# Patient Record
Sex: Female | Born: 1935
Health system: Southern US, Community
[De-identification: ages and names within clinical notes are randomized; demographics above are authoritative.]

## PROBLEM LIST (undated history)

## (undated) DIAGNOSIS — Z9081 Acquired absence of spleen: Secondary | ICD-10-CM

## (undated) DIAGNOSIS — I700 Atherosclerosis of aorta: Secondary | ICD-10-CM

## (undated) DIAGNOSIS — E669 Obesity, unspecified: Secondary | ICD-10-CM

## (undated) DIAGNOSIS — D473 Essential (hemorrhagic) thrombocythemia: Secondary | ICD-10-CM

## (undated) DIAGNOSIS — H8109 Meniere's disease, unspecified ear: Secondary | ICD-10-CM

## (undated) DIAGNOSIS — F329 Major depressive disorder, single episode, unspecified: Secondary | ICD-10-CM

## (undated) DIAGNOSIS — C8307 Small cell B-cell lymphoma, spleen: Secondary | ICD-10-CM

## (undated) DIAGNOSIS — T4145XA Adverse effect of unspecified anesthetic, initial encounter: Secondary | ICD-10-CM

## (undated) DIAGNOSIS — C8590 Non-Hodgkin lymphoma, unspecified, unspecified site: Secondary | ICD-10-CM

## (undated) DIAGNOSIS — I5020 Unspecified systolic (congestive) heart failure: Secondary | ICD-10-CM

## (undated) DIAGNOSIS — I739 Peripheral vascular disease, unspecified: Secondary | ICD-10-CM

## (undated) DIAGNOSIS — I10 Essential (primary) hypertension: Secondary | ICD-10-CM

## (undated) DIAGNOSIS — T8859XA Other complications of anesthesia, initial encounter: Secondary | ICD-10-CM

## (undated) DIAGNOSIS — D509 Iron deficiency anemia, unspecified: Secondary | ICD-10-CM

## (undated) DIAGNOSIS — M25552 Pain in left hip: Secondary | ICD-10-CM

## (undated) DIAGNOSIS — I5022 Chronic systolic (congestive) heart failure: Secondary | ICD-10-CM

## (undated) DIAGNOSIS — E876 Hypokalemia: Secondary | ICD-10-CM

## (undated) DIAGNOSIS — F419 Anxiety disorder, unspecified: Secondary | ICD-10-CM

## (undated) DIAGNOSIS — E7800 Pure hypercholesterolemia, unspecified: Secondary | ICD-10-CM

## (undated) DIAGNOSIS — F32A Depression, unspecified: Secondary | ICD-10-CM

## (undated) HISTORY — DX: Peripheral vascular disease, unspecified: I73.9

## (undated) HISTORY — DX: Depression, unspecified: F32.A

## (undated) HISTORY — DX: Non-Hodgkin lymphoma, unspecified, unspecified site: C85.90

## (undated) HISTORY — DX: Essential (primary) hypertension: I10

## (undated) HISTORY — DX: Essential (hemorrhagic) thrombocythemia: D47.3

## (undated) HISTORY — DX: Pain in left hip: M25.552

## (undated) HISTORY — PX: PORT-A-CATH REMOVAL: SHX5289

## (undated) HISTORY — DX: Acquired absence of spleen: Z90.81

## (undated) HISTORY — DX: Pure hypercholesterolemia, unspecified: E78.00

## (undated) HISTORY — DX: Meniere's disease, unspecified ear: H81.09

## (undated) HISTORY — DX: Anxiety disorder, unspecified: F41.9

## (undated) HISTORY — DX: Hypokalemia: E87.6

## (undated) HISTORY — DX: Iron deficiency anemia, unspecified: D50.9

## (undated) HISTORY — DX: Unspecified systolic (congestive) heart failure: I50.20

## (undated) HISTORY — DX: Major depressive disorder, single episode, unspecified: F32.9

## (undated) HISTORY — DX: Small cell b-cell lymphoma, spleen: C83.07

## (undated) HISTORY — DX: Obesity, unspecified: E66.9

---

## 2000-06-09 ENCOUNTER — Encounter: Admission: RE | Admit: 2000-06-09 | Discharge: 2000-06-09 | Payer: Self-pay | Admitting: Oncology

## 2000-07-19 ENCOUNTER — Encounter (HOSPITAL_COMMUNITY): Admission: RE | Admit: 2000-07-19 | Discharge: 2000-08-18 | Payer: Self-pay | Admitting: Oncology

## 2000-07-19 ENCOUNTER — Encounter: Admission: RE | Admit: 2000-07-19 | Discharge: 2000-07-19 | Payer: Self-pay | Admitting: Oncology

## 2000-08-08 ENCOUNTER — Ambulatory Visit (HOSPITAL_COMMUNITY): Admission: RE | Admit: 2000-08-08 | Discharge: 2000-08-08 | Payer: Self-pay | Admitting: General Surgery

## 2000-08-08 ENCOUNTER — Encounter: Payer: Self-pay | Admitting: General Surgery

## 2000-08-31 ENCOUNTER — Encounter (INDEPENDENT_AMBULATORY_CARE_PROVIDER_SITE_OTHER): Payer: Self-pay | Admitting: Family Medicine

## 2000-08-31 ENCOUNTER — Ambulatory Visit (HOSPITAL_COMMUNITY): Admission: RE | Admit: 2000-08-31 | Discharge: 2000-08-31 | Payer: Self-pay | Admitting: General Surgery

## 2000-09-02 ENCOUNTER — Encounter (HOSPITAL_COMMUNITY): Admission: RE | Admit: 2000-09-02 | Discharge: 2000-10-02 | Payer: Self-pay | Admitting: Oncology

## 2000-09-02 ENCOUNTER — Encounter: Admission: RE | Admit: 2000-09-02 | Discharge: 2000-09-02 | Payer: Self-pay | Admitting: Oncology

## 2000-10-05 ENCOUNTER — Ambulatory Visit (HOSPITAL_COMMUNITY): Admission: RE | Admit: 2000-10-05 | Discharge: 2000-10-05 | Payer: Self-pay | Admitting: General Surgery

## 2000-10-14 ENCOUNTER — Encounter: Admission: RE | Admit: 2000-10-14 | Discharge: 2000-10-14 | Payer: Self-pay | Admitting: Oncology

## 2000-11-14 ENCOUNTER — Encounter (HOSPITAL_COMMUNITY): Admission: RE | Admit: 2000-11-14 | Discharge: 2000-12-14 | Payer: Self-pay | Admitting: Oncology

## 2000-11-14 ENCOUNTER — Encounter: Admission: RE | Admit: 2000-11-14 | Discharge: 2000-11-14 | Payer: Self-pay | Admitting: Oncology

## 2000-12-22 ENCOUNTER — Encounter (HOSPITAL_COMMUNITY): Admission: RE | Admit: 2000-12-22 | Discharge: 2001-01-21 | Payer: Self-pay | Admitting: Oncology

## 2001-01-24 ENCOUNTER — Encounter: Admission: RE | Admit: 2001-01-24 | Discharge: 2001-01-24 | Payer: Self-pay | Admitting: Oncology

## 2001-03-07 ENCOUNTER — Encounter (HOSPITAL_COMMUNITY): Admission: RE | Admit: 2001-03-07 | Discharge: 2001-04-06 | Payer: Self-pay | Admitting: Oncology

## 2001-03-07 ENCOUNTER — Encounter: Admission: RE | Admit: 2001-03-07 | Discharge: 2001-03-07 | Payer: Self-pay | Admitting: Oncology

## 2001-05-08 ENCOUNTER — Encounter: Admission: RE | Admit: 2001-05-08 | Discharge: 2001-05-08 | Payer: Self-pay | Admitting: Oncology

## 2001-06-22 ENCOUNTER — Encounter: Admission: RE | Admit: 2001-06-22 | Discharge: 2001-06-22 | Payer: Self-pay | Admitting: Oncology

## 2001-08-03 ENCOUNTER — Encounter: Admission: RE | Admit: 2001-08-03 | Discharge: 2001-08-03 | Payer: Self-pay | Admitting: Oncology

## 2001-09-18 ENCOUNTER — Encounter (HOSPITAL_COMMUNITY): Admission: RE | Admit: 2001-09-18 | Discharge: 2001-10-18 | Payer: Self-pay | Admitting: Oncology

## 2001-09-18 ENCOUNTER — Encounter: Admission: RE | Admit: 2001-09-18 | Discharge: 2001-09-18 | Payer: Self-pay | Admitting: Oncology

## 2001-09-18 ENCOUNTER — Ambulatory Visit (HOSPITAL_COMMUNITY): Admission: RE | Admit: 2001-09-18 | Discharge: 2001-09-18 | Payer: Self-pay | Admitting: Oncology

## 2001-09-21 ENCOUNTER — Encounter (HOSPITAL_COMMUNITY): Payer: Self-pay | Admitting: Oncology

## 2001-10-30 ENCOUNTER — Encounter: Admission: RE | Admit: 2001-10-30 | Discharge: 2001-10-30 | Payer: Self-pay | Admitting: Oncology

## 2001-12-25 ENCOUNTER — Encounter: Admission: RE | Admit: 2001-12-25 | Discharge: 2001-12-25 | Payer: Self-pay | Admitting: Oncology

## 2002-02-05 ENCOUNTER — Encounter: Admission: RE | Admit: 2002-02-05 | Discharge: 2002-02-05 | Payer: Self-pay | Admitting: Oncology

## 2002-03-21 ENCOUNTER — Encounter (HOSPITAL_COMMUNITY): Admission: RE | Admit: 2002-03-21 | Discharge: 2002-04-20 | Payer: Self-pay | Admitting: Oncology

## 2002-03-21 ENCOUNTER — Encounter: Admission: RE | Admit: 2002-03-21 | Discharge: 2002-03-21 | Payer: Self-pay | Admitting: Oncology

## 2002-05-17 ENCOUNTER — Encounter: Admission: RE | Admit: 2002-05-17 | Discharge: 2002-05-17 | Payer: Self-pay | Admitting: Oncology

## 2002-06-28 ENCOUNTER — Encounter: Admission: RE | Admit: 2002-06-28 | Discharge: 2002-06-28 | Payer: Self-pay | Admitting: Oncology

## 2002-08-30 ENCOUNTER — Encounter (HOSPITAL_COMMUNITY): Admission: RE | Admit: 2002-08-30 | Discharge: 2002-09-29 | Payer: Self-pay | Admitting: Oncology

## 2002-08-30 ENCOUNTER — Encounter: Admission: RE | Admit: 2002-08-30 | Discharge: 2002-08-30 | Payer: Self-pay | Admitting: Oncology

## 2002-09-28 ENCOUNTER — Encounter (HOSPITAL_COMMUNITY): Payer: Self-pay | Admitting: Oncology

## 2002-10-11 ENCOUNTER — Encounter: Admission: RE | Admit: 2002-10-11 | Discharge: 2002-10-11 | Payer: Self-pay | Admitting: Oncology

## 2002-11-16 ENCOUNTER — Encounter: Admission: RE | Admit: 2002-11-16 | Discharge: 2002-11-16 | Payer: Self-pay | Admitting: Oncology

## 2002-12-28 ENCOUNTER — Encounter: Admission: RE | Admit: 2002-12-28 | Discharge: 2002-12-28 | Payer: Self-pay | Admitting: Oncology

## 2003-02-07 ENCOUNTER — Encounter: Admission: RE | Admit: 2003-02-07 | Discharge: 2003-02-07 | Payer: Self-pay | Admitting: Oncology

## 2003-03-21 ENCOUNTER — Encounter: Admission: RE | Admit: 2003-03-21 | Discharge: 2003-03-21 | Payer: Self-pay | Admitting: Oncology

## 2003-03-21 ENCOUNTER — Encounter (HOSPITAL_COMMUNITY): Admission: RE | Admit: 2003-03-21 | Discharge: 2003-04-20 | Payer: Self-pay | Admitting: Oncology

## 2003-05-02 ENCOUNTER — Encounter: Admission: RE | Admit: 2003-05-02 | Discharge: 2003-05-02 | Payer: Self-pay | Admitting: Oncology

## 2003-06-13 ENCOUNTER — Encounter: Admission: RE | Admit: 2003-06-13 | Discharge: 2003-06-13 | Payer: Self-pay | Admitting: Oncology

## 2003-07-23 ENCOUNTER — Encounter: Admission: RE | Admit: 2003-07-23 | Discharge: 2003-07-23 | Payer: Self-pay | Admitting: Oncology

## 2003-09-03 ENCOUNTER — Encounter (HOSPITAL_COMMUNITY): Admission: RE | Admit: 2003-09-03 | Discharge: 2003-10-03 | Payer: Self-pay | Admitting: Oncology

## 2003-09-03 ENCOUNTER — Encounter: Admission: RE | Admit: 2003-09-03 | Discharge: 2003-09-03 | Payer: Self-pay | Admitting: Oncology

## 2003-10-16 ENCOUNTER — Encounter (HOSPITAL_COMMUNITY): Admission: RE | Admit: 2003-10-16 | Discharge: 2003-11-15 | Payer: Self-pay | Admitting: Oncology

## 2003-11-29 ENCOUNTER — Encounter: Admission: RE | Admit: 2003-11-29 | Discharge: 2003-11-29 | Payer: Self-pay | Admitting: Oncology

## 2004-01-10 ENCOUNTER — Encounter: Admission: RE | Admit: 2004-01-10 | Discharge: 2004-01-10 | Payer: Self-pay | Admitting: Oncology

## 2004-01-10 ENCOUNTER — Ambulatory Visit (HOSPITAL_COMMUNITY): Payer: Self-pay | Admitting: Oncology

## 2004-02-21 ENCOUNTER — Encounter: Admission: RE | Admit: 2004-02-21 | Discharge: 2004-02-21 | Payer: Self-pay | Admitting: Oncology

## 2004-03-23 ENCOUNTER — Ambulatory Visit (HOSPITAL_COMMUNITY): Admission: RE | Admit: 2004-03-23 | Discharge: 2004-03-23 | Payer: Self-pay | Admitting: General Surgery

## 2004-04-01 ENCOUNTER — Encounter (HOSPITAL_COMMUNITY): Admission: RE | Admit: 2004-04-01 | Discharge: 2004-05-01 | Payer: Self-pay | Admitting: Oncology

## 2004-04-01 ENCOUNTER — Encounter: Admission: RE | Admit: 2004-04-01 | Discharge: 2004-04-01 | Payer: Self-pay | Admitting: Oncology

## 2004-04-01 ENCOUNTER — Ambulatory Visit (HOSPITAL_COMMUNITY): Payer: Self-pay | Admitting: Oncology

## 2004-05-28 ENCOUNTER — Ambulatory Visit (HOSPITAL_COMMUNITY): Payer: Self-pay | Admitting: Oncology

## 2004-05-28 ENCOUNTER — Encounter: Admission: RE | Admit: 2004-05-28 | Discharge: 2004-05-28 | Payer: Self-pay | Admitting: Oncology

## 2004-07-09 ENCOUNTER — Encounter: Admission: RE | Admit: 2004-07-09 | Discharge: 2004-07-09 | Payer: Self-pay | Admitting: Oncology

## 2004-08-10 ENCOUNTER — Ambulatory Visit (HOSPITAL_COMMUNITY): Admission: RE | Admit: 2004-08-10 | Discharge: 2004-08-10 | Payer: Self-pay | Admitting: Family Medicine

## 2004-08-20 ENCOUNTER — Encounter: Admission: RE | Admit: 2004-08-20 | Discharge: 2004-08-20 | Payer: Self-pay | Admitting: Oncology

## 2004-08-20 ENCOUNTER — Ambulatory Visit (HOSPITAL_COMMUNITY): Payer: Self-pay | Admitting: Oncology

## 2004-10-02 ENCOUNTER — Encounter: Admission: RE | Admit: 2004-10-02 | Discharge: 2004-10-02 | Payer: Self-pay | Admitting: Oncology

## 2004-11-13 ENCOUNTER — Encounter (HOSPITAL_COMMUNITY): Admission: RE | Admit: 2004-11-13 | Discharge: 2004-11-21 | Payer: Self-pay | Admitting: Oncology

## 2004-11-13 ENCOUNTER — Encounter: Admission: RE | Admit: 2004-11-13 | Discharge: 2004-11-21 | Payer: Self-pay | Admitting: Oncology

## 2004-11-13 ENCOUNTER — Ambulatory Visit (HOSPITAL_COMMUNITY): Payer: Self-pay | Admitting: Oncology

## 2004-11-24 ENCOUNTER — Encounter (HOSPITAL_COMMUNITY): Admission: RE | Admit: 2004-11-24 | Discharge: 2004-12-24 | Payer: Self-pay | Admitting: Oncology

## 2004-11-24 ENCOUNTER — Encounter: Admission: RE | Admit: 2004-11-24 | Discharge: 2004-11-24 | Payer: Self-pay | Admitting: Oncology

## 2005-01-07 ENCOUNTER — Ambulatory Visit (HOSPITAL_COMMUNITY): Payer: Self-pay | Admitting: Oncology

## 2005-01-07 ENCOUNTER — Encounter: Admission: RE | Admit: 2005-01-07 | Discharge: 2005-01-07 | Payer: Self-pay | Admitting: Oncology

## 2005-02-18 ENCOUNTER — Encounter: Admission: RE | Admit: 2005-02-18 | Discharge: 2005-02-18 | Payer: Self-pay | Admitting: Oncology

## 2005-04-01 ENCOUNTER — Ambulatory Visit (HOSPITAL_COMMUNITY): Payer: Self-pay | Admitting: Oncology

## 2005-04-01 ENCOUNTER — Encounter: Admission: RE | Admit: 2005-04-01 | Discharge: 2005-04-01 | Payer: Self-pay | Admitting: Oncology

## 2005-05-13 ENCOUNTER — Encounter: Admission: RE | Admit: 2005-05-13 | Discharge: 2005-05-13 | Payer: Self-pay | Admitting: Oncology

## 2005-06-24 ENCOUNTER — Ambulatory Visit (HOSPITAL_COMMUNITY): Payer: Self-pay | Admitting: Oncology

## 2005-06-24 ENCOUNTER — Encounter: Admission: RE | Admit: 2005-06-24 | Discharge: 2005-06-24 | Payer: Self-pay | Admitting: Oncology

## 2005-06-24 ENCOUNTER — Encounter (HOSPITAL_COMMUNITY): Admission: RE | Admit: 2005-06-24 | Discharge: 2005-07-24 | Payer: Self-pay | Admitting: Oncology

## 2005-06-28 ENCOUNTER — Encounter (INDEPENDENT_AMBULATORY_CARE_PROVIDER_SITE_OTHER): Payer: Self-pay | Admitting: Family Medicine

## 2005-08-05 ENCOUNTER — Encounter: Admission: RE | Admit: 2005-08-05 | Discharge: 2005-08-05 | Payer: Self-pay | Admitting: Oncology

## 2005-09-16 ENCOUNTER — Encounter: Admission: RE | Admit: 2005-09-16 | Discharge: 2005-09-16 | Payer: Self-pay | Admitting: Oncology

## 2005-09-16 ENCOUNTER — Ambulatory Visit (HOSPITAL_COMMUNITY): Payer: Self-pay | Admitting: Oncology

## 2005-10-01 ENCOUNTER — Ambulatory Visit: Payer: Self-pay | Admitting: Family Medicine

## 2005-10-27 ENCOUNTER — Encounter: Admission: RE | Admit: 2005-10-27 | Discharge: 2005-11-19 | Payer: Self-pay | Admitting: Oncology

## 2005-11-03 ENCOUNTER — Ambulatory Visit (HOSPITAL_COMMUNITY): Payer: Self-pay | Admitting: Family Medicine

## 2005-11-03 ENCOUNTER — Encounter (HOSPITAL_COMMUNITY): Admission: RE | Admit: 2005-11-03 | Discharge: 2005-11-19 | Payer: Self-pay | Admitting: Oncology

## 2005-11-03 LAB — CONVERTED CEMR LAB
Blood Glucose, Fasting: 97 mg/dL
RBC count: 4.33 10*6/uL
WBC, blood: 8.2 10*3/uL

## 2005-11-22 ENCOUNTER — Ambulatory Visit: Payer: Self-pay | Admitting: Family Medicine

## 2005-11-24 ENCOUNTER — Ambulatory Visit: Payer: Self-pay | Admitting: Family Medicine

## 2005-11-25 ENCOUNTER — Encounter (INDEPENDENT_AMBULATORY_CARE_PROVIDER_SITE_OTHER): Payer: Self-pay | Admitting: Family Medicine

## 2005-11-25 ENCOUNTER — Ambulatory Visit (HOSPITAL_COMMUNITY): Admission: RE | Admit: 2005-11-25 | Discharge: 2005-11-25 | Payer: Self-pay | Admitting: Family Medicine

## 2005-11-25 LAB — CONVERTED CEMR LAB

## 2005-11-28 ENCOUNTER — Encounter (INDEPENDENT_AMBULATORY_CARE_PROVIDER_SITE_OTHER): Payer: Self-pay | Admitting: Family Medicine

## 2005-12-08 ENCOUNTER — Encounter: Admission: RE | Admit: 2005-12-08 | Discharge: 2005-12-08 | Payer: Self-pay | Admitting: Oncology

## 2005-12-08 ENCOUNTER — Ambulatory Visit (HOSPITAL_COMMUNITY): Payer: Self-pay | Admitting: Oncology

## 2005-12-09 ENCOUNTER — Ambulatory Visit: Payer: Self-pay | Admitting: Family Medicine

## 2005-12-23 ENCOUNTER — Ambulatory Visit: Payer: Self-pay | Admitting: Family Medicine

## 2006-01-11 ENCOUNTER — Encounter: Payer: Self-pay | Admitting: Family Medicine

## 2006-01-11 DIAGNOSIS — E785 Hyperlipidemia, unspecified: Secondary | ICD-10-CM | POA: Insufficient documentation

## 2006-01-11 DIAGNOSIS — I739 Peripheral vascular disease, unspecified: Secondary | ICD-10-CM | POA: Insufficient documentation

## 2006-01-11 DIAGNOSIS — L821 Other seborrheic keratosis: Secondary | ICD-10-CM | POA: Insufficient documentation

## 2006-01-11 DIAGNOSIS — D473 Essential (hemorrhagic) thrombocythemia: Secondary | ICD-10-CM

## 2006-01-11 DIAGNOSIS — J309 Allergic rhinitis, unspecified: Secondary | ICD-10-CM | POA: Insufficient documentation

## 2006-01-11 DIAGNOSIS — I10 Essential (primary) hypertension: Secondary | ICD-10-CM | POA: Insufficient documentation

## 2006-01-11 DIAGNOSIS — G5600 Carpal tunnel syndrome, unspecified upper limb: Secondary | ICD-10-CM | POA: Insufficient documentation

## 2006-01-11 DIAGNOSIS — D471 Chronic myeloproliferative disease: Secondary | ICD-10-CM | POA: Insufficient documentation

## 2006-01-11 HISTORY — DX: Essential (hemorrhagic) thrombocythemia: D47.3

## 2006-01-19 ENCOUNTER — Encounter (HOSPITAL_COMMUNITY): Admission: RE | Admit: 2006-01-19 | Discharge: 2006-02-18 | Payer: Self-pay | Admitting: Oncology

## 2006-02-03 ENCOUNTER — Ambulatory Visit: Payer: Self-pay | Admitting: Family Medicine

## 2006-02-11 ENCOUNTER — Encounter (INDEPENDENT_AMBULATORY_CARE_PROVIDER_SITE_OTHER): Payer: Self-pay | Admitting: Family Medicine

## 2006-02-11 ENCOUNTER — Ambulatory Visit (HOSPITAL_COMMUNITY): Payer: Self-pay | Admitting: Oncology

## 2006-02-21 ENCOUNTER — Ambulatory Visit: Payer: Self-pay | Admitting: Family Medicine

## 2006-02-22 ENCOUNTER — Encounter (INDEPENDENT_AMBULATORY_CARE_PROVIDER_SITE_OTHER): Payer: Self-pay | Admitting: Family Medicine

## 2006-02-23 ENCOUNTER — Encounter (INDEPENDENT_AMBULATORY_CARE_PROVIDER_SITE_OTHER): Payer: Self-pay | Admitting: Family Medicine

## 2006-02-28 ENCOUNTER — Ambulatory Visit: Payer: Self-pay | Admitting: Family Medicine

## 2006-04-05 ENCOUNTER — Ambulatory Visit (HOSPITAL_COMMUNITY): Payer: Self-pay | Admitting: Oncology

## 2006-04-05 ENCOUNTER — Ambulatory Visit: Payer: Self-pay | Admitting: Family Medicine

## 2006-04-06 ENCOUNTER — Encounter (INDEPENDENT_AMBULATORY_CARE_PROVIDER_SITE_OTHER): Payer: Self-pay | Admitting: Family Medicine

## 2006-04-06 ENCOUNTER — Telehealth (INDEPENDENT_AMBULATORY_CARE_PROVIDER_SITE_OTHER): Payer: Self-pay | Admitting: Family Medicine

## 2006-04-06 LAB — CONVERTED CEMR LAB
ALT: 12 units/L (ref 0–35)
AST: 13 units/L (ref 0–37)
Albumin: 4.3 g/dL (ref 3.5–5.2)
Alkaline Phosphatase: 81 units/L (ref 39–117)
BUN: 12 mg/dL (ref 6–23)
CO2: 24 meq/L (ref 19–32)
Calcium: 9.6 mg/dL (ref 8.4–10.5)
Chloride: 103 meq/L (ref 96–112)
Cholesterol: 154 mg/dL (ref 0–200)
Creatinine, Ser: 0.76 mg/dL (ref 0.40–1.20)
Glucose, Bld: 48 mg/dL — ABNORMAL LOW (ref 70–99)
HDL: 43 mg/dL (ref >39)
LDL Cholesterol: 91 mg/dL (ref 0–99)
Potassium: 4.5 meq/L (ref 3.5–5.3)
Sodium: 142 meq/L (ref 135–145)
Total Bilirubin: 0.4 mg/dL (ref 0.3–1.2)
Total CHOL/HDL Ratio: 3.6
Total Protein: 7.3 g/dL (ref 6.0–8.3)
Triglycerides: 101 mg/dL (ref <150)
VLDL: 20 mg/dL (ref 0–40)

## 2006-04-19 ENCOUNTER — Ambulatory Visit: Payer: Self-pay | Admitting: Family Medicine

## 2006-04-19 LAB — CONVERTED CEMR LAB
Cholesterol, target level: 200 mg/dL
HDL goal, serum: 40 mg/dL
LDL Goal: 100 mg/dL

## 2006-04-20 ENCOUNTER — Encounter (INDEPENDENT_AMBULATORY_CARE_PROVIDER_SITE_OTHER): Payer: Self-pay | Admitting: Family Medicine

## 2006-04-21 ENCOUNTER — Ambulatory Visit (HOSPITAL_COMMUNITY): Admission: RE | Admit: 2006-04-21 | Discharge: 2006-04-21 | Payer: Self-pay | Admitting: Family Medicine

## 2006-04-22 ENCOUNTER — Encounter (INDEPENDENT_AMBULATORY_CARE_PROVIDER_SITE_OTHER): Payer: Self-pay | Admitting: Family Medicine

## 2006-05-23 ENCOUNTER — Encounter (INDEPENDENT_AMBULATORY_CARE_PROVIDER_SITE_OTHER): Payer: Self-pay | Admitting: Family Medicine

## 2006-05-27 ENCOUNTER — Ambulatory Visit: Payer: Self-pay | Admitting: Family Medicine

## 2006-06-13 ENCOUNTER — Encounter (INDEPENDENT_AMBULATORY_CARE_PROVIDER_SITE_OTHER): Payer: Self-pay | Admitting: Family Medicine

## 2006-06-13 ENCOUNTER — Ambulatory Visit: Admission: RE | Admit: 2006-06-13 | Discharge: 2006-06-13 | Payer: Self-pay | Admitting: Family Medicine

## 2006-06-16 ENCOUNTER — Ambulatory Visit: Payer: Self-pay | Admitting: Pulmonary Disease

## 2006-06-27 ENCOUNTER — Telehealth (INDEPENDENT_AMBULATORY_CARE_PROVIDER_SITE_OTHER): Payer: Self-pay | Admitting: Family Medicine

## 2006-06-30 ENCOUNTER — Ambulatory Visit (HOSPITAL_COMMUNITY): Payer: Self-pay | Admitting: Oncology

## 2006-07-04 ENCOUNTER — Encounter (INDEPENDENT_AMBULATORY_CARE_PROVIDER_SITE_OTHER): Payer: Self-pay | Admitting: Family Medicine

## 2006-07-07 ENCOUNTER — Encounter (INDEPENDENT_AMBULATORY_CARE_PROVIDER_SITE_OTHER): Payer: Self-pay | Admitting: Family Medicine

## 2006-07-08 ENCOUNTER — Ambulatory Visit: Payer: Self-pay | Admitting: Family Medicine

## 2006-07-11 ENCOUNTER — Telehealth (INDEPENDENT_AMBULATORY_CARE_PROVIDER_SITE_OTHER): Payer: Self-pay | Admitting: Family Medicine

## 2006-08-01 ENCOUNTER — Encounter (INDEPENDENT_AMBULATORY_CARE_PROVIDER_SITE_OTHER): Payer: Self-pay | Admitting: Family Medicine

## 2006-08-02 ENCOUNTER — Emergency Department (HOSPITAL_COMMUNITY): Admission: EM | Admit: 2006-08-02 | Discharge: 2006-08-02 | Payer: Self-pay | Admitting: Emergency Medicine

## 2006-08-10 ENCOUNTER — Encounter (HOSPITAL_COMMUNITY): Admission: RE | Admit: 2006-08-10 | Discharge: 2006-09-09 | Payer: Self-pay | Admitting: Oncology

## 2006-08-10 ENCOUNTER — Encounter (INDEPENDENT_AMBULATORY_CARE_PROVIDER_SITE_OTHER): Payer: Self-pay | Admitting: Family Medicine

## 2006-08-16 ENCOUNTER — Encounter (INDEPENDENT_AMBULATORY_CARE_PROVIDER_SITE_OTHER): Payer: Self-pay | Admitting: Family Medicine

## 2006-08-19 ENCOUNTER — Ambulatory Visit: Payer: Self-pay | Admitting: Family Medicine

## 2006-08-31 ENCOUNTER — Ambulatory Visit (HOSPITAL_COMMUNITY): Payer: Self-pay | Admitting: Oncology

## 2006-10-14 ENCOUNTER — Ambulatory Visit: Payer: Self-pay | Admitting: Family Medicine

## 2006-10-15 LAB — CONVERTED CEMR LAB
ALT: 11 units/L (ref 0–35)
AST: 15 units/L (ref 0–37)
Albumin: 4.3 g/dL (ref 3.5–5.2)
Alkaline Phosphatase: 74 units/L (ref 39–117)
BUN: 17 mg/dL (ref 6–23)
Basophils Absolute: 0 10*3/uL (ref 0.0–0.1)
Basophils Relative: 0 % (ref 0–1)
CO2: 25 meq/L (ref 19–32)
Calcium: 9.9 mg/dL (ref 8.4–10.5)
Chloride: 104 meq/L (ref 96–112)
Creatinine, Ser: 0.98 mg/dL (ref 0.40–1.20)
Eosinophils Absolute: 0.2 10*3/uL (ref 0.0–0.7)
Eosinophils Relative: 2 % (ref 0–5)
Glucose, Bld: 110 mg/dL — ABNORMAL HIGH (ref 70–99)
HCT: 40.9 % (ref 36.0–46.0)
Hemoglobin: 13.2 g/dL (ref 12.0–15.0)
Lymphocytes Relative: 46 % (ref 12–46)
Lymphs Abs: 4.5 10*3/uL — ABNORMAL HIGH (ref 0.7–3.3)
MCHC: 32.3 g/dL (ref 30.0–36.0)
MCV: 94 fL (ref 78.0–100.0)
Monocytes Absolute: 1 10*3/uL — ABNORMAL HIGH (ref 0.2–0.7)
Monocytes Relative: 10 % (ref 3–11)
Neutro Abs: 4.1 10*3/uL (ref 1.7–7.7)
Neutrophils Relative %: 42 % — ABNORMAL LOW (ref 43–77)
Platelets: 478 10*3/uL — ABNORMAL HIGH (ref 150–400)
Potassium: 4.3 meq/L (ref 3.5–5.3)
RBC: 4.35 M/uL (ref 3.87–5.11)
RDW: 15.7 % — ABNORMAL HIGH (ref 11.5–14.0)
Sodium: 141 meq/L (ref 135–145)
Total Bilirubin: 0.5 mg/dL (ref 0.3–1.2)
Total Protein: 7 g/dL (ref 6.0–8.3)
WBC: 9.8 10*3/uL (ref 4.0–10.5)

## 2006-10-17 ENCOUNTER — Telehealth (INDEPENDENT_AMBULATORY_CARE_PROVIDER_SITE_OTHER): Payer: Self-pay | Admitting: *Deleted

## 2006-10-18 ENCOUNTER — Encounter (INDEPENDENT_AMBULATORY_CARE_PROVIDER_SITE_OTHER): Payer: Self-pay | Admitting: Family Medicine

## 2006-10-31 ENCOUNTER — Ambulatory Visit: Payer: Self-pay | Admitting: Family Medicine

## 2006-11-02 ENCOUNTER — Ambulatory Visit (HOSPITAL_COMMUNITY): Payer: Self-pay | Admitting: Oncology

## 2006-11-08 ENCOUNTER — Encounter (INDEPENDENT_AMBULATORY_CARE_PROVIDER_SITE_OTHER): Payer: Self-pay | Admitting: Family Medicine

## 2006-11-25 ENCOUNTER — Ambulatory Visit: Payer: Self-pay | Admitting: Family Medicine

## 2006-12-13 ENCOUNTER — Ambulatory Visit (HOSPITAL_COMMUNITY): Admission: RE | Admit: 2006-12-13 | Discharge: 2006-12-13 | Payer: Self-pay | Admitting: Family Medicine

## 2006-12-16 ENCOUNTER — Telehealth (INDEPENDENT_AMBULATORY_CARE_PROVIDER_SITE_OTHER): Payer: Self-pay | Admitting: *Deleted

## 2006-12-19 ENCOUNTER — Ambulatory Visit: Payer: Self-pay | Admitting: Family Medicine

## 2006-12-20 ENCOUNTER — Telehealth (INDEPENDENT_AMBULATORY_CARE_PROVIDER_SITE_OTHER): Payer: Self-pay | Admitting: *Deleted

## 2006-12-20 ENCOUNTER — Encounter (INDEPENDENT_AMBULATORY_CARE_PROVIDER_SITE_OTHER): Payer: Self-pay | Admitting: Family Medicine

## 2006-12-22 ENCOUNTER — Encounter (INDEPENDENT_AMBULATORY_CARE_PROVIDER_SITE_OTHER): Payer: Self-pay | Admitting: Family Medicine

## 2006-12-23 ENCOUNTER — Encounter (INDEPENDENT_AMBULATORY_CARE_PROVIDER_SITE_OTHER): Payer: Self-pay | Admitting: Family Medicine

## 2006-12-27 ENCOUNTER — Encounter (INDEPENDENT_AMBULATORY_CARE_PROVIDER_SITE_OTHER): Payer: Self-pay | Admitting: Family Medicine

## 2006-12-27 ENCOUNTER — Ambulatory Visit (HOSPITAL_COMMUNITY): Admission: RE | Admit: 2006-12-27 | Discharge: 2006-12-27 | Payer: Self-pay | Admitting: Family Medicine

## 2006-12-29 ENCOUNTER — Encounter (INDEPENDENT_AMBULATORY_CARE_PROVIDER_SITE_OTHER): Payer: Self-pay | Admitting: Family Medicine

## 2007-01-02 ENCOUNTER — Encounter (INDEPENDENT_AMBULATORY_CARE_PROVIDER_SITE_OTHER): Payer: Self-pay | Admitting: Family Medicine

## 2007-01-02 ENCOUNTER — Ambulatory Visit: Payer: Self-pay | Admitting: Internal Medicine

## 2007-01-03 ENCOUNTER — Encounter (INDEPENDENT_AMBULATORY_CARE_PROVIDER_SITE_OTHER): Payer: Self-pay | Admitting: Family Medicine

## 2007-01-25 ENCOUNTER — Encounter (HOSPITAL_COMMUNITY): Admission: RE | Admit: 2007-01-25 | Discharge: 2007-02-22 | Payer: Self-pay | Admitting: Oncology

## 2007-01-25 ENCOUNTER — Encounter (INDEPENDENT_AMBULATORY_CARE_PROVIDER_SITE_OTHER): Payer: Self-pay | Admitting: Family Medicine

## 2007-01-25 ENCOUNTER — Ambulatory Visit (HOSPITAL_COMMUNITY): Payer: Self-pay | Admitting: Oncology

## 2007-01-27 ENCOUNTER — Encounter (INDEPENDENT_AMBULATORY_CARE_PROVIDER_SITE_OTHER): Payer: Self-pay | Admitting: Family Medicine

## 2007-02-01 ENCOUNTER — Telehealth (INDEPENDENT_AMBULATORY_CARE_PROVIDER_SITE_OTHER): Payer: Self-pay | Admitting: *Deleted

## 2007-02-02 ENCOUNTER — Other Ambulatory Visit: Admission: RE | Admit: 2007-02-02 | Discharge: 2007-02-02 | Payer: Self-pay | Admitting: Family Medicine

## 2007-02-02 ENCOUNTER — Ambulatory Visit: Payer: Self-pay | Admitting: Family Medicine

## 2007-02-02 ENCOUNTER — Encounter (INDEPENDENT_AMBULATORY_CARE_PROVIDER_SITE_OTHER): Payer: Self-pay | Admitting: Family Medicine

## 2007-02-03 ENCOUNTER — Encounter (INDEPENDENT_AMBULATORY_CARE_PROVIDER_SITE_OTHER): Payer: Self-pay | Admitting: Family Medicine

## 2007-02-03 ENCOUNTER — Encounter (INDEPENDENT_AMBULATORY_CARE_PROVIDER_SITE_OTHER): Payer: Self-pay | Admitting: *Deleted

## 2007-02-03 LAB — CONVERTED CEMR LAB
Candida species: NEGATIVE
Gardnerella vaginalis: NEGATIVE
Trichomonal Vaginitis: POSITIVE — AB

## 2007-02-09 ENCOUNTER — Ambulatory Visit: Payer: Self-pay | Admitting: Family Medicine

## 2007-02-10 LAB — CONVERTED CEMR LAB
Hep A IgM: NEGATIVE
Hep B C IgM: NEGATIVE

## 2007-03-08 ENCOUNTER — Encounter (INDEPENDENT_AMBULATORY_CARE_PROVIDER_SITE_OTHER): Payer: Self-pay | Admitting: Family Medicine

## 2007-03-20 ENCOUNTER — Ambulatory Visit: Payer: Self-pay | Admitting: Family Medicine

## 2007-03-20 DIAGNOSIS — F411 Generalized anxiety disorder: Secondary | ICD-10-CM | POA: Insufficient documentation

## 2007-04-03 ENCOUNTER — Ambulatory Visit: Payer: Self-pay | Admitting: Family Medicine

## 2007-04-19 ENCOUNTER — Ambulatory Visit (HOSPITAL_COMMUNITY): Payer: Self-pay | Admitting: Oncology

## 2007-04-20 ENCOUNTER — Encounter (INDEPENDENT_AMBULATORY_CARE_PROVIDER_SITE_OTHER): Payer: Self-pay | Admitting: Family Medicine

## 2007-04-21 LAB — CONVERTED CEMR LAB
ALT: 12 units/L (ref 0–35)
Albumin: 4.2 g/dL (ref 3.5–5.2)
Basophils Absolute: 0 10*3/uL (ref 0.0–0.1)
CO2: 23 meq/L (ref 19–32)
Calcium: 9.8 mg/dL (ref 8.4–10.5)
Chloride: 105 meq/L (ref 96–112)
Cholesterol: 170 mg/dL (ref 0–200)
Glucose, Bld: 99 mg/dL (ref 70–99)
HCT: 41.9 % (ref 36.0–46.0)
Lymphocytes Relative: 50 % — ABNORMAL HIGH (ref 12–46)
Lymphs Abs: 4.6 10*3/uL — ABNORMAL HIGH (ref 0.7–4.0)
Neutro Abs: 3.3 10*3/uL (ref 1.7–7.7)
Neutrophils Relative %: 37 % — ABNORMAL LOW (ref 43–77)
Platelets: 458 10*3/uL — ABNORMAL HIGH (ref 150–400)
Potassium: 4.6 meq/L (ref 3.5–5.3)
RDW: 16.1 % — ABNORMAL HIGH (ref 11.5–15.5)
Sodium: 143 meq/L (ref 135–145)
Total Bilirubin: 0.5 mg/dL (ref 0.3–1.2)
Total Protein: 6.8 g/dL (ref 6.0–8.3)
Triglycerides: 73 mg/dL (ref <150)
VLDL: 15 mg/dL (ref 0–40)
WBC: 9.1 10*3/uL (ref 4.0–10.5)

## 2007-05-09 ENCOUNTER — Encounter (INDEPENDENT_AMBULATORY_CARE_PROVIDER_SITE_OTHER): Payer: Self-pay | Admitting: Family Medicine

## 2007-05-26 ENCOUNTER — Encounter (INDEPENDENT_AMBULATORY_CARE_PROVIDER_SITE_OTHER): Payer: Self-pay | Admitting: Family Medicine

## 2007-06-19 ENCOUNTER — Ambulatory Visit: Payer: Self-pay | Admitting: Family Medicine

## 2007-06-19 LAB — CONVERTED CEMR LAB
Glucose, Urine, Semiquant: NEGATIVE
Ketones, urine, test strip: NEGATIVE
pH: 5

## 2007-06-20 ENCOUNTER — Telehealth (INDEPENDENT_AMBULATORY_CARE_PROVIDER_SITE_OTHER): Payer: Self-pay | Admitting: *Deleted

## 2007-06-20 ENCOUNTER — Encounter (INDEPENDENT_AMBULATORY_CARE_PROVIDER_SITE_OTHER): Payer: Self-pay | Admitting: Family Medicine

## 2007-06-20 LAB — CONVERTED CEMR LAB
Bacteria, UA: NONE SEEN
HCT: 41.4 % (ref 36.0–46.0)
Lymphocytes Relative: 46 % (ref 12–46)
Lymphs Abs: 4.6 10*3/uL — ABNORMAL HIGH (ref 0.7–4.0)
Monocytes Relative: 10 % (ref 3–12)
Neutrophils Relative %: 43 % (ref 43–77)
Platelets: 469 10*3/uL — ABNORMAL HIGH (ref 150–400)
RBC: 4.42 M/uL (ref 3.87–5.11)
WBC, UA: NONE SEEN cells/hpf (ref <3)
WBC: 10 10*3/uL (ref 4.0–10.5)

## 2007-06-21 ENCOUNTER — Encounter (INDEPENDENT_AMBULATORY_CARE_PROVIDER_SITE_OTHER): Payer: Self-pay | Admitting: Family Medicine

## 2007-06-22 LAB — CONVERTED CEMR LAB
Lymphocytes Relative: 54 % — ABNORMAL HIGH (ref 12–46)
Lymphs Abs: 4.1 10*3/uL — ABNORMAL HIGH (ref 0.7–4.0)
Neutro Abs: 2.8 10*3/uL (ref 1.7–7.7)
Neutrophils Relative %: 37 % — ABNORMAL LOW (ref 43–77)
Platelets: 485 10*3/uL — ABNORMAL HIGH (ref 150–400)
WBC: 7.7 10*3/uL (ref 4.0–10.5)

## 2007-06-23 ENCOUNTER — Telehealth (INDEPENDENT_AMBULATORY_CARE_PROVIDER_SITE_OTHER): Payer: Self-pay | Admitting: *Deleted

## 2007-06-26 ENCOUNTER — Encounter (INDEPENDENT_AMBULATORY_CARE_PROVIDER_SITE_OTHER): Payer: Self-pay | Admitting: Family Medicine

## 2007-07-13 ENCOUNTER — Ambulatory Visit (HOSPITAL_COMMUNITY): Payer: Self-pay | Admitting: Oncology

## 2007-07-13 ENCOUNTER — Encounter (HOSPITAL_COMMUNITY): Admission: RE | Admit: 2007-07-13 | Discharge: 2007-08-12 | Payer: Self-pay | Admitting: Oncology

## 2007-07-28 ENCOUNTER — Encounter (INDEPENDENT_AMBULATORY_CARE_PROVIDER_SITE_OTHER): Payer: Self-pay | Admitting: Family Medicine

## 2007-09-19 ENCOUNTER — Ambulatory Visit: Payer: Self-pay | Admitting: Family Medicine

## 2007-09-19 ENCOUNTER — Telehealth (INDEPENDENT_AMBULATORY_CARE_PROVIDER_SITE_OTHER): Payer: Self-pay | Admitting: *Deleted

## 2007-10-05 ENCOUNTER — Ambulatory Visit (HOSPITAL_COMMUNITY): Payer: Self-pay | Admitting: Oncology

## 2007-10-10 ENCOUNTER — Encounter (INDEPENDENT_AMBULATORY_CARE_PROVIDER_SITE_OTHER): Payer: Self-pay | Admitting: Family Medicine

## 2007-10-11 LAB — CONVERTED CEMR LAB
Albumin: 3.7 g/dL (ref 3.5–5.2)
Alkaline Phosphatase: 58 units/L (ref 39–117)
BUN: 16 mg/dL (ref 6–23)
Eosinophils Absolute: 0.1 10*3/uL (ref 0.0–0.7)
Eosinophils Relative: 1 % (ref 0–5)
Glucose, Bld: 88 mg/dL (ref 70–99)
HCT: 35.7 % — ABNORMAL LOW (ref 36.0–46.0)
HDL: 41 mg/dL (ref >39)
Hemoglobin: 11.5 g/dL — ABNORMAL LOW (ref 12.0–15.0)
LDL Cholesterol: 76 mg/dL (ref 0–99)
Lymphs Abs: 4.1 10*3/uL — ABNORMAL HIGH (ref 0.7–4.0)
MCV: 93 fL (ref 78.0–100.0)
Monocytes Relative: 9 % (ref 3–12)
Neutrophils Relative %: 42 % — ABNORMAL LOW (ref 43–77)
Potassium: 4.3 meq/L (ref 3.5–5.3)
RBC: 3.84 M/uL — ABNORMAL LOW (ref 3.87–5.11)
Triglycerides: 78 mg/dL (ref <150)
WBC: 8.6 10*3/uL (ref 4.0–10.5)

## 2007-11-16 ENCOUNTER — Ambulatory Visit: Payer: Self-pay | Admitting: Family Medicine

## 2007-12-20 ENCOUNTER — Ambulatory Visit: Payer: Self-pay | Admitting: Family Medicine

## 2007-12-21 LAB — CONVERTED CEMR LAB
Eosinophils Relative: 2 % (ref 0–5)
HCT: 39.6 % (ref 36.0–46.0)
Hemoglobin: 12.3 g/dL (ref 12.0–15.0)
Lymphocytes Relative: 47 % — ABNORMAL HIGH (ref 12–46)
Lymphs Abs: 4.1 10*3/uL — ABNORMAL HIGH (ref 0.7–4.0)
Monocytes Absolute: 0.9 10*3/uL (ref 0.1–1.0)
Monocytes Relative: 10 % (ref 3–12)
RBC: 4.21 M/uL (ref 3.87–5.11)
Saturation Ratios: 21 % (ref 20–55)
WBC: 8.8 10*3/uL (ref 4.0–10.5)

## 2007-12-26 ENCOUNTER — Ambulatory Visit (HOSPITAL_COMMUNITY): Admission: RE | Admit: 2007-12-26 | Discharge: 2007-12-26 | Payer: Self-pay | Admitting: Family Medicine

## 2007-12-28 ENCOUNTER — Ambulatory Visit (HOSPITAL_COMMUNITY): Payer: Self-pay | Admitting: Oncology

## 2008-01-30 ENCOUNTER — Encounter (HOSPITAL_COMMUNITY): Admission: RE | Admit: 2008-01-30 | Discharge: 2008-02-21 | Payer: Self-pay | Admitting: Oncology

## 2008-02-02 ENCOUNTER — Encounter (INDEPENDENT_AMBULATORY_CARE_PROVIDER_SITE_OTHER): Payer: Self-pay | Admitting: Family Medicine

## 2008-03-12 ENCOUNTER — Ambulatory Visit (HOSPITAL_COMMUNITY): Payer: Self-pay | Admitting: Oncology

## 2008-03-20 ENCOUNTER — Ambulatory Visit: Payer: Self-pay | Admitting: Family Medicine

## 2008-04-26 ENCOUNTER — Encounter (INDEPENDENT_AMBULATORY_CARE_PROVIDER_SITE_OTHER): Payer: Self-pay | Admitting: Family Medicine

## 2008-06-06 ENCOUNTER — Ambulatory Visit (HOSPITAL_COMMUNITY): Payer: Self-pay | Admitting: Oncology

## 2008-06-06 ENCOUNTER — Ambulatory Visit: Payer: Self-pay | Admitting: Family Medicine

## 2008-06-07 ENCOUNTER — Encounter (INDEPENDENT_AMBULATORY_CARE_PROVIDER_SITE_OTHER): Payer: Self-pay | Admitting: Family Medicine

## 2008-06-07 LAB — CONVERTED CEMR LAB
Albumin: 4 g/dL (ref 3.5–5.2)
Alkaline Phosphatase: 68 units/L (ref 39–117)
BUN: 13 mg/dL (ref 6–23)
Basophils Absolute: 0 10*3/uL (ref 0.0–0.1)
Basophils Relative: 0 % (ref 0–1)
CO2: 25 meq/L (ref 19–32)
Eosinophils Relative: 1 % (ref 0–5)
Glucose, Bld: 92 mg/dL (ref 70–99)
HCT: 37.4 % (ref 36.0–46.0)
Hemoglobin: 11.9 g/dL — ABNORMAL LOW (ref 12.0–15.0)
Iron: 78 ug/dL (ref 42–145)
MCHC: 31.8 g/dL (ref 30.0–36.0)
Monocytes Absolute: 1 10*3/uL (ref 0.1–1.0)
Monocytes Relative: 11 % (ref 3–12)
Potassium: 4.1 meq/L (ref 3.5–5.3)
RDW: 16 % — ABNORMAL HIGH (ref 11.5–15.5)
TIBC: 390 ug/dL (ref 250–470)
Total Bilirubin: 0.5 mg/dL (ref 0.3–1.2)
UIBC: 312 ug/dL

## 2008-06-10 ENCOUNTER — Telehealth (INDEPENDENT_AMBULATORY_CARE_PROVIDER_SITE_OTHER): Payer: Self-pay | Admitting: *Deleted

## 2008-06-12 ENCOUNTER — Ambulatory Visit: Payer: Self-pay | Admitting: Family Medicine

## 2008-06-12 DIAGNOSIS — D509 Iron deficiency anemia, unspecified: Secondary | ICD-10-CM | POA: Insufficient documentation

## 2008-06-12 LAB — CONVERTED CEMR LAB: Hemoglobin: 11.8 g/dL

## 2008-06-18 ENCOUNTER — Encounter (INDEPENDENT_AMBULATORY_CARE_PROVIDER_SITE_OTHER): Payer: Self-pay | Admitting: *Deleted

## 2008-06-27 ENCOUNTER — Encounter (INDEPENDENT_AMBULATORY_CARE_PROVIDER_SITE_OTHER): Payer: Self-pay | Admitting: Family Medicine

## 2008-07-16 ENCOUNTER — Telehealth (INDEPENDENT_AMBULATORY_CARE_PROVIDER_SITE_OTHER): Payer: Self-pay | Admitting: *Deleted

## 2008-07-25 ENCOUNTER — Ambulatory Visit: Payer: Self-pay | Admitting: Family Medicine

## 2008-08-01 ENCOUNTER — Encounter (HOSPITAL_COMMUNITY): Admission: RE | Admit: 2008-08-01 | Discharge: 2008-08-31 | Payer: Self-pay | Admitting: Oncology

## 2008-08-06 ENCOUNTER — Ambulatory Visit (HOSPITAL_COMMUNITY): Payer: Self-pay | Admitting: Oncology

## 2008-08-06 ENCOUNTER — Encounter (INDEPENDENT_AMBULATORY_CARE_PROVIDER_SITE_OTHER): Payer: Self-pay | Admitting: Family Medicine

## 2008-08-08 ENCOUNTER — Ambulatory Visit: Payer: Self-pay | Admitting: Family Medicine

## 2008-08-08 DIAGNOSIS — K219 Gastro-esophageal reflux disease without esophagitis: Secondary | ICD-10-CM | POA: Insufficient documentation

## 2008-08-12 ENCOUNTER — Telehealth (INDEPENDENT_AMBULATORY_CARE_PROVIDER_SITE_OTHER): Payer: Self-pay | Admitting: Family Medicine

## 2008-08-13 ENCOUNTER — Other Ambulatory Visit (HOSPITAL_COMMUNITY): Payer: Self-pay | Admitting: Oncology

## 2008-08-20 ENCOUNTER — Ambulatory Visit: Payer: Self-pay | Admitting: Family Medicine

## 2008-08-28 ENCOUNTER — Encounter (INDEPENDENT_AMBULATORY_CARE_PROVIDER_SITE_OTHER): Payer: Self-pay | Admitting: *Deleted

## 2008-09-17 ENCOUNTER — Ambulatory Visit: Payer: Self-pay | Admitting: Family Medicine

## 2008-09-18 LAB — CONVERTED CEMR LAB
Basophils Absolute: 0 10*3/uL (ref 0.0–0.1)
Basophils Relative: 0 % (ref 0–1)
Eosinophils Absolute: 0.1 10*3/uL (ref 0.0–0.7)
Eosinophils Relative: 1 % (ref 0–5)
Hemoglobin: 13.1 g/dL (ref 12.0–15.0)
MCHC: 33.2 g/dL (ref 30.0–36.0)
MCV: 88.3 fL (ref 78.0–100.0)
Monocytes Absolute: 0.8 10*3/uL (ref 0.1–1.0)
Monocytes Relative: 11 % (ref 3–12)
Neutro Abs: 2.8 10*3/uL (ref 1.7–7.7)
RDW: 15.7 % — ABNORMAL HIGH (ref 11.5–15.5)

## 2008-09-24 ENCOUNTER — Ambulatory Visit (HOSPITAL_COMMUNITY): Payer: Self-pay | Admitting: Oncology

## 2008-10-18 ENCOUNTER — Ambulatory Visit: Payer: Self-pay | Admitting: Internal Medicine

## 2008-10-21 ENCOUNTER — Encounter: Payer: Self-pay | Admitting: Internal Medicine

## 2008-10-22 ENCOUNTER — Encounter: Payer: Self-pay | Admitting: Internal Medicine

## 2008-11-01 ENCOUNTER — Ambulatory Visit: Payer: Self-pay | Admitting: Internal Medicine

## 2008-11-01 ENCOUNTER — Encounter: Payer: Self-pay | Admitting: Internal Medicine

## 2008-11-01 ENCOUNTER — Ambulatory Visit (HOSPITAL_COMMUNITY): Admission: RE | Admit: 2008-11-01 | Discharge: 2008-11-01 | Payer: Self-pay | Admitting: Internal Medicine

## 2008-11-05 ENCOUNTER — Encounter: Payer: Self-pay | Admitting: Internal Medicine

## 2008-11-05 ENCOUNTER — Ambulatory Visit: Payer: Self-pay | Admitting: Family Medicine

## 2008-11-06 ENCOUNTER — Encounter (INDEPENDENT_AMBULATORY_CARE_PROVIDER_SITE_OTHER): Payer: Self-pay | Admitting: Family Medicine

## 2008-11-07 LAB — CONVERTED CEMR LAB
ALT: 13 units/L (ref 0–35)
AST: 13 units/L (ref 0–37)
BUN: 11 mg/dL (ref 6–23)
Basophils Relative: 0 % (ref 0–1)
Calcium: 9.1 mg/dL (ref 8.4–10.5)
Chloride: 107 meq/L (ref 96–112)
Cholesterol: 137 mg/dL (ref 0–200)
Creatinine, Ser: 0.76 mg/dL (ref 0.40–1.20)
Eosinophils Absolute: 0.1 10*3/uL (ref 0.0–0.7)
Eosinophils Relative: 1 % (ref 0–5)
HCT: 37.5 % (ref 36.0–46.0)
HDL: 40 mg/dL (ref >39)
MCHC: 33.1 g/dL (ref 30.0–36.0)
MCV: 86.6 fL (ref 78.0–100.0)
Neutrophils Relative %: 35 % — ABNORMAL LOW (ref 43–77)
Platelets: 462 10*3/uL — ABNORMAL HIGH (ref 150–400)
RDW: 16.5 % — ABNORMAL HIGH (ref 11.5–15.5)
TSH: 0.771 microintl units/mL (ref 0.350–4.500)
Total Bilirubin: 0.3 mg/dL (ref 0.3–1.2)
Total CHOL/HDL Ratio: 3.4
VLDL: 21 mg/dL (ref 0–40)
WBC: 7.1 10*3/uL (ref 4.0–10.5)

## 2008-11-22 ENCOUNTER — Encounter: Payer: Self-pay | Admitting: Internal Medicine

## 2008-12-17 ENCOUNTER — Ambulatory Visit (HOSPITAL_COMMUNITY): Payer: Self-pay | Admitting: Oncology

## 2009-01-07 ENCOUNTER — Ambulatory Visit (HOSPITAL_COMMUNITY): Admission: RE | Admit: 2009-01-07 | Discharge: 2009-01-07 | Payer: Self-pay | Admitting: Internal Medicine

## 2009-01-08 ENCOUNTER — Encounter (INDEPENDENT_AMBULATORY_CARE_PROVIDER_SITE_OTHER): Payer: Self-pay

## 2009-02-04 LAB — CONVERTED CEMR LAB
Basophils Absolute: 0 10*3/uL (ref 0.0–0.1)
Eosinophils Absolute: 0.1 10*3/uL (ref 0.0–0.7)
Eosinophils Relative: 1 % (ref 0–5)
HCT: 41.4 % (ref 36.0–46.0)
MCV: 86.6 fL (ref 78.0–100.0)
Neutrophils Relative %: 38 % — ABNORMAL LOW (ref 43–77)
Platelets: 551 10*3/uL — ABNORMAL HIGH (ref 150–400)
RDW: 15.1 % (ref 11.5–15.5)
WBC: 7.5 10*3/uL (ref 4.0–10.5)

## 2009-02-05 ENCOUNTER — Ambulatory Visit: Payer: Self-pay | Admitting: Internal Medicine

## 2009-02-05 DIAGNOSIS — R634 Abnormal weight loss: Secondary | ICD-10-CM | POA: Insufficient documentation

## 2009-02-05 DIAGNOSIS — Z860100 Personal history of colon polyps, unspecified: Secondary | ICD-10-CM | POA: Insufficient documentation

## 2009-02-10 ENCOUNTER — Encounter (INDEPENDENT_AMBULATORY_CARE_PROVIDER_SITE_OTHER): Payer: Self-pay | Admitting: *Deleted

## 2009-02-10 ENCOUNTER — Encounter: Payer: Self-pay | Admitting: Gastroenterology

## 2009-02-11 ENCOUNTER — Ambulatory Visit (HOSPITAL_COMMUNITY): Admission: RE | Admit: 2009-02-11 | Discharge: 2009-02-11 | Payer: Self-pay | Admitting: Gastroenterology

## 2009-03-11 ENCOUNTER — Ambulatory Visit (HOSPITAL_COMMUNITY): Payer: Self-pay | Admitting: Oncology

## 2009-03-19 ENCOUNTER — Encounter: Payer: Self-pay | Admitting: Gastroenterology

## 2009-03-25 ENCOUNTER — Ambulatory Visit (HOSPITAL_COMMUNITY): Admission: RE | Admit: 2009-03-25 | Discharge: 2009-03-25 | Payer: Self-pay | Admitting: Internal Medicine

## 2009-04-11 ENCOUNTER — Ambulatory Visit (HOSPITAL_COMMUNITY): Admission: RE | Admit: 2009-04-11 | Discharge: 2009-04-11 | Payer: Self-pay | Admitting: Cardiology

## 2009-04-22 ENCOUNTER — Encounter (HOSPITAL_COMMUNITY): Admission: RE | Admit: 2009-04-22 | Discharge: 2009-05-22 | Payer: Self-pay | Admitting: Oncology

## 2009-04-22 ENCOUNTER — Other Ambulatory Visit (HOSPITAL_COMMUNITY): Payer: Self-pay | Admitting: Oncology

## 2009-05-16 ENCOUNTER — Ambulatory Visit (HOSPITAL_COMMUNITY): Admission: RE | Admit: 2009-05-16 | Discharge: 2009-05-16 | Payer: Self-pay | Admitting: Cardiovascular Disease

## 2009-06-03 ENCOUNTER — Ambulatory Visit (HOSPITAL_COMMUNITY): Payer: Self-pay | Admitting: Oncology

## 2009-06-12 ENCOUNTER — Encounter (INDEPENDENT_AMBULATORY_CARE_PROVIDER_SITE_OTHER): Payer: Self-pay

## 2009-07-16 ENCOUNTER — Encounter (HOSPITAL_COMMUNITY): Admission: RE | Admit: 2009-07-16 | Discharge: 2009-08-15 | Payer: Self-pay | Admitting: Oncology

## 2009-08-18 ENCOUNTER — Ambulatory Visit (HOSPITAL_COMMUNITY): Payer: Self-pay | Admitting: Oncology

## 2009-08-18 ENCOUNTER — Encounter (HOSPITAL_COMMUNITY): Admission: RE | Admit: 2009-08-18 | Discharge: 2009-09-17 | Payer: Self-pay | Admitting: Oncology

## 2009-08-22 ENCOUNTER — Ambulatory Visit (HOSPITAL_COMMUNITY): Admission: RE | Admit: 2009-08-22 | Discharge: 2009-08-22 | Payer: Self-pay | Admitting: General Surgery

## 2009-10-14 ENCOUNTER — Encounter (HOSPITAL_COMMUNITY): Admission: RE | Admit: 2009-10-14 | Discharge: 2009-11-13 | Payer: Self-pay | Admitting: Oncology

## 2009-10-14 ENCOUNTER — Ambulatory Visit (HOSPITAL_COMMUNITY): Payer: Self-pay | Admitting: Oncology

## 2010-01-12 ENCOUNTER — Ambulatory Visit (HOSPITAL_COMMUNITY): Payer: Self-pay | Admitting: Oncology

## 2010-01-13 ENCOUNTER — Ambulatory Visit (HOSPITAL_COMMUNITY): Payer: Self-pay | Admitting: Oncology

## 2010-01-13 ENCOUNTER — Encounter (HOSPITAL_COMMUNITY)
Admission: RE | Admit: 2010-01-13 | Discharge: 2010-02-12 | Payer: Self-pay | Source: Home / Self Care | Attending: Oncology | Admitting: Oncology

## 2010-02-25 ENCOUNTER — Encounter (HOSPITAL_COMMUNITY)
Admission: RE | Admit: 2010-02-25 | Discharge: 2010-03-24 | Payer: Self-pay | Source: Home / Self Care | Attending: Oncology | Admitting: Oncology

## 2010-02-25 LAB — CBC
HCT: 38.6 % (ref 36.0–46.0)
Hemoglobin: 13 g/dL (ref 12.0–15.0)
MCH: 30.2 pg (ref 26.0–34.0)
MCHC: 33.7 g/dL (ref 30.0–36.0)
MCV: 89.6 fL (ref 78.0–100.0)
Platelets: 433 10*3/uL — ABNORMAL HIGH (ref 150–400)
RBC: 4.31 MIL/uL (ref 3.87–5.11)
RDW: 15.8 % — ABNORMAL HIGH (ref 11.5–15.5)
WBC: 8.1 10*3/uL (ref 4.0–10.5)

## 2010-02-25 LAB — FERRITIN: Ferritin: 497 ng/mL — ABNORMAL HIGH (ref 10–291)

## 2010-02-27 ENCOUNTER — Ambulatory Visit (HOSPITAL_COMMUNITY)
Admission: RE | Admit: 2010-02-27 | Discharge: 2010-03-24 | Payer: Self-pay | Source: Home / Self Care | Attending: Oncology | Admitting: Oncology

## 2010-03-14 ENCOUNTER — Encounter: Payer: Self-pay | Admitting: Family Medicine

## 2010-03-15 ENCOUNTER — Encounter: Payer: Self-pay | Admitting: Cardiology

## 2010-03-15 ENCOUNTER — Encounter: Payer: Self-pay | Admitting: Family Medicine

## 2010-03-22 LAB — CONVERTED CEMR LAB

## 2010-03-24 NOTE — Miscellaneous (Signed)
Summary: Orders Update  Clinical Lists Changes  Orders: Added new Test order of T-CBC w/Diff ([id number]) - Signed Added new Test order of T-Ferritin ([id number]) - Signed 

## 2010-03-24 NOTE — Miscellaneous (Signed)
Summary: Orders Update  Clinical Lists Changes  Orders: Added new Test order of T-CBC w/Diff [phone]) - Signed Added new Test order of T-Ferritin [phone]) - Signed  Appended Document: Orders Update appears to be in twice

## 2010-03-24 NOTE — Letter (Signed)
Summary: Recall, Labs Needed  [facility]  [address]   [place], [place] [address]   Phone: [phone]  Fax: [phone]    June 12, 2009  Leslie Williamson [address] [place], [place]  [address] 06/24/1931   Dear [name],   Our records indicate it is time to repeat your blood work.  You can take the enclosed form to the lab on or near the date indicated.  Please make note of the new location of the lab:   [address], 2nd floor   [facility]  Our office will call you within a week to ten business days with the results.  If you do not hear from [place] in 10 business days, you should call the office.  If you have any questions regarding this, call the office at [phone], and ask for the nurse.  Labs are due on 07/17/09.   Sincerely,    Hendricks Limes LPN  [facility] Ph: [phone]   Fax: [phone]

## 2010-05-05 LAB — CBC
HCT: 40.8 % (ref 36.0–46.0)
Hemoglobin: 13.3 g/dL (ref 12.0–15.0)
MCH: 31.1 pg (ref 26.0–34.0)
MCHC: 32.5 g/dL (ref 30.0–36.0)
MCV: 95.7 fL (ref 78.0–100.0)
RDW: 16.4 % — ABNORMAL HIGH (ref 11.5–15.5)

## 2010-05-07 LAB — CBC
HCT: 38.7 % (ref 36.0–46.0)
MCH: 29.2 pg (ref 26.0–34.0)
MCHC: 32.4 g/dL (ref 30.0–36.0)
MCV: 90.1 fL (ref 78.0–100.0)
Platelets: 433 10*3/uL — ABNORMAL HIGH (ref 150–400)
RDW: 17 % — ABNORMAL HIGH (ref 11.5–15.5)

## 2010-05-07 LAB — FERRITIN: Ferritin: 15 ng/mL (ref 10–291)

## 2010-05-10 LAB — IRON AND TIBC
Iron: 56 ug/dL (ref 42–135)
TIBC: 377 ug/dL (ref 250–470)
UIBC: 321 ug/dL

## 2010-05-10 LAB — CBC
HCT: 38.7 % (ref 36.0–46.0)
Hemoglobin: 12.3 g/dL (ref 12.0–15.0)
Hemoglobin: 12.6 g/dL (ref 12.0–15.0)
MCH: 29.6 pg (ref 26.0–34.0)
MCHC: 32.7 g/dL (ref 30.0–36.0)
Platelets: 477 10*3/uL — ABNORMAL HIGH (ref 150–400)
RBC: 4.19 MIL/uL (ref 3.87–5.11)
RDW: 14.8 % (ref 11.5–15.5)
WBC: 9.3 10*3/uL (ref 4.0–10.5)

## 2010-05-10 LAB — BASIC METABOLIC PANEL
BUN: 9 mg/dL (ref 6–23)
CO2: 25 mEq/L (ref 19–32)
Calcium: 9.5 mg/dL (ref 8.4–10.5)
GFR calc non Af Amer: >60 mL/min (ref >60)
Glucose, Bld: 108 mg/dL — ABNORMAL HIGH (ref 70–99)
Potassium: 4 mEq/L (ref 3.5–5.1)

## 2010-05-10 LAB — DIFFERENTIAL
Basophils Absolute: 0.1 10*3/uL (ref 0.0–0.1)
Basophils Relative: 2 % — ABNORMAL HIGH (ref 0–1)
Eosinophils Absolute: 0.1 10*3/uL (ref 0.0–0.7)
Lymphocytes Relative: 39 % (ref 12–46)
Lymphs Abs: 3.6 10*3/uL (ref 0.7–4.0)
Monocytes Absolute: 0.9 10*3/uL (ref 0.1–1.0)
Monocytes Relative: 10 % (ref 3–12)
Monocytes Relative: 13 % — ABNORMAL HIGH (ref 3–12)
Neutro Abs: 4.3 10*3/uL (ref 1.7–7.7)
Neutro Abs: 4.4 10*3/uL (ref 1.7–7.7)
Neutrophils Relative %: 47 % (ref 43–77)

## 2010-05-10 LAB — LACTATE DEHYDROGENASE: LDH: 148 U/L (ref 94–250)

## 2010-05-10 LAB — SEDIMENTATION RATE: Sed Rate: 15 mm/hr (ref 0–22)

## 2010-05-11 LAB — COMPREHENSIVE METABOLIC PANEL
AST: 15 U/L (ref 0–37)
Albumin: 3.5 g/dL (ref 3.5–5.2)
BUN: 10 mg/dL (ref 6–23)
Calcium: 8.9 mg/dL (ref 8.4–10.5)
Creatinine, Ser: 0.82 mg/dL (ref 0.4–1.2)
GFR calc Af Amer: >60 mL/min (ref >60)
Total Bilirubin: 0.5 mg/dL (ref 0.3–1.2)
Total Protein: 6.5 g/dL (ref 6.0–8.3)

## 2010-05-11 LAB — CBC
HCT: 34.8 % — ABNORMAL LOW (ref 36.0–46.0)
MCHC: 33.8 g/dL (ref 30.0–36.0)
MCV: 89.6 fL (ref 78.0–100.0)
Platelets: 486 10*3/uL — ABNORMAL HIGH (ref 150–400)
RDW: 14.7 % (ref 11.5–15.5)
WBC: 8.8 10*3/uL (ref 4.0–10.5)

## 2010-05-11 LAB — DIFFERENTIAL
Basophils Absolute: 0 10*3/uL (ref 0.0–0.1)
Eosinophils Relative: 1 % (ref 0–5)
Lymphocytes Relative: 32 % (ref 12–46)
Lymphs Abs: 2.8 10*3/uL (ref 0.7–4.0)
Monocytes Absolute: 0.7 10*3/uL (ref 0.1–1.0)
Monocytes Relative: 8 % (ref 3–12)
Neutro Abs: 5.1 10*3/uL (ref 1.7–7.7)

## 2010-05-15 LAB — DIFFERENTIAL
Basophils Relative: 0 % (ref 0–1)
Eosinophils Relative: 1 % (ref 0–5)
Monocytes Absolute: 0.8 10*3/uL (ref 0.1–1.0)
Monocytes Relative: 10 % (ref 3–12)
Neutro Abs: 3.5 10*3/uL (ref 1.7–7.7)

## 2010-05-15 LAB — CBC
HCT: 37 % (ref 36.0–46.0)
Hemoglobin: 12.3 g/dL (ref 12.0–15.0)
MCHC: 33.2 g/dL (ref 30.0–36.0)
RBC: 4.09 MIL/uL (ref 3.87–5.11)

## 2010-06-01 LAB — DIFFERENTIAL
Lymphocytes Relative: 46 % (ref 12–46)
Lymphs Abs: 4 10*3/uL (ref 0.7–4.0)
Monocytes Absolute: 0.9 10*3/uL (ref 0.1–1.0)
Monocytes Relative: 11 % (ref 3–12)
Neutro Abs: 3.8 10*3/uL (ref 1.7–7.7)
Neutrophils Relative %: 43 % (ref 43–77)

## 2010-06-01 LAB — HEMOCCULT GUIAC POC 1CARD (OFFICE): Fecal Occult Bld: NEGATIVE

## 2010-06-01 LAB — COMPREHENSIVE METABOLIC PANEL
Albumin: 3.8 g/dL (ref 3.5–5.2)
BUN: 14 mg/dL (ref 6–23)
Calcium: 9.1 mg/dL (ref 8.4–10.5)
Glucose, Bld: 76 mg/dL (ref 70–99)
Potassium: 3.9 mEq/L (ref 3.5–5.1)
Total Protein: 6.6 g/dL (ref 6.0–8.3)

## 2010-06-01 LAB — CBC
HCT: 34.8 % — ABNORMAL LOW (ref 36.0–46.0)
Hemoglobin: 11.3 g/dL — ABNORMAL LOW (ref 12.0–15.0)
Hemoglobin: 11.7 g/dL — ABNORMAL LOW (ref 12.0–15.0)
MCHC: 34.2 g/dL (ref 30.0–36.0)
MCHC: 33.8 g/dL (ref 30.0–36.0)
MCV: 90.6 fL (ref 78.0–100.0)
MCV: 90.8 fL (ref 78.0–100.0)
RBC: 3.66 MIL/uL — ABNORMAL LOW (ref 3.87–5.11)
RBC: 3.83 MIL/uL — ABNORMAL LOW (ref 3.87–5.11)
RDW: 15.2 % (ref 11.5–15.5)

## 2010-06-01 LAB — RETICULOCYTES
Retic Count, Absolute: 54.9 10*3/uL (ref 19.0–186.0)
Retic Ct Pct: 1.5 % (ref 0.4–3.1)

## 2010-06-01 LAB — FERRITIN: Ferritin: 12 ng/mL (ref 10–291)

## 2010-07-02 ENCOUNTER — Other Ambulatory Visit (HOSPITAL_COMMUNITY): Payer: Self-pay | Admitting: Oncology

## 2010-07-02 ENCOUNTER — Encounter (HOSPITAL_COMMUNITY): Payer: No Typology Code available for payment source | Attending: Oncology

## 2010-07-02 DIAGNOSIS — Z79899 Other long term (current) drug therapy: Secondary | ICD-10-CM | POA: Insufficient documentation

## 2010-07-02 DIAGNOSIS — D509 Iron deficiency anemia, unspecified: Secondary | ICD-10-CM | POA: Insufficient documentation

## 2010-07-02 DIAGNOSIS — D649 Anemia, unspecified: Secondary | ICD-10-CM

## 2010-07-02 DIAGNOSIS — C8587 Other specified types of non-Hodgkin lymphoma, spleen: Secondary | ICD-10-CM | POA: Insufficient documentation

## 2010-07-02 LAB — CBC
HCT: 44 % (ref 36.0–46.0)
Hemoglobin: 14.4 g/dL (ref 12.0–15.0)
MCH: 31.2 pg (ref 26.0–34.0)
MCV: 95.4 fL (ref 78.0–100.0)
Platelets: 473 10*3/uL — ABNORMAL HIGH (ref 150–400)
RBC: 4.61 MIL/uL (ref 3.87–5.11)
WBC: 8.6 10*3/uL (ref 4.0–10.5)

## 2010-07-03 ENCOUNTER — Encounter (HOSPITAL_COMMUNITY): Payer: No Typology Code available for payment source | Attending: Oncology | Admitting: Oncology

## 2010-07-03 ENCOUNTER — Ambulatory Visit (HOSPITAL_COMMUNITY): Payer: Self-pay | Admitting: Oncology

## 2010-07-03 ENCOUNTER — Ambulatory Visit (HOSPITAL_COMMUNITY): Payer: Self-pay | Admitting: *Deleted

## 2010-07-03 DIAGNOSIS — D509 Iron deficiency anemia, unspecified: Secondary | ICD-10-CM

## 2010-07-03 DIAGNOSIS — C8589 Other specified types of non-Hodgkin lymphoma, extranodal and solid organ sites: Secondary | ICD-10-CM

## 2010-07-07 NOTE — Assessment & Plan Note (Signed)
NAME:  BILLIEJEAN, SCHIMEK              CHART#:  [id number]   DATE:  01/02/2007                       DOB:  March 22, 1931   CHIEF COMPLAINT:  Consult for colonoscopy.   HISTORY OF PRESENT ILLNESS:  Ms. Bilger is a 77 year old female who  presents because she was told to have a colonoscopy this year.  She  denies any history of polyps.  She denies any family history of  colorectal carcinoma.  She denies any problems to include rectal  bleeding, melena, nausea, vomiting, abdominal pain, constipation,  diarrhea, heartburn, indigestion, dysphagia, or odynophagia.  She has a  daily soft brown bowel movement.  Her last colonoscopy was by Dr. Katrinka Blazing  on October 05, 2000.  She had multiple scattered diverticula in the  descending and rectosigmoid colon, and otherwise normal exam.  She tells  me this is the only colonoscopy that she has had.   PAST MEDICAL AND SURGICAL HISTORY:  1. Colonoscopy as described in 2002 by Dr. Katrinka Blazing.  See HPI.  Initial      attempt for colonoscopy by Dr. Katrinka Blazing resulted in hyperventilation,      hypoxemia, and severe respiratory distress.  The patient tells me      this resulted in a code blue situation.  Colonoscopy had to be      postponed.  However, she did have complete colonoscopy without      complications subsequently as described.  2. Hypertension.  3. Hypercholesterolemia.  4. Headaches.  5. Non-Hodgkin's lymphoma diagnosed in 1996.  Last chemotherapy was      about 3-1/2 years ago.  She is followed by Dr. Mariel Sleet every 4      months.  She is status post splenectomy.  6. She has had a cholecystectomy.  7. Bilateral carpal tunnel release.  8. Several moles removed.  9. She has had a complete hysterectomy.  10.She has history of sleep apnea.  11.Anxiety.  12.Seasonal allergies.   CURRENT MEDICATIONS:  1. Fexofenadine 180 mg daily.  2. Lipitor 40 mg daily.  3. Benicar/hydrochlorothiazide 40/25 mg daily.  4. Plavix 75 mg daily.  5. Alprazolam 0.5 mg  p.r.n.  6. Aspirin 81 mg daily.  7. CPAP p.r.n.   ALLERGIES:  PENCIL LIN.   FAMILY HISTORY:  There is no known family history of colorectal  carcinoma, liver, or GI problems.  Mother deceased at age 30 with  history of diabetes mellitus, hypertension.  Father deceased at 45 with  history of Alzheimer's.  She has 1 sister alive and healthy with history  of arthritis and hypercholesterolemia.   SOCIAL HISTORY:  Ms. Mccauslin is a widow.  She lives with her sister.  She has 3 grown healthy children.  She is a retired Location manager.  She denies any tobacco, alcohol, or drug use.   REVIEW OF SYSTEMS:  See HPI, otherwise negative.   PHYSICAL EXAMINATION:  VITAL SIGNS:  Weight 169 pounds.  Height 60  inches.  Temperature 98.5.  Blood pressure 120/78.  Pulse 76.  GENERAL:  She is a well-developed, well-nourished female in no acute  distress.  She is accompanied by her sister today.  HEENT:  Sclerae clear.  Conjunctivae are pink.  Oropharynx pink and  moist without any lesions.  NECK:  Supple without thyromegaly.  CHEST:  Heart regular rate and rhythm.  Normal S1 and  S2.  I could her  no murmurs, rubs, or gallops.  LUNGS:  Clear to auscultation bilaterally.  ABDOMEN:  Positive bowel sounds x4.  No bruits auscultated.  Soft,  nontender, and non-distended without palpable hepatosplenomegaly.  No  rebound tenderness or guarding.  EXTREMITIES:  Without clubbing or edema bilaterally.  SKIN:  Warm and dry without any rash or jaundice.   IMPRESSION:  Ms. Feliz is a 77 year old African American female with  history of normal complete colonoscopy by Dr. Katrinka Blazing in 2002.  There is  no family history of colorectal carcinoma.  Given current guidelines,  Ms. Charlie is not due for repeat colonoscopy until 8 to 10 years from  complete exam.  She denies any GI symptoms at this time.  I have  discussed this case with Dr. Jena Gauss.  Our plan of care is outlined below.   PLAN  1. Hemoccult stools x3.   2. Suggest colonoscopy in 2012, or sooner if she has any problems.      Lorenza Burton, N.P.  Electronically Signed     R. Roetta Sessions, M.D.  Electronically Signed   KJ/MEDQ  D:  01/03/2007  T:  01/03/2007  Job:  [id number]   cc:   Franchot Heidelberg, M.D.

## 2010-07-07 NOTE — Procedures (Signed)
Leslie Williamson, Leslie Williamson             ACCOUNT NO.:  [id number]   MEDICAL RECORD NO.:  [id number]          PATIENT TYPE:  OIB   LOCATION:  2899                         FACILITY:  [facility]   PHYSICIAN:  Nanetta Batty, M.D.   DATE OF BIRTH:  1931-12-10   DATE OF PROCEDURE:  05/16/2009  DATE OF DISCHARGE:                    PERIPHERAL VASCULAR INVASIVE PROCEDURE   PROCEDURE:  Abdominal aortogram with bifemoral runoff angiogram.   REASON FOR PRESENTATION:  [name] is a 8 year old mildly overweight  single, African American female, mother of 3, grandmother of 4  grandchildren, referred by Dr. Garen Lah for PV dilation.  Her problems  include hypertension and hyperlipidemia.  She has had claudication  bilaterally, less than 1 block, for the past year.  Doppler's performed  at [facility] revealed ABI's in the 0.8 range and CT angiogram  revealed infrainguinal and popliteal disease.  A 2-D echo and Myoview  stress test were normal.  She presents now for angiography and potential  intervention.   PROCEDURE DESCRIPTION:  The patient was brought to the second floor  [facility] PV angiographic suite in the post-absorptive state.  She was  premedicated with p.o. Valium, IV Versed and Fentanyl.  Her left groin  was prepped and shaved in the usual sterile fashion.  A 1% Xylocaine was  used for local anesthesia.  A 5 French sheath was inserted into the left  femoral artery using standard Seldinger technique.  A 5 [place] Tennis  Racquet catheter was used for abdominal aortography with bifemoral  runoff using digital subtraction bolus trace tilt table technique.  Visipaque contrast was used throughout the entirety of the case.  Retrograde aortic pressure was monitored during the case.   ANGIOGRAPH RESULTS:  1. Abdominal aorta:  A:  Renal artery - normal.  B:  Infrarenal abdominal aorta - normal.  1. Left lower extremity:  A:  90% focal mid left superficial femoral artery stenosis.  B.   1 vessel runoff to the peroneal.  1. Right lower extremity:      a.     30 to 40% segmental mid right superficial femoral artery       stenosis.      b.     1 vessel runoff via peroneal which had serial 80% stenosis.   IMPRESSION:  [name] has severe infrapopliteal disease with 1 vessel  runoff.  She does have a focal left superficial femoral artery stenosis  though her most effected side is the right.  At this point, I feel  continued medical therapy is warranted with Pletal and exercise.   The sheath was removed, pressure was held in the groin with achieved  hemostasis.  The patient left the lab in stable condition.  She will be  gently hydrated, discharged home later today and I will see her back in  the office in 1 or 2 weeks.      Nanetta Batty, M.D.     Cordelia Pen  D:  05/16/2009  T:  05/16/2009  Job:  [id number]   cc:   Sheliah Mends, MD  [name] D. Felecia Shelling, MD   Electronically Signed by Nanetta Batty M.D.  on 05/21/2009 02:36:56 PM

## 2010-07-10 NOTE — Procedures (Signed)
Leslie Williamson, Leslie Williamson             ACCOUNT NO.:  [id number]   MEDICAL RECORD NO.:  [id number]          PATIENT TYPE:  OUT   LOCATION:  SLEEP LAB                     FACILITY:  [facility]   PHYSICIAN:  Barbaraann Share, MD,FCCPDATE OF BIRTH:  08[birth year]-33   DATE OF STUDY:  06/13/2006                            NOCTURNAL POLYSOMNOGRAM   REFERRING PHYSICIAN:   LOCATION:  Sleep lab.   INDICATION FOR THE STUDY:  Hypersomnia with sleep apnea.   EPWORTH SCORE:  8.   SLEEP ARCHITECTURE:  The patient had total sleep time of 274 minutes  with decreased slow wave sleep as well as REM.  Sleep onset latency was  prolonged at 92 minutes, and REM onset was mildly prolonged at 101  minutes.  Sleep efficiency was poor at 66%.   RESPIRATORY DATA:  The patient was found to have 37 obstructive events  in the first 100 minutes of sleep, thereby giving her an extrapolated  apnea/hypopnea index of 27 events per hour during the first half of the  night.  Events were not positional, but there was very loud snoring  noted throughout.  The patient was then placed on CPAP by protocol with  a small Respironics ComfortGel CPAP mask and ultimately titrated to a  final pressure of 7 cm of water with excellent control of both snoring  and obstructive events.   OXYGEN DATA:  His O2 desaturation as low as 75% with the patient's  obstructive events.   CARDIAC DATA:  No clinically significant cardiac arrhythmias were noted.   MOVEMENT/PARASOMNIA:  Small numbers of leg jerks without clinical  significance.   IMPRESSION/RECOMMENDATIONS:  Split night study reveals moderate  obstructive sleep apnea/hypopnea syndrome with an apnea/hypopnea index  of 22 events per hour and O2 desaturation as low as 75% during the first  half of the night.  The patient was then placed on CPAP with a small  Respironics ComfortGel nasal mask and  titrated to a final pressure of 7 cm of water with good control.  The  patient should also be  encouraged to work on weight loss if applicable,  and also reminded to not drive if she is sleepy.      Barbaraann Share, MD,FCCP  Diplomate, American Board of Sleep  Medicine  Electronically Signed     [facility]/MEDQ  D:  06/16/2006 11:36:57  T:  06/16/2006 13:28:00  Job:  [id number]   cc:   Franchot Heidelberg, M.D.

## 2010-09-09 ENCOUNTER — Other Ambulatory Visit (HOSPITAL_COMMUNITY): Payer: Self-pay | Admitting: "Endocrinology

## 2010-09-09 DIAGNOSIS — R42 Dizziness and giddiness: Secondary | ICD-10-CM

## 2010-09-10 ENCOUNTER — Other Ambulatory Visit (HOSPITAL_COMMUNITY): Payer: Self-pay | Admitting: "Endocrinology

## 2010-09-10 ENCOUNTER — Ambulatory Visit (HOSPITAL_COMMUNITY)
Admission: RE | Admit: 2010-09-10 | Discharge: 2010-09-10 | Disposition: A | Discharge status: Home / Self Care | Payer: No Typology Code available for payment source | Source: Ambulatory Visit | Attending: "Endocrinology | Admitting: "Endocrinology

## 2010-09-10 DIAGNOSIS — I6789 Other cerebrovascular disease: Secondary | ICD-10-CM | POA: Insufficient documentation

## 2010-09-10 DIAGNOSIS — R42 Dizziness and giddiness: Secondary | ICD-10-CM

## 2010-09-25 ENCOUNTER — Other Ambulatory Visit (HOSPITAL_COMMUNITY): Payer: Self-pay | Admitting: Oncology

## 2010-09-25 ENCOUNTER — Encounter (HOSPITAL_COMMUNITY): Payer: No Typology Code available for payment source | Attending: Oncology

## 2010-09-25 DIAGNOSIS — D509 Iron deficiency anemia, unspecified: Secondary | ICD-10-CM | POA: Insufficient documentation

## 2010-09-25 DIAGNOSIS — C8587 Other specified types of non-Hodgkin lymphoma, spleen: Secondary | ICD-10-CM | POA: Insufficient documentation

## 2010-09-25 DIAGNOSIS — C8589 Other specified types of non-Hodgkin lymphoma, extranodal and solid organ sites: Secondary | ICD-10-CM

## 2010-09-25 DIAGNOSIS — Z79899 Other long term (current) drug therapy: Secondary | ICD-10-CM | POA: Insufficient documentation

## 2010-09-25 LAB — CBC
HCT: 38.9 % (ref 36.0–46.0)
Hemoglobin: 12.9 g/dL (ref 12.0–15.0)
MCHC: 33.2 g/dL (ref 30.0–36.0)
MCV: 93.1 fL (ref 78.0–100.0)

## 2010-09-25 LAB — BASIC METABOLIC PANEL
BUN: 13 mg/dL (ref 6–23)
CO2: 24 mEq/L (ref 19–32)
Chloride: 102 mEq/L (ref 96–112)
Creatinine, Ser: 0.73 mg/dL (ref 0.50–1.10)
GFR calc Af Amer: >60 mL/min (ref >60)
Potassium: 3.3 mEq/L — ABNORMAL LOW (ref 3.5–5.1)

## 2010-09-25 MED ORDER — POTASSIUM CHLORIDE ER 10 MEQ PO TBCR: EXTENDED_RELEASE_TABLET | ORAL | Status: DC

## 2010-09-25 NOTE — Progress Notes (Signed)
Labs drawn today for cbc,ferr,bmp 

## 2010-11-18 LAB — COMPREHENSIVE METABOLIC PANEL
BUN: 19
CO2: 27
Calcium: 9.8
Chloride: 106
Creatinine, Ser: 0.98
GFR calc non Af Amer: 55 — ABNORMAL LOW
Total Bilirubin: 0.6

## 2010-11-18 LAB — CBC
HCT: 36.5
MCHC: 34
MCV: 90.9
Platelets: 412 — ABNORMAL HIGH
RBC: 4.02
WBC: 8.6

## 2010-11-26 LAB — COMPREHENSIVE METABOLIC PANEL
ALT: 18 U/L (ref 0–35)
AST: 24 U/L (ref 0–37)
Alkaline Phosphatase: 67 U/L (ref 39–117)
CO2: 27 mEq/L (ref 19–32)
Chloride: 105 mEq/L (ref 96–112)
GFR calc Af Amer: >60 mL/min (ref >60)
GFR calc non Af Amer: 60 mL/min — ABNORMAL LOW (ref >60)
Potassium: 3.5 mEq/L (ref 3.5–5.1)
Sodium: 138 mEq/L (ref 135–145)
Total Bilirubin: 0.5 mg/dL (ref 0.3–1.2)

## 2010-11-26 LAB — LACTATE DEHYDROGENASE: LDH: 146 U/L (ref 94–250)

## 2010-11-26 LAB — DIFFERENTIAL
Eosinophils Absolute: 0.1 10*3/uL (ref 0.0–0.7)
Eosinophils Relative: 1 % (ref 0–5)
Lymphs Abs: 4.6 10*3/uL — ABNORMAL HIGH (ref 0.7–4.0)

## 2010-11-26 LAB — CBC
RBC: 4.16 MIL/uL (ref 3.87–5.11)
WBC: 10.4 10*3/uL (ref 4.0–10.5)

## 2010-11-30 LAB — CBC
HCT: 37.6
Platelets: 476 — ABNORMAL HIGH
RDW: 14.6

## 2010-11-30 LAB — COMPREHENSIVE METABOLIC PANEL
Albumin: 3.4 — ABNORMAL LOW
Alkaline Phosphatase: 56
BUN: 7
Creatinine, Ser: 0.79
Potassium: 3.1 — ABNORMAL LOW
Total Protein: 6

## 2010-12-09 LAB — COMPREHENSIVE METABOLIC PANEL
BUN: 11
CO2: 28
Chloride: 104
Creatinine, Ser: 0.8
GFR calc non Af Amer: >60
Glucose, Bld: 93
Total Bilirubin: 0.8

## 2010-12-09 LAB — CBC
HCT: 41.3
Hemoglobin: 14.1
MCV: 91
RBC: 4.54
WBC: 9.3

## 2010-12-09 LAB — DIFFERENTIAL
Basophils Absolute: 0
Basophils Relative: 1
Lymphocytes Relative: 45
Neutro Abs: 4.1

## 2010-12-09 LAB — LACTATE DEHYDROGENASE: LDH: 157

## 2010-12-17 ENCOUNTER — Encounter (HOSPITAL_COMMUNITY): Payer: No Typology Code available for payment source | Attending: Oncology

## 2010-12-17 ENCOUNTER — Encounter (HOSPITAL_COMMUNITY): Payer: Self-pay | Admitting: Oncology

## 2010-12-17 ENCOUNTER — Encounter (HOSPITAL_BASED_OUTPATIENT_CLINIC_OR_DEPARTMENT_OTHER): Payer: No Typology Code available for payment source | Admitting: Oncology

## 2010-12-17 ENCOUNTER — Ambulatory Visit (HOSPITAL_COMMUNITY): Payer: No Typology Code available for payment source | Admitting: Oncology

## 2010-12-17 DIAGNOSIS — C8590 Non-Hodgkin lymphoma, unspecified, unspecified site: Secondary | ICD-10-CM | POA: Insufficient documentation

## 2010-12-17 DIAGNOSIS — C8589 Other specified types of non-Hodgkin lymphoma, extranodal and solid organ sites: Secondary | ICD-10-CM

## 2010-12-17 DIAGNOSIS — D509 Iron deficiency anemia, unspecified: Secondary | ICD-10-CM

## 2010-12-17 HISTORY — DX: Non-Hodgkin lymphoma, unspecified, unspecified site: C85.90

## 2010-12-17 LAB — CBC
Platelets: 520 10*3/uL — ABNORMAL HIGH (ref 150–400)
RBC: 4.59 MIL/uL (ref 3.87–5.11)
RDW: 14.9 % (ref 11.5–15.5)
WBC: 8.7 10*3/uL (ref 4.0–10.5)

## 2010-12-17 NOTE — Patient Instructions (Signed)
[facility]  Discharge Instructions  RECOMMENDATIONS MADE BY THE CONSULTANT AND ANY TEST RESULTS WILL BE SENT TO YOUR REFERRING DOCTOR.   EXAM FINDINGS BY MD TODAY AND SIGNS AND SYMPTOMS TO REPORT TO CLINIC OR PRIMARY MD: Increase your fluid intake. Water is best. Drink 10 to 12 8oz glasses daily. Decrease you salt intake.   INSTRUCTIONS GIVEN AND DISCUSSED: We will call you if any of your lab work is abnormal. If your dizziness does not improve over the next few weeks, call us and let us know. #[phone]   SPECIAL INSTRUCTIONS/FOLLOW-UP: Return to clinic in 6 months to see MD.    I acknowledge that I have been informed and understand all the instructions given to me and received a copy. I do not have any more questions at this time, but understand that I may call the Specialty Clinic at [facility] at [phone] during business hours should I have any further questions or need assistance in obtaining follow-up care.    __________________________________________  _____________  __________ Signature of Patient or Authorized Representative            Date                   Time    __________________________________________ Nurse's Signature

## 2010-12-17 NOTE — Progress Notes (Signed)
Leslie Heidelberg, MD [address] [place] [address]  1. ANEMIA, IRON DEFICIENCY   2. NHL (non-Hodgkin's lymphoma)     CURRENT THERAPY: S/P intermittent Feraheme infusion.  S/P 6 cycles of R-CHOP for NHL.  INTERVAL HISTORY: Leslie Williamson 77 y.o. female returns for  regular  visit for followup of iron deficiency anemia and NHL.  The patient reports not taking her potassium for the past few days because she was traveling.  She reports that she went to [place], [place] not too long ago.  She reports that she had a pleasant trip up [place].    She complains of some vertigo symptoms.  She describes that she feels as though she is spinning and/or leaning or swaying to the right.  She saw her primary care physician regarding this symptom.  He performed an MRI of the brain which was negative for any worrisome findings.   She explains that she is doing well otherwise.  I have made some suggestions about her vertigo symptoms which are detailed in the plan below.  She denies any B-symptoms including, fevers, chills, and night sweats.  She denies any appetite changes.  She denies any weight loss.  She reports that she saw a dermatologist in the past about her hyperpigmented skin on her face.  These appear to be seborrheic keratosis.  She has declined a dermatology consult at this time.  She reports that her last dermatology appointment years ago told her that this problem is hereditary.  Past Medical History  Diagnosis Date  . Iron deficiency anemia   . 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 Hypercholesteremia   . Obesity   . Hypokalemia   . PVD (peripheral vascular disease)   . Anxiety   . Depression   . NHL (non-Hodgkin's lymphoma) 12/17/2010    has HYPERCHOLESTEROLEMIA; HYPERLIPIDEMIA; ANEMIA, IRON DEFICIENCY; THROMBOCYTOSIS; ANXIETY; CARPAL TUNNEL SYNDROME; HYPERTENSION; PERIPHERAL VASCULAR DISEASE;  ALLERGIC RHINITIS; GERD; SEBORRHEIC KERATOSIS; WEIGHT LOSS, ABNORMAL; COLONIC POLYPS, ADENOMATOUS, HX OF; and NHL (non-Hodgkin's lymphoma) on her problem list.     is allergic to penicillins.  Leslie Williamson does not currently have medications on file.  Past Surgical History  Procedure Date  . Port-a-cath removal     Denies any headaches, dizziness, double vision, fevers, chills, night sweats, nausea, vomiting, diarrhea, constipation, chest pain, heart palpitations, shortness of breath, blood in stool, black tarry stool, urinary pain, urinary burning, urinary frequency, hematuria.   PHYSICAL EXAMINATION  ECOG PERFORMANCE STATUS: 1 - Symptomatic but completely ambulatory  Filed Vitals:   12/17/10 0920  BP: 151/92  Pulse: 86  Temp: 98 F (36.7 C)    GENERAL:alert, no distress, well nourished, well developed, comfortable, cooperative and smiling SKIN: skin color, texture, turgor are normal HEAD: Normocephalic EYES: normal EARS: External ears normal OROPHARYNX:mucous membranes are moist  NECK: supple, trachea midline LYMPH:  no palpable lymphadenopathy, no hepatosplenomegaly BREAST:breasts appear normal, no suspicious masses, no skin or nipple changes or axillary nodes LUNGS: clear to auscultation and percussion HEART: regular rate & rhythm, no murmurs, no gallops, S1 normal and S2 normal ABDOMEN:abdomen soft, non-tender, obese, normal bowel sounds and no hepatosplenomegaly BACK: Back symmetric, no curvature., No CVA tenderness EXTREMITIES:less then 2 second capillary refill, no joint deformities, effusion, or inflammation, no edema, no skin discoloration, no clubbing, no cyanosis  NEURO: alert & oriented x 3 with fluent speech, no focal motor/sensory deficits, gait normal  LABORATORY DATA: CBC    Component Value Date/Time   WBC 8.7 12/17/2010 0918   RBC 4.59 12/17/2010 0918   HGB 14.2 12/17/2010 0918   HCT 43.1 12/17/2010 0918   PLT 520* 12/17/2010 0918   MCV 93.9  12/17/2010 0918   MCH 30.9 12/17/2010 0918   MCHC 32.9 12/17/2010 0918   RDW 14.9 12/17/2010 0918   LYMPHSABS 2.9 08/21/2009 1303   MONOABS 0.9 08/21/2009 1303   EOSABS 0.1 08/21/2009 1303   BASOSABS 0.1 08/21/2009 1303      Chemistry      Component Value Date/Time   NA 140 09/25/2010 0900   K 3.3* 09/25/2010 0900   CL 102 09/25/2010 0900   CO2 24 09/25/2010 0900   BUN 13 09/25/2010 0900   CREATININE 0.73 09/25/2010 0900      Component Value Date/Time   CALCIUM 9.5 09/25/2010 0900   ALKPHOS 69 07/16/2009 1134   AST 15 07/16/2009 1134   ALT 11 07/16/2009 1134   BILITOT 0.5 07/16/2009 1134       RADIOGRAPHIC STUDIES:  09/10/10  *RADIOLOGY REPORT*  Clinical Data: Dizziness.  MRI BRAIN WITHOUT CONTRAST  Technique: Multiplanar, multiecho pulse sequences of the brain and  surrounding structures were obtained according to standard protocol  without intravenous contrast.  Comparison: CT 04/21/2006  Findings: IV access was attempted but unsuccessful. Therefore the  study was done without intravenous contrast.  Ventricle size is normal. Patchy areas of hyperintensity in the  periventricular deep white matter bilaterally compatible with  chronic microvascular ischemia. Mild chronic ischemic changes in  the pons. No acute infarct.  Negative for hemorrhage or mass lesion. No edema in the brain.  Paranasal sinuses are clear.  IMPRESSION:  Chronic microvascular ischemia. No acute infarct or mass.  Original Report Authenticated By: Camelia Phenes, M.D.     ASSESSMENT:  1. Vertigo with swaying or tilting, and body spinning. 2. Iron deficiency anemia with poor oral absorption 3. NHL, with Richter's transformation, S/P 6 cycles of R-CHOP 4. Hypokalemia   PLAN:  1. Increase water intake 2. Decrease salt intake. 3. Lab work in 6 months: CBC diff, CMET, LDH, ferritin 4. Return in 6 months for follow-up. 5. The patient will call us in one month to let us know if her vertigo symptoms have  improved.  If not, this may warrant and ENT evaluation or PCP follow-up appointment.    All questions were answered. The patient knows to call the clinic with any problems, questions or concerns. We can certainly see the patient much sooner if necessary.   Aamari Strawderman

## 2010-12-17 NOTE — Progress Notes (Signed)
Labs drawn today for cbc,ferr 

## 2010-12-18 ENCOUNTER — Ambulatory Visit (HOSPITAL_COMMUNITY): Payer: No Typology Code available for payment source | Admitting: Oncology

## 2010-12-24 ENCOUNTER — Other Ambulatory Visit (HOSPITAL_COMMUNITY): Payer: Self-pay | Admitting: Oncology

## 2010-12-24 DIAGNOSIS — D473 Essential (hemorrhagic) thrombocythemia: Secondary | ICD-10-CM

## 2010-12-24 DIAGNOSIS — D75839 Thrombocytosis, unspecified: Secondary | ICD-10-CM

## 2011-06-17 ENCOUNTER — Encounter (HOSPITAL_COMMUNITY): Payer: No Typology Code available for payment source | Attending: Oncology

## 2011-06-17 DIAGNOSIS — E876 Hypokalemia: Secondary | ICD-10-CM | POA: Insufficient documentation

## 2011-06-17 DIAGNOSIS — F3289 Other specified depressive episodes: Secondary | ICD-10-CM | POA: Insufficient documentation

## 2011-06-17 DIAGNOSIS — E669 Obesity, unspecified: Secondary | ICD-10-CM | POA: Insufficient documentation

## 2011-06-17 DIAGNOSIS — F411 Generalized anxiety disorder: Secondary | ICD-10-CM | POA: Insufficient documentation

## 2011-06-17 DIAGNOSIS — C8587 Other specified types of non-Hodgkin lymphoma, spleen: Secondary | ICD-10-CM | POA: Insufficient documentation

## 2011-06-17 DIAGNOSIS — F329 Major depressive disorder, single episode, unspecified: Secondary | ICD-10-CM | POA: Insufficient documentation

## 2011-06-17 DIAGNOSIS — Z860100 Personal history of colon polyps, unspecified: Secondary | ICD-10-CM | POA: Insufficient documentation

## 2011-06-17 DIAGNOSIS — E7800 Pure hypercholesterolemia, unspecified: Secondary | ICD-10-CM | POA: Insufficient documentation

## 2011-06-17 DIAGNOSIS — I739 Peripheral vascular disease, unspecified: Secondary | ICD-10-CM | POA: Insufficient documentation

## 2011-06-17 DIAGNOSIS — C8590 Non-Hodgkin lymphoma, unspecified, unspecified site: Secondary | ICD-10-CM

## 2011-06-17 DIAGNOSIS — D509 Iron deficiency anemia, unspecified: Secondary | ICD-10-CM

## 2011-06-17 LAB — CBC
Platelets: 496 10*3/uL — ABNORMAL HIGH (ref 150–400)
RBC: 4.72 MIL/uL (ref 3.87–5.11)
WBC: 8 10*3/uL (ref 4.0–10.5)

## 2011-06-18 ENCOUNTER — Ambulatory Visit (HOSPITAL_COMMUNITY): Payer: No Typology Code available for payment source | Admitting: Oncology

## 2011-06-21 ENCOUNTER — Encounter (HOSPITAL_BASED_OUTPATIENT_CLINIC_OR_DEPARTMENT_OTHER): Payer: No Typology Code available for payment source | Admitting: Oncology

## 2011-06-21 ENCOUNTER — Encounter (HOSPITAL_COMMUNITY): Payer: Self-pay | Admitting: Oncology

## 2011-06-21 VITALS — BP 135/85 | HR 76 | Temp 98.4°F | Wt 166.7 lb

## 2011-06-21 DIAGNOSIS — C8589 Other specified types of non-Hodgkin lymphoma, extranodal and solid organ sites: Secondary | ICD-10-CM

## 2011-06-21 DIAGNOSIS — D509 Iron deficiency anemia, unspecified: Secondary | ICD-10-CM

## 2011-06-21 DIAGNOSIS — C8590 Non-Hodgkin lymphoma, unspecified, unspecified site: Secondary | ICD-10-CM

## 2011-06-21 NOTE — Progress Notes (Signed)
Leslie Heidelberg, MD [address] [place] [address]  1. NHL (non-Hodgkin's lymphoma)  HYDROcodone-acetaminophen (VICODIN) 5-500 MG per tablet, CBC, Differential, Comprehensive metabolic panel, Lactate dehydrogenase, Sedimentation rate  2. ANEMIA, IRON DEFICIENCY  Ferritin    CURRENT THERAPY:S/P intermittent Feraheme infusion. S/P 6 cycles of R-CHOP for NHL.   INTERVAL HISTORY: Leslie Williamson 78 y.o. female returns for  regular  visit for followup of iron deficiency anemia and NHL.   The patient is doing very well.  She went back to the [place] this winter due to her niece's husbands death.  He passed away at the age of 85 from an MI.   The patient denies any complaints.  She admits that she had a beautiful Easter Holiday with Family.  She enjoyed her time with the family.   She denies any complaints.  She looks great and feels good. She denies any B symptoms including fevers, chills, night sweats, appetite changes, and weight loss.    I personally reviewed and went over laboratory results with the patient.  ROS: No TIA's or unusual headaches, no dysphagia.  No prolonged cough. No dyspnea or chest pain on exertion.  No abdominal pain, change in bowel habits, black or bloody stools.  No urinary tract symptoms.  No new or unusual musculoskeletal symptoms.  Normal menses, no abnormal vaginal bleeding, discharge or unexpected pelvic pain. No new breast lumps, breast pain or nipple discharge.  The patient admits that she has not been compliant with her yearly mammogram.  She reports that she has not had one performed in quite some time.  She was agreeable to a breast exam today.    Past Medical History  Diagnosis Date  . Iron deficiency anemia   . 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 Hypercholesteremia   . Obesity   . Hypokalemia   . PVD (peripheral vascular disease)   . Anxiety   .  Depression   . NHL (non-Hodgkin's lymphoma) 12/17/2010    has HYPERCHOLESTEROLEMIA; HYPERLIPIDEMIA; ANEMIA, IRON DEFICIENCY; THROMBOCYTOSIS; ANXIETY; CARPAL TUNNEL SYNDROME; HYPERTENSION; PERIPHERAL VASCULAR DISEASE; ALLERGIC RHINITIS; GERD; SEBORRHEIC KERATOSIS; WEIGHT LOSS, ABNORMAL; COLONIC POLYPS, ADENOMATOUS, HX OF; and NHL (non-Hodgkin's lymphoma) on her problem list.     is allergic to penicillins.  Ms. Khurana does not currently have medications on file.  Past Surgical History  Procedure Date  . Port-a-cath removal     Denies any headaches, dizziness, double vision, fevers, chills, night sweats, nausea, vomiting, diarrhea, constipation, chest pain, heart palpitations, shortness of breath, blood in stool, black tarry stool, urinary pain, urinary burning, urinary frequency, hematuria.   PHYSICAL EXAMINATION  ECOG PERFORMANCE STATUS: 0 - Asymptomatic  Filed Vitals:   06/21/11 1119  BP: 135/85  Pulse: 76  Temp: 98.4 F (36.9 C)    GENERAL:alert, no distress, well nourished, well developed, comfortable, cooperative, obese and smiling SKIN: skin color, texture, turgor are normal, no rashes or significant lesions, seborrheic keratosis  generalized HEAD: Normocephalic, No masses, lesions, tenderness or abnormalities EYES: normal, Conjunctiva are pink and non-injected EARS: External ears normal OROPHARYNX:lips, buccal mucosa, and tongue normal and mucous membranes are moist  NECK: supple, no adenopathy, thyroid normal size, non-tender, without nodularity, no stridor, non-tender, trachea midline LYMPH:  no palpable lymphadenopathy, no hepatosplenomegaly BREAST:B/L breast exam is negative for any masses, lesions, or scars.  No nipple inversion or discharge. Non-tender. SKs noted throughout.  LUNGS: clear to auscultation  and percussion HEART: regular rate & rhythm, no murmurs, no gallops, S1 normal and S2 normal ABDOMEN:abdomen soft, non-tender, obese, normal bowel sounds, no  masses or organomegaly and no hepatosplenomegaly BACK: Back symmetric, no curvature., No CVA tenderness EXTREMITIES:less then 2 second capillary refill, no joint deformities, effusion, or inflammation, no edema, no skin discoloration, no clubbing, no cyanosis  NEURO: alert & oriented x 3 with fluent speech, no focal motor/sensory deficits, gait normal    LABORATORY DATA: CBC    Component Value Date/Time   WBC 8.0 06/17/2011 0916   RBC 4.72 06/17/2011 0916   HGB 12.9 06/17/2011 0916   HCT 40.6 06/17/2011 0916   PLT 496* 06/17/2011 0916   MCV 86.0 06/17/2011 0916   MCH 27.3 06/17/2011 0916   MCHC 31.8 06/17/2011 0916   RDW 17.5* 06/17/2011 0916   LYMPHSABS 2.9 08/21/2009 1303   MONOABS 0.9 08/21/2009 1303   EOSABS 0.1 08/21/2009 1303   BASOSABS 0.1 08/21/2009 1303      Chemistry      Component Value Date/Time   NA 140 09/25/2010 0900   K 3.3* 09/25/2010 0900   CL 102 09/25/2010 0900   CO2 24 09/25/2010 0900   BUN 13 09/25/2010 0900   CREATININE 0.73 09/25/2010 0900      Component Value Date/Time   CALCIUM 9.5 09/25/2010 0900   ALKPHOS 69 07/16/2009 1134   AST 15 07/16/2009 1134   ALT 11 07/16/2009 1134   BILITOT 0.5 07/16/2009 1134       ASSESSMENT:  1. Iron deficiency anemia with poor oral absorption  2. NHL, with Richter's transformation, S/P 6 cycles of R-CHOP  3. Hypokalemia   PLAN:  1. I personally reviewed and went over laboratory results with the patient. 2. Recommended and the patient agreed to have a mammogram performed in the near future.  3. Lab work in 6 months: CBC diff, CMET, LDH, ESR, Ferritin. 4. Return in 6 months for follow-up.  All questions were answered. The patient knows to call the clinic with any problems, questions or concerns. We can certainly see the patient much sooner if necessary.  Kye Hedden

## 2011-06-21 NOTE — Patient Instructions (Signed)
[facility]  Discharge Instructions  RECOMMENDATIONS MADE BY THE CONSULTANT AND ANY TEST RESULTS WILL BE SENT TO YOUR REFERRING DOCTOR.   EXAM FINDINGS BY MD TODAY AND SIGNS AND SYMPTOMS TO REPORT TO CLINIC OR PRIMARY MD: exam good    SPECIAL INSTRUCTIONS/FOLLOW-UP: Xray Studies Needed --mammogram in next 2 months Return to see [place] in 6 months with labs before   I acknowledge that I have been informed and understand all the instructions given to me and received a copy. I do not have any more questions at this time, but understand that I may call the Specialty Clinic at [facility] at [phone] during business hours should I have any further questions or need assistance in obtaining follow-up care.    __________________________________________  _____________  __________ Signature of Patient or Authorized Representative            Date                   Time    __________________________________________ Nurse's Signature

## 2011-06-22 ENCOUNTER — Other Ambulatory Visit (HOSPITAL_COMMUNITY): Payer: Self-pay | Admitting: Oncology

## 2011-06-22 ENCOUNTER — Telehealth (HOSPITAL_COMMUNITY): Payer: Self-pay | Admitting: Oncology

## 2011-06-22 DIAGNOSIS — D509 Iron deficiency anemia, unspecified: Secondary | ICD-10-CM

## 2011-06-22 NOTE — Telephone Encounter (Signed)
Louann Liv contacted and scheduled for labs on 06/25/11.

## 2011-06-25 ENCOUNTER — Encounter (HOSPITAL_COMMUNITY): Payer: No Typology Code available for payment source | Attending: Oncology

## 2011-06-25 DIAGNOSIS — C8590 Non-Hodgkin lymphoma, unspecified, unspecified site: Secondary | ICD-10-CM

## 2011-06-25 DIAGNOSIS — D509 Iron deficiency anemia, unspecified: Secondary | ICD-10-CM | POA: Insufficient documentation

## 2011-06-25 DIAGNOSIS — C8589 Other specified types of non-Hodgkin lymphoma, extranodal and solid organ sites: Secondary | ICD-10-CM

## 2011-06-25 LAB — COMPREHENSIVE METABOLIC PANEL
ALT: 8 U/L (ref 0–35)
Alkaline Phosphatase: 76 U/L (ref 39–117)
CO2: 27 mEq/L (ref 19–32)
Chloride: 105 mEq/L (ref 96–112)
GFR calc Af Amer: >90 mL/min (ref >90)
GFR calc non Af Amer: 79 mL/min — ABNORMAL LOW (ref >90)
Glucose, Bld: 94 mg/dL (ref 70–99)
Potassium: 3.9 mEq/L (ref 3.5–5.1)
Sodium: 142 mEq/L (ref 135–145)
Total Protein: 7.1 g/dL (ref 6.0–8.3)

## 2011-06-25 LAB — CBC
MCH: 28 pg (ref 26.0–34.0)
MCV: 85.2 fL (ref 78.0–100.0)
Platelets: 505 10*3/uL — ABNORMAL HIGH (ref 150–400)
RBC: 5 MIL/uL (ref 3.87–5.11)

## 2011-06-25 LAB — DIFFERENTIAL
Lymphocytes Relative: 44 % (ref 12–46)
Lymphs Abs: 3.8 10*3/uL (ref 0.7–4.0)
Neutro Abs: 4 10*3/uL (ref 1.7–7.7)
Neutrophils Relative %: 46 % (ref 43–77)

## 2011-06-25 NOTE — Progress Notes (Signed)
Labs drawn

## 2011-06-25 NOTE — Progress Notes (Signed)
Labs drawn today for cbc/diff,ferr,ldh,cmp

## 2011-06-28 NOTE — Progress Notes (Signed)
Addended by: Ellouise Newer III on: 06/28/2011 08:15 AM   Modules accepted: Orders

## 2011-08-02 ENCOUNTER — Telehealth (HOSPITAL_COMMUNITY): Payer: Self-pay

## 2011-08-02 NOTE — Telephone Encounter (Signed)
Call from patient requesting lab appointment to be changed to 2 week from today.  Is having to take care of daughter in [place] and cannot come on 6/14.  Rescheduled per request of patient.

## 2011-08-06 ENCOUNTER — Other Ambulatory Visit (HOSPITAL_COMMUNITY): Payer: No Typology Code available for payment source

## 2011-08-16 ENCOUNTER — Encounter (HOSPITAL_COMMUNITY): Payer: No Typology Code available for payment source | Attending: Oncology

## 2011-08-16 DIAGNOSIS — D509 Iron deficiency anemia, unspecified: Secondary | ICD-10-CM | POA: Insufficient documentation

## 2011-08-16 LAB — CBC
HCT: 41.8 % (ref 36.0–46.0)
MCHC: 32.8 g/dL (ref 30.0–36.0)
MCV: 90.7 fL (ref 78.0–100.0)
RDW: 17.7 % — ABNORMAL HIGH (ref 11.5–15.5)

## 2011-08-17 ENCOUNTER — Other Ambulatory Visit (HOSPITAL_COMMUNITY): Payer: Self-pay | Admitting: Oncology

## 2011-08-17 DIAGNOSIS — D509 Iron deficiency anemia, unspecified: Secondary | ICD-10-CM

## 2011-09-13 ENCOUNTER — Other Ambulatory Visit (HOSPITAL_COMMUNITY): Payer: Self-pay | Admitting: Oncology

## 2011-09-13 DIAGNOSIS — Z139 Encounter for screening, unspecified: Secondary | ICD-10-CM

## 2011-09-16 ENCOUNTER — Encounter (HOSPITAL_COMMUNITY): Payer: No Typology Code available for payment source | Attending: Oncology

## 2011-09-16 ENCOUNTER — Ambulatory Visit (HOSPITAL_COMMUNITY)
Admission: RE | Admit: 2011-09-16 | Discharge: 2011-09-16 | Disposition: A | Discharge status: Home / Self Care | Payer: No Typology Code available for payment source | Source: Ambulatory Visit | Attending: Oncology | Admitting: Oncology

## 2011-09-16 DIAGNOSIS — Z139 Encounter for screening, unspecified: Secondary | ICD-10-CM

## 2011-09-16 DIAGNOSIS — D509 Iron deficiency anemia, unspecified: Secondary | ICD-10-CM | POA: Insufficient documentation

## 2011-09-16 DIAGNOSIS — Z1231 Encounter for screening mammogram for malignant neoplasm of breast: Secondary | ICD-10-CM | POA: Insufficient documentation

## 2011-09-16 LAB — CBC
MCH: 30 pg (ref 26.0–34.0)
MCHC: 33.2 g/dL (ref 30.0–36.0)
MCV: 90.5 fL (ref 78.0–100.0)
Platelets: 524 10*3/uL — ABNORMAL HIGH (ref 150–400)
RDW: 15.6 % — ABNORMAL HIGH (ref 11.5–15.5)
WBC: 9.2 10*3/uL (ref 4.0–10.5)

## 2011-09-16 LAB — DIFFERENTIAL
Basophils Absolute: 0 10*3/uL (ref 0.0–0.1)
Basophils Relative: 0 % (ref 0–1)
Eosinophils Absolute: 0.1 10*3/uL (ref 0.0–0.7)
Eosinophils Relative: 1 % (ref 0–5)

## 2011-09-16 LAB — FERRITIN: Ferritin: 23 ng/mL (ref 10–291)

## 2011-09-16 NOTE — Progress Notes (Signed)
Labs drawn today for cbc/diff,ferr 

## 2011-12-06 ENCOUNTER — Other Ambulatory Visit (HOSPITAL_COMMUNITY): Payer: Self-pay | Admitting: Oncology

## 2011-12-06 DIAGNOSIS — E876 Hypokalemia: Secondary | ICD-10-CM

## 2011-12-06 MED ORDER — POTASSIUM CHLORIDE ER 10 MEQ PO TBCR: 10.0000 meq | EXTENDED_RELEASE_TABLET | Freq: Two times a day (BID) | ORAL | Status: DC

## 2011-12-20 ENCOUNTER — Other Ambulatory Visit (HOSPITAL_COMMUNITY): Payer: No Typology Code available for payment source

## 2011-12-22 ENCOUNTER — Ambulatory Visit (HOSPITAL_COMMUNITY): Payer: No Typology Code available for payment source | Admitting: Oncology

## 2011-12-28 ENCOUNTER — Encounter (HOSPITAL_COMMUNITY): Payer: No Typology Code available for payment source | Attending: Oncology

## 2011-12-28 DIAGNOSIS — D509 Iron deficiency anemia, unspecified: Secondary | ICD-10-CM

## 2011-12-28 DIAGNOSIS — C8590 Non-Hodgkin lymphoma, unspecified, unspecified site: Secondary | ICD-10-CM

## 2011-12-28 DIAGNOSIS — C8589 Other specified types of non-Hodgkin lymphoma, extranodal and solid organ sites: Secondary | ICD-10-CM | POA: Insufficient documentation

## 2011-12-28 DIAGNOSIS — E876 Hypokalemia: Secondary | ICD-10-CM | POA: Insufficient documentation

## 2011-12-28 DIAGNOSIS — L821 Other seborrheic keratosis: Secondary | ICD-10-CM | POA: Insufficient documentation

## 2011-12-28 DIAGNOSIS — D473 Essential (hemorrhagic) thrombocythemia: Secondary | ICD-10-CM | POA: Insufficient documentation

## 2011-12-28 LAB — COMPREHENSIVE METABOLIC PANEL
AST: 19 U/L (ref 0–37)
Albumin: 3.6 g/dL (ref 3.5–5.2)
Alkaline Phosphatase: 64 U/L (ref 39–117)
Chloride: 102 mEq/L (ref 96–112)
Creatinine, Ser: 0.77 mg/dL (ref 0.50–1.10)
Potassium: 3.7 mEq/L (ref 3.5–5.1)
Total Bilirubin: 0.4 mg/dL (ref 0.3–1.2)
Total Protein: 7.2 g/dL (ref 6.0–8.3)

## 2011-12-28 LAB — CBC
MCHC: 33.2 g/dL (ref 30.0–36.0)
RDW: 16 % — ABNORMAL HIGH (ref 11.5–15.5)

## 2011-12-28 LAB — DIFFERENTIAL
Basophils Absolute: 0 10*3/uL (ref 0.0–0.1)
Basophils Relative: 0 % (ref 0–1)
Eosinophils Absolute: 0.1 10*3/uL (ref 0.0–0.7)
Neutro Abs: 4 10*3/uL (ref 1.7–7.7)
Neutrophils Relative %: 49 % (ref 43–77)

## 2011-12-28 LAB — SEDIMENTATION RATE: Sed Rate: 21 mm/hr (ref 0–22)

## 2011-12-28 NOTE — Progress Notes (Signed)
Labs drawn today for cbc/diff,cmp,ldh,sedrate,ferr

## 2011-12-31 ENCOUNTER — Encounter (HOSPITAL_BASED_OUTPATIENT_CLINIC_OR_DEPARTMENT_OTHER): Payer: No Typology Code available for payment source | Admitting: Oncology

## 2011-12-31 ENCOUNTER — Encounter (HOSPITAL_COMMUNITY): Payer: Self-pay | Admitting: Oncology

## 2011-12-31 VITALS — BP 150/79 | HR 72 | Temp 98.0°F | Resp 18 | Wt 161.4 lb

## 2011-12-31 DIAGNOSIS — D509 Iron deficiency anemia, unspecified: Secondary | ICD-10-CM

## 2011-12-31 DIAGNOSIS — C8589 Other specified types of non-Hodgkin lymphoma, extranodal and solid organ sites: Secondary | ICD-10-CM

## 2011-12-31 DIAGNOSIS — D473 Essential (hemorrhagic) thrombocythemia: Secondary | ICD-10-CM

## 2011-12-31 DIAGNOSIS — D75839 Thrombocytosis, unspecified: Secondary | ICD-10-CM

## 2011-12-31 DIAGNOSIS — C8590 Non-Hodgkin lymphoma, unspecified, unspecified site: Secondary | ICD-10-CM

## 2011-12-31 NOTE — Patient Instructions (Signed)
[facility]  Discharge Instructions  RECOMMENDATIONS MADE BY THE CONSULTANT AND ANY TEST RESULTS WILL BE SENT TO YOUR REFERRING DOCTOR.   EXAM FINDINGS BY MD TODAY AND SIGNS AND SYMPTOMS TO REPORT TO CLINIC OR PRIMARY MD: Exam findings as discussed by T. Kefalas, PA-C.  SPECIAL INSTRUCTIONS/FOLLOW-UP: 1.  You will be scheduled for labs in 3 and 6 months. 2.  Return in 6 months for an office visit with Dr. Mariel Sleet as scheduled.  I acknowledge that I have been informed and understand all the instructions given to me and received a copy. I do not have any more questions at this time, but understand that I may call the Specialty Clinic at [facility] at [phone] during business hours should I have any further questions or need assistance in obtaining follow-up care.    __________________________________________  _____________  __________ Signature of Patient or Authorized Representative            Date                   Time    __________________________________________ Nurse's Signature

## 2011-12-31 NOTE — Progress Notes (Signed)
Leslie Gully, MD [address] [place] [address]  1. ANEMIA, IRON DEFICIENCY   2. NHL (non-Hodgkin's lymphoma)     CURRENT THERAPY:S/P intermittent Feraheme infusion. S/P 6 cycles of R-CHOP for NHL.   INTERVAL HISTORY: Leslie Williamson 78 y.o. female returns for  regular  visit for followup of iron deficiency anemia and NHL.    Leslie Williamson is doing very well. She went on a cruise in the middle of October to [place]. She had a wonderful time.  Leslie Williamson reports that she's feeling great. She denies any complaints. Her only concern are the moles she has her on her body. These appear to be seborrheic keratoses. This is likely a hereditary issue for her.  I personally reviewed and went over laboratory results with the patient.  Her laboratory work is very stable. Her white blood cell count and hemoglobin are within normal limits. Her platelet count is elevated at 585,000. Her ferritin is stable but at the lower limits of normal approximately 27. Her LDH is well within normal limits and 175.  She denies any B. Symptomatology.  Complete ROS questioning is negative.  I spent time with the patient discussing her laboratory work as mentioned above. She does understand that she does have a mildly elevated platelet count. As result, we work this up further with her next laboratory work. We will add a JAK2 mutation test in addition to further iron studies.  Leslie Williamson asks when she can be released from the clinic. I'll defer this to Dr. Mariel Sleet. She does see her primary care physician, Dr. Felecia Shelling, every 3 months.  Past Medical History  Diagnosis Date  . Iron deficiency anemia   . 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 Hypercholesteremia   . Obesity   . Hypokalemia   . PVD (peripheral vascular disease)   . Anxiety   . Depression   . NHL (non-Hodgkin's lymphoma) 12/17/2010    has HYPERCHOLESTEROLEMIA;  HYPERLIPIDEMIA; ANEMIA, IRON DEFICIENCY; THROMBOCYTOSIS; ANXIETY; CARPAL TUNNEL SYNDROME; HYPERTENSION; PERIPHERAL VASCULAR DISEASE; ALLERGIC RHINITIS; GERD; SEBORRHEIC KERATOSIS; WEIGHT LOSS, ABNORMAL; COLONIC POLYPS, ADENOMATOUS, HX OF; and NHL (non-Hodgkin's lymphoma) on her problem list.     is allergic to penicillins.  Leslie Williamson had no medications administered during this visit.  Past Surgical History  Procedure Date  . Port-a-cath removal     Denies any headaches, dizziness, double vision, fevers, chills, night sweats, nausea, vomiting, diarrhea, constipation, chest pain, heart palpitations, shortness of breath, blood in stool, black tarry stool, urinary pain, urinary burning, urinary frequency, hematuria.   PHYSICAL EXAMINATION  ECOG PERFORMANCE STATUS: 0 - Asymptomatic  Filed Vitals:   12/31/11 1100  BP: 150/79  Pulse: 72  Temp: 98 F (36.7 C)  Resp: 18    GENERAL:alert, no distress, well nourished, well developed, comfortable, cooperative, obese and smiling SKIN: skin color, texture, turgor are normal, no rashes or significant lesions HEAD: Normocephalic, No masses, lesions, tenderness or abnormalities EYES: normal, Conjunctiva are pink and non-injected EARS: External ears normal OROPHARYNX:lips, buccal mucosa, and tongue normal and mucous membranes are moist  NECK: supple, no adenopathy, trachea midline LYMPH:  no palpable lymphadenopathy, no hepatosplenomegaly BREAST:not examined LUNGS: clear to auscultation and percussion HEART: regular rate & rhythm, no murmurs, no gallops, S1 normal and S2 normal ABDOMEN:abdomen soft, non-tender, obese, normal bowel sounds, no masses or organomegaly and no hepatosplenomegaly BACK: Back symmetric, no curvature. EXTREMITIES:less then 2 second capillary refill, no joint  deformities, effusion, or inflammation, no edema, no skin discoloration, no clubbing, no cyanosis  NEURO: alert & oriented x 3 with fluent speech, no focal  motor/sensory deficits, gait normal    LABORATORY DATA: CBC    Component Value Date/Time   WBC 8.3 12/28/2011 1051   RBC 4.64 12/28/2011 1051   HGB 14.0 12/28/2011 1051   HCT 42.2 12/28/2011 1051   PLT 585* 12/28/2011 1051   MCV 90.9 12/28/2011 1051   MCH 30.2 12/28/2011 1051   MCHC 33.2 12/28/2011 1051   RDW 16.0* 12/28/2011 1051   LYMPHSABS 3.3 12/28/2011 1051   MONOABS 0.9 12/28/2011 1051   EOSABS 0.1 12/28/2011 1051   BASOSABS 0.0 12/28/2011 1051      Chemistry      Component Value Date/Time   NA 138 12/28/2011 1051   K 3.7 12/28/2011 1051   CL 102 12/28/2011 1051   CO2 25 12/28/2011 1051   BUN 13 12/28/2011 1051   CREATININE 0.77 12/28/2011 1051      Component Value Date/Time   CALCIUM 9.3 12/28/2011 1051   ALKPHOS 64 12/28/2011 1051   AST 19 12/28/2011 1051   ALT 8 12/28/2011 1051   BILITOT 0.4 12/28/2011 1051     Results for Leslie Williamson, Leslie Williamson (MRN [id number]) as of 12/31/2011 11:18  Ref. Range 12/28/2011 10:51  LDH Latest Range: 94-250 U/L 175      ASSESSMENT:  1. Iron deficiency anemia with poor oral absorption  2. NHL, with Richter's transformation, S/P 6 cycles of R-CHOP  3. Hypokalemia 4. Chronic thrombocytosis 5. Hereditary, widespread, seborrheic keratoses   PLAN:  1. I personally reviewed and went over laboratory results with the patient. 2. Lab work in 3 months: Cbc diff, CMET, LDH, ESR, Iron/TIBC, Ferritin, Jak2 mutation 3. Lab work in 6 months: CBC diff, LDH, ESR, Ferritin 4. I will defer the question of when the patient to be related to the clinic to Dr. Mariel Sleet. 5. Patient return to clinic in 6 months time for followup   All questions were answered. The patient knows to call the clinic with any problems, questions or concerns. We can certainly see the patient much sooner if necessary.  The patient and plan discussed with Glenford Peers, MD and he is in agreement with the aforementioned.  Kylil Swopes

## 2012-01-27 ENCOUNTER — Encounter (HOSPITAL_COMMUNITY): Payer: No Typology Code available for payment source | Attending: Oncology | Admitting: Oncology

## 2012-01-27 VITALS — BP 131/84 | HR 93 | Temp 98.4°F | Resp 18

## 2012-01-27 DIAGNOSIS — J029 Acute pharyngitis, unspecified: Secondary | ICD-10-CM | POA: Insufficient documentation

## 2012-01-27 DIAGNOSIS — R058 Other specified cough: Secondary | ICD-10-CM

## 2012-01-27 DIAGNOSIS — R059 Cough, unspecified: Secondary | ICD-10-CM | POA: Insufficient documentation

## 2012-01-27 DIAGNOSIS — C8589 Other specified types of non-Hodgkin lymphoma, extranodal and solid organ sites: Secondary | ICD-10-CM

## 2012-01-27 DIAGNOSIS — R05 Cough: Secondary | ICD-10-CM

## 2012-01-27 MED ORDER — AZITHROMYCIN 250 MG PO TABS: ORAL_TABLET | ORAL | Status: DC

## 2012-01-27 MED ORDER — BENZONATATE 100 MG PO CAPS: 100.0000 mg | ORAL_CAPSULE | Freq: Three times a day (TID) | ORAL | Status: DC | PRN

## 2012-01-27 NOTE — Progress Notes (Signed)
Leslie Williamson is seen as a work-in today.    Leslie Williamson reports that she started to feel poor about 3 weeks ago.  She went to her PCP and he gave her some Rx's but she can't remember what they were and she forgot to bring them.  I called [facility] pharmacy and she received Claritin and Robitussin DM.  She has not gotten any better since that time.   She reports that 3 weeks ago she started to feel sick.  She reports a cough that is non-productive.  She admits to feeling feverish at time, but no documented temperatures.  She is afebrile this AM in the clinic.  She also admits to feeling cold and sweating occasionally.  She denies any pain or muscle aches.  Denies any facial pain or puffiness.  She admits to a sore throat.   BP 131/84  Pulse 93  Temp 98.4 F (36.9 C) (Oral)  Resp 18 Gen: Ill-appearing, but not acute.  NAD. Pleasant. HEENT: posterior pharynx erythema.  No facial tenderness on palpation of frontal and maxillary sinuses.  Neck: No lymphadenopathy noted Cardiac: RRR Lungs: CTA B/L Skin: Warm and Dry.  No rashes.  Multiple SKs noted throughout skin.  Extremities: Unremarkable Neuro: No focal deficit noted.  Assessment: 1. Sore throat 2. Cough, non-productive   Plan: 1. Z-Pak as directed.  Patient is allergic to PCN and therefore Augmentin avoided. 2. Tessalon pearls 100 mg TID PRN cough. 3. Both Rx's called in to [facility] 4. Patient will call in 7 days if not better.   Patient and plan will be discussed with Dr. Mariel Sleet in the near future.   KEFALAS,THOMAS

## 2012-03-31 ENCOUNTER — Encounter (HOSPITAL_COMMUNITY): Payer: No Typology Code available for payment source | Attending: Oncology

## 2012-03-31 DIAGNOSIS — C8590 Non-Hodgkin lymphoma, unspecified, unspecified site: Secondary | ICD-10-CM

## 2012-03-31 DIAGNOSIS — D473 Essential (hemorrhagic) thrombocythemia: Secondary | ICD-10-CM | POA: Insufficient documentation

## 2012-03-31 DIAGNOSIS — D509 Iron deficiency anemia, unspecified: Secondary | ICD-10-CM

## 2012-03-31 DIAGNOSIS — D75839 Thrombocytosis, unspecified: Secondary | ICD-10-CM

## 2012-03-31 DIAGNOSIS — C8589 Other specified types of non-Hodgkin lymphoma, extranodal and solid organ sites: Secondary | ICD-10-CM | POA: Insufficient documentation

## 2012-03-31 LAB — COMPREHENSIVE METABOLIC PANEL
ALT: 10 U/L (ref 0–35)
AST: 15 U/L (ref 0–37)
Albumin: 3.8 g/dL (ref 3.5–5.2)
CO2: 27 mEq/L (ref 19–32)
Calcium: 9.3 mg/dL (ref 8.4–10.5)
Chloride: 100 mEq/L (ref 96–112)
Creatinine, Ser: 0.83 mg/dL (ref 0.50–1.10)
GFR calc non Af Amer: 66 mL/min — ABNORMAL LOW (ref >90)
Sodium: 137 mEq/L (ref 135–145)

## 2012-03-31 LAB — IRON AND TIBC
Iron: 49 ug/dL (ref 42–135)
Saturation Ratios: 15 % — ABNORMAL LOW (ref 20–55)
UIBC: 275 ug/dL (ref 125–400)

## 2012-03-31 LAB — SEDIMENTATION RATE: Sed Rate: 5 mm/hr (ref 0–22)

## 2012-03-31 LAB — CBC WITH DIFFERENTIAL/PLATELET
Basophils Absolute: 0 10*3/uL (ref 0.0–0.1)
Basophils Relative: 0 % (ref 0–1)
Eosinophils Relative: 1 % (ref 0–5)
Lymphocytes Relative: 40 % (ref 12–46)
MCHC: 34.2 g/dL (ref 30.0–36.0)
Neutro Abs: 4 10*3/uL (ref 1.7–7.7)
Platelets: 524 10*3/uL — ABNORMAL HIGH (ref 150–400)
RDW: 15.7 % — ABNORMAL HIGH (ref 11.5–15.5)
WBC: 8.2 10*3/uL (ref 4.0–10.5)

## 2012-03-31 LAB — FERRITIN: Ferritin: 20 ng/mL (ref 10–291)

## 2012-03-31 NOTE — Progress Notes (Signed)
Louann Liv presented for labwork. Labs per MD order drawn via Peripheral Line 25 gauge needle inserted in LFA.  Procedure without incident.  Patient tolerated procedure well.

## 2012-04-27 ENCOUNTER — Encounter (HOSPITAL_COMMUNITY): Payer: No Typology Code available for payment source | Attending: Oncology

## 2012-04-27 DIAGNOSIS — C8590 Non-Hodgkin lymphoma, unspecified, unspecified site: Secondary | ICD-10-CM

## 2012-04-27 DIAGNOSIS — C8589 Other specified types of non-Hodgkin lymphoma, extranodal and solid organ sites: Secondary | ICD-10-CM | POA: Insufficient documentation

## 2012-04-27 LAB — SEDIMENTATION RATE: Sed Rate: 2 mm/hr (ref 0–22)

## 2012-04-27 LAB — LACTATE DEHYDROGENASE: LDH: 452 U/L — ABNORMAL HIGH (ref 94–250)

## 2012-04-27 NOTE — Progress Notes (Signed)
Labs drawn today for ldh,sed rate

## 2012-04-28 ENCOUNTER — Other Ambulatory Visit (HOSPITAL_COMMUNITY): Payer: No Typology Code available for payment source

## 2012-05-02 ENCOUNTER — Other Ambulatory Visit (HOSPITAL_COMMUNITY): Payer: Self-pay | Admitting: Oncology

## 2012-05-14 NOTE — Progress Notes (Signed)
[name],[name], MD [address] [place] [address]  NHL (non-Hodgkin's lymphoma) - Plan: CBC with Differential, Comprehensive metabolic panel, Lactate dehydrogenase, Sedimentation rate, Beta 2 microglobuline, serum  ANEMIA, IRON DEFICIENCY  CURRENT THERAPY: Observation and intermittent IV Feraheme per lab results.   INTERVAL HISTORY: Leslie Williamson 79 y.o. 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  She had transformation in January 2000 after presenting initially in November 1996.  Treated in 2000 with R-CHOP (03/01/1998 - 09/08/1998) x 6 cycles and received a complete remission.  AND iron deficiency anemia requiring intermitted IV feraheme.  Leslie Williamson is brought into the clinic earlier than scheduled due to her lab results most recently.  I personally reviewed and went over laboratory results with the patient.  Her LDH has increased from 175 on 12/28/11 to 271 on 03/31/12 to 452 on 04/27/2012.  This is worrisome for recurrence. Her CBC on 03/31/12 was impressive for a mild thrombocytosis only which is chronic.  Platelet and Hgb are WNL. Although her LDH has increased, her ESR has remained within normal limits and very stable.   She denies any complaints including B symptoms (fevers, chills, night sweats, nausea, vomiting, weight loss, early satiety)  She recently started a new BP medication by her PCP.  She does admit to some orthostatic hypotension symptoms and dizziness that resolves with rest.  I will defer that to PCP.   Hematologically, she denies any complaints and ROS questioning is negative.    Past Medical History  Diagnosis Date  . Iron deficiency anemia   . 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 Hypercholesteremia   . Obesity   . Hypokalemia   . PVD (peripheral vascular  disease)   . Anxiety   . Depression   . NHL (non-Hodgkin's lymphoma) 12/17/2010  . Hypertension     has HYPERCHOLESTEROLEMIA; HYPERLIPIDEMIA; ANEMIA, IRON DEFICIENCY; THROMBOCYTOSIS; ANXIETY; CARPAL TUNNEL SYNDROME; HYPERTENSION; PERIPHERAL VASCULAR DISEASE; ALLERGIC RHINITIS; GERD; SEBORRHEIC KERATOSIS; WEIGHT LOSS, ABNORMAL; COLONIC POLYPS, ADENOMATOUS, HX OF; and NHL (non-Hodgkin's lymphoma) on her problem list.     is allergic to penicillins.  Ms. Rys had no medications administered during this visit.  Past Surgical History  Procedure Laterality Date  . Port-a-cath removal      Denies any headaches, dizziness, double vision, fevers, chills, night sweats, nausea, vomiting, diarrhea, constipation, chest pain, heart palpitations, shortness of breath, blood in stool, black tarry stool, urinary pain, urinary burning, urinary frequency, hematuria.   PHYSICAL EXAMINATION  ECOG PERFORMANCE STATUS: 1 - Symptomatic but completely ambulatory  Filed Vitals:   05/15/12 1142  BP: 128/67  Pulse: 82  Temp:   Resp: 20    GENERAL:alert, no distress, well nourished, well developed, comfortable, cooperative, obese and smiling SKIN: skin color, texture, turgor are normal, no rashes multiple SKs throughout body. HEAD: Normocephalic, No masses, lesions, tenderness or abnormalities EYES: normal, Conjunctiva are pink and non-injected EARS: External ears normal OROPHARYNX:mucous membranes are moist  NECK: supple, no adenopathy, thyroid normal size, non-tender, without nodularity, no stridor, non-tender, trachea midline LYMPH:  no palpable lymphadenopathy, no hepatosplenomegaly BREAST:not examined LUNGS: clear to auscultation and percussion HEART: regular rate & rhythm, no murmurs, no gallops, S1 normal and S2 normal ABDOMEN:abdomen soft, non-tender, obese, normal bowel sounds, no masses or organomegaly and no hepatosplenomegaly BACK:  Back symmetric, no curvature., No CVA  tenderness EXTREMITIES:less then 2 second capillary refill, no joint deformities, effusion, or inflammation, no edema, no skin discoloration, no clubbing, no cyanosis  NEURO: alert & oriented x 3 with fluent speech, no focal motor/sensory deficits, gait normal   LABORATORY DATA: CBC    Component Value Date/Time   WBC 8.2 03/31/2012 0954   RBC 4.32 03/31/2012 0954   HGB 13.3 03/31/2012 0954   HCT 38.9 03/31/2012 0954   PLT 524* 03/31/2012 0954   MCV 90.0 03/31/2012 0954   MCH 30.8 03/31/2012 0954   MCHC 34.2 03/31/2012 0954   RDW 15.7* 03/31/2012 0954   LYMPHSABS 3.3 03/31/2012 0954   MONOABS 0.8 03/31/2012 0954   EOSABS 0.1 03/31/2012 0954   BASOSABS 0.0 03/31/2012 0954      Chemistry      Component Value Date/Time   NA 137 03/31/2012 0954   K 3.1* 03/31/2012 0954   CL 100 03/31/2012 0954   CO2 27 03/31/2012 0954   BUN 9 03/31/2012 0954   CREATININE 0.83 03/31/2012 0954      Component Value Date/Time   CALCIUM 9.3 03/31/2012 0954   ALKPHOS 61 03/31/2012 0954   AST 15 03/31/2012 0954   ALT 10 03/31/2012 0954   BILITOT 0.4 03/31/2012 0954     Lab Results  Component Value Date   IRON 49 03/31/2012   TIBC 324 03/31/2012   FERRITIN 20 03/31/2012   Results for Leslie Williamson, Leslie Williamson (MRN [id number]) as of 05/14/2012 21:07  Ref. Range 12/28/2011 10:51 03/31/2012 09:54 04/27/2012 11:10  Sed Rate Latest Range: 0-22 mm/hr 21 5 2     Results for Leslie Williamson, Leslie Williamson (MRN [id number]) as of 05/14/2012 21:07  Ref. Range 12/28/2011 10:51 03/31/2012 09:54 04/27/2012 11:10  LDH Latest Range: 94-250 U/L 175 271 (H) 452 (H)      ASSESSMENT:  1. NHL, Richter's transformation from a low grade lymphoma of the spleen to a large cell high grade B cell lymphoma (CD20 positive).  She had transformation in January 2000 after presenting initially in November 1996.  Treated in 2000 with R-CHOP (03/01/1998 - 09/08/1998) x 6 cycles and received a complete remission.  Recent elevation in LDH  2. Iron deficiency anemia with poor oral absorption  3. Hypokalemia   4. Chronic thrombocytosis  5. Hereditary, widespread, seborrheic keratoses   PLAN:  1. I personally reviewed and went over laboratory results with the patient. 2. Labs today: CBC diff, CMET, LDH, ESR, B-2 microglobulin 3. Pending labs today, will schedule the patient for a PET scan if LDH remains elevated 4. Return as scheduled for now.  Will alter the appointment plan pending results.    All questions were answered. The patient knows to call the clinic with any problems, questions or concerns. We can certainly see the patient much sooner if necessary.  The patient and plan discussed with Glenford Peers, MD and he is in agreement with the aforementioned.  Leslie Williamson

## 2012-05-15 ENCOUNTER — Telehealth (HOSPITAL_COMMUNITY): Payer: Self-pay

## 2012-05-15 ENCOUNTER — Encounter (HOSPITAL_BASED_OUTPATIENT_CLINIC_OR_DEPARTMENT_OTHER): Payer: No Typology Code available for payment source | Admitting: Oncology

## 2012-05-15 ENCOUNTER — Encounter (HOSPITAL_COMMUNITY): Payer: Self-pay | Admitting: Oncology

## 2012-05-15 ENCOUNTER — Telehealth (HOSPITAL_COMMUNITY): Payer: Self-pay | Admitting: Oncology

## 2012-05-15 ENCOUNTER — Other Ambulatory Visit (HOSPITAL_COMMUNITY): Payer: Self-pay | Admitting: Oncology

## 2012-05-15 VITALS — BP 128/67 | HR 82 | Temp 98.2°F | Resp 20 | Wt 160.8 lb

## 2012-05-15 DIAGNOSIS — E876 Hypokalemia: Secondary | ICD-10-CM

## 2012-05-15 DIAGNOSIS — D509 Iron deficiency anemia, unspecified: Secondary | ICD-10-CM

## 2012-05-15 DIAGNOSIS — C8590 Non-Hodgkin lymphoma, unspecified, unspecified site: Secondary | ICD-10-CM

## 2012-05-15 DIAGNOSIS — C8589 Other specified types of non-Hodgkin lymphoma, extranodal and solid organ sites: Secondary | ICD-10-CM

## 2012-05-15 DIAGNOSIS — D473 Essential (hemorrhagic) thrombocythemia: Secondary | ICD-10-CM

## 2012-05-15 LAB — CBC WITH DIFFERENTIAL/PLATELET
Hemoglobin: 15.1 g/dL — ABNORMAL HIGH (ref 12.0–15.0)
Lymphocytes Relative: 43 % (ref 12–46)
Lymphs Abs: 3.8 10*3/uL (ref 0.7–4.0)
Monocytes Relative: 10 % (ref 3–12)
Neutro Abs: 4 10*3/uL (ref 1.7–7.7)
Neutrophils Relative %: 46 % (ref 43–77)
Platelets: 528 10*3/uL — ABNORMAL HIGH (ref 150–400)
RBC: 4.95 MIL/uL (ref 3.87–5.11)
WBC: 8.8 10*3/uL (ref 4.0–10.5)

## 2012-05-15 LAB — COMPREHENSIVE METABOLIC PANEL
ALT: 10 U/L (ref 0–35)
BUN: 15 mg/dL (ref 6–23)
CO2: 26 mEq/L (ref 19–32)
Chloride: 99 mEq/L (ref 96–112)
Glucose, Bld: 107 mg/dL — ABNORMAL HIGH (ref 70–99)
Potassium: 3.8 mEq/L (ref 3.5–5.1)
Sodium: 137 mEq/L (ref 135–145)
Total Protein: 7.3 g/dL (ref 6.0–8.3)

## 2012-05-15 LAB — SEDIMENTATION RATE: Sed Rate: 2 mm/hr (ref 0–22)

## 2012-05-15 LAB — LACTATE DEHYDROGENASE: LDH: 337 U/L — ABNORMAL HIGH (ref 94–250)

## 2012-05-15 NOTE — Patient Instructions (Addendum)
[facility] Discharge Instructions  RECOMMENDATIONS MADE BY THE CONSULTANT AND ANY TEST RESULTS WILL BE SENT TO YOUR REFERRING PHYSICIAN.  Lab work today. Keep all appointments as schedule. Your lab results will determine whether or not we send you for a PET scan. We will be in touch with you after we get lab results back.  Thank you for choosing [facility] to provide your oncology and hematology care.  To afford each patient quality time with our providers, please arrive at least 15 minutes before your scheduled appointment time.  With your help, our goal is to use those 15 minutes to complete the necessary work-up to ensure our physicians have the information they need to help with your evaluation and healthcare recommendations.    Effective January 1st, 2014, we ask that you re-schedule your appointment with our physicians should you arrive 10 or more minutes late for your appointment.  We strive to give you quality time with our providers, and arriving late affects you and other patients whose appointments are after yours.    Again, thank you for choosing [facility].  Our hope is that these requests will decrease the amount of time that you wait before being seen by our physicians.       _____________________________________________________________  Should you have questions after your visit to [facility], please contact our office at [phone] between the hours of 8:30 a.m. and 5:00 p.m.  Voicemails left after 4:30 p.m. will not be returned until the following business day.  For prescription refill requests, have your pharmacy contact our office with your prescription refill request.

## 2012-05-15 NOTE — Telephone Encounter (Signed)
Patient notified regarding PET scan schedule 3/31 at [facility].  Instructed nothing to eat or drink 6 hours prior to scan - especially nothing with sugar in it, no gum, hard candy, mints, etc.  Verbalized understanding.  Patient wants to know if PA will call in something for her nerves.  Stated "Since he told me today I've been worried and upset and think I need something to calm me down."  Uses [employer].

## 2012-05-15 NOTE — Telephone Encounter (Signed)
I personally reviewed and went over laboratory results with the patient.  LDH is 337 which is abnormally elevated.   Will pursue a PET scan as a result to evaluate for recurrence of lymphoma.   Communication sent to scheduler and prior authorization specialist  Kaytelynn Scripter

## 2012-05-16 ENCOUNTER — Other Ambulatory Visit (HOSPITAL_COMMUNITY): Payer: Self-pay | Admitting: Oncology

## 2012-05-16 DIAGNOSIS — F411 Generalized anxiety disorder: Secondary | ICD-10-CM

## 2012-05-16 LAB — BETA 2 MICROGLOBULIN, SERUM: Beta-2 Microglobulin: 1.76 mg/L — ABNORMAL HIGH (ref 1.01–1.73)

## 2012-05-16 MED ORDER — ALPRAZOLAM 0.5 MG PO TABS: 0.5000 mg | ORAL_TABLET | Freq: Three times a day (TID) | ORAL | Status: AC | PRN

## 2012-05-16 NOTE — Telephone Encounter (Signed)
Called in Xanax to [facility]

## 2012-05-16 NOTE — Telephone Encounter (Signed)
Pt notified that Xanax has been called in and the specific instructions on the label.

## 2012-05-22 ENCOUNTER — Encounter (HOSPITAL_COMMUNITY)
Admission: RE | Admit: 2012-05-22 | Discharge: 2012-05-22 | Disposition: A | Discharge status: Home / Self Care | Payer: No Typology Code available for payment source | Source: Ambulatory Visit | Attending: Oncology | Admitting: Oncology

## 2012-05-22 DIAGNOSIS — C8590 Non-Hodgkin lymphoma, unspecified, unspecified site: Secondary | ICD-10-CM

## 2012-05-22 DIAGNOSIS — C8589 Other specified types of non-Hodgkin lymphoma, extranodal and solid organ sites: Secondary | ICD-10-CM | POA: Insufficient documentation

## 2012-05-22 MED ORDER — FLUDEOXYGLUCOSE F - 18 (FDG) INJECTION
19.5000 | Freq: Once | INTRAVENOUS | Status: AC | PRN
  Administered 2012-05-22: 19.5 via INTRAVENOUS

## 2012-05-24 ENCOUNTER — Encounter (HOSPITAL_COMMUNITY): Payer: Self-pay | Admitting: Oncology

## 2012-05-24 ENCOUNTER — Encounter (HOSPITAL_COMMUNITY): Payer: No Typology Code available for payment source | Attending: Oncology | Admitting: Oncology

## 2012-05-24 VITALS — BP 130/77 | HR 81 | Temp 98.4°F | Resp 18 | Wt 163.5 lb

## 2012-05-24 DIAGNOSIS — D759 Disease of blood and blood-forming organs, unspecified: Secondary | ICD-10-CM | POA: Insufficient documentation

## 2012-05-24 DIAGNOSIS — C8589 Other specified types of non-Hodgkin lymphoma, extranodal and solid organ sites: Secondary | ICD-10-CM

## 2012-05-24 DIAGNOSIS — R7401 Elevation of levels of liver transaminase levels: Secondary | ICD-10-CM

## 2012-05-24 DIAGNOSIS — C8590 Non-Hodgkin lymphoma, unspecified, unspecified site: Secondary | ICD-10-CM

## 2012-05-24 DIAGNOSIS — R7402 Elevation of levels of lactic acid dehydrogenase (LDH): Secondary | ICD-10-CM

## 2012-05-24 DIAGNOSIS — D509 Iron deficiency anemia, unspecified: Secondary | ICD-10-CM | POA: Insufficient documentation

## 2012-05-24 NOTE — Patient Instructions (Addendum)
[facility] Discharge Instructions  RECOMMENDATIONS MADE BY THE CONSULTANT AND ANY TEST RESULTS WILL BE SENT TO YOUR REFERRING PHYSICIAN.  EXAM FINDINGS BY THE PHYSICIAN TODAY AND SIGNS OR SYMPTOMS TO REPORT TO CLINIC OR PRIMARY PHYSICIAN: Exam and discussion by MD.  Not sure why your LDH is elevated.  PET scan was ok.  We will recheck your blood work in 4 weeks and see you afterwards.  MEDICATIONS PRESCRIBED:  none  INSTRUCTIONS GIVEN AND DISCUSSED: Report fevers, infections, night sweats, etc.  SPECIAL INSTRUCTIONS/FOLLOW-UP:  Follow-up in 4 weeks.  Thank you for choosing [facility] to provide your oncology and hematology care.  To afford each patient quality time with our providers, please arrive at least 15 minutes before your scheduled appointment time.  With your help, our goal is to use those 15 minutes to complete the necessary work-up to ensure our physicians have the information they need to help with your evaluation and healthcare recommendations.    Effective January 1st, 2014, we ask that you re-schedule your appointment with our physicians should you arrive 10 or more minutes late for your appointment.  We strive to give you quality time with our providers, and arriving late affects you and other patients whose appointments are after yours.    Again, thank you for choosing [facility].  Our hope is that these requests will decrease the amount of time that you wait before being seen by our physicians.       _____________________________________________________________  Should you have questions after your visit to [facility], please contact our office at [phone] between the hours of 8:30 a.m. and 5:00 p.m.  Voicemails left after 4:30 p.m. will not be returned until the following business day.  For prescription refill requests, have your pharmacy contact our office with your prescription refill request.

## 2012-05-24 NOTE — Progress Notes (Signed)
The patient has a history of low-grade lymphoma which eventually transform to do a high-grade B-cell lymphoma requiring intravenous chemotherapy. She received chemotherapy years ago and she did achieve a complete remission. More recently her laboratory tests have shown a distinct rise in her LDH level. We have not been able to pinpoint the reason behind the increased. Her PET scan the other day showed no activity consistent with lymphoma. There was physiologic activity of the colon. She had colonoscopy in 2008. She cannot remember when they told her come back.  Her review of systems from an oncology standpoint is negative once again without fevers chills night sweats lumps or bumps anywhere etc. She feels great. She did have a URI 2 months ago.  BP 130/77  Pulse 81  Temp(Src) 98.4 F (36.9 C) (Oral)  Resp 18  Wt 163 lb 8 oz (74.163 kg)  BMI 30.91 kg/m2  She is in no acute distress. Once again she has no lymphadenopathy in the cervical, supraclavicular, infraclavicular, axillary, epitrochlear, or inguinal areas. She has no obvious hepatomegaly. Lungs are clear. Heart shows a regular rhythm and rate. Breast exam is negative for masses. She has no leg or arm edema.  I will repeat her blood work in may see her back then. If we come up with nothing we may refer her back to Dr. Kendell Bane. I want to continue her coming every 4 weeks if need be until his lab work is in the normal range.

## 2012-06-20 ENCOUNTER — Other Ambulatory Visit (HOSPITAL_COMMUNITY): Payer: Self-pay | Admitting: Oncology

## 2012-06-20 DIAGNOSIS — E876 Hypokalemia: Secondary | ICD-10-CM

## 2012-06-20 MED ORDER — POTASSIUM CHLORIDE ER 10 MEQ PO TBCR: 10.0000 meq | EXTENDED_RELEASE_TABLET | Freq: Every day | ORAL | Status: DC

## 2012-06-30 ENCOUNTER — Other Ambulatory Visit (HOSPITAL_COMMUNITY): Payer: Self-pay | Admitting: Oncology

## 2012-06-30 ENCOUNTER — Encounter (HOSPITAL_COMMUNITY): Payer: No Typology Code available for payment source | Attending: Oncology

## 2012-06-30 DIAGNOSIS — D473 Essential (hemorrhagic) thrombocythemia: Secondary | ICD-10-CM | POA: Insufficient documentation

## 2012-06-30 DIAGNOSIS — D759 Disease of blood and blood-forming organs, unspecified: Secondary | ICD-10-CM | POA: Insufficient documentation

## 2012-06-30 DIAGNOSIS — C8590 Non-Hodgkin lymphoma, unspecified, unspecified site: Secondary | ICD-10-CM

## 2012-06-30 DIAGNOSIS — M25559 Pain in unspecified hip: Secondary | ICD-10-CM | POA: Insufficient documentation

## 2012-06-30 DIAGNOSIS — N390 Urinary tract infection, site not specified: Secondary | ICD-10-CM

## 2012-06-30 DIAGNOSIS — D75839 Thrombocytosis, unspecified: Secondary | ICD-10-CM

## 2012-06-30 DIAGNOSIS — C8589 Other specified types of non-Hodgkin lymphoma, extranodal and solid organ sites: Secondary | ICD-10-CM | POA: Insufficient documentation

## 2012-06-30 DIAGNOSIS — D509 Iron deficiency anemia, unspecified: Secondary | ICD-10-CM

## 2012-06-30 LAB — CBC WITH DIFFERENTIAL/PLATELET
Basophils Relative: 0 % (ref 0–1)
Eosinophils Absolute: 0.1 10*3/uL (ref 0.0–0.7)
MCH: 31 pg (ref 26.0–34.0)
MCHC: 34.1 g/dL (ref 30.0–36.0)
Monocytes Relative: 10 % (ref 3–12)
Neutrophils Relative %: 45 % (ref 43–77)
Platelets: 519 10*3/uL — ABNORMAL HIGH (ref 150–400)

## 2012-06-30 LAB — URINE MICROSCOPIC-ADD ON

## 2012-06-30 LAB — URINALYSIS, ROUTINE W REFLEX MICROSCOPIC
Glucose, UA: NEGATIVE mg/dL
Nitrite: NEGATIVE
Protein, ur: NEGATIVE mg/dL

## 2012-06-30 LAB — RETICULOCYTES: RBC.: 4.78 MIL/uL (ref 3.87–5.11)

## 2012-06-30 LAB — LACTATE DEHYDROGENASE: LDH: 186 U/L (ref 94–250)

## 2012-06-30 MED ORDER — SULFAMETHOXAZOLE-TRIMETHOPRIM 800-160 MG PO TABS: 1.0000 | ORAL_TABLET | Freq: Two times a day (BID) | ORAL | Status: DC

## 2012-06-30 NOTE — Progress Notes (Signed)
Labs drawn today for methylmalonic acid,retic,ferr,cbc/diff,ldh,sed rate,urine

## 2012-07-03 ENCOUNTER — Emergency Department (HOSPITAL_COMMUNITY)
Admission: EM | Admit: 2012-07-03 | Discharge: 2012-07-03 | Disposition: A | Discharge status: Home / Self Care | Payer: No Typology Code available for payment source | Attending: Emergency Medicine | Admitting: Emergency Medicine

## 2012-07-03 ENCOUNTER — Encounter (HOSPITAL_COMMUNITY): Payer: Self-pay | Admitting: *Deleted

## 2012-07-03 ENCOUNTER — Emergency Department (HOSPITAL_COMMUNITY): Payer: No Typology Code available for payment source

## 2012-07-03 DIAGNOSIS — M545 Low back pain, unspecified: Secondary | ICD-10-CM | POA: Insufficient documentation

## 2012-07-03 DIAGNOSIS — Z79899 Other long term (current) drug therapy: Secondary | ICD-10-CM | POA: Insufficient documentation

## 2012-07-03 DIAGNOSIS — Z9089 Acquired absence of other organs: Secondary | ICD-10-CM | POA: Insufficient documentation

## 2012-07-03 DIAGNOSIS — Z8639 Personal history of other endocrine, nutritional and metabolic disease: Secondary | ICD-10-CM | POA: Insufficient documentation

## 2012-07-03 DIAGNOSIS — M549 Dorsalgia, unspecified: Secondary | ICD-10-CM

## 2012-07-03 DIAGNOSIS — R109 Unspecified abdominal pain: Secondary | ICD-10-CM

## 2012-07-03 DIAGNOSIS — Z862 Personal history of diseases of the blood and blood-forming organs and certain disorders involving the immune mechanism: Secondary | ICD-10-CM | POA: Insufficient documentation

## 2012-07-03 DIAGNOSIS — I10 Essential (primary) hypertension: Secondary | ICD-10-CM | POA: Insufficient documentation

## 2012-07-03 DIAGNOSIS — Z8719 Personal history of other diseases of the digestive system: Secondary | ICD-10-CM | POA: Insufficient documentation

## 2012-07-03 DIAGNOSIS — Z880 Allergy status to penicillin: Secondary | ICD-10-CM | POA: Insufficient documentation

## 2012-07-03 DIAGNOSIS — Z8679 Personal history of other diseases of the circulatory system: Secondary | ICD-10-CM | POA: Insufficient documentation

## 2012-07-03 DIAGNOSIS — C8589 Other specified types of non-Hodgkin lymphoma, extranodal and solid organ sites: Secondary | ICD-10-CM | POA: Insufficient documentation

## 2012-07-03 DIAGNOSIS — Z87891 Personal history of nicotine dependence: Secondary | ICD-10-CM | POA: Insufficient documentation

## 2012-07-03 DIAGNOSIS — F3289 Other specified depressive episodes: Secondary | ICD-10-CM | POA: Insufficient documentation

## 2012-07-03 DIAGNOSIS — F411 Generalized anxiety disorder: Secondary | ICD-10-CM | POA: Insufficient documentation

## 2012-07-03 DIAGNOSIS — E669 Obesity, unspecified: Secondary | ICD-10-CM | POA: Insufficient documentation

## 2012-07-03 DIAGNOSIS — F329 Major depressive disorder, single episode, unspecified: Secondary | ICD-10-CM | POA: Insufficient documentation

## 2012-07-03 LAB — CBC WITH DIFFERENTIAL/PLATELET
Basophils Absolute: 0 10*3/uL (ref 0.0–0.1)
Eosinophils Absolute: 0.1 10*3/uL (ref 0.0–0.7)
Lymphs Abs: 4.9 10*3/uL — ABNORMAL HIGH (ref 0.7–4.0)
MCH: 30.8 pg (ref 26.0–34.0)
MCHC: 34 g/dL (ref 30.0–36.0)
MCV: 90.7 fL (ref 78.0–100.0)
Monocytes Absolute: 1.2 10*3/uL — ABNORMAL HIGH (ref 0.1–1.0)
Monocytes Relative: 10 % (ref 3–12)
Neutro Abs: 6.1 10*3/uL (ref 1.7–7.7)
Platelets: 527 10*3/uL — ABNORMAL HIGH (ref 150–400)
RDW: 15.7 % — ABNORMAL HIGH (ref 11.5–15.5)
WBC: 12.3 10*3/uL — ABNORMAL HIGH (ref 4.0–10.5)

## 2012-07-03 LAB — BASIC METABOLIC PANEL
BUN: 16 mg/dL (ref 6–23)
Calcium: 9.9 mg/dL (ref 8.4–10.5)
Creatinine, Ser: 1.11 mg/dL — ABNORMAL HIGH (ref 0.50–1.10)
GFR calc Af Amer: 53 mL/min — ABNORMAL LOW (ref >90)
GFR calc non Af Amer: 46 mL/min — ABNORMAL LOW (ref >90)
Glucose, Bld: 103 mg/dL — ABNORMAL HIGH (ref 70–99)

## 2012-07-03 LAB — URINE MICROSCOPIC-ADD ON

## 2012-07-03 LAB — URINALYSIS, ROUTINE W REFLEX MICROSCOPIC
Bilirubin Urine: NEGATIVE
Ketones, ur: NEGATIVE mg/dL
Nitrite: NEGATIVE
Urobilinogen, UA: 0.2 mg/dL (ref 0.0–1.0)

## 2012-07-03 MED ORDER — IOHEXOL 300 MG/ML  SOLN
100.0000 mL | Freq: Once | INTRAMUSCULAR | Status: AC | PRN
  Administered 2012-07-03: 100 mL via INTRAVENOUS

## 2012-07-03 MED ORDER — IOHEXOL 300 MG/ML  SOLN
50.0000 mL | Freq: Once | INTRAMUSCULAR | Status: AC | PRN
  Administered 2012-07-03: 50 mL via ORAL

## 2012-07-03 MED ORDER — OXYCODONE-ACETAMINOPHEN 5-325 MG PO TABS: 1.0000 | ORAL_TABLET | ORAL | Status: DC | PRN

## 2012-07-03 MED ORDER — MORPHINE SULFATE 4 MG/ML IJ SOLN
6.0000 mg | Freq: Once | INTRAMUSCULAR | Status: AC
  Administered 2012-07-03: 6 mg via INTRAVENOUS
  Filled 2012-07-03: qty 2

## 2012-07-03 NOTE — ED Notes (Signed)
C/o back pain, worse with movement

## 2012-07-03 NOTE — ED Provider Notes (Signed)
History     CSN: [id number]  Arrival date & time 07/03/12  1701   First MD Initiated Contact with Patient 07/03/12 1850      Chief Complaint  Patient presents with  . Back Pain    HPI Patient reports developing worsening left low back pain with radiation around her left abdomen and down her left buttock.  No weakness in her lower extremities.  No numbness or tingling in her lower extremities.  No recent fall or trauma.  She does have a history of non-Hodgkin's lymphoma and small cell lymphoma versus plain.  She is status post splenectomy.  She denies dysuria and urinary frequency.  No fevers or chills.  She denies nausea and vomiting.  She reports her back pain is worse with movement.  She's never had his radiating pain around her left side of her abdomen.  No history of kidney stones.  Her pain is moderate in severity at this time.  She's tried tramadol home without improvement in her pain.  Past Medical History  Diagnosis Date  . Iron deficiency anemia   . 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 Hypercholesteremia   . Obesity   . Hypokalemia   . PVD (peripheral vascular disease)   . Anxiety   . Depression   . NHL (non-Hodgkin's lymphoma) 12/17/2010  . Hypertension     Past Surgical History  Procedure Laterality Date  . Port-a-cath removal      Family History  Problem Relation Age of Onset  . Diabetes Mother     History  Substance Use Topics  . Smoking status: Former Games developer  . Smokeless tobacco: Never Used  . Alcohol Use: No    OB History   Grav Para Term Preterm Abortions TAB SAB Ect Mult Living                  Review of Systems  All other systems reviewed and are negative.    Allergies  Penicillins  Home Medications   Current Outpatient Rx  Name  Route  Sig  Dispense  Refill  . ALPRAZolam (XANAX) 0.5 MG tablet   Oral   Take 0.5 mg by mouth 3 (three) times daily as needed for  sleep or anxiety.         Marland Kitchen losartan-hydrochlorothiazide (HYZAAR) 100-12.5 MG per tablet   Oral   Take 1 tablet by mouth daily.         . potassium chloride (K-DUR) 10 MEQ tablet   Oral   Take 1 tablet (10 mEq total) by mouth daily.   60 tablet   2   . sulfamethoxazole-trimethoprim (BACTRIM DS,SEPTRA DS) 800-160 MG per tablet   Oral   Take 1 tablet by mouth 2 (two) times daily.   14 tablet   0   . oxyCODONE-acetaminophen (PERCOCET/ROXICET) 5-325 MG per tablet   Oral   Take 1 tablet by mouth every 4 (four) hours as needed for pain.   20 tablet   0     BP 145/68  Pulse 77  Temp(Src) 98.4 F (36.9 C) (Oral)  Resp 18  Ht 5\' 4"  (1.626 m)  Wt 160 lb (72.576 kg)  BMI 27.45 kg/m2  SpO2 95%  Physical Exam  Nursing note and vitals reviewed. Constitutional: She is oriented to person, place, and time. She appears well-developed and well-nourished. No distress.  HENT:  Head: Normocephalic and atraumatic.  Eyes: EOM  are normal.  Neck: Normal range of motion.  Cardiovascular: Normal rate, regular rhythm and normal heart sounds.   Pulmonary/Chest: Effort normal and breath sounds normal.  Abdominal: Soft. She exhibits no distension. There is no tenderness.  Musculoskeletal: Normal range of motion.  Mild paralumbar tenderness and tenderness in her left sciatic notch.  Normal pulses in her bilateral lower extremity PT and DP arteries.  5 out of 5 strength in bilateral lower extremities  Neurological: She is alert and oriented to person, place, and time.  Skin: Skin is warm and dry.  Psychiatric: She has a normal mood and affect. Judgment normal.    ED Course  Procedures (including critical care time)  Labs Reviewed  CBC WITH DIFFERENTIAL - Abnormal; Notable for the following:    WBC 12.3 (*)    RDW 15.7 (*)    Platelets 527 (*)    Lymphs Abs 4.9 (*)    Monocytes Absolute 1.2 (*)    All other components within normal limits  BASIC METABOLIC PANEL - Abnormal; Notable  for the following:    Glucose, Bld 103 (*)    Creatinine, Ser 1.11 (*)    GFR calc non Af Amer 46 (*)    GFR calc Af Amer 53 (*)    All other components within normal limits  URINALYSIS, ROUTINE W REFLEX MICROSCOPIC - Abnormal; Notable for the following:    APPearance HAZY (*)    Specific Gravity, Urine >1.030 (*)    Hgb urine dipstick TRACE (*)    Leukocytes, UA TRACE (*)    All other components within normal limits  URINE MICROSCOPIC-ADD ON - Abnormal; Notable for the following:    Squamous Epithelial / LPF FEW (*)    Bacteria, UA FEW (*)    Crystals URIC ACID CRYSTALS (*)    All other components within normal limits  URINE CULTURE   Ct Abdomen Pelvis W Contrast  07/03/2012  *RADIOLOGY REPORT*  Clinical Data: Left-sided back and flank pain  CT ABDOMEN AND PELVIS WITH CONTRAST  Technique:  Multidetector CT imaging of the abdomen and pelvis was performed following the standard protocol during bolus administration of intravenous contrast.  Contrast: 50mL OMNIPAQUE IOHEXOL 300 MG/ML  SOLN, OMNIPAQUE IOHEXOL 300 MG/ML  SOLN  Comparison: 05/22/2012 PET-CT  Findings: Hypodensity within the left lower lobe is most in keeping with a biliary cyst or hamartoma.  Post cholecystectomy.  Mild CBD prominence is similar to prior.  No obstructing lesion visualized.  Absent spleen.  Punctate calcifications along the body of the pancreas.  Otherwise, homogeneous attenuation.  Unremarkable adrenal glands.  Bilateral too small to further characterize hypodensities within the kidneys and a interpolar/lower pole cyst on the right, similar to prior.  No hydronephrosis or hydroureter.  Colonic diverticulosis and moderate stool burden.  No CT evidence for colitis or diverticulitis.  Appendix not identified.  No right lower quadrant inflammation.  Small bowel loops are normal course and caliber.  Small amount of fluid along the right pericolic gutter, similar to prior, measures water attenuation.  No free  intraperitoneal air.  No lymphadenopathy. There is scattered atherosclerotic calcification of the aorta and its branches. No aneurysmal dilatation.  Partially decompressed bladder.  Absent uterus.  Asymmetric appearance to the left adnexa is similar to prior.  Multilevel degenerative changes of the imaged spine. Disc osteophyte complex results in severe central canal narrowing at L4- 5.  IMPRESSION: Colonic diverticulosis without CT evidence for diverticulitis.  Small amount of right paracolic gutter fluid  is similar to prior. Asymmetric appearance to the left adnexa is similar to prior.  Status post splenectomy.  Mild bibasilar opacities; atelectasis (favored) versus infiltrate.   Original Report Authenticated By: Jearld Lesch, M.D.    I personally reviewed the imaging tests through PACS system I reviewed available ER/hospitalization records through the EMR   1. Back pain   2. Abdominal pain       MDM  Patient feels much better at this time.  Normal neurologic exam.  CT scan of her abdomen is without significant abnormality.  She has no respiratory symptoms to suggest pneumonia.  I suspect the bibasilar opacities date noted on CT scan are likely atelectasis.  Close PCP followup.  Patient and family understand to return the emergency apartment for new or worsening symptoms        Lyanne Co, MD 07/03/12 2209

## 2012-07-04 ENCOUNTER — Ambulatory Visit (HOSPITAL_COMMUNITY): Payer: No Typology Code available for payment source | Admitting: Oncology

## 2012-07-05 LAB — URINE CULTURE: Culture: NO GROWTH

## 2012-07-06 LAB — METHYLMALONIC ACID, SERUM: Methylmalonic Acid, Quantitative: 0.09 umol/L

## 2012-07-07 ENCOUNTER — Ambulatory Visit (HOSPITAL_COMMUNITY)
Admission: RE | Admit: 2012-07-07 | Discharge: 2012-07-07 | Disposition: A | Discharge status: Home / Self Care | Payer: No Typology Code available for payment source | Source: Ambulatory Visit | Attending: Oncology | Admitting: Oncology

## 2012-07-07 ENCOUNTER — Encounter (HOSPITAL_COMMUNITY): Payer: Self-pay | Admitting: Oncology

## 2012-07-07 ENCOUNTER — Encounter (HOSPITAL_BASED_OUTPATIENT_CLINIC_OR_DEPARTMENT_OTHER): Payer: No Typology Code available for payment source | Admitting: Oncology

## 2012-07-07 VITALS — BP 130/75 | HR 89 | Temp 98.3°F | Resp 18 | Wt 162.9 lb

## 2012-07-07 DIAGNOSIS — M5137 Other intervertebral disc degeneration, lumbosacral region: Secondary | ICD-10-CM | POA: Insufficient documentation

## 2012-07-07 DIAGNOSIS — Z87898 Personal history of other specified conditions: Secondary | ICD-10-CM | POA: Insufficient documentation

## 2012-07-07 DIAGNOSIS — M545 Low back pain, unspecified: Secondary | ICD-10-CM | POA: Insufficient documentation

## 2012-07-07 DIAGNOSIS — M51379 Other intervertebral disc degeneration, lumbosacral region without mention of lumbar back pain or lower extremity pain: Secondary | ICD-10-CM | POA: Insufficient documentation

## 2012-07-07 DIAGNOSIS — M25552 Pain in left hip: Secondary | ICD-10-CM

## 2012-07-07 DIAGNOSIS — M25559 Pain in unspecified hip: Secondary | ICD-10-CM | POA: Insufficient documentation

## 2012-07-07 DIAGNOSIS — C8589 Other specified types of non-Hodgkin lymphoma, extranodal and solid organ sites: Secondary | ICD-10-CM

## 2012-07-07 MED ORDER — PREDNISONE 20 MG PO TABS: ORAL_TABLET | ORAL | Status: DC

## 2012-07-07 NOTE — Patient Instructions (Addendum)
[facility] Discharge Instructions  RECOMMENDATIONS MADE BY THE CONSULTANT AND ANY TEST RESULTS WILL BE SENT TO YOUR REFERRING PHYSICIAN.  EXAM FINDINGS BY THE PHYSICIAN TODAY AND SIGNS OR SYMPTOMS TO REPORT TO CLINIC OR PRIMARY PHYSICIAN: Exam and discussion by MD.  MRI shows that you have acute inflammation of the facet of lumbar disc 3 & 4 with disc space narrowing and causing compression of the nerve.  This is what is causing your pain.  MEDICATIONS PRESCRIBED:  Prednisone as ordered Oxycodone 5/325 take 1 every 4-6 hours as needed. Start taking a stool softener.  You need to have a good BM at least every 3 days.  INSTRUCTIONS GIVEN AND DISCUSSED: Appointment with Dr. Otelia Sergeant in [place] on 6/13 at 8:30am.  Address is: [facility]  [address] (phone # is [phone])  SPECIAL INSTRUCTIONS/FOLLOW-UP: Follow-up in 3 months.  Thank you for choosing [facility] to provide your oncology and hematology care.  To afford each patient quality time with our providers, please arrive at least 15 minutes before your scheduled appointment time.  With your help, our goal is to use those 15 minutes to complete the necessary work-up to ensure our physicians have the information they need to help with your evaluation and healthcare recommendations.    Effective January 1st, 2014, we ask that you re-schedule your appointment with our physicians should you arrive 10 or more minutes late for your appointment.  We strive to give you quality time with our providers, and arriving late affects you and other patients whose appointments are after yours.    Again, thank you for choosing [facility].  Our hope is that these requests will decrease the amount of time that you wait before being seen by our physicians.       _____________________________________________________________  Should you have questions after your visit to [facility], please contact our office at [phone] between the hours of 8:30 a.m. and 5:00 p.m.  Voicemails left after 4:30 p.m. will not be returned until the following business day.  For prescription refill requests, have your pharmacy contact our office with your prescription refill request.

## 2012-07-07 NOTE — Progress Notes (Signed)
#  1 acute onset left inguinal/hip discomfort with some involvement of the buttocks. There is some involvement of the very upper fourth of the thigh area anteriorly. The leg buckles due to the pain. She is having a great deal of difficulty managing ambulation. She was seen in the ED Department 07/03/2012. CT scan did not reveal anything acutely in the abdomen but the lumbar area of solid has degenerative changes and possible nerve root narrowing.  She is a tremendous discomfort with a pain level VII or 8 presently. Was 10 the other day but she is now on Percocet. He has not helped her walking however. She does not have B. symptoms and recently her evaluation for lymphoma was negative.   #2 history of low-grade lymphoma which transformed into a high grade B-cell lymphoma require intravenous therapy and she achieved a complete remission and is out many years.  Her laboratory work from the other night was not remarkable. She does not have fevers chills night sweats. She does not have a history of trauma. But this pain started acutely 7 days ago.  She has no obvious muscle spasticity. She can bend the leg easily but cannot extend it well nor go up stairs easily whatsoever without buckling of the leg. Probably there is no adenopathy in the inguinal areas abdomen is soft and nontender but the left inguinal area is tender to palpation but nothing like when she is trying to walk.  We will get an MRI of her lumbar spine. She may need to be on steroids but I do think you need an orthopedic surgical consultation. She is not a diabetic.  Her MRI has returned showing significant facet joint problems with edema. It also shows herniation of the disc, narrowing of the space and narrowing of the frame and more the left side than the right at L3-4. She also has some problems lower. I have explained to her in detail the problems. We will put her on a tapering schedule of prednisone. She received 80 mg a day for 4 days, 60  mg a day for 4 days, 40 mg a day for 4 days, then 20 mrem at for 4 days. We do have a report Dr. Otelia Sergeant. I have also refilled her Percocet, #60.

## 2012-10-06 ENCOUNTER — Encounter (HOSPITAL_COMMUNITY): Payer: Self-pay | Admitting: Oncology

## 2012-10-06 ENCOUNTER — Encounter (HOSPITAL_COMMUNITY): Payer: No Typology Code available for payment source | Attending: Oncology | Admitting: Oncology

## 2012-10-06 VITALS — BP 122/71 | HR 73 | Temp 97.8°F | Resp 16 | Wt 166.2 lb

## 2012-10-06 DIAGNOSIS — C8589 Other specified types of non-Hodgkin lymphoma, extranodal and solid organ sites: Secondary | ICD-10-CM

## 2012-10-06 DIAGNOSIS — C8590 Non-Hodgkin lymphoma, unspecified, unspecified site: Secondary | ICD-10-CM

## 2012-10-06 DIAGNOSIS — G4700 Insomnia, unspecified: Secondary | ICD-10-CM

## 2012-10-06 DIAGNOSIS — D509 Iron deficiency anemia, unspecified: Secondary | ICD-10-CM

## 2012-10-06 DIAGNOSIS — F411 Generalized anxiety disorder: Secondary | ICD-10-CM

## 2012-10-06 MED ORDER — ALPRAZOLAM 0.5 MG PO TABS
0.5000 mg | ORAL_TABLET | Freq: Every evening | ORAL | Status: DC | PRN
Start: 2012-10-06 — End: 2015-10-25

## 2012-10-06 NOTE — Patient Instructions (Addendum)
[facility] Discharge Instructions  RECOMMENDATIONS MADE BY THE CONSULTANT AND ANY TEST RESULTS WILL BE SENT TO YOUR REFERRING PHYSICIAN.  Labs in 3 months and MD appointment in 3 months. A prescription was given to you for Xanax for sleep. Report any issues/concerns to clinic as needed prior to appointments.  Thank you for choosing [facility] to provide your oncology and hematology care.  To afford each patient quality time with our providers, please arrive at least 15 minutes before your scheduled appointment time.  With your help, our goal is to use those 15 minutes to complete the necessary work-up to ensure our physicians have the information they need to help with your evaluation and healthcare recommendations.    Effective January 1st, 2014, we ask that you re-schedule your appointment with our physicians should you arrive 10 or more minutes late for your appointment.  We strive to give you quality time with our providers, and arriving late affects you and other patients whose appointments are after yours.    Again, thank you for choosing [facility].  Our hope is that these requests will decrease the amount of time that you wait before being seen by our physicians.       _____________________________________________________________  Should you have questions after your visit to [facility], please contact our office at [phone] between the hours of 8:30 a.m. and 5:00 p.m.  Voicemails left after 4:30 p.m. will not be returned until the following business day.  For prescription refill requests, have your pharmacy contact our office with your prescription refill request.

## 2012-10-06 NOTE — Progress Notes (Signed)
[name],[name], MD [address] [place] [address]  NHL (non-Hodgkin's lymphoma) - Plan: CBC with Differential, Comprehensive metabolic panel, Lactate dehydrogenase, Sedimentation rate  ANXIETY - Plan: meclizine (ANTIVERT) 25 MG tablet, ALPRAZolam (XANAX) 0.5 MG tablet  Insomnia - Plan: meclizine (ANTIVERT) 25 MG tablet, ALPRAZolam (XANAX) 0.5 MG tablet  ANEMIA, IRON DEFICIENCY - Plan: Iron and TIBC, Ferritin  CURRENT THERAPY: Surveillance and intermittent IV Feraheme per lab results.    INTERVAL HISTORY: Leslie Williamson 79 y.o. 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 She had transformation in January 2000 after presenting initially in November 1996. Treated in 2000 with R-CHOP (03/01/1998 - 09/08/1998) x 6 cycles and received a complete remission. AND iron deficiency anemia requiring intermitted IV feraheme.  Leslie Williamson had a history of severe back pain according to Dr. Thornton Papas note in May 2014.  "acute onset left inguinal/hip discomfort with some involvement of the buttocks. There is some involvement of the very upper fourth of the thigh area anteriorly. The leg buckles due to the pain. She is having a great deal of difficulty managing ambulation. She was seen in the ED Department 07/03/2012. CT scan did not reveal anything acutely in the abdomen but the lumbar area of solid has degenerative changes and possible nerve root narrowing.  Her MRI has returned showing significant facet joint problems with edema. It also shows herniation of the disc, narrowing of the space and narrowing of the frame and more the left side than the right at L3-4. She also has some problems lower. I have explained to her in detail the problems. We will put her on a tapering schedule of prednisone. She received 80 mg a day for 4 days, 60 mg a day for 4 days, 40 mg a  day for 4 days, then 20 mrem at for 4 days. We do have a report Dr. Otelia Sergeant. I have also refilled her Percocet, #60."   From this standpoint, she is doing very well and her back pain has completely resolved.  She reports that she had a nice experience with Dr. Otelia Sergeant in [place].    She otherwise reports occassional issues with insomnia.  She reports that I gave her some Xanax some months back when we had a scare of NHL recurrence (which was negative).  This really helped with her sleeping.  She asks if she can have a refill of this.  She reports that she rarely uses the medication.  I can provide her with a refill of this medication due to her not using it often.  If I appreciate that she is using this medication every night, I will transfer this part of her care to PCP.   She reports that a few weeks ago she had difficulty with vertigo-like symptoms.  She was seen by her PCP who addressed this issue.   She denies any B symptoms.  Hematologically, she denies any complaints and ROS questioning was negative.   Past Medical History  Diagnosis Date  . Iron deficiency anemia   . 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 Hypercholesteremia   . Obesity   . Hypokalemia   . PVD (peripheral vascular disease)   . Anxiety   . Depression   . NHL (non-Hodgkin's lymphoma) 12/17/2010  . Hypertension   .  Left hip pain   . Vertigo, labyrinthine     has HYPERCHOLESTEROLEMIA; HYPERLIPIDEMIA; ANEMIA, IRON DEFICIENCY; THROMBOCYTOSIS; ANXIETY; CARPAL TUNNEL SYNDROME; HYPERTENSION; PERIPHERAL VASCULAR DISEASE; ALLERGIC RHINITIS; GERD; SEBORRHEIC KERATOSIS; WEIGHT LOSS, ABNORMAL; COLONIC POLYPS, ADENOMATOUS, HX OF; and NHL (non-Hodgkin's lymphoma) on her problem list.     is allergic to penicillins.  [name] had no medications administered during this visit.  Past Surgical History  Procedure Laterality Date  . Port-a-cath removal        Denies any headaches, dizziness, double vision, fevers, chills, night sweats, nausea, vomiting, diarrhea, constipation, chest pain, heart palpitations, shortness of breath, blood in stool, black tarry stool, urinary pain, urinary burning, urinary frequency, hematuria.   PHYSICAL EXAMINATION  ECOG PERFORMANCE STATUS: 0 - Asymptomatic  Filed Vitals:   10/06/12 1400  BP: 122/71  Pulse: 73  Temp: 97.8 F (36.6 C)  Resp: 16    GENERAL:alert, no distress, well nourished, well developed, comfortable, cooperative, obese and smiling SKIN: skin color, texture, turgor are normal, no rashes or significant lesions, many SK's HEAD: Normocephalic, No masses, lesions, tenderness or abnormalities EYES: normal, PERRLA, EOMI, Conjunctiva are pink and non-injected EARS: External ears normal OROPHARYNX:mucous membranes are moist  NECK: supple, no adenopathy, thyroid normal size, non-tender, without nodularity, no stridor, non-tender, trachea midline LYMPH:  no palpable lymphadenopathy, no hepatosplenomegaly BREAST:not examined LUNGS: clear to auscultation and percussion HEART: regular rate & rhythm, no murmurs, no gallops, S1 normal and S2 normal ABDOMEN:abdomen soft, non-tender, obese, normal bowel sounds, no masses or organomegaly and no hepatosplenomegaly BACK: Back symmetric, no curvature., No CVA tenderness EXTREMITIES:less then 2 second capillary refill, no joint deformities, effusion, or inflammation, no edema, no skin discoloration, no clubbing, no cyanosis  NEURO: alert & oriented x 3 with fluent speech, no focal motor/sensory deficits, gait normal    LABORATORY DATA: CBC    Component Value Date/Time   WBC 12.3* 07/03/2012 1936   RBC 4.74 07/03/2012 1936   RBC 4.78 06/30/2012 1008   HGB 14.6 07/03/2012 1936   HCT 43.0 07/03/2012 1936   PLT 527* 07/03/2012 1936   MCV 90.7 07/03/2012 1936   MCH 30.8 07/03/2012 1936   MCHC 34.0 07/03/2012 1936   RDW 15.7* 07/03/2012 1936   LYMPHSABS 4.9*  07/03/2012 1936   MONOABS 1.2* 07/03/2012 1936   EOSABS 0.1 07/03/2012 1936   BASOSABS 0.0 07/03/2012 1936     RADIOGRAPHIC STUDIES:  07/07/2012  *RADIOLOGY REPORT*  Clinical Data: History of non-Hodgkins lymphoma. Acute left thigh  pain.  MRI LUMBAR SPINE WITHOUT CONTRAST  Technique: Multiplanar and multiecho pulse sequences of the lumbar  spine were obtained without intravenous contrast.  Comparison: CT 07/03/2012.  Findings: There is mild disc degeneration with bulging of the discs  at L2-3 and above. No compressive stenosis. The distal cord and  conus are normal with the conus tip at upper L1.  L3-4: There is bilateral facet arthropathy with edematous change.  This allows anterolisthesis of 4 mm. There is shallow broad-based  protrusion of disc material slightly more prominent towards the  left. There is stenosis of both lateral recesses and both neural  foramina. Foraminal narrowing is particularly marked on the left  where the L3 nerve root appears to be compressed.  L4-5: There is chronic disc degeneration with loss of disc height  that and prominent endplate osteophytes and annular bulging. There  is facet degeneration and ligamentous hypertrophy. There is  multifactorial spinal stenosis, most pronounced in the lateral  recesses. There is neural  foraminal narrowing bilaterally. These  findings appear chronic, but there is potential for nerve root  compression in the lateral recesses and foramina at this level as  well.  L5-S1: This level has some transitional features. The disc is  unremarkable. There is mild facet degeneration. No stenosis.  IMPRESSION:  Transitional L5-S1 level.  The acute pathology probably relates to the L3-4 level. There is  active facet arthropathy bilaterally with edematous change allowing  4 mm of anterolisthesis. Shallow protrusion of disc material is  more prominent towards the left. There is foraminal stenosis  bilaterally left worse than  right. Left L3 nerve root appears to  be compressed and could explain the patient's symptoms.  L4-5 shows more chronic-appearing changes but nonetheless there is  spinal stenosis, lateral recess stenosis and neural foraminal  stenosis that could cause neural compressive symptoms.  Original Report Authenticated By: Paulina Fusi, M.D.     ASSESSMENT:  1. NHL, Richter's transformation from a low grade lymphoma of the spleen to a large cell high grade B cell lymphoma (CD20 positive). She had transformation in January 2000 after presenting initially in November 1996. Treated in 2000 with R-CHOP (03/01/1998 - 09/08/1998) x 6 cycles and received a complete remission. Recent elevation in LDH with negative work-up and resolution of LDH back to normal range. 2. Iron deficiency anemia with poor oral absorption  3. Hypokalemia  4. Chronic thrombocytosis  5. Hereditary, widespread, seborrheic keratoses  Patient Active Problem List   Diagnosis Date Noted  . NHL (non-Hodgkin's lymphoma) 12/17/2010  . WEIGHT LOSS, ABNORMAL 02/05/2009  . COLONIC POLYPS, ADENOMATOUS, HX OF 02/05/2009  . GERD 08/08/2008  . HYPERCHOLESTEROLEMIA 07/19/2008  . ANEMIA, IRON DEFICIENCY 06/12/2008  . ANXIETY 03/20/2007  . HYPERLIPIDEMIA 01/11/2006  . THROMBOCYTOSIS 01/11/2006  . CARPAL TUNNEL SYNDROME 01/11/2006  . HYPERTENSION 01/11/2006  . PERIPHERAL VASCULAR DISEASE 01/11/2006  . ALLERGIC RHINITIS 01/11/2006  . SEBORRHEIC KERATOSIS 01/11/2006     PLAN:  1. I personally reviewed and went over laboratory results with the patient. 2. I personally reviewed and went over radiographic studies with the patient. 3. Will ascertain labs performed on 09/25/2012 at PCP office (Dr. Felecia Shelling) 4. Rx for Xanax 0.5 mg at HS PRN #30 with 2 refills 5. Labs in 3 months: CBC diff, CMET, LDH, ESR 6. Return in 3 months   THERAPY PLAN:  Hematologically, she denies any complaints and ROS questioning is negative. Will continue to monitor with  labs.   All questions were answered. The patient knows to call the clinic with any problems, questions or concerns. We can certainly see the patient much sooner if necessary.  Patient and plan discussed with Dr. Benita Gutter and he is in agreement with the aforementioned.   KEFALAS,THOMAS

## 2013-01-03 ENCOUNTER — Other Ambulatory Visit (HOSPITAL_COMMUNITY): Payer: Self-pay | Admitting: Oncology

## 2013-01-03 DIAGNOSIS — Z139 Encounter for screening, unspecified: Secondary | ICD-10-CM

## 2013-01-10 ENCOUNTER — Encounter (HOSPITAL_COMMUNITY): Payer: Medicare Other | Attending: Hematology and Oncology

## 2013-01-10 DIAGNOSIS — D509 Iron deficiency anemia, unspecified: Secondary | ICD-10-CM

## 2013-01-10 DIAGNOSIS — C8590 Non-Hodgkin lymphoma, unspecified, unspecified site: Secondary | ICD-10-CM

## 2013-01-10 DIAGNOSIS — C8589 Other specified types of non-Hodgkin lymphoma, extranodal and solid organ sites: Secondary | ICD-10-CM

## 2013-01-10 LAB — IRON AND TIBC
Iron: 47 ug/dL (ref 42–135)
Saturation Ratios: 15 % — ABNORMAL LOW (ref 20–55)
TIBC: 310 ug/dL (ref 250–470)
UIBC: 263 ug/dL (ref 125–400)

## 2013-01-10 LAB — CBC WITH DIFFERENTIAL/PLATELET
Basophils Relative: 0 % (ref 0–1)
Eosinophils Absolute: 0.1 10*3/uL (ref 0.0–0.7)
HCT: 43.5 % (ref 36.0–46.0)
Hemoglobin: 14.3 g/dL (ref 12.0–15.0)
MCH: 30.6 pg (ref 26.0–34.0)
MCHC: 32.9 g/dL (ref 30.0–36.0)
Monocytes Relative: 11 % (ref 3–12)
Neutrophils Relative %: 39 % — ABNORMAL LOW (ref 43–77)
Platelets: 561 10*3/uL — ABNORMAL HIGH (ref 150–400)

## 2013-01-10 LAB — COMPREHENSIVE METABOLIC PANEL
AST: 13 U/L (ref 0–37)
Albumin: 3.5 g/dL (ref 3.5–5.2)
Alkaline Phosphatase: 60 U/L (ref 39–117)
BUN: 11 mg/dL (ref 6–23)
Calcium: 9.5 mg/dL (ref 8.4–10.5)
Creatinine, Ser: 0.91 mg/dL (ref 0.50–1.10)
Potassium: 3.5 mEq/L (ref 3.5–5.1)
Sodium: 138 mEq/L (ref 135–145)
Total Protein: 6.8 g/dL (ref 6.0–8.3)

## 2013-01-10 LAB — FERRITIN: Ferritin: 31 ng/mL (ref 10–291)

## 2013-01-10 NOTE — Progress Notes (Signed)
Labs drawn today for sed rate,ldh,cbc/diff,cmp,ferr,Iron and IBC

## 2013-01-11 ENCOUNTER — Ambulatory Visit (HOSPITAL_COMMUNITY)
Admission: RE | Admit: 2013-01-11 | Discharge: 2013-01-11 | Disposition: A | Discharge status: Home / Self Care | Payer: Medicare Other | Source: Ambulatory Visit | Attending: Oncology | Admitting: Oncology

## 2013-01-11 DIAGNOSIS — Z139 Encounter for screening, unspecified: Secondary | ICD-10-CM

## 2013-01-11 DIAGNOSIS — Z1231 Encounter for screening mammogram for malignant neoplasm of breast: Secondary | ICD-10-CM | POA: Insufficient documentation

## 2013-01-12 ENCOUNTER — Encounter (HOSPITAL_COMMUNITY): Payer: Self-pay

## 2013-01-12 ENCOUNTER — Encounter (HOSPITAL_BASED_OUTPATIENT_CLINIC_OR_DEPARTMENT_OTHER): Payer: Medicare Other

## 2013-01-12 VITALS — BP 146/81 | HR 78 | Temp 97.9°F | Resp 18 | Wt 165.8 lb

## 2013-01-12 DIAGNOSIS — C8589 Other specified types of non-Hodgkin lymphoma, extranodal and solid organ sites: Secondary | ICD-10-CM

## 2013-01-12 DIAGNOSIS — D473 Essential (hemorrhagic) thrombocythemia: Secondary | ICD-10-CM

## 2013-01-12 DIAGNOSIS — D75839 Thrombocytosis, unspecified: Secondary | ICD-10-CM

## 2013-01-12 DIAGNOSIS — M5416 Radiculopathy, lumbar region: Secondary | ICD-10-CM

## 2013-01-12 DIAGNOSIS — C8590 Non-Hodgkin lymphoma, unspecified, unspecified site: Secondary | ICD-10-CM

## 2013-01-12 DIAGNOSIS — D509 Iron deficiency anemia, unspecified: Secondary | ICD-10-CM

## 2013-01-12 NOTE — Patient Instructions (Signed)
[facility] Discharge Instructions  RECOMMENDATIONS MADE BY THE CONSULTANT AND ANY TEST RESULTS WILL BE SENT TO YOUR REFERRING PHYSICIAN.  Exam good per MD. Return to clinic in 6 months for lab work and MD appointment. Report any issues/concerns to clinic as needed prior to appointments.  Thank you for choosing [facility] to provide your oncology and hematology care.  To afford each patient quality time with our providers, please arrive at least 15 minutes before your scheduled appointment time.  With your help, our goal is to use those 15 minutes to complete the necessary work-up to ensure our physicians have the information they need to help with your evaluation and healthcare recommendations.    Effective January 1st, 2014, we ask that you re-schedule your appointment with our physicians should you arrive 10 or more minutes late for your appointment.  We strive to give you quality time with our providers, and arriving late affects you and other patients whose appointments are after yours.    Again, thank you for choosing [facility].  Our hope is that these requests will decrease the amount of time that you wait before being seen by our physicians.       _____________________________________________________________  Should you have questions after your visit to [facility], please contact our office at [phone] between the hours of 8:30 a.m. and 5:00 p.m.  Voicemails left after 4:30 p.m. will not be returned until the following business day.  For prescription refill requests, have your pharmacy contact our office with your prescription refill request.

## 2013-01-12 NOTE — Progress Notes (Signed)
[facility]  OFFICE PROGRESS Leslie Kuba, MD [address] [place] [address]  DIAGNOSIS: NHL (non-Hodgkin's lymphoma) - Plan: Comprehensive metabolic panel, Lactate dehydrogenase, Sedimentation rate  Iron deficiency anemia, unspecified - Plan: CBC with Differential, Iron and TIBC, Ferritin  Thrombocytosis secondary to splenectomy  Radiculopathy of lumbar region  Chief Complaint  Patient presents with  . Lymphoma  . Anemia    iron deficiency    CURRENT THERAPY: Watchful expectation.  INTERVAL HISTORY: Leslie Williamson 79 y.o. 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 The patient also has intermittent iron deficiency for which she received intravenous Feraheme.  Appetite is good with no nausea, vomiting, diarrhea, constipation, fever, night sweats, easy satiety, lower extremity swelling or redness, cough, wheezing, nasal drip, earache, skin rash, headache, or seizures. Previously noted back pain is no longer present and was relieved initially with oxycodone which she no longer takes. She has had some left upper extremity discomfort which was again relieved by oral analgesic given by her PCP.  MEDICAL HISTORY: Past Medical History  Diagnosis Date  . Iron deficiency anemia   . 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 Hypercholesteremia   . Obesity   . Hypokalemia   . PVD (peripheral vascular disease)   . Anxiety   . Depression   . NHL (non-Hodgkin's lymphoma) 12/17/2010  . Hypertension   . Left hip pain   . Vertigo, labyrinthine     INTERIM HISTORY: has HYPERCHOLESTEROLEMIA; HYPERLIPIDEMIA; ANEMIA, IRON DEFICIENCY; THROMBOCYTOSIS; ANXIETY; CARPAL TUNNEL SYNDROME;  HYPERTENSION; PERIPHERAL VASCULAR DISEASE; ALLERGIC RHINITIS; GERD; SEBORRHEIC KERATOSIS; WEIGHT LOSS, ABNORMAL; COLONIC POLYPS, ADENOMATOUS, HX OF; and NHL (non-Hodgkin's lymphoma) on her problem list.   79 y.o. female returns for regular visit for followup of Richter's transformation from a low grade lymphoma of the spleen to a large cell high grade B cell lymphoma (CD20 positive). She had transformation in January 2000 after presenting initially in November 1996. Treated in 2000 with R-CHOP (03/01/1998 - 09/08/1998) x 6 cycles and received a complete remission. AND iron deficiency anemia requiring intermitted IV feraheme  ALLERGIES:  is allergic to penicillins.  MEDICATIONS: has a current medication list which includes the following prescription(s): alprazolam, losartan-hydrochlorothiazide, meclizine, oxycodone-acetaminophen, and potassium chloride.  SURGICAL HISTORY:  Past Surgical History  Procedure Laterality Date  . Port-a-cath removal      FAMILY HISTORY: family history includes Diabetes in her mother.  SOCIAL HISTORY:  reports that she has quit smoking. She has never used smokeless tobacco. She reports that she does not drink alcohol or use illicit drugs.  REVIEW OF SYSTEMS:  Other than that discussed above is noncontributory.  PHYSICAL EXAMINATION: ECOG PERFORMANCE STATUS: 0 - Asymptomatic  Blood pressure 146/81, pulse 78, temperature 97.9 F (36.6 C), temperature source Oral, resp. rate 18, weight 165 lb 12.8 oz (75.206 kg).  GENERAL:alert, no distress and comfortable SKIN: skin color, texture, turgor are normal, no rashes or significant lesions. Multi-hyperpigmentation peliosis on the face and scalp. EYES: PERLA; Conjunctiva are pink and non-injected, sclera clear OROPHARYNX:no exudate, no erythema on lips, buccal mucosa, or tongue. NECK: supple, thyroid normal size, non-tender, without nodularity. No masses CHEST: Normal AP diameter with no breast masses. LYMPH:  no palpable  lymphadenopathy in the cervical, axillary or inguinal LUNGS: clear to auscultation and percussion with normal breathing effort HEART: regular rate & rhythm and no murmurs. ABDOMEN:abdomen soft, non-tender and normal bowel sounds MUSCULOSKELETAL:no cyanosis of digits and no clubbing. Range of motion normal.  NEURO: alert & oriented x 3 with fluent speech, no focal motor/sensory deficits   LABORATORY DATA: Infusion on 01/10/2013  Component Date Value Range Status  . WBC 01/10/2013 7.5  4.0 - 10.5 K/uL Final  . RBC 01/10/2013 4.67  3.87 - 5.11 MIL/uL Final  . Hemoglobin 01/10/2013 14.3  12.0 - 15.0 g/dL Final  . HCT 16/11/9602 43.5  36.0 - 46.0 % Final  . MCV 01/10/2013 93.1  78.0 - 100.0 fL Final  . MCH 01/10/2013 30.6  26.0 - 34.0 pg Final  . MCHC 01/10/2013 32.9  30.0 - 36.0 g/dL Final  . RDW 54/10/8117 15.7* 11.5 - 15.5 % Final  . Platelets 01/10/2013 561* 150 - 400 K/uL Final  . Neutrophils Relative % 01/10/2013 39* 43 - 77 % Final  . Neutro Abs 01/10/2013 3.0  1.7 - 7.7 K/uL Final  . Lymphocytes Relative 01/10/2013 48* 12 - 46 % Final  . Lymphs Abs 01/10/2013 3.6  0.7 - 4.0 K/uL Final  . Monocytes Relative 01/10/2013 11  3 - 12 % Final  . Monocytes Absolute 01/10/2013 0.8  0.1 - 1.0 K/uL Final  . Eosinophils Relative 01/10/2013 2  0 - 5 % Final  . Eosinophils Absolute 01/10/2013 0.1  0.0 - 0.7 K/uL Final  . Basophils Relative 01/10/2013 0  0 - 1 % Final  . Basophils Absolute 01/10/2013 0.0  0.0 - 0.1 K/uL Final  . Sodium 01/10/2013 138  135 - 145 mEq/L Final  . Potassium 01/10/2013 3.5  3.5 - 5.1 mEq/L Final  . Chloride 01/10/2013 102  96 - 112 mEq/L Final  . CO2 01/10/2013 26  19 - 32 mEq/L Final  . Glucose, Bld 01/10/2013 98  70 - 99 mg/dL Final  . BUN 14/78/2956 11  6 - 23 mg/dL Final  . Creatinine, Ser 01/10/2013 0.91  0.50 - 1.10 mg/dL Final  . Calcium 21/30/8657 9.5  8.4 - 10.5 mg/dL Final  . Total Protein 01/10/2013 6.8  6.0 - 8.3 g/dL Final  . Albumin 84/69/6295 3.5   3.5 - 5.2 g/dL Final  . AST 28/41/3244 13  0 - 37 U/L Final  . ALT 01/10/2013 10  0 - 35 U/L Final  . Alkaline Phosphatase 01/10/2013 60  39 - 117 U/L Final  . Total Bilirubin 01/10/2013 0.3  0.3 - 1.2 mg/dL Final  . GFR calc non Af Amer 01/10/2013 58* >90 mL/min Final  . GFR calc Af Amer 01/10/2013 68* >90 mL/min Final   Comment: (NOTE)                          The eGFR has been calculated using the CKD EPI equation.                          This calculation has not been validated in all clinical situations.                          eGFR's persistently <90 mL/min signify possible Chronic Kidney  Disease.  . Ferritin 01/10/2013 31  10 - 291 ng/mL Final   Performed at [facility]  . Iron 01/10/2013 47  42 - 135 ug/dL Final  . TIBC 40/98/1191 310  250 - 470 ug/dL Final  . Saturation Ratios 01/10/2013 15* 20 - 55 % Final  . UIBC 01/10/2013 263  125 - 400 ug/dL Final   Performed at [facility]  . LDH 01/10/2013 231  94 - 250 U/L Final  . Sed Rate 01/10/2013 12  0 - 22 mm/hr Final    PATHOLOGY:  Urinalysis    Component Value Date/Time   COLORURINE YELLOW 07/03/2012 1929   APPEARANCEUR HAZY* 07/03/2012 1929   LABSPEC >1.030* 07/03/2012 1929   PHURINE 6.0 07/03/2012 1929   GLUCOSEU NEGATIVE 07/03/2012 1929   HGBUR TRACE* 07/03/2012 1929   HGBUR large 06/19/2007 0845   BILIRUBINUR NEGATIVE 07/03/2012 1929   KETONESUR NEGATIVE 07/03/2012 1929   PROTEINUR NEGATIVE 07/03/2012 1929   UROBILINOGEN 0.2 07/03/2012 1929   NITRITE NEGATIVE 07/03/2012 1929   LEUKOCYTESUR TRACE* 07/03/2012 1929    RADIOGRAPHIC STUDIES: 01/11/2013 bilateral mammography unremarkable.  NM PET Image Restag (PS) Skull Base To Thigh Status: Final result         PACS Images    Show images for NM PET Image Restag (PS) Skull Base To Thigh         Study Result    *RADIOLOGY REPORT*  Clinical Data: Subsequent treatment strategy for non-Hodgkins  lymphoma.  NUCLEAR  MEDICINE PET SKULL BASE TO THIGH  Fasting Blood Glucose: 89  Technique: 19.5 mCi F-18 FDG was injected intravenously. CT data  was obtained and used for attenuation correction and anatomic  localization only. (This was not acquired as a diagnostic CT  examination.) Additional exam technical data entered on  technologist worksheet.  Comparison: CT chest abdomen pelvis dated 02/11/2009  Findings:  Neck: No hypermetabolic lymph nodes in the neck.  Visualized thyroid is enlarged/heterogeneous.  Chest: No hypermetabolic mediastinal or hilar nodes.  No suspicious pulmonary nodules on the CT scan.  Mild cardiomegaly. Coronary atherosclerosis. Atherosclerotic  calcifications of the aortic arch.  Abdomen/Pelvis: Prior splenectomy.  No abnormal hypermetabolic activity within the liver, pancreas, or  adrenal glands.  No hypermetabolic lymph nodes in the abdomen or pelvis.  Focal hypermetabolism within a long segment of underdistended right  colon, likely physiologic.  Status post cholecystectomy. 11 mm right renal cyst (series  2/image 124). Atherosclerotic calcifications of the abdominal  aorta and branch vessels. Extensive colonic diverticulosis,  without associated inflammatory changes. Status post hysterectomy.  Small fat-containing bilateral inguinal hernias.  Skeleton: No focal hypermetabolic activity to suggest skeletal  metastasis.  IMPRESSION:  No findings to suggest lymphomatous involvement in the neck, chest,  abdomen, or pelvis.  Status post splenectomy.  Suspected physiologic hypermetabolism within a long segment of  right colon. Consider colonoscopy if not current.  Original Report Authenticated By: Charline Bills    MR Lumbar Spine Wo Contrast Status: Final result         PACS Images    Show images for MR Lumbar Spine Wo Contrast         Study Result    *RADIOLOGY REPORT*  Clinical Data: History of non-Hodgkins lymphoma. Acute left thigh  pain.  MRI  LUMBAR SPINE WITHOUT CONTRAST  Technique: Multiplanar and multiecho pulse sequences of the lumbar  spine were obtained without intravenous contrast.  Comparison: CT 07/03/2012.  Findings: There is mild disc degeneration with bulging of the  discs  at L2-3 and above. No compressive stenosis. The distal cord and  conus are normal with the conus tip at upper L1.  L3-4: There is bilateral facet arthropathy with edematous change.  This allows anterolisthesis of 4 mm. There is shallow broad-based  protrusion of disc material slightly more prominent towards the  left. There is stenosis of both lateral recesses and both neural  foramina. Foraminal narrowing is particularly marked on the left  where the L3 nerve root appears to be compressed.  L4-5: There is chronic disc degeneration with loss of disc height  that and prominent endplate osteophytes and annular bulging. There  is facet degeneration and ligamentous hypertrophy. There is  multifactorial spinal stenosis, most pronounced in the lateral  recesses. There is neural foraminal narrowing bilaterally. These  findings appear chronic, but there is potential for nerve root  compression in the lateral recesses and foramina at this level as  well.  L5-S1: This level has some transitional features. The disc is  unremarkable. There is mild facet degeneration. No stenosis.  IMPRESSION:  Transitional L5-S1 level.  The acute pathology probably relates to the L3-4 level. There is  active facet arthropathy bilaterally with edematous change allowing  4 mm of anterolisthesis. Shallow protrusion of disc material is  more prominent towards the left. There is foraminal stenosis  bilaterally left worse than right. Left L3 nerve root appears to  be compressed and could explain the patient's symptoms.  L4-5 shows more chronic-appearing changes but nonetheless there is  spinal stenosis, lateral recess stenosis and neural foraminal  stenosis that could cause  neural compressive symptoms.  Original Report Authenticated By: Paulina Fusi,      ASSESSMENT:  #1.NHL, Richter's transformation from a low grade lymphoma of the spleen to a large cell high grade B cell lymphoma (CD20 positive). She had transformation in January 2000 after presenting initially in November 1996. Treated in 2000 with R-CHOP (03/01/1998 - 09/08/1998) x 6 cycles and received a complete remission. #2. Iron deficiency with ferritin of 31 with normal hemoglobin. #3. Thrombocytosis secondary to previous splenectomy.    PLAN:  #1. Review mammogram done yesterday. #2. Patient was told to call should she develop increasing fatigue, fever, night sweats, or NuLev adenopathy. #3. Followup in 6 months with lab tests.   All questions were answered. The patient knows to call the clinic with any problems, questions or concerns. We can certainly see the patient much sooner if necessary.   I spent 25 minutes counseling the patient face to face. The total time spent in the appointment was 30 minutes.    Maurilio Lovely, MD 01/12/2013 9:55 AM

## 2013-02-13 ENCOUNTER — Other Ambulatory Visit (HOSPITAL_COMMUNITY): Payer: Self-pay | Admitting: Internal Medicine

## 2013-02-13 ENCOUNTER — Ambulatory Visit (HOSPITAL_COMMUNITY)
Admission: RE | Admit: 2013-02-13 | Discharge: 2013-02-13 | Disposition: A | Discharge status: Home / Self Care | Payer: Medicare Other | Source: Ambulatory Visit | Attending: Internal Medicine | Admitting: Internal Medicine

## 2013-02-13 DIAGNOSIS — J40 Bronchitis, not specified as acute or chronic: Secondary | ICD-10-CM

## 2013-03-06 ENCOUNTER — Other Ambulatory Visit (HOSPITAL_COMMUNITY): Payer: Self-pay | Admitting: Oncology

## 2013-03-06 DIAGNOSIS — E876 Hypokalemia: Secondary | ICD-10-CM

## 2013-03-06 MED ORDER — POTASSIUM CHLORIDE ER 10 MEQ PO TBCR: 10.0000 meq | EXTENDED_RELEASE_TABLET | Freq: Every day | ORAL | Status: DC

## 2013-07-10 ENCOUNTER — Encounter (HOSPITAL_COMMUNITY): Payer: Medicare Other | Attending: Hematology and Oncology

## 2013-07-10 DIAGNOSIS — D509 Iron deficiency anemia, unspecified: Secondary | ICD-10-CM

## 2013-07-10 DIAGNOSIS — F411 Generalized anxiety disorder: Secondary | ICD-10-CM | POA: Insufficient documentation

## 2013-07-10 DIAGNOSIS — I739 Peripheral vascular disease, unspecified: Secondary | ICD-10-CM | POA: Insufficient documentation

## 2013-07-10 DIAGNOSIS — Z09 Encounter for follow-up examination after completed treatment for conditions other than malignant neoplasm: Secondary | ICD-10-CM | POA: Insufficient documentation

## 2013-07-10 DIAGNOSIS — F329 Major depressive disorder, single episode, unspecified: Secondary | ICD-10-CM | POA: Insufficient documentation

## 2013-07-10 DIAGNOSIS — F3289 Other specified depressive episodes: Secondary | ICD-10-CM | POA: Insufficient documentation

## 2013-07-10 DIAGNOSIS — I10 Essential (primary) hypertension: Secondary | ICD-10-CM | POA: Insufficient documentation

## 2013-07-10 DIAGNOSIS — C8590 Non-Hodgkin lymphoma, unspecified, unspecified site: Secondary | ICD-10-CM

## 2013-07-10 DIAGNOSIS — E7800 Pure hypercholesterolemia, unspecified: Secondary | ICD-10-CM | POA: Insufficient documentation

## 2013-07-10 DIAGNOSIS — Z9221 Personal history of antineoplastic chemotherapy: Secondary | ICD-10-CM | POA: Insufficient documentation

## 2013-07-10 DIAGNOSIS — E785 Hyperlipidemia, unspecified: Secondary | ICD-10-CM | POA: Insufficient documentation

## 2013-07-10 DIAGNOSIS — Z860100 Personal history of colon polyps, unspecified: Secondary | ICD-10-CM | POA: Insufficient documentation

## 2013-07-10 DIAGNOSIS — C8589 Other specified types of non-Hodgkin lymphoma, extranodal and solid organ sites: Secondary | ICD-10-CM | POA: Insufficient documentation

## 2013-07-10 DIAGNOSIS — E669 Obesity, unspecified: Secondary | ICD-10-CM | POA: Insufficient documentation

## 2013-07-10 LAB — COMPREHENSIVE METABOLIC PANEL
ALBUMIN: 3.4 g/dL — AB (ref 3.5–5.2)
ALK PHOS: 60 U/L (ref 39–117)
ALT: 11 U/L (ref 0–35)
AST: 14 U/L (ref 0–37)
BILIRUBIN TOTAL: 0.3 mg/dL (ref 0.3–1.2)
BUN: 11 mg/dL (ref 6–23)
CO2: 27 mEq/L (ref 19–32)
Calcium: 9.1 mg/dL (ref 8.4–10.5)
Chloride: 104 mEq/L (ref 96–112)
Creatinine, Ser: 0.89 mg/dL (ref 0.50–1.10)
GFR calc Af Amer: 69 mL/min — ABNORMAL LOW (ref >90)
GFR calc non Af Amer: 60 mL/min — ABNORMAL LOW (ref >90)
Glucose, Bld: 117 mg/dL — ABNORMAL HIGH (ref 70–99)
Potassium: 3.9 mEq/L (ref 3.7–5.3)
Sodium: 143 mEq/L (ref 137–147)
Total Protein: 6.9 g/dL (ref 6.0–8.3)

## 2013-07-10 LAB — CBC WITH DIFFERENTIAL/PLATELET
BASOS PCT: 0 % (ref 0–1)
Basophils Absolute: 0 10*3/uL (ref 0.0–0.1)
Eosinophils Absolute: 0.1 10*3/uL (ref 0.0–0.7)
Eosinophils Relative: 1 % (ref 0–5)
HCT: 41.1 % (ref 36.0–46.0)
HEMOGLOBIN: 13.4 g/dL (ref 12.0–15.0)
LYMPHS PCT: 44 % (ref 12–46)
Lymphs Abs: 3.8 10*3/uL (ref 0.7–4.0)
MCH: 30.1 pg (ref 26.0–34.0)
MCHC: 32.6 g/dL (ref 30.0–36.0)
MCV: 92.4 fL (ref 78.0–100.0)
MONOS PCT: 11 % (ref 3–12)
Monocytes Absolute: 0.9 10*3/uL (ref 0.1–1.0)
NEUTROS ABS: 3.8 10*3/uL (ref 1.7–7.7)
NEUTROS PCT: 44 % (ref 43–77)
Platelets: 649 10*3/uL — ABNORMAL HIGH (ref 150–400)
RBC: 4.45 MIL/uL (ref 3.87–5.11)
RDW: 15.3 % (ref 11.5–15.5)
WBC: 8.7 10*3/uL (ref 4.0–10.5)

## 2013-07-10 LAB — FERRITIN: Ferritin: 33 ng/mL (ref 10–291)

## 2013-07-10 LAB — IRON AND TIBC
Iron: 44 ug/dL (ref 42–135)
Saturation Ratios: 15 % — ABNORMAL LOW (ref 20–55)
TIBC: 292 ug/dL (ref 250–470)
UIBC: 248 ug/dL (ref 125–400)

## 2013-07-10 LAB — LACTATE DEHYDROGENASE: LDH: 204 U/L (ref 94–250)

## 2013-07-10 LAB — SEDIMENTATION RATE: Sed Rate: 20 mm/hr (ref 0–22)

## 2013-07-10 NOTE — Progress Notes (Signed)
[place], MD [address] [place] [address]  NHL (non-Hodgkin's lymphoma) - Plan: CBC with Differential, CBC with Differential, Comprehensive metabolic panel, Lactate dehydrogenase, Sedimentation rate, Beta 2 microglobuline, serum  Thrombocytosis - Plan: CBC with Differential, Bcr/abl gene rearrangement qnt, PCR, CBC with Differential  Iron deficiency - Plan: CBC with Differential, Ferritin, Iron and TIBC, Anti-parietal antibody, Intrinsic Factor Antibodies, CBC with Differential, Iron and TIBC, Ferritin  CURRENT THERAPY: Surveillance  INTERVAL HISTORY: VERTA Williamson 80 y.o. 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 AND intermittent iron deficiency for which she received intravenous Feraheme.    NHL (non-Hodgkin's lymphoma)   07/19/1995 Pathology Spleen biopsy with resection- low-grade B-cell lymphoma, splenic marginal zone type   02/20/1998 Imaging CT CAP-marked and extensive cervical adenopathy   02/25/1998 Pathology Right cervical lymph node demonstrating large B-cell lymphoma   03/05/1998 Bone Marrow Biopsy No evidence of bone marrow involvement   03/11/1998 - 07/18/1998 Chemotherapy CHOP x 7 cycles   05/09/1998 Imaging CT CAP and neck- slight interval decrease in size of para-aortic adenopathy with interval decrease in right inguinal and bi-iliac adenopathy.  Interval decrease in size and number of enlarged cervical nodes   07/10/1998 Imaging Ct CAP and neck- unremarkable CT of chest. No enlarged retroperitoneal adenopathy withthe previously noted retroperitoneal nodes currently smaller to stable in size.  Cervical adenopathy is stable to slightly smaller in size.   08/18/1998 - 09/08/1998 Chemotherapy Rituxan Day 1, 8, 15, 22 x 1 cycle   01/08/1999 Remission CT CAP and neck  demonstrates no adenopathy or disease   01/08/1999 Imaging CT CAP and neck- Stable CT of chest. Stable CT abd/pelvis without adenopathy.  Negative CT of neck     I personally reviewed and went over laboratory results with the patient.  The results are noted within this dictation.  I note her platelet count remains mildly elevated.  Additionally, I note that her Hgb is WNL, but she is iron deficient with a ferritin in the low 30's.  As a result, I am deeming her iron deficient and I will give her IV Feraheme in the near future.  This may contribute to thrombocytosis as well.    On chart review, the patient is S/P splenectomy as part of her diagnosis in the 1990's and therefore, this could be a cause of her thrombocytosis as well.  I personally reviewed and went over radiographic studies with the patient.  The results are noted within this dictation.  Last mammogram on 01/12/2013 demonstrated a BIRADS 1.  She will therefore be due for her next screening mammogram in November 2015.   She relays a story of a 1 week episode of frontal sinus/head "fuzziness."  It lasted for about 1 week and resolved following church last Sunday.  She recently started Loratadine.  I do not have an explanation for this, but multiple causes are possible, including iron deficiency, allergies/sinusitis, vertigo, etc.  Otherwise, she denies any complaints and hematologic ROS questioning is negative.  She denies any B symptoms.  Past Medical History  Diagnosis Date  . Iron deficiency anemia   . 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 Hypercholesteremia   . Obesity   . Hypokalemia   . PVD (peripheral vascular disease)   .  Anxiety   . Depression   . NHL (non-Hodgkin's lymphoma) 12/17/2010  . Hypertension   . Left hip pain   . Vertigo, labyrinthine     has HYPERCHOLESTEROLEMIA; HYPERLIPIDEMIA; ANEMIA, IRON DEFICIENCY; THROMBOCYTOSIS; ANXIETY;  CARPAL TUNNEL SYNDROME; HYPERTENSION; PERIPHERAL VASCULAR DISEASE; ALLERGIC RHINITIS; GERD; SEBORRHEIC KERATOSIS; WEIGHT LOSS, ABNORMAL; COLONIC POLYPS, ADENOMATOUS, HX OF; and NHL (non-Hodgkin's lymphoma) on her problem list.     is allergic to penicillins.  Leslie Williamson had no medications administered during this visit.  Past Surgical History  Procedure Laterality Date  . Port-a-cath removal      Denies any headaches, dizziness, double vision, fevers, chills, night sweats, nausea, vomiting, diarrhea, constipation, chest pain, heart palpitations, shortness of breath, blood in stool, black tarry stool, urinary pain, urinary burning, urinary frequency, hematuria.   PHYSICAL EXAMINATION  ECOG PERFORMANCE STATUS: 0 - Asymptomatic  Filed Vitals:   07/13/13 1035  BP: 106/67  Pulse: 106  Resp: 18    GENERAL:alert, no distress, well nourished, well developed, comfortable, cooperative, obese and smiling SKIN: skin color, texture, turgor are normal, no rashes or significant lesions, diffuse SKs HEAD: Normocephalic, No masses, lesions, tenderness or abnormalities EYES: normal, PERRLA, EOMI, Conjunctiva are pink and non-injected EARS: External ears normal OROPHARYNX:mucous membranes are moist  NECK: supple, no adenopathy, thyroid normal size, non-tender, without nodularity, no stridor, non-tender, trachea midline LYMPH:  no palpable lymphadenopathy BREAST:not examined LUNGS: clear to auscultation and percussion HEART: regular rate & rhythm, no murmurs, no gallops, S1 normal and S2 normal ABDOMEN:abdomen soft, non-tender, obese and normal bowel sounds BACK: Back symmetric, no curvature., No CVA tenderness EXTREMITIES:less then 2 second capillary refill, no joint deformities, effusion, or inflammation, no edema, no skin discoloration, no clubbing, no cyanosis  NEURO: alert & oriented x 3 with fluent speech, no focal motor/sensory deficits, gait normal    LABORATORY DATA: CBC      Component Value Date/Time   WBC 8.7 07/10/2013 0949   RBC 4.45 07/10/2013 0949   RBC 4.78 06/30/2012 1008   HGB 13.4 07/10/2013 0949   HCT 41.1 07/10/2013 0949   PLT 649* 07/10/2013 0949   MCV 92.4 07/10/2013 0949   MCH 30.1 07/10/2013 0949   MCHC 32.6 07/10/2013 0949   RDW 15.3 07/10/2013 0949   LYMPHSABS 3.8 07/10/2013 0949   MONOABS 0.9 07/10/2013 0949   EOSABS 0.1 07/10/2013 0949   BASOSABS 0.0 07/10/2013 0949      Chemistry      Component Value Date/Time   NA 143 07/10/2013 0949   K 3.9 07/10/2013 0949   CL 104 07/10/2013 0949   CO2 27 07/10/2013 0949   BUN 11 07/10/2013 0949   CREATININE 0.89 07/10/2013 0949      Component Value Date/Time   CALCIUM 9.1 07/10/2013 0949   ALKPHOS 60 07/10/2013 0949   AST 14 07/10/2013 0949   ALT 11 07/10/2013 0949   BILITOT 0.3 07/10/2013 0949     Lab Results  Component Value Date   IRON 44 07/10/2013   TIBC 292 07/10/2013   FERRITIN 33 07/10/2013    Results for MYIA, BERGH (MRN [id number]) as of 07/10/2013 16:52  Ref. Range 07/10/2013 09:49  Sed Rate Latest Range: 0-22 mm/hr 20    Results for SERENNA, DEROY (MRN [id number]) as of 07/10/2013 16:52  Ref. Range 07/10/2013 09:49  LDH Latest Range: 94-250 U/L 204     RADIOGRAPHIC STUDIES:  01/12/2013  CLINICAL DATA: Screening.  EXAM:  DIGITAL SCREENING BILATERAL MAMMOGRAM WITH CAD  COMPARISON:  Previous exam(s).  ACR Breast Density Category b: There are scattered areas of  fibroglandular density.  FINDINGS:  There are no findings suspicious for malignancy. Images were  processed with CAD.  IMPRESSION:  No mammographic evidence of malignancy. A result letter of this  screening mammogram will be mailed directly to the patient.  RECOMMENDATION:  Screening mammogram in one year. (Code:SM-B-01Y)  BI-RADS CATEGORY 1: Negative  Electronically Signed  By: Donavan Burnet M.D.  On: 01/12/2013 19:16    ASSESSMENT:  1. Richter's transformation from a low-grade lymphoma to large cell  high grade B-cell lymphoma treated with 6 cycles of R.-CHOP ending on 09/08/1998 resulting in a complete remission. 2. Intermittent iron deficiency for which she received intravenous Feraheme. 3. Thrombocytosis, negative Jak2 testing.  Likely secondary to splenectomy with possible contribution from iron deficiency.  Patient Active Problem List   Diagnosis Date Noted  . NHL (non-Hodgkin's lymphoma) 12/17/2010  . WEIGHT LOSS, ABNORMAL 02/05/2009  . COLONIC POLYPS, ADENOMATOUS, HX OF 02/05/2009  . GERD 08/08/2008  . HYPERCHOLESTEROLEMIA 07/19/2008  . ANEMIA, IRON DEFICIENCY 06/12/2008  . ANXIETY 03/20/2007  . HYPERLIPIDEMIA 01/11/2006  . THROMBOCYTOSIS 01/11/2006  . CARPAL TUNNEL SYNDROME 01/11/2006  . HYPERTENSION 01/11/2006  . PERIPHERAL VASCULAR DISEASE 01/11/2006  . ALLERGIC RHINITIS 01/11/2006  . SEBORRHEIC KERATOSIS 01/11/2006     PLAN:  1. I personally reviewed and went over laboratory results with the patient.  The results are noted within this dictation. 2. I personally reviewed and went over radiographic studies with the patient.  The results are noted within this dictation.   3. Chart reviewed 4. Next screening mammogram is due in November 2015 5. Labs in 3 months: CBC diff, iron/TIBC, ferritin, anti-parietal cell antibody, Intrinsic factor antibody, BCR/ABL. 6. Labs in 6 months: CBC diff, CMET, LDH, ESR, B2M, iron/TIBC, Ferritin 7. Recommend follow-up with PCP. 8. Patient education regarding iron deficiency 9. Discussed the risks, benefits, alternatives, and side effects (including anaphylaxis and death) of Feraheme. 10. IV Feraheme 1020 mg next week. 11. Follow-up with PCP as directed 12. Return in 6 months for follow-up   THERAPY PLAN:  From a NHL standpoint, she remains in a remission with the present data.  However, she does have a stable thrombocytosis which could be from her history of splenectomy in the 90's.  Labs demonstrate iron deficiency without anemia and  therefore we will correct that with IV Feraheme.  Iron deficiency can also cause a reactive thrombocytosis.  All questions were answered. The patient knows to call the clinic with any problems, questions or concerns. We can certainly see the patient much sooner if necessary.  Patient and plan discussed with Dr. Farrel Gobble and he is in agreement with the aforementioned.   [facility] 07/13/2013

## 2013-07-12 NOTE — Progress Notes (Signed)
Labs drawn

## 2013-07-13 ENCOUNTER — Encounter (HOSPITAL_COMMUNITY): Payer: Self-pay | Admitting: Oncology

## 2013-07-13 ENCOUNTER — Ambulatory Visit (HOSPITAL_COMMUNITY): Payer: No Typology Code available for payment source

## 2013-07-13 ENCOUNTER — Encounter (HOSPITAL_BASED_OUTPATIENT_CLINIC_OR_DEPARTMENT_OTHER): Payer: Medicare Other | Admitting: Oncology

## 2013-07-13 VITALS — BP 106/67 | HR 106 | Resp 18 | Wt 164.8 lb

## 2013-07-13 DIAGNOSIS — D75839 Thrombocytosis, unspecified: Secondary | ICD-10-CM

## 2013-07-13 DIAGNOSIS — D473 Essential (hemorrhagic) thrombocythemia: Secondary | ICD-10-CM

## 2013-07-13 DIAGNOSIS — E611 Iron deficiency: Secondary | ICD-10-CM

## 2013-07-13 DIAGNOSIS — D509 Iron deficiency anemia, unspecified: Secondary | ICD-10-CM

## 2013-07-13 DIAGNOSIS — C8589 Other specified types of non-Hodgkin lymphoma, extranodal and solid organ sites: Secondary | ICD-10-CM

## 2013-07-13 DIAGNOSIS — C8590 Non-Hodgkin lymphoma, unspecified, unspecified site: Secondary | ICD-10-CM

## 2013-07-13 NOTE — Patient Instructions (Addendum)
[facility] Discharge Instructions  RECOMMENDATIONS MADE BY THE CONSULTANT AND ANY TEST RESULTS WILL BE SENT TO YOUR REFERRING PHYSICIAN.  EXAM FINDINGS BY THE PHYSICIAN TODAY AND SIGNS OR SYMPTOMS TO REPORT TO CLINIC OR PRIMARY PHYSICIAN: Exam and findings as discussed by Robynn Pane, PA-C. Iron is a little low and we will give your feraheme next week.  Report night sweats, fevers, unexplained weight loss, etc.     INSTRUCTIONS/FOLLOW-UP: Lab work in 3 and 6 months and office visit in 6 months.   Thank you for choosing [facility] to provide your oncology and hematology care.  To afford each patient quality time with our providers, please arrive at least 15 minutes before your scheduled appointment time.  With your help, our goal is to use those 15 minutes to complete the necessary work-up to ensure our physicians have the information they need to help with your evaluation and healthcare recommendations.    Effective January 1st, 2014, we ask that you re-schedule your appointment with our physicians should you arrive 10 or more minutes late for your appointment.  We strive to give you quality time with our providers, and arriving late affects you and other patients whose appointments are after yours.    Again, thank you for choosing [facility].  Our hope is that these requests will decrease the amount of time that you wait before being seen by our physicians.       _____________________________________________________________  Should you have questions after your visit to [facility], please contact our office at [phone] between the hours of 8:30 a.m. and 5:00 p.m.  Voicemails left after 4:30 p.m. will not be returned until the following business day.  For prescription refill requests, have your pharmacy contact our office with your prescription refill request.

## 2013-07-18 ENCOUNTER — Encounter (HOSPITAL_BASED_OUTPATIENT_CLINIC_OR_DEPARTMENT_OTHER): Payer: Medicare Other

## 2013-07-18 VITALS — BP 111/63 | HR 88 | Resp 18

## 2013-07-18 DIAGNOSIS — E611 Iron deficiency: Secondary | ICD-10-CM

## 2013-07-18 DIAGNOSIS — D509 Iron deficiency anemia, unspecified: Secondary | ICD-10-CM

## 2013-07-18 MED ORDER — SODIUM CHLORIDE 0.9 % IV SOLN
1020.0000 mg | Freq: Once | INTRAVENOUS | Status: AC
  Administered 2013-07-18: 1020 mg via INTRAVENOUS
  Filled 2013-07-18: qty 34

## 2013-07-18 NOTE — Progress Notes (Signed)
Tolerated feraheme infusion well. 

## 2013-07-31 ENCOUNTER — Other Ambulatory Visit (HOSPITAL_COMMUNITY)
Admission: RE | Admit: 2013-07-31 | Discharge: 2013-07-31 | Disposition: A | Discharge status: Home / Self Care | Payer: Medicare Other | Source: Ambulatory Visit | Attending: Orthopaedic Surgery | Admitting: Orthopaedic Surgery

## 2013-07-31 DIAGNOSIS — M6530 Trigger finger, unspecified finger: Secondary | ICD-10-CM | POA: Diagnosis present

## 2013-09-07 ENCOUNTER — Telehealth: Payer: Self-pay | Admitting: Cardiovascular Disease

## 2013-09-11 NOTE — Telephone Encounter (Signed)
Closed encounter °

## 2013-10-05 ENCOUNTER — Encounter (HOSPITAL_COMMUNITY): Payer: Medicare Other

## 2013-10-16 ENCOUNTER — Encounter: Payer: Self-pay | Admitting: Cardiovascular Disease

## 2013-10-16 ENCOUNTER — Encounter (HOSPITAL_COMMUNITY): Payer: Medicare Other | Attending: Hematology and Oncology

## 2013-10-16 ENCOUNTER — Ambulatory Visit (INDEPENDENT_AMBULATORY_CARE_PROVIDER_SITE_OTHER): Payer: Medicare Other | Admitting: Cardiovascular Disease

## 2013-10-16 VITALS — BP 136/74 | HR 96 | Ht 61.5 in | Wt 163.2 lb

## 2013-10-16 DIAGNOSIS — C8590 Non-Hodgkin lymphoma, unspecified, unspecified site: Secondary | ICD-10-CM

## 2013-10-16 DIAGNOSIS — I739 Peripheral vascular disease, unspecified: Secondary | ICD-10-CM

## 2013-10-16 DIAGNOSIS — D75839 Thrombocytosis, unspecified: Secondary | ICD-10-CM

## 2013-10-16 DIAGNOSIS — C8589 Other specified types of non-Hodgkin lymphoma, extranodal and solid organ sites: Secondary | ICD-10-CM | POA: Diagnosis present

## 2013-10-16 DIAGNOSIS — D473 Essential (hemorrhagic) thrombocythemia: Secondary | ICD-10-CM | POA: Diagnosis present

## 2013-10-16 DIAGNOSIS — E611 Iron deficiency: Secondary | ICD-10-CM

## 2013-10-16 DIAGNOSIS — D509 Iron deficiency anemia, unspecified: Secondary | ICD-10-CM | POA: Diagnosis present

## 2013-10-16 LAB — CBC WITH DIFFERENTIAL/PLATELET
Basophils Absolute: 0 10*3/uL (ref 0.0–0.1)
Basophils Relative: 0 % (ref 0–1)
Eosinophils Absolute: 0.1 10*3/uL (ref 0.0–0.7)
Eosinophils Relative: 1 % (ref 0–5)
HCT: 40.3 % (ref 36.0–46.0)
Hemoglobin: 13.6 g/dL (ref 12.0–15.0)
LYMPHS ABS: 3.8 10*3/uL (ref 0.7–4.0)
LYMPHS PCT: 45 % (ref 12–46)
MCH: 31.7 pg (ref 26.0–34.0)
MCHC: 33.7 g/dL (ref 30.0–36.0)
MCV: 93.9 fL (ref 78.0–100.0)
Monocytes Absolute: 1 10*3/uL (ref 0.1–1.0)
Monocytes Relative: 12 % (ref 3–12)
NEUTROS ABS: 3.5 10*3/uL (ref 1.7–7.7)
NEUTROS PCT: 41 % — AB (ref 43–77)
PLATELETS: 576 10*3/uL — AB (ref 150–400)
RBC: 4.29 MIL/uL (ref 3.87–5.11)
RDW: 16 % — ABNORMAL HIGH (ref 11.5–15.5)
WBC: 8.4 10*3/uL (ref 4.0–10.5)

## 2013-10-16 LAB — FERRITIN: FERRITIN: 317 ng/mL — AB (ref 10–291)

## 2013-10-16 LAB — IRON AND TIBC
IRON: 110 ug/dL (ref 42–135)
SATURATION RATIOS: 42 % (ref 20–55)
TIBC: 261 ug/dL (ref 250–470)
UIBC: 151 ug/dL (ref 125–400)

## 2013-10-16 NOTE — Progress Notes (Signed)
Patient ID: Leslie Williamson, female   DOB: Apr 30, 1931, 80 y.o.   MRN: [id number]    OFFICE NOTE  Chief Complaint:  Lower extremity claudication  Primary Care Physician: Leslie Fire, MD  HPI:  Leslie Williamson is an 80 yo female with a history of hypercholesterolemia, PVD, hypertension, and small cell lymphoma being seen for right foot pain. She was last seen by Leslie Leslie Williamson 05/08/09 for similar complaints. At that time she had claudication symptoms for about a year. She had dopplers at that time revealing ABI of 0.78 and a CT angiogram showed infrainguinal and infrapopliteal disease. She had a 2D echo and myoview stress test, both of which were normal. At that time an angiogram revealed 90% focal mid left SFA stenosis and right lower extremity 30-40% segmental mid right SFA stenosis and one vessel runoff via peroneal which had serial 80% stenosis.  She presents today for evaluation of pain in bilateral feet. Notes she has been having worsening pain and numbness in both feet R>L. This has been going on for 6 months. Gets worse the farther she walks and the pain is up to mid shin. She noted pain with walking to the exam room from the waiting room. Pain goes away with stopping walking. States it is the same pain as the last time she saw Leslie Leslie Williamson. Has been on pletal for the past year and this helped some. No prior ulcerations or skin break down. She notes no prior intervention for her known PVD. No history of heart disease or MI. Blood pressure has been under good control. Former smoker quit 30 years ago, less than a PPD for 5-10 years. Denies chest pain, dyspnea, orthopnea, PND, palpitations, syncope. Does note she had vertigo on waking 3 months ago that only happened when she got up in the morning. Has not had issues with this in the past 3 months and has discussed this with Leslie Williamson who she states advised her it was her inner ear. No family history of MI or stroke.   PMHx:  Past Medical History    Diagnosis Date  . Iron deficiency anemia   . 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 Hypercholesteremia   . Obesity   . Hypokalemia   . PVD (peripheral vascular disease)   . Anxiety   . Depression   . NHL (non-Hodgkin's lymphoma) 12/17/2010  . Hypertension   . Left hip pain   . Vertigo, labyrinthine     Past Surgical History  Procedure Laterality Date  . Port-a-cath removal      FAMHx:  Family History  Problem Relation Age of Onset  . Diabetes Mother     SOCHx:   reports that she has quit smoking. She has never used smokeless tobacco. She reports that she does not drink alcohol or use illicit drugs.  ALLERGIES:  Allergies  Allergen Reactions  . Penicillins Rash    ROS: A comprehensive review of systems was negative except for: Cardiovascular: positive for claudication Neurological: positive for numbness in feet  HOME MEDS: Current Outpatient Prescriptions  Medication Sig Dispense Refill  . ALPRAZolam (XANAX) 0.5 MG tablet Take 1 tablet (0.5 mg total) by mouth at bedtime as needed for sleep or anxiety.  30 tablet  2  . cilostazol (PLETAL) 100 MG tablet Take 100 mg by mouth 2 (two) times daily.      Marland Kitchen loratadine (CLARITIN) 10 MG tablet Take 10  mg by mouth daily as needed for allergies.       Marland Kitchen losartan-hydrochlorothiazide (HYZAAR) 100-12.5 MG per tablet Take 1 tablet by mouth daily.       No current facility-administered medications for this visit.    LABS/IMAGING: Results for orders placed in visit on 10/16/13 (from the past 48 hour(s))  CBC WITH DIFFERENTIAL     Status: Abnormal   Collection Time    10/16/13  9:17 AM      Result Value Ref Range   WBC 8.4  4.0 - 10.5 K/uL   RBC 4.29  3.87 - 5.11 MIL/uL   Hemoglobin 13.6  12.0 - 15.0 g/dL   HCT 40.3  36.0 - 46.0 %   MCV 93.9  78.0 - 100.0 fL   MCH 31.7  26.0 - 34.0 pg   MCHC 33.7  30.0 - 36.0 g/dL   RDW 16.0 (*) 11.5 - 15.5 %    Platelets 576 (*) 150 - 400 K/uL   Neutrophils Relative % 41 (*) 43 - 77 %   Neutro Abs 3.5  1.7 - 7.7 K/uL   Lymphocytes Relative 45  12 - 46 %   Lymphs Abs 3.8  0.7 - 4.0 K/uL   Monocytes Relative 12  3 - 12 %   Monocytes Absolute 1.0  0.1 - 1.0 K/uL   Eosinophils Relative 1  0 - 5 %   Eosinophils Absolute 0.1  0.0 - 0.7 K/uL   Basophils Relative 0  0 - 1 %   Basophils Absolute 0.0  0.0 - 0.1 K/uL   No results Williamson.  VITALS: BP 136/74  Pulse 96  Ht 5' 1.5" (1.562 m)  Wt 163 lb 3.2 oz (74.027 kg)  BMI 30.34 kg/m2  EXAM: General appearance: alert, cooperative and no distress Neck: no adenopathy, no carotid bruit, no JVD and supple, symmetrical, trachea midline Lungs: clear to auscultation bilaterally Heart: regular rate and rhythm, S1, S2 normal, no murmur, click, rub or gallop Abdomen: soft, non-tender; bowel sounds normal; no masses,  no organomegaly Extremities: extremities normal, atraumatic, no cyanosis or edema, there is mild tenderness in the mid right fore foot to palpation Pulses: bilateral dorsalis pedis and posterior tibialis pulses absent, left popliteal pulse absent, right popliteal pulse 2+ Skin: Skin color, texture, turgor normal. No rashes or lesions Neurologic: Grossly normal   ASSESSMENT: 1.  Peripheral vascular disease- patient with known stenosis in left SFA and right SFA and arteries below the knee. Now with worsened claudication. 2.  Hypertension- controlled. Followed by Leslie Williamson.  PLAN: 1.  Given the patients progression of symptoms and absence of pulses we suspect that her disease has progressed to the point of likely occlusion. At this time we will obtain lower extremity dopplers to evaluate the lower extremity vasculature. If the vascular disease has progressed and there are occlusions we would likely old off on intervention given her lack of ulceration and skin issues as the risk would outweigh the benefit of this procedure. If her vasculature is  stable we will consider intervention for help in managing her symptoms. She is to follow-up after completion of her dopplers. 2.  Continue current Hyzaar dosing.   Tommi Rumps 10/16/2013, 1:35 PM   Agree with note by Leslie. Caryl Bis  Pt with known PAD, progression of Sx, now Rutherford Class 4. No open wounds. Will recheck LEAs and see back in office.  Lorretta Harp, M.D., [place], [facility], Laverta [place] Jette [address]  [place], [place]  [address]  [phone] 10/19/2013 4:59 PM

## 2013-10-16 NOTE — Patient Instructions (Signed)
Dr Gwenlyn Found has requested that you have a lower extremity arterial duplex. This test is an ultrasound of the arteries in the legs. It looks at arterial blood flow in the legs. Allow one hour for Lower Arterial scans. There are no restrictions or special instructions.  Your physician recommends that you schedule a follow-up appointment in after your dopplers are completed.

## 2013-10-16 NOTE — Progress Notes (Signed)
LABS DRAWN FOR PCAIGG,IFAB,BCRQ,CBCD,FERR,IRON/TIBC

## 2013-10-17 ENCOUNTER — Ambulatory Visit (HOSPITAL_COMMUNITY)
Admission: RE | Admit: 2013-10-17 | Discharge: 2013-10-17 | Disposition: A | Discharge status: Home / Self Care | Payer: Medicare Other | Source: Ambulatory Visit | Attending: Cardiovascular Disease | Admitting: Cardiovascular Disease

## 2013-10-17 DIAGNOSIS — I739 Peripheral vascular disease, unspecified: Secondary | ICD-10-CM | POA: Insufficient documentation

## 2013-10-17 LAB — INTRINSIC FACTOR ANTIBODIES: Intrinsic Factor: NEGATIVE

## 2013-10-17 NOTE — Progress Notes (Signed)
Lower Extremity Arterial Duplex Completed. °Brianna L Mazza,RVT °

## 2013-10-18 LAB — BCR/ABL GENE REARRANGEMENT QNT, PCR
BCR ABL1 / ABL1 IS: 0 %
BCR ABL1 / ABL1: 0 %

## 2013-10-18 LAB — P210 BCR-ABL 1: P210 BCR ABL1: NOT DETECTED

## 2013-10-18 LAB — P190 BCR-ABL 1: P190 BCR ABL1: NOT DETECTED

## 2013-10-18 LAB — ANTI-PARIETAL ANTIBODY: PARIETAL CELL ANTIBODY-IGG: NEGATIVE

## 2013-11-06 ENCOUNTER — Encounter: Payer: Self-pay | Admitting: Cardiology

## 2013-11-06 ENCOUNTER — Ambulatory Visit (INDEPENDENT_AMBULATORY_CARE_PROVIDER_SITE_OTHER): Payer: Medicare Other | Admitting: Cardiology

## 2013-11-06 VITALS — BP 112/68 | HR 80 | Ht 61.0 in | Wt 163.9 lb

## 2013-11-06 DIAGNOSIS — I739 Peripheral vascular disease, unspecified: Secondary | ICD-10-CM

## 2013-11-06 DIAGNOSIS — I10 Essential (primary) hypertension: Secondary | ICD-10-CM

## 2013-11-06 DIAGNOSIS — C8589 Other specified types of non-Hodgkin lymphoma, extranodal and solid organ sites: Secondary | ICD-10-CM

## 2013-11-06 DIAGNOSIS — C8590 Non-Hodgkin lymphoma, unspecified, unspecified site: Secondary | ICD-10-CM

## 2013-11-06 NOTE — Assessment & Plan Note (Signed)
Controlled.  

## 2013-11-06 NOTE — Patient Instructions (Signed)
Follow up with Dr Berry as needed.  

## 2013-11-06 NOTE — Assessment & Plan Note (Signed)
Pt complaining of pain in both legs, from mid calf to foot, Rt > Lt

## 2013-11-06 NOTE — Assessment & Plan Note (Signed)
Dr Lexine Baton follows

## 2013-11-06 NOTE — Progress Notes (Signed)
11/06/2013 Leslie Williamson   Jan 17, 1932  [id number]  Primary Physicia Rosita Fire, MD Primary Cardiologist: Dr Gwenlyn Found  HPI:  80 y/o AA female seen by Dr Gwenlyn Found in 2011 for claudication. Her ABIs were abnormal and she underwent PV angiogram March 2011 that revealed bilateral anterior and posterior tibial artery occlusion as well as 80% Lt SFA disease and 50% Rt SFA disease. Her symptoms were mainly on the Rt so Dr Gwenlyn Found elected to treat her medically. We have not seen her since. She has had an echo and Myoview in Feb 2011 that were normal.            She is in the office today as a referral for complaints of claudication. Doppler done 10/17/13 reveal near total occlusion of her Lt SFA and > 50% Rt SFA. She has known occluded anterior and posterior tibial arteries bilaterally. Her symptoms are exertion mid calf to foot pain. She says its worse with walking and worse on the Rt. Symptoms are relived with rest.            Current Outpatient Prescriptions  Medication Sig Dispense Refill  . ALPRAZolam (XANAX) 0.5 MG tablet Take 1 tablet (0.5 mg total) by mouth at bedtime as needed for sleep or anxiety.  30 tablet  2  . cilostazol (PLETAL) 100 MG tablet Take 100 mg by mouth 2 (two) times daily.      Marland Kitchen loratadine (CLARITIN) 10 MG tablet Take 10 mg by mouth daily as needed for allergies.       Marland Kitchen losartan-hydrochlorothiazide (HYZAAR) 100-12.5 MG per tablet Take 1 tablet by mouth daily.      . meclizine (ANTIVERT) 25 MG tablet Take 25 mg by mouth as needed.        No current facility-administered medications for this visit.    Allergies  Allergen Reactions  . Penicillins Rash    History   Social History  . Marital Status: Widowed    Spouse Name: N/A    Number of Children: N/A  . Years of Education: N/A   Occupational History  . Not on file.   Social History Main Topics  . Smoking status: Former Research scientist (life sciences)  . Smokeless tobacco: Never Used  . Alcohol Use: No  . Drug Use: No  . Sexual  Activity: No   Other Topics Concern  . Not on file   Social History Narrative  . No narrative on file     Review of Systems: General: negative for chills, fever, night sweats or weight changes.  Cardiovascular: negative for chest pain, dyspnea on exertion, edema, orthopnea, palpitations, paroxysmal nocturnal dyspnea or shortness of breath Dermatological: negative for rash Respiratory: negative for cough or wheezing Urologic: negative for hematuria Abdominal: negative for nausea, vomiting, diarrhea, bright red blood per rectum, melena, or hematemesis Neurologic: negative for visual changes, syncope, or dizziness All other systems reviewed and are otherwise negative except as noted above.    Blood pressure 112/68, pulse 80, height 5\' 1"  (1.549 m), weight 163 lb 14.4 oz (74.345 kg).  General appearance: alert, cooperative, no distress and moderately obese Lungs: clear to auscultation bilaterally Heart: regular rate and rhythm Extremities: diminnished pulses bilaterally, no wounds or ulcers noted. Both LE warm to touch.  EKG NSR, LAD  ASSESSMENT AND PLAN:   Claudication of both lower extremities Pt complaining of pain in both legs, from mid calf to foot, Rt > Lt  NHL (non-Hodgkin's lymphoma) Dr Lexine Baton follows  HTN Controlled   PLAN  The pt was seen and examined with Dr Gwenlyn Found today in the office. Options are limited with bilateral tibial disease with one vessel run off. Dr Gwenlyn Found is not convinced all of her symptoms are from claudication. He does not feel a tibial vessel intervention is feasible, and is only indicated for non healing vascular wounds and critical limb ischemia. We explained this to the pt and her niece who accompanied her today. We suggested she continue the Pletal and walk for exercise as tolerated. We can see her PRN.   Leslie Williamson KPA-C 11/06/2013 3:21 PM

## 2013-12-13 ENCOUNTER — Other Ambulatory Visit (HOSPITAL_COMMUNITY): Payer: Self-pay | Admitting: Oncology

## 2013-12-13 DIAGNOSIS — Z1231 Encounter for screening mammogram for malignant neoplasm of breast: Secondary | ICD-10-CM

## 2013-12-28 ENCOUNTER — Encounter (HOSPITAL_COMMUNITY): Payer: Medicare Other | Attending: Hematology and Oncology

## 2013-12-28 DIAGNOSIS — Z9081 Acquired absence of spleen: Secondary | ICD-10-CM | POA: Insufficient documentation

## 2013-12-28 DIAGNOSIS — E611 Iron deficiency: Secondary | ICD-10-CM | POA: Insufficient documentation

## 2013-12-28 DIAGNOSIS — C8590 Non-Hodgkin lymphoma, unspecified, unspecified site: Secondary | ICD-10-CM | POA: Diagnosis present

## 2013-12-28 DIAGNOSIS — R7989 Other specified abnormal findings of blood chemistry: Secondary | ICD-10-CM | POA: Insufficient documentation

## 2013-12-28 DIAGNOSIS — D473 Essential (hemorrhagic) thrombocythemia: Secondary | ICD-10-CM | POA: Diagnosis present

## 2013-12-28 DIAGNOSIS — D509 Iron deficiency anemia, unspecified: Secondary | ICD-10-CM | POA: Insufficient documentation

## 2013-12-28 DIAGNOSIS — D75839 Thrombocytosis, unspecified: Secondary | ICD-10-CM

## 2013-12-28 LAB — COMPREHENSIVE METABOLIC PANEL
ALT: 9 U/L (ref 0–35)
AST: 12 U/L (ref 0–37)
Albumin: 3.5 g/dL (ref 3.5–5.2)
Alkaline Phosphatase: 65 U/L (ref 39–117)
Anion gap: 14 (ref 5–15)
BUN: 18 mg/dL (ref 6–23)
CHLORIDE: 102 meq/L (ref 96–112)
CO2: 26 meq/L (ref 19–32)
CREATININE: 0.99 mg/dL (ref 0.50–1.10)
Calcium: 9.3 mg/dL (ref 8.4–10.5)
GFR calc Af Amer: 61 mL/min — ABNORMAL LOW (ref >90)
GFR, EST NON AFRICAN AMERICAN: 52 mL/min — AB (ref >90)
Glucose, Bld: 123 mg/dL — ABNORMAL HIGH (ref 70–99)
Potassium: 3.1 mEq/L — ABNORMAL LOW (ref 3.7–5.3)
Sodium: 142 mEq/L (ref 137–147)
Total Bilirubin: 0.3 mg/dL (ref 0.3–1.2)
Total Protein: 7.2 g/dL (ref 6.0–8.3)

## 2013-12-28 LAB — LACTATE DEHYDROGENASE: LDH: 158 U/L (ref 94–250)

## 2013-12-28 LAB — CBC WITH DIFFERENTIAL/PLATELET
Basophils Absolute: 0 10*3/uL (ref 0.0–0.1)
Basophils Relative: 0 % (ref 0–1)
Eosinophils Absolute: 0.2 10*3/uL (ref 0.0–0.7)
Eosinophils Relative: 2 % (ref 0–5)
HEMATOCRIT: 41.5 % (ref 36.0–46.0)
HEMOGLOBIN: 14 g/dL (ref 12.0–15.0)
LYMPHS PCT: 41 % (ref 12–46)
Lymphs Abs: 4.2 10*3/uL — ABNORMAL HIGH (ref 0.7–4.0)
MCH: 31.7 pg (ref 26.0–34.0)
MCHC: 33.7 g/dL (ref 30.0–36.0)
MCV: 93.9 fL (ref 78.0–100.0)
MONO ABS: 1 10*3/uL (ref 0.1–1.0)
Monocytes Relative: 9 % (ref 3–12)
NEUTROS PCT: 48 % (ref 43–77)
Neutro Abs: 4.9 10*3/uL (ref 1.7–7.7)
Platelets: 600 10*3/uL — ABNORMAL HIGH (ref 150–400)
RBC: 4.42 MIL/uL (ref 3.87–5.11)
RDW: 14.8 % (ref 11.5–15.5)
WBC: 10.2 10*3/uL (ref 4.0–10.5)

## 2013-12-28 LAB — IRON AND TIBC
IRON: 50 ug/dL (ref 42–135)
Saturation Ratios: 20 % (ref 20–55)
TIBC: 244 ug/dL — ABNORMAL LOW (ref 250–470)
UIBC: 194 ug/dL (ref 125–400)

## 2013-12-28 LAB — SEDIMENTATION RATE: Sed Rate: 10 mm/hr (ref 0–22)

## 2013-12-28 LAB — FERRITIN: FERRITIN: 288 ng/mL (ref 10–291)

## 2013-12-28 NOTE — Progress Notes (Signed)
LABS FOR [facility]

## 2013-12-31 LAB — BETA 2 MICROGLOBULIN, SERUM: Beta-2 Microglobulin: 2.75 mg/L — ABNORMAL HIGH (ref <=2.51)

## 2014-01-01 ENCOUNTER — Other Ambulatory Visit (HOSPITAL_COMMUNITY): Payer: Self-pay | Admitting: Oncology

## 2014-01-01 ENCOUNTER — Encounter (HOSPITAL_COMMUNITY): Payer: Self-pay | Admitting: Oncology

## 2014-01-01 DIAGNOSIS — Z9081 Acquired absence of spleen: Secondary | ICD-10-CM

## 2014-01-01 DIAGNOSIS — E876 Hypokalemia: Secondary | ICD-10-CM

## 2014-01-01 HISTORY — DX: Acquired absence of spleen: Z90.81

## 2014-01-01 MED ORDER — POTASSIUM CHLORIDE CRYS ER 20 MEQ PO TBCR: 20.0000 meq | EXTENDED_RELEASE_TABLET | Freq: Every day | ORAL | Status: DC

## 2014-01-07 NOTE — Progress Notes (Signed)
Hardwick, MD [place] [address]  NHL (non-Hodgkin's lymphoma) - Plan: CBC with Differential, Comprehensive metabolic panel, Lactate dehydrogenase, Sedimentation rate, Beta 2 microglobuline, serum, C-reactive protein  Iron deficiency anemia - Plan: CBC with Differential, Iron and TIBC, Ferritin  Postsplenectomy thrombocytosis - Plan: CBC with Differential  S/P splenectomy  CURRENT THERAPY: Surveillance per NCCN guidelines  INTERVAL HISTORY: Leslie Williamson 80 y.o. 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 AND   intermittent iron deficiency for which she received intravenous Feraheme.    NHL (non-Hodgkin's lymphoma)   07/19/1995 Pathology Results Spleen biopsy with resection- low-grade B-cell lymphoma, splenic marginal zone type   02/20/1998 Imaging CT CAP-marked and extensive cervical adenopathy   02/25/1998 Pathology Results Right cervical lymph node demonstrating large B-cell lymphoma   03/05/1998 Bone Marrow Biopsy No evidence of bone marrow involvement   03/11/1998 - 07/18/1998 Chemotherapy CHOP x 7 cycles   05/09/1998 Imaging CT CAP and neck- slight interval decrease in size of para-aortic adenopathy with interval decrease in right inguinal and bi-iliac adenopathy.  Interval decrease in size and number of enlarged cervical nodes   07/10/1998 Imaging Ct CAP and neck- unremarkable CT of chest. No enlarged retroperitoneal adenopathy withthe previously noted retroperitoneal nodes currently smaller to stable in size.  Cervical adenopathy is stable to slightly smaller in size.   08/18/1998 - 09/08/1998 Chemotherapy Rituxan Day 1, 8, 15, 22 x 1 cycle   01/08/1999 Remission CT CAP and neck demonstrates no adenopathy or disease   01/08/1999 Imaging CT CAP and neck- Stable CT of  chest. Stable CT abd/pelvis without adenopathy.  Negative CT of neck   I personally reviewed and went over laboratory results with the patient.  The results are noted within this dictation.  She denies any B symptoms including fevers, chills, night sweats, unintentional weight loss, and decreased appetite.    Hematologically, she denies any complaints and ROS questioning is negative.   Past Medical History  Diagnosis Date  . Iron deficiency anemia   . 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 Hypercholesteremia   . Obesity   . Hypokalemia   . PVD (peripheral vascular disease)   . Anxiety   . Depression   . NHL (non-Hodgkin's lymphoma) 12/17/2010  . Hypertension   . Left hip pain   . Vertigo, labyrinthine   . S/P splenectomy 01/01/2014    has HYPERLIPIDEMIA; Iron deficiency anemia; Postsplenectomy thrombocytosis; ANXIETY; CARPAL TUNNEL SYNDROME; HTN; PVD- know Lt SFA disease, bilat PT and AT disease; ALLERGIC RHINITIS; GERD; SEBORRHEIC KERATOSIS; WEIGHT LOSS, ABNORMAL; COLONIC POLYPS, ADENOMATOUS, HX OF; NHL (non-Hodgkin's lymphoma); Claudication of both lower extremities; and S/P splenectomy on her problem list.     is allergic to penicillins.  Ms. Halle does not currently have medications on file.  Past Surgical History  Procedure Laterality Date  . Port-a-cath removal      Denies any headaches, dizziness, double vision, fevers, chills, night sweats, nausea, vomiting, diarrhea, constipation, chest pain, heart palpitations, shortness of breath, blood in stool, black tarry stool, urinary pain, urinary burning, urinary frequency, hematuria.   PHYSICAL EXAMINATION  ECOG PERFORMANCE STATUS: 0 - Asymptomatic  Filed Vitals:   01/11/14 1021  BP: 133/57  Pulse: 80  Temp: 98.3  F (36.8 C)  Resp: 18    GENERAL:alert, no distress, well nourished, well developed, comfortable, cooperative and smiling SKIN:  skin color, texture, turgor are normal, many SKs throughout body HEAD: Normocephalic, No masses, lesions, tenderness or abnormalities EYES: normal, PERRLA, EOMI, Conjunctiva are pink and non-injected EARS: External ears normal OROPHARYNX:mucous membranes are moist  NECK: supple, no adenopathy, thyroid normal size, non-tender, without nodularity, no stridor, non-tender, trachea midline LYMPH:  no palpable lymphadenopathy, no hepatosplenomegaly BREAST:not examined LUNGS: clear to auscultation and percussion HEART: regular rate & rhythm, no murmurs, no gallops, S1 normal and S2 normal ABDOMEN:abdomen soft, non-tender, obese, normal bowel sounds, no masses or organomegaly and no hepatosplenomegaly BACK: Back symmetric, no curvature., No CVA tenderness EXTREMITIES:less then 2 second capillary refill, no joint deformities, effusion, or inflammation, no edema, no skin discoloration, no clubbing, no cyanosis  NEURO: alert & oriented x 3 with fluent speech, no focal motor/sensory deficits, gait normal   LABORATORY DATA: CBC    Component Value Date/Time   WBC 10.2 12/28/2013 1026   RBC 4.42 12/28/2013 1026   RBC 4.78 06/30/2012 1008   HGB 14.0 12/28/2013 1026   HCT 41.5 12/28/2013 1026   PLT 600* 12/28/2013 1026   MCV 93.9 12/28/2013 1026   MCH 31.7 12/28/2013 1026   MCHC 33.7 12/28/2013 1026   RDW 14.8 12/28/2013 1026   LYMPHSABS 4.2* 12/28/2013 1026   MONOABS 1.0 12/28/2013 1026   EOSABS 0.2 12/28/2013 1026   BASOSABS 0.0 12/28/2013 1026      Chemistry      Component Value Date/Time   NA 142 12/28/2013 1026   K 3.1* 12/28/2013 1026   CL 102 12/28/2013 1026   CO2 26 12/28/2013 1026   BUN 18 12/28/2013 1026   CREATININE 0.99 12/28/2013 1026      Component Value Date/Time   CALCIUM 9.3 12/28/2013 1026   ALKPHOS 65 12/28/2013 1026   AST 12 12/28/2013 1026   ALT 9 12/28/2013 1026   BILITOT 0.3 12/28/2013 1026     Lab Results  Component Value Date   IRON 50 12/28/2013    TIBC 244* 12/28/2013   FERRITIN 288 12/28/2013    Results for PHYNIX, HORTON (MRN [id number]) as of 01/07/2014 16:32  Ref. Range 12/28/2013 10:26  LDH Latest Range: 94-250 U/L 158   Results for CHIYOKO, TORRICO (MRN [id number]) as of 01/07/2014 16:32  Ref. Range 12/28/2013 10:26  Beta-2 Microglobulin Latest Range: <=2.51 mg/L 2.75 (H)   Results for PAKOU, RAINBOW (MRN [id number]) as of 01/07/2014 16:32  Ref. Range 12/28/2013 10:26  Sed Rate Latest Range: 0-22 mm/hr 10      ASSESSMENT:  1. 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 2. Intermittent iron deficiency for which she received intravenous Feraheme. 3. Thrombocytosis, secondary to splenectomy, JAK2 negative.  Patient Active Problem List   Diagnosis Date Noted  . S/P splenectomy 01/01/2014  . Claudication of both lower extremities 11/06/2013  . NHL (non-Hodgkin's lymphoma) 12/17/2010  . WEIGHT LOSS, ABNORMAL 02/05/2009  . COLONIC POLYPS, ADENOMATOUS, HX OF 02/05/2009  . GERD 08/08/2008  . Iron deficiency anemia 06/12/2008  . ANXIETY 03/20/2007  . HYPERLIPIDEMIA 01/11/2006  . Postsplenectomy thrombocytosis 01/11/2006  . CARPAL TUNNEL SYNDROME 01/11/2006  . HTN 01/11/2006  . PVD- know Lt SFA disease, bilat PT and AT disease 01/11/2006  . ALLERGIC RHINITIS 01/11/2006  . SEBORRHEIC KERATOSIS 01/11/2006  PLAN:  1. I personally reviewed and went over laboratory results with the patient.  The results are noted within this dictation. 2. Chart reviewed 3. Mammogram is due this month and scheduled for 01/14/2014. 4. Labs in 6 months: CBC diff, iron/TIBC, Ferritin 5. Labs in 12 months: CBC diff, iron/TIBC, Ferritin, soluble transferrin receptor 6. Return in 12 months for follow-up   THERAPY PLAN:  NCCN guidelines recommends the follow surveillance for Stage I-IV NHL for those  who attain a complete response to therapy:  A. H&P every 3-6 months for 5 years, then yearly or as clinically indicated.  B. Labs every 3-6 months for 5 years and then annually or as clinically indicated.  C. Repeat CT scans only as clinically indicated.    All questions were answered. The patient knows to call the clinic with any problems, questions or concerns. We can certainly see the patient much sooner if necessary.  Patient and plan discussed with Dr. Farrel Gobble and he is in agreement with the aforementioned.   Duvall Comes 01/11/2014

## 2014-01-11 ENCOUNTER — Encounter (HOSPITAL_COMMUNITY): Payer: Self-pay | Admitting: Oncology

## 2014-01-11 ENCOUNTER — Encounter (HOSPITAL_BASED_OUTPATIENT_CLINIC_OR_DEPARTMENT_OTHER): Payer: Medicare Other | Admitting: Oncology

## 2014-01-11 VITALS — BP 133/57 | HR 80 | Temp 98.3°F | Resp 18 | Wt 165.6 lb

## 2014-01-11 DIAGNOSIS — D75838 Other thrombocytosis: Secondary | ICD-10-CM

## 2014-01-11 DIAGNOSIS — D473 Essential (hemorrhagic) thrombocythemia: Secondary | ICD-10-CM

## 2014-01-11 DIAGNOSIS — C8590 Non-Hodgkin lymphoma, unspecified, unspecified site: Secondary | ICD-10-CM

## 2014-01-11 DIAGNOSIS — C8330 Diffuse large B-cell lymphoma, unspecified site: Secondary | ICD-10-CM

## 2014-01-11 DIAGNOSIS — R7989 Other specified abnormal findings of blood chemistry: Secondary | ICD-10-CM

## 2014-01-11 DIAGNOSIS — Z9081 Acquired absence of spleen: Secondary | ICD-10-CM

## 2014-01-11 DIAGNOSIS — D509 Iron deficiency anemia, unspecified: Secondary | ICD-10-CM

## 2014-01-11 NOTE — Patient Instructions (Signed)
[facility] Discharge Instructions  RECOMMENDATIONS MADE BY THE CONSULTANT AND ANY TEST RESULTS WILL BE SENT TO YOUR REFERRING PHYSICIAN.  Labs are good today. We will repeat lab work in 6 months and in 12 months. We will see you back in 12 months for follow-up Follow-up with Dr. Legrand Rams as directed.   Please call us with any fevers, chills, night sweats, unintentional weight loss or new nodules in neck, armpits, or groin.  Thank you for choosing [facility] to provide your oncology and hematology care.  To afford each patient quality time with our providers, please arrive at least 15 minutes before your scheduled appointment time.  With your help, our goal is to use those 15 minutes to complete the necessary work-up to ensure our physicians have the information they need to help with your evaluation and healthcare recommendations.    Effective January 1st, 2014, we ask that you re-schedule your appointment with our physicians should you arrive 10 or more minutes late for your appointment.  We strive to give you quality time with our providers, and arriving late affects you and other patients whose appointments are after yours.    Again, thank you for choosing [facility].  Our hope is that these requests will decrease the amount of time that you wait before being seen by our physicians.       _____________________________________________________________  Should you have questions after your visit to [facility], please contact our office at [phone] between the hours of 8:30 a.m. and 5:00 p.m.  Voicemails left after 4:30 p.m. will not be returned until the following business day.  For prescription refill requests, have your pharmacy contact our office with your prescription refill request.

## 2014-01-14 ENCOUNTER — Ambulatory Visit (HOSPITAL_COMMUNITY)
Admission: RE | Admit: 2014-01-14 | Discharge: 2014-01-14 | Disposition: A | Discharge status: Home / Self Care | Payer: Medicare Other | Source: Ambulatory Visit | Attending: Oncology | Admitting: Oncology

## 2014-01-14 DIAGNOSIS — Z1231 Encounter for screening mammogram for malignant neoplasm of breast: Secondary | ICD-10-CM

## 2014-01-22 ENCOUNTER — Other Ambulatory Visit (HOSPITAL_COMMUNITY): Payer: Self-pay | Admitting: Oncology

## 2014-01-22 DIAGNOSIS — R928 Other abnormal and inconclusive findings on diagnostic imaging of breast: Secondary | ICD-10-CM

## 2014-01-29 ENCOUNTER — Encounter (HOSPITAL_COMMUNITY): Payer: Medicare Other

## 2014-01-29 ENCOUNTER — Ambulatory Visit (HOSPITAL_COMMUNITY)
Admission: RE | Admit: 2014-01-29 | Discharge: 2014-01-29 | Disposition: A | Discharge status: Home / Self Care | Payer: Medicare Other | Source: Ambulatory Visit | Attending: Oncology | Admitting: Oncology

## 2014-01-29 DIAGNOSIS — R928 Other abnormal and inconclusive findings on diagnostic imaging of breast: Secondary | ICD-10-CM

## 2014-07-05 ENCOUNTER — Other Ambulatory Visit (HOSPITAL_COMMUNITY): Payer: Self-pay

## 2014-07-12 ENCOUNTER — Encounter (HOSPITAL_COMMUNITY): Payer: Medicare Other | Attending: Hematology & Oncology

## 2014-07-12 DIAGNOSIS — D509 Iron deficiency anemia, unspecified: Secondary | ICD-10-CM

## 2014-07-12 DIAGNOSIS — Z9081 Acquired absence of spleen: Secondary | ICD-10-CM | POA: Diagnosis not present

## 2014-07-12 DIAGNOSIS — R7989 Other specified abnormal findings of blood chemistry: Secondary | ICD-10-CM | POA: Diagnosis not present

## 2014-07-12 DIAGNOSIS — C8590 Non-Hodgkin lymphoma, unspecified, unspecified site: Secondary | ICD-10-CM | POA: Diagnosis not present

## 2014-07-12 LAB — CBC WITH DIFFERENTIAL/PLATELET
BASOS ABS: 0 10*3/uL (ref 0.0–0.1)
BASOS PCT: 0 % (ref 0–1)
EOS ABS: 0.1 10*3/uL (ref 0.0–0.7)
Eosinophils Relative: 1 % (ref 0–5)
HCT: 40.2 % (ref 36.0–46.0)
Hemoglobin: 13.2 g/dL (ref 12.0–15.0)
Lymphocytes Relative: 44 % (ref 12–46)
Lymphs Abs: 3.8 10*3/uL (ref 0.7–4.0)
MCH: 30.4 pg (ref 26.0–34.0)
MCHC: 32.8 g/dL (ref 30.0–36.0)
MCV: 92.6 fL (ref 78.0–100.0)
Monocytes Absolute: 1.1 10*3/uL — ABNORMAL HIGH (ref 0.1–1.0)
Monocytes Relative: 12 % (ref 3–12)
NEUTROS ABS: 3.8 10*3/uL (ref 1.7–7.7)
NEUTROS PCT: 43 % (ref 43–77)
PLATELETS: 668 10*3/uL — AB (ref 150–400)
RBC: 4.34 MIL/uL (ref 3.87–5.11)
RDW: 15.6 % — AB (ref 11.5–15.5)
WBC: 8.7 10*3/uL (ref 4.0–10.5)

## 2014-07-12 LAB — IRON AND TIBC
IRON: 78 ug/dL (ref 28–170)
Saturation Ratios: 27 % (ref 10.4–31.8)
TIBC: 293 ug/dL (ref 250–450)
UIBC: 215 ug/dL

## 2014-07-12 LAB — COMPREHENSIVE METABOLIC PANEL
ALT: 10 U/L — AB (ref 14–54)
AST: 14 U/L — AB (ref 15–41)
Albumin: 3.7 g/dL (ref 3.5–5.0)
Alkaline Phosphatase: 54 U/L (ref 38–126)
Anion gap: 9 (ref 5–15)
BILIRUBIN TOTAL: 0.6 mg/dL (ref 0.3–1.2)
BUN: 14 mg/dL (ref 6–20)
CO2: 28 mmol/L (ref 22–32)
Calcium: 9.1 mg/dL (ref 8.9–10.3)
Chloride: 105 mmol/L (ref 101–111)
Creatinine, Ser: 0.88 mg/dL (ref 0.44–1.00)
GFR calc Af Amer: >60 mL/min (ref >60)
GFR, EST NON AFRICAN AMERICAN: 60 mL/min — AB (ref >60)
GLUCOSE: 97 mg/dL (ref 65–99)
POTASSIUM: 3 mmol/L — AB (ref 3.5–5.1)
SODIUM: 142 mmol/L (ref 135–145)
Total Protein: 7 g/dL (ref 6.5–8.1)

## 2014-07-12 LAB — C-REACTIVE PROTEIN: CRP: 1.2 mg/dL — AB (ref <1.0)

## 2014-07-12 LAB — SEDIMENTATION RATE: SED RATE: 12 mm/h (ref 0–22)

## 2014-07-12 LAB — FERRITIN: Ferritin: 133 ng/mL (ref 11–307)

## 2014-07-12 LAB — LACTATE DEHYDROGENASE: LDH: 156 U/L (ref 98–192)

## 2014-07-12 NOTE — Progress Notes (Signed)
Labs drawn

## 2014-07-13 LAB — BETA 2 MICROGLOBULIN, SERUM: Beta-2 Microglobulin: 1.8 mg/L (ref 0.6–2.4)

## 2015-01-08 NOTE — Assessment & Plan Note (Signed)
Thrombocytosis, secondary to splenectomy, JAK2 negative.

## 2015-01-08 NOTE — Progress Notes (Signed)
Borden, MD [address] [place] [address]  Diffuse large B-cell lymphoma, unspecified body region [facility]) - Plan: CBC with Differential, Comprehensive metabolic panel, Lactate dehydrogenase, Sedimentation rate, Beta 2 microglobuline, serum, C-reactive protein  Iron deficiency anemia - Plan: CBC with Differential, Iron and TIBC, Ferritin  Postsplenectomy thrombocytosis - Plan: CBC with Differential  Preventative health care - Plan: MM Digital Diagnostic Bilat  CURRENT THERAPY: Surveillance per NCCN guidelines  INTERVAL HISTORY: Leslie Williamson 81 y.o. 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 AND   intermittent iron deficiency for which she received intravenous Feraheme.    NHL (non-Hodgkin's lymphoma) ([place])   07/19/1995 Pathology Results Spleen biopsy with resection- low-grade B-cell lymphoma, splenic marginal zone type   02/20/1998 Imaging CT CAP-marked and extensive cervical adenopathy   02/25/1998 Pathology Results Right cervical lymph node demonstrating large B-cell lymphoma   03/05/1998 Bone Marrow Biopsy No evidence of bone marrow involvement   03/11/1998 - 07/18/1998 Chemotherapy CHOP x 7 cycles   05/09/1998 Imaging CT CAP and neck- slight interval decrease in size of para-aortic adenopathy with interval decrease in right inguinal and bi-iliac adenopathy.  Interval decrease in size and number of enlarged cervical nodes   07/10/1998 Imaging Ct CAP and neck- unremarkable CT of chest. No enlarged retroperitoneal adenopathy withthe previously noted retroperitoneal nodes currently smaller to stable in size.  Cervical adenopathy is stable to slightly smaller in size.   08/18/1998 - 09/08/1998 Chemotherapy Rituxan Day 1, 8, 15, 22 x 1 cycle   01/08/1999 Remission CT CAP and neck  demonstrates no adenopathy or disease   01/08/1999 Imaging CT CAP and neck- Stable CT of chest. Stable CT abd/pelvis without adenopathy.  Negative CT of neck   I personally reviewed and went over laboratory results with the patient.  The results are noted within this dictation.  She denies any B symptoms including fevers, chills, night sweats, unintentional weight loss, and decreased appetite.    She did not have her 6 month follow-up mammogram as directed by radiologist.  She reports that it was too painful.  I have encouraged her to get a mammogram done within the next month or so.  I am not sure she will go.  Hematologically, she denies any complaints and ROS questioning is negative.   Past Medical History  Diagnosis Date  . Iron deficiency anemia   . 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 Hypercholesteremia   . Obesity   . Hypokalemia   . PVD (peripheral vascular disease) ([place])   . Anxiety   . Depression   . NHL (non-Hodgkin's lymphoma) ([place]) 12/17/2010  . Hypertension   . Left hip pain   . Vertigo, labyrinthine   . S/P splenectomy 01/01/2014    has HYPERLIPIDEMIA; Iron deficiency anemia; Postsplenectomy thrombocytosis; ANXIETY; CARPAL TUNNEL SYNDROME; HTN; PVD- know Lt SFA disease, bilat PT and AT disease; ALLERGIC RHINITIS; GERD; SEBORRHEIC KERATOSIS; WEIGHT LOSS, ABNORMAL; COLONIC POLYPS, ADENOMATOUS, HX OF; NHL (non-Hodgkin's lymphoma) ([place]); Claudication of both lower extremities ([place]); and S/P splenectomy on her problem list.     is allergic to penicillins.  [name] does not currently have medications on file.  Past Surgical History  Procedure Laterality Date  . Port-a-cath removal  Denies any headaches, dizziness, double vision, fevers, chills, night sweats, nausea, vomiting, diarrhea, constipation, chest pain, heart palpitations, shortness of breath, blood in stool, black tarry  stool, urinary pain, urinary burning, urinary frequency, hematuria.   PHYSICAL EXAMINATION  ECOG PERFORMANCE STATUS: 0 - Asymptomatic  Filed Vitals:   01/10/15 1042  BP: 122/64  Pulse: 87  Temp: 98.2 F (36.8 C)  Resp: 20    GENERAL:alert, no distress, well nourished, well developed, comfortable, cooperative and smiling SKIN: skin color, texture, turgor are normal, many SKs throughout body HEAD: Normocephalic, No masses, lesions, tenderness or abnormalities EYES: normal, PERRLA, EOMI, Conjunctiva are pink and non-injected EARS: External ears normal OROPHARYNX:mucous membranes are moist  NECK: supple, no adenopathy, thyroid normal size, non-tender, without nodularity, no stridor, non-tender, trachea midline LYMPH:  no palpable lymphadenopathy, no hepatosplenomegaly BREAST:not examined LUNGS: clear to auscultation and percussion HEART: regular rate & rhythm, no murmurs, no gallops, S1 normal and S2 normal ABDOMEN:abdomen soft, non-tender, obese, normal bowel sounds, no masses or organomegaly and no hepatosplenomegaly BACK: Back symmetric, no curvature., No CVA tenderness EXTREMITIES:less then 2 second capillary refill, no joint deformities, effusion, or inflammation, no edema, no skin discoloration, no clubbing, no cyanosis  NEURO: alert & oriented x 3 with fluent speech, no focal motor/sensory deficits, gait normal   LABORATORY DATA: CBC    Component Value Date/Time   WBC 11.1* 01/10/2015 1025   RBC 4.35 01/10/2015 1025   RBC 4.78 06/30/2012 1008   HGB 13.2 01/10/2015 1025   HCT 40.5 01/10/2015 1025   PLT 721* 01/10/2015 1025   MCV 93.1 01/10/2015 1025   MCH 30.3 01/10/2015 1025   MCHC 32.6 01/10/2015 1025   RDW 16.0* 01/10/2015 1025   LYMPHSABS 4.3* 01/10/2015 1025   MONOABS 1.3* 01/10/2015 1025   EOSABS 0.1 01/10/2015 1025   BASOSABS 0.0 01/10/2015 1025      Chemistry      Component Value Date/Time   NA 141 01/10/2015 1025   K 3.5 01/10/2015 1025   CL 108  01/10/2015 1025   CO2 25 01/10/2015 1025   BUN 15 01/10/2015 1025   CREATININE 0.94 01/10/2015 1025      Component Value Date/Time   CALCIUM 9.3 01/10/2015 1025   ALKPHOS 62 01/10/2015 1025   AST 16 01/10/2015 1025   ALT 12* 01/10/2015 1025   BILITOT 0.8 01/10/2015 1025     Lab Results  Component Value Date   IRON 78 07/12/2014   TIBC 293 07/12/2014   FERRITIN 133 07/12/2014   Results for KANDYCE, DIEGUEZ (MRN [id number]) as of 01/10/2015 10:58  Ref. Range 07/12/2014 11:06  LDH Latest Ref Range: 98-192 U/L 156   Results for BRAYLYNN, LEWING (MRN [id number]) as of 01/10/2015 10:58  Ref. Range 07/12/2014 11:06  Beta-2 Microglobulin Latest Ref Range: 0.6-2.4 mg/L 1.8  CRP Latest Ref Range: <1.0 mg/dL 1.2 (H)     ASSESSMENT/PLAN:   NHL (non-Hodgkin's lymphoma) (HCC) Richter's transformation from a low-grade lymphoma to large cell high grade B-cell lymphoma treated with 6 cycles of R-CHOP ending on 09/08/1998 resulting in a complete remission.  Labs today: CBC diff, CMET, LDH, ESR, CRP, B2M  Labs in 6 and 12 months: CBC diff, CMET, LDH, ESR, CRP, B2M  She had a mammogram in Dec 2015 recommending a short-interval follow-up mammogram 6 months later.  This was not done.  Order placed for B/L diagnostic mammogram to be done this month.  Return in 1 year for follow-up.   Iron deficiency anemia  Intermittent iron deficiency for which she received intravenous Feraheme.  Oncology Flowsheet 07/18/2013  ferumoxytol [facility]) IV 1,020 mg   Labs today: iron/TIBC, ferritin  Labs in 6 and 12 months: iron/TIBC, ferritin   Postsplenectomy thrombocytosis Thrombocytosis, secondary to splenectomy, JAK2 negative.    THERAPY PLAN:  NCCN guidelines recommends the follow surveillance for Stage I-IV NHL for those who attain a complete response to therapy:  A. H&P every 3-6 months for 5 years, then yearly or as clinically indicated.  B. Labs every 3-6 months for 5 years and then  annually or as clinically indicated.  C. Repeat CT scans only as clinically indicated.    All questions were answered. The patient knows to call the clinic with any problems, questions or concerns. We can certainly see the patient much sooner if necessary.  Patient and plan discussed with Dr. Farrel Gobble and he is in agreement with the aforementioned.   Demaurion Dicioccio 01/10/2015 11:18 AM

## 2015-01-08 NOTE — Assessment & Plan Note (Signed)
Intermittent iron deficiency for which she received intravenous Feraheme.  Oncology Flowsheet 07/18/2013  ferumoxytol [facility]) IV 1,020 mg   Labs today: iron/TIBC, ferritin  Labs in 6 and 12 months: iron/TIBC, ferritin

## 2015-01-08 NOTE — Assessment & Plan Note (Addendum)
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  Labs today: CBC diff, CMET, LDH, ESR, CRP, B2M  Labs in 6 and 12 months: CBC diff, CMET, LDH, ESR, CRP, B2M  She had a mammogram in Dec 2015 recommending a short-interval follow-up mammogram 6 months later.  This was not done.  Order placed for B/L diagnostic mammogram to be done this month.  Return in 1 year for follow-up.

## 2015-01-10 ENCOUNTER — Encounter (HOSPITAL_COMMUNITY): Payer: Medicare Other | Attending: Oncology

## 2015-01-10 ENCOUNTER — Other Ambulatory Visit (HOSPITAL_COMMUNITY): Payer: Self-pay | Admitting: Oncology

## 2015-01-10 ENCOUNTER — Encounter (HOSPITAL_COMMUNITY): Payer: Self-pay | Admitting: Oncology

## 2015-01-10 ENCOUNTER — Ambulatory Visit (HOSPITAL_COMMUNITY): Payer: Medicare Other | Admitting: Oncology

## 2015-01-10 ENCOUNTER — Encounter (HOSPITAL_BASED_OUTPATIENT_CLINIC_OR_DEPARTMENT_OTHER): Payer: Medicare Other | Admitting: Oncology

## 2015-01-10 VITALS — BP 122/64 | HR 87 | Temp 98.2°F | Resp 20 | Wt 164.0 lb

## 2015-01-10 DIAGNOSIS — D509 Iron deficiency anemia, unspecified: Secondary | ICD-10-CM | POA: Diagnosis not present

## 2015-01-10 DIAGNOSIS — Z9081 Acquired absence of spleen: Secondary | ICD-10-CM

## 2015-01-10 DIAGNOSIS — R7989 Other specified abnormal findings of blood chemistry: Secondary | ICD-10-CM

## 2015-01-10 DIAGNOSIS — D75838 Other thrombocytosis: Secondary | ICD-10-CM

## 2015-01-10 DIAGNOSIS — D473 Essential (hemorrhagic) thrombocythemia: Secondary | ICD-10-CM

## 2015-01-10 DIAGNOSIS — Z0000 Encounter for general adult medical examination without abnormal findings: Secondary | ICD-10-CM

## 2015-01-10 DIAGNOSIS — C8330 Diffuse large B-cell lymphoma, unspecified site: Secondary | ICD-10-CM

## 2015-01-10 DIAGNOSIS — R921 Mammographic calcification found on diagnostic imaging of breast: Secondary | ICD-10-CM

## 2015-01-10 DIAGNOSIS — C8590 Non-Hodgkin lymphoma, unspecified, unspecified site: Secondary | ICD-10-CM | POA: Diagnosis not present

## 2015-01-10 DIAGNOSIS — D75839 Thrombocytosis, unspecified: Secondary | ICD-10-CM

## 2015-01-10 DIAGNOSIS — Z09 Encounter for follow-up examination after completed treatment for conditions other than malignant neoplasm: Secondary | ICD-10-CM

## 2015-01-10 LAB — COMPREHENSIVE METABOLIC PANEL
ALBUMIN: 3.8 g/dL (ref 3.5–5.0)
ALT: 12 U/L — ABNORMAL LOW (ref 14–54)
AST: 16 U/L (ref 15–41)
Alkaline Phosphatase: 62 U/L (ref 38–126)
Anion gap: 8 (ref 5–15)
BUN: 15 mg/dL (ref 6–20)
CHLORIDE: 108 mmol/L (ref 101–111)
CO2: 25 mmol/L (ref 22–32)
Calcium: 9.3 mg/dL (ref 8.9–10.3)
Creatinine, Ser: 0.94 mg/dL (ref 0.44–1.00)
GFR calc Af Amer: >60 mL/min (ref >60)
GFR, EST NON AFRICAN AMERICAN: 55 mL/min — AB (ref >60)
GLUCOSE: 96 mg/dL (ref 65–99)
POTASSIUM: 3.5 mmol/L (ref 3.5–5.1)
Sodium: 141 mmol/L (ref 135–145)
Total Bilirubin: 0.8 mg/dL (ref 0.3–1.2)
Total Protein: 7.3 g/dL (ref 6.5–8.1)

## 2015-01-10 LAB — CBC WITH DIFFERENTIAL/PLATELET
BASOS ABS: 0 10*3/uL (ref 0.0–0.1)
BASOS PCT: 0 %
EOS ABS: 0.1 10*3/uL (ref 0.0–0.7)
EOS PCT: 1 %
HCT: 40.5 % (ref 36.0–46.0)
Hemoglobin: 13.2 g/dL (ref 12.0–15.0)
Lymphocytes Relative: 39 %
Lymphs Abs: 4.3 10*3/uL — ABNORMAL HIGH (ref 0.7–4.0)
MCH: 30.3 pg (ref 26.0–34.0)
MCHC: 32.6 g/dL (ref 30.0–36.0)
MCV: 93.1 fL (ref 78.0–100.0)
MONO ABS: 1.3 10*3/uL — AB (ref 0.1–1.0)
Monocytes Relative: 12 %
Neutro Abs: 5.4 10*3/uL (ref 1.7–7.7)
Neutrophils Relative %: 48 %
PLATELETS: 721 10*3/uL — AB (ref 150–400)
RBC: 4.35 MIL/uL (ref 3.87–5.11)
RDW: 16 % — AB (ref 11.5–15.5)
WBC: 11.1 10*3/uL — ABNORMAL HIGH (ref 4.0–10.5)

## 2015-01-10 LAB — SEDIMENTATION RATE: SED RATE: 23 mm/h — AB (ref 0–22)

## 2015-01-10 LAB — LACTATE DEHYDROGENASE: LDH: 161 U/L (ref 98–192)

## 2015-01-10 LAB — C-REACTIVE PROTEIN: CRP: 0.5 mg/dL (ref <1.0)

## 2015-01-10 LAB — IRON AND TIBC
Iron: 71 ug/dL (ref 28–170)
SATURATION RATIOS: 23 % (ref 10.4–31.8)
TIBC: 305 ug/dL (ref 250–450)
UIBC: 234 ug/dL

## 2015-01-10 LAB — FERRITIN: Ferritin: 90 ng/mL (ref 11–307)

## 2015-01-10 NOTE — Patient Instructions (Signed)
[place] at [facility] Discharge Instructions  RECOMMENDATIONS MADE BY THE CONSULTANT AND ANY TEST RESULTS WILL BE SENT TO YOUR REFERRING PHYSICIAN.  Labs today  Labs in 6 months  (Jul 10, 2015 @ 10:30)  Mammogram January 28, 2015 @ 9:15  Return in 12 months for a follow up visit & labs ( January 08, 2016  @ 9:50 )   Thank you for choosing [place] at [facility] to provide your oncology and hematology care.  To afford each patient quality time with our provider, please arrive at least 15 minutes before your scheduled appointment time.    You need to re-schedule your appointment should you arrive 10 or more minutes late.  We strive to give you quality time with our providers, and arriving late affects you and other patients whose appointments are after yours.  Also, if you no show three or more times for appointments you may be dismissed from the clinic at the providers discretion.     Again, thank you for choosing [facility].  Our hope is that these requests will decrease the amount of time that you wait before being seen by our physicians.       _____________________________________________________________  Should you have questions after your visit to [facility], please contact our office at [phone] between the hours of 8:30 a.m. and 4:30 p.m.  Voicemails left after 4:30 p.m. will not be returned until the following business day.  For prescription refill requests, have your pharmacy contact our office.

## 2015-01-11 LAB — BETA 2 MICROGLOBULIN, SERUM: Beta-2 Microglobulin: 1.9 mg/L (ref 0.6–2.4)

## 2015-01-20 ENCOUNTER — Other Ambulatory Visit (HOSPITAL_COMMUNITY): Payer: Self-pay | Admitting: Oncology

## 2015-01-20 DIAGNOSIS — Z09 Encounter for follow-up examination after completed treatment for conditions other than malignant neoplasm: Secondary | ICD-10-CM

## 2015-01-20 DIAGNOSIS — R921 Mammographic calcification found on diagnostic imaging of breast: Secondary | ICD-10-CM

## 2015-01-22 ENCOUNTER — Encounter (HOSPITAL_BASED_OUTPATIENT_CLINIC_OR_DEPARTMENT_OTHER): Payer: Medicare Other

## 2015-01-22 DIAGNOSIS — D75839 Thrombocytosis, unspecified: Secondary | ICD-10-CM

## 2015-01-22 DIAGNOSIS — D473 Essential (hemorrhagic) thrombocythemia: Secondary | ICD-10-CM | POA: Diagnosis not present

## 2015-01-22 NOTE — Progress Notes (Signed)
Difficult blood draw. 2 sticks to get blood.

## 2015-01-28 ENCOUNTER — Encounter (HOSPITAL_COMMUNITY): Payer: Medicare Other

## 2015-01-28 ENCOUNTER — Ambulatory Visit (HOSPITAL_COMMUNITY): Payer: Medicare Other

## 2015-01-28 LAB — JAK2 V617F, W REFLEX TO CALR/E12/MPL

## 2015-01-29 ENCOUNTER — Other Ambulatory Visit (HOSPITAL_COMMUNITY): Payer: Self-pay | Admitting: Oncology

## 2015-02-10 ENCOUNTER — Encounter (HOSPITAL_COMMUNITY): Payer: Self-pay | Admitting: Oncology

## 2015-02-10 NOTE — Assessment & Plan Note (Addendum)
JAK2 [id number] positive myeloproliferative disorder presenting with thrombocytosis, with preservation of HGB/RBC/HCT and intermittent leukocytosis (lymphocyte predominance).  I personally reviewed and went over laboratory results with the patient.  The results are noted within this dictation.  Labs in 2 weeks: CBC diff, CMET  Labs in 4 weeks: CBC diff, CMET  She is educated on this new diagnosis for her.  She has risk factors that increase her cardiovascular risk and thrombosis risk, therefore, she would benefit from treatment.  Hydrea 500 mg PO M-W-F prescribed given her age and past chemotherapy.  Risks, benefits, alternatives, and side effects discussed.  Continue ASA therapy as prescribed.  Return in 4 weeks for follow-up.

## 2015-02-10 NOTE — Progress Notes (Signed)
[place], MD [address] [place] [address]  MDP (myeloproliferative disorder) [facility]) - Plan: CBC with Differential, Comprehensive metabolic panel, CBC with Differential, Comprehensive metabolic panel, hydroxyurea (HYDREA) 500 MG capsule  CURRENT THERAPY: Newly diagnosed JAK2+ thrombocytosis needing management.  Surveillance per NCCN guidelines for DBCL  INTERVAL HISTORY: Leslie Williamson 81 y.o. 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 AND JAK2+ myeloproliferative disorder presenting with thrombocytosis, thought to be secondary to splenectomy in the past with JAK2 testing in 2014 being negative.  HOWEVER, thrombocytosis progressed and repeat JAK2 is positive for V617F mutation on 01/22/2015. AND   intermittent iron deficiency for which she received intravenous Feraheme.     NHL (non-Hodgkin's lymphoma) ([place])   07/19/1995 Pathology Results Spleen biopsy with resection- low-grade B-cell lymphoma, splenic marginal zone type   02/20/1998 Imaging CT CAP-marked and extensive cervical adenopathy   02/25/1998 Pathology Results Right cervical lymph node demonstrating large B-cell lymphoma   03/05/1998 Bone Marrow Biopsy No evidence of bone marrow involvement   03/11/1998 - 07/18/1998 Chemotherapy CHOP x 7 cycles   05/09/1998 Imaging CT CAP and neck- slight interval decrease in size of para-aortic adenopathy with interval decrease in right inguinal and bi-iliac adenopathy.  Interval decrease in size and number of enlarged cervical nodes   07/10/1998 Imaging Ct CAP and neck- unremarkable CT of chest. No enlarged retroperitoneal adenopathy withthe previously noted retroperitoneal nodes currently smaller to stable in size.  Cervical adenopathy is stable to slightly smaller in size.   08/18/1998 -  09/08/1998 Chemotherapy Rituxan Day 1, 8, 15, 22 x 1 cycle   01/08/1999 Remission CT CAP and neck demonstrates no adenopathy or disease   01/08/1999 Imaging CT CAP and neck- Stable CT of chest. Stable CT abd/pelvis without adenopathy.  Negative CT of neck    Leslie Williamson is being seen for a short-interval appointment due to recent testing for JAK2 which was positive, despite being negative in 2014.  I personally reviewed and went over laboratory results with the patient.  The results are noted within this dictation.  Patient and niece educated on new diagnosis.  I used analogies to help explain the issue.  She understands the mechanism of action regarding this issue.  She is appreciative of the information.  I reviewed treatment options, including Hydrea.  I reviewed the risks, benefits, alternatives, and side effects including pancytopenia and anaphylaxis.  She is agreeable to take the medication.  She denies any complaints today.  Past Medical History  Diagnosis Date  . Iron deficiency anemia   . 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 Hypercholesteremia   . Obesity   . Hypokalemia   . PVD (peripheral vascular disease) ([place])   . Anxiety   . Depression   . NHL (non-Hodgkin's lymphoma) ([place]) 12/17/2010  . Hypertension   . Left hip pain   . Vertigo, labyrinthine   . S/P splenectomy 01/01/2014  . Primary thrombocytosis ([place]) 01/11/2006    Secondary to splenectomy      has HYPERLIPIDEMIA; Iron deficiency anemia; JAK2+ MDP (myeloproliferative disorder) presenting with thrombocytosis ([place]); ANXIETY; CARPAL TUNNEL SYNDROME; HTN; PVD- know Lt SFA disease, bilat PT and AT disease; ALLERGIC RHINITIS; GERD; SEBORRHEIC KERATOSIS; WEIGHT LOSS, ABNORMAL; COLONIC POLYPS, ADENOMATOUS, HX OF;  NHL (non-Hodgkin's lymphoma) ([place]); Claudication of both lower extremities ([place]); and S/P splenectomy on her problem list.     is  allergic to penicillins.  Leslie Williamson does not currently have medications on file.  Past Surgical History  Procedure Laterality Date  . Port-a-cath removal      Denies any headaches, dizziness, double vision, fevers, chills, night sweats, nausea, vomiting, diarrhea, constipation, chest pain, heart palpitations, shortness of breath, blood in stool, black tarry stool, urinary pain, urinary burning, urinary frequency, hematuria.   PHYSICAL EXAMINATION  ECOG PERFORMANCE STATUS: 0 - Asymptomatic  Filed Vitals:   02/11/15 1323  BP: 109/52  Pulse: 97  Temp: 98.2 F (36.8 C)  Resp: 18    GENERAL:alert, no distress, well nourished, well developed, comfortable, cooperative and smiling, accompanied by her niece SKIN: skin color, texture, turgor are normal, many SKs throughout body HEAD: Normocephalic, No masses, lesions, tenderness or abnormalities EYES: EOMI, Conjunctiva are pink and non-injected EARS: External ears normal OROPHARYNX:mucous membranes are moist  NECK: supple, trachea midline LYMPH:  Not examined BREAST:not examined LUNGS: not examined HEART:not examined ABDOMEN: not examined BACK: not examined EXTREMITIES: not examined NEURO: alert & oriented x 3 with fluent speech, no focal motor/sensory deficits, gait normal   LABORATORY DATA: CBC    Component Value Date/Time   WBC 11.1* 01/10/2015 1025   RBC 4.35 01/10/2015 1025   RBC 4.78 06/30/2012 1008   HGB 13.2 01/10/2015 1025   HCT 40.5 01/10/2015 1025   PLT 721* 01/10/2015 1025   MCV 93.1 01/10/2015 1025   MCH 30.3 01/10/2015 1025   MCHC 32.6 01/10/2015 1025   RDW 16.0* 01/10/2015 1025   LYMPHSABS 4.3* 01/10/2015 1025   MONOABS 1.3* 01/10/2015 1025   EOSABS 0.1 01/10/2015 1025   BASOSABS 0.0 01/10/2015 1025      Chemistry      Component Value Date/Time   NA 141 01/10/2015 1025   K 3.5 01/10/2015 1025   CL 108 01/10/2015 1025   CO2 25 01/10/2015 1025   BUN 15 01/10/2015 1025   CREATININE 0.94  01/10/2015 1025      Component Value Date/Time   CALCIUM 9.3 01/10/2015 1025   ALKPHOS 62 01/10/2015 1025   AST 16 01/10/2015 1025   ALT 12* 01/10/2015 1025   BILITOT 0.8 01/10/2015 1025     Lab Results  Component Value Date   IRON 71 01/10/2015   TIBC 305 01/10/2015   FERRITIN 90 01/10/2015   JAK2 GenotypR Comment (A)   Comments: (NOTE)  Result: POSITIVE for the detection of the V617F mutation.             ASSESSMENT/PLAN:   JAK2+ MDP (myeloproliferative disorder) presenting with thrombocytosis (HCC) JAK2 V617F positive myeloproliferative disorder presenting with thrombocytosis, with preservation of HGB/RBC/HCT and intermittent leukocytosis (lymphocyte predominance).  I personally reviewed and went over laboratory results with the patient.  The results are noted within this dictation.  Labs in 2 weeks: CBC diff, CMET  Labs in 4 weeks: CBC diff, CMET  She is educated on this new diagnosis for her.  She has risk factors that increase her cardiovascular risk and thrombosis risk, therefore, she would benefit from treatment.  Hydrea 500 mg PO M-W-F prescribed given her age and past chemotherapy.  Risks, benefits, alternatives, and side effects discussed.  Continue ASA therapy as prescribed.  Return in 4 weeks for follow-up.    THERAPY PLAN:  Will start Hydrea 500 mg M-W-F as outlined above.  We will keep a  close eye on her lab results and monitor for response.  Will adjust dose according to lab results.  She is to continue with surveillance for NHL as well. NCCN guidelines recommends the follow surveillance for Stage I-IV NHL for those who attain a complete response to therapy:  A. H&P every 3-6 months for 5 years, then yearly or as clinically indicated.  B. Labs every 3-6 months for 5 years and then annually or as clinically indicated.  C. Repeat CT scans only as clinically indicated.    All questions were answered. The patient knows to call the clinic with any  problems, questions or concerns. We can certainly see the patient much sooner if necessary.  Patient and plan discussed with Dr. Ancil Linsey and she is in agreement with the aforementioned.   Leslie Williamson 02/11/2015 5:03 PM

## 2015-02-11 ENCOUNTER — Encounter (HOSPITAL_COMMUNITY): Payer: Medicare Other | Attending: Oncology | Admitting: Oncology

## 2015-02-11 ENCOUNTER — Encounter (HOSPITAL_COMMUNITY): Payer: Self-pay | Admitting: Oncology

## 2015-02-11 VITALS — BP 109/52 | HR 97 | Temp 98.2°F | Resp 18 | Wt 165.4 lb

## 2015-02-11 DIAGNOSIS — D47Z9 Other specified neoplasms of uncertain behavior of lymphoid, hematopoietic and related tissue: Secondary | ICD-10-CM

## 2015-02-11 DIAGNOSIS — Z8572 Personal history of non-Hodgkin lymphomas: Secondary | ICD-10-CM | POA: Diagnosis not present

## 2015-02-11 DIAGNOSIS — D471 Chronic myeloproliferative disease: Secondary | ICD-10-CM

## 2015-02-11 DIAGNOSIS — E611 Iron deficiency: Secondary | ICD-10-CM | POA: Diagnosis not present

## 2015-02-11 DIAGNOSIS — C8590 Non-Hodgkin lymphoma, unspecified, unspecified site: Secondary | ICD-10-CM | POA: Insufficient documentation

## 2015-02-11 MED ORDER — HYDROXYUREA 500 MG PO CAPS: 500.0000 mg | ORAL_CAPSULE | ORAL | Status: DC

## 2015-02-11 NOTE — Patient Instructions (Signed)
[place] at [facility] Discharge Instructions  RECOMMENDATIONS MADE BY THE CONSULTANT AND ANY TEST RESULTS WILL BE SENT TO YOUR REFERRING PHYSICIAN.  Exam and discussion by Robynn Pane, PA-C You have Jak 2 positive myeloproliferative disorder with thrombocytosis (elevated platelets) Hydrea 500 mg - take on Mondays, Wednesdays and Fridays only. Call with any concerns or questions. Labs every 2 weeks Office visit in 4 weeks.  Thank you for choosing [place] at [facility] to provide your oncology and hematology care.  To afford each patient quality time with our provider, please arrive at least 15 minutes before your scheduled appointment time.    You need to re-schedule your appointment should you arrive 10 or more minutes late.  We strive to give you quality time with our providers, and arriving late affects you and other patients whose appointments are after yours.  Also, if you no show three or more times for appointments you may be dismissed from the clinic at the providers discretion.     Again, thank you for choosing [facility].  Our hope is that these requests will decrease the amount of time that you wait before being seen by our physicians.       _____________________________________________________________  Should you have questions after your visit to [facility], please contact our office at [phone] between the hours of 8:30 a.m. and 4:30 p.m.  Voicemails left after 4:30 p.m. will not be returned until the following business day.  For prescription refill requests, have your pharmacy contact our office.

## 2015-02-25 ENCOUNTER — Encounter (HOSPITAL_COMMUNITY): Payer: Medicare Other | Attending: Oncology

## 2015-02-25 DIAGNOSIS — D471 Chronic myeloproliferative disease: Secondary | ICD-10-CM

## 2015-02-25 DIAGNOSIS — C8590 Non-Hodgkin lymphoma, unspecified, unspecified site: Secondary | ICD-10-CM | POA: Insufficient documentation

## 2015-02-25 LAB — COMPREHENSIVE METABOLIC PANEL
ALT: 11 U/L — AB (ref 14–54)
AST: 15 U/L (ref 15–41)
Albumin: 3.6 g/dL (ref 3.5–5.0)
Alkaline Phosphatase: 62 U/L (ref 38–126)
Anion gap: 9 (ref 5–15)
BUN: 20 mg/dL (ref 6–20)
CALCIUM: 9.2 mg/dL (ref 8.9–10.3)
CHLORIDE: 107 mmol/L (ref 101–111)
CO2: 25 mmol/L (ref 22–32)
CREATININE: 1 mg/dL (ref 0.44–1.00)
GFR calc non Af Amer: 51 mL/min — ABNORMAL LOW (ref >60)
GFR, EST AFRICAN AMERICAN: 60 mL/min — AB (ref >60)
GLUCOSE: 102 mg/dL — AB (ref 65–99)
POTASSIUM: 4.1 mmol/L (ref 3.5–5.1)
SODIUM: 141 mmol/L (ref 135–145)
Total Bilirubin: 0.4 mg/dL (ref 0.3–1.2)
Total Protein: 6.7 g/dL (ref 6.5–8.1)

## 2015-02-25 LAB — CBC WITH DIFFERENTIAL/PLATELET
BASOS ABS: 0 10*3/uL (ref 0.0–0.1)
Basophils Relative: 0 %
EOS ABS: 0.1 10*3/uL (ref 0.0–0.7)
EOS PCT: 1 %
HCT: 35.7 % — ABNORMAL LOW (ref 36.0–46.0)
Hemoglobin: 11.6 g/dL — ABNORMAL LOW (ref 12.0–15.0)
Lymphocytes Relative: 36 %
Lymphs Abs: 4.1 10*3/uL — ABNORMAL HIGH (ref 0.7–4.0)
MCH: 31.3 pg (ref 26.0–34.0)
MCHC: 32.5 g/dL (ref 30.0–36.0)
MCV: 96.2 fL (ref 78.0–100.0)
MONO ABS: 1.1 10*3/uL — AB (ref 0.1–1.0)
Monocytes Relative: 10 %
Neutro Abs: 6 10*3/uL (ref 1.7–7.7)
Neutrophils Relative %: 53 %
PLATELETS: 782 10*3/uL — AB (ref 150–400)
RBC: 3.71 MIL/uL — AB (ref 3.87–5.11)
RDW: 16.6 % — AB (ref 11.5–15.5)
WBC: 11.4 10*3/uL — AB (ref 4.0–10.5)

## 2015-02-25 NOTE — Progress Notes (Signed)
LABS DRAWN

## 2015-02-26 ENCOUNTER — Telehealth (HOSPITAL_COMMUNITY): Payer: Self-pay | Admitting: Emergency Medicine

## 2015-02-26 NOTE — Telephone Encounter (Signed)
-----   Message from Baird Cancer, PA-C sent at 02/25/2015  2:54 PM EST ----- Increase Hydrea to 500 mg M-F.  Labs in 2 weeks

## 2015-02-26 NOTE — Telephone Encounter (Signed)
Spoke with pt increase hydrea to 500 mg m-f and repeat labs in 2 weeks.  Pt verbalized understanding

## 2015-03-06 ENCOUNTER — Other Ambulatory Visit (HOSPITAL_COMMUNITY): Payer: Self-pay | Admitting: Oncology

## 2015-03-06 DIAGNOSIS — D471 Chronic myeloproliferative disease: Secondary | ICD-10-CM

## 2015-03-06 MED ORDER — HYDROXYUREA 500 MG PO CAPS: ORAL_CAPSULE | ORAL | Status: DC

## 2015-03-09 NOTE — Assessment & Plan Note (Addendum)
JAK2 V617F positive myeloproliferative disorder presenting with thrombocytosis, with preservation of HGB/RBC/HCT and intermittent leukocytosis (lymphocyte predominance).  On Hydrea 500 mg M-W-F beginning on 01/10/2015.  She was advised to increase her Hydrea dose to 500 mg M-F on 02/25/2015 according to communication with the nurse and myself.  Unfortunately, there was a miscommunication with the patient as she notes that her Hydrea dose has remained 500 mg M-W-F.  I personally reviewed and went over laboratory results with the patient.  The results are noted within this dictation.  Her platelet count has declined minimally.  Her HGB is stable.  Her WBC is WNL.    Labs today: CBC diff, CMET  Labs in 2 weeks: CBC  Labs in 4 weeks: CBC diff, CMET  Labs in 6 weeks: CBC  If her platelet count is not down in 2 weeks, I will increase her dose to Hydrea Monday-Friday.  She is advised that this will be her dose if I change it in 2 weeks.  Continue ASA therapy as prescribed.  Return in 6 weeks for follow-up.

## 2015-03-09 NOTE — Progress Notes (Signed)
[place], MD [address] [place] [address]  JAK2+ MDP (myeloproliferative disorder) presenting with thrombocytosis ([place]) - Plan: CBC, CBC with Differential, Comprehensive metabolic panel, CBC  Diffuse large B-cell lymphoma, unspecified body region [facility])  Iron deficiency anemia  CURRENT THERAPY: Hydrea 500 mg PO M-W-F.  Surveillance per NCCN guidelines for DBCL  INTERVAL HISTORY: Leslie Williamson 82 y.o. female returns for  regular  visit for followup of JAK2+ myeloproliferative disorder presenting with thrombocytosis, thought to be secondary to splenectomy in the past with JAK2 testing in 2014 being negative.  HOWEVER, thrombocytosis progressed and repeat JAK2 is positive for V617F mutation on 01/22/2015. 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 AND   intermittent iron deficiency for which she received intravenous Feraheme.     NHL (non-Hodgkin's lymphoma) ([place])   07/19/1995 Pathology Results Spleen biopsy with resection- low-grade B-cell lymphoma, splenic marginal zone type   02/20/1998 Imaging CT CAP-marked and extensive cervical adenopathy   02/25/1998 Pathology Results Right cervical lymph node demonstrating large B-cell lymphoma   03/05/1998 Bone Marrow Biopsy No evidence of bone marrow involvement   03/11/1998 - 07/18/1998 Chemotherapy CHOP x 7 cycles   05/09/1998 Imaging CT CAP and neck- slight interval decrease in size of para-aortic adenopathy with interval decrease in right inguinal and bi-iliac adenopathy.  Interval decrease in size and number of enlarged cervical nodes   07/10/1998 Imaging Ct CAP and neck- unremarkable CT of chest. No enlarged retroperitoneal adenopathy withthe previously noted retroperitoneal nodes currently smaller to stable in size.  Cervical adenopathy is stable to slightly smaller in size.   08/18/1998 - 09/08/1998 Chemotherapy Rituxan Day 1, 8, 15, 22 x 1 cycle   01/08/1999 Remission CT CAP and neck demonstrates no adenopathy or disease   01/08/1999 Imaging CT CAP and neck- Stable CT of chest. Stable CT abd/pelvis without adenopathy.  Negative CT of neck    I personally reviewed and went over laboratory results with the patient.  The results are noted within this dictation.  Her platelet has come down slightly.    On 02/25/15, nursing advised the patient to increase her Hydrea dose to 500 mg M-F.  She admits today that she has continued M-W-F dosing of Hydrea.  I will keep her at this dose for another 2 weeks and I do not witness a significant change in her platelet count, her dose will be increased to M-F (500 mg).  She is tolerating Hydrea well.  She denies any side effects of the medication.  Past Medical History  Diagnosis Date  . Iron deficiency anemia   . 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 Hypercholesteremia   . Obesity   . Hypokalemia   . PVD (peripheral vascular disease) ([place])   . Anxiety   . Depression   . NHL (non-Hodgkin's lymphoma) ([place]) 12/17/2010  . Hypertension   . Left hip pain   . Vertigo, labyrinthine   . S/P splenectomy 01/01/2014  . Primary thrombocytosis ([place]) 01/11/2006    Secondary to splenectomy      has HYPERLIPIDEMIA; Iron deficiency anemia; JAK2+ MDP (myeloproliferative disorder) presenting with thrombocytosis ([place]); ANXIETY; CARPAL TUNNEL SYNDROME; HTN; PVD- know Lt SFA disease, bilat PT and AT disease; ALLERGIC RHINITIS; GERD; SEBORRHEIC KERATOSIS; WEIGHT LOSS, ABNORMAL; COLONIC POLYPS, ADENOMATOUS, HX OF; NHL (  non-Hodgkin's lymphoma) ([place]); Claudication of both lower extremities ([place]); and S/P splenectomy on her problem list.     is allergic to penicillins.  Leslie Williamson does not currently have medications on file.  Past Surgical History  Procedure Laterality  Date  . Port-a-cath removal      Denies any headaches, dizziness, double vision, fevers, chills, night sweats, nausea, vomiting, diarrhea, constipation, chest pain, heart palpitations, shortness of breath, blood in stool, black tarry stool, urinary pain, urinary burning, urinary frequency, hematuria.   PHYSICAL EXAMINATION  ECOG PERFORMANCE STATUS: 0 - Asymptomatic  Filed Vitals:   03/11/15 1343  BP: 123/58  Pulse: 76  Temp: 97.7 F (36.5 C)  Resp: 20    GENERAL:alert, no distress, well nourished, well developed, comfortable, cooperative and smiling, accompanied by her niece SKIN: skin color, texture, turgor are normal, many SKs throughout body HEAD: Normocephalic, No masses, lesions, tenderness or abnormalities EYES: EOMI, Conjunctiva are pink and non-injected EARS: External ears normal OROPHARYNX:mucous membranes are moist  NECK: supple, trachea midline LYMPH:  Not examined BREAST:not examined LUNGS: not examined HEART:not examined ABDOMEN: not examined BACK: not examined EXTREMITIES: not examined NEURO: alert & oriented x 3 with fluent speech, no focal motor/sensory deficits, gait normal   LABORATORY DATA: CBC    Component Value Date/Time   WBC 9.2 03/11/2015 1319   RBC 3.75* 03/11/2015 1319   RBC 4.78 06/30/2012 1008   HGB 11.6* 03/11/2015 1319   HCT 36.1 03/11/2015 1319   PLT 758* 03/11/2015 1319   MCV 96.3 03/11/2015 1319   MCH 30.9 03/11/2015 1319   MCHC 32.1 03/11/2015 1319   RDW 16.9* 03/11/2015 1319   LYMPHSABS 3.3 03/11/2015 1319   MONOABS 0.9 03/11/2015 1319   EOSABS 0.1 03/11/2015 1319   BASOSABS 0.0 03/11/2015 1319      Chemistry      Component Value Date/Time   NA 142 03/11/2015 1319   K 3.7 03/11/2015 1319   CL 108 03/11/2015 1319   CO2 24 03/11/2015 1319   BUN 17 03/11/2015 1319   CREATININE 0.97 03/11/2015 1319      Component Value Date/Time   CALCIUM 9.2 03/11/2015 1319   ALKPHOS 64 03/11/2015 1319   AST 19 03/11/2015 1319   ALT  10* 03/11/2015 1319   BILITOT 0.8 03/11/2015 1319     Lab Results  Component Value Date   IRON 71 01/10/2015   TIBC 305 01/10/2015   FERRITIN 90 01/10/2015   JAK2 GenotypR Comment (A)   Comments: (NOTE)  Result: POSITIVE for the detection of the V617F mutation.             ASSESSMENT/PLAN:   JAK2+ MDP (myeloproliferative disorder) presenting with thrombocytosis (HCC) JAK2 V617F positive myeloproliferative disorder presenting with thrombocytosis, with preservation of HGB/RBC/HCT and intermittent leukocytosis (lymphocyte predominance).  On Hydrea 500 mg M-W-F beginning on 01/10/2015.  She was advised to increase her Hydrea dose to 500 mg M-F on 02/25/2015 according to communication with the nurse and myself.  Unfortunately, there was a miscommunication with the patient as she notes that her Hydrea dose has remained 500 mg M-W-F.  I personally reviewed and went over laboratory results with the patient.  The results are noted within this dictation.  Her platelet count has declined minimally.  Her HGB is stable.  Her WBC is WNL.    Labs today: CBC diff, CMET  Labs in 2 weeks: CBC  Labs in 4 weeks: CBC diff, CMET  Labs in 6 weeks: CBC  If  her platelet count is not down in 2 weeks, I will increase her dose to Hydrea Monday-Friday.  She is advised that this will be her dose if I change it in 2 weeks.  Continue ASA therapy as prescribed.  Return in 6 weeks for follow-up.  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  Labs in May 2017: CBC diff, CMET, LDH, ESR, CRP, B2M  Labs in November 2017: CBC diff, CMET, LDH, ESR, CRP, B2M  She had a mammogram in Dec 2015 recommending a short-interval follow-up mammogram 6 months later.  This was not done.  Order placed for B/L diagnostic mammogram to be done this  month.  Return in 1 year for follow-up for NHL.  Iron deficiency anemia Intermittent iron deficiency for which she received intravenous Feraheme.  Oncology Flowsheet 07/18/2013  ferumoxytol [facility]) IV 1,020 mg   Labs in May 2017 and November 2017: iron/TIBC, ferritin       THERAPY PLAN:  Continue Hydrea.  We will keep a close eye on her lab results and monitor for response.  Will adjust dose according to lab results.  She is to continue with surveillance for NHL as well. NCCN guidelines recommends the follow surveillance for Stage I-IV NHL for those who attain a complete response to therapy:  A. H&P every 3-6 months for 5 years, then yearly or as clinically indicated.  B. Labs every 3-6 months for 5 years and then annually or as clinically indicated.  C. Repeat CT scans only as clinically indicated.    All questions were answered. The patient knows to call the clinic with any problems, questions or concerns. We can certainly see the patient much sooner if necessary.  Patient and plan discussed with Dr. Ancil Linsey and she is in agreement with the aforementioned.   Drema Eddington 03/11/2015 4:14 PM

## 2015-03-09 NOTE — Assessment & Plan Note (Signed)
Intermittent iron deficiency for which she received intravenous Feraheme.  Oncology Flowsheet 07/18/2013  ferumoxytol [facility]) IV 1,020 mg   Labs in May 2017 and November 2017: iron/TIBC, ferritin

## 2015-03-09 NOTE — Assessment & Plan Note (Signed)
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  Labs in May 2017: CBC diff, CMET, LDH, ESR, CRP, B2M  Labs in November 2017: CBC diff, CMET, LDH, ESR, CRP, B2M  She had a mammogram in Dec 2015 recommending a short-interval follow-up mammogram 6 months later.  This was not done.  Order placed for B/L diagnostic mammogram to be done this month.  Return in 1 year for follow-up for NHL.

## 2015-03-11 ENCOUNTER — Other Ambulatory Visit (HOSPITAL_COMMUNITY): Payer: Self-pay | Admitting: Oncology

## 2015-03-11 ENCOUNTER — Encounter (HOSPITAL_COMMUNITY): Payer: Medicare Other

## 2015-03-11 ENCOUNTER — Encounter (HOSPITAL_BASED_OUTPATIENT_CLINIC_OR_DEPARTMENT_OTHER): Payer: Medicare Other | Admitting: Oncology

## 2015-03-11 ENCOUNTER — Encounter (HOSPITAL_COMMUNITY): Payer: Self-pay | Admitting: Oncology

## 2015-03-11 VITALS — BP 123/58 | HR 76 | Temp 97.7°F | Resp 20 | Wt 163.1 lb

## 2015-03-11 DIAGNOSIS — D471 Chronic myeloproliferative disease: Secondary | ICD-10-CM

## 2015-03-11 DIAGNOSIS — D509 Iron deficiency anemia, unspecified: Secondary | ICD-10-CM

## 2015-03-11 DIAGNOSIS — Z8572 Personal history of non-Hodgkin lymphomas: Secondary | ICD-10-CM

## 2015-03-11 DIAGNOSIS — C8330 Diffuse large B-cell lymphoma, unspecified site: Secondary | ICD-10-CM

## 2015-03-11 DIAGNOSIS — D47Z9 Other specified neoplasms of uncertain behavior of lymphoid, hematopoietic and related tissue: Secondary | ICD-10-CM

## 2015-03-11 DIAGNOSIS — Z09 Encounter for follow-up examination after completed treatment for conditions other than malignant neoplasm: Secondary | ICD-10-CM

## 2015-03-11 DIAGNOSIS — R921 Mammographic calcification found on diagnostic imaging of breast: Secondary | ICD-10-CM

## 2015-03-11 DIAGNOSIS — C8590 Non-Hodgkin lymphoma, unspecified, unspecified site: Secondary | ICD-10-CM | POA: Diagnosis not present

## 2015-03-11 LAB — COMPREHENSIVE METABOLIC PANEL
ALBUMIN: 3.8 g/dL (ref 3.5–5.0)
ALK PHOS: 64 U/L (ref 38–126)
ALT: 10 U/L — ABNORMAL LOW (ref 14–54)
AST: 19 U/L (ref 15–41)
Anion gap: 10 (ref 5–15)
BILIRUBIN TOTAL: 0.8 mg/dL (ref 0.3–1.2)
BUN: 17 mg/dL (ref 6–20)
CALCIUM: 9.2 mg/dL (ref 8.9–10.3)
CO2: 24 mmol/L (ref 22–32)
Chloride: 108 mmol/L (ref 101–111)
Creatinine, Ser: 0.97 mg/dL (ref 0.44–1.00)
GFR calc Af Amer: >60 mL/min (ref >60)
GFR calc non Af Amer: 53 mL/min — ABNORMAL LOW (ref >60)
GLUCOSE: 97 mg/dL (ref 65–99)
Potassium: 3.7 mmol/L (ref 3.5–5.1)
Sodium: 142 mmol/L (ref 135–145)
TOTAL PROTEIN: 7 g/dL (ref 6.5–8.1)

## 2015-03-11 LAB — CBC WITH DIFFERENTIAL/PLATELET
BASOS ABS: 0 10*3/uL (ref 0.0–0.1)
BASOS PCT: 0 %
Eosinophils Absolute: 0.1 10*3/uL (ref 0.0–0.7)
Eosinophils Relative: 1 %
HEMATOCRIT: 36.1 % (ref 36.0–46.0)
HEMOGLOBIN: 11.6 g/dL — AB (ref 12.0–15.0)
Lymphocytes Relative: 36 %
Lymphs Abs: 3.3 10*3/uL (ref 0.7–4.0)
MCH: 30.9 pg (ref 26.0–34.0)
MCHC: 32.1 g/dL (ref 30.0–36.0)
MCV: 96.3 fL (ref 78.0–100.0)
Monocytes Absolute: 0.9 10*3/uL (ref 0.1–1.0)
Monocytes Relative: 10 %
NEUTROS ABS: 4.9 10*3/uL (ref 1.7–7.7)
NEUTROS PCT: 53 %
Platelets: 758 10*3/uL — ABNORMAL HIGH (ref 150–400)
RBC: 3.75 MIL/uL — AB (ref 3.87–5.11)
RDW: 16.9 % — ABNORMAL HIGH (ref 11.5–15.5)
WBC: 9.2 10*3/uL (ref 4.0–10.5)

## 2015-03-11 NOTE — Patient Instructions (Addendum)
[place] at [facility] Discharge Instructions  RECOMMENDATIONS MADE BY THE CONSULTANT AND ANY TEST RESULTS WILL BE SENT TO YOUR REFERRING PHYSICIAN.  Exam done and seen by Kirby Crigler PA-C Continue Hydrea 500mg  on Monday, Wednesday and Friday Labs every 2 weeks Return in 6 weeks for follow up Need mammogram this month Continue daily Aspirin for now  Thank you for choosing [place] at [facility] to provide your oncology and hematology care.  To afford each patient quality time with our provider, please arrive at least 15 minutes before your scheduled appointment time.    You need to re-schedule your appointment should you arrive 10 or more minutes late.  We strive to give you quality time with our providers, and arriving late affects you and other patients whose appointments are after yours.  Also, if you no show three or more times for appointments you may be dismissed from the clinic at the providers discretion.     Again, thank you for choosing [facility].  Our hope is that these requests will decrease the amount of time that you wait before being seen by our physicians.       _____________________________________________________________  Should you have questions after your visit to [facility], please contact our office at [phone] between the hours of 8:30 a.m. and 4:30 p.m.  Voicemails left after 4:30 p.m. will not be returned until the following business day.  For prescription refill requests, have your pharmacy contact our office.

## 2015-03-25 ENCOUNTER — Encounter (HOSPITAL_COMMUNITY): Payer: Medicare Other

## 2015-03-25 DIAGNOSIS — C8590 Non-Hodgkin lymphoma, unspecified, unspecified site: Secondary | ICD-10-CM | POA: Diagnosis not present

## 2015-03-25 DIAGNOSIS — D471 Chronic myeloproliferative disease: Secondary | ICD-10-CM

## 2015-03-25 LAB — CBC
HEMATOCRIT: 37.7 % (ref 36.0–46.0)
HEMOGLOBIN: 12.2 g/dL (ref 12.0–15.0)
MCH: 30.9 pg (ref 26.0–34.0)
MCHC: 32.4 g/dL (ref 30.0–36.0)
MCV: 95.4 fL (ref 78.0–100.0)
Platelets: 659 10*3/uL — ABNORMAL HIGH (ref 150–400)
RBC: 3.95 MIL/uL (ref 3.87–5.11)
RDW: 16.5 % — ABNORMAL HIGH (ref 11.5–15.5)
WBC: 11.6 10*3/uL — AB (ref 4.0–10.5)

## 2015-04-07 ENCOUNTER — Telehealth (HOSPITAL_COMMUNITY): Payer: Self-pay | Admitting: *Deleted

## 2015-04-07 DIAGNOSIS — J069 Acute upper respiratory infection, unspecified: Secondary | ICD-10-CM | POA: Diagnosis not present

## 2015-04-07 DIAGNOSIS — J40 Bronchitis, not specified as acute or chronic: Secondary | ICD-10-CM | POA: Diagnosis not present

## 2015-04-07 NOTE — Telephone Encounter (Signed)
Nursing please triage.  Any fevers?  Productive or non-productive cough?  If productive, what color?  If non-productive, any new medications?  What has she tried OTC.  If nothing, she can try Delsym.  Please update me accordingly.  Thank you.  Robynn Pane, PA-C 04/07/2015 3:41 PM

## 2015-04-08 ENCOUNTER — Encounter (HOSPITAL_COMMUNITY): Payer: Medicare Other | Attending: Hematology & Oncology

## 2015-04-08 DIAGNOSIS — D471 Chronic myeloproliferative disease: Secondary | ICD-10-CM | POA: Insufficient documentation

## 2015-04-08 LAB — CBC WITH DIFFERENTIAL/PLATELET
BASOS ABS: 0 10*3/uL (ref 0.0–0.1)
Basophils Relative: 0 %
Eosinophils Absolute: 0.1 10*3/uL (ref 0.0–0.7)
Eosinophils Relative: 1 %
HEMATOCRIT: 40.5 % (ref 36.0–46.0)
Hemoglobin: 13.1 g/dL (ref 12.0–15.0)
LYMPHS ABS: 3.5 10*3/uL (ref 0.7–4.0)
LYMPHS PCT: 34 %
MCH: 31.3 pg (ref 26.0–34.0)
MCHC: 32.3 g/dL (ref 30.0–36.0)
MCV: 96.7 fL (ref 78.0–100.0)
Monocytes Absolute: 1.3 10*3/uL — ABNORMAL HIGH (ref 0.1–1.0)
Monocytes Relative: 13 %
NEUTROS PCT: 52 %
Neutro Abs: 5.2 10*3/uL (ref 1.7–7.7)
PLATELETS: 636 10*3/uL — AB (ref 150–400)
RBC: 4.19 MIL/uL (ref 3.87–5.11)
RDW: 16.4 % — AB (ref 11.5–15.5)
WBC: 10.1 10*3/uL (ref 4.0–10.5)

## 2015-04-08 LAB — COMPREHENSIVE METABOLIC PANEL
ALT: 12 U/L — ABNORMAL LOW (ref 14–54)
AST: 18 U/L (ref 15–41)
Albumin: 3.9 g/dL (ref 3.5–5.0)
Alkaline Phosphatase: 60 U/L (ref 38–126)
Anion gap: 10 (ref 5–15)
BILIRUBIN TOTAL: 0.4 mg/dL (ref 0.3–1.2)
BUN: 18 mg/dL (ref 6–20)
CO2: 23 mmol/L (ref 22–32)
CREATININE: 1.01 mg/dL — AB (ref 0.44–1.00)
Calcium: 9 mg/dL (ref 8.9–10.3)
Chloride: 108 mmol/L (ref 101–111)
GFR, EST AFRICAN AMERICAN: 59 mL/min — AB (ref >60)
GFR, EST NON AFRICAN AMERICAN: 51 mL/min — AB (ref >60)
Glucose, Bld: 150 mg/dL — ABNORMAL HIGH (ref 65–99)
POTASSIUM: 4 mmol/L (ref 3.5–5.1)
Sodium: 141 mmol/L (ref 135–145)
TOTAL PROTEIN: 7.5 g/dL (ref 6.5–8.1)

## 2015-04-08 NOTE — Telephone Encounter (Signed)
Message left for patient to call us back.

## 2015-04-09 NOTE — Telephone Encounter (Signed)
I talked with son on 2/14. Leslie Williamson was not there. I called her back today and she said she had went to urgent care. She has medication from them but is still coughing a lot at night. She called the MD at urgent care and they are going to see her again tomorrow and get xray. I advised her to try the Delsym tonight to see if it would help her rest better.

## 2015-04-10 ENCOUNTER — Ambulatory Visit (HOSPITAL_COMMUNITY)
Admission: RE | Admit: 2015-04-10 | Discharge: 2015-04-10 | Disposition: A | Discharge status: Home / Self Care | Payer: Medicare Other | Source: Ambulatory Visit | Attending: Preventative Medicine | Admitting: Preventative Medicine

## 2015-04-10 ENCOUNTER — Other Ambulatory Visit (HOSPITAL_COMMUNITY): Payer: Self-pay | Admitting: Preventative Medicine

## 2015-04-10 DIAGNOSIS — R6883 Chills (without fever): Secondary | ICD-10-CM | POA: Insufficient documentation

## 2015-04-10 DIAGNOSIS — R05 Cough: Secondary | ICD-10-CM | POA: Diagnosis not present

## 2015-04-10 DIAGNOSIS — R059 Cough, unspecified: Secondary | ICD-10-CM

## 2015-04-10 DIAGNOSIS — R0602 Shortness of breath: Secondary | ICD-10-CM | POA: Diagnosis not present

## 2015-04-11 DIAGNOSIS — J84114 Acute interstitial pneumonitis: Secondary | ICD-10-CM | POA: Diagnosis not present

## 2015-04-24 ENCOUNTER — Ambulatory Visit (HOSPITAL_COMMUNITY): Payer: Medicare Other

## 2015-04-24 ENCOUNTER — Other Ambulatory Visit (HOSPITAL_COMMUNITY): Payer: Self-pay | Admitting: Internal Medicine

## 2015-04-24 ENCOUNTER — Ambulatory Visit (HOSPITAL_COMMUNITY)
Admission: RE | Admit: 2015-04-24 | Discharge: 2015-04-24 | Disposition: A | Discharge status: Home / Self Care | Payer: Medicare Other | Source: Ambulatory Visit | Attending: Internal Medicine | Admitting: Internal Medicine

## 2015-04-24 ENCOUNTER — Encounter (HOSPITAL_COMMUNITY): Payer: Medicare Other | Attending: Oncology

## 2015-04-24 DIAGNOSIS — C8590 Non-Hodgkin lymphoma, unspecified, unspecified site: Secondary | ICD-10-CM | POA: Diagnosis not present

## 2015-04-24 DIAGNOSIS — D471 Chronic myeloproliferative disease: Secondary | ICD-10-CM

## 2015-04-24 DIAGNOSIS — R05 Cough: Secondary | ICD-10-CM | POA: Diagnosis not present

## 2015-04-24 DIAGNOSIS — R059 Cough, unspecified: Secondary | ICD-10-CM

## 2015-04-24 DIAGNOSIS — I517 Cardiomegaly: Secondary | ICD-10-CM | POA: Insufficient documentation

## 2015-04-24 LAB — CBC
HCT: 39.4 % (ref 36.0–46.0)
Hemoglobin: 13 g/dL (ref 12.0–15.0)
MCH: 30.7 pg (ref 26.0–34.0)
MCHC: 33 g/dL (ref 30.0–36.0)
MCV: 93.1 fL (ref 78.0–100.0)
PLATELETS: 569 10*3/uL — AB (ref 150–400)
RBC: 4.23 MIL/uL (ref 3.87–5.11)
RDW: 16.6 % — AB (ref 11.5–15.5)
WBC: 9.9 10*3/uL (ref 4.0–10.5)

## 2015-04-25 ENCOUNTER — Encounter (HOSPITAL_BASED_OUTPATIENT_CLINIC_OR_DEPARTMENT_OTHER): Payer: Medicare Other | Admitting: Oncology

## 2015-04-25 ENCOUNTER — Encounter (HOSPITAL_COMMUNITY): Payer: Self-pay | Admitting: Oncology

## 2015-04-25 VITALS — BP 133/57 | HR 93 | Temp 98.0°F | Resp 18 | Wt 166.0 lb

## 2015-04-25 DIAGNOSIS — Z0000 Encounter for general adult medical examination without abnormal findings: Secondary | ICD-10-CM

## 2015-04-25 DIAGNOSIS — D47Z9 Other specified neoplasms of uncertain behavior of lymphoid, hematopoietic and related tissue: Secondary | ICD-10-CM | POA: Diagnosis not present

## 2015-04-25 DIAGNOSIS — I739 Peripheral vascular disease, unspecified: Secondary | ICD-10-CM | POA: Diagnosis not present

## 2015-04-25 DIAGNOSIS — Z8572 Personal history of non-Hodgkin lymphomas: Secondary | ICD-10-CM | POA: Diagnosis not present

## 2015-04-25 DIAGNOSIS — D509 Iron deficiency anemia, unspecified: Secondary | ICD-10-CM | POA: Diagnosis not present

## 2015-04-25 DIAGNOSIS — C8330 Diffuse large B-cell lymphoma, unspecified site: Secondary | ICD-10-CM

## 2015-04-25 DIAGNOSIS — I10 Essential (primary) hypertension: Secondary | ICD-10-CM | POA: Diagnosis not present

## 2015-04-25 DIAGNOSIS — D471 Chronic myeloproliferative disease: Secondary | ICD-10-CM

## 2015-04-25 DIAGNOSIS — Z0001 Encounter for general adult medical examination with abnormal findings: Secondary | ICD-10-CM | POA: Diagnosis not present

## 2015-04-25 NOTE — Assessment & Plan Note (Addendum)
JAK2 V617F positive myeloproliferative disorder presenting with thrombocytosis, with preservation of HGB/RBC/HCT and intermittent leukocytosis (lymphocyte predominance).  On Hydrea 500 mg M-F beginning on 04/08/2015 (after being on Hydrea 500 mg on M-W-F beginning on 01/10/2015).  She is tolerating her new dose Hydrea without any complaints. Her platelet count is responding appropriately. White blood cell count and hemoglobin remain intact.  Labs today: CBC diff.  Labs are stable.  Labs in 3 weeks: CBC  Labs in 6 weeks: CBC diff  Labs in 9 weeks: CBC diff (and other labs for surveillance of her DLBCL)  Continue ASA therapy as prescribed.  Return in 9 weeks for follow-up.

## 2015-04-25 NOTE — Progress Notes (Signed)
[place], MD [address] [place] [address]  JAK2+ MDP (myeloproliferative disorder) presenting with thrombocytosis (HCC)  Iron deficiency anemia  Diffuse large B-cell lymphoma, unspecified body region [facility])  Preventative health care - Plan: MM DIAG BREAST TOMO BILATERAL, US BREAST LTD UNI LEFT INC AXILLA, US BREAST LTD UNI RIGHT INC AXILLA  CURRENT THERAPY: Hydrea 500 mg PO M-F.  Surveillance per NCCN guidelines for DBCL  INTERVAL HISTORY: Leslie Williamson 82 y.o. female returns for  regular  visit for followup of JAK2+ myeloproliferative disorder presenting with thrombocytosis, thought to be secondary to splenectomy in the past with JAK2 testing in 2014 being negative.  HOWEVER, thrombocytosis progressed and repeat JAK2 is positive for V617F mutation on 01/22/2015. 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 AND   intermittent iron deficiency for which she received intravenous Feraheme.     NHL (non-Hodgkin's lymphoma) ([place])   07/19/1995 Pathology Results Spleen biopsy with resection- low-grade B-cell lymphoma, splenic marginal zone type   02/20/1998 Imaging CT CAP-marked and extensive cervical adenopathy   02/25/1998 Pathology Results Right cervical lymph node demonstrating large B-cell lymphoma   03/05/1998 Bone Marrow Biopsy No evidence of bone marrow involvement   03/11/1998 - 07/18/1998 Chemotherapy CHOP x 7 cycles   05/09/1998 Imaging CT CAP and neck- slight interval decrease in size of para-aortic adenopathy with interval decrease in right inguinal and bi-iliac adenopathy.  Interval decrease in size and number of enlarged cervical nodes   07/10/1998 Imaging Ct CAP and neck- unremarkable CT of chest. No enlarged retroperitoneal adenopathy withthe previously noted retroperitoneal nodes currently smaller to stable in size.   Cervical adenopathy is stable to slightly smaller in size.   08/18/1998 - 09/08/1998 Chemotherapy Rituxan Day 1, 8, 15, 22 x 1 cycle   01/08/1999 Remission CT CAP and neck demonstrates no adenopathy or disease   01/08/1999 Imaging CT CAP and neck- Stable CT of chest. Stable CT abd/pelvis without adenopathy.  Negative CT of neck    I personally reviewed and went over laboratory results with the patient.  The results are noted within this dictation.  Her platelet has come down slightly.  Blood cell counts within normal limits and stable, hemoglobin is 13.0 g/dL, and platelet count is 569,000 today.  Her Hydrea dose was increased to 500 mg M-F on 04/08/2015.    She is tolerating medication well. She denies any complaints associated with it.  I re-educated her regarding her new condition that we are treating with Hydrea. She wants to know if she can eat to change that. She also was know what she did wrong to get this condition. She is given education regarding JAK2 positive MPD. Additionally, she is educated on the role of Hydrea.  She is long overdue for mammography due to noncompliance. Orders are placed for mammogram and scheduled.  She is tolerating Hydrea well.  She denies any side effects of the medication.  Past Medical History  Diagnosis Date  . Iron deficiency anemia   . 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 Hypercholesteremia   . Obesity   . Hypokalemia   . PVD (peripheral vascular disease) ([place])   . Anxiety   . Depression   . NHL (non-Hodgkin's lymphoma) ([place]) 12/17/2010  . Hypertension   . Left hip  pain   . Vertigo, labyrinthine   . S/P splenectomy 01/01/2014  . Primary thrombocytosis ([place]) 01/11/2006    Secondary to splenectomy      has HYPERLIPIDEMIA; Iron deficiency anemia; JAK2+ MDP (myeloproliferative disorder) presenting with thrombocytosis ([place]); ANXIETY; CARPAL TUNNEL SYNDROME; HTN; PVD-  know Lt SFA disease, bilat PT and AT disease; ALLERGIC RHINITIS; GERD; SEBORRHEIC KERATOSIS; WEIGHT LOSS, ABNORMAL; COLONIC POLYPS, ADENOMATOUS, HX OF; NHL (non-Hodgkin's lymphoma) ([place]); Claudication of both lower extremities ([place]); and S/P splenectomy on her problem list.     is allergic to penicillins.  [name] does not currently have medications on file.  Past Surgical History  Procedure Laterality Date  . Port-a-cath removal      Denies any headaches, dizziness, double vision, fevers, chills, night sweats, nausea, vomiting, diarrhea, constipation, chest pain, heart palpitations, shortness of breath, blood in stool, black tarry stool, urinary pain, urinary burning, urinary frequency, hematuria.   PHYSICAL EXAMINATION  ECOG PERFORMANCE STATUS: 0 - Asymptomatic  Filed Vitals:   04/25/15 1445  BP: 133/57  Pulse: 93  Temp: 98 F (36.7 C)  Resp: 18    GENERAL:alert, no distress, well nourished, well developed, comfortable, cooperative and smiling, accompanied by her niece SKIN: skin color, texture, turgor are normal, many SKs throughout body HEAD: Normocephalic, No masses, lesions, tenderness or abnormalities EYES: EOMI, Conjunctiva are pink and non-injected EARS: External ears normal OROPHARYNX:mucous membranes are moist  NECK: supple, trachea midline LYMPH:  No cervical adenopathy noted. BREAST:not examined LUNGS: Clear to auscultation without any wheezes, rales, or rhonchi. HEART: Regular rhythm and rate without murmur, rub, or gallop.  ABDOMEN: Nontender and soft. BACK: not examined EXTREMITIES: No lower extremity edema.  NEURO: alert & oriented x 3 with fluent speech, no focal motor/sensory deficits, gait normal   LABORATORY DATA: CBC    Component Value Date/Time   WBC 9.9 04/24/2015 1106   RBC 4.23 04/24/2015 1106   RBC 4.78 06/30/2012 1008   HGB 13.0 04/24/2015 1106   HCT 39.4 04/24/2015 1106   PLT 569* 04/24/2015 1106   MCV 93.1 04/24/2015 1106   MCH  30.7 04/24/2015 1106   MCHC 33.0 04/24/2015 1106   RDW 16.6* 04/24/2015 1106   LYMPHSABS 3.5 04/08/2015 1212   MONOABS 1.3* 04/08/2015 1212   EOSABS 0.1 04/08/2015 1212   BASOSABS 0.0 04/08/2015 1212      Chemistry      Component Value Date/Time   NA 141 04/08/2015 1212   K 4.0 04/08/2015 1212   CL 108 04/08/2015 1212   CO2 23 04/08/2015 1212   BUN 18 04/08/2015 1212   CREATININE 1.01* 04/08/2015 1212      Component Value Date/Time   CALCIUM 9.0 04/08/2015 1212   ALKPHOS 60 04/08/2015 1212   AST 18 04/08/2015 1212   ALT 12* 04/08/2015 1212   BILITOT 0.4 04/08/2015 1212     Lab Results  Component Value Date   IRON 71 01/10/2015   TIBC 305 01/10/2015   FERRITIN 90 01/10/2015   JAK2 GenotypR Comment (A)   Comments: (NOTE)  Result: POSITIVE for the detection of the V617F mutation.             ASSESSMENT/PLAN:   JAK2+ MDP (myeloproliferative disorder) presenting with thrombocytosis (HCC) JAK2 V617F positive myeloproliferative disorder presenting with thrombocytosis, with preservation of HGB/RBC/HCT and intermittent leukocytosis (lymphocyte predominance).  On Hydrea 500 mg M-F beginning on 04/08/2015 (after being on Hydrea 500 mg on M-W-F beginning on 01/10/2015).  She is tolerating her new dose  Hydrea without any complaints. Her platelet count is responding appropriately. White blood cell count and hemoglobin remain intact.  Labs today: CBC diff.  Labs are stable.  Labs in 3 weeks: CBC  Labs in 6 weeks: CBC diff  Labs in 9 weeks: CBC diff (and other labs for surveillance of her DLBCL)  Continue ASA therapy as prescribed.  Return in 9 weeks for follow-up.    Iron deficiency anemia Intermittent iron deficiency for which she received intravenous Feraheme.  Oncology Flowsheet 07/18/2013  ferumoxytol [facility]) IV 1,020 mg   Labs in May 2017 and November 2017: iron/TIBC, ferritin  NHL (non-Hodgkin's lymphoma) ([place]) Richter's transformation from a low-grade  lymphoma to large cell high grade B-cell lymphoma treated with 6 cycles of R-CHOP ending on 09/08/1998 resulting in a complete remission.  Labs in May 2017: CBC diff, CMET, LDH, ESR, CRP, B2M  Labs in November 2017: CBC diff, CMET, LDH, ESR, CRP, B2M  She had a mammogram in Dec 2015 recommending a short-interval follow-up mammogram 6 months later.  This was not done.  Order placed for B/L diagnostic mammogram with Ultrasounds to be done this month.    THERAPY PLAN:  Continue Hydrea.  We will keep a close eye on her lab results and monitor for response.  Will adjust dose according to lab results.    She is to continue with surveillance for NHL as well. NCCN guidelines recommends the follow surveillance for Stage I-IV NHL for those who attain a complete response to therapy:  A. H&P every 3-6 months for 5 years, then yearly or as clinically indicated.  B. Labs every 3-6 months for 5 years and then annually or as clinically indicated.  C. Repeat CT scans only as clinically indicated.    All questions were answered. The patient knows to call the clinic with any problems, questions or concerns. We can certainly see the patient much sooner if necessary.  Patient and plan discussed with Dr. Ancil Linsey and she is in agreement with the aforementioned.   KEFALAS,THOMAS 04/25/2015 6:05 PM

## 2015-04-25 NOTE — Patient Instructions (Signed)
[place] at [facility] Discharge Instructions  RECOMMENDATIONS MADE BY THE CONSULTANT AND ANY TEST RESULTS WILL BE SENT TO YOUR REFERRING PHYSICIAN.  Exam and discussion today with Kirby Crigler, PA. Continue on your same dose of Hydrea (500 mg Monday-Friday). Lab work every 3 weeks. Office visit as scheduled with Dr. Whitney Muse on May 8th.  Thank you for choosing [place] at [facility] to provide your oncology and hematology care.  To afford each patient quality time with our provider, please arrive at least 15 minutes before your scheduled appointment time.   Beginning January 23rd 2017 lab work for the [facility] will be done in the  Main lab at [facility] on 1st floor. If you have a lab appointment with the [place] [facility] please come in thru the  Main Entrance and check in at the main information desk  You need to re-schedule your appointment should you arrive 10 or more minutes late.  We strive to give you quality time with our providers, and arriving late affects you and other patients whose appointments are after yours.  Also, if you no show three or more times for appointments you may be dismissed from the clinic at the providers discretion.     Again, [facility].  Our hope is that these requests will decrease the amount of time that you wait before being seen by our physicians.       _____________________________________________________________  Should you have questions after your visit to [facility], please contact our office at [phone] between the hours of 8:30 a.m. and 4:30 p.m.  Voicemails left after 4:30 p.m. will not be returned until the following business day.  For prescription refill requests, have your pharmacy contact our office.         Resources For Cancer Patients and their Caregivers ? American Cancer Society: Can assist with transportation, wigs, general  needs, runs Look Good Feel Better.        218-714-7063 ? Cancer Care: Provides financial assistance, online support groups, medication/co-pay assistance.  1-800-813-HOPE 915-275-9720) ? [place] Assists [place] cancer patients and their families through emotional , educational and financial support.  367-184-0346 ? [place] DSS Where to apply for food stamps, Medicaid and utility assistance. (902)005-3144 ? [facility]: Transportation to medical appointments. 416 384 5511 ? Social Security Administration: May apply for disability if have a Stage IV cancer. 408-222-2724 3105533628 ? [employer], Disability and Transit Services: Assists with nutrition, care and transit needs. [phone]

## 2015-04-25 NOTE — Assessment & Plan Note (Signed)
Intermittent iron deficiency for which she received intravenous Feraheme.  Oncology Flowsheet 07/18/2013  ferumoxytol [facility]) IV 1,020 mg   Labs in May 2017 and November 2017: iron/TIBC, ferritin

## 2015-04-25 NOTE — Assessment & Plan Note (Addendum)
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  Labs in May 2017: CBC diff, CMET, LDH, ESR, CRP, B2M  Labs in November 2017: CBC diff, CMET, LDH, ESR, CRP, B2M  She had a mammogram in Dec 2015 recommending a short-interval follow-up mammogram 6 months later.  This was not done.  Order placed for B/L diagnostic mammogram with Ultrasounds to be done this month.

## 2015-05-06 ENCOUNTER — Encounter (HOSPITAL_COMMUNITY): Payer: Medicare Other

## 2015-05-16 ENCOUNTER — Other Ambulatory Visit (HOSPITAL_COMMUNITY): Payer: Medicare Other

## 2015-05-26 ENCOUNTER — Other Ambulatory Visit (HOSPITAL_COMMUNITY): Payer: Self-pay | Admitting: Oncology

## 2015-06-03 DIAGNOSIS — C8330 Diffuse large B-cell lymphoma, unspecified site: Secondary | ICD-10-CM | POA: Diagnosis not present

## 2015-06-03 DIAGNOSIS — I739 Peripheral vascular disease, unspecified: Secondary | ICD-10-CM | POA: Diagnosis not present

## 2015-06-03 DIAGNOSIS — D471 Chronic myeloproliferative disease: Secondary | ICD-10-CM | POA: Diagnosis not present

## 2015-06-03 DIAGNOSIS — I10 Essential (primary) hypertension: Secondary | ICD-10-CM | POA: Diagnosis not present

## 2015-06-05 ENCOUNTER — Encounter (HOSPITAL_COMMUNITY): Payer: Medicare Other | Attending: Oncology

## 2015-06-05 ENCOUNTER — Other Ambulatory Visit (HOSPITAL_COMMUNITY): Payer: Self-pay | Admitting: Oncology

## 2015-06-05 DIAGNOSIS — C8590 Non-Hodgkin lymphoma, unspecified, unspecified site: Secondary | ICD-10-CM | POA: Insufficient documentation

## 2015-06-05 DIAGNOSIS — D75838 Other thrombocytosis: Secondary | ICD-10-CM

## 2015-06-05 DIAGNOSIS — D471 Chronic myeloproliferative disease: Secondary | ICD-10-CM

## 2015-06-05 DIAGNOSIS — Z9081 Acquired absence of spleen: Secondary | ICD-10-CM

## 2015-06-05 DIAGNOSIS — R7989 Other specified abnormal findings of blood chemistry: Secondary | ICD-10-CM

## 2015-06-05 DIAGNOSIS — D509 Iron deficiency anemia, unspecified: Secondary | ICD-10-CM

## 2015-06-05 DIAGNOSIS — C8330 Diffuse large B-cell lymphoma, unspecified site: Secondary | ICD-10-CM

## 2015-06-05 LAB — CBC WITH DIFFERENTIAL/PLATELET
Basophils Absolute: 0 10*3/uL (ref 0.0–0.1)
Basophils Relative: 0 %
Eosinophils Absolute: 0.1 10*3/uL (ref 0.0–0.7)
Eosinophils Relative: 1 %
HEMATOCRIT: 36.3 % (ref 36.0–46.0)
HEMOGLOBIN: 12.1 g/dL (ref 12.0–15.0)
LYMPHS ABS: 3.6 10*3/uL (ref 0.7–4.0)
Lymphocytes Relative: 39 %
MCH: 31.8 pg (ref 26.0–34.0)
MCHC: 33.3 g/dL (ref 30.0–36.0)
MCV: 95.5 fL (ref 78.0–100.0)
MONOS PCT: 10 %
Monocytes Absolute: 0.9 10*3/uL (ref 0.1–1.0)
NEUTROS ABS: 4.6 10*3/uL (ref 1.7–7.7)
NEUTROS PCT: 50 %
Platelets: 554 10*3/uL — ABNORMAL HIGH (ref 150–400)
RBC: 3.8 MIL/uL — ABNORMAL LOW (ref 3.87–5.11)
RDW: 18.5 % — ABNORMAL HIGH (ref 11.5–15.5)
WBC: 9.2 10*3/uL (ref 4.0–10.5)

## 2015-06-19 ENCOUNTER — Encounter (HOSPITAL_COMMUNITY): Payer: Medicare Other

## 2015-06-19 DIAGNOSIS — C8590 Non-Hodgkin lymphoma, unspecified, unspecified site: Secondary | ICD-10-CM | POA: Diagnosis not present

## 2015-06-19 DIAGNOSIS — D471 Chronic myeloproliferative disease: Secondary | ICD-10-CM

## 2015-06-19 LAB — CBC
HCT: 37.1 % (ref 36.0–46.0)
HEMOGLOBIN: 12.7 g/dL (ref 12.0–15.0)
MCH: 32.5 pg (ref 26.0–34.0)
MCHC: 34.2 g/dL (ref 30.0–36.0)
MCV: 94.9 fL (ref 78.0–100.0)
PLATELETS: 582 10*3/uL — AB (ref 150–400)
RBC: 3.91 MIL/uL (ref 3.87–5.11)
RDW: 18.5 % — ABNORMAL HIGH (ref 11.5–15.5)
WBC: 10.5 10*3/uL (ref 4.0–10.5)

## 2015-06-26 ENCOUNTER — Encounter (HOSPITAL_COMMUNITY): Payer: Self-pay | Admitting: Emergency Medicine

## 2015-06-30 ENCOUNTER — Encounter (HOSPITAL_COMMUNITY): Payer: Self-pay | Admitting: Hematology & Oncology

## 2015-06-30 ENCOUNTER — Encounter (HOSPITAL_COMMUNITY): Payer: Medicare Other

## 2015-06-30 ENCOUNTER — Encounter (HOSPITAL_COMMUNITY): Payer: Medicare Other | Attending: Oncology | Admitting: Hematology & Oncology

## 2015-06-30 ENCOUNTER — Other Ambulatory Visit (HOSPITAL_COMMUNITY): Payer: Self-pay | Admitting: Oncology

## 2015-06-30 VITALS — BP 113/64 | HR 110 | Temp 98.6°F | Resp 18 | Wt 166.6 lb

## 2015-06-30 DIAGNOSIS — C8330 Diffuse large B-cell lymphoma, unspecified site: Secondary | ICD-10-CM

## 2015-06-30 DIAGNOSIS — D509 Iron deficiency anemia, unspecified: Secondary | ICD-10-CM

## 2015-06-30 DIAGNOSIS — D471 Chronic myeloproliferative disease: Secondary | ICD-10-CM

## 2015-06-30 DIAGNOSIS — Z8572 Personal history of non-Hodgkin lymphomas: Secondary | ICD-10-CM

## 2015-06-30 DIAGNOSIS — D47Z9 Other specified neoplasms of uncertain behavior of lymphoid, hematopoietic and related tissue: Secondary | ICD-10-CM

## 2015-06-30 DIAGNOSIS — C8590 Non-Hodgkin lymphoma, unspecified, unspecified site: Secondary | ICD-10-CM | POA: Diagnosis not present

## 2015-06-30 DIAGNOSIS — R928 Other abnormal and inconclusive findings on diagnostic imaging of breast: Secondary | ICD-10-CM

## 2015-06-30 LAB — FERRITIN: Ferritin: 13 ng/mL (ref 11–307)

## 2015-06-30 LAB — CBC WITH DIFFERENTIAL/PLATELET
BASOS PCT: 0 %
Basophils Absolute: 0 10*3/uL (ref 0.0–0.1)
EOS ABS: 0.1 10*3/uL (ref 0.0–0.7)
EOS PCT: 1 %
HCT: 36.4 % (ref 36.0–46.0)
HEMOGLOBIN: 12.2 g/dL (ref 12.0–15.0)
Lymphocytes Relative: 50 %
Lymphs Abs: 4.4 10*3/uL — ABNORMAL HIGH (ref 0.7–4.0)
MCH: 32.2 pg (ref 26.0–34.0)
MCHC: 33.5 g/dL (ref 30.0–36.0)
MCV: 96 fL (ref 78.0–100.0)
MONOS PCT: 9 %
Monocytes Absolute: 0.8 10*3/uL (ref 0.1–1.0)
NEUTROS PCT: 40 %
Neutro Abs: 3.5 10*3/uL (ref 1.7–7.7)
PLATELETS: 695 10*3/uL — AB (ref 150–400)
RBC: 3.79 MIL/uL — ABNORMAL LOW (ref 3.87–5.11)
RDW: 18.5 % — ABNORMAL HIGH (ref 11.5–15.5)
WBC: 8.8 10*3/uL (ref 4.0–10.5)

## 2015-06-30 LAB — COMPREHENSIVE METABOLIC PANEL
ALBUMIN: 3.8 g/dL (ref 3.5–5.0)
ALK PHOS: 65 U/L (ref 38–126)
ALT: 12 U/L — AB (ref 14–54)
AST: 16 U/L (ref 15–41)
Anion gap: 10 (ref 5–15)
BUN: 19 mg/dL (ref 6–20)
CALCIUM: 9.3 mg/dL (ref 8.9–10.3)
CO2: 23 mmol/L (ref 22–32)
CREATININE: 0.98 mg/dL (ref 0.44–1.00)
Chloride: 105 mmol/L (ref 101–111)
GFR, EST NON AFRICAN AMERICAN: 52 mL/min — AB (ref >60)
Glucose, Bld: 130 mg/dL — ABNORMAL HIGH (ref 65–99)
Potassium: 3.6 mmol/L (ref 3.5–5.1)
SODIUM: 138 mmol/L (ref 135–145)
Total Bilirubin: 0.4 mg/dL (ref 0.3–1.2)
Total Protein: 7.3 g/dL (ref 6.5–8.1)

## 2015-06-30 LAB — IRON AND TIBC
Iron: 58 ug/dL (ref 28–170)
SATURATION RATIOS: 14 % (ref 10.4–31.8)
TIBC: 402 ug/dL (ref 250–450)
UIBC: 344 ug/dL

## 2015-06-30 LAB — SEDIMENTATION RATE: SED RATE: 19 mm/h (ref 0–22)

## 2015-06-30 LAB — LACTATE DEHYDROGENASE: LDH: 135 U/L (ref 98–192)

## 2015-06-30 LAB — C-REACTIVE PROTEIN: CRP: <0.5 mg/dL (ref <1.0)

## 2015-06-30 NOTE — Patient Instructions (Signed)
[place] at [facility] Discharge Instructions  RECOMMENDATIONS MADE BY THE CONSULTANT AND ANY TEST RESULTS WILL BE SENT TO YOUR REFERRING PHYSICIAN.  Exam done and seen today by Dr. Whitney Muse Labs every 2 weeks  Hydrea 500mg  Monday thru Sat. Return to see the doctor in 2 months Please call the clinic if you have any questions or concerns  Thank you for choosing [place] at [facility] to provide your oncology and hematology care.  To afford each patient quality time with our provider, please arrive at least 15 minutes before your scheduled appointment time.   Beginning January 23rd 2017 lab work for the [facility] will be done in the  Main lab at [facility] on 1st floor. If you have a lab appointment with the [place] please come in thru the  Main Entrance and check in at the main information desk  You need to re-schedule your appointment should you arrive 10 or more minutes late.  We strive to give you quality time with our providers, and arriving late affects you and other patients whose appointments are after yours.  Also, if you no show three or more times for appointments you may be dismissed from the clinic at the providers discretion.     Again, thank you for choosing [facility].  Our hope is that these requests will decrease the amount of time that you wait before being seen by our physicians.       _____________________________________________________________  Should you have questions after your visit to [facility], please contact our office at [phone] between the hours of 8:30 a.m. and 4:30 p.m.  Voicemails left after 4:30 p.m. will not be returned until the following business day.  For prescription refill requests, have your pharmacy contact our office.         Resources For Cancer Patients and their Caregivers ? American Cancer Society: Can assist with transportation, wigs, general  needs, runs Look Good Feel Better.        629-526-7960 ? Cancer Care: Provides financial assistance, online support groups, medication/co-pay assistance.  1-800-813-HOPE 445-197-5916) ? [place] Assists [place] cancer patients and their families through emotional , educational and financial support.  843-065-5600 ? [place] DSS Where to apply for food stamps, Medicaid and utility assistance. 620-278-3000 ? RC[employer]: Transportation to medical appointments. 438-629-0186 ? Social Security Administration: May apply for disability if have a Stage IV cancer. 330-219-5104 716-082-3076 ? [employer], Disability and Transit Services: Assists with nutrition, care and transit needs. [place] Support Programs: @10RELATIVEDAYS @ > Cancer Support Group  2nd Tuesday of the month 1pm-2pm, Journey Room  > Creative Journey  3rd Tuesday of the month 1130am-1pm, Journey Room  > Look Good Feel Better  1st Wednesday of the month 10am-12 noon, Journey Room (Call [place] to register [phone])

## 2015-06-30 NOTE — Progress Notes (Signed)
[place], MD [place] [address]  Diffuse large B-cell lymphoma, unspecified body region [facility]) - Plan: CBC with Differential, CBC with Differential JAK 2 positive MPD  CURRENT THERAPY: Hydrea 500 mg PO M-F.  Surveillance per NCCN guidelines for DBCL  INTERVAL HISTORY: Leslie Williamson 82 y.o. female returns for  regular  visit for followup of JAK2+ myeloproliferative disorder presenting with thrombocytosis, thought to be secondary to splenectomy in the past with JAK2 testing in 2014 being negative.  HOWEVER, thrombocytosis progressed and repeat JAK2 is positive for V617F mutation on 01/22/2015. AND transformation from a low-grade lymphoma to large cell high grade B-cell lymphoma treated with 6 cycles of R.-CHOP ending on 09/08/1998 resulting in a complete remission. AND   intermittent iron deficiency for which she received intravenous Feraheme.     NHL (non-Hodgkin's lymphoma) ([place])   07/19/1995 Pathology Results Spleen biopsy with resection- low-grade B-cell lymphoma, splenic marginal zone type   02/20/1998 Imaging CT CAP-marked and extensive cervical adenopathy   02/25/1998 Pathology Results Right cervical lymph node demonstrating large B-cell lymphoma   03/05/1998 Bone Marrow Biopsy No evidence of bone marrow involvement   03/11/1998 - 07/18/1998 Chemotherapy CHOP x 7 cycles   05/09/1998 Imaging CT CAP and neck- slight interval decrease in size of para-aortic adenopathy with interval decrease in right inguinal and bi-iliac adenopathy.  Interval decrease in size and number of enlarged cervical nodes   07/10/1998 Imaging Ct CAP and neck- unremarkable CT of chest. No enlarged retroperitoneal adenopathy withthe previously noted retroperitoneal nodes currently smaller to stable in size.  Cervical adenopathy is stable to slightly smaller in size.   08/18/1998 - 09/08/1998 Chemotherapy Rituxan Day 1, 8, 15, 22 x 1 cycle   01/08/1999 Remission CT CAP and neck  demonstrates no adenopathy or disease   01/08/1999 Imaging CT CAP and neck- Stable CT of chest. Stable CT abd/pelvis without adenopathy.  Negative CT of neck    Leslie Williamson is unaccompanied. I personally reviewed and went over laboratory studies with the patient.  She received a letter detailing how to increase her Hydrea dose, though she did not understand it. I have reviewed this information with her. She has been taking her medication for 5 days.   She did not go to her scheduled mammogram. She does not like having her mammogram done. She knows she should go, but "they pull on my breasts so bad".   Her appetite is good. Denies nausea with Hydrea.    Past Medical History  Diagnosis Date  . Iron deficiency anemia   . 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 Hypercholesteremia   . Obesity   . Hypokalemia   . PVD (peripheral vascular disease) ([place])   . Anxiety   . Depression   . NHL (non-Hodgkin's lymphoma) ([place]) 12/17/2010  . Hypertension   . Left hip pain   . Vertigo, labyrinthine   . S/P splenectomy 01/01/2014  . Primary thrombocytosis ([place]) 01/11/2006    Secondary to splenectomy      has HYPERLIPIDEMIA; Iron deficiency anemia; JAK2+ MDP (myeloproliferative disorder) presenting with thrombocytosis ([place]); ANXIETY; CARPAL TUNNEL SYNDROME; HTN; PVD- know Lt SFA disease, bilat PT and AT disease; ALLERGIC RHINITIS; GERD; SEBORRHEIC KERATOSIS; WEIGHT LOSS, ABNORMAL; COLONIC POLYPS, ADENOMATOUS, HX OF; NHL (non-Hodgkin's lymphoma) ([place]); Claudication of both lower extremities ([place]); and S/P splenectomy on her problem list.     is allergic  to penicillins.  Leslie Williamson does not currently have medications on file.  Past Surgical History  Procedure Laterality Date  . Port-a-cath removal      Denies any headaches, dizziness, double vision, fevers, chills, night sweats, nausea, vomiting, diarrhea,  constipation, chest pain, heart palpitations, shortness of breath, blood in stool, black tarry stool, urinary pain, urinary burning, urinary frequency, hematuria.   PHYSICAL EXAMINATION  ECOG PERFORMANCE STATUS: 0 - Asymptomatic  Filed Vitals:   06/30/15 1339  BP: 113/64  Pulse: 110  Temp: 98.6 F (37 C)  Resp: 18    GENERAL:alert, no distress, well nourished, well developed, comfortable, cooperative and smiling SKIN: skin color, texture, turgor are normal,  HEAD: Normocephalic, No masses, lesions, tenderness or abnormalities EYES: EOMI, Conjunctiva are pink and non-injected EARS: External ears normal OROPHARYNX:mucous membranes are moist  NECK: supple, trachea midline LYMPH:  No cervical adenopathy noted. BREAST:not examined LUNGS: Clear to auscultation without any wheezes, rales, or rhonchi. HEART: Regular rhythm and rate without murmur, rub, or gallop.  ABDOMEN: Nontender and soft. BACK: not examined EXTREMITIES: No lower extremity edema.  NEURO: alert & oriented x 3 with fluent speech, no focal motor/sensory deficits, gait normal   LABORATORY DATA: I have reviewed the data as listed.  Results for Leslie, Williamson (MRN [id number]) as of 06/30/2015 16:30  Ref. Range 06/30/2015 13:12 06/30/2015 13:50  Sodium Latest Ref Range: 135-145 mmol/L 138   Potassium Latest Ref Range: 3.5-5.1 mmol/L 3.6   Chloride Latest Ref Range: 101-111 mmol/L 105   CO2 Latest Ref Range: 22-32 mmol/L 23   BUN Latest Ref Range: 6-20 mg/dL 19   Creatinine Latest Ref Range: 0.44-1.00 mg/dL 0.98   Calcium Latest Ref Range: 8.9-10.3 mg/dL 9.3   EGFR (Non-African Amer.) Latest Ref Range: >60 mL/min 52 (L)   EGFR (African American) Latest Ref Range: >60 mL/min >60   Glucose Latest Ref Range: 65-99 mg/dL 130 (H)   Anion gap Latest Ref Range: 5-15  10   Alkaline Phosphatase Latest Ref Range: 38-126 U/L 65   Albumin Latest Ref Range: 3.5-5.0 g/dL 3.8   AST Latest Ref Range: 15-41 U/L 16   ALT Latest Ref  Range: 14-54 U/L 12 (L)   Total Protein Latest Ref Range: 6.5-8.1 g/dL 7.3   Total Bilirubin Latest Ref Range: 0.3-1.2 mg/dL 0.4   LDH Latest Ref Range: 98-192 U/L 135   WBC Latest Ref Range: 4.0-10.5 K/uL  8.8  RBC Latest Ref Range: 3.87-5.11 MIL/uL  3.79 (L)  Hemoglobin Latest Ref Range: 12.0-15.0 g/dL  12.2  HCT Latest Ref Range: 36.0-46.0 %  36.4  MCV Latest Ref Range: 78.0-100.0 fL  96.0  MCH Latest Ref Range: 26.0-34.0 pg  32.2  MCHC Latest Ref Range: 30.0-36.0 g/dL  33.5  RDW Latest Ref Range: 11.5-15.5 %  18.5 (H)  Platelets Latest Ref Range: 150-400 K/uL  695 (H)  Neutrophils Latest Units: %  40  Lymphocytes Latest Units: %  50  Monocytes Relative Latest Units: %  9  Eosinophil Latest Units: %  1  Basophil Latest Units: %  0  NEUT# Latest Ref Range: 1.7-7.7 K/uL  3.5  Lymphocyte # Latest Ref Range: 0.7-4.0 K/uL  4.4 (H)  Monocyte # Latest Ref Range: 0.1-1.0 K/uL  0.8  Eosinophils Absolute Latest Ref Range: 0.0-0.7 K/uL  0.1  Basophils Absolute Latest Ref Range: 0.0-0.1 K/uL  0.0  Sed Rate Latest Ref Range: 0-22 mm/hr 19     Lab Results  Component Value Date   IRON  71 01/10/2015   TIBC 305 01/10/2015   FERRITIN 90 01/10/2015   JAK2 GenotypR Comment (A)   Comments: (NOTE)  Result: POSITIVE for the detection of the V617F mutation.             ASSESSMENT/PLAN:  Diffuse large B-cell lymphoma, unspecified body region ([place]) - Plan: CBC with Differential, CBC with Differential JAK 2 positive MPD Abnormal mammogram  She states she is taking her hydrea 5 times weekly, I have explained to her to take her hydrea M - Sat once daily.  We will check labs every 2 weeks and gradually increase her hydrea dose as needed; goal platelet count is 450K.  I have encouraged her to go and get her repeat mammography. She notes that she will think on this. The orders are in, she just needs to go down to radiology.  THERAPY PLAN:  Continue Hydrea.  We will keep a close eye on her  lab results and monitor for response.  Will adjust dose according to lab results.    She is to continue with surveillance for NHL as well. NCCN guidelines recommends the follow surveillance for Stage I-IV NHL for those who attain a complete response to therapy:  A. H&P every 3-6 months for 5 years, then yearly or as clinically indicated.  B. Labs every 3-6 months for 5 years and then annually or as clinically indicated.  C. Repeat CT scans only as clinically indicated.   She will return for follow up in 2 months.  All questions were answered. The patient knows to call the clinic with any problems, questions or concerns. We can certainly see the patient much sooner if necessary.  This document serves as a record of services personally performed by Ancil Linsey, MD. It was created on her behalf by Arlyce Harman, a trained medical scribe. The creation of this record is based on the scribe's personal observations and the provider's statements to them. This document has been checked and approved by the attending provider.  I have reviewed the above documentation for accuracy and completeness, and I agree with the above.   Molli Hazard, MD  06/30/2015 2:02 PM

## 2015-07-01 LAB — BETA 2 MICROGLOBULIN, SERUM: BETA 2 MICROGLOBULIN: 2 mg/L (ref 0.6–2.4)

## 2015-07-10 ENCOUNTER — Other Ambulatory Visit (HOSPITAL_COMMUNITY): Payer: Medicare Other

## 2015-07-14 ENCOUNTER — Other Ambulatory Visit (HOSPITAL_COMMUNITY): Payer: Self-pay | Admitting: Oncology

## 2015-07-14 ENCOUNTER — Encounter (HOSPITAL_COMMUNITY): Payer: Medicare Other

## 2015-07-14 DIAGNOSIS — E876 Hypokalemia: Secondary | ICD-10-CM

## 2015-07-14 DIAGNOSIS — C8590 Non-Hodgkin lymphoma, unspecified, unspecified site: Secondary | ICD-10-CM | POA: Diagnosis not present

## 2015-07-14 DIAGNOSIS — C8330 Diffuse large B-cell lymphoma, unspecified site: Secondary | ICD-10-CM

## 2015-07-14 LAB — COMPREHENSIVE METABOLIC PANEL
ALK PHOS: 71 U/L (ref 38–126)
ALT: 12 U/L — ABNORMAL LOW (ref 14–54)
ANION GAP: 9 (ref 5–15)
AST: 17 U/L (ref 15–41)
Albumin: 3.8 g/dL (ref 3.5–5.0)
BUN: 18 mg/dL (ref 6–20)
CALCIUM: 9 mg/dL (ref 8.9–10.3)
CHLORIDE: 106 mmol/L (ref 101–111)
CO2: 22 mmol/L (ref 22–32)
Creatinine, Ser: 0.95 mg/dL (ref 0.44–1.00)
GFR calc non Af Amer: 54 mL/min — ABNORMAL LOW (ref >60)
GLUCOSE: 167 mg/dL — AB (ref 65–99)
Potassium: 3.3 mmol/L — ABNORMAL LOW (ref 3.5–5.1)
Sodium: 137 mmol/L (ref 135–145)
Total Bilirubin: 0.4 mg/dL (ref 0.3–1.2)
Total Protein: 7.4 g/dL (ref 6.5–8.1)

## 2015-07-14 MED ORDER — POTASSIUM CHLORIDE CRYS ER 20 MEQ PO TBCR: 20.0000 meq | EXTENDED_RELEASE_TABLET | Freq: Every day | ORAL | Status: DC

## 2015-07-15 ENCOUNTER — Encounter (HOSPITAL_COMMUNITY): Payer: Medicare Other

## 2015-07-15 DIAGNOSIS — C8590 Non-Hodgkin lymphoma, unspecified, unspecified site: Secondary | ICD-10-CM | POA: Diagnosis not present

## 2015-07-15 DIAGNOSIS — C8330 Diffuse large B-cell lymphoma, unspecified site: Secondary | ICD-10-CM

## 2015-07-15 LAB — CBC WITH DIFFERENTIAL/PLATELET
BASOS ABS: 0 10*3/uL (ref 0.0–0.1)
BASOS PCT: 0 %
Eosinophils Absolute: 0.1 10*3/uL (ref 0.0–0.7)
Eosinophils Relative: 1 %
HEMATOCRIT: 38.8 % (ref 36.0–46.0)
HEMOGLOBIN: 12.9 g/dL (ref 12.0–15.0)
LYMPHS PCT: 49 %
Lymphs Abs: 4.5 10*3/uL — ABNORMAL HIGH (ref 0.7–4.0)
MCH: 33.2 pg (ref 26.0–34.0)
MCHC: 33.2 g/dL (ref 30.0–36.0)
MCV: 100 fL (ref 78.0–100.0)
MONO ABS: 0.6 10*3/uL (ref 0.1–1.0)
MONOS PCT: 6 %
NEUTROS ABS: 4.1 10*3/uL (ref 1.7–7.7)
NEUTROS PCT: 44 %
Platelets: 558 10*3/uL — ABNORMAL HIGH (ref 150–400)
RBC: 3.88 MIL/uL (ref 3.87–5.11)
RDW: 19 % — ABNORMAL HIGH (ref 11.5–15.5)
WBC: 9.2 10*3/uL (ref 4.0–10.5)

## 2015-07-28 ENCOUNTER — Encounter (HOSPITAL_COMMUNITY): Payer: Medicare Other | Attending: Hematology & Oncology

## 2015-07-28 DIAGNOSIS — D509 Iron deficiency anemia, unspecified: Secondary | ICD-10-CM

## 2015-07-28 DIAGNOSIS — D75838 Other thrombocytosis: Secondary | ICD-10-CM

## 2015-07-28 DIAGNOSIS — Z9081 Acquired absence of spleen: Secondary | ICD-10-CM

## 2015-07-28 DIAGNOSIS — C8330 Diffuse large B-cell lymphoma, unspecified site: Secondary | ICD-10-CM | POA: Diagnosis not present

## 2015-07-28 DIAGNOSIS — R7989 Other specified abnormal findings of blood chemistry: Secondary | ICD-10-CM

## 2015-07-28 LAB — CBC WITH DIFFERENTIAL/PLATELET
BASOS ABS: 0 10*3/uL (ref 0.0–0.1)
Basophils Relative: 0 %
EOS PCT: 1 %
Eosinophils Absolute: 0.1 10*3/uL (ref 0.0–0.7)
HEMATOCRIT: 37.7 % (ref 36.0–46.0)
HEMOGLOBIN: 12.2 g/dL (ref 12.0–15.0)
LYMPHS ABS: 3.3 10*3/uL (ref 0.7–4.0)
LYMPHS PCT: 44 %
MCH: 32.9 pg (ref 26.0–34.0)
MCHC: 32.4 g/dL (ref 30.0–36.0)
MCV: 101.6 fL — AB (ref 78.0–100.0)
Monocytes Absolute: 0.7 10*3/uL (ref 0.1–1.0)
Monocytes Relative: 10 %
NEUTROS ABS: 3.4 10*3/uL (ref 1.7–7.7)
NEUTROS PCT: 45 %
Platelets: 557 10*3/uL — ABNORMAL HIGH (ref 150–400)
RBC: 3.71 MIL/uL — AB (ref 3.87–5.11)
RDW: 18.6 % — ABNORMAL HIGH (ref 11.5–15.5)
WBC: 7.5 10*3/uL (ref 4.0–10.5)

## 2015-07-28 LAB — COMPREHENSIVE METABOLIC PANEL
ALK PHOS: 65 U/L (ref 38–126)
ALT: 12 U/L — AB (ref 14–54)
AST: 18 U/L (ref 15–41)
Albumin: 3.9 g/dL (ref 3.5–5.0)
Anion gap: 7 (ref 5–15)
BILIRUBIN TOTAL: 0.4 mg/dL (ref 0.3–1.2)
BUN: 15 mg/dL (ref 6–20)
CALCIUM: 9 mg/dL (ref 8.9–10.3)
CHLORIDE: 108 mmol/L (ref 101–111)
CO2: 24 mmol/L (ref 22–32)
CREATININE: 0.87 mg/dL (ref 0.44–1.00)
Glucose, Bld: 154 mg/dL — ABNORMAL HIGH (ref 65–99)
Potassium: 4.2 mmol/L (ref 3.5–5.1)
Sodium: 139 mmol/L (ref 135–145)
Total Protein: 7.2 g/dL (ref 6.5–8.1)

## 2015-08-01 DIAGNOSIS — H35363 Drusen (degenerative) of macula, bilateral: Secondary | ICD-10-CM | POA: Diagnosis not present

## 2015-08-01 DIAGNOSIS — H3540 Unspecified peripheral retinal degeneration: Secondary | ICD-10-CM | POA: Diagnosis not present

## 2015-08-11 ENCOUNTER — Encounter (HOSPITAL_COMMUNITY): Payer: Medicare Other

## 2015-08-11 DIAGNOSIS — C8330 Diffuse large B-cell lymphoma, unspecified site: Secondary | ICD-10-CM

## 2015-08-11 LAB — COMPREHENSIVE METABOLIC PANEL
ALT: 12 U/L — ABNORMAL LOW (ref 14–54)
AST: 16 U/L (ref 15–41)
Albumin: 3.6 g/dL (ref 3.5–5.0)
Alkaline Phosphatase: 58 U/L (ref 38–126)
Anion gap: 6 (ref 5–15)
BILIRUBIN TOTAL: 0.6 mg/dL (ref 0.3–1.2)
BUN: 18 mg/dL (ref 6–20)
CO2: 24 mmol/L (ref 22–32)
CREATININE: 0.99 mg/dL (ref 0.44–1.00)
Calcium: 8.8 mg/dL — ABNORMAL LOW (ref 8.9–10.3)
Chloride: 108 mmol/L (ref 101–111)
GFR, EST AFRICAN AMERICAN: 60 mL/min — AB (ref >60)
GFR, EST NON AFRICAN AMERICAN: 52 mL/min — AB (ref >60)
Glucose, Bld: 121 mg/dL — ABNORMAL HIGH (ref 65–99)
POTASSIUM: 3.9 mmol/L (ref 3.5–5.1)
Sodium: 138 mmol/L (ref 135–145)
TOTAL PROTEIN: 6.8 g/dL (ref 6.5–8.1)

## 2015-08-11 LAB — CBC WITH DIFFERENTIAL/PLATELET
BASOS ABS: 0 10*3/uL (ref 0.0–0.1)
Basophils Relative: 0 %
Eosinophils Absolute: 0.1 10*3/uL (ref 0.0–0.7)
Eosinophils Relative: 1 %
HEMATOCRIT: 33.9 % — AB (ref 36.0–46.0)
Hemoglobin: 11.2 g/dL — ABNORMAL LOW (ref 12.0–15.0)
LYMPHS ABS: 3.9 10*3/uL (ref 0.7–4.0)
LYMPHS PCT: 47 %
MCH: 33.9 pg (ref 26.0–34.0)
MCHC: 33 g/dL (ref 30.0–36.0)
MCV: 102.7 fL — AB (ref 78.0–100.0)
MONO ABS: 0.6 10*3/uL (ref 0.1–1.0)
MONOS PCT: 7 %
NEUTROS ABS: 3.6 10*3/uL (ref 1.7–7.7)
Neutrophils Relative %: 45 %
Platelets: 542 10*3/uL — ABNORMAL HIGH (ref 150–400)
RBC: 3.3 MIL/uL — ABNORMAL LOW (ref 3.87–5.11)
RDW: 18.4 % — AB (ref 11.5–15.5)
WBC: 8.2 10*3/uL (ref 4.0–10.5)

## 2015-08-25 ENCOUNTER — Other Ambulatory Visit (HOSPITAL_COMMUNITY): Payer: Medicare Other

## 2015-08-25 ENCOUNTER — Ambulatory Visit (HOSPITAL_COMMUNITY): Payer: Medicare Other | Admitting: Hematology & Oncology

## 2015-08-27 ENCOUNTER — Encounter (HOSPITAL_COMMUNITY): Payer: Medicare Other | Attending: Hematology & Oncology

## 2015-08-27 DIAGNOSIS — C8330 Diffuse large B-cell lymphoma, unspecified site: Secondary | ICD-10-CM | POA: Insufficient documentation

## 2015-08-27 LAB — COMPREHENSIVE METABOLIC PANEL
ALK PHOS: 64 U/L (ref 38–126)
ALT: 16 U/L (ref 14–54)
AST: 17 U/L (ref 15–41)
Albumin: 3.7 g/dL (ref 3.5–5.0)
Anion gap: 6 (ref 5–15)
BUN: 12 mg/dL (ref 6–20)
CALCIUM: 8.8 mg/dL — AB (ref 8.9–10.3)
CHLORIDE: 109 mmol/L (ref 101–111)
CO2: 24 mmol/L (ref 22–32)
CREATININE: 0.93 mg/dL (ref 0.44–1.00)
GFR calc Af Amer: >60 mL/min (ref >60)
GFR calc non Af Amer: 56 mL/min — ABNORMAL LOW (ref >60)
Glucose, Bld: 98 mg/dL (ref 65–99)
Potassium: 3.5 mmol/L (ref 3.5–5.1)
SODIUM: 139 mmol/L (ref 135–145)
Total Bilirubin: 0.5 mg/dL (ref 0.3–1.2)
Total Protein: 7.1 g/dL (ref 6.5–8.1)

## 2015-08-27 LAB — CBC WITH DIFFERENTIAL/PLATELET
BASOS ABS: 0 10*3/uL (ref 0.0–0.1)
Basophils Relative: 0 %
EOS PCT: 1 %
Eosinophils Absolute: 0.1 10*3/uL (ref 0.0–0.7)
HCT: 37.4 % (ref 36.0–46.0)
HEMOGLOBIN: 12.5 g/dL (ref 12.0–15.0)
LYMPHS PCT: 47 %
Lymphs Abs: 3.9 10*3/uL (ref 0.7–4.0)
MCH: 34.4 pg — ABNORMAL HIGH (ref 26.0–34.0)
MCHC: 33.4 g/dL (ref 30.0–36.0)
MCV: 103 fL — AB (ref 78.0–100.0)
Monocytes Absolute: 0.8 10*3/uL (ref 0.1–1.0)
Monocytes Relative: 9 %
NEUTROS PCT: 43 %
Neutro Abs: 3.5 10*3/uL (ref 1.7–7.7)
PLATELETS: 469 10*3/uL — AB (ref 150–400)
RBC: 3.63 MIL/uL — AB (ref 3.87–5.11)
RDW: 17.1 % — ABNORMAL HIGH (ref 11.5–15.5)
WBC: 8.3 10*3/uL (ref 4.0–10.5)

## 2015-09-09 ENCOUNTER — Other Ambulatory Visit (HOSPITAL_COMMUNITY): Payer: Self-pay | Admitting: Internal Medicine

## 2015-09-09 ENCOUNTER — Ambulatory Visit (HOSPITAL_COMMUNITY)
Admission: RE | Admit: 2015-09-09 | Discharge: 2015-09-09 | Disposition: A | Discharge status: Home / Self Care | Payer: Medicare Other | Source: Ambulatory Visit | Attending: Internal Medicine | Admitting: Internal Medicine

## 2015-09-09 DIAGNOSIS — C8580 Other specified types of non-Hodgkin lymphoma, unspecified site: Secondary | ICD-10-CM | POA: Diagnosis not present

## 2015-09-09 DIAGNOSIS — R7303 Prediabetes: Secondary | ICD-10-CM | POA: Diagnosis not present

## 2015-09-09 DIAGNOSIS — R937 Abnormal findings on diagnostic imaging of other parts of musculoskeletal system: Secondary | ICD-10-CM | POA: Diagnosis not present

## 2015-09-09 DIAGNOSIS — I739 Peripheral vascular disease, unspecified: Secondary | ICD-10-CM | POA: Diagnosis not present

## 2015-09-09 DIAGNOSIS — M25511 Pain in right shoulder: Secondary | ICD-10-CM | POA: Insufficient documentation

## 2015-09-09 DIAGNOSIS — M19011 Primary osteoarthritis, right shoulder: Secondary | ICD-10-CM | POA: Diagnosis not present

## 2015-09-09 DIAGNOSIS — D509 Iron deficiency anemia, unspecified: Secondary | ICD-10-CM | POA: Diagnosis not present

## 2015-09-09 DIAGNOSIS — D471 Chronic myeloproliferative disease: Secondary | ICD-10-CM | POA: Diagnosis not present

## 2015-09-09 DIAGNOSIS — R739 Hyperglycemia, unspecified: Secondary | ICD-10-CM | POA: Diagnosis not present

## 2015-09-09 DIAGNOSIS — I10 Essential (primary) hypertension: Secondary | ICD-10-CM | POA: Diagnosis not present

## 2015-09-15 DIAGNOSIS — H25813 Combined forms of age-related cataract, bilateral: Secondary | ICD-10-CM | POA: Diagnosis not present

## 2015-09-17 ENCOUNTER — Other Ambulatory Visit (HOSPITAL_COMMUNITY): Payer: Self-pay | Admitting: Oncology

## 2015-09-18 ENCOUNTER — Telehealth (HOSPITAL_COMMUNITY): Payer: Self-pay

## 2015-09-18 ENCOUNTER — Other Ambulatory Visit (HOSPITAL_COMMUNITY): Payer: Self-pay | Admitting: Oncology

## 2015-09-18 DIAGNOSIS — C8330 Diffuse large B-cell lymphoma, unspecified site: Secondary | ICD-10-CM

## 2015-09-18 DIAGNOSIS — D471 Chronic myeloproliferative disease: Secondary | ICD-10-CM

## 2015-09-18 MED ORDER — HYDROXYUREA 500 MG PO CAPS: ORAL_CAPSULE | ORAL | 1 refills | Status: DC

## 2015-09-18 NOTE — Telephone Encounter (Signed)
Patient came by cancer center for refill on hydrea. Reviewed last note that says she is to continue hydrea Monday -Saturday. Her last Hydrea prescription was written in April for 30 tabs with one refill. Patient states she is taking the hydrea Monday-Friday and has not missed a dose until today. Not sure how she could have taken it since April with only 1 prescription and 1 refill. Re-educated the patient to take as prescribed and try not to miss any doses. Patient verbalized understanding.

## 2015-09-23 ENCOUNTER — Encounter (HOSPITAL_COMMUNITY): Payer: Medicare Other | Attending: Oncology | Admitting: Hematology & Oncology

## 2015-09-23 ENCOUNTER — Encounter (HOSPITAL_COMMUNITY): Payer: Self-pay | Admitting: Hematology & Oncology

## 2015-09-23 VITALS — BP 104/60 | HR 101 | Temp 98.0°F | Resp 18 | Wt 163.0 lb

## 2015-09-23 DIAGNOSIS — D509 Iron deficiency anemia, unspecified: Secondary | ICD-10-CM

## 2015-09-23 DIAGNOSIS — Z8572 Personal history of non-Hodgkin lymphomas: Secondary | ICD-10-CM | POA: Diagnosis not present

## 2015-09-23 DIAGNOSIS — D47Z9 Other specified neoplasms of uncertain behavior of lymphoid, hematopoietic and related tissue: Secondary | ICD-10-CM | POA: Diagnosis not present

## 2015-09-23 DIAGNOSIS — D7589 Other specified diseases of blood and blood-forming organs: Secondary | ICD-10-CM

## 2015-09-23 DIAGNOSIS — C8330 Diffuse large B-cell lymphoma, unspecified site: Secondary | ICD-10-CM

## 2015-09-23 DIAGNOSIS — D471 Chronic myeloproliferative disease: Secondary | ICD-10-CM

## 2015-09-23 DIAGNOSIS — R928 Other abnormal and inconclusive findings on diagnostic imaging of breast: Secondary | ICD-10-CM

## 2015-09-23 DIAGNOSIS — C8590 Non-Hodgkin lymphoma, unspecified, unspecified site: Secondary | ICD-10-CM | POA: Insufficient documentation

## 2015-09-23 NOTE — Patient Instructions (Signed)
[place] at [facility] Discharge Instructions  RECOMMENDATIONS MADE BY THE CONSULTANT AND ANY TEST RESULTS WILL BE SENT TO YOUR REFERRING PHYSICIAN.  Exam done and seen today by Dr. Whitney Muse Labs every 4 weeks times 6. Return to see the doctor in 2 months Please call the clinic if you have any questions or concerns  Thank you for choosing [place] at [facility] to provide your oncology and hematology care.  To afford each patient quality time with our provider, please arrive at least 15 minutes before your scheduled appointment time.   Beginning January 23rd 2017 lab work for the [facility] will be done in the  Main lab at [facility] on 1st floor. If you have a lab appointment with the [facility] please come in thru the  Main Entrance and check in at the main information desk  You need to re-schedule your appointment should you arrive 10 or more minutes late.  We strive to give you quality time with our providers, and arriving late affects you and other patients whose appointments are after yours.  Also, if you no show three or more times for appointments you may be dismissed from the clinic at the providers discretion.     Again, thank you for choosing [facility].  Our hope is that these requests will decrease the amount of time that you wait before being seen by our physicians.       _____________________________________________________________  Should you have questions after your visit to [facility], please contact our office at [phone] between the hours of 8:30 a.m. and 4:30 p.m.  Voicemails left after 4:30 p.m. will not be returned until the following business day.  For prescription refill requests, have your pharmacy contact our office.         Resources For Cancer Patients and their Caregivers ? American Cancer Society: Can assist with transportation, wigs, general needs, runs Look Good  Feel Better.        (613) 374-1894 ? Cancer Care: Provides financial assistance, online support groups, medication/co-pay assistance.  1-800-813-HOPE 270 842 9502) ? [facility] Assists [place] cancer patients and their families through emotional , educational and financial support.  934-504-5156 ? [place] DSS Where to apply for food stamps, Medicaid and utility assistance. 478 647 2801 ? [facility]: Transportation to medical appointments. (531)209-7199 ? Social Security Administration: May apply for disability if have a Stage IV cancer. (586) 772-1278 747-195-8568 ? [employer], Disability and Transit Services: Assists with nutrition, care and transit needs. [place] Support Programs: @10RELATIVEDAYS @ > Cancer Support Group  2nd Tuesday of the month 1pm-2pm, Journey Room  > Creative Journey  3rd Tuesday of the month 1130am-1pm, Journey Room  > Look Good Feel Better  1st Wednesday of the month 10am-12 noon, Journey Room (Call [place] to register [phone])

## 2015-09-23 NOTE — Progress Notes (Signed)
[place], MD [address] [place] [address]  JAK 2 positive MPD AND   NHL (non-Hodgkin's lymphoma) ([place])   07/19/1995 Pathology Results    Spleen biopsy with resection- low-grade B-cell lymphoma, splenic marginal zone type      02/20/1998 Imaging    CT CAP-marked and extensive cervical adenopathy      02/25/1998 Pathology Results    Right cervical lymph node demonstrating large B-cell lymphoma      03/05/1998 Bone Marrow Biopsy    No evidence of bone marrow involvement      03/11/1998 - 07/18/1998 Chemotherapy    CHOP x 7 cycles      05/09/1998 Imaging    CT CAP and neck- slight interval decrease in size of para-aortic adenopathy with interval decrease in right inguinal and bi-iliac adenopathy.  Interval decrease in size and number of enlarged cervical nodes      07/10/1998 Imaging    Ct CAP and neck- unremarkable CT of chest. No enlarged retroperitoneal adenopathy withthe previously noted retroperitoneal nodes currently smaller to stable in size.  Cervical adenopathy is stable to slightly smaller in size.      08/18/1998 - 09/08/1998 Chemotherapy    Rituxan Day 1, 8, 15, 22 x 1 cycle      01/08/1999 Remission    CT CAP and neck demonstrates no adenopathy or disease      01/08/1999 Imaging    CT CAP and neck- Stable CT of chest. Stable CT abd/pelvis without adenopathy.  Negative CT of neck       CURRENT THERAPY: Hydrea 500 mg PO M-F.  Surveillance per NCCN guidelines for DBCL  INTERVAL HISTORY: Leslie Williamson 82 y.o. female returns for  regular  visit for followup of JAK2+ myeloproliferative disorder presenting with thrombocytosis, thought to be secondary to splenectomy in the past with JAK2 testing in 2014 being negative.  HOWEVER, thrombocytosis progressed and repeat JAK2 is positive for V617F mutation on 01/22/2015. AND transformation from a low-grade lymphoma to large cell high grade B-cell lymphoma treated with 6 cycles of R.-CHOP ending  on 09/08/1998 resulting in a complete remission. AND   intermittent iron deficiency for which she received intravenous Feraheme.   Leslie Williamson is unaccompanied. I personally reviewed and went over laboratory studies with the patient.  She is taking the Hydrea Mondays through Fridays. She does not take it on the weekend.  The other morning she had a cold and had a little nausea but she saw her PCP who prescribed her medication and this has resolved.  She denies any current stomach pain or nausea.  She had an X-ray performed on her right arm/shoulder because she has been having trouble with it.  She sees her PCP, Dr. Legrand Rams regularly and states that everything is normal.  She plans on having cataract surgery performed sometime in September.  I spoke with the patient about having a screening mammogram performed. She does not want to undergo a mammograms any longer. She denies any issues with her breasts. Last mammogram was in 2015    Past Medical History:  Diagnosis Date  . Anxiety   . Depression   . Hypercholesteremia   . Hypertension   . Hypokalemia   . Iron deficiency anemia   . Left hip pain   . NHL (non-Hodgkin's lymphoma) ([place]) 12/17/2010  . Obesity   . Primary thrombocytosis ([place]) 01/11/2006   Secondary to splenectomy    . PVD (peripheral vascular disease) ([place])   .  S/P splenectomy 01/01/2014  . 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 Vertigo, labyrinthine     has HYPERLIPIDEMIA; Iron deficiency anemia; JAK2+ MDP (myeloproliferative disorder) presenting with thrombocytosis ([place]); ANXIETY; CARPAL TUNNEL SYNDROME; HTN; PVD- know Lt SFA disease, bilat PT and AT disease; ALLERGIC RHINITIS; GERD; SEBORRHEIC KERATOSIS; WEIGHT LOSS, ABNORMAL; COLONIC POLYPS, ADENOMATOUS, HX OF; NHL (non-Hodgkin's lymphoma) ([place]); Claudication of both lower extremities ([place]); and S/P splenectomy on her problem list.      is allergic to penicillins.  Leslie Williamson does not currently have medications on file.  Past Surgical History:  Procedure Laterality Date  . PORT-A-CATH REMOVAL      Denies any headaches, dizziness, double vision, fevers, chills, night sweats, nausea, vomiting, diarrhea, constipation, chest pain, heart palpitations, shortness of breath, blood in stool, black tarry stool, urinary pain, urinary burning, urinary frequency, hematuria. 14 point review of systems was performed and is negative except as detailed under history of present illness and above   PHYSICAL EXAMINATION  ECOG PERFORMANCE STATUS: 0 - Asymptomatic  Vitals:   09/23/15 1100  BP: 104/60  Pulse: (!) 101  Resp: 18  Temp: 98 F (36.7 C)    GENERAL:alert, no distress, well nourished, well developed, comfortable, cooperative and smiling SKIN: skin color, texture, turgor are normal,  HEAD: Normocephalic, No masses, lesions, tenderness or abnormalities EYES: EOMI, Conjunctiva are pink and non-injected EARS: External ears normal OROPHARYNX:mucous membranes are moist  NECK: supple, trachea midline LYMPH:  No cervical adenopathy noted. BREAST:no discrete palpable abnormality in either breast. No skin changes. No axillary adenopathy LUNGS: Clear to auscultation without any wheezes, rales, or rhonchi. HEART: Regular rhythm and rate without murmur, rub, or gallop.  ABDOMEN: Nontender and soft. BACK: not examined EXTREMITIES: No lower extremity edema.  NEURO: alert & oriented x 3 with fluent speech, no focal motor/sensory deficits, gait normal   LABORATORY DATA: I have reviewed the data as listed.  Results for Leslie Williamson, Leslie Williamson (MRN [id number])   Ref. Range 08/27/2015 12:10  Sodium Latest Ref Range: 135 - 145 mmol/L 139  Potassium Latest Ref Range: 3.5 - 5.1 mmol/L 3.5  Chloride Latest Ref Range: 101 - 111 mmol/L 109  CO2 Latest Ref Range: 22 - 32 mmol/L 24  BUN Latest Ref Range: 6 - 20 mg/dL 12  Creatinine Latest Ref  Range: 0.44 - 1.00 mg/dL 0.93  Calcium Latest Ref Range: 8.9 - 10.3 mg/dL 8.8 (L)  EGFR (Non-African Amer.) Latest Ref Range: >60 mL/min 56 (L)  EGFR (African American) Latest Ref Range: >60 mL/min >60  Glucose Latest Ref Range: 65 - 99 mg/dL 98  Anion gap Latest Ref Range: 5 - 15  6  Alkaline Phosphatase Latest Ref Range: 38 - 126 U/L 64  Albumin Latest Ref Range: 3.5 - 5.0 g/dL 3.7  AST Latest Ref Range: 15 - 41 U/L 17  ALT Latest Ref Range: 14 - 54 U/L 16  Total Protein Latest Ref Range: 6.5 - 8.1 g/dL 7.1  Total Bilirubin Latest Ref Range: 0.3 - 1.2 mg/dL 0.5  WBC Latest Ref Range: 4.0 - 10.5 K/uL 8.3  RBC Latest Ref Range: 3.87 - 5.11 MIL/uL 3.63 (L)  Hemoglobin Latest Ref Range: 12.0 - 15.0 g/dL 12.5  HCT Latest Ref Range: 36.0 - 46.0 % 37.4  MCV Latest Ref Range: 78.0 - 100.0 fL 103.0 (H)  MCH Latest Ref Range: 26.0 - 34.0 pg 34.4 (H)  MCHC Latest Ref  Range: 30.0 - 36.0 g/dL 33.4  RDW Latest Ref Range: 11.5 - 15.5 % 17.1 (H)  Platelets Latest Ref Range: 150 - 400 K/uL 469 (H)  Neutrophils Latest Units: % 43  Lymphocytes Latest Units: % 47  Monocytes Relative Latest Units: % 9  Eosinophil Latest Units: % 1  Basophil Latest Units: % 0  NEUT# Latest Ref Range: 1.7 - 7.7 K/uL 3.5  Lymphocyte # Latest Ref Range: 0.7 - 4.0 K/uL 3.9  Monocyte # Latest Ref Range: 0.1 - 1.0 K/uL 0.8  Eosinophils Absolute Latest Ref Range: 0.0 - 0.7 K/uL 0.1  Basophils Absolute Latest Ref Range: 0.0 - 0.1 K/uL 0.0    Lab Results  Component Value Date   IRON 58 06/30/2015   TIBC 402 06/30/2015   FERRITIN 13 06/30/2015   JAK2 GenotypR Comment (A)   Comments: (NOTE)  Result: POSITIVE for the detection of the V617F mutation.             ASSESSMENT/PLAN:  Diffuse large B-cell lymphoma, unspecified body region ([place]) - Plan: CBC with Differential, CBC with Differential JAK 2 positive MPD Abnormal mammogram L breast 2015 Macrocytosis secondary to hydrea Iron deficiency anemia  She states  she is taking her hydrea 5 times weekly, I have explained to her to take her hydrea M - Sat once daily.  We will check labs every 2 weeks and gradually increase her hydrea dose as needed; goal platelet count is less than 450K.  I have encouraged her to go and get her repeat mammography. She notes that she will think on this. The orders are in, she just needs to go down to radiology. I discussed with her that her last mammogram had an area of abnormality in the L breast. This would be something that cannot be felt.  THERAPY PLAN:  Continue Hydrea.  We will keep a close eye on her lab results and monitor for response.  Will adjust dose according to lab results.    She is to continue with surveillance for NHL as well. NCCN guidelines recommends the follow surveillance for Stage I-IV NHL for those who attain a complete response to therapy:  A. H&P every 3-6 months for 5 years, then yearly or as clinically indicated.  B. Labs every 3-6 months for 5 years and then annually or as clinically indicated.  C. Repeat CT scans only as clinically indicated.   She will return for follow up in 2 months.  All questions were answered. The patient knows to call the clinic with any problems, questions or concerns. We can certainly see the patient much sooner if necessary.  This document serves as a record of services personally performed by Ancil Linsey, MD. It was created on her behalf by Arlyce Harman, a trained medical scribe. The creation of this record is based on the scribe's personal observations and the provider's statements to them. This document has been checked and approved by the attending provider.  I have reviewed the above documentation for accuracy and completeness, and I agree with the above.   Molli Hazard, MD  09/23/2015 11:10 AM

## 2015-09-25 ENCOUNTER — Encounter: Payer: Self-pay | Admitting: Internal Medicine

## 2015-10-03 ENCOUNTER — Other Ambulatory Visit (HOSPITAL_COMMUNITY): Payer: Self-pay | Admitting: Oncology

## 2015-10-03 DIAGNOSIS — E876 Hypokalemia: Secondary | ICD-10-CM

## 2015-10-14 ENCOUNTER — Encounter: Payer: Self-pay | Admitting: Orthopedic Surgery

## 2015-10-14 ENCOUNTER — Ambulatory Visit (INDEPENDENT_AMBULATORY_CARE_PROVIDER_SITE_OTHER): Payer: Medicare Other | Admitting: Orthopedic Surgery

## 2015-10-14 VITALS — BP 125/77 | HR 107 | Ht 60.5 in | Wt 162.0 lb

## 2015-10-14 DIAGNOSIS — M75101 Unspecified rotator cuff tear or rupture of right shoulder, not specified as traumatic: Secondary | ICD-10-CM | POA: Diagnosis not present

## 2015-10-14 DIAGNOSIS — M129 Arthropathy, unspecified: Secondary | ICD-10-CM

## 2015-10-14 DIAGNOSIS — M19011 Primary osteoarthritis, right shoulder: Secondary | ICD-10-CM

## 2015-10-14 MED ORDER — DICLOFENAC POTASSIUM 50 MG PO TABS: 50.0000 mg | ORAL_TABLET | Freq: Two times a day (BID) | ORAL | 2 refills | Status: DC

## 2015-10-14 NOTE — Patient Instructions (Addendum)
You have received an injection of steroids into the joint. 15% of patients will have increased pain within the 24 hours postinjection.   This is transient and will go away.   We recommend that you use ice packs on the injection site for 20 minutes every 2 hours and extra strength Tylenol 2 tablets every 8 as needed until the pain resolves.  If you continue to have pain after taking the Tylenol and using the ice please call the office for further instructions.  Call therapy department start therapy on right shoulder [phone]  Start new medication

## 2015-10-14 NOTE — Progress Notes (Signed)
Chief Complaint  Patient presents with  . Shoulder Pain    Right shoulder/arm pain   HPI  Two-month history right shoulder severe pain constant throbbing worsened night so she catching and weakness in the right arm. Unrelieved by Tylenol. Pain rated 8 out of 10  Review of Systems  Musculoskeletal: Positive for joint pain and myalgias.  Neurological: Positive for tingling.  All other systems reviewed and are negative.   Past Medical History:  Diagnosis Date  . Anxiety   . Depression   . Hypercholesteremia   . Hypertension   . Hypokalemia   . Iron deficiency anemia   . Left hip pain   . NHL (non-Hodgkin's lymphoma) ([place]) 12/17/2010  . Obesity   . Primary thrombocytosis ([place]) 01/11/2006   Secondary to splenectomy    . PVD (peripheral vascular disease) ([place])   . S/P splenectomy 01/01/2014  . 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 Vertigo, labyrinthine     Past Surgical History:  Procedure Laterality Date  . PORT-A-CATH REMOVAL     Family History  Problem Relation Age of Onset  . Diabetes Mother    Social History  Substance Use Topics  . Smoking status: Former Research scientist (life sciences)  . Smokeless tobacco: Never Used  . Alcohol use No    Current Outpatient Prescriptions:  .  ALPRAZolam (XANAX) 0.5 MG tablet, Take 1 tablet (0.5 mg total) by mouth at bedtime as needed for sleep or anxiety., Disp: 30 tablet, Rfl: 2 .  amLODipine (NORVASC) 2.5 MG tablet, Take 2.5 mg by mouth daily., Disp: , Rfl: 2 .  aspirin 81 MG tablet, Take 81 mg by mouth daily., Disp: , Rfl:  .  atorvastatin (LIPITOR) 40 MG tablet, Take 40 mg by mouth daily., Disp: , Rfl: 2 .  cilostazol (PLETAL) 100 MG tablet, Take 100 mg by mouth 2 (two) times daily., Disp: , Rfl:  .  hydroxyurea (HYDREA) 500 MG capsule, TAKE 1 CAPSULE BY MOUTH ON MONDAY -FRIDAY. MAY TAKE WITH FOOD TO MINIMIZE GI SIDE EFFECTS., Disp: 30 capsule, Rfl: 0 .  ibuprofen  (ADVIL,MOTRIN) 800 MG tablet, , Disp: , Rfl: 0 .  loratadine (CLARITIN) 10 MG tablet, Take 10 mg by mouth daily as needed for allergies. , Disp: , Rfl:  .  losartan-hydrochlorothiazide (HYZAAR) 100-12.5 MG per tablet, Take 1 tablet by mouth daily. Reported on 06/30/2015, Disp: , Rfl:  .  meclizine (ANTIVERT) 25 MG tablet, Take 25 mg by mouth as needed. Reported on 06/30/2015, Disp: , Rfl:  .  potassium chloride SA (K-DUR,KLOR-CON) 20 MEQ tablet, TAKE ONE TABLET BY MOUTH DAILY., Disp: 30 tablet, Rfl: 0 .  diclofenac (CATAFLAM) 50 MG tablet, Take 1 tablet (50 mg total) by mouth 2 (two) times daily., Disp: 60 tablet, Rfl: 2  BP 125/77   Pulse (!) 107   Ht 5' 0.5" (1.537 m)   Wt 162 lb (73.5 kg)   BMI 31.12 kg/m   Physical Exam  Constitutional: She is oriented to person, place, and time. She appears well-developed and well-nourished. No distress.  Cardiovascular: Normal rate and intact distal pulses.   Lymphadenopathy:    She has cervical adenopathy.       Right cervical: No superficial cervical adenopathy present.      Left cervical: No superficial cervical adenopathy present.  Neurological: She is alert and oriented to person, place, and time. She has normal reflexes. She exhibits normal muscle tone. Coordination  normal.  Skin: Skin is warm and dry. No rash noted. She is not diaphoretic. No erythema. No pallor.  Psychiatric: She has a normal mood and affect. Her behavior is normal. Judgment and thought content normal.    Ortho Exam Left shoulder full range of motion. No weakness. Shoulder stable to stress testing. No tenderness or deformity. Normal sensation  Right shoulder active range of motion limited to 90 passive 150 drop test negative shoulder stable to stress testing moderate tenderness around the anterior joint line.  ASSESSMENT: My personal interpretation of the images:  Imaging shows mild glenohumeral arthritis and mild tuberosity chronic changes sclerosis and cyst  formation   Encounter Diagnoses  Name Primary?  Marland Kitchen Arthritis of right shoulder region Yes  . Rotator cuff syndrome of right shoulder     PLAN  Procedure note the subacromial injection shoulder RIGHT  Verbal consent was obtained to inject the  RIGHT   Shoulder  Timeout was completed to confirm the injection site is a subacromial space of the  RIGHT  shoulder   Medication used Depo-Medrol 40 mg and lidocaine 1% 3 cc  Anesthesia was provided by ethyl chloride  The injection was performed in the RIGHT  posterior subacromial space. After pinning the skin with alcohol and anesthetized the skin with ethyl chloride the subacromial space was injected using a 20-gauge needle. There were no complications  Sterile dressing was applied.  Start diclofenac 50 mg twice a day  Occupational therapy  3 month follow-up Arther Abbott, MD 10/14/2015 10:48 AM

## 2015-10-16 ENCOUNTER — Ambulatory Visit (HOSPITAL_COMMUNITY): Payer: Medicare Other | Attending: Orthopedic Surgery | Admitting: Occupational Therapy

## 2015-10-16 ENCOUNTER — Encounter (HOSPITAL_COMMUNITY): Payer: Self-pay | Admitting: Occupational Therapy

## 2015-10-16 DIAGNOSIS — R29898 Other symptoms and signs involving the musculoskeletal system: Secondary | ICD-10-CM | POA: Diagnosis not present

## 2015-10-16 DIAGNOSIS — M25511 Pain in right shoulder: Secondary | ICD-10-CM | POA: Diagnosis not present

## 2015-10-16 NOTE — Patient Instructions (Signed)
Repeat all exercises 10-15 times, 1-2 times per day.   1) Shoulder Protraction    Begin with elbows by your side, slowly "punch" straight out in front of you keeping arms/elbows straight.      2) Shoulder Flexion  Supine:     Standing:         Begin with arms at your side with thumbs pointed up, slowly raise both arms up and forward towards overhead.               3) Horizontal abduction/adduction  Supine:   Standing:           Begin with arms straight out in front of you, bring out to the side in at "T" shape. Keep arms straight entire time.                 4) Internal & External Rotation    *No band* -Stand with elbows at the side and elbows bent 90 degrees. Move your forearms away from your body, then bring back inward toward the body.     5) Shoulder Abduction  Supine:     Standing:       Lying on your back begin with your arms flat on the table next to your side. Slowly move your arms out to the side so that they go overhead, in a jumping jack or snow angel movement.       1) Flexion Wall Stretch    Face wall, place affected handon wall in front of you. Slide hand up the wall  and lean body in towards the wall. Hold for 5 seconds. Repeat 10 times. 1-2 times/day.     2) Towel Stretch with Internal Rotation   Gently pull up your affected arm  behind your back with the assist of a towel            3) Corner Stretch    Stand at a corner of a wall, place your arms on the walls with elbows bent. Lean into the corner until a stretch is felt along the front of your chest and/or shoulders. Hold for 5 seconds. Repeat 10X, 1-2 times/day.    4) Posterior Capsule Stretch    Bring the involved arm across chest. Grasp elbow and pull toward chest until you feel a stretch in the back of the upper arm and shoulder. Hold 5 seconds. Repeat 10X. Complete 1-2 times/day.    5) Scapular Retraction    Tuck chin back as  you pinch shoulder blades together.  Hold 5 seconds. Repeat 10X. Complete 1-2 times/day.

## 2015-10-16 NOTE — Therapy (Signed)
[place], [place], [address] Phone: [phone]   Fax:  [phone]  Occupational Therapy Evaluation  Patient Details  Name: Leslie Williamson MRN: [id number] Date of Birth: [birth year] Referring Provider: Dr. Arther Abbott  Encounter Date: 10/16/2015      OT End of Session - 10/16/15 1554    Visit Number 1   Number of Visits 1   Date for OT Re-Evaluation 10/17/15   Authorization Type [employer] Medicare   Authorization Time Period Before 10th visit   Authorization - Visit Number 1   Authorization - Number of Visits 10   OT Start Time 1520   OT Stop Time 1545   OT Time Calculation (min) 25 min   Activity Tolerance Patient tolerated treatment well   Behavior During Therapy [facility] for tasks assessed/performed      Past Medical History:  Diagnosis Date  . Anxiety   . Depression   . Hypercholesteremia   . Hypertension   . Hypokalemia   . Iron deficiency anemia   . Left hip pain   . NHL (non-Hodgkin's lymphoma) ([place]) 12/17/2010  . Obesity   . Primary thrombocytosis ([place]) 01/11/2006   Secondary to splenectomy    . PVD (peripheral vascular disease) ([place])   . S/P splenectomy 01/01/2014  . 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 Vertigo, labyrinthine     Past Surgical History:  Procedure Laterality Date  . PORT-A-CATH REMOVAL      There were no vitals filed for this visit.      Subjective Assessment - 10/16/15 1551    Subjective  S: I haven't had any pain since that shot he gave me.    Pertinent History Pt is an 82 y/o female presenting with right rotator cuff syndrome that began approxmately 2-3 months ago. Pt had cortisone shot last week and was prescribed medication, reports pain is decreased significantly and she is now able to complete functional tasks. Dr. Arther Abbott referred pt to occupational therapy for evaluation  and treatment.    Special Tests FOTO Score: 53/100 (47% impairment)   Patient Stated Goals To use my right arm without pain.    Currently in Pain? No/denies           [facility] OT Assessment - 10/16/15 1518      Assessment   Diagnosis Rotator Cuff Syndrome   Referring Provider Dr. Arther Abbott   Onset Date 07/16/15  over 2 months   Prior Therapy None     Precautions   Precautions None     Restrictions   Weight Bearing Restrictions No     Balance Screen   Has the patient fallen in the past 6 months No   Has the patient had a decrease in activity level because of a fear of falling?  No   Is the patient reluctant to leave their home because of a fear of falling?  No     Home  Environment   Family/patient expects to be discharged to: Private residence   Lives With Other (Comment)  sister     Prior Function   Level of Independence Independent with basic ADLs   Vocation Retired   Leisure puzzle, books, tv     ADL   ADL comments Pt is having difficulty with bathing, grooming-washing/fixing hair, reaching up to and past shoulder level, lifting lightweight objects     Written  Expression   Dominant Hand Right     Cognition   Overall Cognitive Status Within Functional Limits for tasks assessed     ROM / Strength   AROM / PROM / Strength AROM;PROM;Strength     Palpation   Palpation comment Min fascial restrictions in right upper arm and trapezius regions     AROM   Overall AROM Comments Assessed seated, ER/IR adducted   AROM Assessment Site Shoulder   Right/Left Shoulder Right   Right Shoulder Flexion 160 Degrees   Right Shoulder ABduction 128 Degrees   Right Shoulder Internal Rotation 90 Degrees   Right Shoulder External Rotation 85 Degrees     PROM   Overall PROM  Within functional limits for tasks performed   Overall PROM Comments Assessed supine, ER/IR adducted   PROM Assessment Site Shoulder   Right/Left Shoulder Right     Strength   Overall Strength  Comments Assessed seated, ER/IR adducted   Strength Assessment Site Shoulder   Right/Left Shoulder Right   Right Shoulder Flexion 4/5   Right Shoulder ABduction 4-/5   Right Shoulder Internal Rotation 4/5   Right Shoulder External Rotation 4/5                         OT Education - 24-Oct-2015 1554    Education provided Yes   Education Details A/ROM and shoulder stretches; educated on heat/ice for pain management   Person(s) Educated Patient   Methods Explanation;Demonstration;Handout   Comprehension Verbalized understanding;Returned demonstration          OT Short Term Goals - 10/24/2015 1558      OT SHORT TERM GOAL #1   Title Pt will be provided with and educated on HEP.    Time 1   Period Days   Status Achieved                  Plan - 10-24-15 1555    Clinical Impression Statement A: Pt is an 82 y/o female presenting with increased pain in right shoulder, as well as decreased range of motion and strength limiting functional use of RUE. Pt reports drastic improvement since cortisone shot and beginning medication 2 days ago, requests HEP versus scheduling therapy visits due to high copay. On observation pt has functional use of the RUE eqiuvalent to LUE. Provided A/ROM and shoulder stretching HEP, pt demonstrates correct form and verbalizes understanding.    Rehab Potential Good   OT Frequency One time visit   OT Treatment/Interventions Therapeutic exercise;Patient/family education   Plan P: Pt provided with HEP for A/ROM and shoulder stretches. No visits scheduled due to pt being unable to afford to consistently pay high copay.    OT Home Exercise Plan A/ROM, shoulder stretches   Consulted and Agree with Plan of Care Patient      Patient will benefit from skilled therapeutic intervention in order to improve the following deficits and impairments:  Impaired UE functional use  Visit Diagnosis: Pain in right shoulder  Other symptoms and signs  involving the musculoskeletal system      G-Codes - 24-Oct-2015 1559    Functional Assessment Tool Used FOTO Score: 53/100 (47% impairment)   Functional Limitation Carrying, moving and handling objects   Carrying, Moving and Handling Objects Goal Status [id number]) At least 40 percent but less than 60 percent impaired, limited or restricted   Carrying, Moving and Handling Objects Discharge Status [id number]) At least 40 percent but less than 60 percent  impaired, limited or restricted      Problem List Patient Active Problem List   Diagnosis Date Noted  . S/P splenectomy 01/01/2014  . Claudication of both lower extremities ([place]) 11/06/2013  . NHL (non-Hodgkin's lymphoma) ([place]) 12/17/2010  . WEIGHT LOSS, ABNORMAL 02/05/2009  . COLONIC POLYPS, ADENOMATOUS, HX OF 02/05/2009  . GERD 08/08/2008  . Iron deficiency anemia 06/12/2008  . ANXIETY 03/20/2007  . HYPERLIPIDEMIA 01/11/2006  . JAK2+ MDP (myeloproliferative disorder) presenting with thrombocytosis ([place]) 01/11/2006  . CARPAL TUNNEL SYNDROME 01/11/2006  . HTN 01/11/2006  . PVD- know Lt SFA disease, bilat PT and AT disease 01/11/2006  . ALLERGIC RHINITIS 01/11/2006  . SEBORRHEIC KERATOSIS 01/11/2006   Guadelupe Sabin, OTR/L  [phone] 10/16/2015, 4:00 PM  [facility] [address] [place], [place], [address] Phone: [phone]   Fax:  [phone]  Name: Leslie Williamson MRN: [id number] Date of Birth: 02-09-1932

## 2015-10-17 DIAGNOSIS — H25812 Combined forms of age-related cataract, left eye: Secondary | ICD-10-CM | POA: Diagnosis not present

## 2015-10-19 ENCOUNTER — Encounter (HOSPITAL_COMMUNITY): Payer: Self-pay | Admitting: Hematology & Oncology

## 2015-10-21 ENCOUNTER — Encounter (HOSPITAL_COMMUNITY): Payer: Medicare Other

## 2015-10-21 DIAGNOSIS — C8590 Non-Hodgkin lymphoma, unspecified, unspecified site: Secondary | ICD-10-CM | POA: Diagnosis not present

## 2015-10-21 DIAGNOSIS — C8330 Diffuse large B-cell lymphoma, unspecified site: Secondary | ICD-10-CM

## 2015-10-21 DIAGNOSIS — D509 Iron deficiency anemia, unspecified: Secondary | ICD-10-CM

## 2015-10-21 DIAGNOSIS — D471 Chronic myeloproliferative disease: Secondary | ICD-10-CM

## 2015-10-21 LAB — CBC WITH DIFFERENTIAL/PLATELET
Basophils Absolute: 0 K/uL (ref 0.0–0.1)
Basophils Relative: 0 %
Eosinophils Absolute: 0.1 K/uL (ref 0.0–0.7)
Eosinophils Relative: 1 %
HCT: 37.2 % (ref 36.0–46.0)
Hemoglobin: 12.1 g/dL (ref 12.0–15.0)
Lymphocytes Relative: 38 %
Lymphs Abs: 2.9 K/uL (ref 0.7–4.0)
MCH: 33.3 pg (ref 26.0–34.0)
MCHC: 32.5 g/dL (ref 30.0–36.0)
MCV: 102.5 fL — ABNORMAL HIGH (ref 78.0–100.0)
Monocytes Absolute: 0.7 K/uL (ref 0.1–1.0)
Monocytes Relative: 9 %
Neutro Abs: 4 K/uL (ref 1.7–7.7)
Neutrophils Relative %: 52 %
Platelets: 539 K/uL — ABNORMAL HIGH (ref 150–400)
RBC: 3.63 MIL/uL — ABNORMAL LOW (ref 3.87–5.11)
RDW: 16 % — ABNORMAL HIGH (ref 11.5–15.5)
WBC: 7.6 K/uL (ref 4.0–10.5)

## 2015-10-21 LAB — COMPREHENSIVE METABOLIC PANEL WITH GFR
ALT: 15 U/L (ref 14–54)
AST: 19 U/L (ref 15–41)
Albumin: 3.6 g/dL (ref 3.5–5.0)
Alkaline Phosphatase: 59 U/L (ref 38–126)
Anion gap: 2 — ABNORMAL LOW (ref 5–15)
BUN: 27 mg/dL — ABNORMAL HIGH (ref 6–20)
CO2: 23 mmol/L (ref 22–32)
Calcium: 8.6 mg/dL — ABNORMAL LOW (ref 8.9–10.3)
Chloride: 111 mmol/L (ref 101–111)
Creatinine, Ser: 1.1 mg/dL — ABNORMAL HIGH (ref 0.44–1.00)
GFR calc Af Amer: 53 mL/min — ABNORMAL LOW
GFR calc non Af Amer: 45 mL/min — ABNORMAL LOW
Glucose, Bld: 141 mg/dL — ABNORMAL HIGH (ref 65–99)
Potassium: 4.2 mmol/L (ref 3.5–5.1)
Sodium: 136 mmol/L (ref 135–145)
Total Bilirubin: 0.6 mg/dL (ref 0.3–1.2)
Total Protein: 6.7 g/dL (ref 6.5–8.1)

## 2015-10-21 LAB — FERRITIN: Ferritin: 14 ng/mL (ref 11–307)

## 2015-10-21 LAB — LACTATE DEHYDROGENASE: LDH: 160 U/L (ref 98–192)

## 2015-10-21 LAB — C-REACTIVE PROTEIN

## 2015-10-21 LAB — IRON AND TIBC
Iron: 67 ug/dL (ref 28–170)
Saturation Ratios: 17 % (ref 10.4–31.8)
TIBC: 388 ug/dL (ref 250–450)
UIBC: 321 ug/dL

## 2015-10-21 LAB — SEDIMENTATION RATE: SED RATE: 25 mm/h — AB (ref 0–22)

## 2015-10-22 LAB — BETA 2 MICROGLOBULIN, SERUM: Beta-2 Microglobulin: 1.7 mg/L (ref 0.6–2.4)

## 2015-10-24 NOTE — Patient Instructions (Signed)
Leslie Williamson  10/24/2015     @PREFPERIOPPHARMACY @   Your procedure is scheduled on 11/03/2015.  Report to [facility] at 9:30 A.M.  Call this number if you have problems the morning of surgery:  [phone]   Remember:  Do not eat food or drink liquids after midnight.  Take these medicines the morning of surgery with A SIP OF WATER : Xanax, Norvasc, Claritin, Hyzaar and K Dur   Do not wear jewelry, make-up or nail polish.  Do not wear lotions, powders, or perfumes, or deoderant.  Do not shave 48 hours prior to surgery.  Men may shave face and neck.  Do not bring valuables to the hospital.  [facility] is not responsible for any belongings or valuables.  Contacts, dentures or bridgework may not be worn into surgery.  Leave your suitcase in the car.  After surgery it may be brought to your room.  For patients admitted to the hospital, discharge time will be determined by your treatment team.  Patients discharged the day of surgery will not be allowed to drive home.   Name and phone number of your driver:   family Special instructions:  n/a  Please read over the following fact sheets that you were given. Care and Recovery After Surgery     A cataract is a clouding of the lens of the eye. When a lens becomes cloudy, vision is reduced based on the degree and nature of the clouding. Surgery may be needed to improve vision. Surgery removes the cloudy lens and usually replaces it with a substitute lens (intraocular lens, IOL). LET YOUR EYE DOCTOR KNOW ABOUT:  Allergies to food or medicine.  Medicines taken including herbs, eye drops, over-the-counter medicines, and creams.  Use of steroids (by mouth or creams).  Previous problems with anesthetics or numbing medicine.  History of bleeding problems or blood clots.  Previous surgery.  Other health problems, including diabetes and kidney problems.  Possibility of pregnancy, if this applies. RISKS AND  COMPLICATIONS  Infection.  Inflammation of the eyeball (endophthalmitis) that can spread to both eyes (sympathetic ophthalmia).  Poor wound healing.  If an IOL is inserted, it can later fall out of proper position. This is very uncommon.  Clouding of the part of your eye that holds an IOL in place. This is called an "after-cataract." These are uncommon but easily treated. BEFORE THE PROCEDURE  Do not eat or drink anything except small amounts of water for 8 to 12 before your surgery, or as directed by your caregiver.  Unless you are told otherwise, continue any eye drops you have been prescribed.  Talk to your primary caregiver about all other medicines that you take (both prescription and nonprescription). In some cases, you may need to stop or change medicines near the time of your surgery. This is most important if you are taking blood-thinning medicine.Do not stop medicines unless you are told to do so.  Arrange for someone to drive you to and from the procedure.  Do not put contact lenses in either eye on the day of your surgery. PROCEDURE There is more than one method for safely removing a cataract. Your doctor can explain the differences and help determine which is best for you. Phacoemulsification surgery is the most common form of cataract surgery.  An injection is given behind the eye or eye drops are given to make this a painless procedure.  A small cut (incision) is made on the edge of the  clear, dome-shaped surface that covers the front of the eye (cornea).  A tiny probe is painlessly inserted into the eye. This device gives off ultrasound waves that soften and break up the cloudy center of the lens. This makes it easier for the cloudy lens to be removed by suction.  An IOL may be implanted.  The normal lens of the eye is covered by a clear capsule. Part of that capsule is intentionally left in the eye to support the IOL.  Your surgeon may or may not use stitches to  close the incision. There are other forms of cataract surgery that require a larger incision and stitches to close the eye. This approach is taken in cases where the doctor feels that the cataract cannot be easily removed using phacoemulsification. AFTER THE PROCEDURE  When an IOL is implanted, it does not need care. It becomes a permanent part of your eye and cannot be seen or felt.  Your doctor will schedule follow-up exams to check on your progress.  Review your other medicines with your doctor to see which can be resumed after surgery.  Use eye drops or take medicine as prescribed by your doctor.   This information is not intended to replace advice given to you by your health care provider. Make sure you discuss any questions you have with your health care provider.   Document Released: 01/28/2011 Document Revised: 03/01/2014 Document Reviewed: 01/28/2011 Elsevier Interactive Patient Education [employer].

## 2015-10-25 ENCOUNTER — Other Ambulatory Visit: Payer: Self-pay

## 2015-10-25 ENCOUNTER — Emergency Department (HOSPITAL_COMMUNITY): Payer: Medicare Other

## 2015-10-25 ENCOUNTER — Encounter (HOSPITAL_COMMUNITY): Payer: Self-pay | Admitting: Emergency Medicine

## 2015-10-25 ENCOUNTER — Observation Stay (HOSPITAL_COMMUNITY)
Admission: EM | Admit: 2015-10-25 | Discharge: 2015-10-26 | Disposition: A | Discharge status: Home / Self Care | Payer: Medicare Other | Attending: Internal Medicine | Admitting: Internal Medicine

## 2015-10-25 DIAGNOSIS — I10 Essential (primary) hypertension: Secondary | ICD-10-CM | POA: Diagnosis not present

## 2015-10-25 DIAGNOSIS — R0602 Shortness of breath: Secondary | ICD-10-CM | POA: Diagnosis not present

## 2015-10-25 DIAGNOSIS — H8109 Meniere's disease, unspecified ear: Secondary | ICD-10-CM | POA: Diagnosis present

## 2015-10-25 DIAGNOSIS — R079 Chest pain, unspecified: Secondary | ICD-10-CM | POA: Diagnosis not present

## 2015-10-25 DIAGNOSIS — Z7982 Long term (current) use of aspirin: Secondary | ICD-10-CM | POA: Insufficient documentation

## 2015-10-25 DIAGNOSIS — Z79899 Other long term (current) drug therapy: Secondary | ICD-10-CM | POA: Insufficient documentation

## 2015-10-25 DIAGNOSIS — Z87891 Personal history of nicotine dependence: Secondary | ICD-10-CM | POA: Diagnosis not present

## 2015-10-25 DIAGNOSIS — R7989 Other specified abnormal findings of blood chemistry: Secondary | ICD-10-CM | POA: Diagnosis not present

## 2015-10-25 DIAGNOSIS — C8590 Non-Hodgkin lymphoma, unspecified, unspecified site: Secondary | ICD-10-CM | POA: Diagnosis present

## 2015-10-25 DIAGNOSIS — R778 Other specified abnormalities of plasma proteins: Secondary | ICD-10-CM

## 2015-10-25 DIAGNOSIS — R42 Dizziness and giddiness: Principal | ICD-10-CM | POA: Insufficient documentation

## 2015-10-25 LAB — URINE MICROSCOPIC-ADD ON

## 2015-10-25 LAB — CBC WITH DIFFERENTIAL/PLATELET
BASOS PCT: 0 %
Basophils Absolute: 0 10*3/uL (ref 0.0–0.1)
EOS ABS: 0.1 10*3/uL (ref 0.0–0.7)
Eosinophils Relative: 1 %
HCT: 39.8 % (ref 36.0–46.0)
HEMOGLOBIN: 13.2 g/dL (ref 12.0–15.0)
Lymphocytes Relative: 39 %
Lymphs Abs: 3.9 10*3/uL (ref 0.7–4.0)
MCH: 33.9 pg (ref 26.0–34.0)
MCHC: 33.2 g/dL (ref 30.0–36.0)
MCV: 102.3 fL — ABNORMAL HIGH (ref 78.0–100.0)
MONOS PCT: 9 %
Monocytes Absolute: 0.9 10*3/uL (ref 0.1–1.0)
NEUTROS PCT: 51 %
Neutro Abs: 5.1 10*3/uL (ref 1.7–7.7)
Platelets: 529 10*3/uL — ABNORMAL HIGH (ref 150–400)
RBC: 3.89 MIL/uL (ref 3.87–5.11)
RDW: 16 % — AB (ref 11.5–15.5)
WBC: 10.1 10*3/uL (ref 4.0–10.5)

## 2015-10-25 LAB — COMPREHENSIVE METABOLIC PANEL
ALT: 14 U/L (ref 14–54)
ANION GAP: 10 (ref 5–15)
AST: 17 U/L (ref 15–41)
Albumin: 4.2 g/dL (ref 3.5–5.0)
Alkaline Phosphatase: 65 U/L (ref 38–126)
BUN: 23 mg/dL — ABNORMAL HIGH (ref 6–20)
CHLORIDE: 107 mmol/L (ref 101–111)
CO2: 22 mmol/L (ref 22–32)
CREATININE: 1.09 mg/dL — AB (ref 0.44–1.00)
Calcium: 9.5 mg/dL (ref 8.9–10.3)
GFR, EST AFRICAN AMERICAN: 53 mL/min — AB (ref >60)
GFR, EST NON AFRICAN AMERICAN: 46 mL/min — AB (ref >60)
Glucose, Bld: 86 mg/dL (ref 65–99)
POTASSIUM: 4 mmol/L (ref 3.5–5.1)
Sodium: 139 mmol/L (ref 135–145)
Total Bilirubin: 0.5 mg/dL (ref 0.3–1.2)
Total Protein: 7.7 g/dL (ref 6.5–8.1)

## 2015-10-25 LAB — URINALYSIS, ROUTINE W REFLEX MICROSCOPIC
BILIRUBIN URINE: NEGATIVE
Glucose, UA: NEGATIVE mg/dL
KETONES UR: NEGATIVE mg/dL
Leukocytes, UA: NEGATIVE
NITRITE: NEGATIVE
Protein, ur: NEGATIVE mg/dL
Specific Gravity, Urine: 1.025 (ref 1.005–1.030)
pH: 5.5 (ref 5.0–8.0)

## 2015-10-25 LAB — TROPONIN I: TROPONIN I: 0.03 ng/mL — AB (ref <0.03)

## 2015-10-25 LAB — LACTIC ACID, PLASMA
LACTIC ACID, VENOUS: 1.4 mmol/L (ref 0.5–1.9)
LACTIC ACID, VENOUS: 1.7 mmol/L (ref 0.5–1.9)

## 2015-10-25 LAB — D-DIMER, QUANTITATIVE (NOT AT ARMC): D DIMER QUANT: 1.02 ug{FEU}/mL — AB (ref 0.00–0.50)

## 2015-10-25 MED ORDER — ASPIRIN 81 MG PO CHEW
324.0000 mg | CHEWABLE_TABLET | Freq: Once | ORAL | Status: AC
  Administered 2015-10-25: 324 mg via ORAL
  Filled 2015-10-25: qty 4

## 2015-10-25 MED ORDER — ACETAMINOPHEN 325 MG PO TABS: 650.0000 mg | ORAL_TABLET | ORAL | Status: DC | PRN

## 2015-10-25 MED ORDER — MECLIZINE HCL 12.5 MG PO TABS
25.0000 mg | ORAL_TABLET | Freq: Three times a day (TID) | ORAL | Status: DC
  Administered 2015-10-25 – 2015-10-26 (×3): 25 mg via ORAL
  Filled 2015-10-25 (×3): qty 2

## 2015-10-25 MED ORDER — MECLIZINE HCL 12.5 MG PO TABS
25.0000 mg | ORAL_TABLET | Freq: Once | ORAL | Status: AC
  Administered 2015-10-25: 25 mg via ORAL
  Filled 2015-10-25: qty 2

## 2015-10-25 MED ORDER — IOPAMIDOL (ISOVUE-370) INJECTION 76%
100.0000 mL | Freq: Once | INTRAVENOUS | Status: AC | PRN
  Administered 2015-10-25: 100 mL via INTRAVENOUS

## 2015-10-25 MED ORDER — ASPIRIN EC 325 MG PO TBEC
325.0000 mg | DELAYED_RELEASE_TABLET | Freq: Every day | ORAL | Status: DC
  Administered 2015-10-26: 325 mg via ORAL
  Filled 2015-10-25: qty 1

## 2015-10-25 MED ORDER — ONDANSETRON HCL 4 MG/2ML IJ SOLN: 4.0000 mg | Freq: Four times a day (QID) | INTRAMUSCULAR | Status: DC | PRN

## 2015-10-25 NOTE — ED Triage Notes (Signed)
Pt states she has been dizzy for past 3 days.

## 2015-10-25 NOTE — ED Provider Notes (Signed)
[facility] DEPT Provider Note   CSN: [id number] Arrival date & time: 10/25/15  1627     History   Chief Complaint Chief Complaint  Patient presents with  . Dizziness    HPI Leslie Williamson is a 82 y.o. female.  HPI  Pt was seen at 1650. Per pt, c/o gradual onset and persistence of multiple intermittent episodes of "dizziness" for the past 3 days. Pt describes the dizziness as "swimmy-headed" and "everything is moving." Worsens with turning head side to side, as well as walking. Pt states when she tries to stand up or walk she "leans to the right." Has been associated with generalized weakness/fatigue. Pt states approximately 1 to 2 weeks ago she experienced an episode of mid-sternal chest "pain" and SOB when walking up a flight of steps; symptoms lasted 20+ minutes before spontaneously resolving. Denies any further episodes of CP/SOB, no palpitations, no abd pain, no N/V/D, no fevers, no visual changes, no focal motor weakness, no tingling/numbness in extremities, no slurred speech, no facial droop.    Past Medical History:  Diagnosis Date  . Anxiety   . Depression   . Hypercholesteremia   . Hypertension   . Hypokalemia   . Iron deficiency anemia   . Left hip pain   . NHL (non-Hodgkin's lymphoma) ([place]) 12/17/2010  . Obesity   . Primary thrombocytosis ([place]) 01/11/2006   Secondary to splenectomy    . PVD (peripheral vascular disease) ([place])   . S/P splenectomy 01/01/2014  . 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 Vertigo, labyrinthine     Patient Active Problem List   Diagnosis Date Noted  . S/P splenectomy 01/01/2014  . Claudication of both lower extremities ([place]) 11/06/2013  . NHL (non-Hodgkin's lymphoma) ([place]) 12/17/2010  . WEIGHT LOSS, ABNORMAL 02/05/2009  . COLONIC POLYPS, ADENOMATOUS, HX OF 02/05/2009  . GERD 08/08/2008  . Iron deficiency anemia 06/12/2008  . ANXIETY 03/20/2007  .  HYPERLIPIDEMIA 01/11/2006  . JAK2+ MDP (myeloproliferative disorder) presenting with thrombocytosis ([place]) 01/11/2006  . CARPAL TUNNEL SYNDROME 01/11/2006  . HTN 01/11/2006  . PVD- know Lt SFA disease, bilat PT and AT disease 01/11/2006  . ALLERGIC RHINITIS 01/11/2006  . SEBORRHEIC KERATOSIS 01/11/2006    Past Surgical History:  Procedure Laterality Date  . PORT-A-CATH REMOVAL         Home Medications    Prior to Admission medications   Medication Sig Start Date End Date Taking? Authorizing Provider  ALPRAZolam Duanne Moron) 0.5 MG tablet Take 1 tablet (0.5 mg total) by mouth at bedtime as needed for sleep or anxiety. 10/06/12   Baird Cancer, PA-C  amLODipine (NORVASC) 2.5 MG tablet Take 2.5 mg by mouth daily. 01/04/15   Historical Provider, MD  aspirin 81 MG tablet Take 81 mg by mouth daily.    Historical Provider, MD  atorvastatin (LIPITOR) 40 MG tablet Take 40 mg by mouth daily. 01/07/15   Historical Provider, MD  cilostazol (PLETAL) 100 MG tablet Take 100 mg by mouth 2 (two) times daily.    Historical Provider, MD  diclofenac (CATAFLAM) 50 MG tablet Take 1 tablet (50 mg total) by mouth 2 (two) times daily. 10/14/15   Carole Civil, MD  hydroxyurea (HYDREA) 500 MG capsule TAKE 1 CAPSULE BY MOUTH ON MONDAY -FRIDAY. MAY TAKE WITH FOOD TO MINIMIZE GI SIDE EFFECTS. 09/18/15   Baird Cancer, PA-C  ibuprofen (ADVIL,MOTRIN) 800 MG tablet  03/10/15  Historical Provider, MD  ketorolac (ACULAR) 0.4 % SOLN  10/15/15   Historical Provider, MD  loratadine (CLARITIN) 10 MG tablet Take 10 mg by mouth daily as needed for allergies.     Historical Provider, MD  losartan-hydrochlorothiazide (HYZAAR) 100-12.5 MG per tablet Take 1 tablet by mouth daily. Reported on 06/30/2015    Historical Provider, MD  meclizine (ANTIVERT) 25 MG tablet Take 25 mg by mouth as needed. Reported on 06/30/2015 09/08/13   Historical Provider, MD  ofloxacin (OCUFLOX) 0.3 % ophthalmic solution  10/13/15   Historical Provider, MD   potassium chloride SA (K-DUR,KLOR-CON) 20 MEQ tablet TAKE ONE TABLET BY MOUTH DAILY. 10/06/15   Baird Cancer, PA-C  prednisoLONE acetate (PRED FORTE) 1 % ophthalmic suspension  10/13/15   Historical Provider, MD    Family History Family History  Problem Relation Age of Onset  . Diabetes Mother     Social History Social History  Substance Use Topics  . Smoking status: Former Research scientist (life sciences)  . Smokeless tobacco: Never Used  . Alcohol use No     Allergies   Penicillins   Review of Systems Review of Systems ROS: Statement: All systems negative except as marked or noted in the HPI; Constitutional: Negative for fever and chills. +generalized weakness/fatigue.; ; Eyes: Negative for eye pain, redness and discharge. ; ; ENMT: Negative for ear pain, hoarseness, nasal congestion, sinus pressure and sore throat. ; ; Cardiovascular: +CP/SOB. Negative for palpitations, diaphoresis, and peripheral edema. ; ; Respiratory: Negative for cough, wheezing and stridor. ; ; Gastrointestinal: Negative for nausea, vomiting, diarrhea, abdominal pain, blood in stool, hematemesis, jaundice and rectal bleeding. . ; ; Genitourinary: Negative for dysuria, flank pain and hematuria. ; ; Musculoskeletal: Negative for back pain and neck pain. Negative for swelling and trauma.; ; Skin: Negative for pruritus, rash, abrasions, blisters, bruising and skin lesion.; ; Neuro: +"dizziness." Negative for headache, lightheadedness and neck stiffness. Negative for altered level of consciousness, altered mental status, extremity weakness, paresthesias, involuntary movement, seizure and syncope.       Physical Exam Updated Vital Signs BP 113/78 (BP Location: Left Arm)   Pulse 92   Temp 98 F (36.7 C)   Resp 18   Ht 5\' 4"  (1.626 m)   Wt 163 lb (73.9 kg)   SpO2 99%   BMI 27.98 kg/m   16:51:05 Orthostatic Vital Signs TV  Orthostatic Lying   BP- Lying: 131/77  Pulse- Lying: 84      Orthostatic Sitting  BP- Sitting: 113/70   Pulse- Sitting: 89      Orthostatic Standing at 0 minutes  BP- Standing at 0 minutes: 126/78  Pulse- Standing at 0 minutes: 98   Patient Vitals for the past 24 hrs:  BP Temp Temp src Pulse Resp SpO2 Height Weight  10/25/15 2030 141/82 - - 81 17 96 % - -  10/25/15 2000 123/85 - - - 14 - - -  10/25/15 1930 104/88 - - - 13 - - -  10/25/15 1900 97/63 - - 77 13 95 % - -  10/25/15 1830 104/58 - - 80 12 95 % - -  10/25/15 1809 115/63 - - 80 18 96 % - -  10/25/15 1708 - 98 F (36.7 C) - - - - - -  10/25/15 1700 136/71 - - 80 15 99 % - -  10/25/15 1636 113/78 98 F (36.7 C) Oral 92 18 99 % - -  10/25/15 1635 - - - - - - 5\' 4"  (  1.626 m) 163 lb (73.9 kg)     Physical Exam 1655: Physical examination:  Nursing notes reviewed; Vital signs and O2 SAT reviewed;  Constitutional: Well developed, Well nourished, Well hydrated, In no acute distress; Head:  Normocephalic, atraumatic; Eyes: EOMI, PERRL, No scleral icterus; ENMT: Mouth and pharynx normal, Mucous membranes moist; Neck: Supple, Full range of motion, No lymphadenopathy; Cardiovascular: Regular rate and rhythm, No gallop; Respiratory: Breath sounds clear & equal bilaterally, No wheezes.  Speaking full sentences with ease, Normal respiratory effort/excursion; Chest: Nontender, Movement normal; Abdomen: Soft, Nontender, Nondistended, Normal bowel sounds; Genitourinary: No CVA tenderness; Extremities: Pulses normal, No tenderness, No edema, No calf edema or asymmetry.; Neuro: AA&Ox3, Major CN grossly intact. Speech clear.  No facial droop. +right horizontal end gaze fatigable nystagmus that reproduces pt's "dizziness." Grips equal. Strength 5/5 equal bilat UE's and LE's.  DTR 2/4 equal bilat UE's and LE's.  No gross sensory deficits.  Normal cerebellar testing bilat UE's (finger-nose) and LE's (heel-shin).  No pronator drift.; Skin: Color normal, Warm, Dry.   ED Treatments / Results  Labs (all labs ordered are listed, but only abnormal results  are displayed)   EKG  EKG Interpretation  Date/Time:  Saturday October 25 2015 16:49:01 EDT Ventricular Rate:  81 PR Interval:    QRS Duration: 87 QT Interval:  391 QTC Calculation: 454 R Axis:   -59 Text Interpretation:  Sinus rhythm Left axis deviation Left anterior fascicular block Anterior infarct, old Nonspecific ST and T wave abnormality Inferolateral leads When compared with ECG of 08/21/2009 Nonspecific ST and T wave abnormality is now Present Confirmed by [facility]  MD, Nunzio Cory [phone]) on 10/25/2015 5:08:21 PM       Radiology   Procedures Procedures (including critical care time)  Medications Ordered in ED Medications  meclizine (ANTIVERT) tablet 25 mg (25 mg Oral Given 10/25/15 1711)     Initial Impression / Assessment and Plan / ED Course  I have reviewed the triage vital signs and the nursing notes.  Pertinent labs & imaging results that were available during my care of the patient were reviewed by me and considered in my medical decision making (see chart for details).  MDM Reviewed: previous chart, nursing note and vitals Reviewed previous: labs and ECG Interpretation: labs, ECG, x-ray and CT scan    Results for orders placed or performed during the hospital encounter of 10/25/15  Comprehensive metabolic panel  Result Value Ref Range   Sodium 139 135 - 145 mmol/L   Potassium 4.0 3.5 - 5.1 mmol/L   Chloride 107 101 - 111 mmol/L   CO2 22 22 - 32 mmol/L   Glucose, Bld 86 65 - 99 mg/dL   BUN 23 (H) 6 - 20 mg/dL   Creatinine, Ser 1.09 (H) 0.44 - 1.00 mg/dL   Calcium 9.5 8.9 - 10.3 mg/dL   Total Protein 7.7 6.5 - 8.1 g/dL   Albumin 4.2 3.5 - 5.0 g/dL   AST 17 15 - 41 U/L   ALT 14 14 - 54 U/L   Alkaline Phosphatase 65 38 - 126 U/L   Total Bilirubin 0.5 0.3 - 1.2 mg/dL   GFR calc non Af Amer 46 (L) >60 mL/min   GFR calc Af Amer 53 (L) >60 mL/min   Anion gap 10 5 - 15  Troponin I  Result Value Ref Range   Troponin I 0.03 (HH) <0.03 ng/mL  Lactic acid,  plasma  Result Value Ref Range   Lactic Acid, Venous 1.4 0.5 -  1.9 mmol/L  CBC with Differential  Result Value Ref Range   WBC 10.1 4.0 - 10.5 K/uL   RBC 3.89 3.87 - 5.11 MIL/uL   Hemoglobin 13.2 12.0 - 15.0 g/dL   HCT 39.8 36.0 - 46.0 %   MCV 102.3 (H) 78.0 - 100.0 fL   MCH 33.9 26.0 - 34.0 pg   MCHC 33.2 30.0 - 36.0 g/dL   RDW 16.0 (H) 11.5 - 15.5 %   Platelets 529 (H) 150 - 400 K/uL   Neutrophils Relative % 51 %   Neutro Abs 5.1 1.7 - 7.7 K/uL   Lymphocytes Relative 39 %   Lymphs Abs 3.9 0.7 - 4.0 K/uL   Monocytes Relative 9 %   Monocytes Absolute 0.9 0.1 - 1.0 K/uL   Eosinophils Relative 1 %   Eosinophils Absolute 0.1 0.0 - 0.7 K/uL   Basophils Relative 0 %   Basophils Absolute 0.0 0.0 - 0.1 K/uL  Urinalysis, Routine w reflex microscopic  Result Value Ref Range   Color, Urine YELLOW YELLOW   APPearance CLEAR CLEAR   Specific Gravity, Urine 1.025 1.005 - 1.030   pH 5.5 5.0 - 8.0   Glucose, UA NEGATIVE NEGATIVE mg/dL   Hgb urine dipstick TRACE (A) NEGATIVE   Bilirubin Urine NEGATIVE NEGATIVE   Ketones, ur NEGATIVE NEGATIVE mg/dL   Protein, ur NEGATIVE NEGATIVE mg/dL   Nitrite NEGATIVE NEGATIVE   Leukocytes, UA NEGATIVE NEGATIVE  D-dimer, quantitative  Result Value Ref Range   D-Dimer, Quant 1.02 (H) 0.00 - 0.50 ug/mL-FEU  Urine microscopic-add on  Result Value Ref Range   Squamous Epithelial / LPF 0-5 (A) NONE SEEN   WBC, UA 0-5 0 - 5 WBC/hpf   RBC / HPF 0-5 0 - 5 RBC/hpf   Bacteria, UA RARE (A) NONE SEEN   Dg Chest 2 View Result Date: 10/25/2015 CLINICAL DATA:  Dizziness, spinning, and leaning to the right for 3 days. EXAM: CHEST  2 VIEW COMPARISON:  04/24/2015 FINDINGS: Mild cardiac enlargement. No pulmonary vascular congestion. Slight interstitial changes in the lung bases are unchanged since prior study and probably represent fibrosis. Interstitial pneumonitis not excluded. No focal airspace disease or consolidation. No blunting of costophrenic angles. No  pneumothorax. Tortuous and calcified aorta. Degenerative changes in the spine. Surgical clips in right upper quadrant. IMPRESSION: Cardiac enlargement. No consolidation or edema. Chronic interstitial pattern to the lung bases. Electronically Signed   By: Lucienne Capers M.D.   On: 10/25/2015 18:08   Ct Head Wo Contrast Result Date: 10/25/2015 CLINICAL DATA:  Dizzy, spinning, and leaning to the right for 3 days. History of vertigo, non-Hodgkin's lymphoma. EXAM: CT HEAD WITHOUT CONTRAST TECHNIQUE: Contiguous axial images were obtained from the base of the skull through the vertex without intravenous contrast. COMPARISON:  04/21/2006 FINDINGS: Brain: Mild cerebral atrophy. Mild ventricular dilatation consistent with central atrophy. Patchy low-attenuation changes in the deep white matter consistent small vessel ischemia. No mass effect or midline shift. No abnormal extra-axial fluid collections. Gray-white matter junctions are distinct. Basal cisterns are not effaced. No evidence of acute intracranial hemorrhage. Vascular: No hyperdense vessel or unexpected calcification. Skull: No depressed skull fractures. Sinuses/Orbits: Small retention cysts in the maxillary antrum and frontal sinus. No acute air-fluid levels. Cystic structure demonstrated in the maxilla, incompletely included within the field of view possibly representing a dental related cyst. Mastoid air cells are not opacified. Other: Intracranial contents demonstrate no significant change since prior study. IMPRESSION: No acute intracranial abnormalities. Chronic atrophy and small  vessel ischemic changes. Electronically Signed   By: Lucienne Capers M.D.   On: 10/25/2015 18:19   Ct Angio Chest Pe W/cm &/or Wo Cm Result Date: 10/25/2015 CLINICAL DATA:  82 year old female with chest pain and shortness of breath for 3 weeks. Elevated D-dimer. History of non-Hodgkin's lymphoma. EXAM: CT ANGIOGRAPHY CHEST TECHNIQUE: Multidetector CT imaging through the chest  was performed using the standard protocol during bolus administration of intravenous contrast. Multiplanar reconstructed images and MIPs were obtained and reviewed to evaluate the vascular anatomy. CONTRAST:  100 cc intravenous Isovue 370 COMPARISON:  10/25/2015 and prior chest radiographs. 05/22/2012 PET-CT and 02/11/2009 chest CT. FINDINGS: CTA CHEST FINDINGS Cardiovascular: This is a technically satisfactory adequate study. No pulmonary emboli are identified. Cardiomegaly and coronary artery calcifications are present. Thoracic aortic atherosclerotic calcifications are present without aneurysm. Mediastinum/Nodes: No mediastinal mass, enlarged lymph nodes or pericardial effusion. Lungs/Pleura: Mild bibasilar atelectasis/ scarring again noted. Mild dependent interlobular septal thickening bilaterally unchanged. There is no evidence of airspace disease, consolidation, nodule, mass, pleural effusion or pneumothorax. Upper Abdomen: No acute abnormality. A small hiatal hernia is noted. Musculoskeletal: No acute or suspicious abnormality. Review of the MIP images confirms the above findings. IMPRESSION: No acute abnormality. No evidence of pulmonary emboli or thoracic aortic aneurysm. Cardiomegaly and coronary artery disease. Small hiatal hernia. Electronically Signed   By: Margarette Canada M.D.   On: 10/25/2015 19:52    2045:  Pt ambulated; states she continues to feel "dizzy." Troponin mildly elevated with new NS STTW changes on EKG; denies CP currently. No PE or TAA on CT scan. Dx and testing d/w pt and family.  Questions answered.  Verb understanding, agreeable to observation admit. T/C to [facility] Dr. Shanon Brow, case discussed, including:  HPI, pertinent PM/SHx, VS/PE, dx testing, ED course and treatment:  Agreeable to admit, requests to write temporary orders, obtain observation tele bed to Dr. Josephine Cables service.    Final Clinical Impressions(s) / ED Diagnoses   Final diagnoses:  None    New Prescriptions New  Prescriptions   No medications on file     Francine Graven, DO 10/29/15 1659

## 2015-10-25 NOTE — ED Notes (Signed)
Pt gone over to CT 

## 2015-10-25 NOTE — ED Notes (Signed)
Report given to [facility] on 300

## 2015-10-25 NOTE — ED Notes (Signed)
Critical Troponin 0.03. MD notified. 

## 2015-10-25 NOTE — ED Notes (Signed)
Pt ambulated to restroom x 1 assist. Slow gait. Complains of continued dizziness/unsteady feeling.

## 2015-10-25 NOTE — H&P (Signed)
History and Physical    Leslie Williamson [id number] DOB: 1931/05/10 DOA: 10/25/2015  PCP: Rosita Fire, MD  Patient coming from: home  Chief Complaint:  Chest pain and dizziness  HPI: Leslie Williamson is a 82 y.o. female with medical history significant of vertigo last episode about 6 months ago, HTN, HLD comes in with 4 days of vertiginous symptoms that is worse when she stands up to walk and she leans to the right due to imbalance and spinning.  She denies any falls.  No recent illnesses.  No n/v/d.  No fevers.  No numbness, tingling or weakness anywhere.  When she lies down the vertigo resolves.  Pt was given some meclizine in the ED and she states her symptoms are already much better.  Pt also mentioned to EDP dr Elise Benne that she had an episode of chest pain approximately 2 weeks ago that lasted for over 20 minutes and resolved spontaneously.  A trop was checked and it is 0.03.  Pt is referred for admission by dr Elise Benne for patients positive troponin and complaint of chest pain that occurred 2 weeks ago.   Review of Systems: As per HPI otherwise 10 point review of systems negative.   Past Medical History:  Diagnosis Date  . Anxiety   . Depression   . Hypercholesteremia   . Hypertension   . Hypokalemia   . Iron deficiency anemia   . Left hip pain   . NHL (non-Hodgkin's lymphoma) ([place]) 12/17/2010  . Obesity   . Primary thrombocytosis ([place]) 01/11/2006   Secondary to splenectomy    . PVD (peripheral vascular disease) ([place])   . S/P splenectomy 01/01/2014  . 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 Vertigo, labyrinthine     Past Surgical History:  Procedure Laterality Date  . PORT-A-CATH REMOVAL       reports that she has quit smoking. She has never used smokeless tobacco. She reports that she does not drink alcohol or use drugs.  Allergies  Allergen Reactions  . Penicillins Rash    Family  History  Problem Relation Age of Onset  . Diabetes Mother     Prior to Admission medications   Medication Sig Start Date End Date Taking? Authorizing Provider  amLODipine (NORVASC) 2.5 MG tablet Take 2.5 mg by mouth daily. 01/04/15  Yes Historical Provider, MD  aspirin 81 MG tablet Take 81 mg by mouth daily.   Yes Historical Provider, MD  atorvastatin (LIPITOR) 40 MG tablet Take 40 mg by mouth daily. 01/07/15  Yes Historical Provider, MD  cilostazol (PLETAL) 100 MG tablet Take 100 mg by mouth 2 (two) times daily.   Yes Historical Provider, MD  diclofenac (CATAFLAM) 50 MG tablet Take 1 tablet (50 mg total) by mouth 2 (two) times daily. 10/14/15  Yes Carole Civil, MD  hydroxyurea (HYDREA) 500 MG capsule TAKE 1 CAPSULE BY MOUTH ON [place]. MAY TAKE WITH FOOD TO MINIMIZE GI SIDE EFFECTS. 09/18/15  Yes Baird Cancer, PA-C  ibuprofen (ADVIL,MOTRIN) 800 MG tablet Take 800 mg by mouth every 8 (eight) hours as needed for moderate pain.  03/10/15  Yes Historical Provider, MD  losartan-hydrochlorothiazide (HYZAAR) 100-12.5 MG per tablet Take 1 tablet by mouth daily. Reported on 06/30/2015   Yes Historical Provider, MD  potassium chloride SA (K-DUR,KLOR-CON) 20 MEQ tablet TAKE ONE TABLET BY MOUTH DAILY. 10/06/15  Yes Baird Cancer, PA-C  Physical Exam: Vitals:   10/25/15 1930 10/25/15 2000 10/25/15 2030 10/25/15 2100  BP: 104/88 123/85 141/82 131/70  Pulse:   81 83  Resp: 13 14 17 17   Temp:      TempSrc:      SpO2:   96% 99%  Weight:      Height:          Constitutional: NAD, calm, comfortable Vitals:   10/25/15 1930 10/25/15 2000 10/25/15 2030 10/25/15 2100  BP: 104/88 123/85 141/82 131/70  Pulse:   81 83  Resp: 13 14 17 17   Temp:      TempSrc:      SpO2:   96% 99%  Weight:      Height:       Eyes: PERRL, lids and conjunctivae normal ENMT: Mucous membranes are moist. Posterior pharynx clear of any exudate or lesions.Normal dentition.  Neck: normal, supple, no  masses, no thyromegaly Respiratory: clear to auscultation bilaterally, no wheezing, no crackles. Normal respiratory effort. No accessory muscle use.  Cardiovascular: Regular rate and rhythm, no murmurs / rubs / gallops. No extremity edema. 2+ pedal pulses. No carotid bruits.  Abdomen: no tenderness, no masses palpated. No hepatosplenomegaly. Bowel sounds positive.  Musculoskeletal: no clubbing / cyanosis. No joint deformity upper and lower extremities. Good ROM, no contractures. Normal muscle tone.  Skin: no rashes, lesions, ulcers. No induration Neurologic: CN 2-12 grossly intact. Sensation intact, DTR normal. Strength 5/5 in all 4.  Psychiatric: Normal judgment and insight. Alert and oriented x 3. Normal mood.    Labs on Admission: I have personally reviewed following labs and imaging studies  CBC:  Recent Labs Lab 10/21/15 1228 10/25/15 1706  WBC 7.6 10.1  NEUTROABS 4.0 5.1  HGB 12.1 13.2  HCT 37.2 39.8  MCV 102.5* 102.3*  PLT 539* 123XX123*   Basic Metabolic Panel:  Recent Labs Lab 10/21/15 1228 10/25/15 1706  NA 136 139  K 4.2 4.0  CL 111 107  CO2 23 22  GLUCOSE 141* 86  BUN 27* 23*  CREATININE 1.10* 1.09*  CALCIUM 8.6* 9.5   GFR: Estimated Creatinine Clearance: 39.2 mL/min (by C-G formula based on SCr of 1.09 mg/dL). Liver Function Tests:  Recent Labs Lab 10/21/15 1228 10/25/15 1706  AST 19 17  ALT 15 14  ALKPHOS 59 65  BILITOT 0.6 0.5  PROT 6.7 7.7  ALBUMIN 3.6 4.2   Cardiac Enzymes:  Recent Labs Lab 10/25/15 1706  TROPONINI 0.03*   Urine analysis:    Component Value Date/Time   COLORURINE YELLOW 10/25/2015 1810   APPEARANCEUR CLEAR 10/25/2015 1810   LABSPEC 1.025 10/25/2015 1810   PHURINE 5.5 10/25/2015 1810   GLUCOSEU NEGATIVE 10/25/2015 1810   HGBUR TRACE (A) 10/25/2015 1810   HGBUR large 06/19/2007 0845   BILIRUBINUR NEGATIVE 10/25/2015 1810   KETONESUR NEGATIVE 10/25/2015 1810   PROTEINUR NEGATIVE 10/25/2015 1810   UROBILINOGEN 0.2  07/03/2012 1929   NITRITE NEGATIVE 10/25/2015 1810   LEUKOCYTESUR NEGATIVE 10/25/2015 1810   Radiological Exams on Admission: Dg Chest 2 View  Result Date: 10/25/2015 CLINICAL DATA:  Dizziness, spinning, and leaning to the right for 3 days. EXAM: CHEST  2 VIEW COMPARISON:  04/24/2015 FINDINGS: Mild cardiac enlargement. No pulmonary vascular congestion. Slight interstitial changes in the lung bases are unchanged since prior study and probably represent fibrosis. Interstitial pneumonitis not excluded. No focal airspace disease or consolidation. No blunting of costophrenic angles. No pneumothorax. Tortuous and calcified aorta. Degenerative changes in the spine. Surgical clips  in right upper quadrant. IMPRESSION: Cardiac enlargement. No consolidation or edema. Chronic interstitial pattern to the lung bases. Electronically Signed   By: Lucienne Capers M.D.   On: 10/25/2015 18:08   Ct Head Wo Contrast  Result Date: 10/25/2015 CLINICAL DATA:  Dizzy, spinning, and leaning to the right for 3 days. History of vertigo, non-Hodgkin's lymphoma. EXAM: CT HEAD WITHOUT CONTRAST TECHNIQUE: Contiguous axial images were obtained from the base of the skull through the vertex without intravenous contrast. COMPARISON:  04/21/2006 FINDINGS: Brain: Mild cerebral atrophy. Mild ventricular dilatation consistent with central atrophy. Patchy low-attenuation changes in the deep white matter consistent small vessel ischemia. No mass effect or midline shift. No abnormal extra-axial fluid collections. Gray-white matter junctions are distinct. Basal cisterns are not effaced. No evidence of acute intracranial hemorrhage. Vascular: No hyperdense vessel or unexpected calcification. Skull: No depressed skull fractures. Sinuses/Orbits: Small retention cysts in the maxillary antrum and frontal sinus. No acute air-fluid levels. Cystic structure demonstrated in the maxilla, incompletely included within the field of view possibly representing a  dental related cyst. Mastoid air cells are not opacified. Other: Intracranial contents demonstrate no significant change since prior study. IMPRESSION: No acute intracranial abnormalities. Chronic atrophy and small vessel ischemic changes. Electronically Signed   By: Lucienne Capers M.D.   On: 10/25/2015 18:19   Ct Angio Chest Pe W/cm &/or Wo Cm  Result Date: 10/25/2015 CLINICAL DATA:  81 year old female with chest pain and shortness of breath for 3 weeks. Elevated D-dimer. History of non-Hodgkin's lymphoma. EXAM: CT ANGIOGRAPHY CHEST TECHNIQUE: Multidetector CT imaging through the chest was performed using the standard protocol during bolus administration of intravenous contrast. Multiplanar reconstructed images and MIPs were obtained and reviewed to evaluate the vascular anatomy. CONTRAST:  100 cc intravenous Isovue 370 COMPARISON:  10/25/2015 and prior chest radiographs. 05/22/2012 PET-CT and 02/11/2009 chest CT. FINDINGS: CTA CHEST FINDINGS Cardiovascular: This is a technically satisfactory adequate study. No pulmonary emboli are identified. Cardiomegaly and coronary artery calcifications are present. Thoracic aortic atherosclerotic calcifications are present without aneurysm. Mediastinum/Nodes: No mediastinal mass, enlarged lymph nodes or pericardial effusion. Lungs/Pleura: Mild bibasilar atelectasis/ scarring again noted. Mild dependent interlobular septal thickening bilaterally unchanged. There is no evidence of airspace disease, consolidation, nodule, mass, pleural effusion or pneumothorax. Upper Abdomen: No acute abnormality. A small hiatal hernia is noted. Musculoskeletal: No acute or suspicious abnormality. Review of the MIP images confirms the above findings. IMPRESSION: No acute abnormality. No evidence of pulmonary emboli or thoracic aortic aneurysm. Cardiomegaly and coronary artery disease. Small hiatal hernia. Electronically Signed   By: Margarette Canada M.D.   On: 10/25/2015 19:52    EKG:  Independently reviewed. nsr no acute issues  Assessment/Plan 82 yo female with chest pain 2 weeks ago, troponin of 0.03 and recurrence of her vertigo  Principal Problem:   Chest pain 2 weeks ago- seriously doubt ACS.  Place on aspirin.  Obtain cardiac echo in the am.  Pt did have stress testing done in 2011 here but I cannot up pull up the final report in epic.  Would have her follow up with her PCP for further evaluation for the need for stress testing as an outpatient.  Serial troponin.  Active Problems:   Essential hypertension- noted   NHL (non-Hodgkin's lymphoma) ([place])- noted   Vertigo, labyrinthine- already improved with meclizine.  Orthostatics neg in ED.  Cont meclizine TID.  No focal neurological deficits.  If any develop, consider MRI but at this time, will see if resolves with  meclizine and supportive care for vertigo   obs on tele.     DVT prophylaxis: scds  Code Status: full Family Communication: none Disposition Plan: per day team   Chandan Fly A MD [facility]  If 7PM-7AM, please contact night-coverage [email or web address] Password [facility]  10/25/2015, 9:42 PM

## 2015-10-26 ENCOUNTER — Observation Stay (HOSPITAL_BASED_OUTPATIENT_CLINIC_OR_DEPARTMENT_OTHER): Payer: Medicare Other

## 2015-10-26 DIAGNOSIS — R0789 Other chest pain: Secondary | ICD-10-CM

## 2015-10-26 DIAGNOSIS — H8109 Meniere's disease, unspecified ear: Secondary | ICD-10-CM | POA: Diagnosis present

## 2015-10-26 DIAGNOSIS — H81393 Other peripheral vertigo, bilateral: Secondary | ICD-10-CM | POA: Diagnosis not present

## 2015-10-26 DIAGNOSIS — R7989 Other specified abnormal findings of blood chemistry: Secondary | ICD-10-CM

## 2015-10-26 DIAGNOSIS — I10 Essential (primary) hypertension: Secondary | ICD-10-CM

## 2015-10-26 DIAGNOSIS — R079 Chest pain, unspecified: Secondary | ICD-10-CM

## 2015-10-26 LAB — ECHOCARDIOGRAM COMPLETE
AVLVOTPG: 4 mmHg
CHL CUP TV REG PEAK VELOCITY: 230 cm/s
E/e' ratio: 9.02
EWDT: 489 ms
FS: 37 % (ref 28–44)
HEIGHTINCHES: 64 in
IVS/LV PW RATIO, ED: 1.16
LA ID, A-P, ES: 34 mm
LA diam end sys: 34 mm
LA diam index: 1.86 cm/m2
LA vol A4C: 37.6 ml
LA vol index: 21.1 mL/m2
LA vol: 38.5 mL
LDCA: 2.54 cm2
LV E/e' medial: 9.02
LV PW d: 10.5 mm — AB (ref 0.6–1.1)
LV SIMPSON'S DISK: 66
LV TDI E'MEDIAL: 5
LV dias vol: 37 mL — AB (ref 46–106)
LV e' LATERAL: 5.22 cm/s
LVDIAVOLIN: 20 mL/m2
LVEEAVG: 9.02
LVOT SV: 43 mL
LVOT VTI: 17.1 cm
LVOT diameter: 18 mm
LVOT peak vel: 102 cm/s
LVSYSVOL: 13 mL — AB (ref 14–42)
LVSYSVOLIN: 7 mL/m2
MV Dec: 489
MV pk A vel: 106 m/s
MVPKEVEL: 47.1 m/s
RV LATERAL S' VELOCITY: 20.1 cm/s
RV TAPSE: 19.5 mm
RV sys press: 24 mmHg
Stroke v: 24 ml
TDI e' lateral: 5.22
TRMAXVEL: 230 cm/s
WEIGHTICAEL: 2555.2 [oz_av]

## 2015-10-26 LAB — TROPONIN I
TROPONIN I: 0.03 ng/mL — AB (ref <0.03)
TROPONIN I: 0.04 ng/mL — AB (ref <0.03)
Troponin I: 0.03 ng/mL (ref <0.03)

## 2015-10-26 MED ORDER — MECLIZINE HCL 25 MG PO TABS: 25.0000 mg | ORAL_TABLET | Freq: Three times a day (TID) | ORAL | 1 refills | Status: DC | PRN

## 2015-10-26 NOTE — Care Management Obs Status (Signed)
[place] NOTIFICATION   Patient Details  Name: Leslie Williamson MRN: [id number] Date of Birth: 03-09-1931   Medicare Observation Status Notification Given:  Yes    Briant Sites, RN 10/26/2015, 3:12 PM

## 2015-10-26 NOTE — Discharge Instructions (Signed)
Vertigo Vertigo means that you feel like you are moving when you are not. Vertigo can also make you feel like things around you are moving when they are not. This feeling can come and go at any time. Vertigo often goes away on its own. HOME CARE  Avoid making fast movements.  Avoid driving.  Avoid using heavy machinery.  Avoid doing any task or activity that might cause danger to you or other people if you would have a vertigo attack while you are doing it.  Sit down right away if you feel dizzy or have trouble with your balance.  Take over-the-counter and prescription medicines only as told by your doctor.  Follow instructions from your doctor about which positions or movements you should avoid.  Drink enough fluid to keep your pee (urine) clear or pale yellow.  Keep all follow-up visits as told by your doctor. This is important. GET HELP IF:  Medicine does not help your vertigo.  You have a fever.  Your problems get worse or you have new symptoms.  Your family or friends see changes in your behavior.  You feel sick to your stomach (nauseous) or you throw up (vomit).  You have a "pins and needles" feeling or you are numb in part of your body. GET HELP RIGHT AWAY IF:  You have trouble moving or talking.  You are always dizzy.  You pass out (faint).  You get very bad headaches.  You feel weak or have trouble using your hands, arms, or legs.  You have changes in your hearing.  You have changes in your seeing (vision).  You get a stiff neck.  Bright light starts to bother you.   This information is not intended to replace advice given to you by your health care provider. Make sure you discuss any questions you have with your health care provider.   Document Released: 11/18/2007 Document Revised: 10/30/2014 Document Reviewed: 06/03/2014 Elsevier Interactive Patient Education 2016 [employer]  

## 2015-10-26 NOTE — Discharge Summary (Signed)
Physician Discharge Summary  Patient ID: Leslie Williamson MRN: [id number] DOB/AGE: 1931-04-30 82 y.o.  Admit date: 10/25/2015 Discharge date: 10/26/2015  Admission Diagnoses:  Discharge Diagnoses:  Principal Problem:   Labyrinthine vertigo Active Problems:   Essential hypertension   NHL (non-Hodgkin's lymphoma) (HCC)   Elevated troponin   Chest pain   Discharged Condition: good  Hospital Course: 82 y.o. female with medical history significant of vertigo last episode about 6 months ago, HTN, HLD comes in with 4 days of vertiginous symptoms that is worse when she stands up to walk and she leans to the right due to imbalance and spinning.  She denies any falls.  No recent illnesses.  No n/v/d.  No fevers.  No numbness, tingling or weakness anywhere.  When she lies down the vertigo resolves.  Pt was given some meclizine in the ED and she states her symptoms are already much better.  Pt also mentioned to EDP dr Richrd Prime that she had an episode of chest pain approximately 2 weeks ago that lasted for over 20 minutes and resolved spontaneously.  A trop was checked and it is 0.03.  Pt is referred for admission by dr Richrd Prime for patients positive troponin and complaint of chest pain that occurred 2 weeks ago.  Problems: 1. Vertigo , labyrinthine - resolved overnight w prn antivert.  Rx written for 25 mg antivert tid prn at dc. 2. Chest pain - borderline elevated trops (0.3- 0.4) prob due to CKD.  Atypical CP.  No further w/u, ECHO showed normal LVEF and no WMA and no valve disease.  F/U w PCP for further problems with this.  3.  HTN - stable, no change 4.  NHL  Consults: None  Significant Diagnostic Studies: none  Treatments: antivert  Discharge Exam: Blood pressure 117/61, pulse 86, temperature 97.8 F (36.6 C), temperature source Oral, resp. rate 18, height 5\' 4"  (1.626 m), weight 72.4 kg (159 lb 11.2 oz), SpO2 100 %. Eyes: PERRL, lids and conjunctivae normal ENMT: Mucous membranes are  moist. Posterior pharynx clear of any exudate or lesions.Normal dentition.  Neck: normal, supple, no masses, no thyromegaly Respiratory: clear to auscultation bilaterally, no wheezing, no crackles. Normal respiratory effort. No accessory muscle use.  Cardiovascular: Regular rate and rhythm, no murmurs / rubs / gallops. No extremity edema. 2+ pedal pulses. No carotid bruits.  Abdomen: no tenderness, no masses palpated. No hepatosplenomegaly. Bowel sounds positive.  Musculoskeletal: no clubbing / cyanosis. No joint deformity upper and lower extremities. Good ROM, no contractures. Normal muscle tone.  Skin: no rashes, lesions, ulcers. No induration Neurologic: CN 2-12 grossly intact. Sensation intact, DTR normal. Strength 5/5 in all 4.  Psychiatric: Normal judgment and insight. Alert and oriented x 3. Normal mood.   Disposition: 01-Home or Self Care     Medication List    TAKE these medications   amLODipine 2.5 MG tablet Commonly known as:  NORVASC Take 2.5 mg by mouth daily.   aspirin 81 MG tablet Take 81 mg by mouth daily.   atorvastatin 40 MG tablet Commonly known as:  LIPITOR Take 40 mg by mouth daily.   cilostazol 100 MG tablet Commonly known as:  PLETAL Take 100 mg by mouth 2 (two) times daily.   diclofenac 50 MG tablet Commonly known as:  CATAFLAM Take 1 tablet (50 mg total) by mouth 2 (two) times daily.   hydroxyurea 500 MG capsule Commonly known as:  HYDREA TAKE 1 CAPSULE BY MOUTH ON MONDAY -FRIDAY. MAY TAKE WITH FOOD  TO MINIMIZE GI SIDE EFFECTS.   ibuprofen 800 MG tablet Commonly known as:  ADVIL,MOTRIN Take 800 mg by mouth every 8 (eight) hours as needed for moderate pain.   losartan-hydrochlorothiazide 100-12.5 MG tablet Commonly known as:  HYZAAR Take 1 tablet by mouth daily. Reported on 06/30/2015   meclizine 25 MG tablet Commonly known as:  ANTIVERT Take 1 tablet (25 mg total) by mouth 3 (three) times daily as needed for dizziness.   potassium chloride SA  20 MEQ tablet Commonly known as:  K-DUR,KLOR-CON TAKE ONE TABLET BY MOUTH DAILY.        SignedRoney Jaffe D 10/26/2015, 7:57 PM

## 2015-10-26 NOTE — Progress Notes (Signed)
CRITICAL VALUE ALERT  Critical value received:  Trop=0.04  Date of notification:  10/26/15  Time of notification:  0610  Critical value read back:Yes.    Nurse who received alert:  Manuela Schwartz, RN   MD notified (1st page):  Dr. Shanon Brow  Time of first page:  0612  MD notified (2nd page):  Time of second page:  Responding MD:    Time MD responded:  No new orders given

## 2015-10-26 NOTE — Progress Notes (Signed)
Pt verbalized understanding of discharge information.  She was taken off the floor via wheelchair to go home with her family.

## 2015-10-26 NOTE — Progress Notes (Signed)
*  PRELIMINARY RESULTS* Echocardiogram 2D Echocardiogram has been performed.  Leslie Williamson 10/26/2015, 10:53 AM

## 2015-10-27 LAB — URINE CULTURE: Culture: <10000 — AB

## 2015-10-29 ENCOUNTER — Encounter (HOSPITAL_COMMUNITY): Payer: Self-pay

## 2015-10-29 ENCOUNTER — Encounter (HOSPITAL_COMMUNITY)
Admission: RE | Admit: 2015-10-29 | Discharge: 2015-10-29 | Disposition: A | Discharge status: Home / Self Care | Payer: Medicare Other | Source: Ambulatory Visit | Attending: Ophthalmology | Admitting: Ophthalmology

## 2015-10-29 DIAGNOSIS — Z01818 Encounter for other preprocedural examination: Secondary | ICD-10-CM | POA: Insufficient documentation

## 2015-10-29 HISTORY — DX: Adverse effect of unspecified anesthetic, initial encounter: T41.45XA

## 2015-10-29 HISTORY — DX: Other complications of anesthesia, initial encounter: T88.59XA

## 2015-11-03 ENCOUNTER — Encounter (HOSPITAL_COMMUNITY): Payer: Self-pay | Admitting: *Deleted

## 2015-11-03 ENCOUNTER — Encounter (HOSPITAL_COMMUNITY)
Admission: RE | Disposition: A | Discharge status: Home / Self Care | Payer: Self-pay | Source: Ambulatory Visit | Attending: Ophthalmology

## 2015-11-03 ENCOUNTER — Ambulatory Visit (HOSPITAL_COMMUNITY): Payer: Medicare Other | Admitting: Anesthesiology

## 2015-11-03 ENCOUNTER — Ambulatory Visit (HOSPITAL_COMMUNITY)
Admission: RE | Admit: 2015-11-03 | Discharge: 2015-11-03 | Disposition: A | Discharge status: Home / Self Care | Payer: Medicare Other | Source: Ambulatory Visit | Attending: Ophthalmology | Admitting: Ophthalmology

## 2015-11-03 DIAGNOSIS — Z7982 Long term (current) use of aspirin: Secondary | ICD-10-CM | POA: Diagnosis not present

## 2015-11-03 DIAGNOSIS — Z8572 Personal history of non-Hodgkin lymphomas: Secondary | ICD-10-CM | POA: Insufficient documentation

## 2015-11-03 DIAGNOSIS — I10 Essential (primary) hypertension: Secondary | ICD-10-CM | POA: Diagnosis not present

## 2015-11-03 DIAGNOSIS — H25812 Combined forms of age-related cataract, left eye: Secondary | ICD-10-CM | POA: Insufficient documentation

## 2015-11-03 DIAGNOSIS — I739 Peripheral vascular disease, unspecified: Secondary | ICD-10-CM | POA: Diagnosis not present

## 2015-11-03 DIAGNOSIS — Z87891 Personal history of nicotine dependence: Secondary | ICD-10-CM | POA: Insufficient documentation

## 2015-11-03 DIAGNOSIS — Z79899 Other long term (current) drug therapy: Secondary | ICD-10-CM | POA: Insufficient documentation

## 2015-11-03 DIAGNOSIS — H2512 Age-related nuclear cataract, left eye: Secondary | ICD-10-CM | POA: Diagnosis not present

## 2015-11-03 HISTORY — PX: CATARACT EXTRACTION W/PHACO: SHX586

## 2015-11-03 SURGERY — PHACOEMULSIFICATION, CATARACT, WITH IOL INSERTION
Anesthesia: Monitor Anesthesia Care | Site: Eye | Laterality: Left

## 2015-11-03 MED ORDER — LACTATED RINGERS IV SOLN
INTRAVENOUS | Status: DC
  Administered 2015-11-03: 10:00:00 via INTRAVENOUS

## 2015-11-03 MED ORDER — LIDOCAINE HCL 3.5 % OP GEL
1.0000 "application " | Freq: Once | OPHTHALMIC | Status: AC
  Administered 2015-11-03: 1 via OPHTHALMIC

## 2015-11-03 MED ORDER — LIDOCAINE HCL (PF) 1 % IJ SOLN
INTRAMUSCULAR | Status: DC | PRN
  Administered 2015-11-03: .5 mL

## 2015-11-03 MED ORDER — POVIDONE-IODINE 5 % OP SOLN
OPHTHALMIC | Status: DC | PRN
  Administered 2015-11-03: 1 via OPHTHALMIC

## 2015-11-03 MED ORDER — TETRACAINE HCL 0.5 % OP SOLN
1.0000 [drp] | OPHTHALMIC | Status: AC
  Administered 2015-11-03 (×3): 1 [drp] via OPHTHALMIC

## 2015-11-03 MED ORDER — NEOMYCIN-POLYMYXIN-DEXAMETH 3.5-10000-0.1 OP SUSP
OPHTHALMIC | Status: DC | PRN
Start: 2015-11-03 — End: 2015-11-03
  Administered 2015-11-03: 2 [drp] via OPHTHALMIC

## 2015-11-03 MED ORDER — CYCLOPENTOLATE-PHENYLEPHRINE 0.2-1 % OP SOLN
1.0000 [drp] | OPHTHALMIC | Status: AC
  Administered 2015-11-03 (×3): 1 [drp] via OPHTHALMIC

## 2015-11-03 MED ORDER — PROVISC 10 MG/ML IO SOLN
INTRAOCULAR | Status: DC | PRN
  Administered 2015-11-03: 0.85 mL via INTRAOCULAR

## 2015-11-03 MED ORDER — MIDAZOLAM HCL 2 MG/2ML IJ SOLN
2.0000 mg | Freq: Once | INTRAMUSCULAR | Status: AC
  Administered 2015-11-03: 2 mg via INTRAVENOUS
  Filled 2015-11-03: qty 2

## 2015-11-03 MED ORDER — LIDOCAINE 3.5 % OP GEL OPTIME - NO CHARGE
OPHTHALMIC | Status: DC | PRN
  Administered 2015-11-03: 1 [drp] via OPHTHALMIC

## 2015-11-03 MED ORDER — FENTANYL CITRATE (PF) 100 MCG/2ML IJ SOLN
25.0000 ug | Freq: Once | INTRAMUSCULAR | Status: AC
  Administered 2015-11-03: 25 ug via INTRAVENOUS
  Filled 2015-11-03: qty 2

## 2015-11-03 MED ORDER — BSS IO SOLN
INTRAOCULAR | Status: DC | PRN
  Administered 2015-11-03: 15 mL

## 2015-11-03 MED ORDER — BSS IO SOLN
INTRAOCULAR | Status: DC | PRN
  Administered 2015-11-03: 500 mL

## 2015-11-03 MED ORDER — PHENYLEPHRINE HCL 2.5 % OP SOLN
1.0000 [drp] | OPHTHALMIC | Status: AC
  Administered 2015-11-03 (×3): 1 [drp] via OPHTHALMIC

## 2015-11-03 MED ORDER — EPINEPHRINE HCL 1 MG/ML IJ SOLN
INTRAMUSCULAR | Status: AC
  Filled 2015-11-03: qty 1

## 2015-11-03 SURGICAL SUPPLY — 14 items
CLOTH BEACON ORANGE TIMEOUT ST (SAFETY) ×1 IMPLANT
EYE SHIELD UNIVERSAL CLEAR (GAUZE/BANDAGES/DRESSINGS) ×1 IMPLANT
GLOVE BIOGEL PI IND STRL 6.5 (GLOVE) IMPLANT
GLOVE BIOGEL PI IND STRL 7.0 (GLOVE) IMPLANT
GLOVE BIOGEL PI INDICATOR 6.5 (GLOVE) ×1
GLOVE BIOGEL PI INDICATOR 7.0 (GLOVE) ×1
GLOVE EXAM NITRILE MD LF STRL (GLOVE) ×1 IMPLANT
PAD ARMBOARD 7.5X6 YLW CONV (MISCELLANEOUS) ×1 IMPLANT
SIGHTPATH CAT PROC W REG LENS (Ophthalmic Related) ×2 IMPLANT
SYRINGE LUER LOK 1CC (MISCELLANEOUS) ×1 IMPLANT
TAPE SURG TRANSPORE 1 IN (GAUZE/BANDAGES/DRESSINGS) IMPLANT
TAPE SURGICAL TRANSPORE 1 IN (GAUZE/BANDAGES/DRESSINGS) ×1
VISCOELASTIC ADDITIONAL (OPHTHALMIC RELATED) IMPLANT
WATER STERILE IRR 250ML POUR (IV SOLUTION) ×1 IMPLANT

## 2015-11-03 NOTE — Anesthesia Procedure Notes (Signed)
Procedure Name: MAC Date/Time: 11/03/2015 10:26 AM Performed by: Andree Elk, AMY A Pre-anesthesia Checklist: Patient identified, Timeout performed, Emergency Drugs available, Suction available and Patient being monitored Oxygen Delivery Method: Nasal cannula

## 2015-11-03 NOTE — Discharge Instructions (Signed)

## 2015-11-03 NOTE — Anesthesia Postprocedure Evaluation (Signed)
Anesthesia Post Note  Patient: Leslie Williamson  Procedure(s) Performed: Procedure(s) (LRB): CATARACT EXTRACTION PHACO AND INTRAOCULAR LENS PLACEMENT (IOC) (Left)  Patient location during evaluation: Short Stay Anesthesia Type: MAC Level of consciousness: awake and alert and oriented Pain management: pain level controlled Vital Signs Assessment: post-procedure vital signs reviewed and stable Respiratory status: spontaneous breathing Cardiovascular status: stable Postop Assessment: no signs of nausea or vomiting Anesthetic complications: no    Last Vitals:  Vitals:   11/03/15 1000 11/03/15 1015  Resp: 15 (!) 0  Temp: 36.8 C     Last Pain: There were no vitals filed for this visit.               ADAMS, AMY A

## 2015-11-03 NOTE — Anesthesia Preprocedure Evaluation (Signed)
Anesthesia Evaluation  Patient identified by MRN, date of birth, ID band Patient awake    Reviewed: Allergy & Precautions, H&P , NPO status , Patient's Chart, lab work & pertinent test results  Airway Mallampati: II   Neck ROM: full    Dental   Pulmonary former smoker,    breath sounds clear to auscultation       Cardiovascular hypertension, + Peripheral Vascular Disease   Rhythm:regular Rate:Normal     Neuro/Psych PSYCHIATRIC DISORDERS Anxiety Depression  Neuromuscular disease    GI/Hepatic GERD  ,  Endo/Other    Renal/GU      Musculoskeletal   Abdominal   Peds  Hematology Non-hodgkin's lymphoma   Anesthesia Other Findings   Reproductive/Obstetrics                             Anesthesia Physical Anesthesia Plan  ASA: III  Anesthesia Plan: MAC   Post-op Pain Management:    Induction: Intravenous  Airway Management Planned: Nasal Cannula  Additional Equipment:   Intra-op Plan:   Post-operative Plan:   Informed Consent: I have reviewed the patients History and Physical, chart, labs and discussed the procedure including the risks, benefits and alternatives for the proposed anesthesia with the patient or authorized representative who has indicated his/her understanding and acceptance.     Plan Discussed with: CRNA, Anesthesiologist and Surgeon  Anesthesia Plan Comments:         Anesthesia Quick Evaluation

## 2015-11-03 NOTE — H&P (Signed)
I have reviewed the H&P, the patient was re-examined, and I have identified no interval changes in medical condition and plan of care since the history and physical of record  

## 2015-11-03 NOTE — Op Note (Signed)
Date of Admission: 11/03/2015  Date of Surgery: 11/03/2015   Pre-Op Dx: Cataract Left Eye  Post-Op Dx: Senile Combined Cataract Left  Eye,  Dx Code [id number]  Surgeon: Tonny Branch, M.D.  Assistants: None  Anesthesia: Topical with MAC  Indications: Painless, progressive loss of vision with compromise of daily activities.  Surgery: Cataract Extraction with Intraocular lens Implant Left Eye  Discription: The patient had dilating drops and viscous lidocaine placed into the Left eye in the pre-op holding area. After transfer to the operating room, a time out was performed. The patient was then prepped and draped. Beginning with a 72 degree blade a paracentesis port was made at the surgeon's 2 o'clock position. The anterior chamber was then filled with 1% non-preserved lidocaine. This was followed by filling the anterior chamber with Provisc.  A 2.6mm keratome blade was used to make a clear corneal incision at the temporal limbus.  A bent cystatome needle was used to create a continuous tear capsulotomy. Hydrodissection was performed with balanced salt solution on a Fine canula. The lens nucleus was then removed using the phacoemulsification handpiece. Residual cortex was removed with the I&A handpiece. The anterior chamber and capsular bag were refilled with Provisc. A posterior chamber intraocular lens was placed into the capsular bag with it's injector. The implant was positioned with the Kuglan hook. The Provisc was then removed from the anterior chamber and capsular bag with the I&A handpiece. Stromal hydration of the main incision and paracentesis port was performed with BSS on a Fine canula. The wounds were tested for leak which was negative. The patient tolerated the procedure well. There were no operative complications. The patient was then transferred to the recovery room in stable condition.  Complications: None  Specimen: None  EBL: None  Prosthetic device: Hoya iSert 250, power 20.0 D, SN  [id number].

## 2015-11-03 NOTE — Transfer of Care (Signed)
Immediate Anesthesia Transfer of Care Note  Patient: Leslie Williamson  Procedure(s) Performed: Procedure(s) with comments: CATARACT EXTRACTION PHACO AND INTRAOCULAR LENS PLACEMENT (IOC) (Left) - CDE: 7.74  Patient Location: Short Stay  Anesthesia Type:MAC  Level of Consciousness: awake, alert , oriented and patient cooperative  Airway & Oxygen Therapy: Patient Spontanous Breathing  Post-op Assessment: Report given to RN and Post -op Vital signs reviewed and stable  Post vital signs: Reviewed and stable  Last Vitals:  Vitals:   11/03/15 1000 11/03/15 1015  Resp: 15 (!) 0  Temp: 36.8 C     Last Pain: There were no vitals filed for this visit.       Complications: No apparent anesthesia complications

## 2015-11-06 ENCOUNTER — Encounter (HOSPITAL_COMMUNITY): Payer: Self-pay | Admitting: Ophthalmology

## 2015-11-07 DIAGNOSIS — D471 Chronic myeloproliferative disease: Secondary | ICD-10-CM | POA: Diagnosis not present

## 2015-11-07 DIAGNOSIS — I10 Essential (primary) hypertension: Secondary | ICD-10-CM | POA: Diagnosis not present

## 2015-11-07 DIAGNOSIS — H8303 Labyrinthitis, bilateral: Secondary | ICD-10-CM | POA: Diagnosis not present

## 2015-11-07 DIAGNOSIS — Z23 Encounter for immunization: Secondary | ICD-10-CM | POA: Diagnosis not present

## 2015-11-07 DIAGNOSIS — I739 Peripheral vascular disease, unspecified: Secondary | ICD-10-CM | POA: Diagnosis not present

## 2015-11-10 ENCOUNTER — Other Ambulatory Visit (HOSPITAL_COMMUNITY): Payer: Self-pay | Admitting: Oncology

## 2015-11-10 DIAGNOSIS — E876 Hypokalemia: Secondary | ICD-10-CM

## 2015-11-14 MED ORDER — OXYCODONE HCL 20 MG/ML PO CONC: 5.0000 mg | Freq: Once | ORAL | Status: DC | PRN

## 2015-11-14 MED ORDER — ONDANSETRON HCL 4 MG/2ML IJ SOLN: 4.0000 mg | Freq: Four times a day (QID) | INTRAMUSCULAR | Status: DC | PRN

## 2015-11-14 MED ORDER — FENTANYL CITRATE (PF) 100 MCG/2ML IJ SOLN: 25.0000 ug | INTRAMUSCULAR | Status: DC | PRN

## 2015-11-14 MED ORDER — OXYCODONE HCL 5 MG PO TABS: 5.0000 mg | ORAL_TABLET | Freq: Once | ORAL | Status: DC | PRN

## 2015-11-17 ENCOUNTER — Encounter (HOSPITAL_COMMUNITY): Payer: Self-pay

## 2015-11-17 ENCOUNTER — Encounter (HOSPITAL_COMMUNITY)
Admission: RE | Admit: 2015-11-17 | Discharge: 2015-11-17 | Disposition: A | Discharge status: Home / Self Care | Payer: Medicare Other | Source: Ambulatory Visit | Attending: Ophthalmology | Admitting: Ophthalmology

## 2015-11-17 DIAGNOSIS — H25811 Combined forms of age-related cataract, right eye: Secondary | ICD-10-CM | POA: Diagnosis not present

## 2015-11-18 ENCOUNTER — Ambulatory Visit (HOSPITAL_COMMUNITY): Payer: Medicare Other | Admitting: Hematology & Oncology

## 2015-11-18 ENCOUNTER — Other Ambulatory Visit (HOSPITAL_COMMUNITY): Payer: Medicare Other

## 2015-11-20 ENCOUNTER — Encounter (HOSPITAL_COMMUNITY)
Admission: RE | Disposition: A | Discharge status: Home / Self Care | Payer: Self-pay | Source: Ambulatory Visit | Attending: Ophthalmology

## 2015-11-20 ENCOUNTER — Ambulatory Visit (HOSPITAL_COMMUNITY): Payer: Medicare Other | Admitting: Anesthesiology

## 2015-11-20 ENCOUNTER — Ambulatory Visit (HOSPITAL_COMMUNITY)
Admission: RE | Admit: 2015-11-20 | Discharge: 2015-11-20 | Disposition: A | Discharge status: Home / Self Care | Payer: Medicare Other | Source: Ambulatory Visit | Attending: Ophthalmology | Admitting: Ophthalmology

## 2015-11-20 ENCOUNTER — Encounter (HOSPITAL_COMMUNITY): Payer: Self-pay | Admitting: *Deleted

## 2015-11-20 DIAGNOSIS — Z880 Allergy status to penicillin: Secondary | ICD-10-CM | POA: Diagnosis not present

## 2015-11-20 DIAGNOSIS — F418 Other specified anxiety disorders: Secondary | ICD-10-CM | POA: Insufficient documentation

## 2015-11-20 DIAGNOSIS — I10 Essential (primary) hypertension: Secondary | ICD-10-CM | POA: Insufficient documentation

## 2015-11-20 DIAGNOSIS — H25811 Combined forms of age-related cataract, right eye: Secondary | ICD-10-CM | POA: Insufficient documentation

## 2015-11-20 DIAGNOSIS — Z9081 Acquired absence of spleen: Secondary | ICD-10-CM | POA: Diagnosis not present

## 2015-11-20 DIAGNOSIS — Z79899 Other long term (current) drug therapy: Secondary | ICD-10-CM | POA: Diagnosis not present

## 2015-11-20 DIAGNOSIS — I739 Peripheral vascular disease, unspecified: Secondary | ICD-10-CM | POA: Insufficient documentation

## 2015-11-20 DIAGNOSIS — Z9049 Acquired absence of other specified parts of digestive tract: Secondary | ICD-10-CM | POA: Insufficient documentation

## 2015-11-20 DIAGNOSIS — E7800 Pure hypercholesterolemia, unspecified: Secondary | ICD-10-CM | POA: Diagnosis not present

## 2015-11-20 DIAGNOSIS — R7303 Prediabetes: Secondary | ICD-10-CM | POA: Insufficient documentation

## 2015-11-20 DIAGNOSIS — H2511 Age-related nuclear cataract, right eye: Secondary | ICD-10-CM | POA: Diagnosis not present

## 2015-11-20 DIAGNOSIS — Z87891 Personal history of nicotine dependence: Secondary | ICD-10-CM | POA: Insufficient documentation

## 2015-11-20 DIAGNOSIS — Z8572 Personal history of non-Hodgkin lymphomas: Secondary | ICD-10-CM | POA: Insufficient documentation

## 2015-11-20 HISTORY — PX: CATARACT EXTRACTION W/PHACO: SHX586

## 2015-11-20 SURGERY — PHACOEMULSIFICATION, CATARACT, WITH IOL INSERTION
Anesthesia: Monitor Anesthesia Care | Site: Eye | Laterality: Right

## 2015-11-20 MED ORDER — LIDOCAINE HCL (PF) 1 % IJ SOLN
INTRAMUSCULAR | Status: DC | PRN
  Administered 2015-11-20: .5 mL

## 2015-11-20 MED ORDER — POVIDONE-IODINE 5 % OP SOLN
OPHTHALMIC | Status: DC | PRN
  Administered 2015-11-20: 1 via OPHTHALMIC

## 2015-11-20 MED ORDER — FENTANYL CITRATE (PF) 100 MCG/2ML IJ SOLN
INTRAMUSCULAR | Status: AC
  Filled 2015-11-20: qty 2

## 2015-11-20 MED ORDER — PROVISC 10 MG/ML IO SOLN
INTRAOCULAR | Status: DC | PRN
  Administered 2015-11-20: 0.85 mL via INTRAOCULAR

## 2015-11-20 MED ORDER — TETRACAINE HCL 0.5 % OP SOLN
1.0000 [drp] | OPHTHALMIC | Status: AC
  Administered 2015-11-20 (×3): 1 [drp] via OPHTHALMIC

## 2015-11-20 MED ORDER — MIDAZOLAM HCL 2 MG/2ML IJ SOLN
INTRAMUSCULAR | Status: AC
  Filled 2015-11-20: qty 2

## 2015-11-20 MED ORDER — LIDOCAINE 3.5 % OP GEL OPTIME - NO CHARGE
OPHTHALMIC | Status: DC | PRN
  Administered 2015-11-20: 1 [drp] via OPHTHALMIC

## 2015-11-20 MED ORDER — NEOMYCIN-POLYMYXIN-DEXAMETH 3.5-10000-0.1 OP SUSP
OPHTHALMIC | Status: DC | PRN
  Administered 2015-11-20: 2 [drp] via OPHTHALMIC

## 2015-11-20 MED ORDER — LACTATED RINGERS IV SOLN
INTRAVENOUS | Status: DC
  Administered 2015-11-20: 12:00:00 via INTRAVENOUS

## 2015-11-20 MED ORDER — LIDOCAINE HCL 3.5 % OP GEL: 1.0000 "application " | Freq: Once | OPHTHALMIC | Status: DC

## 2015-11-20 MED ORDER — FENTANYL CITRATE (PF) 100 MCG/2ML IJ SOLN
25.0000 ug | INTRAMUSCULAR | Status: DC | PRN
  Administered 2015-11-20: 25 ug via INTRAVENOUS

## 2015-11-20 MED ORDER — BSS IO SOLN
INTRAOCULAR | Status: DC | PRN
  Administered 2015-11-20: 500 mL

## 2015-11-20 MED ORDER — CYCLOPENTOLATE-PHENYLEPHRINE 0.2-1 % OP SOLN
1.0000 [drp] | OPHTHALMIC | Status: AC
  Administered 2015-11-20 (×3): 1 [drp] via OPHTHALMIC

## 2015-11-20 MED ORDER — MIDAZOLAM HCL 5 MG/5ML IJ SOLN
INTRAMUSCULAR | Status: DC | PRN
  Administered 2015-11-20: 2 mg via INTRAVENOUS

## 2015-11-20 MED ORDER — EPINEPHRINE HCL 1 MG/ML IJ SOLN
INTRAMUSCULAR | Status: AC
  Filled 2015-11-20: qty 1

## 2015-11-20 MED ORDER — MIDAZOLAM HCL 2 MG/2ML IJ SOLN
1.0000 mg | INTRAMUSCULAR | Status: DC | PRN
  Administered 2015-11-20: 2 mg via INTRAVENOUS

## 2015-11-20 MED ORDER — PHENYLEPHRINE HCL 2.5 % OP SOLN
1.0000 [drp] | OPHTHALMIC | Status: AC
  Administered 2015-11-20 (×3): 1 [drp] via OPHTHALMIC

## 2015-11-20 MED ORDER — BSS IO SOLN
INTRAOCULAR | Status: DC | PRN
  Administered 2015-11-20: 15 mL via INTRAOCULAR

## 2015-11-20 SURGICAL SUPPLY — 23 items
CAPSULAR TENSION RING-AMO (OPHTHALMIC RELATED) IMPLANT
CLOTH BEACON ORANGE TIMEOUT ST (SAFETY) ×1 IMPLANT
EYE SHIELD UNIVERSAL CLEAR (GAUZE/BANDAGES/DRESSINGS) ×1 IMPLANT
GLOVE BIOGEL PI IND STRL 6.5 (GLOVE) IMPLANT
GLOVE BIOGEL PI IND STRL 7.0 (GLOVE) IMPLANT
GLOVE BIOGEL PI INDICATOR 6.5 (GLOVE) ×1
GLOVE BIOGEL PI INDICATOR 7.0 (GLOVE)
GLOVE EXAM NITRILE LRG STRL (GLOVE) IMPLANT
GLOVE EXAM NITRILE MD LF STRL (GLOVE) ×1 IMPLANT
KIT VITRECTOMY (OPHTHALMIC RELATED) IMPLANT
PAD ARMBOARD 7.5X6 YLW CONV (MISCELLANEOUS) ×1 IMPLANT
PROC W NO LENS (INTRAOCULAR LENS)
PROC W SPEC LENS (INTRAOCULAR LENS)
PROCESS W NO LENS (INTRAOCULAR LENS) IMPLANT
PROCESS W SPEC LENS (INTRAOCULAR LENS) IMPLANT
RETRACTOR IRIS SIGHTPATH (OPHTHALMIC RELATED) IMPLANT
RING MALYGIN (MISCELLANEOUS) IMPLANT
SIGHTPATH CAT PROC W REG LENS (Ophthalmic Related) ×2 IMPLANT
SYRINGE LUER LOK 1CC (MISCELLANEOUS) ×1 IMPLANT
TAPE SURG TRANSPORE 1 IN (GAUZE/BANDAGES/DRESSINGS) IMPLANT
TAPE SURGICAL TRANSPORE 1 IN (GAUZE/BANDAGES/DRESSINGS) ×1
VISCOELASTIC ADDITIONAL (OPHTHALMIC RELATED) IMPLANT
WATER STERILE IRR 250ML POUR (IV SOLUTION) ×1 IMPLANT

## 2015-11-20 NOTE — Transfer of Care (Signed)
Immediate Anesthesia Transfer of Care Note  Patient: Leslie Williamson  Procedure(s) Performed: Procedure(s): CATARACT EXTRACTION PHACO AND INTRAOCULAR LENS PLACEMENT ; CDE:  8.30 (Right)  Patient Location: Short Stay  Anesthesia Type:MAC  Level of Consciousness: awake  Airway & Oxygen Therapy: Patient Spontanous Breathing  Post-op Assessment: Report given to RN  Post vital signs: Reviewed  Last Vitals:  Vitals:   11/20/15 1150 11/20/15 1155  BP: (!) 105/59 104/61  Resp: 14 15  Temp:      Last Pain:  Vitals:   11/20/15 1133  TempSrc: Oral      Patients Stated Pain Goal: 7 ([id number])  Complications: No apparent anesthesia complications

## 2015-11-20 NOTE — Op Note (Signed)
Date of Admission: 11/20/2015  Date of Surgery: 11/20/2015   Pre-Op Dx: Cataract Right Eye  Post-Op Dx: Senile Combined Cataract Right  Eye,  Dx Code [id number]  Surgeon: Tonny Branch, M.D.  Assistants: None  Anesthesia: Topical with MAC  Indications: Painless, progressive loss of vision with compromise of daily activities.  Surgery: Cataract Extraction with Intraocular lens Implant Right Eye  Discription: The patient had dilating drops and viscous lidocaine placed into the Right eye in the pre-op holding area. After transfer to the operating room, a time out was performed. The patient was then prepped and draped. Beginning with a 64 degree blade a paracentesis port was made at the surgeon's 2 o'clock position. The anterior chamber was then filled with 1% non-preserved lidocaine. This was followed by filling the anterior chamber with Provisc.  A 2.38mm keratome blade was used to make a clear corneal incision at the temporal limbus.  A bent cystatome needle was used to create a continuous tear capsulotomy. Hydrodissection was performed with balanced salt solution on a Fine canula. The lens nucleus was then removed using the phacoemulsification handpiece. Residual cortex was removed with the I&A handpiece. The anterior chamber and capsular bag were refilled with Provisc. A posterior chamber intraocular lens was placed into the capsular bag with it's injector. The implant was positioned with the Kuglan hook. The Provisc was then removed from the anterior chamber and capsular bag with the I&A handpiece. Stromal hydration of the main incision and paracentesis port was performed with BSS on a Fine canula. The wounds were tested for leak which was negative. The patient tolerated the procedure well. There were no operative complications. The patient was then transferred to the recovery room in stable condition.  Complications: None  Specimen: None  EBL: None  Prosthetic device: Hoya iSert 250, power 20.0  D, SN [id number].

## 2015-11-20 NOTE — H&P (Signed)
I have reviewed the H&P, the patient was re-examined, and I have identified no interval changes in medical condition and plan of care since the history and physical of record  

## 2015-11-20 NOTE — Anesthesia Postprocedure Evaluation (Signed)
Anesthesia Post Note  Patient: Leslie Williamson  Procedure(s) Performed: Procedure(s) (LRB): CATARACT EXTRACTION PHACO AND INTRAOCULAR LENS PLACEMENT ; CDE:  8.30 (Right)  Patient location during evaluation: Short Stay Anesthesia Type: MAC Level of consciousness: awake and alert and awake Pain management: pain level controlled Vital Signs Assessment: post-procedure vital signs reviewed and stable Respiratory status: spontaneous breathing Cardiovascular status: blood pressure returned to baseline Postop Assessment: no signs of nausea or vomiting Anesthetic complications: no    Last Vitals:  Vitals:   11/20/15 1150 11/20/15 1155  BP: (!) 105/59 104/61  Resp: 14 15  Temp:      Last Pain:  Vitals:   11/20/15 1133  TempSrc: Oral                 Veera Stapleton

## 2015-11-20 NOTE — Discharge Instructions (Signed)

## 2015-11-20 NOTE — Anesthesia Preprocedure Evaluation (Signed)
Anesthesia Evaluation  Patient identified by MRN, date of birth, ID band Patient awake    Reviewed: Allergy & Precautions, H&P , NPO status , Patient's Chart, lab work & pertinent test results  Airway Mallampati: II   Neck ROM: full    Dental   Pulmonary former smoker,    breath sounds clear to auscultation       Cardiovascular hypertension, + Peripheral Vascular Disease   Rhythm:regular Rate:Normal     Neuro/Psych PSYCHIATRIC DISORDERS Anxiety Depression  Neuromuscular disease    GI/Hepatic GERD  ,  Endo/Other    Renal/GU      Musculoskeletal   Abdominal   Peds  Hematology Non-hodgkin's lymphoma   Anesthesia Other Findings   Reproductive/Obstetrics                             Anesthesia Physical Anesthesia Plan  ASA: III  Anesthesia Plan: MAC   Post-op Pain Management:    Induction: Intravenous  Airway Management Planned: Nasal Cannula  Additional Equipment:   Intra-op Plan:   Post-operative Plan:   Informed Consent: I have reviewed the patients History and Physical, chart, labs and discussed the procedure including the risks, benefits and alternatives for the proposed anesthesia with the patient or authorized representative who has indicated his/her understanding and acceptance.     Plan Discussed with: CRNA, Anesthesiologist and Surgeon  Anesthesia Plan Comments:         Anesthesia Quick Evaluation

## 2015-11-24 ENCOUNTER — Encounter (HOSPITAL_COMMUNITY): Payer: Self-pay | Admitting: Ophthalmology

## 2015-11-25 DIAGNOSIS — M79673 Pain in unspecified foot: Secondary | ICD-10-CM | POA: Diagnosis not present

## 2015-12-03 ENCOUNTER — Other Ambulatory Visit (HOSPITAL_COMMUNITY): Payer: Self-pay | Admitting: Oncology

## 2015-12-09 ENCOUNTER — Other Ambulatory Visit (HOSPITAL_COMMUNITY): Payer: Self-pay | Admitting: Oncology

## 2015-12-09 DIAGNOSIS — D4701 Cutaneous mastocytosis: Secondary | ICD-10-CM | POA: Diagnosis not present

## 2015-12-09 DIAGNOSIS — E876 Hypokalemia: Secondary | ICD-10-CM

## 2015-12-09 DIAGNOSIS — I10 Essential (primary) hypertension: Secondary | ICD-10-CM | POA: Diagnosis not present

## 2015-12-09 DIAGNOSIS — I7300 Raynaud's syndrome without gangrene: Secondary | ICD-10-CM | POA: Diagnosis not present

## 2015-12-09 DIAGNOSIS — C8590 Non-Hodgkin lymphoma, unspecified, unspecified site: Secondary | ICD-10-CM | POA: Diagnosis not present

## 2015-12-10 DIAGNOSIS — I739 Peripheral vascular disease, unspecified: Secondary | ICD-10-CM | POA: Diagnosis not present

## 2015-12-25 ENCOUNTER — Encounter (HOSPITAL_COMMUNITY): Payer: Medicare Other | Attending: Hematology & Oncology | Admitting: Oncology

## 2015-12-25 ENCOUNTER — Encounter (HOSPITAL_COMMUNITY): Payer: Self-pay | Admitting: Oncology

## 2015-12-25 ENCOUNTER — Encounter (HOSPITAL_COMMUNITY): Payer: Medicare Other

## 2015-12-25 VITALS — BP 115/59 | HR 82 | Resp 18 | Ht 64.0 in | Wt 165.0 lb

## 2015-12-25 DIAGNOSIS — D509 Iron deficiency anemia, unspecified: Secondary | ICD-10-CM

## 2015-12-25 DIAGNOSIS — Z8572 Personal history of non-Hodgkin lymphomas: Secondary | ICD-10-CM | POA: Diagnosis not present

## 2015-12-25 DIAGNOSIS — C8330 Diffuse large B-cell lymphoma, unspecified site: Secondary | ICD-10-CM

## 2015-12-25 DIAGNOSIS — D47Z9 Other specified neoplasms of uncertain behavior of lymphoid, hematopoietic and related tissue: Secondary | ICD-10-CM

## 2015-12-25 DIAGNOSIS — D471 Chronic myeloproliferative disease: Secondary | ICD-10-CM | POA: Diagnosis not present

## 2015-12-25 LAB — CBC WITH DIFFERENTIAL/PLATELET
BASOS ABS: 0 10*3/uL (ref 0.0–0.1)
BASOS PCT: 0 %
EOS ABS: 0.1 10*3/uL (ref 0.0–0.7)
Eosinophils Relative: 2 %
HCT: 36.6 % (ref 36.0–46.0)
Hemoglobin: 12.1 g/dL (ref 12.0–15.0)
Lymphocytes Relative: 41 %
Lymphs Abs: 3.8 10*3/uL (ref 0.7–4.0)
MCH: 34.8 pg — ABNORMAL HIGH (ref 26.0–34.0)
MCHC: 33.1 g/dL (ref 30.0–36.0)
MCV: 105.2 fL — ABNORMAL HIGH (ref 78.0–100.0)
MONO ABS: 1.1 10*3/uL — AB (ref 0.1–1.0)
MONOS PCT: 12 %
NEUTROS PCT: 45 %
Neutro Abs: 4.2 10*3/uL (ref 1.7–7.7)
Platelets: 494 10*3/uL — ABNORMAL HIGH (ref 150–400)
RBC: 3.48 MIL/uL — ABNORMAL LOW (ref 3.87–5.11)
RDW: 18 % — AB (ref 11.5–15.5)
WBC: 9.2 10*3/uL (ref 4.0–10.5)

## 2015-12-25 LAB — COMPREHENSIVE METABOLIC PANEL
ALBUMIN: 3.7 g/dL (ref 3.5–5.0)
ALT: 11 U/L — ABNORMAL LOW (ref 14–54)
ANION GAP: 6 (ref 5–15)
AST: 15 U/L (ref 15–41)
Alkaline Phosphatase: 55 U/L (ref 38–126)
BUN: 21 mg/dL — ABNORMAL HIGH (ref 6–20)
CHLORIDE: 106 mmol/L (ref 101–111)
CO2: 24 mmol/L (ref 22–32)
Calcium: 9 mg/dL (ref 8.9–10.3)
Creatinine, Ser: 0.95 mg/dL (ref 0.44–1.00)
GFR calc Af Amer: >60 mL/min (ref >60)
GFR calc non Af Amer: 54 mL/min — ABNORMAL LOW (ref >60)
GLUCOSE: 104 mg/dL — AB (ref 65–99)
POTASSIUM: 3.8 mmol/L (ref 3.5–5.1)
SODIUM: 136 mmol/L (ref 135–145)
Total Bilirubin: 0.6 mg/dL (ref 0.3–1.2)
Total Protein: 6.9 g/dL (ref 6.5–8.1)

## 2015-12-25 LAB — IRON AND TIBC
Iron: 77 ug/dL (ref 28–170)
SATURATION RATIOS: 21 % (ref 10.4–31.8)
TIBC: 367 ug/dL (ref 250–450)
UIBC: 290 ug/dL

## 2015-12-25 LAB — FERRITIN: FERRITIN: 11 ng/mL (ref 11–307)

## 2015-12-25 MED ORDER — HYDROXYUREA 500 MG PO CAPS: ORAL_CAPSULE | ORAL | 2 refills | Status: DC

## 2015-12-25 NOTE — Assessment & Plan Note (Addendum)
JAK2 V617F positive myeloproliferative disorder presenting with thrombocytosis, with preservation of HGB/RBC/HCT and intermittent leukocytosis (lymphocyte predominance).  On Hydrea 500 mg Monday- Saturday.  Labs today: CBC diff, CMET.  I personally reviewed and went over laboratory results with the patient.  The results are noted within this dictation.  Platelet count is not at goal.  She reports that she is taking Hydrea Mon-Friday only.  I will increase Hydrea to 500 mg Monday-Saturday.  Labs every 3 weeks: CBC diff.  Labs in ~ 9 weeks: CBC diff, CMET.  Continue ASA therapy as prescribed.  She is overdue for mammogram.  We will need to discuss mammogram imaging in the near future (she has been resistant to this in the past).  Return in 9 weeks for follow-up with breast exam.

## 2015-12-25 NOTE — Progress Notes (Signed)
      [name],[name], MD [address] [place] [address]  JAK2+ MDP (myeloproliferative disorder) presenting with thrombocytosis (HCC) - Plan: hydroxyurea (HYDREA) 500 MG capsule, CBC with Differential, Comprehensive metabolic panel, CBC with Differential  Diffuse large B-cell lymphoma, unspecified body region (HCC)  Iron deficiency anemia, unspecified iron deficiency anemia type - Plan: Iron and TIBC, Ferritin  CURRENT THERAPY: Hydrea 500 mg PO Monday- Saturday.  Surveillance per NCCN guidelines for DBCL  INTERVAL HISTORY: Leslie Williamson 82 y.o. female returns for followup of JAK2+ myeloproliferative disorder presenting with thrombocytosis, thought to be secondary to splenectomy in the past with JAK2 testing in 2014 being negative.  HOWEVER, thrombocytosis progressed and repeat JAK2 was positive for V617F mutation on 01/22/2015. AND transformation from a low-grade lymphoma to large cell high grade B-cell lymphoma treated with 6 cycles of R.-CHOP ending on 09/08/1998 resulting in a complete remission. AND   intermittent iron deficiency for which she received intravenous Feraheme.      NHL (non-Hodgkin's lymphoma) (HCC)   07/19/1995 Pathology Results    Spleen biopsy with resection- low-grade B-cell lymphoma, splenic marginal zone type      02/20/1998 Imaging    CT CAP-marked and extensive cervical adenopathy      02/25/1998 Pathology Results    Right cervical lymph node demonstrating large B-cell lymphoma      03/05/1998 Bone Marrow Biopsy    No evidence of bone marrow involvement      03/11/1998 - 07/18/1998 Chemotherapy    CHOP x 7 cycles      05/09/1998 Imaging    CT CAP and neck- slight interval decrease in size of para-aortic adenopathy with interval decrease in right inguinal and bi-iliac adenopathy.  Interval decrease in size and number of enlarged cervical nodes      07/10/1998 Imaging    Ct CAP and neck- unremarkable CT of chest. No enlarged  retroperitoneal adenopathy withthe previously noted retroperitoneal nodes currently smaller to stable in size.  Cervical adenopathy is stable to slightly smaller in size.      08/18/1998 - 09/08/1998 Chemotherapy    Rituxan Day 1, 8, 15, 22 x 1 cycle      01/08/1999 Remission    CT CAP and neck demonstrates no adenopathy or disease      01/08/1999 Imaging    CT CAP and neck- Stable CT of chest. Stable CT abd/pelvis without adenopathy.  Negative CT of neck       She is doing well.  She denies any complaints.  She notes that her dosing is 500 mg Monday-Friday.  She notes only 1 missed dose in the past month.    She denies any B symptoms including fevers, chills, night sweats, and unintentional weight loss.  Review of Systems  Constitutional: Negative.  Negative for chills, fever and weight loss.  HENT: Negative.   Eyes: Negative.   Respiratory: Negative.  Negative for cough and hemoptysis.   Cardiovascular: Negative.  Negative for chest pain.  Gastrointestinal: Negative for constipation, diarrhea, nausea and vomiting.  Genitourinary: Negative.   Musculoskeletal: Negative.   Skin: Negative.   Neurological: Negative.  Negative for weakness.  Endo/Heme/Allergies: Negative.   Psychiatric/Behavioral: Negative.     Past Medical History:  Diagnosis Date  . Anxiety   . Complication of anesthesia    difficult to wake up  . Depression   . Hypercholesteremia   . Hypertension   . Hypokalemia   . Iron deficiency anemia   . Left hip   pain   . NHL (non-Hodgkin's lymphoma) ([place]) 12/17/2010  . Obesity   . Primary thrombocytosis ([place]) 01/11/2006   Secondary to splenectomy    . PVD (peripheral vascular disease) ([place])   . S/P splenectomy 01/01/2014  . 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 Vertigo, labyrinthine     Past Surgical History:  Procedure Laterality Date  . CATARACT EXTRACTION W/PHACO Left  11/03/2015   Procedure: CATARACT EXTRACTION PHACO AND INTRAOCULAR LENS PLACEMENT (IOC);  Surgeon: Tonny Branch, MD;  Location: [facility];  Service: Ophthalmology;  Laterality: Left;  CDE: 7.74  . CATARACT EXTRACTION W/PHACO Right 11/20/2015   Procedure: CATARACT EXTRACTION PHACO AND INTRAOCULAR LENS PLACEMENT ; CDE:  8.30;  Surgeon: Tonny Branch, MD;  Location: [facility];  Service: Ophthalmology;  Laterality: Right;  . PORT-A-CATH REMOVAL      Family History  Problem Relation Age of Onset  . Diabetes Mother     Social History   Social History  . Marital status: Widowed    Spouse name: N/A  . Number of children: N/A  . Years of education: N/A   Social History Main Topics  . Smoking status: Former Research scientist (life sciences)  . Smokeless tobacco: Never Used  . Alcohol use No  . Drug use: No  . Sexual activity: No   Other Topics Concern  . None   Social History Narrative  . None     PHYSICAL EXAMINATION  ECOG PERFORMANCE STATUS: 1 - Symptomatic but completely ambulatory  Vitals:   12/25/15 1201  BP: (!) 115/59  Pulse: 82  Resp: 18    GENERAL:alert, no distress, well nourished, well developed, comfortable, cooperative, obese, smiling and unaccompanied SKIN: skin color, texture, turgor are normal, no rashes or significant lesions HEAD: Normocephalic, No masses, lesions, tenderness or abnormalities EYES: normal, EOMI, Conjunctiva are pink and non-injected EARS: External ears normal OROPHARYNX:lips, buccal mucosa, and tongue normal and mucous membranes are moist  NECK: supple, no adenopathy, trachea midline LYMPH:  no palpable lymphadenopathy BREAST:not examined LUNGS: clear to auscultation and percussion HEART: regular rate & rhythm, no murmurs, no gallops, S1 normal and S2 normal ABDOMEN:abdomen soft, non-tender, obese and normal bowel sounds BACK: Back symmetric, no curvature. EXTREMITIES:less then 2 second capillary refill, no joint deformities, effusion, or inflammation, no edema, no skin  discoloration, no clubbing, no cyanosis  NEURO: alert & oriented x 3 with fluent speech, no focal motor/sensory deficits, gait normal   LABORATORY DATA: CBC    Component Value Date/Time   WBC 9.2 12/25/2015 1010   RBC 3.48 (L) 12/25/2015 1010   HGB 12.1 12/25/2015 1010   HCT 36.6 12/25/2015 1010   PLT 494 (H) 12/25/2015 1010   MCV 105.2 (H) 12/25/2015 1010   MCH 34.8 (H) 12/25/2015 1010   MCHC 33.1 12/25/2015 1010   RDW 18.0 (H) 12/25/2015 1010   LYMPHSABS 3.8 12/25/2015 1010   MONOABS 1.1 (H) 12/25/2015 1010   EOSABS 0.1 12/25/2015 1010   BASOSABS 0.0 12/25/2015 1010      Chemistry      Component Value Date/Time   NA 136 12/25/2015 1010   K 3.8 12/25/2015 1010   CL 106 12/25/2015 1010   CO2 24 12/25/2015 1010   BUN 21 (H) 12/25/2015 1010   CREATININE 0.95 12/25/2015 1010      Component Value Date/Time   CALCIUM 9.0 12/25/2015 1010   ALKPHOS 55 12/25/2015 1010   AST 15 12/25/2015 1010  ALT 11 (L) 12/25/2015 1010   BILITOT 0.6 12/25/2015 1010     Lab Results  Component Value Date   IRON 77 12/25/2015   TIBC 367 12/25/2015   FERRITIN 11 12/25/2015     PENDING LABS:   RADIOGRAPHIC STUDIES:  No results found.   PATHOLOGY:    ASSESSMENT AND PLAN:  JAK2+ MDP (myeloproliferative disorder) presenting with thrombocytosis ([place]) JAK2 V617F positive myeloproliferative disorder presenting with thrombocytosis, with preservation of HGB/RBC/HCT and intermittent leukocytosis (lymphocyte predominance).  On Hydrea 500 mg Monday- Saturday.  Labs today: CBC diff, CMET.  I personally reviewed and went over laboratory results with the patient.  The results are noted within this dictation.  Platelet count is not at goal.  She reports that she is taking Hydrea Mon-Friday only.  I will increase Hydrea to 500 mg Monday-Saturday.  Labs every 3 weeks: CBC diff.  Labs in ~ 9 weeks: CBC diff, CMET.  Continue ASA therapy as prescribed.  She is overdue for mammogram.  We  will need to discuss mammogram imaging in the near future (she has been resistant to this in the past).  Return in 9 weeks for follow-up with breast exam.  NHL (non-Hodgkin's lymphoma) ([place]) Transformation from a low-grade lymphoma to large cell high grade B-cell lymphoma treated with 6 cycles of R-CHOP ending on 09/08/1998 resulting in a complete remission.  She had a mammogram in Dec 2015 recommending a short-interval follow-up mammogram 6 months later.  This was not done.  Order placed for B/L diagnostic mammogram with Ultrasounds to be done this month.  Iron deficiency anemia Intermittent iron deficiency for which she received intravenous Feraheme.  Labs today: CBC diff, iron/TIBC, ferritin.   ORDERS PLACED FOR THIS ENCOUNTER: Orders Placed This Encounter  Procedures  . Iron and TIBC  . Ferritin  . CBC with Differential  . Comprehensive metabolic panel  . CBC with Differential    MEDICATIONS PRESCRIBED THIS ENCOUNTER: Meds ordered this encounter  Medications  . hydroxyurea (HYDREA) 500 MG capsule    Sig: Take 500 mg PO Monday- Saturday.  May take with food to minimize GI side effects.    Dispense:  40 capsule    Refill:  2    Order Specific Question:   Supervising Provider    Answer:   Patrici Ranks [id number]  . DISCONTD: prednisoLONE acetate (PRED FORTE) 1 % ophthalmic suspension    Refill:  0  . DISCONTD: ketorolac (ACULAR) 0.4 % SOLN    Refill:  0    THERAPY PLAN:  Hydrea 500 mg Monday- Saturday.  All questions were answered. The patient knows to call the clinic with any problems, questions or concerns. We can certainly see the patient much sooner if necessary.  Patient and plan discussed with Dr. Ancil Linsey and she is in agreement with the aforementioned.   This note is electronically signed by: Doy Mince 12/25/2015 5:47 PM

## 2015-12-25 NOTE — Assessment & Plan Note (Signed)
Transformation from a low-grade lymphoma to large cell high grade B-cell lymphoma treated with 6 cycles of R-CHOP ending on 09/08/1998 resulting in a complete remission.  She had a mammogram in Dec 2015 recommending a short-interval follow-up mammogram 6 months later.  This was not done.  Order placed for B/L diagnostic mammogram with Ultrasounds to be done this month.

## 2015-12-25 NOTE — Assessment & Plan Note (Signed)
Intermittent iron deficiency for which she received intravenous Feraheme.  Labs today: CBC diff, iron/TIBC, ferritin.

## 2015-12-25 NOTE — Patient Instructions (Signed)
[place] at [facility] Discharge Instructions  RECOMMENDATIONS MADE BY THE CONSULTANT AND ANY TEST RESULTS WILL BE SENT TO YOUR REFERRING PHYSICIAN.  Your platelet count is improved, but not yet at goal. INCREASE your Hydrea dose to 1 capsule Monday-Saturday. Labs every 3 weeks. Return in ~ 2 months for follow-up. A new prescription for your Jannette Fogo is sent to your pharmacy, [facility].  Thank you for choosing [place] at [facility] to provide your oncology and hematology care.  To afford each patient quality time with our provider, please arrive at least 15 minutes before your scheduled appointment time.   Beginning January 23rd 2017 lab work for the [facility] will be done in the  Main lab at [facility] on 1st floor. If you have a lab appointment with the [facility] please come in thru the  Main Entrance and check in at the main information desk  You need to re-schedule your appointment should you arrive 10 or more minutes late.  We strive to give you quality time with our providers, and arriving late affects you and other patients whose appointments are after yours.  Also, if you no show three or more times for appointments you may be dismissed from the clinic at the providers discretion.     Again, thank you for choosing [facility].  Our hope is that these requests will decrease the amount of time that you wait before being seen by our physicians.       _____________________________________________________________  Should you have questions after your visit to [facility], please contact our office at [phone] between the hours of 8:30 a.m. and 4:30 p.m.  Voicemails left after 4:30 p.m. will not be returned until the following business day.  For prescription refill requests, have your pharmacy contact our office.         Resources For Cancer Patients and their Caregivers ? American  Cancer Society: Can assist with transportation, wigs, general needs, runs Look Good Feel Better.        769 404 0960 ? Cancer Care: Provides financial assistance, online support groups, medication/co-pay assistance.  1-800-813-HOPE 502-191-2184) ? [facility] Assists [place] cancer patients and their families through emotional , educational and financial support.  302-438-8943 ? [place] DSS Where to apply for food stamps, Medicaid and utility assistance. 671-170-8967 ? [employer]: Transportation to medical appointments. 980-645-6826 ? Social Security Administration: May apply for disability if have a Stage IV cancer. 251-442-5178 (636)669-5059 ? [employer], Disability and Transit Services: Assists with nutrition, care and transit needs. [place] Support Programs: @10RELATIVEDAYS @ > Cancer Support Group  2nd Tuesday of the month 1pm-2pm, Journey Room  > Creative Journey  3rd Tuesday of the month 1130am-1pm, Journey Room  > Look Good Feel Better  1st Wednesday of the month 10am-12 noon, Journey Room (Call [place] to register [phone])

## 2015-12-30 DIAGNOSIS — I739 Peripheral vascular disease, unspecified: Secondary | ICD-10-CM | POA: Diagnosis not present

## 2015-12-30 DIAGNOSIS — B351 Tinea unguium: Secondary | ICD-10-CM | POA: Diagnosis not present

## 2016-01-08 ENCOUNTER — Other Ambulatory Visit (HOSPITAL_COMMUNITY): Payer: Medicare Other

## 2016-01-08 ENCOUNTER — Ambulatory Visit (HOSPITAL_COMMUNITY): Payer: Medicare Other | Admitting: Oncology

## 2016-01-10 ENCOUNTER — Other Ambulatory Visit (HOSPITAL_COMMUNITY): Payer: Self-pay | Admitting: Oncology

## 2016-01-10 DIAGNOSIS — E876 Hypokalemia: Secondary | ICD-10-CM

## 2016-01-13 ENCOUNTER — Encounter: Payer: Self-pay | Admitting: Orthopedic Surgery

## 2016-01-13 ENCOUNTER — Ambulatory Visit (INDEPENDENT_AMBULATORY_CARE_PROVIDER_SITE_OTHER): Payer: Medicare Other | Admitting: Orthopedic Surgery

## 2016-01-13 DIAGNOSIS — M75101 Unspecified rotator cuff tear or rupture of right shoulder, not specified as traumatic: Secondary | ICD-10-CM

## 2016-01-13 DIAGNOSIS — M19011 Primary osteoarthritis, right shoulder: Secondary | ICD-10-CM

## 2016-01-13 MED ORDER — DICLOFENAC POTASSIUM 50 MG PO TABS: 50.0000 mg | ORAL_TABLET | Freq: Two times a day (BID) | ORAL | 2 refills | Status: DC

## 2016-01-13 NOTE — Progress Notes (Signed)
Patient ID: Leslie Williamson, female   DOB: 02/05/1932, 82 y.o.   MRN: [id number]  Chief Complaint  Patient presents with  . Follow-up    Right shoulder    HPI Leslie Williamson is a 82 y.o. female.   HPI  Treated with injection in the subacromial space, occupational therapy and diclofenac. Presents with excellent  improvement in her pain  Review of Systems Review of Systems  Pain from bruise right arm after fall   Physical Exam There were no vitals taken for this visit.   Physical Exam  Right shoulder has a bruise over the right arm actually. It's tender. She has full range of motion shoulder stable strength return to normal skin intact pulses good sensation normal lymph nodes negative  Symptoms resolve  Take diclofenac as needed  Meds ordered this encounter  Medications  . diclofenac (CATAFLAM) 50 MG tablet    Sig: Take 1 tablet (50 mg total) by mouth 2 (two) times daily.    Dispense:  60 tablet    Refill:  2    Fu prn

## 2016-01-13 NOTE — Patient Instructions (Signed)
Take medication as needed  

## 2016-01-14 ENCOUNTER — Encounter (HOSPITAL_COMMUNITY): Payer: Medicare Other

## 2016-01-14 DIAGNOSIS — D471 Chronic myeloproliferative disease: Secondary | ICD-10-CM | POA: Diagnosis not present

## 2016-01-14 LAB — COMPREHENSIVE METABOLIC PANEL
ALK PHOS: 61 U/L (ref 38–126)
ALT: 11 U/L — AB (ref 14–54)
AST: 14 U/L — ABNORMAL LOW (ref 15–41)
Albumin: 3.7 g/dL (ref 3.5–5.0)
Anion gap: 8 (ref 5–15)
BUN: 16 mg/dL (ref 6–20)
CALCIUM: 9 mg/dL (ref 8.9–10.3)
CO2: 23 mmol/L (ref 22–32)
CREATININE: 0.98 mg/dL (ref 0.44–1.00)
Chloride: 106 mmol/L (ref 101–111)
GFR calc non Af Amer: 52 mL/min — ABNORMAL LOW (ref >60)
Glucose, Bld: 82 mg/dL (ref 65–99)
Potassium: 3.9 mmol/L (ref 3.5–5.1)
SODIUM: 137 mmol/L (ref 135–145)
Total Bilirubin: 0.6 mg/dL (ref 0.3–1.2)
Total Protein: 7 g/dL (ref 6.5–8.1)

## 2016-01-14 LAB — CBC WITH DIFFERENTIAL/PLATELET
Basophils Absolute: 0 10*3/uL (ref 0.0–0.1)
Basophils Relative: 0 %
EOS ABS: 0.1 10*3/uL (ref 0.0–0.7)
Eosinophils Relative: 1 %
HCT: 37.7 % (ref 36.0–46.0)
HEMOGLOBIN: 12.7 g/dL (ref 12.0–15.0)
LYMPHS ABS: 3.6 10*3/uL (ref 0.7–4.0)
Lymphocytes Relative: 36 %
MCH: 35.8 pg — AB (ref 26.0–34.0)
MCHC: 33.7 g/dL (ref 30.0–36.0)
MCV: 106.2 fL — AB (ref 78.0–100.0)
Monocytes Absolute: 0.8 10*3/uL (ref 0.1–1.0)
Monocytes Relative: 8 %
NEUTROS PCT: 55 %
Neutro Abs: 5.5 10*3/uL (ref 1.7–7.7)
Platelets: 432 10*3/uL — ABNORMAL HIGH (ref 150–400)
RBC: 3.55 MIL/uL — AB (ref 3.87–5.11)
RDW: 17.2 % — ABNORMAL HIGH (ref 11.5–15.5)
WBC: 10.1 10*3/uL (ref 4.0–10.5)

## 2016-01-20 ENCOUNTER — Other Ambulatory Visit (HOSPITAL_COMMUNITY): Payer: Self-pay | Admitting: Surgery

## 2016-01-20 DIAGNOSIS — I739 Peripheral vascular disease, unspecified: Secondary | ICD-10-CM | POA: Diagnosis not present

## 2016-01-27 ENCOUNTER — Ambulatory Visit (HOSPITAL_COMMUNITY)
Admission: RE | Admit: 2016-01-27 | Discharge: 2016-01-27 | Disposition: A | Discharge status: Home / Self Care | Payer: Medicare Other | Source: Ambulatory Visit | Attending: Surgery | Admitting: Surgery

## 2016-01-27 DIAGNOSIS — I10 Essential (primary) hypertension: Secondary | ICD-10-CM | POA: Insufficient documentation

## 2016-01-27 DIAGNOSIS — E785 Hyperlipidemia, unspecified: Secondary | ICD-10-CM | POA: Diagnosis not present

## 2016-01-27 DIAGNOSIS — I739 Peripheral vascular disease, unspecified: Secondary | ICD-10-CM | POA: Diagnosis not present

## 2016-02-03 DIAGNOSIS — B351 Tinea unguium: Secondary | ICD-10-CM | POA: Diagnosis not present

## 2016-02-03 DIAGNOSIS — M79673 Pain in unspecified foot: Secondary | ICD-10-CM | POA: Diagnosis not present

## 2016-02-03 DIAGNOSIS — E1151 Type 2 diabetes mellitus with diabetic peripheral angiopathy without gangrene: Secondary | ICD-10-CM | POA: Diagnosis not present

## 2016-02-03 DIAGNOSIS — L851 Acquired keratosis [keratoderma] palmaris et plantaris: Secondary | ICD-10-CM | POA: Diagnosis not present

## 2016-02-05 ENCOUNTER — Encounter (HOSPITAL_COMMUNITY): Payer: Medicare Other | Attending: Hematology & Oncology

## 2016-02-05 DIAGNOSIS — D471 Chronic myeloproliferative disease: Secondary | ICD-10-CM | POA: Insufficient documentation

## 2016-02-05 LAB — CBC WITH DIFFERENTIAL/PLATELET
BASOS ABS: 0 10*3/uL (ref 0.0–0.1)
BASOS PCT: 0 %
EOS ABS: 0.1 10*3/uL (ref 0.0–0.7)
Eosinophils Relative: 1 %
HCT: 36.6 % (ref 36.0–46.0)
HEMOGLOBIN: 12.1 g/dL (ref 12.0–15.0)
Lymphocytes Relative: 48 %
Lymphs Abs: 3.5 10*3/uL (ref 0.7–4.0)
MCH: 36.2 pg — ABNORMAL HIGH (ref 26.0–34.0)
MCHC: 33.1 g/dL (ref 30.0–36.0)
MCV: 109.6 fL — ABNORMAL HIGH (ref 78.0–100.0)
MONOS PCT: 13 %
Monocytes Absolute: 1 10*3/uL (ref 0.1–1.0)
NEUTROS PCT: 38 %
Neutro Abs: 2.8 10*3/uL (ref 1.7–7.7)
Platelets: 405 10*3/uL — ABNORMAL HIGH (ref 150–400)
RBC: 3.34 MIL/uL — AB (ref 3.87–5.11)
RDW: 16.9 % — ABNORMAL HIGH (ref 11.5–15.5)
WBC: 7.3 10*3/uL (ref 4.0–10.5)

## 2016-02-11 ENCOUNTER — Other Ambulatory Visit (HOSPITAL_COMMUNITY): Payer: Self-pay

## 2016-02-11 DIAGNOSIS — E876 Hypokalemia: Secondary | ICD-10-CM

## 2016-02-11 DIAGNOSIS — C8330 Diffuse large B-cell lymphoma, unspecified site: Secondary | ICD-10-CM

## 2016-02-11 MED ORDER — POTASSIUM CHLORIDE CRYS ER 20 MEQ PO TBCR: 20.0000 meq | EXTENDED_RELEASE_TABLET | Freq: Every day | ORAL | 0 refills | Status: DC

## 2016-02-11 NOTE — Telephone Encounter (Signed)
Received refill request from patients pharmacy for 90 day supply of potassium. Reviewed with PA-C, who okayed increase in supply. New script sent to pharmacy.

## 2016-02-24 ENCOUNTER — Other Ambulatory Visit (HOSPITAL_COMMUNITY): Payer: Self-pay | Admitting: *Deleted

## 2016-02-24 DIAGNOSIS — C8330 Diffuse large B-cell lymphoma, unspecified site: Secondary | ICD-10-CM

## 2016-02-26 ENCOUNTER — Ambulatory Visit (HOSPITAL_COMMUNITY): Payer: Medicare Other | Admitting: Oncology

## 2016-02-26 ENCOUNTER — Encounter (HOSPITAL_COMMUNITY): Payer: Medicare Other | Attending: Hematology & Oncology

## 2016-02-26 DIAGNOSIS — C8330 Diffuse large B-cell lymphoma, unspecified site: Secondary | ICD-10-CM | POA: Insufficient documentation

## 2016-02-26 DIAGNOSIS — D509 Iron deficiency anemia, unspecified: Secondary | ICD-10-CM | POA: Insufficient documentation

## 2016-02-26 LAB — FERRITIN: Ferritin: 13 ng/mL (ref 11–307)

## 2016-02-26 LAB — COMPREHENSIVE METABOLIC PANEL
ALBUMIN: 3.7 g/dL (ref 3.5–5.0)
ALK PHOS: 65 U/L (ref 38–126)
ALT: 11 U/L — AB (ref 14–54)
ANION GAP: 7 (ref 5–15)
AST: 17 U/L (ref 15–41)
BILIRUBIN TOTAL: 0.6 mg/dL (ref 0.3–1.2)
BUN: 17 mg/dL (ref 6–20)
CALCIUM: 9.3 mg/dL (ref 8.9–10.3)
CO2: 25 mmol/L (ref 22–32)
Chloride: 105 mmol/L (ref 101–111)
Creatinine, Ser: 1.14 mg/dL — ABNORMAL HIGH (ref 0.44–1.00)
GFR calc Af Amer: 50 mL/min — ABNORMAL LOW (ref >60)
GFR calc non Af Amer: 44 mL/min — ABNORMAL LOW (ref >60)
GLUCOSE: 185 mg/dL — AB (ref 65–99)
Potassium: 3.8 mmol/L (ref 3.5–5.1)
SODIUM: 137 mmol/L (ref 135–145)
Total Protein: 7 g/dL (ref 6.5–8.1)

## 2016-02-26 LAB — CBC WITH DIFFERENTIAL/PLATELET
BASOS ABS: 0 10*3/uL (ref 0.0–0.1)
BASOS PCT: 0 %
EOS ABS: 0.1 10*3/uL (ref 0.0–0.7)
Eosinophils Relative: 1 %
HEMATOCRIT: 38.7 % (ref 36.0–46.0)
HEMOGLOBIN: 12.7 g/dL (ref 12.0–15.0)
Lymphocytes Relative: 44 %
Lymphs Abs: 3.2 10*3/uL (ref 0.7–4.0)
MCH: 36.6 pg — ABNORMAL HIGH (ref 26.0–34.0)
MCHC: 32.8 g/dL (ref 30.0–36.0)
MCV: 111.5 fL — ABNORMAL HIGH (ref 78.0–100.0)
Monocytes Absolute: 0.5 10*3/uL (ref 0.1–1.0)
Monocytes Relative: 7 %
NEUTROS ABS: 3.4 10*3/uL (ref 1.7–7.7)
NEUTROS PCT: 48 %
Platelets: 415 10*3/uL — ABNORMAL HIGH (ref 150–400)
RBC: 3.47 MIL/uL — AB (ref 3.87–5.11)
RDW: 16.2 % — ABNORMAL HIGH (ref 11.5–15.5)
WBC: 7.2 10*3/uL (ref 4.0–10.5)

## 2016-02-26 LAB — IRON AND TIBC
Iron: 71 ug/dL (ref 28–170)
SATURATION RATIOS: 19 % (ref 10.4–31.8)
TIBC: 375 ug/dL (ref 250–450)
UIBC: 304 ug/dL

## 2016-02-26 NOTE — Assessment & Plan Note (Deleted)
JAK2 V617F positive myeloproliferative disorder presenting with thrombocytosis, with preservation of HGB/RBC/HCT and intermittent leukocytosis (lymphocyte predominance).  On Hydrea 500 mg Monday- Saturday.  Labs today: CBC diff, CMET.  I personally reviewed and went over laboratory results with the patient.  The results are noted within this dictation.    Labs every 4 weeks: CBC diff.  Labs every 8 weeks: CBC diff, CMET.  Continue ASA therapy as prescribed.  She is overdue for mammogram.  Order placed for mammogram.  Return in 12 weeks for follow-up.

## 2016-02-26 NOTE — Progress Notes (Signed)
No show Review of Systems   

## 2016-02-26 NOTE — Assessment & Plan Note (Deleted)
Intermittent iron deficiency for which she received intravenous Feraheme.  Labs today: CBC diff, iron/TIBC, ferritin.

## 2016-02-27 ENCOUNTER — Other Ambulatory Visit (HOSPITAL_COMMUNITY): Payer: Self-pay | Admitting: *Deleted

## 2016-02-27 ENCOUNTER — Telehealth (HOSPITAL_COMMUNITY): Payer: Self-pay | Admitting: *Deleted

## 2016-02-27 DIAGNOSIS — D471 Chronic myeloproliferative disease: Secondary | ICD-10-CM

## 2016-02-27 MED ORDER — HYDROXYUREA 500 MG PO CAPS
ORAL_CAPSULE | ORAL | 2 refills | Status: DC
Start: 2016-02-27 — End: 2016-04-15

## 2016-02-27 NOTE — Telephone Encounter (Signed)
See telephone encounter.

## 2016-03-01 DIAGNOSIS — J42 Unspecified chronic bronchitis: Secondary | ICD-10-CM | POA: Diagnosis not present

## 2016-03-08 ENCOUNTER — Ambulatory Visit (HOSPITAL_COMMUNITY)
Admission: RE | Admit: 2016-03-08 | Discharge: 2016-03-08 | Disposition: A | Discharge status: Home / Self Care | Payer: Medicare Other | Source: Ambulatory Visit | Attending: Internal Medicine | Admitting: Internal Medicine

## 2016-03-08 ENCOUNTER — Other Ambulatory Visit (HOSPITAL_COMMUNITY): Payer: Self-pay | Admitting: Internal Medicine

## 2016-03-08 DIAGNOSIS — I517 Cardiomegaly: Secondary | ICD-10-CM | POA: Diagnosis not present

## 2016-03-08 DIAGNOSIS — J9811 Atelectasis: Secondary | ICD-10-CM | POA: Insufficient documentation

## 2016-03-08 DIAGNOSIS — J449 Chronic obstructive pulmonary disease, unspecified: Secondary | ICD-10-CM | POA: Diagnosis not present

## 2016-03-08 DIAGNOSIS — R059 Cough, unspecified: Secondary | ICD-10-CM

## 2016-03-08 DIAGNOSIS — R0989 Other specified symptoms and signs involving the circulatory and respiratory systems: Secondary | ICD-10-CM

## 2016-03-08 DIAGNOSIS — R05 Cough: Secondary | ICD-10-CM | POA: Diagnosis not present

## 2016-03-09 DIAGNOSIS — J209 Acute bronchitis, unspecified: Secondary | ICD-10-CM | POA: Diagnosis not present

## 2016-03-30 ENCOUNTER — Other Ambulatory Visit (HOSPITAL_COMMUNITY): Payer: Medicare Other

## 2016-03-30 ENCOUNTER — Ambulatory Visit (HOSPITAL_COMMUNITY): Payer: Medicare Other | Admitting: Adult Health

## 2016-04-05 ENCOUNTER — Other Ambulatory Visit (HOSPITAL_COMMUNITY): Payer: Self-pay | Admitting: Oncology

## 2016-04-05 DIAGNOSIS — E876 Hypokalemia: Secondary | ICD-10-CM

## 2016-04-14 NOTE — Progress Notes (Signed)
Leslie Williamson, [place] [address]   CLINIC:  Medical Oncology/Hematology  PCP:  Rosita Fire, MD [place] [address] [phone]   REASON FOR VISIT:  Follow-up for myeloproliferative disorder/thrombocytosis, JAK2+ AND Diffuse Large B-Cell Lymphoma AND Iron deficiency anemia   CURRENT THERAPY: Hydrea 500 mg Monday-Saturday AND Surveillance AND IV iron prn    BRIEF ONCOLOGIC HISTORY:    NHL (non-Hodgkin's lymphoma) ([place])   07/19/1995 Pathology Results    Spleen biopsy with resection- low-grade B-cell lymphoma, splenic marginal zone type      02/20/1998 Imaging    CT CAP-marked and extensive cervical adenopathy      02/25/1998 Pathology Results    Right cervical lymph node demonstrating large B-cell lymphoma      03/05/1998 Bone Marrow Biopsy    No evidence of bone marrow involvement      03/11/1998 - 07/18/1998 Chemotherapy    CHOP x 7 cycles      05/09/1998 Imaging    CT CAP and neck- slight interval decrease in size of para-aortic adenopathy with interval decrease in right inguinal and bi-iliac adenopathy.  Interval decrease in size and number of enlarged cervical nodes      07/10/1998 Imaging    Ct CAP and neck- unremarkable CT of chest. No enlarged retroperitoneal adenopathy withthe previously noted retroperitoneal nodes currently smaller to stable in size.  Cervical adenopathy is stable to slightly smaller in size.      08/18/1998 - 09/08/1998 Chemotherapy    Rituxan Day 1, 8, 15, 22 x 1 cycle      01/08/1999 Remission    CT CAP and neck demonstrates no adenopathy or disease      01/08/1999 Imaging    CT CAP and neck- Stable CT of chest. Stable CT abd/pelvis without adenopathy.  Negative CT of neck        HISTORY OF PRESENT ILLNESS:  (From Kirby Crigler, PA-C's last note on 11/Williamson/17)     INTERVAL HISTORY:  Leslie Williamson presents to the cancer center for ongoing follow-up for Leslie Williamson positive  myeloproliferative disorder with thrombocytosis, low-grade diffuse large B-cell lymphoma, and iron deficiency anemia.  She tells me she has been overall feeling quite well. She does have decreased energy, "but I feel fine." She denies any active bleeding including blood in her stools, melena, nosebleeds, or bleeding from her gums. She remains on Hydrea 500 mg taken 6 days a week on Monday through Saturday. Her appetite is good.  Endorses recent cold with cough, but the symptoms have now resolved. Otherwise she is largely without complaints today.   REVIEW OF SYSTEMS:  Review of Systems  Constitutional: Positive for fatigue. Negative for chills and fever.  HENT:  Negative.  Negative for lump/mass and nosebleeds.   Eyes: Negative.   Respiratory: Negative.  Negative for cough and shortness of breath.   Cardiovascular: Negative.  Negative for chest pain and leg swelling.  Gastrointestinal: Negative.  Negative for abdominal pain, blood in stool, constipation, diarrhea, nausea and vomiting.  Endocrine: Negative.   Genitourinary: Negative.  Negative for dysuria and hematuria.   Musculoskeletal: Negative.  Negative for arthralgias.  Skin: Negative.  Negative for rash.  Neurological: Negative.  Negative for dizziness and headaches.  Hematological: Negative.  Negative for adenopathy. Does not bruise/bleed easily.  Psychiatric/Behavioral: Negative.  Negative for depression and sleep disturbance. The patient is not nervous/anxious.      PAST MEDICAL/SURGICAL HISTORY:  Past Medical History:  Diagnosis Date  .  Anxiety   . Complication of anesthesia    difficult to wake up  . Depression   . Hypercholesteremia   . Hypertension   . Hypokalemia   . Iron deficiency anemia   . Left hip pain   . NHL (non-Hodgkin's lymphoma) ([place]) 12/17/2010  . Obesity   . Primary thrombocytosis ([place]) 01/11/2006   Secondary to splenectomy    . PVD (peripheral vascular disease) ([facility] [facility])   . S/P splenectomy 01/01/2014   . 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 Vertigo, labyrinthine    Past Surgical History:  Procedure Laterality Date  . CATARACT EXTRACTION W/PHACO Left 11/03/2015   Procedure: CATARACT EXTRACTION PHACO AND INTRAOCULAR LENS PLACEMENT (IOC);  Surgeon: Tonny Branch, MD;  Location: [facility];  Service: Ophthalmology;  Laterality: Left;  CDE: 7.74  . CATARACT EXTRACTION W/PHACO Right 11/20/2015   Procedure: CATARACT EXTRACTION PHACO AND INTRAOCULAR LENS PLACEMENT ; CDE:  8.30;  Surgeon: Tonny Branch, MD;  Location: [facility];  Service: Ophthalmology;  Laterality: Right;  . PORT-A-CATH REMOVAL       SOCIAL HISTORY:  Social History   Social History  . Marital status: Widowed    Spouse name: N/A  . Number of children: N/A  . Years of education: N/A   Occupational History  . Not on file.   Social History Main Topics  . Smoking status: Former Research scientist (life sciences)  . Smokeless tobacco: Never Used  . Alcohol use No  . Drug use: No  . Sexual activity: No   Other Topics Concern  . Not on file   Social History Narrative  . No narrative on file    FAMILY HISTORY:  Family History  Problem Relation Age of Onset  . Diabetes Mother     CURRENT MEDICATIONS:  Outpatient Encounter Prescriptions as of Williamson/22/2018  Medication Sig Note  . amLODipine (NORVASC) Williamson.5 MG tablet Take Williamson.5 mg by mouth daily. 01/10/2015: Received from: External Pharmacy Received Sig:   . aspirin 81 MG tablet Take 81 mg by mouth daily.   Marland Kitchen atorvastatin (LIPITOR) 40 MG tablet Take 40 mg by mouth daily. 01/10/2015: Received from: External Pharmacy Received Sig:   . cilostazol (PLETAL) 100 MG tablet Take 100 mg by mouth Williamson (two) times daily.   . diclofenac (CATAFLAM) 50 MG tablet Take 1 tablet (50 mg total) by mouth Williamson (two) times daily.   . hydroxyurea (HYDREA) 500 MG capsule Take 500 mg PO daily.  May take with food to minimize GI side effects.   Marland Kitchen ibuprofen  (ADVIL,MOTRIN) 800 MG tablet Take 800 mg by mouth every 8 (eight) hours as needed for moderate pain.  03/11/2015: Received from: External Pharmacy  . losartan-hydrochlorothiazide (HYZAAR) 100-12.5 MG per tablet Take 1 tablet by mouth daily. Reported on 06/30/2015   . meclizine (ANTIVERT) 25 MG tablet Take 1 tablet (25 mg total) by mouth 3 (three) times daily as needed for dizziness.   . potassium chloride SA (K-DUR,KLOR-CON) 20 MEQ tablet TAKE ONE TABLET BY MOUTH DAILY.   . [DISCONTINUED] hydroxyurea (HYDREA) 500 MG capsule Take 500 mg PO Monday- Saturday.  May take with food to minimize GI side effects.    No facility-administered encounter medications on file as of Williamson/22/2018.     ALLERGIES:  Allergies  Allergen Reactions  . Penicillins Rash     PHYSICAL EXAM:  ECOG Performance status: 1 - Symptomatic, but independent.   Vitals:   04/15/16 1016  BP: 128/70  Pulse: 81  Resp: 18  Temp: 97.7 F (36.5 C)   Filed Weights   04/15/16 1016  Weight: 165 lb 11.Williamson oz (75.Williamson kg)    Physical Exam  Constitutional: She is oriented to person, place, and time and well-developed, well-nourished, and in no distress.  HENT:  Head: Normocephalic.  Mouth/Throat: Oropharynx is clear and moist. No oropharyngeal exudate.  Eyes: Conjunctivae are normal. Pupils are equal, round, and reactive to light. No scleral icterus.  Neck: Normal range of motion. Neck supple.  Cardiovascular: Normal rate, regular rhythm and normal heart sounds.   Pulmonary/Chest: Effort normal and breath sounds normal. No respiratory distress.  Abdominal: Soft. Bowel sounds are normal. There is no tenderness.  Musculoskeletal: Normal range of motion. She exhibits no edema.  Lymphadenopathy:    She has no cervical adenopathy.       Right: No supraclavicular adenopathy present.       Left: No supraclavicular adenopathy present.  Neurological: She is alert and oriented to person, place, and time. No cranial nerve deficit. Gait  normal.  Skin: Skin is warm and dry. No rash noted.  Psychiatric: Mood, memory, affect and judgment normal.  Nursing note and vitals reviewed.    LABORATORY DATA:  I have reviewed the labs as listed.  CBC    Component Value Date/Time   WBC 8.7 04/15/2016 0933   RBC 3.51 (L) 04/15/2016 0933   HGB 12.7 04/15/2016 0933   HCT 38.1 04/15/2016 0933   PLT 550 (H) 04/15/2016 0933   MCV 108.5 (H) 04/15/2016 0933   MCH 36.Williamson (H) 04/15/2016 0933   MCHC 33.3 04/15/2016 0933   RDW 15.6 (H) 04/15/2016 0933   LYMPHSABS 3.8 04/15/2016 0933   MONOABS 0.9 04/15/2016 0933   EOSABS 0.0 04/15/2016 0933   BASOSABS 0.0 04/15/2016 0933   CMP Latest Ref Rng & Units Williamson/22/2018 02/26/2016 01/14/2016  Glucose 65 - 99 mg/dL 124(H) 185(H) 82  BUN 6 - 20 mg/dL 11 17 16   Creatinine 0.44 - 1.00 mg/dL 0.92 1.14(H) 0.98  Sodium 135 - 145 mmol/L 136 137 137  Potassium 3.5 - 5.1 mmol/L 3.7 3.8 3.9  Chloride 101 - 111 mmol/L 104 105 106  CO2 22 - 32 mmol/L 24 25 23   Calcium 8.9 - 10.3 mg/dL 9.0 9.3 9.0  Total Protein 6.5 - 8.1 g/dL 6.8 7.0 7.0  Total Bilirubin 0.3 - 1.Williamson mg/dL 0.6 0.6 0.6  Alkaline Phos 38 - 126 U/L 60 65 61  AST 15 - 41 U/L 19 17 14(L)  ALT 14 - 54 U/L 11(L) 11(L) 11(L)    PENDING LABS:    DIAGNOSTIC IMAGING:    PATHOLOGY:  Colonoscopy: 11/01/08    ASSESSMENT & PLAN:   Myeloproliferative disorder/thrombocytosis:  -Thrombocytosis likely secondary to previous splenectomy and JAK2 mutation.  -Platelet count 550,000 today. Patient has been compliant with previously prescribed regimen of Hydrea 500 mg Monday-Saturday each week.  -Discussed her case with Dr. Talbert Cage. We will increase the frequency of her Hydrea to daily [facility]).  She was given verbal and written instructions with this plan. She agrees.  Hydrea refills sent to [facility] in 1 month. Return to cancer center for follow-up visit in 3 months.   Iron deficiency anemia:  -She has some fatigue, but this is  chronic. Denies any blood in her stools or melena. Last colonoscopy was in 10/2008; tubular adenoma was noted, but there was no high grade dysplasia or malignancy.  -Iron studies were not  collected today prior to her visit.  I will add on anemia panel when she comes back in 1 month for labs.   -Will continue with IV iron infusion as needed.   Diffuse large B-cell lymphoma:  -Diagnosed in 1999; s/p CHOP chemotherapy x 7 cycles, followed by Rituxan x 1 cycle. Post-treatment CT scans showed complete response.  -No clinical evidence of recurrent disease on physical exam; no lymphadenopathy appreciated. Imaging not necessary. We will continue to monitor with physical exam and labs per NCCN Guidelines.         Dispo:  -Return for labs only in 1 month. -Return to cancer center in 3 months for continued follow-up.    All questions were answered to patient's stated satisfaction. Encouraged patient to call with any new concerns or questions before her next visit to the cancer center and we can certain see her sooner, if needed.    Plan of care discussed with Dr. Talbert Cage, who agrees with the above aforementioned.    Orders placed this encounter:  No orders of the defined types were placed in this encounter.     Mike Craze, NP [place] [phone]

## 2016-04-15 ENCOUNTER — Encounter (HOSPITAL_COMMUNITY): Payer: Self-pay | Admitting: Adult Health

## 2016-04-15 ENCOUNTER — Encounter (HOSPITAL_COMMUNITY): Payer: Medicare Other | Attending: Oncology | Admitting: Adult Health

## 2016-04-15 ENCOUNTER — Encounter (HOSPITAL_COMMUNITY): Payer: Medicare Other

## 2016-04-15 VITALS — BP 128/70 | HR 81 | Temp 97.7°F | Resp 18 | Wt 165.7 lb

## 2016-04-15 DIAGNOSIS — C8330 Diffuse large B-cell lymphoma, unspecified site: Secondary | ICD-10-CM

## 2016-04-15 DIAGNOSIS — R5382 Chronic fatigue, unspecified: Secondary | ICD-10-CM

## 2016-04-15 DIAGNOSIS — Z8572 Personal history of non-Hodgkin lymphomas: Secondary | ICD-10-CM | POA: Diagnosis not present

## 2016-04-15 DIAGNOSIS — D509 Iron deficiency anemia, unspecified: Secondary | ICD-10-CM | POA: Diagnosis not present

## 2016-04-15 DIAGNOSIS — D471 Chronic myeloproliferative disease: Secondary | ICD-10-CM | POA: Diagnosis not present

## 2016-04-15 DIAGNOSIS — D47Z9 Other specified neoplasms of uncertain behavior of lymphoid, hematopoietic and related tissue: Secondary | ICD-10-CM | POA: Diagnosis not present

## 2016-04-15 DIAGNOSIS — D473 Essential (hemorrhagic) thrombocythemia: Secondary | ICD-10-CM

## 2016-04-15 LAB — CBC WITH DIFFERENTIAL/PLATELET
BASOS ABS: 0 10*3/uL (ref 0.0–0.1)
BASOS PCT: 0 %
EOS ABS: 0 10*3/uL (ref 0.0–0.7)
Eosinophils Relative: 1 %
HEMATOCRIT: 38.1 % (ref 36.0–46.0)
Hemoglobin: 12.7 g/dL (ref 12.0–15.0)
Lymphocytes Relative: 44 %
Lymphs Abs: 3.8 10*3/uL (ref 0.7–4.0)
MCH: 36.2 pg — ABNORMAL HIGH (ref 26.0–34.0)
MCHC: 33.3 g/dL (ref 30.0–36.0)
MCV: 108.5 fL — ABNORMAL HIGH (ref 78.0–100.0)
MONO ABS: 0.9 10*3/uL (ref 0.1–1.0)
MONOS PCT: 11 %
Neutro Abs: 3.9 10*3/uL (ref 1.7–7.7)
Neutrophils Relative %: 44 %
PLATELETS: 550 10*3/uL — AB (ref 150–400)
RBC: 3.51 MIL/uL — ABNORMAL LOW (ref 3.87–5.11)
RDW: 15.6 % — AB (ref 11.5–15.5)
WBC: 8.7 10*3/uL (ref 4.0–10.5)

## 2016-04-15 LAB — COMPREHENSIVE METABOLIC PANEL
ALBUMIN: 3.6 g/dL (ref 3.5–5.0)
ALT: 11 U/L — ABNORMAL LOW (ref 14–54)
ANION GAP: 8 (ref 5–15)
AST: 19 U/L (ref 15–41)
Alkaline Phosphatase: 60 U/L (ref 38–126)
BILIRUBIN TOTAL: 0.6 mg/dL (ref 0.3–1.2)
BUN: 11 mg/dL (ref 6–20)
CHLORIDE: 104 mmol/L (ref 101–111)
CO2: 24 mmol/L (ref 22–32)
Calcium: 9 mg/dL (ref 8.9–10.3)
Creatinine, Ser: 0.92 mg/dL (ref 0.44–1.00)
GFR calc Af Amer: >60 mL/min (ref >60)
GFR calc non Af Amer: 56 mL/min — ABNORMAL LOW (ref >60)
GLUCOSE: 124 mg/dL — AB (ref 65–99)
POTASSIUM: 3.7 mmol/L (ref 3.5–5.1)
SODIUM: 136 mmol/L (ref 135–145)
TOTAL PROTEIN: 6.8 g/dL (ref 6.5–8.1)

## 2016-04-15 MED ORDER — HYDROXYUREA 500 MG PO CAPS: ORAL_CAPSULE | ORAL | 2 refills | Status: DC

## 2016-04-15 NOTE — Patient Instructions (Addendum)
[place] at [facility] Discharge Instructions  RECOMMENDATIONS MADE BY THE CONSULTANT AND ANY TEST RESULTS WILL BE SENT TO YOUR REFERRING PHYSICIAN.  Exam with Mike Craze, NP. We want you to change to taking your Hydrea to 7 days a week. We will check your labs in one month. Return to the clinic in three months for follow up. Please see Amy as you leave for appointments.    Thank you for choosing [place] at [facility] to provide your oncology and hematology care.  To afford each patient quality time with our provider, please arrive at least 15 minutes before your scheduled appointment time.    If you have a lab appointment with the [facility] please come in thru the  Main Entrance and check in at the main information desk  You need to re-schedule your appointment should you arrive 10 or more minutes late.  We strive to give you quality time with our providers, and arriving late affects you and other patients whose appointments are after yours.  Also, if you no show three or more times for appointments you may be dismissed from the clinic at the providers discretion.     Again, thank you for choosing [facility].  Our hope is that these requests will decrease the amount of time that you wait before being seen by our physicians.       _____________________________________________________________  Should you have questions after your visit to [facility], please contact our office at [phone] between the hours of 8:30 a.m. and 4:30 p.m.  Voicemails left after 4:30 p.m. will not be returned until the following business day.  For prescription refill requests, have your pharmacy contact our office.       Resources For Cancer Patients and their Caregivers ? American Cancer Society: Can assist with transportation, wigs, general needs, runs Look Good Feel Better.        (534)730-7777 ? Cancer  Care: Provides financial assistance, online support groups, medication/co-pay assistance.  1-800-813-HOPE 916 480 8620) ? [place] Assists [place] cancer patients and their families through emotional , educational and financial support.  (418) 165-9757 ? [place] DSS Where to apply for food stamps, Medicaid and utility assistance. (605) 373-3139 ? [facility]: Transportation to medical appointments. 406 328 5021 ? Social Security Administration: May apply for disability if have a Stage IV cancer. 952-782-3611 404 076 6659 ? [employer], Disability and Transit Services: Assists with nutrition, care and transit needs. [place] Support Programs: @10RELATIVEDAYS @ > Cancer Support Group  2nd Tuesday of the month 1pm-2pm, Journey Room  > Creative Journey  3rd Tuesday of the month 1130am-1pm, Journey Room  > Look Good Feel Better  1st Wednesday of the month 10am-12 noon, Journey Room (Call [place] to register [phone])

## 2016-05-10 DIAGNOSIS — M79674 Pain in right toe(s): Secondary | ICD-10-CM | POA: Diagnosis not present

## 2016-05-10 DIAGNOSIS — B351 Tinea unguium: Secondary | ICD-10-CM | POA: Diagnosis not present

## 2016-05-10 DIAGNOSIS — E1151 Type 2 diabetes mellitus with diabetic peripheral angiopathy without gangrene: Secondary | ICD-10-CM | POA: Diagnosis not present

## 2016-05-10 DIAGNOSIS — M79675 Pain in left toe(s): Secondary | ICD-10-CM | POA: Diagnosis not present

## 2016-05-13 ENCOUNTER — Encounter (HOSPITAL_COMMUNITY): Payer: Medicare Other | Attending: Oncology

## 2016-05-13 DIAGNOSIS — D509 Iron deficiency anemia, unspecified: Secondary | ICD-10-CM | POA: Diagnosis not present

## 2016-05-13 DIAGNOSIS — C8330 Diffuse large B-cell lymphoma, unspecified site: Secondary | ICD-10-CM | POA: Diagnosis not present

## 2016-05-13 DIAGNOSIS — D471 Chronic myeloproliferative disease: Secondary | ICD-10-CM

## 2016-05-13 LAB — COMPREHENSIVE METABOLIC PANEL
ALBUMIN: 3.7 g/dL (ref 3.5–5.0)
ALK PHOS: 62 U/L (ref 38–126)
ALT: 10 U/L — ABNORMAL LOW (ref 14–54)
AST: 16 U/L (ref 15–41)
Anion gap: 8 (ref 5–15)
BILIRUBIN TOTAL: 0.7 mg/dL (ref 0.3–1.2)
BUN: 17 mg/dL (ref 6–20)
CALCIUM: 9.2 mg/dL (ref 8.9–10.3)
CO2: 26 mmol/L (ref 22–32)
Chloride: 105 mmol/L (ref 101–111)
Creatinine, Ser: 1.13 mg/dL — ABNORMAL HIGH (ref 0.44–1.00)
GFR calc Af Amer: 51 mL/min — ABNORMAL LOW (ref >60)
GFR calc non Af Amer: 44 mL/min — ABNORMAL LOW (ref >60)
GLUCOSE: 132 mg/dL — AB (ref 65–99)
Potassium: 3.4 mmol/L — ABNORMAL LOW (ref 3.5–5.1)
Sodium: 139 mmol/L (ref 135–145)
TOTAL PROTEIN: 6.9 g/dL (ref 6.5–8.1)

## 2016-05-13 LAB — CBC WITH DIFFERENTIAL/PLATELET
BASOS PCT: 0 %
Basophils Absolute: 0 10*3/uL (ref 0.0–0.1)
Eosinophils Absolute: 0.1 10*3/uL (ref 0.0–0.7)
Eosinophils Relative: 1 %
HEMATOCRIT: 36.1 % (ref 36.0–46.0)
HEMOGLOBIN: 12.2 g/dL (ref 12.0–15.0)
Lymphocytes Relative: 39 %
Lymphs Abs: 2.8 10*3/uL (ref 0.7–4.0)
MCH: 36.7 pg — ABNORMAL HIGH (ref 26.0–34.0)
MCHC: 33.8 g/dL (ref 30.0–36.0)
MCV: 108.7 fL — ABNORMAL HIGH (ref 78.0–100.0)
MONOS PCT: 8 %
Monocytes Absolute: 0.6 10*3/uL (ref 0.1–1.0)
NEUTROS ABS: 3.6 10*3/uL (ref 1.7–7.7)
NEUTROS PCT: 52 %
Platelets: 520 10*3/uL — ABNORMAL HIGH (ref 150–400)
RBC: 3.32 MIL/uL — ABNORMAL LOW (ref 3.87–5.11)
RDW: 15.5 % (ref 11.5–15.5)
WBC: 7 10*3/uL (ref 4.0–10.5)

## 2016-05-13 LAB — IRON AND TIBC
Iron: 70 ug/dL (ref 28–170)
Saturation Ratios: 20 % (ref 10.4–31.8)
TIBC: 346 ug/dL (ref 250–450)
UIBC: 276 ug/dL

## 2016-05-13 LAB — FOLATE: FOLATE: 15.1 ng/mL (ref >5.9)

## 2016-05-13 LAB — VITAMIN B12: Vitamin B-12: 518 pg/mL (ref 180–914)

## 2016-05-13 LAB — LACTATE DEHYDROGENASE: LDH: 132 U/L (ref 98–192)

## 2016-05-13 LAB — FERRITIN: Ferritin: 15 ng/mL (ref 11–307)

## 2016-05-14 ENCOUNTER — Other Ambulatory Visit (HOSPITAL_COMMUNITY): Payer: Self-pay | Admitting: Adult Health

## 2016-05-14 DIAGNOSIS — D471 Chronic myeloproliferative disease: Secondary | ICD-10-CM

## 2016-06-08 DIAGNOSIS — D471 Chronic myeloproliferative disease: Secondary | ICD-10-CM | POA: Diagnosis not present

## 2016-06-08 DIAGNOSIS — D7589 Other specified diseases of blood and blood-forming organs: Secondary | ICD-10-CM | POA: Diagnosis not present

## 2016-06-08 DIAGNOSIS — I739 Peripheral vascular disease, unspecified: Secondary | ICD-10-CM | POA: Diagnosis not present

## 2016-06-08 DIAGNOSIS — I10 Essential (primary) hypertension: Secondary | ICD-10-CM | POA: Diagnosis not present

## 2016-06-25 ENCOUNTER — Other Ambulatory Visit (HOSPITAL_COMMUNITY): Payer: Self-pay | Admitting: Oncology

## 2016-06-25 DIAGNOSIS — E876 Hypokalemia: Secondary | ICD-10-CM

## 2016-07-12 NOTE — Progress Notes (Signed)
Leslie Williamson, [place] [address]   CLINIC:  Medical Oncology/Hematology  PCP:  Rosita Fire, MD [place] [address] [phone]   REASON FOR VISIT:  Follow-up for myeloproliferative disorder/thrombocytosis, JAK2+ AND Diffuse Large B-Cell Lymphoma AND Iron deficiency anemia   CURRENT THERAPY: Hydrea 500 mg Monday-Sunday AND Surveillance AND IV iron prn    BRIEF ONCOLOGIC HISTORY:    NHL (non-Hodgkin's lymphoma) (HCC)   07/19/1995 Pathology Results    Spleen biopsy with resection- low-grade B-cell lymphoma, splenic marginal zone type      02/20/1998 Imaging    CT CAP-marked and extensive cervical adenopathy      02/25/1998 Pathology Results    Right cervical lymph node demonstrating large B-cell lymphoma      03/05/1998 Bone Marrow Biopsy    No evidence of bone marrow involvement      03/11/1998 - 07/18/1998 Chemotherapy    CHOP x 7 cycles      05/09/1998 Imaging    CT CAP and neck- slight interval decrease in size of para-aortic adenopathy with interval decrease in right inguinal and bi-iliac adenopathy.  Interval decrease in size and number of enlarged cervical nodes      07/10/1998 Imaging    Ct CAP and neck- unremarkable CT of chest. No enlarged retroperitoneal adenopathy withthe previously noted retroperitoneal nodes currently smaller to stable in size.  Cervical adenopathy is stable to slightly smaller in size.      08/18/1998 - 09/08/1998 Chemotherapy    Rituxan Day 1, 8, 15, 22 x 1 cycle      01/08/1999 Remission    CT CAP and neck demonstrates no adenopathy or disease      11 /16/2000 Imaging    CT CAP and neck- Stable CT of chest. Stable CT abd/pelvis without adenopathy.  Negative CT of neck        HISTORY OF PRESENT ILLNESS:  (From Kirby Crigler, PA-C's last note on 12/25/15)       INTERVAL HISTORY:  Leslie Williamson presents to the cancer center for ongoing follow-up for JAK2 positive  myeloproliferative disorder with thrombocytosis, low-grade diffuse large B-cell lymphoma, and iron deficiency anemia.  She tells me she has been overall feeling quite well. She does have decreased energy, "but I feel great. I am so blessed."  Does report that it is a little bit more difficult for her to do her household chores d/t fatigue. This has become a bit worse since increasing Hydrea schedule to 7 days per week at her last visit.  She denies any active bleeding including blood in her stools, melena, nosebleeds, or bleeding from her gums.  Her appetite is good. Denies any chest pain or palpitations.   Reports issues with toenail discoloration. She has been seeing a podiatrist for this issue and was prescribed Terbinaf ointment. She does not feel it is helping with the darkening nails. Also reports intermittent constipation; she has a bowel movement every other day generally. She manages her constipation by eating grapes rather than taking any additional medications.   She has not had labs yet today.    REVIEW OF SYSTEMS:  Review of Systems  Constitutional: Positive for fatigue. Negative for chills and fever.  HENT:  Negative.  Negative for lump/mass and nosebleeds.   Eyes: Negative.   Respiratory: Negative.  Negative for cough and shortness of breath.   Cardiovascular: Negative.  Negative for chest pain and leg swelling.  Gastrointestinal: Positive for constipation. Negative for  abdominal pain, blood in stool, diarrhea, nausea and vomiting.  Endocrine: Negative.   Genitourinary: Negative.  Negative for dysuria and hematuria.   Musculoskeletal: Negative.  Negative for arthralgias.  Skin: Negative.  Negative for rash.       Toenail discoloration  Neurological: Negative.  Negative for dizziness and headaches.  Hematological: Negative.  Negative for adenopathy. Does not bruise/bleed easily.  Psychiatric/Behavioral: Negative.  Negative for depression and sleep disturbance. The patient is not  nervous/anxious.      PAST MEDICAL/SURGICAL HISTORY:  Past Medical History:  Diagnosis Date  . Anxiety   . Complication of anesthesia    difficult to wake up  . Depression   . Hypercholesteremia   . Hypertension   . Hypokalemia   . Iron deficiency anemia   . Left hip pain   . NHL (non-Hodgkin's lymphoma) ([facility]) 12/17/2010  . Obesity   . Primary thrombocytosis ([facility]) 01/11/2006   Secondary to splenectomy    . PVD (peripheral vascular disease) ([facility])   . S/P splenectomy 01/01/2014  . 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 Vertigo, labyrinthine    Past Surgical History:  Procedure Laterality Date  . CATARACT EXTRACTION W/PHACO Left 11/03/2015   Procedure: CATARACT EXTRACTION PHACO AND INTRAOCULAR LENS PLACEMENT (IOC);  Surgeon: Tonny Branch, MD;  Location: [facility];  Service: Ophthalmology;  Laterality: Left;  CDE: 7.74  . CATARACT EXTRACTION W/PHACO Right 11/20/2015   Procedure: CATARACT EXTRACTION PHACO AND INTRAOCULAR LENS PLACEMENT ; CDE:  8.30;  Surgeon: Tonny Branch, MD;  Location: [facility];  Service: Ophthalmology;  Laterality: Right;  . PORT-A-CATH REMOVAL       SOCIAL HISTORY:  Social History   Social History  . Marital status: Widowed    Spouse name: N/A  . Number of children: N/A  . Years of education: N/A   Occupational History  . Not on file.   Social History Main Topics  . Smoking status: Former Research scientist (life sciences)  . Smokeless tobacco: Never Used  . Alcohol use No  . Drug use: No  . Sexual activity: No   Other Topics Concern  . Not on file   Social History Narrative  . No narrative on file    FAMILY HISTORY:  Family History  Problem Relation Age of Onset  . Diabetes Mother     CURRENT MEDICATIONS:  Outpatient Encounter Prescriptions as of 07/13/2016  Medication Sig Note  . amLODipine (NORVASC) 2.5 MG tablet Take 2.5 mg by mouth daily. 01/10/2015: Received from: External Pharmacy  Received Sig:   . aspirin 81 MG tablet Take 81 mg by mouth daily.   Marland Kitchen atorvastatin (LIPITOR) 40 MG tablet Take 40 mg by mouth daily. 01/10/2015: Received from: External Pharmacy Received Sig:   . cilostazol (PLETAL) 100 MG tablet Take 100 mg by mouth 2 (two) times daily.   . diclofenac (CATAFLAM) 50 MG tablet Take 1 tablet (50 mg total) by mouth 2 (two) times daily.   . hydroxyurea (HYDREA) 500 MG capsule Take 500 mg PO daily.  May take with food to minimize GI side effects.   Marland Kitchen ibuprofen (ADVIL,MOTRIN) 800 MG tablet Take 800 mg by mouth every 8 (eight) hours as needed for moderate pain.  03/11/2015: Received from: External Pharmacy  . losartan-hydrochlorothiazide (HYZAAR) 100-12.5 MG per tablet Take 1 tablet by mouth daily. Reported on 06/30/2015   . meclizine (ANTIVERT) 25 MG tablet Take 1 tablet (25 mg total) by mouth  3 (three) times daily as needed for dizziness.   . potassium chloride SA (K-DUR,KLOR-CON) 20 MEQ tablet TAKE ONE TABLET BY MOUTH DAILY.    No facility-administered encounter medications on file as of 07/13/2016.     ALLERGIES:  Allergies  Allergen Reactions  . Penicillins Rash     PHYSICAL EXAM:  ECOG Performance status: 1 - Symptomatic, but independent.   Vitals:   07/13/16 1417  BP: 102/74  Pulse: (!) 113  Resp: 18  Temp: 98.1 F (36.7 C)   Filed Weights   07/13/16 1417  Weight: 165 lb 3.2 oz (74.9 kg)    Physical Exam  Constitutional: She is oriented to person, place, and time and well-developed, well-nourished, and in no distress.  HENT:  Head: Normocephalic.  Mouth/Throat: Oropharynx is clear and moist. No oropharyngeal exudate.  Eyes: Conjunctivae are normal. Pupils are equal, round, and reactive to light. No scleral icterus.  Neck: Normal range of motion. Neck supple.  Cardiovascular: Normal rate, regular rhythm and normal heart sounds.   Pulmonary/Chest: Effort normal and breath sounds normal. No respiratory distress.  Abdominal: Soft. Bowel sounds  are normal. There is no tenderness.  Musculoskeletal: Normal range of motion. She exhibits no edema.  Lymphadenopathy:    She has no cervical adenopathy.       Right: No supraclavicular adenopathy present.       Left: No supraclavicular adenopathy present.  Neurological: She is alert and oriented to person, place, and time. No cranial nerve deficit. Gait normal.  Skin: Skin is warm and dry. No rash noted.  Psychiatric: Mood, memory, affect and judgment normal.  Nursing note and vitals reviewed.    LABORATORY DATA:  I have reviewed the labs as listed.  CBC    Component Value Date/Time   WBC 7.0 05/13/2016 1444   RBC 3.32 (L) 05/13/2016 1444   HGB 12.2 05/13/2016 1444   HCT 36.1 05/13/2016 1444   PLT 520 (H) 05/13/2016 1444   MCV 108.7 (H) 05/13/2016 1444   MCH 36.7 (H) 05/13/2016 1444   MCHC 33.8 05/13/2016 1444   RDW 15.5 05/13/2016 1444   LYMPHSABS 2.8 05/13/2016 1444   MONOABS 0.6 05/13/2016 1444   EOSABS 0.1 05/13/2016 1444   BASOSABS 0.0 05/13/2016 1444   CMP Latest Ref Rng & Units 05/13/2016 04/15/2016 02/26/2016  Glucose 65 - 99 mg/dL 132(H) 124(H) 185(H)  BUN 6 - 20 mg/dL 17 11 17   Creatinine 0.44 - 1.00 mg/dL 1.13(H) 0.92 1.14(H)  Sodium 135 - 145 mmol/L 139 136 137  Potassium 3.5 - 5.1 mmol/L 3.4(L) 3.7 3.8  Chloride 101 - 111 mmol/L 105 104 105  CO2 22 - 32 mmol/L 26 24 25   Calcium 8.9 - 10.3 mg/dL 9.2 9.0 9.3  Total Protein 6.5 - 8.1 g/dL 6.9 6.8 7.0  Total Bilirubin 0.3 - 1.2 mg/dL 0.7 0.6 0.6  Alkaline Phos 38 - 126 U/L 62 60 65  AST 15 - 41 U/L 16 19 17   ALT 14 - 54 U/L 10(L) 11(L) 11(L)    PENDING LABS:    DIAGNOSTIC IMAGING:    PATHOLOGY:  Colonoscopy: 11/01/08          ASSESSMENT & PLAN:   Myeloproliferative disorder/thrombocytosis:  -Thrombocytosis likely secondary to previous splenectomy and JAK2 mutation.  -Platelet count resulted after patient left clinic; Plts 433,000 which is excellent.  Patient has been compliant with prescribed  regimen of Hydrea 500 mg daily.  -Regarding her concerns of nail changes and constipation: We discussed that according  to UpToDate, nail discoloration can occur in bout 2% of patients taking Hydrea. Constipation can also be a side effect.   -Recommended she continue Hydrea 500 mg daily at this time. We will certainly call her to review labs and encourage her to continue Hydrea as previously prescribed.   -Return to cancer center in 3 months for follow-up with labs.   Iron deficiency anemia/Fatigue:  -She has some mildly worsening fatigue, but this is chronic. I suspect her worsening fatigue is secondary to daily dosing of Hydrea.  Denies any blood in her stools or melena. Last colonoscopy was in 10/2008; tubular adenoma was noted, but there was no high grade dysplasia or malignancy.  -I will add on vitamin D level given her age, as vitamin D deficiency can also contribute to fatigue.   -Will obtain iron studies as well when she visits the lab today. We will call her with results when they are available.   Diffuse large B-cell lymphoma:  -Diagnosed in 1999; s/p CHOP chemotherapy x 7 cycles, followed by Rituxan x 1 cycle. Post-treatment CT scans showed complete response.  -No clinical evidence of recurrent disease on physical exam; no lymphadenopathy appreciated. Imaging not necessary given remote history of lymphoma.  We will continue to monitor with physical exam and labs per NCCN Guidelines.         Dispo:  -Labs today. (CBC with diff, CMET, LDH, vitamin D, & iron studies). -Return to cancer center in 3 months with labs. (CBC with diff, CMET, LDH, & iron studies).    All questions were answered to patient's stated satisfaction. Encouraged patient to call with any new concerns or questions before her next visit to the cancer center and we can certain see her sooner, if needed.    Plan of care discussed with Dr. Talbert Cage, who agrees with the above aforementioned.    Orders placed this  encounter:  Orders Placed This Encounter  Procedures  . CBC with Differential/Platelet  . Comprehensive metabolic panel  . Lactate dehydrogenase  . Ferritin  . Iron and TIBC  . VITAMIN D 25 Hydroxy (Vit-D Deficiency, Fractures)  . CBC with Differential/Platelet  . Comprehensive metabolic panel  . Ferritin  . Iron and TIBC  . Lactate dehydrogenase      Mike Craze, NP [place] [phone]

## 2016-07-13 ENCOUNTER — Encounter (HOSPITAL_COMMUNITY): Payer: Self-pay | Admitting: Adult Health

## 2016-07-13 ENCOUNTER — Encounter (HOSPITAL_COMMUNITY): Payer: Medicare Other

## 2016-07-13 ENCOUNTER — Encounter (HOSPITAL_COMMUNITY): Payer: Medicare Other | Attending: Oncology | Admitting: Adult Health

## 2016-07-13 VITALS — BP 102/74 | HR 113 | Temp 98.1°F | Resp 18 | Wt 165.2 lb

## 2016-07-13 DIAGNOSIS — D509 Iron deficiency anemia, unspecified: Secondary | ICD-10-CM | POA: Insufficient documentation

## 2016-07-13 DIAGNOSIS — D47Z9 Other specified neoplasms of uncertain behavior of lymphoid, hematopoietic and related tissue: Secondary | ICD-10-CM

## 2016-07-13 DIAGNOSIS — D471 Chronic myeloproliferative disease: Secondary | ICD-10-CM | POA: Insufficient documentation

## 2016-07-13 DIAGNOSIS — C8330 Diffuse large B-cell lymphoma, unspecified site: Secondary | ICD-10-CM | POA: Insufficient documentation

## 2016-07-13 DIAGNOSIS — Z8572 Personal history of non-Hodgkin lymphomas: Secondary | ICD-10-CM

## 2016-07-13 DIAGNOSIS — D473 Essential (hemorrhagic) thrombocythemia: Secondary | ICD-10-CM

## 2016-07-13 DIAGNOSIS — R5383 Other fatigue: Secondary | ICD-10-CM | POA: Diagnosis not present

## 2016-07-13 LAB — CBC WITH DIFFERENTIAL/PLATELET
BASOS PCT: 0 %
Basophils Absolute: 0 10*3/uL (ref 0.0–0.1)
EOS PCT: 1 %
Eosinophils Absolute: 0 10*3/uL (ref 0.0–0.7)
HCT: 36.8 % (ref 36.0–46.0)
Hemoglobin: 12.3 g/dL (ref 12.0–15.0)
LYMPHS ABS: 2.8 10*3/uL (ref 0.7–4.0)
Lymphocytes Relative: 44 %
MCH: 36.7 pg — AB (ref 26.0–34.0)
MCHC: 33.4 g/dL (ref 30.0–36.0)
MCV: 109.9 fL — ABNORMAL HIGH (ref 78.0–100.0)
MONO ABS: 0.6 10*3/uL (ref 0.1–1.0)
MONOS PCT: 9 %
Neutro Abs: 2.8 10*3/uL (ref 1.7–7.7)
Neutrophils Relative %: 46 %
Platelets: 433 10*3/uL — ABNORMAL HIGH (ref 150–400)
RBC: 3.35 MIL/uL — ABNORMAL LOW (ref 3.87–5.11)
RDW: 17 % — AB (ref 11.5–15.5)
WBC: 6.3 10*3/uL (ref 4.0–10.5)

## 2016-07-13 LAB — COMPREHENSIVE METABOLIC PANEL
ALK PHOS: 65 U/L (ref 38–126)
ALT: 10 U/L — AB (ref 14–54)
AST: 16 U/L (ref 15–41)
Albumin: 3.8 g/dL (ref 3.5–5.0)
Anion gap: 8 (ref 5–15)
BUN: 21 mg/dL — ABNORMAL HIGH (ref 6–20)
CALCIUM: 9.1 mg/dL (ref 8.9–10.3)
CO2: 26 mmol/L (ref 22–32)
CREATININE: 1.08 mg/dL — AB (ref 0.44–1.00)
Chloride: 105 mmol/L (ref 101–111)
GFR calc non Af Amer: 46 mL/min — ABNORMAL LOW (ref >60)
GFR, EST AFRICAN AMERICAN: 53 mL/min — AB (ref >60)
GLUCOSE: 131 mg/dL — AB (ref 65–99)
Potassium: 3.3 mmol/L — ABNORMAL LOW (ref 3.5–5.1)
Sodium: 139 mmol/L (ref 135–145)
Total Bilirubin: 0.5 mg/dL (ref 0.3–1.2)
Total Protein: 7.3 g/dL (ref 6.5–8.1)

## 2016-07-13 LAB — FERRITIN: FERRITIN: 9 ng/mL — AB (ref 11–307)

## 2016-07-13 LAB — IRON AND TIBC
Iron: 52 ug/dL (ref 28–170)
Saturation Ratios: 13 % (ref 10.4–31.8)
TIBC: 400 ug/dL (ref 250–450)
UIBC: 348 ug/dL

## 2016-07-13 LAB — LACTATE DEHYDROGENASE: LDH: 148 U/L (ref 98–192)

## 2016-07-13 NOTE — Patient Instructions (Signed)
[place] at [facility] Discharge Instructions  RECOMMENDATIONS MADE BY THE CONSULTANT AND ANY TEST RESULTS WILL BE SENT TO YOUR REFERRING PHYSICIAN.  You saw Mike Craze, NP, today Lab work today Follow up in 3 months with labs. See Amy at checkout for appointments.  Thank you for choosing [place] at [facility] to provide your oncology and hematology care.  To afford each patient quality time with our provider, please arrive at least 15 minutes before your scheduled appointment time.    If you have a lab appointment with the [facility] please come in thru the  Main Entrance and check in at the main information desk  You need to re-schedule your appointment should you arrive 10 or more minutes late.  We strive to give you quality time with our providers, and arriving late affects you and other patients whose appointments are after yours.  Also, if you no show three or more times for appointments you may be dismissed from the clinic at the providers discretion.     Again, thank you for choosing [facility].  Our hope is that these requests will decrease the amount of time that you wait before being seen by our physicians.       _____________________________________________________________  Should you have questions after your visit to [facility], please contact our office at [phone] between the hours of 8:30 a.m. and 4:30 p.m.  Voicemails left after 4:30 p.m. will not be returned until the following business day.  For prescription refill requests, have your pharmacy contact our office.       Resources For Cancer Patients and their Caregivers ? American Cancer Society: Can assist with transportation, wigs, general needs, runs Look Good Feel Better.        9294736207 ? Cancer Care: Provides financial assistance, online support groups, medication/co-pay assistance.  1-800-813-HOPE  (516) 670-4570) ? [place] Assists [place] cancer patients and their families through emotional , educational and financial support.  9383858593 ? [place] DSS Where to apply for food stamps, Medicaid and utility assistance. 780-214-8765 ? RCATS: Transportation to medical appointments. (706) 218-1379 ? Social Security Administration: May apply for disability if have a Stage IV cancer. 604-493-9294 774-139-0487 ? [employer], Disability and Transit Services: Assists with nutrition, care and transit needs. [place] Support Programs: @10RELATIVEDAYS @ > Cancer Support Group  2nd Tuesday of the month 1pm-2pm, Journey Room  > Creative Journey  3rd Tuesday of the month 1130am-1pm, Journey Room  > Look Good Feel Better  1st Wednesday of the month 10am-12 noon, Journey Room (Call Mina to register [phone])

## 2016-07-13 NOTE — Addendum Note (Signed)
Addended by: Holley Bouche on: 07/13/2016 03:09 PM   Modules accepted: Level of Service

## 2016-07-14 LAB — VITAMIN D 25 HYDROXY (VIT D DEFICIENCY, FRACTURES): VIT D 25 HYDROXY: 17.9 ng/mL — AB (ref 30.0–100.0)

## 2016-07-15 ENCOUNTER — Other Ambulatory Visit (HOSPITAL_COMMUNITY): Payer: Self-pay | Admitting: Adult Health

## 2016-07-15 DIAGNOSIS — D509 Iron deficiency anemia, unspecified: Secondary | ICD-10-CM

## 2016-07-15 DIAGNOSIS — E559 Vitamin D deficiency, unspecified: Secondary | ICD-10-CM

## 2016-07-15 DIAGNOSIS — C8330 Diffuse large B-cell lymphoma, unspecified site: Secondary | ICD-10-CM

## 2016-07-15 DIAGNOSIS — E876 Hypokalemia: Secondary | ICD-10-CM

## 2016-07-15 DIAGNOSIS — D471 Chronic myeloproliferative disease: Secondary | ICD-10-CM

## 2016-07-15 MED ORDER — ERGOCALCIFEROL 1.25 MG (50000 UT) PO CAPS: 50000.0000 [IU] | ORAL_CAPSULE | ORAL | 0 refills | Status: DC

## 2016-07-15 MED ORDER — POTASSIUM CHLORIDE CRYS ER 20 MEQ PO TBCR: 20.0000 meq | EXTENDED_RELEASE_TABLET | Freq: Two times a day (BID) | ORAL | 3 refills | Status: DC

## 2016-07-22 DIAGNOSIS — M79674 Pain in right toe(s): Secondary | ICD-10-CM | POA: Diagnosis not present

## 2016-07-22 DIAGNOSIS — M79675 Pain in left toe(s): Secondary | ICD-10-CM | POA: Diagnosis not present

## 2016-07-22 DIAGNOSIS — B351 Tinea unguium: Secondary | ICD-10-CM | POA: Diagnosis not present

## 2016-07-22 DIAGNOSIS — E1151 Type 2 diabetes mellitus with diabetic peripheral angiopathy without gangrene: Secondary | ICD-10-CM | POA: Diagnosis not present

## 2016-07-23 ENCOUNTER — Encounter (HOSPITAL_COMMUNITY): Payer: Medicare Other | Attending: Oncology

## 2016-07-23 VITALS — BP 116/57 | HR 85 | Temp 98.4°F | Resp 18 | Wt 166.2 lb

## 2016-07-23 DIAGNOSIS — C8330 Diffuse large B-cell lymphoma, unspecified site: Secondary | ICD-10-CM | POA: Insufficient documentation

## 2016-07-23 DIAGNOSIS — D509 Iron deficiency anemia, unspecified: Secondary | ICD-10-CM | POA: Insufficient documentation

## 2016-07-23 DIAGNOSIS — D471 Chronic myeloproliferative disease: Secondary | ICD-10-CM | POA: Insufficient documentation

## 2016-07-23 MED ORDER — SODIUM CHLORIDE 0.9 % IV SOLN
510.0000 mg | Freq: Once | INTRAVENOUS | Status: AC
  Administered 2016-07-23: 510 mg via INTRAVENOUS
  Filled 2016-07-23: qty 17

## 2016-07-23 MED ORDER — SODIUM CHLORIDE 0.9 % IV SOLN
Freq: Once | INTRAVENOUS | Status: AC
  Administered 2016-07-23: 12:00:00 via INTRAVENOUS

## 2016-07-23 NOTE — Progress Notes (Signed)
Tolerated iron infusion well. Stable and ambulatory on discharge home with sister.

## 2016-07-23 NOTE — Patient Instructions (Signed)
[facility] at [facility] Discharge Instructions  RECOMMENDATIONS MADE BY THE CONSULTANT AND ANY TEST RESULTS WILL BE SENT TO YOUR REFERRING PHYSICIAN.  Feraheme 510 mg iron infusion given as ordered.  Thank you for choosing [facility] at [facility] to provide your oncology and hematology care.  To afford each patient quality time with our provider, please arrive at least 15 minutes before your scheduled appointment time.    If you have a lab appointment with the Cancer Center please come in thru the  Main Entrance and check in at the main information desk  You need to re-schedule your appointment should you arrive 10 or more minutes late.  We strive to give you quality time with our providers, and arriving late affects you and other patients whose appointments are after yours.  Also, if you no show three or more times for appointments you may be dismissed from the clinic at the providers discretion.     Again, thank you for choosing [facility].  Our hope is that these requests will decrease the amount of time that you wait before being seen by our physicians.       _____________________________________________________________  Should you have questions after your visit to [facility], please contact our office at [phone] between the hours of 8:30 a.m. and 4:30 p.m.  Voicemails left after 4:30 p.m. will not be returned until the following business day.  For prescription refill requests, have your pharmacy contact our office.       Resources For Cancer Patients and their Caregivers ? American Cancer Society: Can assist with transportation, wigs, general needs, runs Look Good Feel Better.        1-888-227-6333 ? Cancer Care: Provides financial assistance, online support groups, medication/co-pay assistance.  1-800-813-HOPE (4673) ? [facility] Assists [place] cancer patients and  their families through emotional , educational and financial support.  336-427-4357 ? [place] DSS Where to apply for food stamps, Medicaid and utility assistance. 336-342-1394 ? RCATS: Transportation to medical appointments. 336-347-2287 ? Social Security Administration: May apply for disability if have a Stage IV cancer. 336-342-7796 1-800-772-1213 ? [place] Aging, Disability and Transit Services: Assists with nutrition, care and transit needs. 336-349-2343  Cancer Center Support Programs: @10RELATIVEDAYS@ > Cancer Support Group  2nd Tuesday of the month 1pm-2pm, Journey Room  > Creative Journey  3rd Tuesday of the month 1130am-1pm, Journey Room  > Look Good Feel Better  1st Wednesday of the month 10am-12 noon, Journey Room (Call American Cancer Society to register 1-800-395-5775)   

## 2016-08-06 ENCOUNTER — Other Ambulatory Visit (HOSPITAL_COMMUNITY): Payer: Self-pay | Admitting: Adult Health

## 2016-08-06 DIAGNOSIS — D471 Chronic myeloproliferative disease: Secondary | ICD-10-CM

## 2016-09-09 ENCOUNTER — Encounter (HOSPITAL_COMMUNITY): Payer: Medicare Other | Attending: Oncology

## 2016-09-09 DIAGNOSIS — E876 Hypokalemia: Secondary | ICD-10-CM | POA: Diagnosis not present

## 2016-09-09 DIAGNOSIS — E559 Vitamin D deficiency, unspecified: Secondary | ICD-10-CM | POA: Insufficient documentation

## 2016-09-09 LAB — COMPREHENSIVE METABOLIC PANEL
ALBUMIN: 3.8 g/dL (ref 3.5–5.0)
ALT: 10 U/L — AB (ref 14–54)
AST: 18 U/L (ref 15–41)
Alkaline Phosphatase: 63 U/L (ref 38–126)
Anion gap: 11 (ref 5–15)
BUN: 15 mg/dL (ref 6–20)
CHLORIDE: 104 mmol/L (ref 101–111)
CO2: 23 mmol/L (ref 22–32)
Calcium: 9.2 mg/dL (ref 8.9–10.3)
Creatinine, Ser: 1.01 mg/dL — ABNORMAL HIGH (ref 0.44–1.00)
GFR calc non Af Amer: 50 mL/min — ABNORMAL LOW (ref >60)
GFR, EST AFRICAN AMERICAN: 58 mL/min — AB (ref >60)
GLUCOSE: 127 mg/dL — AB (ref 65–99)
Potassium: 3.4 mmol/L — ABNORMAL LOW (ref 3.5–5.1)
SODIUM: 138 mmol/L (ref 135–145)
Total Bilirubin: 0.9 mg/dL (ref 0.3–1.2)
Total Protein: 7 g/dL (ref 6.5–8.1)

## 2016-09-10 LAB — VITAMIN D 25 HYDROXY (VIT D DEFICIENCY, FRACTURES): VIT D 25 HYDROXY: 57 ng/mL (ref 30.0–100.0)

## 2016-09-14 DIAGNOSIS — C8330 Diffuse large B-cell lymphoma, unspecified site: Secondary | ICD-10-CM | POA: Diagnosis not present

## 2016-09-14 DIAGNOSIS — I10 Essential (primary) hypertension: Secondary | ICD-10-CM | POA: Diagnosis not present

## 2016-09-14 DIAGNOSIS — D471 Chronic myeloproliferative disease: Secondary | ICD-10-CM | POA: Diagnosis not present

## 2016-09-14 DIAGNOSIS — Z0000 Encounter for general adult medical examination without abnormal findings: Secondary | ICD-10-CM | POA: Diagnosis not present

## 2016-10-13 ENCOUNTER — Encounter (HOSPITAL_BASED_OUTPATIENT_CLINIC_OR_DEPARTMENT_OTHER): Payer: Medicare Other | Admitting: Adult Health

## 2016-10-13 ENCOUNTER — Encounter (HOSPITAL_COMMUNITY): Payer: Medicare Other | Attending: Oncology

## 2016-10-13 ENCOUNTER — Encounter (HOSPITAL_COMMUNITY): Payer: Self-pay | Admitting: Adult Health

## 2016-10-13 VITALS — BP 126/84 | HR 75 | Temp 98.2°F | Resp 20 | Wt 169.2 lb

## 2016-10-13 DIAGNOSIS — D471 Chronic myeloproliferative disease: Secondary | ICD-10-CM

## 2016-10-13 DIAGNOSIS — Z880 Allergy status to penicillin: Secondary | ICD-10-CM | POA: Insufficient documentation

## 2016-10-13 DIAGNOSIS — C8330 Diffuse large B-cell lymphoma, unspecified site: Secondary | ICD-10-CM

## 2016-10-13 DIAGNOSIS — D47Z9 Other specified neoplasms of uncertain behavior of lymphoid, hematopoietic and related tissue: Secondary | ICD-10-CM | POA: Diagnosis not present

## 2016-10-13 DIAGNOSIS — D509 Iron deficiency anemia, unspecified: Secondary | ICD-10-CM | POA: Insufficient documentation

## 2016-10-13 DIAGNOSIS — Z7982 Long term (current) use of aspirin: Secondary | ICD-10-CM | POA: Insufficient documentation

## 2016-10-13 DIAGNOSIS — D473 Essential (hemorrhagic) thrombocythemia: Secondary | ICD-10-CM | POA: Diagnosis not present

## 2016-10-13 DIAGNOSIS — Z79899 Other long term (current) drug therapy: Secondary | ICD-10-CM | POA: Diagnosis not present

## 2016-10-13 DIAGNOSIS — Z8572 Personal history of non-Hodgkin lymphomas: Secondary | ICD-10-CM

## 2016-10-13 DIAGNOSIS — Z9081 Acquired absence of spleen: Secondary | ICD-10-CM | POA: Diagnosis not present

## 2016-10-13 DIAGNOSIS — E559 Vitamin D deficiency, unspecified: Secondary | ICD-10-CM | POA: Insufficient documentation

## 2016-10-13 DIAGNOSIS — Z9889 Other specified postprocedural states: Secondary | ICD-10-CM | POA: Diagnosis not present

## 2016-10-13 DIAGNOSIS — Z87891 Personal history of nicotine dependence: Secondary | ICD-10-CM | POA: Diagnosis not present

## 2016-10-13 DIAGNOSIS — C946 Myelodysplastic disease, not classified: Secondary | ICD-10-CM | POA: Diagnosis present

## 2016-10-13 DIAGNOSIS — R5383 Other fatigue: Secondary | ICD-10-CM

## 2016-10-13 DIAGNOSIS — Z9221 Personal history of antineoplastic chemotherapy: Secondary | ICD-10-CM | POA: Insufficient documentation

## 2016-10-13 DIAGNOSIS — Z833 Family history of diabetes mellitus: Secondary | ICD-10-CM | POA: Insufficient documentation

## 2016-10-13 LAB — CBC WITH DIFFERENTIAL/PLATELET
BASOS ABS: 0 10*3/uL (ref 0.0–0.1)
Basophils Relative: 0 %
EOS ABS: 0.1 10*3/uL (ref 0.0–0.7)
Eosinophils Relative: 1 %
HEMATOCRIT: 36.1 % (ref 36.0–46.0)
HEMOGLOBIN: 12.5 g/dL (ref 12.0–15.0)
LYMPHS PCT: 45 %
Lymphs Abs: 2.9 10*3/uL (ref 0.7–4.0)
MCH: 39.7 pg — AB (ref 26.0–34.0)
MCHC: 34.6 g/dL (ref 30.0–36.0)
MCV: 114.6 fL — ABNORMAL HIGH (ref 78.0–100.0)
MONOS PCT: 11 %
Monocytes Absolute: 0.7 10*3/uL (ref 0.1–1.0)
NEUTROS PCT: 43 %
Neutro Abs: 2.8 10*3/uL (ref 1.7–7.7)
Platelets: 346 10*3/uL (ref 150–400)
RBC: 3.15 MIL/uL — AB (ref 3.87–5.11)
RDW: 15.3 % (ref 11.5–15.5)
WBC: 6.5 10*3/uL (ref 4.0–10.5)

## 2016-10-13 LAB — COMPREHENSIVE METABOLIC PANEL
ALBUMIN: 3.7 g/dL (ref 3.5–5.0)
ALK PHOS: 60 U/L (ref 38–126)
ALT: 14 U/L (ref 14–54)
ANION GAP: 7 (ref 5–15)
AST: 18 U/L (ref 15–41)
BUN: 14 mg/dL (ref 6–20)
CALCIUM: 9.1 mg/dL (ref 8.9–10.3)
CO2: 26 mmol/L (ref 22–32)
CREATININE: 0.95 mg/dL (ref 0.44–1.00)
Chloride: 105 mmol/L (ref 101–111)
GFR calc Af Amer: >60 mL/min (ref >60)
GFR calc non Af Amer: 54 mL/min — ABNORMAL LOW (ref >60)
GLUCOSE: 116 mg/dL — AB (ref 65–99)
Potassium: 3.6 mmol/L (ref 3.5–5.1)
Sodium: 138 mmol/L (ref 135–145)
Total Bilirubin: 0.6 mg/dL (ref 0.3–1.2)
Total Protein: 7.1 g/dL (ref 6.5–8.1)

## 2016-10-13 LAB — LACTATE DEHYDROGENASE: LDH: 177 U/L (ref 98–192)

## 2016-10-13 LAB — FERRITIN: FERRITIN: 45 ng/mL (ref 11–307)

## 2016-10-13 LAB — IRON AND TIBC
IRON: 113 ug/dL (ref 28–170)
Saturation Ratios: 37 % — ABNORMAL HIGH (ref 10.4–31.8)
TIBC: 305 ug/dL (ref 250–450)
UIBC: 192 ug/dL

## 2016-10-13 MED ORDER — HYDROXYUREA 500 MG PO CAPS: ORAL_CAPSULE | ORAL | 1 refills | Status: DC

## 2016-10-13 NOTE — Progress Notes (Signed)
[place], [place] [address]   CLINIC:  Medical Oncology/Hematology  PCP:  Rosita Fire, MD [place] [address] [phone]   REASON FOR VISIT:  Follow-up for myeloproliferative disorder/thrombocytosis, JAK2+ AND Diffuse Large B-Cell Lymphoma AND Iron deficiency anemia AND vitamin D deficiency   CURRENT THERAPY: Hydrea 500 mg daily AND Surveillance AND IV iron prn AND vitamin D OTC daily    BRIEF ONCOLOGIC HISTORY:    NHL (non-Hodgkin's lymphoma) ([place])   07/19/1995 Pathology Results    Spleen biopsy with resection- low-grade B-cell lymphoma, splenic marginal zone type      02/20/1998 Imaging    CT CAP-marked and extensive cervical adenopathy      02/25/1998 Pathology Results    Right cervical lymph node demonstrating large B-cell lymphoma      03/05/1998 Bone Marrow Biopsy    No evidence of bone marrow involvement      03/11/1998 - 07/18/1998 Chemotherapy    CHOP x 7 cycles      05/09/1998 Imaging    CT CAP and neck- slight interval decrease in size of para-aortic adenopathy with interval decrease in right inguinal and bi-iliac adenopathy.  Interval decrease in size and number of enlarged cervical nodes      07/10/1998 Imaging    Ct CAP and neck- unremarkable CT of chest. No enlarged retroperitoneal adenopathy withthe previously noted retroperitoneal nodes currently smaller to stable in size.  Cervical adenopathy is stable to slightly smaller in size.      08/18/1998 - 09/08/1998 Chemotherapy    Rituxan Day 1, 8, 15, 22 x 1 cycle      01/08/1999 Remission    CT CAP and neck demonstrates no adenopathy or disease      01/08/1999 Imaging    CT CAP and neck- Stable CT of chest. Stable CT abd/pelvis without adenopathy.  Negative CT of neck        HISTORY OF PRESENT ILLNESS:  (From Kirby Crigler, PA-C's last note on 12/25/15)       INTERVAL HISTORY:  Leslie Williamson presents to the cancer center for  ongoing follow-up for JAK2 positive myeloproliferative disorder with thrombocytosis, low-grade diffuse large B-cell lymphoma, and iron deficiency anemia.  Continues on Hydrea daily; she tolerating medication well. She requests a refill of Hydrea today. She feels tired, "but I keep going. I'm doing good!"  Energy levels about 50%. Appetite 75%; weight stable.  States that her energy levels did improve a bit after she received IV iron infusion in 07/2016.  She has also recently started taking OTC vitamin B12 daily as well, which she thinks may be helping with her energy levels a bit as well.   Denies any chest pain, shortness of breath, headaches, dizziness, or falls. Bowels are moving well. Denies any bleeding episodes including blood in her stools, dark/time stools, hematuria, vaginal bleeding, nosebleeds, or gingival bleeding.  Denies any adenopathy.   She is excited to share with me that she went to the 14th annual revival for her son's friends and church in [place], [place]. They took a bus for this trip and she reports having a great time.      IV iron admin record for 2018: Oncology Flowsheet 07/23/2016  ferumoxytol [facility]) IV 510 mg       REVIEW OF SYSTEMS:  Review of Systems  Constitutional: Positive for fatigue. Negative for chills and fever.  HENT:   Negative for lump/mass and nosebleeds.   Eyes: Negative.  Respiratory: Negative.  Negative for cough and shortness of breath.   Cardiovascular: Negative.  Negative for chest pain and leg swelling.  Gastrointestinal: Negative.  Negative for abdominal pain, blood in stool, constipation, diarrhea, nausea and vomiting.  Endocrine: Negative.   Genitourinary: Negative.  Negative for dysuria, hematuria and vaginal bleeding.   Musculoskeletal: Negative.   Skin: Negative.  Negative for rash.  Neurological: Negative.   Hematological: Negative.  Negative for adenopathy.  Psychiatric/Behavioral: Negative.      PAST MEDICAL/SURGICAL  HISTORY:  Past Medical History:  Diagnosis Date  . Anxiety   . Complication of anesthesia    difficult to wake up  . Depression   . Hypercholesteremia   . Hypertension   . Hypokalemia   . Iron deficiency anemia   . Left hip pain   . NHL (non-Hodgkin's lymphoma) ([place]) 12/17/2010  . Obesity   . Primary thrombocytosis ([place]) 01/11/2006   Secondary to splenectomy    . PVD (peripheral vascular disease) ([place])   . S/P splenectomy 01/01/2014  . 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 Vertigo, labyrinthine    Past Surgical History:  Procedure Laterality Date  . CATARACT EXTRACTION W/PHACO Left 11/03/2015   Procedure: CATARACT EXTRACTION PHACO AND INTRAOCULAR LENS PLACEMENT (IOC);  Surgeon: Tonny Branch, MD;  Location: [facility];  Service: Ophthalmology;  Laterality: Left;  CDE: 7.74  . CATARACT EXTRACTION W/PHACO Right 11/20/2015   Procedure: CATARACT EXTRACTION PHACO AND INTRAOCULAR LENS PLACEMENT ; CDE:  8.30;  Surgeon: Tonny Branch, MD;  Location: [facility];  Service: Ophthalmology;  Laterality: Right;  . PORT-A-CATH REMOVAL       SOCIAL HISTORY:  Social History   Social History  . Marital status: Widowed    Spouse name: N/A  . Number of children: N/A  . Years of education: N/A   Occupational History  . Not on file.   Social History Main Topics  . Smoking status: Former Research scientist (life sciences)  . Smokeless tobacco: Never Used  . Alcohol use No  . Drug use: No  . Sexual activity: No   Other Topics Concern  . Not on file   Social History Narrative  . No narrative on file    FAMILY HISTORY:  Family History  Problem Relation Age of Onset  . Diabetes Mother     CURRENT MEDICATIONS:  Outpatient Encounter Prescriptions as of 10/13/2016  Medication Sig Note  . amLODipine (NORVASC) 2.5 MG tablet Take 2.5 mg by mouth daily. 01/10/2015: Received from: External Pharmacy Received Sig:   . aspirin 81 MG tablet Take 81 mg by  mouth daily.   Marland Kitchen atorvastatin (LIPITOR) 40 MG tablet Take 40 mg by mouth daily. 01/10/2015: Received from: External Pharmacy Received Sig:   . cilostazol (PLETAL) 100 MG tablet Take 100 mg by mouth 2 (two) times daily.   . diclofenac (CATAFLAM) 50 MG tablet Take 1 tablet (50 mg total) by mouth 2 (two) times daily.   . ergocalciferol (VITAMIN D2) 50000 units capsule Take 1 capsule (50,000 Units total) by mouth once a week.   . hydroxyurea (HYDREA) 500 MG capsule TAKE 1 CAPSULE BY MOUTH DAILY. MAY TAKE WITH FOOD TO MINIMIZE GI SIDEEFFECTS.   Marland Kitchen ibuprofen (ADVIL,MOTRIN) 800 MG tablet Take 800 mg by mouth every 8 (eight) hours as needed for moderate pain.  03/11/2015: Received from: External Pharmacy  . losartan-hydrochlorothiazide (HYZAAR) 100-12.5 MG per tablet Take 1 tablet by mouth daily. Reported  on 06/30/2015   . meclizine (ANTIVERT) 25 MG tablet Take 1 tablet (25 mg total) by mouth 3 (three) times daily as needed for dizziness.   . potassium chloride SA (K-DUR,KLOR-CON) 20 MEQ tablet Take 1 tablet (20 mEq total) by mouth 2 (two) times daily.   . [DISCONTINUED] hydroxyurea (HYDREA) 500 MG capsule TAKE 1 CAPSULE BY MOUTH DAILY. MAY TAKE WITH FOOD TO MINIMIZE GI SIDEEFFECTS.    No facility-administered encounter medications on file as of 10/13/2016.     ALLERGIES:  Allergies  Allergen Reactions  . Penicillins Rash     PHYSICAL EXAM:  ECOG Performance status: 1 - Symptomatic, but independent.   Vitals:   10/13/16 1405  BP: 126/84  Pulse: 75  Resp: 20  Temp: 98.2 F (36.8 C)  SpO2: 100%   Filed Weights   10/13/16 1405  Weight: 169 lb 3.2 oz (76.7 kg)    Physical Exam  Constitutional: She is oriented to person, place, and time and well-developed, well-nourished, and in no distress.  HENT:  Head: Normocephalic.  Mouth/Throat: Oropharynx is clear and moist. No oropharyngeal exudate.  Eyes: Pupils are equal, round, and reactive to light. Conjunctivae are normal. No scleral icterus.    Neck: Normal range of motion. Neck supple.  Cardiovascular: Normal rate, regular rhythm and normal heart sounds.   Pulmonary/Chest: Effort normal and breath sounds normal. No respiratory distress. She has no wheezes. She has no rales.  Abdominal: Soft. Bowel sounds are normal. There is no tenderness.  Musculoskeletal: Normal range of motion. She exhibits no edema.  Lymphadenopathy:    She has no cervical adenopathy.       Right: No supraclavicular adenopathy present.       Left: No supraclavicular adenopathy present.  Neurological: She is alert and oriented to person, place, and time. No cranial nerve deficit. Gait normal.  Skin: Skin is warm and dry. No rash noted.  Psychiatric: Mood, memory, affect and judgment normal.  Nursing note and vitals reviewed.    LABORATORY DATA:  I have reviewed the labs as listed.  CBC    Component Value Date/Time   WBC 6.5 10/13/2016 1318   RBC 3.15 (L) 10/13/2016 1318   HGB 12.5 10/13/2016 1318   HCT 36.1 10/13/2016 1318   PLT 346 10/13/2016 1318   MCV 114.6 (H) 10/13/2016 1318   MCH 39.7 (H) 10/13/2016 1318   MCHC 34.6 10/13/2016 1318   RDW 15.3 10/13/2016 1318   LYMPHSABS 2.9 10/13/2016 1318   MONOABS 0.7 10/13/2016 1318   EOSABS 0.1 10/13/2016 1318   BASOSABS 0.0 10/13/2016 1318   CMP Latest Ref Rng & Units 10/13/2016 09/09/2016 07/13/2016  Glucose 65 - 99 mg/dL 116(H) 127(H) 131(H)  BUN 6 - 20 mg/dL 14 15 21(H)  Creatinine 0.44 - 1.00 mg/dL 0.95 1.01(H) 1.08(H)  Sodium 135 - 145 mmol/L 138 138 139  Potassium 3.5 - 5.1 mmol/L 3.6 3.4(L) 3.3(L)  Chloride 101 - 111 mmol/L 105 104 105  CO2 22 - 32 mmol/L _0 Calcium 8.9 - 10.3 mg/dL 9.1 9.2 9.1  Total Protein 6.5 - 8.1 g/dL 7.1 7.0 7.3  Total Bilirubin 0.3 - 1.2 mg/dL 0.6 0.9 0.5  Alkaline Phos 38 - 126 U/L 60 63 65  AST 15 - 41 U/L _1 ALT 14 - 54 U/L 14 10(L) 10(L)    PENDING LABS:    DIAGNOSTIC IMAGING:    PATHOLOGY:  Colonoscopy:  11/01/08  ASSESSMENT & PLAN:   Myeloproliferative disorder/thrombocytosis:  -Thrombocytosis likely secondary to previous splenectomy and JAK2 mutation.  -Platelet count today 346,000 today. Her goal platelet count is <450,000.  She has been compliant with taking Hydrea 500 mg daily and tolerating well, with fatigue as her biggest complaint.  Fatigue is currently manageable for her.  -Since platelet count is at goal, will maintain current Hydrea dosing/schedule.  E-scribed Hydrea refill to her pharmacy today. Will bring her back in for labs only in 2 months to ensure stability of platelet count.   -Return to cancer center in 4 months for follow-up with labs.   Iron deficiency anemia:  -Ferritin low in 06/2016 at 8; iron sats on low end of normal at 13% at that time as well.  She received 1 dose of IV Feraheme on 07/23/16 and tolerated without complaints. She did have some clinical benefit in improvement in her fatigue.   -Iron studies pending for today. Hemoglobin remains normal, which is encouraging.  Will certainly contact her with the results when they are available and make arrangements for additional IV iron, if needed.  Could consider starting po iron, but will wait for now.  -Will have her come back in 2 months for labs only to recheck iron studies with CBC.   If persistent iron deficiency, then may need to consider starting oral iron and/or referral to GI for further evaluation, if patient desires.   Vitamin D deficiency:  -Diagnosed at last visit with serum vitamin D level noted to 17.9 in 06/2016. She was started on vitamin D 50,000 IU once per week x 8 weeks.  Repeat vitamin D in 08/2016 was normal at 57. She has not been taking any additional vitamin D since that time.  -Discussed with her that vitamin D deficiency can contribute to fatigue. Encouraged her to start OTC vitamin D 2000 IU daily to help maintain normal vitamin D levels. She agreed with this plan.    Diffuse  large B-cell lymphoma:  -Diagnosed in 1999; s/p CHOP chemotherapy x 7 cycles, followed by Rituxan x 1 cycle. Post-treatment CT scans showed complete response.  -No lymphadenopathy appreciated on exam today. No surveillance imaging recommended. LDH has remained normal as well.   -Will continue to monitor with physical exam and lab studies per NCCN Guidelines.          Dispo:  -Labs only in 2 months. (CBC with diff, ferritin, iron/TIBC) -Return to cancer center in 4 months with labs.  (CBC with diff, CMET, vitamin D, LDH, ferritin, iron/TIBC).    All questions were answered to patient's stated satisfaction. Encouraged patient to call with any new concerns or questions before her next visit to the cancer center and we can certain see her sooner, if needed.    Plan of care discussed with Dr. Talbert Cage, who agrees with the above aforementioned.    Orders placed this encounter:  Orders Placed This Encounter  Procedures  . Ferritin  . Iron and TIBC  . CBC with Differential/Platelet  . Comprehensive metabolic panel  . Ferritin  . Iron and TIBC  . Lactate dehydrogenase  . VITAMIN D 25 Hydroxy (Vit-D Deficiency, Fractures)  . CBC with Differential/Platelet  . Ferritin  . Iron and TIBC      Mike Craze, NP [place] [phone]

## 2016-10-13 NOTE — Patient Instructions (Addendum)
[place] at [facility] Discharge Instructions  RECOMMENDATIONS MADE BY THE CONSULTANT AND ANY TEST RESULTS WILL BE SENT TO YOUR REFERRING PHYSICIAN.  You were seen today by Mike Craze, NP You can pick up Vitamin D3 over the counter. Be sure you are taking 2000 IU once daily. Return in 2 months for lab work Follow up in 4 months with lab work and a visit with provider See schedulers up front for appointments    Thank you for choosing [place] at [facility] to provide your oncology and hematology care.  To afford each patient quality time with our provider, please arrive at least 15 minutes before your scheduled appointment time.    If you have a lab appointment with the [facility] please come in thru the  Main Entrance and check in at the main information desk  You need to re-schedule your appointment should you arrive 10 or more minutes late.  We strive to give you quality time with our providers, and arriving late affects you and other patients whose appointments are after yours.  Also, if you no show three or more times for appointments you may be dismissed from the clinic at the providers discretion.     Again, thank you for choosing [facility].  Our hope is that these requests will decrease the amount of time that you wait before being seen by our physicians.       _____________________________________________________________  Should you have questions after your visit to [facility], please contact our office at [phone] between the hours of 8:30 a.m. and 4:30 p.m.  Voicemails left after 4:30 p.m. will not be returned until the following business day.  For prescription refill requests, have your pharmacy contact our office.       Resources For Cancer Patients and their Caregivers ? American Cancer Society: Can assist with transportation, wigs, general needs, runs Look Good Feel Better.         2397399013 ? Cancer Care: Provides financial assistance, online support groups, medication/co-pay assistance.  1-800-813-HOPE (559)887-5694) ? [facility] Assists [place] cancer patients and their families through emotional , educational and financial support.  570-516-8948 ? [place] DSS Where to apply for food stamps, Medicaid and utility assistance. 972-308-7329 ? [employer]: Transportation to medical appointments. 6784665266 ? Social Security Administration: May apply for disability if have a Stage IV cancer. 779-111-8594 (864)322-0710 ? [employer], Disability and Transit Services: Assists with nutrition, care and transit needs. [facility] Support Programs: @10RELATIVEDAYS @ > Cancer Support Group  2nd Tuesday of the month 1pm-2pm, Journey Room  > Creative Journey  3rd Tuesday of the month 1130am-1pm, Journey Room  > Look Good Feel Better  1st Wednesday of the month 10am-12 noon, Journey Room (Call [place] to register [phone])

## 2016-11-02 IMAGING — DX DG CHEST 2V
2 series · 2 of 2 positions shown · non-contrast
Comparison: 04/10/2015

CLINICAL DATA: Bad cold x 2 weeks ago/htn/ex smoker x 40 years

EXAM:
CHEST  2 VIEW

[chest pa]
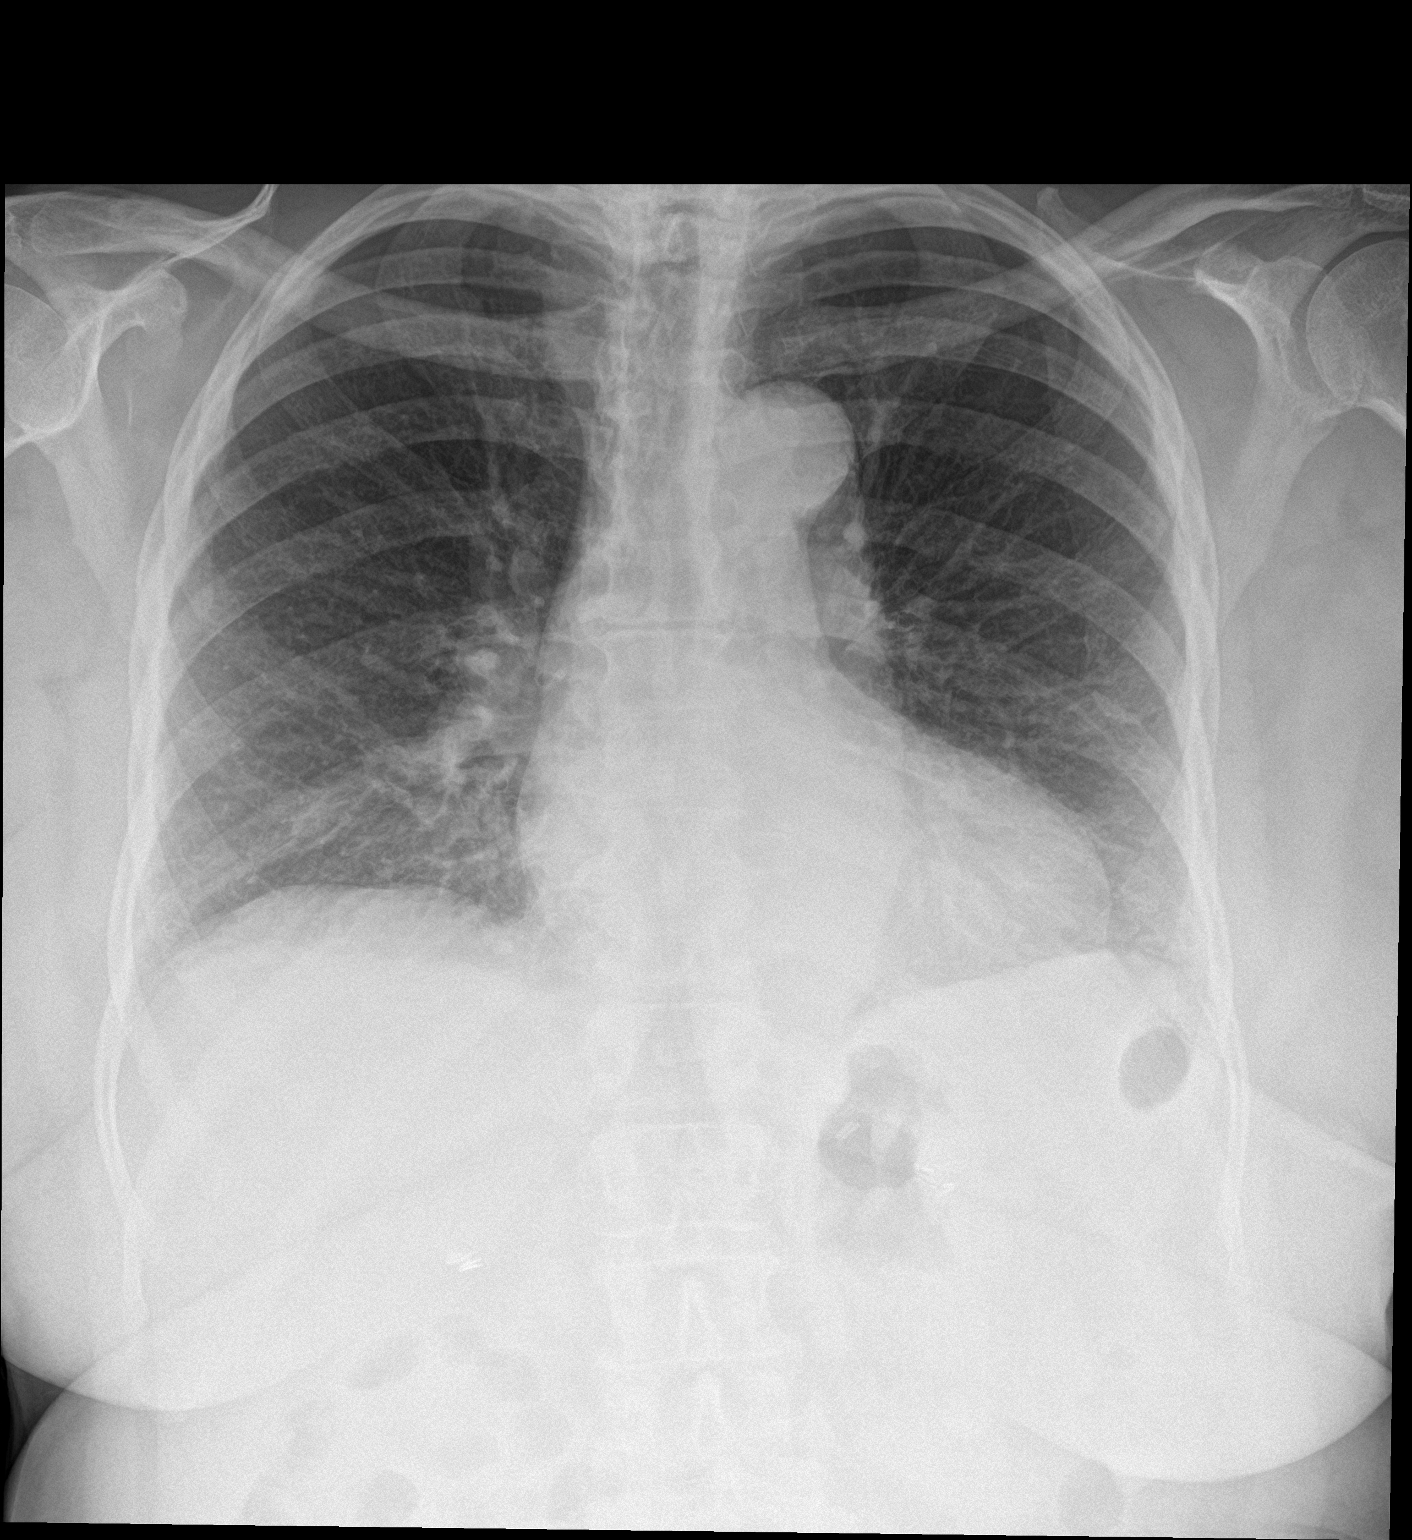

[chest lat]
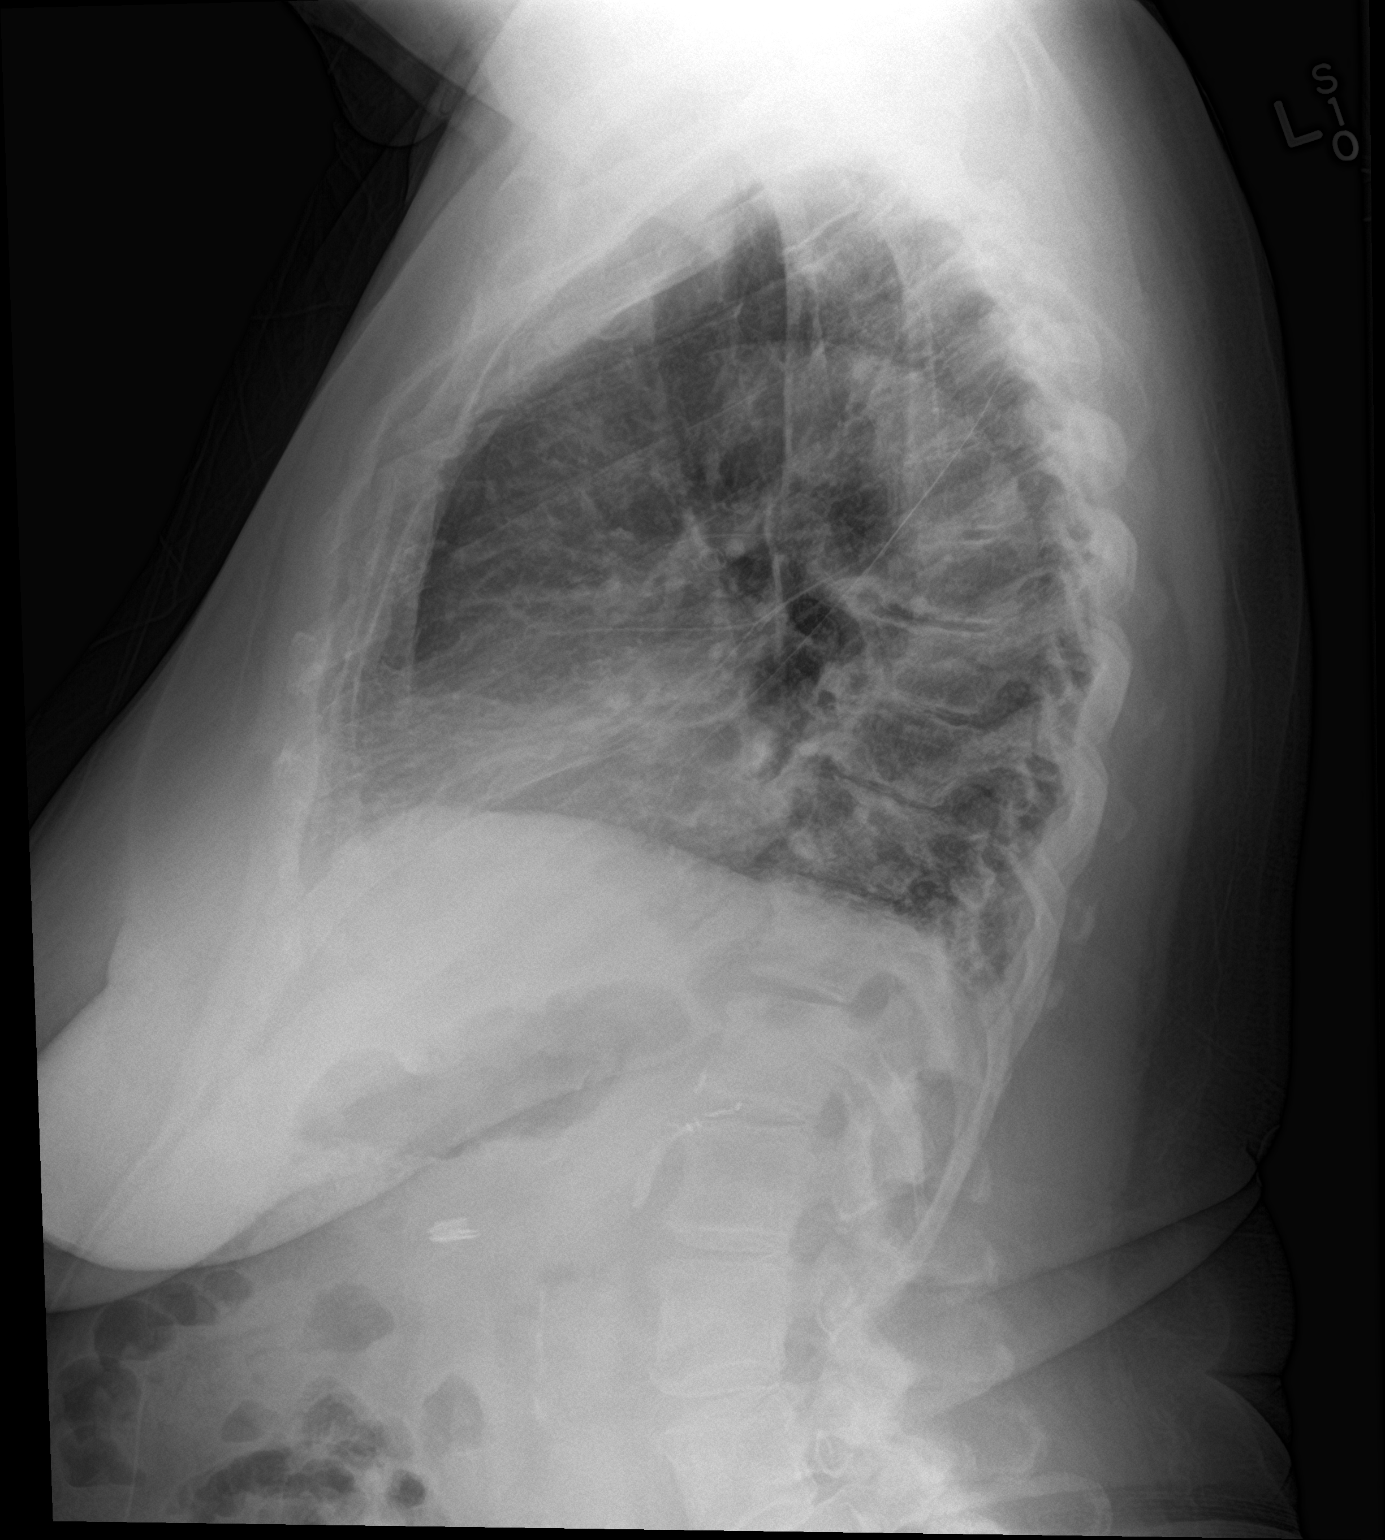

[2 of 2 positions shown; findings below may reference images not displayed]

FINDINGS: The heart is mildly enlarged. There is prominence of interstitial
markings at the bases, increased in the right middle lobe and
lingula.
IMPRESSION: 1. Cardiomegaly without pulmonary edema.
2. Increasing interstitial changes particularly in the right middle
lobe and lingula. Consider typical infections and atypical infection
such as [place].

## 2016-11-29 ENCOUNTER — Other Ambulatory Visit (HOSPITAL_COMMUNITY): Payer: Self-pay | Admitting: Adult Health

## 2016-11-29 DIAGNOSIS — E876 Hypokalemia: Secondary | ICD-10-CM

## 2016-12-10 ENCOUNTER — Encounter (HOSPITAL_COMMUNITY): Payer: Medicare Other | Attending: Oncology

## 2016-12-10 DIAGNOSIS — D471 Chronic myeloproliferative disease: Secondary | ICD-10-CM | POA: Diagnosis present

## 2016-12-10 DIAGNOSIS — D509 Iron deficiency anemia, unspecified: Secondary | ICD-10-CM | POA: Insufficient documentation

## 2016-12-10 LAB — IRON AND TIBC
IRON: 76 ug/dL (ref 28–170)
Saturation Ratios: 24 % (ref 10.4–31.8)
TIBC: 318 ug/dL (ref 250–450)
UIBC: 242 ug/dL

## 2016-12-10 LAB — CBC WITH DIFFERENTIAL/PLATELET
BASOS ABS: 0 10*3/uL (ref 0.0–0.1)
Basophils Relative: 0 %
Eosinophils Absolute: 0.1 10*3/uL (ref 0.0–0.7)
Eosinophils Relative: 1 %
HCT: 39.6 % (ref 36.0–46.0)
Hemoglobin: 13.2 g/dL (ref 12.0–15.0)
LYMPHS ABS: 2.6 10*3/uL (ref 0.7–4.0)
Lymphocytes Relative: 44 %
MCH: 38.9 pg — ABNORMAL HIGH (ref 26.0–34.0)
MCHC: 33.3 g/dL (ref 30.0–36.0)
MCV: 116.8 fL — ABNORMAL HIGH (ref 78.0–100.0)
MONO ABS: 0.4 10*3/uL (ref 0.1–1.0)
Monocytes Relative: 7 %
NEUTROS ABS: 2.9 10*3/uL (ref 1.7–7.7)
Neutrophils Relative %: 48 %
PLATELETS: 409 10*3/uL — AB (ref 150–400)
RBC: 3.39 MIL/uL — AB (ref 3.87–5.11)
RDW: 14 % (ref 11.5–15.5)
WBC: 6 10*3/uL (ref 4.0–10.5)

## 2016-12-10 LAB — FERRITIN: Ferritin: 35 ng/mL (ref 11–307)

## 2016-12-14 DIAGNOSIS — I10 Essential (primary) hypertension: Secondary | ICD-10-CM | POA: Diagnosis not present

## 2016-12-14 DIAGNOSIS — I739 Peripheral vascular disease, unspecified: Secondary | ICD-10-CM | POA: Diagnosis not present

## 2016-12-14 DIAGNOSIS — D509 Iron deficiency anemia, unspecified: Secondary | ICD-10-CM | POA: Diagnosis not present

## 2016-12-14 DIAGNOSIS — D471 Chronic myeloproliferative disease: Secondary | ICD-10-CM | POA: Diagnosis not present

## 2017-01-12 DIAGNOSIS — Z961 Presence of intraocular lens: Secondary | ICD-10-CM | POA: Diagnosis not present

## 2017-01-12 DIAGNOSIS — H5203 Hypermetropia, bilateral: Secondary | ICD-10-CM | POA: Diagnosis not present

## 2017-01-12 DIAGNOSIS — H04123 Dry eye syndrome of bilateral lacrimal glands: Secondary | ICD-10-CM | POA: Diagnosis not present

## 2017-01-12 DIAGNOSIS — H52223 Regular astigmatism, bilateral: Secondary | ICD-10-CM | POA: Diagnosis not present

## 2017-01-12 DIAGNOSIS — H524 Presbyopia: Secondary | ICD-10-CM | POA: Diagnosis not present

## 2017-02-07 ENCOUNTER — Other Ambulatory Visit (HOSPITAL_COMMUNITY): Payer: Self-pay | Admitting: Adult Health

## 2017-02-07 DIAGNOSIS — E876 Hypokalemia: Secondary | ICD-10-CM

## 2017-02-09 NOTE — Progress Notes (Signed)
Leslie Williamson, [place] [address]   CLINIC:  Medical Oncology/Hematology  PCP:  Leslie Fire, MD [place] [address] [phone]   REASON FOR VISIT:  Follow-up for myeloproliferative disorder/thrombocytosis, JAK2+ AND Diffuse Large B-Cell Lymphoma AND Iron deficiency anemia AND vitamin D deficiency   CURRENT THERAPY: Hydrea 500 mg daily AND Surveillance AND IV iron prn AND vitamin D OTC daily    BRIEF ONCOLOGIC HISTORY:    NHL (non-Hodgkin's lymphoma) ([facility])   07/19/1995 Pathology Results    Spleen biopsy with resection- low-grade B-cell lymphoma, splenic marginal zone type      02/20/1998 Imaging    CT CAP-marked and extensive cervical adenopathy      02/25/1998 Pathology Results    Right cervical lymph node demonstrating large B-cell lymphoma      03/05/1998 Bone Marrow Biopsy    No evidence of bone marrow involvement      03/11/1998 - 07/18/1998 Chemotherapy    CHOP x 7 cycles      05/09/1998 Imaging    CT CAP and neck- slight interval decrease in size of para-aortic adenopathy with interval decrease in right inguinal and bi-iliac adenopathy.  Interval decrease in size and number of enlarged cervical nodes      07/10/1998 Imaging    Ct CAP and neck- unremarkable CT of chest. No enlarged retroperitoneal adenopathy withthe previously noted retroperitoneal nodes currently smaller to stable in size.  Cervical adenopathy is stable to slightly smaller in size.      08/18/1998 - 09/08/1998 Chemotherapy    Rituxan Day 1, 8, 15, 22 x 1 cycle      01/08/1999 Remission    CT CAP and neck demonstrates no adenopathy or disease      01/08/1999 Imaging    CT CAP and neck- Stable CT of chest. Stable CT abd/pelvis without adenopathy.  Negative CT of neck        HISTORY OF PRESENT ILLNESS:  (From Leslie Crigler, PA-C's last note on 12/25/15)       INTERVAL HISTORY:  Leslie Williamson presents to the cancer center for  ongoing follow-up for JAK2 positive myeloproliferative disorder with thrombocytosis, low-grade diffuse large B-cell lymphoma, and iron deficiency anemia.  Here today unaccompanied.    Overall, she tells me she has been feeling okay. Appetite 75%; energy levels 50%. She attributes most of her fatigue to the Hydrea. She is taking Hydrea 500 mg daily as prescribed; denies any missed doses.  Her weight is stable.  Denies any bleeding episodes. Denies any chest pain, shortness, of breath, headaches, dizziness, or falls.  Denies any fever/chills. Denies adenopathy.   She is tearful today. She shares with me that her 64 year old daughter unexpectedly died last week of unknown cause. States that she had been sick for ~10 years, but had been doing okay for awhile.  Her family, including her son, has been supportive.  She is planning to go see her sister this afternoon after her visit here at the cancer center to spend some time with her.  I offered my sincerest condolences to Leslie Williamson and her family during this difficult time.   Otherwise, she is without other complaints or concerns today.     IV iron admin record for 2018: Oncology Flowsheet 07/23/2016  ferumoxytol [facility]) IV 510 mg       REVIEW OF SYSTEMS:  Review of Systems  Constitutional: Positive for fatigue.  HENT:  Negative.   Eyes: Negative.  Respiratory: Negative.   Cardiovascular: Negative.   Gastrointestinal: Negative.   Endocrine: Negative.   Genitourinary: Negative.    Musculoskeletal: Negative.   Skin: Negative.   Neurological: Negative.   Hematological: Negative.   Psychiatric/Behavioral:       Grief/sadness d/t very recent death of her daughter     PAST MEDICAL/SURGICAL HISTORY:  Past Medical History:  Diagnosis Date  . Anxiety   . Complication of anesthesia    difficult to wake up  . Depression   . Hypercholesteremia   . Hypertension   . Hypokalemia   . Iron deficiency anemia   . Left hip pain   . NHL  (non-Hodgkin's lymphoma) ([facility]) 12/17/2010  . Obesity   . Primary thrombocytosis ([facility]) 01/11/2006   Secondary to splenectomy    . PVD (peripheral vascular disease) ([facility])   . S/P splenectomy 01/01/2014  . 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 Vertigo, labyrinthine    Past Surgical History:  Procedure Laterality Date  . CATARACT EXTRACTION W/PHACO Left 11/03/2015   Procedure: CATARACT EXTRACTION PHACO AND INTRAOCULAR LENS PLACEMENT (IOC);  Surgeon: Leslie Branch, MD;  Location: [facility];  Service: Ophthalmology;  Laterality: Left;  CDE: 7.74  . CATARACT EXTRACTION W/PHACO Right 11/20/2015   Procedure: CATARACT EXTRACTION PHACO AND INTRAOCULAR LENS PLACEMENT ; CDE:  8.30;  Surgeon: Leslie Branch, MD;  Location: [facility];  Service: Ophthalmology;  Laterality: Right;  . PORT-A-CATH REMOVAL       SOCIAL HISTORY:  Social History   Socioeconomic History  . Marital status: Widowed    Spouse name: Not on file  . Number of children: Not on file  . Years of education: Not on file  . Highest education level: Not on file  Social Needs  . Financial resource strain: Not on file  . Food insecurity - worry: Not on file  . Food insecurity - inability: Not on file  . Transportation needs - medical: Not on file  . Transportation needs - non-medical: Not on file  Occupational History  . Not on file  Tobacco Use  . Smoking status: Former Research scientist (life sciences)  . Smokeless tobacco: Never Used  Substance and Sexual Activity  . Alcohol use: No  . Drug use: No  . Sexual activity: No  Other Topics Concern  . Not on file  Social History Narrative  . Not on file    FAMILY HISTORY:  Family History  Problem Relation Age of Onset  . Diabetes Mother     CURRENT MEDICATIONS:  Outpatient Encounter Medications as of 02/10/2017  Medication Sig Note  . ALPRAZolam (XANAX) 0.5 MG tablet    . amLODipine (NORVASC) 2.5 MG tablet Take 2.5 mg by mouth  daily. 01/10/2015: Received from: External Pharmacy Received Sig:   . aspirin 81 MG tablet Take 81 mg by mouth daily.   Marland Kitchen atorvastatin (LIPITOR) 40 MG tablet Take 40 mg by mouth daily. 01/10/2015: Received from: External Pharmacy Received Sig:   . cilostazol (PLETAL) 100 MG tablet Take 100 mg by mouth 2 (two) times daily.   . diclofenac (CATAFLAM) 50 MG tablet Take 1 tablet (50 mg total) by mouth 2 (two) times daily.   . hydroxyurea (HYDREA) 500 MG capsule TAKE 1 CAPSULE BY MOUTH DAILY. MAY TAKE WITH FOOD TO MINIMIZE GI SIDEEFFECTS.   Marland Kitchen ibuprofen (ADVIL,MOTRIN) 800 MG tablet Take 800 mg by mouth every 8 (eight) hours as needed for moderate pain.  03/11/2015: Received from: External Pharmacy  . losartan-hydrochlorothiazide (HYZAAR) 100-12.5 MG per tablet Take 1 tablet by mouth daily. Reported on 06/30/2015   . meclizine (ANTIVERT) 25 MG tablet Take 1 tablet (25 mg total) by mouth 3 (three) times daily as needed for dizziness.   . potassium chloride SA (K-DUR,KLOR-CON) 20 MEQ tablet TAKE ONE TABLET BY MOUTH TWICE DAILY.   . [DISCONTINUED] ergocalciferol (VITAMIN D2) 50000 units capsule Take 1 capsule (50,000 Units total) by mouth once a week.    No facility-administered encounter medications on file as of 02/10/2017.     ALLERGIES:  Allergies  Allergen Reactions  . Penicillins Rash     PHYSICAL EXAM:  ECOG Performance status: 1 - Symptomatic, but independent.   Vitals:   02/10/17 1406  BP: 119/77  Pulse: (!) 103  Resp: 16  Temp: 98.5 F (36.9 C)  SpO2: 95%   Filed Weights   02/10/17 1406  Weight: 168 lb (76.2 kg)    Physical Exam  Constitutional: She is oriented to person, place, and time and well-developed, well-nourished, and in no distress.  HENT:  Head: Normocephalic.  Mouth/Throat: Oropharynx is clear and moist. No oropharyngeal exudate.  Eyes: Conjunctivae are normal. Pupils are equal, round, and reactive to light. No scleral icterus.  Neck: Normal range of motion. Neck  supple.  Cardiovascular: Normal rate and regular rhythm.  Pulmonary/Chest: Effort normal and breath sounds normal. No respiratory distress. She has no wheezes.  Abdominal: Soft. Bowel sounds are normal. There is no tenderness.  Musculoskeletal: Normal range of motion. She exhibits no edema.  Lymphadenopathy:    She has no cervical adenopathy.       Right: No supraclavicular adenopathy present.       Left: No supraclavicular adenopathy present.  Neurological: She is alert and oriented to person, place, and time. No cranial nerve deficit. Gait normal.  Skin: Skin is warm and dry. No rash noted.  Psychiatric: Memory and judgment normal.  Appropriately tearful and sad at times during visit, particularly when speaking about her daughter   Nursing note and vitals reviewed.    LABORATORY DATA:  I have reviewed the labs as listed.  CBC    Component Value Date/Time   WBC 7.2 02/10/2017 1301   RBC 3.40 (L) 02/10/2017 1301   HGB 12.7 02/10/2017 1301   HCT 38.9 02/10/2017 1301   PLT 414 (H) 02/10/2017 1301   MCV 114.4 (H) 02/10/2017 1301   MCH 37.4 (H) 02/10/2017 1301   MCHC 32.6 02/10/2017 1301   RDW 14.2 02/10/2017 1301   LYMPHSABS 3.5 02/10/2017 1301   MONOABS 0.7 02/10/2017 1301   EOSABS 0.0 02/10/2017 1301   BASOSABS 0.0 02/10/2017 1301   CMP Latest Ref Rng & Units 02/10/2017 10/13/2016 09/09/2016  Glucose 65 - 99 mg/dL 143(H) 116(H) 127(H)  BUN 6 - 20 mg/dL _0 Creatinine 0.44 - 1.00 mg/dL 1.03(H) 0.95 1.01(H)  Sodium 135 - 145 mmol/L 140 138 138  Potassium 3.5 - 5.1 mmol/L 3.7 3.6 3.4(L)  Chloride 101 - 111 mmol/L 105 105 104  CO2 22 - 32 mmol/L _1 Calcium 8.9 - 10.3 mg/dL 9.4 9.1 9.2  Total Protein 6.5 - 8.1 g/dL 7.2 7.1 7.0  Total Bilirubin 0.3 - 1.2 mg/dL 0.7 0.6 0.9  Alkaline Phos 38 - 126 U/L 65 60 63  AST 15 - 41 U/L _2 ALT 14 - 54 U/L 17 14 10(L)    PENDING LABS:  DIAGNOSTIC IMAGING:    PATHOLOGY:  Colonoscopy:  11/01/08           ASSESSMENT & PLAN:   Myeloproliferative disorder/thrombocytosis:  -Thrombocytosis likely secondary to previous splenectomy and JAK2 mutation.  -Labs reviewed with patient in detail today.  Plts 414,000; plt goal <450,000.  She remains compliant with daily Hydrea 500 mg daily dosing; she tolerates relatively well, with fatigue being her biggest complaint. "But I just take my medicine because I know I have to if I want to live."  Continue current dose of Hydrea.  -Labs only in 2 months.  -Return to cancer center in 4 months for follow-up with labs.   Iron deficiency anemia:  -Ferritin low in 06/2016 at 8; iron sats on low end of normal at 13% at that time as well.  She received 1 dose of IV Feraheme on 07/23/16 and tolerated without complaints. She did have some clinical benefit in improvement in her fatigue at that time.  -Iron studies are pending; Hgb is normal at 12.7 g/dL, which has been her general baseline hemoglobin. Aside from fatigue, she is asymptomatic.  No reported frank bleeding episodes. Will contact her with lab results when they are available and make arrangements for IV iron if needed.    Vitamin D deficiency:  -Initially noted to be vitamin D deficient in 06/2016; vitamin D was supplemented with prescription 50,000 IU for ~8 weeks. Repeat vitamin D level was normal.  She was encouraged to take OTC vitamin D supplement since that time -Vitamin D level pending at this time. If she needs any additional vitamin D supplementation, we will contact her and let her know.   Diffuse large B-cell lymphoma:  -Diagnosed in 1999; s/p CHOP chemotherapy x 7 cycles, followed by Rituxan x 1 cycle. Post-treatment CT scans showed complete response.  -No lymphadenopathy appreciated on exam today. No surveillance imaging recommended. LDH is normal.  -Will continue to monitor with physical exam and lab studies per NCCN Guidelines.           Dispo:  -Labs only in 2  months. (CBC with diff, ferritin, iron/TIBC) -Return to cancer center in 4 months with labs.  (CBC with diff, CMET, vitamin D, LDH, ferritin, iron/TIBC).    All questions were answered to patient's stated satisfaction. Encouraged patient to call with any new concerns or questions before her next visit to the cancer center and we can certain see her sooner, if needed.    Plan of care discussed with Dr. Talbert Cage, who agrees with the above aforementioned.    Orders placed this encounter:  Orders Placed This Encounter  Procedures  . CBC with Differential/Platelet  . Ferritin  . Iron and TIBC  . VITAMIN D 25 Hydroxy (Vit-D Deficiency, Fractures)  . Lactate dehydrogenase  . Comprehensive metabolic panel      Mike Craze, NP [place] [phone]

## 2017-02-10 ENCOUNTER — Encounter (HOSPITAL_BASED_OUTPATIENT_CLINIC_OR_DEPARTMENT_OTHER): Payer: Medicare Other | Admitting: Adult Health

## 2017-02-10 ENCOUNTER — Encounter (HOSPITAL_COMMUNITY): Payer: Medicare Other | Attending: Oncology

## 2017-02-10 ENCOUNTER — Encounter (HOSPITAL_COMMUNITY): Payer: Self-pay | Admitting: Adult Health

## 2017-02-10 ENCOUNTER — Other Ambulatory Visit: Payer: Self-pay

## 2017-02-10 VITALS — BP 119/77 | HR 103 | Temp 98.5°F | Resp 16 | Ht 60.0 in | Wt 168.0 lb

## 2017-02-10 DIAGNOSIS — R5383 Other fatigue: Secondary | ICD-10-CM | POA: Diagnosis not present

## 2017-02-10 DIAGNOSIS — Z9221 Personal history of antineoplastic chemotherapy: Secondary | ICD-10-CM | POA: Diagnosis not present

## 2017-02-10 DIAGNOSIS — E559 Vitamin D deficiency, unspecified: Secondary | ICD-10-CM | POA: Diagnosis not present

## 2017-02-10 DIAGNOSIS — D473 Essential (hemorrhagic) thrombocythemia: Secondary | ICD-10-CM | POA: Diagnosis not present

## 2017-02-10 DIAGNOSIS — C8330 Diffuse large B-cell lymphoma, unspecified site: Secondary | ICD-10-CM | POA: Diagnosis present

## 2017-02-10 DIAGNOSIS — D471 Chronic myeloproliferative disease: Secondary | ICD-10-CM

## 2017-02-10 DIAGNOSIS — Z9889 Other specified postprocedural states: Secondary | ICD-10-CM | POA: Insufficient documentation

## 2017-02-10 DIAGNOSIS — Z87891 Personal history of nicotine dependence: Secondary | ICD-10-CM | POA: Insufficient documentation

## 2017-02-10 DIAGNOSIS — D509 Iron deficiency anemia, unspecified: Secondary | ICD-10-CM

## 2017-02-10 DIAGNOSIS — Z7982 Long term (current) use of aspirin: Secondary | ICD-10-CM | POA: Diagnosis not present

## 2017-02-10 DIAGNOSIS — C946 Myelodysplastic disease, not classified: Secondary | ICD-10-CM | POA: Diagnosis not present

## 2017-02-10 DIAGNOSIS — I10 Essential (primary) hypertension: Secondary | ICD-10-CM | POA: Insufficient documentation

## 2017-02-10 DIAGNOSIS — Z79899 Other long term (current) drug therapy: Secondary | ICD-10-CM | POA: Diagnosis not present

## 2017-02-10 DIAGNOSIS — D47Z9 Other specified neoplasms of uncertain behavior of lymphoid, hematopoietic and related tissue: Secondary | ICD-10-CM | POA: Diagnosis not present

## 2017-02-10 LAB — CBC WITH DIFFERENTIAL/PLATELET
BASOS ABS: 0 10*3/uL (ref 0.0–0.1)
BASOS PCT: 0 %
EOS ABS: 0 10*3/uL (ref 0.0–0.7)
Eosinophils Relative: 0 %
HEMATOCRIT: 38.9 % (ref 36.0–46.0)
HEMOGLOBIN: 12.7 g/dL (ref 12.0–15.0)
LYMPHS PCT: 49 %
Lymphs Abs: 3.5 10*3/uL (ref 0.7–4.0)
MCH: 37.4 pg — AB (ref 26.0–34.0)
MCHC: 32.6 g/dL (ref 30.0–36.0)
MCV: 114.4 fL — ABNORMAL HIGH (ref 78.0–100.0)
MONOS PCT: 10 %
Monocytes Absolute: 0.7 10*3/uL (ref 0.1–1.0)
NEUTROS PCT: 41 %
Neutro Abs: 3 10*3/uL (ref 1.7–7.7)
Platelets: 414 10*3/uL — ABNORMAL HIGH (ref 150–400)
RBC: 3.4 MIL/uL — ABNORMAL LOW (ref 3.87–5.11)
RDW: 14.2 % (ref 11.5–15.5)
WBC: 7.2 10*3/uL (ref 4.0–10.5)

## 2017-02-10 LAB — COMPREHENSIVE METABOLIC PANEL
ALBUMIN: 3.8 g/dL (ref 3.5–5.0)
ALK PHOS: 65 U/L (ref 38–126)
ALT: 17 U/L (ref 14–54)
AST: 22 U/L (ref 15–41)
Anion gap: 12 (ref 5–15)
BUN: 16 mg/dL (ref 6–20)
CALCIUM: 9.4 mg/dL (ref 8.9–10.3)
CO2: 23 mmol/L (ref 22–32)
CREATININE: 1.03 mg/dL — AB (ref 0.44–1.00)
Chloride: 105 mmol/L (ref 101–111)
GFR calc Af Amer: 57 mL/min — ABNORMAL LOW (ref >60)
GFR calc non Af Amer: 49 mL/min — ABNORMAL LOW (ref >60)
GLUCOSE: 143 mg/dL — AB (ref 65–99)
Potassium: 3.7 mmol/L (ref 3.5–5.1)
SODIUM: 140 mmol/L (ref 135–145)
Total Bilirubin: 0.7 mg/dL (ref 0.3–1.2)
Total Protein: 7.2 g/dL (ref 6.5–8.1)

## 2017-02-10 LAB — IRON AND TIBC
Iron: 109 ug/dL (ref 28–170)
Saturation Ratios: 35 % — ABNORMAL HIGH (ref 10.4–31.8)
TIBC: 315 ug/dL (ref 250–450)
UIBC: 206 ug/dL

## 2017-02-10 LAB — FERRITIN: Ferritin: 39 ng/mL (ref 11–307)

## 2017-02-10 LAB — LACTATE DEHYDROGENASE: LDH: 154 U/L (ref 98–192)

## 2017-02-10 NOTE — Patient Instructions (Signed)
[place] at [facility] Discharge Instructions  RECOMMENDATIONS MADE BY THE CONSULTANT AND ANY TEST RESULTS WILL BE SENT TO YOUR REFERRING PHYSICIAN.  You were seen today by Mike Craze NP. Return in 2 months for labs.  Return in 4 months for labs and follow up.   Thank you for choosing [place] at [facility] to provide your oncology and hematology care.  To afford each patient quality time with our provider, please arrive at least 15 minutes before your scheduled appointment time.    If you have a lab appointment with the [facility] [place] [facility] please come in thru the  Main Entrance and check in at the main information desk  You need to re-schedule your appointment should you arrive 10 or more minutes late.  We strive to give you quality time with our providers, and arriving late affects you and other patients whose appointments are after yours.  Also, if you no show three or more times for appointments you may be dismissed from the clinic at the providers discretion.     Again, thank you for choosing [facility].  Our hope is that these requests will decrease the amount of time that you wait before being seen by our physicians.       _____________________________________________________________  Should you have questions after your visit to [facility], please contact our office at [phone] between the hours of 8:30 a.m. and 4:30 p.m.  Voicemails left after 4:30 p.m. will not be returned until the following business day.  For prescription refill requests, have your pharmacy contact our office.       Resources For Cancer Patients and their Caregivers ? American Cancer Society: Can assist with transportation, wigs, general needs, runs Look Good Feel Better.        949 811 8300 ? Cancer Care: Provides financial assistance, online support groups, medication/co-pay assistance.  1-800-813-HOPE (725)425-2119) ? [place] Assists [place] cancer patients and their families through emotional , educational and financial support.  678-380-2359 ? [place] DSS Where to apply for food stamps, Medicaid and utility assistance. 867-642-9521 ? [facility]: Transportation to medical appointments. 515-552-0599 ? Social Security Administration: May apply for disability if have a Stage IV cancer. 779-812-4631 (480)438-2187 ? [employer], Disability and Transit Services: Assists with nutrition, care and transit needs. [place] Support Programs: @10RELATIVEDAYS @ > Cancer Support Group  2nd Tuesday of the month 1pm-2pm, Journey Room  > Creative Journey  3rd Tuesday of the month 1130am-1pm, Journey Room  > Look Good Feel Better  1st Wednesday of the month 10am-12 noon, Journey Room (Call [place] to register [phone])

## 2017-02-11 LAB — VITAMIN D 25 HYDROXY (VIT D DEFICIENCY, FRACTURES): Vit D, 25-Hydroxy: 52 ng/mL (ref 30.0–100.0)

## 2017-03-07 ENCOUNTER — Other Ambulatory Visit (HOSPITAL_COMMUNITY): Payer: Self-pay | Admitting: Adult Health

## 2017-03-07 DIAGNOSIS — E876 Hypokalemia: Secondary | ICD-10-CM

## 2017-03-20 IMAGING — DX DG SHOULDER 2+V*R*
3 series · 3 of 3 positions shown · non-contrast
Comparison: None.

CLINICAL DATA: Right shoulder pain radiating down lateral arm for 1
month. No known injury.

EXAM:
RIGHT SHOULDER - 2+ VIEW

[shoulder ap]
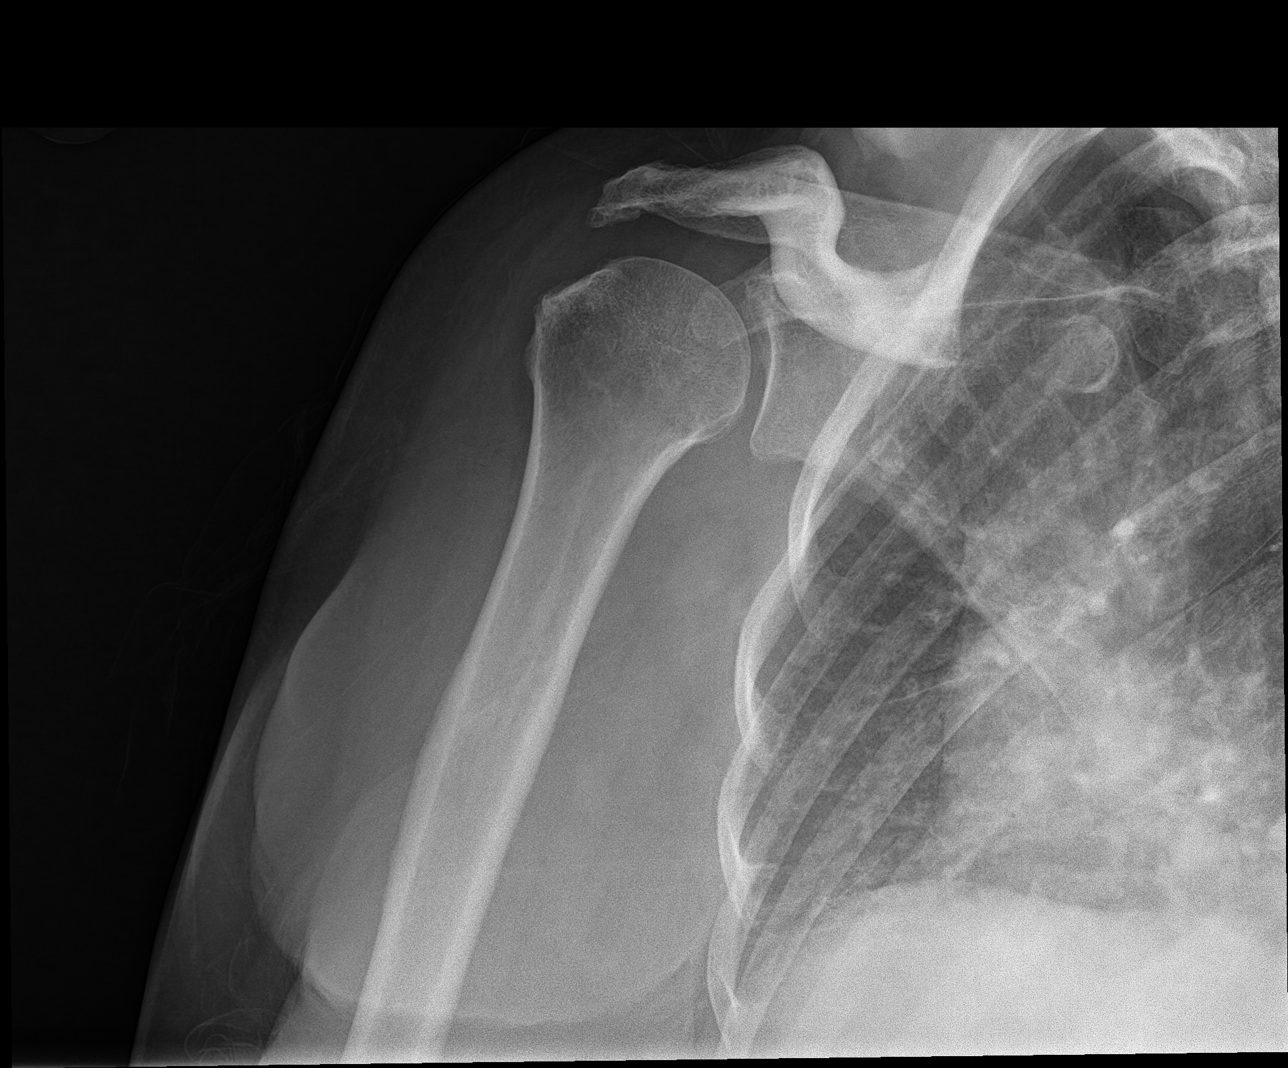

[shoulder y view]
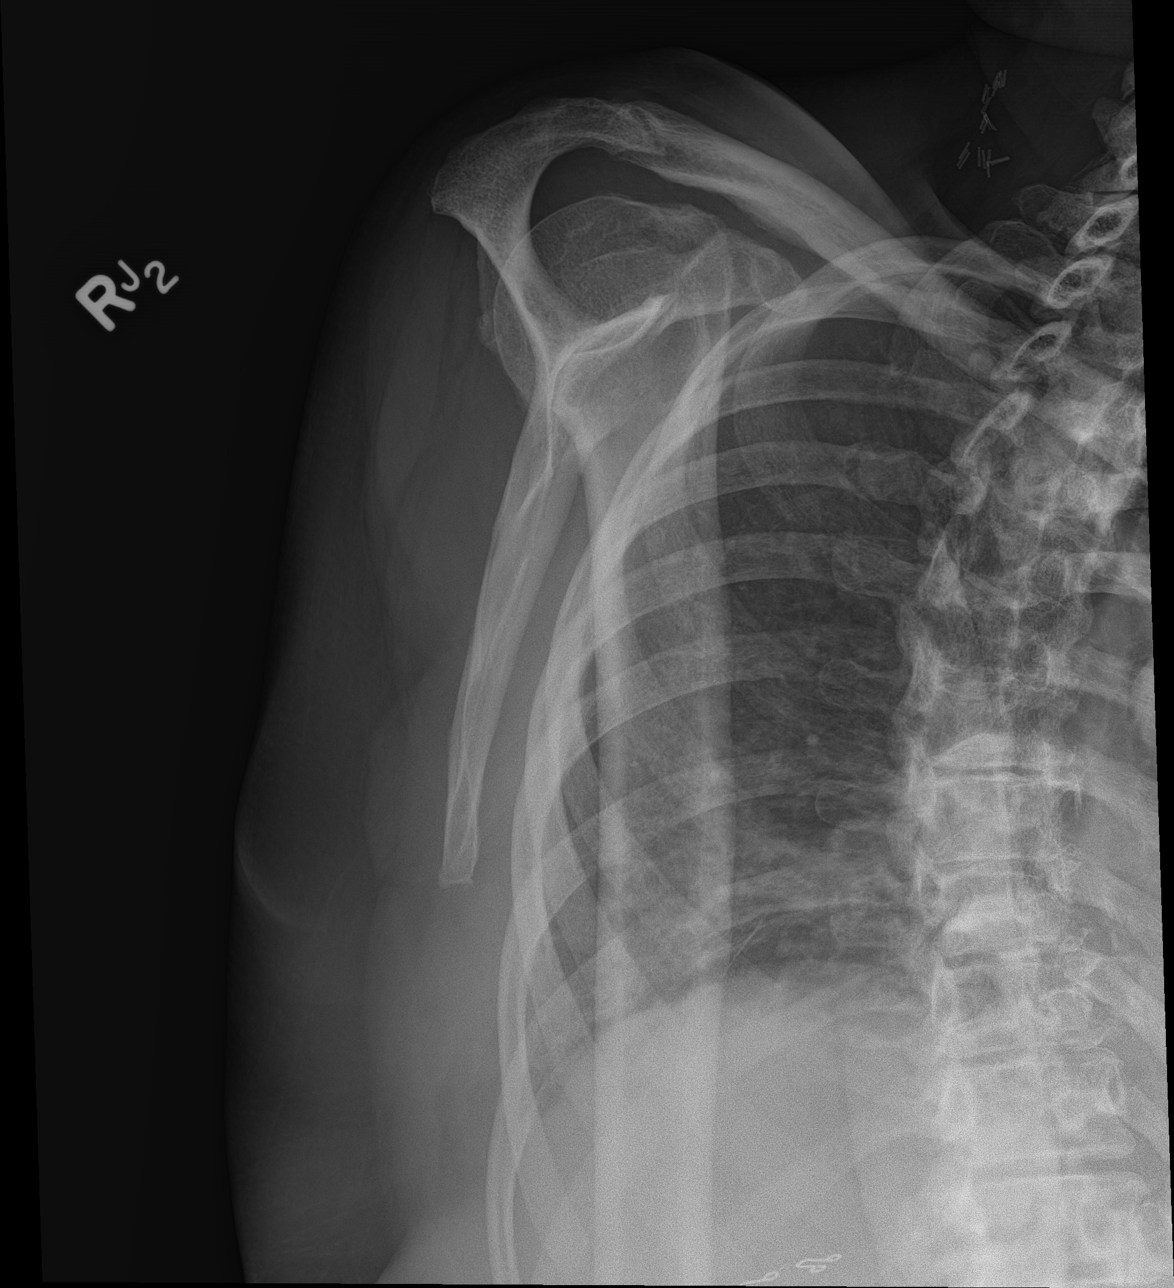

[shoulder axillary]
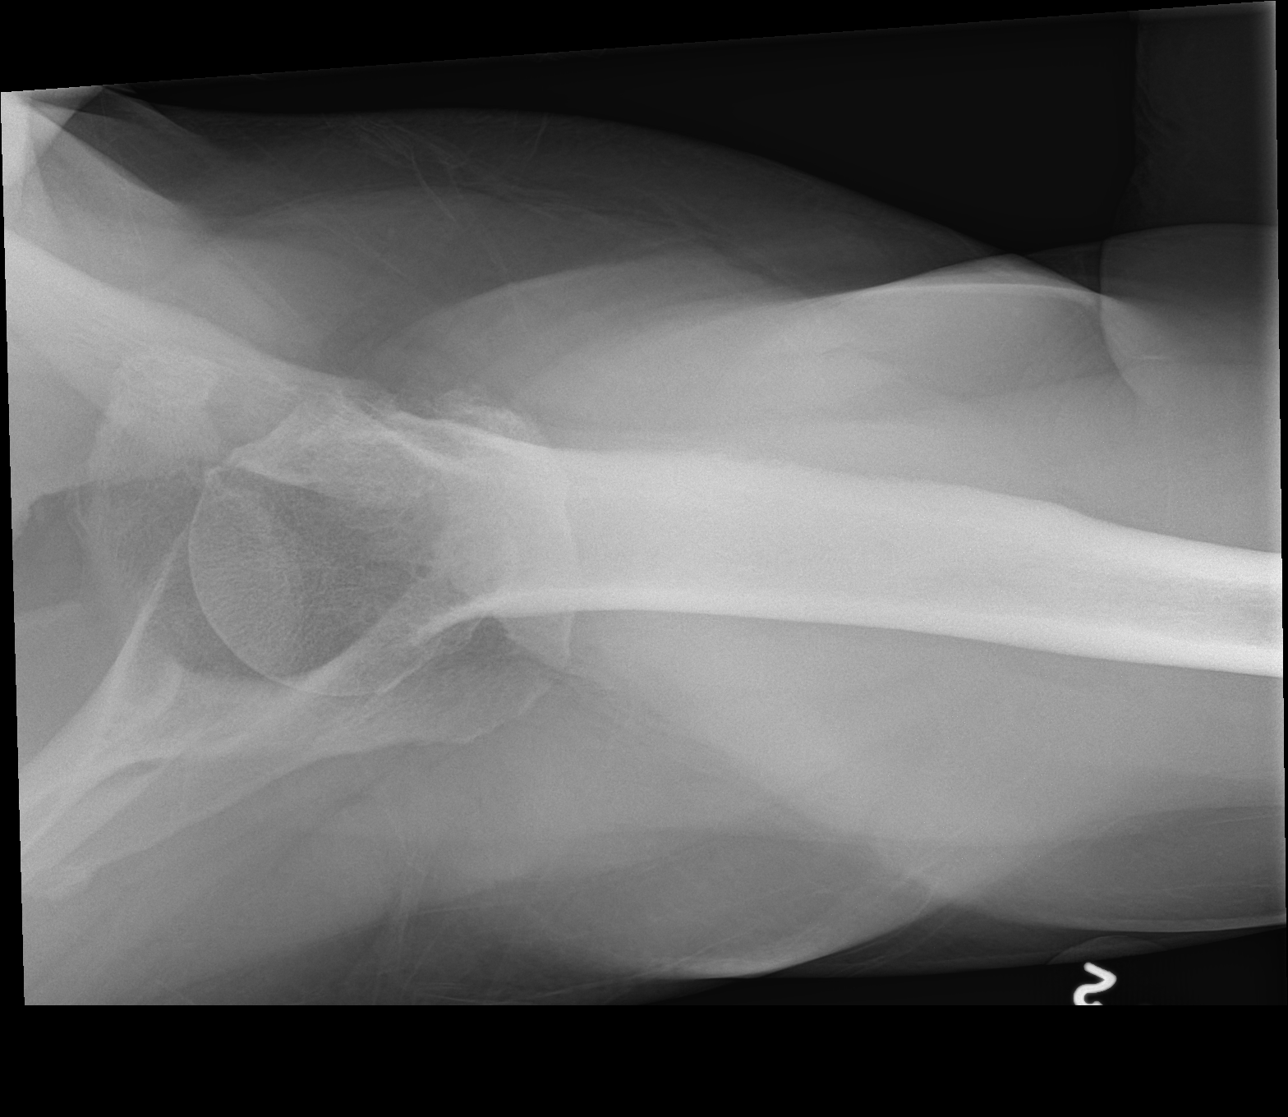

[3 of 3 positions shown; findings below may reference images not displayed]

FINDINGS: Mild glenohumeral osteoarthritis with small glenoid spurs. Inferior
spurring from the distal acromion. The alignment is maintained.
Minimal irregularity of the supraspinatus insertion on the humeral
head suggesting rotator cuff pathology. No abnormal soft tissue
calcifications.
IMPRESSION: Mild glenohumeral osteoarthritis.

Inferior acromial spurring.

Irregularity in the region of the supraspinatus insertion on the
humeral head suggesting rotator cuff pathology.

## 2017-04-13 ENCOUNTER — Inpatient Hospital Stay (HOSPITAL_COMMUNITY): Payer: Medicare Other | Attending: Adult Health

## 2017-04-13 DIAGNOSIS — D509 Iron deficiency anemia, unspecified: Secondary | ICD-10-CM

## 2017-04-13 DIAGNOSIS — D471 Chronic myeloproliferative disease: Secondary | ICD-10-CM

## 2017-04-13 DIAGNOSIS — C8330 Diffuse large B-cell lymphoma, unspecified site: Secondary | ICD-10-CM

## 2017-04-13 LAB — CBC WITH DIFFERENTIAL/PLATELET
BASOS PCT: 0 %
Basophils Absolute: 0 10*3/uL (ref 0.0–0.1)
EOS PCT: 0 %
Eosinophils Absolute: 0 10*3/uL (ref 0.0–0.7)
HCT: 36.7 % (ref 36.0–46.0)
Hemoglobin: 12.7 g/dL (ref 12.0–15.0)
LYMPHS PCT: 46 %
Lymphs Abs: 3.6 10*3/uL (ref 0.7–4.0)
MCH: 37.9 pg — ABNORMAL HIGH (ref 26.0–34.0)
MCHC: 34.6 g/dL (ref 30.0–36.0)
MCV: 109.6 fL — AB (ref 78.0–100.0)
Monocytes Absolute: 1 10*3/uL (ref 0.1–1.0)
Monocytes Relative: 13 %
NEUTROS ABS: 3.2 10*3/uL (ref 1.7–7.7)
Neutrophils Relative %: 41 %
PLATELETS: 461 10*3/uL — AB (ref 150–400)
RBC: 3.35 MIL/uL — AB (ref 3.87–5.11)
RDW: 14.1 % (ref 11.5–15.5)
WBC: 7.9 10*3/uL (ref 4.0–10.5)

## 2017-04-18 LAB — IRON AND TIBC

## 2017-04-18 LAB — FERRITIN

## 2017-04-20 ENCOUNTER — Encounter (HOSPITAL_COMMUNITY): Payer: Self-pay | Admitting: Adult Health

## 2017-04-20 NOTE — Progress Notes (Signed)
Lab results from lab core dated 04/13/17 received and reviewed.    The results are as follows:   TIBC: 318 ug/dL UIBC:  243 ug/dL Iron: 75 ug/dL Iron sats: 24% Ferritin: 27 ng/mL   No role for IV iron at this time. Her Hgb was normal at time of visit (04/13/17). Will keep monitoring for now.     These records will be sent to be scanned in to the patient's chart.       Mike Craze, NP [place] [phone]

## 2017-04-21 NOTE — Progress Notes (Signed)
Message left on patients voicemail regarding instructions and to call for any questions.

## 2017-05-05 IMAGING — DX DG CHEST 2V
2 series · 2 of 2 positions shown · non-contrast
Comparison: 04/24/2015

CLINICAL DATA: Dizziness, spinning, and leaning to the right for 3
days.

EXAM:
CHEST  2 VIEW

[chest pa]
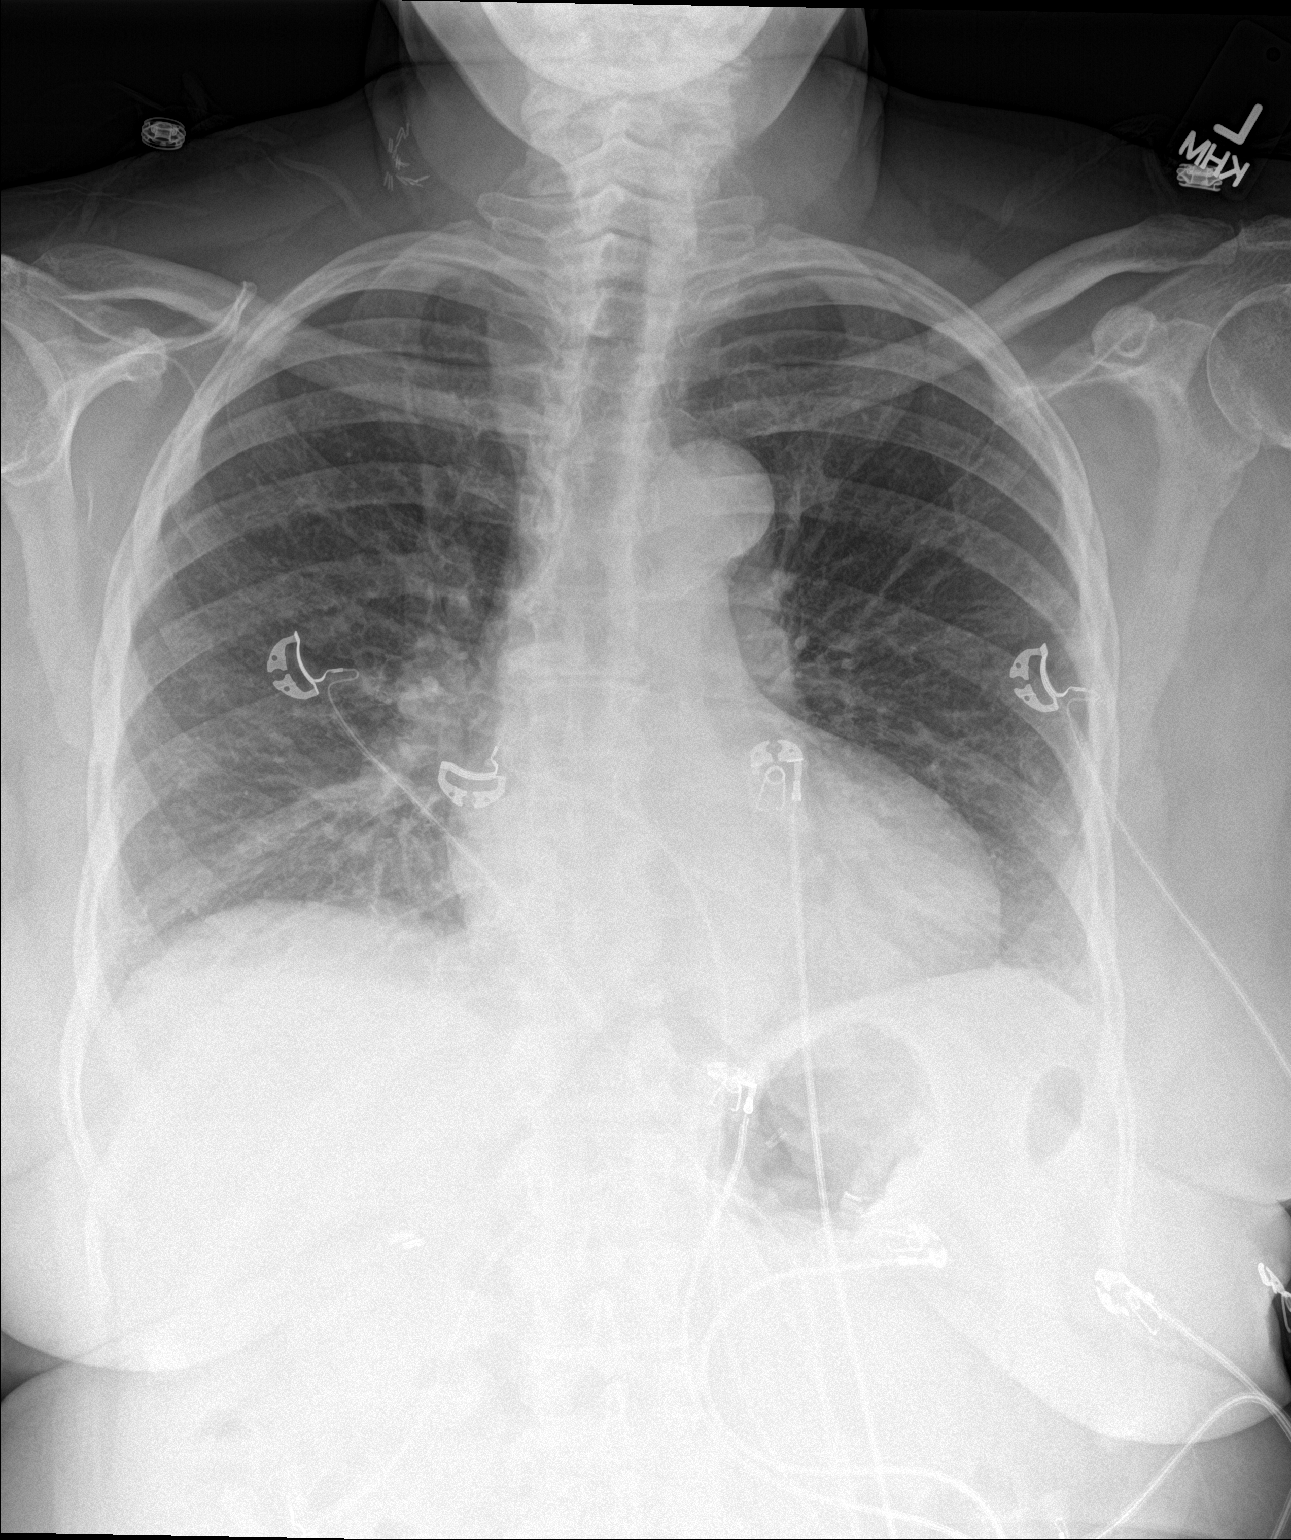

[chest lat]
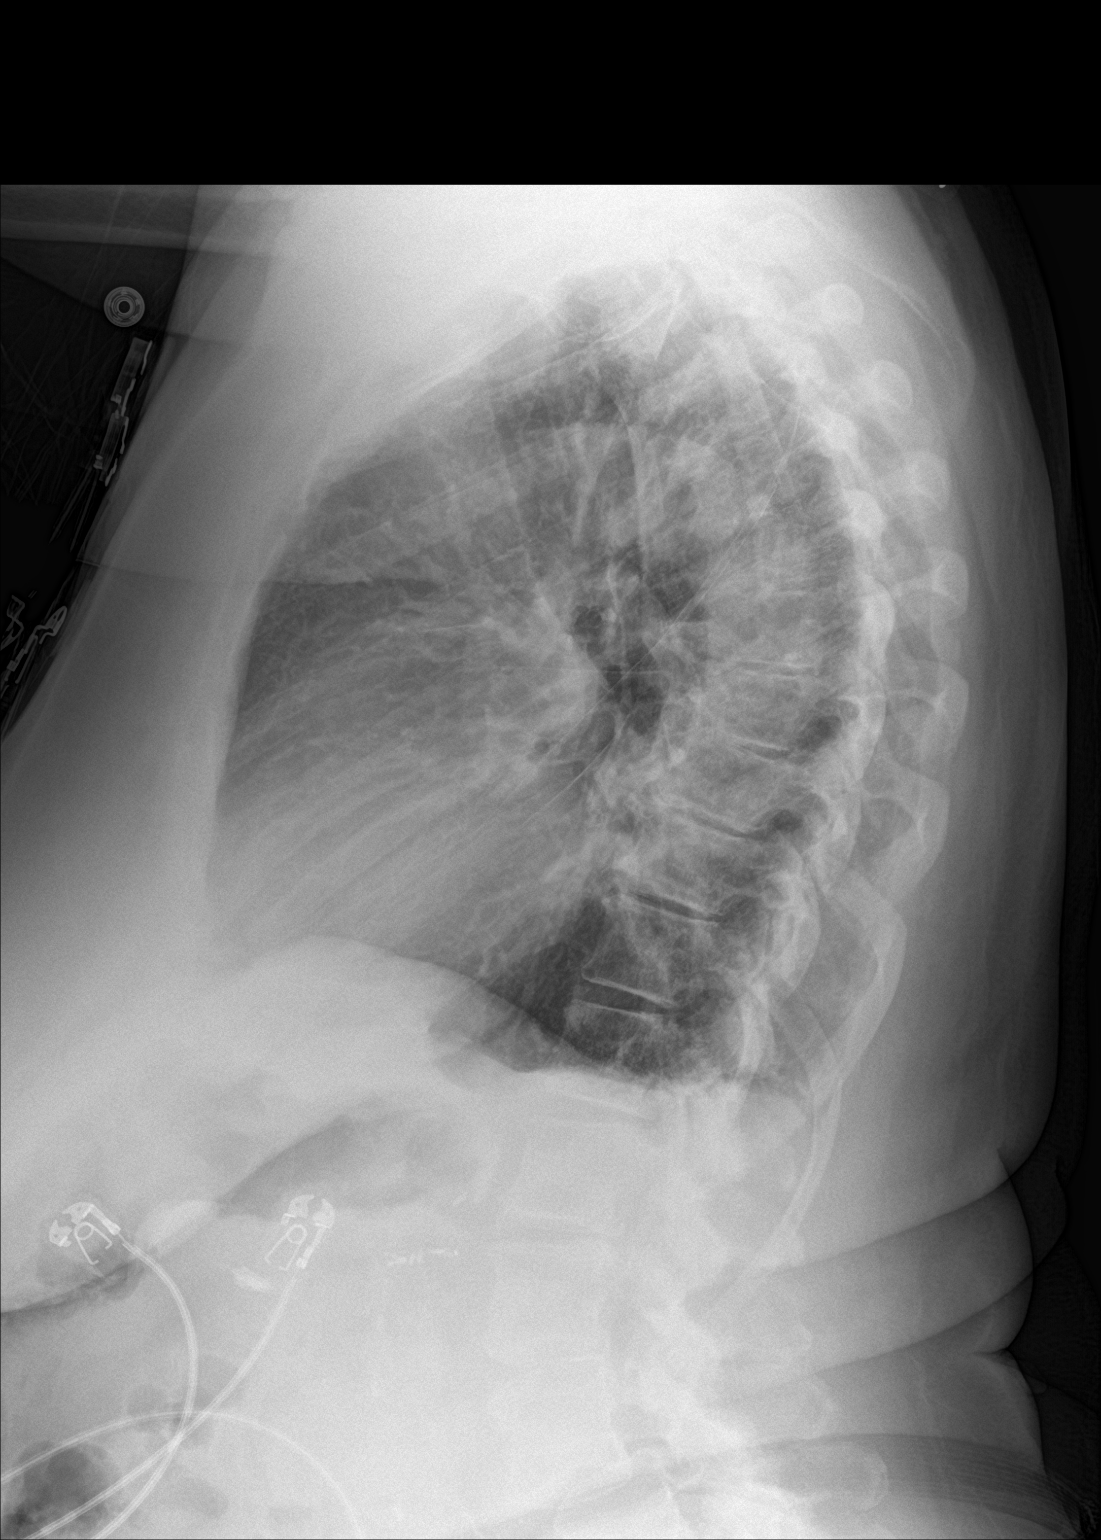

[2 of 2 positions shown; findings below may reference images not displayed]

FINDINGS: Mild cardiac enlargement. No pulmonary vascular congestion. Slight
interstitial changes in the lung bases are unchanged since prior
study and probably represent fibrosis. Interstitial pneumonitis not
excluded. No focal airspace disease or consolidation. No blunting of
costophrenic angles. No pneumothorax. Tortuous and calcified aorta.
Degenerative changes in the spine. Surgical clips in right upper
quadrant.
IMPRESSION: Cardiac enlargement. No consolidation or edema. Chronic interstitial
pattern to the lung bases.

## 2017-05-24 ENCOUNTER — Other Ambulatory Visit (HOSPITAL_COMMUNITY): Payer: Self-pay | Admitting: Internal Medicine

## 2017-05-24 DIAGNOSIS — R52 Pain, unspecified: Secondary | ICD-10-CM

## 2017-05-24 DIAGNOSIS — R350 Frequency of micturition: Secondary | ICD-10-CM

## 2017-05-27 ENCOUNTER — Ambulatory Visit (HOSPITAL_COMMUNITY): Payer: Medicare Other

## 2017-06-16 ENCOUNTER — Inpatient Hospital Stay (HOSPITAL_COMMUNITY): Payer: Medicare Other

## 2017-06-16 ENCOUNTER — Inpatient Hospital Stay (HOSPITAL_COMMUNITY): Payer: Medicare Other | Attending: Internal Medicine | Admitting: Internal Medicine

## 2017-06-16 ENCOUNTER — Other Ambulatory Visit: Payer: Self-pay

## 2017-06-16 ENCOUNTER — Encounter (HOSPITAL_COMMUNITY): Payer: Self-pay | Admitting: Internal Medicine

## 2017-06-16 VITALS — BP 114/60 | HR 98 | Temp 98.5°F | Resp 16 | Wt 166.8 lb

## 2017-06-16 DIAGNOSIS — E7800 Pure hypercholesterolemia, unspecified: Secondary | ICD-10-CM | POA: Diagnosis not present

## 2017-06-16 DIAGNOSIS — Z7982 Long term (current) use of aspirin: Secondary | ICD-10-CM | POA: Insufficient documentation

## 2017-06-16 DIAGNOSIS — D509 Iron deficiency anemia, unspecified: Secondary | ICD-10-CM

## 2017-06-16 DIAGNOSIS — I10 Essential (primary) hypertension: Secondary | ICD-10-CM | POA: Diagnosis not present

## 2017-06-16 DIAGNOSIS — F419 Anxiety disorder, unspecified: Secondary | ICD-10-CM | POA: Diagnosis not present

## 2017-06-16 DIAGNOSIS — E669 Obesity, unspecified: Secondary | ICD-10-CM | POA: Insufficient documentation

## 2017-06-16 DIAGNOSIS — D473 Essential (hemorrhagic) thrombocythemia: Secondary | ICD-10-CM | POA: Diagnosis not present

## 2017-06-16 DIAGNOSIS — D471 Chronic myeloproliferative disease: Secondary | ICD-10-CM

## 2017-06-16 DIAGNOSIS — I739 Peripheral vascular disease, unspecified: Secondary | ICD-10-CM | POA: Diagnosis not present

## 2017-06-16 DIAGNOSIS — Z86010 Personal history of colonic polyps: Secondary | ICD-10-CM | POA: Insufficient documentation

## 2017-06-16 DIAGNOSIS — Z87891 Personal history of nicotine dependence: Secondary | ICD-10-CM | POA: Diagnosis not present

## 2017-06-16 DIAGNOSIS — Z8572 Personal history of non-Hodgkin lymphomas: Secondary | ICD-10-CM | POA: Insufficient documentation

## 2017-06-16 DIAGNOSIS — Z79899 Other long term (current) drug therapy: Secondary | ICD-10-CM | POA: Diagnosis not present

## 2017-06-16 DIAGNOSIS — Z9081 Acquired absence of spleen: Secondary | ICD-10-CM | POA: Diagnosis not present

## 2017-06-16 DIAGNOSIS — Z9221 Personal history of antineoplastic chemotherapy: Secondary | ICD-10-CM | POA: Diagnosis not present

## 2017-06-16 DIAGNOSIS — C8330 Diffuse large B-cell lymphoma, unspecified site: Secondary | ICD-10-CM

## 2017-06-16 DIAGNOSIS — E559 Vitamin D deficiency, unspecified: Secondary | ICD-10-CM

## 2017-06-16 LAB — CBC WITH DIFFERENTIAL/PLATELET
Basophils Absolute: 0 10*3/uL (ref 0.0–0.1)
Basophils Relative: 0 %
EOS ABS: 0 10*3/uL (ref 0.0–0.7)
Eosinophils Relative: 1 %
HCT: 35.8 % — ABNORMAL LOW (ref 36.0–46.0)
HEMOGLOBIN: 11.9 g/dL — AB (ref 12.0–15.0)
Lymphocytes Relative: 40 %
Lymphs Abs: 3.1 10*3/uL (ref 0.7–4.0)
MCH: 35.8 pg — ABNORMAL HIGH (ref 26.0–34.0)
MCHC: 33.2 g/dL (ref 30.0–36.0)
MCV: 107.8 fL — ABNORMAL HIGH (ref 78.0–100.0)
Monocytes Absolute: 0.9 10*3/uL (ref 0.1–1.0)
Monocytes Relative: 11 %
NEUTROS PCT: 48 %
Neutro Abs: 3.8 10*3/uL (ref 1.7–7.7)
Platelets: 470 10*3/uL — ABNORMAL HIGH (ref 150–400)
RBC: 3.32 MIL/uL — AB (ref 3.87–5.11)
RDW: 14.6 % (ref 11.5–15.5)
WBC: 7.9 10*3/uL (ref 4.0–10.5)

## 2017-06-16 LAB — COMPREHENSIVE METABOLIC PANEL
ALT: 15 U/L (ref 14–54)
AST: 13 U/L — AB (ref 15–41)
Albumin: 3.6 g/dL (ref 3.5–5.0)
Alkaline Phosphatase: 67 U/L (ref 38–126)
Anion gap: 11 (ref 5–15)
BILIRUBIN TOTAL: 0.7 mg/dL (ref 0.3–1.2)
BUN: 16 mg/dL (ref 6–20)
CALCIUM: 9.3 mg/dL (ref 8.9–10.3)
CO2: 23 mmol/L (ref 22–32)
Chloride: 106 mmol/L (ref 101–111)
Creatinine, Ser: 0.95 mg/dL (ref 0.44–1.00)
GFR calc Af Amer: >60 mL/min (ref >60)
GFR, EST NON AFRICAN AMERICAN: 53 mL/min — AB (ref >60)
Glucose, Bld: 86 mg/dL (ref 65–99)
POTASSIUM: 3.8 mmol/L (ref 3.5–5.1)
Sodium: 140 mmol/L (ref 135–145)
TOTAL PROTEIN: 7 g/dL (ref 6.5–8.1)

## 2017-06-16 LAB — IRON AND TIBC
IRON: 62 ug/dL (ref 28–170)
Saturation Ratios: 19 % (ref 10.4–31.8)
TIBC: 328 ug/dL (ref 250–450)
UIBC: 266 ug/dL

## 2017-06-16 LAB — LACTATE DEHYDROGENASE: LDH: 131 U/L (ref 98–192)

## 2017-06-16 LAB — FERRITIN: FERRITIN: 14 ng/mL (ref 11–307)

## 2017-06-16 NOTE — Progress Notes (Signed)
Diagnosis JAK2+ MDP (myeloproliferative disorder) presenting with thrombocytosis ([place]) - Plan: CBC with Differential/Platelet, Comprehensive metabolic panel, Lactate dehydrogenase  Staging Cancer Staging No matching staging information was found for the patient.  Assessment and Plan:  1.  Myeloproliferative disorder/thrombocytosis.  Thrombocytosis felt secondary to previous splenectomy and JAK2 mutation.  Labs reviewed dating back to 2018 showed platelet count range from 400,000 to 460,000. She remains on Hydrea 500 mg a day.  Labs done today 06/16/2017 show white count 7.9 hemoglobin 11.9 platelets 470,000.  She will continue Hydrea as directed.  Patient will return to clinic in 6 months for follow-up and repeat labs.  She should notify the office if she has any problems prior to her next visit.  2.  Iron deficiency anemia: Recent ferritin was 39 in December 2018.  She was last treated with IV iron July 23, 2016.  Hemoglobin today is 11.9.  She will have repeat labs in 6 months.  3.  Diffuse large B-cell lymphoma.  Pt was diagnosed in 1999; s/p CHOP chemotherapy x 7 cycles, followed by Rituxan x 1 cycle. Post-treatment CT scans showed complete response.  No adenopathy noted on exam.  LDH 131.  She will return to clinic in 6 months for follow-up.  4.  Hypertension.  Blood pressure is 114/60.  Follow-up with PCP.  Interval History:  84 yr old female previously followed by Dr. Talbert Cage for myeloproliferative disorder/thrombocytosis, JAK2+ AND Diffuse Large B-Cell Lymphoma AND Iron deficiency anemia.  She is on Hydrea 500 mg p.o. daily.  Current Status: Patient is seen today for follow-up.  She is doing well denies any complaints other than darkening of her nails.     NHL (non-Hodgkin's lymphoma) ([place])   07/19/1995 Pathology Results    Spleen biopsy with resection- low-grade B-cell lymphoma, splenic marginal zone type      02/20/1998 Imaging    CT CAP-marked and extensive cervical adenopathy      02/25/1998 Pathology Results    Right cervical lymph node demonstrating large B-cell lymphoma      03/05/1998 Bone Marrow Biopsy    No evidence of bone marrow involvement      03/11/1998 - 07/18/1998 Chemotherapy    CHOP x 7 cycles      05/09/1998 Imaging    CT CAP and neck- slight interval decrease in size of para-aortic adenopathy with interval decrease in right inguinal and bi-iliac adenopathy.  Interval decrease in size and number of enlarged cervical nodes      07/10/1998 Imaging    Ct CAP and neck- unremarkable CT of chest. No enlarged retroperitoneal adenopathy withthe previously noted retroperitoneal nodes currently smaller to stable in size.  Cervical adenopathy is stable to slightly smaller in size.      08/18/1998 - 09/08/1998 Chemotherapy    Rituxan Day 1, 8, 15, 22 x 1 cycle      01/08/1999 Remission    CT CAP and neck demonstrates no adenopathy or disease      01/08/1999 Imaging    CT CAP and neck- Stable CT of chest. Stable CT abd/pelvis without adenopathy.  Negative CT of neck        Problem List Patient Active Problem List   Diagnosis Date Noted  . Labyrinthine vertigo [H81.09] 10/26/2015  . Elevated troponin [R74.8] 10/25/2015  . Chest pain [R07.9] 10/25/2015  . Vertigo [R42]   . S/P splenectomy [Z90.81] 01/01/2014  . Claudication of both lower extremities ([place]) [I73.9] 11/06/2013  . NHL (non-Hodgkin's lymphoma) ([place]) [C85.90] 12/17/2010  .  WEIGHT LOSS, ABNORMAL [R63.4] 02/05/2009  . COLONIC POLYPS, ADENOMATOUS, HX OF [Z86.010] 02/05/2009  . GERD [K21.9] 08/08/2008  . Iron deficiency anemia [D50.9] 06/12/2008  . ANXIETY [F41.1] 03/20/2007  . HYPERLIPIDEMIA [E78.5] 01/11/2006  . JAK2+ MDP (myeloproliferative disorder) presenting with thrombocytosis ([place]) [D47.1] 01/11/2006  . CARPAL TUNNEL SYNDROME [G56.00] 01/11/2006  . Essential hypertension [I10] 01/11/2006  . PVD- know Lt SFA disease, bilat PT and AT disease [I73.9] 01/11/2006  . ALLERGIC RHINITIS  [J30.9] 01/11/2006  . SEBORRHEIC KERATOSIS [L82.1] 01/11/2006    Past Medical History Past Medical History:  Diagnosis Date  . Anxiety   . Complication of anesthesia    difficult to wake up  . Depression   . Hypercholesteremia   . Hypertension   . Hypokalemia   . Iron deficiency anemia   . Left hip pain   . NHL (non-Hodgkin's lymphoma) ([employer]) 12/17/2010  . Obesity   . Primary thrombocytosis ([place]) 01/11/2006   Secondary to splenectomy    . PVD (peripheral vascular disease) ([place])   . S/P splenectomy 01/01/2014  . 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 Vertigo, labyrinthine     Past Surgical History Past Surgical History:  Procedure Laterality Date  . CATARACT EXTRACTION W/PHACO Left 11/03/2015   Procedure: CATARACT EXTRACTION PHACO AND INTRAOCULAR LENS PLACEMENT (IOC);  Surgeon: Tonny Branch, MD;  Location: [facility];  Service: Ophthalmology;  Laterality: Left;  CDE: 7.74  . CATARACT EXTRACTION W/PHACO Right 11/20/2015   Procedure: CATARACT EXTRACTION PHACO AND INTRAOCULAR LENS PLACEMENT ; CDE:  8.30;  Surgeon: Tonny Branch, MD;  Location: [facility];  Service: Ophthalmology;  Laterality: Right;  . PORT-A-CATH REMOVAL      Family History Family History  Problem Relation Age of Onset  . Diabetes Mother      Social History  reports that she has quit smoking. She has never used smokeless tobacco. She reports that she does not drink alcohol or use drugs.  Medications  Current Outpatient Medications:  .  ALPRAZolam (XANAX) 0.5 MG tablet, , Disp: , Rfl: 0 .  amLODipine (NORVASC) 2.5 MG tablet, Take 2.5 mg by mouth daily., Disp: , Rfl: 2 .  aspirin 81 MG tablet, Take 81 mg by mouth daily., Disp: , Rfl:  .  atorvastatin (LIPITOR) 40 MG tablet, Take 40 mg by mouth daily., Disp: , Rfl: 2 .  cilostazol (PLETAL) 100 MG tablet, Take 100 mg by mouth 2 (two) times daily., Disp: , Rfl:  .  diclofenac (CATAFLAM) 50 MG  tablet, Take 1 tablet (50 mg total) by mouth 2 (two) times daily., Disp: 60 tablet, Rfl: 2 .  hydroxyurea (HYDREA) 500 MG capsule, TAKE 1 CAPSULE BY MOUTH DAILY. MAY TAKE WITH FOOD TO MINIMIZE GI SIDEEFFECTS., Disp: 90 capsule, Rfl: 1 .  ibuprofen (ADVIL,MOTRIN) 800 MG tablet, Take 800 mg by mouth every 8 (eight) hours as needed for moderate pain. , Disp: , Rfl: 0 .  losartan-hydrochlorothiazide (HYZAAR) 100-12.5 MG per tablet, Take 1 tablet by mouth daily. Reported on 06/30/2015, Disp: , Rfl:  .  meclizine (ANTIVERT) 25 MG tablet, Take 1 tablet (25 mg total) by mouth 3 (three) times daily as needed for dizziness., Disp: 15 tablet, Rfl: 1 .  potassium chloride SA (K-DUR,KLOR-CON) 20 MEQ tablet, TAKE ONE TABLET BY MOUTH TWICE DAILY., Disp: 60 tablet, Rfl: 3  Allergies Penicillins  Review of Systems Review of Systems - Oncology ROS as per HPI otherwise 12  point ROS is negative.   Physical Exam  Vitals Wt Readings from Last 3 Encounters:  06/16/17 166 lb 12.8 oz (75.7 kg)  02/10/17 168 lb (76.2 kg)  10/13/16 169 lb 3.2 oz (76.7 kg)   Temp Readings from Last 3 Encounters:  06/16/17 98.5 F (36.9 C)  02/10/17 98.5 F (36.9 C) (Oral)  10/13/16 98.2 F (36.8 C) (Oral)   BP Readings from Last 3 Encounters:  06/16/17 114/60  02/10/17 119/77  10/13/16 126/84   Pulse Readings from Last 3 Encounters:  06/16/17 98  02/10/17 (!) 103  10/13/16 75    Constitutional: Well-developed, well-nourished, and in no distress.   HENT: Head: Normocephalic and atraumatic.  Mouth/Throat: No oropharyngeal exudate. Mucosa moist. Eyes: Pupils are equal, round, and reactive to light. Conjunctivae are normal. No scleral icterus.  Neck: Normal range of motion. Neck supple. No JVD present.  Cardiovascular: Normal rate, regular rhythm and normal heart sounds.  Exam reveals no gallop and no friction rub.   No murmur heard. Pulmonary/Chest: Effort normal and breath sounds normal. No respiratory distress. No  wheezes.No rales.  Abdominal: Soft. Bowel sounds are normal. No distension. There is no tenderness. There is no guarding.  Musculoskeletal: No edema or tenderness.  Lymphadenopathy: No cervical, axillary or supraclavicular adenopathy.  Neurological: Alert and oriented to person, place, and time. No cranial nerve deficit.  Skin: Skin is warm and dry. No rash noted. No erythema. No pallor.  Psychiatric: Affect and judgment normal.   Labs Appointment on 06/16/2017  Component Date Value Ref Range Status  . WBC 06/16/2017 7.9  4.0 - 10.5 K/uL Final  . RBC 06/16/2017 3.32* 3.87 - 5.11 MIL/uL Final  . Hemoglobin 06/16/2017 11.9* 12.0 - 15.0 g/dL Final  . HCT 06/16/2017 35.8* 36.0 - 46.0 % Final  . MCV 06/16/2017 107.8* 78.0 - 100.0 fL Final  . MCH 06/16/2017 35.8* 26.0 - 34.0 pg Final  . MCHC 06/16/2017 33.2  30.0 - 36.0 g/dL Final  . RDW 06/16/2017 14.6  11.5 - 15.5 % Final  . Platelets 06/16/2017 470* 150 - 400 K/uL Final  . Neutrophils Relative % 06/16/2017 48  % Final  . Neutro Abs 06/16/2017 3.8  1.7 - 7.7 K/uL Final  . Lymphocytes Relative 06/16/2017 40  % Final  . Lymphs Abs 06/16/2017 3.1  0.7 - 4.0 K/uL Final  . Monocytes Relative 06/16/2017 11  % Final  . Monocytes Absolute 06/16/2017 0.9  0.1 - 1.0 K/uL Final  . Eosinophils Relative 06/16/2017 1  % Final  . Eosinophils Absolute 06/16/2017 0.0  0.0 - 0.7 K/uL Final  . Basophils Relative 06/16/2017 0  % Final  . Basophils Absolute 06/16/2017 0.0  0.0 - 0.1 K/uL Final   Performed at [facility], [address] [place], [place] [address]  . LDH 06/16/2017 131  98 - 192 U/L Final   Performed at [facility], [address] [place], [place] [address]  . Sodium 06/16/2017 140  135 - 145 mmol/L Final  . Potassium 06/16/2017 3.8  3.5 - 5.1 mmol/L Final  . Chloride 06/16/2017 106  101 - 111 mmol/L Final  . CO2 06/16/2017 23  22 - 32 mmol/L Final  . Glucose, Bld 06/16/2017 86  65 - 99 mg/dL Final  . BUN 06/16/2017 16  6 - 20 mg/dL  Final  . Creatinine, Ser 06/16/2017 0.95  0.44 - 1.00 mg/dL Final  . Calcium 06/16/2017 9.3  8.9 - 10.3 mg/dL Final  . Total Protein 06/16/2017 7.0  6.5 - 8.1 g/dL Final  . Albumin 06/16/2017 3.6  3.5 - 5.0 g/dL Final  . AST 06/16/2017 13* 15 - 41 U/L Final  . ALT 06/16/2017 15  14 - 54 U/L Final  . Alkaline Phosphatase 06/16/2017 67  38 - 126 U/L Final  . Total Bilirubin 06/16/2017 0.7  0.3 - 1.2 mg/dL Final  . GFR calc non Af Amer 06/16/2017 53* >60 mL/min Final  . GFR calc Af Amer 06/16/2017 >60  >60 mL/min Final   Comment: (NOTE) The eGFR has been calculated using the CKD EPI equation. This calculation has not been validated in all clinical situations. eGFR's persistently <60 mL/min signify possible Chronic Kidney Disease.   Georgiann Hahn gap 06/16/2017 11  5 - 15 Final   Performed at [facility], [address], [place], [place] [address]     Pathology Orders Placed This Encounter  Procedures  . CBC with Differential/Platelet    Standing Status:   Future    Standing Expiration Date:   06/17/2018  . Comprehensive metabolic panel    Standing Status:   Future    Standing Expiration Date:   06/17/2018  . Lactate dehydrogenase    Standing Status:   Future    Standing Expiration Date:   06/17/2018       Zoila Shutter MD

## 2017-06-16 NOTE — Patient Instructions (Signed)
[facility] at [facility] Discharge Instructions  Seen by Dr. Higgs today Follow up as scheduled.  Thank you for choosing [facility] at [facility] to provide your oncology and hematology care.  To afford each patient quality time with our provider, please arrive at least 15 minutes before your scheduled appointment time.   If you have a lab appointment with the Cancer Center please come in thru the  Main Entrance and check in at the main information desk  You need to re-schedule your appointment should you arrive 10 or more minutes late.  We strive to give you quality time with our providers, and arriving late affects you and other patients whose appointments are after yours.  Also, if you no show three or more times for appointments you may be dismissed from the clinic at the providers discretion.     Again, thank you for choosing [facility].  Our hope is that these requests will decrease the amount of time that you wait before being seen by our physicians.       _____________________________________________________________  Should you have questions after your visit to [facility], please contact our office at [phone] between the hours of 8:30 a.m. and 4:30 p.m.  Voicemails left after 4:30 p.m. will not be returned until the following business day.  For prescription refill requests, have your pharmacy contact our office.       Resources For Cancer Patients and their Caregivers ? American Cancer Society: Can assist with transportation, wigs, general needs, runs Look Good Feel Better.        1-888-227-6333 ? Cancer Care: Provides financial assistance, online support groups, medication/co-pay assistance.  1-800-813-HOPE (4673) ? [facility] Assists [place] cancer patients and their families through emotional , educational and financial support.  336-427-4357 ? [place]  DSS Where to apply for food stamps, Medicaid and utility assistance. 336-342-1394 ? [facility]: Transportation to medical appointments. 336-347-2287 ? Social Security Administration: May apply for disability if have a Stage IV cancer. 336-342-7796 1-800-772-1213 ? [place] Aging, Disability and Transit Services: Assists with nutrition, care and transit needs. 336-349-2343  Cancer Center Support Programs:   > Cancer Support Group  2nd Tuesday of the month 1pm-2pm, Journey Room   > Creative Journey  3rd Tuesday of the month 1130am-1pm, Journey Room    

## 2017-06-17 LAB — VITAMIN D 25 HYDROXY (VIT D DEFICIENCY, FRACTURES): Vit D, 25-Hydroxy: 76.1 ng/mL (ref 30.0–100.0)

## 2017-06-23 ENCOUNTER — Other Ambulatory Visit (HOSPITAL_COMMUNITY): Payer: Self-pay | Admitting: Adult Health

## 2017-06-23 DIAGNOSIS — D471 Chronic myeloproliferative disease: Secondary | ICD-10-CM

## 2017-07-05 DIAGNOSIS — C8330 Diffuse large B-cell lymphoma, unspecified site: Secondary | ICD-10-CM | POA: Diagnosis not present

## 2017-07-05 DIAGNOSIS — D471 Chronic myeloproliferative disease: Secondary | ICD-10-CM | POA: Diagnosis not present

## 2017-07-05 DIAGNOSIS — I739 Peripheral vascular disease, unspecified: Secondary | ICD-10-CM | POA: Diagnosis not present

## 2017-07-05 DIAGNOSIS — I10 Essential (primary) hypertension: Secondary | ICD-10-CM | POA: Diagnosis not present

## 2017-08-01 DIAGNOSIS — B351 Tinea unguium: Secondary | ICD-10-CM | POA: Diagnosis not present

## 2017-08-01 DIAGNOSIS — M79675 Pain in left toe(s): Secondary | ICD-10-CM | POA: Diagnosis not present

## 2017-08-01 DIAGNOSIS — I739 Peripheral vascular disease, unspecified: Secondary | ICD-10-CM | POA: Diagnosis not present

## 2017-08-01 DIAGNOSIS — M79674 Pain in right toe(s): Secondary | ICD-10-CM | POA: Diagnosis not present

## 2017-08-03 ENCOUNTER — Other Ambulatory Visit (HOSPITAL_COMMUNITY): Payer: Self-pay | Admitting: Adult Health

## 2017-08-03 DIAGNOSIS — E876 Hypokalemia: Secondary | ICD-10-CM

## 2017-08-05 ENCOUNTER — Other Ambulatory Visit (HOSPITAL_COMMUNITY): Payer: Self-pay | Admitting: Adult Health

## 2017-08-05 DIAGNOSIS — E876 Hypokalemia: Secondary | ICD-10-CM

## 2017-09-21 ENCOUNTER — Other Ambulatory Visit (HOSPITAL_COMMUNITY): Payer: Self-pay | Admitting: *Deleted

## 2017-09-21 DIAGNOSIS — D471 Chronic myeloproliferative disease: Secondary | ICD-10-CM

## 2017-09-21 MED ORDER — HYDROXYUREA 500 MG PO CAPS: ORAL_CAPSULE | ORAL | 3 refills | Status: DC

## 2017-10-11 DIAGNOSIS — D471 Chronic myeloproliferative disease: Secondary | ICD-10-CM | POA: Diagnosis not present

## 2017-10-11 DIAGNOSIS — Z0001 Encounter for general adult medical examination with abnormal findings: Secondary | ICD-10-CM | POA: Diagnosis not present

## 2017-10-11 DIAGNOSIS — I739 Peripheral vascular disease, unspecified: Secondary | ICD-10-CM | POA: Diagnosis not present

## 2017-10-11 DIAGNOSIS — I10 Essential (primary) hypertension: Secondary | ICD-10-CM | POA: Diagnosis not present

## 2017-10-12 DIAGNOSIS — C8330 Diffuse large B-cell lymphoma, unspecified site: Secondary | ICD-10-CM | POA: Diagnosis not present

## 2017-10-12 DIAGNOSIS — I10 Essential (primary) hypertension: Secondary | ICD-10-CM | POA: Diagnosis not present

## 2017-10-12 DIAGNOSIS — E663 Overweight: Secondary | ICD-10-CM | POA: Diagnosis not present

## 2017-10-12 DIAGNOSIS — D509 Iron deficiency anemia, unspecified: Secondary | ICD-10-CM | POA: Diagnosis not present

## 2017-10-31 DIAGNOSIS — L03031 Cellulitis of right toe: Secondary | ICD-10-CM | POA: Diagnosis not present

## 2017-10-31 DIAGNOSIS — L03032 Cellulitis of left toe: Secondary | ICD-10-CM | POA: Diagnosis not present

## 2017-10-31 DIAGNOSIS — I739 Peripheral vascular disease, unspecified: Secondary | ICD-10-CM | POA: Diagnosis not present

## 2017-10-31 DIAGNOSIS — L600 Ingrowing nail: Secondary | ICD-10-CM | POA: Diagnosis not present

## 2017-12-09 ENCOUNTER — Inpatient Hospital Stay (HOSPITAL_COMMUNITY): Payer: Medicare Other | Attending: Hematology

## 2017-12-09 DIAGNOSIS — Z7982 Long term (current) use of aspirin: Secondary | ICD-10-CM | POA: Insufficient documentation

## 2017-12-09 DIAGNOSIS — D473 Essential (hemorrhagic) thrombocythemia: Secondary | ICD-10-CM | POA: Diagnosis not present

## 2017-12-09 DIAGNOSIS — F419 Anxiety disorder, unspecified: Secondary | ICD-10-CM | POA: Insufficient documentation

## 2017-12-09 DIAGNOSIS — K219 Gastro-esophageal reflux disease without esophagitis: Secondary | ICD-10-CM | POA: Insufficient documentation

## 2017-12-09 DIAGNOSIS — E785 Hyperlipidemia, unspecified: Secondary | ICD-10-CM | POA: Diagnosis not present

## 2017-12-09 DIAGNOSIS — E7800 Pure hypercholesterolemia, unspecified: Secondary | ICD-10-CM | POA: Insufficient documentation

## 2017-12-09 DIAGNOSIS — D471 Chronic myeloproliferative disease: Secondary | ICD-10-CM | POA: Diagnosis present

## 2017-12-09 DIAGNOSIS — Z9221 Personal history of antineoplastic chemotherapy: Secondary | ICD-10-CM | POA: Insufficient documentation

## 2017-12-09 DIAGNOSIS — Z79899 Other long term (current) drug therapy: Secondary | ICD-10-CM | POA: Diagnosis not present

## 2017-12-09 DIAGNOSIS — E669 Obesity, unspecified: Secondary | ICD-10-CM | POA: Insufficient documentation

## 2017-12-09 DIAGNOSIS — Z8572 Personal history of non-Hodgkin lymphomas: Secondary | ICD-10-CM | POA: Insufficient documentation

## 2017-12-09 DIAGNOSIS — R079 Chest pain, unspecified: Secondary | ICD-10-CM | POA: Diagnosis not present

## 2017-12-09 DIAGNOSIS — Z87891 Personal history of nicotine dependence: Secondary | ICD-10-CM | POA: Diagnosis not present

## 2017-12-09 DIAGNOSIS — D509 Iron deficiency anemia, unspecified: Secondary | ICD-10-CM | POA: Diagnosis not present

## 2017-12-09 DIAGNOSIS — I10 Essential (primary) hypertension: Secondary | ICD-10-CM | POA: Insufficient documentation

## 2017-12-09 DIAGNOSIS — I739 Peripheral vascular disease, unspecified: Secondary | ICD-10-CM | POA: Insufficient documentation

## 2017-12-09 DIAGNOSIS — F329 Major depressive disorder, single episode, unspecified: Secondary | ICD-10-CM | POA: Diagnosis not present

## 2017-12-09 DIAGNOSIS — Z9081 Acquired absence of spleen: Secondary | ICD-10-CM | POA: Insufficient documentation

## 2017-12-09 LAB — COMPREHENSIVE METABOLIC PANEL
ALBUMIN: 3.6 g/dL (ref 3.5–5.0)
ALK PHOS: 67 U/L (ref 38–126)
ALT: 12 U/L (ref 0–44)
ANION GAP: 7 (ref 5–15)
AST: 14 U/L — ABNORMAL LOW (ref 15–41)
BILIRUBIN TOTAL: 0.5 mg/dL (ref 0.3–1.2)
BUN: 20 mg/dL (ref 8–23)
CALCIUM: 9 mg/dL (ref 8.9–10.3)
CO2: 23 mmol/L (ref 22–32)
Chloride: 109 mmol/L (ref 98–111)
Creatinine, Ser: 0.98 mg/dL (ref 0.44–1.00)
GFR calc Af Amer: 60 mL/min — ABNORMAL LOW (ref >60)
GFR, EST NON AFRICAN AMERICAN: 52 mL/min — AB (ref >60)
Glucose, Bld: 125 mg/dL — ABNORMAL HIGH (ref 70–99)
POTASSIUM: 3.5 mmol/L (ref 3.5–5.1)
Sodium: 139 mmol/L (ref 135–145)
TOTAL PROTEIN: 7.1 g/dL (ref 6.5–8.1)

## 2017-12-09 LAB — CBC WITH DIFFERENTIAL/PLATELET
ABS IMMATURE GRANULOCYTES: 0.02 10*3/uL (ref 0.00–0.07)
Basophils Absolute: 0 10*3/uL (ref 0.0–0.1)
Basophils Relative: 0 %
EOS PCT: 1 %
Eosinophils Absolute: 0.1 10*3/uL (ref 0.0–0.5)
HEMATOCRIT: 35.7 % — AB (ref 36.0–46.0)
HEMOGLOBIN: 11.4 g/dL — AB (ref 12.0–15.0)
IMMATURE GRANULOCYTES: 0 %
LYMPHS ABS: 2.9 10*3/uL (ref 0.7–4.0)
LYMPHS PCT: 38 %
MCH: 34.2 pg — ABNORMAL HIGH (ref 26.0–34.0)
MCHC: 31.9 g/dL (ref 30.0–36.0)
MCV: 107.2 fL — ABNORMAL HIGH (ref 80.0–100.0)
Monocytes Absolute: 0.8 10*3/uL (ref 0.1–1.0)
Monocytes Relative: 11 %
NEUTROS ABS: 3.8 10*3/uL (ref 1.7–7.7)
Neutrophils Relative %: 50 %
Platelets: 427 10*3/uL — ABNORMAL HIGH (ref 150–400)
RBC: 3.33 MIL/uL — ABNORMAL LOW (ref 3.87–5.11)
RDW: 16.9 % — ABNORMAL HIGH (ref 11.5–15.5)
WBC: 7.6 10*3/uL (ref 4.0–10.5)
nRBC: 0 % (ref 0.0–0.2)

## 2017-12-09 LAB — LACTATE DEHYDROGENASE: LDH: 144 U/L (ref 98–192)

## 2017-12-16 ENCOUNTER — Encounter (HOSPITAL_COMMUNITY): Payer: Self-pay | Admitting: Internal Medicine

## 2017-12-16 ENCOUNTER — Other Ambulatory Visit: Payer: Self-pay

## 2017-12-16 ENCOUNTER — Other Ambulatory Visit (HOSPITAL_COMMUNITY): Payer: Self-pay | Admitting: Internal Medicine

## 2017-12-16 ENCOUNTER — Inpatient Hospital Stay (HOSPITAL_COMMUNITY): Payer: Medicare Other | Admitting: Internal Medicine

## 2017-12-16 VITALS — BP 101/55 | HR 100 | Temp 98.4°F | Resp 18 | Wt 164.4 lb

## 2017-12-16 DIAGNOSIS — Z8572 Personal history of non-Hodgkin lymphomas: Secondary | ICD-10-CM | POA: Diagnosis not present

## 2017-12-16 DIAGNOSIS — E785 Hyperlipidemia, unspecified: Secondary | ICD-10-CM

## 2017-12-16 DIAGNOSIS — K219 Gastro-esophageal reflux disease without esophagitis: Secondary | ICD-10-CM

## 2017-12-16 DIAGNOSIS — D473 Essential (hemorrhagic) thrombocythemia: Secondary | ICD-10-CM | POA: Diagnosis not present

## 2017-12-16 DIAGNOSIS — F329 Major depressive disorder, single episode, unspecified: Secondary | ICD-10-CM | POA: Diagnosis not present

## 2017-12-16 DIAGNOSIS — Z79899 Other long term (current) drug therapy: Secondary | ICD-10-CM

## 2017-12-16 DIAGNOSIS — Z9081 Acquired absence of spleen: Secondary | ICD-10-CM

## 2017-12-16 DIAGNOSIS — I10 Essential (primary) hypertension: Secondary | ICD-10-CM

## 2017-12-16 DIAGNOSIS — E669 Obesity, unspecified: Secondary | ICD-10-CM

## 2017-12-16 DIAGNOSIS — D509 Iron deficiency anemia, unspecified: Secondary | ICD-10-CM | POA: Diagnosis not present

## 2017-12-16 DIAGNOSIS — F419 Anxiety disorder, unspecified: Secondary | ICD-10-CM

## 2017-12-16 DIAGNOSIS — Z7982 Long term (current) use of aspirin: Secondary | ICD-10-CM

## 2017-12-16 DIAGNOSIS — R079 Chest pain, unspecified: Secondary | ICD-10-CM

## 2017-12-16 DIAGNOSIS — Z9221 Personal history of antineoplastic chemotherapy: Secondary | ICD-10-CM

## 2017-12-16 DIAGNOSIS — I739 Peripheral vascular disease, unspecified: Secondary | ICD-10-CM

## 2017-12-16 DIAGNOSIS — D471 Chronic myeloproliferative disease: Secondary | ICD-10-CM

## 2017-12-16 DIAGNOSIS — Z87891 Personal history of nicotine dependence: Secondary | ICD-10-CM | POA: Diagnosis not present

## 2017-12-16 DIAGNOSIS — E7800 Pure hypercholesterolemia, unspecified: Secondary | ICD-10-CM

## 2017-12-16 MED ORDER — HYDROXYUREA 500 MG PO CAPS: ORAL_CAPSULE | ORAL | 3 refills | Status: DC

## 2017-12-16 NOTE — Patient Instructions (Signed)
[facility] at [facility] Discharge Instructions  Today you saw Dr. Higgs   Thank you for choosing [facility] at [facility] to provide your oncology and hematology care.  To afford each patient quality time with our provider, please arrive at least 15 minutes before your scheduled appointment time.   If you have a lab appointment with the Cancer Center please come in thru the  Main Entrance and check in at the main information desk  You need to re-schedule your appointment should you arrive 10 or more minutes late.  We strive to give you quality time with our providers, and arriving late affects you and other patients whose appointments are after yours.  Also, if you no show three or more times for appointments you may be dismissed from the clinic at the providers discretion.     Again, thank you for choosing [facility].  Our hope is that these requests will decrease the amount of time that you wait before being seen by our physicians.       _____________________________________________________________  Should you have questions after your visit to [facility], please contact our office at [phone] between the hours of 8:00 a.m. and 4:30 p.m.  Voicemails left after 4:00 p.m. will not be returned until the following business day.  For prescription refill requests, have your pharmacy contact our office and allow 72 hours.    Cancer Center Support Programs:   > Cancer Support Group  2nd Tuesday of the month 1pm-2pm, Journey Room   

## 2017-12-16 NOTE — Progress Notes (Signed)
Diagnosis JAK2+ MDP (myeloproliferative disorder) presenting with thrombocytosis ([place]) - Plan: CBC with Differential/Platelet, Comprehensive metabolic panel, Lactate dehydrogenase, Ferritin, hydroxyurea (HYDREA) 500 MG capsule  Iron deficiency anemia, unspecified iron deficiency anemia type - Plan: CBC with Differential/Platelet, Comprehensive metabolic panel, Lactate dehydrogenase, Ferritin  Staging Cancer Staging No matching staging information was found for the patient.  Assessment and Plan:  1.  Myeloproliferative disorder/thrombocytosis.  Thrombocytosis felt secondary to previous splenectomy and JAK2 mutation.  Labs reviewed dating back to 2018 showed platelet count range from 400,000 to 460,000. She remains on Hydrea 500 mg a day.    Labs done 10.18/2019 reviewed and showed WBC 7.6 HB 11.4 plts 427,000.  This is improved from plt count of 470,000 done 06/16/2017.  She will continue Hydrea as directed.  Patient will return to clinic in 04/2018 for follow-up and repeat labs.  She should notify the office if she has any problems prior to her next visit.  2.  Iron deficiency anemia.  She was last treated with IV iron July 23, 2016.  Hemoglobin on labs done 12/09/2017 was 11.4.  She will have repeat labs in 04/2018.    3.  Diffuse large B-cell lymphoma.  Pt was diagnosed in 1999; s/p CHOP chemotherapy x 7 cycles, followed by Rituxan x 1 cycle. Post-treatment CT scans showed complete response.  No adenopathy noted on exam.  LDH 144.   She will return to clinic in 04/2018 for follow-up.  4.  Hypertension.  Blood pressure is 101/55.  Follow-up with PCP.  Current Status: Patient is seen today for follow-up.  She is doing well denies any complaints.  She needs refills on Hydrea.      NHL (non-Hodgkin's lymphoma) ([place])   07/19/1995 Pathology Results    Spleen biopsy with resection- low-grade B-cell lymphoma, splenic marginal zone type    02/20/1998 Imaging    CT CAP-marked and extensive cervical  adenopathy    02/25/1998 Pathology Results    Right cervical lymph node demonstrating large B-cell lymphoma    03/05/1998 Bone Marrow Biopsy    No evidence of bone marrow involvement    03/11/1998 - 07/18/1998 Chemotherapy    CHOP x 7 cycles    05/09/1998 Imaging    CT CAP and neck- slight interval decrease in size of para-aortic adenopathy with interval decrease in right inguinal and bi-iliac adenopathy.  Interval decrease in size and number of enlarged cervical nodes    07/10/1998 Imaging    Ct CAP and neck- unremarkable CT of chest. No enlarged retroperitoneal adenopathy withthe previously noted retroperitoneal nodes currently smaller to stable in size.  Cervical adenopathy is stable to slightly smaller in size.    08/18/1998 - 09/08/1998 Chemotherapy    Rituxan Day 1, 8, 15, 22 x 1 cycle    01/08/1999 Remission    CT CAP and neck demonstrates no adenopathy or disease    01/08/1999 Imaging    CT CAP and neck- Stable CT of chest. Stable CT abd/pelvis without adenopathy.  Negative CT of neck      Problem List Patient Active Problem List   Diagnosis Date Noted  . Labyrinthine vertigo [H81.09] 10/26/2015  . Elevated troponin [R79.89] 10/25/2015  . Chest pain [R07.9] 10/25/2015  . Vertigo [R42]   . S/P splenectomy [Z90.81] 01/01/2014  . Claudication of both lower extremities ([place]) [I73.9] 11/06/2013  . NHL (non-Hodgkin's lymphoma) ([place]) [C85.90] 12/17/2010  . WEIGHT LOSS, ABNORMAL [R63.4] 02/05/2009  . COLONIC POLYPS, ADENOMATOUS, HX OF [Z86.010] 02/05/2009  . GERD [K21.9]  08/08/2008  . Iron deficiency anemia [D50.9] 06/12/2008  . ANXIETY [F41.1] 03/20/2007  . HYPERLIPIDEMIA [E78.5] 01/11/2006  . JAK2+ MDP (myeloproliferative disorder) presenting with thrombocytosis ([place]) [D47.1] 01/11/2006  . CARPAL TUNNEL SYNDROME [G56.00] 01/11/2006  . Essential hypertension [I10] 01/11/2006  . PVD- know Lt SFA disease, bilat PT and AT disease [I73.9] 01/11/2006  . ALLERGIC RHINITIS [J30.9]  01/11/2006  . SEBORRHEIC KERATOSIS [L82.1] 01/11/2006    Past Medical History Past Medical History:  Diagnosis Date  . Anxiety   . Complication of anesthesia    difficult to wake up  . Depression   . Hypercholesteremia   . Hypertension   . Hypokalemia   . Iron deficiency anemia   . Left hip pain   . NHL (non-Hodgkin's lymphoma) ([place]) 12/17/2010  . Obesity   . Primary thrombocytosis ([place]) 01/11/2006   Secondary to splenectomy    . PVD (peripheral vascular disease) ([place])   . S/P splenectomy 01/01/2014  . 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 Vertigo, labyrinthine     Past Surgical History Past Surgical History:  Procedure Laterality Date  . CATARACT EXTRACTION W/PHACO Left 11/03/2015   Procedure: CATARACT EXTRACTION PHACO AND INTRAOCULAR LENS PLACEMENT (IOC);  Surgeon: Tonny Branch, MD;  Location: [facility];  Service: Ophthalmology;  Laterality: Left;  CDE: 7.74  . CATARACT EXTRACTION W/PHACO Right 11/20/2015   Procedure: CATARACT EXTRACTION PHACO AND INTRAOCULAR LENS PLACEMENT ; CDE:  8.30;  Surgeon: Tonny Branch, MD;  Location: [facility];  Service: Ophthalmology;  Laterality: Right;  . PORT-A-CATH REMOVAL      Family History Family History  Problem Relation Age of Onset  . Diabetes Mother      Social History  reports that she has quit smoking. She has never used smokeless tobacco. She reports that she does not drink alcohol or use drugs.  Medications  Current Outpatient Medications:  .  ALPRAZolam (XANAX) 0.5 MG tablet, Take 0.5 mg by mouth 2 (two) times daily as needed. , Disp: , Rfl: 0 .  amLODipine (NORVASC) 2.5 MG tablet, Take 2.5 mg by mouth daily., Disp: , Rfl: 2 .  aspirin 81 MG tablet, Take 81 mg by mouth daily., Disp: , Rfl:  .  atorvastatin (LIPITOR) 40 MG tablet, Take 40 mg by mouth daily., Disp: , Rfl: 2 .  cilostazol (PLETAL) 100 MG tablet, Take 100 mg by mouth 2 (two) times daily.,  Disp: , Rfl:  .  diclofenac (CATAFLAM) 50 MG tablet, Take 1 tablet (50 mg total) by mouth 2 (two) times daily., Disp: 60 tablet, Rfl: 2 .  hydroxyurea (HYDREA) 500 MG capsule, May take with food to minimize GI side effects., Disp: 90 capsule, Rfl: 3 .  ibuprofen (ADVIL,MOTRIN) 800 MG tablet, Take 800 mg by mouth every 8 (eight) hours as needed for moderate pain. , Disp: , Rfl: 0 .  losartan-hydrochlorothiazide (HYZAAR) 100-12.5 MG per tablet, Take 1 tablet by mouth daily. Reported on 06/30/2015, Disp: , Rfl:  .  meclizine (ANTIVERT) 25 MG tablet, Take 1 tablet (25 mg total) by mouth 3 (three) times daily as needed for dizziness., Disp: 15 tablet, Rfl: 1 .  potassium chloride SA (K-DUR,KLOR-CON) 20 MEQ tablet, TAKE ONE TABLET BY MOUTH TWICE DAILY., Disp: 60 tablet, Rfl: 3  Allergies Penicillins  Review of Systems Review of Systems - Oncology ROS negative   Physical Exam  Vitals Wt Readings from Last 3 Encounters:  12/16/17 164 lb  6.4 oz (74.6 kg)  06/16/17 166 lb 12.8 oz (75.7 kg)  02/10/17 168 lb (76.2 kg)   Temp Readings from Last 3 Encounters:  12/16/17 98.4 F (36.9 C) (Oral)  06/16/17 98.5 F (36.9 C)  02/10/17 98.5 F (36.9 C) (Oral)   BP Readings from Last 3 Encounters:  12/16/17 (!) 101/55  06/16/17 114/60  02/10/17 119/77   Pulse Readings from Last 3 Encounters:  12/16/17 100  06/16/17 98  02/10/17 (!) 103    Constitutional: Well-developed, well-nourished, and in no distress.   HENT: Head: Normocephalic and atraumatic.  Mouth/Throat: No oropharyngeal exudate. Mucosa moist. Eyes: Pupils are equal, round, and reactive to light. Conjunctivae are normal. No scleral icterus.  Neck: Normal range of motion. Neck supple. No JVD present.  Cardiovascular: Normal rate, regular rhythm and normal heart sounds.  Exam reveals no gallop and no friction rub.   No murmur heard. Pulmonary/Chest: Effort normal and breath sounds normal. No respiratory distress. No wheezes.No rales.   Abdominal: Soft. Bowel sounds are normal. No distension. There is no tenderness. There is no guarding.  Musculoskeletal: No edema or tenderness.  Lymphadenopathy: No cervical, axillary  or supraclavicular adenopathy.  Neurological: Alert and oriented to person, place, and time. No cranial nerve deficit.  Skin: Skin is warm and dry. No rash noted. No erythema. No pallor.  Psychiatric: Affect and judgment normal.   Labs No visits with results within 3 Day(s) from this visit.  Latest known visit with results is:  Appointment on 12/09/2017  Component Date Value Ref Range Status  . WBC 12/09/2017 7.6  4.0 - 10.5 K/uL Final  . RBC 12/09/2017 3.33* 3.87 - 5.11 MIL/uL Final  . Hemoglobin 12/09/2017 11.4* 12.0 - 15.0 g/dL Final  . HCT 12/09/2017 35.7* 36.0 - 46.0 % Final  . MCV 12/09/2017 107.2* 80.0 - 100.0 fL Final  . MCH 12/09/2017 34.2* 26.0 - 34.0 pg Final  . MCHC 12/09/2017 31.9  30.0 - 36.0 g/dL Final  . RDW 12/09/2017 16.9* 11.5 - 15.5 % Final  . Platelets 12/09/2017 427* 150 - 400 K/uL Final  . nRBC 12/09/2017 0.0  0.0 - 0.2 % Final  . Neutrophils Relative % 12/09/2017 50  % Final  . Neutro Abs 12/09/2017 3.8  1.7 - 7.7 K/uL Final  . Lymphocytes Relative 12/09/2017 38  % Final  . Lymphs Abs 12/09/2017 2.9  0.7 - 4.0 K/uL Final  . Monocytes Relative 12/09/2017 11  % Final  . Monocytes Absolute 12/09/2017 0.8  0.1 - 1.0 K/uL Final  . Eosinophils Relative 12/09/2017 1  % Final  . Eosinophils Absolute 12/09/2017 0.1  0.0 - 0.5 K/uL Final  . Basophils Relative 12/09/2017 0  % Final  . Basophils Absolute 12/09/2017 0.0  0.0 - 0.1 K/uL Final  . Immature Granulocytes 12/09/2017 0  % Final  . Abs Immature Granulocytes 12/09/2017 0.02  0.00 - 0.07 K/uL Final   Performed at [facility], [address], [place], [place] [address]  . Sodium 12/09/2017 139  135 - 145 mmol/L Final  . Potassium 12/09/2017 3.5  3.5 - 5.1 mmol/L Final  . Chloride 12/09/2017 109  98 - 111 mmol/L Final  . CO2  12/09/2017 23  22 - 32 mmol/L Final  . Glucose, Bld 12/09/2017 125* 70 - 99 mg/dL Final  . BUN 12/09/2017 20  8 - 23 mg/dL Final  . Creatinine, Ser 12/09/2017 0.98  0.44 - 1.00 mg/dL Final  . Calcium 12/09/2017 9.0  8.9 - 10.3 mg/dL  Final  . Total Protein 12/09/2017 7.1  6.5 - 8.1 g/dL Final  . Albumin 12/09/2017 3.6  3.5 - 5.0 g/dL Final  . AST 12/09/2017 14* 15 - 41 U/L Final  . ALT 12/09/2017 12  0 - 44 U/L Final  . Alkaline Phosphatase 12/09/2017 67  38 - 126 U/L Final  . Total Bilirubin 12/09/2017 0.5  0.3 - 1.2 mg/dL Final  . GFR calc non Af Amer 12/09/2017 52* >60 mL/min Final  . GFR calc Af Amer 12/09/2017 60* >60 mL/min Final   Comment: (NOTE) The eGFR has been calculated using the CKD EPI equation. This calculation has not been validated in all clinical situations. eGFR's persistently <60 mL/min signify possible Chronic Kidney Disease.   Georgiann Hahn gap 12/09/2017 7  5 - 15 Final   Performed at [facility], [address] [place], [place] [address]  . LDH 12/09/2017 144  98 - 192 U/L Final   Performed at [facility], [address], [place], [place] [address]     Pathology Orders Placed This Encounter  Procedures  . CBC with Differential/Platelet    Standing Status:   Future    Standing Expiration Date:   12/17/2018  . Comprehensive metabolic panel    Standing Status:   Future    Standing Expiration Date:   12/17/2018  . Lactate dehydrogenase    Standing Status:   Future    Standing Expiration Date:   12/17/2018  . Ferritin    Standing Status:   Future    Standing Expiration Date:   12/17/2018       Zoila Shutter MD

## 2018-01-12 DIAGNOSIS — C8330 Diffuse large B-cell lymphoma, unspecified site: Secondary | ICD-10-CM | POA: Diagnosis not present

## 2018-01-12 DIAGNOSIS — I10 Essential (primary) hypertension: Secondary | ICD-10-CM | POA: Diagnosis not present

## 2018-01-12 DIAGNOSIS — R42 Dizziness and giddiness: Secondary | ICD-10-CM | POA: Diagnosis not present

## 2018-01-12 DIAGNOSIS — D471 Chronic myeloproliferative disease: Secondary | ICD-10-CM | POA: Diagnosis not present

## 2018-04-18 DIAGNOSIS — I10 Essential (primary) hypertension: Secondary | ICD-10-CM | POA: Diagnosis not present

## 2018-04-18 DIAGNOSIS — D471 Chronic myeloproliferative disease: Secondary | ICD-10-CM | POA: Diagnosis not present

## 2018-04-18 DIAGNOSIS — C8330 Diffuse large B-cell lymphoma, unspecified site: Secondary | ICD-10-CM | POA: Diagnosis not present

## 2018-04-28 DIAGNOSIS — D471 Chronic myeloproliferative disease: Secondary | ICD-10-CM | POA: Diagnosis not present

## 2018-04-28 DIAGNOSIS — J209 Acute bronchitis, unspecified: Secondary | ICD-10-CM | POA: Diagnosis not present

## 2018-05-08 ENCOUNTER — Inpatient Hospital Stay (HOSPITAL_COMMUNITY): Payer: Medicare Other | Attending: Hematology

## 2018-05-08 ENCOUNTER — Other Ambulatory Visit: Payer: Self-pay

## 2018-05-08 DIAGNOSIS — Z8572 Personal history of non-Hodgkin lymphomas: Secondary | ICD-10-CM | POA: Insufficient documentation

## 2018-05-08 DIAGNOSIS — C946 Myelodysplastic disease, not classified: Secondary | ICD-10-CM | POA: Insufficient documentation

## 2018-05-08 DIAGNOSIS — Z79899 Other long term (current) drug therapy: Secondary | ICD-10-CM | POA: Insufficient documentation

## 2018-05-08 DIAGNOSIS — Z9221 Personal history of antineoplastic chemotherapy: Secondary | ICD-10-CM | POA: Diagnosis not present

## 2018-05-08 DIAGNOSIS — Z87891 Personal history of nicotine dependence: Secondary | ICD-10-CM | POA: Diagnosis not present

## 2018-05-08 DIAGNOSIS — E7800 Pure hypercholesterolemia, unspecified: Secondary | ICD-10-CM | POA: Diagnosis not present

## 2018-05-08 DIAGNOSIS — I739 Peripheral vascular disease, unspecified: Secondary | ICD-10-CM | POA: Insufficient documentation

## 2018-05-08 DIAGNOSIS — H8109 Meniere's disease, unspecified ear: Secondary | ICD-10-CM | POA: Insufficient documentation

## 2018-05-08 DIAGNOSIS — D509 Iron deficiency anemia, unspecified: Secondary | ICD-10-CM | POA: Insufficient documentation

## 2018-05-08 DIAGNOSIS — E669 Obesity, unspecified: Secondary | ICD-10-CM | POA: Insufficient documentation

## 2018-05-08 DIAGNOSIS — Z7982 Long term (current) use of aspirin: Secondary | ICD-10-CM | POA: Diagnosis not present

## 2018-05-08 DIAGNOSIS — F329 Major depressive disorder, single episode, unspecified: Secondary | ICD-10-CM | POA: Insufficient documentation

## 2018-05-08 DIAGNOSIS — I10 Essential (primary) hypertension: Secondary | ICD-10-CM | POA: Insufficient documentation

## 2018-05-08 DIAGNOSIS — R5383 Other fatigue: Secondary | ICD-10-CM | POA: Diagnosis not present

## 2018-05-08 DIAGNOSIS — E876 Hypokalemia: Secondary | ICD-10-CM | POA: Diagnosis not present

## 2018-05-08 DIAGNOSIS — D471 Chronic myeloproliferative disease: Secondary | ICD-10-CM

## 2018-05-08 LAB — CBC WITH DIFFERENTIAL/PLATELET
Abs Immature Granulocytes: 0.02 10*3/uL (ref 0.00–0.07)
BASOS PCT: 0 %
Basophils Absolute: 0 10*3/uL (ref 0.0–0.1)
Eosinophils Absolute: 0.1 10*3/uL (ref 0.0–0.5)
Eosinophils Relative: 1 %
HCT: 34 % — ABNORMAL LOW (ref 36.0–46.0)
Hemoglobin: 10.5 g/dL — ABNORMAL LOW (ref 12.0–15.0)
IMMATURE GRANULOCYTES: 0 %
LYMPHS PCT: 32 %
Lymphs Abs: 2.7 10*3/uL (ref 0.7–4.0)
MCH: 32.5 pg (ref 26.0–34.0)
MCHC: 30.9 g/dL (ref 30.0–36.0)
MCV: 105.3 fL — AB (ref 80.0–100.0)
MONO ABS: 0.8 10*3/uL (ref 0.1–1.0)
MONOS PCT: 10 %
NEUTROS ABS: 4.8 10*3/uL (ref 1.7–7.7)
NEUTROS PCT: 57 %
PLATELETS: 460 10*3/uL — AB (ref 150–400)
RBC: 3.23 MIL/uL — ABNORMAL LOW (ref 3.87–5.11)
RDW: 16.3 % — ABNORMAL HIGH (ref 11.5–15.5)
WBC: 8.5 10*3/uL (ref 4.0–10.5)
nRBC: 0 % (ref 0.0–0.2)

## 2018-05-08 LAB — LACTATE DEHYDROGENASE: LDH: 151 U/L (ref 98–192)

## 2018-05-08 LAB — COMPREHENSIVE METABOLIC PANEL
ALT: 12 U/L (ref 0–44)
AST: 17 U/L (ref 15–41)
Albumin: 3.5 g/dL (ref 3.5–5.0)
Alkaline Phosphatase: 62 U/L (ref 38–126)
Anion gap: 7 (ref 5–15)
BUN: 13 mg/dL (ref 8–23)
CHLORIDE: 111 mmol/L (ref 98–111)
CO2: 23 mmol/L (ref 22–32)
Calcium: 9.1 mg/dL (ref 8.9–10.3)
Creatinine, Ser: 0.84 mg/dL (ref 0.44–1.00)
Glucose, Bld: 127 mg/dL — ABNORMAL HIGH (ref 70–99)
POTASSIUM: 3.6 mmol/L (ref 3.5–5.1)
Sodium: 141 mmol/L (ref 135–145)
Total Bilirubin: 0.5 mg/dL (ref 0.3–1.2)
Total Protein: 6.9 g/dL (ref 6.5–8.1)

## 2018-05-08 LAB — FERRITIN: FERRITIN: 7 ng/mL — AB (ref 11–307)

## 2018-05-15 ENCOUNTER — Inpatient Hospital Stay (HOSPITAL_BASED_OUTPATIENT_CLINIC_OR_DEPARTMENT_OTHER): Payer: Medicare Other | Admitting: Nurse Practitioner

## 2018-05-15 ENCOUNTER — Encounter (HOSPITAL_COMMUNITY): Payer: Self-pay | Admitting: Nurse Practitioner

## 2018-05-15 VITALS — BP 143/75 | HR 89 | Temp 98.8°F | Wt 168.1 lb

## 2018-05-15 DIAGNOSIS — D471 Chronic myeloproliferative disease: Secondary | ICD-10-CM

## 2018-05-15 DIAGNOSIS — Z87891 Personal history of nicotine dependence: Secondary | ICD-10-CM

## 2018-05-15 DIAGNOSIS — F329 Major depressive disorder, single episode, unspecified: Secondary | ICD-10-CM | POA: Diagnosis not present

## 2018-05-15 DIAGNOSIS — H8109 Meniere's disease, unspecified ear: Secondary | ICD-10-CM

## 2018-05-15 DIAGNOSIS — Z79899 Other long term (current) drug therapy: Secondary | ICD-10-CM

## 2018-05-15 DIAGNOSIS — E876 Hypokalemia: Secondary | ICD-10-CM | POA: Diagnosis not present

## 2018-05-15 DIAGNOSIS — C8330 Diffuse large B-cell lymphoma, unspecified site: Secondary | ICD-10-CM

## 2018-05-15 DIAGNOSIS — D509 Iron deficiency anemia, unspecified: Secondary | ICD-10-CM

## 2018-05-15 DIAGNOSIS — Z8572 Personal history of non-Hodgkin lymphomas: Secondary | ICD-10-CM

## 2018-05-15 DIAGNOSIS — C649 Malignant neoplasm of unspecified kidney, except renal pelvis: Secondary | ICD-10-CM | POA: Diagnosis not present

## 2018-05-15 DIAGNOSIS — Z9221 Personal history of antineoplastic chemotherapy: Secondary | ICD-10-CM | POA: Diagnosis not present

## 2018-05-15 DIAGNOSIS — E669 Obesity, unspecified: Secondary | ICD-10-CM | POA: Diagnosis not present

## 2018-05-15 DIAGNOSIS — I739 Peripheral vascular disease, unspecified: Secondary | ICD-10-CM | POA: Diagnosis not present

## 2018-05-15 DIAGNOSIS — E7800 Pure hypercholesterolemia, unspecified: Secondary | ICD-10-CM | POA: Diagnosis not present

## 2018-05-15 DIAGNOSIS — R5383 Other fatigue: Secondary | ICD-10-CM | POA: Diagnosis not present

## 2018-05-15 DIAGNOSIS — Z7982 Long term (current) use of aspirin: Secondary | ICD-10-CM

## 2018-05-15 DIAGNOSIS — I10 Essential (primary) hypertension: Secondary | ICD-10-CM | POA: Diagnosis not present

## 2018-05-15 DIAGNOSIS — C946 Myelodysplastic disease, not classified: Secondary | ICD-10-CM | POA: Diagnosis not present

## 2018-05-15 NOTE — Assessment & Plan Note (Addendum)
1.  Myeloproliferative disorder/thrombocytosis: - Thrombocytosis and is felt secondary to previous splenectomy and Jak 2 mutation. - Labs in 2008 showed platelet count ranged from 400,000-460,000. -Labs on 05/08/2018 showed her platelet count at 460. - She is on Hydrea 500 mg daily.  She is tolerating the medication well with no side effects. -She will continue the Hydrea and we will follow-up in 4 months with repeat labs.  2.  Iron deficiency anemia: -She tried oral iron and could not tolerate it due to constipation. - She last had 1 infusion of Feraheme on 07/23/2016. - Her labs on 05/08/2018 are hemoglobin 10.5 and ferritin is 7. - I recommend she have 1 infusion of Feraheme. - She will return in 4 months with repeat labs prior.  3.  Diffuse large B-cell lymphoma: - Patient was diagnosed in 1999; status post CHOP therapy x7 cycles followed by Rituxan x1 cycle. - Posttreatment CT scan showed complete response. - Labs on 05/08/2018 showed LDH was 151. -No adenopathy on assessment. - She will return in 4 months with repeat labs.

## 2018-05-15 NOTE — Patient Instructions (Signed)
[place] at [facility] Discharge Instructions  Follow up in 4 months with labs prior to your visit. We will set you up with 1 infusion of iron.   Thank you for choosing [place] at [facility] to provide your oncology and hematology care.  To afford each patient quality time with our provider, please arrive at least 15 minutes before your scheduled appointment time.   If you have a lab appointment with the [facility] please come in thru the  Main Entrance and check in at the main information desk  You need to re-schedule your appointment should you arrive 10 or more minutes late.  We strive to give you quality time with our providers, and arriving late affects you and other patients whose appointments are after yours.  Also, if you no show three or more times for appointments you may be dismissed from the clinic at the providers discretion.     Again, thank you for choosing [facility].  Our hope is that these requests will decrease the amount of time that you wait before being seen by our physicians.       _____________________________________________________________  Should you have questions after your visit to [facility], please contact our office at [phone] between the hours of 8:00 a.m. and 4:30 p.m.  Voicemails left after 4:00 p.m. will not be returned until the following business day.  For prescription refill requests, have your pharmacy contact our office and allow 72 hours.    Cancer Center Support Programs:   > Cancer Support Group  2nd Tuesday of the month 1pm-2pm, Journey Room

## 2018-05-15 NOTE — Progress Notes (Signed)
[place], [place] [address]   CLINIC:  Medical Oncology/Hematology  PCP:  Rosita Fire, MD [place] [id number] [phone]   REASON FOR VISIT: Follow-up for thrombocytosis Jak 2+ and iron deficiency anemia  CURRENT THERAPY: Hydrea 500 mg daily and intermittent iron infusions  BRIEF ONCOLOGIC HISTORY:    NHL (non-Hodgkin's lymphoma) ([place])   07/19/1995 Pathology Results    Spleen biopsy with resection- low-grade B-cell lymphoma, splenic marginal zone type    02/20/1998 Imaging    CT CAP-marked and extensive cervical adenopathy    02/25/1998 Pathology Results    Right cervical lymph node demonstrating large B-cell lymphoma    03/05/1998 Bone Marrow Biopsy    No evidence of bone marrow involvement    03/11/1998 - 07/18/1998 Chemotherapy    CHOP x 7 cycles    05/09/1998 Imaging    CT CAP and neck- slight interval decrease in size of para-aortic adenopathy with interval decrease in right inguinal and bi-iliac adenopathy.  Interval decrease in size and number of enlarged cervical nodes    07/10/1998 Imaging    Ct CAP and neck- unremarkable CT of chest. No enlarged retroperitoneal adenopathy withthe previously noted retroperitoneal nodes currently smaller to stable in size.  Cervical adenopathy is stable to slightly smaller in size.    08/18/1998 - 09/08/1998 Chemotherapy    Rituxan Day 1, 8, 15, 22 x 1 cycle    01/08/1999 Remission    CT CAP and neck demonstrates no adenopathy or disease    01/08/1999 Imaging    CT CAP and neck- Stable CT of chest. Stable CT abd/pelvis without adenopathy.  Negative CT of neck     INTERVAL HISTORY:  Ms. Lesko 85 y.o. female returns for routine follow-up myeloproliferative disorder and iron deficiency anemia.  Patient is here alone today.  She is doing well and tolerating her Hydrea daily with no side effects.  She reports she had a cold back at the beginning of March and has  recovered well.  She still coughs at night and has cough medicine for this.  She is feeling fatigued lately and she is not having enough energy to complete all of her household activities. Denies any nausea, vomiting, or diarrhea. Denies any new pains. Had not noticed any recent bleeding such as epistaxis, hematuria or hematochezia. Denies recent chest pain on exertion, shortness of breath on minimal exertion, pre-syncopal episodes, or palpitations. Denies any numbness or tingling in hands or feet. Denies any recent fevers, infections, or recent hospitalizations. Patient reports appetite at 100% and energy level at 75%.  She is eating well and maintaining her weight at this time.   REVIEW OF SYSTEMS:  Review of Systems  Constitutional: Positive for fatigue.  All other systems reviewed and are negative.    PAST MEDICAL/SURGICAL HISTORY:  Past Medical History:  Diagnosis Date  . Anxiety   . Complication of anesthesia    difficult to wake up  . Depression   . Hypercholesteremia   . Hypertension   . Hypokalemia   . Iron deficiency anemia   . Left hip pain   . NHL (non-Hodgkin's lymphoma) ([place]) 12/17/2010  . Obesity   . Primary thrombocytosis ([place]) 01/11/2006   Secondary to splenectomy    . PVD (peripheral vascular disease) ([place])   . S/P splenectomy 01/01/2014  . 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 Vertigo,  labyrinthine    Past Surgical History:  Procedure Laterality Date  . CATARACT EXTRACTION W/PHACO Left 11/03/2015   Procedure: CATARACT EXTRACTION PHACO AND INTRAOCULAR LENS PLACEMENT (IOC);  Surgeon: Tonny Branch, MD;  Location: [facility];  Service: Ophthalmology;  Laterality: Left;  CDE: 7.74  . CATARACT EXTRACTION W/PHACO Right 11/20/2015   Procedure: CATARACT EXTRACTION PHACO AND INTRAOCULAR LENS PLACEMENT ; CDE:  8.30;  Surgeon: Tonny Branch, MD;  Location: [facility];  Service: Ophthalmology;  Laterality: Right;   . PORT-A-CATH REMOVAL       SOCIAL HISTORY:  Social History   Socioeconomic History  . Marital status: Widowed    Spouse name: Not on file  . Number of children: Not on file  . Years of education: Not on file  . Highest education level: Not on file  Occupational History  . Not on file  Social Needs  . Financial resource strain: Not on file  . Food insecurity:    Worry: Not on file    Inability: Not on file  . Transportation needs:    Medical: Not on file    Non-medical: Not on file  Tobacco Use  . Smoking status: Former Research scientist (life sciences)  . Smokeless tobacco: Never Used  Substance and Sexual Activity  . Alcohol use: No  . Drug use: No  . Sexual activity: Never  Lifestyle  . Physical activity:    Days per week: Not on file    Minutes per session: Not on file  . Stress: Not on file  Relationships  . Social connections:    Talks on phone: Not on file    Gets together: Not on file    Attends religious service: Not on file    Active member of club or organization: Not on file    Attends meetings of clubs or organizations: Not on file    Relationship status: Not on file  . Intimate partner violence:    Fear of current or ex partner: Not on file    Emotionally abused: Not on file    Physically abused: Not on file    Forced sexual activity: Not on file  Other Topics Concern  . Not on file  Social History Narrative  . Not on file    FAMILY HISTORY:  Family History  Problem Relation Age of Onset  . Diabetes Mother     CURRENT MEDICATIONS:  Outpatient Encounter Medications as of 05/15/2018  Medication Sig Note  . amLODipine (NORVASC) 2.5 MG tablet Take 2.5 mg by mouth daily. 01/10/2015: Received from: External Pharmacy Received Sig:   . aspirin 81 MG tablet Take 81 mg by mouth daily.   Marland Kitchen atorvastatin (LIPITOR) 40 MG tablet Take 40 mg by mouth daily. 01/10/2015: Received from: External Pharmacy Received Sig:   . cilostazol (PLETAL) 100 MG tablet Take 100 mg by mouth 2  (two) times daily.   . hydroxyurea (HYDREA) 500 MG capsule May take with food to minimize GI side effects.   Marland Kitchen ibuprofen (ADVIL,MOTRIN) 800 MG tablet Take 800 mg by mouth every 8 (eight) hours as needed for moderate pain.  03/11/2015: Received from: External Pharmacy  . losartan-hydrochlorothiazide (HYZAAR) 100-12.5 MG per tablet Take 1 tablet by mouth daily. Reported on 06/30/2015   . potassium chloride SA (K-DUR,KLOR-CON) 20 MEQ tablet TAKE ONE TABLET BY MOUTH TWICE DAILY.   . vitamin B-12 (CYANOCOBALAMIN) 1000 MCG tablet Take 1,000 mcg by mouth daily.   Marland Kitchen ALPRAZolam (XANAX) 0.5 MG tablet Take 0.5 mg by mouth 2 (two)  times daily as needed.  12/16/2017: Patient hasn't taken this in 6 months  . diclofenac (CATAFLAM) 50 MG tablet Take 1 tablet (50 mg total) by mouth 2 (two) times daily. (Patient not taking: Reported on 05/15/2018)   . meclizine (ANTIVERT) 25 MG tablet Take 1 tablet (25 mg total) by mouth 3 (three) times daily as needed for dizziness. (Patient not taking: Reported on 05/15/2018)    No facility-administered encounter medications on file as of 05/15/2018.     ALLERGIES:  Allergies  Allergen Reactions  . Penicillins Rash     PHYSICAL EXAM:  ECOG Performance status: 1  Vitals:   05/15/18 1433  BP: (!) 143/75  Pulse: 89  Temp: 98.8 F (37.1 C)  SpO2: 98%   Filed Weights   05/15/18 1433  Weight: 168 lb 1.6 oz (76.2 kg)    Physical Exam Constitutional:      Appearance: Normal appearance. She is normal weight.  Cardiovascular:     Rate and Rhythm: Normal rate and regular rhythm.     Heart sounds: Normal heart sounds.  Pulmonary:     Effort: Pulmonary effort is normal.     Breath sounds: Normal breath sounds.  Abdominal:     General: Abdomen is flat. Bowel sounds are normal.     Palpations: Abdomen is soft.  Musculoskeletal: Normal range of motion.  Skin:    General: Skin is warm and dry.  Neurological:     Mental Status: She is alert and oriented to person, place,  and time. Mental status is at baseline.  Psychiatric:        Mood and Affect: Mood normal.        Behavior: Behavior normal.        Thought Content: Thought content normal.        Judgment: Judgment normal.      LABORATORY DATA:  I have reviewed the labs as listed.  CBC    Component Value Date/Time   WBC 8.5 05/08/2018 1438   RBC 3.23 (L) 05/08/2018 1438   HGB 10.5 (L) 05/08/2018 1438   HCT 34.0 (L) 05/08/2018 1438   PLT 460 (H) 05/08/2018 1438   MCV 105.3 (H) 05/08/2018 1438   MCH 32.5 05/08/2018 1438   MCHC 30.9 05/08/2018 1438   RDW 16.3 (H) 05/08/2018 1438   LYMPHSABS 2.7 05/08/2018 1438   MONOABS 0.8 05/08/2018 1438   EOSABS 0.1 05/08/2018 1438   BASOSABS 0.0 05/08/2018 1438   CMP Latest Ref Rng & Units 05/08/2018 12/09/2017 06/16/2017  Glucose 70 - 99 mg/dL 127(H) 125(H) 86  BUN 8 - 23 mg/dL 13 20 16   Creatinine 0.44 - 1.00 mg/dL 0.84 0.98 0.95  Sodium 135 - 145 mmol/L 141 139 140  Potassium 3.5 - 5.1 mmol/L 3.6 3.5 3.8  Chloride 98 - 111 mmol/L 111 109 106  CO2 22 - 32 mmol/L 23 23 23   Calcium 8.9 - 10.3 mg/dL 9.1 9.0 9.3  Total Protein 6.5 - 8.1 g/dL 6.9 7.1 7.0  Total Bilirubin 0.3 - 1.2 mg/dL 0.5 0.5 0.7  Alkaline Phos 38 - 126 U/L 62 67 67  AST 15 - 41 U/L 17 14(L) 13(L)  ALT 0 - 44 U/L 12 12 15     .  I personally performed a face-to-face visit, made revisions and my assessment and plan is as follows.    ASSESSMENT & PLAN:   Iron deficiency anemia 1.  Myeloproliferative disorder/thrombocytosis: - Thrombocytosis and is felt secondary to previous splenectomy and Jak 2 mutation. -  Labs in 2008 showed platelet count ranged from 400,000-460,000. -Labs on 05/08/2018 showed her platelet count at 460. - She is on Hydrea 500 mg daily.  She is tolerating the medication well with no side effects. -She will continue the Hydrea and we will follow-up in 4 months with repeat labs.  2.  Iron deficiency anemia: -She tried oral iron and could not tolerate it due to  constipation. - She last had 1 infusion of Feraheme on 07/23/2016. - Her labs on 05/08/2018 are hemoglobin 10.5 and ferritin is 7. - I recommend she have 1 infusion of Feraheme. - She will return in 4 months with repeat labs prior.  3.  Diffuse large B-cell lymphoma: - Patient was diagnosed in 1999; status post CHOP therapy x7 cycles followed by Rituxan x1 cycle. - Posttreatment CT scan showed complete response. - Labs on 05/08/2018 showed LDH was 151. -No adenopathy on assessment. - She will return in 4 months with repeat labs.      Orders placed this encounter:  Orders Placed This Encounter  Procedures  . Lactate dehydrogenase  . CBC with Differential/Platelet  . Comprehensive metabolic panel  . Ferritin  . Iron and TIBC  . Vitamin B12  . VITAMIN D 25 Hydroxy (Vit-D Deficiency, Fractures)  . Calzada FNP-C [place] [phone]

## 2018-05-18 ENCOUNTER — Encounter (HOSPITAL_COMMUNITY): Payer: Self-pay

## 2018-05-18 ENCOUNTER — Other Ambulatory Visit: Payer: Self-pay

## 2018-05-18 ENCOUNTER — Inpatient Hospital Stay (HOSPITAL_COMMUNITY): Payer: Medicare Other

## 2018-05-18 VITALS — BP 139/63 | HR 77 | Temp 98.2°F | Resp 18 | Wt 170.6 lb

## 2018-05-18 DIAGNOSIS — H8109 Meniere's disease, unspecified ear: Secondary | ICD-10-CM | POA: Diagnosis not present

## 2018-05-18 DIAGNOSIS — Z9221 Personal history of antineoplastic chemotherapy: Secondary | ICD-10-CM | POA: Diagnosis not present

## 2018-05-18 DIAGNOSIS — E7800 Pure hypercholesterolemia, unspecified: Secondary | ICD-10-CM | POA: Diagnosis not present

## 2018-05-18 DIAGNOSIS — D509 Iron deficiency anemia, unspecified: Secondary | ICD-10-CM | POA: Diagnosis not present

## 2018-05-18 DIAGNOSIS — C946 Myelodysplastic disease, not classified: Secondary | ICD-10-CM | POA: Diagnosis not present

## 2018-05-18 DIAGNOSIS — I739 Peripheral vascular disease, unspecified: Secondary | ICD-10-CM | POA: Diagnosis not present

## 2018-05-18 DIAGNOSIS — R5383 Other fatigue: Secondary | ICD-10-CM | POA: Diagnosis not present

## 2018-05-18 DIAGNOSIS — Z79899 Other long term (current) drug therapy: Secondary | ICD-10-CM | POA: Diagnosis not present

## 2018-05-18 DIAGNOSIS — F329 Major depressive disorder, single episode, unspecified: Secondary | ICD-10-CM | POA: Diagnosis not present

## 2018-05-18 DIAGNOSIS — Z7982 Long term (current) use of aspirin: Secondary | ICD-10-CM | POA: Diagnosis not present

## 2018-05-18 DIAGNOSIS — E669 Obesity, unspecified: Secondary | ICD-10-CM | POA: Diagnosis not present

## 2018-05-18 DIAGNOSIS — Z8572 Personal history of non-Hodgkin lymphomas: Secondary | ICD-10-CM | POA: Diagnosis not present

## 2018-05-18 DIAGNOSIS — Z87891 Personal history of nicotine dependence: Secondary | ICD-10-CM | POA: Diagnosis not present

## 2018-05-18 DIAGNOSIS — E876 Hypokalemia: Secondary | ICD-10-CM | POA: Diagnosis not present

## 2018-05-18 DIAGNOSIS — I10 Essential (primary) hypertension: Secondary | ICD-10-CM | POA: Diagnosis not present

## 2018-05-18 MED ORDER — SODIUM CHLORIDE 0.9 % IV SOLN
510.0000 mg | Freq: Once | INTRAVENOUS | Status: AC
  Administered 2018-05-18: 510 mg via INTRAVENOUS
  Filled 2018-05-18: qty 510

## 2018-05-18 MED ORDER — SODIUM CHLORIDE 0.9 % IV SOLN
INTRAVENOUS | Status: DC
  Administered 2018-05-18: 09:00:00 via INTRAVENOUS

## 2018-05-18 NOTE — Patient Instructions (Signed)
[name] [facility]  Discharge Instructions:  Leslie Williamson given today  Follow up as scheduled. _______________________________________________________________  Thank you for choosing [facility] to provide your oncology and hematology care.  To afford each patient quality time with our providers, please arrive at least 15 minutes before your scheduled appointment.  You need to re-schedule your appointment if you arrive 10 or more minutes late.  We strive to give you quality time with our providers, and arriving late affects you and other patients whose appointments are after yours.  Also, if you no show three or more times for appointments you may be dismissed from the clinic.  Again, thank you for choosing [facility] hope is that these requests will allow you access to exceptional care and in a timely manner. _______________________________________________________________  If you have questions after your visit, please contact our office at [phone] between the hours of 8:30 a.m. and 5:00 p.m. Voicemails left after 4:30 p.m. will not be returned until the following business day. _______________________________________________________________  For prescription refill requests, have your pharmacy contact our office. _______________________________________________________________  Recommendations made by the consultant and any test results will be sent to your referring physician. _______________________________________________________________

## 2018-05-18 NOTE — Progress Notes (Signed)
Feraheme given per orders. Patient tolerated it well without problems. Vitals stable and discharged home from clinic ambulatory. Follow up as scheduled.  

## 2018-08-16 ENCOUNTER — Other Ambulatory Visit: Payer: Self-pay

## 2018-08-16 ENCOUNTER — Other Ambulatory Visit: Payer: Self-pay | Admitting: Internal Medicine

## 2018-08-16 DIAGNOSIS — Z20822 Contact with and (suspected) exposure to covid-19: Secondary | ICD-10-CM

## 2018-08-16 DIAGNOSIS — R6889 Other general symptoms and signs: Secondary | ICD-10-CM | POA: Diagnosis not present

## 2018-08-17 DIAGNOSIS — H26499 Other secondary cataract, unspecified eye: Secondary | ICD-10-CM | POA: Diagnosis not present

## 2018-08-20 LAB — NOVEL CORONAVIRUS, NAA: SARS-CoV-2, NAA: NOT DETECTED

## 2018-09-11 ENCOUNTER — Other Ambulatory Visit: Payer: Self-pay

## 2018-09-11 ENCOUNTER — Inpatient Hospital Stay (HOSPITAL_COMMUNITY): Payer: Medicare Other | Attending: Hematology

## 2018-09-11 DIAGNOSIS — I739 Peripheral vascular disease, unspecified: Secondary | ICD-10-CM | POA: Diagnosis not present

## 2018-09-11 DIAGNOSIS — Z79899 Other long term (current) drug therapy: Secondary | ICD-10-CM | POA: Insufficient documentation

## 2018-09-11 DIAGNOSIS — R5383 Other fatigue: Secondary | ICD-10-CM | POA: Diagnosis not present

## 2018-09-11 DIAGNOSIS — R42 Dizziness and giddiness: Secondary | ICD-10-CM | POA: Insufficient documentation

## 2018-09-11 DIAGNOSIS — K5900 Constipation, unspecified: Secondary | ICD-10-CM | POA: Insufficient documentation

## 2018-09-11 DIAGNOSIS — Z9081 Acquired absence of spleen: Secondary | ICD-10-CM | POA: Diagnosis not present

## 2018-09-11 DIAGNOSIS — Z87891 Personal history of nicotine dependence: Secondary | ICD-10-CM | POA: Diagnosis not present

## 2018-09-11 DIAGNOSIS — E669 Obesity, unspecified: Secondary | ICD-10-CM | POA: Insufficient documentation

## 2018-09-11 DIAGNOSIS — I10 Essential (primary) hypertension: Secondary | ICD-10-CM | POA: Diagnosis not present

## 2018-09-11 DIAGNOSIS — D509 Iron deficiency anemia, unspecified: Secondary | ICD-10-CM | POA: Insufficient documentation

## 2018-09-11 DIAGNOSIS — Z8572 Personal history of non-Hodgkin lymphomas: Secondary | ICD-10-CM | POA: Diagnosis not present

## 2018-09-11 DIAGNOSIS — Z7982 Long term (current) use of aspirin: Secondary | ICD-10-CM | POA: Insufficient documentation

## 2018-09-11 DIAGNOSIS — F418 Other specified anxiety disorders: Secondary | ICD-10-CM | POA: Diagnosis not present

## 2018-09-11 DIAGNOSIS — Z9221 Personal history of antineoplastic chemotherapy: Secondary | ICD-10-CM | POA: Insufficient documentation

## 2018-09-11 DIAGNOSIS — G4700 Insomnia, unspecified: Secondary | ICD-10-CM | POA: Diagnosis not present

## 2018-09-11 DIAGNOSIS — E7800 Pure hypercholesterolemia, unspecified: Secondary | ICD-10-CM | POA: Diagnosis not present

## 2018-09-11 DIAGNOSIS — C946 Myelodysplastic disease, not classified: Secondary | ICD-10-CM | POA: Diagnosis not present

## 2018-09-11 DIAGNOSIS — D471 Chronic myeloproliferative disease: Secondary | ICD-10-CM

## 2018-09-11 DIAGNOSIS — C8330 Diffuse large B-cell lymphoma, unspecified site: Secondary | ICD-10-CM

## 2018-09-11 LAB — COMPREHENSIVE METABOLIC PANEL
ALT: 11 U/L (ref 0–44)
AST: 14 U/L — ABNORMAL LOW (ref 15–41)
Albumin: 3.4 g/dL — ABNORMAL LOW (ref 3.5–5.0)
Alkaline Phosphatase: 67 U/L (ref 38–126)
Anion gap: 6 (ref 5–15)
BUN: 16 mg/dL (ref 8–23)
CO2: 24 mmol/L (ref 22–32)
Calcium: 9 mg/dL (ref 8.9–10.3)
Chloride: 111 mmol/L (ref 98–111)
Creatinine, Ser: 1.14 mg/dL — ABNORMAL HIGH (ref 0.44–1.00)
GFR calc Af Amer: 51 mL/min — ABNORMAL LOW (ref >60)
GFR calc non Af Amer: 44 mL/min — ABNORMAL LOW (ref >60)
Glucose, Bld: 105 mg/dL — ABNORMAL HIGH (ref 70–99)
Potassium: 4.3 mmol/L (ref 3.5–5.1)
Sodium: 141 mmol/L (ref 135–145)
Total Bilirubin: 0.7 mg/dL (ref 0.3–1.2)
Total Protein: 6.7 g/dL (ref 6.5–8.1)

## 2018-09-11 LAB — IRON AND TIBC
Iron: 76 ug/dL (ref 28–170)
Saturation Ratios: 21 % (ref 10.4–31.8)
TIBC: 360 ug/dL (ref 250–450)
UIBC: 284 ug/dL

## 2018-09-11 LAB — CBC WITH DIFFERENTIAL/PLATELET
Abs Immature Granulocytes: 0.02 10*3/uL (ref 0.00–0.07)
Basophils Absolute: 0 10*3/uL (ref 0.0–0.1)
Basophils Relative: 0 %
Eosinophils Absolute: 0.1 10*3/uL (ref 0.0–0.5)
Eosinophils Relative: 1 %
HCT: 36 % (ref 36.0–46.0)
Hemoglobin: 11.8 g/dL — ABNORMAL LOW (ref 12.0–15.0)
Immature Granulocytes: 0 %
Lymphocytes Relative: 37 %
Lymphs Abs: 2.6 10*3/uL (ref 0.7–4.0)
MCH: 36.2 pg — ABNORMAL HIGH (ref 26.0–34.0)
MCHC: 32.8 g/dL (ref 30.0–36.0)
MCV: 110.4 fL — ABNORMAL HIGH (ref 80.0–100.0)
Monocytes Absolute: 0.8 10*3/uL (ref 0.1–1.0)
Monocytes Relative: 11 %
Neutro Abs: 3.5 10*3/uL (ref 1.7–7.7)
Neutrophils Relative %: 51 %
Platelets: 438 10*3/uL — ABNORMAL HIGH (ref 150–400)
RBC: 3.26 MIL/uL — ABNORMAL LOW (ref 3.87–5.11)
RDW: 16.4 % — ABNORMAL HIGH (ref 11.5–15.5)
WBC: 7.1 10*3/uL (ref 4.0–10.5)
nRBC: 0 % (ref 0.0–0.2)

## 2018-09-11 LAB — FOLATE: Folate: 15.5 ng/mL (ref >5.9)

## 2018-09-11 LAB — FERRITIN: Ferritin: 8 ng/mL — ABNORMAL LOW (ref 11–307)

## 2018-09-11 LAB — LACTATE DEHYDROGENASE: LDH: 134 U/L (ref 98–192)

## 2018-09-11 LAB — VITAMIN B12: Vitamin B-12: 753 pg/mL (ref 180–914)

## 2018-09-12 LAB — VITAMIN D 25 HYDROXY (VIT D DEFICIENCY, FRACTURES): Vit D, 25-Hydroxy: 30.6 ng/mL (ref 30.0–100.0)

## 2018-09-18 ENCOUNTER — Inpatient Hospital Stay (HOSPITAL_BASED_OUTPATIENT_CLINIC_OR_DEPARTMENT_OTHER): Payer: Medicare Other | Admitting: Nurse Practitioner

## 2018-09-18 ENCOUNTER — Other Ambulatory Visit: Payer: Self-pay

## 2018-09-18 VITALS — BP 121/68 | HR 101 | Temp 97.9°F | Resp 14 | Wt 152.5 lb

## 2018-09-18 DIAGNOSIS — D509 Iron deficiency anemia, unspecified: Secondary | ICD-10-CM

## 2018-09-18 DIAGNOSIS — G479 Sleep disorder, unspecified: Secondary | ICD-10-CM

## 2018-09-18 DIAGNOSIS — E669 Obesity, unspecified: Secondary | ICD-10-CM | POA: Diagnosis not present

## 2018-09-18 DIAGNOSIS — Z9081 Acquired absence of spleen: Secondary | ICD-10-CM

## 2018-09-18 DIAGNOSIS — I739 Peripheral vascular disease, unspecified: Secondary | ICD-10-CM

## 2018-09-18 DIAGNOSIS — G4700 Insomnia, unspecified: Secondary | ICD-10-CM

## 2018-09-18 DIAGNOSIS — Z9221 Personal history of antineoplastic chemotherapy: Secondary | ICD-10-CM | POA: Diagnosis not present

## 2018-09-18 DIAGNOSIS — Z87891 Personal history of nicotine dependence: Secondary | ICD-10-CM

## 2018-09-18 DIAGNOSIS — F418 Other specified anxiety disorders: Secondary | ICD-10-CM

## 2018-09-18 DIAGNOSIS — E7800 Pure hypercholesterolemia, unspecified: Secondary | ICD-10-CM | POA: Diagnosis not present

## 2018-09-18 DIAGNOSIS — R42 Dizziness and giddiness: Secondary | ICD-10-CM | POA: Diagnosis not present

## 2018-09-18 DIAGNOSIS — R5383 Other fatigue: Secondary | ICD-10-CM

## 2018-09-18 DIAGNOSIS — K5900 Constipation, unspecified: Secondary | ICD-10-CM | POA: Diagnosis not present

## 2018-09-18 DIAGNOSIS — Z79899 Other long term (current) drug therapy: Secondary | ICD-10-CM | POA: Diagnosis not present

## 2018-09-18 DIAGNOSIS — Z8572 Personal history of non-Hodgkin lymphomas: Secondary | ICD-10-CM | POA: Diagnosis not present

## 2018-09-18 DIAGNOSIS — Z7982 Long term (current) use of aspirin: Secondary | ICD-10-CM

## 2018-09-18 DIAGNOSIS — C946 Myelodysplastic disease, not classified: Secondary | ICD-10-CM | POA: Diagnosis not present

## 2018-09-18 DIAGNOSIS — I10 Essential (primary) hypertension: Secondary | ICD-10-CM

## 2018-09-18 MED ORDER — TEMAZEPAM 15 MG PO CAPS: 15.0000 mg | ORAL_CAPSULE | Freq: Every evening | ORAL | 0 refills | Status: DC | PRN

## 2018-09-18 NOTE — Progress Notes (Signed)
[place], [place] [address]   CLINIC:  Medical Oncology/Hematology  PCP:  Rosita Fire, MD [place] [address] [phone]   REASON FOR VISIT: Follow-up for iron deficiency anemia and Jak 2+ myeloproliferative disorder  CURRENT THERAPY: Hydrea 500 mg daily  BRIEF ONCOLOGIC HISTORY:  Oncology History  NHL (non-Hodgkin's lymphoma) ([place])  07/19/1995 Pathology Results   Spleen biopsy with resection- low-grade B-cell lymphoma, splenic marginal zone type   02/20/1998 Imaging   CT CAP-marked and extensive cervical adenopathy   02/25/1998 Pathology Results   Right cervical lymph node demonstrating large B-cell lymphoma   03/05/1998 Bone Marrow Biopsy   No evidence of bone marrow involvement   03/11/1998 - 07/18/1998 Chemotherapy   CHOP x 7 cycles   05/09/1998 Imaging   CT CAP and neck- slight interval decrease in size of para-aortic adenopathy with interval decrease in right inguinal and bi-iliac adenopathy.  Interval decrease in size and number of enlarged cervical nodes   07/10/1998 Imaging   Ct CAP and neck- unremarkable CT of chest. No enlarged retroperitoneal adenopathy withthe previously noted retroperitoneal nodes currently smaller to stable in size.  Cervical adenopathy is stable to slightly smaller in size.   08/18/1998 - 09/08/1998 Chemotherapy   Rituxan Day 1, 8, 15, 22 x 1 cycle   01/08/1999 Remission   CT CAP and neck demonstrates no adenopathy or disease   01/08/1999 Imaging   CT CAP and neck- Stable CT of chest. Stable CT abd/pelvis without adenopathy.  Negative CT of neck     INTERVAL HISTORY:  Leslie Williamson 85 y.o. female returns for routine follow-up for iron deficiency anemia and Jak 2+ myeloproliferative disorder.  Patient reports she has been feeling fine since her last visit.  She does report occasional constipation with tiny bit of blood in her stool.  She also reports she is having a lot of  problems sleeping at night she cannot go to sleep some nights and some nights she wakes up several times.  She is requesting something to help her sleep. Denies any nausea, vomiting, or diarrhea. Denies any new pains. Had not noticed any recent bleeding such as epistaxis, hematuria or hematochezia. Denies recent chest pain on exertion, shortness of breath on minimal exertion, pre-syncopal episodes, or palpitations. Denies any numbness or tingling in hands or feet. Denies any recent fevers, infections, or recent hospitalizations. Patient reports appetite at 100% and energy level at 25%.  She is eating well maintaining her weight at this time.   REVIEW OF SYSTEMS:  Review of Systems  Constitutional: Positive for fatigue.  Gastrointestinal: Positive for constipation.  Psychiatric/Behavioral: Positive for sleep disturbance.  All other systems reviewed and are negative.    PAST MEDICAL/SURGICAL HISTORY:  Past Medical History:  Diagnosis Date  . Anxiety   . Complication of anesthesia    difficult to wake up  . Depression   . Hypercholesteremia   . Hypertension   . Hypokalemia   . Iron deficiency anemia   . Left hip pain   . NHL (non-Hodgkin's lymphoma) ([place]) 12/17/2010  . Obesity   . Primary thrombocytosis ([place]) 01/11/2006   Secondary to splenectomy    . PVD (peripheral vascular disease) ([place])   . S/P splenectomy 01/01/2014  . 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 Vertigo, labyrinthine    Past Surgical History:  Procedure Laterality Date  . CATARACT  EXTRACTION W/PHACO Left 11/03/2015   Procedure: CATARACT EXTRACTION PHACO AND INTRAOCULAR LENS PLACEMENT (IOC);  Surgeon: Tonny Branch, MD;  Location: [facility];  Service: Ophthalmology;  Laterality: Left;  CDE: 7.74  . CATARACT EXTRACTION W/PHACO Right 11/20/2015   Procedure: CATARACT EXTRACTION PHACO AND INTRAOCULAR LENS PLACEMENT ; CDE:  8.30;  Surgeon: Tonny Branch,  MD;  Location: [facility];  Service: Ophthalmology;  Laterality: Right;  . PORT-A-CATH REMOVAL       SOCIAL HISTORY:  Social History   Socioeconomic History  . Marital status: Widowed    Spouse name: Not on file  . Number of children: Not on file  . Years of education: Not on file  . Highest education level: Not on file  Occupational History  . Not on file  Social Needs  . Financial resource strain: Not on file  . Food insecurity    Worry: Not on file    Inability: Not on file  . Transportation needs    Medical: Not on file    Non-medical: Not on file  Tobacco Use  . Smoking status: Former Research scientist (life sciences)  . Smokeless tobacco: Never Used  Substance and Sexual Activity  . Alcohol use: No  . Drug use: No  . Sexual activity: Never  Lifestyle  . Physical activity    Days per week: Not on file    Minutes per session: Not on file  . Stress: Not on file  Relationships  . Social Herbalist on phone: Not on file    Gets together: Not on file    Attends religious service: Not on file    Active member of club or organization: Not on file    Attends meetings of clubs or organizations: Not on file    Relationship status: Not on file  . Intimate partner violence    Fear of current or ex partner: Not on file    Emotionally abused: Not on file    Physically abused: Not on file    Forced sexual activity: Not on file  Other Topics Concern  . Not on file  Social History Narrative  . Not on file    FAMILY HISTORY:  Family History  Problem Relation Age of Onset  . Diabetes Mother     CURRENT MEDICATIONS:  Outpatient Encounter Medications as of 09/18/2018  Medication Sig Note  . amLODipine (NORVASC) 2.5 MG tablet Take 2.5 mg by mouth daily. 01/10/2015: Received from: External Pharmacy Received Sig:   . aspirin 81 MG tablet Take 81 mg by mouth daily.   Marland Kitchen atorvastatin (LIPITOR) 40 MG tablet Take 40 mg by mouth daily. 01/10/2015: Received from: External Pharmacy Received Sig:    . cilostazol (PLETAL) 100 MG tablet Take 100 mg by mouth 2 (two) times daily.   . diclofenac (CATAFLAM) 50 MG tablet Take 1 tablet (50 mg total) by mouth 2 (two) times daily.   . hydrochlorothiazide (HYDRODIURIL) 12.5 MG tablet    . hydroxyurea (HYDREA) 500 MG capsule May take with food to minimize GI side effects.   Marland Kitchen losartan (COZAAR) 100 MG tablet    . losartan-hydrochlorothiazide (HYZAAR) 100-12.5 MG per tablet Take 1 tablet by mouth daily. Reported on 06/30/2015   . potassium chloride SA (K-DUR,KLOR-CON) 20 MEQ tablet TAKE ONE TABLET BY MOUTH TWICE DAILY.   Marland Kitchen ALPRAZolam (XANAX) 0.5 MG tablet Take 0.5 mg by mouth 2 (two) times daily as needed.  12/16/2017: Patient hasn't taken this in 6 months  . ibuprofen (ADVIL,MOTRIN) 800  MG tablet Take 800 mg by mouth every 8 (eight) hours as needed for moderate pain.  03/11/2015: Received from: External Pharmacy  . meclizine (ANTIVERT) 25 MG tablet Take 1 tablet (25 mg total) by mouth 3 (three) times daily as needed for dizziness. (Patient not taking: Reported on 05/15/2018)   . temazepam (RESTORIL) 15 MG capsule Take 1 capsule (15 mg total) by mouth at bedtime as needed for sleep.   . vitamin B-12 (CYANOCOBALAMIN) 1000 MCG tablet Take 1,000 mcg by mouth daily.    No facility-administered encounter medications on file as of 09/18/2018.     ALLERGIES:  Allergies  Allergen Reactions  . Penicillins Rash     PHYSICAL EXAM:  ECOG Performance status: 1  Vitals:   09/18/18 1310  BP: 121/68  Pulse: (!) 101  Resp: 14  Temp: 97.9 F (36.6 C)  SpO2: 97%   Filed Weights   09/18/18 1310  Weight: 152 lb 8 oz (69.2 kg)    Physical Exam Constitutional:      Appearance: Normal appearance. She is normal weight.  Cardiovascular:     Rate and Rhythm: Normal rate and regular rhythm.     Heart sounds: Normal heart sounds.  Pulmonary:     Effort: Pulmonary effort is normal.     Breath sounds: Normal breath sounds.  Abdominal:     General: Bowel sounds  are normal.     Palpations: Abdomen is soft.  Musculoskeletal: Normal range of motion.  Skin:    General: Skin is warm and dry.  Neurological:     Mental Status: She is alert and oriented to person, place, and time. Mental status is at baseline.  Psychiatric:        Mood and Affect: Mood normal.        Behavior: Behavior normal.        Thought Content: Thought content normal.        Judgment: Judgment normal.      LABORATORY DATA:  I have reviewed the labs as listed.  CBC    Component Value Date/Time   WBC 7.1 09/11/2018 1426   RBC 3.26 (L) 09/11/2018 1426   HGB 11.8 (L) 09/11/2018 1426   HCT 36.0 09/11/2018 1426   PLT 438 (H) 09/11/2018 1426   MCV 110.4 (H) 09/11/2018 1426   MCH 36.2 (H) 09/11/2018 1426   MCHC 32.8 09/11/2018 1426   RDW 16.4 (H) 09/11/2018 1426   LYMPHSABS 2.6 09/11/2018 1426   MONOABS 0.8 09/11/2018 1426   EOSABS 0.1 09/11/2018 1426   BASOSABS 0.0 09/11/2018 1426   CMP Latest Ref Rng & Units 09/11/2018 05/08/2018 12/09/2017  Glucose 70 - 99 mg/dL 105(H) 127(H) 125(H)  BUN 8 - 23 mg/dL 16 13 20   Creatinine 0.44 - 1.00 mg/dL 1.14(H) 0.84 0.98  Sodium 135 - 145 mmol/L 141 141 139  Potassium 3.5 - 5.1 mmol/L 4.3 3.6 3.5  Chloride 98 - 111 mmol/L 111 111 109  CO2 22 - 32 mmol/L 24 23 23   Calcium 8.9 - 10.3 mg/dL 9.0 9.1 9.0  Total Protein 6.5 - 8.1 g/dL 6.7 6.9 7.1  Total Bilirubin 0.3 - 1.2 mg/dL 0.7 0.5 0.5  Alkaline Phos 38 - 126 U/L 67 62 67  AST 15 - 41 U/L 14(L) 17 14(L)  ALT 0 - 44 U/L 11 12 12      I personally performed a face-to-face visit.  All questions were answered to patient's stated satisfaction. Encouraged patient to call with any new concerns or questions  before his next visit to the cancer center and we can certain see him sooner, if needed.     ASSESSMENT & PLAN:   Iron deficiency anemia 1.  Myeloproliferative disorder/thrombocytosis: - Thrombocytosis and is felt secondary to previous splenectomy and Jak 2 mutation. - Labs in  2008 showed platelet count ranged from 400,000-460,000. -Labs on 09/11/2018 showed her platelet count 438, hemoglobin 11.8, WBC 7.1, ferritin 8, percent saturation 21. - She is on Hydrea 500 mg daily.  She is tolerating the medication well with no side effects. -She will continue the Hydrea and we will follow-up in 4 months with repeat labs.  2.  Iron deficiency anemia: -She tried oral iron and could not tolerate it due to constipation. - She last had 1 infusion of Feraheme on 07/23/2016.  - Her labs on 09/11/2018 hemoglobin is 11.8, ferritin is 8, percent saturation 21. -She is still feeling a little fatigued during the day.  She is also still having some constipation. - I recommend she have 2 infusions of IV Feraheme. - She will return in 4 months with repeat labs prior.  3.  Diffuse large B-cell lymphoma: - Patient was diagnosed in 1999; status post CHOP therapy x7 cycles followed by Rituxan x1 cycle. - Posttreatment CT scan showed complete response. - Labs on 05/08/2018 showed LDH was 151. -No adenopathy on assessment. - She will return in 4 months with repeat labs.      Orders placed this encounter:  Orders Placed This Encounter  Procedures  . Lactate dehydrogenase  . CBC with Differential/Platelet  . Comprehensive metabolic panel  . Ferritin  . Iron and TIBC  . Vitamin B12  . VITAMIN D 25 Hydroxy (Vit-D Deficiency, Fractures)  . Folate      Leslie Finders, FNP-C [place] [phone]

## 2018-09-18 NOTE — Patient Instructions (Signed)
[facility] at [facility] Discharge Instructions Follow up in 4 months with labs    Thank you for choosing [facility] at [facility] to provide your oncology and hematology care.  To afford each patient quality time with our provider, please arrive at least 15 minutes before your scheduled appointment time.   If you have a lab appointment with the Cancer Center please come in thru the  Main Entrance and check in at the main information desk  You need to re-schedule your appointment should you arrive 10 or more minutes late.  We strive to give you quality time with our providers, and arriving late affects you and other patients whose appointments are after yours.  Also, if you no show three or more times for appointments you may be dismissed from the clinic at the providers discretion.     Again, thank you for choosing [facility].  Our hope is that these requests will decrease the amount of time that you wait before being seen by our physicians.       _____________________________________________________________  Should you have questions after your visit to [facility], please contact our office at [phone] between the hours of 8:00 a.m. and 4:30 p.m.  Voicemails left after 4:00 p.m. will not be returned until the following business day.  For prescription refill requests, have your pharmacy contact our office and allow 72 hours.    Cancer Center Support Programs:   > Cancer Support Group  2nd Tuesday of the month 1pm-2pm, Journey Room    

## 2018-09-18 NOTE — Assessment & Plan Note (Addendum)
1.  Myeloproliferative disorder/thrombocytosis: - Thrombocytosis and is felt secondary to previous splenectomy and Jak 2 mutation. - Labs in 2008 showed platelet count ranged from 400,000-460,000. -Labs on 09/11/2018 showed her platelet count 438, hemoglobin 11.8, WBC 7.1, ferritin 8, percent saturation 21. - She is on Hydrea 500 mg daily.  She is tolerating the medication well with no side effects. -She will continue the Hydrea and we will follow-up in 4 months with repeat labs.  2.  Iron deficiency anemia: -She tried oral iron and could not tolerate it due to constipation. - She last had 1 infusion of Feraheme on 07/23/2016.  - Her labs on 09/11/2018 hemoglobin is 11.8, ferritin is 8, percent saturation 21. -She is still feeling a little fatigued during the day.  She is also still having some constipation. - I recommend she have 2 infusions of IV Feraheme. - She will return in 4 months with repeat labs prior.  3.  Diffuse large B-cell lymphoma: - Patient was diagnosed in 1999; status post CHOP therapy x7 cycles followed by Rituxan x1 cycle. - Posttreatment CT scan showed complete response. - Labs on 05/08/2018 showed LDH was 151. -No adenopathy on assessment. - She will return in 4 months with repeat labs.

## 2018-09-20 ENCOUNTER — Other Ambulatory Visit: Payer: Self-pay

## 2018-09-20 ENCOUNTER — Encounter (HOSPITAL_COMMUNITY): Payer: Self-pay

## 2018-09-20 ENCOUNTER — Inpatient Hospital Stay (HOSPITAL_COMMUNITY): Payer: Medicare Other

## 2018-09-20 VITALS — BP 128/60 | HR 99 | Temp 97.6°F | Resp 18

## 2018-09-20 DIAGNOSIS — E7800 Pure hypercholesterolemia, unspecified: Secondary | ICD-10-CM | POA: Diagnosis not present

## 2018-09-20 DIAGNOSIS — D509 Iron deficiency anemia, unspecified: Secondary | ICD-10-CM

## 2018-09-20 DIAGNOSIS — Z87891 Personal history of nicotine dependence: Secondary | ICD-10-CM | POA: Diagnosis not present

## 2018-09-20 DIAGNOSIS — Z7982 Long term (current) use of aspirin: Secondary | ICD-10-CM | POA: Diagnosis not present

## 2018-09-20 DIAGNOSIS — R5383 Other fatigue: Secondary | ICD-10-CM | POA: Diagnosis not present

## 2018-09-20 DIAGNOSIS — Z9221 Personal history of antineoplastic chemotherapy: Secondary | ICD-10-CM | POA: Diagnosis not present

## 2018-09-20 DIAGNOSIS — R42 Dizziness and giddiness: Secondary | ICD-10-CM | POA: Diagnosis not present

## 2018-09-20 DIAGNOSIS — Z79899 Other long term (current) drug therapy: Secondary | ICD-10-CM | POA: Diagnosis not present

## 2018-09-20 DIAGNOSIS — F418 Other specified anxiety disorders: Secondary | ICD-10-CM | POA: Diagnosis not present

## 2018-09-20 DIAGNOSIS — I739 Peripheral vascular disease, unspecified: Secondary | ICD-10-CM | POA: Diagnosis not present

## 2018-09-20 DIAGNOSIS — Z8572 Personal history of non-Hodgkin lymphomas: Secondary | ICD-10-CM | POA: Diagnosis not present

## 2018-09-20 DIAGNOSIS — E669 Obesity, unspecified: Secondary | ICD-10-CM | POA: Diagnosis not present

## 2018-09-20 DIAGNOSIS — C946 Myelodysplastic disease, not classified: Secondary | ICD-10-CM | POA: Diagnosis not present

## 2018-09-20 DIAGNOSIS — I10 Essential (primary) hypertension: Secondary | ICD-10-CM | POA: Diagnosis not present

## 2018-09-20 DIAGNOSIS — K5900 Constipation, unspecified: Secondary | ICD-10-CM | POA: Diagnosis not present

## 2018-09-20 DIAGNOSIS — G4700 Insomnia, unspecified: Secondary | ICD-10-CM | POA: Diagnosis not present

## 2018-09-20 MED ORDER — SODIUM CHLORIDE 0.9 % IV SOLN
INTRAVENOUS | Status: DC
  Administered 2018-09-20: 14:00:00 via INTRAVENOUS

## 2018-09-20 MED ORDER — SODIUM CHLORIDE 0.9 % IV SOLN
510.0000 mg | Freq: Once | INTRAVENOUS | Status: AC
  Administered 2018-09-20: 510 mg via INTRAVENOUS
  Filled 2018-09-20: qty 510

## 2018-09-20 NOTE — Patient Instructions (Signed)
[facility] at [facility] Discharge Instructions  Received Feraheme infusion today. Follow-up as scheduled. Call clinic for any questions or concerns   Thank you for choosing [facility] at [facility] to provide your oncology and hematology care.  To afford each patient quality time with our provider, please arrive at least 15 minutes before your scheduled appointment time.   If you have a lab appointment with the Cancer Center please come in thru the  Main Entrance and check in at the main information desk  You need to re-schedule your appointment should you arrive 10 or more minutes late.  We strive to give you quality time with our providers, and arriving late affects you and other patients whose appointments are after yours.  Also, if you no show three or more times for appointments you may be dismissed from the clinic at the providers discretion.     Again, thank you for choosing [facility].  Our hope is that these requests will decrease the amount of time that you wait before being seen by our physicians.       _____________________________________________________________  Should you have questions after your visit to [facility], please contact our office at [phone] between the hours of 8:00 a.m. and 4:30 p.m.  Voicemails left after 4:00 p.m. will not be returned until the following business day.  For prescription refill requests, have your pharmacy contact our office and allow 72 hours.    Cancer Center Support Programs:   > Cancer Support Group  2nd Tuesday of the month 1pm-2pm, Journey Room   

## 2018-09-20 NOTE — Progress Notes (Signed)
Leslie Williamson tolerated Feraheme infusion well without complaints or incident. Peripheral IV site with positive blood return noted prior to and after infusion. VSS upon discharge. Pt discharged self ambulatory in satisfactory condition

## 2018-09-27 ENCOUNTER — Encounter (HOSPITAL_COMMUNITY): Payer: Self-pay

## 2018-09-27 ENCOUNTER — Inpatient Hospital Stay (HOSPITAL_COMMUNITY): Payer: Medicare Other | Attending: Hematology

## 2018-09-27 ENCOUNTER — Other Ambulatory Visit: Payer: Self-pay

## 2018-09-27 VITALS — BP 119/58 | HR 98 | Temp 96.8°F | Resp 18

## 2018-09-27 DIAGNOSIS — Z79899 Other long term (current) drug therapy: Secondary | ICD-10-CM | POA: Diagnosis not present

## 2018-09-27 DIAGNOSIS — D509 Iron deficiency anemia, unspecified: Secondary | ICD-10-CM | POA: Diagnosis not present

## 2018-09-27 MED ORDER — SODIUM CHLORIDE 0.9 % IV SOLN
510.0000 mg | Freq: Once | INTRAVENOUS | Status: AC
  Administered 2018-09-27: 510 mg via INTRAVENOUS
  Filled 2018-09-27: qty 510

## 2018-09-27 MED ORDER — SODIUM CHLORIDE 0.9 % IV SOLN
INTRAVENOUS | Status: DC
  Administered 2018-09-27: 15:00:00 via INTRAVENOUS

## 2018-09-27 NOTE — Progress Notes (Signed)
Iron infusion given today per orders.  Patient tolerated it well without problems. Vitals stable and discharged home from clinic ambulatory. Follow up as scheduled.

## 2018-09-27 NOTE — Patient Instructions (Signed)
[facility] at [facility]  Discharge Instructions:   _______________________________________________________________  Thank you for choosing [facility] at [facility] to provide your oncology and hematology care.  To afford each patient quality time with our providers, please arrive at least 15 minutes before your scheduled appointment.  You need to re-schedule your appointment if you arrive 10 or more minutes late.  We strive to give you quality time with our providers, and arriving late affects you and other patients whose appointments are after yours.  Also, if you no show three or more times for appointments you may be dismissed from the clinic.  Again, thank you for choosing [facility] at [facility]. Our hope is that these requests will allow you access to exceptional care and in a timely manner. _______________________________________________________________  If you have questions after your visit, please contact our office at [phone] between the hours of 8:30 a.m. and 5:00 p.m. Voicemails left after 4:30 p.m. will not be returned until the following business day. _______________________________________________________________  For prescription refill requests, have your pharmacy contact our office. _______________________________________________________________  Recommendations made by the consultant and any test results will be sent to your referring physician. _______________________________________________________________ 

## 2018-09-29 ENCOUNTER — Encounter: Payer: Self-pay | Admitting: Podiatry

## 2018-09-29 ENCOUNTER — Ambulatory Visit: Payer: Medicare Other | Admitting: Podiatry

## 2018-09-29 ENCOUNTER — Other Ambulatory Visit: Payer: Self-pay

## 2018-09-29 VITALS — BP 155/86 | Temp 97.9°F

## 2018-09-29 DIAGNOSIS — B351 Tinea unguium: Secondary | ICD-10-CM | POA: Diagnosis not present

## 2018-09-29 DIAGNOSIS — M79609 Pain in unspecified limb: Secondary | ICD-10-CM

## 2018-09-29 NOTE — Progress Notes (Signed)
Subjective:  Patient ID: Leslie Williamson, female    DOB: 07-20-1931,  MRN: [id number]  Chief Complaint  Patient presents with  . Nail Problem    pt has toenail discoloration between right and left big toenails, which has been going on for about a year now, pt states that it does not hurt, but has tried trimming it, which has not helped   85 y.o. female presents with the above complaint.  Thick nails she cannot care for herself. Reports painfully elongated nails to both feet.  Review of Systems: Negative except as noted in the HPI. Denies N/V/F/Ch.  Past Medical History:  Diagnosis Date  . Anxiety   . Complication of anesthesia    difficult to wake up  . Depression   . Hypercholesteremia   . Hypertension   . Hypokalemia   . Iron deficiency anemia   . Left hip pain   . NHL (non-Hodgkin's lymphoma) ([place]) 12/17/2010  . Obesity   . Primary thrombocytosis ([place]) 01/11/2006   Secondary to splenectomy    . PVD (peripheral vascular disease) ([place])   . S/P splenectomy 01/01/2014  . 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 Vertigo, labyrinthine     Current Outpatient Medications:  .  ALPRAZolam (XANAX) 0.5 MG tablet, Take 0.5 mg by mouth 2 (two) times daily as needed. , Disp: , Rfl: 0 .  amLODipine (NORVASC) 2.5 MG tablet, Take 2.5 mg by mouth daily., Disp: , Rfl: 2 .  aspirin 81 MG tablet, Take 81 mg by mouth daily., Disp: , Rfl:  .  atorvastatin (LIPITOR) 40 MG tablet, Take 40 mg by mouth daily., Disp: , Rfl: 2 .  cilostazol (PLETAL) 100 MG tablet, Take 100 mg by mouth 2 (two) times daily., Disp: , Rfl:  .  diclofenac (CATAFLAM) 50 MG tablet, Take 1 tablet (50 mg total) by mouth 2 (two) times daily., Disp: 60 tablet, Rfl: 2 .  hydrochlorothiazide (HYDRODIURIL) 12.5 MG tablet, , Disp: , Rfl:  .  hydroxyurea (HYDREA) 500 MG capsule, May take with food to minimize GI side effects., Disp: 90 capsule, Rfl: 3 .   ibuprofen (ADVIL,MOTRIN) 800 MG tablet, Take 800 mg by mouth every 8 (eight) hours as needed for moderate pain. , Disp: , Rfl: 0 .  losartan (COZAAR) 100 MG tablet, , Disp: , Rfl:  .  losartan-hydrochlorothiazide (HYZAAR) 100-12.5 MG per tablet, Take 1 tablet by mouth daily. Reported on 06/30/2015, Disp: , Rfl:  .  meclizine (ANTIVERT) 25 MG tablet, Take 1 tablet (25 mg total) by mouth 3 (three) times daily as needed for dizziness., Disp: 15 tablet, Rfl: 1 .  potassium chloride SA (K-DUR,KLOR-CON) 20 MEQ tablet, TAKE ONE TABLET BY MOUTH TWICE DAILY., Disp: 60 tablet, Rfl: 3 .  temazepam (RESTORIL) 15 MG capsule, Take 1 capsule (15 mg total) by mouth at bedtime as needed for sleep., Disp: 30 capsule, Rfl: 0 .  vitamin B-12 (CYANOCOBALAMIN) 1000 MCG tablet, Take 1,000 mcg by mouth daily., Disp: , Rfl:   Social History   Tobacco Use  Smoking Status Former Smoker  Smokeless Tobacco Never Used    Allergies  Allergen Reactions  . Penicillins Rash   Objective:   Vitals:   09/29/18 1455  BP: (!) 155/86  Temp: 97.9 F (36.6 C)   There is no height or weight on file to calculate BMI. Constitutional Well developed. Well nourished.  Vascular Dorsalis pedis pulses palpable  bilaterally. Posterior tibial pulses palpable bilaterally. Capillary refill normal to all digits.  No cyanosis or clubbing noted. Pedal hair growth normal.  Neurologic Normal speech. Oriented to person, place, and time. Epicritic sensation to light touch grossly present bilaterally.  Dermatologic Nails elongated dystrophic pain to palpation No open wounds. No skin lesions.  Orthopedic: Normal joint ROM without pain or crepitus bilaterally. No visible deformities. No bony tenderness.   Radiographs: None Assessment:   1. Pain due to onychomycosis of nail    Plan:  Patient was evaluated and treated and all questions answered.  Onychomycosis with pain -Nails palliatively debridement as below -Educated on  self-care  Procedure: Nail Debridement Rationale: Pain Type of Debridement: manual, sharp debridement. Instrumentation: Nail nipper, rotary burr. Number of Nails: 10    No follow-ups on file.

## 2018-10-03 DIAGNOSIS — D509 Iron deficiency anemia, unspecified: Secondary | ICD-10-CM | POA: Diagnosis not present

## 2018-10-03 DIAGNOSIS — Z0001 Encounter for general adult medical examination with abnormal findings: Secondary | ICD-10-CM | POA: Diagnosis not present

## 2018-10-03 DIAGNOSIS — I10 Essential (primary) hypertension: Secondary | ICD-10-CM | POA: Diagnosis not present

## 2018-10-24 DIAGNOSIS — Z961 Presence of intraocular lens: Secondary | ICD-10-CM | POA: Diagnosis not present

## 2018-10-24 DIAGNOSIS — H26493 Other secondary cataract, bilateral: Secondary | ICD-10-CM | POA: Diagnosis not present

## 2018-11-02 DIAGNOSIS — I10 Essential (primary) hypertension: Secondary | ICD-10-CM | POA: Diagnosis not present

## 2018-11-02 DIAGNOSIS — D509 Iron deficiency anemia, unspecified: Secondary | ICD-10-CM | POA: Diagnosis not present

## 2018-11-02 DIAGNOSIS — I739 Peripheral vascular disease, unspecified: Secondary | ICD-10-CM | POA: Diagnosis not present

## 2018-11-06 ENCOUNTER — Other Ambulatory Visit (HOSPITAL_COMMUNITY): Payer: Self-pay | Admitting: Internal Medicine

## 2018-11-06 DIAGNOSIS — D471 Chronic myeloproliferative disease: Secondary | ICD-10-CM

## 2018-11-06 NOTE — Telephone Encounter (Signed)
Dr. Delton Coombes, Please advise refill.

## 2018-11-07 NOTE — Telephone Encounter (Signed)
Refill request

## 2018-12-02 DIAGNOSIS — H5213 Myopia, bilateral: Secondary | ICD-10-CM | POA: Diagnosis not present

## 2018-12-02 DIAGNOSIS — I10 Essential (primary) hypertension: Secondary | ICD-10-CM | POA: Diagnosis not present

## 2018-12-02 DIAGNOSIS — E7800 Pure hypercholesterolemia, unspecified: Secondary | ICD-10-CM | POA: Diagnosis not present

## 2018-12-25 DIAGNOSIS — Z23 Encounter for immunization: Secondary | ICD-10-CM | POA: Diagnosis not present

## 2019-01-02 DIAGNOSIS — E785 Hyperlipidemia, unspecified: Secondary | ICD-10-CM | POA: Diagnosis not present

## 2019-01-02 DIAGNOSIS — I10 Essential (primary) hypertension: Secondary | ICD-10-CM | POA: Diagnosis not present

## 2019-01-15 ENCOUNTER — Other Ambulatory Visit: Payer: Self-pay

## 2019-01-15 ENCOUNTER — Inpatient Hospital Stay (HOSPITAL_COMMUNITY): Payer: Medicare Other | Attending: Hematology

## 2019-01-15 DIAGNOSIS — D509 Iron deficiency anemia, unspecified: Secondary | ICD-10-CM | POA: Diagnosis not present

## 2019-01-15 DIAGNOSIS — C946 Myelodysplastic disease, not classified: Secondary | ICD-10-CM | POA: Insufficient documentation

## 2019-01-15 LAB — CBC WITH DIFFERENTIAL/PLATELET
Abs Immature Granulocytes: 0.01 10*3/uL (ref 0.00–0.07)
Basophils Absolute: 0 10*3/uL (ref 0.0–0.1)
Basophils Relative: 0 %
Eosinophils Absolute: 0 10*3/uL (ref 0.0–0.5)
Eosinophils Relative: 1 %
HCT: 39.6 % (ref 36.0–46.0)
Hemoglobin: 13 g/dL (ref 12.0–15.0)
Immature Granulocytes: 0 %
Lymphocytes Relative: 41 %
Lymphs Abs: 3.1 10*3/uL (ref 0.7–4.0)
MCH: 38.1 pg — ABNORMAL HIGH (ref 26.0–34.0)
MCHC: 32.8 g/dL (ref 30.0–36.0)
MCV: 116.1 fL — ABNORMAL HIGH (ref 80.0–100.0)
Monocytes Absolute: 0.6 10*3/uL (ref 0.1–1.0)
Monocytes Relative: 8 %
Neutro Abs: 3.7 10*3/uL (ref 1.7–7.7)
Neutrophils Relative %: 50 %
Platelets: 511 10*3/uL — ABNORMAL HIGH (ref 150–400)
RBC: 3.41 MIL/uL — ABNORMAL LOW (ref 3.87–5.11)
RDW: 13.8 % (ref 11.5–15.5)
WBC: 7.4 10*3/uL (ref 4.0–10.5)
nRBC: 0 % (ref 0.0–0.2)

## 2019-01-15 LAB — COMPREHENSIVE METABOLIC PANEL
ALT: 14 U/L (ref 0–44)
AST: 16 U/L (ref 15–41)
Albumin: 3.6 g/dL (ref 3.5–5.0)
Alkaline Phosphatase: 69 U/L (ref 38–126)
Anion gap: 10 (ref 5–15)
BUN: 11 mg/dL (ref 8–23)
CO2: 24 mmol/L (ref 22–32)
Calcium: 8.9 mg/dL (ref 8.9–10.3)
Chloride: 107 mmol/L (ref 98–111)
Creatinine, Ser: 0.92 mg/dL (ref 0.44–1.00)
GFR calc Af Amer: >60 mL/min (ref >60)
GFR calc non Af Amer: 57 mL/min — ABNORMAL LOW (ref >60)
Glucose, Bld: 143 mg/dL — ABNORMAL HIGH (ref 70–99)
Potassium: 3.9 mmol/L (ref 3.5–5.1)
Sodium: 141 mmol/L (ref 135–145)
Total Bilirubin: 0.7 mg/dL (ref 0.3–1.2)
Total Protein: 7 g/dL (ref 6.5–8.1)

## 2019-01-15 LAB — IRON AND TIBC
Iron: 94 ug/dL (ref 28–170)
Saturation Ratios: 34 % — ABNORMAL HIGH (ref 10.4–31.8)
TIBC: 276 ug/dL (ref 250–450)
UIBC: 182 ug/dL

## 2019-01-15 LAB — LACTATE DEHYDROGENASE: LDH: 132 U/L (ref 98–192)

## 2019-01-15 LAB — VITAMIN D 25 HYDROXY (VIT D DEFICIENCY, FRACTURES): Vit D, 25-Hydroxy: 33.44 ng/mL (ref 30–100)

## 2019-01-15 LAB — FOLATE: Folate: 18.2 ng/mL (ref >5.9)

## 2019-01-15 LAB — FERRITIN: Ferritin: 56 ng/mL (ref 11–307)

## 2019-01-15 LAB — VITAMIN B12: Vitamin B-12: 734 pg/mL (ref 180–914)

## 2019-01-22 ENCOUNTER — Inpatient Hospital Stay (HOSPITAL_BASED_OUTPATIENT_CLINIC_OR_DEPARTMENT_OTHER): Payer: Medicare Other | Admitting: Hematology

## 2019-01-22 ENCOUNTER — Other Ambulatory Visit: Payer: Self-pay

## 2019-01-22 ENCOUNTER — Encounter (HOSPITAL_COMMUNITY): Payer: Self-pay | Admitting: Hematology

## 2019-01-22 DIAGNOSIS — D471 Chronic myeloproliferative disease: Secondary | ICD-10-CM

## 2019-01-22 NOTE — Progress Notes (Signed)
Virtual Visit via Telephone Note  I connected with Leslie Williamson on 01/22/19 at 11:45 AM EST by telephone and verified that I am speaking with the correct person using two identifiers.   I discussed the limitations, risks, security and privacy concerns of performing an evaluation and management service by telephone and the availability of in person appointments. I also discussed with the patient that there may be a patient responsible charge related to this service. The patient expressed understanding and agreed to proceed.   History of Present Illness: She is being seen in our clinic for myeloproliferative disorder and iron deficiency anemia.  She receives intermittent iron infusions.  She also had diffuse large B-cell lymphoma which was treated about 20 years ago.   Observations/Objective: She reports that she has been taking hydroxyurea 1 tablet daily without any problems.  Denies any mouth ulcers or leg ulcers.  Appetite and energy levels are 100%.  Numbness in the feet has been stable.  Denies any fevers, night sweats or weight loss in the last 6 months.  Denies any palpable lymph nodes.  No change in bowel habits reported.  Assessment and Plan:  1.  JAK2 positive myeloproliferative disorder: -Her major manifestation is thrombocytosis. -She is taking hydroxyurea 100 mg daily.  She does not report missing any doses. -We reviewed her blood work.  Platelets are 511.  Hematocrit is 39.6.  White count is 7.4. -I have recommended her to increase hydroxyurea to 2 tablets on Mondays, Wednesdays and Fridays and 1 tablet rest of the week. -We will reevaluate her in 2 months with repeat blood counts.  2.  Iron deficiency anemia: -Ferritin is 56 and percent saturation is 34. -She does have mild degree of chronic kidney disease. -Last Feraheme infusion was on 09/20/2018 and [id number]. -[id number] and folic acid levels are within normal limits. -She does not require any iron infusion at this time.  3.   Marginal zone lymphoma of the spleen: -She received chemotherapy in 1999. -She does not have any B symptoms at this time.   Follow Up Instructions: RTC 2 months with labs 1 to 2 days prior.   I discussed the assessment and treatment plan with the patient. The patient was provided an opportunity to ask questions and all were answered. The patient agreed with the plan and demonstrated an understanding of the instructions.   The patient was advised to call back or seek an in-person evaluation if the symptoms worsen or if the condition fails to improve as anticipated.  I provided 12 minutes of non-face-to-face time during this encounter.   Derek Jack, MD

## 2019-02-01 DIAGNOSIS — I739 Peripheral vascular disease, unspecified: Secondary | ICD-10-CM | POA: Diagnosis not present

## 2019-02-01 DIAGNOSIS — I119 Hypertensive heart disease without heart failure: Secondary | ICD-10-CM | POA: Diagnosis not present

## 2019-02-05 ENCOUNTER — Other Ambulatory Visit (HOSPITAL_COMMUNITY): Payer: Self-pay | Admitting: Hematology

## 2019-02-05 DIAGNOSIS — D471 Chronic myeloproliferative disease: Secondary | ICD-10-CM

## 2019-03-09 ENCOUNTER — Other Ambulatory Visit: Payer: Self-pay

## 2019-03-09 ENCOUNTER — Ambulatory Visit: Payer: Medicare Other | Attending: Internal Medicine

## 2019-03-09 DIAGNOSIS — Z20822 Contact with and (suspected) exposure to covid-19: Secondary | ICD-10-CM

## 2019-03-10 LAB — NOVEL CORONAVIRUS, NAA: SARS-CoV-2, NAA: NOT DETECTED

## 2019-03-20 DIAGNOSIS — C8330 Diffuse large B-cell lymphoma, unspecified site: Secondary | ICD-10-CM | POA: Diagnosis not present

## 2019-03-20 DIAGNOSIS — Z6830 Body mass index (BMI) 30.0-30.9, adult: Secondary | ICD-10-CM | POA: Diagnosis not present

## 2019-03-20 DIAGNOSIS — I739 Peripheral vascular disease, unspecified: Secondary | ICD-10-CM | POA: Diagnosis not present

## 2019-03-20 DIAGNOSIS — I10 Essential (primary) hypertension: Secondary | ICD-10-CM | POA: Diagnosis not present

## 2019-03-27 ENCOUNTER — Other Ambulatory Visit: Payer: Self-pay

## 2019-03-27 ENCOUNTER — Inpatient Hospital Stay (HOSPITAL_COMMUNITY): Payer: Medicare Other | Attending: Hematology

## 2019-03-27 DIAGNOSIS — D509 Iron deficiency anemia, unspecified: Secondary | ICD-10-CM | POA: Insufficient documentation

## 2019-03-27 DIAGNOSIS — D471 Chronic myeloproliferative disease: Secondary | ICD-10-CM

## 2019-03-27 LAB — IRON AND TIBC
Iron: 99 ug/dL (ref 28–170)
Saturation Ratios: 33 % — ABNORMAL HIGH (ref 10.4–31.8)
TIBC: 302 ug/dL (ref 250–450)
UIBC: 203 ug/dL

## 2019-03-27 LAB — CBC WITH DIFFERENTIAL/PLATELET
Abs Immature Granulocytes: 0.02 10*3/uL (ref 0.00–0.07)
Basophils Absolute: 0 10*3/uL (ref 0.0–0.1)
Basophils Relative: 0 %
Eosinophils Absolute: 0 10*3/uL (ref 0.0–0.5)
Eosinophils Relative: 1 %
HCT: 37.1 % (ref 36.0–46.0)
Hemoglobin: 12.2 g/dL (ref 12.0–15.0)
Immature Granulocytes: 0 %
Lymphocytes Relative: 40 %
Lymphs Abs: 3.2 10*3/uL (ref 0.7–4.0)
MCH: 37.4 pg — ABNORMAL HIGH (ref 26.0–34.0)
MCHC: 32.9 g/dL (ref 30.0–36.0)
MCV: 113.8 fL — ABNORMAL HIGH (ref 80.0–100.0)
Monocytes Absolute: 0.8 10*3/uL (ref 0.1–1.0)
Monocytes Relative: 10 %
Neutro Abs: 4 10*3/uL (ref 1.7–7.7)
Neutrophils Relative %: 49 %
Platelets: 457 10*3/uL — ABNORMAL HIGH (ref 150–400)
RBC: 3.26 MIL/uL — ABNORMAL LOW (ref 3.87–5.11)
RDW: 16.3 % — ABNORMAL HIGH (ref 11.5–15.5)
WBC: 8 10*3/uL (ref 4.0–10.5)
nRBC: 0.3 % — ABNORMAL HIGH (ref 0.0–0.2)

## 2019-03-27 LAB — COMPREHENSIVE METABOLIC PANEL
ALT: 12 U/L (ref 0–44)
AST: 14 U/L — ABNORMAL LOW (ref 15–41)
Albumin: 3.6 g/dL (ref 3.5–5.0)
Alkaline Phosphatase: 60 U/L (ref 38–126)
Anion gap: 9 (ref 5–15)
BUN: 26 mg/dL — ABNORMAL HIGH (ref 8–23)
CO2: 25 mmol/L (ref 22–32)
Calcium: 9.1 mg/dL (ref 8.9–10.3)
Chloride: 105 mmol/L (ref 98–111)
Creatinine, Ser: 1.32 mg/dL — ABNORMAL HIGH (ref 0.44–1.00)
GFR calc Af Amer: 43 mL/min — ABNORMAL LOW (ref >60)
GFR calc non Af Amer: 37 mL/min — ABNORMAL LOW (ref >60)
Glucose, Bld: 157 mg/dL — ABNORMAL HIGH (ref 70–99)
Potassium: 3.8 mmol/L (ref 3.5–5.1)
Sodium: 139 mmol/L (ref 135–145)
Total Bilirubin: 0.8 mg/dL (ref 0.3–1.2)
Total Protein: 7.1 g/dL (ref 6.5–8.1)

## 2019-03-27 LAB — FERRITIN: Ferritin: 38 ng/mL (ref 11–307)

## 2019-03-27 LAB — LACTATE DEHYDROGENASE: LDH: 124 U/L (ref 98–192)

## 2019-04-03 ENCOUNTER — Other Ambulatory Visit: Payer: Self-pay

## 2019-04-03 ENCOUNTER — Inpatient Hospital Stay (HOSPITAL_BASED_OUTPATIENT_CLINIC_OR_DEPARTMENT_OTHER): Payer: Medicare Other | Admitting: Hematology

## 2019-04-03 ENCOUNTER — Encounter (HOSPITAL_COMMUNITY): Payer: Self-pay | Admitting: Hematology

## 2019-04-03 DIAGNOSIS — D509 Iron deficiency anemia, unspecified: Secondary | ICD-10-CM

## 2019-04-03 DIAGNOSIS — D471 Chronic myeloproliferative disease: Secondary | ICD-10-CM | POA: Diagnosis not present

## 2019-04-03 NOTE — Progress Notes (Signed)
Virtual Visit via Telephone Note  I connected with Leslie Williamson on 04/03/19 at  4:05 PM EST by telephone and verified that I am speaking with the correct person using two identifiers.   I discussed the limitations, risks, security and privacy concerns of performing an evaluation and management service by telephone and the availability of in person appointments. I also discussed with the patient that there may be a patient responsible charge related to this service. The patient expressed understanding and agreed to proceed.   History of Present Illness: She is followed in our clinic for myeloproliferative disorders and iron deficiency anemia.  She received intermittent Feraheme infusions.  She also has a history of non-Hodgkin lymphoma which was treated more than 20 years ago.   Observations/Objective: She denies any fevers, night sweats or weight loss in the last 2 to 3 months.  Denies any bleeding per rectum or melena.  Energy levels are reasonably good at 50%.  Appetite is 100%.  Occasional dizziness reported.  No pain reported.  She is currently taking hydroxyurea 100 mg daily.  No ulcers or skin breakdown noted.  No GI symptoms.  Assessment and Plan:  1.  JAK2 positive myeloproliferative disorder: -Major manifestation is thrombocytosis. -She is taking hydroxyurea 500 mg daily without any problems. -We reviewed her labs which showed white count of 8.  Hematocrit is 37.1.  Platelet count is 457. -She will continue at the same dose level.  We plan to repeat her labs in 4 months.  2.  Iron deficiency state: -Last Feraheme infusion was on 09/20/2018 and 09/27/2018. -We reviewed latest ferritin which was 38 on 03/27/2019.  Prior to that it was 56 on 01/15/2019 and 8 on 09/11/2018. -We will plan to repeat her ferritin and iron panel at next visit.  She does not require any iron infusion at this time.  3.  Marginal zone lymphoma of the spleen: -She received chemotherapy 1999. -She does not  have any B symptoms at this time.   Follow Up Instructions: 4 months with labs.   I discussed the assessment and treatment plan with the patient. The patient was provided an opportunity to ask questions and all were answered. The patient agreed with the plan and demonstrated an understanding of the instructions.   The patient was advised to call back or seek an in-person evaluation if the symptoms worsen or if the condition fails to improve as anticipated.  I provided 11 minutes of non-face-to-face time during this encounter.   Derek Jack, MD

## 2019-04-21 DIAGNOSIS — J209 Acute bronchitis, unspecified: Secondary | ICD-10-CM | POA: Diagnosis not present

## 2019-04-21 DIAGNOSIS — I10 Essential (primary) hypertension: Secondary | ICD-10-CM | POA: Diagnosis not present

## 2019-05-03 ENCOUNTER — Other Ambulatory Visit (HOSPITAL_COMMUNITY): Payer: Self-pay | Admitting: Hematology

## 2019-05-03 DIAGNOSIS — D471 Chronic myeloproliferative disease: Secondary | ICD-10-CM

## 2019-05-19 DIAGNOSIS — J209 Acute bronchitis, unspecified: Secondary | ICD-10-CM | POA: Diagnosis not present

## 2019-05-19 DIAGNOSIS — I10 Essential (primary) hypertension: Secondary | ICD-10-CM | POA: Diagnosis not present

## 2019-06-19 DIAGNOSIS — F329 Major depressive disorder, single episode, unspecified: Secondary | ICD-10-CM | POA: Diagnosis not present

## 2019-06-19 DIAGNOSIS — I10 Essential (primary) hypertension: Secondary | ICD-10-CM | POA: Diagnosis not present

## 2019-07-03 DIAGNOSIS — I739 Peripheral vascular disease, unspecified: Secondary | ICD-10-CM | POA: Diagnosis not present

## 2019-07-03 DIAGNOSIS — I10 Essential (primary) hypertension: Secondary | ICD-10-CM | POA: Diagnosis not present

## 2019-07-03 DIAGNOSIS — D471 Chronic myeloproliferative disease: Secondary | ICD-10-CM | POA: Diagnosis not present

## 2019-07-03 DIAGNOSIS — D509 Iron deficiency anemia, unspecified: Secondary | ICD-10-CM | POA: Diagnosis not present

## 2019-08-01 ENCOUNTER — Inpatient Hospital Stay (HOSPITAL_COMMUNITY): Payer: Medicare Other | Attending: Hematology

## 2019-08-01 ENCOUNTER — Other Ambulatory Visit: Payer: Self-pay

## 2019-08-01 DIAGNOSIS — F419 Anxiety disorder, unspecified: Secondary | ICD-10-CM | POA: Diagnosis not present

## 2019-08-01 DIAGNOSIS — C946 Myelodysplastic disease, not classified: Secondary | ICD-10-CM | POA: Insufficient documentation

## 2019-08-01 DIAGNOSIS — I739 Peripheral vascular disease, unspecified: Secondary | ICD-10-CM | POA: Insufficient documentation

## 2019-08-01 DIAGNOSIS — D509 Iron deficiency anemia, unspecified: Secondary | ICD-10-CM | POA: Diagnosis not present

## 2019-08-01 DIAGNOSIS — I10 Essential (primary) hypertension: Secondary | ICD-10-CM | POA: Diagnosis not present

## 2019-08-01 DIAGNOSIS — E7800 Pure hypercholesterolemia, unspecified: Secondary | ICD-10-CM | POA: Insufficient documentation

## 2019-08-01 DIAGNOSIS — Z7982 Long term (current) use of aspirin: Secondary | ICD-10-CM | POA: Insufficient documentation

## 2019-08-01 DIAGNOSIS — Z79899 Other long term (current) drug therapy: Secondary | ICD-10-CM | POA: Insufficient documentation

## 2019-08-01 DIAGNOSIS — Z8572 Personal history of non-Hodgkin lymphomas: Secondary | ICD-10-CM | POA: Diagnosis not present

## 2019-08-01 DIAGNOSIS — E669 Obesity, unspecified: Secondary | ICD-10-CM | POA: Diagnosis not present

## 2019-08-01 DIAGNOSIS — D471 Chronic myeloproliferative disease: Secondary | ICD-10-CM

## 2019-08-01 DIAGNOSIS — Z87891 Personal history of nicotine dependence: Secondary | ICD-10-CM | POA: Insufficient documentation

## 2019-08-01 LAB — CBC WITH DIFFERENTIAL/PLATELET
Abs Immature Granulocytes: 0.01 10*3/uL (ref 0.00–0.07)
Basophils Absolute: 0 10*3/uL (ref 0.0–0.1)
Basophils Relative: 0 %
Eosinophils Absolute: 0 10*3/uL (ref 0.0–0.5)
Eosinophils Relative: 1 %
HCT: 35.9 % — ABNORMAL LOW (ref 36.0–46.0)
Hemoglobin: 12.1 g/dL (ref 12.0–15.0)
Immature Granulocytes: 0 %
Lymphocytes Relative: 43 %
Lymphs Abs: 3.2 10*3/uL (ref 0.7–4.0)
MCH: 38.4 pg — ABNORMAL HIGH (ref 26.0–34.0)
MCHC: 33.7 g/dL (ref 30.0–36.0)
MCV: 114 fL — ABNORMAL HIGH (ref 80.0–100.0)
Monocytes Absolute: 0.6 10*3/uL (ref 0.1–1.0)
Monocytes Relative: 8 %
Neutro Abs: 3.5 10*3/uL (ref 1.7–7.7)
Neutrophils Relative %: 48 %
Platelets: 396 10*3/uL (ref 150–400)
RBC: 3.15 MIL/uL — ABNORMAL LOW (ref 3.87–5.11)
RDW: 16.1 % — ABNORMAL HIGH (ref 11.5–15.5)
WBC: 7.3 10*3/uL (ref 4.0–10.5)
nRBC: 0 % (ref 0.0–0.2)

## 2019-08-01 LAB — COMPREHENSIVE METABOLIC PANEL
ALT: 11 U/L (ref 0–44)
AST: 14 U/L — ABNORMAL LOW (ref 15–41)
Albumin: 3.4 g/dL — ABNORMAL LOW (ref 3.5–5.0)
Alkaline Phosphatase: 58 U/L (ref 38–126)
Anion gap: 9 (ref 5–15)
BUN: 28 mg/dL — ABNORMAL HIGH (ref 8–23)
CO2: 24 mmol/L (ref 22–32)
Calcium: 9.1 mg/dL (ref 8.9–10.3)
Chloride: 105 mmol/L (ref 98–111)
Creatinine, Ser: 1.3 mg/dL — ABNORMAL HIGH (ref 0.44–1.00)
GFR calc Af Amer: 43 mL/min — ABNORMAL LOW (ref >60)
GFR calc non Af Amer: 37 mL/min — ABNORMAL LOW (ref >60)
Glucose, Bld: 168 mg/dL — ABNORMAL HIGH (ref 70–99)
Potassium: 3.8 mmol/L (ref 3.5–5.1)
Sodium: 138 mmol/L (ref 135–145)
Total Bilirubin: 0.6 mg/dL (ref 0.3–1.2)
Total Protein: 7.4 g/dL (ref 6.5–8.1)

## 2019-08-01 LAB — IRON AND TIBC
Iron: 82 ug/dL (ref 28–170)
Saturation Ratios: 25 % (ref 10.4–31.8)
TIBC: 329 ug/dL (ref 250–450)
UIBC: 247 ug/dL

## 2019-08-01 LAB — FERRITIN: Ferritin: 19 ng/mL (ref 11–307)

## 2019-08-01 LAB — LACTATE DEHYDROGENASE: LDH: 137 U/L (ref 98–192)

## 2019-08-03 ENCOUNTER — Other Ambulatory Visit (HOSPITAL_COMMUNITY): Payer: Self-pay | Admitting: Hematology

## 2019-08-03 DIAGNOSIS — F329 Major depressive disorder, single episode, unspecified: Secondary | ICD-10-CM | POA: Diagnosis not present

## 2019-08-03 DIAGNOSIS — J209 Acute bronchitis, unspecified: Secondary | ICD-10-CM | POA: Diagnosis not present

## 2019-08-03 DIAGNOSIS — D471 Chronic myeloproliferative disease: Secondary | ICD-10-CM

## 2019-08-07 ENCOUNTER — Ambulatory Visit (HOSPITAL_COMMUNITY): Payer: Medicare Other | Admitting: Hematology

## 2019-08-09 ENCOUNTER — Inpatient Hospital Stay (HOSPITAL_COMMUNITY): Payer: Medicare Other | Admitting: Hematology

## 2019-08-09 ENCOUNTER — Other Ambulatory Visit: Payer: Self-pay

## 2019-08-09 VITALS — BP 103/67 | HR 95 | Temp 98.5°F | Resp 18 | Wt 161.4 lb

## 2019-08-09 DIAGNOSIS — E7800 Pure hypercholesterolemia, unspecified: Secondary | ICD-10-CM | POA: Diagnosis not present

## 2019-08-09 DIAGNOSIS — D509 Iron deficiency anemia, unspecified: Secondary | ICD-10-CM | POA: Diagnosis not present

## 2019-08-09 DIAGNOSIS — Z7982 Long term (current) use of aspirin: Secondary | ICD-10-CM | POA: Diagnosis not present

## 2019-08-09 DIAGNOSIS — Z79899 Other long term (current) drug therapy: Secondary | ICD-10-CM | POA: Diagnosis not present

## 2019-08-09 DIAGNOSIS — F419 Anxiety disorder, unspecified: Secondary | ICD-10-CM | POA: Diagnosis not present

## 2019-08-09 DIAGNOSIS — Z87891 Personal history of nicotine dependence: Secondary | ICD-10-CM | POA: Diagnosis not present

## 2019-08-09 DIAGNOSIS — I739 Peripheral vascular disease, unspecified: Secondary | ICD-10-CM | POA: Diagnosis not present

## 2019-08-09 DIAGNOSIS — Z8572 Personal history of non-Hodgkin lymphomas: Secondary | ICD-10-CM | POA: Diagnosis not present

## 2019-08-09 DIAGNOSIS — C946 Myelodysplastic disease, not classified: Secondary | ICD-10-CM | POA: Diagnosis not present

## 2019-08-09 DIAGNOSIS — I10 Essential (primary) hypertension: Secondary | ICD-10-CM | POA: Diagnosis not present

## 2019-08-09 DIAGNOSIS — D471 Chronic myeloproliferative disease: Secondary | ICD-10-CM

## 2019-08-09 DIAGNOSIS — E669 Obesity, unspecified: Secondary | ICD-10-CM | POA: Diagnosis not present

## 2019-08-09 NOTE — Progress Notes (Signed)
Leslie Williamson, Leslie Williamson   CLINIC:  Medical Oncology/Hematology  PCP:  Rosita Fire, MD [place] / [place] [id number]  [phone]  REASON FOR VISIT:  Follow-up for myeloproliferative disorder and iron deficiency anemia  CURRENT THERAPY: Hydrea  INTERVAL HISTORY:  Leslie Williamson, a 86 y.o. female, returns for routine follow-up for her myeloproliferative disorder and iron deficiency anemia. Leslie Williamson was last contacted via phone on 04/03/2019.  She is taking the Hydrea daily for the past 2 years. She denies any F/C, unexpected weight loss, or itching after a hot shower. She denies having any sores or abnormal bleeding. She has not taken iron tablets before. She takes ASA 81 daily.   REVIEW OF SYSTEMS:  Review of Systems  Constitutional: Positive for appetite change (mildly decreased) and fatigue (severe). Negative for chills, fever and unexpected weight change.  Skin: Negative for itching and wound.  Neurological: Positive for dizziness.  Hematological: Negative for adenopathy. Does not bruise/bleed easily.  All other systems reviewed and are negative.   PAST MEDICAL/SURGICAL HISTORY:  Past Medical History:  Diagnosis Date  . Anxiety   . Complication of anesthesia    difficult to wake up  . Depression   . Hypercholesteremia   . Hypertension   . Hypokalemia   . Iron deficiency anemia   . Left hip pain   . NHL (non-Hodgkin's lymphoma) ([place]) 12/17/2010  . Obesity   . Primary thrombocytosis ([place]) 01/11/2006   Secondary to splenectomy    . PVD (peripheral vascular disease) ([place])   . S/P splenectomy 01/01/2014  . 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 Vertigo, labyrinthine    Past Surgical History:  Procedure Laterality Date  . CATARACT EXTRACTION W/PHACO Left 11/03/2015   Procedure: CATARACT EXTRACTION PHACO AND INTRAOCULAR  LENS PLACEMENT (IOC);  Surgeon: Tonny Branch, MD;  Location: [facility];  Service: Ophthalmology;  Laterality: Left;  CDE: 7.74  . CATARACT EXTRACTION W/PHACO Right 11/20/2015   Procedure: CATARACT EXTRACTION PHACO AND INTRAOCULAR LENS PLACEMENT ; CDE:  8.30;  Surgeon: Tonny Branch, MD;  Location: [facility];  Service: Ophthalmology;  Laterality: Right;  . PORT-A-CATH REMOVAL      SOCIAL HISTORY:  Social History   Socioeconomic History  . Marital status: Widowed    Spouse name: Not on file  . Number of children: Not on file  . Years of education: Not on file  . Highest education level: Not on file  Occupational History  . Not on file  Tobacco Use  . Smoking status: Former Research scientist (life sciences)  . Smokeless tobacco: Never Used  Vaping Use  . Vaping Use: Never used  Substance and Sexual Activity  . Alcohol use: No  . Drug use: No  . Sexual activity: Never  Other Topics Concern  . Not on file  Social History Narrative  . Not on file   Social Determinants of Health   Financial Resource Strain:   . Difficulty of Paying Living Expenses:   Food Insecurity:   . Worried About Charity fundraiser in the Last Year:   . Arboriculturist in the Last Year:   Transportation Needs:   . Film/video editor (Medical):   Marland Kitchen Lack of Transportation (Non-Medical):   Physical Activity:   . Days of Exercise per Week:   . Minutes of Exercise per Session:   Stress:   .  Feeling of Stress :   Social Connections:   . Frequency of Communication with Friends and Family:   . Frequency of Social Gatherings with Friends and Family:   . Attends Religious Services:   . Active Member of Clubs or Organizations:   . Attends Archivist Meetings:   Marland Kitchen Marital Status:   Intimate Partner Violence:   . Fear of Current or Ex-Partner:   . Emotionally Abused:   Marland Kitchen Physically Abused:   . Sexually Abused:     FAMILY HISTORY:  Family History  Problem Relation Age of Onset  . Diabetes Mother     CURRENT MEDICATIONS:    Current Outpatient Medications  Medication Sig Dispense Refill  . amLODipine (NORVASC) 2.5 MG tablet Take 2.5 mg by mouth daily.  2  . aspirin 81 MG tablet Take 81 mg by mouth daily.    Marland Kitchen atorvastatin (LIPITOR) 40 MG tablet Take 40 mg by mouth daily.  2  . cilostazol (PLETAL) 100 MG tablet Take 100 mg by mouth 2 (two) times daily.    . diclofenac (CATAFLAM) 50 MG tablet Take 1 tablet (50 mg total) by mouth 2 (two) times daily. 60 tablet 2  . hydrochlorothiazide (HYDRODIURIL) 12.5 MG tablet Take 12.5 mg by mouth daily.    . hydroxyurea (HYDREA) 500 MG capsule TAKE 1 CAPSULE BY MOUTH DAILY. 90 capsule 0  . loratadine (CLARITIN) 10 MG tablet Take 10 mg by mouth daily.    Marland Kitchen losartan (COZAAR) 100 MG tablet Take 100 mg by mouth daily.    . potassium chloride SA (K-DUR,KLOR-CON) 20 MEQ tablet TAKE ONE TABLET BY MOUTH TWICE DAILY. 60 tablet 3  . benzonatate (TESSALON) 100 MG capsule Take 100 mg by mouth 3 (three) times daily as needed. (Patient not taking: Reported on 08/09/2019)    . ibuprofen (ADVIL,MOTRIN) 800 MG tablet Take 800 mg by mouth every 8 (eight) hours as needed for moderate pain.  (Patient not taking: Reported on 08/09/2019)  0  . temazepam (RESTORIL) 15 MG capsule Take 1 capsule (15 mg total) by mouth at bedtime as needed for sleep. (Patient not taking: Reported on 08/09/2019) 30 capsule 0   No current facility-administered medications for this visit.    ALLERGIES:  Allergies  Allergen Reactions  . Penicillins Rash    PHYSICAL EXAM:  Performance status (ECOG): 0 - Asymptomatic  Vitals:   08/09/19 1507  BP: 103/67  Pulse: 95  Resp: 18  Temp: 98.5 F (36.9 C)  SpO2: 98%   Wt Readings from Last 3 Encounters:  08/09/19 161 lb 6.4 oz (73.2 kg)  09/18/18 152 lb 8 oz (69.2 kg)  05/18/18 170 lb 9.6 oz (77.4 kg)   Physical Exam Vitals reviewed.  Constitutional:      Appearance: Normal appearance.  Cardiovascular:     Rate and Rhythm: Normal rate and regular rhythm.      Heart sounds: Normal heart sounds.  Pulmonary:     Effort: Pulmonary effort is normal.     Breath sounds: Normal breath sounds.  Abdominal:     Palpations: Abdomen is soft. There is no mass.  Neurological:     Mental Status: She is alert and oriented to person, place, and time.  Psychiatric:        Mood and Affect: Mood normal.     LABORATORY DATA:  I have reviewed the labs as listed.  CBC Latest Ref Rng & Units 08/01/2019 03/27/2019 01/15/2019  WBC 4.0 - 10.5 K/uL 7.3 8.0 7.4  Hemoglobin 12.0 - 15.0 g/dL 12.1 12.2 13.0  Hematocrit 36 - 46 % 35.9(L) 37.1 39.6  Platelets 150 - 400 K/uL 396 457(H) 511(H)   CMP Latest Ref Rng & Units 08/01/2019 03/27/2019 01/15/2019  Glucose 70 - 99 mg/dL 168(H) 157(H) 143(H)  BUN 8 - 23 mg/dL 28(H) 26(H) 11  Creatinine 0.44 - 1.00 mg/dL 1.30(H) 1.32(H) 0.92  Sodium 135 - 145 mmol/L 138 139 141  Potassium 3.5 - 5.1 mmol/L 3.8 3.8 3.9  Chloride 98 - 111 mmol/L 105 105 107  CO2 22 - 32 mmol/L 24 25 24   Calcium 8.9 - 10.3 mg/dL 9.1 9.1 8.9  Total Protein 6.5 - 8.1 g/dL 7.4 7.1 7.0  Total Bilirubin 0.3 - 1.2 mg/dL 0.6 0.8 0.7  Alkaline Phos 38 - 126 U/L 58 60 69  AST 15 - 41 U/L 14(L) 14(L) 16  ALT 0 - 44 U/L 11 12 14       Component Value Date/Time   RBC 3.15 (L) 08/01/2019 1326   MCV 114.0 (H) 08/01/2019 1326   MCH 38.4 (H) 08/01/2019 1326   MCHC 33.7 08/01/2019 1326   RDW 16.1 (H) 08/01/2019 1326   LYMPHSABS 3.2 08/01/2019 1326   MONOABS 0.6 08/01/2019 1326   EOSABS 0.0 08/01/2019 1326   BASOSABS 0.0 08/01/2019 1326   Lab Results  Component Value Date   TIBC 329 08/01/2019   TIBC 302 03/27/2019   TIBC 276 01/15/2019   FERRITIN 19 08/01/2019   FERRITIN 38 03/27/2019   FERRITIN 56 01/15/2019   IRONPCTSAT 25 08/01/2019   IRONPCTSAT 33 (H) 03/27/2019   IRONPCTSAT 34 (H) 01/15/2019    DIAGNOSTIC IMAGING:  I have independently reviewed the scans and discussed with the patient.   ASSESSMENT:  1.  JAK2 positive myeloproliferative  disorder: -Major manifestation is thrombocytosis.  She also has previous history of splenectomy. -She is on 2 tablets of Hydrea on Monday, Wednesday and Friday and 1 tablet rest of the week and aspirin 81 mg daily.  2.  Iron deficiency state: -Feraheme infusion on 09/20/2018 and 09/27/2018.  3.  Marginal zone lymphoma of the spleen: -Diagnosed in 1999, status post CHOP therapy x7 cycles followed by rituximab for 1 cycle. -Posttreatment CT scan showed complete response.   PLAN:  1.  JAK2 positive myeloproliferative disorder: -We reviewed her labs today.  Platelet count is 396.  Hematocrit is 35.9.  Hemoglobin 12.1.  White count is 7.3. -She will continue hydroxyurea at 2 tablets on Mondays, Wednesdays and Fridays and 1 tablet rest of the week. -We will plan to reevaluate her in 3 months.  2.  Iron deficiency state: -Hemoglobin 12.1.  Ferritin is 19.  Percent saturation is 25. -She complains of low energy levels.  We have recommended weekly Feraheme x2.  3.  Marginal zone lymphoma of the spleen: -No B symptoms at this time.  Orders placed this encounter:  No orders of the defined types were placed in this encounter.    Derek Jack, MD [place], [place] [phone]   I, Milinda Antis, am acting as a scribe for Dr. Sanda Linger.  I, Derek Jack MD, have reviewed the above documentation for accuracy and completeness, and I agree with the above.

## 2019-08-09 NOTE — Patient Instructions (Addendum)
[place] at [facility] Discharge Instructions  You were seen today by Dr. Delton Coombes. He went over your recent results. You will be scheduled for 2 weekly iron infusions. Start taking an iron tablet daily. Dr. Delton Coombes will see you back in 3 months for labs and follow up.   Thank you for choosing [place] at [facility] to provide your oncology and hematology care.  To afford each patient quality time with our provider, please arrive at least 15 minutes before your scheduled appointment time.   If you have a lab appointment with the [facility] please come in thru the Main Entrance and check in at the main information desk  You need to re-schedule your appointment should you arrive 10 or more minutes late.  We strive to give you quality time with our providers, and arriving late affects you and other patients whose appointments are after yours.  Also, if you no show three or more times for appointments you may be dismissed from the clinic at the providers discretion.     Again, thank you for choosing [facility].  Our hope is that these requests will decrease the amount of time that you wait before being seen by our physicians.       _____________________________________________________________  Should you have questions after your visit to [facility], please contact our office at [phone] between the hours of 8:00 a.m. and 4:30 p.m.  Voicemails left after 4:00 p.m. will not be returned until the following business day.  For prescription refill requests, have your pharmacy contact our office and allow 72 hours.    Cancer Center Support Programs:   > Cancer Support Group  2nd Tuesday of the month 1pm-2pm, Journey Room

## 2019-08-16 ENCOUNTER — Encounter (HOSPITAL_COMMUNITY): Payer: Self-pay

## 2019-08-16 ENCOUNTER — Inpatient Hospital Stay (HOSPITAL_COMMUNITY): Payer: Medicare Other

## 2019-08-16 ENCOUNTER — Other Ambulatory Visit: Payer: Self-pay

## 2019-08-16 VITALS — BP 112/46 | HR 88 | Temp 97.5°F | Resp 16

## 2019-08-16 DIAGNOSIS — E669 Obesity, unspecified: Secondary | ICD-10-CM | POA: Diagnosis not present

## 2019-08-16 DIAGNOSIS — Z79899 Other long term (current) drug therapy: Secondary | ICD-10-CM | POA: Diagnosis not present

## 2019-08-16 DIAGNOSIS — E7800 Pure hypercholesterolemia, unspecified: Secondary | ICD-10-CM | POA: Diagnosis not present

## 2019-08-16 DIAGNOSIS — Z87891 Personal history of nicotine dependence: Secondary | ICD-10-CM | POA: Diagnosis not present

## 2019-08-16 DIAGNOSIS — C946 Myelodysplastic disease, not classified: Secondary | ICD-10-CM | POA: Diagnosis not present

## 2019-08-16 DIAGNOSIS — D509 Iron deficiency anemia, unspecified: Secondary | ICD-10-CM | POA: Diagnosis not present

## 2019-08-16 DIAGNOSIS — Z8572 Personal history of non-Hodgkin lymphomas: Secondary | ICD-10-CM | POA: Diagnosis not present

## 2019-08-16 DIAGNOSIS — I739 Peripheral vascular disease, unspecified: Secondary | ICD-10-CM | POA: Diagnosis not present

## 2019-08-16 DIAGNOSIS — I10 Essential (primary) hypertension: Secondary | ICD-10-CM | POA: Diagnosis not present

## 2019-08-16 DIAGNOSIS — Z7982 Long term (current) use of aspirin: Secondary | ICD-10-CM | POA: Diagnosis not present

## 2019-08-16 DIAGNOSIS — F419 Anxiety disorder, unspecified: Secondary | ICD-10-CM | POA: Diagnosis not present

## 2019-08-16 MED ORDER — LORATADINE 10 MG PO TABS
10.0000 mg | ORAL_TABLET | Freq: Once | ORAL | Status: AC
  Administered 2019-08-16: 10 mg via ORAL
  Filled 2019-08-16: qty 1

## 2019-08-16 MED ORDER — FAMOTIDINE 20 MG PO TABS
20.0000 mg | ORAL_TABLET | Freq: Once | ORAL | Status: AC
  Administered 2019-08-16: 20 mg via ORAL
  Filled 2019-08-16: qty 1

## 2019-08-16 MED ORDER — SODIUM CHLORIDE 0.9 % IV SOLN
510.0000 mg | Freq: Once | INTRAVENOUS | Status: AC
  Administered 2019-08-16: 510 mg via INTRAVENOUS
  Filled 2019-08-16: qty 510

## 2019-08-16 MED ORDER — SODIUM CHLORIDE 0.9 % IV SOLN: Freq: Once | INTRAVENOUS | Status: AC

## 2019-08-16 MED ORDER — ACETAMINOPHEN 325 MG PO TABS
650.0000 mg | ORAL_TABLET | Freq: Once | ORAL | Status: AC
  Administered 2019-08-16: 650 mg via ORAL
  Filled 2019-08-16: qty 2

## 2019-08-16 NOTE — Progress Notes (Signed)
Iron infusion given per orders. Patient tolerated it well without problems. Vitals stable and discharged home from clinic ambulatory. Follow up as scheduled.  

## 2019-08-22 DIAGNOSIS — R7989 Other specified abnormal findings of blood chemistry: Secondary | ICD-10-CM | POA: Diagnosis not present

## 2019-08-22 DIAGNOSIS — I10 Essential (primary) hypertension: Secondary | ICD-10-CM | POA: Diagnosis not present

## 2019-08-22 DIAGNOSIS — C8330 Diffuse large B-cell lymphoma, unspecified site: Secondary | ICD-10-CM | POA: Diagnosis not present

## 2019-08-24 ENCOUNTER — Inpatient Hospital Stay (HOSPITAL_COMMUNITY): Payer: Medicare Other | Attending: Nurse Practitioner

## 2019-08-24 VITALS — BP 108/67 | HR 73 | Temp 98.5°F | Resp 18

## 2019-08-24 DIAGNOSIS — Z79899 Other long term (current) drug therapy: Secondary | ICD-10-CM | POA: Diagnosis not present

## 2019-08-24 DIAGNOSIS — D509 Iron deficiency anemia, unspecified: Secondary | ICD-10-CM | POA: Insufficient documentation

## 2019-08-24 MED ORDER — LORATADINE 10 MG PO TABS
10.0000 mg | ORAL_TABLET | Freq: Once | ORAL | Status: AC
  Administered 2019-08-24: 10 mg via ORAL
  Filled 2019-08-24: qty 1

## 2019-08-24 MED ORDER — SODIUM CHLORIDE 0.9 % IV SOLN
510.0000 mg | Freq: Once | INTRAVENOUS | Status: AC
  Administered 2019-08-24: 510 mg via INTRAVENOUS
  Filled 2019-08-24: qty 510

## 2019-08-24 MED ORDER — FAMOTIDINE 20 MG PO TABS
20.0000 mg | ORAL_TABLET | Freq: Once | ORAL | Status: AC
  Administered 2019-08-24: 20 mg via ORAL
  Filled 2019-08-24: qty 1

## 2019-08-24 MED ORDER — SODIUM CHLORIDE 0.9 % IV SOLN: Freq: Once | INTRAVENOUS | Status: AC

## 2019-08-24 MED ORDER — ACETAMINOPHEN 325 MG PO TABS
650.0000 mg | ORAL_TABLET | Freq: Once | ORAL | Status: AC
  Administered 2019-08-24: 650 mg via ORAL
  Filled 2019-08-24: qty 2

## 2019-08-24 NOTE — Patient Instructions (Signed)
[facility] at [facility]  Discharge Instructions:   _______________________________________________________________  Thank you for choosing [facility] at [facility] to provide your oncology and hematology care.  To afford each patient quality time with our providers, please arrive at least 15 minutes before your scheduled appointment.  You need to re-schedule your appointment if you arrive 10 or more minutes late.  We strive to give you quality time with our providers, and arriving late affects you and other patients whose appointments are after yours.  Also, if you no show three or more times for appointments you may be dismissed from the clinic.  Again, thank you for choosing [facility]. Our hope is that these requests will allow you access to exceptional care and in a timely manner. _______________________________________________________________  If you have questions after your visit, please contact our office at [phone] between the hours of 8:30 a.m. and 5:00 p.m. Voicemails left after 4:30 p.m. will not be returned until the following business day. _______________________________________________________________  For prescription refill requests, have your pharmacy contact our office. _______________________________________________________________  Recommendations made by the consultant and any test results will be sent to your referring physician. _______________________________________________________________ 

## 2019-08-24 NOTE — Progress Notes (Signed)
Iron infusion given per orders. Patient tolerated it well without problems. Vitals stable and discharged home from clinic ambulatory. Follow up as scheduled.  

## 2019-09-11 DIAGNOSIS — Z79899 Other long term (current) drug therapy: Secondary | ICD-10-CM | POA: Diagnosis not present

## 2019-09-11 DIAGNOSIS — I10 Essential (primary) hypertension: Secondary | ICD-10-CM | POA: Diagnosis not present

## 2019-09-28 DIAGNOSIS — I10 Essential (primary) hypertension: Secondary | ICD-10-CM | POA: Diagnosis not present

## 2019-09-28 DIAGNOSIS — Z79899 Other long term (current) drug therapy: Secondary | ICD-10-CM | POA: Diagnosis not present

## 2019-10-23 ENCOUNTER — Ambulatory Visit (HOSPITAL_COMMUNITY)
Admission: RE | Admit: 2019-10-23 | Discharge: 2019-10-23 | Disposition: A | Discharge status: Home / Self Care | Payer: Medicare Other | Source: Ambulatory Visit | Attending: Internal Medicine | Admitting: Internal Medicine

## 2019-10-23 ENCOUNTER — Other Ambulatory Visit: Payer: Self-pay | Admitting: Internal Medicine

## 2019-10-23 ENCOUNTER — Other Ambulatory Visit (HOSPITAL_COMMUNITY): Payer: Self-pay | Admitting: Internal Medicine

## 2019-10-23 ENCOUNTER — Other Ambulatory Visit: Payer: Self-pay

## 2019-10-23 DIAGNOSIS — Z9049 Acquired absence of other specified parts of digestive tract: Secondary | ICD-10-CM | POA: Diagnosis not present

## 2019-10-23 DIAGNOSIS — R109 Unspecified abdominal pain: Secondary | ICD-10-CM | POA: Insufficient documentation

## 2019-10-23 DIAGNOSIS — C8330 Diffuse large B-cell lymphoma, unspecified site: Secondary | ICD-10-CM | POA: Diagnosis not present

## 2019-10-23 DIAGNOSIS — I10 Essential (primary) hypertension: Secondary | ICD-10-CM | POA: Diagnosis not present

## 2019-10-23 DIAGNOSIS — R1030 Lower abdominal pain, unspecified: Secondary | ICD-10-CM | POA: Diagnosis not present

## 2019-10-23 DIAGNOSIS — N1330 Unspecified hydronephrosis: Secondary | ICD-10-CM | POA: Diagnosis not present

## 2019-10-25 DIAGNOSIS — B029 Zoster without complications: Secondary | ICD-10-CM | POA: Diagnosis not present

## 2019-10-25 DIAGNOSIS — C8330 Diffuse large B-cell lymphoma, unspecified site: Secondary | ICD-10-CM | POA: Diagnosis not present

## 2019-10-25 DIAGNOSIS — I10 Essential (primary) hypertension: Secondary | ICD-10-CM | POA: Diagnosis not present

## 2019-11-04 ENCOUNTER — Ambulatory Visit: Payer: Self-pay

## 2019-11-06 ENCOUNTER — Inpatient Hospital Stay (HOSPITAL_COMMUNITY): Payer: Medicare Other | Attending: Hematology

## 2019-11-06 ENCOUNTER — Other Ambulatory Visit (HOSPITAL_COMMUNITY): Payer: Self-pay | Admitting: Nurse Practitioner

## 2019-11-06 DIAGNOSIS — D471 Chronic myeloproliferative disease: Secondary | ICD-10-CM

## 2019-11-13 ENCOUNTER — Ambulatory Visit (HOSPITAL_COMMUNITY): Payer: Medicare Other | Admitting: Hematology

## 2019-11-14 ENCOUNTER — Ambulatory Visit
Admission: EM | Admit: 2019-11-14 | Discharge: 2019-11-14 | Disposition: A | Discharge status: Home / Self Care | Payer: Medicare Other

## 2019-11-14 ENCOUNTER — Encounter: Payer: Self-pay | Admitting: Emergency Medicine

## 2019-11-14 ENCOUNTER — Other Ambulatory Visit: Payer: Self-pay

## 2019-11-14 DIAGNOSIS — B0229 Other postherpetic nervous system involvement: Secondary | ICD-10-CM | POA: Diagnosis not present

## 2019-11-14 MED ORDER — DEXAMETHASONE SODIUM PHOSPHATE 10 MG/ML IJ SOLN
10.0000 mg | Freq: Once | INTRAMUSCULAR | Status: AC
  Administered 2019-11-14: 10 mg via INTRAMUSCULAR

## 2019-11-14 MED ORDER — PREDNISONE 20 MG PO TABS: 20.0000 mg | ORAL_TABLET | Freq: Two times a day (BID) | ORAL | 0 refills | Status: AC

## 2019-11-14 NOTE — ED Triage Notes (Signed)
Pain to LT lower side near the area where she had shingles. States the pain is radiating around to her back and up her stomach.

## 2019-11-14 NOTE — ED Provider Notes (Signed)
[facility]   [id number] 11/14/19 Arrival Time: 5852  CC: Shingles rash not getting better  SUBJECTIVE:  Leslie Williamson is a 86 y.o. female who presents with a skin rash x 1 month.  Reports shingles outbreak to LT lower side.  Localizes the rash to LT lower back and wraps around towards abdomen.  Describes it as intermittent, throbbing and itching.  Has tried OTC cream without relief.  Denies aggravating factors.  Denies similar symptoms in the past.   Denies fever, chills, nausea, vomiting, erythema, swelling, discharge.    ROS: As per HPI.  All other pertinent ROS negative.     Past Medical History:  Diagnosis Date  . Anxiety   . Complication of anesthesia    difficult to wake up  . Depression   . Hypercholesteremia   . Hypertension   . Hypokalemia   . Iron deficiency anemia   . Left hip pain   . NHL (non-Hodgkin's lymphoma) ([place]) 12/17/2010  . Obesity   . Primary thrombocytosis ([place]) 01/11/2006   Secondary to splenectomy    . PVD (peripheral vascular disease) ([place])   . S/P splenectomy 01/01/2014  . 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 Vertigo, labyrinthine    Past Surgical History:  Procedure Laterality Date  . CATARACT EXTRACTION W/PHACO Left 11/03/2015   Procedure: CATARACT EXTRACTION PHACO AND INTRAOCULAR LENS PLACEMENT (IOC);  Surgeon: Tonny Branch, MD;  Location: [facility];  Service: Ophthalmology;  Laterality: Left;  CDE: 7.74  . CATARACT EXTRACTION W/PHACO Right 11/20/2015   Procedure: CATARACT EXTRACTION PHACO AND INTRAOCULAR LENS PLACEMENT ; CDE:  8.30;  Surgeon: Tonny Branch, MD;  Location: [facility];  Service: Ophthalmology;  Laterality: Right;  . PORT-A-CATH REMOVAL     Allergies  Allergen Reactions  . Penicillins Rash   No current facility-administered medications on file prior to encounter.   Current Outpatient Medications on File Prior to Encounter  Medication Sig  Dispense Refill  . amLODipine (NORVASC) 2.5 MG tablet Take 2.5 mg by mouth daily.  2  . aspirin 81 MG tablet Take 81 mg by mouth daily.    Marland Kitchen atorvastatin (LIPITOR) 40 MG tablet Take 40 mg by mouth daily.  2  . benzonatate (TESSALON) 100 MG capsule Take 100 mg by mouth 3 (three) times daily as needed. (Patient not taking: Reported on 08/09/2019)    . cilostazol (PLETAL) 100 MG tablet Take 100 mg by mouth 2 (two) times daily.    . diclofenac (CATAFLAM) 50 MG tablet Take 1 tablet (50 mg total) by mouth 2 (two) times daily. 60 tablet 2  . gabapentin (NEURONTIN) 300 MG capsule Take by mouth.    . hydroxyurea (HYDREA) 500 MG capsule TAKE 1 CAPSULE BY MOUTH DAILY. 90 capsule 0  . ibuprofen (ADVIL,MOTRIN) 800 MG tablet Take 800 mg by mouth every 8 (eight) hours as needed for moderate pain.  (Patient not taking: Reported on 08/09/2019)  0  . loratadine (CLARITIN) 10 MG tablet Take 10 mg by mouth daily.    Marland Kitchen losartan (COZAAR) 100 MG tablet Take 100 mg by mouth daily.    . temazepam (RESTORIL) 15 MG capsule Take 1 capsule (15 mg total) by mouth at bedtime as needed for sleep. (Patient not taking: Reported on 08/09/2019) 30 capsule 0   Social History   Socioeconomic History  . Marital status: Widowed    Spouse name: Not on file  . Number  of children: Not on file  . Years of education: Not on file  . Highest education level: Not on file  Occupational History  . Not on file  Tobacco Use  . Smoking status: Former Research scientist (life sciences)  . Smokeless tobacco: Never Used  Vaping Use  . Vaping Use: Never used  Substance and Sexual Activity  . Alcohol use: No  . Drug use: No  . Sexual activity: Never  Other Topics Concern  . Not on file  Social History Narrative  . Not on file   Social Determinants of Health   Financial Resource Strain:   . Difficulty of Paying Living Expenses: Not on file  Food Insecurity:   . Worried About Charity fundraiser in the Last Year: Not on file  . Ran Out of Food in the Last Year:  Not on file  Transportation Needs:   . Lack of Transportation (Medical): Not on file  . Lack of Transportation (Non-Medical): Not on file  Physical Activity:   . Days of Exercise per Week: Not on file  . Minutes of Exercise per Session: Not on file  Stress:   . Feeling of Stress : Not on file  Social Connections:   . Frequency of Communication with Friends and Family: Not on file  . Frequency of Social Gatherings with Friends and Family: Not on file  . Attends Religious Services: Not on file  . Active Member of Clubs or Organizations: Not on file  . Attends Archivist Meetings: Not on file  . Marital Status: Not on file  Intimate Partner Violence:   . Fear of Current or Ex-Partner: Not on file  . Emotionally Abused: Not on file  . Physically Abused: Not on file  . Sexually Abused: Not on file   Family History  Problem Relation Age of Onset  . Diabetes Mother     OBJECTIVE: Vitals:   11/14/19 1715 11/14/19 1718  BP: (!) 152/72   Pulse: 82   Resp: 19   Temp: 98.6 F (37 C)   TempSrc: Oral   SpO2: 90%   Weight:  163 lb (73.9 kg)  Height:  5' (1.524 m)    General appearance: alert; no distress Head: NCAT Lungs: normal respiratory effort Skin: warm and dry; area of dry skin over LT side, possible scarring from past shingles rash Psychological: alert and cooperative; normal mood and affect  ASSESSMENT & PLAN:  1. Post herpetic neuralgia     Meds ordered this encounter  Medications  . predniSONE (DELTASONE) 20 MG tablet    Sig: Take 1 tablet (20 mg total) by mouth 2 (two) times daily with a meal for 5 days.    Dispense:  10 tablet    Refill:  0    Order Specific Question:   Supervising Provider    Answer:   Raylene Everts [[id number]]  . dexamethasone (DECADRON) injection 10 mg    Rest and use ice/heat as needed for symptomatic relief Steroid shot given in office Prescribed prednisone for inflammation and pain Use OTC medications such as ibuprofen/  tylenol.   Follow up with PCP for recheck.  You may have nerve damage following shingles rash Return here or go to ER if you have any new or worsening symptoms (such as eye involvement, severe pain, or signs of secondary infection such as fever, chills, nausea, vomiting, discharge, redness or warmth over site of rash)  Reviewed expectations re: course of current medical issues. Questions answered. Outlined signs and  symptoms indicating need for more acute intervention. Patient verbalized understanding. After Visit Summary given.   Lestine Box, PA-C 11/14/19 1745

## 2019-11-14 NOTE — Discharge Instructions (Addendum)
Rest and use ice/heat as needed for symptomatic relief Steroid shot given in office Prescribed prednisone for inflammation and pain Use OTC medications such as ibuprofen/ tylenol.   Follow up with PCP for recheck.  You may have nerve damage following shingles rash Return here or go to ER if you have any new or worsening symptoms (such as eye involvement, severe pain, or signs of secondary infection such as fever, chills, nausea, vomiting, discharge, redness or warmth over site of rash)

## 2019-11-27 DIAGNOSIS — C8330 Diffuse large B-cell lymphoma, unspecified site: Secondary | ICD-10-CM | POA: Diagnosis not present

## 2019-11-27 DIAGNOSIS — D509 Iron deficiency anemia, unspecified: Secondary | ICD-10-CM | POA: Diagnosis not present

## 2019-11-27 DIAGNOSIS — Z23 Encounter for immunization: Secondary | ICD-10-CM | POA: Diagnosis not present

## 2019-11-27 DIAGNOSIS — I10 Essential (primary) hypertension: Secondary | ICD-10-CM | POA: Diagnosis not present

## 2019-12-24 ENCOUNTER — Other Ambulatory Visit: Payer: Self-pay

## 2019-12-24 ENCOUNTER — Inpatient Hospital Stay (HOSPITAL_COMMUNITY): Payer: Medicare Other | Attending: Hematology

## 2019-12-24 DIAGNOSIS — K5900 Constipation, unspecified: Secondary | ICD-10-CM | POA: Diagnosis not present

## 2019-12-24 DIAGNOSIS — E876 Hypokalemia: Secondary | ICD-10-CM | POA: Diagnosis not present

## 2019-12-24 DIAGNOSIS — I10 Essential (primary) hypertension: Secondary | ICD-10-CM | POA: Diagnosis not present

## 2019-12-24 DIAGNOSIS — Z7982 Long term (current) use of aspirin: Secondary | ICD-10-CM | POA: Diagnosis not present

## 2019-12-24 DIAGNOSIS — F418 Other specified anxiety disorders: Secondary | ICD-10-CM | POA: Insufficient documentation

## 2019-12-24 DIAGNOSIS — Z79899 Other long term (current) drug therapy: Secondary | ICD-10-CM | POA: Diagnosis not present

## 2019-12-24 DIAGNOSIS — D509 Iron deficiency anemia, unspecified: Secondary | ICD-10-CM | POA: Diagnosis present

## 2019-12-24 DIAGNOSIS — E7800 Pure hypercholesterolemia, unspecified: Secondary | ICD-10-CM | POA: Insufficient documentation

## 2019-12-24 DIAGNOSIS — Z8572 Personal history of non-Hodgkin lymphomas: Secondary | ICD-10-CM | POA: Diagnosis not present

## 2019-12-24 DIAGNOSIS — I739 Peripheral vascular disease, unspecified: Secondary | ICD-10-CM | POA: Insufficient documentation

## 2019-12-24 DIAGNOSIS — Z87891 Personal history of nicotine dependence: Secondary | ICD-10-CM | POA: Insufficient documentation

## 2019-12-24 DIAGNOSIS — E039 Hypothyroidism, unspecified: Secondary | ICD-10-CM | POA: Diagnosis not present

## 2019-12-24 DIAGNOSIS — D471 Chronic myeloproliferative disease: Secondary | ICD-10-CM

## 2019-12-24 LAB — COMPREHENSIVE METABOLIC PANEL
ALT: 9 U/L (ref 0–44)
AST: 14 U/L — ABNORMAL LOW (ref 15–41)
Albumin: 3.4 g/dL — ABNORMAL LOW (ref 3.5–5.0)
Alkaline Phosphatase: 53 U/L (ref 38–126)
Anion gap: 11 (ref 5–15)
BUN: 21 mg/dL (ref 8–23)
CO2: 25 mmol/L (ref 22–32)
Calcium: 9 mg/dL (ref 8.9–10.3)
Chloride: 101 mmol/L (ref 98–111)
Creatinine, Ser: 1.27 mg/dL — ABNORMAL HIGH (ref 0.44–1.00)
GFR, Estimated: 41 mL/min — ABNORMAL LOW (ref >60)
Glucose, Bld: 163 mg/dL — ABNORMAL HIGH (ref 70–99)
Potassium: 3.2 mmol/L — ABNORMAL LOW (ref 3.5–5.1)
Sodium: 137 mmol/L (ref 135–145)
Total Bilirubin: 0.8 mg/dL (ref 0.3–1.2)
Total Protein: 7 g/dL (ref 6.5–8.1)

## 2019-12-24 LAB — IRON AND TIBC
Iron: 78 ug/dL (ref 28–170)
Saturation Ratios: 28 % (ref 10.4–31.8)
TIBC: 283 ug/dL (ref 250–450)
UIBC: 205 ug/dL

## 2019-12-24 LAB — CBC WITH DIFFERENTIAL/PLATELET
Abs Immature Granulocytes: 0.01 10*3/uL (ref 0.00–0.07)
Basophils Absolute: 0 10*3/uL (ref 0.0–0.1)
Basophils Relative: 0 %
Eosinophils Absolute: 0 10*3/uL (ref 0.0–0.5)
Eosinophils Relative: 1 %
HCT: 37.8 % (ref 36.0–46.0)
Hemoglobin: 12.7 g/dL (ref 12.0–15.0)
Immature Granulocytes: 0 %
Lymphocytes Relative: 44 %
Lymphs Abs: 2.7 10*3/uL (ref 0.7–4.0)
MCH: 39 pg — ABNORMAL HIGH (ref 26.0–34.0)
MCHC: 33.6 g/dL (ref 30.0–36.0)
MCV: 116 fL — ABNORMAL HIGH (ref 80.0–100.0)
Monocytes Absolute: 0.7 10*3/uL (ref 0.1–1.0)
Monocytes Relative: 11 %
Neutro Abs: 2.7 10*3/uL (ref 1.7–7.7)
Neutrophils Relative %: 44 %
Platelets: 409 10*3/uL — ABNORMAL HIGH (ref 150–400)
RBC: 3.26 MIL/uL — ABNORMAL LOW (ref 3.87–5.11)
RDW: 14.5 % (ref 11.5–15.5)
WBC: 6.1 10*3/uL (ref 4.0–10.5)
nRBC: 0 % (ref 0.0–0.2)

## 2019-12-24 LAB — LACTATE DEHYDROGENASE: LDH: 127 U/L (ref 98–192)

## 2019-12-24 LAB — FERRITIN: Ferritin: 70 ng/mL (ref 11–307)

## 2019-12-31 ENCOUNTER — Inpatient Hospital Stay (HOSPITAL_BASED_OUTPATIENT_CLINIC_OR_DEPARTMENT_OTHER): Payer: Medicare Other | Admitting: Oncology

## 2019-12-31 ENCOUNTER — Encounter (HOSPITAL_COMMUNITY): Payer: Self-pay | Admitting: Oncology

## 2019-12-31 ENCOUNTER — Other Ambulatory Visit: Payer: Self-pay

## 2019-12-31 VITALS — BP 126/65 | HR 95 | Temp 96.8°F | Resp 18 | Wt 153.9 lb

## 2019-12-31 DIAGNOSIS — I10 Essential (primary) hypertension: Secondary | ICD-10-CM | POA: Diagnosis not present

## 2019-12-31 DIAGNOSIS — D471 Chronic myeloproliferative disease: Secondary | ICD-10-CM

## 2019-12-31 DIAGNOSIS — Z87891 Personal history of nicotine dependence: Secondary | ICD-10-CM | POA: Diagnosis not present

## 2019-12-31 DIAGNOSIS — E039 Hypothyroidism, unspecified: Secondary | ICD-10-CM | POA: Diagnosis not present

## 2019-12-31 DIAGNOSIS — Z79899 Other long term (current) drug therapy: Secondary | ICD-10-CM | POA: Diagnosis not present

## 2019-12-31 DIAGNOSIS — I739 Peripheral vascular disease, unspecified: Secondary | ICD-10-CM | POA: Diagnosis not present

## 2019-12-31 DIAGNOSIS — Z7982 Long term (current) use of aspirin: Secondary | ICD-10-CM | POA: Diagnosis not present

## 2019-12-31 DIAGNOSIS — K5900 Constipation, unspecified: Secondary | ICD-10-CM | POA: Diagnosis not present

## 2019-12-31 DIAGNOSIS — F418 Other specified anxiety disorders: Secondary | ICD-10-CM | POA: Diagnosis not present

## 2019-12-31 DIAGNOSIS — Z8572 Personal history of non-Hodgkin lymphomas: Secondary | ICD-10-CM | POA: Diagnosis not present

## 2019-12-31 DIAGNOSIS — E7800 Pure hypercholesterolemia, unspecified: Secondary | ICD-10-CM | POA: Diagnosis not present

## 2019-12-31 DIAGNOSIS — E876 Hypokalemia: Secondary | ICD-10-CM | POA: Diagnosis not present

## 2019-12-31 DIAGNOSIS — D509 Iron deficiency anemia, unspecified: Secondary | ICD-10-CM

## 2019-12-31 MED ORDER — POTASSIUM CHLORIDE CRYS ER 20 MEQ PO TBCR: 20.0000 meq | EXTENDED_RELEASE_TABLET | Freq: Every day | ORAL | 0 refills | Status: DC

## 2019-12-31 NOTE — Progress Notes (Signed)
Leslie Williamson, [name]   CLINIC:  Medical Oncology/Hematology  PCP:  Leslie Fire, MD [place] / [place] [address]  [phone]  REASON FOR VISIT:  Follow-up for myeloproliferative disorder and iron deficiency anemia  CURRENT THERAPY: Hydrea  INTERVAL HISTORY:  [name], a 86 y.o. female, returns for routine follow-up for her myeloproliferative disorder and iron deficiency anemia. Leslie Williamson was last evaluated on 08/09/2019.  At that visit she was doing well and denied any new concerns.  In the interim, she was evaluated at urgent care on 11/14/2019 for shingles.  She was given antivirals, steroid injection and prescription for prednisone.  Symptoms have improved.  Today, patient continues to do well.  She has mild constipation but continues to have daily bowel movements.  Her energy level has improved since her last iron infusion.  Her appetite is good.  Her weight is stable.  She is currently taking Hydrea 1 tablet daily.   REVIEW OF SYSTEMS:  Review of Systems  Constitutional: Negative for appetite change, fatigue, fever and unexpected weight change.  HENT:   Negative for nosebleeds, sore throat and trouble swallowing.   Eyes: Negative.   Respiratory: Negative.  Negative for cough, shortness of breath and wheezing.   Cardiovascular: Negative.  Negative for chest pain and leg swelling.  Gastrointestinal: Positive for constipation. Negative for abdominal pain, blood in stool, diarrhea, nausea and vomiting.  Endocrine: Negative.   Genitourinary: Negative.  Negative for bladder incontinence, hematuria and nocturia.   Musculoskeletal: Negative.  Negative for back pain and flank pain.  Skin: Negative.        Almost completely healed shingles on left torso  Neurological: Negative.  Negative for dizziness, headaches, light-headedness and numbness.  Hematological: Negative.   Psychiatric/Behavioral: Negative.   Negative for confusion. The patient is not nervous/anxious.     PAST MEDICAL/SURGICAL HISTORY:  Past Medical History:  Diagnosis Date  . Anxiety   . Complication of anesthesia    difficult to wake up  . Depression   . Hypercholesteremia   . Hypertension   . Hypokalemia   . Iron deficiency anemia   . Left hip pain   . NHL (non-Hodgkin's lymphoma) ([facility]) 12/17/2010  . Obesity   . Primary thrombocytosis ([facility]) 01/11/2006   Secondary to splenectomy    . PVD (peripheral vascular disease) ([facility])   . S/P splenectomy 01/01/2014  . 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 Vertigo, labyrinthine    Past Surgical History:  Procedure Laterality Date  . CATARACT EXTRACTION W/PHACO Left 11/03/2015   Procedure: CATARACT EXTRACTION PHACO AND INTRAOCULAR LENS PLACEMENT (IOC);  Surgeon: Tonny Branch, MD;  Location: [facility];  Service: Ophthalmology;  Laterality: Left;  CDE: 7.74  . CATARACT EXTRACTION W/PHACO Right 11/20/2015   Procedure: CATARACT EXTRACTION PHACO AND INTRAOCULAR LENS PLACEMENT ; CDE:  8.30;  Surgeon: Tonny Branch, MD;  Location: [facility];  Service: Ophthalmology;  Laterality: Right;  . PORT-A-CATH REMOVAL      SOCIAL HISTORY:  Social History   Socioeconomic History  . Marital status: Widowed    Spouse name: Not on file  . Number of children: Not on file  . Years of education: Not on file  . Highest education level: Not on file  Occupational History  . Not on file  Tobacco Use  . Smoking status: Former Research scientist (life sciences)  . Smokeless tobacco:  Never Used  Vaping Use  . Vaping Use: Never used  Substance and Sexual Activity  . Alcohol use: No  . Drug use: No  . Sexual activity: Never  Other Topics Concern  . Not on file  Social History Narrative  . Not on file   Social Determinants of Health   Financial Resource Strain: Low Risk   . Difficulty of Paying Living Expenses: Not hard at all  Food Insecurity: No Food  Insecurity  . Worried About Charity fundraiser in the Last Year: Never true  . Ran Out of Food in the Last Year: Never true  Transportation Needs: No Transportation Needs  . Lack of Transportation (Medical): No  . Lack of Transportation (Non-Medical): No  Physical Activity: Inactive  . Days of Exercise per Week: 0 days  . Minutes of Exercise per Session: 0 min  Stress: No Stress Concern Present  . Feeling of Stress : Not at all  Social Connections: Moderately Isolated  . Frequency of Communication with Friends and Family: More than three times a week  . Frequency of Social Gatherings with Friends and Family: More than three times a week  . Attends Religious Services: More than 4 times per year  . Active Member of Clubs or Organizations: No  . Attends Archivist Meetings: Never  . Marital Status: Widowed  Intimate Partner Violence: Not At Risk  . Fear of Current or Ex-Partner: No  . Emotionally Abused: No  . Physically Abused: No  . Sexually Abused: No    FAMILY HISTORY:  Family History  Problem Relation Age of Onset  . Diabetes Mother     CURRENT MEDICATIONS:  Current Outpatient Medications  Medication Sig Dispense Refill  . amLODipine (NORVASC) 2.5 MG tablet Take 2.5 mg by mouth daily.  2  . aspirin 81 MG tablet Take 81 mg by mouth daily.    Marland Kitchen atorvastatin (LIPITOR) 40 MG tablet Take 40 mg by mouth daily.  2  . benzonatate (TESSALON) 100 MG capsule Take 100 mg by mouth 3 (three) times daily as needed. (Patient not taking: Reported on 08/09/2019)    . cilostazol (PLETAL) 100 MG tablet Take 100 mg by mouth 2 (two) times daily.    . diclofenac (CATAFLAM) 50 MG tablet Take 1 tablet (50 mg total) by mouth 2 (two) times daily. 60 tablet 2  . gabapentin (NEURONTIN) 300 MG capsule Take by mouth.    . hydrochlorothiazide (HYDRODIURIL) 12.5 MG tablet Take 12.5 mg by mouth daily.    . hydroxyurea (HYDREA) 500 MG capsule TAKE 1 CAPSULE BY MOUTH DAILY. 90 capsule 0  .  ibuprofen (ADVIL,MOTRIN) 800 MG tablet Take 800 mg by mouth every 8 (eight) hours as needed for moderate pain.  (Patient not taking: Reported on 08/09/2019)  0  . loratadine (CLARITIN) 10 MG tablet Take 10 mg by mouth daily.    Marland Kitchen losartan (COZAAR) 100 MG tablet Take 100 mg by mouth daily.    . temazepam (RESTORIL) 15 MG capsule Take 1 capsule (15 mg total) by mouth at bedtime as needed for sleep. (Patient not taking: Reported on 08/09/2019) 30 capsule 0   No current facility-administered medications for this visit.    ALLERGIES:  Allergies  Allergen Reactions  . Penicillins Rash    PHYSICAL EXAM:  Performance status (ECOG): 0 - Asymptomatic  Vitals:   12/31/19 1349  BP: 126/65  Pulse: 95  Resp: 18  Temp: (!) 96.8 F (36 C)  SpO2: 100%  Wt Readings from Last 3 Encounters:  12/31/19 153 lb 14.1 oz (69.8 kg)  11/14/19 163 lb (73.9 kg)  08/09/19 161 lb 6.4 oz (73.2 kg)   Physical Exam Vitals reviewed.  Constitutional:      Appearance: Normal appearance.  Cardiovascular:     Rate and Rhythm: Normal rate and regular rhythm.     Heart sounds: Normal heart sounds.  Pulmonary:     Effort: Pulmonary effort is normal.     Breath sounds: Normal breath sounds.  Abdominal:     Palpations: Abdomen is soft. There is no mass.  Neurological:     Mental Status: She is alert and oriented to person, place, and time.  Psychiatric:        Mood and Affect: Mood normal.     LABORATORY DATA:  I have reviewed the labs as listed.  CBC Latest Ref Rng & Units 12/24/2019 08/01/2019 03/27/2019  WBC 4.0 - 10.5 K/uL 6.1 7.3 8.0  Hemoglobin 12.0 - 15.0 g/dL 12.7 12.1 12.2  Hematocrit 36 - 46 % 37.8 35.9(L) 37.1  Platelets 150 - 400 K/uL 409(H) 396 457(H)   CMP Latest Ref Rng & Units 12/24/2019 08/01/2019 03/27/2019  Glucose 70 - 99 mg/dL 163(H) 168(H) 157(H)  BUN 8 - 23 mg/dL 21 28(H) 26(H)  Creatinine 0.44 - 1.00 mg/dL 1.27(H) 1.30(H) 1.32(H)  Sodium 135 - 145 mmol/L 137 138 139  Potassium 3.5 -  5.1 mmol/L 3.2(L) 3.8 3.8  Chloride 98 - 111 mmol/L 101 105 105  CO2 22 - 32 mmol/L 25 24 25   Calcium 8.9 - 10.3 mg/dL 9.0 9.1 9.1  Total Protein 6.5 - 8.1 g/dL 7.0 7.4 7.1  Total Bilirubin 0.3 - 1.2 mg/dL 0.8 0.6 0.8  Alkaline Phos 38 - 126 U/L 53 58 60  AST 15 - 41 U/L 14(L) 14(L) 14(L)  ALT 0 - 44 U/L 9 11 12       Component Value Date/Time   RBC 3.26 (L) 12/24/2019 1108   MCV 116.0 (H) 12/24/2019 1108   MCH 39.0 (H) 12/24/2019 1108   MCHC 33.6 12/24/2019 1108   RDW 14.5 12/24/2019 1108   LYMPHSABS 2.7 12/24/2019 1108   MONOABS 0.7 12/24/2019 1108   EOSABS 0.0 12/24/2019 1108   BASOSABS 0.0 12/24/2019 1108   Lab Results  Component Value Date   TIBC 283 12/24/2019   TIBC 329 08/01/2019   TIBC 302 03/27/2019   FERRITIN 70 12/24/2019   FERRITIN 19 08/01/2019   FERRITIN 38 03/27/2019   IRONPCTSAT 28 12/24/2019   IRONPCTSAT 25 08/01/2019   IRONPCTSAT 33 (H) 03/27/2019    DIAGNOSTIC IMAGING:  I have independently reviewed the scans and discussed with the patient.   ASSESSMENT:  1.  JAK2 positive myeloproliferative disorder: -Major manifestation is thrombocytosis.  She also has previous history of splenectomy. -She is on 1 tablet of hydrea daily.   2.  Iron deficiency state: -Intermittent Feraheme infusions -Last given on 08/16/2019 and 08/24/2019  3.  Marginal zone lymphoma of the spleen: -Diagnosed in 1999, status post CHOP therapy x7 cycles followed by rituximab for 1 cycle. -Posttreatment CT scan showed complete response.    PLAN:  1.  JAK2 positive myeloproliferative disorder: -We reviewed her labs today.  Platelet count is 409.  Hematocrit is 37.8.  Hemoglobin 12.7.  White count is 6.1. -She will continue hydroxyurea 1 pill daily.  -Previous notes indicate she was supposed to take 2 pills Monday Wednesday and Friday and 1 pill the other days but patient  has no recollection of this.  She continues on 1 pill daily.  If platelet count continues to trend up, an  adjustment will likely need to be made. -We will plan to reevaluate her in 3 months.  2.  Iron deficiency state: -Hemoglobin 12.7.  Ferritin is 70.  Percent saturation is 28. -Received 2 doses of IV Feraheme on 08/16/2019 and 08/24/2019.  3.  Marginal zone lymphoma of the spleen: -No B symptoms at this time.  4.  Hypokalemia: -Potassium 3.2. -Likely secondary to hydrochlorothiazide -Not on oral supplementation per chart review. -We will send prescription for potassium supplements 20 mEq daily x10 days  Disposition: Return to clinic in 3 months for reevaluation and lab work (CBC, CMP, Iron panel, Ferritin)  Orders placed this encounter:  No orders of the defined types were placed in this encounter.  Greater than 50% was spent in counseling and coordination of care with this patient including but not limited to discussion of the relevant topics above (See A&P) including, but not limited to diagnosis and management of acute and chronic medical conditions.   Faythe Casa, NP 12/31/2019 2:36 PM  [place] [phone]

## 2019-12-31 NOTE — Patient Instructions (Signed)
[facility] Discharge Instructions  You saw Jenny Burns, NP, today.   Thank you for choosing [facility] at [facility] to provide your oncology and hematology care.  To afford each patient quality time with our provider, please arrive at least 15 minutes before your scheduled appointment time.   If you have a lab appointment with the Cancer Center please come in thru the Main Entrance and check in at the main information desk.  You need to re-schedule your appointment should you arrive 10 or more minutes late.  We strive to give you quality time with our providers, and arriving late affects you and other patients whose appointments are after yours.  Also, if you no show three or more times for appointments you may be dismissed from the clinic at the providers discretion.     Again, thank you for choosing [facility].  Our hope is that these requests will decrease the amount of time that you wait before being seen by our physicians.       _____________________________________________________________  Should you have questions after your visit to [facility], please contact our office at [phone] and follow the prompts.  Our office hours are 8:00 a.m. and 4:30 p.m. Monday - Friday.  Please note that voicemails left after 4:00 p.m. may not be returned until the following business day.  We are closed weekends and major holidays.  You do have access to a nurse 24-7, just call the main number to the clinic [phone] and do not press any options, hold on the line and a nurse will answer the phone.    For prescription refill requests, have your pharmacy contact our office and allow 72 hours.    Due to Covid, you will need to wear a mask upon entering the hospital. If you do not have a mask, a mask will be given to you at the Main Entrance upon arrival. For doctor visits, patients may have 1 support person age 18  or older with them. For treatment visits, patients can not have anyone with them due to social distancing guidelines and our immunocompromised population.     

## 2020-01-04 DIAGNOSIS — I10 Essential (primary) hypertension: Secondary | ICD-10-CM | POA: Diagnosis not present

## 2020-01-04 DIAGNOSIS — F329 Major depressive disorder, single episode, unspecified: Secondary | ICD-10-CM | POA: Diagnosis not present

## 2020-02-03 DIAGNOSIS — I10 Essential (primary) hypertension: Secondary | ICD-10-CM | POA: Diagnosis not present

## 2020-02-03 DIAGNOSIS — F329 Major depressive disorder, single episode, unspecified: Secondary | ICD-10-CM | POA: Diagnosis not present

## 2020-02-21 ENCOUNTER — Other Ambulatory Visit (HOSPITAL_COMMUNITY): Payer: Self-pay | Admitting: *Deleted

## 2020-02-21 DIAGNOSIS — D471 Chronic myeloproliferative disease: Secondary | ICD-10-CM

## 2020-02-21 MED ORDER — HYDROXYUREA 500 MG PO CAPS: 500.0000 mg | ORAL_CAPSULE | Freq: Every day | ORAL | 0 refills | Status: DC

## 2020-03-05 DIAGNOSIS — I10 Essential (primary) hypertension: Secondary | ICD-10-CM | POA: Diagnosis not present

## 2020-03-05 DIAGNOSIS — I739 Peripheral vascular disease, unspecified: Secondary | ICD-10-CM | POA: Diagnosis not present

## 2020-03-18 ENCOUNTER — Telehealth: Payer: Self-pay | Admitting: Emergency Medicine

## 2020-03-18 ENCOUNTER — Ambulatory Visit
Admission: EM | Admit: 2020-03-18 | Discharge: 2020-03-18 | Disposition: A | Discharge status: Home / Self Care | Payer: Medicare Other | Attending: Emergency Medicine | Admitting: Emergency Medicine

## 2020-03-18 ENCOUNTER — Other Ambulatory Visit: Payer: Self-pay

## 2020-03-18 ENCOUNTER — Encounter: Payer: Self-pay | Admitting: Emergency Medicine

## 2020-03-18 DIAGNOSIS — R059 Cough, unspecified: Secondary | ICD-10-CM | POA: Diagnosis not present

## 2020-03-18 MED ORDER — BENZONATATE 100 MG PO CAPS: 100.0000 mg | ORAL_CAPSULE | Freq: Three times a day (TID) | ORAL | 0 refills | Status: DC | PRN

## 2020-03-18 MED ORDER — GUAIFENESIN-CODEINE 100-6.3 MG/5ML PO SOLN
5.0000 mL | Freq: Two times a day (BID) | ORAL | 0 refills | Status: AC | PRN
Start: 2020-03-18 — End: 2020-03-28

## 2020-03-18 NOTE — Telephone Encounter (Addendum)
Patient called and requested a different cough medication to be prescribed.  Guafenesine with codeine was sent to pharmacy on file

## 2020-03-18 NOTE — ED Provider Notes (Signed)
[place]   [id number] 03/18/20 Arrival Time: 1203   CC: Cough  SUBJECTIVE: History from: patient.  Leslie Williamson is a 87 y.o. female who presented to the urgent care with a complaint of cough for the past few days.  Denies sick exposure to COVID, flu or strep.  Denies recent travel.  Has tried OTC medication without relief.  Denies alleviating or aggravating factors.  Denies previous symptoms in the past.   Denies fever, chills, fatigue, sinus pain, rhinorrhea, sore throat, SOB, wheezing, chest pain, nausea, changes in bowel or bladder habits.     ROS: As per HPI.  All other pertinent ROS negative.      Past Medical History:  Diagnosis Date  . Anxiety   . Complication of anesthesia    difficult to wake up  . Depression   . Hypercholesteremia   . Hypertension   . Hypokalemia   . Iron deficiency anemia   . Left hip pain   . NHL (non-Hodgkin's lymphoma) ([place]) 12/17/2010  . Obesity   . Primary thrombocytosis ([place]) 01/11/2006   Secondary to splenectomy    . PVD (peripheral vascular disease) ([place])   . S/P splenectomy 01/01/2014  . 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 Vertigo, labyrinthine    Past Surgical History:  Procedure Laterality Date  . CATARACT EXTRACTION W/PHACO Left 11/03/2015   Procedure: CATARACT EXTRACTION PHACO AND INTRAOCULAR LENS PLACEMENT (IOC);  Surgeon: Tonny Branch, MD;  Location: [facility];  Service: Ophthalmology;  Laterality: Left;  CDE: 7.74  . CATARACT EXTRACTION W/PHACO Right 11/20/2015   Procedure: CATARACT EXTRACTION PHACO AND INTRAOCULAR LENS PLACEMENT ; CDE:  8.30;  Surgeon: Tonny Branch, MD;  Location: [facility];  Service: Ophthalmology;  Laterality: Right;  . PORT-A-CATH REMOVAL     Allergies  Allergen Reactions  . Penicillins Rash   No current facility-administered medications on file prior to encounter.   Current Outpatient Medications on File Prior to  Encounter  Medication Sig Dispense Refill  . amLODipine (NORVASC) 2.5 MG tablet Take 2.5 mg by mouth daily.  2  . aspirin 81 MG tablet Take 81 mg by mouth daily.    Marland Kitchen atorvastatin (LIPITOR) 40 MG tablet Take 40 mg by mouth daily.  2  . benzonatate (TESSALON) 100 MG capsule Take 100 mg by mouth 3 (three) times daily as needed. (Patient not taking: Reported on 08/09/2019)    . cilostazol (PLETAL) 100 MG tablet Take 100 mg by mouth 2 (two) times daily.    . diclofenac (CATAFLAM) 50 MG tablet Take 1 tablet (50 mg total) by mouth 2 (two) times daily. 60 tablet 2  . gabapentin (NEURONTIN) 300 MG capsule Take by mouth.    . hydrochlorothiazide (HYDRODIURIL) 12.5 MG tablet Take 12.5 mg by mouth daily.    . hydroxyurea (HYDREA) 500 MG capsule Take 1 capsule (500 mg total) by mouth daily. May take with food to minimize GI side effects. 90 capsule 0  . ibuprofen (ADVIL,MOTRIN) 800 MG tablet Take 800 mg by mouth every 8 (eight) hours as needed for moderate pain.  (Patient not taking: Reported on 08/09/2019)  0  . loratadine (CLARITIN) 10 MG tablet Take 10 mg by mouth daily.    Marland Kitchen losartan (COZAAR) 100 MG tablet Take 100 mg by mouth daily.    . potassium chloride SA (KLOR-CON) 20 MEQ tablet Take 1 tablet (20 mEq total) by mouth daily. 90 tablet  0  . temazepam (RESTORIL) 15 MG capsule Take 1 capsule (15 mg total) by mouth at bedtime as needed for sleep. (Patient not taking: Reported on 08/09/2019) 30 capsule 0   Social History   Socioeconomic History  . Marital status: Widowed    Spouse name: Not on file  . Number of children: Not on file  . Years of education: Not on file  . Highest education level: Not on file  Occupational History  . Not on file  Tobacco Use  . Smoking status: Former Games developermoker  . Smokeless tobacco: Never Used  Vaping Use  . Vaping Use: Never used  Substance and Sexual Activity  . Alcohol use: No  . Drug use: No  . Sexual activity: Never  Other Topics Concern  . Not on file   Social History Narrative  . Not on file   Social Determinants of Health   Financial Resource Strain: Low Risk   . Difficulty of Paying Living Expenses: Not hard at all  Food Insecurity: No Food Insecurity  . Worried About Programme researcher, broadcasting/film/videounning Out of Food in the Last Year: Never true  . Ran Out of Food in the Last Year: Never true  Transportation Needs: No Transportation Needs  . Lack of Transportation (Medical): No  . Lack of Transportation (Non-Medical): No  Physical Activity: Inactive  . Days of Exercise per Week: 0 days  . Minutes of Exercise per Session: 0 min  Stress: No Stress Concern Present  . Feeling of Stress : Not at all  Social Connections: Moderately Isolated  . Frequency of Communication with Friends and Family: More than three times a week  . Frequency of Social Gatherings with Friends and Family: More than three times a week  . Attends Religious Services: More than 4 times per year  . Active Member of Clubs or Organizations: No  . Attends BankerClub or Organization Meetings: Never  . Marital Status: Widowed  Intimate Partner Violence: Not At Risk  . Fear of Current or Ex-Partner: No  . Emotionally Abused: No  . Physically Abused: No  . Sexually Abused: No   Family History  Problem Relation Age of Onset  . Diabetes Mother     OBJECTIVE:  Vitals:   03/18/20 1254  BP: (!) 155/74  Pulse: 90  Resp: 16  Temp: 98.9 F (37.2 C)  SpO2: 95%     General appearance: alert; appears fatigued, but nontoxic; speaking in full sentences and tolerating own secretions HEENT: NCAT; Ears: EACs clear, TMs pearly gray; Eyes: PERRL.  EOM grossly intact. Sinuses: nontender; Nose: nares patent without rhinorrhea, Throat: oropharynx clear, tonsils non erythematous or enlarged, uvula midline  Neck: supple without LAD Lungs: unlabored respirations, symmetrical air entry; cough: moderate; no respiratory distress; CTAB Heart: regular rate and rhythm.  Radial pulses 2+ symmetrical  bilaterally Skin: warm and dry Psychological: alert and cooperative; normal mood and affect  LABS:  No results found for this or any previous visit (from the past 24 hour(s)).   ASSESSMENT & PLAN:  1. Cough     No orders of the defined types were placed in this encounter.   Discharge Instructions  Get plenty of rest and push fluids Tessalon Perles prescribed for cough Use medications daily for symptom relief Use OTC medications like ibuprofen or tylenol as needed fever or pain Call or go to the ED if you have any new or worsening symptoms such as fever, worsening cough, shortness of breath, chest tightness, chest pain, turning blue, changes in  mental status, etc...   Reviewed expectations re: course of current medical issues. Questions answered. Outlined signs and symptoms indicating need for more acute intervention. Patient verbalized understanding. After Visit Summary given.         Emerson Monte, [place] 03/18/20 1345

## 2020-03-18 NOTE — Discharge Instructions (Addendum)
Get plenty of rest and push fluids Tessalon Perles prescribed for cough Use medications daily for symptom relief Use OTC medications like ibuprofen or tylenol as needed fever or pain Call or go to the ED if you have any new or worsening symptoms such as fever, worsening cough, shortness of breath, chest tightness, chest pain, turning blue, changes in mental status, etc..Marland Kitchen

## 2020-03-31 ENCOUNTER — Other Ambulatory Visit (HOSPITAL_COMMUNITY): Payer: Self-pay | Admitting: *Deleted

## 2020-03-31 DIAGNOSIS — D509 Iron deficiency anemia, unspecified: Secondary | ICD-10-CM

## 2020-04-01 ENCOUNTER — Inpatient Hospital Stay (HOSPITAL_COMMUNITY): Payer: Medicare Other | Attending: Hematology

## 2020-04-01 ENCOUNTER — Other Ambulatory Visit: Payer: Self-pay

## 2020-04-01 DIAGNOSIS — E7800 Pure hypercholesterolemia, unspecified: Secondary | ICD-10-CM | POA: Diagnosis not present

## 2020-04-01 DIAGNOSIS — D75839 Thrombocytosis, unspecified: Secondary | ICD-10-CM | POA: Diagnosis not present

## 2020-04-01 DIAGNOSIS — Z7982 Long term (current) use of aspirin: Secondary | ICD-10-CM | POA: Insufficient documentation

## 2020-04-01 DIAGNOSIS — I10 Essential (primary) hypertension: Secondary | ICD-10-CM | POA: Diagnosis not present

## 2020-04-01 DIAGNOSIS — E669 Obesity, unspecified: Secondary | ICD-10-CM | POA: Diagnosis not present

## 2020-04-01 DIAGNOSIS — D509 Iron deficiency anemia, unspecified: Secondary | ICD-10-CM | POA: Insufficient documentation

## 2020-04-01 DIAGNOSIS — Z87891 Personal history of nicotine dependence: Secondary | ICD-10-CM | POA: Diagnosis not present

## 2020-04-01 DIAGNOSIS — E876 Hypokalemia: Secondary | ICD-10-CM | POA: Diagnosis not present

## 2020-04-01 DIAGNOSIS — I739 Peripheral vascular disease, unspecified: Secondary | ICD-10-CM | POA: Diagnosis not present

## 2020-04-01 DIAGNOSIS — F418 Other specified anxiety disorders: Secondary | ICD-10-CM | POA: Diagnosis not present

## 2020-04-01 DIAGNOSIS — Z8572 Personal history of non-Hodgkin lymphomas: Secondary | ICD-10-CM | POA: Diagnosis not present

## 2020-04-01 DIAGNOSIS — Z79899 Other long term (current) drug therapy: Secondary | ICD-10-CM | POA: Insufficient documentation

## 2020-04-01 LAB — COMPREHENSIVE METABOLIC PANEL
ALT: 12 U/L (ref 0–44)
AST: 17 U/L (ref 15–41)
Albumin: 3.4 g/dL — ABNORMAL LOW (ref 3.5–5.0)
Alkaline Phosphatase: 55 U/L (ref 38–126)
Anion gap: 7 (ref 5–15)
BUN: 20 mg/dL (ref 8–23)
CO2: 24 mmol/L (ref 22–32)
Calcium: 9 mg/dL (ref 8.9–10.3)
Chloride: 105 mmol/L (ref 98–111)
Creatinine, Ser: 1.08 mg/dL — ABNORMAL HIGH (ref 0.44–1.00)
GFR, Estimated: 50 mL/min — ABNORMAL LOW (ref >60)
Glucose, Bld: 113 mg/dL — ABNORMAL HIGH (ref 70–99)
Potassium: 3.6 mmol/L (ref 3.5–5.1)
Sodium: 136 mmol/L (ref 135–145)
Total Bilirubin: 0.5 mg/dL (ref 0.3–1.2)
Total Protein: 7.9 g/dL (ref 6.5–8.1)

## 2020-04-01 LAB — CBC WITH DIFFERENTIAL/PLATELET
Abs Immature Granulocytes: 0.03 10*3/uL (ref 0.00–0.07)
Basophils Absolute: 0 10*3/uL (ref 0.0–0.1)
Basophils Relative: 0 %
Eosinophils Absolute: 0 10*3/uL (ref 0.0–0.5)
Eosinophils Relative: 0 %
HCT: 34.9 % — ABNORMAL LOW (ref 36.0–46.0)
Hemoglobin: 11.7 g/dL — ABNORMAL LOW (ref 12.0–15.0)
Immature Granulocytes: 0 %
Lymphocytes Relative: 38 %
Lymphs Abs: 3.2 10*3/uL (ref 0.7–4.0)
MCH: 39.4 pg — ABNORMAL HIGH (ref 26.0–34.0)
MCHC: 33.5 g/dL (ref 30.0–36.0)
MCV: 117.5 fL — ABNORMAL HIGH (ref 80.0–100.0)
Monocytes Absolute: 1 10*3/uL (ref 0.1–1.0)
Monocytes Relative: 12 %
Neutro Abs: 4.2 10*3/uL (ref 1.7–7.7)
Neutrophils Relative %: 50 %
Platelets: 383 10*3/uL (ref 150–400)
RBC: 2.97 MIL/uL — ABNORMAL LOW (ref 3.87–5.11)
RDW: 15.9 % — ABNORMAL HIGH (ref 11.5–15.5)
WBC: 8.6 10*3/uL (ref 4.0–10.5)
nRBC: 0.2 % (ref 0.0–0.2)

## 2020-04-03 ENCOUNTER — Other Ambulatory Visit (HOSPITAL_COMMUNITY): Payer: Self-pay | Admitting: *Deleted

## 2020-04-03 DIAGNOSIS — D509 Iron deficiency anemia, unspecified: Secondary | ICD-10-CM

## 2020-04-03 NOTE — Progress Notes (Signed)
We received notification that patient's labs hemolyzed, she will return tomorrow for redraw.

## 2020-04-04 ENCOUNTER — Inpatient Hospital Stay (HOSPITAL_COMMUNITY): Payer: Medicare Other

## 2020-04-04 ENCOUNTER — Other Ambulatory Visit: Payer: Self-pay

## 2020-04-04 DIAGNOSIS — D75839 Thrombocytosis, unspecified: Secondary | ICD-10-CM | POA: Diagnosis not present

## 2020-04-04 DIAGNOSIS — I10 Essential (primary) hypertension: Secondary | ICD-10-CM | POA: Diagnosis not present

## 2020-04-04 DIAGNOSIS — D509 Iron deficiency anemia, unspecified: Secondary | ICD-10-CM | POA: Diagnosis not present

## 2020-04-04 DIAGNOSIS — I739 Peripheral vascular disease, unspecified: Secondary | ICD-10-CM | POA: Diagnosis not present

## 2020-04-04 DIAGNOSIS — E669 Obesity, unspecified: Secondary | ICD-10-CM | POA: Diagnosis not present

## 2020-04-04 DIAGNOSIS — F418 Other specified anxiety disorders: Secondary | ICD-10-CM | POA: Diagnosis not present

## 2020-04-04 DIAGNOSIS — Z87891 Personal history of nicotine dependence: Secondary | ICD-10-CM | POA: Diagnosis not present

## 2020-04-04 DIAGNOSIS — E876 Hypokalemia: Secondary | ICD-10-CM | POA: Diagnosis not present

## 2020-04-04 DIAGNOSIS — Z8572 Personal history of non-Hodgkin lymphomas: Secondary | ICD-10-CM | POA: Diagnosis not present

## 2020-04-04 DIAGNOSIS — Z7982 Long term (current) use of aspirin: Secondary | ICD-10-CM | POA: Diagnosis not present

## 2020-04-04 DIAGNOSIS — Z79899 Other long term (current) drug therapy: Secondary | ICD-10-CM | POA: Diagnosis not present

## 2020-04-04 DIAGNOSIS — E7800 Pure hypercholesterolemia, unspecified: Secondary | ICD-10-CM | POA: Diagnosis not present

## 2020-04-04 LAB — FERRITIN: Ferritin: 55 ng/mL (ref 11–307)

## 2020-04-04 LAB — IRON AND TIBC
Iron: 66 ug/dL (ref 28–170)
Saturation Ratios: 25 % (ref 10.4–31.8)
TIBC: 261 ug/dL (ref 250–450)
UIBC: 195 ug/dL

## 2020-04-05 DIAGNOSIS — I10 Essential (primary) hypertension: Secondary | ICD-10-CM | POA: Diagnosis not present

## 2020-04-05 DIAGNOSIS — C8330 Diffuse large B-cell lymphoma, unspecified site: Secondary | ICD-10-CM | POA: Diagnosis not present

## 2020-04-08 ENCOUNTER — Inpatient Hospital Stay (HOSPITAL_COMMUNITY): Payer: Medicare Other | Admitting: Oncology

## 2020-04-08 ENCOUNTER — Other Ambulatory Visit: Payer: Self-pay

## 2020-04-08 VITALS — BP 127/74 | HR 93 | Temp 96.7°F | Resp 18 | Wt 153.9 lb

## 2020-04-08 DIAGNOSIS — F419 Anxiety disorder, unspecified: Secondary | ICD-10-CM | POA: Diagnosis not present

## 2020-04-08 DIAGNOSIS — D471 Chronic myeloproliferative disease: Secondary | ICD-10-CM | POA: Diagnosis not present

## 2020-04-08 DIAGNOSIS — D509 Iron deficiency anemia, unspecified: Secondary | ICD-10-CM | POA: Diagnosis not present

## 2020-04-08 MED ORDER — BUSPIRONE HCL 5 MG PO TABS
5.0000 mg | ORAL_TABLET | Freq: Two times a day (BID) | ORAL | 1 refills | Status: DC
Start: 2020-04-08 — End: 2021-08-06

## 2020-04-08 MED ORDER — FERROUS SULFATE 325 (65 FE) MG PO TBEC: 325.0000 mg | DELAYED_RELEASE_TABLET | Freq: Every day | ORAL | 3 refills | Status: DC

## 2020-04-08 NOTE — Progress Notes (Signed)
[place], [place] [address]   CLINIC:  Medical Oncology/Hematology  PCP:  Rosita Fire, MD [place] / [place] [address]  [phone]  REASON FOR VISIT:  Follow-up for myeloproliferative disorder and iron deficiency anemia  CURRENT THERAPY: Hydrea  INTERVAL HISTORY:  Leslie Williamson, a 87 y.o. female, returns for routine follow-up for her myeloproliferative disorder and iron deficiency anemia. Leslie Williamson was last evaluated on 12/31/2019.  At that visit she was doing well and denied any new concerns.  In the interim, she was seen at urgent care on 03/18/2020 for cough.  She was given a prescription for [employer] and tested for Covid.   Today, patient continues to do well.  She denies any new concerns.  Her cough has completely resolved.  Bowel movements are stable energy is stable.  Appetite is good.  Endorses some anxiety.  Currently not taking anything.  She is taking Hydrea 1 tablet daily.    REVIEW OF SYSTEMS:  Review of Systems  Constitutional: Negative for appetite change, fatigue, fever and unexpected weight change.  HENT:   Negative for nosebleeds, sore throat and trouble swallowing.   Eyes: Negative.   Respiratory: Negative.  Negative for cough, shortness of breath and wheezing.   Cardiovascular: Negative.  Negative for chest pain and leg swelling.  Gastrointestinal: Negative for abdominal pain, blood in stool, constipation, diarrhea, nausea and vomiting.  Endocrine: Negative.   Genitourinary: Negative.  Negative for bladder incontinence, hematuria and nocturia.   Musculoskeletal: Negative.  Negative for back pain and flank pain.  Skin: Negative.   Neurological: Negative.  Negative for dizziness, headaches, light-headedness and numbness.  Hematological: Negative.   Psychiatric/Behavioral: Negative for confusion. The patient is nervous/anxious.     PAST MEDICAL/SURGICAL HISTORY:  Past Medical History:   Diagnosis Date  . Anxiety   . Complication of anesthesia    difficult to wake up  . Depression   . Hypercholesteremia   . Hypertension   . Hypokalemia   . Iron deficiency anemia   . Left hip pain   . NHL (non-Hodgkin's lymphoma) ([facility]) 12/17/2010  . Obesity   . Primary thrombocytosis ([facility]) 01/11/2006   Secondary to splenectomy    . PVD (peripheral vascular disease) ([facility])   . S/P splenectomy 01/01/2014  . 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 Vertigo, labyrinthine    Past Surgical History:  Procedure Laterality Date  . CATARACT EXTRACTION W/PHACO Left 11/03/2015   Procedure: CATARACT EXTRACTION PHACO AND INTRAOCULAR LENS PLACEMENT (IOC);  Surgeon: Tonny Branch, MD;  Location: [facility];  Service: Ophthalmology;  Laterality: Left;  CDE: 7.74  . CATARACT EXTRACTION W/PHACO Right 11/20/2015   Procedure: CATARACT EXTRACTION PHACO AND INTRAOCULAR LENS PLACEMENT ; CDE:  8.30;  Surgeon: Tonny Branch, MD;  Location: [facility];  Service: Ophthalmology;  Laterality: Right;  . PORT-A-CATH REMOVAL      SOCIAL HISTORY:  Social History   Socioeconomic History  . Marital status: Widowed    Spouse name: Not on file  . Number of children: Not on file  . Years of education: Not on file  . Highest education level: Not on file  Occupational History  . Not on file  Tobacco Use  . Smoking status: Former Research scientist (life sciences)  . Smokeless tobacco: Never Used  Vaping Use  . Vaping Use: Never used  Substance and Sexual Activity  . Alcohol  use: No  . Drug use: No  . Sexual activity: Never  Other Topics Concern  . Not on file  Social History Narrative  . Not on file   Social Determinants of Health   Financial Resource Strain: Low Risk   . Difficulty of Paying Living Expenses: Not hard at all  Food Insecurity: No Food Insecurity  . Worried About Charity fundraiser in the Last Year: Never true  . Ran Out of Food in the Last Year: Never  true  Transportation Needs: No Transportation Needs  . Lack of Transportation (Medical): No  . Lack of Transportation (Non-Medical): No  Physical Activity: Inactive  . Days of Exercise per Week: 0 days  . Minutes of Exercise per Session: 0 min  Stress: No Stress Concern Present  . Feeling of Stress : Not at all  Social Connections: Moderately Isolated  . Frequency of Communication with Friends and Family: More than three times a week  . Frequency of Social Gatherings with Friends and Family: More than three times a week  . Attends Religious Services: More than 4 times per year  . Active Member of Clubs or Organizations: No  . Attends Archivist Meetings: Never  . Marital Status: Widowed  Intimate Partner Violence: Not At Risk  . Fear of Current or Ex-Partner: No  . Emotionally Abused: No  . Physically Abused: No  . Sexually Abused: No    FAMILY HISTORY:  Family History  Problem Relation Age of Onset  . Diabetes Mother     CURRENT MEDICATIONS:  Current Outpatient Medications  Medication Sig Dispense Refill  . amLODipine (NORVASC) 2.5 MG tablet Take 2.5 mg by mouth daily.  2  . aspirin 81 MG tablet Take 81 mg by mouth daily.    Marland Kitchen atorvastatin (LIPITOR) 40 MG tablet Take 40 mg by mouth daily.  2  . busPIRone (BUSPAR) 5 MG tablet Take 1 tablet (5 mg total) by mouth 2 (two) times daily. 90 tablet 1  . cilostazol (PLETAL) 100 MG tablet Take 100 mg by mouth 2 (two) times daily.    . diclofenac (CATAFLAM) 50 MG tablet Take 1 tablet (50 mg total) by mouth 2 (two) times daily. 60 tablet 2  . ferrous sulfate 325 (65 FE) MG EC tablet Take 1 tablet (325 mg total) by mouth daily with breakfast. 90 tablet 3  . gabapentin (NEURONTIN) 300 MG capsule Take by mouth.    . hydrochlorothiazide (HYDRODIURIL) 12.5 MG tablet Take 12.5 mg by mouth daily.    . hydroxyurea (HYDREA) 500 MG capsule Take 1 capsule (500 mg total) by mouth daily. May take with food to minimize GI side effects. 90  capsule 0  . loratadine (CLARITIN) 10 MG tablet Take 10 mg by mouth daily.    Marland Kitchen losartan (COZAAR) 100 MG tablet Take 100 mg by mouth daily.    . potassium chloride SA (KLOR-CON) 20 MEQ tablet Take 1 tablet (20 mEq total) by mouth daily. 90 tablet 0  . benzonatate (TESSALON) 100 MG capsule Take 1 capsule (100 mg total) by mouth 3 (three) times daily as needed for cough. (Patient not taking: Reported on 04/08/2020) 30 capsule 0  . ibuprofen (ADVIL,MOTRIN) 800 MG tablet Take 800 mg by mouth every 8 (eight) hours as needed for moderate pain.  (Patient not taking: Reported on 04/08/2020)  0  . temazepam (RESTORIL) 15 MG capsule Take 1 capsule (15 mg total) by mouth at bedtime as needed for sleep. (Patient not taking:  Reported on 04/08/2020) 30 capsule 0   No current facility-administered medications for this visit.    ALLERGIES:  Allergies  Allergen Reactions  . Penicillins Rash    PHYSICAL EXAM:  Performance status (ECOG): 0 - Asymptomatic  Vitals:   04/08/20 1127  BP: 127/74  Pulse: 93  Resp: 18  Temp: (!) 96.7 F (35.9 C)  SpO2: 92%   Wt Readings from Last 3 Encounters:  04/08/20 153 lb 14.1 oz (69.8 kg)  12/31/19 153 lb 14.1 oz (69.8 kg)  11/14/19 163 lb (73.9 kg)   Physical Exam Vitals reviewed.  Constitutional:      Appearance: Normal appearance.  Cardiovascular:     Rate and Rhythm: Normal rate and regular rhythm.     Heart sounds: Normal heart sounds.  Pulmonary:     Effort: Pulmonary effort is normal.     Breath sounds: Normal breath sounds.  Abdominal:     Palpations: Abdomen is soft. There is no mass.  Neurological:     Mental Status: She is alert and oriented to person, place, and time.  Psychiatric:        Mood and Affect: Mood normal.     LABORATORY DATA:  I have reviewed the labs as listed.  CBC Latest Ref Rng & Units 04/01/2020 12/24/2019 08/01/2019  WBC 4.0 - 10.5 K/uL 8.6 6.1 7.3  Hemoglobin 12.0 - 15.0 g/dL 11.7(L) 12.7 12.1  Hematocrit 36.0 - 46.0 %  34.9(L) 37.8 35.9(L)  Platelets 150 - 400 K/uL 383 409(H) 396   CMP Latest Ref Rng & Units 04/01/2020 12/24/2019 08/01/2019  Glucose 70 - 99 mg/dL 113(H) 163(H) 168(H)  BUN 8 - 23 mg/dL 20 21 28(H)  Creatinine 0.44 - 1.00 mg/dL 1.08(H) 1.27(H) 1.30(H)  Sodium 135 - 145 mmol/L 136 137 138  Potassium 3.5 - 5.1 mmol/L 3.6 3.2(L) 3.8  Chloride 98 - 111 mmol/L 105 101 105  CO2 22 - 32 mmol/L 24 25 24   Calcium 8.9 - 10.3 mg/dL 9.0 9.0 9.1  Total Protein 6.5 - 8.1 g/dL 7.9 7.0 7.4  Total Bilirubin 0.3 - 1.2 mg/dL 0.5 0.8 0.6  Alkaline Phos 38 - 126 U/L 55 53 58  AST 15 - 41 U/L 17 14(L) 14(L)  ALT 0 - 44 U/L 12 9 11       Component Value Date/Time   RBC 2.97 (L) 04/01/2020 1356   MCV 117.5 (H) 04/01/2020 1356   MCH 39.4 (H) 04/01/2020 1356   MCHC 33.5 04/01/2020 1356   RDW 15.9 (H) 04/01/2020 1356   LYMPHSABS 3.2 04/01/2020 1356   MONOABS 1.0 04/01/2020 1356   EOSABS 0.0 04/01/2020 1356   BASOSABS 0.0 04/01/2020 1356   Lab Results  Component Value Date   TIBC 261 04/04/2020   TIBC 283 12/24/2019   TIBC 329 08/01/2019   FERRITIN 55 04/04/2020   FERRITIN 70 12/24/2019   FERRITIN 19 08/01/2019   IRONPCTSAT 25 04/04/2020   IRONPCTSAT 28 12/24/2019   IRONPCTSAT 25 08/01/2019    DIAGNOSTIC IMAGING:  I have independently reviewed the scans and discussed with the patient.   ASSESSMENT:  1.  JAK2 positive myeloproliferative disorder: -Major manifestation is thrombocytosis.  She also has previous history of splenectomy. -She is on 1 tablet of hydrea daily.   2.  Iron deficiency state: -Intermittent Feraheme infusions -Last given on 08/16/2019 and 08/24/2019  3.  Marginal zone lymphoma of the spleen: -Diagnosed in 1999, status post CHOP therapy x7 cycles followed by rituximab for 1 cycle. -Posttreatment CT  scan showed complete response.    PLAN:  1.  JAK2 positive myeloproliferative disorder: -We reviewed her labs today.  -Platelet count is 383 hemoglobin 11.7, hematocrit 34.9  and white count is 8.6. -She will continue hydroxyurea 1 pill daily. -We will plan to reevaluate her in 4 months.  2.  Iron deficiency state: -Labs from 04/04/2020 show a ferritin of 55, iron saturations 25% and a hemoglobin of 11.7. -Received 2 doses of IV Feraheme on 08/16/2019 and 08/24/2019. -She is not interested in any additional IV iron. -Recommend starting oral iron tablets given her iron/blood counts are trending down.  We reviewed side effects from oral iron tablets including abdominal pain, bloating or constipation.  Patient will call clinic if she has side effects.  New prescription for iron 325 daily sent to her pharmacy.  Plan is to increase to twice daily if she is able to tolerate.  3.  Marginal zone lymphoma of the spleen: -No B symptoms at this time.  4.  Hypokalemia: -Labs from 04/01/2020 show a potassium level of 3.6  5.  Anxiety: -Feels anxious.  -Would like to try something low-dose for anxiety. -Would recommend BuSpar 5 mg twice daily.  -New prescription sent to pharmacy.  Disposition: We will follow up in about 2 months to see if she is tolerating iron tablets and BuSpar. Return to clinic in 4 months for reevaluation and lab work (CBC, CMP, Iron panel, Ferritin).   Orders placed this encounter:  No orders of the defined types were placed in this encounter.  Greater than 50% was spent in counseling and coordination of care with this patient including but not limited to discussion of the relevant topics above (See A&P) including, but not limited to diagnosis and management of acute and chronic medical conditions.   Faythe Casa, NP 04/08/2020 12:00 PM  [place] [phone]

## 2020-05-03 DIAGNOSIS — I10 Essential (primary) hypertension: Secondary | ICD-10-CM | POA: Diagnosis not present

## 2020-05-03 DIAGNOSIS — C8580 Other specified types of non-Hodgkin lymphoma, unspecified site: Secondary | ICD-10-CM | POA: Diagnosis not present

## 2020-05-05 ENCOUNTER — Other Ambulatory Visit (HOSPITAL_COMMUNITY): Payer: Self-pay | Admitting: Hematology

## 2020-05-05 DIAGNOSIS — D471 Chronic myeloproliferative disease: Secondary | ICD-10-CM

## 2020-05-26 ENCOUNTER — Other Ambulatory Visit (HOSPITAL_COMMUNITY): Payer: Self-pay | Admitting: Physician Assistant

## 2020-05-26 ENCOUNTER — Telehealth (HOSPITAL_COMMUNITY): Payer: Self-pay | Admitting: Surgery

## 2020-05-26 DIAGNOSIS — D509 Iron deficiency anemia, unspecified: Secondary | ICD-10-CM

## 2020-05-26 NOTE — Telephone Encounter (Signed)
Pt called in stating that was cold all the time, and that she has no energy.  She has been taking OTC iron tablets daily since her last visit with no improvement in her symptoms.  Pt asked if she could be seen to have her labs checked and possibly receive an iron infusion.  I notified [name], PA, who agreed to see the pt this week as a split visit with Dr. Delton Coombes.  Danielle called the pt to schedule her lab appointment and her office visit.

## 2020-05-29 ENCOUNTER — Other Ambulatory Visit: Payer: Self-pay

## 2020-05-29 ENCOUNTER — Inpatient Hospital Stay (HOSPITAL_COMMUNITY): Payer: Medicare Other | Attending: Hematology

## 2020-05-29 DIAGNOSIS — I10 Essential (primary) hypertension: Secondary | ICD-10-CM | POA: Insufficient documentation

## 2020-05-29 DIAGNOSIS — D473 Essential (hemorrhagic) thrombocythemia: Secondary | ICD-10-CM | POA: Diagnosis not present

## 2020-05-29 DIAGNOSIS — Z7982 Long term (current) use of aspirin: Secondary | ICD-10-CM | POA: Diagnosis not present

## 2020-05-29 DIAGNOSIS — E669 Obesity, unspecified: Secondary | ICD-10-CM | POA: Diagnosis not present

## 2020-05-29 DIAGNOSIS — Z79899 Other long term (current) drug therapy: Secondary | ICD-10-CM | POA: Insufficient documentation

## 2020-05-29 DIAGNOSIS — Z87891 Personal history of nicotine dependence: Secondary | ICD-10-CM | POA: Diagnosis not present

## 2020-05-29 DIAGNOSIS — D509 Iron deficiency anemia, unspecified: Secondary | ICD-10-CM | POA: Diagnosis not present

## 2020-05-29 DIAGNOSIS — E7800 Pure hypercholesterolemia, unspecified: Secondary | ICD-10-CM | POA: Insufficient documentation

## 2020-05-29 DIAGNOSIS — D472 Monoclonal gammopathy: Secondary | ICD-10-CM | POA: Diagnosis not present

## 2020-05-29 DIAGNOSIS — F419 Anxiety disorder, unspecified: Secondary | ICD-10-CM | POA: Diagnosis not present

## 2020-05-29 LAB — CBC WITH DIFFERENTIAL/PLATELET
Abs Immature Granulocytes: 0.03 10*3/uL (ref 0.00–0.07)
Basophils Absolute: 0 10*3/uL (ref 0.0–0.1)
Basophils Relative: 0 %
Eosinophils Absolute: 0 10*3/uL (ref 0.0–0.5)
Eosinophils Relative: 0 %
HCT: 33.6 % — ABNORMAL LOW (ref 36.0–46.0)
Hemoglobin: 11.2 g/dL — ABNORMAL LOW (ref 12.0–15.0)
Immature Granulocytes: 0 %
Lymphocytes Relative: 44 %
Lymphs Abs: 3.1 10*3/uL (ref 0.7–4.0)
MCH: 38.9 pg — ABNORMAL HIGH (ref 26.0–34.0)
MCHC: 33.3 g/dL (ref 30.0–36.0)
MCV: 116.7 fL — ABNORMAL HIGH (ref 80.0–100.0)
Monocytes Absolute: 0.5 10*3/uL (ref 0.1–1.0)
Monocytes Relative: 7 %
Neutro Abs: 3.4 10*3/uL (ref 1.7–7.7)
Neutrophils Relative %: 49 %
Platelets: 308 10*3/uL (ref 150–400)
RBC: 2.88 MIL/uL — ABNORMAL LOW (ref 3.87–5.11)
RDW: 15.7 % — ABNORMAL HIGH (ref 11.5–15.5)
WBC: 7.1 10*3/uL (ref 4.0–10.5)
nRBC: 0 % (ref 0.0–0.2)

## 2020-05-29 LAB — FERRITIN: Ferritin: 38 ng/mL (ref 11–307)

## 2020-05-29 LAB — IRON AND TIBC
Iron: 58 ug/dL (ref 28–170)
Saturation Ratios: 21 % (ref 10.4–31.8)
TIBC: 274 ug/dL (ref 250–450)
UIBC: 216 ug/dL

## 2020-06-03 DIAGNOSIS — D509 Iron deficiency anemia, unspecified: Secondary | ICD-10-CM | POA: Diagnosis not present

## 2020-06-03 DIAGNOSIS — I10 Essential (primary) hypertension: Secondary | ICD-10-CM | POA: Diagnosis not present

## 2020-06-03 DIAGNOSIS — R600 Localized edema: Secondary | ICD-10-CM | POA: Diagnosis not present

## 2020-06-03 DIAGNOSIS — C8580 Other specified types of non-Hodgkin lymphoma, unspecified site: Secondary | ICD-10-CM | POA: Diagnosis not present

## 2020-06-05 ENCOUNTER — Inpatient Hospital Stay (HOSPITAL_BASED_OUTPATIENT_CLINIC_OR_DEPARTMENT_OTHER): Payer: Medicare Other | Admitting: Hematology

## 2020-06-05 ENCOUNTER — Other Ambulatory Visit: Payer: Self-pay

## 2020-06-05 ENCOUNTER — Inpatient Hospital Stay (HOSPITAL_COMMUNITY): Payer: Medicare Other

## 2020-06-05 VITALS — BP 141/64 | HR 81 | Temp 97.0°F | Resp 18 | Wt 158.8 lb

## 2020-06-05 DIAGNOSIS — D471 Chronic myeloproliferative disease: Secondary | ICD-10-CM

## 2020-06-05 DIAGNOSIS — D509 Iron deficiency anemia, unspecified: Secondary | ICD-10-CM

## 2020-06-05 DIAGNOSIS — Z79899 Other long term (current) drug therapy: Secondary | ICD-10-CM | POA: Diagnosis not present

## 2020-06-05 DIAGNOSIS — E669 Obesity, unspecified: Secondary | ICD-10-CM | POA: Diagnosis not present

## 2020-06-05 DIAGNOSIS — D472 Monoclonal gammopathy: Secondary | ICD-10-CM | POA: Diagnosis not present

## 2020-06-05 DIAGNOSIS — D473 Essential (hemorrhagic) thrombocythemia: Secondary | ICD-10-CM | POA: Diagnosis not present

## 2020-06-05 DIAGNOSIS — E7800 Pure hypercholesterolemia, unspecified: Secondary | ICD-10-CM | POA: Diagnosis not present

## 2020-06-05 DIAGNOSIS — F419 Anxiety disorder, unspecified: Secondary | ICD-10-CM | POA: Diagnosis not present

## 2020-06-05 DIAGNOSIS — Z87891 Personal history of nicotine dependence: Secondary | ICD-10-CM | POA: Diagnosis not present

## 2020-06-05 DIAGNOSIS — Z7982 Long term (current) use of aspirin: Secondary | ICD-10-CM | POA: Diagnosis not present

## 2020-06-05 DIAGNOSIS — I10 Essential (primary) hypertension: Secondary | ICD-10-CM | POA: Diagnosis not present

## 2020-06-05 LAB — RETICULOCYTES
Immature Retic Fract: 23.9 % — ABNORMAL HIGH (ref 2.3–15.9)
RBC.: 2.89 MIL/uL — ABNORMAL LOW (ref 3.87–5.11)
Retic Count, Absolute: 58.1 10*3/uL (ref 19.0–186.0)
Retic Ct Pct: 2 % (ref 0.4–3.1)

## 2020-06-05 LAB — D-DIMER, QUANTITATIVE: D-Dimer, Quant: 1.01 ug/mL-FEU — ABNORMAL HIGH (ref 0.00–0.50)

## 2020-06-05 NOTE — Patient Instructions (Addendum)
[place] at [facility] Discharge Instructions  You were seen today by Dr. Delton Coombes and [name] PA-C for your elevated platelets and iron deficiencyanemia.  Your iron levels continued to drop - the iron pill is not working, so you will need to have IV iron instead.  Your platelet count looks very good - keep taking the Hydrea every day!  LABS:  - Labs today (D-Dimer,  Light chains, IFE, SPEP, reticulocytes, erythropoietin) - Stool cards - wait 5 days after stopping your iron before checking stool samples, and then return completed stool cards to the lab. - Labs in 3 month (CBC, CMP, LDH, iron/TIBC, ferritin)  OTHER TESTS: Ultrasound of your left leg to look for a blood clot  MEDICATIONS: Stop taking iron pill.  We will schedule you for IV iron (Venofer) for 3 doses.  Keep taking your hydrea and aspirin every day.  FOLLOW-UP APPOINTMENT: Phone visit after the ultrasound of your leg to discuss results.  Office visit in 3 months.   Thank you for choosing [place] at [facility] to provide your oncology and hematology care.  To afford each patient quality time with our provider, please arrive at least 15 minutes before your scheduled appointment time.   If you have a lab appointment with the [facility] please come in thru the Main Entrance and check in at the main information desk.  You need to re-schedule your appointment should you arrive 10 or more minutes late.  We strive to give you quality time with our providers, and arriving late affects you and other patients whose appointments are after yours.  Also, if you no show three or more times for appointments you may be dismissed from the clinic at the providers discretion.     Again, thank you for choosing [facility].  Our hope is that these requests will decrease the amount of time that you wait before being seen by our physicians.        _____________________________________________________________  Should you have questions after your visit to [facility], please contact our office at [phone] and follow the prompts.  Our office hours are 8:00 a.m. and 4:30 p.m. Monday - Friday.  Please note that voicemails left after 4:00 p.m. may not be returned until the following business day.  We are closed weekends and major holidays.  You do have access to a nurse 24-7, just call the main number to the clinic [phone] and do not press any options, hold on the line and a nurse will answer the phone.    For prescription refill requests, have your pharmacy contact our office and allow 72 hours.    Due to Covid, you will need to wear a mask upon entering the hospital. If you do not have a mask, a mask will be given to you at the Main Entrance upon arrival. For doctor visits, patients may have 1 support person age 25 or older with them. For treatment visits, patients can not have anyone with them due to social distancing guidelines and our immunocompromised population.

## 2020-06-05 NOTE — Progress Notes (Signed)
Orders received for venofer dosing as:  300 mg IVPB x 2 then last dose to be 400 mg IVPB.  T.OEugene Garnet Pennington,PA/Hartman Minahan Ronnald Ramp, PharmD

## 2020-06-05 NOTE — Progress Notes (Signed)
Patient reports taking the hydrea daily and has not experienced any adverse side effects.

## 2020-06-05 NOTE — Progress Notes (Signed)
[place], [place] [address]   CLINIC:  Medical Oncology/Hematology  PCP:  Rosita Fire, MD [place] [address] [phone]   REASON FOR VISIT:  Follow-up for iron deficiency anemia and JAK2 positive myeloproliferative disorder with thrombocytosis  CURRENT THERAPY: Hydrea, oral iron, intermittent IV iron transfusions  INTERVAL HISTORY:  Leslie Williamson 87 y.o. female returns for routine follow-up of her JAK2 positive essential thrombocytosis and iron deficiency anemia.  Last seen by NP Faythe Casa on 04/08/2020.  Her last IV Feraheme infusions were  08/16/2019 and 08/24/2019.  [name] was initially scheduled for follow-up next month, but called the clinic and asked for an earlier appointment because she was having vertigo and wondered if her iron could be causing this.  She was started on daily 325 mg ferrous sulfate at her last appointment however her ferritin has continued to trend downwards.  She also reports that about 10 days ago, she had onset of unilateral left lower extremeity edema.  She reports that she went to bed feeling normal, and woke up with her lef leg feeling "swollen and tight."  This has slowly improved over the past week, but has not fully resolved.  She denies recent chest pain on exertion, shortness of breath on minimal exertion, pre-syncopal episodes, or palpitations. She had not noticed any recent bleeding such as epistaxis, hematuria, hematochezia, or melena.  She did note dark stools, but only when taking iron supplement. The patient denies over the counter NSAID ingestion. She is taking 81 mg Aspirin daily, but is not on any other antiplatelets agents. She is not taking any PPI or acid-blocking medications. She denies any pica and eats a variety of diet. She never donated blood or received blood transfusion She had a colonoscopy many years ago, but does not remember the results.  She cannot  recall ever having an EGD.   No erythromelalgias, aquagenic pruritis, or changes in finger/toe coloration.  No new neurologic symptoms such as new-onset hearing loss, blurred vision, headache, peripheral neuropathy, or dizziness.    She denies B-symptoms such as fever, chills, weight loss, and night sweats.  No new masses or lymphadenopathy per her report. She denies fatigue.    At her last visit, the patient had complained of anxiety, started on BuSpar 5 mg twice daily at that time.  She reports that it has been helping her anxiety, but she only takes it as needed.  She has 100% energy and 100% appetite. She endorses that she is maintaining a stable weight.   REVIEW OF SYSTEMS:  Review of Systems  Constitutional: Negative for appetite change, chills, diaphoresis, fatigue, fever and unexpected weight change.  HENT:   Negative for lump/mass and nosebleeds.   Eyes: Negative for eye problems.  Respiratory: Negative for cough, hemoptysis and shortness of breath.   Cardiovascular: Positive for leg swelling (Left leg). Negative for chest pain and palpitations.  Gastrointestinal: Negative for abdominal pain, blood in stool, constipation, diarrhea, nausea and vomiting.  Genitourinary: Negative for hematuria.   Skin: Negative for itching and wound.  Neurological: Positive for dizziness (Positional vertigo). Negative for headaches and light-headedness.  Hematological: Does not bruise/bleed easily.      PAST MEDICAL/SURGICAL HISTORY:  Past Medical History:  Diagnosis Date  . Anxiety   . Complication of anesthesia    difficult to wake up  . Depression   . Hypercholesteremia   . Hypertension   . Hypokalemia   . Iron deficiency  anemia   . Left hip pain   . NHL (non-Hodgkin's lymphoma) ([facility]) 12/17/2010  . Obesity   . Primary thrombocytosis ([facility]) 01/11/2006   Secondary to splenectomy    . PVD (peripheral vascular disease) ([facility])   . S/P splenectomy 01/01/2014  . 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 Vertigo, labyrinthine    Past Surgical History:  Procedure Laterality Date  . CATARACT EXTRACTION W/PHACO Left 11/03/2015   Procedure: CATARACT EXTRACTION PHACO AND INTRAOCULAR LENS PLACEMENT (IOC);  Surgeon: Tonny Branch, MD;  Location: [facility];  Service: Ophthalmology;  Laterality: Left;  CDE: 7.74  . CATARACT EXTRACTION W/PHACO Right 11/20/2015   Procedure: CATARACT EXTRACTION PHACO AND INTRAOCULAR LENS PLACEMENT ; CDE:  8.30;  Surgeon: Tonny Branch, MD;  Location: [facility];  Service: Ophthalmology;  Laterality: Right;  . PORT-A-CATH REMOVAL       SOCIAL HISTORY:  Social History   Socioeconomic History  . Marital status: Widowed    Spouse name: Not on file  . Number of children: Not on file  . Years of education: Not on file  . Highest education level: Not on file  Occupational History  . Not on file  Tobacco Use  . Smoking status: Former Research scientist (life sciences)  . Smokeless tobacco: Never Used  Vaping Use  . Vaping Use: Never used  Substance and Sexual Activity  . Alcohol use: No  . Drug use: No  . Sexual activity: Never  Other Topics Concern  . Not on file  Social History Narrative  . Not on file   Social Determinants of Health   Financial Resource Strain: Low Risk   . Difficulty of Paying Living Expenses: Not hard at all  Food Insecurity: No Food Insecurity  . Worried About Charity fundraiser in the Last Year: Never true  . Ran Out of Food in the Last Year: Never true  Transportation Needs: No Transportation Needs  . Lack of Transportation (Medical): No  . Lack of Transportation (Non-Medical): No  Physical Activity: Inactive  . Days of Exercise per Week: 0 days  . Minutes of Exercise per Session: 0 min  Stress: No Stress Concern Present  . Feeling of Stress : Not at all  Social Connections: Moderately Isolated  . Frequency of Communication with Friends and Family: More than three times a week   . Frequency of Social Gatherings with Friends and Family: More than three times a week  . Attends Religious Services: More than 4 times per year  . Active Member of Clubs or Organizations: No  . Attends Archivist Meetings: Never  . Marital Status: Widowed  Intimate Partner Violence: Not At Risk  . Fear of Current or Ex-Partner: No  . Emotionally Abused: No  . Physically Abused: No  . Sexually Abused: No    FAMILY HISTORY:  Family History  Problem Relation Age of Onset  . Diabetes Mother     CURRENT MEDICATIONS:  Outpatient Encounter Medications as of 06/05/2020  Medication Sig Note  . amLODipine (NORVASC) 2.5 MG tablet Take 2.5 mg by mouth daily. 01/10/2015: Received from: External Pharmacy Received Sig:   . aspirin 81 MG tablet Take 81 mg by mouth daily.   Marland Kitchen atorvastatin (LIPITOR) 40 MG tablet Take 40 mg by mouth daily. 01/10/2015: Received from: External Pharmacy Received Sig:   . benzonatate (TESSALON) 100 MG capsule Take 1 capsule (100 mg total) by mouth 3 (three)  times daily as needed for cough.   . busPIRone (BUSPAR) 5 MG tablet Take 1 tablet (5 mg total) by mouth 2 (two) times daily.   . cilostazol (PLETAL) 100 MG tablet Take 100 mg by mouth 2 (two) times daily.   . diclofenac (CATAFLAM) 50 MG tablet Take 1 tablet (50 mg total) by mouth 2 (two) times daily.   . ferrous sulfate 325 (65 FE) MG EC tablet Take 1 tablet (325 mg total) by mouth daily with breakfast.   . gabapentin (NEURONTIN) 300 MG capsule Take by mouth.   . hydrochlorothiazide (HYDRODIURIL) 12.5 MG tablet Take 12.5 mg by mouth daily.   . hydroxyurea (HYDREA) 500 MG capsule TAKE 1 CAPSULE BY MOUTH DAILY (MAY TAKE WITH FOOD TO MINIMIZE GI SIDE EFFECTS)   . ibuprofen (ADVIL,MOTRIN) 800 MG tablet Take 800 mg by mouth every 8 (eight) hours as needed for moderate pain. 03/11/2015: Received from: External Pharmacy  . loratadine (CLARITIN) 10 MG tablet Take 10 mg by mouth daily.   Marland Kitchen losartan (COZAAR) 100 MG  tablet Take 100 mg by mouth daily.   . potassium chloride SA (KLOR-CON) 20 MEQ tablet Take 1 tablet (20 mEq total) by mouth daily.   . temazepam (RESTORIL) 15 MG capsule Take 1 capsule (15 mg total) by mouth at bedtime as needed for sleep.    No facility-administered encounter medications on file as of 06/05/2020.    ALLERGIES:  Allergies  Allergen Reactions  . Penicillins Rash     PHYSICAL EXAM:  ECOG PERFORMANCE STATUS: 1 - Symptomatic but completely ambulatory  Vitals:   06/05/20 0920  BP: (!) 141/64  Pulse: 81  Resp: 18  Temp: (!) 97 F (36.1 C)  SpO2: 96%   Filed Weights   06/05/20 0920  Weight: 158 lb 12.8 oz (72 kg)   Physical Exam Constitutional:      Appearance: Normal appearance. She is obese.  HENT:     Head: Normocephalic and atraumatic.     Mouth/Throat:     Mouth: Mucous membranes are moist.  Eyes:     Extraocular Movements: Extraocular movements intact.     Pupils: Pupils are equal, round, and reactive to light.  Cardiovascular:     Rate and Rhythm: Normal rate and regular rhythm.     Pulses: Normal pulses.     Heart sounds: Normal heart sounds.  Pulmonary:     Effort: Pulmonary effort is normal.     Breath sounds: Normal breath sounds.  Abdominal:     General: Bowel sounds are normal.     Palpations: Abdomen is soft.     Tenderness: There is no abdominal tenderness.  Musculoskeletal:        General: No swelling.     Right lower leg: No edema.     Left lower leg: Edema (1 + pitting edema of left ankle and foot) present.  Lymphadenopathy:     Cervical: No cervical adenopathy.  Skin:    General: Skin is warm and dry.  Neurological:     General: No focal deficit present.     Mental Status: She is alert and oriented to person, place, and time.  Psychiatric:        Mood and Affect: Mood normal.        Behavior: Behavior normal.      LABORATORY DATA:  I have reviewed the labs as listed.  CBC    Component Value Date/Time   WBC 7.1  05/29/2020 1224   RBC 2.89 (  L) 06/05/2020 1057   RBC 2.88 (L) 05/29/2020 1224   HGB 11.2 (L) 05/29/2020 1224   HCT 33.6 (L) 05/29/2020 1224   PLT 308 05/29/2020 1224   MCV 116.7 (H) 05/29/2020 1224   MCH 38.9 (H) 05/29/2020 1224   MCHC 33.3 05/29/2020 1224   RDW 15.7 (H) 05/29/2020 1224   LYMPHSABS 3.1 05/29/2020 1224   MONOABS 0.5 05/29/2020 1224   EOSABS 0.0 05/29/2020 1224   BASOSABS 0.0 05/29/2020 1224   CMP Latest Ref Rng & Units 04/01/2020 12/24/2019 08/01/2019  Glucose 70 - 99 mg/dL 113(H) 163(H) 168(H)  BUN 8 - 23 mg/dL 20 21 28(H)  Creatinine 0.44 - 1.00 mg/dL 1.08(H) 1.27(H) 1.30(H)  Sodium 135 - 145 mmol/L 136 137 138  Potassium 3.5 - 5.1 mmol/L 3.6 3.2(L) 3.8  Chloride 98 - 111 mmol/L 105 101 105  CO2 22 - 32 mmol/L 24 25 24   Calcium 8.9 - 10.3 mg/dL 9.0 9.0 9.1  Total Protein 6.5 - 8.1 g/dL 7.9 7.0 7.4  Total Bilirubin 0.3 - 1.2 mg/dL 0.5 0.8 0.6  Alkaline Phos 38 - 126 U/L 55 53 58  AST 15 - 41 U/L 17 14(L) 14(L)  ALT 0 - 44 U/L 12 9 11     DIAGNOSTIC IMAGING:  I have independently reviewed the relevant imaging and discussed with the patient.  ASSESSMENT:  1. JAK2 positive essential thrombocytosis -Major manifestation is thrombocytosis.  She also has previous history of splenectomy. -She is on 500 mg hydrea daily, takes 81 mg aspirin -Tolerating hydrea well, no fatigue, GI symptoms, or skin sores -Noted onset of unilateral left lower extremity edema on 05/28/2020  2. Iron deficiency anemia: -Patient has longstanding iron deficiency anemia, has been seen at this clinic for over 10 years -Intermittent Feraheme infusions, last given on 08/16/2019 and 08/24/2019 -Reviewed most recent labs (05/29/2020): Ferritin 38, iron saturation 21%, hemoglobin 11.2, MCV 116.7 (macrocytosis in the setting of hydroxyurea) -Despite being started on oral iron therapy, the patient's ferritin and hemoglobin have both trended downward  3.  Marginal zone lymphoma of the spleen: -Diagnosed  in 1999, status post CHOP therapy x7 cycles followed by rituximab for 1 cycle. -Posttreatment CT scan showed complete response. -Status post splenectomy   PLAN: 1. JAK2 positive essential thrombocytosis - We reviewed her labs today, platelet count is currently well controlled on hydroxyurea, platelets on 05/29/2020 were 308 - Continue 500 mg Hydrea daily -Continue 8 mg aspirin daily -Check D-dimer and left leg [place] - at high risk for DVT due to her JAK2+ ET -RTC next week to discuss [place] results - Repeat labs and RTC in 3 months  2. Iron deficiency anemia: - Patient failed to improve and on oral iron, suspect malabsorption - stop iron - Patient will be scheduled for IV Venofer 3 (300 mg-300 mg - 400 mg) - We will check stool occult blood cards x3, as patient does not have any recent EGD or colonoscopy on record - We will check erythropoietin and reticulocytes, as the patient may also have some element of anemia secondary to CKD (estimated GFR 40-50, CKD 3) - Will check SPEP, IFE, and kappa/lambda light chains to rule out other causes of anemia - Repeat labs and RTC in 3 months  3. Marginal zone lymphoma of the spleen: -No B symptoms at this time.  4.  Anxiety: -Patient started BuSpar 5 mg twice daily at last visit -Continue as needed   PLAN SUMMARY & DISPOSITION: -Labs today -Venous duplex ASAP - RTC to  discuss results -Stool cards x3 this week -RTC in 3 months for follow-up -Schedule for IV Venofer x 3 doses -Stop oral iron  All questions were answered. The patient knows to call the clinic with any problems, questions or concerns.  Medical decision making: Moderate (1 chronic illness, 1 new problem with uncertain prognosis, review of prior tests, ordering new tests)  Time spent on visit: I spent 30 minutes counseling the patient face to face. The total time spent in the appointment was 40 minutes and more than 50% was on counseling.  I, [name] PA-C, have seen  this patient in conjunction with Dr. Derek Jack. Greater than 50% of visit was performed by Dr. Delton Coombes.  Addendum: I have independently evaluated this patient and agree with HPI written by Casey Burkitt, PA-C.  She has JAK2 positive ET for which she is on hydroxyurea.  Platelet count and hematocrit within normal range.  Has anemia from iron deficiency and failed to respond to oral iron therapy.  We will schedule her for parenteral iron therapy.  We will also check for other nutritional deficiencies and anemia labs.  She will also do an ultrasound of the left leg for swelling.  Derek Jack, MD  06/21/20 12:54 PM

## 2020-06-06 LAB — PROTEIN ELECTROPHORESIS, SERUM
A/G Ratio: 0.9 (ref 0.7–1.7)
Albumin ELP: 3.3 g/dL (ref 2.9–4.4)
Alpha-1-Globulin: 0.2 g/dL (ref 0.0–0.4)
Alpha-2-Globulin: 0.6 g/dL (ref 0.4–1.0)
Beta Globulin: 0.8 g/dL (ref 0.7–1.3)
Gamma Globulin: 1.9 g/dL — ABNORMAL HIGH (ref 0.4–1.8)
Globulin, Total: 3.6 g/dL (ref 2.2–3.9)
M-Spike, %: 1.7 g/dL — ABNORMAL HIGH
Total Protein ELP: 6.9 g/dL (ref 6.0–8.5)

## 2020-06-06 LAB — KAPPA/LAMBDA LIGHT CHAINS
Kappa free light chain: 32 mg/L — ABNORMAL HIGH (ref 3.3–19.4)
Kappa, lambda light chain ratio: 1.37 (ref 0.26–1.65)
Lambda free light chains: 23.4 mg/L (ref 5.7–26.3)

## 2020-06-06 LAB — ERYTHROPOIETIN: Erythropoietin: 66.5 m[IU]/mL — ABNORMAL HIGH (ref 2.6–18.5)

## 2020-06-09 ENCOUNTER — Other Ambulatory Visit: Payer: Self-pay

## 2020-06-09 ENCOUNTER — Encounter (HOSPITAL_COMMUNITY): Payer: Self-pay

## 2020-06-09 ENCOUNTER — Ambulatory Visit (HOSPITAL_COMMUNITY)
Admission: RE | Admit: 2020-06-09 | Discharge: 2020-06-09 | Disposition: A | Discharge status: Home / Self Care | Payer: Medicare Other | Source: Ambulatory Visit | Attending: Physician Assistant | Admitting: Physician Assistant

## 2020-06-09 ENCOUNTER — Inpatient Hospital Stay (HOSPITAL_COMMUNITY): Payer: Medicare Other

## 2020-06-09 VITALS — BP 131/54 | HR 81 | Temp 97.0°F | Resp 20

## 2020-06-09 DIAGNOSIS — D472 Monoclonal gammopathy: Secondary | ICD-10-CM | POA: Diagnosis not present

## 2020-06-09 DIAGNOSIS — F419 Anxiety disorder, unspecified: Secondary | ICD-10-CM | POA: Diagnosis not present

## 2020-06-09 DIAGNOSIS — E669 Obesity, unspecified: Secondary | ICD-10-CM | POA: Diagnosis not present

## 2020-06-09 DIAGNOSIS — Z79899 Other long term (current) drug therapy: Secondary | ICD-10-CM | POA: Diagnosis not present

## 2020-06-09 DIAGNOSIS — E7800 Pure hypercholesterolemia, unspecified: Secondary | ICD-10-CM | POA: Diagnosis not present

## 2020-06-09 DIAGNOSIS — D471 Chronic myeloproliferative disease: Secondary | ICD-10-CM | POA: Insufficient documentation

## 2020-06-09 DIAGNOSIS — Z7982 Long term (current) use of aspirin: Secondary | ICD-10-CM | POA: Diagnosis not present

## 2020-06-09 DIAGNOSIS — I10 Essential (primary) hypertension: Secondary | ICD-10-CM | POA: Diagnosis not present

## 2020-06-09 DIAGNOSIS — D509 Iron deficiency anemia, unspecified: Secondary | ICD-10-CM

## 2020-06-09 DIAGNOSIS — D473 Essential (hemorrhagic) thrombocythemia: Secondary | ICD-10-CM | POA: Diagnosis not present

## 2020-06-09 DIAGNOSIS — R600 Localized edema: Secondary | ICD-10-CM | POA: Diagnosis not present

## 2020-06-09 DIAGNOSIS — Z87891 Personal history of nicotine dependence: Secondary | ICD-10-CM | POA: Diagnosis not present

## 2020-06-09 LAB — IMMUNOFIXATION ELECTROPHORESIS
IgA: 146 mg/dL (ref 64–422)
IgG (Immunoglobin G), Serum: 2234 mg/dL — ABNORMAL HIGH (ref 586–1602)
IgM (Immunoglobulin M), Srm: 121 mg/dL (ref 26–217)
Total Protein ELP: 7.5 g/dL (ref 6.0–8.5)

## 2020-06-09 MED ORDER — SODIUM CHLORIDE 0.9 % IV SOLN: Freq: Once | INTRAVENOUS | Status: AC

## 2020-06-09 MED ORDER — ACETAMINOPHEN 325 MG PO TABS
650.0000 mg | ORAL_TABLET | Freq: Once | ORAL | Status: AC
  Administered 2020-06-09: 650 mg via ORAL

## 2020-06-09 MED ORDER — ACETAMINOPHEN 325 MG PO TABS
ORAL_TABLET | ORAL | Status: AC
  Filled 2020-06-09: qty 1

## 2020-06-09 MED ORDER — SODIUM CHLORIDE 0.9 % IV SOLN
300.0000 mg | Freq: Once | INTRAVENOUS | Status: AC
  Administered 2020-06-09: 300 mg via INTRAVENOUS
  Filled 2020-06-09: qty 15

## 2020-06-09 NOTE — Patient Instructions (Signed)
[facility]  Discharge Instructions:   _______________________________________________________________  Thank you for choosing [facility] at [facility] to provide your oncology and hematology care.  To afford each patient quality time with our providers, please arrive at least 15 minutes before your scheduled appointment.  You need to re-schedule your appointment if you arrive 10 or more minutes late.  We strive to give you quality time with our providers, and arriving late affects you and other patients whose appointments are after yours.  Also, if you no show three or more times for appointments you may be dismissed from the clinic.  Again, thank you for choosing [facility] at [facility]. Our hope is that these requests will allow you access to exceptional care and in a timely manner. _______________________________________________________________  If you have questions after your visit, please contact our office at [phone] between the hours of 8:30 a.m. and 5:00 p.m. Voicemails left after 4:30 p.m. will not be returned until the following business day. _______________________________________________________________  For prescription refill requests, have your pharmacy contact our office. _______________________________________________________________  Recommendations made by the consultant and any test results will be sent to your referring physician. _______________________________________________________________ 

## 2020-06-09 NOTE — Progress Notes (Signed)
Patient presents today for Venofer infusion 300 mg. Vital signs stable.  Patient has no complaints of any side effects from her last iron treatment. MAR reviewed and updated.   Venofer given today per MD orders. Tolerated infusion without adverse affects. Vital signs stable. No complaints at this time. Discharged from clinic ambulatory in stable condition. Alert and oriented x 3. F/U with [facility] as scheduled.

## 2020-06-12 ENCOUNTER — Inpatient Hospital Stay (HOSPITAL_COMMUNITY): Payer: Medicare Other

## 2020-06-12 ENCOUNTER — Inpatient Hospital Stay (HOSPITAL_BASED_OUTPATIENT_CLINIC_OR_DEPARTMENT_OTHER): Payer: Medicare Other | Admitting: Physician Assistant

## 2020-06-12 ENCOUNTER — Ambulatory Visit (HOSPITAL_COMMUNITY)
Admission: RE | Admit: 2020-06-12 | Discharge: 2020-06-12 | Disposition: A | Discharge status: Home / Self Care | Payer: Medicare Other | Source: Ambulatory Visit | Attending: Physician Assistant | Admitting: Physician Assistant

## 2020-06-12 ENCOUNTER — Ambulatory Visit (HOSPITAL_COMMUNITY): Payer: Medicare Other

## 2020-06-12 ENCOUNTER — Other Ambulatory Visit: Payer: Self-pay

## 2020-06-12 VITALS — BP 111/51 | HR 86 | Temp 99.0°F | Resp 18 | Wt 158.4 lb

## 2020-06-12 DIAGNOSIS — Z87891 Personal history of nicotine dependence: Secondary | ICD-10-CM | POA: Diagnosis not present

## 2020-06-12 DIAGNOSIS — E7800 Pure hypercholesterolemia, unspecified: Secondary | ICD-10-CM | POA: Diagnosis not present

## 2020-06-12 DIAGNOSIS — E669 Obesity, unspecified: Secondary | ICD-10-CM | POA: Diagnosis not present

## 2020-06-12 DIAGNOSIS — I10 Essential (primary) hypertension: Secondary | ICD-10-CM | POA: Diagnosis not present

## 2020-06-12 DIAGNOSIS — D509 Iron deficiency anemia, unspecified: Secondary | ICD-10-CM

## 2020-06-12 DIAGNOSIS — D473 Essential (hemorrhagic) thrombocythemia: Secondary | ICD-10-CM | POA: Diagnosis not present

## 2020-06-12 DIAGNOSIS — F419 Anxiety disorder, unspecified: Secondary | ICD-10-CM | POA: Diagnosis not present

## 2020-06-12 DIAGNOSIS — D472 Monoclonal gammopathy: Secondary | ICD-10-CM | POA: Diagnosis not present

## 2020-06-12 DIAGNOSIS — Z79899 Other long term (current) drug therapy: Secondary | ICD-10-CM | POA: Diagnosis not present

## 2020-06-12 DIAGNOSIS — Z7982 Long term (current) use of aspirin: Secondary | ICD-10-CM | POA: Diagnosis not present

## 2020-06-12 LAB — OCCULT BLOOD X 1 CARD TO LAB, STOOL
Fecal Occult Bld: NEGATIVE
Fecal Occult Bld: NEGATIVE
Fecal Occult Bld: POSITIVE — AB

## 2020-06-12 LAB — LACTATE DEHYDROGENASE: LDH: 198 U/L — ABNORMAL HIGH (ref 98–192)

## 2020-06-12 NOTE — Progress Notes (Signed)
Leslie Williamson, [place] [address]   CLINIC:  Medical Oncology/Hematology  PCP:  Rosita Fire, MD [place]  [address] [phone]   REASON FOR VISIT:  Follow-up for anemia, JAK2+ myeloproliferative disorder with thrombocytosis, new diagnosis of MGUS  CURRENT THERAPY: Hydrea, oral iron, intermittent IV iron transfusions  INTERVAL HISTORY:  [name] 87 y.o. female returns for routine follow-up of her anemia and JAK2+ essential thrombocytosis, as well as to discuss labs indicating a new diagnosis of MGUS.  Patient was last seen on 06/05/2020 by [name] PA-C and Dr. Delton Coombes.  Patient was scheduled for IV iron infusions, and received her first dose on 06/09/2020.   She denies recent chest pain on exertion, shortness of breath on minimal exertion, pre-syncopal episodes, or palpitations.  She had not noticed any recent bleeding such as epistaxis, hematuria, hematochezia, or melena.  She did note dark stools, but only when taking iron supplement.   No erythromelalgias, aquagenic pruritis, or changes in finger/toe coloration.   She denies new bone pains.  She has been having some intermittent episodes of dizziness and vertigo for the past month.  No other new neurologic symptoms such as new-onset hearing loss, blurred vision, headache, peripheral neuropathy.  She denies B-symptoms such as fever, chills, weight loss, and night sweats.  No new masses or lymphadenopathy per her report. She denies fatigue.     She had mentioned some left lower extremity edema at her last visit, but venous duplex ultrasound (06/09/2020) ruled out DVT.  At today's visit, she notes mild bilateral lower extremity edema, but this is better in the mornings and better with propping her legs up.  She reports that the edema seems to accumulate throughout the day.  She has 90% energy and 100% appetite. She endorses that she is maintaining a stable  weight.    REVIEW OF SYSTEMS:  Review of Systems  Constitutional: Negative for appetite change, chills, diaphoresis, fatigue, fever and unexpected weight change.  HENT:   Negative for lump/mass and nosebleeds.   Eyes: Negative for eye problems.  Respiratory: Negative for cough, hemoptysis and shortness of breath.   Cardiovascular: Positive for leg swelling. Negative for chest pain and palpitations.  Gastrointestinal: Negative for abdominal pain, blood in stool, constipation, diarrhea, nausea and vomiting.  Genitourinary: Negative for hematuria.   Skin: Negative.   Neurological: Negative for dizziness, headaches and light-headedness.  Hematological: Does not bruise/bleed easily.      PAST MEDICAL/SURGICAL HISTORY:  Past Medical History:  Diagnosis Date  . Anxiety   . Complication of anesthesia    difficult to wake up  . Depression   . Hypercholesteremia   . Hypertension   . Hypokalemia   . Iron deficiency anemia   . Left hip pain   . NHL (non-Hodgkin's lymphoma) ([facility]) 12/17/2010  . Obesity   . Primary thrombocytosis ([facility]) 01/11/2006   Secondary to splenectomy    . PVD (peripheral vascular disease) ([facility])   . S/P splenectomy 01/01/2014  . 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 Vertigo, labyrinthine    Past Surgical History:  Procedure Laterality Date  . CATARACT EXTRACTION W/PHACO Left 11/03/2015   Procedure: CATARACT EXTRACTION PHACO AND INTRAOCULAR LENS PLACEMENT (IOC);  Surgeon: Tonny Branch, MD;  Location: [facility];  Service: Ophthalmology;  Laterality: Left;  CDE: 7.74  . CATARACT EXTRACTION W/PHACO Right 11/20/2015   Procedure: CATARACT  EXTRACTION PHACO AND INTRAOCULAR LENS PLACEMENT ; CDE:  8.30;  Surgeon: Tonny Branch, MD;  Location: [facility];  Service: Ophthalmology;  Laterality: Right;  . PORT-A-CATH REMOVAL       SOCIAL HISTORY:  Social History   Socioeconomic History  . Marital status:  Widowed    Spouse name: Not on file  . Number of children: Not on file  . Years of education: Not on file  . Highest education level: Not on file  Occupational History  . Not on file  Tobacco Use  . Smoking status: Former Research scientist (life sciences)  . Smokeless tobacco: Never Used  Vaping Use  . Vaping Use: Never used  Substance and Sexual Activity  . Alcohol use: No  . Drug use: No  . Sexual activity: Never  Other Topics Concern  . Not on file  Social History Narrative  . Not on file   Social Determinants of Health   Financial Resource Strain: Low Risk   . Difficulty of Paying Living Expenses: Not hard at all  Food Insecurity: No Food Insecurity  . Worried About Charity fundraiser in the Last Year: Never true  . Ran Out of Food in the Last Year: Never true  Transportation Needs: No Transportation Needs  . Lack of Transportation (Medical): No  . Lack of Transportation (Non-Medical): No  Physical Activity: Inactive  . Days of Exercise per Week: 0 days  . Minutes of Exercise per Session: 0 min  Stress: No Stress Concern Present  . Feeling of Stress : Not at all  Social Connections: Moderately Isolated  . Frequency of Communication with Friends and Family: More than three times a week  . Frequency of Social Gatherings with Friends and Family: More than three times a week  . Attends Religious Services: More than 4 times per year  . Active Member of Clubs or Organizations: No  . Attends Archivist Meetings: Never  . Marital Status: Widowed  Intimate Partner Violence: Not At Risk  . Fear of Current or Ex-Partner: No  . Emotionally Abused: No  . Physically Abused: No  . Sexually Abused: No    FAMILY HISTORY:  Family History  Problem Relation Age of Onset  . Diabetes Mother     CURRENT MEDICATIONS:  Outpatient Encounter Medications as of 06/12/2020  Medication Sig Note  . amLODipine (NORVASC) 2.5 MG tablet Take 2.5 mg by mouth daily. 01/10/2015: Received from: External  Pharmacy Received Sig:   . aspirin 81 MG tablet Take 81 mg by mouth daily.   Marland Kitchen atorvastatin (LIPITOR) 40 MG tablet Take 40 mg by mouth daily. 01/10/2015: Received from: External Pharmacy Received Sig:   . benzonatate (TESSALON) 100 MG capsule Take 1 capsule (100 mg total) by mouth 3 (three) times daily as needed for cough.   . busPIRone (BUSPAR) 5 MG tablet Take 1 tablet (5 mg total) by mouth 2 (two) times daily.   . cilostazol (PLETAL) 100 MG tablet Take 100 mg by mouth 2 (two) times daily.   . diclofenac (CATAFLAM) 50 MG tablet Take 1 tablet (50 mg total) by mouth 2 (two) times daily.   . ferrous sulfate 325 (65 FE) MG EC tablet Take 1 tablet (325 mg total) by mouth daily with breakfast.   . gabapentin (NEURONTIN) 300 MG capsule Take by mouth.   . hydrochlorothiazide (HYDRODIURIL) 12.5 MG tablet Take 12.5 mg by mouth daily.   . hydroxyurea (HYDREA) 500 MG capsule TAKE 1 CAPSULE BY MOUTH DAILY (MAY TAKE WITH  FOOD TO MINIMIZE GI SIDE EFFECTS)   . ibuprofen (ADVIL,MOTRIN) 800 MG tablet Take 800 mg by mouth every 8 (eight) hours as needed for moderate pain. 03/11/2015: Received from: External Pharmacy  . loratadine (CLARITIN) 10 MG tablet Take 10 mg by mouth daily.   Marland Kitchen losartan (COZAAR) 100 MG tablet Take 100 mg by mouth daily.   . potassium chloride SA (KLOR-CON) 20 MEQ tablet Take 1 tablet (20 mEq total) by mouth daily.   . temazepam (RESTORIL) 15 MG capsule Take 1 capsule (15 mg total) by mouth at bedtime as needed for sleep.    No facility-administered encounter medications on file as of 06/12/2020.    ALLERGIES:  Allergies  Allergen Reactions  . Penicillins Rash     PHYSICAL EXAM:  ECOG PERFORMANCE STATUS: 0 - Asymptomatic  There were no vitals filed for this visit. There were no vitals filed for this visit. Physical Exam Constitutional:      Appearance: Normal appearance. She is obese.  HENT:     Head: Normocephalic and atraumatic.     Mouth/Throat:     Mouth: Mucous membranes  are moist.  Eyes:     Extraocular Movements: Extraocular movements intact.     Pupils: Pupils are equal, round, and reactive to light.  Cardiovascular:     Rate and Rhythm: Normal rate and regular rhythm.     Pulses: Normal pulses.     Heart sounds: Normal heart sounds.  Pulmonary:     Effort: Pulmonary effort is normal.     Breath sounds: Normal breath sounds.  Abdominal:     General: Bowel sounds are normal.     Palpations: Abdomen is soft.     Tenderness: There is no abdominal tenderness.  Musculoskeletal:        General: No swelling.     Right lower leg: 1+ Edema present.     Left lower leg: 1+ Edema present.  Lymphadenopathy:     Cervical: No cervical adenopathy.  Skin:    General: Skin is warm and dry.  Neurological:     General: No focal deficit present.     Mental Status: She is alert and oriented to person, place, and time.  Psychiatric:        Mood and Affect: Mood normal.        Behavior: Behavior normal.      LABORATORY DATA:  I have reviewed the labs as listed.  CBC    Component Value Date/Time   WBC 7.1 05/29/2020 1224   RBC 2.89 (L) 06/05/2020 1057   RBC 2.88 (L) 05/29/2020 1224   HGB 11.2 (L) 05/29/2020 1224   HCT 33.6 (L) 05/29/2020 1224   PLT 308 05/29/2020 1224   MCV 116.7 (H) 05/29/2020 1224   MCH 38.9 (H) 05/29/2020 1224   MCHC 33.3 05/29/2020 1224   RDW 15.7 (H) 05/29/2020 1224   LYMPHSABS 3.1 05/29/2020 1224   MONOABS 0.5 05/29/2020 1224   EOSABS 0.0 05/29/2020 1224   BASOSABS 0.0 05/29/2020 1224   CMP Latest Ref Rng & Units 04/01/2020 12/24/2019 08/01/2019  Glucose 70 - 99 mg/dL 113(H) 163(H) 168(H)  BUN 8 - 23 mg/dL 20 21 28(H)  Creatinine 0.44 - 1.00 mg/dL 1.08(H) 1.27(H) 1.30(H)  Sodium 135 - 145 mmol/L 136 137 138  Potassium 3.5 - 5.1 mmol/L 3.6 3.2(L) 3.8  Chloride 98 - 111 mmol/L 105 101 105  CO2 22 - 32 mmol/L _0 Calcium 8.9 - 10.3 mg/dL 9.0 9.0 9.1  Total Protein 6.5 - 8.1 g/dL 7.9 7.0 7.4  Total Bilirubin 0.3 - 1.2  mg/dL 0.5 0.8 0.6  Alkaline Phos 38 - 126 U/L 55 53 58  AST 15 - 41 U/L 17 14(L) 14(L)  ALT 0 - 44 U/L _0 DIAGNOSTIC IMAGING:  I have independently reviewed the relevant imaging and discussed with the patient.  ASSESSMENT: 1. JAK2 positive essential thrombocytosis -Major manifestation is thrombocytosis. She also has previous history of splenectomy. -She is on 500 mg hydrea daily, takes 81 mg aspirin -Tolerating hydrea well, no fatigue, GI symptoms, or skin sores -Noted onset of unilateral left lower extremity edema on 05/28/2020, but venous duplex on 06/09/2020 was negative for DVT  2. Iron deficiency anemia: -Patient has longstanding iron deficiency anemia, has been seen at this clinic for over 10 years -Intermittent Feraheme infusions, last given on 08/16/2019 and 08/24/2019 -Reviewed most recent labs (05/29/2020): Ferritin 38, iron saturation 21%, hemoglobin 11.2, MCV 116.7 (macrocytosis in the setting of hydroxyurea) -Despite being started on oral iron therapy, the patient's ferritin and hemoglobin have both trended downward - Erythropoietin elevated at 66.5, but calculated reticulocyte index (1.07) indicates hypoproliferation and failure to adequately respond to anemia likely due to iron deficiency   3.  MGUS, newly diagnosed -Myeloma panel checked on 06/05/2020 to investigate other possible causes of anemia revealed underlying MGUS - SPEP showed M spike 1.7, IFE showed monoclonal protein of both IgG lambda and IgM kappa; kappa free light chain only mildly elevated at 32.0 with normal lambda free light chains 23.4; normal free light chain ratio 1.37 - CMP on 04/01/2020 shows stable creatinine 1.08; normal calcium 9.0; most recent hemoglobin 11.2 - No new bone pains, does not meat C.R.A.B. criteria  4. History of marginal zone lymphoma of the spleen: -Diagnosed in 1999, status post CHOP therapy x7 cycles followed by rituximab for 1 cycle. -Posttreatment CT scan showed complete  response. -Status post splenectomy  PLAN:  1. JAK2 positive essential thrombocytosis - Continue 500 mg Hydrea daily -Continue 81 mg aspirin daily - Repeat labs and RTC in 3 months  2. Iron deficiency anemia: - Patient failed to improve and on oral iron, suspect malabsorption - stop iron -  Continue IV Venofer x 3 (300 mg-300 mg - 400 mg) -  Patient had 1 of 3 stool occult blood cards positive for occult blood - referral has been sent to gastroenterology for further evaluation of possible GI bleeding - Repeat labs and RTC in 3 months, including repeat reticulocytes after iron repletion  3.  MGUS, newly diagnosed -Myeloma screening panel checked on 06/05/2020 revealed SPEP with M spike 1.7 and IFE showing 2 separate monoclonal proteins (IgG lambda and IgM kappa) - CMP on 04/01/2020 shows stable creatinine 1.08; normal calcium 9.0; most recent hemoglobin 11.2 - We will complete MGUS/myeloma work-up with LDH, beta-2 microglobulin, 24-hour urine IFE with UPEP, skeletal survey - will call patient with any significantly abnormal labs/imaging results, otherwise will follow-up in 3 months - Will repeat MGUS/myeloma labs (including repeat IFE due to biclonal finding on initial IFE) in 3 months - We will consider bone marrow biopsy depending on the above results  4. Marginal zone lymphoma of the spleen: -No B symptoms at this time.  5.Anxiety: -Patient started BuSpar 5 mg twice daily at last visit -Continue as needed   PLAN SUMMARY & DISPOSITION: - MGUS/myeloma work-up today (LDH, beta-2 microglobulin, 24-hour urine with UPEP/IFE, skeletal survey) - We will call patient with any  abnormal results, otherwise follow-up in 3 months - Patient already scheduled for repeat anemia labs (LDH, CBC, CMP, Ferritin, Iron/TIBC) and anemia follow-up visit in 3 months - we will add follow-up labs for MGUS (IFE, SPEP, FLC) as well  All questions were answered. The patient knows to call the clinic with  any problems, questions or concerns.   Medical decision making: Moderate (1 new problem with uncertain prognosis under work-up, review of prior labs, ordering new tests)  Time spent on visit: I spent 25 minutes counseling the patient face to face. The total time spent in the appointment was 40 minutes and more than 50% was on counseling.   Harriett Rush, PA-C  06/12/20 10:35 AM

## 2020-06-12 NOTE — Patient Instructions (Signed)
[place] at [facility] Discharge Instructions  You were seen today by [name] PA-C for your anemia and elevated platelets (essential thrombocytosis).  Our recent lab tests also show that you have abnormal protein in your blood called MGUS ("monoclonal gammopathy of undetermined significance").  We will check more labs today, and also will need to get some x-rays.  Most likely, this is just an abnormal protein that we can keep an eye on, but it does place you at a slightly higher risk of developing a cancer called multiple myeloma.  We will continue to follow-up with you regarding this and your other conditions.  LABS:  -Labs today before you leave the hospital - Return in 3 months on 08/28/2020 for repeat labs  OTHER TESTS: Skeletal survey (whole-body x-rays) today  MEDICATIONS: Continue to take your Hydrea and your aspirin  FOLLOW-UP APPOINTMENT: Follow-up appointment in 3 months   Thank you for choosing [place] at [facility] to provide your oncology and hematology care.  To afford each patient quality time with our provider, please arrive at least 15 minutes before your scheduled appointment time.   If you have a lab appointment with the [facility] please come in thru the Main Entrance and check in at the main information desk.  You need to re-schedule your appointment should you arrive 10 or more minutes late.  We strive to give you quality time with our providers, and arriving late affects you and other patients whose appointments are after yours.  Also, if you no show three or more times for appointments you may be dismissed from the clinic at the providers discretion.     Again, thank you for choosing [facility].  Our hope is that these requests will decrease the amount of time that you wait before being seen by our physicians.       _____________________________________________________________  Should you  have questions after your visit to [facility], please contact our office at [phone] and follow the prompts.  Our office hours are 8:00 a.m. and 4:30 p.m. Monday - Friday.  Please note that voicemails left after 4:00 p.m. may not be returned until the following business day.  We are closed weekends and major holidays.  You do have access to a nurse 24-7, just call the main number to the clinic [phone] and do not press any options, hold on the line and a nurse will answer the phone.    For prescription refill requests, have your pharmacy contact our office and allow 72 hours.    Due to Covid, you will need to wear a mask upon entering the hospital. If you do not have a mask, a mask will be given to you at the Main Entrance upon arrival. For doctor visits, patients may have 1 support person age 85 or older with them. For treatment visits, patients can not have anyone with them due to social distancing guidelines and our immunocompromised population.

## 2020-06-13 ENCOUNTER — Encounter (INDEPENDENT_AMBULATORY_CARE_PROVIDER_SITE_OTHER): Payer: Self-pay | Admitting: *Deleted

## 2020-06-13 LAB — BETA 2 MICROGLOBULIN, SERUM: Beta-2 Microglobulin: 2.8 mg/L — ABNORMAL HIGH (ref 0.6–2.4)

## 2020-06-16 ENCOUNTER — Encounter (HOSPITAL_COMMUNITY): Payer: Self-pay

## 2020-06-16 ENCOUNTER — Other Ambulatory Visit: Payer: Self-pay

## 2020-06-16 ENCOUNTER — Other Ambulatory Visit (HOSPITAL_COMMUNITY)
Admission: RE | Admit: 2020-06-16 | Discharge: 2020-06-16 | Disposition: A | Discharge status: Home / Self Care | Payer: Medicare Other | Source: Ambulatory Visit | Attending: Hematology | Admitting: Hematology

## 2020-06-16 ENCOUNTER — Inpatient Hospital Stay (HOSPITAL_COMMUNITY): Payer: Medicare Other

## 2020-06-16 VITALS — BP 118/55 | HR 82 | Temp 96.9°F | Resp 18

## 2020-06-16 DIAGNOSIS — D473 Essential (hemorrhagic) thrombocythemia: Secondary | ICD-10-CM | POA: Diagnosis not present

## 2020-06-16 DIAGNOSIS — Z87891 Personal history of nicotine dependence: Secondary | ICD-10-CM | POA: Diagnosis not present

## 2020-06-16 DIAGNOSIS — D472 Monoclonal gammopathy: Secondary | ICD-10-CM | POA: Diagnosis not present

## 2020-06-16 DIAGNOSIS — I10 Essential (primary) hypertension: Secondary | ICD-10-CM | POA: Diagnosis not present

## 2020-06-16 DIAGNOSIS — F419 Anxiety disorder, unspecified: Secondary | ICD-10-CM | POA: Diagnosis not present

## 2020-06-16 DIAGNOSIS — E7800 Pure hypercholesterolemia, unspecified: Secondary | ICD-10-CM | POA: Diagnosis not present

## 2020-06-16 DIAGNOSIS — Z7982 Long term (current) use of aspirin: Secondary | ICD-10-CM | POA: Diagnosis not present

## 2020-06-16 DIAGNOSIS — D509 Iron deficiency anemia, unspecified: Secondary | ICD-10-CM

## 2020-06-16 DIAGNOSIS — E669 Obesity, unspecified: Secondary | ICD-10-CM | POA: Diagnosis not present

## 2020-06-16 DIAGNOSIS — Z79899 Other long term (current) drug therapy: Secondary | ICD-10-CM | POA: Diagnosis not present

## 2020-06-16 MED ORDER — SODIUM CHLORIDE 0.9 % IV SOLN: Freq: Once | INTRAVENOUS | Status: AC

## 2020-06-16 MED ORDER — ACETAMINOPHEN 325 MG PO TABS
650.0000 mg | ORAL_TABLET | Freq: Once | ORAL | Status: AC
Start: 2020-06-16 — End: 2020-06-16
  Administered 2020-06-16: 650 mg via ORAL

## 2020-06-16 MED ORDER — SODIUM CHLORIDE 0.9 % IV SOLN
300.0000 mg | Freq: Once | INTRAVENOUS | Status: AC
  Administered 2020-06-16: 300 mg via INTRAVENOUS
  Filled 2020-06-16: qty 15

## 2020-06-16 MED ORDER — ACETAMINOPHEN 325 MG PO TABS
ORAL_TABLET | ORAL | Status: AC
  Filled 2020-06-16: qty 2

## 2020-06-16 NOTE — Patient Instructions (Signed)
[facility]  Discharge Instructions: Thank you for choosing [facility] to provide your oncology and hematology care.  If you have a lab appointment with the [facility], please come in thru the Main Entrance and check in at the main information desk.  Wear comfortable clothing and clothing appropriate for easy access to any Portacath or PICC line.   We strive to give you quality time with your provider. You may need to reschedule your appointment if you arrive late (15 or more minutes).  Arriving late affects you and other patients whose appointments are after yours.  Also, if you miss three or more appointments without notifying the office, you may be dismissed from the clinic at the provider's discretion.      For prescription refill requests, have your pharmacy contact our office and allow 72 hours for refills to be completed.    Today you received your iron infusion, return as scheduled.     To help prevent nausea and vomiting after your treatment, we encourage you to take your nausea medication as directed.  BELOW ARE SYMPTOMS THAT SHOULD BE REPORTED IMMEDIATELY: . *FEVER GREATER THAN 100.4 F (38 C) OR HIGHER . *CHILLS OR SWEATING . *NAUSEA AND VOMITING THAT IS NOT CONTROLLED WITH YOUR NAUSEA MEDICATION . *UNUSUAL SHORTNESS OF BREATH . *UNUSUAL BRUISING OR BLEEDING . *URINARY PROBLEMS (pain or burning when urinating, or frequent urination) . *BOWEL PROBLEMS (unusual diarrhea, constipation, pain near the anus) . TENDERNESS IN MOUTH AND THROAT WITH OR WITHOUT PRESENCE OF ULCERS (sore throat, sores in mouth, or a toothache) . UNUSUAL RASH, SWELLING OR PAIN  . UNUSUAL VAGINAL DISCHARGE OR ITCHING   Items with * indicate a potential emergency and should be followed up as soon as possible or go to the Emergency Department if any problems should occur.  Please show the CHEMOTHERAPY ALERT CARD or IMMUNOTHERAPY ALERT CARD at check-in to the Emergency Department  and triage nurse.  Should you have questions after your visit or need to cancel or reschedule your appointment, please contact [facility] [phone]  and follow the prompts.  Office hours are 8:00 a.m. to 4:30 p.m. Monday - Friday. Please note that voicemails left after 4:00 p.m. may not be returned until the following business day.  We are closed weekends and major holidays. You have access to a nurse at all times for urgent questions. Please call the main number to the clinic [phone] and follow the prompts.  For any non-urgent questions, you may also contact your provider using MyChart. We now offer e-Visits for anyone 85 and older to request care online for non-urgent symptoms. For details visit [email or web address].   Also download the MyChart app! Go to the app store, search "MyChart", open the app, select [facility], and log in with your MyChart username and password.  Due to Covid, a mask is required upon entering the hospital/clinic. If you do not have a mask, one will be given to you upon arrival. For doctor visits, patients may have 1 support person aged 52 or older with them. For treatment visits, patients cannot have anyone with them due to current Covid guidelines and our immunocompromised population.

## 2020-06-16 NOTE — Progress Notes (Signed)
Patient tolerated iron infusion with no complaints voiced.  Peripheral IV site clean and dry with good blood return noted before and after infusion.  Band aid applied.  VSS with discharge and left in satisfactory condition with no s/s of distress noted.   

## 2020-06-18 LAB — UPEP/UIFE/LIGHT CHAINS/TP, 24-HR UR
% BETA, Urine: 0 %
ALPHA 1 URINE: 0 %
Albumin, U: 100 %
Alpha 2, Urine: 0 %
Free Kappa Lt Chains,Ur: 47.68 mg/L (ref 1.17–86.46)
Free Kappa/Lambda Ratio: 4.84 (ref 1.83–14.26)
Free Lambda Lt Chains,Ur: 9.85 mg/L (ref 0.27–15.21)
GAMMA GLOBULIN URINE: 0 %
Total Protein, Urine-Ur/day: 179 mg/24 hr — ABNORMAL HIGH (ref 30–150)
Total Protein, Urine: 17.9 mg/dL
Total Volume: 1000

## 2020-07-03 DIAGNOSIS — C8580 Other specified types of non-Hodgkin lymphoma, unspecified site: Secondary | ICD-10-CM | POA: Diagnosis not present

## 2020-07-03 DIAGNOSIS — I10 Essential (primary) hypertension: Secondary | ICD-10-CM | POA: Diagnosis not present

## 2020-08-03 DIAGNOSIS — I739 Peripheral vascular disease, unspecified: Secondary | ICD-10-CM | POA: Diagnosis not present

## 2020-08-03 DIAGNOSIS — I10 Essential (primary) hypertension: Secondary | ICD-10-CM | POA: Diagnosis not present

## 2020-08-28 ENCOUNTER — Other Ambulatory Visit: Payer: Self-pay

## 2020-08-28 ENCOUNTER — Inpatient Hospital Stay (HOSPITAL_COMMUNITY): Payer: Medicare Other | Attending: Hematology

## 2020-08-28 DIAGNOSIS — F419 Anxiety disorder, unspecified: Secondary | ICD-10-CM | POA: Insufficient documentation

## 2020-08-28 DIAGNOSIS — Z8572 Personal history of non-Hodgkin lymphomas: Secondary | ICD-10-CM | POA: Diagnosis not present

## 2020-08-28 DIAGNOSIS — Z7982 Long term (current) use of aspirin: Secondary | ICD-10-CM | POA: Insufficient documentation

## 2020-08-28 DIAGNOSIS — D472 Monoclonal gammopathy: Secondary | ICD-10-CM | POA: Insufficient documentation

## 2020-08-28 DIAGNOSIS — D631 Anemia in chronic kidney disease: Secondary | ICD-10-CM | POA: Diagnosis not present

## 2020-08-28 DIAGNOSIS — D473 Essential (hemorrhagic) thrombocythemia: Secondary | ICD-10-CM | POA: Insufficient documentation

## 2020-08-28 DIAGNOSIS — Z9081 Acquired absence of spleen: Secondary | ICD-10-CM | POA: Diagnosis not present

## 2020-08-28 DIAGNOSIS — N1832 Chronic kidney disease, stage 3b: Secondary | ICD-10-CM | POA: Insufficient documentation

## 2020-08-28 DIAGNOSIS — D509 Iron deficiency anemia, unspecified: Secondary | ICD-10-CM | POA: Diagnosis not present

## 2020-08-28 DIAGNOSIS — D471 Chronic myeloproliferative disease: Secondary | ICD-10-CM

## 2020-08-28 DIAGNOSIS — I129 Hypertensive chronic kidney disease with stage 1 through stage 4 chronic kidney disease, or unspecified chronic kidney disease: Secondary | ICD-10-CM | POA: Insufficient documentation

## 2020-08-28 LAB — CBC WITH DIFFERENTIAL/PLATELET
Abs Immature Granulocytes: 0.01 10*3/uL (ref 0.00–0.07)
Basophils Absolute: 0 10*3/uL (ref 0.0–0.1)
Basophils Relative: 0 %
Eosinophils Absolute: 0 10*3/uL (ref 0.0–0.5)
Eosinophils Relative: 0 %
HCT: 33 % — ABNORMAL LOW (ref 36.0–46.0)
Hemoglobin: 10.9 g/dL — ABNORMAL LOW (ref 12.0–15.0)
Immature Granulocytes: 0 %
Lymphocytes Relative: 48 %
Lymphs Abs: 2.9 10*3/uL (ref 0.7–4.0)
MCH: 38.4 pg — ABNORMAL HIGH (ref 26.0–34.0)
MCHC: 33 g/dL (ref 30.0–36.0)
MCV: 116.2 fL — ABNORMAL HIGH (ref 80.0–100.0)
Monocytes Absolute: 0.6 10*3/uL (ref 0.1–1.0)
Monocytes Relative: 10 %
Neutro Abs: 2.6 10*3/uL (ref 1.7–7.7)
Neutrophils Relative %: 42 %
Platelets: 251 10*3/uL (ref 150–400)
RBC: 2.84 MIL/uL — ABNORMAL LOW (ref 3.87–5.11)
RDW: 17.4 % — ABNORMAL HIGH (ref 11.5–15.5)
WBC: 6.2 10*3/uL (ref 4.0–10.5)
nRBC: 0 % (ref 0.0–0.2)

## 2020-08-28 LAB — IRON AND TIBC
Iron: 72 ug/dL (ref 28–170)
Saturation Ratios: 27 % (ref 10.4–31.8)
TIBC: 267 ug/dL (ref 250–450)
UIBC: 195 ug/dL

## 2020-08-28 LAB — COMPREHENSIVE METABOLIC PANEL
ALT: 10 U/L (ref 0–44)
AST: 14 U/L — ABNORMAL LOW (ref 15–41)
Albumin: 3.5 g/dL (ref 3.5–5.0)
Alkaline Phosphatase: 56 U/L (ref 38–126)
Anion gap: 6 (ref 5–15)
BUN: 24 mg/dL — ABNORMAL HIGH (ref 8–23)
CO2: 22 mmol/L (ref 22–32)
Calcium: 8.9 mg/dL (ref 8.9–10.3)
Chloride: 106 mmol/L (ref 98–111)
Creatinine, Ser: 1.23 mg/dL — ABNORMAL HIGH (ref 0.44–1.00)
GFR, Estimated: 43 mL/min — ABNORMAL LOW (ref >60)
Glucose, Bld: 90 mg/dL (ref 70–99)
Potassium: 3.7 mmol/L (ref 3.5–5.1)
Sodium: 134 mmol/L — ABNORMAL LOW (ref 135–145)
Total Bilirubin: 0.7 mg/dL (ref 0.3–1.2)
Total Protein: 7.3 g/dL (ref 6.5–8.1)

## 2020-08-28 LAB — FERRITIN: Ferritin: 112 ng/mL (ref 11–307)

## 2020-08-28 LAB — RETICULOCYTES
Immature Retic Fract: 22 % — ABNORMAL HIGH (ref 2.3–15.9)
RBC.: 2.85 MIL/uL — ABNORMAL LOW (ref 3.87–5.11)
Retic Count, Absolute: 53.9 10*3/uL (ref 19.0–186.0)
Retic Ct Pct: 1.9 % (ref 0.4–3.1)

## 2020-08-28 LAB — LACTATE DEHYDROGENASE: LDH: 122 U/L (ref 98–192)

## 2020-08-31 LAB — KAPPA/LAMBDA LIGHT CHAINS
Kappa free light chain: 35.2 mg/L — ABNORMAL HIGH (ref 3.3–19.4)
Kappa, lambda light chain ratio: 1.35 (ref 0.26–1.65)
Lambda free light chains: 26 mg/L (ref 5.7–26.3)

## 2020-09-01 LAB — IMMUNOFIXATION ELECTROPHORESIS
IgA: 129 mg/dL (ref 64–422)
IgG (Immunoglobin G), Serum: 2180 mg/dL — ABNORMAL HIGH (ref 586–1602)
IgM (Immunoglobulin M), Srm: 122 mg/dL (ref 26–217)
Total Protein ELP: 7.1 g/dL (ref 6.0–8.5)

## 2020-09-01 LAB — PROTEIN ELECTROPHORESIS, SERUM
A/G Ratio: 0.9 (ref 0.7–1.7)
Albumin ELP: 3.5 g/dL (ref 2.9–4.4)
Alpha-1-Globulin: 0.2 g/dL (ref 0.0–0.4)
Alpha-2-Globulin: 0.6 g/dL (ref 0.4–1.0)
Beta Globulin: 0.8 g/dL (ref 0.7–1.3)
Gamma Globulin: 2 g/dL — ABNORMAL HIGH (ref 0.4–1.8)
Globulin, Total: 3.7 g/dL (ref 2.2–3.9)
M-Spike, %: 1.9 g/dL — ABNORMAL HIGH
Total Protein ELP: 7.2 g/dL (ref 6.0–8.5)

## 2020-09-02 DIAGNOSIS — I10 Essential (primary) hypertension: Secondary | ICD-10-CM | POA: Diagnosis not present

## 2020-09-02 DIAGNOSIS — D471 Chronic myeloproliferative disease: Secondary | ICD-10-CM | POA: Diagnosis not present

## 2020-09-04 ENCOUNTER — Ambulatory Visit (HOSPITAL_COMMUNITY): Payer: Medicare Other | Admitting: Hematology

## 2020-09-08 NOTE — Progress Notes (Signed)
[place], [place] [address]   CLINIC:  Medical Oncology/Hematology  PCP:  Rosita Fire, MD [place] [address] [phone]   REASON FOR VISIT:  Follow-up for anemia, JAK2 positive essential thrombocytosis, MGUS  CURRENT THERAPY: Hydrea, oral iron, intermittent IV iron infusions  INTERVAL HISTORY:  [name] 87 y.o. female returns for routine follow-up of JAK2 positive essential thrombocytosis, iron deficiency anemia, and MGUS.  She was last seen by [name] PA-C on 06/12/2020.  At today's visit, she reports feeling fairly well.  No recent hospitalizations, surgeries, or changes in baseline health status.  She denies recent chest pain on exertion, shortness of breath on minimal exertion, pre-syncopal episodes, or palpitations. She had not noticed any recent bleeding such as epistaxis, hematuria, hematochezia, or melena.  She did note dark stools, but only when taking iron supplement.    No erythromelalgias, aquagenic pruritis, or changes in finger/toe coloration.  She denies new bone pains.  No dizziness or other new neurologic symptoms such as new-onset hearing loss, blurred vision, headache, peripheral neuropathy.  She denies B-symptoms such as fever, chills, weight loss, and night sweats.  No new masses or lymphadenopathy per her report. She denies fatigue.    Patient does report that she "has some difficulty remembering things."  Brief mental exam shows that she is alert and oriented x3 (able to tell me her name, location, and month).  She can tell me who the current president is.  However, she is unable to name the current year.   She has 80% energy and 100% appetite. She endorses that she is maintaining a stable weight.    REVIEW OF SYSTEMS:  Review of Systems  Constitutional:  Negative for appetite change, chills, diaphoresis, fatigue, fever and unexpected weight change.  HENT:   Negative for  lump/mass and nosebleeds.   Eyes:  Negative for eye problems.  Respiratory:  Negative for cough, hemoptysis and shortness of breath.   Cardiovascular:  Negative for chest pain, leg swelling and palpitations.  Gastrointestinal:  Negative for abdominal pain, blood in stool, constipation, diarrhea, nausea and vomiting.  Genitourinary:  Negative for hematuria.   Skin: Negative.   Neurological:  Negative for dizziness, headaches and light-headedness.  Hematological:  Does not bruise/bleed easily.  Psychiatric/Behavioral:  Positive for confusion (difficulty remembering).      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   Vertigo, labyrinthine    Past Surgical History:  Procedure Laterality Date   CATARACT EXTRACTION W/PHACO Left 11/03/2015   Procedure: CATARACT EXTRACTION PHACO AND INTRAOCULAR LENS PLACEMENT ([facility]);  Surgeon: Tonny Branch, MD;  Location: [facility];  Service: Ophthalmology;  Laterality: Left;  CDE: 7.74   CATARACT EXTRACTION W/PHACO Right 11/20/2015   Procedure: CATARACT EXTRACTION PHACO AND INTRAOCULAR LENS PLACEMENT ; CDE:  8.30;  Surgeon: Tonny Branch, MD;  Location: [facility];  Service: Ophthalmology;  Laterality: Right;   PORT-A-CATH REMOVAL       SOCIAL HISTORY:  Social History   Socioeconomic History   Marital status: Widowed    Spouse name: Not on file   Number of children: Not on file   Years of education: Not on file   Highest education level: Not on  file  Occupational History   Not on file  Tobacco Use   Smoking status: Former   Smokeless tobacco: Never  Vaping Use   Vaping Use: Never used  Substance and Sexual Activity   Alcohol use: No   Drug use: No   Sexual activity: Never  Other Topics Concern   Not on file  Social History Narrative   Not on file   Social Determinants of Health   Financial Resource Strain: Low Risk    Difficulty of Paying Living Expenses: Not hard at all  Food Insecurity: No Food Insecurity   Worried About Charity fundraiser in the Last Year: Never true   [place] in the Last Year: Never true  Transportation Needs: No Transportation Needs   Lack of Transportation (Medical): No   Lack of Transportation (Non-Medical): No  Physical Activity: Inactive   Days of Exercise per Week: 0 days   Minutes of Exercise per Session: 0 min  Stress: No Stress Concern Present   Feeling of Stress : Not at all  Social Connections: Moderately Isolated   Frequency of Communication with Friends and Family: More than three times a week   Frequency of Social Gatherings with Friends and Family: More than three times a week   Attends Religious Services: More than 4 times per year   Active Member of [employer] or Organizations: No   Attends Archivist Meetings: Never   Marital Status: Widowed  Human resources officer Violence: Not At Risk   Fear of Current or Ex-Partner: No   Emotionally Abused: No   Physically Abused: No   Sexually Abused: No    FAMILY HISTORY:  Family History  Problem Relation Age of Onset   Diabetes Mother     CURRENT MEDICATIONS:  Outpatient Encounter Medications as of 09/09/2020  Medication Sig Note   amLODipine (NORVASC) 2.5 MG tablet Take 2.5 mg by mouth daily. 01/10/2015: Received from: External Pharmacy Received Sig:    aspirin 81 MG tablet Take 81 mg by mouth daily.    atorvastatin (LIPITOR) 40 MG tablet Take 40 mg by mouth daily. 01/10/2015: Received from: External Pharmacy  Received Sig:    benzonatate (TESSALON) 100 MG capsule Take 1 capsule (100 mg total) by mouth 3 (three) times daily as needed for cough.    busPIRone (BUSPAR) 5 MG tablet Take 1 tablet (5 mg total) by mouth 2 (two) times daily.    cilostazol (PLETAL) 100 MG tablet Take 100 mg by mouth 2 (two) times daily.    diclofenac (CATAFLAM) 50 MG tablet Take 1 tablet (50 mg total) by mouth 2 (two) times daily.    ferrous sulfate 325 (65 FE) MG EC tablet Take 1 tablet (325 mg total) by mouth daily with breakfast.    gabapentin (NEURONTIN) 300 MG capsule Take by mouth.    hydrochlorothiazide (HYDRODIURIL) 12.5 MG tablet Take 12.5 mg by mouth daily.    hydroxyurea (HYDREA) 500 MG capsule TAKE 1 CAPSULE BY MOUTH DAILY (MAY TAKE WITH FOOD TO MINIMIZE GI SIDE EFFECTS)    ibuprofen (ADVIL,MOTRIN) 800 MG tablet Take 800 mg by mouth every 8 (eight)  hours as needed for moderate pain. 03/11/2015: Received from: External Pharmacy   loratadine (CLARITIN) 10 MG tablet Take 10 mg by mouth daily.    losartan (COZAAR) 100 MG tablet Take 100 mg by mouth daily.    potassium chloride SA (KLOR-CON) 20 MEQ tablet Take 1 tablet (20 mEq total) by mouth daily.    temazepam (RESTORIL) 15 MG capsule Take 1 capsule (15 mg total) by mouth at bedtime as needed for sleep.    No facility-administered encounter medications on file as of 09/09/2020.    ALLERGIES:  Allergies  Allergen Reactions   Penicillins Rash     PHYSICAL EXAM:  ECOG PERFORMANCE STATUS: 1 - Symptomatic but completely ambulatory  There were no vitals filed for this visit. There were no vitals filed for this visit. Physical Exam Constitutional:      Appearance: Normal appearance. She is obese.  HENT:     Head: Normocephalic and atraumatic.     Mouth/Throat:     Mouth: Mucous membranes are moist.  Eyes:     Extraocular Movements: Extraocular movements intact.     Pupils: Pupils are equal, round, and reactive to light.  Cardiovascular:     Rate and Rhythm:  Normal rate and regular rhythm.     Pulses: Normal pulses.     Heart sounds: Normal heart sounds.  Pulmonary:     Effort: Pulmonary effort is normal.     Breath sounds: Normal breath sounds.  Abdominal:     General: Bowel sounds are normal.     Palpations: Abdomen is soft.     Tenderness: There is no abdominal tenderness.  Musculoskeletal:        General: No swelling.     Right lower leg: Edema (trace pretibial edema) present.     Left lower leg: Edema (trace pretibial edema) present.  Lymphadenopathy:     Cervical: No cervical adenopathy.  Skin:    General: Skin is warm and dry.  Neurological:     General: No focal deficit present.     Mental Status: She is alert and oriented to person, place, and time.  Psychiatric:        Mood and Affect: Mood normal.        Behavior: Behavior normal.     LABORATORY DATA:  I have reviewed the labs as listed.  CBC    Component Value Date/Time   WBC 6.2 08/28/2020 1101   RBC 2.85 (L) 08/28/2020 1102   RBC 2.84 (L) 08/28/2020 1101   HGB 10.9 (L) 08/28/2020 1101   HCT 33.0 (L) 08/28/2020 1101   PLT 251 08/28/2020 1101   MCV 116.2 (H) 08/28/2020 1101   MCH 38.4 (H) 08/28/2020 1101   MCHC 33.0 08/28/2020 1101   RDW 17.4 (H) 08/28/2020 1101   LYMPHSABS 2.9 08/28/2020 1101   MONOABS 0.6 08/28/2020 1101   EOSABS 0.0 08/28/2020 1101   BASOSABS 0.0 08/28/2020 1101   CMP Latest Ref Rng & Units 08/28/2020 04/01/2020 12/24/2019  Glucose 70 - 99 mg/dL 90 113(H) 163(H)  BUN 8 - 23 mg/dL 24(H) 20 21  Creatinine 0.44 - 1.00 mg/dL 1.23(H) 1.08(H) 1.27(H)  Sodium 135 - 145 mmol/L 134(L) 136 137  Potassium 3.5 - 5.1 mmol/L 3.7 3.6 3.2(L)  Chloride 98 - 111 mmol/L 106 105 101  CO2 22 - 32 mmol/L _0 Calcium 8.9 - 10.3 mg/dL 8.9 9.0 9.0  Total Protein 6.5 - 8.1 g/dL 7.3 7.9 7.0  Total Bilirubin 0.3 - 1.2 mg/dL  0.7 0.5 0.8  Alkaline Phos 38 - 126 U/L 56 55 53  AST 15 - 41 U/L 14(L) 17 14(L)  ALT 0 - 44 U/L _0 DIAGNOSTIC IMAGING:  I  have independently reviewed the relevant imaging and discussed with the patient.  ASSESSMENT & PLAN: 1.  JAK2 positive essential thrombocytosis -Major manifestation is thrombocytosis.  She also has previous history of splenectomy. -She is on 500 mg hydrea daily, takes 81 mg aspirin -Tolerating hydrea well, no fatigue, GI symptoms, or skin sores -Noted onset of unilateral left lower extremity edema on 05/28/2020, but venous duplex on 06/09/2020 was negative for DVT - Currently taking Hydrea 500 mg daily and aspirin 81 mg daily - Most recent labs (08/28/2020): Platelets 251, Hgb 10.9, MCV 116.2, normal WBC - PLAN: Due to worsening anemia, we will decrease dose of Hydrea to 500 mg Monday-Friday, no Hydrea on the weekends.  Repeat CBC in 4 weeks.  2.  Iron deficiency anemia and anemia of CKD: -Patient has longstanding iron deficiency anemia, has been seen at this clinic for over 10 years -Intermittent Feraheme infusions, last given on 06/09/2020 and 06/16/2020 - Patient failed to improve on oral iron therapy - Patient had 1/3 stool occult blood cards positive, was referred to gastroenterology but has not yet followed up with them (scheduled for 10/09/2020).  Denies any gross rectal hemorrhage or melena. - Patient has underlying CKD stage IIIb with most recent GFR 43/creatinine 1.23, which is likely also contributing to anemia.  She does not see a nephrologist. - Most recent labs (08/28/2020): Hgb 10.9 with MCV 116.2 (macrocytosis secondary to Hydrea); 27% iron saturation with ferritin 112 - Multifactorial anemia secondary to CKD, intermittent iron deficiency; may also be some suppressive effect from Hydrea. - PLAN: Decrease dose of Hydrea, as above.  Repeat CBC at follow-up visit in 4 weeks.  We will consider ESA if Hgb drops to less than 10.  3.  MGUS, IgG lambda and IgM kappa -Myeloma panel checked on 06/05/2020 to investigate other possible causes of anemia revealed underlying MGUS:  SPEP showed M spike  1.7, IFE showed monoclonal protein of both IgG lambda and IgM kappa; kappa free light chain only mildly elevated at 32.0 with normal lambda free light chains 23.4; normal free light chain ratio 1.37; LDH elevated at 198, beta-2 microglobulin elevated at 2.8 - 24-hour urine IFE showed IgG kappa monoclonal protein, M spike not observed - Skeletal survey (06/12/2020): No lytic lesions or evidence of skeletal multiple myeloma - No new bone pains, does not meet C.R.A.B. criteria - Most recent labs (08/28/2020): M spike 1.9, immunofixation once again shows IgG lambda as well as IgM kappa; light chains essentially unchanged with elevated kappa 35.2, normal lambda 26.0, normal ratio 1.35; LDH has normalized at 122.  Creatinine 1.23 (stable at baseline).  Hgb 10.9 - PLAN: Recommend bone marrow biopsy due to M-spike > 1.5 and possible oligosecretory nature of plasma cell dyscrasia.  This was discussed with the patient, but she is having some difficulty fully comprehending what we are talking about.  She is instructed to bring her son to follow-up visit in 4 weeks so that we can discuss decision making together.  4.  History of marginal zone lymphoma of the spleen: -Diagnosed in 1999, status post CHOP therapy x7 cycles followed by rituximab for 1 cycle. -Posttreatment CT scan showed complete response. -Status post splenectomy - No B symptoms at this time - PLAN: Continue monitoring with follow-up visits  5.  Anxiety: -Patient started BuSpar 5 mg twice daily at last visit - PLAN: Continue BuSpar as needed.   PLAN SUMMARY & DISPOSITION: - Decrease Hydrea to 500 mg Monday-Friday, none on Saturday/Sunday - Repeat CBC, CMP, LDH in 4 weeks - Follow-up visit in 4 weeks (patient will bring her son to this visit so that we can discuss the above diagnoses and further work-up including bone marrow biopsy and possible treatment)   All questions were answered. The patient knows to call the clinic with any problems,  questions or concerns.  Medical decision making: Moderate  Time spent on visit: I spent 25 minutes counseling the patient face to face. The total time spent in the appointment was 40 minutes and more than 50% was on counseling.   Harriett Rush, PA-C  09/09/2020 12:04 PM

## 2020-09-09 ENCOUNTER — Inpatient Hospital Stay (HOSPITAL_COMMUNITY): Payer: Medicare Other | Admitting: Physician Assistant

## 2020-09-09 ENCOUNTER — Other Ambulatory Visit: Payer: Self-pay

## 2020-09-09 VITALS — BP 109/57 | HR 93 | Temp 97.1°F | Resp 18 | Wt 156.7 lb

## 2020-09-09 DIAGNOSIS — D631 Anemia in chronic kidney disease: Secondary | ICD-10-CM | POA: Diagnosis not present

## 2020-09-09 DIAGNOSIS — Z9081 Acquired absence of spleen: Secondary | ICD-10-CM | POA: Diagnosis not present

## 2020-09-09 DIAGNOSIS — D473 Essential (hemorrhagic) thrombocythemia: Secondary | ICD-10-CM | POA: Diagnosis not present

## 2020-09-09 DIAGNOSIS — D471 Chronic myeloproliferative disease: Secondary | ICD-10-CM

## 2020-09-09 DIAGNOSIS — D509 Iron deficiency anemia, unspecified: Secondary | ICD-10-CM | POA: Diagnosis not present

## 2020-09-09 DIAGNOSIS — I129 Hypertensive chronic kidney disease with stage 1 through stage 4 chronic kidney disease, or unspecified chronic kidney disease: Secondary | ICD-10-CM | POA: Diagnosis not present

## 2020-09-09 DIAGNOSIS — Z7982 Long term (current) use of aspirin: Secondary | ICD-10-CM | POA: Diagnosis not present

## 2020-09-09 DIAGNOSIS — D472 Monoclonal gammopathy: Secondary | ICD-10-CM

## 2020-09-09 DIAGNOSIS — N1832 Chronic kidney disease, stage 3b: Secondary | ICD-10-CM | POA: Diagnosis not present

## 2020-09-09 DIAGNOSIS — F419 Anxiety disorder, unspecified: Secondary | ICD-10-CM | POA: Diagnosis not present

## 2020-09-09 DIAGNOSIS — Z8572 Personal history of non-Hodgkin lymphomas: Secondary | ICD-10-CM | POA: Diagnosis not present

## 2020-09-09 NOTE — Patient Instructions (Signed)
[place] at [facility] Discharge Instructions  You were seen today by [name] PA-C for your elevated platelets (essential thrombocytosis), low blood (anemia), and abnormal protein (MGUS).    PLATELETS: Your platelet levels are good.  Continue taking aspirin 81 mg daily.  We are going to decrease the dose of your hydroxyurea, as below.  ANEMIA / LOW BLOOD: Your blood count remains mildly low.  This may be related to some chronic kidney disease, but also may be from your Hydrea.  We are going to decrease the dose of your Hydrea to see if this will help your body make more blood.  ABNORMAL PROTEIN: The abnormal protein in your blood is a condition called MGUS (monoclonal gammopathy of unknown significance).  This can be a precursor to a cancer called multiple myeloma.  I would like to consider doing a bone marrow biopsy to be able to diagnose whether or not you have cancer.  However, this is a lot to think about, so PLEASE BRING YOUR SON TO YOUR FOLLOW-UP VISIT IN 4 WEEKS, SO THAT THE THREE OF [place] CAN DISCUSS THIS TOGETHER BEFORE MAKING ANY DECISIONS.  LABS: Return in 4 weeks for repeat labs  MEDICATIONS: DECREASE the dose of your Hydrea.  Continue to take 1 tablet (500 mg) Monday through Friday.  DO NOT take any Hydrea on Saturday or Sunday.  FOLLOW-UP APPOINTMENT: Office visit in 4 weeks ([place])   Thank you for choosing [place] at [facility] to provide your oncology and hematology care.  To afford each patient quality time with our provider, please arrive at least 15 minutes before your scheduled appointment time.   If you have a lab appointment with the [facility] please come in thru the Main Entrance and check in at the main information desk.  You need to re-schedule your appointment should you arrive 10 or more minutes late.  We strive to give you quality time with our providers, and arriving  late affects you and other patients whose appointments are after yours.  Also, if you no show three or more times for appointments you may be dismissed from the clinic at the providers discretion.     Again, thank you for choosing [facility].  Our hope is that these requests will decrease the amount of time that you wait before being seen by our physicians.       _____________________________________________________________  Should you have questions after your visit to [facility], please contact our office at [phone] and follow the prompts.  Our office hours are 8:00 a.m. and 4:30 p.m. Monday - Friday.  Please note that voicemails left after 4:00 p.m. may not be returned until the following business day.  We are closed weekends and major holidays.  You do have access to a nurse 24-7, just call the main number to the clinic [phone] and do not press any options, hold on the line and a nurse will answer the phone.    For prescription refill requests, have your pharmacy contact our office and allow 72 hours.    Due to Covid, you will need to wear a mask upon entering the hospital. If you do not have a mask, a mask will be given to you at the Main Entrance upon arrival. For doctor visits, patients may have 1 support person age 29 or older with them. For treatment visits, patients can not have  anyone with them due to social distancing guidelines and our immunocompromised population.     

## 2020-10-03 DIAGNOSIS — I10 Essential (primary) hypertension: Secondary | ICD-10-CM | POA: Diagnosis not present

## 2020-10-03 DIAGNOSIS — C8330 Diffuse large B-cell lymphoma, unspecified site: Secondary | ICD-10-CM | POA: Diagnosis not present

## 2020-10-06 NOTE — Progress Notes (Signed)
[name], [name] [id number]   CLINIC:  Medical Oncology/Hematology  PCP:  Rosita Fire, MD [place] [address] [phone]   REASON FOR VISIT:  Follow-up for anemia, JAK2 positive essential thrombocytosis, MGUS   CURRENT THERAPY: Hydrea, oral iron, intermittent IV iron infusions  INTERVAL HISTORY:  [name] 87 y.o. female returns for routine follow-up of her JAK2 positive essential thrombocytosis, iron deficiency anemia, and MGUS.  She was last seen by [name] PA-C on 09/09/2020.  At today's visit, she reports feeling well.  No recent hospitalizations, surgeries, or changes in baseline health status.  At her last visit, her dose of Hydrea was decreased to 500 mg Monday through Friday (none on Saturday or Sunday), due to worsening anemia.  She denies any chest pain on exertion, shortness of breath on minimal exertion, presyncopal episodes, palpitations she has not had any recent epistaxis, hematemesis, hematochezia, or melena.  She denies erythromelalgia, aquagenic pruritus, vasomotor symptoms.  No new bone pains.  No dizziness, new onset hearing loss, blurry vision, neurologic deficits.  She denies any B symptoms such as fever, chills, night sweats, unintentional weight loss.  She does report intermittent tingling in her hands and feet, but states that she is not diabetic.  She is tolerating Hydrea well.  CBC shows expected macrocytosis.  Patient denies cutaneous ulcers and nonhealing skin wounds.  No complaints of oral ulcers.  Denies gastrointestinal symptoms such as gastritis, nausea, vomiting, diarrhea.  Energy levels have been good.  She has 90% energy and 100% appetite. She endorses that she is maintaining a stable weight.   REVIEW OF SYSTEMS:  Review of Systems  Constitutional:  Negative for appetite change, chills, diaphoresis, fatigue, fever and unexpected weight change.  HENT:   Negative for  lump/mass and nosebleeds.   Eyes:  Negative for eye problems.  Respiratory:  Negative for cough, hemoptysis and shortness of breath.   Cardiovascular:  Negative for chest pain, leg swelling and palpitations.  Gastrointestinal:  Negative for abdominal pain, blood in stool, constipation, diarrhea, nausea and vomiting.  Genitourinary:  Negative for hematuria.   Skin: Negative.   Neurological:  Positive for numbness. Negative for dizziness, headaches and light-headedness.  Hematological:  Does not bruise/bleed easily.     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   Vertigo, labyrinthine    Past Surgical History:  Procedure Laterality Date   CATARACT EXTRACTION W/PHACO Left 11/03/2015   Procedure: CATARACT EXTRACTION PHACO AND INTRAOCULAR LENS PLACEMENT ([place]);  Surgeon: Tonny Branch, MD;  Location: [facility];  Service: Ophthalmology;  Laterality: Left;  CDE: 7.74   CATARACT EXTRACTION W/PHACO Right 11/20/2015   Procedure: CATARACT EXTRACTION PHACO AND INTRAOCULAR LENS PLACEMENT ; CDE:  8.30;  Surgeon: Tonny Branch, MD;  Location: [facility];  Service: Ophthalmology;  Laterality: Right;   PORT-A-CATH REMOVAL  SOCIAL HISTORY:  Social History   Socioeconomic History   Marital status: Widowed    Spouse name: Not on file   Number of children: Not on file   Years of education: Not on file   Highest education level: Not on file  Occupational History   Not on file  Tobacco  Use   Smoking status: Former   Smokeless tobacco: Never  Vaping Use   Vaping Use: Never used  Substance and Sexual Activity   Alcohol use: No   Drug use: No   Sexual activity: Never  Other Topics Concern   Not on file  Social History Narrative   Not on file   Social Determinants of Health   Financial Resource Strain: Low Risk    Difficulty of Paying Living Expenses: Not hard at all  Food Insecurity: No Food Insecurity   Worried About Charity fundraiser in the Last Year: Never true   [place] in the Last Year: Never true  Transportation Needs: No Transportation Needs   Lack of Transportation (Medical): No   Lack of Transportation (Non-Medical): No  Physical Activity: Inactive   Days of Exercise per Week: 0 days   Minutes of Exercise per Session: 0 min  Stress: No Stress Concern Present   Feeling of Stress : Not at all  Social Connections: Moderately Isolated   Frequency of Communication with Friends and Family: More than three times a week   Frequency of Social Gatherings with Friends and Family: More than three times a week   Attends Religious Services: More than 4 times per year   Active Member of [employer] or Organizations: No   Attends Archivist Meetings: Never   Marital Status: Widowed  Human resources officer Violence: Not At Risk   Fear of Current or Ex-Partner: No   Emotionally Abused: No   Physically Abused: No   Sexually Abused: No    FAMILY HISTORY:  Family History  Problem Relation Age of Onset   Diabetes Mother     CURRENT MEDICATIONS:  Outpatient Encounter Medications as of 10/07/2020  Medication Sig Note   amLODipine (NORVASC) 2.5 MG tablet Take 2.5 mg by mouth daily. 01/10/2015: Received from: External Pharmacy Received Sig:    aspirin 81 MG tablet Take 81 mg by mouth daily.    atorvastatin (LIPITOR) 40 MG tablet Take 40 mg by mouth daily. 01/10/2015: Received from: External Pharmacy Received Sig:    benzonatate (TESSALON) 100 MG capsule  Take 1 capsule (100 mg total) by mouth 3 (three) times daily as needed for cough.    busPIRone (BUSPAR) 5 MG tablet Take 1 tablet (5 mg total) by mouth 2 (two) times daily.    cilostazol (PLETAL) 100 MG tablet Take 100 mg by mouth 2 (two) times daily.    diclofenac (CATAFLAM) 50 MG tablet Take 1 tablet (50 mg total) by mouth 2 (two) times daily.    ferrous sulfate 325 (65 FE) MG EC tablet Take 1 tablet (325 mg total) by mouth daily with breakfast.    gabapentin (NEURONTIN) 300 MG capsule Take by mouth.    hydrochlorothiazide (HYDRODIURIL) 12.5 MG tablet Take 12.5 mg by mouth daily.    hydroxyurea (HYDREA) 500 MG capsule TAKE 1 CAPSULE BY MOUTH DAILY (MAY TAKE WITH FOOD TO MINIMIZE GI SIDE EFFECTS)    ibuprofen (ADVIL,MOTRIN) 800 MG tablet Take 800 mg by mouth every 8 (eight) hours as needed for moderate pain. 03/11/2015: Received from: External Pharmacy   loratadine ([place])  10 MG tablet Take 10 mg by mouth daily.    losartan (COZAAR) 100 MG tablet Take 100 mg by mouth daily.    potassium chloride SA (KLOR-CON) 20 MEQ tablet Take 1 tablet (20 mEq total) by mouth daily.    temazepam (RESTORIL) 15 MG capsule Take 1 capsule (15 mg total) by mouth at bedtime as needed for sleep.    No facility-administered encounter medications on file as of 10/07/2020.    ALLERGIES:  Allergies  Allergen Reactions   Penicillins Rash     PHYSICAL EXAM:  ECOG PERFORMANCE STATUS: 0 - Asymptomatic  There were no vitals filed for this visit. There were no vitals filed for this visit. Physical Exam Constitutional:      Appearance: Normal appearance. She is obese.  HENT:     Head: Normocephalic and atraumatic.     Mouth/Throat:     Mouth: Mucous membranes are moist.  Eyes:     Extraocular Movements: Extraocular movements intact.     Pupils: Pupils are equal, round, and reactive to light.  Cardiovascular:     Rate and Rhythm: Normal rate and regular rhythm.     Pulses: Normal pulses.     Heart sounds:  Normal heart sounds.  Pulmonary:     Effort: Pulmonary effort is normal.     Breath sounds: Normal breath sounds.  Abdominal:     General: Bowel sounds are normal.     Palpations: Abdomen is soft.     Tenderness: There is no abdominal tenderness.  Musculoskeletal:        General: No swelling.     Right lower leg: No edema.     Left lower leg: No edema.  Lymphadenopathy:     Cervical: No cervical adenopathy.  Skin:    General: Skin is warm and dry.  Neurological:     General: No focal deficit present.     Mental Status: She is alert and oriented to person, place, and time.  Psychiatric:        Mood and Affect: Mood normal.        Behavior: Behavior normal.     LABORATORY DATA:  I have reviewed the labs as listed.  CBC    Component Value Date/Time   WBC 6.2 08/28/2020 1101   RBC 2.85 (L) 08/28/2020 1102   RBC 2.84 (L) 08/28/2020 1101   HGB 10.9 (L) 08/28/2020 1101   HCT 33.0 (L) 08/28/2020 1101   PLT 251 08/28/2020 1101   MCV 116.2 (H) 08/28/2020 1101   MCH 38.4 (H) 08/28/2020 1101   MCHC 33.0 08/28/2020 1101   RDW 17.4 (H) 08/28/2020 1101   LYMPHSABS 2.9 08/28/2020 1101   MONOABS 0.6 08/28/2020 1101   EOSABS 0.0 08/28/2020 1101   BASOSABS 0.0 08/28/2020 1101   CMP Latest Ref Rng & Units 08/28/2020 04/01/2020 12/24/2019  Glucose 70 - 99 mg/dL 90 113(H) 163(H)  BUN 8 - 23 mg/dL 24(H) 20 21  Creatinine 0.44 - 1.00 mg/dL 1.23(H) 1.08(H) 1.27(H)  Sodium 135 - 145 mmol/L 134(L) 136 137  Potassium 3.5 - 5.1 mmol/L 3.7 3.6 3.2(L)  Chloride 98 - 111 mmol/L 106 105 101  CO2 22 - 32 mmol/L _0 Calcium 8.9 - 10.3 mg/dL 8.9 9.0 9.0  Total Protein 6.5 - 8.1 g/dL 7.3 7.9 7.0  Total Bilirubin 0.3 - 1.2 mg/dL 0.7 0.5 0.8  Alkaline Phos 38 - 126 U/L 56 55 53  AST 15 - 41 U/L 14(L) 17 14(L)  ALT  0 - 44 U/L _0 DIAGNOSTIC IMAGING:  I have independently reviewed the relevant imaging and discussed with the patient.  ASSESSMENT & PLAN: 1.  JAK2 positive essential  thrombocytosis -Major manifestation is thrombocytosis.  She also has previous history of splenectomy. - Takes 81 mg aspirin daily - Currently taking Hydrea 500 mg Monday through Friday (decreased at last visit due to worsening anemia) -Tolerating hydrea well, no fatigue, GI symptoms, or skin sores  - Labs today (10/07/2020): Show platelets 289, Hgb 10.9 - PLAN: Decrease Hydrea to 500 mg every other day due to well-controlled platelets and persistent anemia.  2.  Iron deficiency anemia and anemia of CKD: -Patient has longstanding iron deficiency anemia, has been seen at this clinic for over 10 years -Intermittent Feraheme infusions, last given on 06/09/2020 and 06/16/2020 - Patient failed to improve on oral iron therapy - Patient had 1/3 stool occult blood cards positive, was referred to gastroenterology but has not yet followed up with them (scheduled for 10/09/2020).  Denies any gross rectal hemorrhage or melena.   - Patient has underlying CKD stage IIIa/IIIb with most recent GFR 53/creatinine 1.03, which is likely also contributing to anemia.  She does not see a nephrologist.   - Iron panel (08/29/2018) 27% iron saturation with ferritin 112 - Multifactorial anemia secondary to CKD, intermittent iron deficiency; may also be some suppressive effect from Hydrea. - Hydrea was decreased at last visit due to worsening anemia - Labs today (10/07/2020): Hgb 10.9 with MCV 117.1 - PLAN: We will decrease her Hydrea to 500 mg every other day due to persistent anemia.  We will consider ESA if Hgb drops to less than 10.  3.  MGUS, IgG lambda and IgM kappa -Myeloma panel checked on 06/05/2020 to investigate other possible causes of anemia revealed underlying MGUS:  SPEP showed M spike 1.7, IFE showed monoclonal protein of both IgG lambda and IgM kappa; kappa free light chain only mildly elevated at 32.0 with normal lambda free light chains 23.4; normal free light chain ratio 1.37; LDH elevated at 198, beta-2  microglobulin elevated at 2.8 - 24-hour urine IFE showed IgG kappa monoclonal protein, M spike not observed - Skeletal survey (06/12/2020): No lytic lesions or evidence of skeletal multiple myeloma - No new bone pains, does not meet C.R.A.B. criteria  - Most recent MGUS panel (08/28/2020): M spike 1.9, immunofixation once again shows IgG lambda as well as IgM kappa; light chains essentially unchanged with elevated kappa 35.2, normal lambda 26.0, normal ratio 1.35 - Labs today (10/07/2020): Hgb 10.9, creatinine 1.03, calcium 8.9; LDH normal - Discussed MGUS and myeloma spectrum with patient and her son, with 1% per year risk of progression to multiple myeloma.  However, due to involved IgM protein and M spike > 1.5, bone marrow biopsy is recommended to evaluate for possible smoldering myeloma or multiple myeloma.   - PLAN: Recommend bone marrow biopsy due to M-spike > 1.5 and possible oligosecretory nature of plasma cell dyscrasia.  However, patient and patient's son expressed that they would prefer watchful waiting with labs and have declined bone marrow biopsy at this time.  We will repeat MGUS panel at follow-up visit in 2 months.  4.  History of marginal zone lymphoma of the spleen: -Diagnosed in 1999, status post CHOP therapy x7 cycles followed by rituximab for 1 cycle. -Posttreatment CT scan showed complete response. -Status post splenectomy - No B symptoms at this time.  No palpable lymphadenopathy on exam.   -  PLAN: Continue monitoring with follow-up visits   5.  Anxiety: -Patient is taking BuSpar 5 mg twice daily - PLAN: Continue BuSpar as needed.   PLAN SUMMARY & DISPOSITION: - Decrease Hydrea to 500 mg every other day - Labs in 2 months - RTC after labs  All questions were answered. The patient knows to call the clinic with any problems, questions or concerns.  Medical decision making: Moderate  Time spent on visit: I spent 25 minutes counseling the patient face to face. The total  time spent in the appointment was 40 minutes and more than 50% was on counseling.   Harriett Rush, PA-C  10/07/2020 10:03 AM

## 2020-10-07 ENCOUNTER — Inpatient Hospital Stay (HOSPITAL_COMMUNITY): Payer: Medicare Other | Attending: Physician Assistant | Admitting: Physician Assistant

## 2020-10-07 ENCOUNTER — Inpatient Hospital Stay (HOSPITAL_COMMUNITY): Payer: Medicare Other

## 2020-10-07 ENCOUNTER — Other Ambulatory Visit (HOSPITAL_COMMUNITY): Payer: Medicare Other

## 2020-10-07 ENCOUNTER — Other Ambulatory Visit: Payer: Self-pay

## 2020-10-07 VITALS — HR 86 | Temp 98.7°F | Resp 16 | Wt 158.0 lb

## 2020-10-07 DIAGNOSIS — I739 Peripheral vascular disease, unspecified: Secondary | ICD-10-CM | POA: Diagnosis not present

## 2020-10-07 DIAGNOSIS — N1832 Chronic kidney disease, stage 3b: Secondary | ICD-10-CM | POA: Insufficient documentation

## 2020-10-07 DIAGNOSIS — D631 Anemia in chronic kidney disease: Secondary | ICD-10-CM | POA: Insufficient documentation

## 2020-10-07 DIAGNOSIS — F419 Anxiety disorder, unspecified: Secondary | ICD-10-CM | POA: Diagnosis not present

## 2020-10-07 DIAGNOSIS — E7800 Pure hypercholesterolemia, unspecified: Secondary | ICD-10-CM | POA: Diagnosis not present

## 2020-10-07 DIAGNOSIS — Z8542 Personal history of malignant neoplasm of other parts of uterus: Secondary | ICD-10-CM | POA: Insufficient documentation

## 2020-10-07 DIAGNOSIS — D471 Chronic myeloproliferative disease: Secondary | ICD-10-CM

## 2020-10-07 DIAGNOSIS — D473 Essential (hemorrhagic) thrombocythemia: Secondary | ICD-10-CM | POA: Diagnosis not present

## 2020-10-07 DIAGNOSIS — D472 Monoclonal gammopathy: Secondary | ICD-10-CM | POA: Insufficient documentation

## 2020-10-07 DIAGNOSIS — Z79899 Other long term (current) drug therapy: Secondary | ICD-10-CM | POA: Insufficient documentation

## 2020-10-07 DIAGNOSIS — D509 Iron deficiency anemia, unspecified: Secondary | ICD-10-CM | POA: Diagnosis not present

## 2020-10-07 DIAGNOSIS — I129 Hypertensive chronic kidney disease with stage 1 through stage 4 chronic kidney disease, or unspecified chronic kidney disease: Secondary | ICD-10-CM | POA: Insufficient documentation

## 2020-10-07 LAB — CBC WITH DIFFERENTIAL/PLATELET
Abs Immature Granulocytes: 0.01 10*3/uL (ref 0.00–0.07)
Basophils Absolute: 0 10*3/uL (ref 0.0–0.1)
Basophils Relative: 0 %
Eosinophils Absolute: 0 10*3/uL (ref 0.0–0.5)
Eosinophils Relative: 1 %
HCT: 32.2 % — ABNORMAL LOW (ref 36.0–46.0)
Hemoglobin: 10.9 g/dL — ABNORMAL LOW (ref 12.0–15.0)
Immature Granulocytes: 0 %
Lymphocytes Relative: 44 %
Lymphs Abs: 3.2 10*3/uL (ref 0.7–4.0)
MCH: 39.6 pg — ABNORMAL HIGH (ref 26.0–34.0)
MCHC: 33.9 g/dL (ref 30.0–36.0)
MCV: 117.1 fL — ABNORMAL HIGH (ref 80.0–100.0)
Monocytes Absolute: 0.8 10*3/uL (ref 0.1–1.0)
Monocytes Relative: 11 %
Neutro Abs: 3.2 10*3/uL (ref 1.7–7.7)
Neutrophils Relative %: 44 %
Platelets: 289 10*3/uL (ref 150–400)
RBC: 2.75 MIL/uL — ABNORMAL LOW (ref 3.87–5.11)
RDW: 17.8 % — ABNORMAL HIGH (ref 11.5–15.5)
WBC: 7.2 10*3/uL (ref 4.0–10.5)
nRBC: 0.7 % — ABNORMAL HIGH (ref 0.0–0.2)

## 2020-10-07 LAB — COMPREHENSIVE METABOLIC PANEL
ALT: 13 U/L (ref 0–44)
AST: 14 U/L — ABNORMAL LOW (ref 15–41)
Albumin: 3.3 g/dL — ABNORMAL LOW (ref 3.5–5.0)
Alkaline Phosphatase: 56 U/L (ref 38–126)
Anion gap: 7 (ref 5–15)
BUN: 20 mg/dL (ref 8–23)
CO2: 23 mmol/L (ref 22–32)
Calcium: 8.9 mg/dL (ref 8.9–10.3)
Chloride: 105 mmol/L (ref 98–111)
Creatinine, Ser: 1.03 mg/dL — ABNORMAL HIGH (ref 0.44–1.00)
GFR, Estimated: 53 mL/min — ABNORMAL LOW (ref >60)
Glucose, Bld: 118 mg/dL — ABNORMAL HIGH (ref 70–99)
Potassium: 3.5 mmol/L (ref 3.5–5.1)
Sodium: 135 mmol/L (ref 135–145)
Total Bilirubin: 0.8 mg/dL (ref 0.3–1.2)
Total Protein: 7.6 g/dL (ref 6.5–8.1)

## 2020-10-07 LAB — LACTATE DEHYDROGENASE: LDH: 144 U/L (ref 98–192)

## 2020-10-07 NOTE — Patient Instructions (Addendum)
[facility] Discharge Instructions  You were seen today by [name] PA-C for your essential thrombocytosis, anemia, and MGUS.  ESSENTIAL THROMBOCYTOSIS: As we discussed, your "essential thrombocytosis" means that you have elevated platelets which is caused by a genetic mutation.  We are treating you with Hydrea, which helps to decrease your platelets.  Your platelets are within a normal range at this time, so we are going to DECREASE the dose of your Hydrea to every other day.  ANEMIA: Your blood counts are slightly lower than normal.  This may be secondary to your Hydrea, which is why we are going to decrease the dose of your Hydrea.  This may also be related to your mild chronic kidney disease and any possible bleeding that has gone unnoticed.  MGUS: This is also called "monoclonal gammopathy of unknown significance," and refers to the abnormal proteins that we discussed.  MGUS can transition into a type of cancer called multiple myeloma, but the only way to know where you are at on this disease spectrum is to get a bone marrow biopsy.  We discussed this extensively during your visit, and you have expressed you prefer to watch and wait by monitoring lab values rather than pursuing a bone marrow biopsy at this time.  LABS: Return in 2 months for repeat labs  OTHER TESTS: None at this time  MEDICATIONS: You will need to DECREASE the dose of your hydroxyurea (Hydrea) to 1 tablet (500 mg) every other day.  FOLLOW-UP APPOINTMENT: Office visit in 2 months (a week after labs)   Thank you for choosing [place] at [facility] to provide your oncology and hematology care.  To afford each patient quality time with our provider, please arrive at least 15 minutes before your scheduled appointment time.   If you have a lab appointment with the [facility] please come in thru the Main Entrance and check in at the main information  desk.  You need to re-schedule your appointment should you arrive 10 or more minutes late.  We strive to give you quality time with our providers, and arriving late affects you and other patients whose appointments are after yours.  Also, if you no show three or more times for appointments you may be dismissed from the clinic at the providers discretion.     Again, thank you for choosing [facility].  Our hope is that these requests will decrease the amount of time that you wait before being seen by our physicians.       _____________________________________________________________  Should you have questions after your visit to [facility], please contact our office at [phone] and follow the prompts.  Our office hours are 8:00 a.m. and 4:30 p.m. Monday - Friday.  Please note that voicemails left after 4:00 p.m. may not be returned until the following business day.  We are closed weekends and major holidays.  You do have access to a nurse 24-7, just call the main number to the clinic [phone] and do not press any options, hold on the line and a nurse will answer the phone.    For prescription refill requests, have your pharmacy contact our office and allow 72 hours.    Due to Covid, you will need to wear a mask upon entering the hospital. If you do not have a mask, a mask will be given to you at the Main Entrance upon arrival. For doctor  visits, patients may have 1 support person age 41 or older with them. For treatment visits, patients can not have anyone with them due to social distancing guidelines and our immunocompromised population.

## 2020-10-09 ENCOUNTER — Encounter (INDEPENDENT_AMBULATORY_CARE_PROVIDER_SITE_OTHER): Payer: Self-pay | Admitting: Gastroenterology

## 2020-10-09 ENCOUNTER — Ambulatory Visit (INDEPENDENT_AMBULATORY_CARE_PROVIDER_SITE_OTHER): Payer: Medicare Other | Admitting: Gastroenterology

## 2020-10-14 ENCOUNTER — Ambulatory Visit (HOSPITAL_COMMUNITY): Payer: Medicare Other | Admitting: Hematology

## 2020-10-23 ENCOUNTER — Other Ambulatory Visit: Payer: Self-pay

## 2020-10-23 ENCOUNTER — Encounter (INDEPENDENT_AMBULATORY_CARE_PROVIDER_SITE_OTHER): Payer: Self-pay | Admitting: Gastroenterology

## 2020-10-23 ENCOUNTER — Ambulatory Visit (INDEPENDENT_AMBULATORY_CARE_PROVIDER_SITE_OTHER): Payer: Medicare Other | Admitting: Gastroenterology

## 2020-10-23 VITALS — BP 149/75 | HR 80 | Temp 99.1°F | Ht 61.0 in | Wt 157.0 lb

## 2020-10-23 DIAGNOSIS — R195 Other fecal abnormalities: Secondary | ICD-10-CM

## 2020-10-23 DIAGNOSIS — D509 Iron deficiency anemia, unspecified: Secondary | ICD-10-CM

## 2020-10-23 NOTE — Progress Notes (Signed)
Primary Care Physician:  Rosita Fire, MD Primary Gastroenterologist:  Dr.   Laurel Dimmer Complaint  Patient presents with   Blood In Stools    Pt arrives today for positive stool card test. Has not noticed any blood. Has one bm a day and eats good    HPI:   Leslie Williamson is a 87 y.o. 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 presenting today at the request of [name] with oncology.   Longstanding history of IDA, seen by oncology for over 10 years. Intermittent Feraheme infusions last on 06/16/20 as she failed to improve on oral iron. She had 1/3 stool cards positive but denies any melena or rectal bleeding. Currently on Hydrea, oncology questioned if this could be playing a role in her anemia, dose was recently decreased. Last hemoglobin 10.9 on 10/07/20. Iron, TIBC and Ferritin 08/28/20 were WNL.   She denies any shortness of breath, she endorses some chronic fatigue, she denies dizziness, hematochezia or melena. She denies abdominal pain or postprandial discomfort. She reports she has decreased appetite over the past two years after she cut back on eating because of worries of diabetes. Denies NSAID use. Denies any blood thinner. Denies constipation or diarrhea. No early satiety, no unintentional weight loss.   Last colonoscopy: thinks it was 5 years ago, unable to review records Last EGD: unknown  Family history: patient's sister had CRC in her 61s.  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   Vertigo, labyrinthine     Past Surgical History:  Procedure Laterality Date   CATARACT EXTRACTION W/PHACO Left 11/03/2015   Procedure: CATARACT EXTRACTION PHACO AND INTRAOCULAR LENS PLACEMENT ([place]);  Surgeon: Tonny Branch, MD;  Location: [facility];  Service: Ophthalmology;  Laterality: Left;  CDE: 7.74   CATARACT EXTRACTION W/PHACO Right 11/20/2015   Procedure: CATARACT EXTRACTION PHACO AND INTRAOCULAR LENS PLACEMENT ; CDE:  8.30;  Surgeon: Tonny Branch, MD;  Location: [facility];  Service: Ophthalmology;  Laterality: Right;   PORT-A-CATH REMOVAL      Current Outpatient Medications  Medication Sig Dispense Refill   amLODipine (NORVASC) 2.5 MG tablet Take 2.5 mg by mouth daily.  2   aspirin 81 MG tablet Take 81 mg by mouth daily.     atorvastatin (LIPITOR) 40 MG tablet Take 40 mg by mouth daily.  2   cilostazol (PLETAL) 100 MG tablet Take 100 mg by mouth 2 (two) times daily.     hydrochlorothiazide (HYDRODIURIL) 12.5 MG tablet Take 12.5 mg by mouth daily.     hydroxyurea (HYDREA) 500 MG capsule TAKE 1 CAPSULE BY MOUTH DAILY (MAY TAKE WITH FOOD TO MINIMIZE GI SIDE EFFECTS) 90 capsule 3   losartan (COZAAR) 100 MG tablet Take 100 mg by mouth daily.     potassium chloride  SA (KLOR-CON) 20 MEQ tablet Take 1 tablet (20 mEq total) by mouth daily. 90 tablet 0   busPIRone (BUSPAR) 5 MG tablet Take 1 tablet (5 mg total) by mouth 2 (two) times daily. (Patient not taking: Reported on 10/23/2020) 90 tablet 1   diclofenac (CATAFLAM) 50 MG tablet Take 1 tablet (50 mg total) by mouth 2 (two) times daily. (Patient not taking: Reported on 10/23/2020) 60 tablet 2   ferrous sulfate 325 (65 FE) MG EC tablet Take 1 tablet (325 mg total) by mouth daily with breakfast. (Patient not taking: Reported on 10/23/2020) 90 tablet 3   gabapentin (NEURONTIN) 300 MG capsule Take by mouth. (Patient  not taking: Reported on 10/23/2020)     ibuprofen (ADVIL,MOTRIN) 800 MG tablet Take 800 mg by mouth every 8 (eight) hours as needed for moderate pain. (Patient not taking: Reported on 10/23/2020)  0   loratadine (CLARITIN) 10 MG tablet Take 10 mg by mouth daily. (Patient not taking: Reported on 10/23/2020)     temazepam (RESTORIL) 15 MG capsule Take 1 capsule (15 mg total) by mouth at bedtime as needed for sleep. (Patient not taking: Reported on 10/23/2020) 30 capsule 0   No current facility-administered medications for this visit.    Allergies as of 10/23/2020 - Review Complete 10/23/2020  Allergen Reaction Noted   Penicillins Rash     Family History  Problem Relation Age of Onset   Diabetes Mother     Social History   Socioeconomic History   Marital status: Widowed    Spouse name: Not on file   Number of children: Not on file   Years of education: Not on file   Highest education level: Not on file  Occupational History   Not on file  Tobacco Use   Smoking status: Former   Smokeless tobacco: Never  Vaping Use   Vaping Use: Never used  Substance and Sexual Activity   Alcohol use: No   Drug use: No   Sexual activity: Never  Other Topics Concern   Not on file  Social History Narrative   Not on file   Social Determinants of Health   Financial Resource Strain: Low Risk    Difficulty of Paying Living Expenses: Not hard at all  Food Insecurity: No Food Insecurity   Worried About Charity fundraiser in the Last Year: Never true   [place] in the Last Year: Never true  Transportation Needs: No Transportation Needs   Lack of Transportation (Medical): No   Lack of Transportation (Non-Medical): No  Physical Activity: Inactive   Days of Exercise per Week: 0 days   Minutes of Exercise per Session: 0 min  Stress: No Stress Concern Present   Feeling of Stress : Not at all  Social Connections: Moderately Isolated   Frequency of Communication with Friends and Family: More than  three times a week   Frequency of Social Gatherings with Friends and Family: More than three times a week   Attends Religious Services: More than 4 times per year   Active Member of [employer] or Organizations: No   Attends Archivist Meetings: Never   Marital Status: Widowed  Human resources officer Violence: Not At Risk   Fear of Current or Ex-Partner: No   Emotionally Abused: No   Physically Abused: No   Sexually Abused: No    Review of Systems: Gen: Denies any fever, chills, weight loss, lack of appetite. +chronic fatigue CV: Denies chest pain, heart palpitations, peripheral  edema, syncope.  Resp: Denies shortness of breath at rest or with exertion. Denies wheezing or cough.  GI: Denies dysphagia or odynophagia. Denies jaundice, hematemesis, fecal incontinence. GU : Denies urinary burning, urinary frequency, urinary hesitancy MS: Denies joint pain, muscle weakness, cramps, or limitation of movement.  Derm: Denies rash, itching, dry skin Psych: Denies depression, anxiety, memory loss, and confusion Heme: Denies bruising, bleeding, and enlarged lymph nodes.  Physical Exam: BP (!) 149/75 (BP Location: Right Arm, Patient Position: Sitting, Cuff Size: Large)   Pulse 80   Temp 99.1 F (37.3 C) (Oral)   Ht '5\' 1"'$  (1.549 m)   Wt 157 lb (71.2 kg)   BMI 29.66 kg/m  General:   Alert and oriented. Pleasant and cooperative. Well-nourished and well-developed.  Head:  Normocephalic and atraumatic. Eyes:  Without icterus, sclera clear and conjunctiva pink.  Ears:  Normal auditory acuity. Mouth:  No deformity or lesions, oral mucosa pink.  Lungs:  Clear to auscultation bilaterally. No wheezes, rales, or rhonchi. No distress.  Heart:  S1, S2 present without murmurs appreciated.  Abdomen:  +BS, soft, non-tender and non-distended. No HSM noted. No guarding or rebound. No masses appreciated.  Rectal:  Deferred  Msk:  Symmetrical without gross deformities. Normal posture. Extremities:  Without  edema. Neurologic:  Alert and  oriented x4;  grossly normal neurologically. Skin:  Intact without significant lesions or rashes. Psych:  Alert and cooperative. Normal mood and affect.  ASSESSMENT: Leslie Williamson is a 87 y.o. female presenting today at the request of [name] with oncology after patient had 1/3 positive FOBT. Patient denies weight loss, sob, dizziness, melena, hematochezia, abdominal pain, early satiety, dysphagia, odynophagia, nausea or vomiting. She reports some fatigue at times, however this is not new. She has a longstanding history of IDA. Most recent hgb 10/07/20 was 10.9, fairly consistently around this for the past 11 months, was 12.7 in Nov 2021, however, patient is no longer receiving IV feraheme as last dose was in April, most recent iron studies in July 2022 were WNL. Patient denies any NSAID use. She states last colonoscopy was about 5 years ago and she thinks it was normal, I am not able to review these records today.  I suspect that 1/3 positive FOBT stool cards could have resulted from a false positive, given patient's benign presentation. I discussed with the patient and her niece who accompanied her, that given her advanced age and lack of any associated symptoms, we could either proceed with cologuard to further evaluate for any possible cancer (if positive, we would need to proceed with further eval with egd/colonoscopy) or we could do EGD/Colonoscopy to get visualization of her GI tract and rule out not only possible malignancy but other causes of GI blood loss such as PUD, duodenitis, gastritis, AVMs. Options and indications were discussed in depth with patient and niece. All questions were answered. Patient states she would like to take the weekend to think about which way she would like to proceed.    PLAN: Cologuard vs double, patient to let us know her decision on Tuesday Continue to monitor for melena, hematochezia Please let us know if you have  sob, dizziness, worsening fatigue, or syncope   Follow up 3 months  Tonya Carlile L. Alver Sorrow, MSN, APRN, AGNP-C Adult-Gerontology Nurse Practitioner [facility]

## 2020-10-23 NOTE — Patient Instructions (Addendum)
As we discussed, we can do a cologuard where you send a stool sample that is tested for possible cancer cells in your GI tract, however, this will not tell us about any other types of cancer or sources of blood loss. Or we can proceed with an EGD and colonoscopy to further evaluate for ANY sources of bleeding in your entire GI tract to include a possible cancer, ulcer, inflammation, etc.  Please call us Tuesday to let us know how you would like to proceed.  Please let us know if you have any black stools or blood in your stools, shortness of breath, worsening fatigue, dizziness or episodes of passing out.  Follow up 3 months

## 2020-11-03 DIAGNOSIS — D471 Chronic myeloproliferative disease: Secondary | ICD-10-CM | POA: Diagnosis not present

## 2020-11-03 DIAGNOSIS — I10 Essential (primary) hypertension: Secondary | ICD-10-CM | POA: Diagnosis not present

## 2020-12-08 ENCOUNTER — Inpatient Hospital Stay (HOSPITAL_COMMUNITY): Payer: Medicare Other | Attending: Hematology

## 2020-12-08 DIAGNOSIS — C8580 Other specified types of non-Hodgkin lymphoma, unspecified site: Secondary | ICD-10-CM | POA: Diagnosis not present

## 2020-12-08 DIAGNOSIS — I10 Essential (primary) hypertension: Secondary | ICD-10-CM | POA: Diagnosis not present

## 2020-12-08 DIAGNOSIS — Z79899 Other long term (current) drug therapy: Secondary | ICD-10-CM | POA: Insufficient documentation

## 2020-12-08 DIAGNOSIS — N390 Urinary tract infection, site not specified: Secondary | ICD-10-CM | POA: Diagnosis not present

## 2020-12-08 DIAGNOSIS — D472 Monoclonal gammopathy: Secondary | ICD-10-CM

## 2020-12-08 DIAGNOSIS — D473 Essential (hemorrhagic) thrombocythemia: Secondary | ICD-10-CM

## 2020-12-08 DIAGNOSIS — D509 Iron deficiency anemia, unspecified: Secondary | ICD-10-CM | POA: Insufficient documentation

## 2020-12-08 LAB — CBC WITH DIFFERENTIAL/PLATELET
Abs Immature Granulocytes: 0.01 10*3/uL (ref 0.00–0.07)
Basophils Absolute: 0 10*3/uL (ref 0.0–0.1)
Basophils Relative: 0 %
Eosinophils Absolute: 0 10*3/uL (ref 0.0–0.5)
Eosinophils Relative: 0 %
HCT: 32.4 % — ABNORMAL LOW (ref 36.0–46.0)
Hemoglobin: 10.6 g/dL — ABNORMAL LOW (ref 12.0–15.0)
Immature Granulocytes: 0 %
Lymphocytes Relative: 43 %
Lymphs Abs: 2.9 10*3/uL (ref 0.7–4.0)
MCH: 38 pg — ABNORMAL HIGH (ref 26.0–34.0)
MCHC: 32.7 g/dL (ref 30.0–36.0)
MCV: 116.1 fL — ABNORMAL HIGH (ref 80.0–100.0)
Monocytes Absolute: 0.7 10*3/uL (ref 0.1–1.0)
Monocytes Relative: 10 %
Neutro Abs: 3 10*3/uL (ref 1.7–7.7)
Neutrophils Relative %: 47 %
Platelets: 309 10*3/uL (ref 150–400)
RBC: 2.79 MIL/uL — ABNORMAL LOW (ref 3.87–5.11)
RDW: 15.9 % — ABNORMAL HIGH (ref 11.5–15.5)
WBC: 6.6 10*3/uL (ref 4.0–10.5)
nRBC: 0.5 % — ABNORMAL HIGH (ref 0.0–0.2)

## 2020-12-08 LAB — LACTATE DEHYDROGENASE: LDH: 122 U/L (ref 98–192)

## 2020-12-08 LAB — COMPREHENSIVE METABOLIC PANEL
ALT: 10 U/L (ref 0–44)
AST: 13 U/L — ABNORMAL LOW (ref 15–41)
Albumin: 3.2 g/dL — ABNORMAL LOW (ref 3.5–5.0)
Alkaline Phosphatase: 52 U/L (ref 38–126)
Anion gap: 4 — ABNORMAL LOW (ref 5–15)
BUN: 18 mg/dL (ref 8–23)
CO2: 26 mmol/L (ref 22–32)
Calcium: 8.7 mg/dL — ABNORMAL LOW (ref 8.9–10.3)
Chloride: 108 mmol/L (ref 98–111)
Creatinine, Ser: 1.05 mg/dL — ABNORMAL HIGH (ref 0.44–1.00)
GFR, Estimated: 51 mL/min — ABNORMAL LOW (ref >60)
Glucose, Bld: 108 mg/dL — ABNORMAL HIGH (ref 70–99)
Potassium: 3.5 mmol/L (ref 3.5–5.1)
Sodium: 138 mmol/L (ref 135–145)
Total Bilirubin: 0.2 mg/dL — ABNORMAL LOW (ref 0.3–1.2)
Total Protein: 7.4 g/dL (ref 6.5–8.1)

## 2020-12-08 LAB — IRON AND TIBC
Iron: 68 ug/dL (ref 28–170)
Saturation Ratios: 25 % (ref 10.4–31.8)
TIBC: 276 ug/dL (ref 250–450)
UIBC: 208 ug/dL

## 2020-12-08 LAB — FERRITIN: Ferritin: 51 ng/mL (ref 11–307)

## 2020-12-09 LAB — KAPPA/LAMBDA LIGHT CHAINS
Kappa free light chain: 31.9 mg/L — ABNORMAL HIGH (ref 3.3–19.4)
Kappa, lambda light chain ratio: 1.39 (ref 0.26–1.65)
Lambda free light chains: 23 mg/L (ref 5.7–26.3)

## 2020-12-10 LAB — PROTEIN ELECTROPHORESIS, SERUM
A/G Ratio: 0.8 (ref 0.7–1.7)
Albumin ELP: 3.2 g/dL (ref 2.9–4.4)
Alpha-1-Globulin: 0.2 g/dL (ref 0.0–0.4)
Alpha-2-Globulin: 0.6 g/dL (ref 0.4–1.0)
Beta Globulin: 0.9 g/dL (ref 0.7–1.3)
Gamma Globulin: 2.2 g/dL — ABNORMAL HIGH (ref 0.4–1.8)
Globulin, Total: 3.9 g/dL (ref 2.2–3.9)
M-Spike, %: 2 g/dL — ABNORMAL HIGH
Total Protein ELP: 7.1 g/dL (ref 6.0–8.5)

## 2020-12-14 NOTE — Progress Notes (Addendum)
Virtual Visit via Telephone Note [facility]  I connected with Edwin Dada  on 12/15/2020 at 11:34 AM by telephone and verified that I am speaking with the correct person using two identifiers.  Location: Patient: Home Provider: [facility]   I discussed the limitations, risks, security and privacy concerns of performing an evaluation and management service by telephone and the availability of in person appointments. I also discussed with the patient that there may be a patient responsible charge related to this service. The patient expressed understanding and agreed to proceed.   REASON FOR VISIT:  Follow-up for anemia, JAK2 positive essential thrombocytosis, MGUS   CURRENT THERAPY: Hydrea, oral iron, intermittent IV iron infusions   HISTORY OF PRESENT ILLNESS: [name] 87 y.o. female returns for routine follow-up of her JAK2 positive essential thrombocytosis, iron deficiency anemia, and MGUS.  She was last seen by [name] PA-C on 10/07/2020.   At today's visit, she reports feeling "very well," and she denies any acute complaints at today's visit.  No recent hospitalizations, surgeries, or changes in baseline health status.    At her last visit, patient was instructed to change her Hydrea to M/W/F, but she reports that she is still been taking it daily Monday through Friday. Patient denies cutaneous ulcers and nonhealing skin wounds.  No complaints of mouth sores.  Denies gastrointestinal symptoms such as gastritis, nausea, vomiting, diarrhea.  Energy levels have been good.  She denies any chest pain on exertion, shortness of breath on minimal exertion, presyncopal episodes, palpitations. She has not had any recent epistaxis, hematemesis, hematochezia, or melena. She denies erythromelalgia, aquagenic pruritus, vasomotor symptoms. No new bone pains.  No dizziness, new onset hearing loss, blurry vision, neurologic deficits.  She does have  some chronic intermittent tingling in her hands and feet, but states that she is not diabetic; this has been no worse than usual. She denies any B symptoms such as fever, chills, night sweats, unintentional weight loss.  She has 100% energy and 100% appetite. She endorses that she is maintaining a stable weight.      OBSERVATIONS/OBJECTIVE: Review of Systems  Constitutional:  Negative for chills, diaphoresis, fever, malaise/fatigue and weight loss.  Respiratory:  Negative for cough and shortness of breath.   Cardiovascular:  Negative for chest pain and palpitations.  Gastrointestinal:  Negative for abdominal pain, blood in stool, melena, nausea and vomiting.  Neurological:  Negative for dizziness and headaches.    PHYSICAL EXAM (per limitations of virtual telephone visit): The patient is alert and oriented x 3, exhibiting adequate mentation, good mood, and ability to speak in full sentences and execute sound judgement.   ASSESSMENT & PLAN: 1.  JAK2 positive essential thrombocytosis - Major manifestation is thrombocytosis.  She also has previous history of splenectomy. - Takes 81 mg aspirin daily - Currently taking Hydrea 500 mg Monday through Friday - Tolerating Hydrea well, no fatigue, GI symptoms, or skin sores  - Most recent labs (12/08/2020): Platelets 309, WBC 6.6, Hgb 10.6.  LDH normal. - PLAN:  We will decrease Hydrea to 500 mg M/W/F due to well-controlled platelets and persistent anemia. - Decreased need for Hydrea may indicate progression towards post-ET myelofibrosis.  Would consider bone marrow biopsy, but patient has refused this. - Repeat CBC and RTC in 6 weeks.   2.  Iron deficiency anemia and anemia of CKD: - Patient has longstanding iron deficiency anemia, has been seen at this clinic for over 10 years -  Intermittent Venofer infusions, last given on 06/09/2020 and 06/16/2020 - Patient failed to improve on oral iron therapy - Patient had 1/3 stool occult blood cards  positive, was referred to gastroenterology who did not feel any EGD/colonoscopy was necessary at this time. - Denies any gross rectal hemorrhage or melena.   - Patient has underlying CKD stage IIIa/IIIb with most recent GFR 51/creatinine 1.05, which is likely also contributing to anemia.  She does not see a nephrologist.   - Iron panel (12/08/2020): Iron saturation 25%, normal TIBC, ferritin 51 - Multifactorial anemia secondary to CKD, intermittent iron deficiency; may also be some suppressive effect from Hydrea. - Hydrea was decreased at last visit due to worsening anemia - Most recent labs (12/08/2020): Hgb 10.6/MCV 116.1 - PLAN: Recommend IV Venofer 400 mg x2 due to marginal ferritin and persistent anemia. - We will t decrease Hydrea to 500 mg M/W/F due to persistent anemia to see if her hemoglobin improves.  We will consider ESA if Hgb drops to less than 10. - Repeat CBC and RTC in 6 weeks.   3.  MGUS, IgG lambda and IgM kappa -Myeloma panel checked on 06/05/2020 to investigate other possible causes of anemia revealed underlying MGUS:  SPEP showed M spike 1.7, IFE showed monoclonal protein of both IgG lambda and IgM kappa; kappa free light chain only mildly elevated at 32.0 with normal lambda free light chains 23.4; normal free light chain ratio 1.37; LDH elevated at 198, beta-2 microglobulin elevated at 2.8 - 24-hour urine IFE showed IgG kappa monoclonal protein, M spike not observed - Skeletal survey (06/12/2020): No lytic lesions or evidence of skeletal multiple myeloma - No new bone pains, or B symptoms.   She does have some intermittent peripheral neuropathy.   - Does not meet CRAB criteria at this time, with most recent labs (12/08/2020): Hgb 10.6, creatinine 1.05, calcium 8.7 - Most recent MGUS panel (12/08/2020): M spike trending upwards at 2.0; mildly elevated kappa (31.9) with normal lambda (23.0) and normal free light chain ratio (1.39). - Discussed MGUS and myeloma spectrum with  patient and her son, with 1% per year risk of progression to multiple myeloma.  However, due to involved IgM protein and M spike > 1.5, bone marrow biopsy is recommended to evaluate for possible smoldering myeloma or multiple myeloma.   - PLAN: I have previously discussed at length with the patient and her son regarding recommended bone marrow biopsy due to M-spike > 1.5 and possible oligosecretory nature of plasma cell dyscrasia.  However, patient and patient's son expressed that they would prefer watchful waiting with labs and have declined bone marrow biopsy at this time.  (See note dated 10/07/2020). - I continue to recommend bone marrow biopsy at this visit, especially due to increasing M spike and worsening anemia, but patient continues to decline. - We will repeat MGUS panel at NEXT follow-up visit in 3 months.   4.  History of marginal zone lymphoma of the spleen: - Diagnosed in 1999, status post CHOP therapy x7 cycles followed by rituximab for 1 cycle. - Posttreatment CT scan showed complete response. - Status post splenectomy - No B symptoms at this time.  No palpable lymphadenopathy on exam.   - PLAN: Continue monitoring with follow-up visits   5.  Anxiety: -Patient is taking BuSpar 5 mg twice daily - PLAN: Continue BuSpar as needed.   FOLLOW UP INSTRUCTIONS: - IV Venofer 400 mg x2 - Decrease Hydrea to 500 mg M/W/F - Repeat labs and  RTC in 6 weeks (CBC, CMP, LDH, iron, B12/methylmalonic acid, folate)    I discussed the assessment and treatment plan with the patient. The patient was provided an opportunity to ask questions and all were answered. The patient agreed with the plan and demonstrated an understanding of the instructions.   The patient was advised to call back or seek an in-person evaluation if the symptoms worsen or if the condition fails to improve as anticipated.  I provided 22 minutes of non-face-to-face time during this encounter.  Harriett Rush,  PA-C 12/15/2020 12:08 PM

## 2020-12-15 ENCOUNTER — Inpatient Hospital Stay (HOSPITAL_BASED_OUTPATIENT_CLINIC_OR_DEPARTMENT_OTHER): Payer: Medicare Other | Admitting: Physician Assistant

## 2020-12-15 DIAGNOSIS — D471 Chronic myeloproliferative disease: Secondary | ICD-10-CM | POA: Diagnosis not present

## 2020-12-15 DIAGNOSIS — D509 Iron deficiency anemia, unspecified: Secondary | ICD-10-CM | POA: Diagnosis not present

## 2020-12-15 DIAGNOSIS — D539 Nutritional anemia, unspecified: Secondary | ICD-10-CM | POA: Diagnosis not present

## 2020-12-18 ENCOUNTER — Other Ambulatory Visit: Payer: Self-pay

## 2020-12-18 ENCOUNTER — Inpatient Hospital Stay (HOSPITAL_COMMUNITY): Payer: Medicare Other

## 2020-12-18 VITALS — BP 127/59 | HR 65 | Temp 96.8°F | Resp 18

## 2020-12-18 DIAGNOSIS — D509 Iron deficiency anemia, unspecified: Secondary | ICD-10-CM

## 2020-12-18 DIAGNOSIS — Z79899 Other long term (current) drug therapy: Secondary | ICD-10-CM | POA: Diagnosis not present

## 2020-12-18 MED ORDER — LORATADINE 10 MG PO TABS
10.0000 mg | ORAL_TABLET | Freq: Once | ORAL | Status: AC
  Administered 2020-12-18: 10 mg via ORAL
  Filled 2020-12-18: qty 1

## 2020-12-18 MED ORDER — SODIUM CHLORIDE 0.9 % IV SOLN: Freq: Once | INTRAVENOUS | Status: AC

## 2020-12-18 MED ORDER — ACETAMINOPHEN 325 MG PO TABS
650.0000 mg | ORAL_TABLET | Freq: Once | ORAL | Status: AC
  Administered 2020-12-18: 650 mg via ORAL
  Filled 2020-12-18: qty 2

## 2020-12-18 MED ORDER — SODIUM CHLORIDE 0.9 % IV SOLN
400.0000 mg | Freq: Once | INTRAVENOUS | Status: AC
  Administered 2020-12-18: 400 mg via INTRAVENOUS
  Filled 2020-12-18: qty 20

## 2020-12-18 NOTE — Patient Instructions (Signed)
[facility]  Discharge Instructions: Thank you for choosing [facility] to provide your oncology and hematology care.  If you have a lab appointment with the Cancer Center, please come in thru the Main Entrance and check in at the main information desk.  Wear comfortable clothing and clothing appropriate for easy access to any Portacath or PICC line.   We strive to give you quality time with your provider. You may need to reschedule your appointment if you arrive late (15 or more minutes).  Arriving late affects you and other patients whose appointments are after yours.  Also, if you miss three or more appointments without notifying the office, you may be dismissed from the clinic at the provider's discretion.      For prescription refill requests, have your pharmacy contact our office and allow 72 hours for refills to be completed.    Today you received the following chemotherapy and/or immunotherapy agents Venofer      To help prevent nausea and vomiting after your treatment, we encourage you to take your nausea medication as directed.  BELOW ARE SYMPTOMS THAT SHOULD BE REPORTED IMMEDIATELY: *FEVER GREATER THAN 100.4 F (38 C) OR HIGHER *CHILLS OR SWEATING *NAUSEA AND VOMITING THAT IS NOT CONTROLLED WITH YOUR NAUSEA MEDICATION *UNUSUAL SHORTNESS OF BREATH *UNUSUAL BRUISING OR BLEEDING *URINARY PROBLEMS (pain or burning when urinating, or frequent urination) *BOWEL PROBLEMS (unusual diarrhea, constipation, pain near the anus) TENDERNESS IN MOUTH AND THROAT WITH OR WITHOUT PRESENCE OF ULCERS (sore throat, sores in mouth, or a toothache) UNUSUAL RASH, SWELLING OR PAIN  UNUSUAL VAGINAL DISCHARGE OR ITCHING   Items with * indicate a potential emergency and should be followed up as soon as possible or go to the Emergency Department if any problems should occur.  Please show the CHEMOTHERAPY ALERT CARD or IMMUNOTHERAPY ALERT CARD at check-in to the Emergency  Department and triage nurse.  Should you have questions after your visit or need to cancel or reschedule your appointment, please contact [facility] [phone]  and follow the prompts.  Office hours are 8:00 a.m. to 4:30 p.m. Monday - Friday. Please note that voicemails left after 4:00 p.m. may not be returned until the following business day.  We are closed weekends and major holidays. You have access to a nurse at all times for urgent questions. Please call the main number to the clinic [phone] and follow the prompts.  For any non-urgent questions, you may also contact your provider using MyChart. We now offer e-Visits for anyone 18 and older to request care online for non-urgent symptoms. For details visit [email or web address].   Also download the MyChart app! Go to the app store, search "MyChart", open the app, select [facility], and log in with your MyChart username and password.  Due to Covid, a mask is required upon entering the hospital/clinic. If you do not have a mask, one will be given to you upon arrival. For doctor visits, patients may have 1 support person aged 18 or older with them. For treatment visits, patients cannot have anyone with them due to current Covid guidelines and our immunocompromised population.  

## 2020-12-18 NOTE — Progress Notes (Signed)
Patient presents today for Venofer 400 mg infusion per providers order.  Vital signs WNL.  Patient has no new complaints at this time.  Peripheral IV started and blood return noted pre and post infusion.  Venofer infusion given today per MD orders.  Stable during infusion without adverse affects.  Vital signs stable.  No complaints at this time.  Discharge from clinic ambulatory in stable condition.  Alert and oriented X 3.  Follow up with [facility] as scheduled.

## 2020-12-25 ENCOUNTER — Inpatient Hospital Stay (HOSPITAL_COMMUNITY): Payer: Medicare Other | Attending: Physician Assistant

## 2020-12-25 ENCOUNTER — Other Ambulatory Visit: Payer: Self-pay

## 2020-12-25 VITALS — BP 118/59 | HR 76 | Temp 96.9°F | Resp 18

## 2020-12-25 DIAGNOSIS — D472 Monoclonal gammopathy: Secondary | ICD-10-CM | POA: Diagnosis not present

## 2020-12-25 DIAGNOSIS — D509 Iron deficiency anemia, unspecified: Secondary | ICD-10-CM

## 2020-12-25 DIAGNOSIS — Z79899 Other long term (current) drug therapy: Secondary | ICD-10-CM | POA: Diagnosis not present

## 2020-12-25 MED ORDER — LORATADINE 10 MG PO TABS
10.0000 mg | ORAL_TABLET | Freq: Once | ORAL | Status: AC
  Administered 2020-12-25: 10 mg via ORAL
  Filled 2020-12-25: qty 1

## 2020-12-25 MED ORDER — SODIUM CHLORIDE 0.9 % IV SOLN
400.0000 mg | Freq: Once | INTRAVENOUS | Status: AC
  Administered 2020-12-25: 400 mg via INTRAVENOUS
  Filled 2020-12-25: qty 20

## 2020-12-25 MED ORDER — ACETAMINOPHEN 325 MG PO TABS
650.0000 mg | ORAL_TABLET | Freq: Once | ORAL | Status: AC
  Administered 2020-12-25: 650 mg via ORAL
  Filled 2020-12-25: qty 2

## 2020-12-25 MED ORDER — SODIUM CHLORIDE 0.9 % IV SOLN
Freq: Once | INTRAVENOUS | Status: AC
Start: 2020-12-25 — End: 2020-12-25

## 2020-12-25 NOTE — Progress Notes (Signed)
Patient presents for Venofer infusion per providers order.  Vital signs WNL.  Patient has no new complaints at this time.  Peripheral IV started and blood return noted pre and post infusion.  Venofer infusion given today per MD orders.  Stable during infusion without adverse affects.  Vital signs stable.  No complaints at this time.  Discharge from clinic ambulatory in stable condition.  Alert and oriented X 3.  Follow up with [facility] as scheduled.

## 2020-12-25 NOTE — Patient Instructions (Signed)
[facility]  Discharge Instructions: Thank you for choosing [facility] to provide your oncology and hematology care.  If you have a lab appointment with the Cancer Center, please come in thru the Main Entrance and check in at the main information desk.  Wear comfortable clothing and clothing appropriate for easy access to any Portacath or PICC line.   We strive to give you quality time with your provider. You may need to reschedule your appointment if you arrive late (15 or more minutes).  Arriving late affects you and other patients whose appointments are after yours.  Also, if you miss three or more appointments without notifying the office, you may be dismissed from the clinic at the provider's discretion.      For prescription refill requests, have your pharmacy contact our office and allow 72 hours for refills to be completed.    Today you received the following chemotherapy and/or immunotherapy agents Venofer      To help prevent nausea and vomiting after your treatment, we encourage you to take your nausea medication as directed.  BELOW ARE SYMPTOMS THAT SHOULD BE REPORTED IMMEDIATELY: *FEVER GREATER THAN 100.4 F (38 C) OR HIGHER *CHILLS OR SWEATING *NAUSEA AND VOMITING THAT IS NOT CONTROLLED WITH YOUR NAUSEA MEDICATION *UNUSUAL SHORTNESS OF BREATH *UNUSUAL BRUISING OR BLEEDING *URINARY PROBLEMS (pain or burning when urinating, or frequent urination) *BOWEL PROBLEMS (unusual diarrhea, constipation, pain near the anus) TENDERNESS IN MOUTH AND THROAT WITH OR WITHOUT PRESENCE OF ULCERS (sore throat, sores in mouth, or a toothache) UNUSUAL RASH, SWELLING OR PAIN  UNUSUAL VAGINAL DISCHARGE OR ITCHING   Items with * indicate a potential emergency and should be followed up as soon as possible or go to the Emergency Department if any problems should occur.  Please show the CHEMOTHERAPY ALERT CARD or IMMUNOTHERAPY ALERT CARD at check-in to the Emergency  Department and triage nurse.  Should you have questions after your visit or need to cancel or reschedule your appointment, please contact [facility] [phone]  and follow the prompts.  Office hours are 8:00 a.m. to 4:30 p.m. Monday - Friday. Please note that voicemails left after 4:00 p.m. may not be returned until the following business day.  We are closed weekends and major holidays. You have access to a nurse at all times for urgent questions. Please call the main number to the clinic [phone] and follow the prompts.  For any non-urgent questions, you may also contact your provider using MyChart. We now offer e-Visits for anyone 18 and older to request care online for non-urgent symptoms. For details visit [email or web address].   Also download the MyChart app! Go to the app store, search "MyChart", open the app, select [facility], and log in with your MyChart username and password.  Due to Covid, a mask is required upon entering the hospital/clinic. If you do not have a mask, one will be given to you upon arrival. For doctor visits, patients may have 1 support person aged 18 or older with them. For treatment visits, patients cannot have anyone with them due to current Covid guidelines and our immunocompromised population.  

## 2020-12-27 DIAGNOSIS — D509 Iron deficiency anemia, unspecified: Secondary | ICD-10-CM | POA: Diagnosis not present

## 2020-12-27 DIAGNOSIS — I10 Essential (primary) hypertension: Secondary | ICD-10-CM | POA: Diagnosis not present

## 2021-01-26 ENCOUNTER — Inpatient Hospital Stay (HOSPITAL_COMMUNITY): Payer: Medicare Other

## 2021-01-26 DIAGNOSIS — C8330 Diffuse large B-cell lymphoma, unspecified site: Secondary | ICD-10-CM | POA: Diagnosis not present

## 2021-01-26 DIAGNOSIS — I10 Essential (primary) hypertension: Secondary | ICD-10-CM | POA: Diagnosis not present

## 2021-01-29 ENCOUNTER — Other Ambulatory Visit: Payer: Self-pay

## 2021-01-29 ENCOUNTER — Inpatient Hospital Stay (HOSPITAL_COMMUNITY): Payer: Medicare Other | Attending: Hematology

## 2021-01-29 DIAGNOSIS — R202 Paresthesia of skin: Secondary | ICD-10-CM | POA: Insufficient documentation

## 2021-01-29 DIAGNOSIS — R5383 Other fatigue: Secondary | ICD-10-CM | POA: Insufficient documentation

## 2021-01-29 DIAGNOSIS — N183 Chronic kidney disease, stage 3 unspecified: Secondary | ICD-10-CM | POA: Diagnosis not present

## 2021-01-29 DIAGNOSIS — D472 Monoclonal gammopathy: Secondary | ICD-10-CM | POA: Insufficient documentation

## 2021-01-29 DIAGNOSIS — Z87891 Personal history of nicotine dependence: Secondary | ICD-10-CM | POA: Diagnosis not present

## 2021-01-29 DIAGNOSIS — D473 Essential (hemorrhagic) thrombocythemia: Secondary | ICD-10-CM | POA: Diagnosis not present

## 2021-01-29 DIAGNOSIS — D471 Chronic myeloproliferative disease: Secondary | ICD-10-CM

## 2021-01-29 DIAGNOSIS — R0609 Other forms of dyspnea: Secondary | ICD-10-CM | POA: Diagnosis not present

## 2021-01-29 DIAGNOSIS — I129 Hypertensive chronic kidney disease with stage 1 through stage 4 chronic kidney disease, or unspecified chronic kidney disease: Secondary | ICD-10-CM | POA: Insufficient documentation

## 2021-01-29 DIAGNOSIS — D630 Anemia in neoplastic disease: Secondary | ICD-10-CM | POA: Diagnosis not present

## 2021-01-29 DIAGNOSIS — D509 Iron deficiency anemia, unspecified: Secondary | ICD-10-CM | POA: Diagnosis not present

## 2021-01-29 DIAGNOSIS — D539 Nutritional anemia, unspecified: Secondary | ICD-10-CM

## 2021-01-29 DIAGNOSIS — F419 Anxiety disorder, unspecified: Secondary | ICD-10-CM | POA: Diagnosis not present

## 2021-01-29 LAB — CBC WITH DIFFERENTIAL/PLATELET
Abs Immature Granulocytes: 0.02 10*3/uL (ref 0.00–0.07)
Basophils Absolute: 0 10*3/uL (ref 0.0–0.1)
Basophils Relative: 0 %
Eosinophils Absolute: 0 10*3/uL (ref 0.0–0.5)
Eosinophils Relative: 0 %
HCT: 32.2 % — ABNORMAL LOW (ref 36.0–46.0)
Hemoglobin: 10.8 g/dL — ABNORMAL LOW (ref 12.0–15.0)
Immature Granulocytes: 0 %
Lymphocytes Relative: 41 %
Lymphs Abs: 2.5 10*3/uL (ref 0.7–4.0)
MCH: 38 pg — ABNORMAL HIGH (ref 26.0–34.0)
MCHC: 33.5 g/dL (ref 30.0–36.0)
MCV: 113.4 fL — ABNORMAL HIGH (ref 80.0–100.0)
Monocytes Absolute: 0.7 10*3/uL (ref 0.1–1.0)
Monocytes Relative: 11 %
Neutro Abs: 2.9 10*3/uL (ref 1.7–7.7)
Neutrophils Relative %: 48 %
Platelets: 245 10*3/uL (ref 150–400)
RBC: 2.84 MIL/uL — ABNORMAL LOW (ref 3.87–5.11)
RDW: 18.2 % — ABNORMAL HIGH (ref 11.5–15.5)
WBC: 6 10*3/uL (ref 4.0–10.5)
nRBC: 0.5 % — ABNORMAL HIGH (ref 0.0–0.2)

## 2021-01-29 LAB — IRON AND TIBC
Iron: 74 ug/dL (ref 28–170)
Saturation Ratios: 30 % (ref 10.4–31.8)
TIBC: 248 ug/dL — ABNORMAL LOW (ref 250–450)
UIBC: 174 ug/dL

## 2021-01-29 LAB — COMPREHENSIVE METABOLIC PANEL
ALT: 11 U/L (ref 0–44)
AST: 14 U/L — ABNORMAL LOW (ref 15–41)
Albumin: 3.3 g/dL — ABNORMAL LOW (ref 3.5–5.0)
Alkaline Phosphatase: 50 U/L (ref 38–126)
Anion gap: 7 (ref 5–15)
BUN: 15 mg/dL (ref 8–23)
CO2: 22 mmol/L (ref 22–32)
Calcium: 8.8 mg/dL — ABNORMAL LOW (ref 8.9–10.3)
Chloride: 110 mmol/L (ref 98–111)
Creatinine, Ser: 0.92 mg/dL (ref 0.44–1.00)
GFR, Estimated: >60 mL/min (ref >60)
Glucose, Bld: 83 mg/dL (ref 70–99)
Potassium: 3.5 mmol/L (ref 3.5–5.1)
Sodium: 139 mmol/L (ref 135–145)
Total Bilirubin: 0.4 mg/dL (ref 0.3–1.2)
Total Protein: 7.4 g/dL (ref 6.5–8.1)

## 2021-01-29 LAB — LACTATE DEHYDROGENASE: LDH: 163 U/L (ref 98–192)

## 2021-01-29 LAB — VITAMIN B12: Vitamin B-12: 359 pg/mL (ref 180–914)

## 2021-01-29 LAB — FERRITIN: Ferritin: 228 ng/mL (ref 11–307)

## 2021-01-29 LAB — FOLATE: Folate: 12.6 ng/mL (ref >5.9)

## 2021-01-30 LAB — HOMOCYSTEINE: Homocysteine: 11 umol/L (ref 0.0–21.3)

## 2021-01-30 NOTE — Progress Notes (Signed)
[address], [place] [address]   CLINIC:  Medical Oncology/Hematology  PCP:  Rosita Fire, MD [address] [address] [phone]   REASON FOR VISIT:  Follow-up for anemia, JAK2 positive essential thrombocytosis, MGUS   CURRENT THERAPY: Hydrea, oral iron, intermittent IV iron infusions  INTERVAL HISTORY:  Ms. Venturella 87 y.o. female returns for routine follow-up of her JAK2 positive essential thrombocytosis, iron deficiency anemia, and MGUS.  She was last evaluated via telemedicine visit by [name] PA-C on 12/15/2020.  At today's visit, she reports feeling fairly well.  She feels slightly more fatigued today than at her last visit, along with some mild dyspnea on exertion.  Otherwise, she feels well and has not had any changes in her overall health status.  She is taking Hydrea 500 mg Monday/Wednesday/Friday, which she is tolerating well.  No skin ulcers or mouth sores.  No GI symptoms such as gastric upset, nausea, vomiting, or diarrhea.  Even though she is slightly more fatigued, she says that her energy overall has been good, and that she feels "blessed to be doing so good at her age."   She denies any chest pain on exertion, presyncopal episodes, palpitations.  Mild dyspnea on exertion as noted above.  She has not had any recent epistaxis, hematemesis, hematochezia, or melena.  She denies erythromelalgia, aquagenic pruritus, vasomotor symptoms.  No new bone pains.  No dizziness, new onset hearing loss, blurry vision, neurologic deficits.  She does have some chronic intermittent tingling in her hands and feet, but states that she is not diabetic; this has been no worse than usual. She denies any B symptoms such as fever, chills, night sweats, unintentional weight loss.  She has 50% energy and 75% appetite. She endorses that she is maintaining a stable weight.    REVIEW OF SYSTEMS:  Review of Systems   Constitutional:  Positive for fatigue. Negative for appetite change, chills, diaphoresis, fever and unexpected weight change.  HENT:   Negative for lump/mass and nosebleeds.   Eyes:  Negative for eye problems.  Respiratory:  Positive for shortness of breath (on exertion). Negative for cough and hemoptysis.   Cardiovascular:  Negative for chest pain, leg swelling and palpitations.  Gastrointestinal:  Negative for abdominal pain, blood in stool, constipation, diarrhea, nausea and vomiting.  Genitourinary:  Negative for hematuria.   Skin: Negative.   Neurological:  Negative for dizziness, headaches and light-headedness.  Hematological:  Does not bruise/bleed easily.     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   Vertigo, labyrinthine    Past Surgical History:  Procedure Laterality Date   CATARACT EXTRACTION W/PHACO Left 11/03/2015   Procedure: CATARACT EXTRACTION PHACO AND INTRAOCULAR LENS PLACEMENT ([place]);  Surgeon: Tonny Branch, MD;  Location: [facility];  Service: Ophthalmology;  Laterality: Left;  CDE: 7.74   CATARACT EXTRACTION W/PHACO  Right 11/20/2015   Procedure: CATARACT EXTRACTION PHACO AND INTRAOCULAR LENS PLACEMENT ; CDE:  8.30;  Surgeon: Tonny Branch, MD;  Location: [facility];  Service: Ophthalmology;  Laterality: Right;   PORT-A-CATH REMOVAL       SOCIAL HISTORY:  Social History   Socioeconomic History   Marital status: Widowed    Spouse name: Not on file    Number of children: Not on file   Years of education: Not on file   Highest education level: Not on file  Occupational History   Not on file  Tobacco Use   Smoking status: Former   Smokeless tobacco: Never  Vaping Use   Vaping Use: Never used  Substance and Sexual Activity   Alcohol use: No   Drug use: No   Sexual activity: Never  Other Topics Concern   Not on file  Social History Narrative   Not on file   Social Determinants of Health   Financial Resource Strain: Not on file  Food Insecurity: Not on file  Transportation Needs: Not on file  Physical Activity: Not on file  Stress: Not on file  Social Connections: Not on file  Intimate Partner Violence: Not on file    FAMILY HISTORY:  Family History  Problem Relation Age of Onset   Diabetes Mother     CURRENT MEDICATIONS:  Outpatient Encounter Medications as of 02/02/2021  Medication Sig Note   amLODipine (NORVASC) 2.5 MG tablet Take 2.5 mg by mouth daily. 01/10/2015: Received from: External Pharmacy Received Sig:    aspirin 81 MG tablet Take 81 mg by mouth daily.    atorvastatin (LIPITOR) 40 MG tablet Take 40 mg by mouth daily. 01/10/2015: Received from: External Pharmacy Received Sig:    busPIRone (BUSPAR) 5 MG tablet Take 1 tablet (5 mg total) by mouth 2 (two) times daily.    cilostazol (PLETAL) 100 MG tablet Take 100 mg by mouth 2 (two) times daily.    diclofenac (CATAFLAM) 50 MG tablet Take 1 tablet (50 mg total) by mouth 2 (two) times daily.    ferrous sulfate 325 (65 FE) MG EC tablet Take 1 tablet (325 mg total) by mouth daily with breakfast.    gabapentin (NEURONTIN) 300 MG capsule Take by mouth.    hydrochlorothiazide (HYDRODIURIL) 12.5 MG tablet Take 12.5 mg by mouth daily.    hydroxyurea (HYDREA) 500 MG capsule TAKE 1 CAPSULE BY MOUTH DAILY (MAY TAKE WITH FOOD TO MINIMIZE GI SIDE EFFECTS)    ibuprofen (ADVIL,MOTRIN) 800 MG tablet Take 800 mg by mouth every 8 (eight) hours as needed for moderate pain.  03/11/2015: Received from: External Pharmacy   loratadine (CLARITIN) 10 MG tablet Take 10 mg by mouth daily.    losartan (COZAAR) 100 MG tablet Take 100 mg by mouth daily.    potassium chloride SA (KLOR-CON) 20 MEQ tablet Take 1 tablet (20 mEq total) by mouth daily.    temazepam (RESTORIL) 15 MG capsule Take 1 capsule (15 mg total) by mouth at bedtime as needed for sleep.    tiZANidine (ZANAFLEX) 2 MG tablet Take 2 mg by mouth 2 (two) times daily as needed.    No facility-administered encounter medications on file as of 02/02/2021.    ALLERGIES:  Allergies  Allergen Reactions   Penicillins Rash     PHYSICAL EXAM:  ECOG PERFORMANCE STATUS: 1 - Symptomatic but completely ambulatory  There were no vitals filed for this visit. There were no vitals filed for this visit. Physical Exam Constitutional:  Appearance: Normal appearance. She is obese.  HENT:     Head: Normocephalic and atraumatic.     Mouth/Throat:     Mouth: Mucous membranes are moist.  Eyes:     Extraocular Movements: Extraocular movements intact.     Pupils: Pupils are equal, round, and reactive to light.  Cardiovascular:     Rate and Rhythm: Normal rate and regular rhythm.     Pulses: Normal pulses.     Heart sounds: Normal heart sounds.  Pulmonary:     Effort: Pulmonary effort is normal.     Breath sounds: Normal breath sounds.  Abdominal:     General: Bowel sounds are normal.     Palpations: Abdomen is soft.     Tenderness: There is no abdominal tenderness.  Musculoskeletal:        General: No swelling.     Right lower leg: No edema.     Left lower leg: No edema.  Lymphadenopathy:     Cervical: No cervical adenopathy.  Skin:    General: Skin is warm and dry.  Neurological:     General: No focal deficit present.     Mental Status: She is alert and oriented to person, place, and time.  Psychiatric:        Mood and Affect: Mood normal.        Behavior: Behavior normal.     LABORATORY DATA:  I  have reviewed the labs as listed.  CBC    Component Value Date/Time   WBC 6.0 01/29/2021 1436   RBC 2.84 (L) 01/29/2021 1436   HGB 10.8 (L) 01/29/2021 1436   HCT 32.2 (L) 01/29/2021 1436   PLT 245 01/29/2021 1436   MCV 113.4 (H) 01/29/2021 1436   MCH 38.0 (H) 01/29/2021 1436   MCHC 33.5 01/29/2021 1436   RDW 18.2 (H) 01/29/2021 1436   LYMPHSABS 2.5 01/29/2021 1436   MONOABS 0.7 01/29/2021 1436   EOSABS 0.0 01/29/2021 1436   BASOSABS 0.0 01/29/2021 1436   CMP Latest Ref Rng & Units 01/29/2021 12/08/2020 10/07/2020  Glucose 70 - 99 mg/dL 83 108(H) 118(H)  BUN 8 - 23 mg/dL 15 18 20   Creatinine 0.44 - 1.00 mg/dL 0.92 1.05(H) 1.03(H)  Sodium 135 - 145 mmol/L 139 138 135  Potassium 3.5 - 5.1 mmol/L 3.5 3.5 3.5  Chloride 98 - 111 mmol/L 110 108 105  CO2 22 - 32 mmol/L 22 26 23   Calcium 8.9 - 10.3 mg/dL 8.8(L) 8.7(L) 8.9  Total Protein 6.5 - 8.1 g/dL 7.4 7.4 7.6  Total Bilirubin 0.3 - 1.2 mg/dL 0.4 0.2(L) 0.8  Alkaline Phos 38 - 126 U/L 50 52 56  AST 15 - 41 U/L 14(L) 13(L) 14(L)  ALT 0 - 44 U/L 11 10 13     DIAGNOSTIC IMAGING:  I have independently reviewed the relevant imaging and discussed with the patient.  ASSESSMENT & PLAN: 1.  JAK2 positive essential thrombocytosis - Major manifestation is thrombocytosis.  She also has previous history of splenectomy. - Takes 81 mg aspirin daily - Currently taking Hydrea 500 mg M/W/F  - Tolerating Hydrea well, no fatigue, GI symptoms, or skin sores  - Most recent labs (01/29/2021): Platelets 245, Hgb 10.8/MCV 113.4, WBC 6.0.  LDH normal. - PLAN: We will continue same dose of Hydrea (500 mg M/W/F), but may consider decrease in the future for Hgb < 10.0.  - Decreased need for Hydrea may indicate progression towards post-ET myelofibrosis.  Would consider bone marrow biopsy, but patient has refused  this.  This is reasonable considering her advanced age and other comorbidities. - Repeat CBC and RTC in 2 months    2.  Iron deficiency anemia and  anemia of CKD: - Patient has longstanding iron deficiency anemia, has been seen at this clinic for over 10 years - Intermittent Venofer infusions, last given on 12/18/2020 and 12/25/2020 - Patient failed to improve on oral iron therapy - Patient had 1/3 stool occult blood cards positive, was referred to gastroenterology who did not feel any EGD/colonoscopy was necessary at this time. - Denies any gross rectal hemorrhage or melena.     - Patient has underlying CKD stage IIIa/IIIb with most recent GFR 51/creatinine 1.05, which is likely also contributing to anemia.  She does not see a nephrologist. - Multifactorial anemia secondary to CKD, intermittent iron deficiency; may also be some suppressive effect from Hydrea. - Symptomatic with mild fatigue and DOE - Nutritional panel (01/29/2021): Ferritin 228, iron saturation 30%; normal vitamin B12, folate, homocystine (methylmalonic acid pending) - Most recent CBC (01/29/2021): Hgb stable at 10.8 with MCV 113.4 - PLAN: No indication for IV iron at this time. - We will continue Hydrea as above. - Repeat labs and RTC in 2 months   3.  MGUS, IgG lambda and IgM kappa -Myeloma panel checked on 06/05/2020 to investigate other possible causes of anemia revealed underlying MGUS:  SPEP showed M spike 1.7, IFE showed monoclonal protein of both IgG lambda and IgM kappa; kappa free light chain only mildly elevated at 32.0 with normal lambda free light chains 23.4; normal free light chain ratio 1.37; LDH elevated at 198, beta-2 microglobulin elevated at 2.8 - 24-hour urine IFE showed IgG kappa monoclonal protein, M spike not observed - Skeletal survey (06/12/2020): No lytic lesions or evidence of skeletal multiple myeloma - No new bone pains, or B symptoms.   She does have some intermittent peripheral neuropathy.  - Does not meet CRAB criteria at this time, with most recent labs (01/29/2021): Hgb 10.8, creatinine 0.92, calcium 8.8 - Most recent MGUS panel (12/08/2020): M  spike trending upwards at 2.0; mildly elevated kappa (31.9) with normal lambda (23.0) and normal free light chain ratio (1.39). - Discussed MGUS and myeloma spectrum with patient and her son, with 1% per year risk of progression to multiple myeloma.  However, due to involved IgM protein and M spike > 1.5, bone marrow biopsy is recommended to evaluate for possible smoldering myeloma or multiple myeloma.   - PLAN: I have previously discussed at length with the patient and her son regarding recommended bone marrow biopsy due to M-spike > 1.5 and possible oligosecretory nature of plasma cell dyscrasia.  However, patient and her son expressed that they would prefer watchful waiting with labs and have declined bone marrow biopsy at this time.  (See note dated 10/07/2020). - We will repeat MGUS panel at follow-up visit in 2 months.     4.  History of marginal zone lymphoma of the spleen: - Diagnosed in 1999, status post CHOP therapy x7 cycles followed by rituximab for 1 cycle. - Posttreatment CT scan showed complete response. - Status post splenectomy - No B symptoms at this time.  No palpable lymphadenopathy on exam.     - PLAN: Continue monitoring with follow-up visits   5.  Anxiety: -Patient is taking BuSpar 5 mg twice daily - PLAN: Continue BuSpar as needed.   PLAN SUMMARY & DISPOSITION: Labs in 2 months RTC after labs  All questions were answered. The  patient knows to call the clinic with any problems, questions or concerns.  Medical decision making: Moderate  Time spent on visit: I spent 20 minutes counseling the patient face to face. The total time spent in the appointment was 30 minutes and more than 50% was on counseling.   Harriett Rush, PA-C  02/02/21 3:33 PM

## 2021-02-02 ENCOUNTER — Inpatient Hospital Stay (HOSPITAL_COMMUNITY): Payer: Medicare Other | Admitting: Physician Assistant

## 2021-02-02 ENCOUNTER — Other Ambulatory Visit: Payer: Self-pay

## 2021-02-02 VITALS — BP 118/68 | HR 95 | Temp 98.2°F | Resp 19 | Ht 63.0 in | Wt 153.7 lb

## 2021-02-02 DIAGNOSIS — D472 Monoclonal gammopathy: Secondary | ICD-10-CM

## 2021-02-02 DIAGNOSIS — I129 Hypertensive chronic kidney disease with stage 1 through stage 4 chronic kidney disease, or unspecified chronic kidney disease: Secondary | ICD-10-CM | POA: Diagnosis not present

## 2021-02-02 DIAGNOSIS — R0609 Other forms of dyspnea: Secondary | ICD-10-CM | POA: Diagnosis not present

## 2021-02-02 DIAGNOSIS — R202 Paresthesia of skin: Secondary | ICD-10-CM | POA: Diagnosis not present

## 2021-02-02 DIAGNOSIS — D473 Essential (hemorrhagic) thrombocythemia: Secondary | ICD-10-CM | POA: Diagnosis not present

## 2021-02-02 DIAGNOSIS — D630 Anemia in neoplastic disease: Secondary | ICD-10-CM | POA: Diagnosis not present

## 2021-02-02 DIAGNOSIS — D509 Iron deficiency anemia, unspecified: Secondary | ICD-10-CM

## 2021-02-02 DIAGNOSIS — N183 Chronic kidney disease, stage 3 unspecified: Secondary | ICD-10-CM | POA: Diagnosis not present

## 2021-02-02 DIAGNOSIS — Z87891 Personal history of nicotine dependence: Secondary | ICD-10-CM | POA: Diagnosis not present

## 2021-02-02 DIAGNOSIS — F419 Anxiety disorder, unspecified: Secondary | ICD-10-CM | POA: Diagnosis not present

## 2021-02-02 DIAGNOSIS — R5383 Other fatigue: Secondary | ICD-10-CM | POA: Diagnosis not present

## 2021-02-02 NOTE — Patient Instructions (Signed)
[place] at [facility] Discharge Instructions  You were seen today by [name] PA-C for your essential thrombocytosis, anemia, and MGUS.  ESSENTIAL THROMBOCYTOSIS: As we discussed, your "essential thrombocytosis" means that you have elevated platelets which is caused by a genetic mutation.  We are treating you with Hydrea, which helps to decrease your platelets.  Your platelets are within a normal range at this time, so we we will have you continue taking Hydrea 500 mg 3 days/week (Monday/Wednesday/Friday).  ANEMIA: You continue to have mildly low hemoglobin/red blood cells, which can be related to your underlying disease states as well as a side effect of the Hydrea.  You do not need any treatment for that at this time.  LABS: Return in 2 months for repeat labs  OTHER TESTS: None at this time  MEDICATIONS: Continue same dose of Hydrea 500 mg 3 days/week (Monday/Wednesday/Friday)  FOLLOW-UP APPOINTMENT: Office visit in 2 months (a week after labs)   Thank you for choosing [place] at [facility] to provide your oncology and hematology care.  To afford each patient quality time with our provider, please arrive at least 15 minutes before your scheduled appointment time.   If you have a lab appointment with the [facility] please come in thru the Main Entrance and check in at the main information desk.  You need to re-schedule your appointment should you arrive 10 or more minutes late.  We strive to give you quality time with our providers, and arriving late affects you and other patients whose appointments are after yours.  Also, if you no show three or more times for appointments you may be dismissed from the clinic at the providers discretion.     Again, thank you for choosing [facility].  Our hope is that these requests will decrease the amount of time that you wait before being seen by our physicians.        _____________________________________________________________  Should you have questions after your visit to [facility], please contact our office at [phone] and follow the prompts.  Our office hours are 8:00 a.m. and 4:30 p.m. Monday - Friday.  Please note that voicemails left after 4:00 p.m. may not be returned until the following business day.  We are closed weekends and major holidays.  You do have access to a nurse 24-7, just call the main number to the clinic [phone] and do not press any options, hold on the line and a nurse will answer the phone.    For prescription refill requests, have your pharmacy contact our office and allow 72 hours.    Due to Covid, you will need to wear a mask upon entering the hospital. If you do not have a mask, a mask will be given to you at the Main Entrance upon arrival. For doctor visits, patients may have 1 support person age 53 or older with them. For treatment visits, patients can not have anyone with them due to social distancing guidelines and our immunocompromised population.

## 2021-02-04 LAB — METHYLMALONIC ACID, SERUM: Methylmalonic Acid, Quantitative: 143 nmol/L (ref 0–378)

## 2021-02-26 DIAGNOSIS — I10 Essential (primary) hypertension: Secondary | ICD-10-CM | POA: Diagnosis not present

## 2021-02-26 DIAGNOSIS — C8330 Diffuse large B-cell lymphoma, unspecified site: Secondary | ICD-10-CM | POA: Diagnosis not present

## 2021-03-29 DIAGNOSIS — I10 Essential (primary) hypertension: Secondary | ICD-10-CM | POA: Diagnosis not present

## 2021-03-29 DIAGNOSIS — I739 Peripheral vascular disease, unspecified: Secondary | ICD-10-CM | POA: Diagnosis not present

## 2021-04-09 DIAGNOSIS — I10 Essential (primary) hypertension: Secondary | ICD-10-CM | POA: Diagnosis not present

## 2021-04-26 DIAGNOSIS — I739 Peripheral vascular disease, unspecified: Secondary | ICD-10-CM | POA: Diagnosis not present

## 2021-04-26 DIAGNOSIS — I10 Essential (primary) hypertension: Secondary | ICD-10-CM | POA: Diagnosis not present

## 2021-05-04 ENCOUNTER — Inpatient Hospital Stay (HOSPITAL_COMMUNITY): Payer: Medicare Other | Attending: Hematology

## 2021-05-11 ENCOUNTER — Ambulatory Visit (HOSPITAL_COMMUNITY): Payer: Medicare Other | Admitting: Physician Assistant

## 2021-05-25 DIAGNOSIS — D472 Monoclonal gammopathy: Secondary | ICD-10-CM | POA: Diagnosis not present

## 2021-05-25 DIAGNOSIS — Z0001 Encounter for general adult medical examination with abnormal findings: Secondary | ICD-10-CM | POA: Diagnosis not present

## 2021-05-25 DIAGNOSIS — D473 Essential (hemorrhagic) thrombocythemia: Secondary | ICD-10-CM | POA: Diagnosis not present

## 2021-05-25 DIAGNOSIS — I10 Essential (primary) hypertension: Secondary | ICD-10-CM | POA: Diagnosis not present

## 2021-05-27 DIAGNOSIS — D509 Iron deficiency anemia, unspecified: Secondary | ICD-10-CM | POA: Diagnosis not present

## 2021-05-27 DIAGNOSIS — D471 Chronic myeloproliferative disease: Secondary | ICD-10-CM | POA: Diagnosis not present

## 2021-05-27 DIAGNOSIS — I10 Essential (primary) hypertension: Secondary | ICD-10-CM | POA: Diagnosis not present

## 2021-05-27 DIAGNOSIS — E663 Overweight: Secondary | ICD-10-CM | POA: Diagnosis not present

## 2021-06-24 DIAGNOSIS — I10 Essential (primary) hypertension: Secondary | ICD-10-CM | POA: Diagnosis not present

## 2021-06-24 DIAGNOSIS — N1831 Chronic kidney disease, stage 3a: Secondary | ICD-10-CM | POA: Diagnosis not present

## 2021-07-22 ENCOUNTER — Inpatient Hospital Stay (HOSPITAL_COMMUNITY): Payer: Medicare Other | Attending: Hematology

## 2021-07-22 DIAGNOSIS — D631 Anemia in chronic kidney disease: Secondary | ICD-10-CM | POA: Diagnosis not present

## 2021-07-22 DIAGNOSIS — D473 Essential (hemorrhagic) thrombocythemia: Secondary | ICD-10-CM | POA: Diagnosis not present

## 2021-07-22 DIAGNOSIS — D472 Monoclonal gammopathy: Secondary | ICD-10-CM | POA: Diagnosis not present

## 2021-07-22 DIAGNOSIS — N189 Chronic kidney disease, unspecified: Secondary | ICD-10-CM | POA: Diagnosis not present

## 2021-07-22 DIAGNOSIS — D509 Iron deficiency anemia, unspecified: Secondary | ICD-10-CM | POA: Diagnosis not present

## 2021-07-22 LAB — COMPREHENSIVE METABOLIC PANEL
ALT: 11 U/L (ref 0–44)
AST: 13 U/L — ABNORMAL LOW (ref 15–41)
Albumin: 3.1 g/dL — ABNORMAL LOW (ref 3.5–5.0)
Alkaline Phosphatase: 53 U/L (ref 38–126)
Anion gap: 2 — ABNORMAL LOW (ref 5–15)
BUN: 19 mg/dL (ref 8–23)
CO2: 23 mmol/L (ref 22–32)
Calcium: 8.7 mg/dL — ABNORMAL LOW (ref 8.9–10.3)
Chloride: 111 mmol/L (ref 98–111)
Creatinine, Ser: 1.04 mg/dL — ABNORMAL HIGH (ref 0.44–1.00)
GFR, Estimated: 52 mL/min — ABNORMAL LOW (ref >60)
Glucose, Bld: 95 mg/dL (ref 70–99)
Potassium: 3.9 mmol/L (ref 3.5–5.1)
Sodium: 136 mmol/L (ref 135–145)
Total Bilirubin: 0.4 mg/dL (ref 0.3–1.2)
Total Protein: 7.6 g/dL (ref 6.5–8.1)

## 2021-07-22 LAB — CBC WITH DIFFERENTIAL/PLATELET
Abs Immature Granulocytes: 0.02 10*3/uL (ref 0.00–0.07)
Basophils Absolute: 0 10*3/uL (ref 0.0–0.1)
Basophils Relative: 0 %
Eosinophils Absolute: 0 10*3/uL (ref 0.0–0.5)
Eosinophils Relative: 0 %
HCT: 28.8 % — ABNORMAL LOW (ref 36.0–46.0)
Hemoglobin: 9.5 g/dL — ABNORMAL LOW (ref 12.0–15.0)
Immature Granulocytes: 0 %
Lymphocytes Relative: 47 %
Lymphs Abs: 2.8 10*3/uL (ref 0.7–4.0)
MCH: 35.6 pg — ABNORMAL HIGH (ref 26.0–34.0)
MCHC: 33 g/dL (ref 30.0–36.0)
MCV: 107.9 fL — ABNORMAL HIGH (ref 80.0–100.0)
Monocytes Absolute: 0.8 10*3/uL (ref 0.1–1.0)
Monocytes Relative: 12 %
Neutro Abs: 2.5 10*3/uL (ref 1.7–7.7)
Neutrophils Relative %: 41 %
Platelets: 253 10*3/uL (ref 150–400)
RBC: 2.67 MIL/uL — ABNORMAL LOW (ref 3.87–5.11)
RDW: 18.1 % — ABNORMAL HIGH (ref 11.5–15.5)
WBC: 6.2 10*3/uL (ref 4.0–10.5)
nRBC: 0.3 % — ABNORMAL HIGH (ref 0.0–0.2)

## 2021-07-22 LAB — FERRITIN: Ferritin: 85 ng/mL (ref 11–307)

## 2021-07-22 LAB — IRON AND TIBC
Iron: 73 ug/dL (ref 28–170)
Saturation Ratios: 27 % (ref 10.4–31.8)
TIBC: 268 ug/dL (ref 250–450)
UIBC: 195 ug/dL

## 2021-07-22 LAB — LACTATE DEHYDROGENASE: LDH: 122 U/L (ref 98–192)

## 2021-07-23 LAB — PROTEIN ELECTROPHORESIS, SERUM
A/G Ratio: 0.9 (ref 0.7–1.7)
Albumin ELP: 3.4 g/dL (ref 2.9–4.4)
Alpha-1-Globulin: 0.2 g/dL (ref 0.0–0.4)
Alpha-2-Globulin: 0.6 g/dL (ref 0.4–1.0)
Beta Globulin: 0.8 g/dL (ref 0.7–1.3)
Gamma Globulin: 2.5 g/dL — ABNORMAL HIGH (ref 0.4–1.8)
Globulin, Total: 4 g/dL — ABNORMAL HIGH (ref 2.2–3.9)
M-Spike, %: 2.3 g/dL — ABNORMAL HIGH
Total Protein ELP: 7.4 g/dL (ref 6.0–8.5)

## 2021-07-23 LAB — KAPPA/LAMBDA LIGHT CHAINS
Kappa free light chain: 27.4 mg/L — ABNORMAL HIGH (ref 3.3–19.4)
Kappa, lambda light chain ratio: 1.62 (ref 0.26–1.65)
Lambda free light chains: 16.9 mg/L (ref 5.7–26.3)

## 2021-07-25 DIAGNOSIS — C8330 Diffuse large B-cell lymphoma, unspecified site: Secondary | ICD-10-CM | POA: Diagnosis not present

## 2021-07-25 DIAGNOSIS — I10 Essential (primary) hypertension: Secondary | ICD-10-CM | POA: Diagnosis not present

## 2021-07-28 NOTE — Progress Notes (Unsigned)
[place], [place] [address]   CLINIC:  Medical Oncology/Hematology  PCP:  Carrolyn Meiers, MD [place] [address] [phone]   REASON FOR VISIT:  Follow-up for anemia, JAK2 positive essential thrombocytosis, MGUS   CURRENT THERAPY: Hydrea, oral iron, intermittent IV iron infusions  INTERVAL HISTORY:  Ms. Halbleib 88 y.o. female returns for routine follow-up of her JAK2 positive essential thrombocytosis, iron deficiency anemia, and MGUS.  She was last seen by [name] PA-C on 02/02/2021.  At today's visit, she reports feeling fair.  She has not had any recent hospitalizations or changes in her overall health status.  She is taking Hydrea 500 mg Monday/Wednesday/Friday, which she is tolerating well.   No skin ulcers or mouth sores.  No GI symptoms such as gastric upset, nausea, vomiting, or diarrhea.  She says that her energy overall has been good, and that she feels "blessed to be doing so good at her age."  She denies any chest pain on exertion, presyncopal episodes, palpitations.  Intermittent mild dyspnea on exertion.  She has not had any recent epistaxis, hematemesis, hematochezia, or melena.  She denies erythromelalgia, aquagenic pruritus, vasomotor symptoms.  No new bone pains.  No dizziness, new onset hearing loss, blurry vision, or new neurologic deficits.  She does have some chronic intermittent tingling in her hands and feet, but states that she is not diabetic; this has been no worse than usual.  She denies any B symptoms such as fever, chills, night sweats, unintentional weight loss.  She has not noticed any new lumps, bumps, or lymphadenopathy.  She has  85% energy and  100% appetite. She endorses that she is maintaining a stable weight.   REVIEW OF SYSTEMS:    Review of Systems  Constitutional:  Positive for fatigue (mild, at baseline). Negative for appetite change, chills, diaphoresis,  fever and unexpected weight change.  HENT:   Negative for lump/mass and nosebleeds.   Eyes:  Negative for eye problems.  Respiratory:  Negative for cough, hemoptysis and shortness of breath.   Cardiovascular:  Negative for chest pain, leg swelling and palpitations.  Gastrointestinal:  Positive for constipation. Negative for abdominal pain, blood in stool, diarrhea, nausea and vomiting.  Genitourinary:  Negative for hematuria.   Skin: Negative.   Neurological:  Negative for dizziness, headaches and light-headedness.  Hematological:  Does not bruise/bleed easily.     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   Vertigo, labyrinthine    Past Surgical History:  Procedure Laterality Date   CATARACT EXTRACTION W/PHACO Left 11/03/2015   Procedure: CATARACT EXTRACTION PHACO AND INTRAOCULAR LENS PLACEMENT ([facility]);  Surgeon: Tonny Branch, MD;  Location: [facility];  Service: Ophthalmology;  Laterality: Left;  CDE: 7.74   CATARACT EXTRACTION W/PHACO Right 11/20/2015   Procedure: CATARACT EXTRACTION PHACO AND INTRAOCULAR LENS PLACEMENT ; CDE:  8.30;  Surgeon: Tonny Branch, MD;  Location: [facility];  Service: Ophthalmology;  Laterality: Right;   PORT-A-CATH REMOVAL       SOCIAL HISTORY:  Social History   Socioeconomic History   Marital status: Widowed    Spouse name: Not on file   Number of children: Not on file   Years of education: Not on file   Highest education level: Not on file   Occupational History   Not on file  Tobacco Use   Smoking status: Former   Smokeless tobacco: Never  Vaping Use   Vaping Use: Never used  Substance and Sexual Activity   Alcohol use: No   Drug use: No   Sexual activity: Never  Other Topics Concern   Not on file  Social History Narrative   Not on file   Social Determinants of Health   Financial Resource Strain: Not on file  Food Insecurity: Not on file  Transportation Needs: Not on file  Physical Activity: Not on file  Stress: Not on file  Social Connections: Not on file  Intimate Partner Violence: Not on file    FAMILY HISTORY:  Family History  Problem Relation Age of Onset   Diabetes Mother     CURRENT MEDICATIONS:  Outpatient Encounter Medications as of 07/29/2021  Medication Sig Note   amLODipine (NORVASC) 2.5 MG tablet Take 2.5 mg by mouth daily. 01/10/2015: Received from: External Pharmacy Received Sig:    aspirin 81 MG tablet Take 81 mg by mouth daily.    atorvastatin (LIPITOR) 40 MG tablet Take 40 mg by mouth daily. 01/10/2015: Received from: External Pharmacy Received Sig:    busPIRone (BUSPAR) 5 MG tablet Take 1 tablet (5 mg total) by mouth 2 (two) times daily.    cilostazol (PLETAL) 100 MG tablet Take 100 mg by mouth 2 (two) times daily.    diclofenac (CATAFLAM) 50 MG tablet Take 1 tablet (50 mg total) by mouth 2 (two) times daily.    ferrous sulfate 325 (65 FE) MG EC tablet Take 1 tablet (325 mg total) by mouth daily with breakfast.    gabapentin (NEURONTIN) 300 MG capsule Take by mouth.    hydrochlorothiazide (HYDRODIURIL) 12.5 MG tablet Take 12.5 mg by mouth daily.    hydroxyurea (HYDREA) 500 MG capsule TAKE 1 CAPSULE BY MOUTH DAILY (MAY TAKE WITH FOOD TO MINIMIZE GI SIDE EFFECTS)    ibuprofen (ADVIL,MOTRIN) 800 MG tablet Take 800 mg by mouth every 8 (eight) hours as needed for moderate pain. 03/11/2015: Received from: External Pharmacy   loratadine (CLARITIN) 10 MG tablet Take 10 mg by mouth daily.     losartan (COZAAR) 100 MG tablet Take 100 mg by mouth daily.    potassium chloride SA (KLOR-CON) 20 MEQ tablet Take 1 tablet (20 mEq total) by mouth daily.    temazepam (RESTORIL) 15 MG capsule Take 1 capsule (15 mg total) by mouth at bedtime as needed for sleep. (Patient not taking: Reported on 02/02/2021)    tiZANidine (ZANAFLEX) 2 MG tablet Take 2 mg by mouth 2 (two) times daily as needed. (Patient not taking: Reported on 02/02/2021)    No facility-administered encounter medications on file as of 07/29/2021.    ALLERGIES:  Allergies  Allergen Reactions   Penicillins Rash     PHYSICAL EXAM:  ECOG PERFORMANCE STATUS: 1 - Symptomatic but completely ambulatory    There were no vitals filed for this visit. There were no vitals filed for this visit. Physical Exam Constitutional:  Appearance: Normal appearance. She is obese.  HENT:     Head: Normocephalic and atraumatic.     Mouth/Throat:     Mouth: Mucous membranes are moist.  Eyes:     Extraocular Movements: Extraocular movements intact.     Pupils: Pupils are equal, round, and reactive to light.  Cardiovascular:     Rate and Rhythm: Normal rate and regular rhythm.     Pulses: Normal pulses.     Heart sounds: Normal heart sounds.  Pulmonary:     Effort: Pulmonary effort is normal.     Breath sounds: Normal breath sounds.  Abdominal:     General: Bowel sounds are normal.     Palpations: Abdomen is soft.     Tenderness: There is no abdominal tenderness.  Musculoskeletal:        General: No swelling.     Right lower leg: No edema.     Left lower leg: No edema.  Lymphadenopathy:     Head:     Right side of head: No submental, submandibular, tonsillar, preauricular, posterior auricular or occipital adenopathy.     Left side of head: No submental, submandibular, tonsillar, preauricular, posterior auricular or occipital adenopathy.     Cervical: No cervical adenopathy.     Right cervical: No superficial, deep or posterior  cervical adenopathy.    Left cervical: No superficial, deep or posterior cervical adenopathy.     Upper Body:     Right upper body: No supraclavicular, axillary, pectoral or epitrochlear adenopathy.     Left upper body: No supraclavicular, axillary, pectoral or epitrochlear adenopathy.     Lower Body: No right inguinal adenopathy. No left inguinal adenopathy.  Skin:    General: Skin is warm and dry.  Neurological:     General: No focal deficit present.     Mental Status: She is alert and oriented to person, place, and time.  Psychiatric:        Mood and Affect: Mood normal.        Behavior: Behavior normal.     LABORATORY DATA:  I have reviewed the labs as listed.  CBC    Component Value Date/Time   WBC 6.2 07/22/2021 1149   RBC 2.67 (L) 07/22/2021 1149   HGB 9.5 (L) 07/22/2021 1149   HCT 28.8 (L) 07/22/2021 1149   PLT 253 07/22/2021 1149   MCV 107.9 (H) 07/22/2021 1149   MCH 35.6 (H) 07/22/2021 1149   MCHC 33.0 07/22/2021 1149   RDW 18.1 (H) 07/22/2021 1149   LYMPHSABS 2.8 07/22/2021 1149   MONOABS 0.8 07/22/2021 1149   EOSABS 0.0 07/22/2021 1149   BASOSABS 0.0 07/22/2021 1149      Latest Ref Rng & Units 07/22/2021   11:49 AM 01/29/2021    2:36 PM 12/08/2020   12:29 PM  CMP  Glucose 70 - 99 mg/dL 95   83   108    BUN 8 - 23 mg/dL 19   15   18     Creatinine 0.44 - 1.00 mg/dL 1.04   0.92   1.05    Sodium 135 - 145 mmol/L 136   139   138    Potassium 3.5 - 5.1 mmol/L 3.9   3.5   3.5    Chloride 98 - 111 mmol/L 111   110   108    CO2 22 - 32 mmol/L 23   22   26     Calcium 8.9 - 10.3 mg/dL 8.7   8.8  8.7    Total Protein 6.5 - 8.1 g/dL 7.6   7.4   7.4    Total Bilirubin 0.3 - 1.2 mg/dL 0.4   0.4   0.2    Alkaline Phos 38 - 126 U/L 53   50   52    AST 15 - 41 U/L 13   14   13     ALT 0 - 44 U/L 11   11   10       DIAGNOSTIC IMAGING:  I have independently reviewed the relevant imaging and discussed with the patient.  ASSESSMENT & PLAN: 1.  JAK2 positive essential  thrombocytosis - Major manifestation is thrombocytosis.  She also has previous history of splenectomy. - Takes 81 mg aspirin daily - Currently taking Hydrea 500 mg M/W/F    - Tolerating Hydrea well, no fatigue, GI symptoms, or skin sores   - No aquagenic pruritus, erythromelalgia, or vasomotor symptoms. - No lymphadenopathy or splenomegaly on exam - Most recent labs (07/22/2021): Platelets 253, Hgb 9.5/MCV 107.9, WBC 6.2.  LDH normal at 122. - PLAN: Due to normal platelets and low hemoglobin, we will DISCONTINUE HYDREA for the time being. - Decreased need for Hydrea may indicate progression towards post-ET myelofibrosis.  Would consider bone marrow biopsy, but patient has refused this.  This is reasonable considering her advanced age and other comorbidities.  However, we will continue to offer this as long as it is indicated. - Repeat CBC and RTC in 2 months      2.  Iron deficiency anemia and anemia of CKD: - Patient has longstanding iron deficiency anemia, has been seen at this clinic for over 10 years - Intermittent Venofer infusions, last given on 12/18/2020 and 12/25/2020 - Patient failed to improve on oral iron therapy - Patient had 1/3 stool occult blood cards positive, was referred to gastroenterology who did not feel any EGD/colonoscopy was necessary at this time. - Denies any gross rectal hemorrhage or melena.       - Patient has underlying CKD stage IIIa/IIIb with most recent GFR 52/creatinine 1.04, which is likely also contributing to anemia.  She does not see a nephrologist. - Multifactorial anemia secondary to CKD, intermittent iron deficiency; may also be some suppressive effect from Hydrea. - Symptomatic with mild fatigue and mild dyspnea on exertion - Iron panel (07/22/2021): Ferritin 85, iron saturation 27% - CBC (07/22/2021): Hgb 9.5/MCV 107.9 - Nutritional panel (01/29/2021): Normal vitamin B12, folate, homocystine, and methylmalonic acid  - PLAN: No indication for IV iron at  this time. - We will discontinue Hydrea and recheck labs in 2 months. - Would consider initiation of Retacrit if she continues to have Hgb <10.0 and absence of any other cause.   3.  MGUS, IgG lambda and IgM kappa - Myeloma panel checked on 06/05/2020 to investigate other possible causes of anemia revealed underlying MGUS:  SPEP showed M spike 1.7, IFE showed monoclonal protein of both IgG lambda and IgM kappa; kappa free light chain only mildly elevated at 32.0 with normal lambda free light chains 23.4; normal free light chain ratio 1.37; LDH elevated at 198, beta-2 microglobulin elevated at 2.8 - 24-hour urine IFE showed IgG kappa monoclonal protein, M spike not observed - Most recent skeletal survey (06/12/2020): No lytic lesions or evidence of skeletal multiple myeloma - No new bone pains, or B symptoms.   She does have some intermittent peripheral neuropathy.    - Labs (07/22/2021) with worsening anemia with Hgb 9.5, as above.  Creatinine stable at 1.04 with calcium 8.7. - Most recent MGUS/myeloma panel (07/22/2021): M spike trending upwards at 2.3; mildly elevated kappa (27.4) with normal lambda (16.9) and normal free light chain ratio (1.62). - Discussed MGUS and myeloma spectrum with patient and her son at office visit on 10/07/2020.  Discussed 1% per year risk of progression to multiple myeloma.  However, due to involved IgM protein and M spike > 1.5, bone marrow biopsy is recommended to evaluate for possible smoldering myeloma or multiple myeloma.   - PLAN: I have previously discussed at length with the patient and her son regarding recommended bone marrow biopsy due to M-spike > 1.5 and possible oligosecretory nature of plasma cell dyscrasia.  However, patient and her son expressed that they would prefer watchful waiting with labs and have declined bone marrow biopsy at this time.  (See note dated 10/07/2020).  We will continue to offer in the future as long as this is indicated. - Some concern for  progression with increased M spike and worsening anemia as noted above. - If she remains anemic despite discontinuation of Hydrea, we will check PET scan to evaluate for any signs of myeloma. - We will repeat SPEP at follow-up visit in 2 months months. - She is due for skeletal survey.  We will obtain this today and follow results.   4.  History of marginal zone lymphoma of the spleen: - Diagnosed in 1999, status post CHOP therapy x7 cycles followed by rituximab for 1 cycle. - Posttreatment CT scan showed complete response. - Status post splenectomy - No B symptoms at this time.  No palpable lymphadenopathy on exam.       - PLAN: Continue monitoring with follow-up visits   5.  Anxiety: -Patient is taking BuSpar 5 mg twice daily - PLAN: Continue BuSpar as needed.   PLAN SUMMARY & DISPOSITION:   Skeletal survey today Labs in 2 months RTC after labs  All questions were answered. The patient knows to call the clinic with any problems, questions or concerns.  Medical decision making: Moderate    Time spent on visit: I spent 20 minutes counseling the patient face to face. The total time spent in the appointment was 30 minutes and more than 50% was on counseling.   Harriett Rush, PA-C  07/29/2021 2:24 PM

## 2021-07-29 ENCOUNTER — Ambulatory Visit (HOSPITAL_COMMUNITY)
Admission: RE | Admit: 2021-07-29 | Discharge: 2021-07-29 | Disposition: A | Discharge status: Home / Self Care | Payer: Medicare Other | Source: Ambulatory Visit | Attending: Physician Assistant | Admitting: Physician Assistant

## 2021-07-29 ENCOUNTER — Inpatient Hospital Stay (HOSPITAL_COMMUNITY): Payer: Medicare Other | Attending: Hematology | Admitting: Physician Assistant

## 2021-07-29 VITALS — BP 123/56 | HR 99 | Temp 97.5°F | Resp 18 | Ht 63.0 in | Wt 157.8 lb

## 2021-07-29 DIAGNOSIS — D472 Monoclonal gammopathy: Secondary | ICD-10-CM | POA: Insufficient documentation

## 2021-07-29 DIAGNOSIS — E7800 Pure hypercholesterolemia, unspecified: Secondary | ICD-10-CM | POA: Insufficient documentation

## 2021-07-29 DIAGNOSIS — Z8572 Personal history of non-Hodgkin lymphomas: Secondary | ICD-10-CM | POA: Diagnosis not present

## 2021-07-29 DIAGNOSIS — D539 Nutritional anemia, unspecified: Secondary | ICD-10-CM

## 2021-07-29 DIAGNOSIS — Z79899 Other long term (current) drug therapy: Secondary | ICD-10-CM | POA: Insufficient documentation

## 2021-07-29 DIAGNOSIS — R0609 Other forms of dyspnea: Secondary | ICD-10-CM | POA: Diagnosis not present

## 2021-07-29 DIAGNOSIS — R202 Paresthesia of skin: Secondary | ICD-10-CM | POA: Insufficient documentation

## 2021-07-29 DIAGNOSIS — Z7982 Long term (current) use of aspirin: Secondary | ICD-10-CM | POA: Diagnosis not present

## 2021-07-29 DIAGNOSIS — D473 Essential (hemorrhagic) thrombocythemia: Secondary | ICD-10-CM | POA: Insufficient documentation

## 2021-07-29 DIAGNOSIS — D509 Iron deficiency anemia, unspecified: Secondary | ICD-10-CM | POA: Insufficient documentation

## 2021-07-29 DIAGNOSIS — I10 Essential (primary) hypertension: Secondary | ICD-10-CM | POA: Diagnosis not present

## 2021-07-29 NOTE — Patient Instructions (Signed)
[place] at [facility] Discharge Instructions  You were seen today by [name] PA-C for your essential thrombocytosis, anemia, and MGUS.  ESSENTIAL THROMBOCYTOSIS: As we discussed, your "essential thrombocytosis" means that you have elevated platelets which is caused by a genetic mutation.  We have been treating you with Hydrea, but this may be causing you to be anemic.  We will STOP your Hydrea and recheck your labs in 2 months.  ANEMIA: You continue to have mildly low hemoglobin/red blood cells, which can be related to your underlying disease states as well as a side effect of the Hydrea.  We will stop your Hydrea and see if you have improved blood counts at your follow-up visit in 2 months.  MGUS: Your MGUS protein (abnormal protein) is getting higher, which raises concern that you may have a type of cancer called multiple myeloma.  Since you have informed us that you do not want bone marrow biopsy, we will continue to recheck these labs.  If they continue to increase at your follow-up visit, we will check a PET scan for any signs of cancer.  We will check SKELETAL SURVEY (X-RAY) today to see if you have any spots on your bone.  LABS: Return in 2 months for repeat labs  OTHER TESTS: None at this time  MEDICATIONS: Stop your Hydrea  FOLLOW-UP APPOINTMENT: Office visit in 2 months (a week after labs)   Thank you for choosing [place] at [facility] to provide your oncology and hematology care.  To afford each patient quality time with our provider, please arrive at least 15 minutes before your scheduled appointment time.   If you have a lab appointment with the [facility] please come in thru the Main Entrance and check in at the main information desk.  You need to re-schedule your appointment should you arrive 10 or more minutes late.  We strive to give you quality time with our providers, and arriving late affects you and other  patients whose appointments are after yours.  Also, if you no show three or more times for appointments you may be dismissed from the clinic at the providers discretion.     Again, thank you for choosing [facility].  Our hope is that these requests will decrease the amount of time that you wait before being seen by our physicians.       _____________________________________________________________  Should you have questions after your visit to [facility], please contact our office at [phone] and follow the prompts.  Our office hours are 8:00 a.m. and 4:30 p.m. Monday - Friday.  Please note that voicemails left after 4:00 p.m. may not be returned until the following business day.  We are closed weekends and major holidays.  You do have access to a nurse 24-7, just call the main number to the clinic [phone] and do not press any options, hold on the line and a nurse will answer the phone.    For prescription refill requests, have your pharmacy contact our office and allow 72 hours.    Due to Covid, you will need to wear a mask upon entering the hospital. If you do not have a mask, a mask will be given to you at the Main Entrance upon arrival. For doctor visits, patients may have 1 support person age 1 or older with them. For treatment visits, patients can not have anyone with them due to social distancing  guidelines and our immunocompromised population.

## 2021-08-05 ENCOUNTER — Other Ambulatory Visit: Payer: Self-pay

## 2021-08-05 ENCOUNTER — Inpatient Hospital Stay (HOSPITAL_COMMUNITY): Payer: Medicare Other

## 2021-08-05 ENCOUNTER — Ambulatory Visit
Admission: EM | Admit: 2021-08-05 | Discharge: 2021-08-05 | Disposition: A | Discharge status: Nursing Home (Includes ICF) | Payer: Medicare Other | Attending: Nurse Practitioner | Admitting: Nurse Practitioner

## 2021-08-05 ENCOUNTER — Inpatient Hospital Stay (HOSPITAL_COMMUNITY)
Admission: EM | Admit: 2021-08-05 | Discharge: 2021-08-07 | DRG: 177 | Disposition: A | Discharge status: Home / Self Care | Payer: Medicare Other | Attending: Internal Medicine | Admitting: Internal Medicine

## 2021-08-05 ENCOUNTER — Emergency Department (HOSPITAL_COMMUNITY): Payer: Medicare Other

## 2021-08-05 DIAGNOSIS — Z9081 Acquired absence of spleen: Secondary | ICD-10-CM

## 2021-08-05 DIAGNOSIS — Z7982 Long term (current) use of aspirin: Secondary | ICD-10-CM | POA: Diagnosis not present

## 2021-08-05 DIAGNOSIS — Z9841 Cataract extraction status, right eye: Secondary | ICD-10-CM

## 2021-08-05 DIAGNOSIS — J9811 Atelectasis: Secondary | ICD-10-CM | POA: Diagnosis not present

## 2021-08-05 DIAGNOSIS — F32A Depression, unspecified: Secondary | ICD-10-CM | POA: Diagnosis not present

## 2021-08-05 DIAGNOSIS — Z880 Allergy status to penicillin: Secondary | ICD-10-CM | POA: Diagnosis not present

## 2021-08-05 DIAGNOSIS — I129 Hypertensive chronic kidney disease with stage 1 through stage 4 chronic kidney disease, or unspecified chronic kidney disease: Secondary | ICD-10-CM | POA: Diagnosis present

## 2021-08-05 DIAGNOSIS — Z79899 Other long term (current) drug therapy: Secondary | ICD-10-CM | POA: Diagnosis not present

## 2021-08-05 DIAGNOSIS — F419 Anxiety disorder, unspecified: Secondary | ICD-10-CM | POA: Diagnosis not present

## 2021-08-05 DIAGNOSIS — R519 Headache, unspecified: Secondary | ICD-10-CM | POA: Diagnosis not present

## 2021-08-05 DIAGNOSIS — R6510 Systemic inflammatory response syndrome (SIRS) of non-infectious origin without acute organ dysfunction: Secondary | ICD-10-CM | POA: Diagnosis not present

## 2021-08-05 DIAGNOSIS — Z87891 Personal history of nicotine dependence: Secondary | ICD-10-CM | POA: Diagnosis not present

## 2021-08-05 DIAGNOSIS — N179 Acute kidney failure, unspecified: Secondary | ICD-10-CM | POA: Diagnosis not present

## 2021-08-05 DIAGNOSIS — I10 Essential (primary) hypertension: Secondary | ICD-10-CM | POA: Diagnosis not present

## 2021-08-05 DIAGNOSIS — J9601 Acute respiratory failure with hypoxia: Secondary | ICD-10-CM | POA: Diagnosis present

## 2021-08-05 DIAGNOSIS — D472 Monoclonal gammopathy: Secondary | ICD-10-CM | POA: Diagnosis not present

## 2021-08-05 DIAGNOSIS — I739 Peripheral vascular disease, unspecified: Secondary | ICD-10-CM | POA: Diagnosis not present

## 2021-08-05 DIAGNOSIS — Z9049 Acquired absence of other specified parts of digestive tract: Secondary | ICD-10-CM | POA: Diagnosis not present

## 2021-08-05 DIAGNOSIS — D473 Essential (hemorrhagic) thrombocythemia: Secondary | ICD-10-CM

## 2021-08-05 DIAGNOSIS — Z8572 Personal history of non-Hodgkin lymphomas: Secondary | ICD-10-CM

## 2021-08-05 DIAGNOSIS — R059 Cough, unspecified: Secondary | ICD-10-CM | POA: Diagnosis not present

## 2021-08-05 DIAGNOSIS — E7800 Pure hypercholesterolemia, unspecified: Secondary | ICD-10-CM | POA: Diagnosis present

## 2021-08-05 DIAGNOSIS — Z961 Presence of intraocular lens: Secondary | ICD-10-CM | POA: Diagnosis present

## 2021-08-05 DIAGNOSIS — U071 COVID-19: Principal | ICD-10-CM | POA: Diagnosis present

## 2021-08-05 DIAGNOSIS — R0902 Hypoxemia: Secondary | ICD-10-CM | POA: Diagnosis not present

## 2021-08-05 DIAGNOSIS — R509 Fever, unspecified: Secondary | ICD-10-CM

## 2021-08-05 DIAGNOSIS — R000 Tachycardia, unspecified: Secondary | ICD-10-CM

## 2021-08-05 DIAGNOSIS — Z9842 Cataract extraction status, left eye: Secondary | ICD-10-CM

## 2021-08-05 DIAGNOSIS — N1831 Chronic kidney disease, stage 3a: Secondary | ICD-10-CM | POA: Diagnosis present

## 2021-08-05 DIAGNOSIS — G4489 Other headache syndrome: Secondary | ICD-10-CM | POA: Diagnosis not present

## 2021-08-05 LAB — RESP PANEL BY RT-PCR (FLU A&B, COVID) ARPGX2
Influenza A by PCR: NEGATIVE
Influenza B by PCR: NEGATIVE
SARS Coronavirus 2 by RT PCR: POSITIVE — AB

## 2021-08-05 LAB — URINALYSIS, ROUTINE W REFLEX MICROSCOPIC
Bacteria, UA: NONE SEEN
Bilirubin Urine: NEGATIVE
Glucose, UA: NEGATIVE mg/dL
Ketones, ur: NEGATIVE mg/dL
Leukocytes,Ua: NEGATIVE
Nitrite: NEGATIVE
Protein, ur: NEGATIVE mg/dL
Specific Gravity, Urine: 1.016 (ref 1.005–1.030)
pH: 5 (ref 5.0–8.0)

## 2021-08-05 LAB — COMPREHENSIVE METABOLIC PANEL
ALT: 11 U/L (ref 0–44)
AST: 16 U/L (ref 15–41)
Albumin: 3.3 g/dL — ABNORMAL LOW (ref 3.5–5.0)
Alkaline Phosphatase: 55 U/L (ref 38–126)
Anion gap: 8 (ref 5–15)
BUN: 26 mg/dL — ABNORMAL HIGH (ref 8–23)
CO2: 24 mmol/L (ref 22–32)
Calcium: 8.8 mg/dL — ABNORMAL LOW (ref 8.9–10.3)
Chloride: 101 mmol/L (ref 98–111)
Creatinine, Ser: 1.31 mg/dL — ABNORMAL HIGH (ref 0.44–1.00)
GFR, Estimated: 39 mL/min — ABNORMAL LOW (ref >60)
Glucose, Bld: 86 mg/dL (ref 70–99)
Potassium: 3.7 mmol/L (ref 3.5–5.1)
Sodium: 133 mmol/L — ABNORMAL LOW (ref 135–145)
Total Bilirubin: 0.5 mg/dL (ref 0.3–1.2)
Total Protein: 8.3 g/dL — ABNORMAL HIGH (ref 6.5–8.1)

## 2021-08-05 LAB — APTT: aPTT: 26 seconds (ref 24–36)

## 2021-08-05 LAB — CBC WITH DIFFERENTIAL/PLATELET
Abs Immature Granulocytes: 0.04 10*3/uL (ref 0.00–0.07)
Basophils Absolute: 0 10*3/uL (ref 0.0–0.1)
Basophils Relative: 0 %
Eosinophils Absolute: 0 10*3/uL (ref 0.0–0.5)
Eosinophils Relative: 0 %
HCT: 30.8 % — ABNORMAL LOW (ref 36.0–46.0)
Hemoglobin: 10 g/dL — ABNORMAL LOW (ref 12.0–15.0)
Immature Granulocytes: 1 %
Lymphocytes Relative: 19 %
Lymphs Abs: 1.5 10*3/uL (ref 0.7–4.0)
MCH: 35.1 pg — ABNORMAL HIGH (ref 26.0–34.0)
MCHC: 32.5 g/dL (ref 30.0–36.0)
MCV: 108.1 fL — ABNORMAL HIGH (ref 80.0–100.0)
Monocytes Absolute: 1.8 10*3/uL — ABNORMAL HIGH (ref 0.1–1.0)
Monocytes Relative: 22 %
Neutro Abs: 4.6 10*3/uL (ref 1.7–7.7)
Neutrophils Relative %: 58 %
Platelets: 284 10*3/uL (ref 150–400)
RBC: 2.85 MIL/uL — ABNORMAL LOW (ref 3.87–5.11)
RDW: 18.3 % — ABNORMAL HIGH (ref 11.5–15.5)
WBC: 7.9 10*3/uL (ref 4.0–10.5)
nRBC: 0.9 % — ABNORMAL HIGH (ref 0.0–0.2)

## 2021-08-05 LAB — POCT URINALYSIS DIP (MANUAL ENTRY)
Bilirubin, UA: NEGATIVE
Glucose, UA: NEGATIVE mg/dL
Ketones, POC UA: NEGATIVE mg/dL
Leukocytes, UA: NEGATIVE
Nitrite, UA: NEGATIVE
Protein Ur, POC: NEGATIVE mg/dL
Spec Grav, UA: 1.025 (ref 1.010–1.025)
Urobilinogen, UA: 0.2 E.U./dL
pH, UA: 5 (ref 5.0–8.0)

## 2021-08-05 LAB — FERRITIN: Ferritin: 91 ng/mL (ref 11–307)

## 2021-08-05 LAB — PROTIME-INR
INR: 1.1 (ref 0.8–1.2)
Prothrombin Time: 14.3 seconds (ref 11.4–15.2)

## 2021-08-05 LAB — LACTIC ACID, PLASMA
Lactic Acid, Venous: 1.2 mmol/L (ref 0.5–1.9)
Lactic Acid, Venous: 1.7 mmol/L (ref 0.5–1.9)

## 2021-08-05 LAB — GLUCOSE, CAPILLARY: Glucose-Capillary: 89 mg/dL (ref 70–99)

## 2021-08-05 LAB — LACTATE DEHYDROGENASE: LDH: 148 U/L (ref 98–192)

## 2021-08-05 LAB — D-DIMER, QUANTITATIVE: D-Dimer, Quant: 1.56 ug/mL-FEU — ABNORMAL HIGH (ref 0.00–0.50)

## 2021-08-05 LAB — PROCALCITONIN: Procalcitonin: 0.15 ng/mL

## 2021-08-05 MED ORDER — ACETAMINOPHEN 325 MG PO TABS
650.0000 mg | ORAL_TABLET | Freq: Once | ORAL | Status: AC
  Administered 2021-08-05: 650 mg via ORAL
  Filled 2021-08-05: qty 2

## 2021-08-05 MED ORDER — IOHEXOL 350 MG/ML SOLN
60.0000 mL | Freq: Once | INTRAVENOUS | Status: AC | PRN
  Administered 2021-08-05: 60 mL via INTRAVENOUS

## 2021-08-05 MED ORDER — ALBUTEROL SULFATE HFA 108 (90 BASE) MCG/ACT IN AERS
2.0000 | INHALATION_SPRAY | Freq: Four times a day (QID) | RESPIRATORY_TRACT | Status: DC
  Administered 2021-08-05: 2 via RESPIRATORY_TRACT
  Filled 2021-08-05: qty 6.7

## 2021-08-05 MED ORDER — SODIUM CHLORIDE 0.9 % IV SOLN
1.0000 g | Freq: Once | INTRAVENOUS | Status: AC
  Administered 2021-08-05: 1 g via INTRAVENOUS
  Filled 2021-08-05: qty 10

## 2021-08-05 MED ORDER — MOLNUPIRAVIR EUA 200MG CAPSULE
4.0000 | ORAL_CAPSULE | Freq: Two times a day (BID) | ORAL | Status: DC
  Administered 2021-08-05 – 2021-08-07 (×4): 800 mg via ORAL
  Filled 2021-08-05: qty 4

## 2021-08-05 MED ORDER — GUAIFENESIN-DM 100-10 MG/5ML PO SYRP
10.0000 mL | ORAL_SOLUTION | Freq: Three times a day (TID) | ORAL | Status: AC
  Administered 2021-08-05 – 2021-08-07 (×5): 10 mL via ORAL
  Filled 2021-08-05 (×5): qty 10

## 2021-08-05 MED ORDER — NIRMATRELVIR/RITONAVIR (PAXLOVID) TABLET (RENAL DOSING): 2.0000 | ORAL_TABLET | Freq: Two times a day (BID) | ORAL | Status: DC

## 2021-08-05 MED ORDER — ONDANSETRON HCL 4 MG PO TABS: 4.0000 mg | ORAL_TABLET | Freq: Four times a day (QID) | ORAL | Status: DC | PRN

## 2021-08-05 MED ORDER — MELATONIN 3 MG PO TABS
6.0000 mg | ORAL_TABLET | Freq: Once | ORAL | Status: AC
  Administered 2021-08-06: 6 mg via ORAL
  Filled 2021-08-05: qty 2

## 2021-08-05 MED ORDER — ACETAMINOPHEN 325 MG PO TABS
650.0000 mg | ORAL_TABLET | Freq: Four times a day (QID) | ORAL | Status: DC | PRN
  Administered 2021-08-06: 650 mg via ORAL
  Filled 2021-08-05: qty 2

## 2021-08-05 MED ORDER — SODIUM CHLORIDE 0.9 % IV SOLN: INTRAVENOUS | Status: AC

## 2021-08-05 MED ORDER — SODIUM CHLORIDE 0.9 % IV SOLN
500.0000 mg | Freq: Once | INTRAVENOUS | Status: AC
  Administered 2021-08-05: 500 mg via INTRAVENOUS
  Filled 2021-08-05: qty 5

## 2021-08-05 MED ORDER — ENSURE ENLIVE PO LIQD
237.0000 mL | Freq: Two times a day (BID) | ORAL | Status: DC
  Administered 2021-08-06: 237 mL via ORAL

## 2021-08-05 MED ORDER — HYDROCOD POLI-CHLORPHE POLI ER 10-8 MG/5ML PO SUER: 5.0000 mL | Freq: Two times a day (BID) | ORAL | Status: DC | PRN

## 2021-08-05 MED ORDER — POLYETHYLENE GLYCOL 3350 17 G PO PACK: 17.0000 g | PACK | Freq: Every day | ORAL | Status: DC | PRN

## 2021-08-05 MED ORDER — ALBUTEROL SULFATE HFA 108 (90 BASE) MCG/ACT IN AERS: 2.0000 | INHALATION_SPRAY | Freq: Four times a day (QID) | RESPIRATORY_TRACT | Status: DC | PRN

## 2021-08-05 MED ORDER — LACTATED RINGERS IV BOLUS
1000.0000 mL | Freq: Once | INTRAVENOUS | Status: AC
  Administered 2021-08-05: 1000 mL via INTRAVENOUS

## 2021-08-05 MED ORDER — BUSPIRONE HCL 5 MG PO TABS: 5.0000 mg | ORAL_TABLET | Freq: Two times a day (BID) | ORAL | Status: DC

## 2021-08-05 MED ORDER — HEPARIN SODIUM (PORCINE) 5000 UNIT/ML IJ SOLN
5000.0000 [IU] | Freq: Three times a day (TID) | INTRAMUSCULAR | Status: DC
Start: 2021-08-05 — End: 2021-08-07
  Administered 2021-08-05 – 2021-08-07 (×5): 5000 [IU] via SUBCUTANEOUS
  Filled 2021-08-05 (×5): qty 1

## 2021-08-05 MED ORDER — ONDANSETRON HCL 4 MG/2ML IJ SOLN: 4.0000 mg | Freq: Four times a day (QID) | INTRAMUSCULAR | Status: DC | PRN

## 2021-08-05 NOTE — ED Provider Notes (Signed)
[facility]    CSN: [id number] Arrival date & time: 08/05/21  1241      History   Chief Complaint Chief Complaint  Patient presents with   Dysuria   Chills        Headache    HPI Leslie Williamson is a 88 y.o. female.   Patient presents with daughter for new incontinence, headache, chills.  She reports the symptoms started abruptly last night.  She has not been able to hold her urine at all today.  She denies cough or congestion, fevers, decreased appetite.  Medical history significant for myeloproliferative disorder, non-Hodgkin's lymphoma, status post splenectomy, depression, high cholesterol, hypertension.    Past Medical History:  Diagnosis Date   Anxiety    Complication of anesthesia    difficult to wake up   Depression    Hypercholesteremia    Hypertension    Hypokalemia    Iron deficiency anemia    Left hip pain    NHL (non-Hodgkin's lymphoma) ([place]) 12/17/2010   Obesity    Primary thrombocytosis ([place]) 01/11/2006   Secondary to splenectomy     PVD (peripheral vascular disease) (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[place])    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   Vertigo, labyrinthine     Patient Active Problem List   Diagnosis Date Noted   Occult blood positive stool 10/23/2020   Labyrinthine vertigo 10/26/2015   Elevated troponin 10/25/2015   Chest pain 10/25/2015   Vertigo    S/P splenectomy 01/01/2014   Claudication of both lower extremities ([place]) 11/06/2013   NHL (non-Hodgkin's lymphoma) ([place]) 12/17/2010   WEIGHT LOSS, ABNORMAL 02/05/2009   COLONIC POLYPS, ADENOMATOUS, HX OF 02/05/2009   GERD 08/08/2008   Iron deficiency anemia 06/12/2008   ANXIETY 03/20/2007   HYPERLIPIDEMIA 01/11/2006   JAK2+ MDP (myeloproliferative disorder) presenting with thrombocytosis ([place]) 01/11/2006   CARPAL TUNNEL SYNDROME 01/11/2006   Essential hypertension 01/11/2006   PVD- know Lt  SFA disease, bilat PT and AT disease 01/11/2006   ALLERGIC RHINITIS 01/11/2006   SEBORRHEIC KERATOSIS 01/11/2006    Past Surgical History:  Procedure Laterality Date   CATARACT EXTRACTION W/PHACO Left 11/03/2015   Procedure: CATARACT EXTRACTION PHACO AND INTRAOCULAR LENS PLACEMENT ([place]);  Surgeon: Tonny Branch, MD;  Location: [facility];  Service: Ophthalmology;  Laterality: Left;  CDE: 7.74   CATARACT EXTRACTION W/PHACO Right 11/20/2015   Procedure: CATARACT EXTRACTION PHACO AND INTRAOCULAR LENS PLACEMENT ; CDE:  8.30;  Surgeon: Tonny Branch, MD;  Location: [facility];  Service: Ophthalmology;  Laterality: Right;   PORT-A-CATH REMOVAL      OB History   No obstetric history on file.      Home Medications    Prior to Admission medications   Medication Sig Start Date End Date Taking? Authorizing Provider  amLODipine (NORVASC) 2.5 MG tablet Take 2.5 mg by mouth daily. 01/04/15   [provider]  aspirin 81 MG tablet Take 81 mg by mouth daily.    [provider]  atorvastatin (LIPITOR) 40 MG tablet Take 40 mg by mouth daily. 01/07/15   [provider]  busPIRone (BUSPAR) 5 MG tablet Take 1 tablet (5 mg total) by mouth 2 (two) times daily. 04/08/20   Jacquelin Hawking, NP  cilostazol (PLETAL) 100 MG tablet Take 100 mg by mouth 2 (two) times daily.    [provider]  diclofenac (CATAFLAM) 50 MG tablet Take 1  tablet (50 mg total) by mouth 2 (two) times daily. 01/13/16   Carole Civil, MD  ferrous sulfate 325 (65 FE) MG EC tablet Take 1 tablet (325 mg total) by mouth daily with breakfast. 04/08/20   Jacquelin Hawking, NP  gabapentin (NEURONTIN) 300 MG capsule Take by mouth. 11/01/19   [provider]  hydrochlorothiazide (HYDRODIURIL) 12.5 MG tablet Take 12.5 mg by mouth daily. 11/06/19   [provider]  ibuprofen (ADVIL,MOTRIN) 800 MG tablet Take 800 mg by mouth every 8 (eight) hours as needed for moderate pain. 03/10/15   [provider]   loratadine (CLARITIN) 10 MG tablet Take 10 mg by mouth daily. 10/02/18   [provider]  losartan (COZAAR) 100 MG tablet Take 100 mg by mouth daily. 02/05/19   [provider]  meclizine (ANTIVERT) 25 MG tablet Take 25 mg by mouth 3 (three) times daily as needed. 05/25/21   [provider]  potassium chloride SA (KLOR-CON) 20 MEQ tablet Take 1 tablet (20 mEq total) by mouth daily. 12/31/19   Jacquelin Hawking, NP  temazepam (RESTORIL) 15 MG capsule Take 1 capsule (15 mg total) by mouth at bedtime as needed for sleep. 09/18/18   Lockamy, Randi L, NP-C  tiZANidine (ZANAFLEX) 2 MG tablet Take 2 mg by mouth 2 (two) times daily as needed. 12/08/20   [provider]    Family History Family History  Problem Relation Age of Onset   Diabetes Mother     Social History Social History   Tobacco Use   Smoking status: Former   Smokeless tobacco: Never  Scientific laboratory technician Use: Never used  Substance Use Topics   Alcohol use: No   Drug use: No     Allergies   Penicillins   Review of Systems Review of Systems Per HPI  Physical Exam Triage Vital Signs ED Triage Vitals  Enc Vitals Group     BP 08/05/21 1317 (!) 144/76     Pulse Rate 08/05/21 1317 (!) 116     Resp 08/05/21 1317 20     Temp 08/05/21 1317 (!) 100.6 F (38.1 C)     Temp Source 08/05/21 1317 Oral     SpO2 08/05/21 1317 (!) 89 %     Weight --      Height --      Head Circumference --      Peak Flow --      Pain Score 08/05/21 1316 10     Pain Loc --      Pain Edu? --      Excl. in [place]? --    No data found.  Updated Vital Signs BP (!) 144/76 (BP Location: Right Arm)   Pulse (!) 116   Temp (!) 100.6 F (38.1 C) (Oral)   Resp 20   SpO2 93%   Visual Acuity Right Eye Distance:   Left Eye Distance:   Bilateral Distance:    Right Eye Near:   Left Eye Near:    Bilateral Near:     Physical Exam Constitutional:      General: She is not in acute distress.    Appearance:  Normal appearance. She is ill-appearing. She is not toxic-appearing.     Comments: Patient sitting slumped over on exam table  HENT:     Mouth/Throat:     Mouth: Mucous membranes are moist.     Pharynx: Oropharynx is clear.  Cardiovascular:     Rate and Rhythm:  Regular rhythm. Tachycardia present.  Pulmonary:     Effort: Pulmonary effort is normal. No respiratory distress.     Breath sounds: Decreased air movement present. Decreased breath sounds present.  Abdominal:     General: Abdomen is flat. Bowel sounds are normal.     Palpations: Abdomen is soft.  Musculoskeletal:     Cervical back: Normal range of motion.  Lymphadenopathy:     Cervical: No cervical adenopathy.  Skin:    General: Skin is warm and dry.     Coloration: Skin is not jaundiced or pale.     Findings: No erythema.  Neurological:     Mental Status: She is alert and oriented to person, place, and time.  Psychiatric:        Behavior: Behavior is cooperative.      UC Treatments / Results  Labs (all labs ordered are listed, but only abnormal results are displayed) Labs Reviewed  POCT URINALYSIS DIP (MANUAL ENTRY) - Abnormal; Notable for the following components:      Result Value   Clarity, UA hazy (*)    Blood, UA moderate (*)    All other components within normal limits    EKG   Radiology No results found.  Procedures Procedures (including critical care time)  Medications Ordered in UC Medications - No data to display  Initial Impression / Assessment and Plan / UC Course  I have reviewed the triage vital signs and the nursing notes.  Pertinent labs & imaging results that were available during my care of the patient were reviewed by me and considered in my medical decision making (see chart for details).    Urinalysis today shows moderate amount of blood, otherwise no signs of urinary tract infection.  Her vital signs are unstable-she is tachycardic, oxygen saturation is less than 90% on room  air, and she has a low-grade fever.  I am concerned that she is showing signs of sepsis with acute respiratory fever, tachycardia, and is in a low grade fever.  Recommend further evaluation and management in the emergency room.  Patient and daughter are in agreement with this plan.  EMS called and transported patient by ambulance to the emergency room.  Final Clinical Impressions(s) / UC Diagnoses   Final diagnoses:  Acute respiratory failure with hypoxia (HCC)  Tachycardia  Fever, unspecified     Discharge Instructions      - Please go directly for the emergency room for further evaluation and treatment      ED Prescriptions   None    PDMP not reviewed this encounter.   Eulogio Bear, NP 08/05/21 [phone]

## 2021-08-05 NOTE — ED Provider Notes (Signed)
[facility] Provider Note   CSN: [id number] Arrival date & time: 08/05/21  1427     History  Chief Complaint  Patient presents with   Headache    Leslie Williamson is a 88 y.o. female.  HPI 88 year old female presents with a headache.  Last night developed a headache that was pretty severe.  It was global.  Try to get some Tylenol but can find any so she just toughed it out.  Headache was still there this morning and she ultimately went to urgent care.  Urgent care noted her high fever and tachycardia and borderline O2 sats.  She is also developed a cough since last night though no productive cough.  No shortness of breath.  No neck stiffness, vision changes, vomiting or any other pain.  Home Medications Prior to Admission medications   Medication Sig Start Date End Date Taking? Authorizing Provider  amLODipine (NORVASC) 2.5 MG tablet Take 2.5 mg by mouth daily. 01/04/15   [provider]  aspirin 81 MG tablet Take 81 mg by mouth daily.    [provider]  atorvastatin (LIPITOR) 40 MG tablet Take 40 mg by mouth daily. 01/07/15   [provider]  busPIRone (BUSPAR) 5 MG tablet Take 1 tablet (5 mg total) by mouth 2 (two) times daily. 04/08/20   Jacquelin Hawking, NP  cilostazol (PLETAL) 100 MG tablet Take 100 mg by mouth 2 (two) times daily.    [provider]  diclofenac (CATAFLAM) 50 MG tablet Take 1 tablet (50 mg total) by mouth 2 (two) times daily. 01/13/16   Carole Civil, MD  ferrous sulfate 325 (65 FE) MG EC tablet Take 1 tablet (325 mg total) by mouth daily with breakfast. 04/08/20   Jacquelin Hawking, NP  gabapentin (NEURONTIN) 300 MG capsule Take by mouth. 11/01/19   [provider]  hydrochlorothiazide (HYDRODIURIL) 12.5 MG tablet Take 12.5 mg by mouth daily. 11/06/19   [provider]  ibuprofen (ADVIL,MOTRIN) 800 MG tablet Take 800 mg by mouth every 8 (eight) hours as needed for moderate pain.  03/10/15   [provider]  loratadine (CLARITIN) 10 MG tablet Take 10 mg by mouth daily. 10/02/18   [provider]  losartan (COZAAR) 100 MG tablet Take 100 mg by mouth daily. 02/05/19   [provider]  meclizine (ANTIVERT) 25 MG tablet Take 25 mg by mouth 3 (three) times daily as needed. 05/25/21   [provider]  potassium chloride SA (KLOR-CON) 20 MEQ tablet Take 1 tablet (20 mEq total) by mouth daily. 12/31/19   Jacquelin Hawking, NP  temazepam (RESTORIL) 15 MG capsule Take 1 capsule (15 mg total) by mouth at bedtime as needed for sleep. 09/18/18   Lockamy, Randi L, NP-C  tiZANidine (ZANAFLEX) 2 MG tablet Take 2 mg by mouth 2 (two) times daily as needed. 12/08/20   [provider]      Allergies    Penicillins    Review of Systems   Review of Systems  Constitutional:  Positive for fever.  Respiratory:  Positive for cough. Negative for shortness of breath.   Cardiovascular:  Negative for chest pain.  Gastrointestinal:  Negative for abdominal pain and vomiting.  Musculoskeletal:  Negative for neck pain and neck stiffness.  Neurological:  Positive for headaches.    Physical Exam Updated Vital Signs BP (!) 144/69   Pulse 94   Temp 99.6 F (37.6 C) (Oral)   Resp 18  Ht '5\' 3"'$  (1.6 m)   Wt 69.1 kg   SpO2 99%   BMI 26.99 kg/m  Physical Exam Vitals and nursing note reviewed.  Constitutional:      General: She is not in acute distress.    Appearance: She is well-developed. She is not ill-appearing or diaphoretic.  HENT:     Head: Normocephalic and atraumatic.     Mouth/Throat:     Mouth: Mucous membranes are moist.  Eyes:     Pupils: Pupils are equal, round, and reactive to light.  Cardiovascular:     Rate and Rhythm: Normal rate and regular rhythm.     Heart sounds: Normal heart sounds.  Pulmonary:     Effort: Pulmonary effort is normal.     Breath sounds: Examination of the right-lower field reveals rales. Examination of the  left-lower field reveals rales. Rales present.  Abdominal:     Palpations: Abdomen is soft.     Tenderness: There is no abdominal tenderness.  Musculoskeletal:     Cervical back: Normal range of motion. No rigidity.  Skin:    General: Skin is warm and dry.  Neurological:     Mental Status: She is alert.     Comments: CN 3-12 grossly intact. 5/5 strength in all 4 extremities. Grossly normal sensation.      ED Results / Procedures / Treatments   Labs (all labs ordered are listed, but only abnormal results are displayed) Labs Reviewed  COMPREHENSIVE METABOLIC PANEL - Abnormal; Notable for the following components:      Result Value   Sodium 133 (*)    BUN 26 (*)    Creatinine, Ser 1.31 (*)    Calcium 8.8 (*)    Total Protein 8.3 (*)    Albumin 3.3 (*)    GFR, Estimated 39 (*)    All other components within normal limits  CBC WITH DIFFERENTIAL/PLATELET - Abnormal; Notable for the following components:   RBC 2.85 (*)    Hemoglobin 10.0 (*)    HCT 30.8 (*)    MCV 108.1 (*)    MCH 35.1 (*)    RDW 18.3 (*)    nRBC 0.9 (*)    Monocytes Absolute 1.8 (*)    All other components within normal limits  CULTURE, BLOOD (ROUTINE X 2)  CULTURE, BLOOD (ROUTINE X 2)  URINE CULTURE  RESP PANEL BY RT-PCR (FLU A&B, COVID) ARPGX2  LACTIC ACID, PLASMA  PROTIME-INR  APTT  LACTIC ACID, PLASMA  URINALYSIS, ROUTINE W REFLEX MICROSCOPIC    EKG EKG Interpretation  Date/Time:  Wednesday August 05 2021 14:47:53 EDT Ventricular Rate:  98 PR Interval:  181 QRS Duration: 106 QT Interval:  355 QTC Calculation: 454 R Axis:   -53 Text Interpretation: Sinus rhythm LAD, consider left anterior fascicular block Anterior infarct, old Confirmed by Sherwood Gambler [id number]) on 08/05/2021 5:12:19 PM  Radiology DG Chest 2 View  Result Date: 08/05/2021 CLINICAL DATA:  Cough and fever. EXAM: CHEST - 2 VIEW COMPARISON:  Chest x-ray 03/08/2016. FINDINGS: Lung volumes are low. The heart is mildly enlarged,  unchanged. The mediastinal silhouette is within normal limits. Both lungs are clear. The visualized skeletal structures are unremarkable. IMPRESSION: No active cardiopulmonary disease. Stable cardiomegaly. Electronically Signed   By: Ronney Asters M.D.   On: 08/05/2021 16:27    Procedures Procedures    Medications Ordered in ED Medications  azithromycin (ZITHROMAX) 500 mg in sodium chloride 0.9 % 250 mL IVPB (has no administration in time range)  lactated ringers bolus 1,000 mL (1,000 mLs Intravenous Bolus 08/05/21 1555)  acetaminophen (TYLENOL) tablet 650 mg (650 mg Oral Given 08/05/21 1555)  cefTRIAXone (ROCEPHIN) 1 g in sodium chloride 0.9 % 100 mL IVPB (1 g Intravenous New Bag/Given 08/05/21 1716)    ED Course/ Medical Decision Making/ A&P                           Medical Decision Making Amount and/or Complexity of Data Reviewed External Data Reviewed: notes. Labs: ordered. Radiology: ordered and independent interpretation performed. ECG/medicine tests: ordered and independent interpretation performed.  Risk OTC drugs.   Patient presents with acute hypoxic respiratory failure.  She is not distressed but her sats have gone as low as 86% on room air so I placed her on 2 L.  Otherwise with her productive cough and a fever at urgent care I suspect she is developing pneumonia.  Has some mild Rales at the bases.  However she has not distress and her lab work is fairly unremarkable besides a mild kidney injury with a creatinine of 1.3.  WBC and lactate are normal.  I personally viewed/interpreted the chest x-ray images which do not show an overt pneumonia.  ECG shows no acute ischemia on my interpretation.  She was given IV fluid bolus, Tylenol, both of which have helped her headache.  Otherwise I have given her IV Rocephin and azithromycin for the pneumonia.  I discussed with Dr. Denton Brick, who will admit.  Given there is still some uncertainty with the hypoxia she requests a CT angiography  of the chest which I have ordered.        Final Clinical Impression(s) / ED Diagnoses Final diagnoses:  Acute respiratory failure with hypoxia [facility])    Rx / DC Orders ED Discharge Orders     None         Sherwood Gambler, MD 08/05/21 1752

## 2021-08-05 NOTE — Assessment & Plan Note (Signed)
Cr 1.3, close to baseline-  0.9 - 1.2.  - Hydrate -Contrast exposure today -Hold HCTZ, losartan

## 2021-08-05 NOTE — Discharge Instructions (Addendum)
-   Please go directly for the emergency room for further evaluation and treatment

## 2021-08-05 NOTE — ED Notes (Signed)
Pt eating food from outside source

## 2021-08-05 NOTE — ED Triage Notes (Signed)
Pt reports headache, chills and feeling funning when urinating x 1 day. States she pee on herself last night, she was not able to hold the urine.

## 2021-08-05 NOTE — ED Notes (Signed)
Ems at bedside. Report given.

## 2021-08-05 NOTE — Assessment & Plan Note (Addendum)
Blood pressure soft initially -Hold Norvasc, HCTZ, losartan. -Hydrated -restart amlodipine>>increase to 5 mg daily Will not restart HCTZ or losartan at time of d/c Follow up with PCP

## 2021-08-05 NOTE — ED Notes (Signed)
Patient is being discharged from the Urgent Care and sent to the Emergency Department via EMS . Per NP, patient is in need of higher level of care due to possible sepsis. Patient is aware and verbalizes understanding of plan of care.  Vitals:   08/05/21 1317 08/05/21 1351  BP: (!) 144/76   Pulse: (!) 116   Resp: 20   Temp: (!) 100.6 F (38.1 C)   SpO2: (!) 89% 93%

## 2021-08-05 NOTE — Assessment & Plan Note (Addendum)
-  start solumedrol IV>>d/c home with prednisone 50 mg daily x 5 more days -Cannot get Paxlovid due to renal insufficiency, chat with pharmacy.Molunupiravir started. - Albuterol inhaler PRN and scheduled, mucolytics -1 L bolus given, continue N/s 75cc/hr x 20hrs - PCT 0.15 - CRP 2.6>>1.9 -ferritin 106>>126 -D-dimer 1.56>>0.91

## 2021-08-05 NOTE — ED Notes (Signed)
EMS notified and en route. Called report to [facility] ED, spoke with Megan,RN.

## 2021-08-05 NOTE — ED Notes (Signed)
Pt also c/o new urinary symptoms such urgency; However, denies painful urination

## 2021-08-05 NOTE — H&P (Addendum)
History and Physical    Leslie Williamson EZM:[id number] DOB: 03-11-1931 DOA: 08/05/2021  PCP: Carrolyn Meiers, MD   Patient coming from: Home  I have personally briefly reviewed patient's old medical records in [place]  Chief Complaint: Cough, urinary incontinence  HPI: Leslie Williamson is a 88 y.o. female with medical history significant for hypertension, non-Hodgkin's lymphoma, peripheral vascular disease, depression and anxiety. Patient was brought to the ED with reports of headache, chills, funny feeling when urinating, with urinary incontinence and dry cough that all started last night.  She denies difficulty breathing.  No vomiting no loose stools.  No neck pain.  Presented at an urgent care, she was tachycardic to 116, and it had a fever of 100.6, she was referred to the ED for higher level of care due to concern for sepsis of urinary respiratory status. UA done at urgent care was not suggestive of infection.  ED Course: Tmax 99.6.  Heart rate 92-107.  Respirate rate 18-25.  Blood pressure systolic 503-546.  O2 sats down to 87% on room air, placed on 2 L. Lactic acid 1.2.  WBC 7.9.  Chest x-ray without acute abnormality.  COVID test finally came back positive. IV ceftriaxone and azithromycin started for possible pneumonia.  CTA chest -no PE, extensive multivessel coronary artery calcification.  Review of Systems: As per HPI all other systems reviewed and negative.  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 She has never used smokeless tobacco. She reports that she does not drink alcohol and does not use drugs.  Allergies  Allergen Reactions   Penicillins Rash    Family History  Problem Relation Age of Onset   Diabetes Mother     Prior to Admission medications   Medication Sig Start Date End Date Taking? Authorizing Provider  amLODipine (NORVASC) 2.5 MG tablet Take 2.5 mg by mouth daily. 01/04/15   [provider]  aspirin 81 MG tablet Take 81 mg by mouth daily.    [provider]  atorvastatin (LIPITOR) 40 MG tablet Take 40 mg by mouth daily. 01/07/15   [provider]  busPIRone (BUSPAR) 5 MG tablet Take 1 tablet (5 mg total) by mouth 2 (two) times daily. 04/08/20   Jacquelin Hawking, NP  cilostazol (PLETAL) 100 MG tablet Take  100 mg by mouth 2 (two) times daily.    [provider]  diclofenac (CATAFLAM) 50 MG tablet Take 1 tablet (50 mg total) by mouth 2 (two) times daily. 01/13/16   Carole Civil, MD  ferrous sulfate 325 (65 FE) MG EC tablet Take 1 tablet (325 mg total) by mouth daily with breakfast. 04/08/20   Jacquelin Hawking, NP  gabapentin (NEURONTIN) 300 MG capsule Take by mouth. 11/01/19   [provider]  hydrochlorothiazide (HYDRODIURIL) 12.5 MG tablet Take 12.5 mg by mouth daily. 11/06/19   [provider]   ibuprofen (ADVIL,MOTRIN) 800 MG tablet Take 800 mg by mouth every 8 (eight) hours as needed for moderate pain. 03/10/15   [provider]  loratadine (CLARITIN) 10 MG tablet Take 10 mg by mouth daily. 10/02/18   [provider]  losartan (COZAAR) 100 MG tablet Take 100 mg by mouth daily. 02/05/19   [provider]  meclizine (ANTIVERT) 25 MG tablet Take 25 mg by mouth 3 (three) times daily as needed. 05/25/21   [provider]  potassium chloride SA (KLOR-CON) 20 MEQ tablet Take 1 tablet (20 mEq total) by mouth daily. 12/31/19   Jacquelin Hawking, NP  temazepam (RESTORIL) 15 MG capsule Take 1 capsule (15 mg total) by mouth at bedtime as needed for sleep. 09/18/18   Lockamy, Randi L, NP-C  tiZANidine (ZANAFLEX) 2 MG tablet Take 2 mg by mouth 2 (two) times daily as needed. 12/08/20   [provider]    Physical Exam: Vitals:   08/05/21 1645 08/05/21 1700 08/05/21 1715 08/05/21 1730  BP:    (!) 144/69  Pulse: 92 93 96 94  Resp: '18 20 19 18  '$ Temp:      TempSrc:      SpO2: (!) 87% 98% 99% 99%  Weight:      Height:        Constitutional: NAD, calm, comfortable Vitals:   08/05/21 1645 08/05/21 1700 08/05/21 1715 08/05/21 1730  BP:    (!) 144/69  Pulse: 92 93 96 94  Resp: '18 20 19 18  '$ Temp:      TempSrc:      SpO2: (!) 87% 98% 99% 99%  Weight:      Height:       Eyes: PERRL, lids and conjunctivae normal ENMT: Mucous membranes are moist.  Neck: normal, supple, no masses, no thyromegaly Respiratory: clear to auscultation bilaterally, no wheezing, no crackles. Normal respiratory effort. No accessory muscle use.  Cardiovascular: Regular rate and rhythm, no murmurs / rubs / gallops. No extremity edema.  Lower extremities warm. Abdomen: no tenderness, no masses palpated. No hepatosplenomegaly. Bowel sounds positive.  Musculoskeletal: no clubbing / cyanosis. No joint deformity upper and lower extremities. Good ROM, no contractures. Normal muscle tone.   Skin: no rashes, lesions, ulcers. No induration Neurologic: No apparent cranial nerve abnormality, moving extremities spontaneously. Psychiatric: Normal judgment and insight. Alert and oriented x 3. Normal mood.   Labs on Admission: I have personally reviewed following labs and imaging studies  CBC: Recent Labs  Lab 08/05/21 1548  WBC 7.9  NEUTROABS 4.6  HGB 10.0*  HCT 30.8*  MCV 108.1*  PLT 270   Basic Metabolic Panel: Recent Labs  Lab 08/05/21 1548  NA 133*  K 3.7  CL 101  CO2 24  GLUCOSE 86  BUN 26*  CREATININE 1.31*  CALCIUM 8.8*   Liver Function Tests: Recent Labs  Lab 08/05/21 1548  AST 16  ALT 11  ALKPHOS 55  BILITOT 0.5  PROT 8.3*  ALBUMIN 3.3*   Coagulation Profile: Recent Labs  Lab 08/05/21 1548  INR 1.1   Radiological Exams on Admission: DG Chest 2 View  Result Date: 08/05/2021 CLINICAL DATA:  Cough and fever. EXAM: CHEST - 2 VIEW COMPARISON:  Chest x-ray 03/08/2016. FINDINGS: Lung volumes are low. The heart is mildly enlarged, unchanged. The mediastinal silhouette is within normal limits. Both lungs are clear. The visualized skeletal structures are unremarkable. IMPRESSION: No active cardiopulmonary disease. Stable cardiomegaly. Electronically Signed   By: Ronney Asters M.D.   On: 08/05/2021 16:27    EKG: Independently reviewed.  Sinus rhythm rate 98.  QTc 454.  LAFB.  No significant change from prior.  Assessment/Plan Principal Problem:   Acute respiratory failure with hypoxia (HCC) Active Problems:   Essential hypertension   SIRS (systemic inflammatory response syndrome) (HCC)   COVID-19 virus infection   Chronic kidney disease, stage 3a (HCC)   Assessment and Plan: * Acute respiratory failure with hypoxia (HCC) O2 sats down to 87% on room air, placed on 2 L.  Hypoxia likely related to COVID-19 infection, on my evaluation no bronchospasms, and CTA chest is negative for PE or acute abnormalities. -Supplemental O2 reassess in  a.m.  Chronic kidney disease, stage 3a (HCC) Cr 1.3, close to baseline-  0.9 - 1.2.  - Hydrate -Contrast exposure today -Hold HCTZ, losartan  COVID-19 virus infection Presenting with SIRS-fever of 100.6, and tachycardia heart rate 92-107.  Reports cough and urinary symptoms.  UA is suggestive of infection.  CTA chest negative for PE or infiltrates. -Patient's son- James-tells me patient got all her COVID-19 vaccines, but he does not know how many, she also as far as he knows has never had COVID infection. -Cannot get Paxlovid due to renal insufficiency, chat with pharmacy.Molunupiravir started. -Conservative management - Albuterol inhaler PRN and scheduled, mucolytics -1 L bolus given, continue N/s 75cc/hr x 20hrs -  hold off on further antibiotics at this time -Obtain and trend inflammatory markers   Essential hypertension Blood pressure soft. -Hold Norvasc, HCTZ, losartan. -Hydrate   DVT prophylaxis: Heparin Code Status: FULL -confirmed with son and daughter-in-law at bedside. Family Communication: Patient's son Jeneen Rinks Associate Professor) and his wife present at bedside, also updated on the phone about patient's COVID positive status Disposition Plan: ~ 1 - 2 days Consults called: None Admission status: Inpt Tele-inpatient admission orders initially placed for possible sepsis , before COVID positive status resulted later.   I certify that at the point of admission it is my clinical judgment that the patient will require inpatient hospital care spanning beyond 2 midnights from the point of admission due to high intensity of service, high risk for further deterioration and high frequency of surveillance required.    Author: Bethena Roys, MD 08/05/2021 8:59 PM  For on call review [email or web address].

## 2021-08-05 NOTE — ED Notes (Signed)
Patient transported to CT 

## 2021-08-05 NOTE — ED Triage Notes (Signed)
Pt arrived via [facility] from urgent care with c/o headached that started lastnight that woke her out of her sleep. Urgent care called EMS due to fever and tachycardia. Pt also c/o cough that started lastnight as well

## 2021-08-05 NOTE — Assessment & Plan Note (Addendum)
O2 sats down to 87% on room air with tachypnea,  -placed on 2 L. >>weaned to RA -6/24 CTA chest neg for PE, neg consolidation -ambulatory pulse ox on day of d/c did not show desaturation <90%

## 2021-08-06 ENCOUNTER — Encounter (HOSPITAL_COMMUNITY): Payer: Self-pay | Admitting: Internal Medicine

## 2021-08-06 DIAGNOSIS — I10 Essential (primary) hypertension: Secondary | ICD-10-CM | POA: Diagnosis not present

## 2021-08-06 DIAGNOSIS — D473 Essential (hemorrhagic) thrombocythemia: Secondary | ICD-10-CM

## 2021-08-06 DIAGNOSIS — D472 Monoclonal gammopathy: Secondary | ICD-10-CM

## 2021-08-06 DIAGNOSIS — N179 Acute kidney failure, unspecified: Secondary | ICD-10-CM | POA: Diagnosis not present

## 2021-08-06 DIAGNOSIS — N1831 Chronic kidney disease, stage 3a: Secondary | ICD-10-CM

## 2021-08-06 DIAGNOSIS — J9601 Acute respiratory failure with hypoxia: Secondary | ICD-10-CM | POA: Diagnosis not present

## 2021-08-06 DIAGNOSIS — U071 COVID-19: Secondary | ICD-10-CM | POA: Diagnosis not present

## 2021-08-06 LAB — CBC WITH DIFFERENTIAL/PLATELET
Abs Immature Granulocytes: 0.03 10*3/uL (ref 0.00–0.07)
Basophils Absolute: 0 10*3/uL (ref 0.0–0.1)
Basophils Relative: 0 %
Eosinophils Absolute: 0 10*3/uL (ref 0.0–0.5)
Eosinophils Relative: 0 %
HCT: 28.6 % — ABNORMAL LOW (ref 36.0–46.0)
Hemoglobin: 9.3 g/dL — ABNORMAL LOW (ref 12.0–15.0)
Immature Granulocytes: 0 %
Lymphocytes Relative: 24 %
Lymphs Abs: 1.7 10*3/uL (ref 0.7–4.0)
MCH: 35.6 pg — ABNORMAL HIGH (ref 26.0–34.0)
MCHC: 32.5 g/dL (ref 30.0–36.0)
MCV: 109.6 fL — ABNORMAL HIGH (ref 80.0–100.0)
Monocytes Absolute: 1.8 10*3/uL — ABNORMAL HIGH (ref 0.1–1.0)
Monocytes Relative: 24 %
Neutro Abs: 3.7 10*3/uL (ref 1.7–7.7)
Neutrophils Relative %: 52 %
Platelets: 248 10*3/uL (ref 150–400)
RBC: 2.61 MIL/uL — ABNORMAL LOW (ref 3.87–5.11)
RDW: 18.6 % — ABNORMAL HIGH (ref 11.5–15.5)
WBC: 7.3 10*3/uL (ref 4.0–10.5)
nRBC: 1 % — ABNORMAL HIGH (ref 0.0–0.2)

## 2021-08-06 LAB — COMPREHENSIVE METABOLIC PANEL
ALT: 12 U/L (ref 0–44)
AST: 19 U/L (ref 15–41)
Albumin: 2.8 g/dL — ABNORMAL LOW (ref 3.5–5.0)
Alkaline Phosphatase: 48 U/L (ref 38–126)
Anion gap: 6 (ref 5–15)
BUN: 25 mg/dL — ABNORMAL HIGH (ref 8–23)
CO2: 22 mmol/L (ref 22–32)
Calcium: 8.3 mg/dL — ABNORMAL LOW (ref 8.9–10.3)
Chloride: 107 mmol/L (ref 98–111)
Creatinine, Ser: 1.08 mg/dL — ABNORMAL HIGH (ref 0.44–1.00)
GFR, Estimated: 49 mL/min — ABNORMAL LOW (ref >60)
Glucose, Bld: 73 mg/dL (ref 70–99)
Potassium: 3.8 mmol/L (ref 3.5–5.1)
Sodium: 135 mmol/L (ref 135–145)
Total Bilirubin: 0.4 mg/dL (ref 0.3–1.2)
Total Protein: 7.2 g/dL (ref 6.5–8.1)

## 2021-08-06 LAB — URINE CULTURE: Culture: NO GROWTH

## 2021-08-06 LAB — C-REACTIVE PROTEIN
CRP: 1.3 mg/dL — ABNORMAL HIGH (ref <1.0)
CRP: 2.6 mg/dL — ABNORMAL HIGH (ref <1.0)

## 2021-08-06 LAB — PHOSPHORUS: Phosphorus: 3.6 mg/dL (ref 2.5–4.6)

## 2021-08-06 LAB — D-DIMER, QUANTITATIVE: D-Dimer, Quant: 1.14 ug/mL-FEU — ABNORMAL HIGH (ref 0.00–0.50)

## 2021-08-06 LAB — MAGNESIUM: Magnesium: 2 mg/dL (ref 1.7–2.4)

## 2021-08-06 LAB — FERRITIN: Ferritin: 106 ng/mL (ref 11–307)

## 2021-08-06 MED ORDER — BOOST / RESOURCE BREEZE PO LIQD CUSTOM
1.0000 | Freq: Three times a day (TID) | ORAL | Status: DC
  Administered 2021-08-06 – 2021-08-07 (×3): 1 via ORAL

## 2021-08-06 MED ORDER — METHYLPREDNISOLONE SODIUM SUCC 40 MG IJ SOLR
40.0000 mg | Freq: Two times a day (BID) | INTRAMUSCULAR | Status: AC
  Administered 2021-08-06 – 2021-08-07 (×3): 40 mg via INTRAVENOUS
  Filled 2021-08-06 (×3): qty 1

## 2021-08-06 NOTE — Progress Notes (Signed)
Pt has been very pleasant and has slept quietly throughout the night since receiving a one time dose of Melatonin 6 mg po at 0025. She has not voiced any concerns and denies pain. Her cough remains unproductive.

## 2021-08-06 NOTE — Assessment & Plan Note (Addendum)
-  close to baseline-  0.9 - 1.0.  -presented with serum creatinine 1.31 - Hydrate -Contrast exposure 6/14 -Hold HCTZ, losartan>>will not restart -serum creatinine 1.04 at time of d/c

## 2021-08-06 NOTE — Progress Notes (Signed)
PROGRESS NOTE  DELENN AHN OEU:[id number] DOB: 1932-01-28 DOA: 08/05/2021 PCP: Carrolyn Meiers, MD  Brief History:  88 year old female with a history of non-Hodgkin's lymphoma (Diagnosed in 1999, status post CHOP therapy x7 cycles followed by rituximab for 1 cycle) s/p splenectomy, hypertension, peripheral vascular disease, anxiety presenting with dizziness, headache, and nonproductive cough that began on the evening of 08/04/2021.  The patient also has some subjective fevers and chills.  She denies any chest pain, shortness of breath, hemoptysis, visual disturbance.  There is no nausea, vomiting, diarrhea, abdominal pain, dysuria.  The patient went to urgent care on 08/05/2021 where she was noted to have a temperature of 100.6 F with tachycardia.  She was sent to the emergency department for further evaluation and treatment.  She also endorses poor p.o. intake for the last 24 hours. In the ED, the patient had a low-grade temperature of 100.6 F with tachycardia and tachypnea.  She was hemodynamically stable.  Oxygen saturation was 87% on room air.  BMP showed a sodium 133, potassium 3.7, serum creatinine 1.31, bicarbonate 24.  LFTs were unremarkable.  WBC 7.9, hemoglobin 10.0, platelets 284,000.  UA was negative for pyuria.  D-dimer was elevated at 1.56.  CTA chest was obtained and was negative for PE.  Showed trace pericardial effusion, bibasilar atelectasis without any infiltrates or edema.  Her COVID-19 PCR was positive.  The patient was admitted for further evaluation and treatment.     Assessment and Plan: * Acute respiratory failure with hypoxia (HCC) O2 sats down to 87% on room air with tachypnea,  -placed on 2 L.  -6/24 CTA chest neg for PE, neg consolidation -Wean oxygen down to room air  MGUS (monoclonal gammopathy of unknown significance) Followed by [facility] hematology clinic Pt has refused BM biopsy, prefers observation  Essential thrombocytosis ([place]) Followed  by [facility] hematology clin[facility] Continue ASA 81 mg daily Previously on hydrea 500 mg MWF>>stopped on 07/29/21 visit Has refused BM biopsy  Acute renal failure superimposed on stage 3a chronic kidney disease ([place]) -close to baseline-  0.9 - 1.0.  -presented with serum creatinine 1.31 - Hydrate -Contrast exposure 6/14 -Hold HCTZ, losartan temporarily  COVID-19 virus infection -start solumedrol IV -Cannot get Paxlovid due to renal insufficiency, chat with pharmacy.Molunupiravir started. - Albuterol inhaler PRN and scheduled, mucolytics -1 L bolus given, continue N/s 75cc/hr x 20hrs - PCT 0.15 - CRP 2.6 -D-dimer 1.56   Essential hypertension Blood pressure soft. -Hold Norvasc, HCTZ, losartan. -Hydrate      Family Communication:  no Family at bedside  Consultants:  none  Code Status:  FULL   DVT Prophylaxis:  [place] Heparin    Procedures: As Listed in Progress Note Above  Antibiotics: None     Subjective: She complains of some nausea.  Denies f/c, cp, vomiting, diarrhea, abd pain.  No hematochezia, no melena  Objective: Vitals:   08/05/21 2116 08/05/21 2220 08/06/21 0012 08/06/21 0412  BP:   137/64 (!) 144/63  Pulse:   88 98  Resp:   18 18  Temp:   (!) 97.5 F (36.4 C) (!) 100.5 F (38.1 C)  TempSrc:   Oral   SpO2: 95% 95% 100% 97%  Weight:      Height:        Intake/Output Summary (Last 24 hours) at 08/06/2021 1019 Last data filed at 08/06/2021 0500 Gross per 24 hour  Intake 823.87 ml  Output --  Net 823.87 ml  Weight change:  Exam:  General:  Pt is alert, follows commands appropriately, not in acute distress HEENT: No icterus, No thrush, No neck mass, [place]/AT Cardiovascular: RRR, S1/S2, no rubs, no gallops Respiratory: bibasilar rales. No wheeze Abdomen: Soft/+BS, non tender, non distended, no guarding Extremities: No edema, No lymphangitis, No petechiae, No rashes, no synovitis   Data Reviewed: I have personally reviewed following labs and imaging  studies Basic Metabolic Panel: Recent Labs  Lab 08/05/21 1548 08/06/21 0435  NA 133* 135  K 3.7 3.8  CL 101 107  CO2 24 22  GLUCOSE 86 73  BUN 26* 25*  CREATININE 1.31* 1.08*  CALCIUM 8.8* 8.3*  MG  --  2.0  PHOS  --  3.6   Liver Function Tests: Recent Labs  Lab 08/05/21 1548 08/06/21 0435  AST 16 19  ALT 11 12  ALKPHOS 55 48  BILITOT 0.5 0.4  PROT 8.3* 7.2  ALBUMIN 3.3* 2.8*   No results for input(s): "LIPASE", "AMYLASE" in the last 168 hours. No results for input(s): "AMMONIA" in the last 168 hours. Coagulation Profile: Recent Labs  Lab 08/05/21 1548  INR 1.1   CBC: Recent Labs  Lab 08/05/21 1548 08/06/21 0435  WBC 7.9 7.3  NEUTROABS 4.6 3.7  HGB 10.0* 9.3*  HCT 30.8* 28.6*  MCV 108.1* 109.6*  PLT 284 248   Cardiac Enzymes: No results for input(s): "CKTOTAL", "CKMB", "CKMBINDEX", "TROPONINI" in the last 168 hours. BNP: Invalid input(s): "POCBNP" CBG: Recent Labs  Lab 08/05/21 2326  GLUCAP 89   HbA1C: No results for input(s): "HGBA1C" in the last 72 hours. Urine analysis:    Component Value Date/Time   COLORURINE YELLOW 08/05/2021 1900   APPEARANCEUR CLEAR 08/05/2021 1900   LABSPEC 1.016 08/05/2021 1900   PHURINE 5.0 08/05/2021 1900   GLUCOSEU NEGATIVE 08/05/2021 1900   HGBUR MODERATE (A) 08/05/2021 1900   HGBUR large 06/19/2007 0845   BILIRUBINUR NEGATIVE 08/05/2021 1900   BILIRUBINUR negative 08/05/2021 1334   KETONESUR NEGATIVE 08/05/2021 1900   PROTEINUR NEGATIVE 08/05/2021 1900   UROBILINOGEN 0.2 08/05/2021 1334   UROBILINOGEN 0.2 07/03/2012 1929   NITRITE NEGATIVE 08/05/2021 1900   LEUKOCYTESUR NEGATIVE 08/05/2021 1900   Sepsis Labs: $[id number]'@[id number]$ (procalcitonin:4,lacticidven:4) ) Recent Results (from the past 240 hour(s))  Resp Panel by RT-PCR (Flu A&B, Covid) Anterior Nasal Swab     Status: Abnormal   Collection Time: 08/05/21  3:17 PM   Specimen: Anterior Nasal Swab  Result Value Ref Range Status   SARS Coronavirus 2 by RT  PCR POSITIVE (A) NEGATIVE Final    Comment: (NOTE) SARS-CoV-2 target nucleic acids are DETECTED.  The SARS-CoV-2 RNA is generally detectable in upper respiratory specimens during the acute phase of infection. Positive results are indicative of the presence of the identified virus, but do not rule out bacterial infection or co-infection with other pathogens not detected by the test. Clinical correlation with patient history and other diagnostic information is necessary to determine patient infection status. The expected result is Negative.  Fact Sheet for Patients: EntrepreneurPulse.com.au  Fact Sheet for Healthcare Providers: IncredibleEmployment.be  This test is not yet approved or cleared by the [place] FDA and  has been authorized for detection and/or diagnosis of SARS-CoV-2 by FDA under an Emergency Use Authorization (EUA).  This EUA will remain in effect (meaning this test can be used) for the duration of  the COVID-19 declaration under Section 564(b)(1) of the A ct, 21 U.S.C. section 360bbb-3(b)(1), unless the authorization is terminated or revoked  sooner.     Influenza A by PCR NEGATIVE NEGATIVE Final   Influenza B by PCR NEGATIVE NEGATIVE Final    Comment: (NOTE) The Xpert Xpress SARS-CoV-2/FLU/RSV plus assay is intended as an aid in the diagnosis of influenza from Nasopharyngeal swab specimens and should not be used as a sole basis for treatment. Nasal washings and aspirates are unacceptable for Xpert Xpress SARS-CoV-2/FLU/RSV testing.  Fact Sheet for Patients: EntrepreneurPulse.com.au  Fact Sheet for Healthcare Providers: IncredibleEmployment.be  This test is not yet approved or cleared by the [place] FDA and has been authorized for detection and/or diagnosis of SARS-CoV-2 by FDA under an Emergency Use Authorization (EUA). This EUA will remain in effect (meaning this test can be  used) for the duration of the COVID-19 declaration under Section 564(b)(1) of the Act, 21 U.S.C. section 360bbb-3(b)(1), unless the authorization is terminated or revoked.  Performed at [facility], [address], [place], [place] [address]   Blood Culture (routine x 2)     Status: None (Preliminary result)   Collection Time: 08/05/21  3:20 PM   Specimen: Right Antecubital; Blood  Result Value Ref Range Status   Specimen Description RIGHT ANTECUBITAL  Final   Special Requests   Final    BOTTLES DRAWN AEROBIC AND ANAEROBIC Blood Culture results may not be optimal due to an inadequate volume of blood received in culture bottles   Culture   Final    NO GROWTH < 24 HOURS Performed at [facility], [address] [place], [place] [address]    Report Status PENDING  Incomplete  Blood Culture (routine x 2)     Status: None (Preliminary result)   Collection Time: 08/05/21  3:49 PM   Specimen: BLOOD LEFT ARM  Result Value Ref Range Status   Specimen Description BLOOD LEFT ARM  Final   Special Requests   Final    BOTTLES DRAWN AEROBIC AND ANAEROBIC Blood Culture adequate volume   Culture   Final    NO GROWTH < 24 HOURS Performed at [facility], [address], [place], [place] [address]    Report Status PENDING  Incomplete     Scheduled Meds:  busPIRone  5 mg Oral BID   feeding supplement  237 mL Oral BID BM   guaiFENesin-dextromethorphan  10 mL Oral Q8H   heparin  5,000 Units Subcutaneous Q8H   methylPREDNISolone (SOLU-MEDROL) injection  40 mg Intravenous Q12H   molnupiravir EUA  4 capsule Oral BID   Continuous Infusions:  sodium chloride 75 mL/hr at 08/06/21 0500    Procedures/Studies: CT Angio Chest PE W and/or Wo Contrast  Result Date: 08/05/2021 CLINICAL DATA:  Pulmonary embolism (PE) suspected, high prob. Fever, tachycardia, hypoxia EXAM: CT ANGIOGRAPHY CHEST WITH CONTRAST TECHNIQUE: Multidetector CT imaging of the chest was performed using the standard protocol during  bolus administration of intravenous contrast. Multiplanar CT image reconstructions and MIPs were obtained to evaluate the vascular anatomy. RADIATION DOSE REDUCTION: This exam was performed according to the departmental dose-optimization program which includes automated exposure control, adjustment of the mA and/or kV according to patient size and/or use of iterative reconstruction technique. CONTRAST:  78m OMNIPAQUE IOHEXOL 350 MG/ML SOLN COMPARISON:  None Available. FINDINGS: Cardiovascular: Extensive multi-vessel coronary artery calcification. Global cardiac size within normal limits. Trace pericardial effusion. The central pulmonary arteries are of normal caliber. No intraluminal filling defect identified to suggest acute pulmonary embolism. Mild atherosclerotic calcification within the thoracic aorta. No aortic aneurysm. Mediastinum/Nodes: No enlarged mediastinal, hilar, or axillary  lymph nodes. Thyroid gland, trachea, and esophagus demonstrate no significant findings. Small hiatal hernia. Lungs/Pleura: Mild bibasilar atelectasis. Lungs are otherwise clear. No pneumothorax or pleural effusion. No central obstructing mass. Upper Abdomen: Status post cholecystectomy.  No acute abnormality. Musculoskeletal: No acute bone abnormality. No lytic or blastic bone lesion. Review of the MIP images confirms the above findings. IMPRESSION: 1. No pulmonary embolism. 2. Extensive multi-vessel coronary artery calcification. Aortic Atherosclerosis (ICD10-I70.0). Electronically Signed   By: Fidela Salisbury M.D.   On: 08/05/2021 20:15   DG Chest 2 View  Result Date: 08/05/2021 CLINICAL DATA:  Cough and fever. EXAM: CHEST - 2 VIEW COMPARISON:  Chest x-ray 03/08/2016. FINDINGS: Lung volumes are low. The heart is mildly enlarged, unchanged. The mediastinal silhouette is within normal limits. Both lungs are clear. The visualized skeletal structures are unremarkable. IMPRESSION: No active cardiopulmonary disease. Stable  cardiomegaly. Electronically Signed   By: Ronney Asters M.D.   On: 08/05/2021 16:27   DG Bone Survey Met  Result Date: 07/30/2021 CLINICAL DATA:  MGUS EXAM: METASTATIC BONE SURVEY COMPARISON:  Bone survey 06/12/2020. FINDINGS: No focal osseous lesions are identified.  No acute fracture. Degenerative changes of the lumbar spine with grade 1 anterolisthesis at L3-L4 appears unchanged. There are surgical clips in the abdomen and right neck. There are atherosclerotic calcifications of the aorta. Peripheral vascular calcifications are also present. IMPRESSION: 1. No osseous lesions are identified. Electronically Signed   By: Ronney Asters M.D.   On: 07/30/2021 15:36    Orson Eva, DO  [facility]  If 7PM-7AM, please contact night-coverage [email or web address] Password [email or web address] 08/06/2021, 10:19 AM   LOS: 1 day

## 2021-08-06 NOTE — Progress Notes (Signed)
  Transition of Care [facility]) Screening Note   Patient Details  Name: Leslie Williamson Date of Birth: Oct 15, 1931   Transition of Care [facility]) CM/SW Contact:    Ihor Gully, LCSW Phone Number: 08/06/2021, 2:32 PM    Transition of Care Department [facility]) has reviewed patient and no TOC needs have been identified at this time. We will continue to monitor patient advancement through interdisciplinary progression rounds. If new patient transition needs arise, please place a TOC consult.

## 2021-08-06 NOTE — Hospital Course (Addendum)
88 year old female with a history of non-Hodgkin's lymphoma (Diagnosed in 1999, status post CHOP therapy x7 cycles followed by rituximab for 1 cycle) s/p splenectomy, hypertension, peripheral vascular disease, anxiety presenting with dizziness, headache, and nonproductive cough that began on the evening of 08/04/2021.  The patient also has some subjective fevers and chills.  She denies any chest pain, shortness of breath, hemoptysis, visual disturbance.  There is no nausea, vomiting, diarrhea, abdominal pain, dysuria.  The patient went to urgent care on 08/05/2021 where she was noted to have a temperature of 100.6 F with tachycardia.  She was sent to the emergency department for further evaluation and treatment.  She also endorses poor p.o. intake for the last 24 hours. In the ED, the patient had a low-grade temperature of 100.6 F with tachycardia and tachypnea.  She was hemodynamically stable.  Oxygen saturation was 87% on room air.  BMP showed a sodium 133, potassium 3.7, serum creatinine 1.31, bicarbonate 24.  LFTs were unremarkable.  WBC 7.9, hemoglobin 10.0, platelets 284,000.  UA was negative for pyuria.  D-dimer was elevated at 1.56.  CTA chest was obtained and was negative for PE.  Showed trace pericardial effusion, bibasilar atelectasis without any infiltrates or edema.  Her COVID-19 PCR was positive.  The patient was admitted for further evaluation and treatment.

## 2021-08-06 NOTE — Progress Notes (Signed)
Initial Nutrition Assessment  DOCUMENTATION CODES:      INTERVENTION:  Discontinue Ensure (patient doesn't like)  Add Magic cup with meals  Regular diet   NUTRITION DIAGNOSIS:   Inadequate oral intake related to acute illness (COVID-19 viral infection) as evidenced by meal completion < 25%, per patient/family report.   GOAL:  Patient will meet greater than or equal to 90% of their needs   MONITOR:  Labs, Weight trends, PO intake, Supplement acceptance  REASON FOR ASSESSMENT:   Malnutrition Screening Tool    ASSESSMENT: Patient is an 88 yo female- lives with her sister. Presents with headache, dizziness, nonproductive cough. COVID-positive.   PMH: non- Hodgkin lymphoma, HTN, anemia, depression and anxiety.   Patient lunch is here and < 25% consumed. Ensure Enlive on tray table - patient doesn't like. Will trial Magic cups given her poor appetite the past 2 days. Independent at baseline per patient. Patient reports 3 meals daily as usual eating pattern when well.   Patient weight history 69-72 kg. Risk for malnutrition and acute weight loss related to poor appetite and infection.  Medications: solumedrol, molnupiravir.   IV-NS@ 75 ml/hr     Latest Ref Rng & Units 08/06/2021    4:35 AM 08/05/2021    3:48 PM 07/22/2021   11:49 AM  BMP  Glucose 70 - 99 mg/dL 73  86  95   BUN 8 - 23 mg/dL '25  26  19   '$ Creatinine 0.44 - 1.00 mg/dL 1.08  1.31  1.04   Sodium 135 - 145 mmol/L 135  133  136   Potassium 3.5 - 5.1 mmol/L 3.8  3.7  3.9   Chloride 98 - 111 mmol/L 107  101  111   CO2 22 - 32 mmol/L '22  24  23   '$ Calcium 8.9 - 10.3 mg/dL 8.3  8.8  8.7       NUTRITION - FOCUSED PHYSICAL EXAM: Nutrition-Focused physical exam findings are mild buccal fat depletion, mild clavicle muscle depletion, and no edema.     Diet Order:   Diet Order             Diet regular Room service appropriate? Yes; Fluid consistency: Thin  Diet effective now                   EDUCATION  NEEDS:  Education needs have been addressed  Skin:  Skin Assessment: Reviewed RN Assessment  Last BM:  6/15 type 7 large  Height:   Ht Readings from Last 1 Encounters:  08/05/21 '5\' 3"'$  (1.6 m)    Weight:   Wt Readings from Last 1 Encounters:  08/05/21 69.1 kg    Ideal Body Weight:   52 kg  BMI:  Body mass index is 26.99 kg/m.  Estimated Nutritional Needs:   Kcal:  1500-1700  Protein:  75-85 gr  Fluid:  1600 ml daily   Colman Cater MS,RD,CSG,LDN Contact:

## 2021-08-06 NOTE — Assessment & Plan Note (Signed)
Followed by [facility] hematology clinica Continue ASA 81 mg daily Previously on hydrea 500 mg MWF>>stopped on 07/29/21 visit Has refused BM biopsy

## 2021-08-06 NOTE — Assessment & Plan Note (Signed)
Followed by [facility] hematology clinic Pt has refused BM biopsy, prefers observation

## 2021-08-07 DIAGNOSIS — D473 Essential (hemorrhagic) thrombocythemia: Secondary | ICD-10-CM

## 2021-08-07 LAB — C-REACTIVE PROTEIN: CRP: 1.9 mg/dL — ABNORMAL HIGH (ref <1.0)

## 2021-08-07 LAB — COMPREHENSIVE METABOLIC PANEL
ALT: 12 U/L (ref 0–44)
AST: 18 U/L (ref 15–41)
Albumin: 2.6 g/dL — ABNORMAL LOW (ref 3.5–5.0)
Alkaline Phosphatase: 43 U/L (ref 38–126)
Anion gap: 5 (ref 5–15)
BUN: 22 mg/dL (ref 8–23)
CO2: 21 mmol/L — ABNORMAL LOW (ref 22–32)
Calcium: 8.2 mg/dL — ABNORMAL LOW (ref 8.9–10.3)
Chloride: 110 mmol/L (ref 98–111)
Creatinine, Ser: 1.04 mg/dL — ABNORMAL HIGH (ref 0.44–1.00)
GFR, Estimated: 52 mL/min — ABNORMAL LOW (ref >60)
Glucose, Bld: 128 mg/dL — ABNORMAL HIGH (ref 70–99)
Potassium: 4.2 mmol/L (ref 3.5–5.1)
Sodium: 136 mmol/L (ref 135–145)
Total Bilirubin: 0.2 mg/dL — ABNORMAL LOW (ref 0.3–1.2)
Total Protein: 6.9 g/dL (ref 6.5–8.1)

## 2021-08-07 LAB — PHOSPHORUS: Phosphorus: 3.6 mg/dL (ref 2.5–4.6)

## 2021-08-07 LAB — CBC WITH DIFFERENTIAL/PLATELET
Abs Immature Granulocytes: 0.03 10*3/uL (ref 0.00–0.07)
Basophils Absolute: 0 10*3/uL (ref 0.0–0.1)
Basophils Relative: 0 %
Eosinophils Absolute: 0 10*3/uL (ref 0.0–0.5)
Eosinophils Relative: 0 %
HCT: 28.5 % — ABNORMAL LOW (ref 36.0–46.0)
Hemoglobin: 9.1 g/dL — ABNORMAL LOW (ref 12.0–15.0)
Immature Granulocytes: 1 %
Lymphocytes Relative: 29 %
Lymphs Abs: 1.6 10*3/uL (ref 0.7–4.0)
MCH: 34.9 pg — ABNORMAL HIGH (ref 26.0–34.0)
MCHC: 31.9 g/dL (ref 30.0–36.0)
MCV: 109.2 fL — ABNORMAL HIGH (ref 80.0–100.0)
Monocytes Absolute: 0.4 10*3/uL (ref 0.1–1.0)
Monocytes Relative: 8 %
Neutro Abs: 3.5 10*3/uL (ref 1.7–7.7)
Neutrophils Relative %: 62 %
Platelets: 250 10*3/uL (ref 150–400)
RBC: 2.61 MIL/uL — ABNORMAL LOW (ref 3.87–5.11)
RDW: 18 % — ABNORMAL HIGH (ref 11.5–15.5)
WBC: 5.6 10*3/uL (ref 4.0–10.5)
nRBC: 0.9 % — ABNORMAL HIGH (ref 0.0–0.2)

## 2021-08-07 LAB — MAGNESIUM: Magnesium: 1.9 mg/dL (ref 1.7–2.4)

## 2021-08-07 LAB — D-DIMER, QUANTITATIVE: D-Dimer, Quant: 0.91 ug/mL-FEU — ABNORMAL HIGH (ref 0.00–0.50)

## 2021-08-07 LAB — FERRITIN: Ferritin: 126 ng/mL (ref 11–307)

## 2021-08-07 MED ORDER — MOLNUPIRAVIR EUA 200MG CAPSULE: 4.0000 | ORAL_CAPSULE | Freq: Two times a day (BID) | ORAL | 0 refills | Status: DC

## 2021-08-07 MED ORDER — PREDNISONE 20 MG PO TABS: 50.0000 mg | ORAL_TABLET | Freq: Every day | ORAL | Status: DC

## 2021-08-07 MED ORDER — AMLODIPINE BESYLATE 5 MG PO TABS: 5.0000 mg | ORAL_TABLET | Freq: Every day | ORAL | 2 refills | Status: DC

## 2021-08-07 MED ORDER — PREDNISONE 50 MG PO TABS: 50.0000 mg | ORAL_TABLET | Freq: Every day | ORAL | 0 refills | Status: DC

## 2021-08-07 NOTE — Care Management Important Message (Signed)
Important Message  Patient Details  Name: Leslie Williamson MRN: [id number] Date of Birth: 11-Mar-1931   Medicare Important Message Given:  Yes - Important Message mailed due to current National Emergency     Tommy Medal 08/07/2021, 10:30 AM

## 2021-08-07 NOTE — Evaluation (Signed)
Physical Therapy Evaluation Patient Details Name: Leslie Williamson MRN: [id number] DOB: 1931-09-14 Today's Date: 08/07/2021  History of Present Illness  Leslie Williamson is a 88 y.o. female with medical history significant for hypertension, non-Hodgkin's lymphoma, peripheral vascular disease, depression and anxiety.  Patient was brought to the ED with reports of headache, chills, funny feeling when urinating, with urinary incontinence and dry cough that all started last night.  She denies difficulty breathing.  No vomiting no loose stools.  No neck pain.   Presented at an urgent care, she was tachycardic to 116, and it had a fever of 100.6, she was referred to the ED for higher level of care due to concern for sepsis of urinary respiratory status. UA done at urgent care was not suggestive of infection.   Clinical Impression  Patient functioning at baseline for functional mobility and gait demonstrating good return for bed mobility, transferring to chair, commode in bathroom and ambulation in room without use of AD with no loss of balance.  Patient encouraged to ambulate ad lib in room as tolerated for length of stay.  Plan:  Patient discharged from physical therapy to care of nursing for ambulation daily as tolerated for length of stay.         Recommendations for follow up therapy are one component of a multi-disciplinary discharge planning process, led by the attending physician.  Recommendations may be updated based on patient status, additional functional criteria and insurance authorization.  Follow Up Recommendations No PT follow up    Assistance Recommended at Discharge PRN  Patient can return home with the following  Help with stairs or ramp for entrance    Equipment Recommendations None recommended by PT  Recommendations for Other Services       Functional Status Assessment Patient has not had a recent decline in their functional status     Precautions / Restrictions  Precautions Precautions: None Restrictions Weight Bearing Restrictions: No      Mobility  Bed Mobility Overal bed mobility: Independent                  Transfers Overall transfer level: Modified independent                      Ambulation/Gait Ambulation/Gait assistance: Modified independent (Device/Increase time) Gait Distance (Feet): 100 Feet Assistive device: None Gait Pattern/deviations: WFL(Within Functional Limits) Gait velocity: near normal     General Gait Details: grossly WFL demonstrating good return for ambulation in room without loss of balance with occasional leaning on nearby objects for support  Stairs            Wheelchair Mobility    Modified Rankin (Stroke Patients Only)       Balance Overall balance assessment: No apparent balance deficits (not formally assessed)                                           Pertinent Vitals/Pain Pain Assessment Pain Assessment: No/denies pain    Home Living Family/patient expects to be discharged to:: Private residence Living Arrangements: Children;Other relatives Available Help at Discharge: Family;Available 24 hours/day Type of Home: House Home Access: Stairs to enter Entrance Stairs-Rails: Right;Left;Can reach both Entrance Stairs-Number of Steps: 2   Home Layout: One level Home Equipment: Conservation officer, nature (2 wheels);Cane - single point;Shower seat      Prior Function Prior  Level of Function : Independent/Modified Independent             Mobility Comments: Community ambulator without AD, drives, shops ADLs Comments: Independent     Hand Dominance        Extremity/Trunk Assessment   Upper Extremity Assessment Upper Extremity Assessment: Overall WFL for tasks assessed    Lower Extremity Assessment Lower Extremity Assessment: Overall WFL for tasks assessed    Cervical / Trunk Assessment Cervical / Trunk Assessment: Normal  Communication    Communication: No difficulties  Cognition Arousal/Alertness: Awake/alert Behavior During Therapy: WFL for tasks assessed/performed Overall Cognitive Status: Within Functional Limits for tasks assessed                                          General Comments      Exercises     Assessment/Plan    PT Assessment Patient does not need any further PT services  PT Problem List         PT Treatment Interventions      PT Goals (Current goals can be found in the Care Plan section)  Acute Rehab PT Goals Patient Stated Goal: return home with family to assist PT Goal Formulation: With patient Time For Goal Achievement: 08/07/21 Potential to Achieve Goals: Good    Frequency       Co-evaluation               AM-PAC PT "6 Clicks" Mobility  Outcome Measure Help needed turning from your back to your side while in a flat bed without using bedrails?: None Help needed moving from lying on your back to sitting on the side of a flat bed without using bedrails?: None Help needed moving to and from a bed to a chair (including a wheelchair)?: None Help needed standing up from a chair using your arms (e.g., wheelchair or bedside chair)?: None Help needed to walk in hospital room?: None Help needed climbing 3-5 steps with a railing? : A Little 6 Click Score: 23    End of Session   Activity Tolerance: Patient tolerated treatment well Patient left: in chair;with call bell/phone within reach Nurse Communication: Mobility status PT Visit Diagnosis: Unsteadiness on feet (R26.81);Other abnormalities of gait and mobility (R26.89);Muscle weakness (generalized) (M62.81)    Time: 2458-0998 PT Time Calculation (min) (ACUTE ONLY): 25 min   Charges:   PT Evaluation $PT Eval Moderate Complexity: 1 Mod PT Treatments $Therapeutic Activity: 23-37 mins        10:40 AM, 08/07/21 Lonell Grandchild, MPT Physical Therapist with [facility] [phone]  office [phone] mobile phone

## 2021-08-07 NOTE — Progress Notes (Signed)
Patient declines HHPT. Ambulated 100 feet during evaluation.    Leslie Williamson, Clydene Pugh, LCSW

## 2021-08-07 NOTE — Discharge Summary (Signed)
Physician Discharge Summary   Patient: Leslie Williamson MRN: [id number] DOB: Jul 05, 1931  Admit date:     08/05/2021  Discharge date: 08/07/21  Discharge Physician: Shanon Brow Mekia Dipinto   PCP: Carrolyn Meiers, MD    Please follow up with primary care provider within 1-2 weeks  Please repeat BMP and CBC in one week    Hospital Course: 88 year old female with a history of non-Hodgkin's lymphoma (Diagnosed in 1999, status post CHOP therapy x7 cycles followed by rituximab for 1 cycle) s/p splenectomy, hypertension, peripheral vascular disease, anxiety presenting with dizziness, headache, and nonproductive cough that began on the evening of 08/04/2021.  The patient also has some subjective fevers and chills.  She denies any chest pain, shortness of breath, hemoptysis, visual disturbance.  There is no nausea, vomiting, diarrhea, abdominal pain, dysuria.  The patient went to urgent care on 08/05/2021 where she was noted to have a temperature of 100.6 F with tachycardia.  She was sent to the emergency department for further evaluation and treatment.  She also endorses poor p.o. intake for the last 24 hours. In the ED, the patient had a low-grade temperature of 100.6 F with tachycardia and tachypnea.  She was hemodynamically stable.  Oxygen saturation was 87% on room air.  BMP showed a sodium 133, potassium 3.7, serum creatinine 1.31, bicarbonate 24.  LFTs were unremarkable.  WBC 7.9, hemoglobin 10.0, platelets 284,000.  UA was negative for pyuria.  D-dimer was elevated at 1.56.  CTA chest was obtained and was negative for PE.  Showed trace pericardial effusion, bibasilar atelectasis without any infiltrates or edema.  Her COVID-19 PCR was positive.  The patient was admitted for further evaluation and treatment.  Assessment and Plan: * Acute respiratory failure with hypoxia (HCC) O2 sats down to 87% on room air with tachypnea,  -placed on 2 L. >>weaned to RA -6/24 CTA chest neg for PE, neg  consolidation -ambulatory pulse ox on day of d/c did not show desaturation <90%  MGUS (monoclonal gammopathy of unknown significance) Followed by [facility] hematology clinic Pt has refused BM biopsy, prefers observation  Essential thrombocytosis ([place]) Followed by [facility] hematology clinica Continue ASA 81 mg daily Previously on hydrea 500 mg MWF>>stopped on 07/29/21 visit Has refused BM biopsy  Acute renal failure superimposed on stage 3a chronic kidney disease ([place]) -close to baseline-  0.9 - 1.0.  -presented with serum creatinine 1.31 - Hydrate -Contrast exposure 6/14 -Hold HCTZ, losartan>>will not restart -serum creatinine 1.04 at time of d/c  COVID-19 virus infection -start solumedrol IV>>d/c home with prednisone 50 mg daily x 5 more days -Cannot get Paxlovid due to renal insufficiency, chat with pharmacy.Molunupiravir started. - Albuterol inhaler PRN and scheduled, mucolytics -1 L bolus given, continue N/s 75cc/hr x 20hrs - PCT 0.15 - CRP 2.6>>1.9 -ferritin 106>>126 -D-dimer 1.56>>0.91   Essential hypertension Blood pressure soft initially -Hold Norvasc, HCTZ, losartan. -Hydrated -restart amlodipine>>increase to 5 mg daily Will not restart HCTZ or losartan at time of d/c Follow up with PCP         Consultants: none Procedures performed: none  Disposition: Home Diet recommendation:  Cardiac diet DISCHARGE MEDICATION: Allergies as of 08/07/2021       Reactions   Penicillins Rash        Medication List     STOP taking these medications    diclofenac 50 MG tablet Commonly known as: CATAFLAM   hydrochlorothiazide 12.5 MG tablet Commonly known as: HYDRODIURIL   losartan 100 MG tablet Commonly known as:  COZAAR   meclizine 25 MG tablet Commonly known as: ANTIVERT   potassium chloride SA 20 MEQ tablet Commonly known as: KLOR-CON M       TAKE these medications    amLODipine 5 MG tablet Commonly known as: NORVASC Take 1 tablet (5 mg total) by mouth  daily. What changed:  medication strength how much to take   aspirin 81 MG tablet Take 81 mg by mouth daily.   atorvastatin 40 MG tablet Commonly known as: LIPITOR Take 40 mg by mouth daily.   ferrous sulfate 325 (65 FE) MG EC tablet Take 1 tablet (325 mg total) by mouth daily with breakfast.   ibuprofen 800 MG tablet Commonly known as: ADVIL Take 800 mg by mouth every 8 (eight) hours as needed for moderate pain.   molnupiravir EUA 200 mg Caps capsule Commonly known as: LAGEVRIO Take 4 capsules (800 mg total) by mouth 2 (two) times daily. X 6 more doses   predniSONE 50 MG tablet Commonly known as: DELTASONE Take 1 tablet (50 mg total) by mouth daily with breakfast. Start taking on: August 08, 2021   temazepam 15 MG capsule Commonly known as: RESTORIL Take 1 capsule (15 mg total) by mouth at bedtime as needed for sleep.        Discharge Exam: Filed Weights   08/05/21 1433  Weight: 69.1 kg   HEENT:  [place]/AT, No thrush, no icterus CV:  RRR, no rub, no S3, no S4 Lung:  fine bibasilar crackles. No wheeze Abd:  soft/+BS, NT Ext:  trace LE edema, no lymphangitis, no synovitis, no rash   Condition at discharge: stable  The results of significant diagnostics from this hospitalization (including imaging, microbiology, ancillary and laboratory) are listed below for reference.   Imaging Studies: CT Angio Chest PE W and/or Wo Contrast  Result Date: 08/05/2021 CLINICAL DATA:  Pulmonary embolism (PE) suspected, high prob. Fever, tachycardia, hypoxia EXAM: CT ANGIOGRAPHY CHEST WITH CONTRAST TECHNIQUE: Multidetector CT imaging of the chest was performed using the standard protocol during bolus administration of intravenous contrast. Multiplanar CT image reconstructions and MIPs were obtained to evaluate the vascular anatomy. RADIATION DOSE REDUCTION: This exam was performed according to the departmental dose-optimization program which includes automated exposure control, adjustment  of the mA and/or kV according to patient size and/or use of iterative reconstruction technique. CONTRAST:  46m OMNIPAQUE IOHEXOL 350 MG/ML SOLN COMPARISON:  None Available. FINDINGS: Cardiovascular: Extensive multi-vessel coronary artery calcification. Global cardiac size within normal limits. Trace pericardial effusion. The central pulmonary arteries are of normal caliber. No intraluminal filling defect identified to suggest acute pulmonary embolism. Mild atherosclerotic calcification within the thoracic aorta. No aortic aneurysm. Mediastinum/Nodes: No enlarged mediastinal, hilar, or axillary lymph nodes. Thyroid gland, trachea, and esophagus demonstrate no significant findings. Small hiatal hernia. Lungs/Pleura: Mild bibasilar atelectasis. Lungs are otherwise clear. No pneumothorax or pleural effusion. No central obstructing mass. Upper Abdomen: Status post cholecystectomy.  No acute abnormality. Musculoskeletal: No acute bone abnormality. No lytic or blastic bone lesion. Review of the MIP images confirms the above findings. IMPRESSION: 1. No pulmonary embolism. 2. Extensive multi-vessel coronary artery calcification. Aortic Atherosclerosis (ICD10-I70.0). Electronically Signed   By: AFidela SalisburyM.D.   On: 08/05/2021 20:15   DG Chest 2 View  Result Date: 08/05/2021 CLINICAL DATA:  Cough and fever. EXAM: CHEST - 2 VIEW COMPARISON:  Chest x-ray 03/08/2016. FINDINGS: Lung volumes are low. The heart is mildly enlarged, unchanged. The mediastinal silhouette is within normal limits. Both lungs are clear.  The visualized skeletal structures are unremarkable. IMPRESSION: No active cardiopulmonary disease. Stable cardiomegaly. Electronically Signed   By: Ronney Asters M.D.   On: 08/05/2021 16:27   DG Bone Survey Met  Result Date: 07/30/2021 CLINICAL DATA:  MGUS EXAM: METASTATIC BONE SURVEY COMPARISON:  Bone survey 06/12/2020. FINDINGS: No focal osseous lesions are identified.  No acute fracture. Degenerative  changes of the lumbar spine with grade 1 anterolisthesis at L3-L4 appears unchanged. There are surgical clips in the abdomen and right neck. There are atherosclerotic calcifications of the aorta. Peripheral vascular calcifications are also present. IMPRESSION: 1. No osseous lesions are identified. Electronically Signed   By: Ronney Asters M.D.   On: 07/30/2021 15:36    Microbiology: Results for orders placed or performed during the hospital encounter of 08/05/21  Resp Panel by RT-PCR (Flu A&B, Covid) Anterior Nasal Swab     Status: Abnormal   Collection Time: 08/05/21  3:17 PM   Specimen: Anterior Nasal Swab  Result Value Ref Range Status   SARS Coronavirus 2 by RT PCR POSITIVE (A) NEGATIVE Final    Comment: (NOTE) SARS-CoV-2 target nucleic acids are DETECTED.  The SARS-CoV-2 RNA is generally detectable in upper respiratory specimens during the acute phase of infection. Positive results are indicative of the presence of the identified virus, but do not rule out bacterial infection or co-infection with other pathogens not detected by the test. Clinical correlation with patient history and other diagnostic information is necessary to determine patient infection status. The expected result is Negative.  Fact Sheet for Patients: EntrepreneurPulse.com.au  Fact Sheet for Healthcare Providers: IncredibleEmployment.be  This test is not yet approved or cleared by the [place] FDA and  has been authorized for detection and/or diagnosis of SARS-CoV-2 by FDA under an Emergency Use Authorization (EUA).  This EUA will remain in effect (meaning this test can be used) for the duration of  the COVID-19 declaration under Section 564(b)(1) of the A ct, 21 U.S.C. section 360bbb-3(b)(1), unless the authorization is terminated or revoked sooner.     Influenza A by PCR NEGATIVE NEGATIVE Final   Influenza B by PCR NEGATIVE NEGATIVE Final    Comment: (NOTE) The  Xpert Xpress SARS-CoV-2/FLU/RSV plus assay is intended as an aid in the diagnosis of influenza from Nasopharyngeal swab specimens and should not be used as a sole basis for treatment. Nasal washings and aspirates are unacceptable for Xpert Xpress SARS-CoV-2/FLU/RSV testing.  Fact Sheet for Patients: EntrepreneurPulse.com.au  Fact Sheet for Healthcare Providers: IncredibleEmployment.be  This test is not yet approved or cleared by the [place] FDA and has been authorized for detection and/or diagnosis of SARS-CoV-2 by FDA under an Emergency Use Authorization (EUA). This EUA will remain in effect (meaning this test can be used) for the duration of the COVID-19 declaration under Section 564(b)(1) of the Act, 21 U.S.C. section 360bbb-3(b)(1), unless the authorization is terminated or revoked.  Performed at [facility], [address] [place], [place] [address]   Blood Culture (routine x 2)     Status: None (Preliminary result)   Collection Time: 08/05/21  3:20 PM   Specimen: Right Antecubital; Blood  Result Value Ref Range Status   Specimen Description RIGHT ANTECUBITAL  Final   Special Requests   Final    BOTTLES DRAWN AEROBIC AND ANAEROBIC Blood Culture results may not be optimal due to an inadequate volume of blood received in culture bottles   Culture   Final    NO GROWTH 2 DAYS Performed at  [facility], [address] [place], [place] [address]    Report Status PENDING  Incomplete  Blood Culture (routine x 2)     Status: None (Preliminary result)   Collection Time: 08/05/21  3:49 PM   Specimen: BLOOD LEFT ARM  Result Value Ref Range Status   Specimen Description BLOOD LEFT ARM  Final   Special Requests   Final    BOTTLES DRAWN AEROBIC AND ANAEROBIC Blood Culture adequate volume   Culture   Final    NO GROWTH 2 DAYS Performed at [facility], [address], [place], [place] [address]    Report Status PENDING  Incomplete  Urine  Culture     Status: None   Collection Time: 08/05/21  7:00 PM   Specimen: In/Out Cath Urine  Result Value Ref Range Status   Specimen Description   Final    IN/OUT CATH URINE Performed at [facility], [address] [place], [place] [address]    Special Requests   Final    NONE Performed at [facility] [address], [place], [place] [address]    Culture   Final    NO GROWTH Performed at [facility], [place] [address], [place], [place] [address]    Report Status 08/06/2021 FINAL  Final    Labs: CBC: Recent Labs  Lab 08/05/21 1548 08/06/21 0435 08/07/21 0319  WBC 7.9 7.3 5.6  NEUTROABS 4.6 3.7 3.5  HGB 10.0* 9.3* 9.1*  HCT 30.8* 28.6* 28.5*  MCV 108.1* 109.6* 109.2*  PLT 284 248 681   Basic Metabolic Panel: Recent Labs  Lab 08/05/21 1548 08/06/21 0435 08/07/21 0319  NA 133* 135 136  K 3.7 3.8 4.2  CL 101 107 110  CO2 24 22 21*  GLUCOSE 86 73 128*  BUN 26* 25* 22  CREATININE 1.31* 1.08* 1.04*  CALCIUM 8.8* 8.3* 8.2*  MG  --  2.0 1.9  PHOS  --  3.6 3.6   Liver Function Tests: Recent Labs  Lab 08/05/21 1548 08/06/21 0435 08/07/21 0319  AST 16 19 18   ALT 11 12 12   ALKPHOS 55 48 43  BILITOT 0.5 0.4 0.2*  PROT 8.3* 7.2 6.9  ALBUMIN 3.3* 2.8* 2.6*   CBG: Recent Labs  Lab 08/05/21 2326  GLUCAP 89    Discharge time spent: greater than 30 minutes.  Signed: Orson Eva, MD [facility] 08/07/2021

## 2021-08-07 NOTE — Progress Notes (Signed)
Patient discharged home with medications, and instructions,. Discharged via w/c tlo son personal vehicle, IV removed from LFA.

## 2021-08-10 LAB — CULTURE, BLOOD (ROUTINE X 2)
Culture: NO GROWTH
Culture: NO GROWTH
Special Requests: ADEQUATE

## 2021-08-11 LAB — GLUCOSE, CAPILLARY: Glucose-Capillary: 78 mg/dL (ref 70–99)

## 2021-08-18 DIAGNOSIS — U099 Post covid-19 condition, unspecified: Secondary | ICD-10-CM | POA: Diagnosis not present

## 2021-08-18 DIAGNOSIS — N179 Acute kidney failure, unspecified: Secondary | ICD-10-CM | POA: Diagnosis not present

## 2021-08-18 DIAGNOSIS — I10 Essential (primary) hypertension: Secondary | ICD-10-CM | POA: Diagnosis not present

## 2021-08-18 DIAGNOSIS — J9621 Acute and chronic respiratory failure with hypoxia: Secondary | ICD-10-CM | POA: Diagnosis not present

## 2021-08-27 DIAGNOSIS — M831 Senile osteomalacia: Secondary | ICD-10-CM | POA: Diagnosis not present

## 2021-08-27 DIAGNOSIS — I10 Essential (primary) hypertension: Secondary | ICD-10-CM | POA: Diagnosis not present

## 2021-08-27 DIAGNOSIS — N390 Urinary tract infection, site not specified: Secondary | ICD-10-CM | POA: Diagnosis not present

## 2021-08-27 DIAGNOSIS — D509 Iron deficiency anemia, unspecified: Secondary | ICD-10-CM | POA: Diagnosis not present

## 2021-09-03 DIAGNOSIS — E8809 Other disorders of plasma-protein metabolism, not elsewhere classified: Secondary | ICD-10-CM | POA: Diagnosis not present

## 2021-09-03 DIAGNOSIS — D471 Chronic myeloproliferative disease: Secondary | ICD-10-CM | POA: Diagnosis not present

## 2021-09-03 DIAGNOSIS — R2240 Localized swelling, mass and lump, unspecified lower limb: Secondary | ICD-10-CM | POA: Diagnosis not present

## 2021-09-03 DIAGNOSIS — D473 Essential (hemorrhagic) thrombocythemia: Secondary | ICD-10-CM | POA: Diagnosis not present

## 2021-09-29 ENCOUNTER — Other Ambulatory Visit: Payer: Self-pay

## 2021-09-30 ENCOUNTER — Inpatient Hospital Stay: Payer: Medicare Other | Attending: Physician Assistant

## 2021-09-30 DIAGNOSIS — D509 Iron deficiency anemia, unspecified: Secondary | ICD-10-CM | POA: Insufficient documentation

## 2021-09-30 DIAGNOSIS — I129 Hypertensive chronic kidney disease with stage 1 through stage 4 chronic kidney disease, or unspecified chronic kidney disease: Secondary | ICD-10-CM | POA: Diagnosis not present

## 2021-09-30 DIAGNOSIS — D539 Nutritional anemia, unspecified: Secondary | ICD-10-CM

## 2021-09-30 DIAGNOSIS — D473 Essential (hemorrhagic) thrombocythemia: Secondary | ICD-10-CM | POA: Insufficient documentation

## 2021-09-30 DIAGNOSIS — G629 Polyneuropathy, unspecified: Secondary | ICD-10-CM | POA: Diagnosis not present

## 2021-09-30 DIAGNOSIS — D75839 Thrombocytosis, unspecified: Secondary | ICD-10-CM | POA: Insufficient documentation

## 2021-09-30 DIAGNOSIS — D472 Monoclonal gammopathy: Secondary | ICD-10-CM | POA: Insufficient documentation

## 2021-09-30 DIAGNOSIS — Z8579 Personal history of other malignant neoplasms of lymphoid, hematopoietic and related tissues: Secondary | ICD-10-CM | POA: Diagnosis not present

## 2021-09-30 DIAGNOSIS — F419 Anxiety disorder, unspecified: Secondary | ICD-10-CM | POA: Insufficient documentation

## 2021-09-30 DIAGNOSIS — D631 Anemia in chronic kidney disease: Secondary | ICD-10-CM | POA: Diagnosis not present

## 2021-09-30 DIAGNOSIS — N1832 Chronic kidney disease, stage 3b: Secondary | ICD-10-CM | POA: Insufficient documentation

## 2021-09-30 LAB — COMPREHENSIVE METABOLIC PANEL
ALT: 11 U/L (ref 0–44)
AST: 14 U/L — ABNORMAL LOW (ref 15–41)
Albumin: 3.1 g/dL — ABNORMAL LOW (ref 3.5–5.0)
Alkaline Phosphatase: 62 U/L (ref 38–126)
Anion gap: 9 (ref 5–15)
BUN: 9 mg/dL (ref 8–23)
CO2: 28 mmol/L (ref 22–32)
Calcium: 8.9 mg/dL (ref 8.9–10.3)
Chloride: 103 mmol/L (ref 98–111)
Creatinine, Ser: 0.83 mg/dL (ref 0.44–1.00)
GFR, Estimated: >60 mL/min (ref >60)
Glucose, Bld: 90 mg/dL (ref 70–99)
Potassium: 2.9 mmol/L — ABNORMAL LOW (ref 3.5–5.1)
Sodium: 140 mmol/L (ref 135–145)
Total Bilirubin: 0.5 mg/dL (ref 0.3–1.2)
Total Protein: 8.4 g/dL — ABNORMAL HIGH (ref 6.5–8.1)

## 2021-09-30 LAB — CBC WITH DIFFERENTIAL/PLATELET
Abs Immature Granulocytes: 0.02 10*3/uL (ref 0.00–0.07)
Basophils Absolute: 0 10*3/uL (ref 0.0–0.1)
Basophils Relative: 0 %
Eosinophils Absolute: 0 10*3/uL (ref 0.0–0.5)
Eosinophils Relative: 0 %
HCT: 28.9 % — ABNORMAL LOW (ref 36.0–46.0)
Hemoglobin: 9.3 g/dL — ABNORMAL LOW (ref 12.0–15.0)
Immature Granulocytes: 0 %
Lymphocytes Relative: 38 %
Lymphs Abs: 2.9 10*3/uL (ref 0.7–4.0)
MCH: 33.2 pg (ref 26.0–34.0)
MCHC: 32.2 g/dL (ref 30.0–36.0)
MCV: 103.2 fL — ABNORMAL HIGH (ref 80.0–100.0)
Monocytes Absolute: 1 10*3/uL (ref 0.1–1.0)
Monocytes Relative: 13 %
Neutro Abs: 3.6 10*3/uL (ref 1.7–7.7)
Neutrophils Relative %: 49 %
Platelets: 346 10*3/uL (ref 150–400)
RBC: 2.8 MIL/uL — ABNORMAL LOW (ref 3.87–5.11)
RDW: 18.8 % — ABNORMAL HIGH (ref 11.5–15.5)
WBC: 7.6 10*3/uL (ref 4.0–10.5)
nRBC: 0.5 % — ABNORMAL HIGH (ref 0.0–0.2)

## 2021-09-30 LAB — FERRITIN: Ferritin: 45 ng/mL (ref 11–307)

## 2021-09-30 LAB — IRON AND TIBC
Iron: 55 ug/dL (ref 28–170)
Saturation Ratios: 19 % (ref 10.4–31.8)
TIBC: 291 ug/dL (ref 250–450)
UIBC: 236 ug/dL

## 2021-09-30 LAB — FOLATE: Folate: 14 ng/mL (ref >5.9)

## 2021-09-30 LAB — LACTATE DEHYDROGENASE: LDH: 154 U/L (ref 98–192)

## 2021-09-30 LAB — VITAMIN B12: Vitamin B-12: 312 pg/mL (ref 180–914)

## 2021-10-01 LAB — HOMOCYSTEINE: Homocysteine: 9.3 umol/L (ref 0.0–21.3)

## 2021-10-02 LAB — PROTEIN ELECTROPHORESIS, SERUM
A/G Ratio: 0.7 (ref 0.7–1.7)
Albumin ELP: 3.2 g/dL (ref 2.9–4.4)
Alpha-1-Globulin: 0.3 g/dL (ref 0.0–0.4)
Alpha-2-Globulin: 0.6 g/dL (ref 0.4–1.0)
Beta Globulin: 0.9 g/dL (ref 0.7–1.3)
Gamma Globulin: 2.8 g/dL — ABNORMAL HIGH (ref 0.4–1.8)
Globulin, Total: 4.6 g/dL — ABNORMAL HIGH (ref 2.2–3.9)
M-Spike, %: 2.5 g/dL — ABNORMAL HIGH
Total Protein ELP: 7.8 g/dL (ref 6.0–8.5)

## 2021-10-04 DIAGNOSIS — I10 Essential (primary) hypertension: Secondary | ICD-10-CM | POA: Diagnosis not present

## 2021-10-04 DIAGNOSIS — N184 Chronic kidney disease, stage 4 (severe): Secondary | ICD-10-CM | POA: Diagnosis not present

## 2021-10-05 LAB — METHYLMALONIC ACID, SERUM: Methylmalonic Acid, Quantitative: 166 nmol/L (ref 0–378)

## 2021-10-06 NOTE — Progress Notes (Unsigned)
[place], [place] [address]   CLINIC:  Medical Oncology/Hematology  PCP:  Carrolyn Meiers, MD [place] [id number] [phone]   REASON FOR VISIT:  Follow-up for anemia, JAK2 positive essential thrombocytosis, MGUS vs. smoldering myeloma   CURRENT THERAPY: Hydrea, oral iron, intermittent IV iron infusions  INTERVAL HISTORY:  Leslie Williamson 88 y.o. female returns for routine follow-up of her JAK2 positive essential thrombocytosis, iron deficiency anemia, and MGUS.  She was last seen by [name] PA-C on 07/29/2021.  At today's visit, she reports feeling well.  Since her last visit, she was hospitalized for COVID-19 and hypoxic respiratory failure in June 2023.  After her last visit in June 2023, hydroxyurea was discontinued.    She denies any chest pain on exertion, presyncopal episodes, palpitations. She denies dyspnea on exertion.  She has not had any recent epistaxis, hematemesis, hematochezia, or melena.  She denies erythromelalgia, aquagenic pruritus, vasomotor symptoms.  No new bone pains.  No dizziness, new onset hearing loss, blurry vision, or new neurologic deficits.  She does have some chronic intermittent tingling in her hands and feet, but states that she is not diabetic; this has been no worse than usual.  She denies any B symptoms such as fever, chills, night sweats, unintentional weight loss. She has not noticed any new lumps, bumps, or lymphadenopathy.  She has 60% energy and 60% appetite. She endorses that she is maintaining a stable weight.   REVIEW OF SYSTEMS:    Review of Systems  Constitutional:  Positive for fatigue. Negative for appetite change, chills, diaphoresis, fever and unexpected weight change.  HENT:   Negative for lump/mass and nosebleeds.   Eyes:  Negative for eye problems.  Respiratory:  Negative for cough, hemoptysis and shortness of breath.   Cardiovascular:  Positive  for leg swelling. Negative for chest pain and palpitations.  Gastrointestinal:  Negative for abdominal pain, blood in stool, constipation, diarrhea, nausea and vomiting.  Genitourinary:  Negative for hematuria.   Skin: Negative.   Neurological:  Negative for dizziness, headaches and light-headedness.  Hematological:  Does not bruise/bleed easily.     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   Vertigo, labyrinthine    Past Surgical History:  Procedure Laterality Date   CATARACT EXTRACTION W/PHACO Left 11/03/2015   Procedure: CATARACT EXTRACTION PHACO AND INTRAOCULAR LENS PLACEMENT ([facility]);  Surgeon: Tonny Branch, MD;  Location: [facility];  Service: Ophthalmology;  Laterality: Left;  CDE: 7.74   CATARACT EXTRACTION W/PHACO Right 11/20/2015   Procedure: CATARACT EXTRACTION PHACO AND INTRAOCULAR LENS PLACEMENT ; CDE:  8.30;  Surgeon: Tonny Branch, MD;  Location: [facility];  Service: Ophthalmology;  Laterality: Right;   PORT-A-CATH REMOVAL      SOCIAL HISTORY:  Social History   Socioeconomic History   Marital status: Widowed    Spouse name:  Not on file   Number of children: Not on file   Years of education: Not on file   Highest education level: Not on file  Occupational History   Not on file  Tobacco Use   Smoking status: Former   Smokeless tobacco: Never  Vaping Use   Vaping Use: Never used  Substance and Sexual Activity   Alcohol use: No   Drug use: No   Sexual  activity: Not Currently  Other Topics Concern   Not on file  Social History Narrative   Not on file   Social Determinants of Health   Financial Resource Strain: Low Risk  (12/31/2019)   Overall Financial Resource Strain (CARDIA)    Difficulty of Paying Living Expenses: Not hard at all  Food Insecurity: No Food Insecurity (12/31/2019)   Hunger Vital Sign    Worried About Running Out of Food in the Last Year: Never true    [place]iber[place] in the Last Year: Never true  Transportation Needs: No Transportation Needs (12/31/2019)   PRAPARE - Hydrologist (Medical): No    Lack of Transportation (Non-Medical): No  Physical Activity: Inactive (12/31/2019)   Exercise Vital Sign    Days of Exercise per Week: 0 days    Minutes of Exercise per Session: 0 min  Stress: No Stress Concern Present (12/31/2019)   [place]    Feeling of Stress : Not at all  Social Connections: Moderately Isolated (12/31/2019)   Social Connection and Isolation Panel [NHANES]    Frequency of Communication with Friends and Family: More than three times a week    Frequency of Social Gatherings with Friends and Family: More than three times a week    Attends Religious Services: More than 4 times per year    Active Member of [employer] or Organizations: No    Attends Archivist Meetings: Never    Marital Status: Widowed  Intimate Partner Violence: Not At Risk (12/31/2019)   Humiliation, Afraid, Rape, and Kick questionnaire    Fear of Current or Ex-Partner: No    Emotionally Abused: No    Physically Abused: No    Sexually Abused: No    FAMILY HISTORY:  Family History  Problem Relation Age of Onset   Diabetes Mother     CURRENT MEDICATIONS:  Outpatient Encounter Medications as of 10/07/2021  Medication Sig   amLODipine (NORVASC) 5 MG tablet Take 1 tablet (5 mg total) by mouth daily.   aspirin 81 MG tablet Take 81 mg  by mouth daily.   atorvastatin (LIPITOR) 40 MG tablet Take 40 mg by mouth daily.   ferrous sulfate 325 (65 FE) MG EC tablet Take 1 tablet (325 mg total) by mouth daily with breakfast.   ibuprofen (ADVIL,MOTRIN) 800 MG tablet Take 800 mg by mouth every 8 (eight) hours as needed for moderate pain.   molnupiravir EUA (LAGEVRIO) 200 mg CAPS capsule Take 4 capsules (800 mg total) by mouth 2 (two) times daily. X 6 more doses   predniSONE (DELTASONE) 50 MG tablet Take 1 tablet (50 mg total) by mouth daily with breakfast.   temazepam (RESTORIL) 15 MG capsule Take 1 capsule (15 mg total) by mouth at bedtime as needed for sleep.   No facility-administered encounter medications on file as of 10/07/2021.    ALLERGIES:  Allergies  Allergen Reactions   Penicillins Rash     PHYSICAL EXAM:  ECOG PERFORMANCE STATUS:  1 - Symptomatic but completely ambulatory    There were no vitals filed for this visit. There were no vitals filed for this visit. Physical Exam Constitutional:      Appearance: Normal appearance. She is obese.  HENT:     Head: Normocephalic and atraumatic.     Mouth/Throat:     Mouth: Mucous membranes are moist.  Eyes:     Extraocular Movements: Extraocular movements intact.     Pupils: Pupils are equal, round, and reactive to light.  Cardiovascular:     Rate and Rhythm: Normal rate and regular rhythm.     Pulses: Normal pulses.     Heart sounds: Normal heart sounds.  Pulmonary:     Effort: Pulmonary effort is normal.     Breath sounds: Normal breath sounds.  Abdominal:     General: Bowel sounds are normal.     Palpations: Abdomen is soft.     Tenderness: There is no abdominal tenderness.  Musculoskeletal:        General: No swelling.     Right lower leg: Edema (trace) present.     Left lower leg: Edema (trace) present.  Lymphadenopathy:     Head:     Right side of head: No submental, submandibular, tonsillar, preauricular, posterior auricular or occipital adenopathy.      Left side of head: No submental, submandibular, tonsillar, preauricular, posterior auricular or occipital adenopathy.     Cervical: No cervical adenopathy.     Right cervical: No superficial, deep or posterior cervical adenopathy.    Left cervical: No superficial, deep or posterior cervical adenopathy.     Upper Body:     Right upper body: No supraclavicular, axillary, pectoral or epitrochlear adenopathy.     Left upper body: No supraclavicular, axillary, pectoral or epitrochlear adenopathy.     Lower Body: No right inguinal adenopathy. No left inguinal adenopathy.  Skin:    General: Skin is warm and dry.  Neurological:     General: No focal deficit present.     Mental Status: She is alert and oriented to person, place, and time.  Psychiatric:        Mood and Affect: Mood normal.        Behavior: Behavior normal.    LABORATORY DATA:  I have reviewed the labs as listed.  CBC    Component Value Date/Time   WBC 7.6 09/30/2021 1141   RBC 2.80 (L) 09/30/2021 1141   HGB 9.3 (L) 09/30/2021 1141   HCT 28.9 (L) 09/30/2021 1141   PLT 346 09/30/2021 1141   MCV 103.2 (H) 09/30/2021 1141   MCH 33.2 09/30/2021 1141   MCHC 32.2 09/30/2021 1141   RDW 18.8 (H) 09/30/2021 1141   LYMPHSABS 2.9 09/30/2021 1141   MONOABS 1.0 09/30/2021 1141   EOSABS 0.0 09/30/2021 1141   BASOSABS 0.0 09/30/2021 1141      Latest Ref Rng & Units 09/30/2021   11:41 AM 08/07/2021    3:19 AM 08/06/2021    4:35 AM  CMP  Glucose 70 - 99 mg/dL 90  128  73   BUN 8 - 23 mg/dL 9  22  25    Creatinine 0.44 - 1.00 mg/dL 0.83  1.04  1.08   Sodium 135 - 145 mmol/L 140  136  135   Potassium 3.5 - 5.1 mmol/L 2.9  4.2  3.8   Chloride 98 - 111 mmol/L 103  110  107   CO2 22 - 32 mmol/L 28  21  22   Calcium 8.9 - 10.3 mg/dL 8.9  8.2  8.3   Total Protein 6.5 - 8.1 g/dL 8.4  6.9  7.2   Total Bilirubin 0.3 - 1.2 mg/dL 0.5  0.2  0.4   Alkaline Phos 38 - 126 U/L 62  43  48   AST 15 - 41 U/L 14  18  19    ALT 0 - 44 U/L 11  12  12       DIAGNOSTIC IMAGING:  I have independently reviewed the relevant imaging and discussed with the patient.  ASSESSMENT & PLAN:   1.  JAK2 positive essential thrombocytosis - Major manifestation is thrombocytosis.  She also has previous history of splenectomy. - Takes 81 mg aspirin daily - Hydroxyurea temporarily discontinued in June 2023   - No aquagenic pruritus, erythromelalgia, or vasomotor symptoms.   - No lymphadenopathy or splenomegaly on exam  - Despite discontinuation of Hydrea in June 2023, labs from 09/30/2021 show Hgb 9.3/MCV 103.2, normal platelets 346, and normal WBC 7.6 with normal differential.  LDH normal. - PLAN: Continue to HOLD off on Hydrea. - Decreased need for Hydrea may indicate progression towards post-ET myelofibrosis. Worsening anemia may also be indicative of developing multiple myeloma.  Patient has agreed to proceed with BONE MARROW BIOPSY (see below). - RTC after bone marrow biopsy to discuss results   2.  Iron deficiency anemia and anemia of CKD: - Patient has longstanding iron deficiency anemia, has been seen at this clinic for over 10 years - Intermittent Venofer infusions, last given on 12/18/2020 and 12/25/2020 - Patient failed to improve on oral iron therapy - Patient had 1/3 stool occult blood cards positive, was referred to gastroenterology who did not feel any EGD/colonoscopy was necessary at this time. - Denies any gross rectal hemorrhage or melena.         - Patient has underlying CKD stage IIIa/IIIb which is likely also contributing to anemia.  She does not see a nephrologist. - Multifactorial anemia secondary to CKD, intermittent iron deficiency; may also be some suppressive effect from Hydrea. - Symptomatic with fatigue and mild dyspnea on exertion   - Iron panel (09/30/2021): Ferritin 45, iron saturation 19% - CBC (09/30/2021): Hgb 9.3/MCV 103.2 - Nutritional panel (09/30/2021): Normal vitamin B12, folate, homocystine, and methylmalonic acid  - PLAN:  Recommend IV Venofer 300 mg x 3. - Would consider initiation of Retacrit if she continues to have Hgb <10.0 and absence of any other cause. - Worsening anemia may also be indicative of developing multiple myeloma or progression to myelofibrosis.   Patient has agreed to proceed with BONE MARROW BIOPSY (see below). - RTC after bone marrow biopsy to discuss results.   3.  MGUS, IgG lambda and IgM kappa - Myeloma panel checked on 06/05/2020 to investigate other possible causes of anemia revealed underlying MGUS:  SPEP showed M spike 1.7, IFE showed monoclonal protein of both IgG lambda and IgM kappa; kappa free light chain only mildly elevated at 32.0 with normal lambda free light chains 23.4; normal free light chain ratio 1.37; LDH elevated at 198, beta-2 microglobulin elevated at 2.8 - 24-hour urine IFE showed IgG kappa monoclonal protein, M spike not observed - Most recent skeletal survey (07/29/2021): No lytic lesions or evidence of skeletal multiple myeloma - No new bone pains, or B symptoms.   She does have some intermittent peripheral neuropathy.      - Most recent MGUS/myeloma panel (07/22/2021): M spike trending upwards at 2.3; mildly elevated  kappa (27.4) with normal lambda (16.9) and normal free light chain ratio (1.62). - Repeat SPEP (09/30/2021) shows M spike continuing to increase at 2.5 (trending upward steadily from M spike 1.7 in April 2022) - 09/30/2021: She has worsening anemia with Hgb 9.3.  Creatinine 0.83, calcium 8.9. - Previously offered bone marrow biopsy (August 2022), which patient declined at that time and opted for watchful waiting instead. - PLAN: Discussed with patient that her increasing M spike and progressive anemia is concerning for multiple myeloma.  She has agreed to proceed with bone marrow biopsy.  Patient's son has been informed as well. - Patient to be scheduled for CT-guided bone marrow biopsy at [facility]. - RTC 1 week after bone marrow biopsy to discuss results.    4.  History of marginal zone lymphoma of the spleen: - Diagnosed in 1999, status post CHOP therapy x7 cycles followed by rituximab for 1 cycle. - Posttreatment CT scan showed complete response. - Status post splenectomy - No B symptoms at this time.  No palpable lymphadenopathy on exam.         - PLAN: Continue monitoring with follow-up visits   5.  Anxiety: -Patient is taking BuSpar 5 mg twice daily - PLAN: Continue BuSpar as needed.   PLAN SUMMARY & DISPOSITION:   Venofer 300 mg x 3 CT-guided bone marrow biopsy at [facility] visit 1 week after bone marrow biopsy to discuss results and next steps  All questions were answered. The patient knows to call the clinic with any problems, questions or concerns.  Medical decision making: Moderate    Time spent on visit: I spent 20 minutes counseling the patient face to face. The total time spent in the appointment was 30 minutes and more than 50% was on counseling.   Harriett Rush, PA-C  10/07/2021 1:59 PM

## 2021-10-07 ENCOUNTER — Inpatient Hospital Stay: Payer: Medicare Other | Admitting: Physician Assistant

## 2021-10-07 ENCOUNTER — Telehealth: Payer: Self-pay | Admitting: Physician Assistant

## 2021-10-07 VITALS — BP 146/78 | HR 89 | Temp 98.7°F | Resp 18 | Ht 63.0 in | Wt 157.7 lb

## 2021-10-07 DIAGNOSIS — D473 Essential (hemorrhagic) thrombocythemia: Secondary | ICD-10-CM | POA: Diagnosis not present

## 2021-10-07 DIAGNOSIS — D631 Anemia in chronic kidney disease: Secondary | ICD-10-CM | POA: Diagnosis not present

## 2021-10-07 DIAGNOSIS — D509 Iron deficiency anemia, unspecified: Secondary | ICD-10-CM | POA: Diagnosis not present

## 2021-10-07 DIAGNOSIS — Z8579 Personal history of other malignant neoplasms of lymphoid, hematopoietic and related tissues: Secondary | ICD-10-CM | POA: Diagnosis not present

## 2021-10-07 DIAGNOSIS — D472 Monoclonal gammopathy: Secondary | ICD-10-CM

## 2021-10-07 DIAGNOSIS — D75839 Thrombocytosis, unspecified: Secondary | ICD-10-CM | POA: Diagnosis not present

## 2021-10-07 DIAGNOSIS — G629 Polyneuropathy, unspecified: Secondary | ICD-10-CM | POA: Diagnosis not present

## 2021-10-07 DIAGNOSIS — I129 Hypertensive chronic kidney disease with stage 1 through stage 4 chronic kidney disease, or unspecified chronic kidney disease: Secondary | ICD-10-CM | POA: Diagnosis not present

## 2021-10-07 DIAGNOSIS — N1832 Chronic kidney disease, stage 3b: Secondary | ICD-10-CM | POA: Diagnosis not present

## 2021-10-07 DIAGNOSIS — F419 Anxiety disorder, unspecified: Secondary | ICD-10-CM | POA: Diagnosis not present

## 2021-10-07 NOTE — Patient Instructions (Signed)
[facility] **   You were seen today by [name] PA-C for your anemia and your other blood conditions.    ANEMIA: Your blood and iron levels are lower than normal. We will schedule you for IV iron x3 doses.  JAK2 ESSENTIAL THROMBOCYTOSIS: Your platelet levels are normal. You do NOT need to restart your Hydrea at this time.  POSSIBLE MULTIPLE MYELOMA Your most recent labs show increased abnormal protein, which may indicate a type of cancer in your bones called "multiple myeloma." We will schedule you for a BONE MARROW BIOPSY to see if you have any cancer in your bones. We will discuss these results and come up with a treatment plan at your follow-up visit in 1 month.  ** Thank you for trusting me with your healthcare!  I strive to provide all of my patients with quality care at each visit.  If you receive a survey for this visit, I would be so grateful to you for taking the time to provide feedback.  Thank you in advance!  ~ Shawnetta Lein                   Dr. Derek Jack   &   [name], PA-C   - - - - - - - - - - - - - - - - - -    Thank you for choosing [facility] to provide your oncology and hematology care.  To afford each patient quality time with our provider, please arrive at least 15 minutes before your scheduled appointment time.   If you have a lab appointment with the [facility] please come in thru the Main Entrance and check in at the main information desk.  You need to re-schedule your appointment should you arrive 10 or more minutes late.  We strive to give you quality time with our providers, and arriving late affects you and other patients whose appointments are after yours.  Also, if you no show three or more times for appointments you may be dismissed from the clinic at the providers discretion.     Again, thank you for choosing  [facility].  Our hope is that these requests will decrease the amount of time that you wait before being seen by our physicians.       _____________________________________________________________  Should you have questions after your visit to [facility], please contact our office at [phone] and follow the prompts.  Our office hours are 8:00 a.m. and 4:30 p.m. Monday - Friday.  Please note that voicemails left after 4:00 p.m. may not be returned until the following business day.  We are closed weekends and major holidays.  You do have access to a nurse 24-7, just call the main number to the clinic [phone] and do not press any options, hold on the line and a nurse will answer the phone.    For prescription refill requests, have your pharmacy contact our office and allow 72 hours.

## 2021-10-07 NOTE — Telephone Encounter (Signed)
With the patient's permission, I called and discussed the details of her care with her son Leslie Williamson).  I informed Leslie Williamson that his mother has worsening anemia and increased abnormal protein (M spike trending upward) that is concerning for progression to multiple myeloma.  I informed him that bone marrow biopsy was the next most important step in her diagnosis to determine her official diagnosis and decide on a treatment plan.  I informed him that patient agreed to bone marrow biopsy during my visit with her today.  Patient's son requests that he be notified once her bone marrow biopsy has been scheduled so that he can help to coordinate this appointment with her.  All questions were answered to his satisfaction during this phone call.  [name] PA-C 10/07/2021 1:46 PM

## 2021-10-08 ENCOUNTER — Other Ambulatory Visit: Payer: Self-pay | Admitting: Physician Assistant

## 2021-10-08 DIAGNOSIS — D472 Monoclonal gammopathy: Secondary | ICD-10-CM

## 2021-10-16 ENCOUNTER — Telehealth: Payer: Self-pay | Admitting: Physician Assistant

## 2021-10-16 NOTE — Telephone Encounter (Signed)
Scheduling team for bone marrow biopsy at [facility] has been unable to reach patient. I called and spoke with [facility] via telephone on Friday, 10/16/2021. She is still agreeable to bone marrow biopsy and PET scan. She tried to call scheduling back for bone marrow biopsy, but says that she got the busy signal. She gave permission for schedulers to call her son Yaretzy Olazabal) so that he can coordinate her bone marrow biopsy and PET scan appointments.  [name] PA-C 10/16/2021 2:45 PM

## 2021-10-23 ENCOUNTER — Inpatient Hospital Stay: Payer: Medicare Other | Attending: Hematology

## 2021-10-23 VITALS — BP 155/71 | HR 86 | Temp 97.0°F | Resp 18

## 2021-10-23 DIAGNOSIS — D509 Iron deficiency anemia, unspecified: Secondary | ICD-10-CM | POA: Diagnosis not present

## 2021-10-23 DIAGNOSIS — G629 Polyneuropathy, unspecified: Secondary | ICD-10-CM | POA: Diagnosis not present

## 2021-10-23 DIAGNOSIS — C9000 Multiple myeloma not having achieved remission: Secondary | ICD-10-CM | POA: Diagnosis not present

## 2021-10-23 DIAGNOSIS — I739 Peripheral vascular disease, unspecified: Secondary | ICD-10-CM | POA: Diagnosis not present

## 2021-10-23 DIAGNOSIS — E7800 Pure hypercholesterolemia, unspecified: Secondary | ICD-10-CM | POA: Insufficient documentation

## 2021-10-23 DIAGNOSIS — D631 Anemia in chronic kidney disease: Secondary | ICD-10-CM | POA: Insufficient documentation

## 2021-10-23 DIAGNOSIS — Z7902 Long term (current) use of antithrombotics/antiplatelets: Secondary | ICD-10-CM | POA: Insufficient documentation

## 2021-10-23 DIAGNOSIS — D473 Essential (hemorrhagic) thrombocythemia: Secondary | ICD-10-CM | POA: Insufficient documentation

## 2021-10-23 DIAGNOSIS — I129 Hypertensive chronic kidney disease with stage 1 through stage 4 chronic kidney disease, or unspecified chronic kidney disease: Secondary | ICD-10-CM | POA: Diagnosis not present

## 2021-10-23 DIAGNOSIS — Z79899 Other long term (current) drug therapy: Secondary | ICD-10-CM | POA: Insufficient documentation

## 2021-10-23 DIAGNOSIS — I10 Essential (primary) hypertension: Secondary | ICD-10-CM | POA: Insufficient documentation

## 2021-10-23 DIAGNOSIS — N1831 Chronic kidney disease, stage 3a: Secondary | ICD-10-CM | POA: Diagnosis not present

## 2021-10-23 DIAGNOSIS — Z7952 Long term (current) use of systemic steroids: Secondary | ICD-10-CM | POA: Diagnosis not present

## 2021-10-23 MED ORDER — LORATADINE 10 MG PO TABS
10.0000 mg | ORAL_TABLET | Freq: Once | ORAL | Status: AC
  Administered 2021-10-23: 10 mg via ORAL
  Filled 2021-10-23: qty 1

## 2021-10-23 MED ORDER — SODIUM CHLORIDE 0.9 % IV SOLN
300.0000 mg | Freq: Once | INTRAVENOUS | Status: AC
  Administered 2021-10-23: 300 mg via INTRAVENOUS
  Filled 2021-10-23: qty 300

## 2021-10-23 MED ORDER — ACETAMINOPHEN 325 MG PO TABS
650.0000 mg | ORAL_TABLET | Freq: Once | ORAL | Status: AC
  Administered 2021-10-23: 650 mg via ORAL
  Filled 2021-10-23: qty 2

## 2021-10-23 MED ORDER — SODIUM CHLORIDE 0.9 % IV SOLN: Freq: Once | INTRAVENOUS | Status: AC

## 2021-10-23 NOTE — Patient Instructions (Signed)
[facility]  Discharge Instructions: Thank you for choosing [facility] to provide your oncology and hematology care.  If you have a lab appointment with the Cancer Center, please come in thru the Main Entrance and check in at the main information desk.  Wear comfortable clothing and clothing appropriate for easy access to any Portacath or PICC line.   We strive to give you quality time with your provider. You may need to reschedule your appointment if you arrive late (15 or more minutes).  Arriving late affects you and other patients whose appointments are after yours.  Also, if you miss three or more appointments without notifying the office, you may be dismissed from the clinic at the provider's discretion.      For prescription refill requests, have your pharmacy contact our office and allow 72 hours for refills to be completed.     To help prevent nausea and vomiting after your treatment, we encourage you to take your nausea medication as directed.  BELOW ARE SYMPTOMS THAT SHOULD BE REPORTED IMMEDIATELY: *FEVER GREATER THAN 100.4 F (38 C) OR HIGHER *CHILLS OR SWEATING *NAUSEA AND VOMITING THAT IS NOT CONTROLLED WITH YOUR NAUSEA MEDICATION *UNUSUAL SHORTNESS OF BREATH *UNUSUAL BRUISING OR BLEEDING *URINARY PROBLEMS (pain or burning when urinating, or frequent urination) *BOWEL PROBLEMS (unusual diarrhea, constipation, pain near the anus) TENDERNESS IN MOUTH AND THROAT WITH OR WITHOUT PRESENCE OF ULCERS (sore throat, sores in mouth, or a toothache) UNUSUAL RASH, SWELLING OR PAIN  UNUSUAL VAGINAL DISCHARGE OR ITCHING   Items with * indicate a potential emergency and should be followed up as soon as possible or go to the Emergency Department if any problems should occur.  Please show the CHEMOTHERAPY ALERT CARD or IMMUNOTHERAPY ALERT CARD at check-in to the Emergency Department and triage nurse.  Should you have questions after your visit or need to  cancel or reschedule your appointment, please contact [facility] [phone]  and follow the prompts.  Office hours are 8:00 a.m. to 4:30 p.m. Monday - Friday. Please note that voicemails left after 4:00 p.m. may not be returned until the following business day.  We are closed weekends and major holidays. You have access to a nurse at all times for urgent questions. Please call the main number to the clinic [phone] and follow the prompts.  For any non-urgent questions, you may also contact your provider using MyChart. We now offer e-Visits for anyone 18 and older to request care online for non-urgent symptoms. For details visit [email or web address].   Also download the MyChart app! Go to the app store, search "MyChart", open the app, select [facility], and log in with your MyChart username and password.  Masks are optional in the cancer centers. If you would like for your care team to wear a mask while they are taking care of you, please let them know. You may have one support person who is at least 86 years old accompany you for your appointments.  

## 2021-10-23 NOTE — Progress Notes (Signed)
Patient presents today for iron infusion.  Patient is in satisfactory condition with no complaints voiced.  Vital signs are stable.  We will proceed with infusion per provider orders.   1200:  Patient called out with pain at IV site.  IV site was swollen and painful to touch.  Iron was stopped.  IV was removed and ice applied to affected area.    We were unable to get a new IV site.  Patient will reschedule her appointment and was advised to hydrate very well prior to next appointment.  Patient left ambulatory in stable condition.

## 2021-10-29 ENCOUNTER — Encounter (HOSPITAL_COMMUNITY)
Admission: RE | Admit: 2021-10-29 | Discharge: 2021-10-29 | Disposition: A | Discharge status: Home / Self Care | Payer: Medicare Other | Source: Ambulatory Visit | Attending: Physician Assistant | Admitting: Physician Assistant

## 2021-10-29 ENCOUNTER — Encounter (HOSPITAL_COMMUNITY): Payer: Self-pay

## 2021-10-29 ENCOUNTER — Other Ambulatory Visit: Payer: Self-pay | Admitting: Physician Assistant

## 2021-10-29 DIAGNOSIS — D539 Nutritional anemia, unspecified: Secondary | ICD-10-CM

## 2021-10-29 DIAGNOSIS — D509 Iron deficiency anemia, unspecified: Secondary | ICD-10-CM

## 2021-10-29 DIAGNOSIS — D472 Monoclonal gammopathy: Secondary | ICD-10-CM | POA: Insufficient documentation

## 2021-10-29 NOTE — Progress Notes (Signed)
Referral for port placement - poor peripheral access, requires frequent labs and iron infusions.

## 2021-10-30 ENCOUNTER — Inpatient Hospital Stay: Payer: Medicare Other

## 2021-10-30 ENCOUNTER — Other Ambulatory Visit: Payer: Self-pay | Admitting: Internal Medicine

## 2021-10-30 DIAGNOSIS — D472 Monoclonal gammopathy: Secondary | ICD-10-CM

## 2021-11-01 ENCOUNTER — Other Ambulatory Visit: Payer: Self-pay | Admitting: Student

## 2021-11-02 ENCOUNTER — Ambulatory Visit (HOSPITAL_COMMUNITY)
Admission: RE | Admit: 2021-11-02 | Discharge: 2021-11-02 | Disposition: A | Discharge status: Home / Self Care | Payer: Medicare Other | Source: Ambulatory Visit | Attending: Physician Assistant | Admitting: Physician Assistant

## 2021-11-02 ENCOUNTER — Encounter (HOSPITAL_COMMUNITY): Payer: Self-pay | Admitting: *Deleted

## 2021-11-02 DIAGNOSIS — Z1379 Encounter for other screening for genetic and chromosomal anomalies: Secondary | ICD-10-CM | POA: Insufficient documentation

## 2021-11-02 DIAGNOSIS — D472 Monoclonal gammopathy: Secondary | ICD-10-CM | POA: Diagnosis not present

## 2021-11-02 DIAGNOSIS — C9000 Multiple myeloma not having achieved remission: Secondary | ICD-10-CM | POA: Diagnosis not present

## 2021-11-02 LAB — CBC WITH DIFFERENTIAL/PLATELET
Abs Immature Granulocytes: 0.02 10*3/uL (ref 0.00–0.07)
Basophils Absolute: 0 10*3/uL (ref 0.0–0.1)
Basophils Relative: 0 %
Eosinophils Absolute: 0 10*3/uL (ref 0.0–0.5)
Eosinophils Relative: 0 %
HCT: 32.5 % — ABNORMAL LOW (ref 36.0–46.0)
Hemoglobin: 10.2 g/dL — ABNORMAL LOW (ref 12.0–15.0)
Immature Granulocytes: 0 %
Lymphocytes Relative: 48 %
Lymphs Abs: 3.3 10*3/uL (ref 0.7–4.0)
MCH: 32 pg (ref 26.0–34.0)
MCHC: 31.4 g/dL (ref 30.0–36.0)
MCV: 101.9 fL — ABNORMAL HIGH (ref 80.0–100.0)
Monocytes Absolute: 0.9 10*3/uL (ref 0.1–1.0)
Monocytes Relative: 12 %
Neutro Abs: 2.8 10*3/uL (ref 1.7–7.7)
Neutrophils Relative %: 40 %
Platelets: 307 10*3/uL (ref 150–400)
RBC: 3.19 MIL/uL — ABNORMAL LOW (ref 3.87–5.11)
RDW: 20.2 % — ABNORMAL HIGH (ref 11.5–15.5)
WBC: 7.1 10*3/uL (ref 4.0–10.5)
nRBC: 1 % — ABNORMAL HIGH (ref 0.0–0.2)

## 2021-11-02 MED ORDER — FLUMAZENIL 0.5 MG/5ML IV SOLN
INTRAVENOUS | Status: AC
  Filled 2021-11-02: qty 5

## 2021-11-02 MED ORDER — FENTANYL CITRATE (PF) 100 MCG/2ML IJ SOLN
INTRAMUSCULAR | Status: AC | PRN
  Administered 2021-11-02: 25 ug via INTRAVENOUS

## 2021-11-02 MED ORDER — FENTANYL CITRATE (PF) 100 MCG/2ML IJ SOLN
INTRAMUSCULAR | Status: AC
  Filled 2021-11-02: qty 2

## 2021-11-02 MED ORDER — NALOXONE HCL 0.4 MG/ML IJ SOLN
INTRAMUSCULAR | Status: AC
  Filled 2021-11-02: qty 1

## 2021-11-02 MED ORDER — MIDAZOLAM HCL 2 MG/2ML IJ SOLN
INTRAMUSCULAR | Status: AC
  Filled 2021-11-02: qty 2

## 2021-11-02 MED ORDER — MIDAZOLAM HCL 2 MG/2ML IJ SOLN
INTRAMUSCULAR | Status: AC | PRN
  Administered 2021-11-02: .5 mg via INTRAVENOUS

## 2021-11-02 MED ORDER — SODIUM CHLORIDE 0.9 % IV SOLN: INTRAVENOUS | Status: DC

## 2021-11-02 MED ORDER — LIDOCAINE HCL 1 % IJ SOLN
INTRAMUSCULAR | Status: AC | PRN
  Administered 2021-11-02: 10 mL via INTRADERMAL

## 2021-11-02 NOTE — Procedures (Signed)
Interventional Radiology Procedure Note  Procedure: CT guided aspirate and core biopsy of right iliac bone Complications: None Recommendations: - Bedrest supine x 1 hrs - Hydrocodone PRN  Pain - Follow biopsy results  Signed,  Kitai Purdom K. Jamarkis Branam, MD   

## 2021-11-02 NOTE — Discharge Instructions (Signed)

## 2021-11-02 NOTE — Consult Note (Signed)
Chief Complaint: Patient was seen in consultation today for  CT guided bone marrow biopsy  Referring Physician(s): [place] M  Supervising Physician: Jacqulynn Cadet  Patient Status: [facility] - Out-pt  History of Present Illness: Leslie Williamson is an 88 y.o. 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  She is scheduled today for CT-guided bone marrow biopsy for further evaluation.  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)    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   Vertigo, labyrinthine     Past Surgical History:  Procedure Laterality Date   CATARACT EXTRACTION W/PHACO Left 11/03/2015   Procedure: CATARACT EXTRACTION PHACO AND INTRAOCULAR LENS PLACEMENT ([place]);  Surgeon: Tonny Branch, MD;  Location: [facility];  Service: Ophthalmology;  Laterality: Left;  CDE: 7.74   CATARACT EXTRACTION W/PHACO Right 11/20/2015   Procedure: CATARACT EXTRACTION PHACO AND INTRAOCULAR LENS PLACEMENT ; CDE:  8.30;  Surgeon: Tonny Branch, MD;  Location: [facility];  Service: Ophthalmology;  Laterality: Right;   PORT-A-CATH REMOVAL      Allergies: Penicillins  Medications: Prior to Admission medications   Medication Sig Start Date End Date Taking? Authorizing Provider  amLODipine (NORVASC) 5 MG tablet Take 1 tablet (5 mg total) by mouth daily. 08/07/21  Yes Tat, Shanon Brow,  MD  aspirin 81 MG tablet Take 81 mg by mouth daily.    [provider]  atorvastatin (LIPITOR) 40 MG tablet Take 40 mg by mouth daily. 01/07/15   [provider]  cilostazol (PLETAL) 100 MG tablet Take 100 mg by mouth 2 (two) times daily. 09/03/21   [provider]  ferrous sulfate 325 (65 FE) MG EC tablet Take 1 tablet (325 mg total) by mouth daily with breakfast. 04/08/20   Jacquelin Hawking, NP  ibuprofen (ADVIL,MOTRIN) 800 MG tablet Take 800 mg by mouth every 8 (eight) hours as needed for moderate pain. 03/10/15   [provider]  predniSONE (DELTASONE) 50 MG tablet Take 1 tablet (50 mg total) by mouth daily with breakfast. 08/08/21   Tat, Shanon Brow, MD  temazepam (RESTORIL) 15 MG capsule Take 1 capsule (15 mg total) by mouth at bedtime as needed for sleep. 09/18/18   Glennie Isle, NP-C     Family History  Problem Relation Age of Onset   Diabetes Mother     Social History   Socioeconomic History   Marital status: Widowed    Spouse name: Not on file   Number of children: Not on file   Years of education: Not on file   Highest education level: Not on file  Occupational History   Not on file  Tobacco Use   Smoking status: Former   Smokeless tobacco: Never  Vaping Use   Vaping Use: Never used  Substance and Sexual Activity   Alcohol use: No   Drug use: No  Sexual activity: Not Currently  Other Topics Concern   Not on file  Social History Narrative   Not on file   Social Determinants of Health   Financial Resource Strain: Low Risk  (12/31/2019)   Overall Financial Resource Strain (CARDIA)    Difficulty of Paying Living Expenses: Not hard at all  Food Insecurity: No Food Insecurity (12/31/2019)   Hunger Vital Sign    Worried About Running Out of Food in the Last Year: Never true    Ran Out of Food in the Last Year: Never true  Transportation Needs: No Transportation Needs (12/31/2019)   PRAPARE - Hydrologist  (Medical): No    Lack of Transportation (Non-Medical): No  Physical Activity: Inactive (12/31/2019)   Exercise Vital Sign    Days of Exercise per Week: 0 days    Minutes of Exercise per Session: 0 min  Stress: No Stress Concern Present (12/31/2019)   [place]    Feeling of Stress : Not at all  Social Connections: Moderately Isolated (12/31/2019)   Social Connection and Isolation Panel [NHANES]    Frequency of Communication with Friends and Family: More than three times a week    Frequency of Social Gatherings with Friends and Family: More than three times a week    Attends Religious Services: More than 4 times per year    Active Member of [employer] or Organizations: No    Attends Archivist Meetings: Never    Marital Status: Widowed    ECOG Status:   Review of Systems currently denies fever, headache, chest pain, dyspnea, cough, abdominal/back pain, nausea, vomiting or bleeding.  Vital Signs: BP (!) 154/75   Pulse 81   Temp 98.1 F (36.7 C) (Oral)   Resp 16   Ht 5' (1.524 m)   Wt 163 lb (73.9 kg)   SpO2 98%   BMI 31.83 kg/m      Physical Exam awake, alert.  Chest clear to auscultation bilaterally.  Heart with regular rate and rhythm.  Abdomen obese, soft, positive bowel sounds, nontender. Trace pretibial edema bilaterally.  Imaging: No results found.  Labs:  CBC: Recent Labs    08/05/21 1548 08/06/21 0435 08/07/21 0319 09/30/21 1141  WBC 7.9 7.3 5.6 7.6  HGB 10.0* 9.3* 9.1* 9.3*  HCT 30.8* 28.6* 28.5* 28.9*  PLT 284 248 250 346    COAGS: Recent Labs    08/05/21 1548  INR 1.1  APTT 26    BMP: Recent Labs    08/05/21 1548 08/06/21 0435 08/07/21 0319 09/30/21 1141  NA 133* 135 136 140  K 3.7 3.8 4.2 2.9*  CL 101 107 110 103  CO2 24 22 21* 28  GLUCOSE 86 73 128* 90  BUN 26* 25* 22 9  CALCIUM 8.8* 8.3* 8.2* 8.9  CREATININE 1.31* 1.08* 1.04* 0.83  GFRNONAA 39* 49* 52*  >60    LIVER FUNCTION TESTS: Recent Labs    08/05/21 1548 08/06/21 0435 08/07/21 0319 09/30/21 1141  BILITOT 0.5 0.4 0.2* 0.5  AST 16 19 18  14*  ALT 11 12 12 11   ALKPHOS 55 48 43 62  PROT 8.3* 7.2 6.9 8.4*  ALBUMIN 3.3* 2.8* 2.6* 3.1*    TUMOR MARKERS: No results for input(s): "AFPTM", "CEA", "CA199", "CHROMGRNA" in the last 8760 hours.  Assessment and Plan: 88 y.o. 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  She is scheduled today for CT-guided bone marrow biopsy for further evaluation.Risks and benefits of procedure was discussed with the patient  including, but not limited to bleeding, infection, damage to adjacent structures or low yield requiring additional tests.  All of the questions were answered and there is agreement to proceed.  Consent signed and in chart.    Thank you for this interesting consult.  I greatly enjoyed meeting Leslie Williamson and look forward to participating in their care.  A copy of this report was sent to the requesting provider on this date.  Electronically Signed: D. Rowe Robert, PA-C 11/02/2021, 8:02 AM   I spent a total of 20 minutes    in face to face in clinical consultation, greater than 50% of which was counseling/coordinating care for CT guided bone marrow biopsy

## 2021-11-03 ENCOUNTER — Other Ambulatory Visit: Payer: Self-pay | Admitting: Radiology

## 2021-11-04 ENCOUNTER — Encounter (HOSPITAL_COMMUNITY): Payer: Self-pay

## 2021-11-04 ENCOUNTER — Ambulatory Visit (HOSPITAL_COMMUNITY)
Admission: RE | Admit: 2021-11-04 | Discharge: 2021-11-04 | Disposition: A | Discharge status: Home / Self Care | Payer: Medicare Other | Source: Ambulatory Visit | Attending: Physician Assistant | Admitting: Physician Assistant

## 2021-11-04 ENCOUNTER — Other Ambulatory Visit: Payer: Self-pay

## 2021-11-04 DIAGNOSIS — Z452 Encounter for adjustment and management of vascular access device: Secondary | ICD-10-CM | POA: Diagnosis not present

## 2021-11-04 DIAGNOSIS — D509 Iron deficiency anemia, unspecified: Secondary | ICD-10-CM | POA: Diagnosis not present

## 2021-11-04 DIAGNOSIS — D473 Essential (hemorrhagic) thrombocythemia: Secondary | ICD-10-CM | POA: Diagnosis not present

## 2021-11-04 DIAGNOSIS — D539 Nutritional anemia, unspecified: Secondary | ICD-10-CM | POA: Diagnosis not present

## 2021-11-04 DIAGNOSIS — D472 Monoclonal gammopathy: Secondary | ICD-10-CM | POA: Diagnosis not present

## 2021-11-04 HISTORY — PX: IR IMAGING GUIDED PORT INSERTION: IMG5740

## 2021-11-04 LAB — SURGICAL PATHOLOGY

## 2021-11-04 MED ORDER — MIDAZOLAM HCL 2 MG/2ML IJ SOLN
INTRAMUSCULAR | Status: AC
  Filled 2021-11-04: qty 2

## 2021-11-04 MED ORDER — SODIUM CHLORIDE 0.9 % IV SOLN: INTRAVENOUS | Status: DC

## 2021-11-04 MED ORDER — FENTANYL CITRATE (PF) 100 MCG/2ML IJ SOLN
INTRAMUSCULAR | Status: AC
  Filled 2021-11-04: qty 2

## 2021-11-04 MED ORDER — LIDOCAINE-EPINEPHRINE 1 %-1:100000 IJ SOLN
INTRAMUSCULAR | Status: AC
  Administered 2021-11-04: 20 mL
  Filled 2021-11-04: qty 1

## 2021-11-04 MED ORDER — FENTANYL CITRATE (PF) 100 MCG/2ML IJ SOLN
INTRAMUSCULAR | Status: AC | PRN
  Administered 2021-11-04: 25 ug via INTRAVENOUS

## 2021-11-04 MED ORDER — MIDAZOLAM HCL 2 MG/2ML IJ SOLN
INTRAMUSCULAR | Status: AC | PRN
  Administered 2021-11-04: 1 mg via INTRAVENOUS
  Administered 2021-11-04: .5 mg via INTRAVENOUS

## 2021-11-04 MED ORDER — HEPARIN SOD (PORK) LOCK FLUSH 100 UNIT/ML IV SOLN
INTRAVENOUS | Status: AC
  Filled 2021-11-04: qty 5

## 2021-11-04 NOTE — Progress Notes (Signed)
Patient and son was given discharge instructions. Both verbalized understanding. 

## 2021-11-04 NOTE — H&P (Signed)
Chief Complaint: Patient was seen in consultation today for [facility] a cath placement at the request of [place] M  Referring Physician(s): [place] Physician: Sandi Mariscal  Patient Status: [facility] - Out-pt  History of Present Illness: Leslie Williamson is a 88 y.o. 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 Known Iron deficiency anemia- requiring multiple labs and infusions She has very poor venous access  Request for [facility] a cath placement per Hematology     Past Medical History:  Diagnosis Date   Anxiety    Complication of anesthesia    difficult to wake up   Depression    Hypercholesteremia    Hypertension    Hypokalemia    Iron deficiency anemia    Left hip pain    NHL (non-Hodgkin's lymphoma) ([place]) 12/17/2010   Obesity    Primary thrombocytosis ([place]) 01/11/2006   Secondary to splenectomy     PVD (peripheral vascular disease) ([place])    S/P splenectomy 01/01/2014   Small cell B-cell lymphoma of spleen ([place])    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   Vertigo, labyrinthine     Past Surgical History:  Procedure Laterality Date   CATARACT EXTRACTION W/PHACO Left 11/03/2015   Procedure: CATARACT EXTRACTION PHACO AND INTRAOCULAR LENS PLACEMENT ([place]);  Surgeon: Tonny Branch, MD;  Location: [facility];  Service: Ophthalmology;  Laterality: Left;  CDE: 7.74   CATARACT EXTRACTION W/PHACO Right 11/20/2015   Procedure: CATARACT EXTRACTION PHACO AND INTRAOCULAR LENS PLACEMENT ; CDE:  8.30;  Surgeon: Tonny Branch, MD;  Location: [facility];  Service: Ophthalmology;  Laterality: Right;   PORT-A-CATH REMOVAL      Allergies: Penicillins  Medications: Prior to Admission medications   Medication Sig Start Date End Date Taking? Authorizing Provider  amLODipine (NORVASC) 2.5 MG tablet Take 2.5 mg by mouth daily.   Yes [provider]  aspirin 81 MG tablet Take 81 mg by mouth daily.   Yes [provider]  atorvastatin (LIPITOR) 40 MG tablet Take 40 mg by mouth daily. 01/07/15  Yes [provider]  busPIRone (BUSPAR) 5 MG tablet Take 5 mg by mouth at bedtime as needed (anxiety/sleep).   Yes [provider]  cilostazol (PLETAL) 100 MG tablet Take 100 mg by mouth 2 (two) times daily. 09/03/21  Yes [provider]  amLODipine (NORVASC) 5 MG tablet Take 1 tablet (5 mg total) by mouth daily. Patient not taking: Reported on 11/03/2021 08/07/21   Orson Eva, MD  ferrous sulfate 325 (65 FE) MG EC tablet Take 1 tablet (325 mg total) by mouth daily with breakfast. Patient not taking: Reported on 11/03/2021 04/08/20   Jacquelin Hawking, NP  predniSONE (DELTASONE) 50 MG tablet Take 1 tablet (50 mg total) by mouth daily with breakfast. Patient not taking: Reported on 11/03/2021 08/08/21   Orson Eva, MD     Family History  Problem Relation Age of Onset   Diabetes Mother     Social History   Socioeconomic History   Marital status: Widowed    Spouse name: Not on file   Number of children: Not on file   Years of education: Not on file   Highest education level: Not on file  Occupational History   Not on file  Tobacco Use   Smoking status: Former   Smokeless tobacco: Never  Vaping Use   Vaping Use: Never  used  Substance and Sexual Activity   Alcohol use: No   Drug use: No   Sexual activity: Not Currently  Other Topics Concern   Not on file  Social History Narrative   Not on file   Social Determinants of Health   Financial Resource Strain: Low Risk  (12/31/2019)   Overall Financial Resource Strain (CARDIA)    Difficulty of Paying Living Expenses: Not hard at all  Food Insecurity: No Food Insecurity (12/31/2019)   Hunger Vital Sign    Worried About Running Out of Food in the Last Year: Never true    Ran Out of Food in the Last Year: Never true  Transportation Needs: No  Transportation Needs (12/31/2019)   PRAPARE - Hydrologist (Medical): No    Lack of Transportation (Non-Medical): No  Physical Activity: Inactive (12/31/2019)   Exercise Vital Sign    Days of Exercise per Week: 0 days    Minutes of Exercise per Session: 0 min  Stress: No Stress Concern Present (12/31/2019)   [place]    Feeling of Stress : Not at all  Social Connections: Moderately Isolated (12/31/2019)   Social Connection and Isolation Panel [NHANES]    Frequency of Communication with Friends and Family: More than three times a week    Frequency of Social Gatherings with Friends and Family: More than three times a week    Attends Religious Services: More than 4 times per year    Active Member of [employer] or Organizations: No    Attends Archivist Meetings: Never    Marital Status: Widowed    Review of Systems: A 12 point ROS discussed and pertinent positives are indicated in the HPI above.  All other systems are negative.   Vital Signs: BP (!) 174/81   Pulse 83   Temp 98.3 F (36.8 C)   Resp 16   Ht 5' (1.524 m)   Wt 163 lb (73.9 kg)   SpO2 95%   BMI 31.83 kg/m     Physical Exam Vitals reviewed.  HENT:     Mouth/Throat:     Mouth: Mucous membranes are moist.  Cardiovascular:     Rate and Rhythm: Normal rate. Rhythm irregular.  Pulmonary:     Effort: Pulmonary effort is normal.     Breath sounds: Normal breath sounds. No wheezing.  Abdominal:     Palpations: Abdomen is soft.  Musculoskeletal:        General: Normal range of motion.  Skin:    General: Skin is warm.  Neurological:     Mental Status: She is alert and oriented to person, place, and time.  Psychiatric:        Behavior: Behavior normal.     Imaging: CT BONE MARROW BIOPSY & ASPIRATION  Result Date: 11/02/2021 INDICATION: 88 year old female with monoclonal gammopathy of undetermined significance.  She presents for CT-guided bone marrow biopsy. EXAM: CT GUIDED BONE MARROW ASPIRATION AND CORE BIOPSY Interventional Radiologist:  Criselda Peaches, MD MEDICATIONS: None. ANESTHESIA/SEDATION: Moderate (conscious) sedation was employed during this procedure. A total of 1 milligrams versed and 50 micrograms fentanyl were administered intravenously. The patient's level of consciousness and vital signs were monitored continuously by radiology nursing throughout the procedure under my direct supervision. Total monitored sedation time: 10 minutes FLUOROSCOPY: None. COMPLICATIONS: None immediate. Estimated blood loss: <25 mL PROCEDURE: Informed written consent was obtained from the patient after a thorough discussion of  the procedural risks, benefits and alternatives. All questions were addressed. Maximal Sterile Barrier Technique was utilized including caps, mask, sterile gowns, sterile gloves, sterile drape, hand hygiene and skin antiseptic. A timeout was performed prior to the initiation of the procedure. The patient was positioned prone and non-contrast localization CT was performed of the pelvis to demonstrate the iliac marrow spaces. Maximal barrier sterile technique utilized including caps, mask, sterile gowns, sterile gloves, large sterile drape, hand hygiene, and betadine prep. Under sterile conditions and local anesthesia, an 11 gauge coaxial bone biopsy needle was advanced into the right iliac marrow space. Needle position was confirmed with CT imaging. Initially, bone marrow aspiration was performed. Next, the 11 gauge outer cannula was utilized to obtain a right iliac bone marrow core biopsy. Needle was removed. Hemostasis was obtained with compression. The patient tolerated the procedure well. Samples were prepared with the cytotechnologist. IMPRESSION: Technically successful CT-guided bone marrow aspiration and core biopsy of the right iliac bone. Electronically Signed   By: Jacqulynn Cadet M.D.   On:  11/02/2021 12:36    Labs:  CBC: Recent Labs    08/06/21 0435 08/07/21 0319 09/30/21 1141 11/02/21 0846  WBC 7.3 5.6 7.6 7.1  HGB 9.3* 9.1* 9.3* 10.2*  HCT 28.6* 28.5* 28.9* 32.5*  PLT 248 250 346 307    COAGS: Recent Labs    08/05/21 1548  INR 1.1  APTT 26    BMP: Recent Labs    08/05/21 1548 08/06/21 0435 08/07/21 0319 09/30/21 1141  NA 133* 135 136 140  K 3.7 3.8 4.2 2.9*  CL 101 107 110 103  CO2 24 22 21* 28  GLUCOSE 86 73 128* 90  BUN 26* 25* 22 9  CALCIUM 8.8* 8.3* 8.2* 8.9  CREATININE 1.31* 1.08* 1.04* 0.83  GFRNONAA 39* 49* 52* >60    LIVER FUNCTION TESTS: Recent Labs    08/05/21 1548 08/06/21 0435 08/07/21 0319 09/30/21 1141  BILITOT 0.5 0.4 0.2* 0.5  AST _0 14*  ALT _1 ALKPHOS 55 48 43 62  PROT 8.3* 7.2 6.9 8.4*  ALBUMIN 3.3* 2.8* 2.6* 3.1*    TUMOR MARKERS: No results for input(s): "AFPTM", "CEA", "CA199", "CHROMGRNA" in the last 8760 hours.  Assessment and Plan:  Scheduled for Port a cath placement Iron deficiency anemia requiring many labs and infusions Has extremely poor venous access Hematology requesting Port Risks and benefits of image guided port-a-catheter placement was discussed with the patient including, but not limited to bleeding, infection, pneumothorax, or fibrin sheath development and need for additional procedures.  All of the patient's questions were answered, patient is agreeable to proceed. Consent signed and in chart.   Thank you for this interesting consult.  I greatly enjoyed meeting MURRAY GUZZETTA and look forward to participating in their care.  A copy of this report was sent to the requesting provider on this date.  Electronically Signed: Lavonia Drafts, PA-C 11/04/2021, 9:52 AM   I spent a total of    25 Minutes in face to face in clinical consultation, greater than 50% of which was counseling/coordinating care for [facility] placement

## 2021-11-04 NOTE — Procedures (Signed)
Pre Procedure Dx: Poor venous access Post Procedural Dx: Same  Successful placement of right IJ approach port-a-cath with tip at the superior caval atrial junction. The catheter is ready for immediate use.  Estimated Blood Loss: Trace  Complications: None immediate.  Jay Kebra Lowrimore, MD Pager #: [phone]   

## 2021-11-05 ENCOUNTER — Encounter (HOSPITAL_COMMUNITY)
Admission: RE | Admit: 2021-11-05 | Discharge: 2021-11-05 | Disposition: A | Discharge status: Home / Self Care | Payer: Medicare Other | Source: Ambulatory Visit | Attending: Physician Assistant | Admitting: Physician Assistant

## 2021-11-05 ENCOUNTER — Other Ambulatory Visit: Payer: Self-pay | Admitting: *Deleted

## 2021-11-05 DIAGNOSIS — D472 Monoclonal gammopathy: Secondary | ICD-10-CM | POA: Diagnosis not present

## 2021-11-05 DIAGNOSIS — C9000 Multiple myeloma not having achieved remission: Secondary | ICD-10-CM | POA: Diagnosis not present

## 2021-11-05 MED ORDER — FLUDEOXYGLUCOSE F - 18 (FDG) INJECTION
8.0300 | Freq: Once | INTRAVENOUS | Status: AC | PRN
  Administered 2021-11-05: 8.03 via INTRAVENOUS

## 2021-11-05 MED ORDER — LIDOCAINE-PRILOCAINE 2.5-2.5 % EX CREA: 1.0000 | TOPICAL_CREAM | CUTANEOUS | 1 refills | Status: DC | PRN

## 2021-11-06 ENCOUNTER — Inpatient Hospital Stay: Payer: Medicare Other

## 2021-11-06 ENCOUNTER — Ambulatory Visit: Payer: Medicare Other | Admitting: Physician Assistant

## 2021-11-06 VITALS — BP 139/57 | HR 75 | Temp 97.0°F | Resp 18

## 2021-11-06 DIAGNOSIS — Z79899 Other long term (current) drug therapy: Secondary | ICD-10-CM | POA: Diagnosis not present

## 2021-11-06 DIAGNOSIS — Z7952 Long term (current) use of systemic steroids: Secondary | ICD-10-CM | POA: Diagnosis not present

## 2021-11-06 DIAGNOSIS — E7800 Pure hypercholesterolemia, unspecified: Secondary | ICD-10-CM | POA: Diagnosis not present

## 2021-11-06 DIAGNOSIS — Z7902 Long term (current) use of antithrombotics/antiplatelets: Secondary | ICD-10-CM | POA: Diagnosis not present

## 2021-11-06 DIAGNOSIS — I129 Hypertensive chronic kidney disease with stage 1 through stage 4 chronic kidney disease, or unspecified chronic kidney disease: Secondary | ICD-10-CM | POA: Diagnosis not present

## 2021-11-06 DIAGNOSIS — D473 Essential (hemorrhagic) thrombocythemia: Secondary | ICD-10-CM | POA: Diagnosis not present

## 2021-11-06 DIAGNOSIS — D509 Iron deficiency anemia, unspecified: Secondary | ICD-10-CM

## 2021-11-06 DIAGNOSIS — I10 Essential (primary) hypertension: Secondary | ICD-10-CM | POA: Diagnosis not present

## 2021-11-06 DIAGNOSIS — D631 Anemia in chronic kidney disease: Secondary | ICD-10-CM | POA: Diagnosis not present

## 2021-11-06 DIAGNOSIS — N1831 Chronic kidney disease, stage 3a: Secondary | ICD-10-CM | POA: Diagnosis not present

## 2021-11-06 DIAGNOSIS — C9000 Multiple myeloma not having achieved remission: Secondary | ICD-10-CM | POA: Diagnosis not present

## 2021-11-06 DIAGNOSIS — I739 Peripheral vascular disease, unspecified: Secondary | ICD-10-CM | POA: Diagnosis not present

## 2021-11-06 DIAGNOSIS — G629 Polyneuropathy, unspecified: Secondary | ICD-10-CM | POA: Diagnosis not present

## 2021-11-06 MED ORDER — HEPARIN SOD (PORK) LOCK FLUSH 100 UNIT/ML IV SOLN
500.0000 [IU] | Freq: Once | INTRAVENOUS | Status: AC | PRN
  Administered 2021-11-06: 500 [IU]

## 2021-11-06 MED ORDER — SODIUM CHLORIDE 0.9% FLUSH
10.0000 mL | Freq: Once | INTRAVENOUS | Status: AC | PRN
  Administered 2021-11-06: 10 mL

## 2021-11-06 MED ORDER — SODIUM CHLORIDE 0.9 % IV SOLN: Freq: Once | INTRAVENOUS | Status: AC

## 2021-11-06 MED ORDER — LORATADINE 10 MG PO TABS
10.0000 mg | ORAL_TABLET | Freq: Once | ORAL | Status: AC
  Administered 2021-11-06: 10 mg via ORAL
  Filled 2021-11-06: qty 1

## 2021-11-06 MED ORDER — ACETAMINOPHEN 325 MG PO TABS
650.0000 mg | ORAL_TABLET | Freq: Once | ORAL | Status: AC
  Administered 2021-11-06: 650 mg via ORAL
  Filled 2021-11-06: qty 2

## 2021-11-06 MED ORDER — SODIUM CHLORIDE 0.9 % IV SOLN
300.0000 mg | Freq: Once | INTRAVENOUS | Status: AC
  Administered 2021-11-06: 300 mg via INTRAVENOUS
  Filled 2021-11-06: qty 300

## 2021-11-06 NOTE — Patient Instructions (Signed)
[facility]  Discharge Instructions: Thank you for choosing [facility] to provide your oncology and hematology care.  If you have a lab appointment with the Cancer Center, please come in thru the Main Entrance and check in at the main information desk.  Wear comfortable clothing and clothing appropriate for easy access to any Portacath or PICC line.   We strive to give you quality time with your provider. You may need to reschedule your appointment if you arrive late (15 or more minutes).  Arriving late affects you and other patients whose appointments are after yours.  Also, if you miss three or more appointments without notifying the office, you may be dismissed from the clinic at the provider's discretion.      For prescription refill requests, have your pharmacy contact our office and allow 72 hours for refills to be completed.    Today you received the following chemotherapy and/or immunotherapy agents Venofer 300 mg  Iron Sucrose Injection What is this medication? IRON SUCROSE (EYE ern SOO krose) treats low levels of iron (iron deficiency anemia) in people with kidney disease. Iron is a mineral that plays an important role in making red blood cells, which carry oxygen from your lungs to the rest of your body. This medicine may be used for other purposes; ask your health care provider or pharmacist if you have questions. COMMON BRAND NAME(S): Venofer What should I tell my care team before I take this medication? They need to know if you have any of these conditions: Anemia not caused by low iron levels Heart disease High levels of iron in the blood Kidney disease Liver disease An unusual or allergic reaction to iron, other medications, foods, dyes, or preservatives Pregnant or trying to get pregnant Breast-feeding How should I use this medication? This medication is for infusion into a vein. It is given in a hospital or clinic setting. Talk to  your care team about the use of this medication in children. While this medication may be prescribed for children as young as 2 years for selected conditions, precautions do apply. Overdosage: If you think you have taken too much of this medicine contact a poison control center or emergency room at once. NOTE: This medicine is only for you. Do not share this medicine with others. What if I miss a dose? It is important not to miss your dose. Call your care team if you are unable to keep an appointment. What may interact with this medication? Do not take this medication with any of the following: Deferoxamine Dimercaprol Other iron products This medication may also interact with the following: Chloramphenicol Deferasirox This list may not describe all possible interactions. Give your health care provider a list of all the medicines, herbs, non-prescription drugs, or dietary supplements you use. Also tell them if you smoke, drink alcohol, or use illegal drugs. Some items may interact with your medicine. What should I watch for while using this medication? Visit your care team regularly. Tell your care team if your symptoms do not start to get better or if they get worse. You may need blood work done while you are taking this medication. You may need to follow a special diet. Talk to your care team. Foods that contain iron include: whole grains/cereals, dried fruits, beans, or peas, leafy green vegetables, and organ meats (liver, kidney). What side effects may I notice from receiving this medication? Side effects that you should report to your care team as   soon as possible: Allergic reactions--skin rash, itching, hives, swelling of the face, lips, tongue, or throat Low blood pressure--dizziness, feeling faint or lightheaded, blurry vision Shortness of breath Side effects that usually do not require medical attention (report to your care team if they continue or are  bothersome): Flushing Headache Joint pain Muscle pain Nausea Pain, redness, or irritation at injection site This list may not describe all possible side effects. Call your doctor for medical advice about side effects. You may report side effects to FDA at 1-800-FDA-1088. Where should I keep my medication? This medication is given in a hospital or clinic and will not be stored at home. NOTE: This sheet is a summary. It may not cover all possible information. If you have questions about this medicine, talk to your doctor, pharmacist, or health care provider.  2023 Elsevier/Gold Standard (2007-04-01 00:00:00)       To help prevent nausea and vomiting after your treatment, we encourage you to take your nausea medication as directed.  BELOW ARE SYMPTOMS THAT SHOULD BE REPORTED IMMEDIATELY: *FEVER GREATER THAN 100.4 F (38 C) OR HIGHER *CHILLS OR SWEATING *NAUSEA AND VOMITING THAT IS NOT CONTROLLED WITH YOUR NAUSEA MEDICATION *UNUSUAL SHORTNESS OF BREATH *UNUSUAL BRUISING OR BLEEDING *URINARY PROBLEMS (pain or burning when urinating, or frequent urination) *BOWEL PROBLEMS (unusual diarrhea, constipation, pain near the anus) TENDERNESS IN MOUTH AND THROAT WITH OR WITHOUT PRESENCE OF ULCERS (sore throat, sores in mouth, or a toothache) UNUSUAL RASH, SWELLING OR PAIN  UNUSUAL VAGINAL DISCHARGE OR ITCHING   Items with * indicate a potential emergency and should be followed up as soon as possible or go to the Emergency Department if any problems should occur.  Please show the CHEMOTHERAPY ALERT CARD or IMMUNOTHERAPY ALERT CARD at check-in to the Emergency Department and triage nurse.  Should you have questions after your visit or need to cancel or reschedule your appointment, please contact [facility] [phone]  and follow the prompts.  Office hours are 8:00 a.m. to 4:30 p.m. Monday - Friday. Please note that voicemails left after 4:00 p.m. may not be returned until  the following business day.  We are closed weekends and major holidays. You have access to a nurse at all times for urgent questions. Please call the main number to the clinic [phone] and follow the prompts.  For any non-urgent questions, you may also contact your provider using MyChart. We now offer e-Visits for anyone 18 and older to request care online for non-urgent symptoms. For details visit [email or web address].   Also download the MyChart app! Go to the app store, search "MyChart", open the app, select [facility], and log in with your MyChart username and password.  Masks are optional in the cancer centers. If you would like for your care team to wear a mask while they are taking care of you, please let them know. You may have one support person who is at least 86 years old accompany you for your appointments.  

## 2021-11-06 NOTE — Progress Notes (Signed)
Patient presents today for Venofer 300 mg . Vital signs stable. Patient has no complaints today. MAR reviewed and updated.   Venofer 300 mg given today per MD orders. Tolerated infusion without adverse affects. Vital signs stable. No complaints at this time. Discharged from clinic ambulatory in stable condition. Alert and oriented x 3. F/U with [facility] as scheduled.

## 2021-11-06 NOTE — Progress Notes (Signed)
Patient recently had port placed, her incision shows no signs or symptoms of infection. RN noted incision was not approximated. Small amount of serosanguinous drainage noted. 2x2 applied with dressing after accessing port .   At discharge, 1/4 inch steri strips applied to incision . Instructed patient that these would fall off on there own. Patient understood.

## 2021-11-09 ENCOUNTER — Encounter (HOSPITAL_COMMUNITY): Payer: Self-pay | Admitting: Physician Assistant

## 2021-11-09 ENCOUNTER — Ambulatory Visit: Payer: Medicare Other | Admitting: Physician Assistant

## 2021-11-09 NOTE — Progress Notes (Unsigned)
Leslie Williamson, [place] [address]   CLINIC:  Medical Oncology/Hematology  PCP:  Carrolyn Meiers, MD [facility] [id number] [phone]   REASON FOR VISIT:  Follow-up for new diagnosis of MULTIPLE MYELOMA in the setting of prior anemia, JAK2 positive essential thrombocytosis, MGUS   CURRENT THERAPY: Hydrea, oral iron, intermittent IV iron infusions  INTERVAL HISTORY:  Leslie Williamson 88 y.o. female returns today to discuss results of recent work-up for multiple myeloma.  She has been previously followed at our clinic for her JAK2 positive essential thrombocytosis, iron deficiency anemia, and MGUS.  She was last seen by [name] PA-C on 10/07/2021.  She denies any chest pain on exertion, presyncopal episodes, palpitations.  She denies dyspnea on exertion.  She has not had any recent epistaxis, hematemesis, hematochezia, or melena.  She denies erythromelalgia, aquagenic pruritus, vasomotor symptoms.  No new bone pains.  No dizziness, new onset hearing loss, blurry vision, or new neurologic deficits.  She does have some chronic intermittent tingling in her hands and feet, but states that she is not diabetic; this has been no worse than usual.  She denies any B symptoms such as fever, chills, night sweats, unintentional weight loss.  She has not noticed any new lumps, bumps, or lymphadenopathy.  She has 80% energy and 100% appetite. She endorses that she is maintaining a stable weight.   REVIEW OF SYSTEMS:    Review of Systems  Constitutional:  Positive for fatigue. Negative for appetite change, chills, diaphoresis, fever and unexpected weight change.  HENT:   Negative for lump/mass and nosebleeds.   Eyes:  Negative for eye problems.  Respiratory:  Negative for cough, hemoptysis and shortness of breath.   Cardiovascular:  Positive for leg swelling. Negative for chest pain and palpitations.  Gastrointestinal:  Negative  for abdominal pain, blood in stool, constipation, diarrhea, nausea and vomiting.  Genitourinary:  Negative for hematuria.   Skin: Negative.   Neurological:  Negative for dizziness, headaches and light-headedness.  Hematological:  Does not bruise/bleed easily.  Psychiatric/Behavioral:  Positive for sleep disturbance.      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   Vertigo, labyrinthine    Past Surgical History:  Procedure Laterality Date   CATARACT EXTRACTION W/PHACO Left 11/03/2015   Procedure: CATARACT EXTRACTION PHACO AND INTRAOCULAR LENS PLACEMENT ([facility]);  Surgeon: Tonny Branch, MD;  Location: [facility];  Service: Ophthalmology;  Laterality: Left;  CDE: 7.74   CATARACT EXTRACTION W/PHACO Right 11/20/2015   Procedure: CATARACT EXTRACTION PHACO AND INTRAOCULAR LENS PLACEMENT ; CDE:  8.30;  Surgeon: Tonny Branch, MD;  Location: [facility];  Service: Ophthalmology;  Laterality: Right;   IR IMAGING GUIDED PORT INSERTION  11/04/2021   PORT-A-CATH REMOVAL      SOCIAL HISTORY:  Social History   Socioeconomic History   Marital status: Widowed  Spouse name: Not on file   Number of children: Not on file   Years of education: Not on file   Highest education level: Not on file  Occupational History   Not on file  Tobacco Use   Smoking status: Former   Smokeless tobacco: Never  Vaping Use   Vaping Use: Never used  Substance and Sexual Activity   Alcohol use: No   Drug use:  No   Sexual activity: Not Currently  Other Topics Concern   Not on file  Social History Narrative   Not on file   Social Determinants of Health   Financial Resource Strain: Low Risk  (12/31/2019)   Overall Financial Resource Strain (CARDIA)    Difficulty of Paying Living Expenses: Not hard at all  Food Insecurity: No Food Insecurity (12/31/2019)   Hunger Vital Sign    Worried About Running Out of Food in the Last Year: Never true    New Site in the Last Year: Never true  Transportation Needs: No Transportation Needs (12/31/2019)   PRAPARE - Hydrologist (Medical): No    Lack of Transportation (Non-Medical): No  Physical Activity: Inactive (12/31/2019)   Exercise Vital Sign    Days of Exercise per Week: 0 days    Minutes of Exercise per Session: 0 min  Stress: No Stress Concern Present (12/31/2019)   [place]    Feeling of Stress : Not at all  Social Connections: Moderately Isolated (12/31/2019)   Social Connection and Isolation Panel [NHANES]    Frequency of Communication with Friends and Family: More than three times a week    Frequency of Social Gatherings with Friends and Family: More than three times a week    Attends Religious Services: More than 4 times per year    Active Member of [employer] or Organizations: No    Attends Archivist Meetings: Never    Marital Status: Widowed  Intimate Partner Violence: Not At Risk (12/31/2019)   Humiliation, Afraid, Rape, and Kick questionnaire    Fear of Current or Ex-Partner: No    Emotionally Abused: No    Physically Abused: No    Sexually Abused: No    FAMILY HISTORY:  Family History  Problem Relation Age of Onset   Diabetes Mother     CURRENT MEDICATIONS:  Outpatient Encounter Medications as of 11/10/2021  Medication Sig   amLODipine (NORVASC) 2.5 MG tablet Take 2.5 mg by mouth daily.   amLODipine (NORVASC) 5 MG tablet  Take 1 tablet (5 mg total) by mouth daily.   aspirin 81 MG tablet Take 81 mg by mouth daily.   atorvastatin (LIPITOR) 40 MG tablet Take 40 mg by mouth daily.   busPIRone (BUSPAR) 5 MG tablet Take 5 mg by mouth at bedtime as needed (anxiety/sleep).   cilostazol (PLETAL) 100 MG tablet Take 100 mg by mouth 2 (two) times daily.   ferrous sulfate 325 (65 FE) MG EC tablet Take 1 tablet (325 mg total) by mouth daily with breakfast.   lidocaine-prilocaine (EMLA) cream Apply 1 Application topically as needed.   predniSONE (DELTASONE) 50 MG tablet Take 1 tablet (50 mg total) by mouth daily with breakfast.   No facility-administered encounter medications on file as of 11/10/2021.    ALLERGIES:  Allergies  Allergen Reactions   Penicillins Rash     PHYSICAL EXAM:  ECOG PERFORMANCE STATUS: 1 - Symptomatic but completely ambulatory  There were no vitals filed for this visit. There were no vitals filed for this visit. Physical Exam Constitutional:      Appearance: Normal appearance. She is obese.  HENT:     Head: Normocephalic and atraumatic.     Mouth/Throat:     Mouth: Mucous membranes are moist.  Eyes:     Extraocular Movements: Extraocular movements intact.     Pupils: Pupils are equal, round, and reactive to light.  Cardiovascular:     Rate and Rhythm: Normal rate and regular rhythm.     Pulses: Normal pulses.     Heart sounds: Normal heart sounds.  Pulmonary:     Effort: Pulmonary effort is normal.     Breath sounds: Normal breath sounds.  Abdominal:     General: Bowel sounds are normal.     Palpations: Abdomen is soft.     Tenderness: There is no abdominal tenderness.  Musculoskeletal:        General: No swelling.     Right lower leg: Edema (trace) present.     Left lower leg: Edema (trace) present.  Lymphadenopathy:     Head:     Right side of head: No submental, submandibular, tonsillar, preauricular, posterior auricular or occipital adenopathy.     Left side of head: No  submental, submandibular, tonsillar, preauricular, posterior auricular or occipital adenopathy.     Cervical: No cervical adenopathy.     Right cervical: No superficial, deep or posterior cervical adenopathy.    Left cervical: No superficial, deep or posterior cervical adenopathy.     Upper Body:     Right upper body: No supraclavicular, axillary, pectoral or epitrochlear adenopathy.     Left upper body: No supraclavicular, axillary, pectoral or epitrochlear adenopathy.     Lower Body: No right inguinal adenopathy. No left inguinal adenopathy.  Skin:    General: Skin is warm and dry.  Neurological:     General: No focal deficit present.     Mental Status: She is alert and oriented to person, place, and time.  Psychiatric:        Mood and Affect: Mood normal.        Behavior: Behavior normal.    LABORATORY DATA:  I have reviewed the labs as listed.  CBC    Component Value Date/Time   WBC 7.1 11/02/2021 0846   RBC 3.19 (L) 11/02/2021 0846   HGB 10.2 (L) 11/02/2021 0846   HCT 32.5 (L) 11/02/2021 0846   PLT 307 11/02/2021 0846   MCV 101.9 (H) 11/02/2021 0846   MCH 32.0 11/02/2021 0846   MCHC 31.4 11/02/2021 0846   RDW 20.2 (H) 11/02/2021 0846   LYMPHSABS 3.3 11/02/2021 0846   MONOABS 0.9 11/02/2021 0846   EOSABS 0.0 11/02/2021 0846   BASOSABS 0.0 11/02/2021 0846      Latest Ref Rng & Units 09/30/2021   11:41 AM 08/07/2021    3:19 AM 08/06/2021    4:35 AM  CMP  Glucose 70 - 99 mg/dL 90  128  73   BUN 8 - 23 mg/dL 9  22  25    Creatinine 0.44 - 1.00 mg/dL 0.83  1.04  1.08   Sodium 135 - 145 mmol/L 140  136  135   Potassium 3.5 - 5.1 mmol/L 2.9  4.2  3.8   Chloride 98 - 111 mmol/L 103  110  107   CO2 22 - 32 mmol/L 28  21  22    Calcium 8.9 - 10.3 mg/dL 8.9  8.2  8.3   Total Protein 6.5 - 8.1 g/dL 8.4  6.9  7.2   Total Bilirubin 0.3 - 1.2 mg/dL 0.5  0.2  0.4   Alkaline Phos 38 - 126 U/L 62  43  48   AST 15 - 41 U/L 14  18  19    ALT 0 - 44 U/L 11  12  12      DIAGNOSTIC  IMAGING:  I have independently reviewed the relevant imaging and discussed with the patient.  ASSESSMENT & PLAN:    1.  MULTIPLE MYELOMA - MGUS diagnosed in April 2022 during investigation for cause of patient's anemia: SPEP showed M spike 1.7 and immunofixation showed monoclonal protein up with IgG lambda and IgM kappa - M-spike has persistently trended upward since April 2022, but patient refused bone marrow biopsy on multiple occasions prior to deciding to pursue further work-up - Most recent myeloma labs (August 2023) showed M spike 2.5 as well as worsening anemia with hemoglobin 9.3 - Bone marrow biopsy (11/02/2021): Plasma cell neoplasm involving approximately 70% overall of the cellular marrow, consistent with plasma cell myeloma - Molecular pathology (11/02/2021): 13q deletion (monosomy 13), duplication of 1q, and gain of CCND1/gain of chromosome 11 (hyperploidy) - PET scan (11/05/2021): No evidence of metabolically active multiple myeloma, no hypermetabolic adenopathy or other findings to suggest recurrent lymphoma - No new bone pains, or B symptoms.   She does have some intermittent peripheral neuropathy.        - PLAN: Discussed with patient that her testing shows multiple myeloma cancer.  We discussed briefly, and treatment options and we will set her up for office visit with Dr. Delton Coombes to decide on treatment plan.  2.  JAK2 positive essential thrombocytosis - Major manifestation is thrombocytosis.  She also has previous history of splenectomy. - Takes 81 mg aspirin daily - Hydroxyurea discontinued in June 2023 due to worsening anemia - Bone marrow biopsy (11/02/2021) showed plasma cell myeloma as above, and was negative for any definitive current morphologic evidence of patient's known JAK2 positive essential thrombocytosis.  No morphologic evidence of fibrosis. - Most recent CBC (11/02/2021): Platelets normal at 307 - PLAN: Continue to HOLD off on Hydrea.   3.  Iron deficiency  anemia and anemia of CKD: - Patient has longstanding iron deficiency anemia, has been seen at this clinic for over 10 years - Intermittent Venofer infusions, last given on 12/18/2020 and 12/25/2020 - Patient failed to improve on oral iron therapy - Patient had 1/3 stool occult blood cards positive, was referred to gastroenterology who did not feel any EGD/colonoscopy was necessary at this time. - Denies any gross rectal hemorrhage or melena.         - Patient has underlying CKD stage IIIa/IIIb which is likely also contributing to anemia.  She does not see a nephrologist. - Multifactorial anemia secondary to CKD, intermittent iron deficiency; may also be some suppressive effect from Hydrea. - Symptomatic with fatigue and mild dyspnea on exertion   - Iron panel (09/30/2021): Ferritin 45, iron saturation 19% - CBC (09/30/2021): Hgb 9.3/MCV 103.2 - Nutritional panel (09/30/2021): Normal vitamin B12, folate, homocystine, and methylmalonic acid  - PLAN: Continue with previously recommended course of IV Venofer 300 mg x 3. - Would consider initiation of Retacrit if she continues to have Hgb <10.0 and absence of any other cause. - Recheck CBC and iron panel in 6 to 8 weeks   4.  History of marginal zone lymphoma of the spleen: -  Diagnosed in 1999, status post CHOP therapy x7 cycles followed by rituximab for 1 cycle. - Posttreatment CT scan showed complete response. - Status post splenectomy - No B symptoms at this time.  No palpable lymphadenopathy on exam.         - PLAN: Continue monitoring with follow-up visits   5.  Anxiety: -Patient is taking BuSpar 5 mg twice daily - PLAN: Continue BuSpar as needed.   All questions were answered. The patient knows to call the clinic with any problems, questions or concerns.  Medical decision making: Moderate     Time spent on visit: I spent 25 minutes counseling the patient face to face. The total time spent in the appointment was 30 minutes and more than 50%  was on counseling.   Harriett Rush, PA-C  11/10/2021 10:04 AM

## 2021-11-10 ENCOUNTER — Inpatient Hospital Stay (HOSPITAL_BASED_OUTPATIENT_CLINIC_OR_DEPARTMENT_OTHER): Payer: Medicare Other | Admitting: Physician Assistant

## 2021-11-10 VITALS — BP 152/85 | HR 99 | Temp 97.7°F | Resp 19 | Ht 60.0 in | Wt 157.0 lb

## 2021-11-10 DIAGNOSIS — C9000 Multiple myeloma not having achieved remission: Secondary | ICD-10-CM | POA: Diagnosis not present

## 2021-11-10 DIAGNOSIS — I129 Hypertensive chronic kidney disease with stage 1 through stage 4 chronic kidney disease, or unspecified chronic kidney disease: Secondary | ICD-10-CM | POA: Diagnosis not present

## 2021-11-10 DIAGNOSIS — D509 Iron deficiency anemia, unspecified: Secondary | ICD-10-CM

## 2021-11-10 DIAGNOSIS — D473 Essential (hemorrhagic) thrombocythemia: Secondary | ICD-10-CM | POA: Diagnosis not present

## 2021-11-10 DIAGNOSIS — E7800 Pure hypercholesterolemia, unspecified: Secondary | ICD-10-CM | POA: Diagnosis not present

## 2021-11-10 DIAGNOSIS — Z79899 Other long term (current) drug therapy: Secondary | ICD-10-CM | POA: Diagnosis not present

## 2021-11-10 DIAGNOSIS — Z7902 Long term (current) use of antithrombotics/antiplatelets: Secondary | ICD-10-CM | POA: Diagnosis not present

## 2021-11-10 DIAGNOSIS — I739 Peripheral vascular disease, unspecified: Secondary | ICD-10-CM | POA: Diagnosis not present

## 2021-11-10 DIAGNOSIS — Z7952 Long term (current) use of systemic steroids: Secondary | ICD-10-CM | POA: Diagnosis not present

## 2021-11-10 DIAGNOSIS — I10 Essential (primary) hypertension: Secondary | ICD-10-CM | POA: Diagnosis not present

## 2021-11-10 DIAGNOSIS — G629 Polyneuropathy, unspecified: Secondary | ICD-10-CM | POA: Diagnosis not present

## 2021-11-10 DIAGNOSIS — D631 Anemia in chronic kidney disease: Secondary | ICD-10-CM | POA: Diagnosis not present

## 2021-11-10 DIAGNOSIS — N1831 Chronic kidney disease, stage 3a: Secondary | ICD-10-CM | POA: Diagnosis not present

## 2021-11-10 NOTE — Patient Instructions (Signed)
[facility] **   You were seen today by [name] PA-C for your follow-up visit.   As we discussed, your bone marrow biopsy and PET scan showed that you have a type of cancer called multiple myeloma. You have an office visit with Dr. Delton Coombes this Monday, 11/16/2021 at 10:15 AM to discuss treatment options.   ** Thank you for trusting me with your healthcare!  I strive to provide all of my patients with quality care at each visit.  If you receive a survey for this visit, I would be so grateful to you for taking the time to provide feedback.  Thank you in advance!  ~ Nicolasa Milbrath                   Dr. Derek Jack   &   [name], PA-C   - - - - - - - - - - - - - - - - - -    Thank you for choosing [facility] to provide your oncology and hematology care.  To afford each patient quality time with our provider, please arrive at least 15 minutes before your scheduled appointment time.   If you have a lab appointment with the [facility] please come in thru the Main Entrance and check in at the main information desk.  You need to re-schedule your appointment should you arrive 10 or more minutes late.  We strive to give you quality time with our providers, and arriving late affects you and other patients whose appointments are after yours.  Also, if you no show three or more times for appointments you may be dismissed from the clinic at the providers discretion.     Again, thank you for choosing [facility].  Our hope is that these requests will decrease the amount of time that you wait before being seen by our physicians.       _____________________________________________________________  Should you have questions after your visit to [facility], please contact our office at [phone] and follow the prompts.  Our office hours are  8:00 a.m. and 4:30 p.m. Monday - Friday.  Please note that voicemails left after 4:00 p.m. may not be returned until the following business day.  We are closed weekends and major holidays.  You do have access to a nurse 24-7, just call the main number to the clinic [phone] and do not press any options, hold on the line and a nurse will answer the phone.    For prescription refill requests, have your pharmacy contact our office and allow 72 hours.

## 2021-11-11 ENCOUNTER — Encounter (HOSPITAL_COMMUNITY): Payer: Self-pay

## 2021-11-13 ENCOUNTER — Inpatient Hospital Stay: Payer: Medicare Other

## 2021-11-13 VITALS — BP 131/56 | HR 74 | Temp 97.8°F | Resp 18

## 2021-11-13 DIAGNOSIS — N1831 Chronic kidney disease, stage 3a: Secondary | ICD-10-CM | POA: Diagnosis not present

## 2021-11-13 DIAGNOSIS — D509 Iron deficiency anemia, unspecified: Secondary | ICD-10-CM

## 2021-11-13 DIAGNOSIS — D631 Anemia in chronic kidney disease: Secondary | ICD-10-CM | POA: Diagnosis not present

## 2021-11-13 DIAGNOSIS — C9000 Multiple myeloma not having achieved remission: Secondary | ICD-10-CM | POA: Diagnosis not present

## 2021-11-13 DIAGNOSIS — Z79899 Other long term (current) drug therapy: Secondary | ICD-10-CM | POA: Diagnosis not present

## 2021-11-13 DIAGNOSIS — E7800 Pure hypercholesterolemia, unspecified: Secondary | ICD-10-CM | POA: Diagnosis not present

## 2021-11-13 DIAGNOSIS — G629 Polyneuropathy, unspecified: Secondary | ICD-10-CM | POA: Diagnosis not present

## 2021-11-13 DIAGNOSIS — Z7902 Long term (current) use of antithrombotics/antiplatelets: Secondary | ICD-10-CM | POA: Diagnosis not present

## 2021-11-13 DIAGNOSIS — I739 Peripheral vascular disease, unspecified: Secondary | ICD-10-CM | POA: Diagnosis not present

## 2021-11-13 DIAGNOSIS — I129 Hypertensive chronic kidney disease with stage 1 through stage 4 chronic kidney disease, or unspecified chronic kidney disease: Secondary | ICD-10-CM | POA: Diagnosis not present

## 2021-11-13 DIAGNOSIS — D473 Essential (hemorrhagic) thrombocythemia: Secondary | ICD-10-CM | POA: Diagnosis not present

## 2021-11-13 DIAGNOSIS — Z7952 Long term (current) use of systemic steroids: Secondary | ICD-10-CM | POA: Diagnosis not present

## 2021-11-13 DIAGNOSIS — I10 Essential (primary) hypertension: Secondary | ICD-10-CM | POA: Diagnosis not present

## 2021-11-13 MED ORDER — SODIUM CHLORIDE 0.9% FLUSH
10.0000 mL | Freq: Once | INTRAVENOUS | Status: AC | PRN
  Administered 2021-11-13: 10 mL

## 2021-11-13 MED ORDER — ACETAMINOPHEN 325 MG PO TABS
650.0000 mg | ORAL_TABLET | Freq: Once | ORAL | Status: AC
  Administered 2021-11-13: 650 mg via ORAL
  Filled 2021-11-13: qty 2

## 2021-11-13 MED ORDER — HEPARIN SOD (PORK) LOCK FLUSH 100 UNIT/ML IV SOLN
500.0000 [IU] | Freq: Once | INTRAVENOUS | Status: AC | PRN
  Administered 2021-11-13: 500 [IU]

## 2021-11-13 MED ORDER — LORATADINE 10 MG PO TABS
10.0000 mg | ORAL_TABLET | Freq: Once | ORAL | Status: AC
  Administered 2021-11-13: 10 mg via ORAL
  Filled 2021-11-13: qty 1

## 2021-11-13 MED ORDER — SODIUM CHLORIDE 0.9 % IV SOLN
300.0000 mg | Freq: Once | INTRAVENOUS | Status: AC
  Administered 2021-11-13: 300 mg via INTRAVENOUS
  Filled 2021-11-13: qty 300

## 2021-11-13 MED ORDER — SODIUM CHLORIDE 0.9 % IV SOLN: Freq: Once | INTRAVENOUS | Status: AC

## 2021-11-13 NOTE — Patient Instructions (Signed)
[facility]  Discharge Instructions: Thank you for choosing [facility] to provide your oncology and hematology care.  If you have a lab appointment with the Cancer Center, please come in thru the Main Entrance and check in at the main information desk.  Wear comfortable clothing and clothing appropriate for easy access to any Portacath or PICC line.   We strive to give you quality time with your provider. You may need to reschedule your appointment if you arrive late (15 or more minutes).  Arriving late affects you and other patients whose appointments are after yours.  Also, if you miss three or more appointments without notifying the office, you may be dismissed from the clinic at the provider's discretion.      For prescription refill requests, have your pharmacy contact our office and allow 72 hours for refills to be completed.     To help prevent nausea and vomiting after your treatment, we encourage you to take your nausea medication as directed.  BELOW ARE SYMPTOMS THAT SHOULD BE REPORTED IMMEDIATELY: *FEVER GREATER THAN 100.4 F (38 C) OR HIGHER *CHILLS OR SWEATING *NAUSEA AND VOMITING THAT IS NOT CONTROLLED WITH YOUR NAUSEA MEDICATION *UNUSUAL SHORTNESS OF BREATH *UNUSUAL BRUISING OR BLEEDING *URINARY PROBLEMS (pain or burning when urinating, or frequent urination) *BOWEL PROBLEMS (unusual diarrhea, constipation, pain near the anus) TENDERNESS IN MOUTH AND THROAT WITH OR WITHOUT PRESENCE OF ULCERS (sore throat, sores in mouth, or a toothache) UNUSUAL RASH, SWELLING OR PAIN  UNUSUAL VAGINAL DISCHARGE OR ITCHING   Items with * indicate a potential emergency and should be followed up as soon as possible or go to the Emergency Department if any problems should occur.  Please show the CHEMOTHERAPY ALERT CARD or IMMUNOTHERAPY ALERT CARD at check-in to the Emergency Department and triage nurse.  Should you have questions after your visit or need to  cancel or reschedule your appointment, please contact [facility] [phone]  and follow the prompts.  Office hours are 8:00 a.m. to 4:30 p.m. Monday - Friday. Please note that voicemails left after 4:00 p.m. may not be returned until the following business day.  We are closed weekends and major holidays. You have access to a nurse at all times for urgent questions. Please call the main number to the clinic [phone] and follow the prompts.  For any non-urgent questions, you may also contact your provider using MyChart. We now offer e-Visits for anyone 18 and older to request care online for non-urgent symptoms. For details visit [email or web address].   Also download the MyChart app! Go to the app store, search "MyChart", open the app, select [facility], and log in with your MyChart username and password.  Masks are optional in the cancer centers. If you would like for your care team to wear a mask while they are taking care of you, please let them know. You may have one support person who is at least 86 years old accompany you for your appointments.  

## 2021-11-13 NOTE — Progress Notes (Signed)
Patient presents today for iron infusion.  Patient is in satisfactory condition with no complaints voiced.  Vital signs are stable.  We will proceed with infusion per provider orders.   Patient tolerated treatment well with no complaints voiced.  Patient left ambulatory in stable condition.  Vital signs stable at discharge.  Follow up as scheduled.    

## 2021-11-16 ENCOUNTER — Inpatient Hospital Stay (HOSPITAL_BASED_OUTPATIENT_CLINIC_OR_DEPARTMENT_OTHER): Payer: Medicare Other | Admitting: Hematology

## 2021-11-16 VITALS — BP 154/86 | HR 92 | Temp 97.9°F | Resp 18 | Ht 60.0 in | Wt 157.3 lb

## 2021-11-16 DIAGNOSIS — C9000 Multiple myeloma not having achieved remission: Secondary | ICD-10-CM

## 2021-11-16 DIAGNOSIS — I10 Essential (primary) hypertension: Secondary | ICD-10-CM | POA: Diagnosis not present

## 2021-11-16 DIAGNOSIS — Z79899 Other long term (current) drug therapy: Secondary | ICD-10-CM | POA: Diagnosis not present

## 2021-11-16 DIAGNOSIS — E7800 Pure hypercholesterolemia, unspecified: Secondary | ICD-10-CM | POA: Diagnosis not present

## 2021-11-16 DIAGNOSIS — I129 Hypertensive chronic kidney disease with stage 1 through stage 4 chronic kidney disease, or unspecified chronic kidney disease: Secondary | ICD-10-CM | POA: Diagnosis not present

## 2021-11-16 DIAGNOSIS — D509 Iron deficiency anemia, unspecified: Secondary | ICD-10-CM | POA: Diagnosis not present

## 2021-11-16 DIAGNOSIS — Z7902 Long term (current) use of antithrombotics/antiplatelets: Secondary | ICD-10-CM | POA: Diagnosis not present

## 2021-11-16 DIAGNOSIS — G629 Polyneuropathy, unspecified: Secondary | ICD-10-CM | POA: Diagnosis not present

## 2021-11-16 DIAGNOSIS — Z7952 Long term (current) use of systemic steroids: Secondary | ICD-10-CM | POA: Diagnosis not present

## 2021-11-16 DIAGNOSIS — I739 Peripheral vascular disease, unspecified: Secondary | ICD-10-CM | POA: Diagnosis not present

## 2021-11-16 DIAGNOSIS — D631 Anemia in chronic kidney disease: Secondary | ICD-10-CM | POA: Diagnosis not present

## 2021-11-16 DIAGNOSIS — D473 Essential (hemorrhagic) thrombocythemia: Secondary | ICD-10-CM | POA: Diagnosis not present

## 2021-11-16 DIAGNOSIS — N1831 Chronic kidney disease, stage 3a: Secondary | ICD-10-CM | POA: Diagnosis not present

## 2021-11-16 MED ORDER — ACYCLOVIR 400 MG PO TABS: 400.0000 mg | ORAL_TABLET | Freq: Two times a day (BID) | ORAL | 6 refills | Status: DC

## 2021-11-16 NOTE — Progress Notes (Signed)
START ON PATHWAY REGIMEN - Multiple Myeloma and Other Plasma Cell Dyscrasias     Cycles 1 and 2: A cycle is every 28 days:     Lenalidomide      Dexamethasone      Daratumumab and hyaluronidase-fihj    Cycles 3 through 6: A cycle is every 28 days:     Lenalidomide      Dexamethasone      Daratumumab and hyaluronidase-fihj    Cycles 7 and beyond: A cycle is every 28 days:     Lenalidomide      Dexamethasone      Daratumumab and hyaluronidase-fihj   **Always confirm dose/schedule in your pharmacy ordering system**  Patient Characteristics: Multiple Myeloma, Newly Diagnosed, Transplant Ineligible or Refused, High Risk Disease Classification: Multiple Myeloma R-ISS Staging: II Therapeutic Status: Newly Diagnosed Is Patient Eligible for Transplant<= Transplant Ineligible or Refused Risk Status: High Risk Intent of Therapy: Non-Curative / Palliative Intent, Discussed with Patient 

## 2021-11-16 NOTE — Progress Notes (Signed)
[place], [place] [address]   CLINIC:  Medical Oncology/Hematology  PCP:  Carrolyn Meiers, MD [place] [phone]   REASON FOR VISIT:  Follow-up for newly diagnosed multiple myeloma  PRIOR THERAPY: None  NGS Results: Not applicable  CURRENT THERAPY: Daratumumab, Revlimid and dexamethasone  BRIEF ONCOLOGIC HISTORY:  Oncology History  NHL (non-Hodgkin's lymphoma) ([place])  07/19/1995 Pathology Results   Spleen biopsy with resection- low-grade B-cell lymphoma, splenic marginal zone type   02/20/1998 Imaging   CT CAP-marked and extensive cervical adenopathy   02/25/1998 Pathology Results   Right cervical lymph node demonstrating large B-cell lymphoma   03/05/1998 Bone Marrow Biopsy   No evidence of bone marrow involvement   03/11/1998 - 07/18/1998 Chemotherapy   CHOP x 7 cycles   05/09/1998 Imaging   CT CAP and neck- slight interval decrease in size of para-aortic adenopathy with interval decrease in right inguinal and bi-iliac adenopathy.  Interval decrease in size and number of enlarged cervical nodes   07/10/1998 Imaging   Ct CAP and neck- unremarkable CT of chest. No enlarged retroperitoneal adenopathy withthe previously noted retroperitoneal nodes currently smaller to stable in size.  Cervical adenopathy is stable to slightly smaller in size.   08/18/1998 - 09/08/1998 Chemotherapy   Rituxan Day 1, 8, 15, 22 x 1 cycle   01/08/1999 Remission   CT CAP and neck demonstrates no adenopathy or disease   01/08/1999 Imaging   CT CAP and neck- Stable CT of chest. Stable CT abd/pelvis without adenopathy.  Negative CT of neck     CANCER STAGING:  Cancer Staging  Multiple myeloma ([place]) Staging form: Plasma Cell Myeloma and Plasma Cell Disorders, AJCC 8th Edition - Clinical stage from 11/16/2021: Albumin (g/dL): 3.1, ISS: Stage II, High-risk cytogenetics: Absent, LDH: Normal -  Unsigned   INTERVAL HISTORY:  Ms. Hakimi 88 y.o. female seen for follow-up of bone marrow biopsy results.  She is accompanied by her son and daughter.  Denies any new onset pains.  Denies any tingling or numbness in extremities.  Energy levels are 80%.  Reports some difficulty staying asleep.    REVIEW OF SYSTEMS:  Review of Systems  Psychiatric/Behavioral:  Positive for sleep disturbance.   All other systems reviewed and are negative.    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   Vertigo, labyrinthine    Past Surgical History:  Procedure Laterality Date   CATARACT EXTRACTION W/PHACO Left 11/03/2015   Procedure: CATARACT EXTRACTION PHACO AND INTRAOCULAR LENS PLACEMENT ([facility] [facility]);  Surgeon: Tonny Branch, MD;  Location: [facility];  Service: Ophthalmology;  Laterality: Left;  CDE: 7.74   CATARACT EXTRACTION W/PHACO Right 11/20/2015   Procedure: CATARACT EXTRACTION PHACO AND INTRAOCULAR LENS PLACEMENT ; CDE:  8.30;  Surgeon: Tonny Branch, MD;  Location: [facility];  Service: Ophthalmology;  Laterality: Right;  IR IMAGING GUIDED PORT INSERTION  11/04/2021   PORT-A-CATH REMOVAL       SOCIAL HISTORY:  Social History   Socioeconomic History   Marital status: Widowed    Spouse name: Not on file   Number of children: Not on file   Years of education: Not on file   Highest education level: Not on file  Occupational History   Not on file  Tobacco Use   Smoking status: Former    Smokeless tobacco: Never  Vaping Use   Vaping Use: Never used  Substance and Sexual Activity   Alcohol use: No   Drug use: No   Sexual activity: Not Currently  Other Topics Concern   Not on file  Social History Narrative   Not on file   Social Determinants of Health   Financial Resource Strain: Low Risk  (12/31/2019)   Overall Financial Resource Strain (CARDIA)    Difficulty of Paying Living Expenses: Not hard at all  Food Insecurity: No Food Insecurity (12/31/2019)   Hunger Vital Sign    Worried About Running Out of Food in the Last Year: Never true    [place] in the Last Year: Never true  Transportation Needs: No Transportation Needs (12/31/2019)   PRAPARE - Hydrologist (Medical): No    Lack of Transportation (Non-Medical): No  Physical Activity: Inactive (12/31/2019)   Exercise Vital Sign    Days of Exercise per Week: 0 days    Minutes of Exercise per Session: 0 min  Stress: No Stress Concern Present (12/31/2019)   [place]    Feeling of Stress : Not at all  Social Connections: Moderately Isolated (12/31/2019)   Social Connection and Isolation Panel [NHANES]    Frequency of Communication with Friends and Family: More than three times a week    Frequency of Social Gatherings with Friends and Family: More than three times a week    Attends Religious Services: More than 4 times per year    Active Member of [employer] or Organizations: No    Attends Archivist Meetings: Never    Marital Status: Widowed  Intimate Partner Violence: Not At Risk (12/31/2019)   Humiliation, Afraid, Rape, and Kick questionnaire    Fear of Current or Ex-Partner: No    Emotionally Abused: No    Physically Abused: No    Sexually Abused: No    FAMILY HISTORY:  Family History  Problem Relation Age of Onset   Diabetes Mother     CURRENT MEDICATIONS:  Outpatient Encounter Medications as  of 11/16/2021  Medication Sig   acyclovir (ZOVIRAX) 400 MG tablet Take 1 tablet (400 mg total) by mouth 2 (two) times daily.   amLODipine (NORVASC) 5 MG tablet Take 1 tablet (5 mg total) by mouth daily.   aspirin 81 MG tablet Take 81 mg by mouth daily.   atorvastatin (LIPITOR) 40 MG tablet Take 40 mg by mouth daily.   busPIRone (BUSPAR) 5 MG tablet Take 5 mg by mouth at bedtime as needed (anxiety/sleep).   cilostazol (PLETAL) 100 MG tablet Take 100 mg by mouth 2 (two) times daily.   ferrous sulfate 325 (65 FE) MG EC tablet Take 1 tablet (325 mg total) by mouth daily with breakfast.   lidocaine-prilocaine (EMLA) cream Apply 1 Application topically as needed.   [DISCONTINUED] predniSONE (DELTASONE) 50 MG tablet Take 1 tablet (50 mg total) by mouth daily with breakfast.  No facility-administered encounter medications on file as of 11/16/2021.    ALLERGIES:  Allergies  Allergen Reactions   Penicillins Rash     PHYSICAL EXAM:  ECOG Performance status: 1  Vitals:   11/16/21 1018  BP: (!) 154/86  Pulse: 92  Resp: 18  Temp: 97.9 F (36.6 C)  SpO2: 95%   Filed Weights   11/16/21 1018  Weight: 157 lb 4.8 oz (71.4 kg)   Physical Exam Vitals reviewed.  Constitutional:      Appearance: Normal appearance.  Cardiovascular:     Rate and Rhythm: Normal rate and regular rhythm.     Heart sounds: Normal heart sounds.  Pulmonary:     Breath sounds: Normal breath sounds.  Abdominal:     Palpations: Abdomen is soft. There is no mass.  Neurological:     General: No focal deficit present.     Mental Status: She is alert and oriented to person, place, and time.  Psychiatric:        Mood and Affect: Mood normal.        Behavior: Behavior normal.      LABORATORY DATA:  I have reviewed the labs as listed.  CBC    Component Value Date/Time   WBC 7.1 11/02/2021 0846   RBC 3.19 (L) 11/02/2021 0846   HGB 10.2 (L) 11/02/2021 0846   HCT 32.5 (L) 11/02/2021 0846   PLT 307 11/02/2021  0846   MCV 101.9 (H) 11/02/2021 0846   MCH 32.0 11/02/2021 0846   MCHC 31.4 11/02/2021 0846   RDW 20.2 (H) 11/02/2021 0846   LYMPHSABS 3.3 11/02/2021 0846   MONOABS 0.9 11/02/2021 0846   EOSABS 0.0 11/02/2021 0846   BASOSABS 0.0 11/02/2021 0846      Latest Ref Rng & Units 09/30/2021   11:41 AM 08/07/2021    3:19 AM 08/06/2021    4:35 AM  CMP  Glucose 70 - 99 mg/dL 90  128  73   BUN 8 - 23 mg/dL 9  22  25    Creatinine 0.44 - 1.00 mg/dL 0.83  1.04  1.08   Sodium 135 - 145 mmol/L 140  136  135   Potassium 3.5 - 5.1 mmol/L 2.9  4.2  3.8   Chloride 98 - 111 mmol/L 103  110  107   CO2 22 - 32 mmol/L 28  21  22    Calcium 8.9 - 10.3 mg/dL 8.9  8.2  8.3   Total Protein 6.5 - 8.1 g/dL 8.4  6.9  7.2   Total Bilirubin 0.3 - 1.2 mg/dL 0.5  0.2  0.4   Alkaline Phos 38 - 126 U/L 62  43  48   AST 15 - 41 U/L 14  18  19    ALT 0 - 44 U/L 11  12  12      DIAGNOSTIC IMAGING:  I have independently reviewed the scans and discussed with the patient.  ASSESSMENT:  1.  Biclonal IgG lambda and IgM kappa multiple myeloma, high risk: - BMBX (11/02/2021): IgG lambda plasma cells 70% - Myeloma FISH panel: Monosomy 13 (standard GIST), duplication of 1 q. (high risk), gain of 11 q. (standard risk). - Cytogenetics: Hyperdiploid E weight gain (trisomy/trisomy) of odd-numbered chromosomes and monosomy 13, standard risk.  Gain of 1 q. associated with progressive course. - PET scan (11/05/2021): No evidence of multiple myeloma.  No hypermetabolic adenopathy.  2.  JAK2 positive essential thrombocytosis: - BMBX (11/02/2021): No evidence of fibrosis.  No morphological evidence of  JAK2 positive ET.  3.  History of marginal zone lymphoma of the spleen: - Diagnosed in 1999, status post CHOP x7 cycles followed by rituximab 1 cycle. - Status post splenectomy.  No evidence of lymphoma on current PET scan.  4.  Social/family history: - She lives with her sister.  Son lives 30 minutes away.  She is independent of ADLs and  some IADLs.  She drives.  She worked at [employer] and denies any exposure to chemicals or pesticides.  No family history of malignancies.    PLAN:  1.  IgG lambda multiple myeloma: - I have reviewed bone marrow biopsy results.  We discussed new diagnosis of multiple myeloma, staging and prognosis. - Recommend checking beta-2 microglobulin as her last test was done in April 2022.  We will also check free light chain ratio and immunofixation has baseline.  Previous immunofixation showed biclonal IgG lambda and IgM kappa.  However bone marrow plasma cells were predominantly IgG lambda. - Based on her age, she is not a candidate for bone marrow transplant. - I discussed treatment regimen with daratumumab, Revlimid and dexamethasone based on MAIA study. - We will start Revlimid dose at 50 mg 21 days of/7 days off.  We will limit dexamethasone to 20 mg weekly. - She will likely start treatment on 11/30/2021.  I will see her back on day 8 to see how she is tolerating.  2.  Anemia from CKD and iron deficiency: - Status post Venofer x3 from 10/23/2021 through 11/13/2021. - Hemoglobin on 11/02/2021 was 10.2.  We will closely monitor and prescribe IV iron as needed.  3.  ID prophylaxis: - We will start her on acyclovir twice daily for shingles prophylaxis.  Continue aspirin 81 mg daily for thromboprophylaxis.  4.  Bone protection: - We will consider DEXA scan and denosumab.      Orders placed this encounter:  Orders Placed This Encounter  Procedures   Beta 2 microglobulin, serum   Kappa/lambda light chains   Immunofixation electrophoresis      Derek Jack, MD [place] [phone]

## 2021-11-16 NOTE — Patient Instructions (Addendum)
[facility]  Discharge Instructions  You were seen and examined today by Dr. Delton Coombes.  Dr. Delton Coombes discussed your most recent bone marrow biopsy results which revealed a new diagnosis of Multiple Myeloma. This is a cancer arising within the bone marrow that results in an abnormal protein production. This is treated with a combination of pills and injections.  The chemotherapy pill will be sent straight to your home from a specialty pharmacy. Start it on the day that you come here for your first chemotherapy injection.  Continue baby aspirin everyday. Begin taking Acyclovir 457m twice daily to prevent reactivation of Shingles.  Follow-up as scheduled.  [facility].   To afford each patient quality time with our provider, please arrive at least 15 minutes before your scheduled appointment time. You may need to reschedule your appointment if you arrive late (10 or more minutes). Arriving late affects you and other patients whose appointments are after yours.  Also, if you miss three or more appointments without notifying the office, you may be dismissed from the clinic at the provider's discretion.    Again, [facility]  Our hope is that these requests will decrease the amount of time that you wait before being seen by our physicians.   If you have a lab appointment with the [facility] come in thru the Main Entrance and check in at the main information desk.           _____________________________________________________________  Should you have questions [facility] please contact our office at [phone] follow the prompts.  Our office hours are 8:00 a.m. to 4:30 p.m. Monday - Thursday and 8:00 a.m. to 2:30 p.m. Friday.  Please note that voicemails left after 4:00 p.m. may not  be returned until the following business day.  We are closed weekends and all major holidays.  You do have access to a nurse 24-7, just call the main number to the clinic [phone] do not press any options, hold on the line and a nurse will answer the phone.    For prescription refill requests, have your pharmacy contact our office and allow 72 hours.    Masks are optional in the cancer centers. If you would like for your care team to wear a mask while they are taking care of you, please let them know. You may have one support person who is at least 86years old accompany you for your appointments.

## 2021-11-16 NOTE — Progress Notes (Signed)
I met with the patient and her family during and following initial visit with Dr. Delton Coombes. Written information provided on the treatment regimen as discussed by Dr. Delton Coombes.

## 2021-11-17 ENCOUNTER — Other Ambulatory Visit: Payer: Self-pay

## 2021-11-17 MED ORDER — LENALIDOMIDE 15 MG PO CAPS: 15.0000 mg | ORAL_CAPSULE | Freq: Every day | ORAL | 0 refills | Status: DC

## 2021-11-17 NOTE — Telephone Encounter (Signed)
Prescription for Revlimid sent per Dr. Delton Coombes verbal order.

## 2021-11-18 ENCOUNTER — Encounter: Payer: Self-pay | Admitting: Hematology

## 2021-11-18 ENCOUNTER — Telehealth (HOSPITAL_COMMUNITY): Payer: Self-pay | Admitting: Pharmacist

## 2021-11-18 ENCOUNTER — Other Ambulatory Visit (HOSPITAL_COMMUNITY): Payer: Self-pay

## 2021-11-18 ENCOUNTER — Other Ambulatory Visit: Payer: Self-pay

## 2021-11-18 ENCOUNTER — Telehealth: Payer: Self-pay

## 2021-11-18 NOTE — Telephone Encounter (Signed)
Oral Oncology Patient Advocate Encounter   Received notification that prior authorization for Lenalidomide is required.   PA submitted on 09.27.23  Key [id number]  Status is pending     Leslie Williamson, [place] Patient [place]  [phone] (phone) [phone] (fax)

## 2021-11-18 NOTE — Telephone Encounter (Signed)
Oral Oncology Pharmacist Encounter  Received new prescription for Revlimid (lenalidomide) for the treatment of newly diagnosed biclonal IgG lambda and IgM kappa multiple myeloma in conjunction with daratumumab and dexamethasone, planned duration until disease control or unacceptable drug toxicity. Planned start 11/30/21.  CMP from 09/30/21 assessed, no relevant lab abnormalities. Prescription dose and frequency assessed. Dose adjusted appropriately for current renal function.  Current medication list in Epic reviewed, no DDIs with lenalidomide identified.  Evaluated chart and no patient barriers to medication adherence identified.   Prescription has been e-scribed to [facility] for benefits analysis and approval.  Oral Oncology Clinic will continue to follow for insurance authorization, copayment issues, initial counseling and start date.   Alyson N. Leonard, PharmD, BCPS, BCOP, CPP Hematology/Oncology Clinical Pharmacist Practitioner [place]/[facility] Oral Chemotherapy Navigation Clinic [phone]  11/18/2021 1:33 PM  

## 2021-11-18 NOTE — Telephone Encounter (Signed)
Oral Oncology Patient Advocate Encounter  Prior Authorization for Lenalidomide has been approved.    PA# [id number]  Effective dates: 11/18/2021 through 11/19/2022  Patients co-pay is $4.15.    Leslie Williamson, [facility] Patient [place]  [phone] (phone) [phone] (fax)

## 2021-11-19 NOTE — Patient Instructions (Addendum)
Deputy are diagnosed with multiple myeloma. We will treat you weekly in the clinic with a drug called daratumumab (Darzalex). This is an injection you will receive the under the skin in your abdomen. Also as part of your treatment, you will be taking a steroid called dexamethasone - you will take this weekly (39m). You may take on the days of your weekly injection. The final part of your treatment is a drug called Revlimid. This a chemo pill you will take daily for 21 days. After the 21 days, you will take a week off from taking this pill, then restart after those 7 days to repeat 21 days. This is an ongoing cycle. The intent of treatment is to get this disease under control and to alleviate any symptoms you may be having related to the myeloma. You will see the doctor regularly throughout treatment.  We will obtain blood work from you prior to every treatment and monitor your results to make sure it is safe to give your treatment. The doctor monitors your response to treatment by the way you are feeling, your blood work, and by obtaining scans periodically.  There will be wait times while you are here for treatment.  It will take about 30 minutes to 1 hour for your lab work to result. Then there will be wait times while pharmacy mixes your medications.    Daratumumab (Darzalex Faspro)  About This Drug Daratumumab is used to treat cancer. It is given under the skin in your abdomen (subcutaneously).  Possible Side Effects   You may have a reaction to the drug. Sometimes you may be given medication to stop or lessen these side effects. Your nurse will check you closely for these signs: fever or shaking chills, flushing, facial swelling, feeling dizzy, headache, trouble breathing, rash, itching, chest tightness, or chest pain. These reactions may happen after your infusion. If this happens, call 911 for emergency care.   Decrease in the number of white  blood cells and platelets. This may raise your risk of infection, and raise your risk of bleeding.   Fever and chills   Tiredness   Feeling dizzy   Trouble sleeping   Cough and trouble breathing   Upper respiratory infection   Nausea and throwing up (vomiting)   Loose bowel movements (diarrhea)   Constipation (not able to move bowels)   Muscle spasms   Pain in the joints   Back pain   Swelling of your legs, ankles and/or feet   Effects on the nerves are called peripheral neuropathy. You may feel numbness, tingling, or pain in your hands and feet. It may be hard for you to button your clothes, open jars, or walk as usual. The effect on the nerves may get worse with more doses of the drug. These effects get better in some people after the drug is stopped but it does not get better in all people.  Note: Each of the side effects above was reported in 20% or greater of patients treated with daratumumab. Not all possible side effects are included above.  Warnings and Precautions   Severe decrease in the number of white blood cells and platelets    Severe allergic reaction   This medication can affect the results of blood tests that match your blood type. Your blood type will be tested before treatment. Be sure to tell all healthcare providers you are taking this medicine before receiving blood transfusions, even for  6 months after your last dose.  Important Information   This drug may be present in the saliva, tears, sweat, urine, stool, vomit, semen, and vaginal secretions. Talk to your doctor and/or your nurse about the necessary precautions to take during this time.  Treating Side Effects   Drink plenty of fluids (a minimum of eight glasses per day is recommended).   If you throw up or have loose bowel movements, you should drink more fluids so that you do not become dehydrated (lack of water in the body from losing too much fluid).   To help with nausea and vomiting,  eat small, frequent meals instead of three large meals a day. Choose foods and drinks that are at room temperature. Ask your nurse or doctor about other helpful tips and medicine that is available to help stop or lessen these symptoms.   If you have diarrhea, eat low-fiber foods that are high in protein and calories and avoid foods that can irritate your digestive tracts or lead to cramping.   Ask your nurse or doctor about medicine that can lessen or stop your diarrhea or constipation.   If you are not able to move your bowels, check with your doctor or nurse before you use enemas, laxatives, or suppositories.   Manage tiredness by pacing your activities for the day.   Be sure to include periods of rest between energy-draining activities.   To decrease the risk of infection, wash your hands regularly.   Avoid close contact with people who have a cold, the flu, or other infections.   Take your temperature as your doctor or nurse tells you, and whenever you feel like you may have a fever.   To help decrease the risk of bleeding, use a soft toothbrush. Check with your nurse before using dental floss.   Be very careful when using knives or tools.   Use an electric shaver instead of a razor.   Keeping your pain under control is important to your well-being. Please tell your doctor or nurse if you are experiencing pain.   If you are dizzy, get up slowly after sitting or lying.   If you are having trouble sleeping, talk to your nurse or doctor on tips to help you sleep better.   If you have numbness and tingling in your hands and feet, be careful when cooking, walking, and handling sharp objects and hot liquids.   Infusion reactions may rarely occur after your infusion. If this happens, call 911 for emergency care.  Food and Drug Interactions   There are no known interactions of daratumumab with food.   This drug may interact with other medicines. Tell your doctor and pharmacist about  all the prescription and over-the-counter medicines and dietary supplements (vitamins, minerals, herbs and others) that you are taking at this time. Also, check with your doctor or pharmacist before starting any new prescription or over-the-counter medicines, or dietary supplements to make sure that there are no interactions.  When to Call the Doctor  Call your doctor or nurse if you have any of these symptoms and/or any new or unusual symptoms:   Fever of 100.4 F (38 C) or higher   Chills   Pain in your chest   Coughing up yellow, green, or bloody mucus.   Wheezing or trouble breathing   Tiredness that interferes with your daily activities   Trouble falling or staying asleep   Feeling dizzy or lightheaded   Easy bleeding or bruising  Nausea that stops you from eating or drinking and/or is not relieved by prescribed medicines   Vomiting   No bowel movement in 3 days or when you feel uncomfortable.   Loose bowel movements (diarrhea) 4 times a day or loose bowel movements with lack of strength or a feeling of being dizzy   Weight gain of 5 pounds in one week (fluid retention)   Swelling of your legs, ankles and/or feet   Pain that does not go away, or is not relieved by prescribed medicines   Signs of infusion reaction: fever or shaking chills, flushing, facial swelling, feeling dizzy, headache, trouble breathing, rash, itching, chest tightness, or chest pain. If this happens, call 911 for emergency care.   Numbness, tingling, or pain in your hands and feet   If you think you may be pregnant or may have impregnated your partner  Reproduction Warnings   Pregnancy warning: This drug may have harmful effects on the unborn baby. Women of childbearing potential should use effective methods of birth control during your cancer treatment and for at least 3 months after treatment. Let your doctor know right away if you think you may be pregnant.   Breastfeeding warning: It is  not known if this drug passes into breast milk. For this reason, women should talk to their doctor about the risks and benefits of breastfeeding during treatment with this drug because this drug may enter the breast milk and cause harm to a breastfeeding baby.   Fertility warning: Human fertility studies have not been done with this drug. Talk with your doctor or nurse if you plan to have children. Ask for information on sperm or egg banking.   Lenalidomide (Revlimid)  About This Drug Lenalidomide is used to treat cancer. It is given orally (by mouth).  Possible Side Effects  Bone marrow suppression. This is a decrease in the number of white blood cells, red blood cells, and platelets. This may raise your risk of infection, make you tired and weak (fatigue), and raise your risk of bleeding.   Nausea   Diarrhea (loose bowel movements)   Constipation (unable to move bowels)   Inflammation of your stomach and/or intestines   Pain in your abdomen or back pain   Fever   Tiredness and weakness   Swelling of your legs, ankles and/or feet   Decreased appetite (decreased hunger)   Muscle cramps/spasms   Pain in your joints   Headache   Feeling dizzy   Tremor   Trouble sleeping   Nosebleed   Upper respiratory infection, bronchitis   Inflammation of the nasal passages and throat   Trouble breathing   Cough   Rash and itching  Note: Each of the side effects above was reported in 15% or greater of patients treated with lenalidomide. Not all possible side effects are included above.  Warnings and Precautions  Blood clots and events such as stroke and heart attack. A blood clot in your leg may cause your leg to swell, appear red and warm, and/or cause pain. A blood clot in your lungs may cause trouble breathing, pain when breathing, and/or chest pain.   Severe bone marrow suppression   Changes in your liver function, which may cause liver failure and be  life-threatening.   Tumor lysis syndrome: This drug may act on the cancer cells very quickly. This may affect how your kidneys work and can be life-threatening.   Changes in your thyroid function   Severe allergic skin reaction  which may be life-threatening. You may develop blisters on your skin that are filled with fluid or a severe red rash all over your body that may be painful.   This drug may raise your risk of getting a second cancer.   You may develop a syndrome called tumor flare reaction. You may have painful lymph nodes, enlarged spleen, fever, and a rash.   This drug may make it more difficult to collect your stem cells if a stem cell transplant is part of your treatment plan.   There is a rare increased risk of death in patients with chronic lymphocytic leukemia and a risk of early death (dying sooner) in patient with mantle cell lymphoma.   Allergic reactions, including anaphylaxis are rare but may happen in some patients. Signs of allergic reaction to this drug may be swelling of the face, feeling like your tongue or throat are swelling, trouble breathing, rash, itching, fever, chills, feeling dizzy, and/or feeling that your heart is beating in a fast or not normal way. If this happens, do not take another dose of this drug. You should get urgent medical treatment.  Note: Some of the side effects above are very rare. If you have concerns and/or questions, please discuss them with your medical team.  Important Information  You will need to sign up for a special program called Revlimid REMS when you start taking this drug. Your nurse will help you get started.   Two negative pregnancy tests are required in women of childbearing potential prior to starting treatment. Routine pregnancy tests are required during treatment.   Do not donate blood during your treatment and for 4 weeks after your treatment.   Men should not donate sperm during your treatment and for 4 weeks after your  treatment because this drug is present in semen and may badly harm a baby.   How to Take Your Medication  Swallow the medicine whole with water, with or without food. Do not chew, break, or open it.   Take this medicine at about the same time each day   Missed dose: If you miss a dose, take it as soon as you think about it ONLY if it has been less than 12 hours since you normally take the missed dose. If it has been more than 12 hours, skip the missed dose and contact your physician. Take your next dose at the regular time. Do not take 2 doses at the same time and do not double up on the next dose.   If you vomit a dose, take your next dose at the regular time.   Handling: Wash your hands after handling your medicine, your caretakers should not handle your medicine with bare hands and should wear latex gloves.   If you get any of the content of a broken capsules on your skin, you should wash the area of the skin well with soap and water right away. Call your doctor if you get a skin reaction.   This drug may be present in the saliva, tears, sweat, urine, stool, vomit, semen, and vaginal secretions. Talk to your doctor and/or your nurse about the necessary precautions to take during this time.   Storage: Store this medicine in the original container at room temperature.   Disposal of unused medicine: Do not flush any expired and/or unused medicine down the toilet or drain unless you are specifically instructed to do so on the medication label. Some facilities have take-back programs and/or other options. If  you do not have a take-back program in your area, then please discuss with your nurse or your doctor how to dispose of unused medicine.  Treating Side Effects  Manage tiredness by pacing your activities for the day.   Be sure to include periods of rest between energy-draining activities.   If you are dizzy, get up slowly after sitting or lying.   To decrease the risk of infection,  wash your hands regularly.   Avoid close contact with people who have a cold, the flu, or other infections.   Take your temperature as your doctor or nurse tells you, and whenever you feel like you may have a fever.   To help decrease the risk of bleeding, use a soft toothbrush. Check with your nurse before using dental floss.   Be very careful when using knives or tools.   Use an electric shaver instead of a razor.   Ask your doctor or nurse about medicines that are available to help stop or lessen constipation and/or diarrhea.   If you are not able to move your bowels, check with your doctor or nurse before you use enemas, laxatives, or suppositories.   Drink plenty of fluids (a minimum of eight glasses per day is recommended).   Drink fluids that contribute calories (whole milk, juice, soft drinks, sweetened beverages, milkshakes, and nutritional supplements) instead of water.   If you throw up or have loose bowel movements, you should drink more fluids so that you do not become dehydrated (lack of water in the body from losing too much fluid).   If you have diarrhea, eat low-fiber foods that are high in protein and calories and avoid foods that can irritate your digestive tracts or lead to cramping.   To help with nausea and vomiting, eat small, frequent meals instead of three large meals a day.  Choose foods and drinks that are at room temperature. Ask your nurse or doctor about other helpful tips and medicine that is available to help stop or lessen these symptoms.   To help with decreased appetite, eat small, frequent meals. Eat foods high in calories and protein, such as meat, poultry, fish, dry beans, tofu, eggs, nuts, milk, yogurt, cheese, ice cream, pudding, and nutritional supplements.   Consider using sauces and spices to increase taste. Daily exercise, with your doctor's approval, may increase your appetite.   If you get a rash, do not put anything on it unless your  doctor or nurse says you may. Keep the area around the rash clean and dry. Ask your doctor for medicine if your rash bothers you.   Keeping your pain under control is important to your well-being. Please tell your doctor or nurse if you are experiencing pain.   If you are having trouble sleeping, talk to your nurse or doctor on tips to help you sleep better.   If you have a nosebleed, sit with your head tipped slightly forward. Apply pressure by lightly pinching the bridge of your nose between your thumb and forefinger. Call your doctor if you feel dizzy or faint or if the bleeding does not stop after 10 to 15 minutes.   Moisturize your skin several times a day.   Avoid sun exposure and apply sunscreen routinely when outdoors.  Food and Drug Interactions   There are no known interactions of lenalidomide with food.   Check with your doctor or pharmacist about all other prescription medicines and over-the-counter medicines and dietary supplements (vitamins, minerals, herbs, and  others) you are taking before starting this medicine as there are known drug interactions with lenalidomide. Also, check with your doctor or pharmacist before starting any new prescription or over-the-counter medicines, or dietary supplements to make sure that there are no interactions.   There are known interactions of lenalidomide with blood-thinning medicine such as warfarin. Ask your doctor what precautions you should take.  When to Call the Doctor Call your doctor or nurse if you have any of these symptoms and/or any new or unusual symptoms:  Fever of 100.4 F (38 C) or higher   Chills   Tiredness that interferes with your daily activities   Feeling dizzy or lightheaded   Easy bleeding or bruising   Your leg or arm is swollen, red, warm, and/or painful   Headache that does not go away   Nosebleed that does not stop bleeding after 10-15 minutes   Painful lymph nodes   Wheezing and/or trouble  breathing   Chest pain or symptoms of a heart attack. Most heart attacks involve pain in the center of the chest that lasts more than a few minutes. The pain may go away and come back. It can feel like pressure, squeezing, fullness, or pain. Sometimes pain is felt in one or both arms, the back, neck, jaw, or stomach. If any of these symptoms last 2 minutes, call 911.   Symptoms of a stroke such as sudden numbness or weakness of your face, arm, or leg, mostly on one side of your body; sudden confusion, trouble speaking or understanding; sudden trouble seeing in one or both eyes; sudden trouble walking, feeling dizzy, loss of balance or coordination; or sudden, bad headache with no known cause. If you have any of these symptoms for 2 minutes, call 911.   Signs of allergic reaction: swelling of the face, feeling like your tongue or throat are swelling, trouble breathing, rash, itching, fever, chills, feeling dizzy, and/or feeling that your heart is beating in a fast or not normal way. If this happens, call 911 for emergency care.   Coughing up yellow, green, or bloody mucus   Feeling that your heart is beating in a fast or not normal way (palpitations)   Nausea that stops you from eating or drinking and/or is not relieved by prescribed medicines   Throwing up more than 3 times a day   Loose bowel movements (diarrhea) 4 times a day or loose bowel movements with lack of strength or a feeling of being dizzy   No bowel movement in 3 days or when you feel uncomfortable   Trouble falling or staying asleep   Pain in your abdomen that does not go away   Weight gain of 5 pounds in one week (fluid retention)   Swelling of your legs, ankles and/or feet   Unexplained weight gain   Lasting loss of appetite or rapid weight loss of five pounds in a week   Pain that does not go away, or is not relieved by prescribed medicines   Flu-like symptoms: fever, headache, muscle and joint aches, and fatigue  (low energy, feeling weak)   A new rash or itching that is not relieved by prescribed medicines   Signs of possible liver problems: dark urine, pale bowel movements, bad stomach pain, feeling very tired and weak, unusual itching, or yellowing of the eyes or skin   Signs of tumor lysis: confusion or agitation, decreased urine, nausea/vomiting, diarrhea, muscle cramping, numbness and/or tingling, seizures   If you think you  may be pregnant or may have impregnated your partner  Reproduction Warnings   Pregnancy warning: This drug can have harmful effects on the unborn baby. Women of childbearing potential must commit to abstain from heterosexual intercourse or use 2 effective methods of birth control, one of which, must be a highly effective method of birth control, beginning at least 4 weeks before treatment starts, during your cancer treatment, including dose interruptions, and for at least 4 weeks after treatment. A highly effective method of birth control includes tubal ligation, intrauterine device (IUD), hormonal (birth control pills, injections, patch and/or implants) or a partner's vasectomy. Stop taking lenalidomide immediately and let your doctor know right away if you think you may be pregnant, miss your menstrual period, or experience unusual menstrual bleeding.   Men with female partners of childbearing potential should use effective methods of birth control during your cancer treatment and for at least 4 weeks after your cancer treatment.    Breastfeeding warning: Women should not breastfeed during treatment because this drug could enter the breast milk and cause harm to a breastfeeding baby.   Fertility warning: Human fertility studies have not been done with this drug. Talk with your doctor or nurse if you plan to have children. Ask for information on sperm or egg banking.   Dexamethasone (Decadron)  About This Drug  Dexamethasone is used to treat cancer, to decrease  inflammation and sometimes used before and after chemotherapy to prevent or treat nausea and/or vomiting. It is given in the vein (IV) or orally (by mouth).  Possible Side Effects   Headache   High blood pressure   Abnormal heart beat   Tiredness and weakness   Changes in mood, which may include depression or a feeling of extreme well-being   Trouble sleeping   Increased sweating   Increased appetite (increased hunger)   Weight gain   Increase risk of infections   Pain in your abdomen   Nausea   Skin changes such as rash, dryness, redness   Blood sugar levels may change   Electrolyte changes   Swelling of your legs, ankles and/or feet   Changes in your liver function   You may be at risk for cataracts, glaucoma or infections of the eye   Muscle loss and / or weakness (lack of muscle strength)   Increased risk of developing osteoporosis- your bones may become weak and brittle  Note: Not all possible side effects are included above.  Warnings and Precautions   This drug may cause you to feel irritable, nervous or restless.   Allergic reactions, including anaphylaxis are rare but may happen in some patients. Signs of allergic reaction to this drug may be swelling of the face, feeling like your tongue or throat are swelling, trouble breathing, rash, itching, fever, chills, feeling dizzy, and/or feeling that your heart is beating in a fast or not normal way. If this happens, do not take another dose of this drug. You should get urgent medical treatment.   High blood pressure and changes in electrolytes, which can cause fluid build-up around your heart, lungs or elsewhere.   Increased risk of developing a hole in your stomach, small, and/or large intestine if you have ulcers in the lining of your stomach and/or intestine, or have diverticulitis, ulcerative colitis and/or other diseases that affect the gastrointestinal tract.   Effects on the endocrine glands  including the pituitary, adrenals or thyroid during or after use of this medication.   Changes in the  tissue of the heart, that can cause your heart to have less ability to pump blood. You may be short of breath or our arms, hands, legs and feet may swell.   Increased risk of heart attack.   Severe depression and other psychiatric disorders such as mood changes.   Burning, pain and itching around your anus may happen when this drug is given in the vein too rapidly (IV). It usually happens suddenly and resolves in less than 1 minute.  Important Information   Talk to your doctor or your nurse before stopping this medication, it should be stopped gradually. Depending on the dose and length of treatment, you could experience serious side effects if stopped abruptly (suddenly).   Talk to your doctor before receiving any vaccinations during your treatment. Some vaccinations are not recommended while receiving dexamethasone.  How to Take Your Medication   For Oral (by mouth): You can take the medicine with or without food. If you have nausea or upset stomach, take it with food.   Missed dose: If you miss a dose, do not take 2 doses at the same time or extra doses.  If you vomit a dose, take your next dose at the regular time. Do not take 2 doses at the same time   Handling: Wash your hands after handling your medicine, your caretakers should not handle your medicine with bare hands and should wear latex gloves.   Storage: Store this medicine in the original container at room temperature. Protect from moisture and light. Discuss with your nurse or your doctor how to dispose of unused medicine.   Treating Side Effects   Drink plenty of fluids (a minimum of eight glasses per day is recommended).   To help with nausea and vomiting, eat small, frequent meals instead of three large meals a day. Choose foods and drinks that are at room temperature. Ask your nurse or doctor about other helpful tips  and medicine that is available to help stop or lessen these symptoms.   If you throw up, you should drink more fluids so that you do not become dehydrated (lack of water in the body from losing too much fluid).   Manage tiredness by pacing your activities for the day.   Be sure to include periods of rest between energy-draining activities.   To help with muscle weakness, get regular exercise. If you feel too tired to exercise vigorously, try taking a short walk.   If you are having trouble sleeping, talk to your nurse or doctor on tips to help you sleep better.   If you are feeling depressed, talk to your nurse or doctor about it.   Keeping your pain under control is important to your well-being. Please tell your doctor or nurse if you are experiencing pain.   If you have diabetes, keep good control of your blood sugar level. Tell your nurse or your doctor if your glucose levels are higher or lower than normal.   To decrease the risk of infection, wash your hands regularly.   Avoid close contact with people who have a cold, the flu, or other infections.   Take your temperature as your doctor or nurse tells you, and whenever you feel like you may have a fever.   If you get a rash do not put anything on it unless your doctor or nurse says you may. Keep the area around the rash clean and dry. Ask your doctor for medicine if your rash bothers you.  Moisturize your skin several times day.   Avoid sun exposure and apply sunscreen routinely when outdoors.  Food and Drug Interactions   There are no known interactions of dexamethasone with food.   Check with your doctor or pharmacist about all other prescription medicines and over-the-counter medicines and dietary supplements (vitamins, minerals, herbs and others) you are taking before starting this medicine as there are known drug interactions with dexamethasone. Also, check with your doctor or pharmacist before starting any new  prescription or over-the-counter medicines, or dietary supplement to make sure that there are no interactions.   There are known interactions of dexamethasone with other medicines and products like acetaminophen, aspirin, and ibuprofen. Ask your doctor what over-the-counter (OTC) medicines you can take.  When to Call the Doctor  Call your doctor or nurse if you have any of these symptoms and/or any new or unusual symptoms:   Fever of 100.4 F (38 C) or higher   Chills   A headache that does not go away   Trouble breathing   Blurry vision or other changes in eyesight   Feel irritable, nervous or restless   Trouble falling or staying asleep   Severe mood changes such as depression or unusual thoughts and/or behaviors   Thoughts of hurting yourself or others, and suicide   Tiredness that interferes with your daily activities   Feeling that your heart is beating in a fast, slow or not normal way   Feeling dizzy or lightheaded   Chest pain or symptoms of a heart attack. Most heart attacks involve pain in the center of the chest that lasts more than a few minutes. The pain may go away and come back, or it can be constant. It can feel like pressure, squeezing, fullness, or pain. Sometimes pain is felt in one or both arms, the back, neck, jaw, or stomach. If any of these symptoms last 2 minutes, call 911.   Heartburn or indigestion   Nausea that stops you from eating or drinking and/or is not relieved by prescribed medicines   Throwing up more than 3 times a day   Pain in your abdomen that does not go away   Abnormal blood sugar   Unusual thirst, passing urine often, headache, sweating, shakiness, irritability   Swelling of legs, ankles, or feet   Weight gain of 5 pounds in one week (fluid retention)   Signs of possible liver problems: dark urine, pale bowel movements, bad stomach pain, feeling very tired and weak, unusual itching, or yellowing of the eyes or skin    Severe muscle weakness   A new rash or a rash that is not relieved by prescribed medicines   Signs of allergic reaction: swelling of the face, feeling like your tongue or throat are swelling, trouble breathing, rash, itching, fever, chills, feeling dizzy, and/or feeling that your heart is beating in a fast or not normal way. If this happens, call 911 for emergency care.   If you think you may be pregnant  Reproduction Warnings   Pregnancy warning: It is not known if this drug may harm an unborn child. For this reason, be sure to talk with your doctor if you are pregnant or planning to become pregnant while receiving this drug. Let your doctor know right away if you think you may be pregnant or may have impregnated your partner.   Breastfeeding warning: It is not known if this drug passes into breast milk. For this reason, women should talk to their  doctor about the risks and benefits of breastfeeding during treatment with this drug because this drug may enter the breast milk and cause harm to a breastfeeding baby.   Fertility warning: Human fertility studies have not been done with this drug. Talk with your doctor or nurse if you plan to have children. Ask for information on sperm banking.    SELF CARE ACTIVITIES WHILE ON CHEMOTHERAPY/IMMUNOTHERAPY:  Hydration Increase your fluid intake 48 hours prior to treatment and drink at least 8 to 12 cups (64 ounces) of water/decaffeinated beverages per day after treatment. You can still have your cup of coffee or soda but these beverages do not count as part of your 8 to 12 cups that you need to drink daily. No alcohol intake.  Medications Continue taking your normal prescription medication as prescribed.  If you start any new herbal or new supplements please let us know first to make sure it is safe.  Mouth Care Have teeth cleaned professionally before starting treatment. Keep dentures and partial plates clean. Use soft toothbrush and do not use  mouthwashes that contain alcohol. Biotene is a good mouthwash that is available at most pharmacies or may be ordered by calling [phone]. Use warm salt water gargles (1 teaspoon salt per 1 quart warm water) before and after meals and at bedtime. Or you may rinse with 2 tablespoons of three-percent hydrogen peroxide mixed in eight ounces of water. If you are still having problems with your mouth or sores in your mouth please call the clinic. If you need dental work, please let the doctor know before you go for your appointment so that we can coordinate the best possible time for you in regards to your chemo regimen. You need to also let your dentist know that you are actively taking chemo. We may need to do labs prior to your dental appointment.  Skin Care Always use sunscreen that has not expired and with SPF (Sun Protection Factor) of 50 or higher. Wear hats to protect your head from the sun. Remember to use sunscreen on your hands, ears, face, & feet.  Use good moisturizing lotions such as udder cream, eucerin, or even Vaseline. Some chemotherapies can cause dry skin, color changes in your skin and nails.    Avoid long, hot showers or baths. Use gentle, fragrance-free soaps and laundry detergent. Use moisturizers, preferably creams or ointments rather than lotions because the thicker consistency is better at preventing skin dehydration. Apply the cream or ointment within 15 minutes of showering. Reapply moisturizer at night, and moisturize your hands every time after you wash them.   Infection Prevention Please wash your hands for at least 30 seconds using warm soapy water. Handwashing is the #1 way to prevent the spread of germs. Stay away from sick people or people who are getting over a cold. If you develop respiratory systems such as green/yellow mucus production or productive cough or persistent cough let us know and we will see if you need an antibiotic. It is a good idea to keep a pair of  gloves on when going into grocery stores/[employer] to decrease your risk of coming into contact with germs on the carts, etc. Carry alcohol hand gel with you at all times and use it frequently if out in public. If your temperature reaches 100.5 or higher please call the clinic and let us know.  If it is after hours or on the weekend please go to the ER if your temperature is over 100.4.  Please have your own personal thermometer at home to use.    Sex and bodily fluids If you are going to have sex, a condom must be used to protect the person that isn't taking immunotherapy. For a few days after treatment, immunotherapy can be excreted through your bodily fluids.  When using the toilet please close the lid and flush the toilet twice.  Do this for a few day after you have had immunotherapy.   Contraception It is not known for sure whether or not immunotherapy drugs can be passed on through semen or secretions from the vagina. Because of this some doctors advise people to use a barrier method if you have sex during treatment. This applies to vaginal, anal or oral sex.  Generally, doctors advise a barrier method only for the time you are actually having the treatment and for about a week after your treatment.  Advice like this can be worrying, but this does not mean that you have to avoid being intimate with your partner. You can still have close contact with your partner and continue to enjoy sex.  Animals If you have cats or birds we just ask that you not change the litter or change the cage.  Please have someone else do this for you while you are on immunotherapy.   Food Safety During and After Cancer Treatment Food safety is important for people both during and after cancer treatment. Cancer and cancer treatments, such as chemotherapy, radiation therapy, and stem cell/bone marrow transplantation, often weaken the immune system. This makes it harder for your body to protect itself from foodborne  illness, also called food poisoning. Foodborne illness is caused by eating food that contains harmful bacteria, parasites, or viruses.  Foods to avoid Some foods have a higher risk of becoming tainted with bacteria. These include: Unwashed fresh fruit and vegetables, especially leafy vegetables that can hide dirt and other contaminants Raw sprouts, such as alfalfa sprouts Raw or undercooked beef, especially ground beef, or other raw or undercooked meat and poultry Fatty, fried, or spicy foods immediately before or after treatment.  These can sit heavy on your stomach and make you feel nauseous. Raw or undercooked shellfish, such as oysters. Sushi and sashimi, which often contain raw fish.  Unpasteurized beverages, such as unpasteurized fruit juices, raw milk, raw yogurt, or cider Undercooked eggs, such as soft boiled, over easy, and poached; raw, unpasteurized eggs; or foods made with raw egg, such as homemade raw cookie dough and homemade mayonnaise  Simple steps for food safety  Shop smart. Do not buy food stored or displayed in an unclean area. Do not buy bruised or damaged fruits or vegetables. Do not buy cans that have cracks, dents, or bulges. Pick up foods that can spoil at the end of your shopping trip and store them in a cooler on the way home.  Prepare and clean up foods carefully. Rinse all fresh fruits and vegetables under running water, and dry them with a clean towel or paper towel. Clean the top of cans before opening them. After preparing food, wash your hands for 20 seconds with hot water and soap. Pay special attention to areas between fingers and under nails. Clean your utensils and dishes with hot water and soap. Disinfect your kitchen and cutting boards using 1 teaspoon of liquid, unscented bleach mixed into 1 quart of water.    Dispose of old food. Eat canned and packaged food before its expiration date (the "use by" or "best before"  date). Consume refrigerated  leftovers within 3 to 4 days. After that time, throw out the food. Even if the food does not smell or look spoiled, it still may be unsafe. Some bacteria, such as Listeria, can grow even on foods stored in the refrigerator if they are kept for too long.  Take precautions when eating out. At restaurants, avoid buffets and salad bars where food sits out for a long time and comes in contact with many people. Food can become contaminated when someone with a virus, often a norovirus, or another "bug" handles it. Put any leftover food in a "to-go" container yourself, rather than having the server do it. And, refrigerate leftovers as soon as you get home. Choose restaurants that are clean and that are willing to prepare your food as you order it cooked.    SYMPTOMS TO REPORT AS SOON AS POSSIBLE AFTER TREATMENT:  FEVER GREATER THAN 100.4 F CHILLS WITH OR WITHOUT FEVER NAUSEA AND VOMITING THAT IS NOT CONTROLLED WITH YOUR NAUSEA MEDICATION UNUSUAL SHORTNESS OF BREATH UNUSUAL BRUISING OR BLEEDING TENDERNESS IN MOUTH AND THROAT WITH OR WITHOUT PRESENCE OF ULCERS URINARY PROBLEMS BOWEL PROBLEMS UNUSUAL RASH     Wear comfortable clothing and clothing appropriate for easy access to any Portacath or PICC line. Let us know if there is anything that we can do to make your therapy better!   What to do if you need assistance after hours or on the weekends: CALL [phone].  HOLD on the line, do not hang up.  You will hear multiple messages but at the end you will be connected with a nurse triage line.  They will contact the doctor if necessary.  Most of the time they will be able to assist you.  Do not call the hospital operator.    I have been informed and understand all of the instructions given to me and have received a copy. I have been instructed to call the clinic [phone] or my family physician as soon as possible for continued medical care, if indicated. I do not have any more questions at  this time but understand that I may call the [facility] or the Patient Navigator at [phone] during office hours should I have questions or need assistance in obtaining follow-up care.

## 2021-11-23 ENCOUNTER — Other Ambulatory Visit: Payer: Self-pay

## 2021-11-23 DIAGNOSIS — D509 Iron deficiency anemia, unspecified: Secondary | ICD-10-CM | POA: Diagnosis not present

## 2021-11-23 DIAGNOSIS — G4700 Insomnia, unspecified: Secondary | ICD-10-CM | POA: Diagnosis not present

## 2021-11-23 DIAGNOSIS — I10 Essential (primary) hypertension: Secondary | ICD-10-CM | POA: Diagnosis not present

## 2021-11-23 DIAGNOSIS — D473 Essential (hemorrhagic) thrombocythemia: Secondary | ICD-10-CM | POA: Diagnosis not present

## 2021-11-24 ENCOUNTER — Other Ambulatory Visit: Payer: Self-pay

## 2021-11-24 DIAGNOSIS — C9000 Multiple myeloma not having achieved remission: Secondary | ICD-10-CM

## 2021-11-25 ENCOUNTER — Inpatient Hospital Stay: Payer: Medicare Other

## 2021-11-25 ENCOUNTER — Other Ambulatory Visit: Payer: Self-pay

## 2021-11-25 ENCOUNTER — Inpatient Hospital Stay: Payer: Medicare Other | Attending: Hematology

## 2021-11-25 DIAGNOSIS — N189 Chronic kidney disease, unspecified: Secondary | ICD-10-CM | POA: Diagnosis not present

## 2021-11-25 DIAGNOSIS — E876 Hypokalemia: Secondary | ICD-10-CM

## 2021-11-25 DIAGNOSIS — Z79899 Other long term (current) drug therapy: Secondary | ICD-10-CM | POA: Insufficient documentation

## 2021-11-25 DIAGNOSIS — Z79624 Long term (current) use of inhibitors of nucleotide synthesis: Secondary | ICD-10-CM | POA: Diagnosis not present

## 2021-11-25 DIAGNOSIS — Z7961 Long term (current) use of immunomodulator: Secondary | ICD-10-CM | POA: Insufficient documentation

## 2021-11-25 DIAGNOSIS — D696 Thrombocytopenia, unspecified: Secondary | ICD-10-CM | POA: Insufficient documentation

## 2021-11-25 DIAGNOSIS — Z5112 Encounter for antineoplastic immunotherapy: Secondary | ICD-10-CM | POA: Diagnosis not present

## 2021-11-25 DIAGNOSIS — D509 Iron deficiency anemia, unspecified: Secondary | ICD-10-CM | POA: Diagnosis not present

## 2021-11-25 DIAGNOSIS — D631 Anemia in chronic kidney disease: Secondary | ICD-10-CM | POA: Insufficient documentation

## 2021-11-25 DIAGNOSIS — I129 Hypertensive chronic kidney disease with stage 1 through stage 4 chronic kidney disease, or unspecified chronic kidney disease: Secondary | ICD-10-CM | POA: Diagnosis not present

## 2021-11-25 DIAGNOSIS — D75839 Thrombocytosis, unspecified: Secondary | ICD-10-CM | POA: Insufficient documentation

## 2021-11-25 DIAGNOSIS — C9000 Multiple myeloma not having achieved remission: Secondary | ICD-10-CM

## 2021-11-25 DIAGNOSIS — Z9221 Personal history of antineoplastic chemotherapy: Secondary | ICD-10-CM | POA: Diagnosis not present

## 2021-11-25 DIAGNOSIS — Z8572 Personal history of non-Hodgkin lymphomas: Secondary | ICD-10-CM | POA: Insufficient documentation

## 2021-11-25 DIAGNOSIS — E7800 Pure hypercholesterolemia, unspecified: Secondary | ICD-10-CM | POA: Diagnosis not present

## 2021-11-25 DIAGNOSIS — R42 Dizziness and giddiness: Secondary | ICD-10-CM | POA: Insufficient documentation

## 2021-11-25 LAB — CBC WITH DIFFERENTIAL/PLATELET
Abs Immature Granulocytes: 0.03 10*3/uL (ref 0.00–0.07)
Basophils Absolute: 0 10*3/uL (ref 0.0–0.1)
Basophils Relative: 0 %
Eosinophils Absolute: 0 10*3/uL (ref 0.0–0.5)
Eosinophils Relative: 0 %
HCT: 26.9 % — ABNORMAL LOW (ref 36.0–46.0)
Hemoglobin: 8.6 g/dL — ABNORMAL LOW (ref 12.0–15.0)
Immature Granulocytes: 1 %
Lymphocytes Relative: 36 %
Lymphs Abs: 2.4 10*3/uL (ref 0.7–4.0)
MCH: 31.9 pg (ref 26.0–34.0)
MCHC: 32 g/dL (ref 30.0–36.0)
MCV: 99.6 fL (ref 80.0–100.0)
Monocytes Absolute: 0.8 10*3/uL (ref 0.1–1.0)
Monocytes Relative: 12 %
Neutro Abs: 3.5 10*3/uL (ref 1.7–7.7)
Neutrophils Relative %: 51 %
Platelets: 326 10*3/uL (ref 150–400)
RBC: 2.7 MIL/uL — ABNORMAL LOW (ref 3.87–5.11)
RDW: 21.9 % — ABNORMAL HIGH (ref 11.5–15.5)
WBC: 6.6 10*3/uL (ref 4.0–10.5)
nRBC: 0.6 % — ABNORMAL HIGH (ref 0.0–0.2)

## 2021-11-25 LAB — COMPREHENSIVE METABOLIC PANEL
ALT: 13 U/L (ref 0–44)
AST: 14 U/L — ABNORMAL LOW (ref 15–41)
Albumin: 2.8 g/dL — ABNORMAL LOW (ref 3.5–5.0)
Alkaline Phosphatase: 50 U/L (ref 38–126)
Anion gap: 5 (ref 5–15)
BUN: 14 mg/dL (ref 8–23)
CO2: 29 mmol/L (ref 22–32)
Calcium: 8.6 mg/dL — ABNORMAL LOW (ref 8.9–10.3)
Chloride: 105 mmol/L (ref 98–111)
Creatinine, Ser: 1.13 mg/dL — ABNORMAL HIGH (ref 0.44–1.00)
GFR, Estimated: 47 mL/min — ABNORMAL LOW (ref >60)
Glucose, Bld: 135 mg/dL — ABNORMAL HIGH (ref 70–99)
Potassium: 2.5 mmol/L — CL (ref 3.5–5.1)
Sodium: 139 mmol/L (ref 135–145)
Total Bilirubin: 0.5 mg/dL (ref 0.3–1.2)
Total Protein: 7.8 g/dL (ref 6.5–8.1)

## 2021-11-25 LAB — TYPE AND SCREEN
ABO/RH(D): O POS
Antibody Screen: NEGATIVE

## 2021-11-25 LAB — MAGNESIUM: Magnesium: 1.8 mg/dL (ref 1.7–2.4)

## 2021-11-25 MED ORDER — SODIUM CHLORIDE 0.9% FLUSH
10.0000 mL | Freq: Once | INTRAVENOUS | Status: AC
  Administered 2021-11-25: 10 mL

## 2021-11-25 MED ORDER — POTASSIUM CHLORIDE CRYS ER 20 MEQ PO TBCR: EXTENDED_RELEASE_TABLET | ORAL | 0 refills | Status: DC

## 2021-11-25 MED ORDER — PROCHLORPERAZINE MALEATE 5 MG PO TABS: 5.0000 mg | ORAL_TABLET | Freq: Four times a day (QID) | ORAL | 6 refills | Status: DC | PRN

## 2021-11-25 MED ORDER — HEPARIN SOD (PORK) LOCK FLUSH 100 UNIT/ML IV SOLN
500.0000 [IU] | Freq: Once | INTRAVENOUS | Status: AC
  Administered 2021-11-25: 500 [IU] via INTRAVENOUS

## 2021-11-25 NOTE — Progress Notes (Signed)
Chemotherapy/immunotherapy education packet given and discussed with pt and family in detail.  Discussed diagnosis and staging, tx regimen, and intent of tx.  Reviewed chemotherapy/immunotherapy medications and side effects, as well as pre-medications.  Instructed on how to manage side effects at home, and when to call the clinic.  Importance of fever/chills discussed with pt and family. Discussed precautions to implement at home after receiving tx, as well as self care strategies. Phone numbers provided for clinic during regular working hours, also how to reach the clinic after hours and on weekends. Pt and family provided the opportunity to ask questions - all questions answered to pt's and family satisfaction.  °

## 2021-11-25 NOTE — Progress Notes (Signed)
Notifed by B. Presnell, RN of critical K+ of 2.5. Orders rec'd from Dr. Delton Coombes to prescribe potassium 40 mEq po q 4 hrs x 2 today, then 40 mEq x 1 in the morning tomorrow. We will recheck K+ tomorrow afternoon at 1230. Rx sent to [facility] and pt notified of above. She verbalizes understanding of how to take the medication and is aware to be here in clinic tomorrow for port flush/lab at 1230.

## 2021-11-25 NOTE — Progress Notes (Unsigned)
CRITICAL VALUE ALERT Critical value received:  Potassium 2.5.  Date of notification:  11-25-2021 Time of notification: 14:56 pm Critical value read back:  Yes.   Nurse who received alert:  B. Cira Deyoe RN MD notified time and response:  Dr. Delton Coombes @ 15:04pm.

## 2021-11-25 NOTE — Addendum Note (Signed)
Addended by: Joie Bimler on: 11/25/2021 03:53 PM   Modules accepted: Orders

## 2021-11-25 NOTE — Progress Notes (Signed)
Pharmacist Chemotherapy Monitoring - Initial Assessment    Anticipated start date: 11/30/21   The following has been reviewed per standard work regarding the patient's treatment regimen: The patient's diagnosis, treatment plan and drug doses, and organ/hematologic function Lab orders and baseline tests specific to treatment regimen  The treatment plan start date, drug sequencing, and pre-medications Prior authorization status  Patient's documented medication list, including drug-drug interaction screen and prescriptions for anti-emetics and supportive care specific to the treatment regimen The drug concentrations, fluid compatibility, administration routes, and timing of the medications to be used The patient's access for treatment and lifetime cumulative dose history, if applicable  The patient's medication allergies and previous infusion related reactions, if applicable   Changes made to treatment plan:  N/A  Follow up needed:  Follow up on T&S and Phenotype day of treatment.   Wynona Neat, [facility], 11/25/2021  11:32 AM

## 2021-11-25 NOTE — Addendum Note (Signed)
Addended by: Joie Bimler on: 11/25/2021 03:49 PM   Modules accepted: Orders

## 2021-11-26 ENCOUNTER — Inpatient Hospital Stay: Payer: Medicare Other

## 2021-11-26 ENCOUNTER — Other Ambulatory Visit: Payer: Self-pay

## 2021-11-26 DIAGNOSIS — E876 Hypokalemia: Secondary | ICD-10-CM

## 2021-11-26 DIAGNOSIS — C9000 Multiple myeloma not having achieved remission: Secondary | ICD-10-CM | POA: Diagnosis not present

## 2021-11-26 LAB — POTASSIUM: Potassium: 3.9 mmol/L (ref 3.5–5.1)

## 2021-11-26 MED ORDER — SODIUM CHLORIDE 0.9% FLUSH
10.0000 mL | INTRAVENOUS | Status: AC
  Administered 2021-11-26: 10 mL

## 2021-11-26 MED ORDER — HEPARIN SOD (PORK) LOCK FLUSH 100 UNIT/ML IV SOLN
500.0000 [IU] | Freq: Once | INTRAVENOUS | Status: AC
  Administered 2021-11-26: 500 [IU] via INTRAVENOUS

## 2021-11-26 NOTE — Progress Notes (Signed)
Leslie Williamson presented for Portacath access and flush with lab draw. Portacath located Right chest wall accessed with  H 20 needle.  Good blood return present. Portacath flushed with 13m NS and 500U/573mHeparin and needle removed intact.  Procedure tolerated well and without incident.    Discharged from clinic ambulatory in stable condition. Alert and oriented x 3. [facility]

## 2021-11-26 NOTE — Patient Instructions (Signed)
[facility]  Discharge Instructions: Thank you for choosing [facility] to provide your oncology and hematology care.  If you have a lab appointment with the [facility], please come in thru the Main Entrance and check in at the main information desk.  Wear comfortable clothing and clothing appropriate for easy access to any Portacath or PICC line.   We strive to give you quality time with your provider. You may need to reschedule your appointment if you arrive late (15 or more minutes).  Arriving late affects you and other patients whose appointments are after yours.  Also, if you miss three or more appointments without notifying the office, you may be dismissed from the clinic at the provider's discretion.      For prescription refill requests, have your pharmacy contact our office and allow 72 hours for refills to be completed.    Today you received port flush with lab draw.       To help prevent nausea and vomiting after your treatment, we encourage you to take your nausea medication as directed.  BELOW ARE SYMPTOMS THAT SHOULD BE REPORTED IMMEDIATELY: *FEVER GREATER THAN 100.4 F (38 C) OR HIGHER *CHILLS OR SWEATING *NAUSEA AND VOMITING THAT IS NOT CONTROLLED WITH YOUR NAUSEA MEDICATION *UNUSUAL SHORTNESS OF BREATH *UNUSUAL BRUISING OR BLEEDING *URINARY PROBLEMS (pain or burning when urinating, or frequent urination) *BOWEL PROBLEMS (unusual diarrhea, constipation, pain near the anus) TENDERNESS IN MOUTH AND THROAT WITH OR WITHOUT PRESENCE OF ULCERS (sore throat, sores in mouth, or a toothache) UNUSUAL RASH, SWELLING OR PAIN  UNUSUAL VAGINAL DISCHARGE OR ITCHING   Items with * indicate a potential emergency and should be followed up as soon as possible or go to the Emergency Department if any problems should occur.  Please show the CHEMOTHERAPY ALERT CARD or IMMUNOTHERAPY ALERT CARD at check-in to the Emergency Department and triage  nurse.  Should you have questions after your visit or need to cancel or reschedule your appointment, please contact [name] [phone]  and follow the prompts.  Office hours are 8:00 a.m. to 4:30 p.m. Monday - Friday. Please note that voicemails left after 4:00 p.m. may not be returned until the following business day.  We are closed weekends and major holidays. You have access to a nurse at all times for urgent questions. Please call the main number to the clinic [phone] and follow the prompts.  For any non-urgent questions, you may also contact your provider using MyChart. We now offer e-Visits for anyone 91 and older to request care online for non-urgent symptoms. For details visit [email or web address].   Also download the MyChart app! Go to the app store, search "MyChart", open the app, select [facility], and log in with your MyChart username and password.  Masks are optional in the cancer centers. If you would like for your care team to wear a mask while they are taking care of you, please let them know. You may have one support person who is at least 86 years old accompany you for your appointments.

## 2021-11-27 ENCOUNTER — Other Ambulatory Visit: Payer: Self-pay

## 2021-11-28 ENCOUNTER — Other Ambulatory Visit: Payer: Self-pay

## 2021-11-30 ENCOUNTER — Inpatient Hospital Stay: Payer: Medicare Other

## 2021-11-30 VITALS — BP 128/69 | HR 84 | Temp 97.9°F | Resp 18

## 2021-11-30 VITALS — BP 150/71 | HR 99 | Temp 97.7°F | Resp 18 | Wt 155.4 lb

## 2021-11-30 DIAGNOSIS — C9000 Multiple myeloma not having achieved remission: Secondary | ICD-10-CM

## 2021-11-30 DIAGNOSIS — Z5112 Encounter for antineoplastic immunotherapy: Secondary | ICD-10-CM | POA: Diagnosis not present

## 2021-11-30 DIAGNOSIS — E7800 Pure hypercholesterolemia, unspecified: Secondary | ICD-10-CM | POA: Diagnosis not present

## 2021-11-30 DIAGNOSIS — D75839 Thrombocytosis, unspecified: Secondary | ICD-10-CM | POA: Diagnosis not present

## 2021-11-30 DIAGNOSIS — Z79899 Other long term (current) drug therapy: Secondary | ICD-10-CM | POA: Diagnosis not present

## 2021-11-30 DIAGNOSIS — Z9221 Personal history of antineoplastic chemotherapy: Secondary | ICD-10-CM | POA: Diagnosis not present

## 2021-11-30 DIAGNOSIS — R42 Dizziness and giddiness: Secondary | ICD-10-CM | POA: Diagnosis not present

## 2021-11-30 DIAGNOSIS — N189 Chronic kidney disease, unspecified: Secondary | ICD-10-CM | POA: Diagnosis not present

## 2021-11-30 DIAGNOSIS — Z7961 Long term (current) use of immunomodulator: Secondary | ICD-10-CM | POA: Diagnosis not present

## 2021-11-30 DIAGNOSIS — Z79624 Long term (current) use of inhibitors of nucleotide synthesis: Secondary | ICD-10-CM | POA: Diagnosis not present

## 2021-11-30 DIAGNOSIS — D631 Anemia in chronic kidney disease: Secondary | ICD-10-CM | POA: Diagnosis not present

## 2021-11-30 DIAGNOSIS — D696 Thrombocytopenia, unspecified: Secondary | ICD-10-CM | POA: Diagnosis not present

## 2021-11-30 DIAGNOSIS — I129 Hypertensive chronic kidney disease with stage 1 through stage 4 chronic kidney disease, or unspecified chronic kidney disease: Secondary | ICD-10-CM | POA: Diagnosis not present

## 2021-11-30 DIAGNOSIS — Z8572 Personal history of non-Hodgkin lymphomas: Secondary | ICD-10-CM | POA: Diagnosis not present

## 2021-11-30 DIAGNOSIS — E876 Hypokalemia: Secondary | ICD-10-CM | POA: Diagnosis not present

## 2021-11-30 DIAGNOSIS — D509 Iron deficiency anemia, unspecified: Secondary | ICD-10-CM | POA: Diagnosis not present

## 2021-11-30 LAB — COMPREHENSIVE METABOLIC PANEL
ALT: 10 U/L (ref 0–44)
AST: 15 U/L (ref 15–41)
Albumin: 2.9 g/dL — ABNORMAL LOW (ref 3.5–5.0)
Alkaline Phosphatase: 57 U/L (ref 38–126)
Anion gap: 5 (ref 5–15)
BUN: 11 mg/dL (ref 8–23)
CO2: 25 mmol/L (ref 22–32)
Calcium: 8.7 mg/dL — ABNORMAL LOW (ref 8.9–10.3)
Chloride: 109 mmol/L (ref 98–111)
Creatinine, Ser: 0.95 mg/dL (ref 0.44–1.00)
GFR, Estimated: 58 mL/min — ABNORMAL LOW (ref >60)
Glucose, Bld: 127 mg/dL — ABNORMAL HIGH (ref 70–99)
Potassium: 3.1 mmol/L — ABNORMAL LOW (ref 3.5–5.1)
Sodium: 139 mmol/L (ref 135–145)
Total Bilirubin: 0.6 mg/dL (ref 0.3–1.2)
Total Protein: 7.9 g/dL (ref 6.5–8.1)

## 2021-11-30 LAB — CBC WITH DIFFERENTIAL/PLATELET
Abs Immature Granulocytes: 0.01 10*3/uL (ref 0.00–0.07)
Basophils Absolute: 0 10*3/uL (ref 0.0–0.1)
Basophils Relative: 0 %
Eosinophils Absolute: 0 10*3/uL (ref 0.0–0.5)
Eosinophils Relative: 0 %
HCT: 27.8 % — ABNORMAL LOW (ref 36.0–46.0)
Hemoglobin: 8.9 g/dL — ABNORMAL LOW (ref 12.0–15.0)
Immature Granulocytes: 0 %
Lymphocytes Relative: 45 %
Lymphs Abs: 3 10*3/uL (ref 0.7–4.0)
MCH: 31.9 pg (ref 26.0–34.0)
MCHC: 32 g/dL (ref 30.0–36.0)
MCV: 99.6 fL (ref 80.0–100.0)
Monocytes Absolute: 1 10*3/uL (ref 0.1–1.0)
Monocytes Relative: 14 %
Neutro Abs: 2.8 10*3/uL (ref 1.7–7.7)
Neutrophils Relative %: 41 %
Platelets: 299 10*3/uL (ref 150–400)
RBC: 2.79 MIL/uL — ABNORMAL LOW (ref 3.87–5.11)
RDW: 22.2 % — ABNORMAL HIGH (ref 11.5–15.5)
WBC: 6.8 10*3/uL (ref 4.0–10.5)
nRBC: 0.4 % — ABNORMAL HIGH (ref 0.0–0.2)

## 2021-11-30 LAB — PRETREATMENT RBC PHENOTYPE

## 2021-11-30 LAB — MAGNESIUM: Magnesium: 2 mg/dL (ref 1.7–2.4)

## 2021-11-30 MED ORDER — SODIUM CHLORIDE 0.9 % IV SOLN: INTRAVENOUS | Status: DC

## 2021-11-30 MED ORDER — DEXAMETHASONE 4 MG PO TABS
20.0000 mg | ORAL_TABLET | Freq: Once | ORAL | Status: AC
  Administered 2021-11-30: 20 mg via ORAL
  Filled 2021-11-30: qty 5

## 2021-11-30 MED ORDER — DARATUMUMAB-HYALURONIDASE-FIHJ 1800-30000 MG-UT/15ML ~~LOC~~ SOLN
1800.0000 mg | Freq: Once | SUBCUTANEOUS | Status: AC
  Administered 2021-11-30: 1800 mg via SUBCUTANEOUS
  Filled 2021-11-30: qty 15

## 2021-11-30 MED ORDER — HEPARIN SOD (PORK) LOCK FLUSH 100 UNIT/ML IV SOLN
500.0000 [IU] | Freq: Once | INTRAVENOUS | Status: AC
  Administered 2021-11-30: 500 [IU] via INTRAVENOUS

## 2021-11-30 MED ORDER — ACETAMINOPHEN 325 MG PO TABS
650.0000 mg | ORAL_TABLET | Freq: Once | ORAL | Status: AC
  Administered 2021-11-30: 650 mg via ORAL
  Filled 2021-11-30: qty 2

## 2021-11-30 MED ORDER — SODIUM CHLORIDE 0.9% FLUSH
10.0000 mL | INTRAVENOUS | Status: AC
  Administered 2021-11-30: 10 mL via INTRAVENOUS

## 2021-11-30 MED ORDER — POTASSIUM CHLORIDE CRYS ER 20 MEQ PO TBCR
40.0000 meq | EXTENDED_RELEASE_TABLET | Freq: Once | ORAL | Status: AC
  Administered 2021-11-30: 40 meq via ORAL
  Filled 2021-11-30: qty 2

## 2021-11-30 MED ORDER — DIPHENHYDRAMINE HCL 25 MG PO CAPS
50.0000 mg | ORAL_CAPSULE | Freq: Once | ORAL | Status: AC
  Administered 2021-11-30: 50 mg via ORAL
  Filled 2021-11-30: qty 2

## 2021-11-30 MED ORDER — MONTELUKAST SODIUM 10 MG PO TABS
10.0000 mg | ORAL_TABLET | Freq: Once | ORAL | Status: AC
  Administered 2021-11-30: 10 mg via ORAL
  Filled 2021-11-30: qty 1

## 2021-11-30 NOTE — Progress Notes (Signed)
Patient presents today for C1D1 Darzalex Faspro . Vital signs within parameters for treatment. Labs pending. Patient has no complaints today.   Labs within parameters for today's treatment. Potassium 3.1 and Albumin 2.9. Reported potassium and albumin to Dr. Claudina Lick A. Anderson Therapist, sports through [employer]. Standing order to give 40 mEq of Potassium PO for potassium 3.1.   Darzalex Faspro Swe[place]er given today per MD orders. Tolerated without adverse affects. Vital signs stable. No complaints at this time. Discharged from clinic ambulatory in stable condition. Alert and oriented x 3. F/U with [facility] as scheduled.

## 2021-11-30 NOTE — Patient Instructions (Signed)
[facility]  Discharge Instructions: Thank you for choosing [facility] to provide your oncology and hematology care.  If you have a lab appointment with the [facility], please come in thru the Main Entrance and check in at the main information desk.  Wear comfortable clothing and clothing appropriate for easy access to any Portacath or PICC line.   We strive to give you quality time with your provider. You may need to reschedule your appointment if you arrive late (15 or more minutes).  Arriving late affects you and other patients whose appointments are after yours.  Also, if you miss three or more appointments without notifying the office, you may be dismissed from the clinic at the provider's discretion.      For prescription refill requests, have your pharmacy contact our office and allow 72 hours for refills to be completed.    Today you received the following chemotherapy and/or immunotherapy agents Darzalex Faspro [place]. Daratumumab; Hyaluronidase Injection What is this medication? DARATUMUMAB; HYALURONIDASE (dar a toom ue mab; hye al ur ON i dase) treats multiple myeloma, a type of bone marrow cancer. Daratumumab works by blocking a protein that causes cancer cells to grow and multiply. This helps to slow or stop the spread of cancer cells. Hyaluronidase works by increasing the absorption of other medications in the body to help them work better. This medication may also be used treat amyloidosis, a condition that causes the buildup of a protein (amyloid) in your body. It works by reducing the buildup of this protein, which decreases symptoms. It is a combination medication that contains a monoclonal antibody. This medicine may be used for other purposes; ask your health care provider or pharmacist if you have questions. COMMON BRAND NAME(S): DARZALEX FASPRO What should I tell my care team before I take this medication? They need to know if you have any of  these conditions: Heart disease Infection, such as chickenpox, cold sores, herpes, hepatitis B Lung or breathing disease An unusual or allergic reaction to daratumumab, hyaluronidase, other medications, foods, dyes, or preservatives Pregnant or trying to get pregnant Breast-feeding How should I use this medication? This medication is injected under the skin. It is given by your care team in a hospital or clinic setting. Talk to your care team about the use of this medication in children. Special care may be needed. Overdosage: If you think you have taken too much of this medicine contact a poison control center or emergency room at once. NOTE: This medicine is only for you. Do not share this medicine with others. What if I miss a dose? Keep appointments for follow-up doses. It is important not to miss your dose. Call your care team if you are unable to keep an appointment. What may interact with this medication? Interactions have not been studied. This list may not describe all possible interactions. Give your health care provider a list of all the medicines, herbs, non-prescription drugs, or dietary supplements you use. Also tell them if you smoke, drink alcohol, or use illegal drugs. Some items may interact with your medicine. What should I watch for while using this medication? Your condition will be monitored carefully while you are receiving this medication. This medication can cause serious allergic reactions. To reduce your risk, your care team may give you other medication to take before receiving this one. Be sure to follow the directions from your care team. This medication can affect the results of blood tests to  match your blood type. These changes can last for up to 6 months after the final dose. Your care team will do blood tests to match your blood type before you start treatment. Tell all of your care team that you are being treated with this medication before receiving a blood  transfusion. This medication can affect the results of some tests used to determine treatment response; extra tests may be needed to evaluate response. Talk to your care team if you wish to become pregnant or think you are pregnant. This medication can cause serious birth defects if taken during pregnancy and for 3 months after the last dose. A reliable form of contraception is recommended while taking this medication and for 3 months after the last dose. Talk to your care team about effective forms of contraception. Do not breast-feed while taking this medication. What side effects may I notice from receiving this medication? Side effects that you should report to your care team as soon as possible: Allergic reactions--skin rash, itching, hives, swelling of the face, lips, tongue, or throat Heart rhythm changes--fast or irregular heartbeat, dizziness, feeling faint or lightheaded, chest pain, trouble breathing Infection--fever, chills, cough, sore throat, wounds that don't heal, pain or trouble when passing urine, general feeling of discomfort or being unwell Infusion reactions--chest pain, shortness of breath or trouble breathing, feeling faint or lightheaded Sudden eye pain or change in vision such as blurry vision, seeing halos around lights, vision loss Unusual bruising or bleeding Side effects that usually do not require medical attention (report to your care team if they continue or are bothersome): Constipation Diarrhea Fatigue Nausea Pain, tingling, or numbness in the hands or feet Swelling of the ankles, hands, or feet This list may not describe all possible side effects. Call your doctor for medical advice about side effects. You may report side effects to FDA at 1-800-FDA-1088. Where should I keep my medication? This medication is given in a hospital or clinic. It will not be stored at home. NOTE: This sheet is a summary. It may not cover all possible information. If you have  questions about this medicine, talk to your doctor, pharmacist, or health care provider.  2023 Elsevier/Gold Standard (2021-06-03 00:00:00)       To help prevent nausea and vomiting after your treatment, we encourage you to take your nausea medication as directed.  BELOW ARE SYMPTOMS THAT SHOULD BE REPORTED IMMEDIATELY: *FEVER GREATER THAN 100.4 F (38 C) OR HIGHER *CHILLS OR SWEATING *NAUSEA AND VOMITING THAT IS NOT CONTROLLED WITH YOUR NAUSEA MEDICATION *UNUSUAL SHORTNESS OF BREATH *UNUSUAL BRUISING OR BLEEDING *URINARY PROBLEMS (pain or burning when urinating, or frequent urination) *BOWEL PROBLEMS (unusual diarrhea, constipation, pain near the anus) TENDERNESS IN MOUTH AND THROAT WITH OR WITHOUT PRESENCE OF ULCERS (sore throat, sores in mouth, or a toothache) UNUSUAL RASH, SWELLING OR PAIN  UNUSUAL VAGINAL DISCHARGE OR ITCHING   Items with * indicate a potential emergency and should be followed up as soon as possible or go to the Emergency Department if any problems should occur.  Please show the CHEMOTHERAPY ALERT CARD or IMMUNOTHERAPY ALERT CARD at check-in to the Emergency Department and triage nurse.  Should you have questions after your visit or need to cancel or reschedule your appointment, please contact [facility] [phone]  and follow the prompts.  Office hours are 8:00 a.m. to 4:30 p.m. Monday - Friday. Please note that voicemails left after 4:00 p.m. may not be returned until the following business day.  We are closed weekends and major holidays. You have access to a nurse at all times for urgent questions. Please call the main number to the clinic [phone] and follow the prompts.  For any non-urgent questions, you may also contact your provider using MyChart. We now offer e-Visits for anyone 61 and older to request care online for non-urgent symptoms. For details visit [email or web address].   Also download the MyChart app! Go to the app  store, search "MyChart", open the app, select [facility], and log in with your MyChart username and password.  Masks are optional in the cancer centers. If you would like for your care team to wear a mask while they are taking care of you, please let them know. You may have one support person who is at least 86 years old accompany you for your appointments.

## 2021-11-30 NOTE — Progress Notes (Signed)
Patients port flushed without difficulty.  Good blood return noted with no bruising or swelling noted at site.  Stable during access and blood draw.  Patient to remain accessed for treatment. 

## 2021-12-01 NOTE — Progress Notes (Signed)
24 hour call back;  patient states that she feels good.  No nausea, vomiting, or diarrhea.  She also states that she is eating and resting well.

## 2021-12-07 ENCOUNTER — Other Ambulatory Visit: Payer: Self-pay | Admitting: *Deleted

## 2021-12-07 ENCOUNTER — Inpatient Hospital Stay: Payer: Medicare Other | Admitting: Hematology

## 2021-12-07 ENCOUNTER — Other Ambulatory Visit: Payer: Self-pay

## 2021-12-07 ENCOUNTER — Inpatient Hospital Stay: Payer: Medicare Other

## 2021-12-07 DIAGNOSIS — N189 Chronic kidney disease, unspecified: Secondary | ICD-10-CM | POA: Diagnosis not present

## 2021-12-07 DIAGNOSIS — C9000 Multiple myeloma not having achieved remission: Secondary | ICD-10-CM

## 2021-12-07 DIAGNOSIS — D75839 Thrombocytosis, unspecified: Secondary | ICD-10-CM | POA: Diagnosis not present

## 2021-12-07 DIAGNOSIS — E876 Hypokalemia: Secondary | ICD-10-CM

## 2021-12-07 DIAGNOSIS — Z79624 Long term (current) use of inhibitors of nucleotide synthesis: Secondary | ICD-10-CM | POA: Diagnosis not present

## 2021-12-07 DIAGNOSIS — D509 Iron deficiency anemia, unspecified: Secondary | ICD-10-CM | POA: Diagnosis not present

## 2021-12-07 DIAGNOSIS — Z8572 Personal history of non-Hodgkin lymphomas: Secondary | ICD-10-CM | POA: Diagnosis not present

## 2021-12-07 DIAGNOSIS — R42 Dizziness and giddiness: Secondary | ICD-10-CM | POA: Diagnosis not present

## 2021-12-07 DIAGNOSIS — Z79899 Other long term (current) drug therapy: Secondary | ICD-10-CM | POA: Diagnosis not present

## 2021-12-07 DIAGNOSIS — Z5112 Encounter for antineoplastic immunotherapy: Secondary | ICD-10-CM | POA: Diagnosis not present

## 2021-12-07 DIAGNOSIS — D631 Anemia in chronic kidney disease: Secondary | ICD-10-CM | POA: Diagnosis not present

## 2021-12-07 DIAGNOSIS — Z9221 Personal history of antineoplastic chemotherapy: Secondary | ICD-10-CM | POA: Diagnosis not present

## 2021-12-07 DIAGNOSIS — D696 Thrombocytopenia, unspecified: Secondary | ICD-10-CM | POA: Diagnosis not present

## 2021-12-07 DIAGNOSIS — I129 Hypertensive chronic kidney disease with stage 1 through stage 4 chronic kidney disease, or unspecified chronic kidney disease: Secondary | ICD-10-CM | POA: Diagnosis not present

## 2021-12-07 DIAGNOSIS — Z7961 Long term (current) use of immunomodulator: Secondary | ICD-10-CM | POA: Diagnosis not present

## 2021-12-07 DIAGNOSIS — E7800 Pure hypercholesterolemia, unspecified: Secondary | ICD-10-CM | POA: Diagnosis not present

## 2021-12-07 LAB — COMPREHENSIVE METABOLIC PANEL
ALT: 22 U/L (ref 0–44)
AST: 15 U/L (ref 15–41)
Albumin: 2.7 g/dL — ABNORMAL LOW (ref 3.5–5.0)
Alkaline Phosphatase: 48 U/L (ref 38–126)
Anion gap: 6 (ref 5–15)
BUN: 11 mg/dL (ref 8–23)
CO2: 26 mmol/L (ref 22–32)
Calcium: 8.3 mg/dL — ABNORMAL LOW (ref 8.9–10.3)
Chloride: 106 mmol/L (ref 98–111)
Creatinine, Ser: 0.94 mg/dL (ref 0.44–1.00)
GFR, Estimated: 58 mL/min — ABNORMAL LOW (ref >60)
Glucose, Bld: 99 mg/dL (ref 70–99)
Potassium: 2.7 mmol/L — CL (ref 3.5–5.1)
Sodium: 138 mmol/L (ref 135–145)
Total Bilirubin: 0.5 mg/dL (ref 0.3–1.2)
Total Protein: 6.6 g/dL (ref 6.5–8.1)

## 2021-12-07 LAB — CBC WITH DIFFERENTIAL/PLATELET
Abs Immature Granulocytes: 0.03 10*3/uL (ref 0.00–0.07)
Basophils Absolute: 0 10*3/uL (ref 0.0–0.1)
Basophils Relative: 0 %
Eosinophils Absolute: 0.1 10*3/uL (ref 0.0–0.5)
Eosinophils Relative: 1 %
HCT: 26.6 % — ABNORMAL LOW (ref 36.0–46.0)
Hemoglobin: 8.7 g/dL — ABNORMAL LOW (ref 12.0–15.0)
Immature Granulocytes: 1 %
Lymphocytes Relative: 39 %
Lymphs Abs: 1.8 10*3/uL (ref 0.7–4.0)
MCH: 32 pg (ref 26.0–34.0)
MCHC: 32.7 g/dL (ref 30.0–36.0)
MCV: 97.8 fL (ref 80.0–100.0)
Monocytes Absolute: 0.4 10*3/uL (ref 0.1–1.0)
Monocytes Relative: 9 %
Neutro Abs: 2.3 10*3/uL (ref 1.7–7.7)
Neutrophils Relative %: 50 %
Platelets: 234 10*3/uL (ref 150–400)
RBC: 2.72 MIL/uL — ABNORMAL LOW (ref 3.87–5.11)
RDW: 22.9 % — ABNORMAL HIGH (ref 11.5–15.5)
WBC: 4.7 10*3/uL (ref 4.0–10.5)
nRBC: 0 % (ref 0.0–0.2)

## 2021-12-07 LAB — MAGNESIUM: Magnesium: 2 mg/dL (ref 1.7–2.4)

## 2021-12-07 MED ORDER — SODIUM CHLORIDE 0.9 % IV SOLN: INTRAVENOUS | Status: DC

## 2021-12-07 MED ORDER — SODIUM CHLORIDE 0.9% FLUSH
10.0000 mL | INTRAVENOUS | Status: DC | PRN
  Administered 2021-12-07: 10 mL via INTRAVENOUS

## 2021-12-07 MED ORDER — POTASSIUM CHLORIDE IN NACL 20-0.9 MEQ/L-% IV SOLN
Freq: Once | INTRAVENOUS | Status: DC
  Filled 2021-12-07: qty 1000

## 2021-12-07 MED ORDER — ACETAMINOPHEN 325 MG PO TABS
650.0000 mg | ORAL_TABLET | Freq: Once | ORAL | Status: AC
  Administered 2021-12-07: 650 mg via ORAL
  Filled 2021-12-07: qty 2

## 2021-12-07 MED ORDER — MONTELUKAST SODIUM 10 MG PO TABS
10.0000 mg | ORAL_TABLET | Freq: Once | ORAL | Status: AC
  Administered 2021-12-07: 10 mg via ORAL
  Filled 2021-12-07: qty 1

## 2021-12-07 MED ORDER — DIPHENHYDRAMINE HCL 25 MG PO CAPS
25.0000 mg | ORAL_CAPSULE | Freq: Once | ORAL | Status: AC
  Administered 2021-12-07: 25 mg via ORAL
  Filled 2021-12-07: qty 1

## 2021-12-07 MED ORDER — POTASSIUM CHLORIDE CRYS ER 20 MEQ PO TBCR
40.0000 meq | EXTENDED_RELEASE_TABLET | ORAL | Status: AC
  Administered 2021-12-07 (×2): 40 meq via ORAL
  Filled 2021-12-07 (×2): qty 2

## 2021-12-07 MED ORDER — DARATUMUMAB-HYALURONIDASE-FIHJ 1800-30000 MG-UT/15ML ~~LOC~~ SOLN
1800.0000 mg | Freq: Once | SUBCUTANEOUS | Status: AC
  Administered 2021-12-07: 1800 mg via SUBCUTANEOUS
  Filled 2021-12-07: qty 15

## 2021-12-07 MED ORDER — POTASSIUM CHLORIDE CRYS ER 20 MEQ PO TBCR: 40.0000 meq | EXTENDED_RELEASE_TABLET | Freq: Every day | ORAL | 4 refills | Status: DC

## 2021-12-07 MED ORDER — DEXAMETHASONE 4 MG PO TABS
20.0000 mg | ORAL_TABLET | Freq: Once | ORAL | Status: AC
  Administered 2021-12-07: 20 mg via ORAL
  Filled 2021-12-07: qty 5

## 2021-12-07 MED ORDER — POTASSIUM CHLORIDE 10 MEQ/100ML IV SOLN
10.0000 meq | INTRAVENOUS | Status: AC
  Administered 2021-12-07 (×2): 10 meq via INTRAVENOUS
  Filled 2021-12-07 (×2): qty 100

## 2021-12-07 MED ORDER — HEPARIN SOD (PORK) LOCK FLUSH 100 UNIT/ML IV SOLN
500.0000 [IU] | Freq: Once | INTRAVENOUS | Status: AC
  Administered 2021-12-07: 500 [IU] via INTRAVENOUS

## 2021-12-07 NOTE — Progress Notes (Signed)
CRITICAL VALUE ALERT Critical value received:  potassium 2.7 Date of notification:  12-07-2021 Time of notification: 10:10 am. Critical value read back:  Yes.   Nurse who received alert:  B.Haydin Dunn RN MD notified time and response:  Dr. Delton Coombes at 10:11 am. Standing order protocol. 40 mEq PO, 20 mEq IV potassium chloride, 40 mEq PO.

## 2021-12-07 NOTE — Progress Notes (Signed)
Leslie Williamson, Leslie Williamson [id number]   CLINIC:  Medical Oncology/Hematology  PCP:  Leslie Meiers, MD Leslie Williamson [id number] [phone]   REASON FOR VISIT:  Follow-up for newly diagnosed multiple myeloma  PRIOR THERAPY: None  NGS Results: Not applicable  CURRENT THERAPY: Daratumumab, Revlimid and dexamethasone  BRIEF ONCOLOGIC HISTORY:  Oncology History  NHL (non-Hodgkin's lymphoma) (Leslie Williamson)  07/19/1995 Pathology Results   Spleen biopsy with resection- low-grade B-cell lymphoma, splenic marginal zone type   02/20/1998 Imaging   CT CAP-marked and extensive cervical adenopathy   02/25/1998 Pathology Results   Right cervical lymph node demonstrating large B-cell lymphoma   03/05/1998 Bone Marrow Biopsy   No evidence of bone marrow involvement   03/11/1998 - 07/18/1998 Chemotherapy   CHOP x 7 cycles   05/09/1998 Imaging   CT CAP and neck- slight interval decrease in size of para-aortic adenopathy with interval decrease in right inguinal and bi-iliac adenopathy.  Interval decrease in size and number of enlarged cervical nodes   07/10/1998 Imaging   Ct CAP and neck- unremarkable CT of chest. No enlarged retroperitoneal adenopathy withthe previously noted retroperitoneal nodes currently smaller to stable in size.  Cervical adenopathy is stable to slightly smaller in size.   08/18/1998 - 09/08/1998 Chemotherapy   Rituxan Day 1, 8, 15, 22 x 1 cycle   01/08/1999 Remission   CT CAP and neck demonstrates no adenopathy or disease   01/08/1999 Imaging   CT CAP and neck- Stable CT of chest. Stable CT abd/pelvis without adenopathy.  Negative CT of neck   Multiple myeloma (Leslie Williamson)  11/16/2021 Initial Diagnosis   Multiple myeloma (Leslie Williamson)   11/30/2021 -  Chemotherapy   Patient is on Treatment Plan : MYELOMA  Daratumumab SQ + Lenalidomide + Dexamethasone (DaraRd) q28d       CANCER STAGING:  Cancer Staging  Multiple myeloma  (Leslie Williamson) Staging form: Plasma Cell Myeloma and Plasma Cell Disorders, AJCC 8th Edition - Clinical stage from 11/16/2021: Albumin (g/dL): 3.1, ISS: Stage II, High-risk cytogenetics: Absent, LDH: Normal - Unsigned   INTERVAL HISTORY:  Leslie Williamson 88 y.o. female seen for follow-up and toxicity assessment prior to next treatment.  She started daratumumab and lenalidomide on 11/30/2021.  She denies any GI side effects.  Occasional dizziness present.  Energy levels are 50%.  Denies any fevers or chills.   REVIEW OF SYSTEMS:  Review of Systems  Psychiatric/Behavioral:  Positive for sleep disturbance.   All other systems reviewed and are negative.    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   Vertigo, labyrinthine    Past Surgical History:  Procedure Laterality Date   CATARACT EXTRACTION W/PHACO Left 11/03/2015   Procedure: CATARACT EXTRACTION PHACO AND INTRAOCULAR LENS PLACEMENT ([facility]);  Surgeon: Tonny Branch, MD;  Location: [facility];  Service: Ophthalmology;  Laterality: Left;  CDE: 7.74  CATARACT EXTRACTION W/PHACO Right 11/20/2015   Procedure: CATARACT EXTRACTION PHACO AND INTRAOCULAR LENS PLACEMENT ; CDE:  8.30;  Surgeon: Kerry Hunt, MD;  Location: [facility];  Service: Ophthalmology;  Laterality: Right;   IR IMAGING GUIDED PORT INSERTION  11/04/2021   PORT-A-CATH REMOVAL       SOCIAL HISTORY:  Social History   Socioeconomic History   Marital status: Widowed    Spouse  name: Not on file   Number of children: Not on file   Years of education: Not on file   Highest education level: Not on file  Occupational History   Not on file  Tobacco Use   Smoking status: Former   Smokeless tobacco: Never  Vaping Use   Vaping Use: Never used  Substance and Sexual Activity   Alcohol use: No   Drug use: No   Sexual activity: Not Currently  Other Topics Concern   Not on file  Social History Narrative   Not on file   Social Determinants of Health   Financial Resource Strain: Low Risk  (12/31/2019)   Overall Financial Resource Strain (CARDIA)    Difficulty of Paying Living Expenses: Not hard at all  Food Insecurity: No Food Insecurity (12/31/2019)   Hunger Vital Sign    Worried About Running Out of Food in the Last Year: Never true    Ran Out of Food in the Last Year: Never true  Transportation Needs: No Transportation Needs (12/31/2019)   PRAPARE - Transportation    Lack of Transportation (Medical): No    Lack of Transportation (Non-Medical): No  Physical Activity: Inactive (12/31/2019)   Exercise Vital Sign    Days of Exercise per Week: 0 days    Minutes of Exercise per Session: 0 min  Stress: No Stress Concern Present (12/31/2019)   [employer] - Occupational Stress Questionnaire    Feeling of Stress : Not at all  Social Connections: Moderately Isolated (12/31/2019)   Social Connection and Isolation Panel [NHANES]    Frequency of Communication with Friends and Family: More than three times a week    Frequency of Social Gatherings with Friends and Family: More than three times a week    Attends Religious Services: More than 4 times per year    Active Member of Clubs or Organizations: No    Attends Club or Organization Meetings: Never    Marital Status: Widowed  Intimate Partner Violence: Not At Risk (12/31/2019)   Humiliation, Afraid, Rape, and Kick questionnaire    Fear of Current or Ex-Partner: No    Emotionally Abused: No     Physically Abused: No    Sexually Abused: No    FAMILY HISTORY:  Family History  Problem Relation Age of Onset   Diabetes Mother     CURRENT MEDICATIONS:  Outpatient Encounter Medications as of 12/07/2021  Medication Sig   acyclovir (ZOVIRAX) 400 MG tablet Take 1 tablet (400 mg total) by mouth 2 (two) times daily.   amLODipine (NORVASC) 5 MG tablet Take 1 tablet (5 mg total) by mouth daily.   aspirin 81 MG tablet Take 81 mg by mouth daily.   atorvastatin (LIPITOR) 40 MG tablet Take 40 mg by mouth daily.   busPIRone (BUSPAR) 5 MG tablet Take 5 mg by mouth at bedtime as needed (anxiety/sleep).   cilostazol (PLETAL) 100 MG tablet Take 100 mg by mouth 2 (two) times daily.   ferrous sulfate 325 (65 FE) MG EC tablet Take 1 tablet (325 mg total)   by mouth daily with breakfast.   lenalidomide (REVLIMID) 15 MG capsule Take 1 capsule (15 mg total) by mouth daily. Take for 21 days on, 7 days off.   lidocaine-prilocaine (EMLA) cream Apply 1 Application topically as needed.   potassium chloride SA (KLOR-CON M) 20 MEQ tablet Take 2 tablets (40 mEq total) by mouth daily. Take 2 tablets by mouth every 4 hours x 2 today; then take 2 tablets by mouth tomorrow morning   prochlorperazine (COMPAZINE) 10 MG tablet Take by mouth.   prochlorperazine (COMPAZINE) 5 MG tablet Take 1 tablet (5 mg total) by mouth every 6 (six) hours as needed for nausea or vomiting.   traZODone (DESYREL) 50 MG tablet Take 50 mg by mouth at bedtime as needed.   [DISCONTINUED] potassium chloride SA (KLOR-CON M) 20 MEQ tablet Take 2 tablets by mouth every 4 hours x 2 today; then take 2 tablets by mouth tomorrow morning   No facility-administered encounter medications on file as of 12/07/2021.    ALLERGIES:  Allergies  Allergen Reactions   Penicillins Rash     PHYSICAL EXAM:  ECOG Performance status: 1  There were no vitals filed for this visit.  There were no vitals filed for this visit.  Physical Exam Vitals  reviewed.  Constitutional:      Appearance: Normal appearance.  Cardiovascular:     Rate and Rhythm: Normal rate and regular rhythm.     Heart sounds: Normal heart sounds.  Pulmonary:     Breath sounds: Normal breath sounds.  Abdominal:     Palpations: Abdomen is soft. There is no mass.  Neurological:     General: No focal deficit present.     Mental Status: She is alert and oriented to person, place, and time.  Psychiatric:        Mood and Affect: Mood normal.        Behavior: Behavior normal.      LABORATORY DATA:  I have reviewed the labs as listed.  CBC    Component Value Date/Time   WBC 4.7 12/07/2021 0932   RBC 2.72 (L) 12/07/2021 0932   HGB 8.7 (L) 12/07/2021 0932   HCT 26.6 (L) 12/07/2021 0932   PLT 234 12/07/2021 0932   MCV 97.8 12/07/2021 0932   MCH 32.0 12/07/2021 0932   MCHC 32.7 12/07/2021 0932   RDW 22.9 (H) 12/07/2021 0932   LYMPHSABS 1.8 12/07/2021 0932   MONOABS 0.4 12/07/2021 0932   EOSABS 0.1 12/07/2021 0932   BASOSABS 0.0 12/07/2021 0932      Latest Ref Rng & Units 12/07/2021    9:32 AM 11/30/2021    8:55 AM 11/26/2021   12:43 PM  CMP  Glucose 70 - 99 mg/dL 99  127    BUN 8 - 23 mg/dL 11  11    Creatinine 0.44 - 1.00 mg/dL 0.94  0.95    Sodium 135 - 145 mmol/L 138  139    Potassium 3.5 - 5.1 mmol/L 2.7  3.1  3.9   Chloride 98 - 111 mmol/L 106  109    CO2 22 - 32 mmol/L 26  25    Calcium 8.9 - 10.3 mg/dL 8.3  8.7    Total Protein 6.5 - 8.1 g/dL 6.6  7.9    Total Bilirubin 0.3 - 1.2 mg/dL 0.5  0.6    Alkaline Phos 38 - 126 U/L 48  57    AST 15 - 41 U/L 15  15    ALT 0 -  44 U/L 22  10      DIAGNOSTIC IMAGING:  I have independently reviewed the scans and discussed with the patient.  ASSESSMENT:  1.  Biclonal IgG lambda and IgM kappa multiple myeloma, high risk: - BMBX (11/02/2021): IgG lambda plasma cells 70% - Myeloma FISH panel: Monosomy 13 (standard GIST), duplication of 1 q. (high risk), gain of 11 q. (standard risk). -  Cytogenetics: Hyperdiploid E weight gain (trisomy/trisomy) of odd-numbered chromosomes and monosomy 13, standard risk.  Gain of 1 q. associated with progressive course. - PET scan (11/05/2021): No evidence of multiple myeloma.  No hypermetabolic adenopathy. - Daratumumab, Revlimid and dexamethasone cycle 1 started on 11/30/2021  2.  JAK2 positive essential thrombocytosis: - BMBX (11/02/2021): No evidence of fibrosis.  No morphological evidence of JAK2 positive ET.  3.  History of marginal zone lymphoma of the spleen: - Diagnosed in 1999, status post CHOP x7 cycles followed by rituximab 1 cycle. - Status post splenectomy.  No evidence of lymphoma on current PET scan.  4.  Social/family history: - She lives with her sister.  Son lives 30 minutes away.  She is independent of ADLs and some IADLs.  She drives.  She worked at [employer] and denies any exposure to chemicals or pesticides.  No family history of malignancies.    PLAN:  1.  IgG lambda multiple myeloma: - She started cycle 1 of daratumumab, Revlimid and dexamethasone on 11/30/2021. - She is taking Revlimid 15 mg 21 days on/7 days off and dexamethasone 20 mg weekly. - She did not have any GI side effects. - Reviewed labs today which showed normal LFTs and creatinine.  CBC was grossly normal with hemoglobin stable at 8.7. - Proceed with day 8 of Darzalex today.  To continue Revlimid and dexamethasone. - RTC 3 weeks for follow-up and we will repeat myeloma labs to start cycle 2.  2.  Anemia from CKD and iron deficiency: - Status post Venofer x3 from 10/23/2021 through 11/13/2021. - Hemoglobin today is 8.7, partly from myelosuppression.  3.  ID prophylaxis: - Continue acyclovir twice daily for shingles prophylaxis.  Continue aspirin 81 mg daily for thromboprophylaxis.  4.  Bone protection: - We will consider DEXA scan and denosumab in the future.  5.  Hypokalemia: - Potassium has been repeatedly low.  Today it is 2.7.  She  will have repletion of potassium. - We will start her on 40 mEq of potassium daily at home.      Orders placed this encounter:  No orders of the defined types were placed in this encounter.     Sreedhar Katragadda, MD [facility] [phone]   

## 2021-12-07 NOTE — Patient Instructions (Signed)
[facility]  Discharge Instructions: Thank you for choosing [facility] to provide your oncology and hematology care.  If you have a lab appointment with the [facility], please come in thru the Main Entrance and check in at the main information desk.  Wear comfortable clothing and clothing appropriate for easy access to any Portacath or PICC line.   We strive to give you quality time with your provider. You may need to reschedule your appointment if you arrive late (15 or more minutes).  Arriving late affects you and other patients whose appointments are after yours.  Also, if you miss three or more appointments without notifying the office, you may be dismissed from the clinic at the provider's discretion.      For prescription refill requests, have your pharmacy contact our office and allow 72 hours for refills to be completed.    Today you received the following chemotherapy and/or immunotherapy agents Daratumumab SQ   To help prevent nausea and vomiting after your treatment, we encourage you to take your nausea medication as directed.  Daratumumab Injection What is this medication? DARATUMUMAB (dar a toom ue mab) treats multiple myeloma, a type of bone marrow cancer. It works by helping your immune system slow or stop the spread of cancer cells. It is a monoclonal antibody. This medicine may be used for other purposes; ask your health care provider or pharmacist if you have questions. COMMON BRAND NAME(S): DARZALEX What should I tell my care team before I take this medication? They need to know if you have any of these conditions: Hereditary fructose intolerance Infection, such as chickenpox, herpes, hepatitis B virus Lung or breathing disease, such as asthma, COPD An unusual or allergic reaction to daratumumab, sorbitol, other medications, foods, dyes, or preservatives Pregnant or trying to get pregnant Breast-feeding How should I use this  medication? This medication is injected into a vein. It is given by your care team in a hospital or clinic setting. Talk to your care team about the use of this medication in children. Special care may be needed. Overdosage: If you think you have taken too much of this medicine contact a poison control center or emergency room at once. NOTE: This medicine is only for you. Do not share this medicine with others. What if I miss a dose? Keep appointments for follow-up doses. It is important not to miss your dose. Call your care team if you are unable to keep an appointment. What may interact with this medication? Interactions have not been studied. This list may not describe all possible interactions. Give your health care provider a list of all the medicines, herbs, non-prescription drugs, or dietary supplements you use. Also tell them if you smoke, drink alcohol, or use illegal drugs. Some items may interact with your medicine. What should I watch for while using this medication? Your condition will be monitored carefully while you are receiving this medication. This medication can cause serious allergic reactions. To reduce your risk, your care team may give you other medication to take before receiving this one. Be sure to follow the directions from your care team. This medication can affect the results of blood tests to match your blood type. These changes can last for up to 6 months after the final dose. Your care team will do blood tests to match your blood type before you start treatment. Tell all of your care team that you are being treated with this medication before receiving  a blood transfusion. This medication can affect the results of some tests used to determine treatment response; extra tests may be needed to evaluate response. Talk to your care team if you wish to become pregnant or think you are pregnant. This medication can cause serious birth defects if taken during pregnancy and for  3 months after the last dose. A reliable form of contraception is recommended while taking this medication and for 3 months after the last dose. Talk to your care team about effective forms of contraception. Do not breast-feed while taking this medication. What side effects may I notice from receiving this medication? Side effects that you should report to your care team as soon as possible: Allergic reactions--skin rash, itching, hives, swelling of the face, lips, tongue, or throat Infection--fever, chills, cough, sore throat, wounds that don't heal, pain or trouble when passing urine, general feeling of discomfort or being unwell Infusion reactions--chest pain, shortness of breath or trouble breathing, feeling faint or lightheaded Unusual bruising or bleeding Side effects that usually do not require medical attention (report to your care team if they continue or are bothersome): Constipation Diarrhea Fatigue Nausea Pain, tingling, or numbness in the hands or feet Swelling of the ankles, hands, or feet This list may not describe all possible side effects. Call your doctor for medical advice about side effects. You may report side effects to FDA at 1-800-FDA-1088. Where should I keep my medication? This medication is given in a hospital or clinic. It will not be stored at home. NOTE: This sheet is a summary. It may not cover all possible information. If you have questions about this medicine, talk to your doctor, pharmacist, or health care provider.  2023 Elsevier/Gold Standard (2014-01-07 00:00:00)   BELOW ARE SYMPTOMS THAT SHOULD BE REPORTED IMMEDIATELY: *FEVER GREATER THAN 100.4 F (38 C) OR HIGHER *CHILLS OR SWEATING *NAUSEA AND VOMITING THAT IS NOT CONTROLLED WITH YOUR NAUSEA MEDICATION *UNUSUAL SHORTNESS OF BREATH *UNUSUAL BRUISING OR BLEEDING *URINARY PROBLEMS (pain or burning when urinating, or frequent urination) *BOWEL PROBLEMS (unusual diarrhea, constipation, pain near the  anus) TENDERNESS IN MOUTH AND THROAT WITH OR WITHOUT PRESENCE OF ULCERS (sore throat, sores in mouth, or a toothache) UNUSUAL RASH, SWELLING OR PAIN  UNUSUAL VAGINAL DISCHARGE OR ITCHING   Items with * indicate a potential emergency and should be followed up as soon as possible or go to the Emergency Department if any problems should occur.  Please show the CHEMOTHERAPY ALERT CARD or IMMUNOTHERAPY ALERT CARD at check-in to the Emergency Department and triage nurse.  Should you have questions after your visit or need to cancel or reschedule your appointment, please contact [facility] [phone]  and follow the prompts.  Office hours are 8:00 a.m. to 4:30 p.m. Monday - Friday. Please note that voicemails left after 4:00 p.m. may not be returned until the following business day.  We are closed weekends and major holidays. You have access to a nurse at all times for urgent questions. Please call the main number to the clinic [phone] and follow the prompts.  For any non-urgent questions, you may also contact your provider using MyChart. We now offer e-Visits for anyone 39 and older to request care online for non-urgent symptoms. For details visit [email or web address].   Also download the MyChart app! Go to the app store, search "MyChart", open the app, select [facility], and log in with your MyChart username and password.  Masks are optional in the cancer centers. If you  would like for your care team to wear a mask while they are taking care of you, please let them know. You may have one support person who is at least 86 years old accompany you for your appointments.

## 2021-12-07 NOTE — Progress Notes (Signed)
Patient is taking Revlimid as prescribed.  She has not missed any doses and reports no side effects at this time.    Patient has been examined by Dr. Katragadda, and vital signs and labs have been reviewed. ANC, Creatinine, LFTs, hemoglobin, and platelets are within treatment parameters per M.D. - pt may proceed with treatment.  Primary RN and pharmacy notified.  

## 2021-12-07 NOTE — Patient Instructions (Addendum)
[facility] at [facility] Discharge Instructions   You were seen and examined today by Dr. Delton Coombes.  He reviewed the results of your lab work which are normal/stable, except your potassium. It is low at 2.7. We will give you potassium her in the clinic. We also sent a prescription for you to take potassium at home.   We will proceed with your treatment today.   Continue Revlimid as prescribed.   Return as scheduled.    Thank you for choosing [facility] at [facility] to provide your oncology and hematology care.  To afford each patient quality time with our provider, please arrive at least 15 minutes before your scheduled appointment time.   If you have a lab appointment with the [facility] please come in thru the Main Entrance and check in at the main information desk.  You need to re-schedule your appointment should you arrive 10 or more minutes late.  We strive to give you quality time with our providers, and arriving late affects you and other patients whose appointments are after yours.  Also, if you no show three or more times for appointments you may be dismissed from the clinic at the providers discretion.     Again, thank you for choosing [facility].  Our hope is that these requests will decrease the amount of time that you wait before being seen by our physicians.       _____________________________________________________________  Should you have questions after your visit to [facility], please contact our office at [phone] and follow the prompts.  Our office hours are 8:00 a.m. and 4:30 p.m. Monday - Friday.  Please note that voicemails left after 4:00 p.m. may not be returned until the following business day.  We are closed weekends and major holidays.  You do have access to a nurse 24-7, just call the main number to the clinic [phone] and do not press any options, hold on the line and a  nurse will answer the phone.    For prescription refill requests, have your pharmacy contact our office and allow 72 hours.    Due to Covid, you will need to wear a mask upon entering the hospital. If you do not have a mask, a mask will be given to you at the Main Entrance upon arrival. For doctor visits, patients may have 1 support person age 53 or older with them. For treatment visits, patients can not have anyone with them due to social distancing guidelines and our immunocompromised population.

## 2021-12-07 NOTE — Progress Notes (Signed)
Pt presents today for Daratumumab  [place] per provider's order. Vital signs and other labs WNL for treatment. Pt's potassium was 2.7. Dr.K  stated to give 40 mEq PO, 20 mEq IV, and 40 mEq PO. Okay to proceed with treatment today  per Dr.K.  Daratumumab [place] and 40 mEq PO, 20 mEq IV, and 40 mEq PO given today per MD orders. Tolerated infusion without adverse affects. Vital signs stable. No complaints at this time. Discharged from clinic ambulatory in stable condition. Alert and oriented x 3. F/U with [facility] as scheduled.

## 2021-12-07 NOTE — Progress Notes (Signed)
Decrease diphenhydramine to 25 mg PO per MD due to age.  T.O. Dr Rhys Martini, PharmD

## 2021-12-14 ENCOUNTER — Inpatient Hospital Stay: Payer: Medicare Other

## 2021-12-14 ENCOUNTER — Other Ambulatory Visit: Payer: Self-pay

## 2021-12-14 VITALS — BP 146/81 | HR 65 | Temp 97.6°F | Resp 20

## 2021-12-14 DIAGNOSIS — Z5112 Encounter for antineoplastic immunotherapy: Secondary | ICD-10-CM | POA: Diagnosis not present

## 2021-12-14 DIAGNOSIS — C9000 Multiple myeloma not having achieved remission: Secondary | ICD-10-CM

## 2021-12-14 DIAGNOSIS — E7800 Pure hypercholesterolemia, unspecified: Secondary | ICD-10-CM | POA: Diagnosis not present

## 2021-12-14 DIAGNOSIS — D631 Anemia in chronic kidney disease: Secondary | ICD-10-CM | POA: Diagnosis not present

## 2021-12-14 DIAGNOSIS — Z7961 Long term (current) use of immunomodulator: Secondary | ICD-10-CM | POA: Diagnosis not present

## 2021-12-14 DIAGNOSIS — N189 Chronic kidney disease, unspecified: Secondary | ICD-10-CM | POA: Diagnosis not present

## 2021-12-14 DIAGNOSIS — R42 Dizziness and giddiness: Secondary | ICD-10-CM | POA: Diagnosis not present

## 2021-12-14 DIAGNOSIS — Z79899 Other long term (current) drug therapy: Secondary | ICD-10-CM | POA: Diagnosis not present

## 2021-12-14 DIAGNOSIS — Z8572 Personal history of non-Hodgkin lymphomas: Secondary | ICD-10-CM | POA: Diagnosis not present

## 2021-12-14 DIAGNOSIS — I129 Hypertensive chronic kidney disease with stage 1 through stage 4 chronic kidney disease, or unspecified chronic kidney disease: Secondary | ICD-10-CM | POA: Diagnosis not present

## 2021-12-14 DIAGNOSIS — D696 Thrombocytopenia, unspecified: Secondary | ICD-10-CM | POA: Diagnosis not present

## 2021-12-14 DIAGNOSIS — D509 Iron deficiency anemia, unspecified: Secondary | ICD-10-CM | POA: Diagnosis not present

## 2021-12-14 DIAGNOSIS — D75839 Thrombocytosis, unspecified: Secondary | ICD-10-CM | POA: Diagnosis not present

## 2021-12-14 DIAGNOSIS — Z79624 Long term (current) use of inhibitors of nucleotide synthesis: Secondary | ICD-10-CM | POA: Diagnosis not present

## 2021-12-14 DIAGNOSIS — Z9221 Personal history of antineoplastic chemotherapy: Secondary | ICD-10-CM | POA: Diagnosis not present

## 2021-12-14 DIAGNOSIS — E876 Hypokalemia: Secondary | ICD-10-CM | POA: Diagnosis not present

## 2021-12-14 DIAGNOSIS — Z95828 Presence of other vascular implants and grafts: Secondary | ICD-10-CM

## 2021-12-14 LAB — CBC WITH DIFFERENTIAL/PLATELET
Abs Immature Granulocytes: 0.01 10*3/uL (ref 0.00–0.07)
Basophils Absolute: 0 10*3/uL (ref 0.0–0.1)
Basophils Relative: 0 %
Eosinophils Absolute: 0 10*3/uL (ref 0.0–0.5)
Eosinophils Relative: 1 %
HCT: 29.2 % — ABNORMAL LOW (ref 36.0–46.0)
Hemoglobin: 9.5 g/dL — ABNORMAL LOW (ref 12.0–15.0)
Immature Granulocytes: 0 %
Lymphocytes Relative: 49 %
Lymphs Abs: 2 10*3/uL (ref 0.7–4.0)
MCH: 31.9 pg (ref 26.0–34.0)
MCHC: 32.5 g/dL (ref 30.0–36.0)
MCV: 98 fL (ref 80.0–100.0)
Monocytes Absolute: 0.6 10*3/uL (ref 0.1–1.0)
Monocytes Relative: 14 %
Neutro Abs: 1.4 10*3/uL — ABNORMAL LOW (ref 1.7–7.7)
Neutrophils Relative %: 36 %
Platelets: 220 10*3/uL (ref 150–400)
RBC: 2.98 MIL/uL — ABNORMAL LOW (ref 3.87–5.11)
RDW: 22.9 % — ABNORMAL HIGH (ref 11.5–15.5)
WBC: 4 10*3/uL (ref 4.0–10.5)
nRBC: 0.7 % — ABNORMAL HIGH (ref 0.0–0.2)

## 2021-12-14 LAB — COMPREHENSIVE METABOLIC PANEL
ALT: 27 U/L (ref 0–44)
AST: 10 U/L — ABNORMAL LOW (ref 15–41)
Albumin: 3 g/dL — ABNORMAL LOW (ref 3.5–5.0)
Alkaline Phosphatase: 54 U/L (ref 38–126)
Anion gap: 9 (ref 5–15)
BUN: 14 mg/dL (ref 8–23)
CO2: 24 mmol/L (ref 22–32)
Calcium: 8.6 mg/dL — ABNORMAL LOW (ref 8.9–10.3)
Chloride: 104 mmol/L (ref 98–111)
Creatinine, Ser: 1.15 mg/dL — ABNORMAL HIGH (ref 0.44–1.00)
GFR, Estimated: 46 mL/min — ABNORMAL LOW (ref >60)
Glucose, Bld: 85 mg/dL (ref 70–99)
Potassium: 4.2 mmol/L (ref 3.5–5.1)
Sodium: 137 mmol/L (ref 135–145)
Total Bilirubin: 0.5 mg/dL (ref 0.3–1.2)
Total Protein: 6.6 g/dL (ref 6.5–8.1)

## 2021-12-14 LAB — MAGNESIUM: Magnesium: 2.1 mg/dL (ref 1.7–2.4)

## 2021-12-14 MED ORDER — DEXAMETHASONE 4 MG PO TABS
20.0000 mg | ORAL_TABLET | Freq: Once | ORAL | Status: AC
  Administered 2021-12-14: 20 mg via ORAL
  Filled 2021-12-14: qty 5

## 2021-12-14 MED ORDER — DIPHENHYDRAMINE HCL 25 MG PO CAPS
25.0000 mg | ORAL_CAPSULE | Freq: Once | ORAL | Status: AC
  Administered 2021-12-14: 25 mg via ORAL
  Filled 2021-12-14: qty 1

## 2021-12-14 MED ORDER — SODIUM CHLORIDE 0.9% FLUSH
10.0000 mL | Freq: Once | INTRAVENOUS | Status: AC
  Administered 2021-12-14: 10 mL via INTRAVENOUS

## 2021-12-14 MED ORDER — DARATUMUMAB-HYALURONIDASE-FIHJ 1800-30000 MG-UT/15ML ~~LOC~~ SOLN
1800.0000 mg | Freq: Once | SUBCUTANEOUS | Status: AC
  Administered 2021-12-14: 1800 mg via SUBCUTANEOUS
  Filled 2021-12-14: qty 15

## 2021-12-14 MED ORDER — HEPARIN SOD (PORK) LOCK FLUSH 100 UNIT/ML IV SOLN
500.0000 [IU] | Freq: Once | INTRAVENOUS | Status: AC
  Administered 2021-12-14: 500 [IU] via INTRAVENOUS

## 2021-12-14 MED ORDER — MONTELUKAST SODIUM 10 MG PO TABS
10.0000 mg | ORAL_TABLET | Freq: Once | ORAL | Status: AC
  Administered 2021-12-14: 10 mg via ORAL
  Filled 2021-12-14: qty 1

## 2021-12-14 MED ORDER — ACETAMINOPHEN 325 MG PO TABS
650.0000 mg | ORAL_TABLET | Freq: Once | ORAL | Status: AC
  Administered 2021-12-14: 650 mg via ORAL
  Filled 2021-12-14: qty 2

## 2021-12-14 MED ORDER — LENALIDOMIDE 15 MG PO CAPS: 15.0000 mg | ORAL_CAPSULE | Freq: Every day | ORAL | 0 refills | Status: DC

## 2021-12-14 NOTE — Progress Notes (Signed)
Patients port flushed without difficulty.  Good blood return noted with no bruising or swelling noted at site.  Stable during access and blood draw.  Patient to remain accessed for treatment. 

## 2021-12-14 NOTE — Progress Notes (Signed)
Patient presents today for Daratumumab, [place] 1.4 Dede Query PA-C made aware, okay to treat.  Port flushed with good blood return noted. No bruising or swelling at site. Bandaid applied. Patient tolerated Daratumumab injection with no complaints voiced. See MAR for details. Lab reviewed. Injection site clean and dry with no bruising or swelling noted at site. Band aid applied. Vss with discharge and left in satisfactory condition with nos/s of distress noted.

## 2021-12-14 NOTE — Patient Instructions (Signed)
[facility]  Discharge Instructions: Thank you for choosing [facility] to provide your oncology and hematology care.  If you have a lab appointment with the Cancer Center, please come in thru the Main Entrance and check in at the main information desk.  Wear comfortable clothing and clothing appropriate for easy access to any Portacath or PICC line.   We strive to give you quality time with your provider. You may need to reschedule your appointment if you arrive late (15 or more minutes).  Arriving late affects you and other patients whose appointments are after yours.  Also, if you miss three or more appointments without notifying the office, you may be dismissed from the clinic at the provider's discretion.      For prescription refill requests, have your pharmacy contact our office and allow 72 hours for refills to be completed.    Today you received the following chemotherapy and/or immunotherapy agents Daratumumab, return as scheduled.   To help prevent nausea and vomiting after your treatment, we encourage you to take your nausea medication as directed.  BELOW ARE SYMPTOMS THAT SHOULD BE REPORTED IMMEDIATELY: *FEVER GREATER THAN 100.4 F (38 C) OR HIGHER *CHILLS OR SWEATING *NAUSEA AND VOMITING THAT IS NOT CONTROLLED WITH YOUR NAUSEA MEDICATION *UNUSUAL SHORTNESS OF BREATH *UNUSUAL BRUISING OR BLEEDING *URINARY PROBLEMS (pain or burning when urinating, or frequent urination) *BOWEL PROBLEMS (unusual diarrhea, constipation, pain near the anus) TENDERNESS IN MOUTH AND THROAT WITH OR WITHOUT PRESENCE OF ULCERS (sore throat, sores in mouth, or a toothache) UNUSUAL RASH, SWELLING OR PAIN  UNUSUAL VAGINAL DISCHARGE OR ITCHING   Items with * indicate a potential emergency and should be followed up as soon as possible or go to the Emergency Department if any problems should occur.  Please show the CHEMOTHERAPY ALERT CARD or IMMUNOTHERAPY ALERT CARD at  check-in to the Emergency Department and triage nurse.  Should you have questions after your visit or need to cancel or reschedule your appointment, please contact [facility] [phone]  and follow the prompts.  Office hours are 8:00 a.m. to 4:30 p.m. Monday - Friday. Please note that voicemails left after 4:00 p.m. may not be returned until the following business day.  We are closed weekends and major holidays. You have access to a nurse at all times for urgent questions. Please call the main number to the clinic [phone] and follow the prompts.  For any non-urgent questions, you may also contact your provider using MyChart. We now offer e-Visits for anyone 18 and older to request care online for non-urgent symptoms. For details visit [email or web address].   Also download the MyChart app! Go to the app store, search "MyChart", open the app, select [facility], and log in with your MyChart username and password.  Masks are optional in the cancer centers. If you would like for your care team to wear a mask while they are taking care of you, please let them know. You may have one support person who is at least 86 years old accompany you for your appointments.  

## 2021-12-14 NOTE — Telephone Encounter (Signed)
Chart reviewed. Revlimid refilled per last office note with Dr. Katragadda.  

## 2021-12-15 LAB — BETA 2 MICROGLOBULIN, SERUM: Beta-2 Microglobulin: 3.2 mg/L — ABNORMAL HIGH (ref 0.6–2.4)

## 2021-12-16 ENCOUNTER — Other Ambulatory Visit: Payer: Self-pay

## 2021-12-22 DIAGNOSIS — M130 Polyarthritis, unspecified: Secondary | ICD-10-CM | POA: Diagnosis not present

## 2021-12-22 DIAGNOSIS — D473 Essential (hemorrhagic) thrombocythemia: Secondary | ICD-10-CM | POA: Diagnosis not present

## 2021-12-22 DIAGNOSIS — R42 Dizziness and giddiness: Secondary | ICD-10-CM | POA: Diagnosis not present

## 2021-12-22 DIAGNOSIS — H547 Unspecified visual loss: Secondary | ICD-10-CM | POA: Diagnosis not present

## 2021-12-23 ENCOUNTER — Inpatient Hospital Stay: Payer: Medicare Other

## 2021-12-23 ENCOUNTER — Inpatient Hospital Stay: Payer: Medicare Other | Attending: Hematology

## 2021-12-23 ENCOUNTER — Inpatient Hospital Stay: Payer: Medicare Other | Admitting: Hematology

## 2021-12-23 VITALS — BP 145/66 | HR 88 | Temp 97.3°F | Resp 18 | Wt 146.0 lb

## 2021-12-23 DIAGNOSIS — Z7902 Long term (current) use of antithrombotics/antiplatelets: Secondary | ICD-10-CM | POA: Insufficient documentation

## 2021-12-23 DIAGNOSIS — E876 Hypokalemia: Secondary | ICD-10-CM | POA: Diagnosis not present

## 2021-12-23 DIAGNOSIS — N189 Chronic kidney disease, unspecified: Secondary | ICD-10-CM | POA: Diagnosis not present

## 2021-12-23 DIAGNOSIS — I739 Peripheral vascular disease, unspecified: Secondary | ICD-10-CM | POA: Insufficient documentation

## 2021-12-23 DIAGNOSIS — E7800 Pure hypercholesterolemia, unspecified: Secondary | ICD-10-CM | POA: Diagnosis not present

## 2021-12-23 DIAGNOSIS — Z79899 Other long term (current) drug therapy: Secondary | ICD-10-CM | POA: Diagnosis not present

## 2021-12-23 DIAGNOSIS — Z79624 Long term (current) use of inhibitors of nucleotide synthesis: Secondary | ICD-10-CM | POA: Insufficient documentation

## 2021-12-23 DIAGNOSIS — D509 Iron deficiency anemia, unspecified: Secondary | ICD-10-CM | POA: Insufficient documentation

## 2021-12-23 DIAGNOSIS — C9000 Multiple myeloma not having achieved remission: Secondary | ICD-10-CM | POA: Insufficient documentation

## 2021-12-23 DIAGNOSIS — Z5112 Encounter for antineoplastic immunotherapy: Secondary | ICD-10-CM | POA: Diagnosis not present

## 2021-12-23 DIAGNOSIS — Z7961 Long term (current) use of immunomodulator: Secondary | ICD-10-CM | POA: Diagnosis not present

## 2021-12-23 DIAGNOSIS — Z95828 Presence of other vascular implants and grafts: Secondary | ICD-10-CM

## 2021-12-23 DIAGNOSIS — Z7982 Long term (current) use of aspirin: Secondary | ICD-10-CM | POA: Diagnosis not present

## 2021-12-23 DIAGNOSIS — I10 Essential (primary) hypertension: Secondary | ICD-10-CM | POA: Insufficient documentation

## 2021-12-23 DIAGNOSIS — Z9221 Personal history of antineoplastic chemotherapy: Secondary | ICD-10-CM | POA: Diagnosis not present

## 2021-12-23 DIAGNOSIS — I129 Hypertensive chronic kidney disease with stage 1 through stage 4 chronic kidney disease, or unspecified chronic kidney disease: Secondary | ICD-10-CM | POA: Diagnosis not present

## 2021-12-23 LAB — CBC WITH DIFFERENTIAL/PLATELET
Abs Immature Granulocytes: 0.02 10*3/uL (ref 0.00–0.07)
Basophils Absolute: 0 10*3/uL (ref 0.0–0.1)
Basophils Relative: 0 %
Eosinophils Absolute: 0 10*3/uL (ref 0.0–0.5)
Eosinophils Relative: 1 %
HCT: 34.5 % — ABNORMAL LOW (ref 36.0–46.0)
Hemoglobin: 11.3 g/dL — ABNORMAL LOW (ref 12.0–15.0)
Immature Granulocytes: 1 %
Lymphocytes Relative: 50 %
Lymphs Abs: 1.7 10*3/uL (ref 0.7–4.0)
MCH: 32.2 pg (ref 26.0–34.0)
MCHC: 32.8 g/dL (ref 30.0–36.0)
MCV: 98.3 fL (ref 80.0–100.0)
Monocytes Absolute: 0.4 10*3/uL (ref 0.1–1.0)
Monocytes Relative: 12 %
Neutro Abs: 1.2 10*3/uL — ABNORMAL LOW (ref 1.7–7.7)
Neutrophils Relative %: 36 %
Platelets: 206 10*3/uL (ref 150–400)
RBC: 3.51 MIL/uL — ABNORMAL LOW (ref 3.87–5.11)
RDW: 22 % — ABNORMAL HIGH (ref 11.5–15.5)
WBC: 3.3 10*3/uL — ABNORMAL LOW (ref 4.0–10.5)
nRBC: 0 % (ref 0.0–0.2)

## 2021-12-23 MED ORDER — SODIUM CHLORIDE 0.9% FLUSH
10.0000 mL | Freq: Once | INTRAVENOUS | Status: AC
  Administered 2021-12-23: 10 mL via INTRAVENOUS

## 2021-12-23 MED ORDER — HEPARIN SOD (PORK) LOCK FLUSH 100 UNIT/ML IV SOLN
500.0000 [IU] | Freq: Once | INTRAVENOUS | Status: AC
  Administered 2021-12-23: 500 [IU] via INTRAVENOUS

## 2021-12-23 MED ORDER — DARATUMUMAB-HYALURONIDASE-FIHJ 1800-30000 MG-UT/15ML ~~LOC~~ SOLN
1800.0000 mg | Freq: Once | SUBCUTANEOUS | Status: AC
  Administered 2021-12-23: 1800 mg via SUBCUTANEOUS
  Filled 2021-12-23: qty 15

## 2021-12-23 MED ORDER — ACETAMINOPHEN 325 MG PO TABS
650.0000 mg | ORAL_TABLET | Freq: Once | ORAL | Status: AC
  Administered 2021-12-23: 650 mg via ORAL
  Filled 2021-12-23: qty 2

## 2021-12-23 MED ORDER — DIPHENHYDRAMINE HCL 25 MG PO CAPS
25.0000 mg | ORAL_CAPSULE | Freq: Once | ORAL | Status: AC
  Administered 2021-12-23: 25 mg via ORAL
  Filled 2021-12-23: qty 1

## 2021-12-23 MED ORDER — DEXAMETHASONE 4 MG PO TABS
20.0000 mg | ORAL_TABLET | Freq: Once | ORAL | Status: AC
  Administered 2021-12-23: 20 mg via ORAL
  Filled 2021-12-23: qty 5

## 2021-12-23 NOTE — Progress Notes (Signed)
Ok to treat with labs - ANC 1.2  T.O. Dr Rhys Martini, PharmD

## 2021-12-23 NOTE — Patient Instructions (Signed)
[facility]  Discharge Instructions: Thank you for choosing [facility] to provide your oncology and hematology care.  If you have a lab appointment with the [facility], please come in thru the Main Entrance and check in at the main information desk.  Wear comfortable clothing and clothing appropriate for easy access to any Portacath or PICC line.   We strive to give you quality time with your provider. You may need to reschedule your appointment if you arrive late (15 or more minutes).  Arriving late affects you and other patients whose appointments are after yours.  Also, if you miss three or more appointments without notifying the office, you may be dismissed from the clinic at the provider's discretion.      For prescription refill requests, have your pharmacy contact our office and allow 72 hours for refills to be completed.    Today you received the following chemotherapy and/or immunotherapy agents Dartumumab.  Daratumumab Injection What is this medication? DARATUMUMAB (dar a toom ue mab) treats multiple myeloma, a type of bone marrow cancer. It works by helping your immune system slow or stop the spread of cancer cells. It is a monoclonal antibody. This medicine may be used for other purposes; ask your health care provider or pharmacist if you have questions. COMMON BRAND NAME(S): DARZALEX What should I tell my care team before I take this medication? They need to know if you have any of these conditions: Hereditary fructose intolerance Infection, such as chickenpox, herpes, hepatitis B virus Lung or breathing disease, such as asthma, COPD An unusual or allergic reaction to daratumumab, sorbitol, other medications, foods, dyes, or preservatives Pregnant or trying to get pregnant Breast-feeding How should I use this medication? This medication is injected into a vein. It is given by your care team in a hospital or clinic setting. Talk to your  care team about the use of this medication in children. Special care may be needed. Overdosage: If you think you have taken too much of this medicine contact a poison control center or emergency room at once. NOTE: This medicine is only for you. Do not share this medicine with others. What if I miss a dose? Keep appointments for follow-up doses. It is important not to miss your dose. Call your care team if you are unable to keep an appointment. What may interact with this medication? Interactions have not been studied. This list may not describe all possible interactions. Give your health care provider a list of all the medicines, herbs, non-prescription drugs, or dietary supplements you use. Also tell them if you smoke, drink alcohol, or use illegal drugs. Some items may interact with your medicine. What should I watch for while using this medication? Your condition will be monitored carefully while you are receiving this medication. This medication can cause serious allergic reactions. To reduce your risk, your care team may give you other medication to take before receiving this one. Be sure to follow the directions from your care team. This medication can affect the results of blood tests to match your blood type. These changes can last for up to 6 months after the final dose. Your care team will do blood tests to match your blood type before you start treatment. Tell all of your care team that you are being treated with this medication before receiving a blood transfusion. This medication can affect the results of some tests used to determine treatment response; extra tests may be needed  to evaluate response. Talk to your care team if you wish to become pregnant or think you are pregnant. This medication can cause serious birth defects if taken during pregnancy and for 3 months after the last dose. A reliable form of contraception is recommended while taking this medication and for 3 months after  the last dose. Talk to your care team about effective forms of contraception. Do not breast-feed while taking this medication. What side effects may I notice from receiving this medication? Side effects that you should report to your care team as soon as possible: Allergic reactions--skin rash, itching, hives, swelling of the face, lips, tongue, or throat Infection--fever, chills, cough, sore throat, wounds that don't heal, pain or trouble when passing urine, general feeling of discomfort or being unwell Infusion reactions--chest pain, shortness of breath or trouble breathing, feeling faint or lightheaded Unusual bruising or bleeding Side effects that usually do not require medical attention (report to your care team if they continue or are bothersome): Constipation Diarrhea Fatigue Nausea Pain, tingling, or numbness in the hands or feet Swelling of the ankles, hands, or feet This list may not describe all possible side effects. Call your doctor for medical advice about side effects. You may report side effects to FDA at 1-800-FDA-1088. Where should I keep my medication? This medication is given in a hospital or clinic. It will not be stored at home. NOTE: This sheet is a summary. It may not cover all possible information. If you have questions about this medicine, talk to your doctor, pharmacist, or health care provider.  2023 Elsevier/Gold Standard (2021-06-03 00:00:00)        To help prevent nausea and vomiting after your treatment, we encourage you to take your nausea medication as directed.  BELOW ARE SYMPTOMS THAT SHOULD BE REPORTED IMMEDIATELY: *FEVER GREATER THAN 100.4 F (38 C) OR HIGHER *CHILLS OR SWEATING *NAUSEA AND VOMITING THAT IS NOT CONTROLLED WITH YOUR NAUSEA MEDICATION *UNUSUAL SHORTNESS OF BREATH *UNUSUAL BRUISING OR BLEEDING *URINARY PROBLEMS (pain or burning when urinating, or frequent urination) *BOWEL PROBLEMS (unusual diarrhea, constipation, pain near the  anus) TENDERNESS IN MOUTH AND THROAT WITH OR WITHOUT PRESENCE OF ULCERS (sore throat, sores in mouth, or a toothache) UNUSUAL RASH, SWELLING OR PAIN  UNUSUAL VAGINAL DISCHARGE OR ITCHING   Items with * indicate a potential emergency and should be followed up as soon as possible or go to the Emergency Department if any problems should occur.  Please show the CHEMOTHERAPY ALERT CARD or IMMUNOTHERAPY ALERT CARD at check-in to the Emergency Department and triage nurse.  Should you have questions after your visit or need to cancel or reschedule your appointment, please contact [facility] [phone]  and follow the prompts.  Office hours are 8:00 a.m. to 4:30 p.m. Monday - Friday. Please note that voicemails left after 4:00 p.m. may not be returned until the following business day.  We are closed weekends and major holidays. You have access to a nurse at all times for urgent questions. Please call the main number to the clinic [phone] and follow the prompts.  For any non-urgent questions, you may also contact your provider using MyChart. We now offer e-Visits for anyone 42 and older to request care online for non-urgent symptoms. For details visit [email or web address].   Also download the MyChart app! Go to the app store, search "MyChart", open the app, select , and log in with your MyChart username and password.  Masks are optional in the cancer  centers. If you would like for your care team to wear a mask while they are taking care of you, please let them know. You may have one support person who is at least 86 years old accompany you for your appointments.

## 2021-12-23 NOTE — Progress Notes (Signed)
Patient presents today for Dartumumab chemotherapy injection.  Patient is in satisfactory condition with no new complaints voiced.  Vital signs are stable. Labs reviewed.  ANC today is 1.2.  MD made aware.  All other labs are within treatment parameters.  We will proceed with treatment per MD orders.   Patient tolerated injection well with no complaints voiced.  Patient left ambulatory in stable condition.  Vital signs stable at discharge.  Follow up as scheduled.

## 2021-12-25 ENCOUNTER — Other Ambulatory Visit: Payer: Self-pay

## 2021-12-28 ENCOUNTER — Ambulatory Visit: Payer: Medicare Other | Admitting: Hematology

## 2021-12-28 ENCOUNTER — Ambulatory Visit: Payer: Medicare Other

## 2021-12-28 ENCOUNTER — Other Ambulatory Visit: Payer: Medicare Other

## 2021-12-29 ENCOUNTER — Other Ambulatory Visit: Payer: Self-pay

## 2021-12-30 ENCOUNTER — Inpatient Hospital Stay: Payer: Medicare Other

## 2021-12-30 ENCOUNTER — Other Ambulatory Visit: Payer: Self-pay

## 2021-12-30 VITALS — BP 153/69 | HR 70 | Temp 97.5°F | Resp 16 | Wt 148.3 lb

## 2021-12-30 DIAGNOSIS — C9000 Multiple myeloma not having achieved remission: Secondary | ICD-10-CM | POA: Diagnosis not present

## 2021-12-30 DIAGNOSIS — I739 Peripheral vascular disease, unspecified: Secondary | ICD-10-CM | POA: Diagnosis not present

## 2021-12-30 DIAGNOSIS — D509 Iron deficiency anemia, unspecified: Secondary | ICD-10-CM | POA: Diagnosis not present

## 2021-12-30 DIAGNOSIS — N189 Chronic kidney disease, unspecified: Secondary | ICD-10-CM | POA: Diagnosis not present

## 2021-12-30 DIAGNOSIS — Z79899 Other long term (current) drug therapy: Secondary | ICD-10-CM | POA: Diagnosis not present

## 2021-12-30 DIAGNOSIS — I10 Essential (primary) hypertension: Secondary | ICD-10-CM | POA: Diagnosis not present

## 2021-12-30 DIAGNOSIS — Z79624 Long term (current) use of inhibitors of nucleotide synthesis: Secondary | ICD-10-CM | POA: Diagnosis not present

## 2021-12-30 DIAGNOSIS — Z7982 Long term (current) use of aspirin: Secondary | ICD-10-CM | POA: Diagnosis not present

## 2021-12-30 DIAGNOSIS — E7800 Pure hypercholesterolemia, unspecified: Secondary | ICD-10-CM | POA: Diagnosis not present

## 2021-12-30 DIAGNOSIS — Z9221 Personal history of antineoplastic chemotherapy: Secondary | ICD-10-CM | POA: Diagnosis not present

## 2021-12-30 DIAGNOSIS — E876 Hypokalemia: Secondary | ICD-10-CM | POA: Diagnosis not present

## 2021-12-30 DIAGNOSIS — Z7902 Long term (current) use of antithrombotics/antiplatelets: Secondary | ICD-10-CM | POA: Diagnosis not present

## 2021-12-30 DIAGNOSIS — Z5112 Encounter for antineoplastic immunotherapy: Secondary | ICD-10-CM | POA: Diagnosis not present

## 2021-12-30 DIAGNOSIS — Z7961 Long term (current) use of immunomodulator: Secondary | ICD-10-CM | POA: Diagnosis not present

## 2021-12-30 DIAGNOSIS — I129 Hypertensive chronic kidney disease with stage 1 through stage 4 chronic kidney disease, or unspecified chronic kidney disease: Secondary | ICD-10-CM | POA: Diagnosis not present

## 2021-12-30 LAB — COMPREHENSIVE METABOLIC PANEL
ALT: 13 U/L (ref 0–44)
AST: 14 U/L — ABNORMAL LOW (ref 15–41)
Albumin: 3 g/dL — ABNORMAL LOW (ref 3.5–5.0)
Alkaline Phosphatase: 64 U/L (ref 38–126)
Anion gap: 7 (ref 5–15)
BUN: 17 mg/dL (ref 8–23)
CO2: 21 mmol/L — ABNORMAL LOW (ref 22–32)
Calcium: 8.4 mg/dL — ABNORMAL LOW (ref 8.9–10.3)
Chloride: 108 mmol/L (ref 98–111)
Creatinine, Ser: 1.13 mg/dL — ABNORMAL HIGH (ref 0.44–1.00)
GFR, Estimated: 47 mL/min — ABNORMAL LOW (ref >60)
Glucose, Bld: 138 mg/dL — ABNORMAL HIGH (ref 70–99)
Potassium: 3.9 mmol/L (ref 3.5–5.1)
Sodium: 136 mmol/L (ref 135–145)
Total Bilirubin: 0.5 mg/dL (ref 0.3–1.2)
Total Protein: 5.9 g/dL — ABNORMAL LOW (ref 6.5–8.1)

## 2021-12-30 LAB — CBC WITH DIFFERENTIAL/PLATELET
Abs Immature Granulocytes: 0.01 10*3/uL (ref 0.00–0.07)
Basophils Absolute: 0 10*3/uL (ref 0.0–0.1)
Basophils Relative: 0 %
Eosinophils Absolute: 0 10*3/uL (ref 0.0–0.5)
Eosinophils Relative: 0 %
HCT: 34.6 % — ABNORMAL LOW (ref 36.0–46.0)
Hemoglobin: 11.2 g/dL — ABNORMAL LOW (ref 12.0–15.0)
Immature Granulocytes: 0 %
Lymphocytes Relative: 42 %
Lymphs Abs: 2 10*3/uL (ref 0.7–4.0)
MCH: 31.9 pg (ref 26.0–34.0)
MCHC: 32.4 g/dL (ref 30.0–36.0)
MCV: 98.6 fL (ref 80.0–100.0)
Monocytes Absolute: 0.9 10*3/uL (ref 0.1–1.0)
Monocytes Relative: 19 %
Neutro Abs: 2 10*3/uL (ref 1.7–7.7)
Neutrophils Relative %: 39 %
Platelets: 360 10*3/uL (ref 150–400)
RBC: 3.51 MIL/uL — ABNORMAL LOW (ref 3.87–5.11)
RDW: 21.6 % — ABNORMAL HIGH (ref 11.5–15.5)
WBC: 5 10*3/uL (ref 4.0–10.5)
nRBC: 0.6 % — ABNORMAL HIGH (ref 0.0–0.2)

## 2021-12-30 LAB — MAGNESIUM: Magnesium: 1.7 mg/dL (ref 1.7–2.4)

## 2021-12-30 MED ORDER — HEPARIN SOD (PORK) LOCK FLUSH 100 UNIT/ML IV SOLN
500.0000 [IU] | Freq: Once | INTRAVENOUS | Status: AC
  Administered 2021-12-30: 500 [IU] via INTRAVENOUS

## 2021-12-30 MED ORDER — DIPHENHYDRAMINE HCL 25 MG PO CAPS
25.0000 mg | ORAL_CAPSULE | Freq: Once | ORAL | Status: AC
  Administered 2021-12-30: 25 mg via ORAL
  Filled 2021-12-30: qty 1

## 2021-12-30 MED ORDER — ACETAMINOPHEN 325 MG PO TABS
650.0000 mg | ORAL_TABLET | Freq: Once | ORAL | Status: AC
  Administered 2021-12-30: 650 mg via ORAL
  Filled 2021-12-30: qty 2

## 2021-12-30 MED ORDER — SODIUM CHLORIDE 0.9% FLUSH
10.0000 mL | INTRAVENOUS | Status: DC | PRN
  Administered 2021-12-30: 10 mL via INTRAVENOUS

## 2021-12-30 MED ORDER — DARATUMUMAB-HYALURONIDASE-FIHJ 1800-30000 MG-UT/15ML ~~LOC~~ SOLN
1800.0000 mg | Freq: Once | SUBCUTANEOUS | Status: AC
  Administered 2021-12-30: 1800 mg via SUBCUTANEOUS
  Filled 2021-12-30: qty 15

## 2021-12-30 MED ORDER — DEXAMETHASONE 4 MG PO TABS
20.0000 mg | ORAL_TABLET | Freq: Once | ORAL | Status: AC
  Administered 2021-12-30: 20 mg via ORAL
  Filled 2021-12-30: qty 5

## 2021-12-30 MED ORDER — ACYCLOVIR 400 MG PO TABS: 400.0000 mg | ORAL_TABLET | Freq: Two times a day (BID) | ORAL | 6 refills | Status: DC

## 2021-12-30 NOTE — Patient Instructions (Signed)
[facility]  Discharge Instructions: Thank you for choosing [facility] to provide your oncology and hematology care.  If you have a lab appointment with the [facility], please come in thru the Main Entrance and check in at the main information desk.  Wear comfortable clothing and clothing appropriate for easy access to any Portacath or PICC line.   We strive to give you quality time with your provider. You may need to reschedule your appointment if you arrive late (15 or more minutes).  Arriving late affects you and other patients whose appointments are after yours.  Also, if you miss three or more appointments without notifying the office, you may be dismissed from the clinic at the provider's discretion.      For prescription refill requests, have your pharmacy contact our office and allow 72 hours for refills to be completed.    Today you received the following chemotherapy and/or immunotherapy agents Daratumumab.   Daratumumab Injection What is this medication? DARATUMUMAB (dar a toom ue mab) treats multiple myeloma, a type of bone marrow cancer. It works by helping your immune system slow or stop the spread of cancer cells. It is a monoclonal antibody. This medicine may be used for other purposes; ask your health care provider or pharmacist if you have questions. COMMON BRAND NAME(S): DARZALEX What should I tell my care team before I take this medication? They need to know if you have any of these conditions: Hereditary fructose intolerance Infection, such as chickenpox, herpes, hepatitis B virus Lung or breathing disease, such as asthma, COPD An unusual or allergic reaction to daratumumab, sorbitol, other medications, foods, dyes, or preservatives Pregnant or trying to get pregnant Breast-feeding How should I use this medication? This medication is injected into a vein. It is given by your care team in a hospital or clinic setting. Talk to your  care team about the use of this medication in children. Special care may be needed. Overdosage: If you think you have taken too much of this medicine contact a poison control center or emergency room at once. NOTE: This medicine is only for you. Do not share this medicine with others. What if I miss a dose? Keep appointments for follow-up doses. It is important not to miss your dose. Call your care team if you are unable to keep an appointment. What may interact with this medication? Interactions have not been studied. This list may not describe all possible interactions. Give your health care provider a list of all the medicines, herbs, non-prescription drugs, or dietary supplements you use. Also tell them if you smoke, drink alcohol, or use illegal drugs. Some items may interact with your medicine. What should I watch for while using this medication? Your condition will be monitored carefully while you are receiving this medication. This medication can cause serious allergic reactions. To reduce your risk, your care team may give you other medication to take before receiving this one. Be sure to follow the directions from your care team. This medication can affect the results of blood tests to match your blood type. These changes can last for up to 6 months after the final dose. Your care team will do blood tests to match your blood type before you start treatment. Tell all of your care team that you are being treated with this medication before receiving a blood transfusion. This medication can affect the results of some tests used to determine treatment response; extra tests may be  needed to evaluate response. Talk to your care team if you wish to become pregnant or think you are pregnant. This medication can cause serious birth defects if taken during pregnancy and for 3 months after the last dose. A reliable form of contraception is recommended while taking this medication and for 3 months after  the last dose. Talk to your care team about effective forms of contraception. Do not breast-feed while taking this medication. What side effects may I notice from receiving this medication? Side effects that you should report to your care team as soon as possible: Allergic reactions--skin rash, itching, hives, swelling of the face, lips, tongue, or throat Infection--fever, chills, cough, sore throat, wounds that don't heal, pain or trouble when passing urine, general feeling of discomfort or being unwell Infusion reactions--chest pain, shortness of breath or trouble breathing, feeling faint or lightheaded Unusual bruising or bleeding Side effects that usually do not require medical attention (report to your care team if they continue or are bothersome): Constipation Diarrhea Fatigue Nausea Pain, tingling, or numbness in the hands or feet Swelling of the ankles, hands, or feet This list may not describe all possible side effects. Call your doctor for medical advice about side effects. You may report side effects to FDA at 1-800-FDA-1088. Where should I keep my medication? This medication is given in a hospital or clinic. It will not be stored at home. NOTE: This sheet is a summary. It may not cover all possible information. If you have questions about this medicine, talk to your doctor, pharmacist, or health care provider.  2023 Elsevier/Gold Standard (2021-06-03 00:00:00)          To help prevent nausea and vomiting after your treatment, we encourage you to take your nausea medication as directed.  BELOW ARE SYMPTOMS THAT SHOULD BE REPORTED IMMEDIATELY: *FEVER GREATER THAN 100.4 F (38 C) OR HIGHER *CHILLS OR SWEATING *NAUSEA AND VOMITING THAT IS NOT CONTROLLED WITH YOUR NAUSEA MEDICATION *UNUSUAL SHORTNESS OF BREATH *UNUSUAL BRUISING OR BLEEDING *URINARY PROBLEMS (pain or burning when urinating, or frequent urination) *BOWEL PROBLEMS (unusual diarrhea, constipation, pain near the  anus) TENDERNESS IN MOUTH AND THROAT WITH OR WITHOUT PRESENCE OF ULCERS (sore throat, sores in mouth, or a toothache) UNUSUAL RASH, SWELLING OR PAIN  UNUSUAL VAGINAL DISCHARGE OR ITCHING   Items with * indicate a potential emergency and should be followed up as soon as possible or go to the Emergency Department if any problems should occur.  Please show the CHEMOTHERAPY ALERT CARD or IMMUNOTHERAPY ALERT CARD at check-in to the Emergency Department and triage nurse.  Should you have questions after your visit or need to cancel or reschedule your appointment, please contact [facility] [phone]  and follow the prompts.  Office hours are 8:00 a.m. to 4:30 p.m. Monday - Friday. Please note that voicemails left after 4:00 p.m. may not be returned until the following business day.  We are closed weekends and major holidays. You have access to a nurse at all times for urgent questions. Please call the main number to the clinic [phone] and follow the prompts.  For any non-urgent questions, you may also contact your provider using MyChart. We now offer e-Visits for anyone 64 and older to request care online for non-urgent symptoms. For details visit [email or web address].   Also download the MyChart app! Go to the app store, search "MyChart", open the app, select [facility], and log in with your MyChart username and password.  Masks are optional  in the cancer centers. If you would like for your care team to wear a mask while they are taking care of you, please let them know. You may have one support person who is at least 86 years old accompany you for your appointments.

## 2021-12-30 NOTE — Progress Notes (Addendum)
Patients port flushed without difficulty.  Good blood return noted with no bruising or swelling noted at site.  Stable during access and blood draw.  Patient remains accessed for treatment.

## 2021-12-30 NOTE — Progress Notes (Signed)
Patient presents today for Daratumumab injection.  Patient is in satisfactory condition with no new complaints voiced. Vital signs are stable.  Labs reviewed.  All labs are within treatment parameters.  We will proceed with injection per MD orders.   Patient tolerated injection with no complaints voiced.  Site clean and dry with no bruising or swelling noted.  No complaints of pain.  Discharged ambulatory with vital signs stable and no signs or symptoms of distress noted.

## 2022-01-01 ENCOUNTER — Other Ambulatory Visit: Payer: Self-pay

## 2022-01-04 ENCOUNTER — Inpatient Hospital Stay: Payer: Medicare Other | Admitting: Hematology

## 2022-01-04 ENCOUNTER — Inpatient Hospital Stay: Payer: Medicare Other

## 2022-01-06 ENCOUNTER — Inpatient Hospital Stay: Payer: Medicare Other | Admitting: Hematology

## 2022-01-06 ENCOUNTER — Other Ambulatory Visit: Payer: Self-pay

## 2022-01-06 ENCOUNTER — Other Ambulatory Visit: Payer: Self-pay | Admitting: *Deleted

## 2022-01-06 ENCOUNTER — Inpatient Hospital Stay: Payer: Medicare Other

## 2022-01-06 VITALS — BP 127/52 | HR 81 | Temp 98.3°F | Resp 18

## 2022-01-06 DIAGNOSIS — N189 Chronic kidney disease, unspecified: Secondary | ICD-10-CM | POA: Diagnosis not present

## 2022-01-06 DIAGNOSIS — Z7982 Long term (current) use of aspirin: Secondary | ICD-10-CM | POA: Diagnosis not present

## 2022-01-06 DIAGNOSIS — Z9221 Personal history of antineoplastic chemotherapy: Secondary | ICD-10-CM | POA: Diagnosis not present

## 2022-01-06 DIAGNOSIS — I10 Essential (primary) hypertension: Secondary | ICD-10-CM | POA: Diagnosis not present

## 2022-01-06 DIAGNOSIS — C9000 Multiple myeloma not having achieved remission: Secondary | ICD-10-CM | POA: Diagnosis not present

## 2022-01-06 DIAGNOSIS — Z7902 Long term (current) use of antithrombotics/antiplatelets: Secondary | ICD-10-CM | POA: Diagnosis not present

## 2022-01-06 DIAGNOSIS — Z95828 Presence of other vascular implants and grafts: Secondary | ICD-10-CM

## 2022-01-06 DIAGNOSIS — I739 Peripheral vascular disease, unspecified: Secondary | ICD-10-CM | POA: Diagnosis not present

## 2022-01-06 DIAGNOSIS — E876 Hypokalemia: Secondary | ICD-10-CM

## 2022-01-06 DIAGNOSIS — I129 Hypertensive chronic kidney disease with stage 1 through stage 4 chronic kidney disease, or unspecified chronic kidney disease: Secondary | ICD-10-CM | POA: Diagnosis not present

## 2022-01-06 DIAGNOSIS — Z5112 Encounter for antineoplastic immunotherapy: Secondary | ICD-10-CM | POA: Diagnosis not present

## 2022-01-06 DIAGNOSIS — Z79899 Other long term (current) drug therapy: Secondary | ICD-10-CM | POA: Diagnosis not present

## 2022-01-06 DIAGNOSIS — E7800 Pure hypercholesterolemia, unspecified: Secondary | ICD-10-CM | POA: Diagnosis not present

## 2022-01-06 DIAGNOSIS — Z79624 Long term (current) use of inhibitors of nucleotide synthesis: Secondary | ICD-10-CM | POA: Diagnosis not present

## 2022-01-06 DIAGNOSIS — Z7961 Long term (current) use of immunomodulator: Secondary | ICD-10-CM | POA: Diagnosis not present

## 2022-01-06 DIAGNOSIS — D509 Iron deficiency anemia, unspecified: Secondary | ICD-10-CM | POA: Diagnosis not present

## 2022-01-06 LAB — CBC WITH DIFFERENTIAL/PLATELET
Abs Immature Granulocytes: 0.01 10*3/uL (ref 0.00–0.07)
Basophils Absolute: 0 10*3/uL (ref 0.0–0.1)
Basophils Relative: 1 %
Eosinophils Absolute: 0 10*3/uL (ref 0.0–0.5)
Eosinophils Relative: 0 %
HCT: 34.9 % — ABNORMAL LOW (ref 36.0–46.0)
Hemoglobin: 11.4 g/dL — ABNORMAL LOW (ref 12.0–15.0)
Immature Granulocytes: 0 %
Lymphocytes Relative: 45 %
Lymphs Abs: 1.9 10*3/uL (ref 0.7–4.0)
MCH: 31.9 pg (ref 26.0–34.0)
MCHC: 32.7 g/dL (ref 30.0–36.0)
MCV: 97.8 fL (ref 80.0–100.0)
Monocytes Absolute: 0.6 10*3/uL (ref 0.1–1.0)
Monocytes Relative: 13 %
Neutro Abs: 1.7 10*3/uL (ref 1.7–7.7)
Neutrophils Relative %: 41 %
Platelets: 417 10*3/uL — ABNORMAL HIGH (ref 150–400)
RBC: 3.57 MIL/uL — ABNORMAL LOW (ref 3.87–5.11)
RDW: 20.9 % — ABNORMAL HIGH (ref 11.5–15.5)
WBC: 4.2 10*3/uL (ref 4.0–10.5)
nRBC: 0.5 % — ABNORMAL HIGH (ref 0.0–0.2)

## 2022-01-06 LAB — COMPREHENSIVE METABOLIC PANEL
ALT: 16 U/L (ref 0–44)
AST: 14 U/L — ABNORMAL LOW (ref 15–41)
Albumin: 3.1 g/dL — ABNORMAL LOW (ref 3.5–5.0)
Alkaline Phosphatase: 71 U/L (ref 38–126)
Anion gap: 7 (ref 5–15)
BUN: 14 mg/dL (ref 8–23)
CO2: 22 mmol/L (ref 22–32)
Calcium: 8.5 mg/dL — ABNORMAL LOW (ref 8.9–10.3)
Chloride: 109 mmol/L (ref 98–111)
Creatinine, Ser: 1 mg/dL (ref 0.44–1.00)
GFR, Estimated: 54 mL/min — ABNORMAL LOW (ref >60)
Glucose, Bld: 97 mg/dL (ref 70–99)
Potassium: 3.6 mmol/L (ref 3.5–5.1)
Sodium: 138 mmol/L (ref 135–145)
Total Bilirubin: 0.4 mg/dL (ref 0.3–1.2)
Total Protein: 5.8 g/dL — ABNORMAL LOW (ref 6.5–8.1)

## 2022-01-06 LAB — MAGNESIUM: Magnesium: 1.9 mg/dL (ref 1.7–2.4)

## 2022-01-06 MED ORDER — POTASSIUM CHLORIDE CRYS ER 20 MEQ PO TBCR: 40.0000 meq | EXTENDED_RELEASE_TABLET | Freq: Every day | ORAL | 4 refills | Status: DC

## 2022-01-06 MED ORDER — DIPHENHYDRAMINE HCL 25 MG PO CAPS
25.0000 mg | ORAL_CAPSULE | Freq: Once | ORAL | Status: AC
  Administered 2022-01-06: 25 mg via ORAL
  Filled 2022-01-06: qty 1

## 2022-01-06 MED ORDER — DEXAMETHASONE 4 MG PO TABS
20.0000 mg | ORAL_TABLET | Freq: Once | ORAL | Status: AC
  Administered 2022-01-06: 20 mg via ORAL
  Filled 2022-01-06: qty 5

## 2022-01-06 MED ORDER — ACETAMINOPHEN 325 MG PO TABS
650.0000 mg | ORAL_TABLET | Freq: Once | ORAL | Status: AC
  Administered 2022-01-06: 650 mg via ORAL
  Filled 2022-01-06: qty 2

## 2022-01-06 MED ORDER — SODIUM CHLORIDE 0.9% FLUSH
10.0000 mL | Freq: Once | INTRAVENOUS | Status: AC
  Administered 2022-01-06: 10 mL via INTRAVENOUS

## 2022-01-06 MED ORDER — HEPARIN SOD (PORK) LOCK FLUSH 100 UNIT/ML IV SOLN
500.0000 [IU] | Freq: Once | INTRAVENOUS | Status: AC
  Administered 2022-01-06: 500 [IU] via INTRAVENOUS

## 2022-01-06 MED ORDER — DARATUMUMAB-HYALURONIDASE-FIHJ 1800-30000 MG-UT/15ML ~~LOC~~ SOLN
1800.0000 mg | Freq: Once | SUBCUTANEOUS | Status: AC
  Administered 2022-01-06: 1800 mg via SUBCUTANEOUS
  Filled 2022-01-06: qty 15

## 2022-01-06 NOTE — Progress Notes (Signed)
[place], [place] [address]   CLINIC:  Medical Oncology/Hematology  PCP:  Carrolyn Meiers, MD [place]  [phone]   REASON FOR VISIT:  Follow-up for newly diagnosed multiple myeloma  PRIOR THERAPY: None  NGS Results: Not applicable  CURRENT THERAPY: Daratumumab, Revlimid and dexamethasone  BRIEF ONCOLOGIC HISTORY:  Oncology History  NHL (non-Hodgkin's lymphoma) ([facility])  07/19/1995 Pathology Results   Spleen biopsy with resection- low-grade B-cell lymphoma, splenic marginal zone type   02/20/1998 Imaging   CT CAP-marked and extensive cervical adenopathy   02/25/1998 Pathology Results   Right cervical lymph node demonstrating large B-cell lymphoma   03/05/1998 Bone Marrow Biopsy   No evidence of bone marrow involvement   03/11/1998 - 07/18/1998 Chemotherapy   CHOP x 7 cycles   05/09/1998 Imaging   CT CAP and neck- slight interval decrease in size of para-aortic adenopathy with interval decrease in right inguinal and bi-iliac adenopathy.  Interval decrease in size and number of enlarged cervical nodes   07/10/1998 Imaging   Ct CAP and neck- unremarkable CT of chest. No enlarged retroperitoneal adenopathy withthe previously noted retroperitoneal nodes currently smaller to stable in size.  Cervical adenopathy is stable to slightly smaller in size.   08/18/1998 - 09/08/1998 Chemotherapy   Rituxan Day 1, 8, 15, 22 x 1 cycle   01/08/1999 Remission   CT CAP and neck demonstrates no adenopathy or disease   01/08/1999 Imaging   CT CAP and neck- Stable CT of chest. Stable CT abd/pelvis without adenopathy.  Negative CT of neck   Multiple myeloma ([facility])  11/16/2021 Initial Diagnosis   Multiple myeloma ([facility])   11/30/2021 -  Chemotherapy   Patient is on Treatment Plan : MYELOMA  Daratumumab SQ + Lenalidomide + Dexamethasone (DaraRd) q28d       CANCER STAGING:  Cancer Staging  Multiple myeloma  ([facility]) Staging form: Plasma Cell Myeloma and Plasma Cell Disorders, AJCC 8th Edition - Clinical stage from 11/16/2021: Albumin (g/dL): 3.1, ISS: Stage II, High-risk cytogenetics: Absent, LDH: Normal - Unsigned   INTERVAL HISTORY:  Ms. Marion 88 y.o. female seen for follow-up and toxicity assessment prior to next treatment.  She is tolerating lenalidomide very well.  She is also tolerating Darzalex very well.  Energy levels are reported as 100%.  No fevers or infections reported.  REVIEW OF SYSTEMS:  Review of Systems  All other systems reviewed and are negative.    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   Vertigo, labyrinthine    Past Surgical History:  Procedure Laterality Date   CATARACT EXTRACTION W/PHACO Left 11/03/2015   Procedure: CATARACT EXTRACTION PHACO AND INTRAOCULAR LENS PLACEMENT ([facility]);  Surgeon: Tonny Branch, MD;  Location: [facility];  Service: Ophthalmology;  Laterality: Left;  CDE: 7.74   CATARACT EXTRACTION W/PHACO Right 11/20/2015   Procedure: CATARACT  EXTRACTION PHACO AND INTRAOCULAR LENS PLACEMENT ; CDE:  8.30;  Surgeon: Tonny Branch, MD;  Location: [facility];  Service: Ophthalmology;  Laterality: Right;   IR IMAGING GUIDED PORT INSERTION  11/04/2021   PORT-A-CATH REMOVAL       SOCIAL HISTORY:  Social History   Socioeconomic History   Marital status: Widowed    Spouse name: Not on file   Number of children: Not on file   Years of education: Not  on file   Highest education level: Not on file  Occupational History   Not on file  Tobacco Use   Smoking status: Former   Smokeless tobacco: Never  Vaping Use   Vaping Use: Never used  Substance and Sexual Activity   Alcohol use: No   Drug use: No   Sexual activity: Not Currently  Other Topics Concern   Not on file  Social History Narrative   Not on file   Social Determinants of Health   Financial Resource Strain: Low Risk  (12/31/2019)   Overall Financial Resource Strain (CARDIA)    Difficulty of Paying Living Expenses: Not hard at all  Food Insecurity: No Food Insecurity (12/31/2019)   Hunger Vital Sign    Worried About Running Out of Food in the Last Year: Never true    [place] in the Last Year: Never true  Transportation Needs: No Transportation Needs (12/31/2019)   PRAPARE - Hydrologist (Medical): No    Lack of Transportation (Non-Medical): No  Physical Activity: Inactive (12/31/2019)   Exercise Vital Sign    Days of Exercise per Week: 0 days    Minutes of Exercise per Session: 0 min  Stress: No Stress Concern Present (12/31/2019)   [place]    Feeling of Stress : Not at all  Social Connections: Moderately Isolated (12/31/2019)   Social Connection and Isolation Panel [NHANES]    Frequency of Communication with Friends and Family: More than three times a week    Frequency of Social Gatherings with Friends and Family: More than three times a week    Attends Religious Services: More than 4 times per year    Active Member of [employer] or Organizations: No    Attends Archivist Meetings: Never    Marital Status: Widowed  Intimate Partner Violence: Not At Risk (12/31/2019)   Humiliation, Afraid, Rape, and Kick questionnaire    Fear of Current or Ex-Partner: No    Emotionally Abused: No    Physically Abused: No    Sexually Abused: No    FAMILY HISTORY:  Family  History  Problem Relation Age of Onset   Diabetes Mother     CURRENT MEDICATIONS:  Outpatient Encounter Medications as of 01/06/2022  Medication Sig   acyclovir (ZOVIRAX) 400 MG tablet Take 1 tablet (400 mg total) by mouth 2 (two) times daily.   amLODipine (NORVASC) 5 MG tablet Take 1 tablet (5 mg total) by mouth daily.   aspirin 81 MG tablet Take 81 mg by mouth daily.   atorvastatin (LIPITOR) 40 MG tablet Take 40 mg by mouth daily.   busPIRone (BUSPAR) 5 MG tablet Take 5 mg by mouth at bedtime as needed (anxiety/sleep).   cilostazol (PLETAL) 100 MG tablet Take 100 mg by mouth 2 (two) times daily.   ferrous sulfate 325 (65 FE) MG EC tablet Take 1 tablet (325 mg total) by mouth daily with breakfast.   lenalidomide (REVLIMID)  15 MG capsule Take 1 capsule (15 mg total) by mouth daily. Take for 21 days on, 7 days off.   lidocaine-prilocaine (EMLA) cream Apply 1 Application topically as needed.   potassium chloride SA (KLOR-CON M) 20 MEQ tablet Take 2 tablets (40 mEq total) by mouth daily. Take 2 tablets by mouth every 4 hours x 2 today; then take 2 tablets by mouth tomorrow morning   prochlorperazine (COMPAZINE) 10 MG tablet Take by mouth.   prochlorperazine (COMPAZINE) 5 MG tablet Take 1 tablet (5 mg total) by mouth every 6 (six) hours as needed for nausea or vomiting.   traZODone (DESYREL) 50 MG tablet Take 50 mg by mouth at bedtime as needed.   [EXPIRED] sodium chloride flush (NS) 0.9 % injection 10 mL    No facility-administered encounter medications on file as of 01/06/2022.    ALLERGIES:  Allergies  Allergen Reactions   Penicillins Rash     PHYSICAL EXAM:  ECOG Performance status: 1  There were no vitals filed for this visit.  There were no vitals filed for this visit.  Physical Exam Vitals reviewed.  Constitutional:      Appearance: Normal appearance.  Cardiovascular:     Rate and Rhythm: Normal rate and regular rhythm.     Heart sounds: Normal heart sounds.   Pulmonary:     Breath sounds: Normal breath sounds.  Abdominal:     Palpations: Abdomen is soft. There is no mass.  Neurological:     General: No focal deficit present.     Mental Status: She is alert and oriented to person, place, and time.  Psychiatric:        Mood and Affect: Mood normal.        Behavior: Behavior normal.     LABORATORY DATA:  I have reviewed the labs as listed.  CBC    Component Value Date/Time   WBC 5.0 12/30/2021 0752   RBC 3.51 (L) 12/30/2021 0752   HGB 11.2 (L) 12/30/2021 0752   HCT 34.6 (L) 12/30/2021 0752   PLT 360 12/30/2021 0752   MCV 98.6 12/30/2021 0752   MCH 31.9 12/30/2021 0752   MCHC 32.4 12/30/2021 0752   RDW 21.6 (H) 12/30/2021 0752   LYMPHSABS 2.0 12/30/2021 0752   MONOABS 0.9 12/30/2021 0752   EOSABS 0.0 12/30/2021 0752   BASOSABS 0.0 12/30/2021 0752      Latest Ref Rng & Units 12/30/2021    7:52 AM 12/14/2021   11:24 AM 12/07/2021    9:32 AM  CMP  Glucose 70 - 99 mg/dL 138  85  99   BUN 8 - 23 mg/dL _0 Creatinine 0.44 - 1.00 mg/dL 1.13  1.15  0.94   Sodium 135 - 145 mmol/L 136  137  138   Potassium 3.5 - 5.1 mmol/L 3.9  4.2  2.7   Chloride 98 - 111 mmol/L 108  104  106   CO2 22 - 32 mmol/L _1 Calcium 8.9 - 10.3 mg/dL 8.4  8.6  8.3   Total Protein 6.5 - 8.1 g/dL 5.9  6.6  6.6   Total Bilirubin 0.3 - 1.2 mg/dL 0.5  0.5  0.5   Alkaline Phos 38 - 126 U/L 64  54  48   AST 15 - 41 U/L _2 ALT 0 - 44 U/L _3 DIAGNOSTIC IMAGING:  I  have independently reviewed the scans and discussed with the patient.  ASSESSMENT:  1.  Biclonal IgG lambda and IgM kappa multiple myeloma, high risk: - BMBX (11/02/2021): IgG lambda plasma cells 70% - Myeloma FISH panel: Monosomy 13 (standard GIST), duplication of 1 q. (high risk), gain of 11 q. (standard risk). - Cytogenetics: Hyperdiploid E weight gain (trisomy/trisomy) of odd-numbered chromosomes and monosomy 13, standard risk.  Gain of 1 q. associated  with progressive course. - PET scan (11/05/2021): No evidence of multiple myeloma.  No hypermetabolic adenopathy. - Daratumumab, Revlimid and dexamethasone cycle 1 started on 11/30/2021  2.  JAK2 positive essential thrombocytosis: - BMBX (11/02/2021): No evidence of fibrosis.  No morphological evidence of JAK2 positive ET.  3.  History of marginal zone lymphoma of the spleen: - Diagnosed in 1999, status post CHOP x7 cycles followed by rituximab 1 cycle. - Status post splenectomy.  No evidence of lymphoma on current PET scan.  4.  Social/family history: - She lives with her sister.  Son lives 30 minutes away.  She is independent of ADLs and some IADLs.  She drives.  She worked at [employer] and denies any exposure to chemicals or pesticides.  No family history of malignancies.    PLAN:  1.  IgG lambda multiple myeloma: - She is tolerating Revlimid and dexamethasone very well. - Reviewed labs today which showed normal LFTs with low albumin at 3.1.  Creatinine is normal at 1.0.  CBC was grossly normal. - We have sent myeloma panel today which is pretty. - Proceed with cycle 2-day 8 today.  Continue Revlimid 15 mg 2 weeks on/1 week off and dexamethasone 20 mg weekly. - RTC 3 weeks for follow-up with repeat labs.  We will also repeat serum immunofixation at that time as she has biclonal disease in the past.  2.  Anemia from CKD and iron deficiency: - Status post Venofer x3 completed on 11/13/2021. - Hemoglobin  improved to 11.4.  3.  ID prophylaxis: - Continue acyclovir twice daily.  Continue aspirin 81 mg daily.  4.  Bone protection: - We will consider DEXA scan and denosumab in the future.  5.  Hypokalemia: - Potassium is 3.6 today.  Continue potassium 40 mEq daily.      Orders placed this encounter:  No orders of the defined types were placed in this encounter.     Derek Jack, MD [facility] [phone]

## 2022-01-06 NOTE — Patient Instructions (Addendum)
[facility] at [facility] Discharge Instructions   You were seen and examined today by Dr. Delton Coombes.  He reviewed the results of your lab work which are normal/stable. Your myeloma lab results are pending.   We will proceed with your treatment today.   Continue Revlimid as prescribed.   Return as scheduled.    Thank you for choosing [facility] at [facility] to provide your oncology and hematology care.  To afford each patient quality time with our provider, please arrive at least 15 minutes before your scheduled appointment time.   If you have a lab appointment with the [facility] please come in thru the Main Entrance and check in at the main information desk.  You need to re-schedule your appointment should you arrive 10 or more minutes late.  We strive to give you quality time with our providers, and arriving late affects you and other patients whose appointments are after yours.  Also, if you no show three or more times for appointments you may be dismissed from the clinic at the providers discretion.     Again, thank you for choosing [facility].  Our hope is that these requests will decrease the amount of time that you wait before being seen by our physicians.       _____________________________________________________________  Should you have questions after your visit to [facility], please contact our office at [phone] and follow the prompts.  Our office hours are 8:00 a.m. and 4:30 p.m. Monday - Friday.  Please note that voicemails left after 4:00 p.m. may not be returned until the following business day.  We are closed weekends and major holidays.  You do have access to a nurse 24-7, just call the main number to the clinic [phone] and do not press any options, hold on the line and a nurse will answer the phone.    For prescription refill requests, have your pharmacy contact our office and  allow 72 hours.    Due to Covid, you will need to wear a mask upon entering the hospital. If you do not have a mask, a mask will be given to you at the Main Entrance upon arrival. For doctor visits, patients may have 1 support person age 34 or older with them. For treatment visits, patients can not have anyone with them due to social distancing guidelines and our immunocompromised population.

## 2022-01-06 NOTE — Progress Notes (Signed)
Patient presents today for Daratumumab injection.  Patient is in satisfactory condition with no new complaints voiced.  Vital signs are stable.  Labs reviewed by Dr. Delton Coombes during her office visit.  All labs are within treatment parameters.  We will proceed with injection per MD orders.   Patient tolerated Dartumumab injection with no complaints voiced.  Site clean and dry with no bruising or swelling noted.  No complaints of pain.  Discharged with vital signs stable and no signs or symptoms of distress noted.

## 2022-01-06 NOTE — Progress Notes (Signed)
Patient is taking Revlimid as prescribed.  She has not missed any doses and reports no side effects at this time.    Patient has been examined by Dr. Katragadda, and vital signs and labs have been reviewed. ANC, Creatinine, LFTs, hemoglobin, and platelets are within treatment parameters per M.D. - pt may proceed with treatment.  Primary RN and pharmacy notified.  

## 2022-01-06 NOTE — Patient Instructions (Signed)
[facility]  Discharge Instructions: Thank you for choosing [facility] to provide your oncology and hematology care.  If you have a lab appointment with the [facility], please come in thru the Main Entrance and check in at the main information desk.  Wear comfortable clothing and clothing appropriate for easy access to any Portacath or PICC line.   We strive to give you quality time with your provider. You may need to reschedule your appointment if you arrive late (15 or more minutes).  Arriving late affects you and other patients whose appointments are after yours.  Also, if you miss three or more appointments without notifying the office, you may be dismissed from the clinic at the provider's discretion.      For prescription refill requests, have your pharmacy contact our office and allow 72 hours for refills to be completed.    Today you received the following chemotherapy and/or immunotherapy agents Dartumumab.  Daratumumab Injection What is this medication? DARATUMUMAB (dar a toom ue mab) treats multiple myeloma, a type of bone marrow cancer. It works by helping your immune system slow or stop the spread of cancer cells. It is a monoclonal antibody. This medicine may be used for other purposes; ask your health care provider or pharmacist if you have questions. COMMON BRAND NAME(S): DARZALEX What should I tell my care team before I take this medication? They need to know if you have any of these conditions: Hereditary fructose intolerance Infection, such as chickenpox, herpes, hepatitis B virus Lung or breathing disease, such as asthma, COPD An unusual or allergic reaction to daratumumab, sorbitol, other medications, foods, dyes, or preservatives Pregnant or trying to get pregnant Breast-feeding How should I use this medication? This medication is injected into a vein. It is given by your care team in a hospital or clinic setting. Talk to your  care team about the use of this medication in children. Special care may be needed. Overdosage: If you think you have taken too much of this medicine contact a poison control center or emergency room at once. NOTE: This medicine is only for you. Do not share this medicine with others. What if I miss a dose? Keep appointments for follow-up doses. It is important not to miss your dose. Call your care team if you are unable to keep an appointment. What may interact with this medication? Interactions have not been studied. This list may not describe all possible interactions. Give your health care provider a list of all the medicines, herbs, non-prescription drugs, or dietary supplements you use. Also tell them if you smoke, drink alcohol, or use illegal drugs. Some items may interact with your medicine. What should I watch for while using this medication? Your condition will be monitored carefully while you are receiving this medication. This medication can cause serious allergic reactions. To reduce your risk, your care team may give you other medication to take before receiving this one. Be sure to follow the directions from your care team. This medication can affect the results of blood tests to match your blood type. These changes can last for up to 6 months after the final dose. Your care team will do blood tests to match your blood type before you start treatment. Tell all of your care team that you are being treated with this medication before receiving a blood transfusion. This medication can affect the results of some tests used to determine treatment response; extra tests may be needed  to evaluate response. Talk to your care team if you wish to become pregnant or think you are pregnant. This medication can cause serious birth defects if taken during pregnancy and for 3 months after the last dose. A reliable form of contraception is recommended while taking this medication and for 3 months after  the last dose. Talk to your care team about effective forms of contraception. Do not breast-feed while taking this medication. What side effects may I notice from receiving this medication? Side effects that you should report to your care team as soon as possible: Allergic reactions--skin rash, itching, hives, swelling of the face, lips, tongue, or throat Infection--fever, chills, cough, sore throat, wounds that don't heal, pain or trouble when passing urine, general feeling of discomfort or being unwell Infusion reactions--chest pain, shortness of breath or trouble breathing, feeling faint or lightheaded Unusual bruising or bleeding Side effects that usually do not require medical attention (report to your care team if they continue or are bothersome): Constipation Diarrhea Fatigue Nausea Pain, tingling, or numbness in the hands or feet Swelling of the ankles, hands, or feet This list may not describe all possible side effects. Call your doctor for medical advice about side effects. You may report side effects to FDA at 1-800-FDA-1088. Where should I keep my medication? This medication is given in a hospital or clinic. It will not be stored at home. NOTE: This sheet is a summary. It may not cover all possible information. If you have questions about this medicine, talk to your doctor, pharmacist, or health care provider.  2023 Elsevier/Gold Standard (2021-06-03 00:00:00)        To help prevent nausea and vomiting after your treatment, we encourage you to take your nausea medication as directed.  BELOW ARE SYMPTOMS THAT SHOULD BE REPORTED IMMEDIATELY: *FEVER GREATER THAN 100.4 F (38 C) OR HIGHER *CHILLS OR SWEATING *NAUSEA AND VOMITING THAT IS NOT CONTROLLED WITH YOUR NAUSEA MEDICATION *UNUSUAL SHORTNESS OF BREATH *UNUSUAL BRUISING OR BLEEDING *URINARY PROBLEMS (pain or burning when urinating, or frequent urination) *BOWEL PROBLEMS (unusual diarrhea, constipation, pain near the  anus) TENDERNESS IN MOUTH AND THROAT WITH OR WITHOUT PRESENCE OF ULCERS (sore throat, sores in mouth, or a toothache) UNUSUAL RASH, SWELLING OR PAIN  UNUSUAL VAGINAL DISCHARGE OR ITCHING   Items with * indicate a potential emergency and should be followed up as soon as possible or go to the Emergency Department if any problems should occur.  Please show the CHEMOTHERAPY ALERT CARD or IMMUNOTHERAPY ALERT CARD at check-in to the Emergency Department and triage nurse.  Should you have questions after your visit or need to cancel or reschedule your appointment, please contact [facility] [phone]  and follow the prompts.  Office hours are 8:00 a.m. to 4:30 p.m. Monday - Friday. Please note that voicemails left after 4:00 p.m. may not be returned until the following business day.  We are closed weekends and major holidays. You have access to a nurse at all times for urgent questions. Please call the main number to the clinic [phone] and follow the prompts.  For any non-urgent questions, you may also contact your provider using MyChart. We now offer e-Visits for anyone 42 and older to request care online for non-urgent symptoms. For details visit [email or web address].   Also download the MyChart app! Go to the app store, search "MyChart", open the app, select [facility], and log in with your MyChart username and password.  Masks are optional in the cancer  centers. If you would like for your care team to wear a mask while they are taking care of you, please let them know. You may have one support person who is at least 86 years old accompany you for your appointments.

## 2022-01-07 LAB — KAPPA/LAMBDA LIGHT CHAINS
Kappa free light chain: 30.8 mg/L — ABNORMAL HIGH (ref 3.3–19.4)
Kappa, lambda light chain ratio: 2.15 — ABNORMAL HIGH (ref 0.26–1.65)
Lambda free light chains: 14.3 mg/L (ref 5.7–26.3)

## 2022-01-09 ENCOUNTER — Other Ambulatory Visit: Payer: Self-pay

## 2022-01-11 ENCOUNTER — Inpatient Hospital Stay: Payer: Medicare Other

## 2022-01-11 ENCOUNTER — Inpatient Hospital Stay: Payer: Medicare Other | Admitting: Hematology

## 2022-01-11 LAB — PROTEIN ELECTROPHORESIS, SERUM
A/G Ratio: 1.3 (ref 0.7–1.7)
Albumin ELP: 3 g/dL (ref 2.9–4.4)
Alpha-1-Globulin: 0.3 g/dL (ref 0.0–0.4)
Alpha-2-Globulin: 0.5 g/dL (ref 0.4–1.0)
Beta Globulin: 0.8 g/dL (ref 0.7–1.3)
Gamma Globulin: 0.8 g/dL (ref 0.4–1.8)
Globulin, Total: 2.4 g/dL (ref 2.2–3.9)
M-Spike, %: 0.6 g/dL — ABNORMAL HIGH
Total Protein ELP: 5.4 g/dL — ABNORMAL LOW (ref 6.0–8.5)

## 2022-01-13 ENCOUNTER — Inpatient Hospital Stay: Payer: Medicare Other

## 2022-01-13 VITALS — BP 126/59 | HR 67 | Temp 97.7°F | Resp 16

## 2022-01-13 DIAGNOSIS — D509 Iron deficiency anemia, unspecified: Secondary | ICD-10-CM | POA: Diagnosis not present

## 2022-01-13 DIAGNOSIS — Z79899 Other long term (current) drug therapy: Secondary | ICD-10-CM | POA: Diagnosis not present

## 2022-01-13 DIAGNOSIS — C9000 Multiple myeloma not having achieved remission: Secondary | ICD-10-CM

## 2022-01-13 DIAGNOSIS — Z7961 Long term (current) use of immunomodulator: Secondary | ICD-10-CM | POA: Diagnosis not present

## 2022-01-13 DIAGNOSIS — Z5112 Encounter for antineoplastic immunotherapy: Secondary | ICD-10-CM | POA: Diagnosis not present

## 2022-01-13 DIAGNOSIS — I129 Hypertensive chronic kidney disease with stage 1 through stage 4 chronic kidney disease, or unspecified chronic kidney disease: Secondary | ICD-10-CM | POA: Diagnosis not present

## 2022-01-13 DIAGNOSIS — I739 Peripheral vascular disease, unspecified: Secondary | ICD-10-CM | POA: Diagnosis not present

## 2022-01-13 DIAGNOSIS — Z95828 Presence of other vascular implants and grafts: Secondary | ICD-10-CM

## 2022-01-13 DIAGNOSIS — I10 Essential (primary) hypertension: Secondary | ICD-10-CM | POA: Diagnosis not present

## 2022-01-13 DIAGNOSIS — E7800 Pure hypercholesterolemia, unspecified: Secondary | ICD-10-CM | POA: Diagnosis not present

## 2022-01-13 DIAGNOSIS — Z7902 Long term (current) use of antithrombotics/antiplatelets: Secondary | ICD-10-CM | POA: Diagnosis not present

## 2022-01-13 DIAGNOSIS — Z79624 Long term (current) use of inhibitors of nucleotide synthesis: Secondary | ICD-10-CM | POA: Diagnosis not present

## 2022-01-13 DIAGNOSIS — E876 Hypokalemia: Secondary | ICD-10-CM | POA: Diagnosis not present

## 2022-01-13 DIAGNOSIS — N189 Chronic kidney disease, unspecified: Secondary | ICD-10-CM | POA: Diagnosis not present

## 2022-01-13 DIAGNOSIS — Z9221 Personal history of antineoplastic chemotherapy: Secondary | ICD-10-CM | POA: Diagnosis not present

## 2022-01-13 DIAGNOSIS — Z7982 Long term (current) use of aspirin: Secondary | ICD-10-CM | POA: Diagnosis not present

## 2022-01-13 LAB — CBC WITH DIFFERENTIAL/PLATELET
Abs Immature Granulocytes: 0.03 10*3/uL (ref 0.00–0.07)
Basophils Absolute: 0 10*3/uL (ref 0.0–0.1)
Basophils Relative: 1 %
Eosinophils Absolute: 0 10*3/uL (ref 0.0–0.5)
Eosinophils Relative: 1 %
HCT: 35.1 % — ABNORMAL LOW (ref 36.0–46.0)
Hemoglobin: 11.5 g/dL — ABNORMAL LOW (ref 12.0–15.0)
Immature Granulocytes: 1 %
Lymphocytes Relative: 35 %
Lymphs Abs: 2.3 10*3/uL (ref 0.7–4.0)
MCH: 31.3 pg (ref 26.0–34.0)
MCHC: 32.8 g/dL (ref 30.0–36.0)
MCV: 95.6 fL (ref 80.0–100.0)
Monocytes Absolute: 1.2 10*3/uL — ABNORMAL HIGH (ref 0.1–1.0)
Monocytes Relative: 18 %
Neutro Abs: 2.9 10*3/uL (ref 1.7–7.7)
Neutrophils Relative %: 44 %
Platelets: 385 10*3/uL (ref 150–400)
RBC: 3.67 MIL/uL — ABNORMAL LOW (ref 3.87–5.11)
RDW: 20.7 % — ABNORMAL HIGH (ref 11.5–15.5)
WBC: 6.5 10*3/uL (ref 4.0–10.5)
nRBC: 0 % (ref 0.0–0.2)

## 2022-01-13 LAB — COMPREHENSIVE METABOLIC PANEL
ALT: 16 U/L (ref 0–44)
AST: 14 U/L — ABNORMAL LOW (ref 15–41)
Albumin: 3 g/dL — ABNORMAL LOW (ref 3.5–5.0)
Alkaline Phosphatase: 63 U/L (ref 38–126)
Anion gap: 4 — ABNORMAL LOW (ref 5–15)
BUN: 14 mg/dL (ref 8–23)
CO2: 23 mmol/L (ref 22–32)
Calcium: 8.3 mg/dL — ABNORMAL LOW (ref 8.9–10.3)
Chloride: 110 mmol/L (ref 98–111)
Creatinine, Ser: 1.08 mg/dL — ABNORMAL HIGH (ref 0.44–1.00)
GFR, Estimated: 49 mL/min — ABNORMAL LOW (ref >60)
Glucose, Bld: 117 mg/dL — ABNORMAL HIGH (ref 70–99)
Potassium: 3.8 mmol/L (ref 3.5–5.1)
Sodium: 137 mmol/L (ref 135–145)
Total Bilirubin: 0.6 mg/dL (ref 0.3–1.2)
Total Protein: 5.4 g/dL — ABNORMAL LOW (ref 6.5–8.1)

## 2022-01-13 LAB — MAGNESIUM: Magnesium: 1.8 mg/dL (ref 1.7–2.4)

## 2022-01-13 MED ORDER — SODIUM CHLORIDE 0.9% FLUSH
10.0000 mL | Freq: Once | INTRAVENOUS | Status: AC
  Administered 2022-01-13: 10 mL via INTRAVENOUS

## 2022-01-13 MED ORDER — DIPHENHYDRAMINE HCL 25 MG PO CAPS
25.0000 mg | ORAL_CAPSULE | Freq: Once | ORAL | Status: AC
  Administered 2022-01-13: 25 mg via ORAL
  Filled 2022-01-13: qty 1

## 2022-01-13 MED ORDER — ACETAMINOPHEN 325 MG PO TABS
650.0000 mg | ORAL_TABLET | Freq: Once | ORAL | Status: AC
  Administered 2022-01-13: 650 mg via ORAL
  Filled 2022-01-13: qty 2

## 2022-01-13 MED ORDER — DARATUMUMAB-HYALURONIDASE-FIHJ 1800-30000 MG-UT/15ML ~~LOC~~ SOLN
1800.0000 mg | Freq: Once | SUBCUTANEOUS | Status: AC
  Administered 2022-01-13: 1800 mg via SUBCUTANEOUS
  Filled 2022-01-13: qty 15

## 2022-01-13 MED ORDER — HEPARIN SOD (PORK) LOCK FLUSH 100 UNIT/ML IV SOLN
500.0000 [IU] | Freq: Once | INTRAVENOUS | Status: AC
  Administered 2022-01-13: 500 [IU] via INTRAVENOUS

## 2022-01-13 MED ORDER — DEXAMETHASONE 4 MG PO TABS
20.0000 mg | ORAL_TABLET | Freq: Once | ORAL | Status: AC
  Administered 2022-01-13: 20 mg via ORAL
  Filled 2022-01-13: qty 5

## 2022-01-13 NOTE — Progress Notes (Signed)
Patient presents today for Daratumumab injection.  Patient is in satisfactory condition with no complaints voiced.  Vital signs are stable.  Labs reviewed.

## 2022-01-13 NOTE — Progress Notes (Signed)
Patient tolerated Daratumumab injection with no complaints voiced. See MAR for details. Lab reviewed. Injection site clean and dry with no bruising or swelling noted at site. Band aid applied. Vss with discharge and left in satisfactory condition with nos/s of distress noted.

## 2022-01-13 NOTE — Patient Instructions (Signed)
[facility]  Discharge Instructions: Thank you for choosing [facility] to provide your oncology and hematology care.  If you have a lab appointment with the Cancer Center, please come in thru the Main Entrance and check in at the main information desk.  Wear comfortable clothing and clothing appropriate for easy access to any Portacath or PICC line.   We strive to give you quality time with your provider. You may need to reschedule your appointment if you arrive late (15 or more minutes).  Arriving late affects you and other patients whose appointments are after yours.  Also, if you miss three or more appointments without notifying the office, you may be dismissed from the clinic at the provider's discretion.      For prescription refill requests, have your pharmacy contact our office and allow 72 hours for refills to be completed.    Today you received the following chemotherapy and/or immunotherapy agents Daratumumab, return as scheduled.   To help prevent nausea and vomiting after your treatment, we encourage you to take your nausea medication as directed.  BELOW ARE SYMPTOMS THAT SHOULD BE REPORTED IMMEDIATELY: *FEVER GREATER THAN 100.4 F (38 C) OR HIGHER *CHILLS OR SWEATING *NAUSEA AND VOMITING THAT IS NOT CONTROLLED WITH YOUR NAUSEA MEDICATION *UNUSUAL SHORTNESS OF BREATH *UNUSUAL BRUISING OR BLEEDING *URINARY PROBLEMS (pain or burning when urinating, or frequent urination) *BOWEL PROBLEMS (unusual diarrhea, constipation, pain near the anus) TENDERNESS IN MOUTH AND THROAT WITH OR WITHOUT PRESENCE OF ULCERS (sore throat, sores in mouth, or a toothache) UNUSUAL RASH, SWELLING OR PAIN  UNUSUAL VAGINAL DISCHARGE OR ITCHING   Items with * indicate a potential emergency and should be followed up as soon as possible or go to the Emergency Department if any problems should occur.  Please show the CHEMOTHERAPY ALERT CARD or IMMUNOTHERAPY ALERT CARD at  check-in to the Emergency Department and triage nurse.  Should you have questions after your visit or need to cancel or reschedule your appointment, please contact [facility] AT  [phone]  and follow the prompts.  Office hours are 8:00 a.m. to 4:30 p.m. Monday - Friday. Please note that voicemails left after 4:00 p.m. may not be returned until the following business day.  We are closed weekends and major holidays. You have access to a nurse at all times for urgent questions. Please call the main number to the clinic [phone] and follow the prompts.  For any non-urgent questions, you may also contact your provider using MyChart. We now offer e-Visits for anyone 18 and older to request care online for non-urgent symptoms. For details visit [email or web address].   Also download the MyChart app! Go to the app store, search "MyChart", open the app, select [facility], and log in with your MyChart username and password.  Masks are optional in the cancer centers. If you would like for your care team to wear a mask while they are taking care of you, please let them know. You may have one support person who is at least 86 years old accompany you for your appointments.  

## 2022-01-15 ENCOUNTER — Other Ambulatory Visit: Payer: Self-pay

## 2022-01-18 ENCOUNTER — Other Ambulatory Visit: Payer: Medicare Other

## 2022-01-18 ENCOUNTER — Ambulatory Visit: Payer: Medicare Other

## 2022-01-19 ENCOUNTER — Other Ambulatory Visit: Payer: Self-pay

## 2022-01-19 LAB — IMMUNOFIXATION ELECTROPHORESIS
IgA: 84 mg/dL (ref 64–422)
IgG (Immunoglobin G), Serum: 725 mg/dL (ref 586–1602)
IgM (Immunoglobulin M), Srm: 54 mg/dL (ref 26–217)
Total Protein ELP: 5.2 g/dL — ABNORMAL LOW (ref 6.0–8.5)

## 2022-01-20 ENCOUNTER — Inpatient Hospital Stay: Payer: Medicare Other

## 2022-01-20 ENCOUNTER — Ambulatory Visit: Payer: Medicare Other | Admitting: Hematology

## 2022-01-20 ENCOUNTER — Other Ambulatory Visit: Payer: Self-pay

## 2022-01-20 VITALS — BP 145/55 | HR 71 | Temp 98.0°F | Resp 20 | Wt 146.6 lb

## 2022-01-20 DIAGNOSIS — Z7982 Long term (current) use of aspirin: Secondary | ICD-10-CM | POA: Diagnosis not present

## 2022-01-20 DIAGNOSIS — C9000 Multiple myeloma not having achieved remission: Secondary | ICD-10-CM | POA: Diagnosis not present

## 2022-01-20 DIAGNOSIS — Z7961 Long term (current) use of immunomodulator: Secondary | ICD-10-CM | POA: Diagnosis not present

## 2022-01-20 DIAGNOSIS — Z95828 Presence of other vascular implants and grafts: Secondary | ICD-10-CM

## 2022-01-20 DIAGNOSIS — I739 Peripheral vascular disease, unspecified: Secondary | ICD-10-CM | POA: Diagnosis not present

## 2022-01-20 DIAGNOSIS — N189 Chronic kidney disease, unspecified: Secondary | ICD-10-CM | POA: Diagnosis not present

## 2022-01-20 DIAGNOSIS — Z7902 Long term (current) use of antithrombotics/antiplatelets: Secondary | ICD-10-CM | POA: Diagnosis not present

## 2022-01-20 DIAGNOSIS — E7800 Pure hypercholesterolemia, unspecified: Secondary | ICD-10-CM | POA: Diagnosis not present

## 2022-01-20 DIAGNOSIS — Z79624 Long term (current) use of inhibitors of nucleotide synthesis: Secondary | ICD-10-CM | POA: Diagnosis not present

## 2022-01-20 DIAGNOSIS — D702 Other drug-induced agranulocytosis: Secondary | ICD-10-CM | POA: Insufficient documentation

## 2022-01-20 DIAGNOSIS — Z9221 Personal history of antineoplastic chemotherapy: Secondary | ICD-10-CM | POA: Diagnosis not present

## 2022-01-20 DIAGNOSIS — E876 Hypokalemia: Secondary | ICD-10-CM | POA: Diagnosis not present

## 2022-01-20 DIAGNOSIS — D509 Iron deficiency anemia, unspecified: Secondary | ICD-10-CM | POA: Diagnosis not present

## 2022-01-20 DIAGNOSIS — I129 Hypertensive chronic kidney disease with stage 1 through stage 4 chronic kidney disease, or unspecified chronic kidney disease: Secondary | ICD-10-CM | POA: Diagnosis not present

## 2022-01-20 DIAGNOSIS — Z5112 Encounter for antineoplastic immunotherapy: Secondary | ICD-10-CM | POA: Diagnosis not present

## 2022-01-20 DIAGNOSIS — Z79899 Other long term (current) drug therapy: Secondary | ICD-10-CM | POA: Diagnosis not present

## 2022-01-20 DIAGNOSIS — I10 Essential (primary) hypertension: Secondary | ICD-10-CM | POA: Diagnosis not present

## 2022-01-20 LAB — CBC WITH DIFFERENTIAL/PLATELET
Abs Immature Granulocytes: 0 10*3/uL (ref 0.00–0.07)
Basophils Absolute: 0 10*3/uL (ref 0.0–0.1)
Basophils Relative: 1 %
Eosinophils Absolute: 0.1 10*3/uL (ref 0.0–0.5)
Eosinophils Relative: 3 %
HCT: 35.8 % — ABNORMAL LOW (ref 36.0–46.0)
Hemoglobin: 11.6 g/dL — ABNORMAL LOW (ref 12.0–15.0)
Lymphocytes Relative: 71 %
Lymphs Abs: 2.6 10*3/uL (ref 0.7–4.0)
MCH: 30.7 pg (ref 26.0–34.0)
MCHC: 32.4 g/dL (ref 30.0–36.0)
MCV: 94.7 fL (ref 80.0–100.0)
Monocytes Absolute: 0.5 10*3/uL (ref 0.1–1.0)
Monocytes Relative: 15 %
Neutro Abs: 0.4 10*3/uL — CL (ref 1.7–7.7)
Neutrophils Relative %: 10 %
Platelets: 286 10*3/uL (ref 150–400)
RBC: 3.78 MIL/uL — ABNORMAL LOW (ref 3.87–5.11)
RDW: 19.9 % — ABNORMAL HIGH (ref 11.5–15.5)
WBC: 3.6 10*3/uL — ABNORMAL LOW (ref 4.0–10.5)
nRBC: 0 % (ref 0.0–0.2)

## 2022-01-20 MED ORDER — SODIUM CHLORIDE 0.9% FLUSH
10.0000 mL | Freq: Once | INTRAVENOUS | Status: AC
  Administered 2022-01-20: 10 mL via INTRAVENOUS

## 2022-01-20 MED ORDER — DIPHENHYDRAMINE HCL 25 MG PO CAPS
25.0000 mg | ORAL_CAPSULE | Freq: Once | ORAL | Status: AC
  Administered 2022-01-20: 25 mg via ORAL
  Filled 2022-01-20: qty 1

## 2022-01-20 MED ORDER — HEPARIN SOD (PORK) LOCK FLUSH 100 UNIT/ML IV SOLN
500.0000 [IU] | Freq: Once | INTRAVENOUS | Status: AC
  Administered 2022-01-20: 500 [IU] via INTRAVENOUS

## 2022-01-20 MED ORDER — DEXAMETHASONE 4 MG PO TABS
20.0000 mg | ORAL_TABLET | Freq: Once | ORAL | Status: AC
  Administered 2022-01-20: 20 mg via ORAL
  Filled 2022-01-20: qty 5

## 2022-01-20 MED ORDER — LENALIDOMIDE 15 MG PO CAPS: 15.0000 mg | ORAL_CAPSULE | Freq: Every day | ORAL | 0 refills | Status: DC

## 2022-01-20 MED ORDER — FILGRASTIM-AAFI 300 MCG/0.5ML IJ SOSY
300.0000 ug | PREFILLED_SYRINGE | Freq: Once | INTRAMUSCULAR | Status: AC
  Administered 2022-01-20: 300 ug via SUBCUTANEOUS
  Filled 2022-01-20: qty 0.5

## 2022-01-20 MED ORDER — ACETAMINOPHEN 325 MG PO TABS
650.0000 mg | ORAL_TABLET | Freq: Once | ORAL | Status: AC
  Administered 2022-01-20: 650 mg via ORAL
  Filled 2022-01-20: qty 2

## 2022-01-20 MED ORDER — DARATUMUMAB-HYALURONIDASE-FIHJ 1800-30000 MG-UT/15ML ~~LOC~~ SOLN
1800.0000 mg | Freq: Once | SUBCUTANEOUS | Status: AC
  Administered 2022-01-20: 1800 mg via SUBCUTANEOUS
  Filled 2022-01-20: qty 15

## 2022-01-20 NOTE — Progress Notes (Signed)
CRITICAL VALUE ALERT Critical value received:  Leslie Williamson  Date of notification:  01-20-2022 Time of notification: 10:18 am Critical value read back:  Yes.   Nurse who received alert:  B. Camora Tremain RN.  MD notified time and response:  Dr. Delton Coombes / A. Anderson RN @ 10:20 am. Orders received to give GCSF 300 mcg x 1 dose.

## 2022-01-20 NOTE — Progress Notes (Signed)
Patient presents today for Dara. ANC is 0.4, Dr. Delton Coombes made aware, patient okay for treatment with additional orders received for GCSF '300mg'$  injection.  Patient tolerated Daratumumab injection with no complaints voiced. See MAR for details. Lab reviewed. Injection site clean and dry with no bruising or swelling noted at site. Band aid applied.  Patient tolerated injection with no complaints voiced. Site clean and dry with no bruising or swelling noted at site. See MAR for details. Band aid applied.  Patient stable during and after injection. VSS with discharge and left in satisfactory condition with no s/s of distress noted.

## 2022-01-20 NOTE — Patient Instructions (Signed)
[facility]  Discharge Instructions: Thank you for choosing [facility] to provide your oncology and hematology care.  If you have a lab appointment with the [facility], please come in thru the Main Entrance and check in at the main information desk.  Wear comfortable clothing and clothing appropriate for easy access to any Portacath or PICC line.   We strive to give you quality time with your provider. You may need to reschedule your appointment if you arrive late (15 or more minutes).  Arriving late affects you and other patients whose appointments are after yours.  Also, if you miss three or more appointments without notifying the office, you may be dismissed from the clinic at the provider's discretion.      For prescription refill requests, have your pharmacy contact our office and allow 72 hours for refills to be completed.    Today you received the following chemotherapy and/or immunotherapy agents Daratumumab and GCSF '300mg'$ . Return as scheduled.   To help prevent nausea and vomiting after your treatment, we encourage you to take your nausea medication as directed.  BELOW ARE SYMPTOMS THAT SHOULD BE REPORTED IMMEDIATELY: *FEVER GREATER THAN 100.4 F (38 C) OR HIGHER *CHILLS OR SWEATING *NAUSEA AND VOMITING THAT IS NOT CONTROLLED WITH YOUR NAUSEA MEDICATION *UNUSUAL SHORTNESS OF BREATH *UNUSUAL BRUISING OR BLEEDING *URINARY PROBLEMS (pain or burning when urinating, or frequent urination) *BOWEL PROBLEMS (unusual diarrhea, constipation, pain near the anus) TENDERNESS IN MOUTH AND THROAT WITH OR WITHOUT PRESENCE OF ULCERS (sore throat, sores in mouth, or a toothache) UNUSUAL RASH, SWELLING OR PAIN  UNUSUAL VAGINAL DISCHARGE OR ITCHING   Items with * indicate a potential emergency and should be followed up as soon as possible or go to the Emergency Department if any problems should occur.  Please show the CHEMOTHERAPY ALERT CARD or IMMUNOTHERAPY  ALERT CARD at check-in to the Emergency Department and triage nurse.  Should you have questions after your visit or need to cancel or reschedule your appointment, please contact [facility] [phone]  and follow the prompts.  Office hours are 8:00 a.m. to 4:30 p.m. Monday - Friday. Please note that voicemails left after 4:00 p.m. may not be returned until the following business day.  We are closed weekends and major holidays. You have access to a nurse at all times for urgent questions. Please call the main number to the clinic [phone] and follow the prompts.  For any non-urgent questions, you may also contact your provider using MyChart. We now offer e-Visits for anyone 31 and older to request care online for non-urgent symptoms. For details visit [email or web address].   Also download the MyChart app! Go to the app store, search "MyChart", open the app, select [facility], and log in with your MyChart username and password.  Masks are optional in the cancer centers. If you would like for your care team to wear a mask while they are taking care of you, please let them know. You may have one support person who is at least 86 years old accompany you for your appointments.

## 2022-01-20 NOTE — Telephone Encounter (Signed)
Chart reviewed. Revlimid refilled per last office note with Dr. Katragadda.  

## 2022-01-21 DIAGNOSIS — C884 Extranodal marginal zone B-cell lymphoma of mucosa-associated lymphoid tissue [MALT-lymphoma]: Secondary | ICD-10-CM | POA: Diagnosis not present

## 2022-01-21 DIAGNOSIS — I10 Essential (primary) hypertension: Secondary | ICD-10-CM | POA: Diagnosis not present

## 2022-01-22 ENCOUNTER — Other Ambulatory Visit: Payer: Self-pay

## 2022-01-25 ENCOUNTER — Other Ambulatory Visit: Payer: Self-pay

## 2022-01-27 ENCOUNTER — Inpatient Hospital Stay: Payer: Medicare Other

## 2022-01-27 ENCOUNTER — Inpatient Hospital Stay: Payer: Medicare Other | Attending: Hematology

## 2022-01-27 ENCOUNTER — Inpatient Hospital Stay (HOSPITAL_BASED_OUTPATIENT_CLINIC_OR_DEPARTMENT_OTHER): Payer: Medicare Other | Admitting: Hematology

## 2022-01-27 VITALS — BP 138/47 | HR 68 | Temp 97.8°F | Resp 20 | Ht 60.0 in | Wt 148.6 lb

## 2022-01-27 DIAGNOSIS — Z9221 Personal history of antineoplastic chemotherapy: Secondary | ICD-10-CM | POA: Diagnosis not present

## 2022-01-27 DIAGNOSIS — Z79624 Long term (current) use of inhibitors of nucleotide synthesis: Secondary | ICD-10-CM | POA: Insufficient documentation

## 2022-01-27 DIAGNOSIS — E876 Hypokalemia: Secondary | ICD-10-CM | POA: Insufficient documentation

## 2022-01-27 DIAGNOSIS — Z79899 Other long term (current) drug therapy: Secondary | ICD-10-CM | POA: Insufficient documentation

## 2022-01-27 DIAGNOSIS — Z7961 Long term (current) use of immunomodulator: Secondary | ICD-10-CM | POA: Diagnosis not present

## 2022-01-27 DIAGNOSIS — C9000 Multiple myeloma not having achieved remission: Secondary | ICD-10-CM | POA: Diagnosis not present

## 2022-01-27 DIAGNOSIS — Z7902 Long term (current) use of antithrombotics/antiplatelets: Secondary | ICD-10-CM | POA: Diagnosis not present

## 2022-01-27 DIAGNOSIS — N189 Chronic kidney disease, unspecified: Secondary | ICD-10-CM | POA: Diagnosis not present

## 2022-01-27 DIAGNOSIS — D631 Anemia in chronic kidney disease: Secondary | ICD-10-CM | POA: Insufficient documentation

## 2022-01-27 DIAGNOSIS — D509 Iron deficiency anemia, unspecified: Secondary | ICD-10-CM | POA: Diagnosis not present

## 2022-01-27 DIAGNOSIS — E7800 Pure hypercholesterolemia, unspecified: Secondary | ICD-10-CM | POA: Insufficient documentation

## 2022-01-27 DIAGNOSIS — I739 Peripheral vascular disease, unspecified: Secondary | ICD-10-CM | POA: Diagnosis not present

## 2022-01-27 DIAGNOSIS — I129 Hypertensive chronic kidney disease with stage 1 through stage 4 chronic kidney disease, or unspecified chronic kidney disease: Secondary | ICD-10-CM | POA: Diagnosis not present

## 2022-01-27 DIAGNOSIS — Z7982 Long term (current) use of aspirin: Secondary | ICD-10-CM | POA: Diagnosis not present

## 2022-01-27 DIAGNOSIS — Z5112 Encounter for antineoplastic immunotherapy: Secondary | ICD-10-CM | POA: Insufficient documentation

## 2022-01-27 DIAGNOSIS — Z95828 Presence of other vascular implants and grafts: Secondary | ICD-10-CM

## 2022-01-27 LAB — CBC WITH DIFFERENTIAL/PLATELET
Abs Immature Granulocytes: 0.02 10*3/uL (ref 0.00–0.07)
Basophils Absolute: 0 10*3/uL (ref 0.0–0.1)
Basophils Relative: 1 %
Eosinophils Absolute: 0.1 10*3/uL (ref 0.0–0.5)
Eosinophils Relative: 1 %
HCT: 37 % (ref 36.0–46.0)
Hemoglobin: 11.9 g/dL — ABNORMAL LOW (ref 12.0–15.0)
Immature Granulocytes: 0 %
Lymphocytes Relative: 40 %
Lymphs Abs: 2.1 10*3/uL (ref 0.7–4.0)
MCH: 30.6 pg (ref 26.0–34.0)
MCHC: 32.2 g/dL (ref 30.0–36.0)
MCV: 95.1 fL (ref 80.0–100.0)
Monocytes Absolute: 0.8 10*3/uL (ref 0.1–1.0)
Monocytes Relative: 15 %
Neutro Abs: 2.3 10*3/uL (ref 1.7–7.7)
Neutrophils Relative %: 43 %
Platelets: 350 10*3/uL (ref 150–400)
RBC: 3.89 MIL/uL (ref 3.87–5.11)
RDW: 20 % — ABNORMAL HIGH (ref 11.5–15.5)
WBC: 5.3 10*3/uL (ref 4.0–10.5)
nRBC: 0.4 % — ABNORMAL HIGH (ref 0.0–0.2)

## 2022-01-27 LAB — COMPREHENSIVE METABOLIC PANEL
ALT: 13 U/L (ref 0–44)
AST: 14 U/L — ABNORMAL LOW (ref 15–41)
Albumin: 2.8 g/dL — ABNORMAL LOW (ref 3.5–5.0)
Alkaline Phosphatase: 62 U/L (ref 38–126)
Anion gap: 6 (ref 5–15)
BUN: 11 mg/dL (ref 8–23)
CO2: 21 mmol/L — ABNORMAL LOW (ref 22–32)
Calcium: 8 mg/dL — ABNORMAL LOW (ref 8.9–10.3)
Chloride: 110 mmol/L (ref 98–111)
Creatinine, Ser: 1.05 mg/dL — ABNORMAL HIGH (ref 0.44–1.00)
GFR, Estimated: 51 mL/min — ABNORMAL LOW (ref >60)
Glucose, Bld: 127 mg/dL — ABNORMAL HIGH (ref 70–99)
Potassium: 3.6 mmol/L (ref 3.5–5.1)
Sodium: 137 mmol/L (ref 135–145)
Total Bilirubin: 0.6 mg/dL (ref 0.3–1.2)
Total Protein: 5.3 g/dL — ABNORMAL LOW (ref 6.5–8.1)

## 2022-01-27 LAB — MAGNESIUM: Magnesium: 1.7 mg/dL (ref 1.7–2.4)

## 2022-01-27 MED ORDER — DARATUMUMAB-HYALURONIDASE-FIHJ 1800-30000 MG-UT/15ML ~~LOC~~ SOLN
1800.0000 mg | Freq: Once | SUBCUTANEOUS | Status: AC
  Administered 2022-01-27: 1800 mg via SUBCUTANEOUS
  Filled 2022-01-27: qty 15

## 2022-01-27 MED ORDER — HEPARIN SOD (PORK) LOCK FLUSH 100 UNIT/ML IV SOLN
500.0000 [IU] | Freq: Once | INTRAVENOUS | Status: AC
  Administered 2022-01-27: 500 [IU] via INTRAVENOUS

## 2022-01-27 MED ORDER — SODIUM CHLORIDE 0.9% FLUSH
10.0000 mL | INTRAVENOUS | Status: DC | PRN
  Administered 2022-01-27: 10 mL via INTRAVENOUS

## 2022-01-27 MED ORDER — ACETAMINOPHEN 325 MG PO TABS
650.0000 mg | ORAL_TABLET | Freq: Once | ORAL | Status: AC
  Administered 2022-01-27: 650 mg via ORAL
  Filled 2022-01-27: qty 2

## 2022-01-27 MED ORDER — DIPHENHYDRAMINE HCL 25 MG PO CAPS
25.0000 mg | ORAL_CAPSULE | Freq: Once | ORAL | Status: AC
  Administered 2022-01-27: 25 mg via ORAL
  Filled 2022-01-27: qty 1

## 2022-01-27 MED ORDER — DEXAMETHASONE 4 MG PO TABS: ORAL_TABLET | ORAL | 6 refills | Status: DC

## 2022-01-27 MED ORDER — SODIUM CHLORIDE 0.9% FLUSH
10.0000 mL | Freq: Once | INTRAVENOUS | Status: AC
  Administered 2022-01-27: 10 mL via INTRAVENOUS

## 2022-01-27 MED ORDER — DEXAMETHASONE 4 MG PO TABS
20.0000 mg | ORAL_TABLET | Freq: Once | ORAL | Status: AC
  Administered 2022-01-27: 20 mg via ORAL
  Filled 2022-01-27: qty 5

## 2022-01-27 NOTE — Progress Notes (Signed)
Leslie Williamson, [place] [id number]   CLINIC:  Medical Oncology/Hematology  PCP:  Leslie Meiers, MD [place] [phone]   REASON FOR VISIT:  Follow-up for newly diagnosed multiple myeloma  PRIOR THERAPY: None  NGS Results: Not applicable  CURRENT THERAPY: Daratumumab, Revlimid and dexamethasone  BRIEF ONCOLOGIC HISTORY:  Oncology History  NHL (non-Hodgkin's lymphoma) ([place])  07/19/1995 Pathology Results   Spleen biopsy with resection- low-grade B-cell lymphoma, splenic marginal zone type   02/20/1998 Imaging   CT CAP-marked and extensive cervical adenopathy   02/25/1998 Pathology Results   Right cervical lymph node demonstrating large B-cell lymphoma   03/05/1998 Bone Marrow Biopsy   No evidence of bone marrow involvement   03/11/1998 - 07/18/1998 Chemotherapy   CHOP x 7 cycles   05/09/1998 Imaging   CT CAP and neck- slight interval decrease in size of para-aortic adenopathy with interval decrease in right inguinal and bi-iliac adenopathy.  Interval decrease in size and number of enlarged cervical nodes   07/10/1998 Imaging   Ct CAP and neck- unremarkable CT of chest. No enlarged retroperitoneal adenopathy withthe previously noted retroperitoneal nodes currently smaller to stable in size.  Cervical adenopathy is stable to slightly smaller in size.   08/18/1998 - 09/08/1998 Chemotherapy   Rituxan Day 1, 8, 15, 22 x 1 cycle   01/08/1999 Remission   CT CAP and neck demonstrates no adenopathy or disease   01/08/1999 Imaging   CT CAP and neck- Stable CT of chest. Stable CT abd/pelvis without adenopathy.  Negative CT of neck   Multiple myeloma ([place])  11/16/2021 Initial Diagnosis   Multiple myeloma ([place])   11/30/2021 -  Chemotherapy   Patient is on Treatment Plan : MYELOMA  Daratumumab SQ + Lenalidomide + Dexamethasone (DaraRd) q28d       CANCER STAGING:  Cancer Staging  Multiple myeloma  ([place]) Staging form: Plasma Cell Myeloma and Plasma Cell Disorders, AJCC 8th Edition - Clinical stage from 11/16/2021: Albumin (g/dL): 3.1, ISS: Stage II, High-risk cytogenetics: Absent, LDH: Normal - Unsigned   INTERVAL HISTORY:  Leslie Williamson 88 y.o. female seen for follow-up and toxicity assessment prior to cycle 3 of her myeloma treatments.  Overall she is tolerating reasonably well.  Reports energy levels of 60%.  No pains reported.  REVIEW OF SYSTEMS:  Review of Systems  Gastrointestinal:  Positive for constipation.  All other systems reviewed and are negative.    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   Vertigo, labyrinthine    Past Surgical History:  Procedure Laterality Date   CATARACT EXTRACTION W/PHACO Left 11/03/2015   Procedure: CATARACT EXTRACTION PHACO AND INTRAOCULAR LENS PLACEMENT ([place]);  Surgeon: Tonny Branch, MD;  Location: [facility];  Service: Ophthalmology;  Laterality: Left;  CDE: 7.74   CATARACT EXTRACTION W/PHACO Right 11/20/2015   Procedure: CATARACT EXTRACTION  PHACO AND INTRAOCULAR LENS PLACEMENT ; CDE:  8.30;  Surgeon: Tonny Branch, MD;  Location: [facility];  Service: Ophthalmology;  Laterality: Right;   IR IMAGING GUIDED PORT INSERTION  11/04/2021   PORT-A-CATH REMOVAL       SOCIAL HISTORY:  Social History   Socioeconomic History   Marital status: Widowed    Spouse name: Not on file   Number of children: Not on file   Years of education:  Not on file   Highest education level: Not on file  Occupational History   Not on file  Tobacco Use   Smoking status: Former   Smokeless tobacco: Never  Vaping Use   Vaping Use: Never used  Substance and Sexual Activity   Alcohol use: No   Drug use: No   Sexual activity: Not Currently  Other Topics Concern   Not on file  Social History Narrative   Not on file   Social Determinants of Health   Financial Resource Strain: Low Risk  (12/31/2019)   Overall Financial Resource Strain (CARDIA)    Difficulty of Paying Living Expenses: Not hard at all  Food Insecurity: No Food Insecurity (12/31/2019)   Hunger Vital Sign    Worried About Running Out of Food in the Last Year: Never true    [place] in the Last Year: Never true  Transportation Needs: No Transportation Needs (12/31/2019)   PRAPARE - Hydrologist (Medical): No    Lack of Transportation (Non-Medical): No  Physical Activity: Inactive (12/31/2019)   Exercise Vital Sign    Days of Exercise per Week: 0 days    Minutes of Exercise per Session: 0 min  Stress: No Stress Concern Present (12/31/2019)   [place]    Feeling of Stress : Not at all  Social Connections: Moderately Isolated (12/31/2019)   Social Connection and Isolation Panel [NHANES]    Frequency of Communication with Friends and Family: More than three times a week    Frequency of Social Gatherings with Friends and Family: More than three times a week    Attends Religious Services: More than 4 times per year    Active Member of [employer] or Organizations: No    Attends Archivist Meetings: Never    Marital Status: Widowed  Intimate Partner Violence: Not At Risk (12/31/2019)   Humiliation, Afraid, Rape, and Kick questionnaire    Fear of Current or Ex-Partner: No    Emotionally Abused: No    Physically Abused: No    Sexually Abused: No    FAMILY HISTORY:   Family History  Problem Relation Age of Onset   Diabetes Mother     CURRENT MEDICATIONS:  Outpatient Encounter Medications as of 01/27/2022  Medication Sig   acyclovir (ZOVIRAX) 400 MG tablet Take 1 tablet (400 mg total) by mouth 2 (two) times daily.   amLODipine (NORVASC) 5 MG tablet Take 1 tablet (5 mg total) by mouth daily.   aspirin 81 MG tablet Take 81 mg by mouth daily.   atorvastatin (LIPITOR) 40 MG tablet Take 40 mg by mouth daily.   busPIRone (BUSPAR) 5 MG tablet Take 5 mg by mouth at bedtime as needed (anxiety/sleep).   cilostazol (PLETAL) 100 MG tablet Take 100 mg by mouth 2 (two) times daily.   dexamethasone (DECADRON) 4 MG tablet Take 20 mg (5 tablets) by mouth weekly every Wednesday morning   ferrous sulfate 325 (65 FE) MG  EC tablet Take 1 tablet (325 mg total) by mouth daily with breakfast.   lenalidomide (REVLIMID) 15 MG capsule Take 1 capsule (15 mg total) by mouth daily. Take for 21 days on, 7 days off.   lidocaine-prilocaine (EMLA) cream Apply 1 Application topically as needed.   potassium chloride SA (KLOR-CON M) 20 MEQ tablet Take 2 tablets (40 mEq total) by mouth daily.   prochlorperazine (COMPAZINE) 10 MG tablet Take by mouth.   prochlorperazine (COMPAZINE) 5 MG tablet Take 1 tablet (5 mg total) by mouth every 6 (six) hours as needed for nausea or vomiting.   traZODone (DESYREL) 50 MG tablet Take 50 mg by mouth at bedtime as needed.   [DISCONTINUED] sodium chloride flush (NS) 0.9 % injection 10 mL    No facility-administered encounter medications on file as of 01/27/2022.    ALLERGIES:  Allergies  Allergen Reactions   Penicillins Rash     PHYSICAL EXAM:  ECOG Performance status: 1  There were no vitals filed for this visit.  There were no vitals filed for this visit.  Physical Exam Vitals reviewed.  Constitutional:      Appearance: Normal appearance.  Cardiovascular:     Rate and Rhythm: Normal rate and regular rhythm.     Heart sounds: Normal  heart sounds.  Pulmonary:     Breath sounds: Normal breath sounds.  Abdominal:     Palpations: Abdomen is soft. There is no mass.  Neurological:     General: No focal deficit present.     Mental Status: She is alert and oriented to person, place, and time.  Psychiatric:        Mood and Affect: Mood normal.        Behavior: Behavior normal.      LABORATORY DATA:  I have reviewed the labs as listed.  CBC    Component Value Date/Time   WBC 5.3 01/27/2022 0937   RBC 3.89 01/27/2022 0937   HGB 11.9 (L) 01/27/2022 0937   HCT 37.0 01/27/2022 0937   PLT 350 01/27/2022 0937   MCV 95.1 01/27/2022 0937   MCH 30.6 01/27/2022 0937   MCHC 32.2 01/27/2022 0937   RDW 20.0 (H) 01/27/2022 0937   LYMPHSABS 2.1 01/27/2022 0937   MONOABS 0.8 01/27/2022 0937   EOSABS 0.1 01/27/2022 0937   BASOSABS 0.0 01/27/2022 0937      Latest Ref Rng & Units 01/27/2022    9:37 AM 01/13/2022    8:02 AM 01/06/2022    9:11 AM  CMP  Glucose 70 - 99 mg/dL 127  117  97   BUN 8 - 23 mg/dL _0 Creatinine 0.44 - 1.00 mg/dL 1.05  1.08  1.00   Sodium 135 - 145 mmol/L 137  137  138   Potassium 3.5 - 5.1 mmol/L 3.6  3.8  3.6   Chloride 98 - 111 mmol/L 110  110  109   CO2 22 - 32 mmol/L _1 Calcium 8.9 - 10.3 mg/dL 8.0  8.3  8.5   Total Protein 6.5 - 8.1 g/dL 5.3  5.4  5.8   Total Bilirubin 0.3 - 1.2 mg/dL 0.6  0.6  0.4   Alkaline Phos 38 - 126 U/L 62  63  71   AST 15 - 41 U/L _2 ALT 0 - 44 U/L _3 DIAGNOSTIC IMAGING:  I have independently  reviewed the scans and discussed with the patient.  ASSESSMENT:  1.  Biclonal IgG lambda and IgM kappa multiple myeloma, high risk: - BMBX (11/02/2021): IgG lambda plasma cells 70% - Myeloma FISH panel: Monosomy 13 (standard GIST), duplication of 1 q. (high risk), gain of 11 q. (standard risk). - Cytogenetics: Hyperdiploid E weight gain (trisomy/trisomy) of odd-numbered chromosomes and monosomy 13, standard risk.  Gain of 1 q.  associated with progressive course. - PET scan (11/05/2021): No evidence of multiple myeloma.  No hypermetabolic adenopathy. - Daratumumab, Revlimid and dexamethasone cycle 1 started on 11/30/2021  2.  JAK2 positive essential thrombocytosis: - BMBX (11/02/2021): No evidence of fibrosis.  No morphological evidence of JAK2 positive ET.  3.  History of marginal zone lymphoma of the spleen: - Diagnosed in 1999, status post CHOP x7 cycles followed by rituximab 1 cycle. - Status post splenectomy.  No evidence of lymphoma on current PET scan.  4.  Social/family history: - She lives with her sister.  Son lives 30 minutes away.  She is independent of ADLs and some IADLs.  She drives.  She worked at [employer] and denies any exposure to chemicals or pesticides.  No family history of malignancies.    PLAN:  1.  IgG lambda multiple myeloma: - She is tolerating Revlimid, Darzalex and dexamethasone very well. - Reviewed myeloma labs from 01/06/2022.  M spike improved to 0.6 from 2.5 g.  Light chain ratio is 2.15.  Immunofixation shows IgG lambda and IgM kappa. - Reviewed labs today which showed normal LFTs and creatinine improved to 1.05.  CBC was grossly normal. - Continue Revlimid 15 mg 2 weeks on/1 week off.  Next cycle to start on 01/30/2022. - Will start cycle 3 today with Darzalex every 2 weeks. - Will start her on dexamethasone 20 mg once weekly on the day of Darzalex treatment. - RTC 4 weeks for follow-up with repeat myeloma labs.  2.  Anemia from CKD and iron deficiency: - Status post Venofer completed on 11/13/2021.  Hemoglobin is improved to 11.9.  3.  ID prophylaxis: - Continue acyclovir twice daily.  Continue aspirin 81 mg daily.  4.  Bone protection: - Will order DEXA scan prior to next visit.  5.  Hypokalemia: - Continue potassium 40 mEq daily.  Potassium is 3.6.      Orders placed this encounter:  Orders Placed This Encounter  Procedures   DG Bone Density        Derek Jack, MD [place] [phone]

## 2022-01-27 NOTE — Progress Notes (Signed)
Patient presents today for Daratumumab per providers order.  Vital signs within parameters for treatment.  Patient has no new complaints at this time. Stable during administration without incident; injection site WNL; see MAR for injection details.  Patient tolerated procedure well and without incident.  No questions or complaints noted at this time.

## 2022-01-27 NOTE — Progress Notes (Signed)
Patient is taking Revlimid as prescribed.  She has not missed any doses and reports no side effects at this time.    Patient has been examined by Dr. Katragadda, and vital signs and labs have been reviewed. ANC, Creatinine, LFTs, hemoglobin, and platelets are within treatment parameters per M.D. - pt may proceed with treatment.  Primary RN and pharmacy notified.  

## 2022-01-27 NOTE — Patient Instructions (Signed)
[facility]  Discharge Instructions: Thank you for choosing [facility] to provide your oncology and hematology care.  If you have a lab appointment with the [facility], please come in thru the Main Entrance and check in at the main information desk.  Wear comfortable clothing and clothing appropriate for easy access to any Portacath or PICC line.   We strive to give you quality time with your provider. You may need to reschedule your appointment if you arrive late (15 or more minutes).  Arriving late affects you and other patients whose appointments are after yours.  Also, if you miss three or more appointments without notifying the office, you may be dismissed from the clinic at the provider's discretion.      For prescription refill requests, have your pharmacy contact our office and allow 72 hours for refills to be completed.    Today you received the following chemotherapy and/or immunotherapy agents Daratumumab      To help prevent nausea and vomiting after your treatment, we encourage you to take your nausea medication as directed.  BELOW ARE SYMPTOMS THAT SHOULD BE REPORTED IMMEDIATELY: *FEVER GREATER THAN 100.4 F (38 C) OR HIGHER *CHILLS OR SWEATING *NAUSEA AND VOMITING THAT IS NOT CONTROLLED WITH YOUR NAUSEA MEDICATION *UNUSUAL SHORTNESS OF BREATH *UNUSUAL BRUISING OR BLEEDING *URINARY PROBLEMS (pain or burning when urinating, or frequent urination) *BOWEL PROBLEMS (unusual diarrhea, constipation, pain near the anus) TENDERNESS IN MOUTH AND THROAT WITH OR WITHOUT PRESENCE OF ULCERS (sore throat, sores in mouth, or a toothache) UNUSUAL RASH, SWELLING OR PAIN  UNUSUAL VAGINAL DISCHARGE OR ITCHING   Items with * indicate a potential emergency and should be followed up as soon as possible or go to the Emergency Department if any problems should occur.  Please show the CHEMOTHERAPY ALERT CARD or IMMUNOTHERAPY ALERT CARD at check-in to the  Emergency Department and triage nurse.  Should you have questions after your visit or need to cancel or reschedule your appointment, please contact [facility] [phone]  and follow the prompts.  Office hours are 8:00 a.m. to 4:30 p.m. Monday - Friday. Please note that voicemails left after 4:00 p.m. may not be returned until the following business day.  We are closed weekends and major holidays. You have access to a nurse at all times for urgent questions. Please call the main number to the clinic [phone] and follow the prompts.  For any non-urgent questions, you may also contact your provider using MyChart. We now offer e-Visits for anyone 84 and older to request care online for non-urgent symptoms. For details visit [email or web address].   Also download the MyChart app! Go to the app store, search "MyChart", open the app, select [facility], and log in with your MyChart username and password.  Masks are optional in the cancer centers. If you would like for your care team to wear a mask while they are taking care of you, please let them know. You may have one support person who is at least 86 years old accompany you for your appointments.

## 2022-01-27 NOTE — Patient Instructions (Addendum)
[place] at [facility] Discharge Instructions   You were seen and examined today by Dr. Delton Coombes.  He reviewed your lab work which are normal/stable.  We will proceed with your treatment today.  We will send a prescription to your pharmacy for a steroid that you will take once a week every Wednesday.  We will obtain a bone density test prior to your next office visit.  Return as scheduled.    Thank you for choosing [place] at [facility] to provide your oncology and hematology care.  To afford each patient quality time with our provider, please arrive at least 15 minutes before your scheduled appointment time.   If you have a lab appointment with the [facility] please come in thru the Main Entrance and check in at the main information desk.  You need to re-schedule your appointment should you arrive 10 or more minutes late.  We strive to give you quality time with our providers, and arriving late affects you and other patients whose appointments are after yours.  Also, if you no show three or more times for appointments you may be dismissed from the clinic at the providers discretion.     Again, thank you for choosing [facility].  Our hope is that these requests will decrease the amount of time that you wait before being seen by our physicians.       _____________________________________________________________  Should you have questions after your visit to [facility], please contact our office at [phone] and follow the prompts.  Our office hours are 8:00 a.m. and 4:30 p.m. Monday - Friday.  Please note that voicemails left after 4:00 p.m. may not be returned until the following business day.  We are closed weekends and major holidays.  You do have access to a nurse 24-7, just call the main number to the clinic [phone] and do not press any options, hold on the line and a nurse will answer the  phone.    For prescription refill requests, have your pharmacy contact our office and allow 72 hours.    Due to Covid, you will need to wear a mask upon entering the hospital. If you do not have a mask, a mask will be given to you at the Main Entrance upon arrival. For doctor visits, patients may have 1 support person age 72 or older with them. For treatment visits, patients can not have anyone with them due to social distancing guidelines and our immunocompromised population.

## 2022-01-27 NOTE — Progress Notes (Signed)
Patients port flushed without difficulty.  Good blood return noted with no bruising or swelling noted at site. Patient remains accessed.  

## 2022-02-05 ENCOUNTER — Other Ambulatory Visit: Payer: Self-pay

## 2022-02-07 ENCOUNTER — Other Ambulatory Visit: Payer: Self-pay

## 2022-02-10 ENCOUNTER — Inpatient Hospital Stay: Payer: Medicare Other

## 2022-02-10 ENCOUNTER — Other Ambulatory Visit: Payer: Self-pay

## 2022-02-10 VITALS — HR 85

## 2022-02-10 DIAGNOSIS — Z7961 Long term (current) use of immunomodulator: Secondary | ICD-10-CM | POA: Diagnosis not present

## 2022-02-10 DIAGNOSIS — C9000 Multiple myeloma not having achieved remission: Secondary | ICD-10-CM

## 2022-02-10 DIAGNOSIS — Z79624 Long term (current) use of inhibitors of nucleotide synthesis: Secondary | ICD-10-CM | POA: Diagnosis not present

## 2022-02-10 DIAGNOSIS — D631 Anemia in chronic kidney disease: Secondary | ICD-10-CM | POA: Diagnosis not present

## 2022-02-10 DIAGNOSIS — E876 Hypokalemia: Secondary | ICD-10-CM | POA: Diagnosis not present

## 2022-02-10 DIAGNOSIS — Z7982 Long term (current) use of aspirin: Secondary | ICD-10-CM | POA: Diagnosis not present

## 2022-02-10 DIAGNOSIS — D509 Iron deficiency anemia, unspecified: Secondary | ICD-10-CM | POA: Diagnosis not present

## 2022-02-10 DIAGNOSIS — I739 Peripheral vascular disease, unspecified: Secondary | ICD-10-CM | POA: Diagnosis not present

## 2022-02-10 DIAGNOSIS — Z9221 Personal history of antineoplastic chemotherapy: Secondary | ICD-10-CM | POA: Diagnosis not present

## 2022-02-10 DIAGNOSIS — Z7902 Long term (current) use of antithrombotics/antiplatelets: Secondary | ICD-10-CM | POA: Diagnosis not present

## 2022-02-10 DIAGNOSIS — Z5112 Encounter for antineoplastic immunotherapy: Secondary | ICD-10-CM | POA: Diagnosis not present

## 2022-02-10 DIAGNOSIS — Z79899 Other long term (current) drug therapy: Secondary | ICD-10-CM | POA: Diagnosis not present

## 2022-02-10 DIAGNOSIS — I129 Hypertensive chronic kidney disease with stage 1 through stage 4 chronic kidney disease, or unspecified chronic kidney disease: Secondary | ICD-10-CM | POA: Diagnosis not present

## 2022-02-10 DIAGNOSIS — E7800 Pure hypercholesterolemia, unspecified: Secondary | ICD-10-CM | POA: Diagnosis not present

## 2022-02-10 DIAGNOSIS — N189 Chronic kidney disease, unspecified: Secondary | ICD-10-CM | POA: Diagnosis not present

## 2022-02-10 LAB — COMPREHENSIVE METABOLIC PANEL
ALT: 12 U/L (ref 0–44)
AST: 13 U/L — ABNORMAL LOW (ref 15–41)
Albumin: 3.2 g/dL — ABNORMAL LOW (ref 3.5–5.0)
Alkaline Phosphatase: 68 U/L (ref 38–126)
Anion gap: 7 (ref 5–15)
BUN: 9 mg/dL (ref 8–23)
CO2: 22 mmol/L (ref 22–32)
Calcium: 8.8 mg/dL — ABNORMAL LOW (ref 8.9–10.3)
Chloride: 110 mmol/L (ref 98–111)
Creatinine, Ser: 1 mg/dL (ref 0.44–1.00)
GFR, Estimated: 54 mL/min — ABNORMAL LOW (ref >60)
Glucose, Bld: 118 mg/dL — ABNORMAL HIGH (ref 70–99)
Potassium: 3.6 mmol/L (ref 3.5–5.1)
Sodium: 139 mmol/L (ref 135–145)
Total Bilirubin: 0.6 mg/dL (ref 0.3–1.2)
Total Protein: 6.2 g/dL — ABNORMAL LOW (ref 6.5–8.1)

## 2022-02-10 LAB — CBC WITH DIFFERENTIAL/PLATELET
Abs Immature Granulocytes: 0.04 10*3/uL (ref 0.00–0.07)
Basophils Absolute: 0.1 10*3/uL (ref 0.0–0.1)
Basophils Relative: 1 %
Eosinophils Absolute: 0.2 10*3/uL (ref 0.0–0.5)
Eosinophils Relative: 2 %
HCT: 38.8 % (ref 36.0–46.0)
Hemoglobin: 12.3 g/dL (ref 12.0–15.0)
Immature Granulocytes: 1 %
Lymphocytes Relative: 38 %
Lymphs Abs: 3 10*3/uL (ref 0.7–4.0)
MCH: 29.8 pg (ref 26.0–34.0)
MCHC: 31.7 g/dL (ref 30.0–36.0)
MCV: 93.9 fL (ref 80.0–100.0)
Monocytes Absolute: 1.2 10*3/uL — ABNORMAL HIGH (ref 0.1–1.0)
Monocytes Relative: 16 %
Neutro Abs: 3.3 10*3/uL (ref 1.7–7.7)
Neutrophils Relative %: 42 %
Platelets: 433 10*3/uL — ABNORMAL HIGH (ref 150–400)
RBC: 4.13 MIL/uL (ref 3.87–5.11)
RDW: 18.7 % — ABNORMAL HIGH (ref 11.5–15.5)
WBC: 7.7 10*3/uL (ref 4.0–10.5)
nRBC: 0.3 % — ABNORMAL HIGH (ref 0.0–0.2)

## 2022-02-10 LAB — MAGNESIUM: Magnesium: 1.8 mg/dL (ref 1.7–2.4)

## 2022-02-10 MED ORDER — DIPHENHYDRAMINE HCL 25 MG PO CAPS
25.0000 mg | ORAL_CAPSULE | Freq: Once | ORAL | Status: AC
  Administered 2022-02-10: 25 mg via ORAL
  Filled 2022-02-10: qty 1

## 2022-02-10 MED ORDER — DARATUMUMAB-HYALURONIDASE-FIHJ 1800-30000 MG-UT/15ML ~~LOC~~ SOLN
1800.0000 mg | Freq: Once | SUBCUTANEOUS | Status: AC
  Administered 2022-02-10: 1800 mg via SUBCUTANEOUS
  Filled 2022-02-10: qty 15

## 2022-02-10 MED ORDER — SODIUM CHLORIDE 0.9% FLUSH
10.0000 mL | Freq: Once | INTRAVENOUS | Status: AC
  Administered 2022-02-10: 10 mL via INTRAVENOUS

## 2022-02-10 MED ORDER — ACETAMINOPHEN 325 MG PO TABS
650.0000 mg | ORAL_TABLET | Freq: Once | ORAL | Status: AC
  Administered 2022-02-10: 650 mg via ORAL
  Filled 2022-02-10: qty 2

## 2022-02-10 MED ORDER — HEPARIN SOD (PORK) LOCK FLUSH 100 UNIT/ML IV SOLN
500.0000 [IU] | Freq: Once | INTRAVENOUS | Status: AC
  Administered 2022-02-10: 500 [IU] via INTRAVENOUS

## 2022-02-10 MED ORDER — SODIUM CHLORIDE 0.9% FLUSH
10.0000 mL | INTRAVENOUS | Status: DC | PRN
  Administered 2022-02-10: 10 mL via INTRAVENOUS

## 2022-02-10 NOTE — Progress Notes (Signed)
Patients port flushed without difficulty.  Good blood return noted with no bruising or swelling noted at site.  Stable during access and blood draw.  Patient to remain accessed for treatment. 

## 2022-02-10 NOTE — Patient Instructions (Signed)
[name]  Discharge Instructions: Thank you for choosing [facility] to provide your oncology and hematology care.  If you have a lab appointment with the [facility], please come in thru the Main Entrance and check in at the main information desk.  Wear comfortable clothing and clothing appropriate for easy access to any Portacath or PICC line.   We strive to give you quality time with your provider. You may need to reschedule your appointment if you arrive late (15 or more minutes).  Arriving late affects you and other patients whose appointments are after yours.  Also, if you miss three or more appointments without notifying the office, you may be dismissed from the clinic at the provider's discretion.      For prescription refill requests, have your pharmacy contact our office and allow 72 hours for refills to be completed.    Today you received the following chemotherapy and/or immunotherapy agents Leslie Williamson   To help prevent nausea and vomiting after your treatment, we encourage you to take your nausea medication as directed.  Daratumumab; Hyaluronidase Injection What is this medication? DARATUMUMAB; HYALURONIDASE (dar a toom ue mab; hye al ur ON i dase) treats multiple myeloma, a type of bone marrow cancer. Daratumumab works by blocking a protein that causes cancer cells to grow and multiply. This helps to slow or stop the spread of cancer cells. Hyaluronidase works by increasing the absorption of other medications in the body to help them work better. This medication may also be used treat amyloidosis, a condition that causes the buildup of a protein (amyloid) in your body. It works by reducing the buildup of this protein, which decreases symptoms. It is a combination medication that contains a monoclonal antibody. This medicine may be used for other purposes; ask your health care provider or pharmacist if you have questions. COMMON BRAND NAME(S):  DARZALEX FASPRO What should I tell my care team before I take this medication? They need to know if you have any of these conditions: Heart disease Infection, such as chickenpox, cold sores, herpes, hepatitis B Lung or breathing disease An unusual or allergic reaction to daratumumab, hyaluronidase, other medications, foods, dyes, or preservatives Pregnant or trying to get pregnant Breast-feeding How should I use this medication? This medication is injected under the skin. It is given by your care team in a hospital or clinic setting. Talk to your care team about the use of this medication in children. Special care may be needed. Overdosage: If you think you have taken too much of this medicine contact a poison control center or emergency room at once. NOTE: This medicine is only for you. Do not share this medicine with others. What if I miss a dose? Keep appointments for follow-up doses. It is important not to miss your dose. Call your care team if you are unable to keep an appointment. What may interact with this medication? Interactions have not been studied. This list may not describe all possible interactions. Give your health care provider a list of all the medicines, herbs, non-prescription drugs, or dietary supplements you use. Also tell them if you smoke, drink alcohol, or use illegal drugs. Some items may interact with your medicine. What should I watch for while using this medication? Your condition will be monitored carefully while you are receiving this medication. This medication can cause serious allergic reactions. To reduce your risk, your care team may give you other medication to take before receiving this  one. Be sure to follow the directions from your care team. This medication can affect the results of blood tests to match your blood type. These changes can last for up to 6 months after the final dose. Your care team will do blood tests to match your blood type before you  start treatment. Tell all of your care team that you are being treated with this medication before receiving a blood transfusion. This medication can affect the results of some tests used to determine treatment response; extra tests may be needed to evaluate response. Talk to your care team if you wish to become pregnant or think you are pregnant. This medication can cause serious birth defects if taken during pregnancy and for 3 months after the last dose. A reliable form of contraception is recommended while taking this medication and for 3 months after the last dose. Talk to your care team about effective forms of contraception. Do not breast-feed while taking this medication. What side effects may I notice from receiving this medication? Side effects that you should report to your care team as soon as possible: Allergic reactions--skin rash, itching, hives, swelling of the face, lips, tongue, or throat Heart rhythm changes--fast or irregular heartbeat, dizziness, feeling faint or lightheaded, chest pain, trouble breathing Infection--fever, chills, cough, sore throat, wounds that don't heal, pain or trouble when passing urine, general feeling of discomfort or being unwell Infusion reactions--chest pain, shortness of breath or trouble breathing, feeling faint or lightheaded Sudden eye pain or change in vision such as blurry vision, seeing halos around lights, vision loss Unusual bruising or bleeding Side effects that usually do not require medical attention (report to your care team if they continue or are bothersome): Constipation Diarrhea Fatigue Nausea Pain, tingling, or numbness in the hands or feet Swelling of the ankles, hands, or feet This list may not describe all possible side effects. Call your doctor for medical advice about side effects. You may report side effects to FDA at 1-800-FDA-1088. Where should I keep my medication? This medication is given in a hospital or clinic. It will  not be stored at home. NOTE: This sheet is a summary. It may not cover all possible information. If you have questions about this medicine, talk to your doctor, pharmacist, or health care provider.  2023 Elsevier/Gold Standard (2021-06-03 00:00:00)   BELOW ARE SYMPTOMS THAT SHOULD BE REPORTED IMMEDIATELY: *FEVER GREATER THAN 100.4 F (38 C) OR HIGHER *CHILLS OR SWEATING *NAUSEA AND VOMITING THAT IS NOT CONTROLLED WITH YOUR NAUSEA MEDICATION *UNUSUAL SHORTNESS OF BREATH *UNUSUAL BRUISING OR BLEEDING *URINARY PROBLEMS (pain or burning when urinating, or frequent urination) *BOWEL PROBLEMS (unusual diarrhea, constipation, pain near the anus) TENDERNESS IN MOUTH AND THROAT WITH OR WITHOUT PRESENCE OF ULCERS (sore throat, sores in mouth, or a toothache) UNUSUAL RASH, SWELLING OR PAIN  UNUSUAL VAGINAL DISCHARGE OR ITCHING   Items with * indicate a potential emergency and should be followed up as soon as possible or go to the Emergency Department if any problems should occur.  Please show the CHEMOTHERAPY ALERT CARD or IMMUNOTHERAPY ALERT CARD at check-in to the Emergency Department and triage nurse.  Should you have questions after your visit or need to cancel or reschedule your appointment, please contact [facility] [phone]  and follow the prompts.  Office hours are 8:00 a.m. to 4:30 p.m. Monday - Friday. Please note that voicemails left after 4:00 p.m. may not be returned until the following business day.  We are  closed weekends and major holidays. You have access to a nurse at all times for urgent questions. Please call the main number to the clinic [phone] and follow the prompts.  For any non-urgent questions, you may also contact your provider using MyChart. We now offer e-Visits for anyone 72 and older to request care online for non-urgent symptoms. For details visit [email or web address].   Also download the MyChart app! Go to the app store, search  "MyChart", open the app, select [facility], and log in with your MyChart username and password.  Masks are optional in the cancer centers. If you would like for your care team to wear a mask while they are taking care of you, please let them know. You may have one support person who is at least 86 years old accompany you for your appointments.

## 2022-02-10 NOTE — Progress Notes (Signed)
Pt presents today for Leslie Williamson per provider's order. Vital signs and labs WNL for treatment. Okay to proceed with treatment today.   Pt took Dexamethasone  at home prior to arrival.  [facility] given today per MD orders. Tolerated infusion without adverse affects. Vital signs stable. No complaints at this time. Discharged from clinic ambulatory in stable condition. Alert and oriented x 3. F/U with [facility] as scheduled.

## 2022-02-11 LAB — KAPPA/LAMBDA LIGHT CHAINS
Kappa free light chain: 53.8 mg/L — ABNORMAL HIGH (ref 3.3–19.4)
Kappa, lambda light chain ratio: 2.49 — ABNORMAL HIGH (ref 0.26–1.65)
Lambda free light chains: 21.6 mg/L (ref 5.7–26.3)

## 2022-02-12 LAB — PROTEIN ELECTROPHORESIS, SERUM
A/G Ratio: 1.3 (ref 0.7–1.7)
Albumin ELP: 3 g/dL (ref 2.9–4.4)
Alpha-1-Globulin: 0.3 g/dL (ref 0.0–0.4)
Alpha-2-Globulin: 0.7 g/dL (ref 0.4–1.0)
Beta Globulin: 0.8 g/dL (ref 0.7–1.3)
Gamma Globulin: 0.5 g/dL (ref 0.4–1.8)
Globulin, Total: 2.3 g/dL (ref 2.2–3.9)
M-Spike, %: 0.3 g/dL — ABNORMAL HIGH
Total Protein ELP: 5.3 g/dL — ABNORMAL LOW (ref 6.0–8.5)

## 2022-02-16 LAB — IMMUNOFIXATION ELECTROPHORESIS
IgA: 180 mg/dL (ref 64–422)
IgG (Immunoglobin G), Serum: 597 mg/dL (ref 586–1602)
IgM (Immunoglobulin M), Srm: 61 mg/dL (ref 26–217)
Total Protein ELP: 5.3 g/dL — ABNORMAL LOW (ref 6.0–8.5)

## 2022-02-17 ENCOUNTER — Other Ambulatory Visit: Payer: Self-pay

## 2022-02-17 ENCOUNTER — Ambulatory Visit (HOSPITAL_COMMUNITY)
Admission: RE | Admit: 2022-02-17 | Discharge: 2022-02-17 | Disposition: A | Discharge status: Home / Self Care | Payer: Medicare Other | Source: Ambulatory Visit | Attending: Hematology | Admitting: Hematology

## 2022-02-17 DIAGNOSIS — C9000 Multiple myeloma not having achieved remission: Secondary | ICD-10-CM | POA: Diagnosis not present

## 2022-02-17 DIAGNOSIS — Z13820 Encounter for screening for osteoporosis: Secondary | ICD-10-CM | POA: Diagnosis not present

## 2022-02-17 DIAGNOSIS — Z780 Asymptomatic menopausal state: Secondary | ICD-10-CM | POA: Diagnosis not present

## 2022-02-17 MED ORDER — LENALIDOMIDE 15 MG PO CAPS: 15.0000 mg | ORAL_CAPSULE | Freq: Every day | ORAL | 0 refills | Status: DC

## 2022-02-17 NOTE — Telephone Encounter (Signed)
Chart reviewed. Revlimid refilled per verbal order from Dr. Katragadda. 

## 2022-02-19 ENCOUNTER — Other Ambulatory Visit: Payer: Self-pay

## 2022-02-19 DIAGNOSIS — M130 Polyarthritis, unspecified: Secondary | ICD-10-CM | POA: Diagnosis not present

## 2022-02-19 DIAGNOSIS — M542 Cervicalgia: Secondary | ICD-10-CM | POA: Diagnosis not present

## 2022-02-19 DIAGNOSIS — D473 Essential (hemorrhagic) thrombocythemia: Secondary | ICD-10-CM | POA: Diagnosis not present

## 2022-02-19 DIAGNOSIS — C9002 Multiple myeloma in relapse: Secondary | ICD-10-CM | POA: Diagnosis not present

## 2022-02-23 ENCOUNTER — Encounter: Payer: Self-pay | Admitting: Hematology

## 2022-02-24 ENCOUNTER — Inpatient Hospital Stay: Payer: Medicare Other | Attending: Hematology

## 2022-02-24 ENCOUNTER — Ambulatory Visit: Payer: Medicare Other

## 2022-02-24 ENCOUNTER — Inpatient Hospital Stay: Payer: Medicare Other

## 2022-02-24 ENCOUNTER — Inpatient Hospital Stay (HOSPITAL_BASED_OUTPATIENT_CLINIC_OR_DEPARTMENT_OTHER): Payer: Medicare Other | Admitting: Hematology

## 2022-02-24 ENCOUNTER — Other Ambulatory Visit: Payer: Medicare Other

## 2022-02-24 ENCOUNTER — Ambulatory Visit: Payer: Medicare Other | Admitting: Hematology

## 2022-02-24 VITALS — BP 154/100 | HR 82 | Temp 97.6°F | Resp 18 | Ht 60.0 in | Wt 141.6 lb

## 2022-02-24 DIAGNOSIS — E7800 Pure hypercholesterolemia, unspecified: Secondary | ICD-10-CM | POA: Insufficient documentation

## 2022-02-24 DIAGNOSIS — Z8572 Personal history of non-Hodgkin lymphomas: Secondary | ICD-10-CM | POA: Insufficient documentation

## 2022-02-24 DIAGNOSIS — E611 Iron deficiency: Secondary | ICD-10-CM | POA: Insufficient documentation

## 2022-02-24 DIAGNOSIS — Z5111 Encounter for antineoplastic chemotherapy: Secondary | ICD-10-CM | POA: Diagnosis not present

## 2022-02-24 DIAGNOSIS — Z9221 Personal history of antineoplastic chemotherapy: Secondary | ICD-10-CM | POA: Diagnosis not present

## 2022-02-24 DIAGNOSIS — M542 Cervicalgia: Secondary | ICD-10-CM | POA: Insufficient documentation

## 2022-02-24 DIAGNOSIS — Z7961 Long term (current) use of immunomodulator: Secondary | ICD-10-CM | POA: Insufficient documentation

## 2022-02-24 DIAGNOSIS — D631 Anemia in chronic kidney disease: Secondary | ICD-10-CM | POA: Diagnosis not present

## 2022-02-24 DIAGNOSIS — E876 Hypokalemia: Secondary | ICD-10-CM | POA: Insufficient documentation

## 2022-02-24 DIAGNOSIS — D473 Essential (hemorrhagic) thrombocythemia: Secondary | ICD-10-CM | POA: Insufficient documentation

## 2022-02-24 DIAGNOSIS — Z7982 Long term (current) use of aspirin: Secondary | ICD-10-CM | POA: Insufficient documentation

## 2022-02-24 DIAGNOSIS — N189 Chronic kidney disease, unspecified: Secondary | ICD-10-CM | POA: Diagnosis not present

## 2022-02-24 DIAGNOSIS — I129 Hypertensive chronic kidney disease with stage 1 through stage 4 chronic kidney disease, or unspecified chronic kidney disease: Secondary | ICD-10-CM | POA: Insufficient documentation

## 2022-02-24 DIAGNOSIS — D509 Iron deficiency anemia, unspecified: Secondary | ICD-10-CM | POA: Insufficient documentation

## 2022-02-24 DIAGNOSIS — C9000 Multiple myeloma not having achieved remission: Secondary | ICD-10-CM | POA: Insufficient documentation

## 2022-02-24 DIAGNOSIS — Z87891 Personal history of nicotine dependence: Secondary | ICD-10-CM | POA: Diagnosis not present

## 2022-02-24 DIAGNOSIS — I10 Essential (primary) hypertension: Secondary | ICD-10-CM | POA: Insufficient documentation

## 2022-02-24 DIAGNOSIS — Z79899 Other long term (current) drug therapy: Secondary | ICD-10-CM | POA: Insufficient documentation

## 2022-02-24 DIAGNOSIS — I739 Peripheral vascular disease, unspecified: Secondary | ICD-10-CM | POA: Insufficient documentation

## 2022-02-24 DIAGNOSIS — Z95828 Presence of other vascular implants and grafts: Secondary | ICD-10-CM

## 2022-02-24 DIAGNOSIS — Z9081 Acquired absence of spleen: Secondary | ICD-10-CM | POA: Diagnosis not present

## 2022-02-24 LAB — CBC WITH DIFFERENTIAL/PLATELET
Abs Immature Granulocytes: 0.03 10*3/uL (ref 0.00–0.07)
Basophils Absolute: 0.1 10*3/uL (ref 0.0–0.1)
Basophils Relative: 2 %
Eosinophils Absolute: 0.1 10*3/uL (ref 0.0–0.5)
Eosinophils Relative: 1 %
HCT: 34 % — ABNORMAL LOW (ref 36.0–46.0)
Hemoglobin: 11.1 g/dL — ABNORMAL LOW (ref 12.0–15.0)
Immature Granulocytes: 1 %
Lymphocytes Relative: 29 %
Lymphs Abs: 1.6 10*3/uL (ref 0.7–4.0)
MCH: 29.1 pg (ref 26.0–34.0)
MCHC: 32.6 g/dL (ref 30.0–36.0)
MCV: 89.2 fL (ref 80.0–100.0)
Monocytes Absolute: 1.2 10*3/uL — ABNORMAL HIGH (ref 0.1–1.0)
Monocytes Relative: 21 %
Neutro Abs: 2.5 10*3/uL (ref 1.7–7.7)
Neutrophils Relative %: 46 %
Platelets: 488 10*3/uL — ABNORMAL HIGH (ref 150–400)
RBC: 3.81 MIL/uL — ABNORMAL LOW (ref 3.87–5.11)
RDW: 18.4 % — ABNORMAL HIGH (ref 11.5–15.5)
WBC: 5.4 10*3/uL (ref 4.0–10.5)
nRBC: 0.4 % — ABNORMAL HIGH (ref 0.0–0.2)

## 2022-02-24 MED ORDER — HEPARIN SOD (PORK) LOCK FLUSH 100 UNIT/ML IV SOLN
500.0000 [IU] | Freq: Once | INTRAVENOUS | Status: AC
  Administered 2022-02-24: 500 [IU] via INTRAVENOUS

## 2022-02-24 MED ORDER — SODIUM CHLORIDE 0.9% FLUSH
10.0000 mL | INTRAVENOUS | Status: DC | PRN
  Administered 2022-02-24: 10 mL via INTRAVENOUS

## 2022-02-24 MED ORDER — DIPHENHYDRAMINE HCL 25 MG PO CAPS: 25.0000 mg | ORAL_CAPSULE | Freq: Once | ORAL | Status: DC

## 2022-02-24 MED ORDER — LORATADINE 10 MG PO TABS
10.0000 mg | ORAL_TABLET | Freq: Once | ORAL | Status: AC
  Administered 2022-02-24: 10 mg via ORAL
  Filled 2022-02-24: qty 1

## 2022-02-24 MED ORDER — DARATUMUMAB-HYALURONIDASE-FIHJ 1800-30000 MG-UT/15ML ~~LOC~~ SOLN
1800.0000 mg | Freq: Once | SUBCUTANEOUS | Status: AC
  Administered 2022-02-24: 1800 mg via SUBCUTANEOUS
  Filled 2022-02-24: qty 15

## 2022-02-24 MED ORDER — ACETAMINOPHEN 325 MG PO TABS
650.0000 mg | ORAL_TABLET | Freq: Once | ORAL | Status: AC
  Administered 2022-02-24: 650 mg via ORAL
  Filled 2022-02-24: qty 2

## 2022-02-24 NOTE — Progress Notes (Signed)
Discontinue diphenhydramine oral from premedication of Darzalex Faspro.  Add loratidine 10 mg po x 1 as premedication to Darzalex Faspro for all future treatments.  T.O. Dr Rhys Martini, PharmD

## 2022-02-24 NOTE — Progress Notes (Unsigned)
Labs reviewed , ok to treat per MD.   Patient tool her dexamethazone at home.   Late entry:  Treatment given per orders. Patient tolerated it well without problems. Vitals stable and discharged home from clinic via wheelchair. Follow up as scheduled.

## 2022-02-24 NOTE — Progress Notes (Signed)
Patients port flushed without difficulty.  Good blood return noted with no bruising or swelling noted at site.  Band aid applied.  VSS with discharge and left in satisfactory condition with no s/s of distress noted.   

## 2022-02-24 NOTE — Progress Notes (Signed)
[facility], [place] [address]   CLINIC:  Medical Oncology/Hematology  PCP:  Carrolyn Meiers, MD [place] [id number] [phone]   REASON FOR VISIT:  Follow-up for newly diagnosed multiple myeloma  PRIOR THERAPY: None  NGS Results: Not applicable  CURRENT THERAPY: Daratumumab, Revlimid and dexamethasone  BRIEF ONCOLOGIC HISTORY:  Oncology History  NHL (non-Hodgkin's lymphoma) ([facility])  07/19/1995 Pathology Results   Spleen biopsy with resection- low-grade B-cell lymphoma, splenic marginal zone type   02/20/1998 Imaging   CT CAP-marked and extensive cervical adenopathy   02/25/1998 Pathology Results   Right cervical lymph node demonstrating large B-cell lymphoma   03/05/1998 Bone Marrow Biopsy   No evidence of bone marrow involvement   03/11/1998 - 07/18/1998 Chemotherapy   CHOP x 7 cycles   05/09/1998 Imaging   CT CAP and neck- slight interval decrease in size of para-aortic adenopathy with interval decrease in right inguinal and bi-iliac adenopathy.  Interval decrease in size and number of enlarged cervical nodes   07/10/1998 Imaging   Ct CAP and neck- unremarkable CT of chest. No enlarged retroperitoneal adenopathy withthe previously noted retroperitoneal nodes currently smaller to stable in size.  Cervical adenopathy is stable to slightly smaller in size.   08/18/1998 - 09/08/1998 Chemotherapy   Rituxan Day 1, 8, 15, 22 x 1 cycle   01/08/1999 Remission   CT CAP and neck demonstrates no adenopathy or disease   01/08/1999 Imaging   CT CAP and neck- Stable CT of chest. Stable CT abd/pelvis without adenopathy.  Negative CT of neck   Multiple myeloma ([facility])  11/16/2021 Initial Diagnosis   Multiple myeloma ([facility])   11/30/2021 -  Chemotherapy   Patient is on Treatment Plan : MYELOMA  Daratumumab SQ + Lenalidomide + Dexamethasone (DaraRd) q28d       CANCER STAGING:  Cancer Staging  Multiple myeloma  ([facility]) Staging form: Plasma Cell Myeloma and Plasma Cell Disorders, AJCC 8th Edition - Clinical stage from 11/16/2021: Albumin (g/dL): 3.1, ISS: Stage II, High-risk cytogenetics: Absent, LDH: Normal - Unsigned   INTERVAL HISTORY:  Ms. Senat 89 y.o. female seen for follow-up and toxicity assessment prior to cycle 4 of multiple myeloma.  She is tolerating Revlimid reasonably well.  Energy levels are reported as 70%.  She reports pain in her neck which started in the last couple of weeks.  She denies any injury to the neck region.  REVIEW OF SYSTEMS:  Review of Systems  Gastrointestinal:  Positive for constipation.  All other systems reviewed and are negative.    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   Vertigo, labyrinthine    Past Surgical History:  Procedure Laterality Date   CATARACT EXTRACTION W/PHACO Left 11/03/2015   Procedure: CATARACT EXTRACTION PHACO AND INTRAOCULAR LENS PLACEMENT ([facility]);  Surgeon: Tonny Branch, MD;  Location: [facility];  Service: Ophthalmology;  Laterality: Left;  CDE: 7.74   CATARACT EXTRACTION W/PHACO Right 11/20/2015   Procedure: CATARACT EXTRACTION PHACO AND INTRAOCULAR LENS PLACEMENT ; CDE:  8.30;  Surgeon: Tonny Branch, MD;  Location: [facility];  Service: Ophthalmology;  Laterality: Right;   IR IMAGING GUIDED PORT INSERTION  11/04/2021   PORT-A-CATH REMOVAL       SOCIAL HISTORY:  Social History   Socioeconomic History   Marital status:  Widowed    Spouse name: Not on file   Number of children: Not on file   Years of education: Not on file   Highest education level: Not on file  Occupational History   Not on file  Tobacco Use   Smoking status: Former   Smokeless tobacco: Never  Vaping Use   Vaping Use: Never used  Substance and Sexual Activity   Alcohol use: No   Drug use: No   Sexual activity: Not Currently  Other Topics Concern   Not on file  Social History Narrative   Not on file   Social Determinants of Health   Financial Resource Strain: Low Risk  (12/31/2019)   Overall Financial Resource Strain (CARDIA)    Difficulty of Paying Living Expenses: Not hard at all  Food Insecurity: No Food Insecurity (12/31/2019)   Hunger Vital Sign    Worried About Running Out of Food in the Last Year: Never true    [place] in the Last Year: Never true  Transportation Needs: No Transportation Needs (12/31/2019)   PRAPARE - Hydrologist (Medical): No    Lack of Transportation (Non-Medical): No  Physical Activity: Inactive (12/31/2019)   Exercise Vital Sign    Days of Exercise per Week: 0 days    Minutes of Exercise per Session: 0 min  Stress: No Stress Concern Present (12/31/2019)   [place]    Feeling of Stress : Not at all  Social Connections: Moderately Isolated (12/31/2019)   Social Connection and Isolation Panel [NHANES]    Frequency of Communication with Friends and Family: More than three times a week    Frequency of Social Gatherings with Friends and Family: More than three times a week    Attends Religious Services: More than 4 times per year    Active Member of [employer] or Organizations: No    Attends Archivist Meetings: Never    Marital Status: Widowed  Intimate Partner Violence: Not At Risk (12/31/2019)   Humiliation, Afraid, Rape, and Kick questionnaire    Fear of Current or Ex-Partner: No     Emotionally Abused: No    Physically Abused: No    Sexually Abused: No    FAMILY HISTORY:  Family History  Problem Relation Age of Onset   Diabetes Mother     CURRENT MEDICATIONS:  Outpatient Encounter Medications as of 02/24/2022  Medication Sig   acyclovir (ZOVIRAX) 400 MG tablet Take 1 tablet (400 mg total) by mouth 2 (two) times daily.   amLODipine (NORVASC) 5 MG tablet Take 1 tablet (5 mg total) by mouth daily.   aspirin 81 MG tablet Take 81 mg by mouth daily.   atorvastatin (LIPITOR) 40 MG tablet Take 40 mg by mouth daily.   baclofen (LIORESAL) 10 MG tablet Take 10 mg by mouth 2 (two) times daily.   busPIRone (BUSPAR) 5 MG tablet Take 5 mg by mouth at bedtime as needed (anxiety/sleep).   cilostazol (PLETAL) 100 MG  tablet Take 100 mg by mouth 2 (two) times daily.   dexamethasone (DECADRON) 4 MG tablet Take 20 mg (5 tablets) by mouth weekly every Wednesday morning   ferrous sulfate 325 (65 FE) MG EC tablet Take 1 tablet (325 mg total) by mouth daily with breakfast.   lenalidomide (REVLIMID) 15 MG capsule Take 1 capsule (15 mg total) by mouth daily. Take for 21 days on, 7 days off.   potassium chloride SA (KLOR-CON M) 20 MEQ tablet Take 2 tablets (40 mEq total) by mouth daily.   tiZANidine (ZANAFLEX) 2 MG tablet Take 2 mg by mouth 2 (two) times daily.   traMADol (ULTRAM) 50 MG tablet Take 50 mg by mouth every 4 (four) hours as needed.   traZODone (DESYREL) 50 MG tablet Take 50 mg by mouth at bedtime as needed.   lidocaine-prilocaine (EMLA) cream Apply 1 Application topically as needed. (Patient not taking: Reported on 02/24/2022)   prochlorperazine (COMPAZINE) 10 MG tablet Take by mouth. (Patient not taking: Reported on 02/24/2022)   prochlorperazine (COMPAZINE) 5 MG tablet Take 1 tablet (5 mg total) by mouth every 6 (six) hours as needed for nausea or vomiting. (Patient not taking: Reported on 02/24/2022)   [EXPIRED] heparin lock flush 100 unit/mL    [DISCONTINUED] sodium chloride flush  (NS) 0.9 % injection 10 mL    No facility-administered encounter medications on file as of 02/24/2022.    ALLERGIES:  Allergies  Allergen Reactions   Penicillins Rash     PHYSICAL EXAM:  ECOG Performance status: 1  There were no vitals filed for this visit.  There were no vitals filed for this visit.  Physical Exam Vitals reviewed.  Constitutional:      Appearance: Normal appearance.  Cardiovascular:     Rate and Rhythm: Normal rate and regular rhythm.     Heart sounds: Normal heart sounds.  Pulmonary:     Breath sounds: Normal breath sounds.  Abdominal:     Palpations: Abdomen is soft. There is no mass.  Neurological:     General: No focal deficit present.     Mental Status: She is alert and oriented to person, place, and time.  Psychiatric:        Mood and Affect: Mood normal.        Behavior: Behavior normal.      LABORATORY DATA:  I have reviewed the labs as listed.  CBC    Component Value Date/Time   WBC 5.4 02/24/2022 1023   RBC 3.81 (L) 02/24/2022 1023   HGB 11.1 (L) 02/24/2022 1023   HCT 34.0 (L) 02/24/2022 1023   PLT 488 (H) 02/24/2022 1023   MCV 89.2 02/24/2022 1023   MCH 29.1 02/24/2022 1023   MCHC 32.6 02/24/2022 1023   RDW 18.4 (H) 02/24/2022 1023   LYMPHSABS 1.6 02/24/2022 1023   MONOABS 1.2 (H) 02/24/2022 1023   EOSABS 0.1 02/24/2022 1023   BASOSABS 0.1 02/24/2022 1023      Latest Ref Rng & Units 02/10/2022   11:36 AM 01/27/2022    9:37 AM 01/13/2022    8:02 AM  CMP  Glucose 70 - 99 mg/dL 118  127  117   BUN 8 - 23 mg/dL _0 Creatinine 0.44 - 1.00 mg/dL 1.00  1.05  1.08   Sodium 135 - 145 mmol/L 139  137  137   Potassium 3.5 - 5.1 mmol/L 3.6  3.6  3.8   Chloride 98 - 111 mmol/L 110  110  110   CO2 22 - 32 mmol/L _0 Calcium 8.9 - 10.3 mg/dL 8.8  8.0  8.3   Total Protein 6.5 - 8.1 g/dL 6.2  5.3  5.4   Total Bilirubin 0.3 - 1.2 mg/dL 0.6  0.6  0.6   Alkaline Phos 38 - 126 U/L 68  62  63   AST 15 - 41 U/L _1 ALT 0 - 44 U/L _2 DIAGNOSTIC IMAGING:  I have independently reviewed the scans and discussed with the patient.  ASSESSMENT:  1.  Biclonal IgG lambda and IgM kappa multiple myeloma, high risk: - BMBX (11/02/2021): IgG lambda plasma cells 70% - Myeloma FISH panel: Monosomy 13 (standard GIST), duplication of 1 q. (high risk), gain of 11 q. (standard risk). - Cytogenetics: Hyperdiploid E weight gain (trisomy/trisomy) of odd-numbered chromosomes and monosomy 13, standard risk.  Gain of 1 q. associated with progressive course. - PET scan (11/05/2021): No evidence of multiple myeloma.  No hypermetabolic adenopathy. - Daratumumab, Revlimid and dexamethasone cycle 1 started on 11/30/2021  2.  JAK2 positive essential thrombocytosis: - BMBX (11/02/2021): No evidence of fibrosis.  No morphological evidence of JAK2 positive ET.  3.  History of marginal zone lymphoma of the spleen: - Diagnosed in 1999, status post CHOP x7 cycles followed by rituximab 1 cycle. - Status post splenectomy.  No evidence of lymphoma on current PET scan.  4.  Social/family history: - She lives with her sister.  Son lives 30 minutes away.  She is independent of ADLs and some IADLs.  She drives.  She worked at [employer] and denies any exposure to chemicals or pesticides.  No family history of malignancies.    PLAN:  1.  IgG lambda multiple myeloma: - She is tolerating Revlimid, Darzalex and dexamethasone very well. - Myeloma panel from 02/10/2022 was reviewed.  M spike improved to 0.3 g.  Free light chain ratio is 2.49 and kappa light chain slightly increased 53.8.  Immunofixation shows IgG lambda and IgM kappa proteins. - Recommend proceeding with C4 D1 today. - She will restart Revlimid on 03/02/2022. - She reported neck pain in the last 2 to 3 weeks which is not improving.  Will order MRI of the neck with and without contrast.  Prior PET scan did not show any bone lesions in the neck region.  2.   Anemia from CKD and iron deficiency: - Last Venofer on 11/13/2021.  Hemoglobin today is 11.1.  Will check ferritin and iron panel at next visit.  3.  ID prophylaxis: - Continue acyclovir twice daily and aspirin 81 mg daily.  4.  Bone protection: - Reviewed DEXA scan from 02/17/2022 T-score -0.9, normal. - I talked about starting on zoledronic acid/denosumab.  She will see her dentist prior to the start.  5.  Hypokalemia: - Continue potassium 40 mEq daily.  Last potassium was 3.6.      Orders placed this encounter:  Orders Placed This Encounter  Procedures   MR Cervical Spine W Wo Contrast   Iron and TIBC ([facility] ONLY)   Ferritin       Derek Jack, MD [place] [phone]

## 2022-02-24 NOTE — Progress Notes (Signed)
Patient has been examined by Dr. Katragadda, and vital signs and labs have been reviewed. ANC, Creatinine, LFTs, hemoglobin, and platelets are within treatment parameters per M.D. - pt may proceed with treatment.  Primary RN and pharmacy notified.  

## 2022-02-24 NOTE — Patient Instructions (Signed)
[place] at [facility] Discharge Instructions   You were seen and examined today by Dr. Delton Coombes.  He reviewed the results of your lab work which is normal/stable. Your myeloma numbers have improved.   We will proceed with your treatment today.   Continue taking the 5 pills (steroid) every week.   Return as scheduled.    Thank you for choosing [place] at [facility] to provide your oncology and hematology care.  To afford each patient quality time with our provider, please arrive at least 15 minutes before your scheduled appointment time.   If you have a lab appointment with the [facility] please come in thru the Main Entrance and check in at the main information desk.  You need to re-schedule your appointment should you arrive 10 or more minutes late.  We strive to give you quality time with our providers, and arriving late affects you and other patients whose appointments are after yours.  Also, if you no show three or more times for appointments you may be dismissed from the clinic at the providers discretion.     Again, thank you for choosing [facility].  Our hope is that these requests will decrease the amount of time that you wait before being seen by our physicians.       _____________________________________________________________  Should you have questions after your visit to [facility], please contact our office at [phone] and follow the prompts.  Our office hours are 8:00 a.m. and 4:30 p.m. Monday - Friday.  Please note that voicemails left after 4:00 p.m. may not be returned until the following business day.  We are closed weekends and major holidays.  You do have access to a nurse 24-7, just call the main number to the clinic [phone] and do not press any options, hold on the line and a nurse will answer the phone.    For prescription refill requests, have your pharmacy contact our  office and allow 72 hours.    Due to Covid, you will need to wear a mask upon entering the hospital. If you do not have a mask, a mask will be given to you at the Main Entrance upon arrival. For doctor visits, patients may have 1 support person age 87 or older with them. For treatment visits, patients can not have anyone with them due to social distancing guidelines and our immunocompromised population.

## 2022-02-25 ENCOUNTER — Encounter: Payer: Self-pay | Admitting: Hematology

## 2022-02-27 ENCOUNTER — Encounter: Payer: Self-pay | Admitting: Emergency Medicine

## 2022-02-27 ENCOUNTER — Ambulatory Visit
Admission: EM | Admit: 2022-02-27 | Discharge: 2022-02-27 | Disposition: A | Discharge status: Home / Self Care | Payer: BLUE CROSS/BLUE SHIELD | Attending: Nurse Practitioner | Admitting: Nurse Practitioner

## 2022-02-27 DIAGNOSIS — M542 Cervicalgia: Secondary | ICD-10-CM

## 2022-02-27 DIAGNOSIS — Z859 Personal history of malignant neoplasm, unspecified: Secondary | ICD-10-CM

## 2022-02-27 MED ORDER — TRAMADOL HCL 50 MG PO TABS: 50.0000 mg | ORAL_TABLET | Freq: Two times a day (BID) | ORAL | 0 refills | Status: DC | PRN

## 2022-02-27 NOTE — ED Provider Notes (Signed)
[facility]    CSN: [id number] Arrival date & time: 02/27/22  1531      History   Chief Complaint No chief complaint on file.   HPI Leslie Williamson is a 89 y.o. female.   The history is provided by the patient and a relative.   The patient presents for complaints of neck pain has been present over the past 3 weeks.  Patient states over the last several days, her neck pain has worsened.  Patient reports she does have a history of multiple myeloma.  Recent port placement was in October 2023.  Patient states the pain around her neck is now radiating across her shoulders.  Patient states it is difficult for her to look up and to look down and to turn her head.  She denies numbness or tingling in her hands or arms.  She states that she did see her PCP and a CT scan was scheduled for 1/17.  Patient was also prescribed tramadol 50 mg at that time, but she states that when she went to take them, she dropped the entire bottle.  Patient states that her pain has become markedly worse, and she cannot wait.  Patient orts that she has been taking Tylenol for pain or discomfort.  She also reports that she has been using ice or heat. 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   Vertigo, labyrinthine     Patient Active Problem List   Diagnosis Date Noted   Drug-induced neutropenia ([place]) 01/20/2022   Multiple myeloma ([place]) 11/16/2021   Acute renal failure superimposed on stage 3a chronic kidney  disease ([place]) 08/06/2021   Essential thrombocytosis ([place]) 08/06/2021   MGUS (monoclonal gammopathy of unknown significance) 08/06/2021   COVID-19 virus infection 08/05/2021   Acute respiratory failure with hypoxia ([place]) 08/05/2021   Occult blood positive stool 10/23/2020   Labyrinthine vertigo 10/26/2015   Elevated troponin 10/25/2015   Chest pain 10/25/2015   Vertigo    S/P splenectomy 01/01/2014   Claudication of both lower extremities ([place]) 11/06/2013   NHL (non-Hodgkin's lymphoma) ([place]) 12/17/2010   WEIGHT LOSS, ABNORMAL 02/05/2009   COLONIC POLYPS, ADENOMATOUS, HX OF 02/05/2009   GERD 08/08/2008   Iron deficiency anemia 06/12/2008   ANXIETY 03/20/2007   HYPERLIPIDEMIA 01/11/2006   JAK2+ MDP (myeloproliferative disorder) presenting with thrombocytosis ([place]) 01/11/2006   CARPAL TUNNEL SYNDROME 01/11/2006   Essential hypertension 01/11/2006   PVD- know Lt SFA disease, bilat PT and AT disease 01/11/2006   ALLERGIC RHINITIS 01/11/2006   SEBORRHEIC KERATOSIS 01/11/2006    Past Surgical History:  Procedure Laterality Date   CATARACT EXTRACTION W/PHACO Left 11/03/2015   Procedure: CATARACT EXTRACTION PHACO AND INTRAOCULAR LENS PLACEMENT ([place]);  Surgeon: Tonny Branch, MD;  Location: [facility];  Service: Ophthalmology;  Laterality: Left;  CDE: 7.74   CATARACT EXTRACTION W/PHACO Right 11/20/2015   Procedure: CATARACT EXTRACTION PHACO AND INTRAOCULAR LENS PLACEMENT ;  CDE:  8.30;  Surgeon: Tonny Branch, MD;  Location: [facility];  Service: Ophthalmology;  Laterality: Right;   IR IMAGING GUIDED PORT INSERTION  11/04/2021   PORT-A-CATH REMOVAL      OB History   No obstetric history on file.      Home Medications    Prior to Admission medications   Medication Sig Start Date End Date Taking? Authorizing Provider  traMADol (ULTRAM) 50 MG tablet Take 1 tablet (50 mg total) by mouth every 12 (twelve) hours as needed. 02/27/22  Yes Latorie Montesano-Warren, Alda Lea, NP  acyclovir (ZOVIRAX) 400 MG tablet Take  1 tablet (400 mg total) by mouth 2 (two) times daily. 12/30/21   Derek Jack, MD  amLODipine (NORVASC) 5 MG tablet Take 1 tablet (5 mg total) by mouth daily. 08/07/21   Orson Eva, MD  aspirin 81 MG tablet Take 81 mg by mouth daily.    [provider]  atorvastatin (LIPITOR) 40 MG tablet Take 40 mg by mouth daily. 01/07/15   [provider]  baclofen (LIORESAL) 10 MG tablet Take 10 mg by mouth 2 (two) times daily. 02/19/22   [provider]  busPIRone (BUSPAR) 5 MG tablet Take 5 mg by mouth at bedtime as needed (anxiety/sleep).    [provider]  cilostazol (PLETAL) 100 MG tablet Take 100 mg by mouth 2 (two) times daily. 09/03/21   [provider]  dexamethasone (DECADRON) 4 MG tablet Take 20 mg (5 tablets) by mouth weekly every Wednesday morning 01/27/22   Derek Jack, MD  ferrous sulfate 325 (65 FE) MG EC tablet Take 1 tablet (325 mg total) by mouth daily with breakfast. 04/08/20   Jacquelin Hawking, NP  lenalidomide (REVLIMID) 15 MG capsule Take 1 capsule (15 mg total) by mouth daily. Take for 21 days on, 7 days off. 02/17/22   Derek Jack, MD  lidocaine-prilocaine (EMLA) cream Apply 1 Application topically as needed. Patient not taking: Reported on 02/24/2022 11/05/21   Dede Query T, PA-C  potassium chloride SA (KLOR-CON M) 20 MEQ tablet Take 2 tablets (40 mEq total) by mouth daily. 01/06/22   Derek Jack, MD  prochlorperazine (COMPAZINE) 10 MG tablet Take by mouth. Patient not taking: Reported on 02/24/2022 11/25/21   [provider]  prochlorperazine (COMPAZINE) 5 MG tablet Take 1 tablet (5 mg total) by mouth every 6 (six) hours as needed for nausea or vomiting. Patient not taking: Reported on 02/24/2022 11/25/21   Derek Jack, MD  tiZANidine (ZANAFLEX) 2 MG tablet Take 2 mg by mouth 2 (two) times daily. 02/10/22   [provider]  traZODone (DESYREL) 50 MG tablet Take 50 mg by mouth at bedtime  as needed. 11/23/21   [provider]    Family History Family History  Problem Relation Age of Onset   Diabetes Mother     Social History Social History   Tobacco Use   Smoking status: Former   Smokeless tobacco: Never  Scientific laboratory technician Use: Never used  Substance Use Topics   Alcohol use: No   Drug use: No     Allergies   Penicillins   Review of Systems Review of Systems Per HPI  Physical Exam Triage Vital Signs ED Triage Vitals  Enc Vitals Group     BP 02/27/22 1542 (!) 184/72     Pulse Rate 02/27/22 1542 89     Resp 02/27/22 1542 18     Temp 02/27/22 1542 98.3 F (36.8 C)  Temp Source 02/27/22 1542 Oral     SpO2 02/27/22 1542 95 %     Weight --      Height --      Head Circumference --      Peak Flow --      Pain Score 02/27/22 1546 10     Pain Loc --      Pain Edu? --      Excl. in [place]? --    No data found.  Updated Vital Signs BP (!) 184/72 (BP Location: Right Arm)   Pulse 89   Temp 98.3 F (36.8 C) (Oral)   Resp 18   SpO2 95%   Visual Acuity Right Eye Distance:   Left Eye Distance:   Bilateral Distance:    Right Eye Near:   Left Eye Near:    Bilateral Near:     Physical Exam Vitals and nursing note reviewed.  Constitutional:      General: She is in acute distress.     Appearance: Normal appearance.  Eyes:     Extraocular Movements: Extraocular movements intact.     Pupils: Pupils are equal, round, and reactive to light.  Neck:     Comments: Tenderness noted to the rhomboid muscles bilaterally and around the anterior neck.. Cardiovascular:     Rate and Rhythm: Normal rate and regular rhythm.     Pulses: Normal pulses.     Heart sounds: Normal heart sounds.  Pulmonary:     Effort: Pulmonary effort is normal. No respiratory distress.     Breath sounds: Normal breath sounds. No stridor. No wheezing, rhonchi or rales.  Abdominal:     General: Bowel sounds are normal.     Palpations: Abdomen is soft.   Musculoskeletal:     Cervical back: Pain with movement and muscular tenderness present. Decreased range of motion.  Skin:    General: Skin is warm and dry.  Neurological:     General: No focal deficit present.     Mental Status: She is alert and oriented to person, place, and time.  Psychiatric:        Mood and Affect: Mood normal.        Behavior: Behavior normal.      UC Treatments / Results  Labs (all labs ordered are listed, but only abnormal results are displayed) Labs Reviewed - No data to display  EKG   Radiology No results found.  Procedures Procedures (including critical care time)  Medications Ordered in UC Medications - No data to display  Initial Impression / Assessment and Plan / UC Course  I have reviewed the triage vital signs and the nursing notes.  Pertinent labs & imaging results that were available during my care of the patient were reviewed by me and considered in my medical decision making (see chart for details).  The patient appears to be uncomfortable due to neck pain.  She is hypertensive, vital signs are otherwise stable.  Difficult to ascertain the cause of the patient's neck pain.  She currently is under the care of her PCP/oncologist and has a pending CT scan on 03/10/2022.  Patient was previously prescribed tramadol.  Reports that she dropped the medication.  In light of the recent prescription, will provide patient tramadol 50 mg 4 tablets until she can be follow up with her PCP or oncologist.  Supportive care recommendations were provided to the patient to continue using ice or heat, avoiding sleeping elevated on more than 1 pillow, along with  ER precautions.  Patient's family member was also advised of the same.  Patient verbalizes understanding.  All questions were answered.  Patient is stable for discharge.   Final Clinical Impressions(s) / UC Diagnoses   Final diagnoses:  Neck pain  History of cancer     Discharge Instructions       Take medication as prescribed. Recommend the use of ice or heat as needed for pain or discomfort. Recommend sleeping flat or avoiding sleeping elevated on more than 1 pillow while neck pain persist. As discussed, please follow-up with your oncologist on 03/01/2022 to inform of worsening neck pain or discomfort. If symptoms are not controlled with the medication prescribed today, please follow-up in the emergency department for further evaluation. If you develop numbness, tingling, severe pain, or other concerns, please go to the emergency department. Follow-up as needed.    ED Prescriptions     Medication Sig Dispense Auth. Provider   traMADol (ULTRAM) 50 MG tablet Take 1 tablet (50 mg total) by mouth every 12 (twelve) hours as needed. 4 tablet Edrie Ehrich-Warren, Alda Lea, NP      I have reviewed the PDMP during this encounter.   Tish Men, NP 02/27/22 1626

## 2022-02-27 NOTE — Discharge Instructions (Signed)
Take medication as prescribed. Recommend the use of ice or heat as needed for pain or discomfort. Recommend sleeping flat or avoiding sleeping elevated on more than 1 pillow while neck pain persist. As discussed, please follow-up with your oncologist on 03/01/2022 to inform of worsening neck pain or discomfort. If symptoms are not controlled with the medication prescribed today, please follow-up in the emergency department for further evaluation. If you develop numbness, tingling, severe pain, or other concerns, please go to the emergency department. Follow-up as needed.

## 2022-02-27 NOTE — ED Triage Notes (Signed)
Neck pain x 4 weeks. States pain started on right side and radiates to left side to shoulder Was seen by PCP and supposed to have a CT done on 1/17.  States she is in too much pain and can't wait until 1/17.  States she has tried ibuprofen and tylenol without relief.

## 2022-03-06 ENCOUNTER — Other Ambulatory Visit: Payer: Self-pay

## 2022-03-09 ENCOUNTER — Encounter: Payer: Self-pay | Admitting: Hematology

## 2022-03-10 ENCOUNTER — Inpatient Hospital Stay: Payer: Medicare Other

## 2022-03-10 VITALS — BP 161/78 | HR 78 | Temp 98.4°F | Resp 20 | Wt 144.4 lb

## 2022-03-10 DIAGNOSIS — Z87891 Personal history of nicotine dependence: Secondary | ICD-10-CM | POA: Diagnosis not present

## 2022-03-10 DIAGNOSIS — Z9221 Personal history of antineoplastic chemotherapy: Secondary | ICD-10-CM | POA: Diagnosis not present

## 2022-03-10 DIAGNOSIS — C9000 Multiple myeloma not having achieved remission: Secondary | ICD-10-CM

## 2022-03-10 DIAGNOSIS — Z8572 Personal history of non-Hodgkin lymphomas: Secondary | ICD-10-CM | POA: Diagnosis not present

## 2022-03-10 DIAGNOSIS — Z5111 Encounter for antineoplastic chemotherapy: Secondary | ICD-10-CM | POA: Diagnosis not present

## 2022-03-10 DIAGNOSIS — M542 Cervicalgia: Secondary | ICD-10-CM | POA: Diagnosis not present

## 2022-03-10 DIAGNOSIS — Z79899 Other long term (current) drug therapy: Secondary | ICD-10-CM | POA: Diagnosis not present

## 2022-03-10 DIAGNOSIS — E611 Iron deficiency: Secondary | ICD-10-CM | POA: Diagnosis not present

## 2022-03-10 DIAGNOSIS — D631 Anemia in chronic kidney disease: Secondary | ICD-10-CM | POA: Diagnosis not present

## 2022-03-10 DIAGNOSIS — D509 Iron deficiency anemia, unspecified: Secondary | ICD-10-CM

## 2022-03-10 DIAGNOSIS — N189 Chronic kidney disease, unspecified: Secondary | ICD-10-CM | POA: Diagnosis not present

## 2022-03-10 DIAGNOSIS — Z95828 Presence of other vascular implants and grafts: Secondary | ICD-10-CM

## 2022-03-10 DIAGNOSIS — Z9081 Acquired absence of spleen: Secondary | ICD-10-CM | POA: Diagnosis not present

## 2022-03-10 DIAGNOSIS — D473 Essential (hemorrhagic) thrombocythemia: Secondary | ICD-10-CM | POA: Diagnosis not present

## 2022-03-10 DIAGNOSIS — E876 Hypokalemia: Secondary | ICD-10-CM | POA: Diagnosis not present

## 2022-03-10 LAB — COMPREHENSIVE METABOLIC PANEL
ALT: 11 U/L (ref 0–44)
AST: 13 U/L — ABNORMAL LOW (ref 15–41)
Albumin: 2.9 g/dL — ABNORMAL LOW (ref 3.5–5.0)
Alkaline Phosphatase: 57 U/L (ref 38–126)
Anion gap: 11 (ref 5–15)
BUN: 8 mg/dL (ref 8–23)
CO2: 24 mmol/L (ref 22–32)
Calcium: 8.9 mg/dL (ref 8.9–10.3)
Chloride: 105 mmol/L (ref 98–111)
Creatinine, Ser: 0.94 mg/dL (ref 0.44–1.00)
GFR, Estimated: 58 mL/min — ABNORMAL LOW (ref >60)
Glucose, Bld: 93 mg/dL (ref 70–99)
Potassium: 3.9 mmol/L (ref 3.5–5.1)
Sodium: 140 mmol/L (ref 135–145)
Total Bilirubin: 0.7 mg/dL (ref 0.3–1.2)
Total Protein: 5.6 g/dL — ABNORMAL LOW (ref 6.5–8.1)

## 2022-03-10 LAB — CBC WITH DIFFERENTIAL/PLATELET
Abs Immature Granulocytes: 0.11 10*3/uL — ABNORMAL HIGH (ref 0.00–0.07)
Basophils Absolute: 0.1 10*3/uL (ref 0.0–0.1)
Basophils Relative: 0 %
Eosinophils Absolute: 0 10*3/uL (ref 0.0–0.5)
Eosinophils Relative: 0 %
HCT: 33.4 % — ABNORMAL LOW (ref 36.0–46.0)
Hemoglobin: 10.7 g/dL — ABNORMAL LOW (ref 12.0–15.0)
Immature Granulocytes: 1 %
Lymphocytes Relative: 21 %
Lymphs Abs: 2.6 10*3/uL (ref 0.7–4.0)
MCH: 28.8 pg (ref 26.0–34.0)
MCHC: 32 g/dL (ref 30.0–36.0)
MCV: 89.8 fL (ref 80.0–100.0)
Monocytes Absolute: 0.7 10*3/uL (ref 0.1–1.0)
Monocytes Relative: 6 %
Neutro Abs: 8.9 10*3/uL — ABNORMAL HIGH (ref 1.7–7.7)
Neutrophils Relative %: 72 %
Platelets: 522 10*3/uL — ABNORMAL HIGH (ref 150–400)
RBC: 3.72 MIL/uL — ABNORMAL LOW (ref 3.87–5.11)
RDW: 19.4 % — ABNORMAL HIGH (ref 11.5–15.5)
WBC: 12.3 10*3/uL — ABNORMAL HIGH (ref 4.0–10.5)
nRBC: 0 % (ref 0.0–0.2)

## 2022-03-10 LAB — MAGNESIUM: Magnesium: 1.8 mg/dL (ref 1.7–2.4)

## 2022-03-10 LAB — IRON AND TIBC
Iron: 20 ug/dL — ABNORMAL LOW (ref 28–170)
Saturation Ratios: 8 % — ABNORMAL LOW (ref 10.4–31.8)
TIBC: 249 ug/dL — ABNORMAL LOW (ref 250–450)
UIBC: 229 ug/dL

## 2022-03-10 LAB — FERRITIN: Ferritin: 93 ng/mL (ref 11–307)

## 2022-03-10 MED ORDER — LORATADINE 10 MG PO TABS
10.0000 mg | ORAL_TABLET | Freq: Once | ORAL | Status: AC
  Administered 2022-03-10: 10 mg via ORAL
  Filled 2022-03-10: qty 1

## 2022-03-10 MED ORDER — DARATUMUMAB-HYALURONIDASE-FIHJ 1800-30000 MG-UT/15ML ~~LOC~~ SOLN
1800.0000 mg | Freq: Once | SUBCUTANEOUS | Status: AC
  Administered 2022-03-10: 1800 mg via SUBCUTANEOUS
  Filled 2022-03-10: qty 15

## 2022-03-10 MED ORDER — SODIUM CHLORIDE 0.9% FLUSH
10.0000 mL | INTRAVENOUS | Status: DC | PRN
  Administered 2022-03-10: 10 mL via INTRAVENOUS

## 2022-03-10 MED ORDER — HEPARIN SOD (PORK) LOCK FLUSH 100 UNIT/ML IV SOLN
500.0000 [IU] | Freq: Once | INTRAVENOUS | Status: AC
  Administered 2022-03-10: 500 [IU] via INTRAVENOUS

## 2022-03-10 MED ORDER — ACETAMINOPHEN 325 MG PO TABS
650.0000 mg | ORAL_TABLET | Freq: Once | ORAL | Status: AC
  Administered 2022-03-10: 650 mg via ORAL
  Filled 2022-03-10: qty 2

## 2022-03-10 NOTE — Progress Notes (Signed)
Patient presents today for Daratumumab injection per providers order.  Vital signs and labs within parameters for treatment.  Patient has no new complaints at this time.  Patient took dexamethasone at home prior to visit.  Stable during administration without incident; injection site WNL; see MAR for injection details.  Patient tolerated procedure well and without incident.  No questions or complaints noted at this time.

## 2022-03-10 NOTE — Patient Instructions (Signed)
[place]  Discharge Instructions: Thank you for choosing [facility] to provide your oncology and hematology care.  If you have a lab appointment with the [facility], please come in thru the Main Entrance and check in at the main information desk.  Wear comfortable clothing and clothing appropriate for easy access to any Portacath or PICC line.   We strive to give you quality time with your provider. You may need to reschedule your appointment if you arrive late (15 or more minutes).  Arriving late affects you and other patients whose appointments are after yours.  Also, if you miss three or more appointments without notifying the office, you may be dismissed from the clinic at the provider's discretion.      For prescription refill requests, have your pharmacy contact our office and allow 72 hours for refills to be completed.    Today you received the following chemotherapy and/or immunotherapy agents daratumumab      To help prevent nausea and vomiting after your treatment, we encourage you to take your nausea medication as directed.  BELOW ARE SYMPTOMS THAT SHOULD BE REPORTED IMMEDIATELY: *FEVER GREATER THAN 100.4 F (38 C) OR HIGHER *CHILLS OR SWEATING *NAUSEA AND VOMITING THAT IS NOT CONTROLLED WITH YOUR NAUSEA MEDICATION *UNUSUAL SHORTNESS OF BREATH *UNUSUAL BRUISING OR BLEEDING *URINARY PROBLEMS (pain or burning when urinating, or frequent urination) *BOWEL PROBLEMS (unusual diarrhea, constipation, pain near the anus) TENDERNESS IN MOUTH AND THROAT WITH OR WITHOUT PRESENCE OF ULCERS (sore throat, sores in mouth, or a toothache) UNUSUAL RASH, SWELLING OR PAIN  UNUSUAL VAGINAL DISCHARGE OR ITCHING   Items with * indicate a potential emergency and should be followed up as soon as possible or go to the Emergency Department if any problems should occur.  Please show the CHEMOTHERAPY ALERT CARD or IMMUNOTHERAPY ALERT CARD at check-in to the  Emergency Department and triage nurse.  Should you have questions after your visit or need to cancel or reschedule your appointment, please contact [facility] [phone]  and follow the prompts.  Office hours are 8:00 a.m. to 4:30 p.m. Monday - Friday. Please note that voicemails left after 4:00 p.m. may not be returned until the following business day.  We are closed weekends and major holidays. You have access to a nurse at all times for urgent questions. Please call the main number to the clinic [phone] and follow the prompts.  For any non-urgent questions, you may also contact your provider using MyChart. We now offer e-Visits for anyone 58 and older to request care online for non-urgent symptoms. For details visit [email or web address].   Also download the MyChart app! Go to the app store, search "MyChart", open the app, select [place], and log in with your MyChart username and password.

## 2022-03-10 NOTE — Progress Notes (Signed)
Patients port flushed without difficulty.  Good blood return noted with no bruising or swelling noted at site.  Band aid applied.  VSS with discharge and left in satisfactory condition with no s/s of distress noted.   

## 2022-03-11 LAB — KAPPA/LAMBDA LIGHT CHAINS
Kappa free light chain: 41.1 mg/L — ABNORMAL HIGH (ref 3.3–19.4)
Kappa, lambda light chain ratio: 2.16 — ABNORMAL HIGH (ref 0.26–1.65)
Lambda free light chains: 19 mg/L (ref 5.7–26.3)

## 2022-03-12 LAB — PROTEIN ELECTROPHORESIS, SERUM
A/G Ratio: 1.3 (ref 0.7–1.7)
Albumin ELP: 2.9 g/dL (ref 2.9–4.4)
Alpha-1-Globulin: 0.3 g/dL (ref 0.0–0.4)
Alpha-2-Globulin: 0.7 g/dL (ref 0.4–1.0)
Beta Globulin: 0.8 g/dL (ref 0.7–1.3)
Gamma Globulin: 0.5 g/dL (ref 0.4–1.8)
Globulin, Total: 2.3 g/dL (ref 2.2–3.9)
M-Spike, %: 0.2 g/dL — ABNORMAL HIGH
Total Protein ELP: 5.2 g/dL — ABNORMAL LOW (ref 6.0–8.5)

## 2022-03-15 ENCOUNTER — Other Ambulatory Visit: Payer: Self-pay

## 2022-03-15 MED ORDER — LENALIDOMIDE 15 MG PO CAPS: 15.0000 mg | ORAL_CAPSULE | Freq: Every day | ORAL | 0 refills | Status: DC

## 2022-03-15 NOTE — Telephone Encounter (Signed)
Chart reviewed. Revlimid refilled per last office note with Dr. Katragadda.  

## 2022-03-16 ENCOUNTER — Encounter (HOSPITAL_COMMUNITY): Payer: Self-pay | Admitting: *Deleted

## 2022-03-16 ENCOUNTER — Other Ambulatory Visit (HOSPITAL_COMMUNITY): Payer: Self-pay | Admitting: *Deleted

## 2022-03-16 DIAGNOSIS — C9000 Multiple myeloma not having achieved remission: Secondary | ICD-10-CM

## 2022-03-16 DIAGNOSIS — M542 Cervicalgia: Secondary | ICD-10-CM

## 2022-03-16 NOTE — Progress Notes (Signed)
Approval received for patient to have Cervical Spine MRI W/WO contrast at [facility] in [place].  Approval and order faxed to [phone].

## 2022-03-17 ENCOUNTER — Ambulatory Visit (HOSPITAL_COMMUNITY)
Admission: RE | Admit: 2022-03-17 | Discharge: 2022-03-17 | Disposition: A | Discharge status: Home / Self Care | Payer: Medicare Other | Source: Ambulatory Visit | Attending: Hematology | Admitting: Hematology

## 2022-03-17 DIAGNOSIS — C9000 Multiple myeloma not having achieved remission: Secondary | ICD-10-CM | POA: Insufficient documentation

## 2022-03-17 DIAGNOSIS — M542 Cervicalgia: Secondary | ICD-10-CM | POA: Diagnosis not present

## 2022-03-17 DIAGNOSIS — M4312 Spondylolisthesis, cervical region: Secondary | ICD-10-CM | POA: Diagnosis not present

## 2022-03-17 MED ORDER — GADOBUTROL 1 MMOL/ML IV SOLN
7.0000 mL | Freq: Once | INTRAVENOUS | Status: AC | PRN
  Administered 2022-03-17: 7 mL via INTRAVENOUS

## 2022-03-19 ENCOUNTER — Other Ambulatory Visit: Payer: Self-pay

## 2022-03-22 DIAGNOSIS — C884 Extranodal marginal zone B-cell lymphoma of mucosa-associated lymphoid tissue [MALT-lymphoma]: Secondary | ICD-10-CM | POA: Diagnosis not present

## 2022-03-22 DIAGNOSIS — I10 Essential (primary) hypertension: Secondary | ICD-10-CM | POA: Diagnosis not present

## 2022-03-24 ENCOUNTER — Inpatient Hospital Stay: Payer: Medicare Other

## 2022-03-24 ENCOUNTER — Inpatient Hospital Stay: Payer: Medicare Other | Admitting: Hematology

## 2022-03-24 DIAGNOSIS — Z9081 Acquired absence of spleen: Secondary | ICD-10-CM | POA: Diagnosis not present

## 2022-03-24 DIAGNOSIS — Z9221 Personal history of antineoplastic chemotherapy: Secondary | ICD-10-CM | POA: Diagnosis not present

## 2022-03-24 DIAGNOSIS — C9000 Multiple myeloma not having achieved remission: Secondary | ICD-10-CM | POA: Diagnosis not present

## 2022-03-24 DIAGNOSIS — Z79899 Other long term (current) drug therapy: Secondary | ICD-10-CM | POA: Diagnosis not present

## 2022-03-24 DIAGNOSIS — Z87891 Personal history of nicotine dependence: Secondary | ICD-10-CM | POA: Diagnosis not present

## 2022-03-24 DIAGNOSIS — M542 Cervicalgia: Secondary | ICD-10-CM

## 2022-03-24 DIAGNOSIS — N189 Chronic kidney disease, unspecified: Secondary | ICD-10-CM | POA: Diagnosis not present

## 2022-03-24 DIAGNOSIS — D631 Anemia in chronic kidney disease: Secondary | ICD-10-CM | POA: Diagnosis not present

## 2022-03-24 DIAGNOSIS — D473 Essential (hemorrhagic) thrombocythemia: Secondary | ICD-10-CM | POA: Diagnosis not present

## 2022-03-24 DIAGNOSIS — E876 Hypokalemia: Secondary | ICD-10-CM | POA: Diagnosis not present

## 2022-03-24 DIAGNOSIS — E611 Iron deficiency: Secondary | ICD-10-CM | POA: Diagnosis not present

## 2022-03-24 DIAGNOSIS — Z5111 Encounter for antineoplastic chemotherapy: Secondary | ICD-10-CM | POA: Diagnosis not present

## 2022-03-24 DIAGNOSIS — Z8572 Personal history of non-Hodgkin lymphomas: Secondary | ICD-10-CM | POA: Diagnosis not present

## 2022-03-24 LAB — CBC WITH DIFFERENTIAL/PLATELET
Abs Immature Granulocytes: 0.03 10*3/uL (ref 0.00–0.07)
Basophils Absolute: 0 10*3/uL (ref 0.0–0.1)
Basophils Relative: 0 %
Eosinophils Absolute: 0.1 10*3/uL (ref 0.0–0.5)
Eosinophils Relative: 3 %
HCT: 35.4 % — ABNORMAL LOW (ref 36.0–46.0)
Hemoglobin: 11.4 g/dL — ABNORMAL LOW (ref 12.0–15.0)
Immature Granulocytes: 1 %
Lymphocytes Relative: 49 %
Lymphs Abs: 2.2 10*3/uL (ref 0.7–4.0)
MCH: 28.5 pg (ref 26.0–34.0)
MCHC: 32.2 g/dL (ref 30.0–36.0)
MCV: 88.5 fL (ref 80.0–100.0)
Monocytes Absolute: 1 10*3/uL (ref 0.1–1.0)
Monocytes Relative: 23 %
Neutro Abs: 1.1 10*3/uL — ABNORMAL LOW (ref 1.7–7.7)
Neutrophils Relative %: 24 %
Platelets: 280 10*3/uL (ref 150–400)
RBC: 4 MIL/uL (ref 3.87–5.11)
RDW: 20.8 % — ABNORMAL HIGH (ref 11.5–15.5)
WBC: 4.5 10*3/uL (ref 4.0–10.5)
nRBC: 0 % (ref 0.0–0.2)

## 2022-03-24 MED ORDER — DEXAMETHASONE 4 MG PO TABS: ORAL_TABLET | ORAL | 6 refills | Status: DC

## 2022-03-24 MED ORDER — LORATADINE 10 MG PO TABS: 10.0000 mg | ORAL_TABLET | Freq: Once | ORAL | Status: DC

## 2022-03-24 MED ORDER — KETOROLAC TROMETHAMINE 60 MG/2ML IM SOLN
60.0000 mg | Freq: Once | INTRAMUSCULAR | Status: AC
  Administered 2022-03-24: 60 mg via INTRAMUSCULAR
  Filled 2022-03-24: qty 2

## 2022-03-24 MED ORDER — KETOROLAC TROMETHAMINE 10 MG PO TABS: 10.0000 mg | ORAL_TABLET | Freq: Four times a day (QID) | ORAL | 0 refills | Status: DC | PRN

## 2022-03-24 MED ORDER — CETIRIZINE HCL 10 MG PO TABS
10.0000 mg | ORAL_TABLET | Freq: Once | ORAL | Status: AC
  Administered 2022-03-24: 10 mg via ORAL
  Filled 2022-03-24: qty 1

## 2022-03-24 MED ORDER — HEPARIN SOD (PORK) LOCK FLUSH 100 UNIT/ML IV SOLN
500.0000 [IU] | Freq: Once | INTRAVENOUS | Status: AC
  Administered 2022-03-24: 500 [IU] via INTRAVENOUS

## 2022-03-24 MED ORDER — ACETAMINOPHEN 325 MG PO TABS
650.0000 mg | ORAL_TABLET | Freq: Once | ORAL | Status: AC
  Administered 2022-03-24: 650 mg via ORAL
  Filled 2022-03-24: qty 2

## 2022-03-24 MED ORDER — DARATUMUMAB-HYALURONIDASE-FIHJ 1800-30000 MG-UT/15ML ~~LOC~~ SOLN
1800.0000 mg | Freq: Once | SUBCUTANEOUS | Status: AC
  Administered 2022-03-24: 1800 mg via SUBCUTANEOUS
  Filled 2022-03-24: qty 15

## 2022-03-24 MED ORDER — SODIUM CHLORIDE 0.9% FLUSH
10.0000 mL | INTRAVENOUS | Status: DC | PRN
  Administered 2022-03-24: 10 mL via INTRAVENOUS

## 2022-03-24 NOTE — Patient Instructions (Addendum)
[place] at [facility] Discharge Instructions   You were seen and examined today by Dr. Delton Coombes.  He reviewed the results of your lab work which are normal/stable. Your myeloma numbers are coming down. Your m-spike 2 weeks ago was 0.2.   We will make a referral to a neurosurgeon to see if they can help with your neck pain. We will also ordered a CT scan of your neck without contrast to make sure there is not a suspicious lesion on the bones.   We will proceed with your injection today.   Return as scheduled.    Thank you for choosing [place] at [facility] to provide your oncology and hematology care.  To afford each patient quality time with our provider, please arrive at least 15 minutes before your scheduled appointment time.   If you have a lab appointment with the [facility] please come in thru the Main Entrance and check in at the main information desk.  You need to re-schedule your appointment should you arrive 10 or more minutes late.  We strive to give you quality time with our providers, and arriving late affects you and other patients whose appointments are after yours.  Also, if you no show three or more times for appointments you may be dismissed from the clinic at the providers discretion.     Again, thank you for choosing [facility].  Our hope is that these requests will decrease the amount of time that you wait before being seen by our physicians.       _____________________________________________________________  Should you have questions after your visit to [facility], please contact our office at [phone] and follow the prompts.  Our office hours are 8:00 a.m. and 4:30 p.m. Monday - Friday.  Please note that voicemails left after 4:00 p.m. may not be returned until the following business day.  We are closed weekends and major holidays.  You do have access to a nurse 24-7, just call  the main number to the clinic [phone] and do not press any options, hold on the line and a nurse will answer the phone.    For prescription refill requests, have your pharmacy contact our office and allow 72 hours.    Due to Covid, you will need to wear a mask upon entering the hospital. If you do not have a mask, a mask will be given to you at the Main Entrance upon arrival. For doctor visits, patients may have 1 support person age 41 or older with them. For treatment visits, patients can not have anyone with them due to social distancing guidelines and our immunocompromised population.

## 2022-03-24 NOTE — Progress Notes (Signed)
Discontinue diphenhydramine from oncology treatment plan --> Add cetirizine 10 mg oral  x 1 as premedication for oncology treatment plan.  Patient reports taking oral dexamethasone at home.  T.O. Dr Rhys Martini, PharmD

## 2022-03-24 NOTE — Patient Instructions (Signed)
[place]  Discharge Instructions: Thank you for choosing [facility] to provide your oncology and hematology care.  If you have a lab appointment with the [facility], please come in thru the Main Entrance and check in at the main information desk.  Wear comfortable clothing and clothing appropriate for easy access to any Portacath or PICC line.   We strive to give you quality time with your provider. You may need to reschedule your appointment if you arrive late (15 or more minutes).  Arriving late affects you and other patients whose appointments are after yours.  Also, if you miss three or more appointments without notifying the office, you may be dismissed from the clinic at the provider's discretion.      For prescription refill requests, have your pharmacy contact our office and allow 72 hours for refills to be completed.    Today you received the following chemotherapy and/or immunotherapy agents daratumumab SQ    To help prevent nausea and vomiting after your treatment, we encourage you to take your nausea medication as directed.  BELOW ARE SYMPTOMS THAT SHOULD BE REPORTED IMMEDIATELY: *FEVER GREATER THAN 100.4 F (38 C) OR HIGHER *CHILLS OR SWEATING *NAUSEA AND VOMITING THAT IS NOT CONTROLLED WITH YOUR NAUSEA MEDICATION *UNUSUAL SHORTNESS OF BREATH *UNUSUAL BRUISING OR BLEEDING *URINARY PROBLEMS (pain or burning when urinating, or frequent urination) *BOWEL PROBLEMS (unusual diarrhea, constipation, pain near the anus) TENDERNESS IN MOUTH AND THROAT WITH OR WITHOUT PRESENCE OF ULCERS (sore throat, sores in mouth, or a toothache) UNUSUAL RASH, SWELLING OR PAIN  UNUSUAL VAGINAL DISCHARGE OR ITCHING   Items with * indicate a potential emergency and should be followed up as soon as possible or go to the Emergency Department if any problems should occur.  Please show the CHEMOTHERAPY ALERT CARD or IMMUNOTHERAPY ALERT CARD at check-in to the  Emergency Department and triage nurse.  Should you have questions after your visit or need to cancel or reschedule your appointment, please contact [facility] [phone]  and follow the prompts.  Office hours are 8:00 a.m. to 4:30 p.m. Monday - Friday. Please note that voicemails left after 4:00 p.m. may not be returned until the following business day.  We are closed weekends and major holidays. You have access to a nurse at all times for urgent questions. Please call the main number to the clinic [phone] and follow the prompts.  For any non-urgent questions, you may also contact your provider using MyChart. We now offer e-Visits for anyone 73 and older to request care online for non-urgent symptoms. For details visit [email or web address].   Also download the MyChart app! Go to the app store, search "MyChart", open the app, select [place], and log in with your MyChart username and password.

## 2022-03-24 NOTE — Progress Notes (Signed)
Patient is taking Revlimid as prescribed.  She has not missed any doses and reports no side effects at this time.    Patient has been examined by Dr. Katragadda, and vital signs and labs have been reviewed. ANC, Creatinine, LFTs, hemoglobin, and platelets are within treatment parameters per M.D. - pt may proceed with treatment.  Primary RN and pharmacy notified.  

## 2022-03-24 NOTE — Progress Notes (Signed)
Leslie Williamson, [place] [address]   CLINIC:  Medical Oncology/Hematology  Patient Care Team: Carrolyn Meiers, MD as PCP - General (Internal Medicine) Derek Jack, MD as Medical Oncologist (Medical Oncology)   REASON FOR VISIT:  Follow-up for newly diagnosed multiple myeloma  PRIOR THERAPY: None  NGS Results: Not applicable  CURRENT THERAPY: Daratumumab, Revlimid and dexamethasone  BRIEF ONCOLOGIC HISTORY:  Oncology History  NHL (non-Hodgkin's lymphoma) ([place])  07/19/1995 Pathology Results   Spleen biopsy with resection- low-grade B-cell lymphoma, splenic marginal zone type   02/20/1998 Imaging   CT CAP-marked and extensive cervical adenopathy   02/25/1998 Pathology Results   Right cervical lymph node demonstrating large B-cell lymphoma   03/05/1998 Bone Marrow Biopsy   No evidence of bone marrow involvement   03/11/1998 - 07/18/1998 Chemotherapy   CHOP x 7 cycles   05/09/1998 Imaging   CT CAP and neck- slight interval decrease in size of para-aortic adenopathy with interval decrease in right inguinal and bi-iliac adenopathy.  Interval decrease in size and number of enlarged cervical nodes   07/10/1998 Imaging   Ct CAP and neck- unremarkable CT of chest. No enlarged retroperitoneal adenopathy withthe previously noted retroperitoneal nodes currently smaller to stable in size.  Cervical adenopathy is stable to slightly smaller in size.   08/18/1998 - 09/08/1998 Chemotherapy   Rituxan Day 1, 8, 15, 22 x 1 cycle   01/08/1999 Remission   CT CAP and neck demonstrates no adenopathy or disease   01/08/1999 Imaging   CT CAP and neck- Stable CT of chest. Stable CT abd/pelvis without adenopathy.  Negative CT of neck   Multiple myeloma ([place])  11/16/2021 Initial Diagnosis   Multiple myeloma ([place])   11/30/2021 -  Chemotherapy   Patient is on Treatment Plan : MYELOMA  Daratumumab SQ + Lenalidomide + Dexamethasone (DaraRd) q28d        CANCER STAGING:  Cancer Staging  Multiple myeloma ([place]) Staging form: Plasma Cell Myeloma and Plasma Cell Disorders, AJCC 8th Edition - Clinical stage from 11/16/2021: Albumin (g/dL): 3.1, ISS: Stage II, High-risk cytogenetics: Absent, LDH: Normal - Unsigned   INTERVAL HISTORY:  [name] 89 y.o. female seen for follow-up and toxicity assessment prior to cycle 5 of multiple myeloma. She was last seen by me on 02/24/22. She is accompanied by her son today.  She was seen at urgent care on 02/27/22 for persistent neck pain x3 weeks which had worsened. Her pain began radiating into her shoulders with secondary discomfort when turning her head. Her Tramadol '50mg'$  Rx was refilled by urgent care provider.  Today, she states that she continues to have right-sided posterior neck pain since it began Christmas day 2023. Her pain has not improved since onset and often wakes her up at night. Her pain is exacerbated by turning her neck. We discussed her MRI c-spine results from 03/17/22 results consistent with arthritis. We also reviewed her reassuring NM PET scan results from 11/05/2021. She has not tried any steroids for her pain.  Her appetite level is at 100%. Her energy level is at 50%. She is compliant with Revlimid and finishes this tomorrow before taking 1 week off.  She saw her dentist who recommended a tooth extraction. She was advised to follow up with her oral surgeon for this.   REVIEW OF SYSTEMS:  Review of Systems  Constitutional:  Positive for fatigue. Negative for chills and fever.  HENT:   Negative for lump/mass, mouth sores, nosebleeds, sore  throat and trouble swallowing.   Eyes:  Negative for eye problems.  Respiratory:  Negative for cough.   Cardiovascular:  Negative for chest pain, leg swelling and palpitations.  Gastrointestinal:  Negative for abdominal pain, constipation, diarrhea, nausea and vomiting.  Genitourinary:  Negative for bladder incontinence, difficulty urinating,  dysuria, frequency, hematuria and nocturia.   Musculoskeletal:  Positive for neck pain. Negative for arthralgias, back pain, flank pain and myalgias.  Skin:  Negative for itching and rash.  Neurological:  Positive for dizziness. Negative for headaches and numbness.  Hematological:  Does not bruise/bleed easily.  Psychiatric/Behavioral:  Negative for depression, sleep disturbance and suicidal ideas. The patient is not nervous/anxious.   All other systems reviewed and are negative.    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   Vertigo, labyrinthine    Past Surgical History:  Procedure Laterality Date   CATARACT EXTRACTION W/PHACO Left 11/03/2015   Procedure: CATARACT EXTRACTION PHACO AND INTRAOCULAR LENS PLACEMENT ([place]);  Surgeon: Tonny Branch, MD;  Location: [facility];  Service: Ophthalmology;  Laterality: Left;  CDE: 7.74   CATARACT EXTRACTION W/PHACO Right 11/20/2015   Procedure: CATARACT EXTRACTION PHACO AND INTRAOCULAR LENS PLACEMENT ; CDE:  8.30;  Surgeon: Tonny Branch, MD;  Location: [facility];  Service: Ophthalmology;  Laterality: Right;   IR IMAGING GUIDED PORT INSERTION  11/04/2021   PORT-A-CATH REMOVAL       SOCIAL HISTORY:  Social History   Socioeconomic History   Marital status: Widowed    Spouse name: Not on file   Number of children: Not on file   Years of education: Not on file   Highest education level: Not on file  Occupational History   Not on file   Tobacco Use   Smoking status: Former   Smokeless tobacco: Never  Vaping Use   Vaping Use: Never used  Substance and Sexual Activity   Alcohol use: No   Drug use: No   Sexual activity: Not Currently  Other Topics Concern   Not on file  Social History Narrative   Not on file   Social Determinants of Health   Financial Resource Strain: Low Risk  (12/31/2019)   Overall Financial Resource Strain (CARDIA)    Difficulty of Paying Living Expenses: Not hard at all  Food Insecurity: No Food Insecurity (12/31/2019)   Hunger Vital Sign    Worried About Running Out of Food in the Last Year: Never true    [place] in the Last Year: Never true  Transportation Needs: No Transportation Needs (12/31/2019)   PRAPARE - Hydrologist (Medical): No    Lack of Transportation (Non-Medical): No  Physical Activity: Inactive (12/31/2019)   Exercise Vital Sign    Days of Exercise per Week: 0 days    Minutes of Exercise per Session: 0 min  Stress: No Stress Concern Present (12/31/2019)  [employer] - Occupational Stress Questionnaire    Feeling of Stress : Not at all  Social Connections: Moderately Isolated (12/31/2019)   Social Connection and Isolation Panel [NHANES]    Frequency of Communication with Friends and Family: More than three times a week    Frequency of Social Gatherings with Friends and Family: More than three times a week    Attends Religious Services: More than 4 times per year    Active Member of [employer] or Organizations: No    Attends Archivist Meetings: Never    Marital Status: Widowed  Intimate Partner Violence: Not At Risk (12/31/2019)   Humiliation, Afraid, Rape, and Kick questionnaire    Fear of Current or Ex-Partner: No    Emotionally Abused: No    Physically Abused: No    Sexually Abused: No    FAMILY HISTORY:  Family History  Problem Relation Age of Onset   Diabetes Mother     CURRENT MEDICATIONS:    Current Outpatient Medications:    acyclovir (ZOVIRAX) 400 MG tablet, Take 1 tablet (400 mg total) by mouth 2 (two) times daily., Disp: 60 tablet, Rfl: 6   amLODipine (NORVASC) 5 MG tablet, Take 1 tablet (5 mg total) by mouth daily., Disp: 30 tablet, Rfl: 2   aspirin 81 MG tablet, Take 81 mg by mouth daily., Disp: , Rfl:    atorvastatin (LIPITOR) 40 MG tablet, Take 40 mg by mouth daily., Disp: , Rfl: 2   baclofen (LIORESAL) 10 MG tablet, Take 10 mg by mouth 2 (two) times daily., Disp: , Rfl:    busPIRone (BUSPAR) 5 MG tablet, Take 5 mg by mouth at bedtime as needed (anxiety/sleep)., Disp: , Rfl:    cilostazol (PLETAL) 100 MG tablet, Take 100 mg by mouth 2 (two) times daily., Disp: , Rfl:    dexamethasone (DECADRON) 4 MG tablet, Take 20 mg (5 tablets) by mouth weekly every Wednesday morning, Disp: 20 tablet, Rfl: 6   ferrous sulfate 325 (65 FE) MG EC tablet, Take 1 tablet (325 mg total) by mouth daily with breakfast., Disp: 90 tablet, Rfl: 3   lenalidomide (REVLIMID) 15 MG capsule, Take 1 capsule (15 mg total) by mouth daily. Take for 21 days on, 7 days off., Disp: 21 capsule, Rfl: 0   lidocaine-prilocaine (EMLA) cream, Apply 1 Application topically as needed., Disp: 30 g, Rfl: 1   potassium chloride SA (KLOR-CON M) 20 MEQ tablet, Take 2 tablets (40 mEq total) by mouth daily., Disp: 60 tablet, Rfl: 4   prochlorperazine (COMPAZINE) 10 MG tablet, Take by mouth., Disp: , Rfl:    prochlorperazine (COMPAZINE) 5 MG tablet, Take 1 tablet (5 mg total) by mouth every 6 (six) hours as needed for nausea or vomiting., Disp: 30 tablet, Rfl: 6   tiZANidine (ZANAFLEX) 2 MG tablet, Take 2 mg by mouth 2 (two) times daily., Disp: , Rfl:    traMADol (ULTRAM) 50 MG tablet, Take 1 tablet (50 mg total) by mouth every 12 (twelve) hours as needed., Disp: 4 tablet, Rfl: 0   traZODone (DESYREL) 50 MG tablet, Take 50 mg by mouth at bedtime as needed., Disp: , Rfl:     ALLERGIES:  Allergies  Allergen Reactions    Penicillins Rash     PHYSICAL EXAM:  ECOG Performance status: 1  There were no vitals filed for this visit.  There were no vitals filed for this visit.  Physical Exam Vitals reviewed. Exam conducted with a chaperone present.  Constitutional:  Appearance: Normal appearance.  Cardiovascular:     Rate and Rhythm: Normal rate and regular rhythm.     Pulses: Normal pulses.     Heart sounds: Normal heart sounds.  Pulmonary:     Effort: Pulmonary effort is normal.     Breath sounds: Normal breath sounds.  Abdominal:     Palpations: Abdomen is soft. There is no hepatomegaly, splenomegaly or mass.     Tenderness: There is no abdominal tenderness.  Lymphadenopathy:     Upper Body:     Right upper body: No supraclavicular, axillary or pectoral adenopathy.     Left upper body: No supraclavicular, axillary or pectoral adenopathy.  Neurological:     General: No focal deficit present.     Mental Status: She is alert and oriented to person, place, and time.  Psychiatric:        Mood and Affect: Mood normal.        Behavior: Behavior normal.      LABORATORY DATA:  I have reviewed the labs as listed.  CBC    Component Value Date/Time   WBC 4.5 03/24/2022 0956   RBC 4.00 03/24/2022 0956   HGB 11.4 (L) 03/24/2022 0956   HCT 35.4 (L) 03/24/2022 0956   PLT 280 03/24/2022 0956   MCV 88.5 03/24/2022 0956   MCH 28.5 03/24/2022 0956   MCHC 32.2 03/24/2022 0956   RDW 20.8 (H) 03/24/2022 0956   LYMPHSABS 2.2 03/24/2022 0956   MONOABS 1.0 03/24/2022 0956   EOSABS 0.1 03/24/2022 0956   BASOSABS 0.0 03/24/2022 0956      Latest Ref Rng & Units 03/10/2022   12:21 PM 02/10/2022   11:36 AM 01/27/2022    9:37 AM  CMP  Glucose 70 - 99 mg/dL 93  118  127   BUN 8 - 23 mg/dL '8  9  11   '$ Creatinine 0.44 - 1.00 mg/dL 0.94  1.00  1.05   Sodium 135 - 145 mmol/L 140  139  137   Potassium 3.5 - 5.1 mmol/L 3.9  3.6  3.6   Chloride 98 - 111 mmol/L 105  110  110   CO2 22 - 32 mmol/L '24  22  21    '$ Calcium 8.9 - 10.3 mg/dL 8.9  8.8  8.0   Total Protein 6.5 - 8.1 g/dL 5.6  6.2  5.3   Total Bilirubin 0.3 - 1.2 mg/dL 0.7  0.6  0.6   Alkaline Phos 38 - 126 U/L 57  68  62   AST 15 - 41 U/L '13  13  14   '$ ALT 0 - 44 U/L '11  12  13     '$ DIAGNOSTIC IMAGING:  I have independently reviewed the scans and discussed with the patient. MR Cervical Spine W Wo Contrast  Result Date: 03/18/2022 CLINICAL DATA:  89 year old female with multiple myeloma. Severe neck pain radiating to the shoulder for 2 months. EXAM: MRI CERVICAL SPINE WITHOUT AND WITH CONTRAST TECHNIQUE: Multiplanar and multiecho pulse sequences of the cervical spine, to include the craniocervical junction and cervicothoracic junction, were obtained without and with intravenous contrast. CONTRAST:  21m GADAVIST GADOBUTROL 1 MMOL/ML IV SOLN COMPARISON:  PET-CT 11/05/2021. FINDINGS: Alignment: Straightening of cervical lordosis overall but subtle anterolisthesis of C4 on C5. Vertebrae: Some heterogeneous background bone marrow signal, although to a large degree this has a degenerative appearance. The left C4-C5 facet is abnormal (series 11, image 15), heterogeneous, sclerotic, with enhancing material in and around the facet. And  sagittal stir images demonstrate regional soft tissue edema, and also abnormal signal in the nearby C4-C5 interspinous ligament (series 7, image 8). However, no other suspicious osseous lesion is identified from the skull base to the visible upper thoracic spine (through T4. Incidental T3-T4 interbody ankylosis appears to be degenerative. Cord: Aside from mild cervical spine degenerative mass effect the visible spinal cord appears to be normal. No abnormal intradural enhancement or dural thickening identified. Posterior Fossa, vertebral arteries, paraspinal tissues: Cervicomedullary junction is within normal limits. Negative visible posterior fossa. Preserved major vascular flow voids in the neck. Tortuous left vertebral artery.  Left posterior neck soft tissue findings at the C4 level as above. Other visible neck soft tissues, and lung apices appear negative. Disc levels: Widespread cervical spine degeneration, most notably C3-C4: Circumferential disc bulging, endplate spurring and moderate facet and ligament flavum hypertrophy greater on the right. No significant spinal stenosis. Moderate to severe bilateral C4 foraminal stenosis. C4-C5: Subtle anterolisthesis. Mild disc bulging and endplate spurring. Moderate to severe facet hypertrophy which was present on the PET-CT last year. Regional abnormal enhancement and STIR signal as detailed above. No spinal stenosis. Moderate left C5 foraminal stenosis. C5-C6: Severe disc space loss with circumferential disc osteophyte complex. Effaced ventral CSF space and up to mild spinal stenosis, moderate to severe bilateral C6 foraminal stenosis. IMPRESSION: 1. Abnormal left C4-C5 facet, with regional edema and enhancement. But rather than plasmacytoma I favor this is degenerative acute on chronic facet joint arthritis, with chronic facet hypertrophy there and underlying mild spondylolisthesis. No other suspicious marrow lesion identified in the cervical spine. If the clinical diagnosis is in doubt, then follow-up noncontrast Cervical Spine CT could be used to confirm there is no suspicious lytic lesion at C4-C5. 2. Other widespread cervical spine degeneration. But only mild degenerative spinal stenosis (C5-C6). Moderate to severe degenerative neural foraminal stenosis at the bilateral C4, left C5, bilateral C6 nerve levels. Electronically Signed   By: Genevie Ann M.D.   On: 03/18/2022 08:15      ASSESSMENT:  1.  Biclonal IgG lambda and IgM kappa multiple myeloma, high risk: - BMBX (11/02/2021): IgG lambda plasma cells 70% - Myeloma FISH panel: Monosomy 13 (standard GIST), duplication of 1 q. (high risk), gain of 11 q. (standard risk). - Cytogenetics: Hyperdiploid E weight gain (trisomy/trisomy) of  odd-numbered chromosomes and monosomy 13, standard risk.  Gain of 1 q. associated with progressive course. - PET scan (11/05/2021): No evidence of multiple myeloma.  No hypermetabolic adenopathy. - Daratumumab, Revlimid and dexamethasone cycle 1 started on 11/30/2021  2.  JAK2 positive essential thrombocytosis: - BMBX (11/02/2021): No evidence of fibrosis.  No morphological evidence of JAK2 positive ET.  3.  History of marginal zone lymphoma of the spleen: - Diagnosed in 1999, status post CHOP x7 cycles followed by rituximab 1 cycle. - Status post splenectomy.  No evidence of lymphoma on current PET scan.  4.  Social/family history: - She lives with her sister.  Son lives 30 minutes away.  She is independent of ADLs and some IADLs.  She drives.  She worked at [employer] and denies any exposure to chemicals or pesticides.  No family history of malignancies.    PLAN:  1.  IgG lambda multiple myeloma: - We reviewed myeloma panel from 03/10/2022 which showed M spike improved to 0.2 g from 0.3 g previously.  Free light chain ratio improved to 2.16 from 2.49.  Kappa light chains are 41, down from 53. - She will  continue Darzalex every 2 weeks.  She will start Revlimid next cycle on 04/01/2022.  RTC 4 weeks with repeat labs.  2.  Anemia from CKD and iron deficiency: - Last Venofer on 11/13/2021.  Hemoglobin is 11.4.  Ferritin is 93 and percent saturation 8.  Will closely monitor.  3.  ID prophylaxis: - Continue acyclovir twice daily and aspirin 81 mg daily.  4.  Bone protection (DEXA scan 02/17/2022 T-score -0.9): - We talked about starting zoledronic acid/denosumab.  We will start her after dental work done.  5.  Hypokalemia: - Continue potassium 40 mEq daily.  Last potassium was 3.9.  6.  Right-sided neck pain since 02/15/2022: - MRI C-spine (03/17/2022) reviewed by me: Acute on chronic facet joint arthritis at left C4-C5 facet with chronic facet hypertrophy.  No suspicious marrow  lesion.  Widespread cervical spine degeneration. - Will order CT of the neck without contrast to evaluate for suspicious lytic lesion at C4-5. - We have given Toradol 60 mg in the office today which helped.  We will send Toradol 10 mg every 6 hours as needed at home.  I have also given Medrol Dosepak. - Will also make a referral to neurosurgery.      Orders placed this encounter:  Orders Placed This Encounter  Procedures   CT SOFT TISSUE NECK WO CONTRAST    I,Alexis Herring,acting as a scribe for Derek Jack, MD.,have documented all relevant documentation on the behalf of Derek Jack, MD,as directed by  Derek Jack, MD while in the presence of Derek Jack, MD.  I, Derek Jack MD, have reviewed the above documentation for accuracy and completeness, and I agree with the above.     Derek Jack, MD [facility] [phone]

## 2022-03-24 NOTE — Progress Notes (Signed)
Labs reviewed with MD today. Ok to treat per MD. Patient took her decadron at home.   Treatment given per orders. Patient tolerated it well without problems. Vitals stable and discharged home from clinic via wheelchair. Follow up as scheduled.

## 2022-03-24 NOTE — Addendum Note (Signed)
Addended by: Donnie Aho on: 03/24/2022 01:07 PM   Modules accepted: Orders

## 2022-03-25 ENCOUNTER — Encounter: Payer: Self-pay | Admitting: Hematology

## 2022-03-28 ENCOUNTER — Other Ambulatory Visit: Payer: Self-pay

## 2022-04-05 NOTE — Progress Notes (Signed)
Pharmacy has substituted cetirizine 10 mg orally x 1 as premedication for   Loratidine discontinued.  V.O. Dr Rhys Martini, PharmD

## 2022-04-07 ENCOUNTER — Inpatient Hospital Stay: Payer: Medicare Other

## 2022-04-07 ENCOUNTER — Inpatient Hospital Stay: Payer: Medicare Other | Attending: Hematology

## 2022-04-07 ENCOUNTER — Ambulatory Visit: Payer: Medicare Other | Admitting: Hematology

## 2022-04-07 DIAGNOSIS — I739 Peripheral vascular disease, unspecified: Secondary | ICD-10-CM | POA: Insufficient documentation

## 2022-04-07 DIAGNOSIS — M542 Cervicalgia: Secondary | ICD-10-CM | POA: Diagnosis not present

## 2022-04-07 DIAGNOSIS — E876 Hypokalemia: Secondary | ICD-10-CM | POA: Diagnosis not present

## 2022-04-07 DIAGNOSIS — I10 Essential (primary) hypertension: Secondary | ICD-10-CM | POA: Diagnosis not present

## 2022-04-07 DIAGNOSIS — D631 Anemia in chronic kidney disease: Secondary | ICD-10-CM | POA: Insufficient documentation

## 2022-04-07 DIAGNOSIS — Z79899 Other long term (current) drug therapy: Secondary | ICD-10-CM | POA: Diagnosis not present

## 2022-04-07 DIAGNOSIS — Z7961 Long term (current) use of immunomodulator: Secondary | ICD-10-CM | POA: Diagnosis not present

## 2022-04-07 DIAGNOSIS — N189 Chronic kidney disease, unspecified: Secondary | ICD-10-CM | POA: Diagnosis not present

## 2022-04-07 DIAGNOSIS — Z5112 Encounter for antineoplastic immunotherapy: Secondary | ICD-10-CM | POA: Insufficient documentation

## 2022-04-07 DIAGNOSIS — C9000 Multiple myeloma not having achieved remission: Secondary | ICD-10-CM | POA: Diagnosis not present

## 2022-04-07 DIAGNOSIS — Z7982 Long term (current) use of aspirin: Secondary | ICD-10-CM | POA: Diagnosis not present

## 2022-04-07 DIAGNOSIS — D509 Iron deficiency anemia, unspecified: Secondary | ICD-10-CM | POA: Insufficient documentation

## 2022-04-07 DIAGNOSIS — Z79624 Long term (current) use of inhibitors of nucleotide synthesis: Secondary | ICD-10-CM | POA: Diagnosis not present

## 2022-04-07 DIAGNOSIS — E7800 Pure hypercholesterolemia, unspecified: Secondary | ICD-10-CM | POA: Insufficient documentation

## 2022-04-07 DIAGNOSIS — I129 Hypertensive chronic kidney disease with stage 1 through stage 4 chronic kidney disease, or unspecified chronic kidney disease: Secondary | ICD-10-CM | POA: Insufficient documentation

## 2022-04-07 DIAGNOSIS — Z7902 Long term (current) use of antithrombotics/antiplatelets: Secondary | ICD-10-CM | POA: Diagnosis not present

## 2022-04-07 DIAGNOSIS — Z8572 Personal history of non-Hodgkin lymphomas: Secondary | ICD-10-CM | POA: Diagnosis not present

## 2022-04-07 LAB — CBC WITH DIFFERENTIAL/PLATELET
Abs Immature Granulocytes: 0.03 10*3/uL (ref 0.00–0.07)
Basophils Absolute: 0.1 10*3/uL (ref 0.0–0.1)
Basophils Relative: 1 %
Eosinophils Absolute: 0.1 10*3/uL (ref 0.0–0.5)
Eosinophils Relative: 1 %
HCT: 33.2 % — ABNORMAL LOW (ref 36.0–46.0)
Hemoglobin: 10.5 g/dL — ABNORMAL LOW (ref 12.0–15.0)
Immature Granulocytes: 1 %
Lymphocytes Relative: 32 %
Lymphs Abs: 2 10*3/uL (ref 0.7–4.0)
MCH: 28.3 pg (ref 26.0–34.0)
MCHC: 31.6 g/dL (ref 30.0–36.0)
MCV: 89.5 fL (ref 80.0–100.0)
Monocytes Absolute: 0.5 10*3/uL (ref 0.1–1.0)
Monocytes Relative: 9 %
Neutro Abs: 3.6 10*3/uL (ref 1.7–7.7)
Neutrophils Relative %: 56 %
Platelets: 482 10*3/uL — ABNORMAL HIGH (ref 150–400)
RBC: 3.71 MIL/uL — ABNORMAL LOW (ref 3.87–5.11)
RDW: 22 % — ABNORMAL HIGH (ref 11.5–15.5)
WBC: 6.3 10*3/uL (ref 4.0–10.5)
nRBC: 0 % (ref 0.0–0.2)

## 2022-04-07 LAB — COMPREHENSIVE METABOLIC PANEL
ALT: 8 U/L (ref 0–44)
AST: 11 U/L — ABNORMAL LOW (ref 15–41)
Albumin: 3.2 g/dL — ABNORMAL LOW (ref 3.5–5.0)
Alkaline Phosphatase: 55 U/L (ref 38–126)
Anion gap: 7 (ref 5–15)
BUN: 10 mg/dL (ref 8–23)
CO2: 25 mmol/L (ref 22–32)
Calcium: 8.7 mg/dL — ABNORMAL LOW (ref 8.9–10.3)
Chloride: 108 mmol/L (ref 98–111)
Creatinine, Ser: 0.92 mg/dL (ref 0.44–1.00)
GFR, Estimated: 60 mL/min — ABNORMAL LOW (ref >60)
Glucose, Bld: 102 mg/dL — ABNORMAL HIGH (ref 70–99)
Potassium: 3.4 mmol/L — ABNORMAL LOW (ref 3.5–5.1)
Sodium: 140 mmol/L (ref 135–145)
Total Bilirubin: 0.7 mg/dL (ref 0.3–1.2)
Total Protein: 5.6 g/dL — ABNORMAL LOW (ref 6.5–8.1)

## 2022-04-07 LAB — MAGNESIUM: Magnesium: 1.9 mg/dL (ref 1.7–2.4)

## 2022-04-07 MED ORDER — CETIRIZINE HCL 10 MG PO TABS
10.0000 mg | ORAL_TABLET | Freq: Once | ORAL | Status: AC
  Administered 2022-04-07: 10 mg via ORAL
  Filled 2022-04-07: qty 1

## 2022-04-07 MED ORDER — DARATUMUMAB-HYALURONIDASE-FIHJ 1800-30000 MG-UT/15ML ~~LOC~~ SOLN
1800.0000 mg | Freq: Once | SUBCUTANEOUS | Status: AC
  Administered 2022-04-07: 1800 mg via SUBCUTANEOUS
  Filled 2022-04-07: qty 15

## 2022-04-07 MED ORDER — SODIUM CHLORIDE 0.9% FLUSH
10.0000 mL | INTRAVENOUS | Status: AC
  Administered 2022-04-07: 10 mL

## 2022-04-07 MED ORDER — SODIUM CHLORIDE 0.9% FLUSH
10.0000 mL | Freq: Once | INTRAVENOUS | Status: AC
  Administered 2022-04-07: 10 mL via INTRAVENOUS

## 2022-04-07 MED ORDER — ACETAMINOPHEN 325 MG PO TABS
650.0000 mg | ORAL_TABLET | Freq: Once | ORAL | Status: AC
  Administered 2022-04-07: 650 mg via ORAL
  Filled 2022-04-07: qty 2

## 2022-04-07 MED ORDER — POTASSIUM CHLORIDE CRYS ER 20 MEQ PO TBCR
40.0000 meq | EXTENDED_RELEASE_TABLET | Freq: Once | ORAL | Status: AC
  Administered 2022-04-07: 40 meq via ORAL
  Filled 2022-04-07: qty 2

## 2022-04-07 MED ORDER — HEPARIN SOD (PORK) LOCK FLUSH 100 UNIT/ML IV SOLN
500.0000 [IU] | Freq: Once | INTRAVENOUS | Status: AC
  Administered 2022-04-07: 500 [IU] via INTRAVENOUS

## 2022-04-07 NOTE — Patient Instructions (Signed)
[facility]  Discharge Instructions: Thank you for choosing [facility] to provide your oncology and hematology care.  If you have a lab appointment with the [facility], please come in thru the Main Entrance and check in at the main information desk.  Wear comfortable clothing and clothing appropriate for easy access to any Portacath or PICC line.   We strive to give you quality time with your provider. You may need to reschedule your appointment if you arrive late (15 or more minutes).  Arriving late affects you and other patients whose appointments are after yours.  Also, if you miss three or more appointments without notifying the office, you may be dismissed from the clinic at the provider's discretion.      For prescription refill requests, have your pharmacy contact our office and allow 72 hours for refills to be completed.    Today you received the following chemotherapy and/or immunotherapy agents Daratumumab      To help prevent nausea and vomiting after your treatment, we encourage you to take your nausea medication as directed.  BELOW ARE SYMPTOMS THAT SHOULD BE REPORTED IMMEDIATELY: *FEVER GREATER THAN 100.4 F (38 C) OR HIGHER *CHILLS OR SWEATING *NAUSEA AND VOMITING THAT IS NOT CONTROLLED WITH YOUR NAUSEA MEDICATION *UNUSUAL SHORTNESS OF BREATH *UNUSUAL BRUISING OR BLEEDING *URINARY PROBLEMS (pain or burning when urinating, or frequent urination) *BOWEL PROBLEMS (unusual diarrhea, constipation, pain near the anus) TENDERNESS IN MOUTH AND THROAT WITH OR WITHOUT PRESENCE OF ULCERS (sore throat, sores in mouth, or a toothache) UNUSUAL RASH, SWELLING OR PAIN  UNUSUAL VAGINAL DISCHARGE OR ITCHING   Items with * indicate a potential emergency and should be followed up as soon as possible or go to the Emergency Department if any problems should occur.  Please show the CHEMOTHERAPY ALERT CARD or IMMUNOTHERAPY ALERT CARD at check-in to the  Emergency Department and triage nurse.  Should you have questions after your visit or need to cancel or reschedule your appointment, please contact [facility] [phone]  and follow the prompts.  Office hours are 8:00 a.m. to 4:30 p.m. Monday - Friday. Please note that voicemails left after 4:00 p.m. may not be returned until the following business day.  We are closed weekends and major holidays. You have access to a nurse at all times for urgent questions. Please call the main number to the clinic [phone] and follow the prompts.  For any non-urgent questions, you may also contact your provider using MyChart. We now offer e-Visits for anyone 37 and older to request care online for non-urgent symptoms. For details visit [email or web address].   Also download the MyChart app! Go to the app store, search "MyChart", open the app, select [facility], and log in with your MyChart username and password.

## 2022-04-07 NOTE — Progress Notes (Signed)
Patient presents today for Daratumumab injection.  Patient is in satisfactory condition with no new complaints voiced. Vital signs are stable.  Labs reviewed and all labs are within treatment parameters.  Potassium today is 3.4.  We will give Klor Con 40 mEq PO x one dose today per standing orders by Dr. Delton Coombes.  Patient states that she took Decadron 20 mg prior to visit so we will hold Decadron and give Tylenol and Zyrtec.  We will proceed with injection per MD orders.

## 2022-04-07 NOTE — Progress Notes (Signed)
Stable during administration without incident; injection site WNL; see MAR for injection details.  Patient tolerated procedure well and without incident.  No questions or complaints noted at this time.

## 2022-04-08 LAB — KAPPA/LAMBDA LIGHT CHAINS
Kappa free light chain: 35.6 mg/L — ABNORMAL HIGH (ref 3.3–19.4)
Kappa, lambda light chain ratio: 2.13 — ABNORMAL HIGH (ref 0.26–1.65)
Lambda free light chains: 16.7 mg/L (ref 5.7–26.3)

## 2022-04-09 LAB — PROTEIN ELECTROPHORESIS, SERUM
A/G Ratio: 1.5 (ref 0.7–1.7)
Albumin ELP: 3.1 g/dL (ref 2.9–4.4)
Alpha-1-Globulin: 0.3 g/dL (ref 0.0–0.4)
Alpha-2-Globulin: 0.5 g/dL (ref 0.4–1.0)
Beta Globulin: 0.8 g/dL (ref 0.7–1.3)
Gamma Globulin: 0.5 g/dL (ref 0.4–1.8)
Globulin, Total: 2.1 g/dL — ABNORMAL LOW (ref 2.2–3.9)
M-Spike, %: 0.2 g/dL — ABNORMAL HIGH
Total Protein ELP: 5.2 g/dL — ABNORMAL LOW (ref 6.0–8.5)

## 2022-04-13 ENCOUNTER — Other Ambulatory Visit: Payer: Self-pay

## 2022-04-13 MED ORDER — LENALIDOMIDE 15 MG PO CAPS: 15.0000 mg | ORAL_CAPSULE | Freq: Every day | ORAL | 0 refills | Status: DC

## 2022-04-13 NOTE — Telephone Encounter (Signed)
Chart reviewed. Revlimid refilled per last office note with Dr. Katragadda.  

## 2022-04-14 ENCOUNTER — Ambulatory Visit (HOSPITAL_COMMUNITY): Admission: RE | Admit: 2022-04-14 | Payer: Medicare Other | Source: Ambulatory Visit

## 2022-04-16 ENCOUNTER — Other Ambulatory Visit: Payer: Self-pay

## 2022-04-18 ENCOUNTER — Other Ambulatory Visit: Payer: Self-pay

## 2022-04-19 NOTE — Progress Notes (Signed)
The patient has been taking dexamethasone at home prior to daratumumab injections. After discussion with the provider, the offset time of daratumumab was changed to 30 minutes, starting 04/21/2022, in order to reduce patient wait time. A calendar was not sent to the patient because she has already been taking dexamethasone at home.  Laray Anger, PharmD PGY-2 Pharmacy Resident Hematology/Oncology [phone]  04/19/2022 5:00 PM

## 2022-04-21 ENCOUNTER — Inpatient Hospital Stay (HOSPITAL_BASED_OUTPATIENT_CLINIC_OR_DEPARTMENT_OTHER): Payer: Medicare Other | Admitting: Hematology

## 2022-04-21 ENCOUNTER — Inpatient Hospital Stay: Payer: Medicare Other

## 2022-04-21 ENCOUNTER — Ambulatory Visit: Payer: Medicare Other

## 2022-04-21 ENCOUNTER — Inpatient Hospital Stay: Payer: Medicare Other | Admitting: Hematology

## 2022-04-21 ENCOUNTER — Ambulatory Visit: Payer: Self-pay

## 2022-04-21 ENCOUNTER — Other Ambulatory Visit: Payer: Medicare Other

## 2022-04-21 DIAGNOSIS — E876 Hypokalemia: Secondary | ICD-10-CM | POA: Diagnosis not present

## 2022-04-21 DIAGNOSIS — Z7961 Long term (current) use of immunomodulator: Secondary | ICD-10-CM | POA: Diagnosis not present

## 2022-04-21 DIAGNOSIS — C9000 Multiple myeloma not having achieved remission: Secondary | ICD-10-CM

## 2022-04-21 DIAGNOSIS — D631 Anemia in chronic kidney disease: Secondary | ICD-10-CM | POA: Diagnosis not present

## 2022-04-21 DIAGNOSIS — Z5112 Encounter for antineoplastic immunotherapy: Secondary | ICD-10-CM | POA: Diagnosis not present

## 2022-04-21 DIAGNOSIS — D509 Iron deficiency anemia, unspecified: Secondary | ICD-10-CM | POA: Diagnosis not present

## 2022-04-21 DIAGNOSIS — Z79624 Long term (current) use of inhibitors of nucleotide synthesis: Secondary | ICD-10-CM | POA: Diagnosis not present

## 2022-04-21 DIAGNOSIS — E7800 Pure hypercholesterolemia, unspecified: Secondary | ICD-10-CM | POA: Diagnosis not present

## 2022-04-21 DIAGNOSIS — Z7982 Long term (current) use of aspirin: Secondary | ICD-10-CM | POA: Diagnosis not present

## 2022-04-21 DIAGNOSIS — Z79899 Other long term (current) drug therapy: Secondary | ICD-10-CM | POA: Diagnosis not present

## 2022-04-21 DIAGNOSIS — M542 Cervicalgia: Secondary | ICD-10-CM | POA: Diagnosis not present

## 2022-04-21 DIAGNOSIS — I129 Hypertensive chronic kidney disease with stage 1 through stage 4 chronic kidney disease, or unspecified chronic kidney disease: Secondary | ICD-10-CM | POA: Diagnosis not present

## 2022-04-21 DIAGNOSIS — Z7902 Long term (current) use of antithrombotics/antiplatelets: Secondary | ICD-10-CM | POA: Diagnosis not present

## 2022-04-21 DIAGNOSIS — Z8572 Personal history of non-Hodgkin lymphomas: Secondary | ICD-10-CM | POA: Diagnosis not present

## 2022-04-21 DIAGNOSIS — I739 Peripheral vascular disease, unspecified: Secondary | ICD-10-CM | POA: Diagnosis not present

## 2022-04-21 DIAGNOSIS — I10 Essential (primary) hypertension: Secondary | ICD-10-CM | POA: Diagnosis not present

## 2022-04-21 DIAGNOSIS — N189 Chronic kidney disease, unspecified: Secondary | ICD-10-CM | POA: Diagnosis not present

## 2022-04-21 LAB — COMPREHENSIVE METABOLIC PANEL
ALT: 9 U/L (ref 0–44)
AST: 13 U/L — ABNORMAL LOW (ref 15–41)
Albumin: 3.2 g/dL — ABNORMAL LOW (ref 3.5–5.0)
Alkaline Phosphatase: 59 U/L (ref 38–126)
Anion gap: 5 (ref 5–15)
BUN: 12 mg/dL (ref 8–23)
CO2: 23 mmol/L (ref 22–32)
Calcium: 8.3 mg/dL — ABNORMAL LOW (ref 8.9–10.3)
Chloride: 106 mmol/L (ref 98–111)
Creatinine, Ser: 0.91 mg/dL (ref 0.44–1.00)
GFR, Estimated: >60 mL/min (ref >60)
Glucose, Bld: 94 mg/dL (ref 70–99)
Potassium: 3.4 mmol/L — ABNORMAL LOW (ref 3.5–5.1)
Sodium: 134 mmol/L — ABNORMAL LOW (ref 135–145)
Total Bilirubin: 0.7 mg/dL (ref 0.3–1.2)
Total Protein: 5.6 g/dL — ABNORMAL LOW (ref 6.5–8.1)

## 2022-04-21 LAB — CBC WITH DIFFERENTIAL/PLATELET
Abs Immature Granulocytes: 0 10*3/uL (ref 0.00–0.07)
Basophils Absolute: 0 10*3/uL (ref 0.0–0.1)
Basophils Relative: 0 %
Eosinophils Absolute: 0.2 10*3/uL (ref 0.0–0.5)
Eosinophils Relative: 5 %
HCT: 32.4 % — ABNORMAL LOW (ref 36.0–46.0)
Hemoglobin: 10.4 g/dL — ABNORMAL LOW (ref 12.0–15.0)
Lymphocytes Relative: 49 %
Lymphs Abs: 2.3 10*3/uL (ref 0.7–4.0)
MCH: 28.7 pg (ref 26.0–34.0)
MCHC: 32.1 g/dL (ref 30.0–36.0)
MCV: 89.5 fL (ref 80.0–100.0)
Monocytes Absolute: 0.8 10*3/uL (ref 0.1–1.0)
Monocytes Relative: 17 %
Neutro Abs: 1.4 10*3/uL — ABNORMAL LOW (ref 1.7–7.7)
Neutrophils Relative %: 29 %
Platelets: 338 10*3/uL (ref 150–400)
RBC: 3.62 MIL/uL — ABNORMAL LOW (ref 3.87–5.11)
RDW: 22.1 % — ABNORMAL HIGH (ref 11.5–15.5)
WBC Morphology: REACTIVE
WBC: 4.7 10*3/uL (ref 4.0–10.5)
nRBC: 0 % (ref 0.0–0.2)

## 2022-04-21 MED ORDER — SODIUM CHLORIDE 0.9% FLUSH
10.0000 mL | INTRAVENOUS | Status: DC | PRN
  Administered 2022-04-21: 10 mL via INTRAVENOUS

## 2022-04-21 MED ORDER — SODIUM CHLORIDE 0.9% FLUSH
10.0000 mL | INTRAVENOUS | Status: AC
  Administered 2022-04-21: 10 mL via INTRAVENOUS

## 2022-04-21 MED ORDER — DARATUMUMAB-HYALURONIDASE-FIHJ 1800-30000 MG-UT/15ML ~~LOC~~ SOLN
1800.0000 mg | Freq: Once | SUBCUTANEOUS | Status: AC
  Administered 2022-04-21: 1800 mg via SUBCUTANEOUS
  Filled 2022-04-21: qty 15

## 2022-04-21 MED ORDER — CETIRIZINE HCL 10 MG PO TABS
10.0000 mg | ORAL_TABLET | Freq: Once | ORAL | Status: AC
  Administered 2022-04-21: 10 mg via ORAL
  Filled 2022-04-21: qty 1

## 2022-04-21 MED ORDER — ACETAMINOPHEN 325 MG PO TABS
650.0000 mg | ORAL_TABLET | Freq: Once | ORAL | Status: AC
  Administered 2022-04-21: 650 mg via ORAL
  Filled 2022-04-21: qty 2

## 2022-04-21 MED ORDER — HEPARIN SOD (PORK) LOCK FLUSH 100 UNIT/ML IV SOLN
500.0000 [IU] | Freq: Once | INTRAVENOUS | Status: AC
  Administered 2022-04-21: 500 [IU] via INTRAVENOUS

## 2022-04-21 NOTE — Progress Notes (Signed)
Leslie Williamson, Leslie Williamson   CLINIC:  Medical Oncology/Hematology  Patient Care Team: Carrolyn Meiers, MD as PCP - General (Internal Medicine) Derek Jack, MD as Medical Oncologist (Medical Oncology)   REASON FOR VISIT:  Follow-up for newly diagnosed multiple myeloma  PRIOR THERAPY: None  NGS Results: Not applicable  CURRENT THERAPY: Daratumumab, Revlimid and dexamethasone  BRIEF ONCOLOGIC HISTORY:  Oncology History  NHL (non-Hodgkin's lymphoma) ([place])  07/19/1995 Pathology Results   Spleen biopsy with resection- low-grade B-cell lymphoma, splenic marginal zone type   02/20/1998 Imaging   CT CAP-marked and extensive cervical adenopathy   02/25/1998 Pathology Results   Right cervical lymph node demonstrating large B-cell lymphoma   03/05/1998 Bone Marrow Biopsy   No evidence of bone marrow involvement   03/11/1998 - 07/18/1998 Chemotherapy   CHOP x 7 cycles   05/09/1998 Imaging   CT CAP and neck- slight interval decrease in size of para-aortic adenopathy with interval decrease in right inguinal and bi-iliac adenopathy.  Interval decrease in size and number of enlarged cervical nodes   07/10/1998 Imaging   Ct CAP and neck- unremarkable CT of chest. No enlarged retroperitoneal adenopathy withthe previously noted retroperitoneal nodes currently smaller to stable in size.  Cervical adenopathy is stable to slightly smaller in size.   08/18/1998 - 09/08/1998 Chemotherapy   Rituxan Day 1, 8, 15, 22 x 1 cycle   01/08/1999 Remission   CT CAP and neck demonstrates no adenopathy or disease   01/08/1999 Imaging   CT CAP and neck- Stable CT of chest. Stable CT abd/pelvis without adenopathy.  Negative CT of neck   Multiple myeloma ([facility])  11/16/2021 Initial Diagnosis   Multiple myeloma ([place])   11/30/2021 -  Chemotherapy   Patient is on Treatment Plan : MYELOMA  Daratumumab SQ + Lenalidomide + Dexamethasone (DaraRd) q28d        CANCER STAGING:  Cancer Staging  Multiple myeloma ([place]) Staging form: Plasma Cell Myeloma and Plasma Cell Disorders, AJCC 8th Edition - Clinical stage from 11/16/2021: Albumin (g/dL): 3.1, ISS: Stage II, High-risk cytogenetics: Absent, LDH: Normal - Unsigned   INTERVAL HISTORY:  Leslie Williamson 89 y.o. female seen for follow-up and toxicity assessment prior to cycle 6 of treatment for her multiple myeloma.  She reports improvement in her neck pain with Toradol injection and steroids at last visit.  She is no longer requiring Toradol tablets at home.  Neck pain completely resolved.  Denies any new onset pains.  She has seen dentist who has recommended dental extractions.    REVIEW OF SYSTEMS:  Review of Systems  Constitutional:  Positive for fatigue. Negative for chills and fever.  HENT:   Negative for lump/mass, mouth sores, nosebleeds, sore throat and trouble swallowing.   Eyes:  Negative for eye problems.  Respiratory:  Negative for cough.   Cardiovascular:  Negative for chest pain, leg swelling and palpitations.  Gastrointestinal:  Negative for abdominal pain, constipation, diarrhea, nausea and vomiting.  Genitourinary:  Negative for bladder incontinence, difficulty urinating, dysuria, frequency, hematuria and nocturia.   Musculoskeletal:  Positive for neck pain. Negative for arthralgias, back pain, flank pain and myalgias.  Skin:  Negative for itching and rash.  Neurological:  Positive for dizziness. Negative for headaches and numbness.  Hematological:  Does not bruise/bleed easily.  Psychiatric/Behavioral:  Negative for depression, sleep disturbance and suicidal ideas. The patient is not nervous/anxious.   All other systems reviewed and are negative.    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)    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   Vertigo, labyrinthine    Past Surgical History:  Procedure Laterality Date   CATARACT EXTRACTION W/PHACO Left 11/03/2015   Procedure: CATARACT EXTRACTION PHACO AND INTRAOCULAR LENS PLACEMENT ([place]);  Surgeon: Tonny Branch, MD;  Location: [facility];  Service: Ophthalmology;  Laterality: Left;  CDE: 7.74   CATARACT EXTRACTION W/PHACO Right 11/20/2015   Procedure: CATARACT EXTRACTION PHACO AND INTRAOCULAR LENS PLACEMENT ; CDE:  8.30;  Surgeon: Tonny Branch, MD;  Location: [facility];  Service: Ophthalmology;  Laterality: Right;   IR IMAGING GUIDED PORT INSERTION  11/04/2021   PORT-A-CATH REMOVAL       SOCIAL HISTORY:  Social History   Socioeconomic History   Marital status: Widowed    Spouse name: Not on file   Number of children: Not on file   Years of education: Not on file   Highest education level: Not on file  Occupational History   Not on file  Tobacco Use   Smoking status: Former   Smokeless tobacco: Never  Vaping Use   Vaping Use: Never used  Substance and Sexual Activity   Alcohol use: No   Drug use: No   Sexual activity: Not Currently  Other Topics Concern   Not on file  Social History Narrative   Not on file   Social Determinants of Health   Financial Resource Strain: Low Risk  (12/31/2019)   Overall Financial Resource Strain (CARDIA)    Difficulty of Paying Living Expenses: Not hard at all  Food Insecurity: No Food Insecurity (12/31/2019)   Hunger Vital Sign    Worried About Running Out of Food in the Last Year: Never true    [place] in the Last Year: Never true  Transportation Needs: No Transportation Needs (12/31/2019)    PRAPARE - Hydrologist (Medical): No    Lack of Transportation (Non-Medical): No  Physical Activity: Inactive (12/31/2019)   Exercise Vital Sign    Days of Exercise per Week: 0 days    Minutes of Exercise per Session: 0 min  Stress: No Stress Concern Present (12/31/2019)   [place]    Feeling of Stress : Not at all  Social Connections: Moderately Isolated (12/31/2019)   Social Connection and Isolation Panel [NHANES]    Frequency of Communication with Friends and Family: More than three times a week    Frequency of Social Gatherings with Friends and Family: More than three times a week    Attends Religious Services: More than 4 times per year    Active Member of [employer] or Organizations: No    Attends Archivist Meetings: Never    Marital Status: Widowed  Intimate Partner Violence: Not At Risk (12/31/2019)   Humiliation, Afraid, Rape, and Kick questionnaire    Fear of Current or  Ex-Partner: No    Emotionally Abused: No    Physically Abused: No    Sexually Abused: No    FAMILY HISTORY:  Family History  Problem Relation Age of Onset   Diabetes Mother     CURRENT MEDICATIONS:   Current Outpatient Medications:    acyclovir (ZOVIRAX) 400 MG tablet, Take 1 tablet (400 mg total) by mouth 2 (two) times daily., Disp: 60 tablet, Rfl: 6   amLODipine (NORVASC) 5 MG tablet, Take 1 tablet (5 mg total) by mouth daily., Disp: 30 tablet, Rfl: 2   aspirin 81 MG tablet, Take 81 mg by mouth daily., Disp: , Rfl:    atorvastatin (LIPITOR) 40 MG tablet, Take 40 mg by mouth daily., Disp: , Rfl: 2   baclofen (LIORESAL) 10 MG tablet, Take 10 mg by mouth 2 (two) times daily., Disp: , Rfl:    busPIRone (BUSPAR) 5 MG tablet, Take 5 mg by mouth at bedtime as needed (anxiety/sleep)., Disp: , Rfl:    cilostazol (PLETAL) 100 MG tablet, Take 100 mg by mouth 2 (two) times daily., Disp: , Rfl:    dexamethasone  (DECADRON) 4 MG tablet, Take 20 mg (5 tablets) by mouth weekly every Wednesday morning, Disp: 20 tablet, Rfl: 6   ferrous sulfate 325 (65 FE) MG EC tablet, Take 1 tablet (325 mg total) by mouth daily with breakfast., Disp: 90 tablet, Rfl: 3   ketorolac (TORADOL) 10 MG tablet, Take 1 tablet (10 mg total) by mouth every 6 (six) hours as needed., Disp: 30 tablet, Rfl: 0   lenalidomide (REVLIMID) 15 MG capsule, Take 1 capsule (15 mg total) by mouth daily. Take for 21 days on, 7 days off., Disp: 21 capsule, Rfl: 0   lidocaine-prilocaine (EMLA) cream, Apply 1 Application topically as needed., Disp: 30 g, Rfl: 1   potassium chloride SA (KLOR-CON M) 20 MEQ tablet, Take 2 tablets (40 mEq total) by mouth daily., Disp: 60 tablet, Rfl: 4   prochlorperazine (COMPAZINE) 10 MG tablet, Take by mouth., Disp: , Rfl:    prochlorperazine (COMPAZINE) 5 MG tablet, Take 1 tablet (5 mg total) by mouth every 6 (six) hours as needed for nausea or vomiting., Disp: 30 tablet, Rfl: 6   tiZANidine (ZANAFLEX) 2 MG tablet, Take 2 mg by mouth 2 (two) times daily., Disp: , Rfl:    traMADol (ULTRAM) 50 MG tablet, Take 1 tablet (50 mg total) by mouth every 12 (twelve) hours as needed., Disp: 4 tablet, Rfl: 0   traZODone (DESYREL) 50 MG tablet, Take 50 mg by mouth at bedtime as needed., Disp: , Rfl:  No current facility-administered medications for this visit.  Facility-Administered Medications Ordered in Other Visits:    sodium chloride flush (NS) 0.9 % injection 10 mL, 10 mL, Intravenous, PRN, Derek Jack, MD, 10 mL at 04/21/22 1121    ALLERGIES:  Allergies  Allergen Reactions   Penicillins Rash     PHYSICAL EXAM:  ECOG Performance status: 1  There were no vitals filed for this visit.  There were no vitals filed for this visit.  Physical Exam Vitals reviewed. Exam conducted with a chaperone present.  Constitutional:      Appearance: Normal appearance.  Cardiovascular:     Rate and Rhythm: Normal rate and  regular rhythm.     Pulses: Normal pulses.     Heart sounds: Normal heart sounds.  Pulmonary:     Effort: Pulmonary effort is normal.     Breath sounds: Normal breath sounds.  Abdominal:  Palpations: Abdomen is soft. There is no hepatomegaly, splenomegaly or mass.     Tenderness: There is no abdominal tenderness.  Lymphadenopathy:     Upper Body:     Right upper body: No supraclavicular, axillary or pectoral adenopathy.     Left upper body: No supraclavicular, axillary or pectoral adenopathy.  Neurological:     General: No focal deficit present.     Mental Status: She is alert and oriented to person, place, and time.  Psychiatric:        Mood and Affect: Mood normal.        Behavior: Behavior normal.      LABORATORY DATA:  I have reviewed the labs as listed.  CBC    Component Value Date/Time   WBC 6.3 04/07/2022 0929   RBC 3.71 (L) 04/07/2022 0929   HGB 10.5 (L) 04/07/2022 0929   HCT 33.2 (L) 04/07/2022 0929   PLT 482 (H) 04/07/2022 0929   MCV 89.5 04/07/2022 0929   MCH 28.3 04/07/2022 0929   MCHC 31.6 04/07/2022 0929   RDW 22.0 (H) 04/07/2022 0929   LYMPHSABS 2.0 04/07/2022 0929   MONOABS 0.5 04/07/2022 0929   EOSABS 0.1 04/07/2022 0929   BASOSABS 0.1 04/07/2022 0929      Latest Ref Rng & Units 04/07/2022    9:29 AM 03/10/2022   12:21 PM 02/10/2022   11:36 AM  CMP  Glucose 70 - 99 mg/dL 102  93  118   BUN 8 - 23 mg/dL '10  8  9   '$ Creatinine 0.44 - 1.00 mg/dL 0.92  0.94  1.00   Sodium 135 - 145 mmol/L 140  140  139   Potassium 3.5 - 5.1 mmol/L 3.4  3.9  3.6   Chloride 98 - 111 mmol/L 108  105  110   CO2 22 - 32 mmol/L '25  24  22   '$ Calcium 8.9 - 10.3 mg/dL 8.7  8.9  8.8   Total Protein 6.5 - 8.1 g/dL 5.6  5.6  6.2   Total Bilirubin 0.3 - 1.2 mg/dL 0.7  0.7  0.6   Alkaline Phos 38 - 126 U/L 55  57  68   AST 15 - 41 U/L '11  13  13   '$ ALT 0 - 44 U/L '8  11  12     '$ DIAGNOSTIC IMAGING:  I have independently reviewed the scans and discussed with the patient. No  results found.    ASSESSMENT:  1.  Biclonal IgG lambda and IgM kappa multiple myeloma, high risk: - BMBX (11/02/2021): IgG lambda plasma cells 70% - Myeloma FISH panel: Monosomy 13 (standard GIST), duplication of 1 q. (high risk), gain of 11 q. (standard risk). - Cytogenetics: Hyperdiploid E weight gain (trisomy/trisomy) of odd-numbered chromosomes and monosomy 13, standard risk.  Gain of 1 q. associated with progressive course. - PET scan (11/05/2021): No evidence of multiple myeloma.  No hypermetabolic adenopathy. - Daratumumab, Revlimid and dexamethasone cycle 1 started on 11/30/2021  2.  JAK2 positive essential thrombocytosis: - BMBX (11/02/2021): No evidence of fibrosis.  No morphological evidence of JAK2 positive ET.  3.  History of marginal zone lymphoma of the spleen: - Diagnosed in 1999, status post CHOP x7 cycles followed by rituximab 1 cycle. - Status post splenectomy.  No evidence of lymphoma on current PET scan.  4.  Social/family history: - She lives with her sister.  Son lives 30 minutes away.  She is independent of ADLs and some IADLs.  She  drives.  She worked at [employer] and denies any exposure to chemicals or pesticides.  No family history of malignancies.    PLAN:  1.  IgG lambda multiple myeloma: - We reviewed myeloma panel from 04/07/2022.  M spike is stable at 0.2 g.  Free light chain ratio has improved to 2.13 and kappa light chains improved to 35. - Reviewed labs today which showed normal white count and platelet count.  Creatinine and LFTs are normal. - Recommend continuing Revlimid, Darzalex and dexamethasone.  RTC 4 weeks for follow-up.  2.  Anemia from CKD and iron deficiency: - Last Venofer was in September 2023.  Last ferritin was 93 and percent saturation 8 on 03/10/2022.  Hemoglobin today is 10.4.  Will closely monitor.  3.  ID prophylaxis: - Continue acyclovir twice daily and aspirin 81 mg daily.  4.  Bone protection (DEXA scan 02/17/2022  T-score -0.9): - We talked about zoledronic acid/denosumab after dental evaluation. - She saw dentist who recommended dental extractions.  She does not want to do it.  5.  Hypokalemia: -Continue potassium 40 mEq daily.  Potassium today is 3.4.  6.  Right-sided neck pain since 02/15/2022: - MRI C-spine on 1 24,024 showed degenerative disc disease and suspected lytic lesion.  We have ordered a CT scan but she did not do the scan as her pain completely resolved.       Orders placed this encounter:  No orders of the defined types were placed in this encounter.       Derek Jack, MD [place] [phone]

## 2022-04-21 NOTE — Patient Instructions (Addendum)
[place] at [facility] Discharge Instructions   You were seen and examined today by Dr. Delton Coombes.  He reviewed the results of your lab work which are normal/stable from 2 weeks ago. Your myeloma labs from two weeks ago were stable also.   We will proceed with your treatment today.  Continue Revlimid as prescribed. Continue steroid (5 little pills) once a week.   Return as scheduled.      Thank you for choosing [place] at [facility] to provide your oncology and hematology care.  To afford each patient quality time with our provider, please arrive at least 15 minutes before your scheduled appointment time.   If you have a lab appointment with the [facility] please come in thru the Main Entrance and check in at the main information desk.  You need to re-schedule your appointment should you arrive 10 or more minutes late.  We strive to give you quality time with our providers, and arriving late affects you and other patients whose appointments are after yours.  Also, if you no show three or more times for appointments you may be dismissed from the clinic at the providers discretion.     Again, thank you for choosing [facility].  Our hope is that these requests will decrease the amount of time that you wait before being seen by our physicians.       _____________________________________________________________  Should you have questions after your visit to [facility], please contact our office at [phone] and follow the prompts.  Our office hours are 8:00 a.m. and 4:30 p.m. Monday - Friday.  Please note that voicemails left after 4:00 p.m. may not be returned until the following business day.  We are closed weekends and major holidays.  You do have access to a nurse 24-7, just call the main number to the clinic [phone] and do not press any options, hold on the line and a nurse will answer the phone.     For prescription refill requests, have your pharmacy contact our office and allow 72 hours.    Due to Covid, you will need to wear a mask upon entering the hospital. If you do not have a mask, a mask will be given to you at the Main Entrance upon arrival. For doctor visits, patients may have 1 support person age 19 or older with them. For treatment visits, patients can not have anyone with them due to social distancing guidelines and our immunocompromised population.

## 2022-04-21 NOTE — Progress Notes (Signed)
Patient is taking Revlimid as prescribed.  She has not missed any doses and reports no side effects at this time.

## 2022-04-21 NOTE — Progress Notes (Signed)
Patients port flushed without difficulty.  Good blood return noted with no bruising or swelling noted at site.  Patient to remain accessed for possible fluids.  

## 2022-04-21 NOTE — Patient Instructions (Signed)
[facility]  Discharge Instructions: Thank you for choosing [facility] to provide your oncology and hematology care.  If you have a lab appointment with the [facility], please come in thru the Main Entrance and check in at the main information desk.  Wear comfortable clothing and clothing appropriate for easy access to any Portacath or PICC line.   We strive to give you quality time with your provider. You may need to reschedule your appointment if you arrive late (15 or more minutes).  Arriving late affects you and other patients whose appointments are after yours.  Also, if you miss three or more appointments without notifying the office, you may be dismissed from the clinic at the provider's discretion.      For prescription refill requests, have your pharmacy contact our office and allow 72 hours for refills to be completed.    Today you received the following chemotherapy and/or immunotherapy agents Daratumumab      To help prevent nausea and vomiting after your treatment, we encourage you to take your nausea medication as directed.  BELOW ARE SYMPTOMS THAT SHOULD BE REPORTED IMMEDIATELY: *FEVER GREATER THAN 100.4 F (38 C) OR HIGHER *CHILLS OR SWEATING *NAUSEA AND VOMITING THAT IS NOT CONTROLLED WITH YOUR NAUSEA MEDICATION *UNUSUAL SHORTNESS OF BREATH *UNUSUAL BRUISING OR BLEEDING *URINARY PROBLEMS (pain or burning when urinating, or frequent urination) *BOWEL PROBLEMS (unusual diarrhea, constipation, pain near the anus) TENDERNESS IN MOUTH AND THROAT WITH OR WITHOUT PRESENCE OF ULCERS (sore throat, sores in mouth, or a toothache) UNUSUAL RASH, SWELLING OR PAIN  UNUSUAL VAGINAL DISCHARGE OR ITCHING   Items with * indicate a potential emergency and should be followed up as soon as possible or go to the Emergency Department if any problems should occur.  Please show the CHEMOTHERAPY ALERT CARD or IMMUNOTHERAPY ALERT CARD at check-in to the  Emergency Department and triage nurse.  Should you have questions after your visit or need to cancel or reschedule your appointment, please contact [facility] [phone]  and follow the prompts.  Office hours are 8:00 a.m. to 4:30 p.m. Monday - Friday. Please note that voicemails left after 4:00 p.m. may not be returned until the following business day.  We are closed weekends and major holidays. You have access to a nurse at all times for urgent questions. Please call the main number to the clinic [phone] and follow the prompts.  For any non-urgent questions, you may also contact your provider using MyChart. We now offer e-Visits for anyone 77 and older to request care online for non-urgent symptoms. For details visit [email or web address].   Also download the MyChart app! Go to the app store, search "MyChart", open the app, select [place], and log in with your MyChart username and password.

## 2022-04-21 NOTE — Progress Notes (Signed)
Labs reviewed with MD today at office visit, ok to treat with the labs that are resulted at this time per MD.

## 2022-04-22 DIAGNOSIS — C884 Extranodal marginal zone B-cell lymphoma of mucosa-associated lymphoid tissue [MALT-lymphoma]: Secondary | ICD-10-CM | POA: Diagnosis not present

## 2022-04-22 DIAGNOSIS — I10 Essential (primary) hypertension: Secondary | ICD-10-CM | POA: Diagnosis not present

## 2022-05-05 ENCOUNTER — Ambulatory Visit: Payer: BLUE CROSS/BLUE SHIELD | Admitting: Hematology

## 2022-05-05 ENCOUNTER — Inpatient Hospital Stay: Payer: Medicare Other

## 2022-05-05 ENCOUNTER — Inpatient Hospital Stay: Payer: Medicare Other | Attending: Hematology

## 2022-05-05 VITALS — BP 147/80 | HR 78 | Temp 98.2°F | Resp 18 | Wt 144.0 lb

## 2022-05-05 DIAGNOSIS — Z79899 Other long term (current) drug therapy: Secondary | ICD-10-CM | POA: Diagnosis not present

## 2022-05-05 DIAGNOSIS — N189 Chronic kidney disease, unspecified: Secondary | ICD-10-CM | POA: Diagnosis not present

## 2022-05-05 DIAGNOSIS — D631 Anemia in chronic kidney disease: Secondary | ICD-10-CM | POA: Insufficient documentation

## 2022-05-05 DIAGNOSIS — I739 Peripheral vascular disease, unspecified: Secondary | ICD-10-CM | POA: Insufficient documentation

## 2022-05-05 DIAGNOSIS — Z5112 Encounter for antineoplastic immunotherapy: Secondary | ICD-10-CM | POA: Diagnosis not present

## 2022-05-05 DIAGNOSIS — E876 Hypokalemia: Secondary | ICD-10-CM | POA: Diagnosis not present

## 2022-05-05 DIAGNOSIS — D473 Essential (hemorrhagic) thrombocythemia: Secondary | ICD-10-CM | POA: Insufficient documentation

## 2022-05-05 DIAGNOSIS — Z79624 Long term (current) use of inhibitors of nucleotide synthesis: Secondary | ICD-10-CM | POA: Diagnosis not present

## 2022-05-05 DIAGNOSIS — Z7961 Long term (current) use of immunomodulator: Secondary | ICD-10-CM | POA: Diagnosis not present

## 2022-05-05 DIAGNOSIS — Z8572 Personal history of non-Hodgkin lymphomas: Secondary | ICD-10-CM | POA: Insufficient documentation

## 2022-05-05 DIAGNOSIS — C9000 Multiple myeloma not having achieved remission: Secondary | ICD-10-CM | POA: Diagnosis not present

## 2022-05-05 DIAGNOSIS — M542 Cervicalgia: Secondary | ICD-10-CM | POA: Diagnosis not present

## 2022-05-05 DIAGNOSIS — I129 Hypertensive chronic kidney disease with stage 1 through stage 4 chronic kidney disease, or unspecified chronic kidney disease: Secondary | ICD-10-CM | POA: Diagnosis not present

## 2022-05-05 DIAGNOSIS — E7800 Pure hypercholesterolemia, unspecified: Secondary | ICD-10-CM | POA: Insufficient documentation

## 2022-05-05 DIAGNOSIS — M545 Low back pain, unspecified: Secondary | ICD-10-CM | POA: Diagnosis not present

## 2022-05-05 LAB — CBC WITH DIFFERENTIAL/PLATELET
Abs Immature Granulocytes: 0.02 10*3/uL (ref 0.00–0.07)
Basophils Absolute: 0.1 10*3/uL (ref 0.0–0.1)
Basophils Relative: 1 %
Eosinophils Absolute: 0.1 10*3/uL (ref 0.0–0.5)
Eosinophils Relative: 1 %
HCT: 32.3 % — ABNORMAL LOW (ref 36.0–46.0)
Hemoglobin: 10.3 g/dL — ABNORMAL LOW (ref 12.0–15.0)
Immature Granulocytes: 0 %
Lymphocytes Relative: 33 %
Lymphs Abs: 1.9 10*3/uL (ref 0.7–4.0)
MCH: 28.7 pg (ref 26.0–34.0)
MCHC: 31.9 g/dL (ref 30.0–36.0)
MCV: 90 fL (ref 80.0–100.0)
Monocytes Absolute: 0.6 10*3/uL (ref 0.1–1.0)
Monocytes Relative: 10 %
Neutro Abs: 3.1 10*3/uL (ref 1.7–7.7)
Neutrophils Relative %: 55 %
Platelets: 504 10*3/uL — ABNORMAL HIGH (ref 150–400)
RBC: 3.59 MIL/uL — ABNORMAL LOW (ref 3.87–5.11)
RDW: 21.4 % — ABNORMAL HIGH (ref 11.5–15.5)
Smear Review: INCREASED
WBC Morphology: REACTIVE
WBC: 5.7 10*3/uL (ref 4.0–10.5)
nRBC: 0 % (ref 0.0–0.2)

## 2022-05-05 LAB — COMPREHENSIVE METABOLIC PANEL
ALT: 10 U/L (ref 0–44)
AST: 11 U/L — ABNORMAL LOW (ref 15–41)
Albumin: 3 g/dL — ABNORMAL LOW (ref 3.5–5.0)
Alkaline Phosphatase: 57 U/L (ref 38–126)
Anion gap: 9 (ref 5–15)
BUN: 8 mg/dL (ref 8–23)
CO2: 24 mmol/L (ref 22–32)
Calcium: 8.5 mg/dL — ABNORMAL LOW (ref 8.9–10.3)
Chloride: 106 mmol/L (ref 98–111)
Creatinine, Ser: 0.87 mg/dL (ref 0.44–1.00)
GFR, Estimated: >60 mL/min (ref >60)
Glucose, Bld: 79 mg/dL (ref 70–99)
Potassium: 3.3 mmol/L — ABNORMAL LOW (ref 3.5–5.1)
Sodium: 139 mmol/L (ref 135–145)
Total Bilirubin: 0.8 mg/dL (ref 0.3–1.2)
Total Protein: 5.5 g/dL — ABNORMAL LOW (ref 6.5–8.1)

## 2022-05-05 LAB — MAGNESIUM: Magnesium: 1.9 mg/dL (ref 1.7–2.4)

## 2022-05-05 MED ORDER — SODIUM CHLORIDE 0.9% FLUSH
10.0000 mL | INTRAVENOUS | Status: DC | PRN
  Administered 2022-05-05: 10 mL

## 2022-05-05 MED ORDER — ACETAMINOPHEN 325 MG PO TABS
650.0000 mg | ORAL_TABLET | Freq: Once | ORAL | Status: AC
  Administered 2022-05-05: 650 mg via ORAL
  Filled 2022-05-05: qty 2

## 2022-05-05 MED ORDER — CETIRIZINE HCL 10 MG PO TABS
10.0000 mg | ORAL_TABLET | Freq: Once | ORAL | Status: AC
  Administered 2022-05-05: 10 mg via ORAL
  Filled 2022-05-05: qty 1

## 2022-05-05 MED ORDER — DARATUMUMAB-HYALURONIDASE-FIHJ 1800-30000 MG-UT/15ML ~~LOC~~ SOLN
1800.0000 mg | Freq: Once | SUBCUTANEOUS | Status: AC
  Administered 2022-05-05: 1800 mg via SUBCUTANEOUS
  Filled 2022-05-05: qty 15

## 2022-05-05 MED ORDER — HEPARIN SOD (PORK) LOCK FLUSH 100 UNIT/ML IV SOLN
500.0000 [IU] | Freq: Once | INTRAVENOUS | Status: AC
  Administered 2022-05-05: 500 [IU] via INTRAVENOUS

## 2022-05-05 NOTE — Patient Instructions (Signed)
[facility] [place]  Discharge Instructions: Thank you for choosing [facility] to provide your oncology and hematology care.  If you have a lab appointment with the Cancer Center, please come in thru the Main Entrance and check in at the main information desk.  Wear comfortable clothing and clothing appropriate for easy access to any Portacath or PICC line.   We strive to give you quality time with your provider. You may need to reschedule your appointment if you arrive late (15 or more minutes).  Arriving late affects you and other patients whose appointments are after yours.  Also, if you miss three or more appointments without notifying the office, you may be dismissed from the clinic at the provider's discretion.      For prescription refill requests, have your pharmacy contact our office and allow 72 hours for refills to be completed.    Today you received the following chemotherapy and/or immunotherapy agents Daratumumab      To help prevent nausea and vomiting after your treatment, we encourage you to take your nausea medication as directed.  BELOW ARE SYMPTOMS THAT SHOULD BE REPORTED IMMEDIATELY: *FEVER GREATER THAN 100.4 F (38 C) OR HIGHER *CHILLS OR SWEATING *NAUSEA AND VOMITING THAT IS NOT CONTROLLED WITH YOUR NAUSEA MEDICATION *UNUSUAL SHORTNESS OF BREATH *UNUSUAL BRUISING OR BLEEDING *URINARY PROBLEMS (pain or burning when urinating, or frequent urination) *BOWEL PROBLEMS (unusual diarrhea, constipation, pain near the anus) TENDERNESS IN MOUTH AND THROAT WITH OR WITHOUT PRESENCE OF ULCERS (sore throat, sores in mouth, or a toothache) UNUSUAL RASH, SWELLING OR PAIN  UNUSUAL VAGINAL DISCHARGE OR ITCHING   Items with * indicate a potential emergency and should be followed up as soon as possible or go to the Emergency Department if any problems should occur.  Please show the CHEMOTHERAPY ALERT CARD or IMMUNOTHERAPY ALERT CARD at check-in to the  Emergency Department and triage nurse.  Should you have questions after your visit or need to cancel or reschedule your appointment, please contact [facility] [phone]  and follow the prompts.  Office hours are 8:00 a.m. to 4:30 p.m. Monday - Friday. Please note that voicemails left after 4:00 p.m. may not be returned until the following business day.  We are closed weekends and major holidays. You have access to a nurse at all times for urgent questions. Please call the main number to the clinic [phone] and follow the prompts.  For any non-urgent questions, you may also contact your provider using MyChart. We now offer e-Visits for anyone 18 and older to request care online for non-urgent symptoms. For details visit [email or web address].   Also download the MyChart app! Go to the app store, search "MyChart", open the app, select [place], and log in with your MyChart username and password.   

## 2022-05-05 NOTE — Progress Notes (Signed)
Patient presents today for Daratumumab injection per providers order.  Vital signs and labs within parameters for treatment.  Stable during administration without incident; injection site WNL; see MAR for injection details.  Patient tolerated procedure well and without incident.  No questions or complaints noted at this time.  

## 2022-05-06 LAB — KAPPA/LAMBDA LIGHT CHAINS
Kappa free light chain: 45.9 mg/L — ABNORMAL HIGH (ref 3.3–19.4)
Kappa, lambda light chain ratio: 2.25 — ABNORMAL HIGH (ref 0.26–1.65)
Lambda free light chains: 20.4 mg/L (ref 5.7–26.3)

## 2022-05-07 LAB — PROTEIN ELECTROPHORESIS, SERUM
A/G Ratio: 1.4 (ref 0.7–1.7)
Albumin ELP: 2.9 g/dL (ref 2.9–4.4)
Alpha-1-Globulin: 0.3 g/dL (ref 0.0–0.4)
Alpha-2-Globulin: 0.5 g/dL (ref 0.4–1.0)
Beta Globulin: 0.9 g/dL (ref 0.7–1.3)
Gamma Globulin: 0.4 g/dL (ref 0.4–1.8)
Globulin, Total: 2.1 g/dL — ABNORMAL LOW (ref 2.2–3.9)
M-Spike, %: 0.2 g/dL — ABNORMAL HIGH
Total Protein ELP: 5 g/dL — ABNORMAL LOW (ref 6.0–8.5)

## 2022-05-10 DIAGNOSIS — D473 Essential (hemorrhagic) thrombocythemia: Secondary | ICD-10-CM | POA: Diagnosis not present

## 2022-05-10 DIAGNOSIS — I10 Essential (primary) hypertension: Secondary | ICD-10-CM | POA: Diagnosis not present

## 2022-05-10 DIAGNOSIS — R609 Edema, unspecified: Secondary | ICD-10-CM | POA: Diagnosis not present

## 2022-05-10 DIAGNOSIS — C9002 Multiple myeloma in relapse: Secondary | ICD-10-CM | POA: Diagnosis not present

## 2022-05-14 ENCOUNTER — Other Ambulatory Visit: Payer: Self-pay

## 2022-05-14 MED ORDER — LENALIDOMIDE 15 MG PO CAPS: 15.0000 mg | ORAL_CAPSULE | Freq: Every day | ORAL | 0 refills | Status: DC

## 2022-05-14 NOTE — Telephone Encounter (Signed)
Chart reviewed. Revlimid refilled per last office note with Dr. Katragadda.  

## 2022-05-19 ENCOUNTER — Inpatient Hospital Stay: Payer: Medicare Other

## 2022-05-19 ENCOUNTER — Inpatient Hospital Stay: Payer: Medicare Other | Admitting: Hematology

## 2022-05-19 VITALS — BP 130/95 | HR 63 | Temp 97.7°F | Resp 20

## 2022-05-19 DIAGNOSIS — E876 Hypokalemia: Secondary | ICD-10-CM | POA: Diagnosis not present

## 2022-05-19 DIAGNOSIS — C9000 Multiple myeloma not having achieved remission: Secondary | ICD-10-CM

## 2022-05-19 DIAGNOSIS — Z7961 Long term (current) use of immunomodulator: Secondary | ICD-10-CM | POA: Diagnosis not present

## 2022-05-19 DIAGNOSIS — Z5112 Encounter for antineoplastic immunotherapy: Secondary | ICD-10-CM | POA: Diagnosis not present

## 2022-05-19 DIAGNOSIS — I129 Hypertensive chronic kidney disease with stage 1 through stage 4 chronic kidney disease, or unspecified chronic kidney disease: Secondary | ICD-10-CM | POA: Diagnosis not present

## 2022-05-19 DIAGNOSIS — E7800 Pure hypercholesterolemia, unspecified: Secondary | ICD-10-CM | POA: Diagnosis not present

## 2022-05-19 DIAGNOSIS — Z8572 Personal history of non-Hodgkin lymphomas: Secondary | ICD-10-CM | POA: Diagnosis not present

## 2022-05-19 DIAGNOSIS — M542 Cervicalgia: Secondary | ICD-10-CM | POA: Diagnosis not present

## 2022-05-19 DIAGNOSIS — I739 Peripheral vascular disease, unspecified: Secondary | ICD-10-CM | POA: Diagnosis not present

## 2022-05-19 DIAGNOSIS — D473 Essential (hemorrhagic) thrombocythemia: Secondary | ICD-10-CM | POA: Diagnosis not present

## 2022-05-19 DIAGNOSIS — M545 Low back pain, unspecified: Secondary | ICD-10-CM | POA: Diagnosis not present

## 2022-05-19 DIAGNOSIS — N189 Chronic kidney disease, unspecified: Secondary | ICD-10-CM | POA: Diagnosis not present

## 2022-05-19 DIAGNOSIS — D631 Anemia in chronic kidney disease: Secondary | ICD-10-CM | POA: Diagnosis not present

## 2022-05-19 DIAGNOSIS — Z79624 Long term (current) use of inhibitors of nucleotide synthesis: Secondary | ICD-10-CM | POA: Diagnosis not present

## 2022-05-19 DIAGNOSIS — Z79899 Other long term (current) drug therapy: Secondary | ICD-10-CM | POA: Diagnosis not present

## 2022-05-19 DIAGNOSIS — Z95828 Presence of other vascular implants and grafts: Secondary | ICD-10-CM

## 2022-05-19 LAB — CBC WITH DIFFERENTIAL/PLATELET
Abs Immature Granulocytes: 0.01 10*3/uL (ref 0.00–0.07)
Basophils Absolute: 0.1 10*3/uL (ref 0.0–0.1)
Basophils Relative: 2 %
Eosinophils Absolute: 0.4 10*3/uL (ref 0.0–0.5)
Eosinophils Relative: 9 %
HCT: 31.6 % — ABNORMAL LOW (ref 36.0–46.0)
Hemoglobin: 10.1 g/dL — ABNORMAL LOW (ref 12.0–15.0)
Immature Granulocytes: 0 %
Lymphocytes Relative: 48 %
Lymphs Abs: 2.1 10*3/uL (ref 0.7–4.0)
MCH: 28.6 pg (ref 26.0–34.0)
MCHC: 32 g/dL (ref 30.0–36.0)
MCV: 89.5 fL (ref 80.0–100.0)
Monocytes Absolute: 0.8 10*3/uL (ref 0.1–1.0)
Monocytes Relative: 20 %
Neutro Abs: 0.9 10*3/uL — ABNORMAL LOW (ref 1.7–7.7)
Neutrophils Relative %: 21 %
Platelets: 314 10*3/uL (ref 150–400)
RBC: 3.53 MIL/uL — ABNORMAL LOW (ref 3.87–5.11)
RDW: 20.1 % — ABNORMAL HIGH (ref 11.5–15.5)
WBC: 4.3 10*3/uL (ref 4.0–10.5)
nRBC: 0 % (ref 0.0–0.2)

## 2022-05-19 LAB — COMPREHENSIVE METABOLIC PANEL
ALT: 8 U/L (ref 0–44)
AST: 10 U/L — ABNORMAL LOW (ref 15–41)
Albumin: 2.9 g/dL — ABNORMAL LOW (ref 3.5–5.0)
Alkaline Phosphatase: 52 U/L (ref 38–126)
Anion gap: 6 (ref 5–15)
BUN: 13 mg/dL (ref 8–23)
CO2: 24 mmol/L (ref 22–32)
Calcium: 8.4 mg/dL — ABNORMAL LOW (ref 8.9–10.3)
Chloride: 107 mmol/L (ref 98–111)
Creatinine, Ser: 1 mg/dL (ref 0.44–1.00)
GFR, Estimated: 54 mL/min — ABNORMAL LOW (ref >60)
Glucose, Bld: 82 mg/dL (ref 70–99)
Potassium: 4 mmol/L (ref 3.5–5.1)
Sodium: 137 mmol/L (ref 135–145)
Total Bilirubin: 0.5 mg/dL (ref 0.3–1.2)
Total Protein: 5.7 g/dL — ABNORMAL LOW (ref 6.5–8.1)

## 2022-05-19 LAB — MAGNESIUM: Magnesium: 1.9 mg/dL (ref 1.7–2.4)

## 2022-05-19 MED ORDER — SODIUM CHLORIDE 0.9% FLUSH
10.0000 mL | Freq: Once | INTRAVENOUS | Status: AC
  Administered 2022-05-19: 10 mL via INTRAVENOUS

## 2022-05-19 MED ORDER — HEPARIN SOD (PORK) LOCK FLUSH 100 UNIT/ML IV SOLN
500.0000 [IU] | Freq: Once | INTRAVENOUS | Status: AC
  Administered 2022-05-19: 500 [IU] via INTRAVENOUS

## 2022-05-19 MED ORDER — SODIUM CHLORIDE 0.9% FLUSH
10.0000 mL | Freq: Once | INTRAVENOUS | Status: AC
  Administered 2022-05-19: 10 mL

## 2022-05-19 MED ORDER — ACETAMINOPHEN 325 MG PO TABS
650.0000 mg | ORAL_TABLET | Freq: Once | ORAL | Status: AC
  Administered 2022-05-19: 650 mg via ORAL
  Filled 2022-05-19: qty 2

## 2022-05-19 MED ORDER — CETIRIZINE HCL 10 MG PO TABS
10.0000 mg | ORAL_TABLET | Freq: Once | ORAL | Status: AC
  Administered 2022-05-19: 10 mg via ORAL
  Filled 2022-05-19: qty 1

## 2022-05-19 MED ORDER — DARATUMUMAB-HYALURONIDASE-FIHJ 1800-30000 MG-UT/15ML ~~LOC~~ SOLN
1800.0000 mg | Freq: Once | SUBCUTANEOUS | Status: AC
  Administered 2022-05-19: 1800 mg via SUBCUTANEOUS
  Filled 2022-05-19: qty 15

## 2022-05-19 NOTE — Progress Notes (Signed)
Patient presents today for Darzalex Faspro injection. Vital signs within parameters for treatment. Labs pending.    ANC 0.9. Proceed with treatment per Dr. Delton Coombes. MD aware.   Patient states she is taking Revlimid as prescribed with no side effects or complaints noted.   Treatment given today per MD orders. Tolerated without adverse affects. Vital signs stable. No complaints at this time. Discharged from clinic by wheel chair in stable condition. Alert and oriented x 3. F/U with [facility] as scheduled.

## 2022-05-19 NOTE — Patient Instructions (Signed)
[place] at [facility] Discharge Instructions   You were seen and examined today by Dr. Delton Coombes.  He reviewed the results of your lab work which are normal/stable.   We will proceed with your treatment today.   Continue Revlimid as prescribed.   Continue dexamethasone (5 pills) once a week.   Return as scheduled.    Thank you for choosing [place] at [facility] to provide your oncology and hematology care.  To afford each patient quality time with our provider, please arrive at least 15 minutes before your scheduled appointment time.   If you have a lab appointment with the [facility] please come in thru the Main Entrance and check in at the main information desk.  You need to re-schedule your appointment should you arrive 10 or more minutes late.  We strive to give you quality time with our providers, and arriving late affects you and other patients whose appointments are after yours.  Also, if you no show three or more times for appointments you may be dismissed from the clinic at the providers discretion.     Again, thank you for choosing [facility].  Our hope is that these requests will decrease the amount of time that you wait before being seen by our physicians.       _____________________________________________________________  Should you have questions after your visit to [facility], please contact our office at [phone] and follow the prompts.  Our office hours are 8:00 a.m. and 4:30 p.m. Monday - Friday.  Please note that voicemails left after 4:00 p.m. may not be returned until the following business day.  We are closed weekends and major holidays.  You do have access to a nurse 24-7, just call the main number to the clinic [phone] and do not press any options, hold on the line and a nurse will answer the phone.    For prescription refill requests, have your pharmacy contact our  office and allow 72 hours.    Due to Covid, you will need to wear a mask upon entering the hospital. If you do not have a mask, a mask will be given to you at the Main Entrance upon arrival. For doctor visits, patients may have 1 support person age 69 or older with them. For treatment visits, patients can not have anyone with them due to social distancing guidelines and our immunocompromised population.

## 2022-05-19 NOTE — Patient Instructions (Signed)
[facility]  Discharge Instructions: Thank you for choosing [facility] to provide your oncology and hematology care.  If you have a lab appointment with the Cancer Center, please come in thru the Main Entrance and check in at the main information desk.  Wear comfortable clothing and clothing appropriate for easy access to any Portacath or PICC line.   We strive to give you quality time with your provider. You may need to reschedule your appointment if you arrive late (15 or more minutes).  Arriving late affects you and other patients whose appointments are after yours.  Also, if you miss three or more appointments without notifying the office, you may be dismissed from the clinic at the provider's discretion.      For prescription refill requests, have your pharmacy contact our office and allow 72 hours for refills to be completed.    Today you received the following chemotherapy and/or immunotherapy agents Darzalex Faspro . Daratumumab Injection What is this medication? DARATUMUMAB (dar a toom ue mab) treats multiple myeloma, a type of bone marrow cancer. It works by helping your immune system slow or stop the spread of cancer cells. It is a monoclonal antibody. This medicine may be used for other purposes; ask your health care provider or pharmacist if you have questions. COMMON BRAND NAME(S): DARZALEX What should I tell my care team before I take this medication? They need to know if you have any of these conditions: Hereditary fructose intolerance Infection, such as chickenpox, herpes, hepatitis B Lung or breathing disease, such as asthma, COPD An unusual or allergic reaction to daratumumab, sorbitol, other medications, foods, dyes, or preservatives Pregnant or trying to get pregnant Breastfeeding How should I use this medication? This medication is injected into a vein. It is given by your care team in a hospital or clinic setting. Talk to your care  team about the use of this medication in children. Special care may be needed. Overdosage: If you think you have taken too much of this medicine contact a poison control center or emergency room at once. NOTE: This medicine is only for you. Do not share this medicine with others. What if I miss a dose? Keep appointments for follow-up doses. It is important not to miss your dose. Call your care team if you are unable to keep an appointment. What may interact with this medication? Interactions have not been studied. This list may not describe all possible interactions. Give your health care provider a list of all the medicines, herbs, non-prescription drugs, or dietary supplements you use. Also tell them if you smoke, drink alcohol, or use illegal drugs. Some items may interact with your medicine. What should I watch for while using this medication? Your condition will be monitored carefully while you are receiving this medication. This medication can cause serious allergic reactions. To reduce your risk, your care team may give you other medication to take before receiving this one. Be sure to follow the directions from your care team. This medication can affect the results of blood tests to match your blood type. These changes can last for up to 6 months after the final dose. Your care team will do blood tests to match your blood type before you start treatment. Tell all of your care team that you are being treated with this medication before receiving a blood transfusion. This medication can affect the results of some tests used to determine treatment response; extra tests may be needed   to evaluate response. Talk to your care team if you wish to become pregnant or think you are pregnant. This medication can cause serious birth defects if taken during pregnancy and for 3 months after the last dose. A reliable form of contraception is recommended while taking this medication and for 3 months after the  last dose. Talk to your care team about effective forms of contraception. Do not breast-feed while taking this medication. What side effects may I notice from receiving this medication? Side effects that you should report to your care team as soon as possible: Allergic reactions--skin rash, itching, hives, swelling of the face, lips, tongue, or throat Infection--fever, chills, cough, sore throat, wounds that don't heal, pain or trouble when passing urine, general feeling of discomfort or being unwell Infusion reactions--chest pain, shortness of breath or trouble breathing, feeling faint or lightheaded Unusual bruising or bleeding Side effects that usually do not require medical attention (report to your care team if they continue or are bothersome): Constipation Diarrhea Fatigue Nausea Pain, tingling, or numbness in the hands or feet Swelling of the ankles, hands, or feet This list may not describe all possible side effects. Call your doctor for medical advice about side effects. You may report side effects to FDA at 1-800-FDA-1088. Where should I keep my medication? This medication is given in a hospital or clinic. It will not be stored at home. NOTE: This sheet is a summary. It may not cover all possible information. If you have questions about this medicine, talk to your doctor, pharmacist, or health care provider.  2023 Elsevier/Gold Standard (2021-06-03 00:00:00)       To help prevent nausea and vomiting after your treatment, we encourage you to take your nausea medication as directed.  BELOW ARE SYMPTOMS THAT SHOULD BE REPORTED IMMEDIATELY: *FEVER GREATER THAN 100.4 F (38 C) OR HIGHER *CHILLS OR SWEATING *NAUSEA AND VOMITING THAT IS NOT CONTROLLED WITH YOUR NAUSEA MEDICATION *UNUSUAL SHORTNESS OF BREATH *UNUSUAL BRUISING OR BLEEDING *URINARY PROBLEMS (pain or burning when urinating, or frequent urination) *BOWEL PROBLEMS (unusual diarrhea, constipation, pain near the  anus) TENDERNESS IN MOUTH AND THROAT WITH OR WITHOUT PRESENCE OF ULCERS (sore throat, sores in mouth, or a toothache) UNUSUAL RASH, SWELLING OR PAIN  UNUSUAL VAGINAL DISCHARGE OR ITCHING   Items with * indicate a potential emergency and should be followed up as soon as possible or go to the Emergency Department if any problems should occur.  Please show the CHEMOTHERAPY ALERT CARD or IMMUNOTHERAPY ALERT CARD at check-in to the Emergency Department and triage nurse.  Should you have questions after your visit or need to cancel or reschedule your appointment, please contact [facility] [phone]  and follow the prompts.  Office hours are 8:00 a.m. to 4:30 p.m. Monday - Friday. Please note that voicemails left after 4:00 p.m. may not be returned until the following business day.  We are closed weekends and major holidays. You have access to a nurse at all times for urgent questions. Please call the main number to the clinic [phone] and follow the prompts.  For any non-urgent questions, you may also contact your provider using MyChart. We now offer e-Visits for anyone 18 and older to request care online for non-urgent symptoms. For details visit [email or web address].   Also download the MyChart app! Go to the app store, search "MyChart", open the app, select [place], and log in with your MyChart username and password.   

## 2022-05-19 NOTE — Progress Notes (Signed)
Patient is taking Revlimid as prescribed.  She has not missed any doses and reports no side effects at this time.  ? ?

## 2022-05-19 NOTE — Progress Notes (Signed)
[facility] [address] [place] [address]    Clinic Day:  05/19/2022  Referring physician: Carrolyn Williamson*  Patient Care Team: Leslie Meiers, MD as PCP - General (Internal Medicine) Leslie Jack, MD as Medical Oncologist (Medical Oncology)   ASSESSMENT & PLAN:   Assessment: 1.  Biclonal IgG lambda and IgM kappa multiple myeloma, high risk: - BMBX (11/02/2021): IgG lambda plasma cells 70% - Myeloma FISH panel: Monosomy 13 (standard GIST), duplication of 1 q. (high risk), gain of 11 q. (standard risk). - Cytogenetics: Hyperdiploid E weight gain (trisomy/trisomy) of odd-numbered chromosomes and monosomy 13, standard risk.  Gain of 1 q. associated with progressive course. - PET scan (11/05/2021): No evidence of multiple myeloma.  No hypermetabolic adenopathy. - Daratumumab, Revlimid and dexamethasone cycle 1 started on 11/30/2021   2.  JAK2 positive essential thrombocytosis: - BMBX (11/02/2021): No evidence of fibrosis.  No morphological evidence of JAK2 positive ET.   3.  History of marginal zone lymphoma of the spleen: - Diagnosed in 1999, status post CHOP x7 cycles followed by rituximab 1 cycle. - Status post splenectomy.  No evidence of lymphoma on current PET scan.   4.  Social/family history: - She lives with her sister.  Son lives 30 minutes away.  She is independent of ADLs and some IADLs.  She drives.  She worked at [employer] and denies any exposure to chemicals or pesticides.  No family history of malignancies.  Plan: 1.  IgG lambda multiple myeloma: - We reviewed myeloma panel from 05/05/2022.  M spike is stable at 0.2 g.  Free light chain ratio is 2.25.  Kappa light chains slightly increased to 45 from 35. - Continue Revlimid 15 mg 3 weeks on/1 week off and Darzalex monthly.  Continue dexamethasone 20 mg weekly.  RTC 8 weeks for follow-up with repeat multiple myeloma labs.   2.  Anemia from CKD and iron  deficiency: - She had received Venofer in the past.  Hemoglobin today is 10.1.  Will closely monitor.   3.  ID prophylaxis: - Continue acyclovir twice daily and aspirin 81 mg daily.   4.  Bone protection (DEXA scan 02/17/2022 T-score -0.9): - She saw dentist who recommended dental extractions.  She does not want zoledronic acid or denosumab.   5.  Hypokalemia: - Continue potassium 40 mEq daily.  Potassium is normal.   6.  Left lower back pain radiating to the left posterior thigh: - This has started in the last 1 week.  She denies any trauma.  She woke up with this.  She is taking Toradol as needed.  She will call us if it gets worse.  Orders Placed This Encounter  Procedures   CBC with Differential    Standing Status:   Future    Standing Expiration Date:   09/09/2023   CBC with Differential    Standing Status:   Future    Standing Expiration Date:   10/07/2023    I,Leslie Williamson,acting as a scribe for Leslie Jack, MD.,have documented all relevant documentation on the behalf of Leslie Jack, MD,as directed by  Leslie Jack, MD while in the presence of Leslie Jack, MD.   I, Leslie Jack MD, have reviewed the above documentation for accuracy and completeness, and I agree with the above.   Leslie Jack, MD   3/27/20246:08 PM  CHIEF COMPLAINT:   Diagnosis: multiple myeloma    Cancer Staging  Multiple myeloma ([place]) Staging form: Plasma  Cell Myeloma and Plasma Cell Disorders, AJCC 8th Edition - Clinical stage from 11/16/2021: Albumin (g/dL): 3.1, ISS: Stage II, High-risk cytogenetics: Absent, LDH: Normal - Unsigned    Prior Therapy: none  Current Therapy:  Daratumumab, Revlimid and dexamethasone   HISTORY OF PRESENT ILLNESS:   Oncology History  NHL (non-Hodgkin's lymphoma) ([place])  07/19/1995 Pathology Results   Spleen biopsy with resection- low-grade B-cell lymphoma, splenic marginal zone type   02/20/1998 Imaging   CT  CAP-marked and extensive cervical adenopathy   02/25/1998 Pathology Results   Right cervical lymph node demonstrating large B-cell lymphoma   03/05/1998 Bone Marrow Biopsy   No evidence of bone marrow involvement   03/11/1998 - 07/18/1998 Chemotherapy   CHOP x 7 cycles   05/09/1998 Imaging   CT CAP and neck- slight interval decrease in size of para-aortic adenopathy with interval decrease in right inguinal and bi-iliac adenopathy.  Interval decrease in size and number of enlarged cervical nodes   07/10/1998 Imaging   Ct CAP and neck- unremarkable CT of chest. No enlarged retroperitoneal adenopathy withthe previously noted retroperitoneal nodes currently smaller to stable in size.  Cervical adenopathy is stable to slightly smaller in size.   08/18/1998 - 09/08/1998 Chemotherapy   Rituxan Day 1, 8, 15, 22 x 1 cycle   01/08/1999 Remission   CT CAP and neck demonstrates no adenopathy or disease   01/08/1999 Imaging   CT CAP and neck- Stable CT of chest. Stable CT abd/pelvis without adenopathy.  Negative CT of neck   Multiple myeloma ([facility])  11/16/2021 Initial Diagnosis   Multiple myeloma ([place])   11/30/2021 -  Chemotherapy   Patient is on Treatment Plan : MYELOMA  Daratumumab SQ + Lenalidomide + Dexamethasone ([facility]ara[facility]) q28d        INTERVAL HISTORY:   Leslie Williamson is a 89 y.o. female presenting to clinic today for follow up of multiple myeloma. She was last seen by me on 04/21/22.  Today, she states that she is doing well overall. Her neck pain has improved significantly with Toradol. Her appetite level is at 75%. Her energy level is at 75%.  She complains of pain along her left lower back with radiation down her posterior left leg. The pain woke her up a few nights ago and has occurred intermittently since. Her pain is worsened with walking. She denies any Hx of kidney stones or back problems. She denies any rashes or dysuria.  She is tolerating Revlimid and taking it as prescribed.  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   Vertigo, labyrinthine     Surgical History: Past Surgical History:  Procedure Laterality Date   CATARACT EXTRACTION W/PHACO Left 11/03/2015   Procedure: CATARACT EXTRACTION PHACO AND INTRAOCULAR LENS PLACEMENT ([place]);  Surgeon: Tonny Branch, MD;  Location: [facility];  Service: Ophthalmology;  Laterality: Left;  CDE: 7.74   CATARACT  EXTRACTION W/PHACO Right 11/20/2015   Procedure: CATARACT EXTRACTION PHACO AND INTRAOCULAR LENS PLACEMENT ; CDE:  8.30;  Surgeon: Tonny Branch, MD;  Location: [facility];  Service: Ophthalmology;  Laterality: Right;   IR IMAGING GUIDED PORT INSERTION  11/04/2021   PORT-A-CATH REMOVAL      Social History: Social History   Socioeconomic History   Marital status: Widowed    Spouse name: Not on file   Number of children: Not on file   Years of education: Not on file   Highest education level: Not on file  Occupational History   Not on file  Tobacco Use   Smoking status: Former   Smokeless tobacco: Never  Vaping Use   Vaping Use: Never used  Substance and Sexual Activity   Alcohol use: No   Drug use: No   Sexual activity: Not Currently  Other Topics Concern   Not on file  Social History Narrative   Not on file   Social Determinants of Health   Financial Resource Strain: Low Risk  (12/31/2019)   Overall Financial Resource Strain (CARDIA)    Difficulty of Paying Living Expenses: Not hard  at all  Food Insecurity: No Food Insecurity (12/31/2019)   Hunger Vital Sign    Worried About Running Out of Food in the Last Year: Never true    Twining in the Last Year: Never true  Transportation Needs: No Transportation Needs (12/31/2019)   PRAPARE - Hydrologist (Medical): No    Lack of Transportation (Non-Medical): No  Physical Activity: Inactive (12/31/2019)   Exercise Vital Sign    Days of Exercise per Week: 0 days    Minutes of Exercise per Session: 0 min  Stress: No Stress Concern Present (12/31/2019)   [place]    Feeling of Stress : Not at all  Social Connections: Moderately Isolated (12/31/2019)   Social Connection and Isolation Panel [NHANES]    Frequency of Communication with Friends and Family: More than three times a week    Frequency of Social Gatherings with Friends and Family: More than three times a week    Attends Religious Services: More than 4 times per year    Active Member of [employer] or Organizations: No    Attends Archivist Meetings: Never    Marital Status: Widowed  Intimate Partner Violence: Not At Risk (12/31/2019)   Humiliation, Afraid, Rape, and Kick questionnaire    Fear of Current or Ex-Partner: No    Emotionally Abused: No    Physically Abused: No    Sexually Abused: No    Family History: Family History  Problem Relation Age of Onset   Diabetes Mother     Current Medications:  Current Outpatient Medications:    acyclovir (ZOVIRAX) 400 MG tablet, Take 1 tablet (400 mg total) by mouth 2 (two) times daily., Disp: 60 tablet, Rfl: 6   amLODipine (NORVASC) 5 MG tablet, Take 1 tablet (5 mg total) by mouth daily., Disp: 30 tablet, Rfl: 2   aspirin 81 MG tablet, Take 81 mg by mouth daily., Disp: , Rfl:    atorvastatin (LIPITOR) 40 MG tablet, Take 40 mg by mouth daily., Disp: , Rfl: 2   baclofen (LIORESAL) 10 MG tablet, Take 10 mg by mouth 2  (two) times daily., Disp: , Rfl:    busPIRone (BUSPAR) 5 MG tablet, Take 5 mg by mouth at bedtime as needed (anxiety/sleep)., Disp: , Rfl:  cilostazol (PLETAL) 100 MG tablet, Take 100 mg by mouth 2 (two) times daily., Disp: , Rfl:    dexamethasone (DECADRON) 4 MG tablet, Take 20 mg (5 tablets) by mouth weekly every Wednesday morning, Disp: 20 tablet, Rfl: 6   ferrous sulfate 325 (65 FE) MG EC tablet, Take 1 tablet (325 mg total) by mouth daily with breakfast., Disp: 90 tablet, Rfl: 3   furosemide (LASIX) 20 MG tablet, Take 20 mg by mouth daily as needed., Disp: , Rfl:    ketorolac (TORADOL) 10 MG tablet, Take 1 tablet (10 mg total) by mouth every 6 (six) hours as needed., Disp: 30 tablet, Rfl: 0   lenalidomide (REVLIMID) 15 MG capsule, Take 1 capsule (15 mg total) by mouth daily. Take for 21 days on, 7 days off., Disp: 21 capsule, Rfl: 0   lidocaine-prilocaine (EMLA) cream, Apply 1 Application topically as needed., Disp: 30 g, Rfl: 1   potassium chloride SA (KLOR-CON M) 20 MEQ tablet, Take 2 tablets (40 mEq total) by mouth daily., Disp: 60 tablet, Rfl: 4   prochlorperazine (COMPAZINE) 10 MG tablet, Take by mouth., Disp: , Rfl:    prochlorperazine (COMPAZINE) 5 MG tablet, Take 1 tablet (5 mg total) by mouth every 6 (six) hours as needed for nausea or vomiting., Disp: 30 tablet, Rfl: 6   tiZANidine (ZANAFLEX) 2 MG tablet, Take 2 mg by mouth 2 (two) times daily., Disp: , Rfl:    traMADol (ULTRAM) 50 MG tablet, Take 1 tablet (50 mg total) by mouth every 12 (twelve) hours as needed., Disp: 4 tablet, Rfl: 0   traZODone (DESYREL) 50 MG tablet, Take 50 mg by mouth at bedtime as needed., Disp: , Rfl:    Allergies: Allergies  Allergen Reactions   Penicillins Rash    REVIEW OF SYSTEMS:   Review of Systems  Constitutional:  Negative for chills, fatigue and fever.  HENT:   Negative for lump/mass, mouth sores, nosebleeds, sore throat and trouble swallowing.   Eyes:  Negative for eye problems.   Respiratory:  Negative for cough and shortness of breath.   Cardiovascular:  Negative for chest pain, leg swelling and palpitations.  Gastrointestinal:  Negative for abdominal pain, constipation, diarrhea, nausea and vomiting.  Genitourinary:  Negative for bladder incontinence, difficulty urinating, dysuria, frequency, hematuria and nocturia.   Musculoskeletal:  Positive for flank pain (left flank radiating into left groin). Negative for arthralgias, back pain and neck pain.  Skin:  Negative for itching and rash.  Neurological:  Negative for dizziness, headaches and numbness.       + tingling in hands/feet  Hematological:  Does not bruise/bleed easily.  Psychiatric/Behavioral:  Negative for depression, sleep disturbance and suicidal ideas. The patient is not nervous/anxious.   All other systems reviewed and are negative.    VITALS:   There were no vitals taken for this visit.  Wt Readings from Last 3 Encounters:  05/05/22 144 lb (65.3 kg)  04/21/22 143 lb 6.4 oz (65 kg)  04/07/22 144 lb 4.8 oz (65.5 kg)    There is no height or weight on file to calculate BMI.  Performance status (ECOG): 1 - Symptomatic but completely ambulatory  PHYSICAL EXAM:   Physical Exam Vitals and nursing note reviewed. Exam conducted with a chaperone present.  Constitutional:      Appearance: Normal appearance.  Cardiovascular:     Rate and Rhythm: Normal rate and regular rhythm.     Pulses: Normal pulses.     Heart sounds: Normal heart sounds.  Pulmonary:     Effort: Pulmonary effort is normal.     Breath sounds: Normal breath sounds.  Abdominal:     Palpations: Abdomen is soft. There is no hepatomegaly, splenomegaly or mass.     Tenderness: There is no abdominal tenderness.  Musculoskeletal:     Right lower leg: No edema.     Left lower leg: No edema.     Comments: Left lower back pain radiating down posterior thigh  Lymphadenopathy:     Cervical: No cervical adenopathy.     Right cervical:  No superficial, deep or posterior cervical adenopathy.    Left cervical: No superficial, deep or posterior cervical adenopathy.     Upper Body:     Right upper body: No supraclavicular or axillary adenopathy.     Left upper body: No supraclavicular or axillary adenopathy.  Neurological:     General: No focal deficit present.     Mental Status: She is alert and oriented to person, place, and time.  Psychiatric:        Mood and Affect: Mood normal.        Behavior: Behavior normal.     LABS:      Latest Ref Rng & Units 05/19/2022   12:09 PM 05/05/2022   12:32 PM 04/21/2022   11:21 AM  CBC  WBC 4.0 - 10.5 K/uL 4.3  5.7  4.7   Hemoglobin 12.0 - 15.0 g/dL 10.1  10.3  10.4   Hematocrit 36.0 - 46.0 % 31.6  32.3  32.4   Platelets 150 - 400 K/uL 314  504  338       Latest Ref Rng & Units 05/19/2022   12:09 PM 05/05/2022   12:32 PM 04/21/2022   11:21 AM  CMP  Glucose 70 - 99 mg/dL 82  79  94   BUN 8 - 23 mg/dL 13  8  12    Creatinine 0.44 - 1.00 mg/dL 1.00  0.87  0.91   Sodium 135 - 145 mmol/L 137  139  134   Potassium 3.5 - 5.1 mmol/L 4.0  3.3  3.4   Chloride 98 - 111 mmol/L 107  106  106   CO2 22 - 32 mmol/L 24  24  23    Calcium 8.9 - 10.3 mg/dL 8.4  8.5  8.3   Total Protein 6.5 - 8.1 g/dL 5.7  5.5  5.6   Total Bilirubin 0.3 - 1.2 mg/dL 0.5  0.8  0.7   Alkaline Phos 38 - 126 U/L 52  57  59   AST 15 - 41 U/L 10  11  13    ALT 0 - 44 U/L 8  10  9       No results found for: "CEA1", "CEA" / No results found for: "CEA1", "CEA" No results found for: "PSA1" No results found for: "CAN199" No results found for: "CAN125"  Lab Results  Component Value Date   TOTALPROTELP 5.0 (L) 05/05/2022   ALBUMINELP 2.9 05/05/2022   A1GS 0.3 05/05/2022   A2GS 0.5 05/05/2022   BETS 0.9 05/05/2022   GAMS 0.4 05/05/2022   MSPIKE 0.2 (H) 05/05/2022   SPEI Comment 05/05/2022   Lab Results  Component Value Date   TIBC 249 (L) 03/10/2022   TIBC 291 09/30/2021   TIBC 268 07/22/2021   FERRITIN 93  03/10/2022   FERRITIN 45 09/30/2021   FERRITIN 126 08/07/2021   IRONPCTSAT 8 (L) 03/10/2022   IRONPCTSAT 19 09/30/2021   IRONPCTSAT 27 07/22/2021  Lab Results  Component Value Date   LDH 154 09/30/2021   LDH 148 08/05/2021   LDH 122 07/22/2021     STUDIES:   No results found.

## 2022-05-25 ENCOUNTER — Other Ambulatory Visit: Payer: Self-pay

## 2022-05-31 ENCOUNTER — Encounter (HOSPITAL_COMMUNITY): Payer: Self-pay

## 2022-05-31 ENCOUNTER — Other Ambulatory Visit: Payer: Self-pay

## 2022-05-31 ENCOUNTER — Emergency Department (HOSPITAL_COMMUNITY): Payer: Medicare Other

## 2022-05-31 ENCOUNTER — Telehealth: Payer: Self-pay | Admitting: *Deleted

## 2022-05-31 ENCOUNTER — Inpatient Hospital Stay (HOSPITAL_COMMUNITY)
Admission: EM | Admit: 2022-05-31 | Discharge: 2022-06-04 | DRG: 291 | Disposition: A | Discharge status: Home Health | Payer: Medicare Other | Attending: Internal Medicine | Admitting: Internal Medicine

## 2022-05-31 DIAGNOSIS — I110 Hypertensive heart disease with heart failure: Secondary | ICD-10-CM | POA: Diagnosis not present

## 2022-05-31 DIAGNOSIS — I509 Heart failure, unspecified: Secondary | ICD-10-CM

## 2022-05-31 DIAGNOSIS — N281 Cyst of kidney, acquired: Secondary | ICD-10-CM | POA: Diagnosis not present

## 2022-05-31 DIAGNOSIS — C7972 Secondary malignant neoplasm of left adrenal gland: Secondary | ICD-10-CM | POA: Diagnosis present

## 2022-05-31 DIAGNOSIS — Z7982 Long term (current) use of aspirin: Secondary | ICD-10-CM | POA: Diagnosis not present

## 2022-05-31 DIAGNOSIS — I50811 Acute right heart failure: Secondary | ICD-10-CM

## 2022-05-31 DIAGNOSIS — I739 Peripheral vascular disease, unspecified: Secondary | ICD-10-CM | POA: Diagnosis present

## 2022-05-31 DIAGNOSIS — D649 Anemia, unspecified: Secondary | ICD-10-CM | POA: Diagnosis present

## 2022-05-31 DIAGNOSIS — R600 Localized edema: Secondary | ICD-10-CM | POA: Diagnosis not present

## 2022-05-31 DIAGNOSIS — D75838 Other thrombocytosis: Secondary | ICD-10-CM | POA: Diagnosis present

## 2022-05-31 DIAGNOSIS — Z7969 Long term (current) use of other immunomodulators and immunosuppressants: Secondary | ICD-10-CM | POA: Diagnosis not present

## 2022-05-31 DIAGNOSIS — E7800 Pure hypercholesterolemia, unspecified: Secondary | ICD-10-CM | POA: Diagnosis not present

## 2022-05-31 DIAGNOSIS — I5020 Unspecified systolic (congestive) heart failure: Secondary | ICD-10-CM | POA: Diagnosis present

## 2022-05-31 DIAGNOSIS — J90 Pleural effusion, not elsewhere classified: Secondary | ICD-10-CM

## 2022-05-31 DIAGNOSIS — M549 Dorsalgia, unspecified: Secondary | ICD-10-CM | POA: Diagnosis not present

## 2022-05-31 DIAGNOSIS — I2699 Other pulmonary embolism without acute cor pulmonale: Secondary | ICD-10-CM | POA: Diagnosis not present

## 2022-05-31 DIAGNOSIS — I5023 Acute on chronic systolic (congestive) heart failure: Secondary | ICD-10-CM | POA: Diagnosis not present

## 2022-05-31 DIAGNOSIS — R109 Unspecified abdominal pain: Secondary | ICD-10-CM | POA: Diagnosis not present

## 2022-05-31 DIAGNOSIS — R7989 Other specified abnormal findings of blood chemistry: Secondary | ICD-10-CM | POA: Diagnosis not present

## 2022-05-31 DIAGNOSIS — Z7952 Long term (current) use of systemic steroids: Secondary | ICD-10-CM

## 2022-05-31 DIAGNOSIS — G8929 Other chronic pain: Secondary | ICD-10-CM | POA: Diagnosis present

## 2022-05-31 DIAGNOSIS — M899 Disorder of bone, unspecified: Secondary | ICD-10-CM | POA: Diagnosis present

## 2022-05-31 DIAGNOSIS — Z833 Family history of diabetes mellitus: Secondary | ICD-10-CM

## 2022-05-31 DIAGNOSIS — R9431 Abnormal electrocardiogram [ECG] [EKG]: Secondary | ICD-10-CM | POA: Diagnosis not present

## 2022-05-31 DIAGNOSIS — J9811 Atelectasis: Secondary | ICD-10-CM | POA: Diagnosis present

## 2022-05-31 DIAGNOSIS — M5136 Other intervertebral disc degeneration, lumbar region: Secondary | ICD-10-CM | POA: Diagnosis not present

## 2022-05-31 DIAGNOSIS — E876 Hypokalemia: Secondary | ICD-10-CM | POA: Diagnosis present

## 2022-05-31 DIAGNOSIS — R0602 Shortness of breath: Secondary | ICD-10-CM | POA: Diagnosis not present

## 2022-05-31 DIAGNOSIS — M545 Low back pain, unspecified: Secondary | ICD-10-CM | POA: Diagnosis not present

## 2022-05-31 DIAGNOSIS — I5031 Acute diastolic (congestive) heart failure: Secondary | ICD-10-CM | POA: Diagnosis not present

## 2022-05-31 DIAGNOSIS — Z87891 Personal history of nicotine dependence: Secondary | ICD-10-CM

## 2022-05-31 DIAGNOSIS — K5730 Diverticulosis of large intestine without perforation or abscess without bleeding: Secondary | ICD-10-CM | POA: Diagnosis present

## 2022-05-31 DIAGNOSIS — D471 Chronic myeloproliferative disease: Secondary | ICD-10-CM | POA: Diagnosis not present

## 2022-05-31 DIAGNOSIS — Z7961 Long term (current) use of immunomodulator: Secondary | ICD-10-CM | POA: Diagnosis not present

## 2022-05-31 DIAGNOSIS — N179 Acute kidney failure, unspecified: Secondary | ICD-10-CM | POA: Diagnosis not present

## 2022-05-31 DIAGNOSIS — D702 Other drug-induced agranulocytosis: Secondary | ICD-10-CM | POA: Diagnosis not present

## 2022-05-31 DIAGNOSIS — E278 Other specified disorders of adrenal gland: Secondary | ICD-10-CM

## 2022-05-31 DIAGNOSIS — I429 Cardiomyopathy, unspecified: Secondary | ICD-10-CM | POA: Diagnosis not present

## 2022-05-31 DIAGNOSIS — Z880 Allergy status to penicillin: Secondary | ICD-10-CM

## 2022-05-31 DIAGNOSIS — I5021 Acute systolic (congestive) heart failure: Secondary | ICD-10-CM | POA: Diagnosis not present

## 2022-05-31 DIAGNOSIS — Z9081 Acquired absence of spleen: Secondary | ICD-10-CM

## 2022-05-31 DIAGNOSIS — I5022 Chronic systolic (congestive) heart failure: Secondary | ICD-10-CM | POA: Diagnosis not present

## 2022-05-31 DIAGNOSIS — Z66 Do not resuscitate: Secondary | ICD-10-CM | POA: Diagnosis present

## 2022-05-31 DIAGNOSIS — I214 Non-ST elevation (NSTEMI) myocardial infarction: Secondary | ICD-10-CM | POA: Diagnosis not present

## 2022-05-31 DIAGNOSIS — Z79899 Other long term (current) drug therapy: Secondary | ICD-10-CM | POA: Diagnosis not present

## 2022-05-31 DIAGNOSIS — C9000 Multiple myeloma not having achieved remission: Secondary | ICD-10-CM | POA: Diagnosis present

## 2022-05-31 DIAGNOSIS — I10 Essential (primary) hypertension: Secondary | ICD-10-CM | POA: Diagnosis present

## 2022-05-31 DIAGNOSIS — E279 Disorder of adrenal gland, unspecified: Secondary | ICD-10-CM | POA: Diagnosis not present

## 2022-05-31 LAB — URINALYSIS, ROUTINE W REFLEX MICROSCOPIC
Bilirubin Urine: NEGATIVE
Glucose, UA: NEGATIVE mg/dL
Hgb urine dipstick: NEGATIVE
Ketones, ur: NEGATIVE mg/dL
Leukocytes,Ua: NEGATIVE
Nitrite: NEGATIVE
Protein, ur: NEGATIVE mg/dL
Specific Gravity, Urine: 1.021 (ref 1.005–1.030)
pH: 5 (ref 5.0–8.0)

## 2022-05-31 LAB — COMPREHENSIVE METABOLIC PANEL WITH GFR
ALT: 9 U/L (ref 0–44)
AST: 8 U/L — ABNORMAL LOW (ref 15–41)
Albumin: 2.8 g/dL — ABNORMAL LOW (ref 3.5–5.0)
Alkaline Phosphatase: 51 U/L (ref 38–126)
Anion gap: 6 (ref 5–15)
BUN: 13 mg/dL (ref 8–23)
CO2: 24 mmol/L (ref 22–32)
Calcium: 8.5 mg/dL — ABNORMAL LOW (ref 8.9–10.3)
Chloride: 110 mmol/L (ref 98–111)
Creatinine, Ser: 0.79 mg/dL (ref 0.44–1.00)
GFR, Estimated: >60 mL/min
Glucose, Bld: 87 mg/dL (ref 70–99)
Potassium: 3.5 mmol/L (ref 3.5–5.1)
Sodium: 140 mmol/L (ref 135–145)
Total Bilirubin: 0.5 mg/dL (ref 0.3–1.2)
Total Protein: 5.1 g/dL — ABNORMAL LOW (ref 6.5–8.1)

## 2022-05-31 LAB — LIPASE, BLOOD: Lipase: 28 U/L (ref 11–51)

## 2022-05-31 LAB — CBC
HCT: 30.4 % — ABNORMAL LOW (ref 36.0–46.0)
Hemoglobin: 9.7 g/dL — ABNORMAL LOW (ref 12.0–15.0)
MCH: 28.4 pg (ref 26.0–34.0)
MCHC: 31.9 g/dL (ref 30.0–36.0)
MCV: 89.1 fL (ref 80.0–100.0)
Platelets: 402 10*3/uL — ABNORMAL HIGH (ref 150–400)
RBC: 3.41 MIL/uL — ABNORMAL LOW (ref 3.87–5.11)
RDW: 20.4 % — ABNORMAL HIGH (ref 11.5–15.5)
WBC: 5.8 10*3/uL (ref 4.0–10.5)
nRBC: 0.3 % — ABNORMAL HIGH (ref 0.0–0.2)

## 2022-05-31 LAB — BRAIN NATRIURETIC PEPTIDE: B Natriuretic Peptide: 1070 pg/mL — ABNORMAL HIGH (ref 0.0–100.0)

## 2022-05-31 MED ORDER — SODIUM CHLORIDE 0.9% FLUSH
3.0000 mL | Freq: Two times a day (BID) | INTRAVENOUS | Status: DC
  Administered 2022-05-31 – 2022-06-04 (×8): 3 mL via INTRAVENOUS

## 2022-05-31 MED ORDER — IOHEXOL 300 MG/ML  SOLN
100.0000 mL | Freq: Once | INTRAMUSCULAR | Status: AC | PRN
  Administered 2022-05-31: 100 mL via INTRAVENOUS

## 2022-05-31 MED ORDER — ATORVASTATIN CALCIUM 40 MG PO TABS
40.0000 mg | ORAL_TABLET | Freq: Every day | ORAL | Status: DC
  Administered 2022-05-31 – 2022-06-04 (×5): 40 mg via ORAL
  Filled 2022-05-31 (×5): qty 1

## 2022-05-31 MED ORDER — ENOXAPARIN SODIUM 40 MG/0.4ML IJ SOSY
40.0000 mg | PREFILLED_SYRINGE | INTRAMUSCULAR | Status: DC
  Administered 2022-05-31 – 2022-06-02 (×3): 40 mg via SUBCUTANEOUS
  Filled 2022-05-31 (×3): qty 0.4

## 2022-05-31 MED ORDER — ACETAMINOPHEN 325 MG PO TABS
650.0000 mg | ORAL_TABLET | ORAL | Status: DC | PRN
  Administered 2022-06-03: 650 mg via ORAL
  Filled 2022-05-31: qty 2

## 2022-05-31 MED ORDER — ONDANSETRON HCL 4 MG/2ML IJ SOLN: 4.0000 mg | Freq: Four times a day (QID) | INTRAMUSCULAR | Status: DC | PRN

## 2022-05-31 MED ORDER — FUROSEMIDE 10 MG/ML IJ SOLN
20.0000 mg | Freq: Once | INTRAMUSCULAR | Status: AC
  Administered 2022-05-31: 20 mg via INTRAVENOUS
  Filled 2022-05-31: qty 2

## 2022-05-31 MED ORDER — MELATONIN 3 MG PO TABS
6.0000 mg | ORAL_TABLET | Freq: Once | ORAL | Status: AC
  Administered 2022-05-31: 6 mg via ORAL
  Filled 2022-05-31: qty 2

## 2022-05-31 MED ORDER — FUROSEMIDE 10 MG/ML IJ SOLN
40.0000 mg | Freq: Two times a day (BID) | INTRAMUSCULAR | Status: AC
  Administered 2022-06-01 – 2022-06-03 (×5): 40 mg via INTRAVENOUS
  Filled 2022-05-31 (×5): qty 4

## 2022-05-31 MED ORDER — SODIUM CHLORIDE 0.9% FLUSH: 3.0000 mL | INTRAVENOUS | Status: DC | PRN

## 2022-05-31 MED ORDER — KETOROLAC TROMETHAMINE 10 MG PO TABS
10.0000 mg | ORAL_TABLET | Freq: Four times a day (QID) | ORAL | Status: DC | PRN
  Administered 2022-06-02: 10 mg via ORAL
  Filled 2022-05-31: qty 1

## 2022-05-31 MED ORDER — OXYCODONE-ACETAMINOPHEN 5-325 MG PO TABS
1.0000 | ORAL_TABLET | Freq: Four times a day (QID) | ORAL | Status: DC | PRN
  Administered 2022-06-01: 1 via ORAL
  Filled 2022-05-31: qty 1

## 2022-05-31 MED ORDER — ACYCLOVIR 800 MG PO TABS
400.0000 mg | ORAL_TABLET | Freq: Two times a day (BID) | ORAL | Status: DC
  Administered 2022-05-31 – 2022-06-04 (×8): 400 mg via ORAL
  Filled 2022-05-31 (×8): qty 1

## 2022-05-31 MED ORDER — ASPIRIN 81 MG PO TBEC
81.0000 mg | DELAYED_RELEASE_TABLET | Freq: Every day | ORAL | Status: DC
  Administered 2022-05-31 – 2022-06-04 (×5): 81 mg via ORAL
  Filled 2022-05-31 (×5): qty 1

## 2022-05-31 MED ORDER — SODIUM CHLORIDE 0.9 % IV SOLN: 250.0000 mL | INTRAVENOUS | Status: DC | PRN

## 2022-05-31 MED ORDER — FUROSEMIDE 10 MG/ML IJ SOLN
40.0000 mg | Freq: Once | INTRAMUSCULAR | Status: AC
  Administered 2022-05-31: 40 mg via INTRAVENOUS
  Filled 2022-05-31: qty 4

## 2022-05-31 MED ORDER — AMLODIPINE BESYLATE 5 MG PO TABS
5.0000 mg | ORAL_TABLET | Freq: Every day | ORAL | Status: DC
  Administered 2022-05-31 – 2022-06-01 (×2): 5 mg via ORAL
  Filled 2022-05-31 (×3): qty 1

## 2022-05-31 MED ORDER — HYDROCODONE-ACETAMINOPHEN 5-325 MG PO TABS
1.0000 | ORAL_TABLET | Freq: Once | ORAL | Status: AC
  Administered 2022-05-31: 1 via ORAL
  Filled 2022-05-31: qty 1

## 2022-05-31 NOTE — H&P (Signed)
History and Physical    Leslie Williamson:[id number] DOB: 1931/02/28 DOA: 05/31/2022  PCP: Benetta Spar, MD  Patient coming from: Home  I have personally briefly reviewed patient's old medical records in [facility]  Chief Complaint: Back pain  HPI: Leslie Williamson is a 89 y.o. female with medical history significant of multiple myeloma, hypertension, hyperlipidemia to ED with a complaint of back pain.  According the patient, the pain started about a month ago, it is located at lower lumbar spine and radiates to bilateral hip.  No aggravating or relieving factor.  Pain had gotten worse so she came to the ED.  She did have 1 episode of nocturnal dyspnea 2 nights ago which resolved after she sat up and lasted only for few minutes.  She denies any saddle anesthesia, bowel or bladder incontinence.  Or any weakness in bilateral lower extremities.  ED Course: Upon arrival to ED, she was hemodynamically stable.  CMP within normal limits.  Mild anemia with hemoglobin at her baseline.  Was found to have elevated BNP of 1070 (baseline unknown) with clinical findings of CHF due to edema, she was given Lasix 20 mg IV and hospital service were consulted for admission.  For her back pain, she underwent CT lumbar spine and abdomen pelvis and was found to have only lucent lesion at T11 but mostly degenerative advanced multilevel disease.  Review of Systems: As per HPI otherwise negative.    Past Medical History:  Diagnosis Date   Anxiety    Complication of anesthesia    difficult to wake up   Depression    Hypercholesteremia    Hypertension    Hypokalemia    Iron deficiency anemia    Left hip pain    NHL (non-Hodgkin's lymphoma) 12/17/2010   Obesity    Primary thrombocytosis 01/11/2006   Secondary to splenectomy     PVD (peripheral vascular disease)    S/P splenectomy 01/01/2014   Small cell B-cell lymphoma of spleen    richter's transformation to large cell high grade B-cell  lymphoma   Vertigo, labyrinthine     Past Surgical History:  Procedure Laterality Date   CATARACT EXTRACTION W/PHACO Left 11/03/2015   Procedure: CATARACT EXTRACTION PHACO AND INTRAOCULAR LENS PLACEMENT (IOC);  Surgeon: Gemma Payor, MD;  Location: [facility];  Service: Ophthalmology;  Laterality: Left;  CDE: 7.74   CATARACT EXTRACTION W/PHACO Right 11/20/2015   Procedure: CATARACT EXTRACTION PHACO AND INTRAOCULAR LENS PLACEMENT ; CDE:  8.30;  Surgeon: Gemma Payor, MD;  Location: [facility];  Service: Ophthalmology;  Laterality: Right;   IR IMAGING GUIDED PORT INSERTION  11/04/2021   PORT-A-CATH REMOVAL       reports that she has quit smoking. She has never used smokeless tobacco. She reports that she does not drink alcohol and does not use drugs.  Allergies  Allergen Reactions   Penicillins Rash    Family History  Problem Relation Age of Onset   Diabetes Mother     Prior to Admission medications   Medication Sig Start Date End Date Taking? Authorizing Provider  acyclovir (ZOVIRAX) 400 MG tablet Take 1 tablet (400 mg total) by mouth 2 (two) times daily. 12/30/21  Yes Doreatha Massed, MD  amLODipine (NORVASC) 5 MG tablet Take 1 tablet (5 mg total) by mouth daily. 08/07/21  Yes Tat, Onalee Hua, MD  aspirin 81 MG tablet Take 81 mg by mouth daily.   Yes [provider]  atorvastatin (LIPITOR) 40 MG tablet Take 40  mg by mouth daily. 01/07/15  Yes [provider]  dexamethasone (DECADRON) 4 MG tablet Take 20 mg (5 tablets) by mouth weekly every Wednesday morning 03/24/22  Yes Doreatha Massed, MD  furosemide (LASIX) 20 MG tablet Take 20 mg by mouth daily as needed. 05/10/22  Yes [provider]  ketorolac (TORADOL) 10 MG tablet Take 1 tablet (10 mg total) by mouth every 6 (six) hours as needed. 03/24/22  Yes Doreatha Massed, MD  lenalidomide (REVLIMID) 15 MG capsule Take 1 capsule (15 mg total) by mouth daily. Take for 21 days on, 7 days off. 05/14/22  Yes Doreatha Massed, MD  potassium chloride SA (KLOR-CON M) 20 MEQ tablet Take 2 tablets (40 mEq total) by mouth daily. 01/06/22  Yes Doreatha Massed, MD  baclofen (LIORESAL) 10 MG tablet Take 10 mg by mouth 2 (two) times daily. Patient not taking: Reported on 05/31/2022 02/19/22   [provider]  busPIRone (BUSPAR) 5 MG tablet Take 5 mg by mouth at bedtime as needed (anxiety/sleep). Patient not taking: Reported on 05/31/2022    [provider]  cilostazol (PLETAL) 100 MG tablet Take 100 mg by mouth 2 (two) times daily. Patient not taking: Reported on 05/31/2022 09/03/21   [provider]  ferrous sulfate 325 (65 FE) MG EC tablet Take 1 tablet (325 mg total) by mouth daily with breakfast. Patient not taking: Reported on 05/31/2022 04/08/20   Mauro Kaufmann, NP  lidocaine-prilocaine (EMLA) cream Apply 1 Application topically as needed. Patient not taking: Reported on 05/31/2022 11/05/21   Georga Kaufmann T, PA-C  prochlorperazine (COMPAZINE) 5 MG tablet Take 1 tablet (5 mg total) by mouth every 6 (six) hours as needed for nausea or vomiting. Patient not taking: Reported on 05/31/2022 11/25/21   Doreatha Massed, MD  tiZANidine (ZANAFLEX) 2 MG tablet Take 2 mg by mouth 2 (two) times daily. Patient not taking: Reported on 05/31/2022 02/10/22   [provider]  traMADol (ULTRAM) 50 MG tablet Take 1 tablet (50 mg total) by mouth every 12 (twelve) hours as needed. Patient not taking: Reported on 05/31/2022 02/27/22   Leath-Warren, Sadie Haber, NP  traZODone (DESYREL) 50 MG tablet Take 50 mg by mouth at bedtime as needed. Patient not taking: Reported on 05/31/2022 11/23/21   [provider]    Physical Exam: Vitals:   05/31/22 1600 05/31/22 1615 05/31/22 1630 05/31/22 1700  BP:  134/60 (!) 150/76 (!) 144/66  Pulse: 65 (!) 57 75 61  Resp: 17 16 16 16   Temp:      TempSrc:      SpO2: 94% 94% 91% 94%  Weight:      Height:        Constitutional: NAD, calm,  comfortable Vitals:   05/31/22 1600 05/31/22 1615 05/31/22 1630 05/31/22 1700  BP:  134/60 (!) 150/76 (!) 144/66  Pulse: 65 (!) 57 75 61  Resp: 17 16 16 16   Temp:      TempSrc:      SpO2: 94% 94% 91% 94%  Weight:      Height:       Eyes: PERRL, lids and conjunctivae normal ENMT: Mucous membranes are moist. Posterior pharynx clear of any exudate or lesions.Normal dentition.  Neck: normal, supple, no masses, no thyromegaly Respiratory: clear to auscultation bilaterally, no wheezing, no crackles. Normal respiratory effort. No accessory muscle use.  Cardiovascular: Regular rate and rhythm, no murmurs / rubs / gallops.  +3 pitting edema bilateral lower extremity. 2+ pedal pulses. No  carotid bruits.  Abdomen: no tenderness, no masses palpated. No hepatosplenomegaly. Bowel sounds positive.  Musculoskeletal: no clubbing / cyanosis. No joint deformity upper and lower extremities. Good ROM, no contractures. Normal muscle tone.  Skin: no rashes, lesions, ulcers. No induration Neurologic: CN 2-12 grossly intact. Sensation intact, DTR normal. Strength 5/5 in all 4.  Psychiatric: Normal judgment and insight. Alert and oriented x 3. Normal mood.    Labs on Admission: I have personally reviewed following labs and imaging studies  CBC: Recent Labs  Lab 05/31/22 1153  WBC 5.8  HGB 9.7*  HCT 30.4*  MCV 89.1  PLT 402*   Basic Metabolic Panel: Recent Labs  Lab 05/31/22 1153  NA 140  K 3.5  CL 110  CO2 24  GLUCOSE 87  BUN 13  CREATININE 0.79  CALCIUM 8.5*   GFR: Estimated Creatinine Clearance: 41.7 mL/min (by C-G formula based on SCr of 0.79 mg/dL). Liver Function Tests: Recent Labs  Lab 05/31/22 1153  AST 8*  ALT 9  ALKPHOS 51  BILITOT 0.5  PROT 5.1*  ALBUMIN 2.8*   Recent Labs  Lab 05/31/22 1153  LIPASE 28   No results for input(s): "AMMONIA" in the last 168 hours. Coagulation Profile: No results for input(s): "INR", "PROTIME" in the last 168 hours. Cardiac  Enzymes: No results for input(s): "CKTOTAL", "CKMB", "CKMBINDEX", "TROPONINI" in the last 168 hours. BNP (last 3 results) No results for input(s): "PROBNP" in the last 8760 hours. HbA1C: No results for input(s): "HGBA1C" in the last 72 hours. CBG: No results for input(s): "GLUCAP" in the last 168 hours. Lipid Profile: No results for input(s): "CHOL", "HDL", "LDLCALC", "TRIG", "CHOLHDL", "LDLDIRECT" in the last 72 hours. Thyroid Function Tests: No results for input(s): "TSH", "T4TOTAL", "FREET4", "T3FREE", "THYROIDAB" in the last 72 hours. Anemia Panel: No results for input(s): "VITAMINB12", "FOLATE", "FERRITIN", "TIBC", "IRON", "RETICCTPCT" in the last 72 hours. Urine analysis:    Component Value Date/Time   COLORURINE YELLOW 08/05/2021 1900   APPEARANCEUR CLEAR 08/05/2021 1900   LABSPEC 1.016 08/05/2021 1900   PHURINE 5.0 08/05/2021 1900   GLUCOSEU NEGATIVE 08/05/2021 1900   HGBUR MODERATE (A) 08/05/2021 1900   HGBUR large 06/19/2007 0845   BILIRUBINUR NEGATIVE 08/05/2021 1900   BILIRUBINUR negative 08/05/2021 1334   KETONESUR NEGATIVE 08/05/2021 1900   PROTEINUR NEGATIVE 08/05/2021 1900   UROBILINOGEN 0.2 08/05/2021 1334   UROBILINOGEN 0.2 07/03/2012 1929   NITRITE NEGATIVE 08/05/2021 1900   LEUKOCYTESUR NEGATIVE 08/05/2021 1900    Radiological Exams on Admission: CT L-SPINE NO CHARGE  Result Date: 05/31/2022 CLINICAL DATA:  Back pain radiating to the sides EXAM: CT Lumbar Spine without contrast TECHNIQUE: Technique: Multiplanar CT images of the lumbar spine were reconstructed from contemporary CT of the Abdomen and Pelvis. RADIATION DOSE REDUCTION: This exam was performed according to the departmental dose-optimization program which includes automated exposure control, adjustment of the mA and/or kV according to patient size and/or use of iterative reconstruction technique. CONTRAST:  None or No additional COMPARISON:  PET CT 11/05/2021, MRI 07/07/2012 FINDINGS: Segmentation: 5  lumbar type vertebrae. Alignment: 6 mm anterolisthesis L3 on L4. Vertebrae: No acute fracture or focal pathologic process. Lucent lesion at T11. Paraspinal and other soft tissues: See separately dictated CT abdomen pelvis. Disc levels: At T12-L1, maintained disc space. No canal stenosis. The foramen are patent. At L1-L2, patent disc space. No canal stenosis. The foramen are patent. At L2-L3, shallow left paracentral disc protrusion. No high-grade canal stenosis. Moderate facet degenerative changes. No bony  foraminal narrowing. At L3-L4, moderate disc space narrowing with vacuum disc. At least moderate canal stenosis. Advanced hypertrophic facet degenerative changes. Bilateral foraminal narrowing. At L4-L5, advanced disc space narrowing with endplate sclerosis. Posterior disc osteophyte complex with at least mild canal stenosis. Advanced bilateral facet arthropathy. Bilateral foraminal narrowing. At L5-S1, mild disc space narrowing. No canal stenosis. Advanced facet degenerative changes. No foraminal narrowing. IMPRESSION: 1. No CT evidence for acute osseous abnormality. 2. Grade 1 anterolisthesis L3 on L4 with advanced multilevel degenerative changes, worst at at L3-L4 and L4-L5. Advanced facet degenerative changes at multiple levels. 3. Lucent lesion at T11 potentially related to history of myeloma Electronically Signed   By: Jasmine Pang M.D.   On: 05/31/2022 15:41   CT Abdomen Pelvis W Contrast  Result Date: 05/31/2022 CLINICAL DATA:  Low back pain radiating to the sides EXAM: CT ABDOMEN AND PELVIS WITH CONTRAST TECHNIQUE: Multidetector CT imaging of the abdomen and pelvis was performed using the standard protocol following bolus administration of intravenous contrast. RADIATION DOSE REDUCTION: This exam was performed according to the departmental dose-optimization program which includes automated exposure control, adjustment of the mA and/or kV according to patient size and/or use of iterative reconstruction  technique. CONTRAST:  OMNIPAQUE IOHEXOL 300 MG/ML  SOLN COMPARISON:  PET CT 11/05/2021, CT chest 08/05/2021, CT abdomen pelvis 07/04/2012 FINDINGS: Lower chest: Lung bases demonstrate cardiomegaly. Partially visualized central venous catheter tip at the cavoatrial region. Small bilateral pleural effusions. Passive atelectasis at the bases. Hepatobiliary: Slightly heterogeneous liver enhancement without focal mass. Status post cholecystectomy. No biliary dilatation. Pancreas: Unremarkable. No pancreatic ductal dilatation or surrounding inflammatory changes. Spleen: Splenectomy Adrenals/Urinary Tract: Right adrenal gland is normal. 11 mm nodule in the left adrenal gland, series 2, image 17. Kidneys show no hydronephrosis. Bilateral renal cysts and subcentimeter hypodensities too small to further characterize, no imaging follow-up is recommended. Urinary bladder is unremarkable Stomach/Bowel: Stomach nonenlarged. No dilated small bowel. No acute bowel wall thickening. Diffuse diverticular disease of the colon without acute wall thickening. Vascular/Lymphatic: Advanced aortic atherosclerosis. No aneurysm. No suspicious lymph nodes. Reproductive: Status post hysterectomy. No adnexal masses. Other: No free air. Small volume free fluid in the pelvis. Water density collection adjacent to the inferior margin of the liver measures 4.7 x 3.5 cm, not metabolically active on recent PET CT. Musculoskeletal: No acute osseous abnormality. Lucent lesion at T12 vertebral body, not convincingly seen on previous exams. IMPRESSION: 1. No CT evidence for acute intra-abdominal or pelvic abnormality. 2. Cardiomegaly with small bilateral pleural effusions. Small volume free fluid in the pelvis. 3. Diffuse diverticular disease of the colon without acute wall thickening. 4. Lucent lesion at T12 vertebral body, not convincingly seen on previous exams, and potentially related to the history of myeloma. 5. 11 mm indeterminate left adrenal  nodule, not definitively seen on prior, raising concern for metastatic disease. 6. Aortic atherosclerosis. Aortic Atherosclerosis (ICD10-I70.0). Electronically Signed   By: Jasmine Pang M.D.   On: 05/31/2022 15:30   DG Chest Portable 1 View  Result Date: 05/31/2022 CLINICAL DATA:  Shortness of breath. EXAM: PORTABLE CHEST 1 VIEW COMPARISON:  August 05, 2021. FINDINGS: Stable cardiomegaly. Mild left pleural effusion is noted with associated left basilar atelectasis or infiltrate. Minimal right basilar subsegmental atelectasis is noted. Right internal jugular Port-A-Cath is noted with distal tip in expected position of the SVC. Bony thorax is unremarkable. IMPRESSION: Mild left pleural effusion with associated left basilar atelectasis or infiltrate. Electronically Signed   By: Lupita Raider  M.D.   On: 05/31/2022 14:48    EKG: Independently reviewed.  Sinus rhythm with left atrial enlargement.  Assessment/Plan Principal Problem:   CHF (congestive heart failure) Active Problems:   JAK2+ MDP (myeloproliferative disorder) presenting with thrombocytosis (HCC)   Essential hypertension   Multiple myeloma   Acute on chronic back pain: Back pain going on since about a month.  Imaging study shows advanced multilevel degenerative disc disease.  Will resume her home dose of Toradol.  Will consult PT OT.  Also place her on Percocet.  Possible acute on chronic diastolic heart failure: She has +3 pitting edema bilateral lower extremity and 1 episode of nocturnal dyspnea 2 nights ago.  Previously echo was done in 2017 which showed normal ejection fraction.  She is on chemotherapy, she can very well have congestive heart failure.  Lungs are clear to auscultation and chest x-ray shows only cardiomegaly.  She has received 20 mg IV Lasix, I will give her 20 mg more and starting tomorrow, I will start her on 40 mg IV twice daily.  Repeat echo with a strict I's and O's, low-sodium diet and daily weight.  Multiple  myeloma: ED physician discussed case with patient's primary oncologist Dr. Ellin SabaKatragadda who reviewed patient's chart and opined that patient's multiple myeloma is fairly controlled and her current condition has nothing to do with that and she does not need to be seen by oncology.  Resume her home medications.  Essential hypertension: Fairly controlled.  Resume amlodipine.  Hyperlipidemia: Continue atorvastatin.  DVT prophylaxis: enoxaparin (LOVENOX) injection 40 mg Start: 05/31/22 1800 Code Status: DNR Family Communication: Daughter present at bedside.  Plan of care discussed with patient in length and he verbalized understanding and agreed with it. Disposition Plan: Potential discharge in next 1 to 2 days. Consults called: None  Hughie Clossavi Persis Graffius MD [facility]  *Please note that this is a verbal dictation therefore any spelling or grammatical errors are due to the "[employer] One" system interpretation.  Please page via Amion and do not message via secure chat for urgent patient care matters. Secure chat can be used for non urgent patient care matters. 05/31/2022, 5:23 PM  To contact the attending provider between 7A-7P or the covering provider during after hours 7P-7A, please log into the web site [email or web address]

## 2022-05-31 NOTE — Telephone Encounter (Signed)
Son called to advise that patient is having severe pain in lower back and hips to the point that she is having profound difficulty walking.  Advised to go to the ER for evaluation.  Verbalized understanding.

## 2022-05-31 NOTE — ED Provider Notes (Signed)
Leslie Williamson Provider Note   CSN: [id number] Arrival date & time: 05/31/22  1056     History  Chief Complaint  Patient presents with   Back Pain   HPI Leslie Williamson is a 89 y.o. female with multiple myeloma presenting for back pain.  Started about a month ago.  Followed by her lower back and extends into her left and right hip.  Pain feels sharp.  Denies any trauma.  Denies saddle anesthesia, fever, urinary and bowel incontinence.  Denies lower extremity numbness and weakness. States she is also noticed some swelling in her legs.  This occurred about a month ago as well.  States she is actively receiving chemo and radiation for her multiple myeloma.  Denies chest pain states she was short of breath Saturday night.  Took Tylenol yesterday for pain.   Back Pain      Home Medications Prior to Admission medications   Medication Sig Start Date End Date Taking? Authorizing Provider  acyclovir (ZOVIRAX) 400 MG tablet Take 1 tablet (400 mg total) by mouth 2 (two) times daily. 12/30/21  Yes Doreatha Massed, MD  amLODipine (NORVASC) 5 MG tablet Take 1 tablet (5 mg total) by mouth daily. 08/07/21  Yes Tat, Onalee Hua, MD  aspirin 81 MG tablet Take 81 mg by mouth daily.   Yes [provider]  atorvastatin (LIPITOR) 40 MG tablet Take 40 mg by mouth daily. 01/07/15  Yes [provider]  dexamethasone (DECADRON) 4 MG tablet Take 20 mg (5 tablets) by mouth weekly every Wednesday morning 03/24/22  Yes Doreatha Massed, MD  furosemide (LASIX) 20 MG tablet Take 20 mg by mouth daily as needed. 05/10/22  Yes [provider]  ketorolac (TORADOL) 10 MG tablet Take 1 tablet (10 mg total) by mouth every 6 (six) hours as needed. 03/24/22  Yes Doreatha Massed, MD  lenalidomide (REVLIMID) 15 MG capsule Take 1 capsule (15 mg total) by mouth daily. Take for 21 days on, 7 days off. 05/14/22  Yes Doreatha Massed, MD  potassium  chloride SA (KLOR-CON M) 20 MEQ tablet Take 2 tablets (40 mEq total) by mouth daily. 01/06/22  Yes Doreatha Massed, MD  baclofen (LIORESAL) 10 MG tablet Take 10 mg by mouth 2 (two) times daily. Patient not taking: Reported on 05/31/2022 02/19/22   [provider]  busPIRone (BUSPAR) 5 MG tablet Take 5 mg by mouth at bedtime as needed (anxiety/sleep). Patient not taking: Reported on 05/31/2022    [provider]  cilostazol (PLETAL) 100 MG tablet Take 100 mg by mouth 2 (two) times daily. Patient not taking: Reported on 05/31/2022 09/03/21   [provider]  ferrous sulfate 325 (65 FE) MG EC tablet Take 1 tablet (325 mg total) by mouth daily with breakfast. Patient not taking: Reported on 05/31/2022 04/08/20   Mauro Kaufmann, NP  lidocaine-prilocaine (EMLA) cream Apply 1 Application topically as needed. Patient not taking: Reported on 05/31/2022 11/05/21   Georga Kaufmann T, PA-C  prochlorperazine (COMPAZINE) 5 MG tablet Take 1 tablet (5 mg total) by mouth every 6 (six) hours as needed for nausea or vomiting. Patient not taking: Reported on 05/31/2022 11/25/21   Doreatha Massed, MD  tiZANidine (ZANAFLEX) 2 MG tablet Take 2 mg by mouth 2 (two) times daily. Patient not taking: Reported on 05/31/2022 02/10/22   [provider]  traMADol (ULTRAM) 50 MG tablet Take 1 tablet (50 mg total) by mouth every 12 (twelve) hours as needed. Patient  not taking: Reported on 05/31/2022 02/27/22   Leath-Warren, Sadie Haber, NP  traZODone (DESYREL) 50 MG tablet Take 50 mg by mouth at bedtime as needed. Patient not taking: Reported on 05/31/2022 11/23/21   [provider]      Allergies    Penicillins    Review of Systems   Review of Systems  Musculoskeletal:  Positive for back pain.    Physical Exam   Vitals:   05/31/22 1700 05/31/22 1730  BP: (!) 144/66 (!) 140/60  Pulse: 61 (!) 55  Resp: 16 16  Temp:    SpO2: 94% 94%    CONSTITUTIONAL:  well-appearing, NAD NEURO:   Alert and oriented x 3, CN 3-12 grossly intact EYES:  eyes equal and reactive ENT/NECK:  Supple, no stridor  CARDIO:  bradycardic, regular rhythm, appears well-perfused  PULM:  No respiratory distress, CTAB GI/GU:  non-distended, soft, non tender MSK/SPINE: 2+ pitting edema noted around her ankles extending up to her calves, edema noted in both hands, gait is steady, moving all extremities appropriately.  No midline tenderness of the spine.  Passive range of motion of both hips normal.  SKIN:  no rash, atraumatic  *Additional and/or pertinent findings included in MDM below   ED Results / Procedures / Treatments   Labs (all labs ordered are listed, but only abnormal results are displayed) Labs Reviewed  COMPREHENSIVE METABOLIC PANEL - Abnormal; Notable for the following components:      Result Value   Calcium 8.5 (*)    Total Protein 5.1 (*)    Albumin 2.8 (*)    AST 8 (*)    All other components within normal limits  CBC - Abnormal; Notable for the following components:   RBC 3.41 (*)    Hemoglobin 9.7 (*)    HCT 30.4 (*)    RDW 20.4 (*)    Platelets 402 (*)    nRBC 0.3 (*)    All other components within normal limits  URINALYSIS, ROUTINE W REFLEX MICROSCOPIC - Abnormal; Notable for the following components:   Color, Urine STRAW (*)    All other components within normal limits  BRAIN NATRIURETIC PEPTIDE - Abnormal; Notable for the following components:   B Natriuretic Peptide 1,070.0 (*)    All other components within normal limits  LIPASE, BLOOD  BASIC METABOLIC PANEL  MAGNESIUM    EKG None  Radiology CT L-SPINE NO CHARGE  Result Date: 05/31/2022 CLINICAL DATA:  Back pain radiating to the sides EXAM: CT Lumbar Spine without contrast TECHNIQUE: Technique: Multiplanar CT images of the lumbar spine were reconstructed from contemporary CT of the Abdomen and Pelvis. RADIATION DOSE REDUCTION: This exam was performed according to the departmental dose-optimization program  which includes automated exposure control, adjustment of the mA and/or kV according to patient size and/or use of iterative reconstruction technique. CONTRAST:  None or No additional COMPARISON:  PET CT 11/05/2021, MRI 07/07/2012 FINDINGS: Segmentation: 5 lumbar type vertebrae. Alignment: 6 mm anterolisthesis L3 on L4. Vertebrae: No acute fracture or focal pathologic process. Lucent lesion at T11. Paraspinal and other soft tissues: See separately dictated CT abdomen pelvis. Disc levels: At T12-L1, maintained disc space. No canal stenosis. The foramen are patent. At L1-L2, patent disc space. No canal stenosis. The foramen are patent. At L2-L3, shallow left paracentral disc protrusion. No high-grade canal stenosis. Moderate facet degenerative changes. No bony foraminal narrowing. At L3-L4, moderate disc space narrowing with vacuum disc. At least moderate canal stenosis. Advanced hypertrophic facet degenerative changes.  Bilateral foraminal narrowing. At L4-L5, advanced disc space narrowing with endplate sclerosis. Posterior disc osteophyte complex with at least mild canal stenosis. Advanced bilateral facet arthropathy. Bilateral foraminal narrowing. At L5-S1, mild disc space narrowing. No canal stenosis. Advanced facet degenerative changes. No foraminal narrowing. IMPRESSION: 1. No CT evidence for acute osseous abnormality. 2. Grade 1 anterolisthesis L3 on L4 with advanced multilevel degenerative changes, worst at at L3-L4 and L4-L5. Advanced facet degenerative changes at multiple levels. 3. Lucent lesion at T11 potentially related to history of myeloma Electronically Signed   By: Jasmine Pang M.D.   On: 05/31/2022 15:41   CT Abdomen Pelvis W Contrast  Result Date: 05/31/2022 CLINICAL DATA:  Low back pain radiating to the sides EXAM: CT ABDOMEN AND PELVIS WITH CONTRAST TECHNIQUE: Multidetector CT imaging of the abdomen and pelvis was performed using the standard protocol following bolus administration of  intravenous contrast. RADIATION DOSE REDUCTION: This exam was performed according to the departmental dose-optimization program which includes automated exposure control, adjustment of the mA and/or kV according to patient size and/or use of iterative reconstruction technique. CONTRAST:  OMNIPAQUE IOHEXOL 300 MG/ML  SOLN COMPARISON:  PET CT 11/05/2021, CT chest 08/05/2021, CT abdomen pelvis 07/04/2012 FINDINGS: Lower chest: Lung bases demonstrate cardiomegaly. Partially visualized central venous catheter tip at the cavoatrial region. Small bilateral pleural effusions. Passive atelectasis at the bases. Hepatobiliary: Slightly heterogeneous liver enhancement without focal mass. Status post cholecystectomy. No biliary dilatation. Pancreas: Unremarkable. No pancreatic ductal dilatation or surrounding inflammatory changes. Spleen: Splenectomy Adrenals/Urinary Tract: Right adrenal gland is normal. 11 mm nodule in the left adrenal gland, series 2, image 17. Kidneys show no hydronephrosis. Bilateral renal cysts and subcentimeter hypodensities too small to further characterize, no imaging follow-up is recommended. Urinary bladder is unremarkable Stomach/Bowel: Stomach nonenlarged. No dilated small bowel. No acute bowel wall thickening. Diffuse diverticular disease of the colon without acute wall thickening. Vascular/Lymphatic: Advanced aortic atherosclerosis. No aneurysm. No suspicious lymph nodes. Reproductive: Status post hysterectomy. No adnexal masses. Other: No free air. Small volume free fluid in the pelvis. Water density collection adjacent to the inferior margin of the liver measures 4.7 x 3.5 cm, not metabolically active on recent PET CT. Musculoskeletal: No acute osseous abnormality. Lucent lesion at T12 vertebral body, not convincingly seen on previous exams. IMPRESSION: 1. No CT evidence for acute intra-abdominal or pelvic abnormality. 2. Cardiomegaly with small bilateral pleural effusions. Small volume  free fluid in the pelvis. 3. Diffuse diverticular disease of the colon without acute wall thickening. 4. Lucent lesion at T12 vertebral body, not convincingly seen on previous exams, and potentially related to the history of myeloma. 5. 11 mm indeterminate left adrenal nodule, not definitively seen on prior, raising concern for metastatic disease. 6. Aortic atherosclerosis. Aortic Atherosclerosis (ICD10-I70.0). Electronically Signed   By: Jasmine Pang M.D.   On: 05/31/2022 15:30   DG Chest Portable 1 View  Result Date: 05/31/2022 CLINICAL DATA:  Shortness of breath. EXAM: PORTABLE CHEST 1 VIEW COMPARISON:  August 05, 2021. FINDINGS: Stable cardiomegaly. Mild left pleural effusion is noted with associated left basilar atelectasis or infiltrate. Minimal right basilar subsegmental atelectasis is noted. Right internal jugular Port-A-Cath is noted with distal tip in expected position of the SVC. Bony thorax is unremarkable. IMPRESSION: Mild left pleural effusion with associated left basilar atelectasis or infiltrate. Electronically Signed   By: Lupita Raider M.D.   On: 05/31/2022 14:48    Procedures Procedures    Medications Ordered in ED Medications  aspirin  EC tablet 81 mg (81 mg Oral Given 05/31/22 1754)  ketorolac (TORADOL) tablet 10 mg (has no administration in time range)  acyclovir (ZOVIRAX) tablet 400 mg (has no administration in time range)  amLODipine (NORVASC) tablet 5 mg (5 mg Oral Given 05/31/22 1754)  atorvastatin (LIPITOR) tablet 40 mg (40 mg Oral Given 05/31/22 1754)  sodium chloride flush (NS) 0.9 % injection 3 mL (has no administration in time range)  sodium chloride flush (NS) 0.9 % injection 3 mL (has no administration in time range)  0.9 %  sodium chloride infusion (has no administration in time range)  acetaminophen (TYLENOL) tablet 650 mg (has no administration in time range)  ondansetron (ZOFRAN) injection 4 mg (has no administration in time range)  enoxaparin (LOVENOX) injection  40 mg (40 mg Subcutaneous Given 05/31/22 1756)  oxyCODONE-acetaminophen (PERCOCET/ROXICET) 5-325 MG per tablet 1 tablet (has no administration in time range)  furosemide (LASIX) injection 40 mg (has no administration in time range)  HYDROcodone-acetaminophen (NORCO/VICODIN) 5-325 MG per tablet 1 tablet (1 tablet Oral Given 05/31/22 1531)  iohexol (OMNIPAQUE) 300 MG/ML solution 100 mL (100 mLs Intravenous Contrast Given 05/31/22 1450)  furosemide (LASIX) injection 20 mg (20 mg Intravenous Given 05/31/22 1630)  furosemide (LASIX) injection 40 mg (40 mg Intravenous Given 05/31/22 1758)    ED Course/ Medical Decision Making/ A&P                             Medical Decision Making Amount and/or Complexity of Data Reviewed Labs: ordered. Radiology: ordered.  Risk Prescription drug management. Decision regarding hospitalization.   Initial Impression and Ddx 89 year old female who is well-appearing presenting for lower back pain.  Exam notable for upper and lower extremity edema.  DDx includes metastasis, CHF exacerbation, ACS, cauda equina syndrome, traumatic back or hip injury. Patient PMH that increases complexity of ED encounter: Multiple myeloma  Interpretation of Diagnostics I independent reviewed and interpreted the labs as followed: Elevated BNP  - I independently visualized the following imaging with scope of interpretation limited to determining acute life threatening conditions related to emergency care: CT abdomen pelvis, which revealed left adrenal nodule, lucent lesion at T12, bilateral pleural effusions and cardiomegaly.  -EKG revealed sinus rhythm  Patient Reassessment and Ultimate Disposition/Management Primary concern initially was metastasis given her ongoing lower back pain.  Discussed CT findings with Dr. Ellin SabaKatragadda who advised that they are likely incidental on has low suspicion for metastasis at this time.  Also mention that he follows her in clinic and that her multiple  myeloma is well-controlled.  Stated that oncology consult during admission is unnecessary at this time.  Lower extremity edema, elevated BNP and bilateral pleural effusions concerning for CHF.  Patient did have an echo in 2017 which revealed EF of 60 to 65%.  Not followed by cardiology.  Admitted to hospital service for CHF exacerbation with Dr. Jacqulyn BathPahwani.   Patient management required discussion with the following services or consulting groups:  Hospitalist Service and Oncology  Complexity of Problems Addressed Acute complicated illness or Injury  Additional Data Reviewed and Analyzed Further history obtained from: Further history from spouse/family member, Past medical history and medications listed in the EMR, Prior ED visit notes, and Recent discharge summary  Patient Encounter Risk Assessment Consideration of hospitalization         Final Clinical Impression(s) / ED Diagnoses Final diagnoses:  Lower extremity edema  Pleural effusion  Adrenal nodule    Rx /  DC Orders ED Discharge Orders     None         Vaughan Browner 05/31/22 1817    Bethann Berkshire, MD 06/02/22 [phone]

## 2022-05-31 NOTE — ED Notes (Signed)
Patient transported to CT 

## 2022-05-31 NOTE — ED Notes (Signed)
X-ray at bedside

## 2022-05-31 NOTE — ED Triage Notes (Signed)
Patient comes in with complaints of lower back pain x's 3 weeks. Patient states radiates around to bilateral sides. Patient has generalized +4 pitting edema. Patient has tried taking tylenol for pain w/ no relief.

## 2022-06-01 ENCOUNTER — Other Ambulatory Visit (HOSPITAL_COMMUNITY): Payer: Self-pay | Admitting: *Deleted

## 2022-06-01 ENCOUNTER — Observation Stay (HOSPITAL_BASED_OUTPATIENT_CLINIC_OR_DEPARTMENT_OTHER): Payer: Medicare Other

## 2022-06-01 DIAGNOSIS — I5031 Acute diastolic (congestive) heart failure: Secondary | ICD-10-CM

## 2022-06-01 DIAGNOSIS — I50811 Acute right heart failure: Secondary | ICD-10-CM | POA: Diagnosis not present

## 2022-06-01 LAB — BASIC METABOLIC PANEL
Anion gap: 4 — ABNORMAL LOW (ref 5–15)
BUN: 12 mg/dL (ref 8–23)
CO2: 27 mmol/L (ref 22–32)
Calcium: 8.1 mg/dL — ABNORMAL LOW (ref 8.9–10.3)
Chloride: 107 mmol/L (ref 98–111)
Creatinine, Ser: 0.82 mg/dL (ref 0.44–1.00)
GFR, Estimated: >60 mL/min (ref >60)
Glucose, Bld: 75 mg/dL (ref 70–99)
Potassium: 3 mmol/L — ABNORMAL LOW (ref 3.5–5.1)
Sodium: 138 mmol/L (ref 135–145)

## 2022-06-01 LAB — ECHOCARDIOGRAM COMPLETE
AR max vel: 1.61 cm2
AV Area VTI: 1.82 cm2
AV Area mean vel: 1.49 cm2
AV Mean grad: 4.6 mmHg
AV Peak grad: 9.9 mmHg
Ao pk vel: 1.57 m/s
Area-P 1/2: 2.99 cm2
Height: 60 in
MV M vel: 5.13 m/s
MV Peak grad: 105.3 mmHg
P 1/2 time: 507 ms
Radius: 0.45 cm
S' Lateral: 3.5 cm
Weight: 2151.69 [oz_av]

## 2022-06-01 LAB — MAGNESIUM: Magnesium: 1.9 mg/dL (ref 1.7–2.4)

## 2022-06-01 MED ORDER — POLYETHYLENE GLYCOL 3350 17 G PO PACK
17.0000 g | PACK | Freq: Every day | ORAL | Status: DC
  Administered 2022-06-01 – 2022-06-04 (×4): 17 g via ORAL
  Filled 2022-06-01 (×4): qty 1

## 2022-06-01 MED ORDER — POTASSIUM CHLORIDE CRYS ER 20 MEQ PO TBCR
40.0000 meq | EXTENDED_RELEASE_TABLET | Freq: Two times a day (BID) | ORAL | Status: AC
  Administered 2022-06-01 (×2): 40 meq via ORAL
  Filled 2022-06-01 (×2): qty 2

## 2022-06-01 MED ORDER — CHLORHEXIDINE GLUCONATE CLOTH 2 % EX PADS
6.0000 | MEDICATED_PAD | Freq: Every day | CUTANEOUS | Status: DC
  Administered 2022-06-01 – 2022-06-04 (×4): 6 via TOPICAL

## 2022-06-01 MED ORDER — SENNOSIDES-DOCUSATE SODIUM 8.6-50 MG PO TABS
1.0000 | ORAL_TABLET | Freq: Two times a day (BID) | ORAL | Status: DC
  Administered 2022-06-01 – 2022-06-04 (×7): 1 via ORAL
  Filled 2022-06-01 (×7): qty 1

## 2022-06-01 NOTE — Evaluation (Signed)
Occupational Therapy Evaluation Patient Details Name: Leslie Williamson MRN: [id number] DOB: 1931/05/07 Today's Date: 06/01/2022   History of Present Illness Leslie Williamson is a 89 y.o. female with medical history significant of multiple myeloma, hypertension, hyperlipidemia to ED with a complaint of back pain.  According the patient, the pain started about a month ago, it is located at lower lumbar spine and radiates to bilateral hip.  No aggravating or relieving factor.  Pain had gotten worse so she came to the ED.  She did have 1 episode of nocturnal dyspnea 2 nights ago which resolved after she sat up and lasted only for few minutes.  She denies any saddle anesthesia, bowel or bladder incontinence.  Or any weakness in bilateral lower extremities.   Clinical Impression   Pt agreeable to OT evaluation this am. Pt performing functional mobility with and without RW with supervision, standing at sink for hygiene tasks. No LOB or reports of pain. Pt is functioning at baseline for ADL completion. No further OT services required at this time.       Recommendations for follow up therapy are one component of a multi-disciplinary discharge planning process, led by the attending physician.  Recommendations may be updated based on patient status, additional functional criteria and insurance authorization.   Assistance Recommended at Discharge Intermittent Supervision/Assistance  Patient can return home with the following Assistance with cooking/housework    Functional Status Assessment  Patient has had a recent decline in their functional status and demonstrates the ability to make significant improvements in function in a reasonable and predictable amount of time.  Equipment Recommendations  None recommended by OT       Precautions / Restrictions Precautions Precautions: Fall Restrictions Weight Bearing Restrictions: No      Mobility Bed Mobility               General bed mobility  comments: seated in chair on OT arrival    Transfers Overall transfer level: Needs assistance Equipment used: None Transfers: Sit to/from Stand Sit to Stand: Supervision     Step pivot transfers: Min guard                ADL either performed or assessed with clinical judgement   ADL Overall ADL's : Needs assistance/impaired Eating/Feeding: Modified independent;Sitting   Grooming: Wash/dry hands;Wash/dry face;Oral care;Supervision/safety;Standing Grooming Details (indicate cue type and reason): Pt standing at sink for tasks, no LOB or SOB, reports back feels heavy but not painful                 Toilet Transfer: Supervision/safety;Ambulation Toilet Transfer Details (indicate cue type and reason): simulated with functional mobility and transfer to chair         Functional mobility during ADLs: Supervision/safety        Pertinent Vitals/Pain Pain Assessment Pain Assessment: No/denies pain     Hand Dominance Right   Extremity/Trunk Assessment Upper Extremity Assessment Upper Extremity Assessment: Overall WFL for tasks assessed   Lower Extremity Assessment Lower Extremity Assessment: Defer to PT evaluation   Cervical / Trunk Assessment Cervical / Trunk Assessment: Normal   Communication Communication Communication: No difficulties   Cognition Arousal/Alertness: Awake/alert Behavior During Therapy: WFL for tasks assessed/performed Overall Cognitive Status: Within Functional Limits for tasks assessed  Home Living Family/patient expects to be discharged to:: Private residence Living Arrangements: Children;Other relatives Available Help at Discharge: Family;Available 24 hours/day Type of Home: House Home Access: Stairs to enter [employer] of Steps: 2 Entrance Stairs-Rails: Right;Left;Can reach both Home Layout: One level     Bathroom Shower/Tub: Company secretary: Standard     Home Equipment: Cane - single point;Shower seat;Grab bars - tub/shower          Prior Functioning/Environment Prior Level of Function : Independent/Modified Independent             Mobility Comments: short distance community ambulator without AD ADLs Comments: Independent        OT Problem List: Decreased activity tolerance;Pain    AM-PAC OT "6 Clicks" Daily Activity     Outcome Measure Help from another person eating meals?: None Help from another person taking care of personal grooming?: None Help from another person toileting, which includes using toliet, bedpan, or urinal?: None Help from another person bathing (including washing, rinsing, drying)?: A Little Help from another person to put on and taking off regular upper body clothing?: A Little Help from another person to put on and taking off regular lower body clothing?: A Little 6 Click Score: 21   End of Session Equipment Utilized During Treatment: Gait belt;Rolling walker (2 wheels)  Activity Tolerance: Patient tolerated treatment well Patient left: in chair;with call bell/phone within reach;with family/visitor present  OT Visit Diagnosis: Pain Pain - part of body:  (low back)                Time: 1761-6073 OT Time Calculation (min): 25 min Charges:  OT General Charges $OT Visit: 1 Visit OT Evaluation $OT Eval Low Complexity: 1 Low OT Treatments $Self Care/Home Management : 8-22 mins    Ezra Sites, OTR/L  [phone] 06/01/2022, 9:20 AM

## 2022-06-01 NOTE — Progress Notes (Signed)
PROGRESS NOTE  Leslie Williamson  DVV:[id number] DOB: November 30, 1931 DOA: 05/31/2022 PCP: Benetta Spar, MD   Brief Narrative: Patient is 89 year old female with history of multiple myeloma, hypertension, hyperlipidemia who presented with back pain.  Pain started about a month ago and was mainly in the lower back radiating to bilateral hip.  Patient also reported nocturnal dyspnea, bilateral lower extremity edema.  On presentation, she was hemodynamically stable.  Lab work showed elevated BNP, she looked volume overloaded.  Started on IV Lasix.  Echo ordered.  Assessment & Plan:  Principal Problem:   CHF (congestive heart failure) Active Problems:   JAK2+ MDP (myeloproliferative disorder) presenting with thrombocytosis (HCC)   Essential hypertension   Multiple myeloma  Acute on chronic back pain: History of multiple myeloma which could have contributed.  Back pain started since a month.  Imaging showed advanced multilevel degenerative disc disease without any acute findings.  Continue pain management, supportive care.  PT/OT evaluation requested, recommended home health.  Denies any back pain this morning.  Suspected acute on chronic diastolic CHF: Presented with nocturnal dyspnea, edema of the bilateral lower extremities.  Echo on 2017 showed normal EF.  Chest x-ray showed mild left pleural effusion with atelectasis versus infiltrate on the left lower lobe.  BNP elevated.  Started on IV Lasix.  Echo pending.  Continue to monitor daily weight, input/output.  History of multiple myeloma: Follows with Dr. Ellin Saba who was contacted.  As per him, her multiple myeloma is fairly controlled, no need to be seen by oncology has consulted for recommend outpatient follow-up. Imaging showed lucent T12 vertebral body likely associated with history of myeloma.  Also showed 11 mm adrenal nodule on the left suspicious for metastatic disease.  We recommend follow-up with Dr. Ellin Saba as an outpatient.   No other acute findings on CT abdomen/pelvis. His last chemotherapy was 2 weeks ago.  Hypertension: Will continue to monitor blood pressure.  Currently on amlodipine  Hyperlipidemia: On Lipitor  Hypokalemia: Being supplemented and monitored.  Goals of care: Elderly patient with multiple myeloma.  CODE STATUS is DNR.       DVT prophylaxis:enoxaparin (LOVENOX) injection 40 mg Start: 05/31/22 1800     Code Status: DNR  Family Communication: Daughter/son at bedside  Patient status: Obs  Patient is from : Home  Anticipated discharge to: Home  Estimated DC date: 1 to 2 days, need to have improvement in the leg edema, echo pending   Consultants: None  Procedures: None  Antimicrobials:  Anti-infectives (From admission, onward)    Start     Dose/Rate Route Frequency Ordered Stop   05/31/22 2200  acyclovir (ZOVIRAX) tablet 400 mg        400 mg Oral 2 times daily 05/31/22 1658         Subjective: Patient seen and examined the bedside today.  Hemodynamically stable.  Sitting in the chair.  Denies any back pain today.  Has lower extreme edema but improving.  Denies shortness of breath or cough.  On room air.  Objective: Vitals:   05/31/22 2005 05/31/22 2338 06/01/22 0447 06/01/22 0500  BP: 129/67 133/60 130/76   Pulse: 64 (!) 56 (!) 58   Resp: 14 16 18    Temp: 97.8 F (36.6 C) 97.9 F (36.6 C) 98.4 F (36.9 C)   TempSrc: Oral Oral    SpO2: 96% 98% 95%   Weight: 64.9 kg   61 kg  Height: 5' (1.524 m)       Intake/Output Summary (  Last 24 hours) at 06/01/2022 0729 Last data filed at 06/01/2022 0500 Gross per 24 hour  Intake 600 ml  Output 3100 ml  Net -2500 ml   Filed Weights   05/31/22 1120 05/31/22 2005 06/01/22 0500  Weight: 67.5 kg 64.9 kg 61 kg    Examination:  General exam: Overall comfortable, not in distress, pleasant elderly female HEENT: PERRL Respiratory system: Bibasilar crackles. Cardiovascular system: S1 & S2 heard, RRR.  Chemo-Port on the right  chest Gastrointestinal system: Abdomen is nondistended, soft and nontender. Central nervous system: Alert and oriented Extremities: 2-3 plus bilateral lower extremity pitting edema, no clubbing ,no cyanosis Skin: No rashes, no ulcers,no icterus     Data Reviewed: I have personally reviewed following labs and imaging studies  CBC: Recent Labs  Lab 05/31/22 1153  WBC 5.8  HGB 9.7*  HCT 30.4*  MCV 89.1  PLT 402*   Basic Metabolic Panel: Recent Labs  Lab 05/31/22 1153 06/01/22 0414  NA 140 138  K 3.5 3.0*  CL 110 107  CO2 24 27  GLUCOSE 87 75  BUN 13 12  CREATININE 0.79 0.82  CALCIUM 8.5* 8.1*  MG  --  1.9     No results found for this or any previous visit (from the past 240 hour(s)).   Radiology Studies: CT L-SPINE NO CHARGE  Result Date: 05/31/2022 CLINICAL DATA:  Back pain radiating to the sides EXAM: CT Lumbar Spine without contrast TECHNIQUE: Technique: Multiplanar CT images of the lumbar spine were reconstructed from contemporary CT of the Abdomen and Pelvis. RADIATION DOSE REDUCTION: This exam was performed according to the departmental dose-optimization program which includes automated exposure control, adjustment of the mA and/or kV according to patient size and/or use of iterative reconstruction technique. CONTRAST:  None or No additional COMPARISON:  PET CT 11/05/2021, MRI 07/07/2012 FINDINGS: Segmentation: 5 lumbar type vertebrae. Alignment: 6 mm anterolisthesis L3 on L4. Vertebrae: No acute fracture or focal pathologic process. Lucent lesion at T11. Paraspinal and other soft tissues: See separately dictated CT abdomen pelvis. Disc levels: At T12-L1, maintained disc space. No canal stenosis. The foramen are patent. At L1-L2, patent disc space. No canal stenosis. The foramen are patent. At L2-L3, shallow left paracentral disc protrusion. No high-grade canal stenosis. Moderate facet degenerative changes. No bony foraminal narrowing. At L3-L4, moderate disc space  narrowing with vacuum disc. At least moderate canal stenosis. Advanced hypertrophic facet degenerative changes. Bilateral foraminal narrowing. At L4-L5, advanced disc space narrowing with endplate sclerosis. Posterior disc osteophyte complex with at least mild canal stenosis. Advanced bilateral facet arthropathy. Bilateral foraminal narrowing. At L5-S1, mild disc space narrowing. No canal stenosis. Advanced facet degenerative changes. No foraminal narrowing. IMPRESSION: 1. No CT evidence for acute osseous abnormality. 2. Grade 1 anterolisthesis L3 on L4 with advanced multilevel degenerative changes, worst at at L3-L4 and L4-L5. Advanced facet degenerative changes at multiple levels. 3. Lucent lesion at T11 potentially related to history of myeloma Electronically Signed   By: Jasmine PangKim  Fujinaga M.D.   On: 05/31/2022 15:41   CT Abdomen Pelvis W Contrast  Result Date: 05/31/2022 CLINICAL DATA:  Low back pain radiating to the sides EXAM: CT ABDOMEN AND PELVIS WITH CONTRAST TECHNIQUE: Multidetector CT imaging of the abdomen and pelvis was performed using the standard protocol following bolus administration of intravenous contrast. RADIATION DOSE REDUCTION: This exam was performed according to the departmental dose-optimization program which includes automated exposure control, adjustment of the mA and/or kV according to patient size and/or use of  iterative reconstruction technique. CONTRAST:  OMNIPAQUE IOHEXOL 300 MG/ML  SOLN COMPARISON:  PET CT 11/05/2021, CT chest 08/05/2021, CT abdomen pelvis 07/04/2012 FINDINGS: Lower chest: Lung bases demonstrate cardiomegaly. Partially visualized central venous catheter tip at the cavoatrial region. Small bilateral pleural effusions. Passive atelectasis at the bases. Hepatobiliary: Slightly heterogeneous liver enhancement without focal mass. Status post cholecystectomy. No biliary dilatation. Pancreas: Unremarkable. No pancreatic ductal dilatation or surrounding inflammatory  changes. Spleen: Splenectomy Adrenals/Urinary Tract: Right adrenal gland is normal. 11 mm nodule in the left adrenal gland, series 2, image 17. Kidneys show no hydronephrosis. Bilateral renal cysts and subcentimeter hypodensities too small to further characterize, no imaging follow-up is recommended. Urinary bladder is unremarkable Stomach/Bowel: Stomach nonenlarged. No dilated small bowel. No acute bowel wall thickening. Diffuse diverticular disease of the colon without acute wall thickening. Vascular/Lymphatic: Advanced aortic atherosclerosis. No aneurysm. No suspicious lymph nodes. Reproductive: Status post hysterectomy. No adnexal masses. Other: No free air. Small volume free fluid in the pelvis. Water density collection adjacent to the inferior margin of the liver measures 4.7 x 3.5 cm, not metabolically active on recent PET CT. Musculoskeletal: No acute osseous abnormality. Lucent lesion at T12 vertebral body, not convincingly seen on previous exams. IMPRESSION: 1. No CT evidence for acute intra-abdominal or pelvic abnormality. 2. Cardiomegaly with small bilateral pleural effusions. Small volume free fluid in the pelvis. 3. Diffuse diverticular disease of the colon without acute wall thickening. 4. Lucent lesion at T12 vertebral body, not convincingly seen on previous exams, and potentially related to the history of myeloma. 5. 11 mm indeterminate left adrenal nodule, not definitively seen on prior, raising concern for metastatic disease. 6. Aortic atherosclerosis. Aortic Atherosclerosis (ICD10-I70.0). Electronically Signed   By: Jasmine Pang M.D.   On: 05/31/2022 15:30   DG Chest Portable 1 View  Result Date: 05/31/2022 CLINICAL DATA:  Shortness of breath. EXAM: PORTABLE CHEST 1 VIEW COMPARISON:  August 05, 2021. FINDINGS: Stable cardiomegaly. Mild left pleural effusion is noted with associated left basilar atelectasis or infiltrate. Minimal right basilar subsegmental atelectasis is noted. Right internal  jugular Port-A-Cath is noted with distal tip in expected position of the SVC. Bony thorax is unremarkable. IMPRESSION: Mild left pleural effusion with associated left basilar atelectasis or infiltrate. Electronically Signed   By: Lupita Raider M.D.   On: 05/31/2022 14:48    Scheduled Meds:  acyclovir  400 mg Oral BID   amLODipine  5 mg Oral Daily   aspirin EC  81 mg Oral Daily   atorvastatin  40 mg Oral Daily   Chlorhexidine Gluconate Cloth  6 each Topical Daily   enoxaparin (LOVENOX) injection  40 mg Subcutaneous Q24H   furosemide  40 mg Intravenous BID   potassium chloride  40 mEq Oral BID   sodium chloride flush  3 mL Intravenous Q12H   Continuous Infusions:  sodium chloride       LOS: 0 days   Burnadette Pop, MD [facility] P4/10/2022, 7:29 AM

## 2022-06-01 NOTE — Progress Notes (Signed)
*  PRELIMINARY RESULTS* Echocardiogram 2D Echocardiogram has been performed.  Stacey Drain 06/01/2022, 12:21 PM

## 2022-06-01 NOTE — Plan of Care (Signed)

## 2022-06-01 NOTE — Progress Notes (Signed)
Patient had ReDS clip reading ordered this morning, noted patient had chest port accessed to right chest. Unable to get reading due to chest port in placed. MD Adhikari made aware. No new orders.

## 2022-06-01 NOTE — Care Management Obs Status (Signed)
MEDICARE OBSERVATION STATUS NOTIFICATION   Patient Details  Name: Leslie Williamson MRN: [id number] Date of Birth: 12-24-1931   Medicare Observation Status Notification Given:  Yes    Corey Harold 06/01/2022, 3:19 PM

## 2022-06-01 NOTE — Plan of Care (Signed)
  Problem: Acute Rehab PT Goals(only PT should resolve) Goal: Pt Will Go Supine/Side To Sit Outcome: Progressing Flowsheets (Taken 06/01/2022 0910) Pt will go Supine/Side to Sit: with supervision Goal: Pt Will Go Sit To Supine/Side Outcome: Progressing Flowsheets (Taken 06/01/2022 0910) Pt will go Sit to Supine/Side: with supervision Goal: Patient Will Transfer Sit To/From Stand Outcome: Progressing Flowsheets (Taken 06/01/2022 0910) Patient will transfer sit to/from stand:  with supervision  with min guard assist Goal: Pt Will Transfer Bed To Chair/Chair To Bed Outcome: Progressing Flowsheets (Taken 06/01/2022 0910) Pt will Transfer Bed to Chair/Chair to Bed:  with supervision  min guard assist Goal: Pt Will Ambulate Outcome: Progressing Flowsheets (Taken 06/01/2022 0910) Pt will Ambulate:  50 feet  with min guard assist  with least restrictive assistive device Goal: Pt/caregiver will Perform Home Exercise Program Outcome: Progressing Flowsheets (Taken 06/01/2022 0910) Pt/caregiver will Perform Home Exercise Program:  For increased strengthening  For improved balance  With Supervision, verbal cues required/provided  9:11 AM, 06/01/22 Wyman Songster PT, DPT Physical Therapist at [facility]

## 2022-06-01 NOTE — Progress Notes (Signed)
   06/01/22 1018  What Happened  Was fall witnessed? Yes  Who witnessed fall? family members in room with patient  Patients activity before fall ambulating-unassisted (back from bathroom)  Point of contact buttocks  Was patient injured? Unsure (no reports of pain, no apparant sign of injury)  Provider Notification  Provider Name/Title dr Renford Dills  Date Provider Notified 06/01/22  Time Provider Notified 1018  Method of Notification  (secure chat)  Notification Reason Fall  Provider response No new orders  Date of Provider Response 06/01/22  Time of Provider Response 1015  Follow Up  Family notified Yes - comment (family at bedside)  Time family notified 1000 (by Waylan Boga, LPN assigned nurse)  Progress note created (see row info) Yes  Adult Fall Risk Assessment  Risk Factor Category (scoring not indicated) High fall risk per protocol (document High fall risk)  Patient Fall Risk Level High fall risk  Adult Fall Risk Interventions  Required Bundle Interventions *See Row Information* High fall risk - low, moderate, and high requirements implemented  Additional Interventions Use of appropriate toileting equipment (bedpan, BSC, etc.)  Fall intervention(s) refused/Patient educated regarding refusal Bed alarm;Nonskid socks;Open door if unsupervised;Yellow bracelet;Supervision while toileting/edge of bed sitting  Screening for Fall Injury Risk (To be completed on HIGH fall risk patients) - Assessing Need for Floor Mats  Risk For Fall Injury- Criteria for Floor Mats 85 years or older;Previous fall this admission  Will Implement Floor Mats Yes

## 2022-06-01 NOTE — Evaluation (Signed)
Physical Therapy Evaluation Patient Details Name: Leslie Williamson MRN: [id number] DOB: April 17, 1931 Today's Date: 06/01/2022  History of Present Illness  Leslie LivBernice G Jury is a 89 y.o. female with medical history significant of multiple myeloma, hypertension, hyperlipidemia to ED with a complaint of back pain.  According the patient, the pain started about a month ago, it is located at lower lumbar spine and radiates to bilateral hip.  No aggravating or relieving factor.  Pain had gotten worse so she came to the ED.  She did have 1 episode of nocturnal dyspnea 2 nights ago which resolved after she sat up and lasted only for few minutes.  She denies any saddle anesthesia, bowel or bladder incontinence.  Or any weakness in bilateral lower extremities.   Clinical Impression  Patient limited for functional mobility as stated below secondary to BLE weakness, fatigue and impaired standing balance. Patient requires heavy use of bed rails but no hands on assist to transition to seated EOB. She demonstrates good sitting tolerance and sitting balance at bedside. She is able to to transfer to standing without AD but requires assist for strength/balance deficit and is very unsteady upon standing. Patient with improved tranfer ability and balance with use of RW. Patient ambulates with RW with slow cadence but without loss of balance and ends session seated in chair at bedside. Patient would benefit from a rolling walker for return home to improve balance and safety with transfers and ambulation. Patient will benefit from continued physical therapy in hospital and recommended venue below to increase strength, balance, endurance for safe ADLs and gait.        Recommendations for follow up therapy are one component of a multi-disciplinary discharge planning process, led by the attending physician.  Recommendations may be updated based on patient status, additional functional criteria and insurance  authorization.  Follow Up Recommendations       Assistance Recommended at Discharge Intermittent Supervision/Assistance  Patient can return home with the following  A little help with walking and/or transfers;A little help with bathing/dressing/bathroom;Assistance with cooking/housework;Assist for transportation;Help with stairs or ramp for entrance    Equipment Recommendations Rolling walker (2 wheels)  Recommendations for Other Services       Functional Status Assessment Patient has had a recent decline in their functional status and demonstrates the ability to make significant improvements in function in a reasonable and predictable amount of time.     Precautions / Restrictions Precautions Precautions: Fall Restrictions Weight Bearing Restrictions: No      Mobility  Bed Mobility Overal bed mobility: Needs Assistance Bed Mobility: Supine to Sit     Supine to sit: Supervision, Min guard     General bed mobility comments: requires heavy use of bed rails    Transfers Overall transfer level: Needs assistance Equipment used: None, Rolling walker (2 wheels) Transfers: Sit to/from Stand, Bed to chair/wheelchair/BSC Sit to Stand: Min guard, Min assist   Step pivot transfers: Min guard       General transfer comment: able to transfer to standing with min G/A without AD but very unsteady upon standing requiring assist for standing balance, improved transfer to standing with use of RW    Ambulation/Gait Ambulation/Gait assistance: Min guard Gait Distance (Feet): 20 Feet Assistive device: Rolling walker (2 wheels) Gait Pattern/deviations: Step-through pattern, Decreased stride length Gait velocity: decreased     General Gait Details: slow, labored cadence with RW  Careers information officertairs            Wheelchair Mobility  Modified Rankin (Stroke Patients Only)       Balance Overall balance assessment: Needs assistance Sitting-balance support: No upper extremity  supported, Feet supported Sitting balance-Leahy Scale: Good Sitting balance - Comments: seated EOB   Standing balance support: Bilateral upper extremity supported Standing balance-Leahy Scale: Fair Standing balance comment: good/fair with RW                             Pertinent Vitals/Pain Pain Assessment Pain Assessment: No/denies pain    Home Living Family/patient expects to be discharged to:: Private residence Living Arrangements: Children;Other relatives Available Help at Discharge: Family;Available 24 hours/day Type of Home: House Home Access: Stairs to enter Entrance Stairs-Rails: Right;Left;Can reach both Entrance Stairs-Number of Steps: 2   Home Layout: One level Home Equipment: Cane - single point;Shower seat;Grab bars - tub/shower      Prior Function Prior Level of Function : Independent/Modified Independent             Mobility Comments: short distance community ambulator without AD ADLs Comments: Independent     Hand Dominance        Extremity/Trunk Assessment   Upper Extremity Assessment Upper Extremity Assessment: Defer to OT evaluation    Lower Extremity Assessment Lower Extremity Assessment: Overall WFL for tasks assessed;Generalized weakness    Cervical / Trunk Assessment Cervical / Trunk Assessment: Normal  Communication   Communication: No difficulties  Cognition Arousal/Alertness: Awake/alert Behavior During Therapy: WFL for tasks assessed/performed Overall Cognitive Status: Within Functional Limits for tasks assessed                                          General Comments      Exercises     Assessment/Plan    PT Assessment Patient needs continued PT services  PT Problem List Decreased strength;Decreased activity tolerance;Decreased balance;Decreased mobility       PT Treatment Interventions Therapeutic exercise;DME instruction;Gait training;Balance training;Stair training;Neuromuscular  re-education;Functional mobility training;Therapeutic activities;Patient/family education    PT Goals (Current goals can be found in the Care Plan section)  Acute Rehab PT Goals Patient Stated Goal: return home PT Goal Formulation: With patient Time For Goal Achievement: 06/15/22 Potential to Achieve Goals: Good    Frequency Min 3X/week     Co-evaluation               AM-PAC PT "6 Clicks" Mobility  Outcome Measure Help needed turning from your back to your side while in a flat bed without using bedrails?: A Little Help needed moving from lying on your back to sitting on the side of a flat bed without using bedrails?: A Little Help needed moving to and from a bed to a chair (including a wheelchair)?: A Little Help needed standing up from a chair using your arms (e.g., wheelchair or bedside chair)?: A Little Help needed to walk in hospital room?: A Little Help needed climbing 3-5 steps with a railing? : A Little 6 Click Score: 18    End of Session Equipment Utilized During Treatment: Gait belt Activity Tolerance: Patient tolerated treatment well;Patient limited by fatigue Patient left: in chair;with call bell/phone within reach Nurse Communication: Mobility status PT Visit Diagnosis: Unsteadiness on feet (R26.81);Other abnormalities of gait and mobility (R26.89);Muscle weakness (generalized) (M62.81)    Time: 6314-9702 PT Time Calculation (min) (ACUTE ONLY): 27 min   Charges:  PT Evaluation $PT Eval Low Complexity: 1 Low PT Treatments $Therapeutic Activity: 8-22 mins        9:07 AM, 06/01/22 Wyman Songster PT, DPT Physical Therapist at [facility]

## 2022-06-01 NOTE — TOC Initial Note (Addendum)
Transition of Care [facility]) - Initial/Assessment Note    Patient Details  Name: Leslie Williamson MRN: [id number] Date of Birth: 07-Dec-1931  Transition of Care [facility]) CM/SW Contact:    Villa Herb, LCSWA Phone Number: 06/01/2022, 11:07 AM  Clinical Narrative:                 TOC updated that PT is recommending HH PT. CSW spoke with pts son to complete assessment. One of pts son's lives with her. Pt is mostly independent in completing her ADLs. Pt's family is able to provide transportation when needed. Pt has not had HH. CSW spoke with family about interest in [facility] setting this up, they are interested and do not have agency preference. Pt does not use any DME but family is interested in a walker being ordered. DME order placed and walker has been ordered for pt through [employer]. Belenda Cruise with [employer] will work on order and get walker drop shipped to pts home. TOC to follow.   CSW completed CHF screen with family. Pt does not follow a heart healthy diet. Pt takes all medications as prescribed. Pt does not take daily weights. CHF info to be added to AVS for pt. TOC to follow.   Expected Discharge Plan: Home w Home Health Services Barriers to Discharge: Continued Medical Work up   Patient Goals and CMS Choice Patient states their goals for this hospitalization and ongoing recovery are:: return home CMS Medicare.gov Compare Post Acute Care list provided to:: Patient Represenative (must comment) Choice offered to / list presented to : Patient, Adult Children      Expected Discharge Plan and Services In-house Referral: Clinical Social Work Discharge Planning Services: CM Consult Post Acute Care Choice: Home Health, Durable Medical Equipment Living arrangements for the past 2 months: Single Family Home                                      Prior Living Arrangements/Services Living arrangements for the past 2 months: Single Family Home Lives with:: Adult Children Patient language and  need for interpreter reviewed:: Yes Do you feel safe going back to the place where you live?: Yes      Need for Family Participation in Patient Care: Yes (Comment) Care giver support system in place?: Yes (comment)   Criminal Activity/Legal Involvement Pertinent to Current Situation/Hospitalization: No - Comment as needed  Activities of Daily Living Home Assistive Devices/Equipment: None ADL Screening (condition at time of admission) Patient's cognitive ability adequate to safely complete daily activities?: Yes Is the patient deaf or have difficulty hearing?: No Does the patient have difficulty seeing, even when wearing glasses/contacts?: No Does the patient have difficulty concentrating, remembering, or making decisions?: No Patient able to express need for assistance with ADLs?: Yes Does the patient have difficulty dressing or bathing?: No Independently performs ADLs?: Yes (appropriate for developmental age) Does the patient have difficulty walking or climbing stairs?: No Weakness of Legs: None Weakness of Arms/Hands: None  Permission Sought/Granted                  Emotional Assessment Appearance:: Appears stated age Attitude/Demeanor/Rapport: Engaged Affect (typically observed): Accepting Orientation: : Oriented to Self, Oriented to Place, Oriented to  Time, Oriented to Situation Alcohol / Substance Use: Not Applicable Psych Involvement: No (comment)  Admission diagnosis:  CHF (congestive heart failure) [I50.9] Lower extremity edema [R60.0] Pleural effusion [J90] Adrenal nodule [  E27.8] Patient Active Problem List   Diagnosis Date Noted   CHF (congestive heart failure) 05/31/2022   Drug-induced neutropenia 01/20/2022   Multiple myeloma 11/16/2021   Acute renal failure superimposed on stage 3a chronic kidney disease 08/06/2021   Essential thrombocytosis 08/06/2021   MGUS (monoclonal gammopathy of unknown significance) 08/06/2021   COVID-19 virus infection 08/05/2021    Acute respiratory failure with hypoxia 08/05/2021   Occult blood positive stool 10/23/2020   Labyrinthine vertigo 10/26/2015   Elevated troponin 10/25/2015   Chest pain 10/25/2015   Vertigo    S/P splenectomy 01/01/2014   Claudication of both lower extremities 11/06/2013   NHL (non-Hodgkin's lymphoma) 12/17/2010   WEIGHT LOSS, ABNORMAL 02/05/2009   COLONIC POLYPS, ADENOMATOUS, HX OF 02/05/2009   GERD 08/08/2008   Iron deficiency anemia 06/12/2008   ANXIETY 03/20/2007   HYPERLIPIDEMIA 01/11/2006   JAK2+ MDP (myeloproliferative disorder) presenting with thrombocytosis (HCC) 01/11/2006   CARPAL TUNNEL SYNDROME 01/11/2006   Essential hypertension 01/11/2006   PVD- know Lt SFA disease, bilat PT and AT disease 01/11/2006   ALLERGIC RHINITIS 01/11/2006   SEBORRHEIC KERATOSIS 01/11/2006   PCP:  Benetta Spar, MD Pharmacy:   [facility] [place], [place] - [address] [place] [address] Phone: [phone] Fax: [phone]  [facility], [place] - [address] [address] [place] [address] Phone: [phone] Fax: [phone]     Social Determinants of Health (SDOH) Social History: SDOH Screenings   Food Insecurity: No Food Insecurity (05/31/2022)  Housing: Low Risk  (05/31/2022)  Transportation Needs: No Transportation Needs (05/31/2022)  Utilities: Not At Risk (05/31/2022)  Alcohol Screen: Low Risk  (12/31/2019)  Depression (PHQ2-9): Low Risk  (12/31/2019)  Financial Resource Strain: Low Risk  (12/31/2019)  Physical Activity: Inactive (12/31/2019)  Social Connections: Moderately Isolated (12/31/2019)  Stress: No Stress Concern Present (12/31/2019)  Tobacco Use: Medium Risk (05/31/2022)   SDOH Interventions:     Readmission Risk Interventions     No data to display

## 2022-06-01 NOTE — Progress Notes (Signed)
This Clinical research associate assisted patient to bathroom with family at bedside. Patient and family informed to pull bathroom cord for staff to assist patient back to chair, family and patient understood. NT seen patient sitting on the floor in room in front of chair, family at bedside. Patient transferred self from bathroom to chair. Patient reported that she just went to set down on the chair reclined and landed on her butt. Patient reported no complaints of pain or discomfort. Patient reported she did not hit her head. Patient and family educated regarding using the call light for staff to assist patient, patient and family verbalized understanding. MD Adhikari made aware. Bed alarm activated, fall bracelet placed and fall matts placed in patient room.

## 2022-06-02 ENCOUNTER — Encounter: Payer: Self-pay | Admitting: Hematology

## 2022-06-02 ENCOUNTER — Other Ambulatory Visit (HOSPITAL_COMMUNITY): Payer: Self-pay

## 2022-06-02 DIAGNOSIS — I5023 Acute on chronic systolic (congestive) heart failure: Secondary | ICD-10-CM | POA: Diagnosis not present

## 2022-06-02 DIAGNOSIS — I50811 Acute right heart failure: Secondary | ICD-10-CM | POA: Diagnosis not present

## 2022-06-02 LAB — BASIC METABOLIC PANEL
Anion gap: 7 (ref 5–15)
BUN: 12 mg/dL (ref 8–23)
CO2: 27 mmol/L (ref 22–32)
Calcium: 8.4 mg/dL — ABNORMAL LOW (ref 8.9–10.3)
Chloride: 104 mmol/L (ref 98–111)
Creatinine, Ser: 1 mg/dL (ref 0.44–1.00)
GFR, Estimated: 54 mL/min — ABNORMAL LOW (ref >60)
Glucose, Bld: 92 mg/dL (ref 70–99)
Potassium: 4.1 mmol/L (ref 3.5–5.1)
Sodium: 138 mmol/L (ref 135–145)

## 2022-06-02 MED ORDER — DAPAGLIFLOZIN PROPANEDIOL 10 MG PO TABS
10.0000 mg | ORAL_TABLET | Freq: Every day | ORAL | Status: DC
  Administered 2022-06-02 – 2022-06-04 (×3): 10 mg via ORAL
  Filled 2022-06-02 (×3): qty 1

## 2022-06-02 MED ORDER — METOPROLOL SUCCINATE ER 25 MG PO TB24
12.5000 mg | ORAL_TABLET | Freq: Every day | ORAL | Status: DC
  Administered 2022-06-02 – 2022-06-04 (×3): 12.5 mg via ORAL
  Filled 2022-06-02 (×3): qty 1

## 2022-06-02 NOTE — Consult Note (Addendum)
Cardiology Consultation   Patient ID: KENZEE Williamson MRN: [id number]; DOB: 12/15/1931  Admit date: 05/31/2022 Date of Consult: 06/02/2022  PCP:  Benetta Spar, MD   [facility] Providers Cardiologist:  Previously Dr. Allyson Sabal in 2015  Patient Profile:   Leslie Williamson is a 89 y.o. female with a hx of multiple myeloma, history of lymphoma, HTN and HLD who is being seen 06/02/2022 for the evaluation of CHF at the request of Dr. Renford Dills.  History of Present Illness:   [name] presented to [facility] ED on 05/31/2022 for evaluation of worsening back pain and lower extremity edema for the past 3 weeks. In talking with the patient today, she reports her main issue at the time of admission was worsening back pain and pain along her legs. She has an Rx for Lasix as needed but had not utilized this prior to admission. Unsure of her baseline weight but thinks it was previously in the 140's. She does report episodes of orthopnea and PND at home but was unsure what was causing her symptoms. No specific chest pain or palpitations.  Initial labs showed WBC 5.8, Hgb 9.7 (baseline around 10), platelets 402, Na+ 140, K+ 3.5 and creatinine 0.79. Albumin low at 2.8. BNP elevated to 1070. CXR showed mild left pleural effusion with associated left basilar atelectasis or infiltrate. CT abdomen showed no evidence of acute intra-abdominal abnormalities. Was noted to have cardiomegaly with small bilateral pleural effusions and diffuse diverticular disease of the colon without acute wall thickening. EKG showed normal sinus rhythm, heart rate 72 with LVH and IVCD.  An echocardiogram was obtained yesterday and showed that her EF was mildly reduced at 45 to 50% with grade 1 diastolic dysfunction. Was noted to have global hypokinesis. RV function was normal with normal PASP. She did have a severely dilated LA, mild MR and mild AI. Her last echocardiogram for comparison was in 2017 and EF was normal  at 60 to 65% at that time.  She has been receiving IV Lasix  BID with a recorded net output of -3.6 L thus far. Variable weights recorded as at 134 lbs yesterday and 128 lbs today. ReDS unable to be obtained due to chest port.    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   Vertigo, labyrinthine     Past Surgical History:  Procedure Laterality Date   CATARACT EXTRACTION W/PHACO Left 11/03/2015   Procedure: CATARACT EXTRACTION PHACO AND INTRAOCULAR LENS PLACEMENT (IOC);  Surgeon: Gemma Payor, MD;  Location: [facility];  Service: Ophthalmology;  Laterality: Left;  CDE: 7.74   CATARACT EXTRACTION W/PHACO Right 11/20/2015   Procedure: CATARACT EXTRACTION PHACO AND INTRAOCULAR LENS PLACEMENT ; CDE:  8.30;  Surgeon: Gemma Payor, MD;  Location: [facility];  Service: Ophthalmology;  Laterality: Right;   IR IMAGING GUIDED PORT INSERTION  11/04/2021   PORT-A-CATH REMOVAL       Home Medications:  Prior  to Admission medications   Medication Sig Start Date End Date Taking? Authorizing Provider  acyclovir (ZOVIRAX) 400 MG tablet Take 1 tablet (400 mg total) by mouth 2 (two) times daily. 12/30/21  Yes Doreatha Massed, MD  amLODipine (NORVASC) 5 MG tablet Take 1 tablet (5 mg total) by mouth daily. 08/07/21  Yes Tat, Onalee Hua, MD  aspirin 81 MG tablet Take 81 mg by mouth daily.   Yes [provider]  atorvastatin (LIPITOR) 40 MG tablet Take 40 mg by mouth daily. 01/07/15  Yes [provider]  dexamethasone (DECADRON) 4 MG tablet  Take 20 mg (5 tablets) by mouth weekly every Wednesday morning 03/24/22  Yes Doreatha Massed, MD  furosemide (LASIX) 20 MG tablet Take 20 mg by mouth daily as needed. 05/10/22  Yes [provider]  ketorolac (TORADOL) 10 MG tablet Take 1 tablet (10 mg total) by mouth every 6 (six) hours as needed. 03/24/22  Yes Doreatha Massed, MD  lenalidomide (REVLIMID) 15 MG capsule Take 1 capsule (15 mg total) by mouth daily. Take for 21 days on, 7 days off. 05/14/22  Yes Doreatha Massed, MD  potassium chloride SA (KLOR-CON M) 20 MEQ tablet Take 2 tablets (40 mEq total) by mouth daily. 01/06/22  Yes Doreatha Massed, MD  baclofen (LIORESAL) 10 MG tablet Take 10 mg by mouth 2 (two) times daily. Patient not taking: Reported on 05/31/2022 02/19/22   [provider]  busPIRone (BUSPAR) 5 MG tablet Take 5 mg by mouth at bedtime as needed (anxiety/sleep). Patient not taking: Reported on 05/31/2022    [provider]  cilostazol (PLETAL) 100 MG tablet Take 100 mg by mouth 2 (two) times daily. Patient not taking: Reported on 05/31/2022 09/03/21   [provider]  ferrous sulfate 325 (65 FE) MG EC tablet Take 1 tablet (325 mg total) by mouth daily with breakfast. Patient not taking: Reported on 05/31/2022 04/08/20   Mauro Kaufmann, NP  lidocaine-prilocaine (EMLA) cream Apply 1 Application topically as needed. Patient not taking: Reported on 05/31/2022 11/05/21   Georga Kaufmann T, PA-C  prochlorperazine (COMPAZINE) 5 MG tablet Take 1 tablet (5 mg total) by mouth every 6 (six) hours as needed for nausea or vomiting. Patient not taking: Reported on 05/31/2022 11/25/21   Doreatha Massed, MD  tiZANidine (ZANAFLEX) 2 MG tablet Take 2 mg by mouth 2 (two) times daily. Patient not taking: Reported on 05/31/2022 02/10/22   [provider]  traMADol (ULTRAM) 50 MG tablet Take 1 tablet (50 mg total) by mouth every 12 (twelve) hours as needed. Patient not taking: Reported on  05/31/2022 02/27/22   Leath-Warren, Sadie Haber, NP  traZODone (DESYREL) 50 MG tablet Take 50 mg by mouth at bedtime as needed. Patient not taking: Reported on 05/31/2022 11/23/21   [provider]    Inpatient Medications: Scheduled Meds:  acyclovir  400 mg Oral BID   amLODipine  5 mg Oral Daily   aspirin EC  81 mg Oral Daily   atorvastatin  40 mg Oral Daily   Chlorhexidine Gluconate Cloth  6 each Topical Daily   enoxaparin (LOVENOX) injection  40 mg Subcutaneous Q24H   furosemide  40 mg Intravenous BID   polyethylene glycol  17 g Oral Daily   senna-docusate  1 tablet Oral BID   sodium chloride flush  3 mL Intravenous Q12H   Continuous Infusions:  sodium chloride     PRN Meds: sodium chloride, acetaminophen, ketorolac, ondansetron (ZOFRAN) IV, oxyCODONE-acetaminophen, sodium chloride flush  Allergies:  Allergies  Allergen Reactions   Penicillins Rash    Social History:   Social History   Socioeconomic History   Marital status: Widowed    Spouse name: Not on file   Number of children: Not on file   Years of education: Not on file   Highest education level: Not on file  Occupational History   Not on file  Tobacco Use   Smoking status: Former   Smokeless tobacco: Never  Vaping Use   Vaping Use: Never used  Substance and Sexual Activity   Alcohol use: No   Drug use: No   Sexual activity: Not Currently  Other Topics Concern   Not on file  Social History Narrative   Not on file   Social Determinants of Health   Financial Resource Strain: Low Risk  (12/31/2019)   Overall Financial Resource Strain (CARDIA)    Difficulty of Paying Living Expenses: Not hard at all  Food Insecurity: No Food Insecurity (05/31/2022)   Hunger Vital Sign    Worried About Running Out of Food in the Last Year: Never true    Ran Out of Food in the Last Year: Never true  Transportation Needs: No Transportation Needs (05/31/2022)   PRAPARE - Administrator, Civil Service  (Medical): No    Lack of Transportation (Non-Medical): No  Physical Activity: Inactive (12/31/2019)   Exercise Vital Sign    Days of Exercise per Week: 0 days    Minutes of Exercise per Session: 0 min  Stress: No Stress Concern Present (12/31/2019)   [employer] of Occupational Health - Occupational Stress Questionnaire    Feeling of Stress : Not at all  Social Connections: Moderately Isolated (12/31/2019)   Social Connection and Isolation Panel [NHANES]    Frequency of Communication with Friends and Family: More than three times a week    Frequency of Social Gatherings with Friends and Family: More than three times a week    Attends Religious Services: More than 4 times per year    Active Member of [employer] or Organizations: No    Attends Banker Meetings: Never    Marital Status: Widowed  Intimate Partner Violence: Not At Risk (05/31/2022)   Humiliation, Afraid, Rape, and Kick questionnaire    Fear of Current or Ex-Partner: No    Emotionally Abused: No    Physically Abused: No    Sexually Abused: No    Family History:    Family History  Problem Relation Age of Onset   Diabetes Mother      ROS:  Please see the history of present illness.   All other ROS reviewed and negative.     Physical Exam/Data:   Vitals:   06/01/22 1619 06/01/22 2041 06/01/22 2350 06/02/22 0550  BP: 137/61 (!) 127/56 (!) 125/57 126/71  Pulse: 66 70 60 70  Resp: 20 18 18 18   Temp: 98.6 F (37 C) 98.9 F (37.2 C) 98.8 F (37.1 C) 98.7 F (37.1 C)  TempSrc: Oral Oral Oral   SpO2: 99% 93% 95% 97%  Weight:    58.4 kg  Height:        Intake/Output Summary (Last 24 hours) at 06/02/2022 0919 Last data filed at 06/02/2022 0500 Gross per 24 hour  Intake 480 ml  Output 1900 ml  Net -1420 ml      06/02/2022    5:50 AM 06/01/2022    5:00 AM 05/31/2022    8:05 PM  Last 3 Weights  Weight (lbs) 128 lb 12 oz 134 lb 7.7 oz 143 lb 1.3 oz  Weight (kg) 58.4 kg 61 kg 64.9 kg     Body mass  index is 25.14 kg/m.  General:  Pleasant, elderly female appearing in no acute distress.  HEENT: normal Neck: no JVD Vascular: No carotid bruits; Distal pulses 2+ bilaterally Cardiac:  normal S1, S2; RRR; no murmur Lungs: mild rales along bases bilaterally.  Abd: soft, nontender, no hepatomegaly  Ext: 1+ pitting edema bilaterally.  Musculoskeletal:  No deformities, BUE and BLE strength normal and equal Skin: warm and dry  Neuro:  CNs 2-12 intact, no focal abnormalities noted Psych:  Normal affect   EKG:  The EKG was personally reviewed and demonstrates: NSR, heart rate 72 with LVH and IVCD. Telemetry:  Telemetry was personally reviewed and demonstrates: NSR, HR in 60's to 70's.   Relevant CV Studies:  Echocardiogram: 06/01/2022 IMPRESSIONS     1. Left ventricular ejection fraction, by estimation, is 45 to 50%. The  left ventricle has mildly decreased function. The left ventricle  demonstrates global hypokinesis. There is mild left ventricular  hypertrophy. Left ventricular diastolic parameters  are consistent with Grade I diastolic dysfunction (impaired relaxation).  Elevated left atrial pressure.   2. Right ventricular systolic function is normal. The right ventricular  size is normal. There is normal pulmonary artery systolic pressure.   3. Left atrial size was severely dilated.   4. The mitral valve is abnormal. Mild mitral valve regurgitation. No  evidence of mitral stenosis.   5. The tricuspid valve is abnormal.   6. The aortic valve is tricuspid. Aortic valve regurgitation is mild. No  aortic stenosis is present.   7. The inferior vena cava is normal in size with greater than 50%  respiratory variability, suggesting right atrial pressure of 3 mmHg.    Laboratory Data:  High Sensitivity Troponin:  No results for input(s): "TROPONINIHS" in the last 720 hours.   Chemistry Recent Labs  Lab 05/31/22 1153 06/01/22 0414 06/02/22 0411  NA 140 138 138  K 3.5 3.0* 4.1   CL 110 107 104  CO2 24 27 27   GLUCOSE 87 75 92  BUN 13 12 12   CREATININE 0.79 0.82 1.00  CALCIUM 8.5* 8.1* 8.4*  MG  --  1.9  --   GFRNONAA >60 >60 54*  ANIONGAP 6 4* 7    Recent Labs  Lab 05/31/22 1153  PROT 5.1*  ALBUMIN 2.8*  AST 8*  ALT 9  ALKPHOS 51  BILITOT 0.5   Lipids No results for input(s): "CHOL", "TRIG", "HDL", "LABVLDL", "LDLCALC", "CHOLHDL" in the last 168 hours.  Hematology Recent Labs  Lab 05/31/22 1153  WBC 5.8  RBC 3.41*  HGB 9.7*  HCT 30.4*  MCV 89.1  MCH 28.4  MCHC 31.9  RDW 20.4*  PLT 402*   Thyroid No results for input(s): "TSH", "FREET4" in the last 168 hours.  BNP Recent Labs  Lab 05/31/22 1153  BNP 1,070.0*    DDimer No results for input(s): "DDIMER" in the last 168 hours.   Radiology/Studies:    CT L-SPINE NO CHARGE  Result Date: 05/31/2022 CLINICAL DATA:  Back pain radiating to the sides EXAM: CT Lumbar Spine without contrast TECHNIQUE: Technique: Multiplanar CT images of the lumbar spine were reconstructed from contemporary CT of the Abdomen and Pelvis. RADIATION DOSE REDUCTION: This exam was performed according to the departmental dose-optimization program which includes automated exposure control, adjustment of the mA and/or kV according  to patient size and/or use of iterative reconstruction technique. CONTRAST:  None or No additional COMPARISON:  PET CT 11/05/2021, MRI 07/07/2012 FINDINGS: Segmentation: 5 lumbar type vertebrae. Alignment: 6 mm anterolisthesis L3 on L4. Vertebrae: No acute fracture or focal pathologic process. Lucent lesion at T11. Paraspinal and other soft tissues: See separately dictated CT abdomen pelvis. Disc levels: At T12-L1, maintained disc space. No canal stenosis. The foramen are patent. At L1-L2, patent disc space. No canal stenosis. The foramen are patent. At L2-L3, shallow left paracentral disc protrusion. No high-grade canal stenosis. Moderate facet degenerative changes. No bony foraminal narrowing. At  L3-L4, moderate disc space narrowing with vacuum disc. At least moderate canal stenosis. Advanced hypertrophic facet degenerative changes. Bilateral foraminal narrowing. At L4-L5, advanced disc space narrowing with endplate sclerosis. Posterior disc osteophyte complex with at least mild canal stenosis. Advanced bilateral facet arthropathy. Bilateral foraminal narrowing. At L5-S1, mild disc space narrowing. No canal stenosis. Advanced facet degenerative changes. No foraminal narrowing. IMPRESSION: 1. No CT evidence for acute osseous abnormality. 2. Grade 1 anterolisthesis L3 on L4 with advanced multilevel degenerative changes, worst at at L3-L4 and L4-L5. Advanced facet degenerative changes at multiple levels. 3. Lucent lesion at T11 potentially related to history of myeloma Electronically Signed   By: Jasmine Pang M.D.   On: 05/31/2022 15:41   CT Abdomen Pelvis W Contrast  Result Date: 05/31/2022 CLINICAL DATA:  Low back pain radiating to the sides EXAM: CT ABDOMEN AND PELVIS WITH CONTRAST TECHNIQUE: Multidetector CT imaging of the abdomen and pelvis was performed using the standard protocol following bolus administration of intravenous contrast. RADIATION DOSE REDUCTION: This exam was performed according to the departmental dose-optimization program which includes automated exposure control, adjustment of the mA and/or kV according to patient size and/or use of iterative reconstruction technique. CONTRAST:  OMNIPAQUE IOHEXOL 300 MG/ML  SOLN COMPARISON:  PET CT 11/05/2021, CT chest 08/05/2021, CT abdomen pelvis 07/04/2012 FINDINGS: Lower chest: Lung bases demonstrate cardiomegaly. Partially visualized central venous catheter tip at the cavoatrial region. Small bilateral pleural effusions. Passive atelectasis at the bases. Hepatobiliary: Slightly heterogeneous liver enhancement without focal mass. Status post cholecystectomy. No biliary dilatation. Pancreas: Unremarkable. No pancreatic ductal dilatation or  surrounding inflammatory changes. Spleen: Splenectomy Adrenals/Urinary Tract: Right adrenal gland is normal. 11 mm nodule in the left adrenal gland, series 2, image 17. Kidneys show no hydronephrosis. Bilateral renal cysts and subcentimeter hypodensities too small to further characterize, no imaging follow-up is recommended. Urinary bladder is unremarkable Stomach/Bowel: Stomach nonenlarged. No dilated small bowel. No acute bowel wall thickening. Diffuse diverticular disease of the colon without acute wall thickening. Vascular/Lymphatic: Advanced aortic atherosclerosis. No aneurysm. No suspicious lymph nodes. Reproductive: Status post hysterectomy. No adnexal masses. Other: No free air. Small volume free fluid in the pelvis. Water density collection adjacent to the inferior margin of the liver measures 4.7 x 3.5 cm, not metabolically active on recent PET CT. Musculoskeletal: No acute osseous abnormality. Lucent lesion at T12 vertebral body, not convincingly seen on previous exams. IMPRESSION: 1. No CT evidence for acute intra-abdominal or pelvic abnormality. 2. Cardiomegaly with small bilateral pleural effusions. Small volume free fluid in the pelvis. 3. Diffuse diverticular disease of the colon without acute wall thickening. 4. Lucent lesion at T12 vertebral body, not convincingly seen on previous exams, and potentially related to the history of myeloma. 5. 11 mm indeterminate left adrenal nodule, not definitively seen on prior, raising concern for metastatic disease. 6. Aortic atherosclerosis. Aortic Atherosclerosis (ICD10-I70.0). Electronically Signed  By: Jasmine PangKim  Fujinaga M.D.   On: 05/31/2022 15:30   DG Chest Portable 1 View  Result Date: 05/31/2022 CLINICAL DATA:  Shortness of breath. EXAM: PORTABLE CHEST 1 VIEW COMPARISON:  August 05, 2021. FINDINGS: Stable cardiomegaly. Mild left pleural effusion is noted with associated left basilar atelectasis or infiltrate. Minimal right basilar subsegmental atelectasis is  noted. Right internal jugular Port-A-Cath is noted with distal tip in expected position of the SVC. Bony thorax is unremarkable. IMPRESSION: Mild left pleural effusion with associated left basilar atelectasis or infiltrate. Electronically Signed   By: Lupita RaiderJames  Green Jr M.D.   On: 05/31/2022 14:48     Assessment and Plan:   1. Acute HFmrEF - Admitted with an acute CHF exacerbation repeat echocardiogram shows her EF is mildly reduced at 45 to 50% and most recent imaging prior to this was in 2017 and EF was normal at that time. - She has a recorded net output of -3.6 L with another liter in her urine canister.  Reports significant improvement in her lower extremity edema and respiratory status since admission. Given her appropriate response and overall stable renal function (creatinine at 1.00 today), would continue with IV Lasix. Will review with Dr. Wyline MoodBranch in regards to starting either [place]Jardiance or Farxiga. Can consider stopping Amlodipine and switching to Losartan or beta-blocker therapy as well but would be cautious with beta-blocker therapy given HR in 60's at times. Given her advanced age and multiple myeloma, would favor gradual medication adjustments.   2. HTN - BP has been at 125/57 - 155/66 in the past 24 hours, at 126/71 on most recent check. Remains on Amlodipine 5mg  daily and can consider additional med changes as discussed above.   3. HLD - She has been continued on PTA Atorvastatin 40mg  daily.   4. Multiple Myeloma - Followed by Oncology as an outpatient.   For questions or updates, please contact [facility] Please consult [email or web address] for contact info under    Signed, Ellsworth LennoxBrittany M Strader, PA-C  06/02/2022 9:19 AM  Attending note Patient seen and discussed with PA Iran OuchStrader, I agree with her documentation. 89 yo female history of PAD, multiple myeloma, HTN, HLD presented with SOB.   K 3.5 BUN 13 Cr 0.79 WBC 5.8 Hgb 9.7 Plt 402 BNP 1070  CXR mild left pleural  effusion EKG SR, LAFB Echo: LVEF 45-50%, grade I dd, normal RV, severe LAE    1.Acute HFmrEF 10/2015 echo LVEF 60-65% 05/2022 Echo: LVEF 45-50%, grade I dd, normal RV, severe LAE - BNP 1070, CXR left pleural effusion.  - negative 1.1 L yesterday, negative 3.6 L since admission. She is on IV lasix 40mg  bid, slight uptrend in Cr but still WNL - remains fluid overloaded, continue IV diuresis today.  - advanced age with multiple myeloma  and very mild systolic dysfunction, no plans for ischemic testing at this time. Plans for medical therapy, repeat echo next few months  - start toprol 12.5mg  daily, farxiga 10mg  daily. Likely low dose ARB and MRA tomorrow. Over time pending response could consider transition to ARNI -review chemo regimen for possible cardiac effects   Dina RichJonathan Corneisha Alvi MD

## 2022-06-02 NOTE — Progress Notes (Signed)
PROGRESS NOTE  Leslie Williamson  NUU:[id number] DOB: 1931-12-27 DOA: 05/31/2022 PCP: Benetta Spar, MD   Brief Narrative: Patient is 89 year old female with history of multiple myeloma, hypertension, hyperlipidemia who presented with back pain.  Pain started about a month ago and was mainly in the lower back radiating to bilateral hip.  Patient also reported nocturnal dyspnea, bilateral lower extremity edema.  On presentation, she was hemodynamically stable.  Lab work showed elevated BNP, she looked volume overloaded.  Started on IV Lasix.  Echo showed diminished EF of 45 to 50%, cardiology consulted and following.  Assessment & Plan:  Principal Problem:   CHF (congestive heart failure) Active Problems:   JAK2+ MDP (myeloproliferative disorder) presenting with thrombocytosis (HCC)   Essential hypertension   Multiple myeloma  Acute on chronic back pain: History of multiple myeloma which could have contributed.  Back pain started since a month.  Imaging showed advanced multilevel degenerative disc disease without any acute findings.  Continue pain management, supportive care.  PT/OT evaluation requested, recommended home health.  Denies any back pain this morning.  Suspected acute on chronic diastolic CHF: Presented with nocturnal dyspnea, edema of the bilateral lower extremities.  Echo on 2017 showed normal EF.  Chest x-ray showed mild left pleural effusion with atelectasis versus infiltrate on the left lower lobe.  BNP elevated.  Started on IV Lasix.  Echo showed EF of 45 to 50%.  Continue Lasix for today.  Continue to monitor daily weight, input/output.  Urology consulted and following  History of multiple myeloma: Follows with Dr. Ellin Saba who was contacted.  As per him, her multiple myeloma is fairly controlled, no need to be seen by oncology has consulted for recommend outpatient follow-up. Imaging showed lucent T12 vertebral body likely associated with history of myeloma.  Also  showed 11 mm adrenal nodule on the left suspicious for metastatic disease.  We recommend follow-up with Dr. Ellin Saba as an outpatient.  No other acute findings on CT abdomen/pelvis. His last chemotherapy was 2 weeks ago.  Hypertension: Will continue to monitor blood pressure.  Amlodipine discontinued  Hyperlipidemia: On Lipitor  Hypokalemia: Supplemented and corrected  Goals of care: Elderly patient with multiple myeloma.  CODE STATUS is DNR.       DVT prophylaxis:enoxaparin (LOVENOX) injection 40 mg Start: 05/31/22 1800     Code Status: DNR  Family Communication: Son at bedside at bedside on 4/10  Patient status: Obs  Patient is from : Home  Anticipated discharge to: Home  Estimated DC date: 1 to 2 days, continue on IV diuretics, needs cardiology clearance before discharge  Consultants: cardiology  Procedures: None  Antimicrobials:  Anti-infectives (From admission, onward)    Start     Dose/Rate Route Frequency Ordered Stop   05/31/22 2200  acyclovir (ZOVIRAX) tablet 400 mg        400 mg Oral 2 times daily 05/31/22 1658         Subjective: Patient seen and examined at bedside today.  Hemodynamically stable.  She feels much better.  She has been weaned off oxygen and she was on room air.  Denies any shortness of breath.  Improvement in the leg edema.  Eager to go home  Objective: Vitals:   06/01/22 1619 06/01/22 2041 06/01/22 2350 06/02/22 0550  BP: 137/61 (!) 127/56 (!) 125/57 126/71  Pulse: 66 70 60 70  Resp: Temp: 98.6 F (37 C) 98.9 F (37.2 C) 98.8 F (37.1 C) 98.7 F (37.1  C)  TempSrc: Oral Oral Oral   SpO2: 99% 93% 95% 97%  Weight:    58.4 kg  Height:        Intake/Output Summary (Last 24 hours) at 06/02/2022 1100 Last data filed at 06/02/2022 0900 Gross per 24 hour  Intake 720 ml  Output 1900 ml  Net -1180 ml   Filed Weights   05/31/22 2005 06/01/22 0500 06/02/22 0550  Weight: 64.9 kg 61 kg 58.4 kg     Examination:  General exam: Overall comfortable, not in distress.  Pleasant elderly female HEENT: PERRL Respiratory system: Mild crackles on the left base Cardiovascular system: S1 & S2 heard, RRR.  Gastrointestinal system: Abdomen is nondistended, soft and nontender. Central nervous system: Alert and oriented Extremities: 1-2+ bilateral pitting pedal edema, no clubbing ,no cyanosis Skin: No rashes, no ulcers,no icterus     Data Reviewed: I have personally reviewed following labs and imaging studies  CBC: Recent Labs  Lab 05/31/22 1153  WBC 5.8  HGB 9.7*  HCT 30.4*  MCV 89.1  PLT 402*   Basic Metabolic Panel: Recent Labs  Lab 05/31/22 1153 06/01/22 0414 06/02/22 0411  NA 140 138 138  K 3.5 3.0* 4.1  CL 110 107 104  CO2 24 27 27   GLUCOSE 87 75 92  BUN 13 12 12   CREATININE 0.79 0.82 1.00  CALCIUM 8.5* 8.1* 8.4*  MG  --  1.9  --      No results found for this or any previous visit (from the past 240 hour(s)).   Radiology Studies: ECHOCARDIOGRAM COMPLETE  Result Date: 06/01/2022    ECHOCARDIOGRAM REPORT   Patient Name:   Leslie Williamson Date of Exam: 06/01/2022 Medical Rec #:  [id number]         Height:       60.0 in Accession #:    [id number]        Weight:       134.5 lb Date of Birth:  11-Nov-1931         BSA:          1.577 m Patient Age:    89 years          BP:           130/76 mmHg Patient Gender: F                 HR:           58 bpm. [facility] Procedure: 2D Echo, Cardiac Doppler and Color Doppler Indications:    CHF-Acute Diastolic I50.31  History:        Patient has prior history of Echocardiogram examinations, most                 recent 10/26/2015. CHF; Risk Factors:Hypertension and                 Dyslipidemia. Multiple myeloma. Hx of COVID-19 virus infection                 and                 NHL (non-Hodgkin's lymphoma).  Sonographer:    Celesta GentileBernard White RCS Referring Phys: [id number] RAVI PAHWANI IMPRESSIONS  1. Left ventricular ejection  fraction, by estimation, is 45 to 50%. The left ventricle has mildly decreased function. The left ventricle demonstrates global hypokinesis. There is mild left ventricular hypertrophy. Left ventricular diastolic parameters are consistent with Grade I diastolic dysfunction (impaired relaxation).  Elevated left atrial pressure.  2. Right ventricular systolic function is normal. The right ventricular size is normal. There is normal pulmonary artery systolic pressure.  3. Left atrial size was severely dilated.  4. The mitral valve is abnormal. Mild mitral valve regurgitation. No evidence of mitral stenosis.  5. The tricuspid valve is abnormal.  6. The aortic valve is tricuspid. Aortic valve regurgitation is mild. No aortic stenosis is present.  7. The inferior vena cava is normal in size with greater than 50% respiratory variability, suggesting right atrial pressure of 3 mmHg. FINDINGS  Left Ventricle: Left ventricular ejection fraction, by estimation, is 45 to 50%. The left ventricle has mildly decreased function. The left ventricle demonstrates global hypokinesis. The left ventricular internal cavity size was normal in size. There is  mild left ventricular hypertrophy. Left ventricular diastolic parameters are consistent with Grade I diastolic dysfunction (impaired relaxation). Elevated left atrial pressure. Right Ventricle: The right ventricular size is normal. Right vetricular wall thickness was not well visualized. Right ventricular systolic function is normal. There is normal pulmonary artery systolic pressure. The tricuspid regurgitant velocity is 2.85 m/s, and with an assumed right atrial pressure of 3 mmHg, the estimated right ventricular systolic pressure is 35.5 mmHg. Left Atrium: Left atrial size was severely dilated. Right Atrium: Right atrial size was normal in size. Pericardium: Trivial pericardial effusion is present. The pericardial effusion is circumferential. Mitral Valve: The mitral valve is abnormal.  Mild mitral valve regurgitation. No evidence of mitral valve stenosis. Tricuspid Valve: The tricuspid valve is abnormal. Tricuspid valve regurgitation is mild . No evidence of tricuspid stenosis. Aortic Valve: The aortic valve is tricuspid. Aortic valve regurgitation is mild. Aortic regurgitation PHT measures 507 msec. No aortic stenosis is present. Aortic valve mean gradient measures 4.6 mmHg. Aortic valve peak gradient measures 9.9 mmHg. Aortic  valve area, by VTI measures 1.82 cm. Pulmonic Valve: The pulmonic valve was not well visualized. Pulmonic valve regurgitation is not visualized. No evidence of pulmonic stenosis. Aorta: The aortic root is normal in size and structure. Venous: The inferior vena cava is normal in size with greater than 50% respiratory variability, suggesting right atrial pressure of 3 mmHg. IAS/Shunts: No atrial level shunt detected by color flow Doppler. Additional Comments: There is a small pleural effusion in the left lateral region.  LEFT VENTRICLE PLAX 2D LVIDd:         4.90 cm   Diastology LVIDs:         3.50 cm   LV e' medial:    2.83 cm/s LV PW:         1.00 cm   LV E/e' medial:  24.7 LV IVS:        1.20 cm   LV e' lateral:   3.92 cm/s LVOT diam:     1.80 cm   LV E/e' lateral: 17.8 LV SV:         57 LV SV Index:   36 LVOT Area:     2.54 cm  RIGHT VENTRICLE RV S prime:     15.00 cm/s TAPSE (M-mode): 2.3 cm LEFT ATRIUM              Index        RIGHT ATRIUM           Index LA diam:        4.30 cm  2.73 cm/m   RA Area:     14.20 cm LA Vol (A2C):   108.0 ml 68.49 ml/m  RA Volume:   31.80 ml  20.17 ml/m LA Vol (A4C):   76.3 ml  48.39 ml/m LA Biplane Vol: 91.2 ml  57.84 ml/m  AORTIC VALVE AV Area (Vmax):    1.61 cm AV Area (Vmean):   1.49 cm AV Area (VTI):     1.82 cm AV Vmax:           157.32 cm/s AV Vmean:          97.628 cm/s AV VTI:            0.313 m AV Peak Grad:      9.9 mmHg AV Mean Grad:      4.6 mmHg LVOT Vmax:         99.45 cm/s LVOT Vmean:        57.250 cm/s LVOT  VTI:          0.224 m LVOT/AV VTI ratio: 0.71 AI PHT:            507 msec  AORTA Ao Root diam: 3.20 cm MITRAL VALVE                  TRICUSPID VALVE MV Area (PHT): 2.99 cm       TR Peak grad:   32.5 mmHg MV Decel Time: 254 msec       TR Vmax:        285.00 cm/s MR Peak grad:    105.3 mmHg MR Mean grad:    66.0 mmHg    SHUNTS MR Vmax:         513.00 cm/s  Systemic VTI:  0.22 m MR Vmean:        380.0 cm/s   Systemic Diam: 1.80 cm MR PISA:         1.27 cm MR PISA Eff ROA: 6 mm MR PISA Radius:  0.45 cm MV E velocity: 69.80 cm/s MV A velocity: 117.00 cm/s MV E/A ratio:  0.60 Dina Rich MD Electronically signed by Dina Rich MD Signature Date/Time: 06/01/2022/12:59:06 PM    Final    CT L-SPINE NO CHARGE  Result Date: 05/31/2022 CLINICAL DATA:  Back pain radiating to the sides EXAM: CT Lumbar Spine without contrast TECHNIQUE: Technique: Multiplanar CT images of the lumbar spine were reconstructed from contemporary CT of the Abdomen and Pelvis. RADIATION DOSE REDUCTION: This exam was performed according to the departmental dose-optimization program which includes automated exposure control, adjustment of the mA and/or kV according to patient size and/or use of iterative reconstruction technique. CONTRAST:  None or No additional COMPARISON:  PET CT 11/05/2021, MRI 07/07/2012 FINDINGS: Segmentation: 5 lumbar type vertebrae. Alignment: 6 mm anterolisthesis L3 on L4. Vertebrae: No acute fracture or focal pathologic process. Lucent lesion at T11. Paraspinal and other soft tissues: See separately dictated CT abdomen pelvis. Disc levels: At T12-L1, maintained disc space. No canal stenosis. The foramen are patent. At L1-L2, patent disc space. No canal stenosis. The foramen are patent. At L2-L3, shallow left paracentral disc protrusion. No high-grade canal stenosis. Moderate facet degenerative changes. No bony foraminal narrowing. At L3-L4, moderate disc space narrowing with vacuum disc. At least moderate canal  stenosis. Advanced hypertrophic facet degenerative changes. Bilateral foraminal narrowing. At L4-L5, advanced disc space narrowing with endplate sclerosis. Posterior disc osteophyte complex with at least mild canal stenosis. Advanced bilateral facet arthropathy. Bilateral foraminal narrowing. At L5-S1, mild disc space narrowing. No canal stenosis. Advanced facet degenerative changes. No foraminal narrowing. IMPRESSION: 1. No CT evidence for acute osseous abnormality. 2. Grade  1 anterolisthesis L3 on L4 with advanced multilevel degenerative changes, worst at at L3-L4 and L4-L5. Advanced facet degenerative changes at multiple levels. 3. Lucent lesion at T11 potentially related to history of myeloma Electronically Signed   By: Jasmine Pang M.D.   On: 05/31/2022 15:41   CT Abdomen Pelvis W Contrast  Result Date: 05/31/2022 CLINICAL DATA:  Low back pain radiating to the sides EXAM: CT ABDOMEN AND PELVIS WITH CONTRAST TECHNIQUE: Multidetector CT imaging of the abdomen and pelvis was performed using the standard protocol following bolus administration of intravenous contrast. RADIATION DOSE REDUCTION: This exam was performed according to the departmental dose-optimization program which includes automated exposure control, adjustment of the mA and/or kV according to patient size and/or use of iterative reconstruction technique. CONTRAST:  OMNIPAQUE IOHEXOL 300 MG/ML  SOLN COMPARISON:  PET CT 11/05/2021, CT chest 08/05/2021, CT abdomen pelvis 07/04/2012 FINDINGS: Lower chest: Lung bases demonstrate cardiomegaly. Partially visualized central venous catheter tip at the cavoatrial region. Small bilateral pleural effusions. Passive atelectasis at the bases. Hepatobiliary: Slightly heterogeneous liver enhancement without focal mass. Status post cholecystectomy. No biliary dilatation. Pancreas: Unremarkable. No pancreatic ductal dilatation or surrounding inflammatory changes. Spleen: Splenectomy Adrenals/Urinary Tract:  Right adrenal gland is normal. 11 mm nodule in the left adrenal gland, series 2, image 17. Kidneys show no hydronephrosis. Bilateral renal cysts and subcentimeter hypodensities too small to further characterize, no imaging follow-up is recommended. Urinary bladder is unremarkable Stomach/Bowel: Stomach nonenlarged. No dilated small bowel. No acute bowel wall thickening. Diffuse diverticular disease of the colon without acute wall thickening. Vascular/Lymphatic: Advanced aortic atherosclerosis. No aneurysm. No suspicious lymph nodes. Reproductive: Status post hysterectomy. No adnexal masses. Other: No free air. Small volume free fluid in the pelvis. Water density collection adjacent to the inferior margin of the liver measures 4.7 x 3.5 cm, not metabolically active on recent PET CT. Musculoskeletal: No acute osseous abnormality. Lucent lesion at T12 vertebral body, not convincingly seen on previous exams. IMPRESSION: 1. No CT evidence for acute intra-abdominal or pelvic abnormality. 2. Cardiomegaly with small bilateral pleural effusions. Small volume free fluid in the pelvis. 3. Diffuse diverticular disease of the colon without acute wall thickening. 4. Lucent lesion at T12 vertebral body, not convincingly seen on previous exams, and potentially related to the history of myeloma. 5. 11 mm indeterminate left adrenal nodule, not definitively seen on prior, raising concern for metastatic disease. 6. Aortic atherosclerosis. Aortic Atherosclerosis (ICD10-I70.0). Electronically Signed   By: Jasmine Pang M.D.   On: 05/31/2022 15:30   DG Chest Portable 1 View  Result Date: 05/31/2022 CLINICAL DATA:  Shortness of breath. EXAM: PORTABLE CHEST 1 VIEW COMPARISON:  August 05, 2021. FINDINGS: Stable cardiomegaly. Mild left pleural effusion is noted with associated left basilar atelectasis or infiltrate. Minimal right basilar subsegmental atelectasis is noted. Right internal jugular Port-A-Cath is noted with distal tip in  expected position of the SVC. Bony thorax is unremarkable. IMPRESSION: Mild left pleural effusion with associated left basilar atelectasis or infiltrate. Electronically Signed   By: Lupita Raider M.D.   On: 05/31/2022 14:48    Scheduled Meds:  acyclovir  400 mg Oral BID   amLODipine  5 mg Oral Daily   aspirin EC  81 mg Oral Daily   atorvastatin  40 mg Oral Daily   Chlorhexidine Gluconate Cloth  6 each Topical Daily   enoxaparin (LOVENOX) injection  40 mg Subcutaneous Q24H   furosemide  40 mg Intravenous BID   polyethylene glycol  17  g Oral Daily   senna-docusate  1 tablet Oral BID   sodium chloride flush  3 mL Intravenous Q12H   Continuous Infusions:  sodium chloride       LOS: 0 days   Burnadette Pop, MD [facility] P4/11/2022, 11:00 AM

## 2022-06-02 NOTE — TOC Benefit Eligibility Note (Signed)
Patient Product/process development scientist completed.    The patient is currently admitted and upon discharge could be taking Farxiga 10 mg.  The current 30 day co-pay is $0.00.   The patient is currently admitted and upon discharge could be taking Jardiance 10 mg.  The current 30 day co-pay is $0.00.   The patient is insured through [facility] Part D   This test claim was processed through [facility]- copay amounts may vary at other pharmacies due to pharmacy/plan contracts, or as the patient moves through the different stages of their insurance plan.  Roland Earl, CPHT Pharmacy Patient Advocate Specialist [facility] Patient Advocate Team Direct Number: [phone]  Fax: [phone]

## 2022-06-02 NOTE — Progress Notes (Signed)
Mobility Specialist Progress Note:    06/02/22 1300  Mobility  Activity Transferred from chair to bed  Level of Assistance Moderate assist, patient does 50-74%  Assistive Device Other (Comment) (HHA)  Distance Ambulated (ft) 4 ft  Activity Response Tolerated well  Mobility Referral Yes  $Mobility charge 1 Mobility   Pt requested assistance C>B d/t discomfort. Required ModA with HHA. Tolerated transfer well, asx throughout. Left pt at EOB with bed alarm on, all needs met.   Feliciana Rossetti Mobility Specialist Please contact via Special educational needs teacher or  Rehab office at [phone]

## 2022-06-03 DIAGNOSIS — I5023 Acute on chronic systolic (congestive) heart failure: Secondary | ICD-10-CM | POA: Diagnosis present

## 2022-06-03 DIAGNOSIS — D471 Chronic myeloproliferative disease: Secondary | ICD-10-CM | POA: Diagnosis present

## 2022-06-03 DIAGNOSIS — C7972 Secondary malignant neoplasm of left adrenal gland: Secondary | ICD-10-CM | POA: Diagnosis present

## 2022-06-03 DIAGNOSIS — Z79899 Other long term (current) drug therapy: Secondary | ICD-10-CM | POA: Diagnosis not present

## 2022-06-03 DIAGNOSIS — C9000 Multiple myeloma not having achieved remission: Secondary | ICD-10-CM | POA: Diagnosis present

## 2022-06-03 DIAGNOSIS — K5730 Diverticulosis of large intestine without perforation or abscess without bleeding: Secondary | ICD-10-CM | POA: Diagnosis present

## 2022-06-03 DIAGNOSIS — D649 Anemia, unspecified: Secondary | ICD-10-CM | POA: Diagnosis present

## 2022-06-03 DIAGNOSIS — J9811 Atelectasis: Secondary | ICD-10-CM | POA: Diagnosis present

## 2022-06-03 DIAGNOSIS — E7800 Pure hypercholesterolemia, unspecified: Secondary | ICD-10-CM | POA: Diagnosis present

## 2022-06-03 DIAGNOSIS — I110 Hypertensive heart disease with heart failure: Secondary | ICD-10-CM | POA: Diagnosis present

## 2022-06-03 DIAGNOSIS — Z7969 Long term (current) use of other immunomodulators and immunosuppressants: Secondary | ICD-10-CM | POA: Diagnosis not present

## 2022-06-03 DIAGNOSIS — G8929 Other chronic pain: Secondary | ICD-10-CM | POA: Diagnosis present

## 2022-06-03 DIAGNOSIS — I5020 Unspecified systolic (congestive) heart failure: Secondary | ICD-10-CM | POA: Diagnosis present

## 2022-06-03 DIAGNOSIS — I5021 Acute systolic (congestive) heart failure: Secondary | ICD-10-CM | POA: Diagnosis not present

## 2022-06-03 DIAGNOSIS — E876 Hypokalemia: Secondary | ICD-10-CM | POA: Diagnosis present

## 2022-06-03 DIAGNOSIS — M549 Dorsalgia, unspecified: Secondary | ICD-10-CM | POA: Diagnosis present

## 2022-06-03 DIAGNOSIS — Z66 Do not resuscitate: Secondary | ICD-10-CM | POA: Diagnosis present

## 2022-06-03 DIAGNOSIS — I739 Peripheral vascular disease, unspecified: Secondary | ICD-10-CM | POA: Diagnosis present

## 2022-06-03 DIAGNOSIS — I509 Heart failure, unspecified: Secondary | ICD-10-CM | POA: Diagnosis present

## 2022-06-03 DIAGNOSIS — Z7982 Long term (current) use of aspirin: Secondary | ICD-10-CM | POA: Diagnosis not present

## 2022-06-03 DIAGNOSIS — M899 Disorder of bone, unspecified: Secondary | ICD-10-CM | POA: Diagnosis present

## 2022-06-03 DIAGNOSIS — I429 Cardiomyopathy, unspecified: Secondary | ICD-10-CM | POA: Diagnosis present

## 2022-06-03 DIAGNOSIS — Z7952 Long term (current) use of systemic steroids: Secondary | ICD-10-CM | POA: Diagnosis not present

## 2022-06-03 DIAGNOSIS — M5136 Other intervertebral disc degeneration, lumbar region: Secondary | ICD-10-CM | POA: Diagnosis present

## 2022-06-03 DIAGNOSIS — Z7961 Long term (current) use of immunomodulator: Secondary | ICD-10-CM | POA: Diagnosis not present

## 2022-06-03 DIAGNOSIS — D75838 Other thrombocytosis: Secondary | ICD-10-CM | POA: Diagnosis present

## 2022-06-03 DIAGNOSIS — Z87891 Personal history of nicotine dependence: Secondary | ICD-10-CM | POA: Diagnosis not present

## 2022-06-03 LAB — BASIC METABOLIC PANEL
Anion gap: 4 — ABNORMAL LOW (ref 5–15)
BUN: 13 mg/dL (ref 8–23)
CO2: 29 mmol/L (ref 22–32)
Calcium: 8.1 mg/dL — ABNORMAL LOW (ref 8.9–10.3)
Chloride: 103 mmol/L (ref 98–111)
Creatinine, Ser: 1.1 mg/dL — ABNORMAL HIGH (ref 0.44–1.00)
GFR, Estimated: 48 mL/min — ABNORMAL LOW (ref >60)
Glucose, Bld: 83 mg/dL (ref 70–99)
Potassium: 3.4 mmol/L — ABNORMAL LOW (ref 3.5–5.1)
Sodium: 136 mmol/L (ref 135–145)

## 2022-06-03 MED ORDER — SPIRONOLACTONE 12.5 MG HALF TABLET
12.5000 mg | ORAL_TABLET | Freq: Every day | ORAL | Status: DC
  Administered 2022-06-03 – 2022-06-04 (×2): 12.5 mg via ORAL
  Filled 2022-06-03 (×2): qty 1

## 2022-06-03 MED ORDER — LOSARTAN POTASSIUM 25 MG PO TABS
12.5000 mg | ORAL_TABLET | Freq: Every day | ORAL | Status: DC
  Administered 2022-06-03 – 2022-06-04 (×2): 12.5 mg via ORAL
  Filled 2022-06-03 (×2): qty 1

## 2022-06-03 MED ORDER — ENOXAPARIN SODIUM 30 MG/0.3ML IJ SOSY
30.0000 mg | PREFILLED_SYRINGE | INTRAMUSCULAR | Status: DC
  Administered 2022-06-03: 30 mg via SUBCUTANEOUS
  Filled 2022-06-03: qty 0.3

## 2022-06-03 MED ORDER — POTASSIUM CHLORIDE CRYS ER 20 MEQ PO TBCR
40.0000 meq | EXTENDED_RELEASE_TABLET | Freq: Once | ORAL | Status: AC
  Administered 2022-06-03: 40 meq via ORAL
  Filled 2022-06-03: qty 2

## 2022-06-03 NOTE — Progress Notes (Signed)
PROGRESS NOTE  Leslie Williamson  ZOX:[id number] DOB: 16-Oct-1931 DOA: 05/31/2022 PCP: Benetta SparFanta, [name] Demissie, MD   Brief Narrative: Patient is 89 year old female with history of multiple myeloma, hypertension, hyperlipidemia who presented with back pain.  Pain started about a month ago and was mainly in the lower back radiating to bilateral hip.  Patient also reported nocturnal dyspnea, bilateral lower extremity edema.  On presentation, she was hemodynamically stable.  Lab work showed elevated BNP, she looked volume overloaded.  Started on IV Lasix.  Echo showed diminished EF of 45 to 50%, cardiology consulted and following.  Assessment & Plan:  Principal Problem:   CHF (congestive heart failure) Active Problems:   JAK2+ MDP (myeloproliferative disorder) presenting with thrombocytosis (HCC)   Essential hypertension   Multiple myeloma  Acute on chronic back pain: History of multiple myeloma which could have contributed.  Back pain started since a month.  Imaging showed advanced multilevel degenerative disc disease without any acute findings.  Continue pain management, supportive care.  PT/OT evaluation requested, recommended home health.  Denies any back pain this morning.  Suspected acute on chronic diastolic CHF: Presented with nocturnal dyspnea, edema of the bilateral lower extremities.  Echo on 2017 showed normal EF.  Chest x-ray showed mild left pleural effusion with atelectasis versus infiltrate on the left lower lobe.  BNP elevated.  Started on IV Lasix.  Echo showed EF of 45 to 50%.  Continue Lasix for today with plan for starting oral diuretics tomorrow.  Continue to monitor daily weight, input/output.  cardiology consulted and following. Lower extremity edema has almost resolved, still has some crackles  History of multiple myeloma: Follows with Dr. Ellin SabaKatragadda who was contacted.  As per him, her multiple myeloma is fairly controlled, no need to be seen by oncology has consulted for  recommend outpatient follow-up. Imaging showed lucent T12 vertebral body likely associated with history of myeloma.  Also showed 11 mm adrenal nodule on the left suspicious for metastatic disease.  We recommend follow-up with Dr. Ellin SabaKatragadda as an outpatient.  No other acute findings on CT abdomen/pelvis. His last chemotherapy was 2 weeks ago.  Hypertension: Will continue to monitor blood pressure.  Added Aldactone, metoprolol, losartan .amlodipine discontinued  Hyperlipidemia: On Lipitor  Hypokalemia: Supplemented and being monitored  Goals of care: Elderly patient with multiple myeloma.  CODE STATUS is DNR.       DVT prophylaxis:enoxaparin (LOVENOX) injection 40 mg Start: 05/31/22 1800     Code Status: DNR  Family Communication: Son at bedside at bedside on 4/10  Patient status: Obs  Patient is from : Home  Anticipated discharge to: Home  Estimated DC date:tomorrow  Consultants: cardiology  Procedures: None  Antimicrobials:  Anti-infectives (From admission, onward)    Start     Dose/Rate Route Frequency Ordered Stop   05/31/22 2200  acyclovir (ZOVIRAX) tablet 400 mg        400 mg Oral 2 times daily 05/31/22 1658         Subjective: Patient seen and examined at bedside today.  Feels much better today.  On room air.  Sitting in the chair.  Lower extremity edema has almost resolved.  Denies any shortness of breath or cough.  Very eager to go home.  Had a bowel movement this morning  Objective: Vitals:   06/01/22 1619 06/01/22 2041 06/01/22 2350 06/02/22 0550  BP: 137/61 (!) 127/56 (!) 125/57 126/71  Pulse: 66 70 60 70  Resp: 20 18 18 18   Temp: 98.6 F (  37 C) 98.9 F (37.2 C) 98.8 F (37.1 C) 98.7 F (37.1 C)  TempSrc: Oral Oral Oral   SpO2: 99% 93% 95% 97%  Weight:    58.4 kg  Height:        Intake/Output Summary (Last 24 hours) at 06/02/2022 1100 Last data filed at 06/02/2022 0900 Gross per 24 hour  Intake 720 ml  Output 1900 ml  Net -1180 ml   Filed  Weights   05/31/22 2005 06/01/22 0500 06/02/22 0550  Weight: 64.9 kg 61 kg 58.4 kg    Examination:  General exam: Overall comfortable, not in distress, pleasant elderly female HEENT: PERRL Respiratory system: Mild bibasilar crackles Cardiovascular system: S1 & S2 heard, RRR.  Chemo-Port on the right chest Gastrointestinal system: Abdomen is nondistended, soft and nontender. Central nervous system: Alert and oriented Extremities: Trace bilateral lower extremity pitting edema, no clubbing ,no cyanosis Skin: No rashes, no ulcers,no icterus     Data Reviewed: I have personally reviewed following labs and imaging studies  CBC: Recent Labs  Lab 05/31/22 1153  WBC 5.8  HGB 9.7*  HCT 30.4*  MCV 89.1  PLT 402*   Basic Metabolic Panel: Recent Labs  Lab 05/31/22 1153 06/01/22 0414 06/02/22 0411  NA 140 138 138  K 3.5 3.0* 4.1  CL 110 107 104  CO2 GLUCOSE 87 75 92  BUN CREATININE 0.79 0.82 1.00  CALCIUM 8.5* 8.1* 8.4*  MG  --  1.9  --      No results found for this or any previous visit (from the past 240 hour(s)).   Radiology Studies: ECHOCARDIOGRAM COMPLETE  Result Date: 06/01/2022    ECHOCARDIOGRAM REPORT   Patient Name:   Leslie Williamson Date of Exam: 06/01/2022 Medical Rec #:  [id number]         Height:       60.0 in Accession #:    [id number]        Weight:       134.5 lb Date of Birth:  12-Mar-1931         BSA:          1.577 m Patient Age:    89 years          BP:           130/76 mmHg Patient Gender: F                 HR:           58 bpm. [facility] Procedure: 2D Echo, Cardiac Doppler and Color Doppler Indications:    CHF-Acute Diastolic I50.31  History:        Patient has prior history of Echocardiogram examinations, most                 recent 10/26/2015. CHF; Risk Factors:Hypertension and                 Dyslipidemia. Multiple myeloma. Hx of COVID-19 virus infection                 and                 NHL (non-Hodgkin's lymphoma).   Sonographer:    Celesta Gentile RCS Referring Phys: [id number] RAVI PAHWANI IMPRESSIONS  1. Left ventricular ejection fraction, by estimation, is 45 to 50%. The left ventricle has mildly decreased function. The left ventricle demonstrates global hypokinesis. There is mild left  ventricular hypertrophy. Left ventricular diastolic parameters are consistent with Grade I diastolic dysfunction (impaired relaxation). Elevated left atrial pressure.  2. Right ventricular systolic function is normal. The right ventricular size is normal. There is normal pulmonary artery systolic pressure.  3. Left atrial size was severely dilated.  4. The mitral valve is abnormal. Mild mitral valve regurgitation. No evidence of mitral stenosis.  5. The tricuspid valve is abnormal.  6. The aortic valve is tricuspid. Aortic valve regurgitation is mild. No aortic stenosis is present.  7. The inferior vena cava is normal in size with greater than 50% respiratory variability, suggesting right atrial pressure of 3 mmHg. FINDINGS  Left Ventricle: Left ventricular ejection fraction, by estimation, is 45 to 50%. The left ventricle has mildly decreased function. The left ventricle demonstrates global hypokinesis. The left ventricular internal cavity size was normal in size. There is  mild left ventricular hypertrophy. Left ventricular diastolic parameters are consistent with Grade I diastolic dysfunction (impaired relaxation). Elevated left atrial pressure. Right Ventricle: The right ventricular size is normal. Right vetricular wall thickness was not well visualized. Right ventricular systolic function is normal. There is normal pulmonary artery systolic pressure. The tricuspid regurgitant velocity is 2.85 m/s, and with an assumed right atrial pressure of 3 mmHg, the estimated right ventricular systolic pressure is 35.5 mmHg. Left Atrium: Left atrial size was severely dilated. Right Atrium: Right atrial size was normal in size. Pericardium: Trivial  pericardial effusion is present. The pericardial effusion is circumferential. Mitral Valve: The mitral valve is abnormal. Mild mitral valve regurgitation. No evidence of mitral valve stenosis. Tricuspid Valve: The tricuspid valve is abnormal. Tricuspid valve regurgitation is mild . No evidence of tricuspid stenosis. Aortic Valve: The aortic valve is tricuspid. Aortic valve regurgitation is mild. Aortic regurgitation PHT measures 507 msec. No aortic stenosis is present. Aortic valve mean gradient measures 4.6 mmHg. Aortic valve peak gradient measures 9.9 mmHg. Aortic  valve area, by VTI measures 1.82 cm. Pulmonic Valve: The pulmonic valve was not well visualized. Pulmonic valve regurgitation is not visualized. No evidence of pulmonic stenosis. Aorta: The aortic root is normal in size and structure. Venous: The inferior vena cava is normal in size with greater than 50% respiratory variability, suggesting right atrial pressure of 3 mmHg. IAS/Shunts: No atrial level shunt detected by color flow Doppler. Additional Comments: There is a small pleural effusion in the left lateral region.  LEFT VENTRICLE PLAX 2D LVIDd:         4.90 cm   Diastology LVIDs:         3.50 cm   LV e' medial:    2.83 cm/s LV PW:         1.00 cm   LV E/e' medial:  24.7 LV IVS:        1.20 cm   LV e' lateral:   3.92 cm/s LVOT diam:     1.80 cm   LV E/e' lateral: 17.8 LV SV:         57 LV SV Index:   36 LVOT Area:     2.54 cm  RIGHT VENTRICLE RV S prime:     15.00 cm/s TAPSE (M-mode): 2.3 cm LEFT ATRIUM              Index        RIGHT ATRIUM           Index LA diam:        4.30 cm  2.73 cm/m   RA Area:  14.20 cm LA Vol (A2C):   108.0 ml 68.49 ml/m  RA Volume:   31.80 ml  20.17 ml/m LA Vol (A4C):   76.3 ml  48.39 ml/m LA Biplane Vol: 91.2 ml  57.84 ml/m  AORTIC VALVE AV Area (Vmax):    1.61 cm AV Area (Vmean):   1.49 cm AV Area (VTI):     1.82 cm AV Vmax:           157.32 cm/s AV Vmean:          97.628 cm/s AV VTI:            0.313 m AV  Peak Grad:      9.9 mmHg AV Mean Grad:      4.6 mmHg LVOT Vmax:         99.45 cm/s LVOT Vmean:        57.250 cm/s LVOT VTI:          0.224 m LVOT/AV VTI ratio: 0.71 AI PHT:            507 msec  AORTA Ao Root diam: 3.20 cm MITRAL VALVE                  TRICUSPID VALVE MV Area (PHT): 2.99 cm       TR Peak grad:   32.5 mmHg MV Decel Time: 254 msec       TR Vmax:        285.00 cm/s MR Peak grad:    105.3 mmHg MR Mean grad:    66.0 mmHg    SHUNTS MR Vmax:         513.00 cm/s  Systemic VTI:  0.22 m MR Vmean:        380.0 cm/s   Systemic Diam: 1.80 cm MR PISA:         1.27 cm MR PISA Eff ROA: 6 mm MR PISA Radius:  0.45 cm MV E velocity: 69.80 cm/s MV A velocity: 117.00 cm/s MV E/A ratio:  0.60 Dina Rich MD Electronically signed by Dina Rich MD Signature Date/Time: 06/01/2022/12:59:06 PM    Final    CT L-SPINE NO CHARGE  Result Date: 05/31/2022 CLINICAL DATA:  Back pain radiating to the sides EXAM: CT Lumbar Spine without contrast TECHNIQUE: Technique: Multiplanar CT images of the lumbar spine were reconstructed from contemporary CT of the Abdomen and Pelvis. RADIATION DOSE REDUCTION: This exam was performed according to the departmental dose-optimization program which includes automated exposure control, adjustment of the mA and/or kV according to patient size and/or use of iterative reconstruction technique. CONTRAST:  None or No additional COMPARISON:  PET CT 11/05/2021, MRI 07/07/2012 FINDINGS: Segmentation: 5 lumbar type vertebrae. Alignment: 6 mm anterolisthesis L3 on L4. Vertebrae: No acute fracture or focal pathologic process. Lucent lesion at T11. Paraspinal and other soft tissues: See separately dictated CT abdomen pelvis. Disc levels: At T12-L1, maintained disc space. No canal stenosis. The foramen are patent. At L1-L2, patent disc space. No canal stenosis. The foramen are patent. At L2-L3, shallow left paracentral disc protrusion. No high-grade canal stenosis. Moderate facet degenerative changes.  No bony foraminal narrowing. At L3-L4, moderate disc space narrowing with vacuum disc. At least moderate canal stenosis. Advanced hypertrophic facet degenerative changes. Bilateral foraminal narrowing. At L4-L5, advanced disc space narrowing with endplate sclerosis. Posterior disc osteophyte complex with at least mild canal stenosis. Advanced bilateral facet arthropathy. Bilateral foraminal narrowing. At L5-S1, mild disc space narrowing. No canal stenosis. Advanced facet degenerative changes. No foraminal  narrowing. IMPRESSION: 1. No CT evidence for acute osseous abnormality. 2. Grade 1 anterolisthesis L3 on L4 with advanced multilevel degenerative changes, worst at at L3-L4 and L4-L5. Advanced facet degenerative changes at multiple levels. 3. Lucent lesion at T11 potentially related to history of myeloma Electronically Signed   By: Jasmine Pang M.D.   On: 05/31/2022 15:41   CT Abdomen Pelvis W Contrast  Result Date: 05/31/2022 CLINICAL DATA:  Low back pain radiating to the sides EXAM: CT ABDOMEN AND PELVIS WITH CONTRAST TECHNIQUE: Multidetector CT imaging of the abdomen and pelvis was performed using the standard protocol following bolus administration of intravenous contrast. RADIATION DOSE REDUCTION: This exam was performed according to the departmental dose-optimization program which includes automated exposure control, adjustment of the mA and/or kV according to patient size and/or use of iterative reconstruction technique. CONTRAST:  OMNIPAQUE IOHEXOL 300 MG/ML  SOLN COMPARISON:  PET CT 11/05/2021, CT chest 08/05/2021, CT abdomen pelvis 07/04/2012 FINDINGS: Lower chest: Lung bases demonstrate cardiomegaly. Partially visualized central venous catheter tip at the cavoatrial region. Small bilateral pleural effusions. Passive atelectasis at the bases. Hepatobiliary: Slightly heterogeneous liver enhancement without focal mass. Status post cholecystectomy. No biliary dilatation. Pancreas: Unremarkable. No  pancreatic ductal dilatation or surrounding inflammatory changes. Spleen: Splenectomy Adrenals/Urinary Tract: Right adrenal gland is normal. 11 mm nodule in the left adrenal gland, series 2, image 17. Kidneys show no hydronephrosis. Bilateral renal cysts and subcentimeter hypodensities too small to further characterize, no imaging follow-up is recommended. Urinary bladder is unremarkable Stomach/Bowel: Stomach nonenlarged. No dilated small bowel. No acute bowel wall thickening. Diffuse diverticular disease of the colon without acute wall thickening. Vascular/Lymphatic: Advanced aortic atherosclerosis. No aneurysm. No suspicious lymph nodes. Reproductive: Status post hysterectomy. No adnexal masses. Other: No free air. Small volume free fluid in the pelvis. Water density collection adjacent to the inferior margin of the liver measures 4.7 x 3.5 cm, not metabolically active on recent PET CT. Musculoskeletal: No acute osseous abnormality. Lucent lesion at T12 vertebral body, not convincingly seen on previous exams. IMPRESSION: 1. No CT evidence for acute intra-abdominal or pelvic abnormality. 2. Cardiomegaly with small bilateral pleural effusions. Small volume free fluid in the pelvis. 3. Diffuse diverticular disease of the colon without acute wall thickening. 4. Lucent lesion at T12 vertebral body, not convincingly seen on previous exams, and potentially related to the history of myeloma. 5. 11 mm indeterminate left adrenal nodule, not definitively seen on prior, raising concern for metastatic disease. 6. Aortic atherosclerosis. Aortic Atherosclerosis (ICD10-I70.0). Electronically Signed   By: Jasmine Pang M.D.   On: 05/31/2022 15:30   DG Chest Portable 1 View  Result Date: 05/31/2022 CLINICAL DATA:  Shortness of breath. EXAM: PORTABLE CHEST 1 VIEW COMPARISON:  August 05, 2021. FINDINGS: Stable cardiomegaly. Mild left pleural effusion is noted with associated left basilar atelectasis or infiltrate. Minimal right  basilar subsegmental atelectasis is noted. Right internal jugular Port-A-Cath is noted with distal tip in expected position of the SVC. Bony thorax is unremarkable. IMPRESSION: Mild left pleural effusion with associated left basilar atelectasis or infiltrate. Electronically Signed   By: Lupita Raider M.D.   On: 05/31/2022 14:48    Scheduled Meds:  acyclovir  400 mg Oral BID   amLODipine  5 mg Oral Daily   aspirin EC  81 mg Oral Daily   atorvastatin  40 mg Oral Daily   Chlorhexidine Gluconate Cloth  6 each Topical Daily   enoxaparin (LOVENOX) injection  40 mg Subcutaneous Q24H  furosemide  40 mg Intravenous BID   polyethylene glycol  17 g Oral Daily   senna-docusate  1 tablet Oral BID   sodium chloride flush  3 mL Intravenous Q12H   Continuous Infusions:  sodium chloride       LOS: 0 days   Burnadette Pop, MD [facility] P4/11/2022, 11:00 AM

## 2022-06-03 NOTE — TOC Progression Note (Signed)
Transition of Care [facility]) - Progression Note    Patient Details  Name: Leslie Williamson MRN: [id number] Date of Birth: [birth year]  [facility] CM/SW Contact  Elliot Gault, LCSW Phone Number: 06/03/2022, 11:48 AM  Clinical Narrative:     TOC following. MD anticipating dc home with [facility] tomorrow. Pt previously discussed [facility] CMS provider options with TOC. Referral sent to [facility] and they accepted.   Will follow up in AM.  Expected Discharge Plan: Home w Home Health Services Barriers to Discharge: Continued Medical Work up  Expected Discharge Plan and Services In-house Referral: Clinical Social Work Discharge Planning Services: CM Consult Post Acute Care Choice: Home Health, Durable Medical Equipment Living arrangements for the past 2 months: Single Family Home                           HH Arranged: PT, RN HH Agency: [facility] Date [facility] Agency Contacted: 06/03/22   Representative spoke with at [facility] Agency: Kandee Keen   Social Determinants of Health (SDOH) Interventions SDOH Screenings   Food Insecurity: No Food Insecurity (05/31/2022)  Housing: Low Risk  (05/31/2022)  Transportation Needs: No Transportation Needs (05/31/2022)  Utilities: Not At Risk (05/31/2022)  Alcohol Screen: Low Risk  (12/31/2019)  Depression (PHQ2-9): Low Risk  (12/31/2019)  Financial Resource Strain: Low Risk  (12/31/2019)  Physical Activity: Inactive (12/31/2019)  Social Connections: Moderately Isolated (12/31/2019)  Stress: No Stress Concern Present (12/31/2019)  Tobacco Use: Medium Risk (05/31/2022)    Readmission Risk Interventions     No data to display

## 2022-06-03 NOTE — Progress Notes (Signed)
Patient slept through the night only got up once but returned to bed. Continued to monitor.

## 2022-06-03 NOTE — Progress Notes (Signed)
Rounding Note    Patient Name: Leslie Williamson Date of Encounter: 06/03/2022  [facility] Cardiologist: New  Subjective   No complaints  Inpatient Medications    Scheduled Meds:  acyclovir  400 mg Oral BID   aspirin EC  81 mg Oral Daily   atorvastatin  40 mg Oral Daily   Chlorhexidine Gluconate Cloth  6 each Topical Daily   dapagliflozin propanediol  10 mg Oral Daily   enoxaparin (LOVENOX) injection  30 mg Subcutaneous Q24H   furosemide  40 mg Intravenous BID   metoprolol succinate  12.5 mg Oral Daily   polyethylene glycol  17 g Oral Daily   potassium chloride  40 mEq Oral Once   senna-docusate  1 tablet Oral BID   sodium chloride flush  3 mL Intravenous Q12H   Continuous Infusions:  sodium chloride     PRN Meds: sodium chloride, acetaminophen, ketorolac, ondansetron (ZOFRAN) IV, oxyCODONE-acetaminophen, sodium chloride flush   Vital Signs    Vitals:   06/02/22 1344 06/02/22 2054 06/03/22 0405 06/03/22 0500  BP: (!) 137/55 (!) 134/57 (!) 146/77   Pulse: 69 76 75   Resp: Temp: 98.8 F (37.1 C) 98.5 F (36.9 C) 98.5 F (36.9 C)   TempSrc: Oral     SpO2: 97% 100% 94%   Weight:    58.8 kg  Height:        Intake/Output Summary (Last 24 hours) at 06/03/2022 0924 Last data filed at 06/03/2022 0500 Gross per 24 hour  Intake 720 ml  Output 1100 ml  Net -380 ml      06/03/2022    5:00 AM 06/02/2022    5:50 AM 06/01/2022    5:00 AM  Last 3 Weights  Weight (lbs) 129 lb 10.1 oz 128 lb 12 oz 134 lb 7.7 oz  Weight (kg) 58.8 kg 58.4 kg 61 kg      Telemetry    SR - Personally Reviewed  ECG    N/a - Personally Reviewed  Physical Exam   GEN: No acute distress.   Neck: No JVD Cardiac: RRR, no murmurs, rubs, or gallops.  Respiratory: Crackles bilateral bases GI: Soft, nontender, non-distended  MS: trace bilateral edema Neuro:  Nonfocal  Psych: Normal affect   Labs    High Sensitivity Troponin:  No results for input(s):  "TROPONINIHS" in the last 720 hours.   Chemistry Recent Labs  Lab 05/31/22 1153 06/01/22 0414 06/02/22 0411 06/03/22 0418  NA 140 138 138 136  K 3.5 3.0* 4.1 3.4*  CL 110 107 104 103  CO2 GLUCOSE 87 75 92 83  BUN CREATININE 0.79 0.82 1.00 1.10*  CALCIUM 8.5* 8.1* 8.4* 8.1*  MG  --  1.9  --   --   PROT 5.1*  --   --   --   ALBUMIN 2.8*  --   --   --   AST 8*  --   --   --   ALT 9  --   --   --   ALKPHOS 51  --   --   --   BILITOT 0.5  --   --   --   GFRNONAA >60 >60 54* 48*  ANIONGAP 6 4* 7 4*    Lipids No results for input(s): "CHOL", "TRIG", "HDL", "LABVLDL", "LDLCALC", "CHOLHDL" in the last 168 hours.  Hematology Recent Labs  Lab 05/31/22 1153  WBC 5.8  RBC 3.41*  HGB 9.7*  HCT 30.4*  MCV 89.1  MCH 28.4  MCHC 31.9  RDW 20.4*  PLT 402*   Thyroid No results for input(s): "TSH", "FREET4" in the last 168 hours.  BNP Recent Labs  Lab 05/31/22 1153  BNP 1,070.0*    DDimer No results for input(s): "DDIMER" in the last 168 hours.   Radiology    ECHOCARDIOGRAM COMPLETE  Result Date: 06/01/2022    ECHOCARDIOGRAM REPORT   Patient Name:   Leslie Williamson Date of Exam: 06/01/2022 Medical Rec #:  [id number]         Height:       60.0 in Accession #:    [id number]        Weight:       134.5 lb Date of Birth:  12-25-1931         BSA:          1.577 m Patient Age:    89 years          BP:           130/76 mmHg Patient Gender: F                 HR:           58 bpm. [facility] Procedure: 2D Echo, Cardiac Doppler and Color Doppler Indications:    CHF-Acute Diastolic I50.31  History:        Patient has prior history of Echocardiogram examinations, most                 recent 10/26/2015. CHF; Risk Factors:Hypertension and                 Dyslipidemia. Multiple myeloma. Hx of COVID-19 virus infection                 and                 NHL (non-Hodgkin's lymphoma).  Sonographer:    Celesta GentileBernard White RCS Referring Phys: [id number] RAVI PAHWANI  IMPRESSIONS  1. Left ventricular ejection fraction, by estimation, is 45 to 50%. The left ventricle has mildly decreased function. The left ventricle demonstrates global hypokinesis. There is mild left ventricular hypertrophy. Left ventricular diastolic parameters are consistent with Grade I diastolic dysfunction (impaired relaxation). Elevated left atrial pressure.  2. Right ventricular systolic function is normal. The right ventricular size is normal. There is normal pulmonary artery systolic pressure.  3. Left atrial size was severely dilated.  4. The mitral valve is abnormal. Mild mitral valve regurgitation. No evidence of mitral stenosis.  5. The tricuspid valve is abnormal.  6. The aortic valve is tricuspid. Aortic valve regurgitation is mild. No aortic stenosis is present.  7. The inferior vena cava is normal in size with greater than 50% respiratory variability, suggesting right atrial pressure of 3 mmHg. FINDINGS  Left Ventricle: Left ventricular ejection fraction, by estimation, is 45 to 50%. The left ventricle has mildly decreased function. The left ventricle demonstrates global hypokinesis. The left ventricular internal cavity size was normal in size. There is  mild left ventricular hypertrophy. Left ventricular diastolic parameters are consistent with Grade I diastolic dysfunction (impaired relaxation). Elevated left atrial pressure. Right Ventricle: The right ventricular size is normal. Right vetricular wall thickness was not well visualized. Right ventricular systolic function is normal. There is normal pulmonary artery systolic pressure. The tricuspid regurgitant velocity is 2.85 m/s, and with an assumed  right atrial pressure of 3 mmHg, the estimated right ventricular systolic pressure is 35.5 mmHg. Left Atrium: Left atrial size was severely dilated. Right Atrium: Right atrial size was normal in size. Pericardium: Trivial pericardial effusion is present. The pericardial effusion is circumferential.  Mitral Valve: The mitral valve is abnormal. Mild mitral valve regurgitation. No evidence of mitral valve stenosis. Tricuspid Valve: The tricuspid valve is abnormal. Tricuspid valve regurgitation is mild . No evidence of tricuspid stenosis. Aortic Valve: The aortic valve is tricuspid. Aortic valve regurgitation is mild. Aortic regurgitation PHT measures 507 msec. No aortic stenosis is present. Aortic valve mean gradient measures 4.6 mmHg. Aortic valve peak gradient measures 9.9 mmHg. Aortic  valve area, by VTI measures 1.82 cm. Pulmonic Valve: The pulmonic valve was not well visualized. Pulmonic valve regurgitation is not visualized. No evidence of pulmonic stenosis. Aorta: The aortic root is normal in size and structure. Venous: The inferior vena cava is normal in size with greater than 50% respiratory variability, suggesting right atrial pressure of 3 mmHg. IAS/Shunts: No atrial level shunt detected by color flow Doppler. Additional Comments: There is a small pleural effusion in the left lateral region.  LEFT VENTRICLE PLAX 2D LVIDd:         4.90 cm   Diastology LVIDs:         3.50 cm   LV e' medial:    2.83 cm/s LV PW:         1.00 cm   LV E/e' medial:  24.7 LV IVS:        1.20 cm   LV e' lateral:   3.92 cm/s LVOT diam:     1.80 cm   LV E/e' lateral: 17.8 LV SV:         57 LV SV Index:   36 LVOT Area:     2.54 cm  RIGHT VENTRICLE RV S prime:     15.00 cm/s TAPSE (M-mode): 2.3 cm LEFT ATRIUM              Index        RIGHT ATRIUM           Index LA diam:        4.30 cm  2.73 cm/m   RA Area:     14.20 cm LA Vol (A2C):   108.0 ml 68.49 ml/m  RA Volume:   31.80 ml  20.17 ml/m LA Vol (A4C):   76.3 ml  48.39 ml/m LA Biplane Vol: 91.2 ml  57.84 ml/m  AORTIC VALVE AV Area (Vmax):    1.61 cm AV Area (Vmean):   1.49 cm AV Area (VTI):     1.82 cm AV Vmax:           157.32 cm/s AV Vmean:          97.628 cm/s AV VTI:            0.313 m AV Peak Grad:      9.9 mmHg AV Mean Grad:      4.6 mmHg LVOT Vmax:         99.45  cm/s LVOT Vmean:        57.250 cm/s LVOT VTI:          0.224 m LVOT/AV VTI ratio: 0.71 AI PHT:            507 msec  AORTA Ao Root diam: 3.20 cm MITRAL VALVE  TRICUSPID VALVE MV Area (PHT): 2.99 cm       TR Peak grad:   32.5 mmHg MV Decel Time: 254 msec       TR Vmax:        285.00 cm/s MR Peak grad:    105.3 mmHg MR Mean grad:    66.0 mmHg    SHUNTS MR Vmax:         513.00 cm/s  Systemic VTI:  0.22 m MR Vmean:        380.0 cm/s   Systemic Diam: 1.80 cm MR PISA:         1.27 cm MR PISA Eff ROA: 6 mm MR PISA Radius:  0.45 cm MV E velocity: 69.80 cm/s MV A velocity: 117.00 cm/s MV E/A ratio:  0.60 Dina Rich MD Electronically signed by Dina Rich MD Signature Date/Time: 06/01/2022/12:59:06 PM    Final     Cardiac Studies    Patient Profile     Leslie Williamson is a 89 y.o. female with a hx of multiple myeloma, history of lymphoma, HTN and HLD who is being seen 06/02/2022 for the evaluation of CHF at the request of Dr. Renford Dills.    Assessment & Plan    1.Acute HFmrEF 10/2015 echo LVEF 60-65% 05/2022 Echo: LVEF 45-50%, grade I dd, normal RV, severe LAE - BNP 1070, CXR left pleural effusion.   - incomplete I/Os, negative at least 3.8 L since admission. She is on IV lasix 40mg  bid, uptrend in Cr. Hold lasix after AM dose. Still some crackles lung bases    - advanced age with multiple myeloma  and very mild systolic dysfunction, no plans for ischemic testing at this time. Plans for medical therapy, repeat echo next few months   - started toprol 12.5mg  daily, farxiga 10mg  daily. Start losartan 12.5mg , aldactone 12.5mg .  Over time pending response could consider transition to ARNI -review chemo regimen for possible cardiac effects  - would monitor bp's and renal function today with the above changes, possibly change to oral diuretic tomorrow with potential d/c      Dina Rich MD  For questions or updates, please contact [facility] Please consult  [email or web address] for contact info under        Signed, Dina Rich, MD  06/03/2022, 9:24 AM

## 2022-06-03 NOTE — Progress Notes (Signed)
Physical Therapy Treatment Patient Details Name: Leslie Williamson MRN: [id number] DOB: [birth year] Today's Date: 06/03/2022   History of Present Illness Leslie Williamson is a 89 y.o. female with medical history significant of multiple myeloma, hypertension, hyperlipidemia to ED with a complaint of back pain.  According the patient, the pain started about a month ago, it is located at lower lumbar spine and radiates to bilateral hip.  No aggravating or relieving factor.  Pain had gotten worse so she came to the ED.  She did have 1 episode of nocturnal dyspnea 2 nights ago which resolved after she sat up and lasted only for few minutes.  She denies any saddle anesthesia, bowel or bladder incontinence.  Or any weakness in bilateral lower extremities.    PT Comments    Pt sitting in chair upon entrance, friendly and willing to participate with therpay today.  Pt able to complete all transfers and gait safely with use of RW for stability with gait.  Main difficulty with slow cadence and fatigue with extended standing, relied on counter bent forward due to fatigue.  No reports of pain through session. EOS pt left in chair with call bell within reach.     Recommendations for follow up therapy are one component of a multi-disciplinary discharge planning process, led by the attending physician.  Recommendations may be updated based on patient status, additional functional criteria and insurance authorization.  Follow Up Recommendations       Assistance Recommended at Discharge    Patient can return home with the following     Equipment Recommendations       Recommendations for Other Services       Precautions / Restrictions Precautions Precautions: Fall Restrictions Weight Bearing Restrictions: No     Mobility  Bed Mobility               General bed mobility comments: seated in chair upon arrival    Transfers Overall transfer level: Independent Equipment used: None Transfers:  Sit to/from Stand Sit to Stand: Supervision           General transfer comment: able to demonstrate safe mechanics with STS with HHA from chair, therapist stabilizing chair    Ambulation/Gait Ambulation/Gait assistance: Min guard Gait Distance (Feet): 20 Feet Assistive device: Rolling walker (2 wheels) Gait Pattern/deviations: Step-through pattern, Decreased stride length       General Gait Details: slow, labored cadence with RW   Stairs             Wheelchair Mobility    Modified Rankin (Stroke Patients Only)       Balance                                            Cognition Arousal/Alertness: Awake/alert Behavior During Therapy: WFL for tasks assessed/performed Overall Cognitive Status: Within Functional Limits for tasks assessed                                          Exercises Other Exercises Other Exercises: standing ADLs complete with min guard for safety    General Comments        Pertinent Vitals/Pain Pain Assessment Pain Assessment: No/denies pain    Home Living  Prior Function            PT Goals (current goals can now be found in the care plan section)      Frequency           PT Plan      Co-evaluation              AM-PAC PT "6 Clicks" Mobility   Outcome Measure  Help needed turning from your back to your side while in a flat bed without using bedrails?: None Help needed moving from lying on your back to sitting on the side of a flat bed without using bedrails?: None Help needed moving to and from a bed to a chair (including a wheelchair)?: None Help needed standing up from a chair using your arms (e.g., wheelchair or bedside chair)?: A Little Help needed to walk in hospital room?: A Little Help needed climbing 3-5 steps with a railing? : A Little 6 Click Score: 21    End of Session Equipment Utilized During Treatment: Gait belt Activity  Tolerance: Patient tolerated treatment well;Patient limited by fatigue Patient left: in chair;with call bell/phone within reach Nurse Communication: Mobility status PT Visit Diagnosis: Unsteadiness on feet (R26.81);Other abnormalities of gait and mobility (R26.89);Muscle weakness (generalized) (M62.81)     Time: 8638-1771 PT Time Calculation (min) (ACUTE ONLY): 33 min  Charges:  $Therapeutic Activity: 8-22 mins                    Becky Sax, LPTA/CLT; CBIS [phone]  Juel Burrow 06/03/2022, 12:47 PM

## 2022-06-04 DIAGNOSIS — I5021 Acute systolic (congestive) heart failure: Secondary | ICD-10-CM | POA: Diagnosis not present

## 2022-06-04 LAB — BASIC METABOLIC PANEL
Anion gap: 6 (ref 5–15)
BUN: 13 mg/dL (ref 8–23)
CO2: 26 mmol/L (ref 22–32)
Calcium: 8.3 mg/dL — ABNORMAL LOW (ref 8.9–10.3)
Chloride: 103 mmol/L (ref 98–111)
Creatinine, Ser: 1.14 mg/dL — ABNORMAL HIGH (ref 0.44–1.00)
GFR, Estimated: 46 mL/min — ABNORMAL LOW (ref >60)
Glucose, Bld: 86 mg/dL (ref 70–99)
Potassium: 3.6 mmol/L (ref 3.5–5.1)
Sodium: 135 mmol/L (ref 135–145)

## 2022-06-04 LAB — CBC
HCT: 33.2 % — ABNORMAL LOW (ref 36.0–46.0)
Hemoglobin: 10.7 g/dL — ABNORMAL LOW (ref 12.0–15.0)
MCH: 27.9 pg (ref 26.0–34.0)
MCHC: 32.2 g/dL (ref 30.0–36.0)
MCV: 86.5 fL (ref 80.0–100.0)
Platelets: 508 10*3/uL — ABNORMAL HIGH (ref 150–400)
RBC: 3.84 MIL/uL — ABNORMAL LOW (ref 3.87–5.11)
RDW: 19.7 % — ABNORMAL HIGH (ref 11.5–15.5)
WBC: 6.1 10*3/uL (ref 4.0–10.5)
nRBC: 0.3 % — ABNORMAL HIGH (ref 0.0–0.2)

## 2022-06-04 MED ORDER — DAPAGLIFLOZIN PROPANEDIOL 10 MG PO TABS: 10.0000 mg | ORAL_TABLET | Freq: Every day | ORAL | 1 refills | Status: DC

## 2022-06-04 MED ORDER — SPIRONOLACTONE 25 MG PO TABS: 12.5000 mg | ORAL_TABLET | Freq: Every day | ORAL | 0 refills | Status: DC

## 2022-06-04 MED ORDER — METOPROLOL SUCCINATE ER 25 MG PO TB24: 12.5000 mg | ORAL_TABLET | Freq: Every day | ORAL | 0 refills | Status: DC

## 2022-06-04 MED ORDER — FUROSEMIDE 40 MG PO TABS: 40.0000 mg | ORAL_TABLET | Freq: Every day | ORAL | Status: DC

## 2022-06-04 MED ORDER — LOSARTAN POTASSIUM 25 MG PO TABS: 12.5000 mg | ORAL_TABLET | Freq: Every day | ORAL | 0 refills | Status: DC

## 2022-06-04 MED ORDER — HEPARIN SOD (PORK) LOCK FLUSH 100 UNIT/ML IV SOLN
500.0000 [IU] | Freq: Once | INTRAVENOUS | Status: DC
  Filled 2022-06-04: qty 5

## 2022-06-04 MED ORDER — FUROSEMIDE 40 MG PO TABS: 40.0000 mg | ORAL_TABLET | Freq: Every day | ORAL | 1 refills | Status: DC

## 2022-06-04 MED ORDER — POTASSIUM CHLORIDE CRYS ER 20 MEQ PO TBCR
20.0000 meq | EXTENDED_RELEASE_TABLET | Freq: Every day | ORAL | 4 refills | Status: DC
Start: 2022-06-04 — End: 2022-06-23

## 2022-06-04 NOTE — Plan of Care (Signed)
  Problem: Education: Goal: Knowledge of General Education information will improve Description: Including pain rating scale, medication(s)/side effects and non-pharmacologic comfort measures Outcome: Adequate for Discharge   Problem: Health Behavior/Discharge Planning: Goal: Ability to manage health-related needs will improve Outcome: Adequate for Discharge   Problem: Clinical Measurements: Goal: Ability to maintain clinical measurements within normal limits will improve Outcome: Adequate for Discharge Goal: Will remain free from infection Outcome: Adequate for Discharge Goal: Diagnostic test results will improve Outcome: Adequate for Discharge Goal: Respiratory complications will improve Outcome: Adequate for Discharge Goal: Cardiovascular complication will be avoided Outcome: Adequate for Discharge   Problem: Activity: Goal: Risk for activity intolerance will decrease Outcome: Adequate for Discharge   Problem: Nutrition: Goal: Adequate nutrition will be maintained Outcome: Adequate for Discharge   Problem: Coping: Goal: Level of anxiety will decrease Outcome: Adequate for Discharge   Problem: Elimination: Goal: Will not experience complications related to bowel motility Outcome: Adequate for Discharge Goal: Will not experience complications related to urinary retention Outcome: Adequate for Discharge   Problem: Pain Managment: Goal: General experience of comfort will improve Outcome: Adequate for Discharge   Problem: Safety: Goal: Ability to remain free from injury will improve Outcome: Adequate for Discharge   Problem: Skin Integrity: Goal: Risk for impaired skin integrity will decrease Outcome: Adequate for Discharge   Problem: Acute Rehab PT Goals(only PT should resolve) Goal: Pt Will Go Supine/Side To Sit Outcome: Adequate for Discharge Goal: Pt Will Go Sit To Supine/Side Outcome: Adequate for Discharge Goal: Patient Will Transfer Sit To/From  Stand Outcome: Adequate for Discharge Goal: Pt Will Transfer Bed To Chair/Chair To Bed Outcome: Adequate for Discharge Goal: Pt Will Ambulate Outcome: Adequate for Discharge Goal: Pt/caregiver will Perform Home Exercise Program Outcome: Adequate for Discharge

## 2022-06-04 NOTE — Discharge Summary (Signed)
Physician Discharge Summary  Leslie Williamson:[id number] DOB: Feb 23, 1931 DOA: 05/31/2022  PCP: Benetta Spar, MD  Admit date: 05/31/2022 Discharge date: 06/04/2022  Admitted From: Home Disposition:  Home  Discharge Condition:Stable CODE STATUS: DNR Diet recommendation: Heart Healthy  Brief/Interim Summary: Patient is 89 year old female with history of multiple myeloma, hypertension, hyperlipidemia who presented with back pain. Pain started about a month ago and was mainly in the lower back radiating to bilateral hip. Patient also reported nocturnal dyspnea, bilateral lower extremity edema. On presentation, she was hemodynamically stable. Lab work showed elevated BNP, she looked volume overloaded. Started on IV Lasix. Echo showed diminished EF of 45 to 50%, cardiology consulted.  Started on Lasix with good diuresis.  Almost euvolemic today.  Cardiology cleared for discharge.  Heart failure medications optimized.  She will be followed by cardiology outpatient.  Following problems were addressed during the hospitalization:  Acute on chronic back pain: History of multiple myeloma which could have contributed.  Back pain started since a month.  Imaging showed advanced multilevel degenerative disc disease without any acute findings. PT/OT evaluation requested, recommended home health.  Denies any back pain this morning.   Suspected acute on chronic diastolic CHF: Presented with nocturnal dyspnea, edema of the bilateral lower extremities.  Echo on 2017 showed normal EF.  Chest x-ray showed mild left pleural effusion with atelectasis versus infiltrate on the left lower lobe.  BNP elevated.  Started on IV Lasix.  Echo showed EF of 45 to 50%.   cardiology consulted and following. Had good diuresis during this hospitalization.  Currently following.  Continue on oral Lasix, spironolactone, Toprol, losartan, farxiga   History of multiple myeloma: Follows with Dr. Ellin Saba who was contacted.  As  per him, her multiple myeloma is fairly controlled, no need to be seen by oncology has consulted for recommend outpatient follow-up. Imaging showed lucent T12 vertebral body likely associated with history of myeloma.  Also showed 11 mm adrenal nodule on the left suspicious for metastatic disease.  We recommend follow-up with Dr. Ellin Saba as an outpatient.  No other acute findings on CT abdomen/pelvis. His last chemotherapy was 2 weeks ago.   Hypertension: Currently normotensive.  Continue current medications.   Hyperlipidemia: On Lipitor   Hypokalemia: Supplemented   Goals of care: Elderly patient with multiple myeloma.  CODE STATUS is DNR.     Discharge Diagnoses:  Principal Problem:   CHF (congestive heart failure) Active Problems:   JAK2+ MDP (myeloproliferative disorder) presenting with thrombocytosis (HCC)   Essential hypertension   Multiple myeloma   Systolic CHF    Discharge Instructions  Discharge Instructions     Diet - low sodium heart healthy   Complete by: As directed    Discharge instructions   Complete by: As directed    1)Please take prescribed medications and started 2)Be careful about your water and salt intake at home.  Monitor your weight at home 3)You have an appointment with cardiology on 07/02/2022.  You will be called for appointment 4)Follow up with your PCP in a week.  Do a BMP test to check your kidney function and potassium during the follow-up   Increase activity slowly   Complete by: As directed       Allergies as of 06/04/2022       Reactions   Penicillins Rash        Medication List     STOP taking these medications    amLODipine 5 MG tablet Commonly known as: NORVASC  baclofen 10 MG tablet Commonly known as: LIORESAL   busPIRone 5 MG tablet Commonly known as: BUSPAR   cilostazol 100 MG tablet Commonly known as: PLETAL   ferrous sulfate 325 (65 FE) MG EC tablet   lidocaine-prilocaine cream Commonly known as: EMLA    prochlorperazine 5 MG tablet Commonly known as: COMPAZINE   tiZANidine 2 MG tablet Commonly known as: ZANAFLEX   traMADol 50 MG tablet Commonly known as: ULTRAM   traZODone 50 MG tablet Commonly known as: DESYREL       TAKE these medications    acyclovir 400 MG tablet Commonly known as: ZOVIRAX Take 1 tablet (400 mg total) by mouth 2 (two) times daily.   aspirin 81 MG tablet Take 81 mg by mouth daily.   atorvastatin 40 MG tablet Commonly known as: LIPITOR Take 40 mg by mouth daily.   dapagliflozin propanediol 10 MG Tabs tablet Commonly known as: FARXIGA Take 1 tablet (10 mg total) by mouth daily. Start taking on: June 05, 2022   dexamethasone 4 MG tablet Commonly known as: DECADRON Take 20 mg (5 tablets) by mouth weekly every Wednesday morning   furosemide 40 MG tablet Commonly known as: LASIX Take 1 tablet (40 mg total) by mouth daily. Start taking on: June 05, 2022 What changed:  medication strength how much to take when to take this reasons to take this   ketorolac 10 MG tablet Commonly known as: TORADOL Take 1 tablet (10 mg total) by mouth every 6 (six) hours as needed.   lenalidomide 15 MG capsule Commonly known as: REVLIMID Take 1 capsule (15 mg total) by mouth daily. Take for 21 days on, 7 days off.   losartan 25 MG tablet Commonly known as: COZAAR Take 0.5 tablets (12.5 mg total) by mouth daily. Start taking on: June 05, 2022   metoprolol succinate 25 MG 24 hr tablet Commonly known as: TOPROL-XL Take 0.5 tablets (12.5 mg total) by mouth daily. Start taking on: June 05, 2022   potassium chloride SA 20 MEQ tablet Commonly known as: KLOR-CON M Take 1 tablet (20 mEq total) by mouth daily. What changed: how much to take   spironolactone 25 MG tablet Commonly known as: ALDACTONE Take 0.5 tablets (12.5 mg total) by mouth daily. Start taking on: June 05, 2022               Durable Medical Equipment  (From admission, onward)            Start     Ordered   06/01/22 1610  For home use only DME Walker rolling  Once       Question Answer Comment  Walker: With 5 Inch Wheels   Patient needs a walker to treat with the following condition Abnormality of gait and mobility      06/01/22 0916            Follow-up Information     Ellsworth Lennox, PA-C Follow up on 07/02/2022.   Specialties: Cardiology, Cardiology Why: [facility] Follow-up on 07/02/2022 at 2:30 PM. Contact information: [address] [place] [address] [phone]         Benetta Spar, MD. Schedule an appointment as soon as possible for a visit in 1 week(s).   Specialty: Internal Medicine Contact information: [address] [place] [address] [phone]                Allergies  Allergen Reactions   Penicillins Rash  Consultations: Cardiology   Procedures/Studies: ECHOCARDIOGRAM COMPLETE  Result Date: 06/01/2022    ECHOCARDIOGRAM REPORT   Patient Name:   Leslie Williamson Date of Exam: 06/01/2022 Medical Rec #:  [id number]         Height:       60.0 in Accession #:    [id number]        Weight:       134.5 lb Date of Birth:  February 19, 1932         BSA:          1.577 m Patient Age:    89 years          BP:           130/76 mmHg Patient Gender: F                 HR:           58 bpm. [facility] Procedure: 2D Echo, Cardiac Doppler and Color Doppler Indications:    CHF-Acute Diastolic I50.31  History:        Patient has prior history of Echocardiogram examinations, most                 recent 10/26/2015. CHF; Risk Factors:Hypertension and                 Dyslipidemia. Multiple myeloma. Hx of COVID-19 virus infection                 and                 NHL (non-Hodgkin's lymphoma).  Sonographer:    Celesta Gentile RCS Referring Phys: [id number] RAVI PAHWANI IMPRESSIONS  1. Left ventricular ejection fraction, by estimation, is 45 to 50%. The left ventricle has mildly decreased function. The  left ventricle demonstrates global hypokinesis. There is mild left ventricular hypertrophy. Left ventricular diastolic parameters are consistent with Grade I diastolic dysfunction (impaired relaxation). Elevated left atrial pressure.  2. Right ventricular systolic function is normal. The right ventricular size is normal. There is normal pulmonary artery systolic pressure.  3. Left atrial size was severely dilated.  4. The mitral valve is abnormal. Mild mitral valve regurgitation. No evidence of mitral stenosis.  5. The tricuspid valve is abnormal.  6. The aortic valve is tricuspid. Aortic valve regurgitation is mild. No aortic stenosis is present.  7. The inferior vena cava is normal in size with greater than 50% respiratory variability, suggesting right atrial pressure of 3 mmHg. FINDINGS  Left Ventricle: Left ventricular ejection fraction, by estimation, is 45 to 50%. The left ventricle has mildly decreased function. The left ventricle demonstrates global hypokinesis. The left ventricular internal cavity size was normal in size. There is  mild left ventricular hypertrophy. Left ventricular diastolic parameters are consistent with Grade I diastolic dysfunction (impaired relaxation). Elevated left atrial pressure. Right Ventricle: The right ventricular size is normal. Right vetricular wall thickness was not well visualized. Right ventricular systolic function is normal. There is normal pulmonary artery systolic pressure. The tricuspid regurgitant velocity is 2.85 m/s, and with an assumed right atrial pressure of 3 mmHg, the estimated right ventricular systolic pressure is 35.5 mmHg. Left Atrium: Left atrial size was severely dilated. Right Atrium: Right atrial size was normal in size. Pericardium: Trivial pericardial effusion is present. The pericardial effusion is circumferential. Mitral Valve: The mitral valve is abnormal. Mild mitral valve regurgitation. No evidence of mitral valve stenosis. Tricuspid Valve: The  tricuspid valve  is abnormal. Tricuspid valve regurgitation is mild . No evidence of tricuspid stenosis. Aortic Valve: The aortic valve is tricuspid. Aortic valve regurgitation is mild. Aortic regurgitation PHT measures 507 msec. No aortic stenosis is present. Aortic valve mean gradient measures 4.6 mmHg. Aortic valve peak gradient measures 9.9 mmHg. Aortic  valve area, by VTI measures 1.82 cm. Pulmonic Valve: The pulmonic valve was not well visualized. Pulmonic valve regurgitation is not visualized. No evidence of pulmonic stenosis. Aorta: The aortic root is normal in size and structure. Venous: The inferior vena cava is normal in size with greater than 50% respiratory variability, suggesting right atrial pressure of 3 mmHg. IAS/Shunts: No atrial level shunt detected by color flow Doppler. Additional Comments: There is a small pleural effusion in the left lateral region.  LEFT VENTRICLE PLAX 2D LVIDd:         4.90 cm   Diastology LVIDs:         3.50 cm   LV e' medial:    2.83 cm/s LV PW:         1.00 cm   LV E/e' medial:  24.7 LV IVS:        1.20 cm   LV e' lateral:   3.92 cm/s LVOT diam:     1.80 cm   LV E/e' lateral: 17.8 LV SV:         57 LV SV Index:   36 LVOT Area:     2.54 cm  RIGHT VENTRICLE RV S prime:     15.00 cm/s TAPSE (M-mode): 2.3 cm LEFT ATRIUM              Index        RIGHT ATRIUM           Index LA diam:        4.30 cm  2.73 cm/m   RA Area:     14.20 cm LA Vol (A2C):   108.0 ml 68.49 ml/m  RA Volume:   31.80 ml  20.17 ml/m LA Vol (A4C):   76.3 ml  48.39 ml/m LA Biplane Vol: 91.2 ml  57.84 ml/m  AORTIC VALVE AV Area (Vmax):    1.61 cm AV Area (Vmean):   1.49 cm AV Area (VTI):     1.82 cm AV Vmax:           157.32 cm/s AV Vmean:          97.628 cm/s AV VTI:            0.313 m AV Peak Grad:      9.9 mmHg AV Mean Grad:      4.6 mmHg LVOT Vmax:         99.45 cm/s LVOT Vmean:        57.250 cm/s LVOT VTI:          0.224 m LVOT/AV VTI ratio: 0.71 AI PHT:            507 msec  AORTA Ao Root diam:  3.20 cm MITRAL VALVE                  TRICUSPID VALVE MV Area (PHT): 2.99 cm       TR Peak grad:   32.5 mmHg MV Decel Time: 254 msec       TR Vmax:        285.00 cm/s MR Peak grad:    105.3 mmHg MR Mean grad:    66.0 mmHg    SHUNTS MR Vmax:  513.00 cm/s  Systemic VTI:  0.22 m MR Vmean:        380.0 cm/s   Systemic Diam: 1.80 cm MR PISA:         1.27 cm MR PISA Eff ROA: 6 mm MR PISA Radius:  0.45 cm MV E velocity: 69.80 cm/s MV A velocity: 117.00 cm/s MV E/A ratio:  0.60 Dina Rich MD Electronically signed by Dina Rich MD Signature Date/Time: 06/01/2022/12:59:06 PM    Final    CT L-SPINE NO CHARGE  Result Date: 05/31/2022 CLINICAL DATA:  Back pain radiating to the sides EXAM: CT Lumbar Spine without contrast TECHNIQUE: Technique: Multiplanar CT images of the lumbar spine were reconstructed from contemporary CT of the Abdomen and Pelvis. RADIATION DOSE REDUCTION: This exam was performed according to the departmental dose-optimization program which includes automated exposure control, adjustment of the mA and/or kV according to patient size and/or use of iterative reconstruction technique. CONTRAST:  None or No additional COMPARISON:  PET CT 11/05/2021, MRI 07/07/2012 FINDINGS: Segmentation: 5 lumbar type vertebrae. Alignment: 6 mm anterolisthesis L3 on L4. Vertebrae: No acute fracture or focal pathologic process. Lucent lesion at T11. Paraspinal and other soft tissues: See separately dictated CT abdomen pelvis. Disc levels: At T12-L1, maintained disc space. No canal stenosis. The foramen are patent. At L1-L2, patent disc space. No canal stenosis. The foramen are patent. At L2-L3, shallow left paracentral disc protrusion. No high-grade canal stenosis. Moderate facet degenerative changes. No bony foraminal narrowing. At L3-L4, moderate disc space narrowing with vacuum disc. At least moderate canal stenosis. Advanced hypertrophic facet degenerative changes. Bilateral foraminal narrowing. At L4-L5,  advanced disc space narrowing with endplate sclerosis. Posterior disc osteophyte complex with at least mild canal stenosis. Advanced bilateral facet arthropathy. Bilateral foraminal narrowing. At L5-S1, mild disc space narrowing. No canal stenosis. Advanced facet degenerative changes. No foraminal narrowing. IMPRESSION: 1. No CT evidence for acute osseous abnormality. 2. Grade 1 anterolisthesis L3 on L4 with advanced multilevel degenerative changes, worst at at L3-L4 and L4-L5. Advanced facet degenerative changes at multiple levels. 3. Lucent lesion at T11 potentially related to history of myeloma Electronically Signed   By: Jasmine Pang M.D.   On: 05/31/2022 15:41   CT Abdomen Pelvis W Contrast  Result Date: 05/31/2022 CLINICAL DATA:  Low back pain radiating to the sides EXAM: CT ABDOMEN AND PELVIS WITH CONTRAST TECHNIQUE: Multidetector CT imaging of the abdomen and pelvis was performed using the standard protocol following bolus administration of intravenous contrast. RADIATION DOSE REDUCTION: This exam was performed according to the departmental dose-optimization program which includes automated exposure control, adjustment of the mA and/or kV according to patient size and/or use of iterative reconstruction technique. CONTRAST:  OMNIPAQUE IOHEXOL 300 MG/ML  SOLN COMPARISON:  PET CT 11/05/2021, CT chest 08/05/2021, CT abdomen pelvis 07/04/2012 FINDINGS: Lower chest: Lung bases demonstrate cardiomegaly. Partially visualized central venous catheter tip at the cavoatrial region. Small bilateral pleural effusions. Passive atelectasis at the bases. Hepatobiliary: Slightly heterogeneous liver enhancement without focal mass. Status post cholecystectomy. No biliary dilatation. Pancreas: Unremarkable. No pancreatic ductal dilatation or surrounding inflammatory changes. Spleen: Splenectomy Adrenals/Urinary Tract: Right adrenal gland is normal. 11 mm nodule in the left adrenal gland, series 2, image 17. Kidneys  show no hydronephrosis. Bilateral renal cysts and subcentimeter hypodensities too small to further characterize, no imaging follow-up is recommended. Urinary bladder is unremarkable Stomach/Bowel: Stomach nonenlarged. No dilated small bowel. No acute bowel wall thickening. Diffuse diverticular disease of the colon without acute wall thickening.  Vascular/Lymphatic: Advanced aortic atherosclerosis. No aneurysm. No suspicious lymph nodes. Reproductive: Status post hysterectomy. No adnexal masses. Other: No free air. Small volume free fluid in the pelvis. Water density collection adjacent to the inferior margin of the liver measures 4.7 x 3.5 cm, not metabolically active on recent PET CT. Musculoskeletal: No acute osseous abnormality. Lucent lesion at T12 vertebral body, not convincingly seen on previous exams. IMPRESSION: 1. No CT evidence for acute intra-abdominal or pelvic abnormality. 2. Cardiomegaly with small bilateral pleural effusions. Small volume free fluid in the pelvis. 3. Diffuse diverticular disease of the colon without acute wall thickening. 4. Lucent lesion at T12 vertebral body, not convincingly seen on previous exams, and potentially related to the history of myeloma. 5. 11 mm indeterminate left adrenal nodule, not definitively seen on prior, raising concern for metastatic disease. 6. Aortic atherosclerosis. Aortic Atherosclerosis (ICD10-I70.0). Electronically Signed   By: Jasmine Pang M.D.   On: 05/31/2022 15:30   DG Chest Portable 1 View  Result Date: 05/31/2022 CLINICAL DATA:  Shortness of breath. EXAM: PORTABLE CHEST 1 VIEW COMPARISON:  August 05, 2021. FINDINGS: Stable cardiomegaly. Mild left pleural effusion is noted with associated left basilar atelectasis or infiltrate. Minimal right basilar subsegmental atelectasis is noted. Right internal jugular Port-A-Cath is noted with distal tip in expected position of the SVC. Bony thorax is unremarkable. IMPRESSION: Mild left pleural effusion with  associated left basilar atelectasis or infiltrate. Electronically Signed   By: Lupita Raider M.D.   On: 05/31/2022 14:48      Subjective: Patient seen and examined at bedside today.  Hemodynamically stable.  Sitting in the chair.  Very comfortable.  On room air.  No leg edema today.  Lungs are clear to auscultation.  Very eager to go home.  I called the son and discussed about discharge planning today  Discharge Exam: Vitals:   06/04/22 0417 06/04/22 0744  BP: 130/65 (!) 140/66  Pulse: 65 65  Resp: 18   Temp: 98.7 F (37.1 C) 98.8 F (37.1 C)  SpO2: 93% 97%   Vitals:   06/03/22 2236 06/04/22 0417 06/04/22 0500 06/04/22 0744  BP: 129/66 130/65  (!) 140/66  Pulse: (!) 59 65  65  Resp: 18 18    Temp: 98.2 F (36.8 C) 98.7 F (37.1 C)  98.8 F (37.1 C)  TempSrc:    Oral  SpO2: 95% 93%  97%  Weight:   56.4 kg   Height:        General: Pt is alert, awake, not in acute distress Cardiovascular: RRR, S1/S2 +, no rubs, no gallops Respiratory: CTA bilaterally, no wheezing, no rhonchi Abdominal: Soft, NT, ND, bowel sounds + Extremities: no edema, no cyanosis    The results of significant diagnostics from this hospitalization (including imaging, microbiology, ancillary and laboratory) are listed below for reference.     Microbiology: No results found for this or any previous visit (from the past 240 hour(s)).   Labs: BNP (last 3 results) Recent Labs    05/31/22 1153  BNP 1,070.0*   Basic Metabolic Panel: Recent Labs  Lab 05/31/22 1153 06/01/22 0414 06/02/22 0411 06/03/22 0418 06/04/22 0357  NA 140 138 138 136 135  K 3.5 3.0* 4.1 3.4* 3.6  CL 110 107 104 103 103  CO2 24 27 27 29 26   GLUCOSE 87 75 92 83 86  BUN 13 12 12 13 13   CREATININE 0.79 0.82 1.00 1.10* 1.14*  CALCIUM 8.5* 8.1* 8.4* 8.1* 8.3*  MG  --  1.9  --   --   --    Liver Function Tests: Recent Labs  Lab 05/31/22 1153  AST 8*  ALT 9  ALKPHOS 51  BILITOT 0.5  PROT 5.1*  ALBUMIN 2.8*    Recent Labs  Lab 05/31/22 1153  LIPASE 28   No results for input(s): "AMMONIA" in the last 168 hours. CBC: Recent Labs  Lab 05/31/22 1153 06/04/22 0357  WBC 5.8 6.1  HGB 9.7* 10.7*  HCT 30.4* 33.2*  MCV 89.1 86.5  PLT 402* 508*   Cardiac Enzymes: No results for input(s): "CKTOTAL", "CKMB", "CKMBINDEX", "TROPONINI" in the last 168 hours. BNP: Invalid input(s): "POCBNP" CBG: No results for input(s): "GLUCAP" in the last 168 hours. D-Dimer No results for input(s): "DDIMER" in the last 72 hours. Hgb A1c No results for input(s): "HGBA1C" in the last 72 hours. Lipid Profile No results for input(s): "CHOL", "HDL", "LDLCALC", "TRIG", "CHOLHDL", "LDLDIRECT" in the last 72 hours. Thyroid function studies No results for input(s): "TSH", "T4TOTAL", "T3FREE", "THYROIDAB" in the last 72 hours.  Invalid input(s): "FREET3" Anemia work up No results for input(s): "VITAMINB12", "FOLATE", "FERRITIN", "TIBC", "IRON", "RETICCTPCT" in the last 72 hours. Urinalysis    Component Value Date/Time   COLORURINE STRAW (A) 05/31/2022 1733   APPEARANCEUR CLEAR 05/31/2022 1733   LABSPEC 1.021 05/31/2022 1733   PHURINE 5.0 05/31/2022 1733   GLUCOSEU NEGATIVE 05/31/2022 1733   HGBUR NEGATIVE 05/31/2022 1733   HGBUR large 06/19/2007 0845   BILIRUBINUR NEGATIVE 05/31/2022 1733   BILIRUBINUR negative 08/05/2021 1334   KETONESUR NEGATIVE 05/31/2022 1733   PROTEINUR NEGATIVE 05/31/2022 1733   UROBILINOGEN 0.2 08/05/2021 1334   UROBILINOGEN 0.2 07/03/2012 1929   NITRITE NEGATIVE 05/31/2022 1733   LEUKOCYTESUR NEGATIVE 05/31/2022 1733   Sepsis Labs Recent Labs  Lab 05/31/22 1153 06/04/22 0357  WBC 5.8 6.1   Microbiology No results found for this or any previous visit (from the past 240 hour(s)).  Please note: You were cared for by a hospitalist during your hospital stay. Once you are discharged, your primary care physician will handle any further medical issues. Please note that NO  REFILLS for any discharge medications will be authorized once you are discharged, as it is imperative that you return to your primary care physician (or establish a relationship with a primary care physician if you do not have one) for your post hospital discharge needs so that they can reassess your need for medications and monitor your lab values.    Time coordinating discharge: 40 minutes  SIGNED:   Burnadette Pop, MD  [facility] 06/04/2022, 11:03 AM Pager [phone]  If 7PM-7AM, please contact night-coverage [email or web address] Password [email or web address]

## 2022-06-04 NOTE — Progress Notes (Signed)
Patient slept through the night, no complaints of pain. Continued to monitor.

## 2022-06-04 NOTE — Progress Notes (Signed)
Rounding Note    Patient Name: Leslie Williamson Date of Encounter: 06/04/2022  [facility] Cardiologist: New  Subjective   No complaints  Inpatient Medications    Scheduled Meds:  acyclovir  400 mg Oral BID   aspirin EC  81 mg Oral Daily   atorvastatin  40 mg Oral Daily   Chlorhexidine Gluconate Cloth  6 each Topical Daily   dapagliflozin propanediol  10 mg Oral Daily   enoxaparin (LOVENOX) injection  30 mg Subcutaneous Q24H   losartan  12.5 mg Oral Daily   metoprolol succinate  12.5 mg Oral Daily   polyethylene glycol  17 g Oral Daily   senna-docusate  1 tablet Oral BID   sodium chloride flush  3 mL Intravenous Q12H   spironolactone  12.5 mg Oral Daily   Continuous Infusions:  sodium chloride     PRN Meds: sodium chloride, acetaminophen, ondansetron (ZOFRAN) IV, oxyCODONE-acetaminophen, sodium chloride flush   Vital Signs    Vitals:   06/03/22 2236 06/04/22 0417 06/04/22 0500 06/04/22 0744  BP: 129/66 130/65  (!) 140/66  Pulse: (!) 59 65  65  Resp: 18 18    Temp: 98.2 F (36.8 C) 98.7 F (37.1 C)  98.8 F (37.1 C)  TempSrc:    Oral  SpO2: 95% 93%  97%  Weight:   56.4 kg   Height:        Intake/Output Summary (Last 24 hours) at 06/04/2022 0937 Last data filed at 06/04/2022 0500 Gross per 24 hour  Intake 600 ml  Output 1000 ml  Net -400 ml      06/04/2022    5:00 AM 06/03/2022    5:00 AM 06/02/2022    5:50 AM  Last 3 Weights  Weight (lbs) 124 lb 5.4 oz 129 lb 10.1 oz 128 lb 12 oz  Weight (kg) 56.4 kg 58.8 kg 58.4 kg      Telemetry    SR - Personally Reviewed  ECG    N/a - Personally Reviewed  Physical Exam   GEN: No acute distress.   Neck: No JVD Cardiac: RRR, no murmurs, rubs, or gallops.  Respiratory: Clear to auscultation bilaterally. GI: Soft, nontender, non-distended  MS: No edema; No deformity. Neuro:  Nonfocal  Psych: Normal affect   Labs    High Sensitivity Troponin:  No results for input(s): "TROPONINIHS" in  the last 720 hours.   Chemistry Recent Labs  Lab 05/31/22 1153 06/01/22 0414 06/02/22 0411 06/03/22 0418 06/04/22 0357  NA 140 138 138 136 135  K 3.5 3.0* 4.1 3.4* 3.6  CL 110 107 104 103 103  CO2 24 27 27 29 26   GLUCOSE 87 75 92 83 86  BUN 13 12 12 13 13   CREATININE 0.79 0.82 1.00 1.10* 1.14*  CALCIUM 8.5* 8.1* 8.4* 8.1* 8.3*  MG  --  1.9  --   --   --   PROT 5.1*  --   --   --   --   ALBUMIN 2.8*  --   --   --   --   AST 8*  --   --   --   --   ALT 9  --   --   --   --   ALKPHOS 51  --   --   --   --   BILITOT 0.5  --   --   --   --   GFRNONAA >60 >60 54* 48* 46*  ANIONGAP 6  4* 7 4* 6    Lipids No results for input(s): "CHOL", "TRIG", "HDL", "LABVLDL", "LDLCALC", "CHOLHDL" in the last 168 hours.  Hematology Recent Labs  Lab 05/31/22 1153 06/04/22 0357  WBC 5.8 6.1  RBC 3.41* 3.84*  HGB 9.7* 10.7*  HCT 30.4* 33.2*  MCV 89.1 86.5  MCH 28.4 27.9  MCHC 31.9 32.2  RDW 20.4* 19.7*  PLT 402* 508*   Thyroid No results for input(s): "TSH", "FREET4" in the last 168 hours.  BNP Recent Labs  Lab 05/31/22 1153  BNP 1,070.0*    DDimer No results for input(s): "DDIMER" in the last 168 hours.   Radiology    No results found.  Cardiac Studies    Patient Profile     Leslie Williamson is a 89 y.o. female with a hx of multiple myeloma, history of lymphoma, HTN and HLD who is being seen 06/02/2022 for the evaluation of CHF at the request of Dr. Renford Dills.   Assessment & Plan    1.Acute HFmrEF 10/2015 echo LVEF 60-65% 05/2022 Echo: LVEF 45-50%, grade I dd, normal RV, severe LAE - BNP 1070, CXR left pleural effusion.   - incomplete I/Os this admit. Yesterday recordered neg , negative roughly 4.2 L since admission. Received IV lasix 40mg  x 1 yesterday, PM dose held due to rising Cr. Slight further rise in Cr this AM.  -transition to oral lasix 40mg  daily starting tomorrow.      - advanced age with multiple myeloma  and very mild systolic dysfunction, no plans for  ischemic testing at this time. Plans for medical therapy, repeat echo next few months   - started toprol 12.5mg  daily, farxiga 10mg  daily, losartan 12.5mg , aldactone 12.5mg .  Over time pending response could consider transition to ARNI    She has been on lenalidomide,  Daratumumab neither of which I see a strong association with cardiomyopathy.    Ok for discharge, we will arrange outpatient f/u. We will sign off inpatient care.  For questions or updates, please contact [facility] Please consult [email or web address] for contact info under        Signed, Dina Rich, MD  06/04/2022, 9:37 AM

## 2022-06-04 NOTE — TOC Transition Note (Signed)
Transition of Care [facility]) - CM/SW Discharge Note   Patient Details  Name: Leslie Williamson MRN: [id number] Date of Birth: 10-13-1931  Transition of Care [facility]) CM/SW Contact:  Elliot Gault, LCSW Phone Number: 06/04/2022, 11:15 AM   Clinical Narrative:     Pt stable for dc home today with [facility] as planned. Updated Cory at [facility]. They will follow up once pt at home.  No other TOC needs identified for dc.  Final next level of care: Home w Home Health Services Barriers to Discharge: Barriers Resolved   Patient Goals and CMS Choice CMS Medicare.gov Compare Post Acute Care list provided to:: Patient Represenative (must comment) Choice offered to / list presented to : Patient, Adult Children  Discharge Placement                         Discharge Plan and Services Additional resources added to the After Visit Summary for   In-house Referral: Clinical Social Work Discharge Planning Services: CM Consult Post Acute Care Choice: Home Health, Durable Medical Equipment                    HH Arranged: PT, RN [facility] Agency: [facility] Date [facility] Agency Contacted: 06/03/22   Representative spoke with at [facility] Agency: Kandee Keen  Social Determinants of Health (SDOH) Interventions SDOH Screenings   Food Insecurity: No Food Insecurity (05/31/2022)  Housing: Low Risk  (05/31/2022)  Transportation Needs: No Transportation Needs (05/31/2022)  Utilities: Not At Risk (05/31/2022)  Alcohol Screen: Low Risk  (12/31/2019)  Depression (PHQ2-9): Low Risk  (12/31/2019)  Financial Resource Strain: Low Risk  (12/31/2019)  Physical Activity: Inactive (12/31/2019)  Social Connections: Moderately Isolated (12/31/2019)  Stress: No Stress Concern Present (12/31/2019)  Tobacco Use: Medium Risk (05/31/2022)     Readmission Risk Interventions     No data to display

## 2022-06-05 DIAGNOSIS — M519 Unspecified thoracic, thoracolumbar and lumbosacral intervertebral disc disorder: Secondary | ICD-10-CM | POA: Diagnosis not present

## 2022-06-05 DIAGNOSIS — Z7982 Long term (current) use of aspirin: Secondary | ICD-10-CM | POA: Diagnosis not present

## 2022-06-05 DIAGNOSIS — I110 Hypertensive heart disease with heart failure: Secondary | ICD-10-CM | POA: Diagnosis not present

## 2022-06-05 DIAGNOSIS — D75839 Thrombocytosis, unspecified: Secondary | ICD-10-CM | POA: Diagnosis not present

## 2022-06-05 DIAGNOSIS — G8929 Other chronic pain: Secondary | ICD-10-CM | POA: Diagnosis not present

## 2022-06-05 DIAGNOSIS — E278 Other specified disorders of adrenal gland: Secondary | ICD-10-CM | POA: Diagnosis not present

## 2022-06-05 DIAGNOSIS — Z7984 Long term (current) use of oral hypoglycemic drugs: Secondary | ICD-10-CM | POA: Diagnosis not present

## 2022-06-05 DIAGNOSIS — D471 Chronic myeloproliferative disease: Secondary | ICD-10-CM | POA: Diagnosis not present

## 2022-06-05 DIAGNOSIS — C9000 Multiple myeloma not having achieved remission: Secondary | ICD-10-CM | POA: Diagnosis not present

## 2022-06-05 DIAGNOSIS — Z9181 History of falling: Secondary | ICD-10-CM | POA: Diagnosis not present

## 2022-06-05 DIAGNOSIS — E785 Hyperlipidemia, unspecified: Secondary | ICD-10-CM | POA: Diagnosis not present

## 2022-06-05 DIAGNOSIS — I5022 Chronic systolic (congestive) heart failure: Secondary | ICD-10-CM | POA: Diagnosis not present

## 2022-06-05 DIAGNOSIS — Z79899 Other long term (current) drug therapy: Secondary | ICD-10-CM | POA: Diagnosis not present

## 2022-06-05 DIAGNOSIS — E876 Hypokalemia: Secondary | ICD-10-CM | POA: Diagnosis not present

## 2022-06-07 NOTE — Progress Notes (Signed)
Notification received that patient and son came up to the [facility] on Friday stating that patient had not taken her medications as prescribed by Dr. Ellin Saba due to hospitalization. Patient and son question how and when to restart Revlimid and Acyclovir. Patient instructed to restart both per Dr. Ellin Saba. Specifically for Revlimid, patient has been instructed to take the remainder of her pills (19) and then take a full 7 days off prior to starting her next cycle. Patient verbalized understanding.

## 2022-06-08 DIAGNOSIS — E278 Other specified disorders of adrenal gland: Secondary | ICD-10-CM | POA: Diagnosis not present

## 2022-06-08 DIAGNOSIS — C9000 Multiple myeloma not having achieved remission: Secondary | ICD-10-CM | POA: Diagnosis not present

## 2022-06-08 DIAGNOSIS — E876 Hypokalemia: Secondary | ICD-10-CM | POA: Diagnosis not present

## 2022-06-08 DIAGNOSIS — D75839 Thrombocytosis, unspecified: Secondary | ICD-10-CM | POA: Diagnosis not present

## 2022-06-08 DIAGNOSIS — E785 Hyperlipidemia, unspecified: Secondary | ICD-10-CM | POA: Diagnosis not present

## 2022-06-08 DIAGNOSIS — Z7982 Long term (current) use of aspirin: Secondary | ICD-10-CM | POA: Diagnosis not present

## 2022-06-08 DIAGNOSIS — I110 Hypertensive heart disease with heart failure: Secondary | ICD-10-CM | POA: Diagnosis not present

## 2022-06-08 DIAGNOSIS — D471 Chronic myeloproliferative disease: Secondary | ICD-10-CM | POA: Diagnosis not present

## 2022-06-08 DIAGNOSIS — Z79899 Other long term (current) drug therapy: Secondary | ICD-10-CM | POA: Diagnosis not present

## 2022-06-08 DIAGNOSIS — I5022 Chronic systolic (congestive) heart failure: Secondary | ICD-10-CM | POA: Diagnosis not present

## 2022-06-08 DIAGNOSIS — Z9181 History of falling: Secondary | ICD-10-CM | POA: Diagnosis not present

## 2022-06-08 DIAGNOSIS — G8929 Other chronic pain: Secondary | ICD-10-CM | POA: Diagnosis not present

## 2022-06-08 DIAGNOSIS — Z7984 Long term (current) use of oral hypoglycemic drugs: Secondary | ICD-10-CM | POA: Diagnosis not present

## 2022-06-08 DIAGNOSIS — M519 Unspecified thoracic, thoracolumbar and lumbosacral intervertebral disc disorder: Secondary | ICD-10-CM | POA: Diagnosis not present

## 2022-06-10 DIAGNOSIS — I5022 Chronic systolic (congestive) heart failure: Secondary | ICD-10-CM | POA: Diagnosis not present

## 2022-06-10 DIAGNOSIS — Z7982 Long term (current) use of aspirin: Secondary | ICD-10-CM | POA: Diagnosis not present

## 2022-06-10 DIAGNOSIS — E785 Hyperlipidemia, unspecified: Secondary | ICD-10-CM | POA: Diagnosis not present

## 2022-06-10 DIAGNOSIS — Z9181 History of falling: Secondary | ICD-10-CM | POA: Diagnosis not present

## 2022-06-10 DIAGNOSIS — M519 Unspecified thoracic, thoracolumbar and lumbosacral intervertebral disc disorder: Secondary | ICD-10-CM | POA: Diagnosis not present

## 2022-06-10 DIAGNOSIS — C9000 Multiple myeloma not having achieved remission: Secondary | ICD-10-CM | POA: Diagnosis not present

## 2022-06-10 DIAGNOSIS — D471 Chronic myeloproliferative disease: Secondary | ICD-10-CM | POA: Diagnosis not present

## 2022-06-10 DIAGNOSIS — D75839 Thrombocytosis, unspecified: Secondary | ICD-10-CM | POA: Diagnosis not present

## 2022-06-10 DIAGNOSIS — Z79899 Other long term (current) drug therapy: Secondary | ICD-10-CM | POA: Diagnosis not present

## 2022-06-10 DIAGNOSIS — E876 Hypokalemia: Secondary | ICD-10-CM | POA: Diagnosis not present

## 2022-06-10 DIAGNOSIS — Z7984 Long term (current) use of oral hypoglycemic drugs: Secondary | ICD-10-CM | POA: Diagnosis not present

## 2022-06-10 DIAGNOSIS — E278 Other specified disorders of adrenal gland: Secondary | ICD-10-CM | POA: Diagnosis not present

## 2022-06-10 DIAGNOSIS — I110 Hypertensive heart disease with heart failure: Secondary | ICD-10-CM | POA: Diagnosis not present

## 2022-06-10 DIAGNOSIS — G8929 Other chronic pain: Secondary | ICD-10-CM | POA: Diagnosis not present

## 2022-06-14 ENCOUNTER — Other Ambulatory Visit: Payer: Self-pay

## 2022-06-14 DIAGNOSIS — E785 Hyperlipidemia, unspecified: Secondary | ICD-10-CM | POA: Diagnosis not present

## 2022-06-14 DIAGNOSIS — D471 Chronic myeloproliferative disease: Secondary | ICD-10-CM | POA: Diagnosis not present

## 2022-06-14 DIAGNOSIS — Z79899 Other long term (current) drug therapy: Secondary | ICD-10-CM | POA: Diagnosis not present

## 2022-06-14 DIAGNOSIS — G8929 Other chronic pain: Secondary | ICD-10-CM | POA: Diagnosis not present

## 2022-06-14 DIAGNOSIS — I110 Hypertensive heart disease with heart failure: Secondary | ICD-10-CM | POA: Diagnosis not present

## 2022-06-14 DIAGNOSIS — Z9181 History of falling: Secondary | ICD-10-CM | POA: Diagnosis not present

## 2022-06-14 DIAGNOSIS — C9000 Multiple myeloma not having achieved remission: Secondary | ICD-10-CM | POA: Diagnosis not present

## 2022-06-14 DIAGNOSIS — D75839 Thrombocytosis, unspecified: Secondary | ICD-10-CM | POA: Diagnosis not present

## 2022-06-14 DIAGNOSIS — M519 Unspecified thoracic, thoracolumbar and lumbosacral intervertebral disc disorder: Secondary | ICD-10-CM | POA: Diagnosis not present

## 2022-06-14 DIAGNOSIS — E278 Other specified disorders of adrenal gland: Secondary | ICD-10-CM | POA: Diagnosis not present

## 2022-06-14 DIAGNOSIS — E876 Hypokalemia: Secondary | ICD-10-CM | POA: Diagnosis not present

## 2022-06-14 DIAGNOSIS — Z7982 Long term (current) use of aspirin: Secondary | ICD-10-CM | POA: Diagnosis not present

## 2022-06-14 DIAGNOSIS — Z7984 Long term (current) use of oral hypoglycemic drugs: Secondary | ICD-10-CM | POA: Diagnosis not present

## 2022-06-14 DIAGNOSIS — I5022 Chronic systolic (congestive) heart failure: Secondary | ICD-10-CM | POA: Diagnosis not present

## 2022-06-14 MED ORDER — LENALIDOMIDE 15 MG PO CAPS: 15.0000 mg | ORAL_CAPSULE | Freq: Every day | ORAL | 0 refills | Status: DC

## 2022-06-14 NOTE — Telephone Encounter (Signed)
Chart reviewed. Revlimid refilled per last office note with Dr. Katragadda.  

## 2022-06-15 ENCOUNTER — Other Ambulatory Visit: Payer: Self-pay

## 2022-06-16 ENCOUNTER — Inpatient Hospital Stay: Payer: Medicare Other

## 2022-06-16 ENCOUNTER — Inpatient Hospital Stay: Payer: Medicare Other | Attending: Hematology

## 2022-06-16 ENCOUNTER — Ambulatory Visit: Payer: Medicare Other | Admitting: Hematology

## 2022-06-16 VITALS — Wt 128.2 lb

## 2022-06-16 VITALS — BP 122/55 | HR 66 | Temp 96.9°F | Resp 18

## 2022-06-16 DIAGNOSIS — C9000 Multiple myeloma not having achieved remission: Secondary | ICD-10-CM | POA: Insufficient documentation

## 2022-06-16 DIAGNOSIS — Z5112 Encounter for antineoplastic immunotherapy: Secondary | ICD-10-CM | POA: Insufficient documentation

## 2022-06-16 DIAGNOSIS — Z95828 Presence of other vascular implants and grafts: Secondary | ICD-10-CM

## 2022-06-16 LAB — CBC WITH DIFFERENTIAL/PLATELET
Abs Immature Granulocytes: 0.1 10*3/uL — ABNORMAL HIGH (ref 0.00–0.07)
Basophils Absolute: 0 10*3/uL (ref 0.0–0.1)
Basophils Relative: 0 %
Eosinophils Absolute: 0.1 10*3/uL (ref 0.0–0.5)
Eosinophils Relative: 1 %
HCT: 34.8 % — ABNORMAL LOW (ref 36.0–46.0)
Hemoglobin: 11.3 g/dL — ABNORMAL LOW (ref 12.0–15.0)
Immature Granulocytes: 1 %
Lymphocytes Relative: 13 %
Lymphs Abs: 1.1 10*3/uL (ref 0.7–4.0)
MCH: 27.8 pg (ref 26.0–34.0)
MCHC: 32.5 g/dL (ref 30.0–36.0)
MCV: 85.5 fL (ref 80.0–100.0)
Monocytes Absolute: 0.4 10*3/uL (ref 0.1–1.0)
Monocytes Relative: 4 %
Neutro Abs: 7.2 10*3/uL (ref 1.7–7.7)
Neutrophils Relative %: 81 %
Platelets: 332 10*3/uL (ref 150–400)
RBC: 4.07 MIL/uL (ref 3.87–5.11)
RDW: 18.9 % — ABNORMAL HIGH (ref 11.5–15.5)
WBC: 8.9 10*3/uL (ref 4.0–10.5)
nRBC: 0 % (ref 0.0–0.2)

## 2022-06-16 LAB — COMPREHENSIVE METABOLIC PANEL
ALT: 13 U/L (ref 0–44)
AST: 12 U/L — ABNORMAL LOW (ref 15–41)
Albumin: 3.2 g/dL — ABNORMAL LOW (ref 3.5–5.0)
Alkaline Phosphatase: 62 U/L (ref 38–126)
Anion gap: 8 (ref 5–15)
BUN: 19 mg/dL (ref 8–23)
CO2: 23 mmol/L (ref 22–32)
Calcium: 8.9 mg/dL (ref 8.9–10.3)
Chloride: 106 mmol/L (ref 98–111)
Creatinine, Ser: 1.29 mg/dL — ABNORMAL HIGH (ref 0.44–1.00)
GFR, Estimated: 40 mL/min — ABNORMAL LOW (ref >60)
Glucose, Bld: 106 mg/dL — ABNORMAL HIGH (ref 70–99)
Potassium: 3.9 mmol/L (ref 3.5–5.1)
Sodium: 137 mmol/L (ref 135–145)
Total Bilirubin: 0.7 mg/dL (ref 0.3–1.2)
Total Protein: 5.9 g/dL — ABNORMAL LOW (ref 6.5–8.1)

## 2022-06-16 LAB — MAGNESIUM: Magnesium: 2 mg/dL (ref 1.7–2.4)

## 2022-06-16 MED ORDER — HEPARIN SOD (PORK) LOCK FLUSH 100 UNIT/ML IV SOLN
500.0000 [IU] | Freq: Once | INTRAVENOUS | Status: AC
  Administered 2022-06-16: 500 [IU] via INTRAVENOUS

## 2022-06-16 MED ORDER — SODIUM CHLORIDE 0.9% FLUSH
10.0000 mL | INTRAVENOUS | Status: DC | PRN
  Administered 2022-06-16: 10 mL via INTRAVENOUS

## 2022-06-16 MED ORDER — CETIRIZINE HCL 10 MG PO TABS
10.0000 mg | ORAL_TABLET | Freq: Once | ORAL | Status: AC
  Administered 2022-06-16: 10 mg via ORAL
  Filled 2022-06-16: qty 1

## 2022-06-16 MED ORDER — DARATUMUMAB-HYALURONIDASE-FIHJ 1800-30000 MG-UT/15ML ~~LOC~~ SOLN
1800.0000 mg | Freq: Once | SUBCUTANEOUS | Status: AC
  Administered 2022-06-16: 1800 mg via SUBCUTANEOUS
  Filled 2022-06-16: qty 15

## 2022-06-16 MED ORDER — SODIUM CHLORIDE 0.9% FLUSH
10.0000 mL | Freq: Once | INTRAVENOUS | Status: AC
  Administered 2022-06-16: 10 mL via INTRAVENOUS

## 2022-06-16 MED ORDER — ACETAMINOPHEN 325 MG PO TABS
650.0000 mg | ORAL_TABLET | Freq: Once | ORAL | Status: AC
  Administered 2022-06-16: 650 mg via ORAL
  Filled 2022-06-16: qty 2

## 2022-06-16 NOTE — Patient Instructions (Signed)
[facility]  Discharge Instructions: Thank you for choosing [facility] to provide your oncology and hematology care.  If you have a lab appointment with the Cancer Center - please note that after April 8th, 2024, all labs will be drawn in the cancer center.  You do not have to check in or register with the main entrance as you have in the past but will complete your check-in in the cancer center.  Wear comfortable clothing and clothing appropriate for easy access to any Portacath or PICC line.   We strive to give you quality time with your provider. You may need to reschedule your appointment if you arrive late (15 or more minutes).  Arriving late affects you and other patients whose appointments are after yours.  Also, if you miss three or more appointments without notifying the office, you may be dismissed from the clinic at the provider's discretion.      For prescription refill requests, have your pharmacy contact our office and allow 72 hours for refills to be completed.    Today you received the following chemotherapy and/or immunotherapy agents Leslie Williamson   To help prevent nausea and vomiting after your treatment, we encourage you to take your nausea medication as directed.  Daratumumab; Hyaluronidase Injection What is this medication? DARATUMUMAB; HYALURONIDASE (dar a toom ue mab; hye al ur ON i dase) treats multiple myeloma, a type of bone marrow cancer. Daratumumab works by blocking a protein that causes cancer cells to grow and multiply. This helps to slow or stop the spread of cancer cells. Hyaluronidase works by increasing the absorption of other medications in the body to help them work better. This medication may also be used treat amyloidosis, a condition that causes the buildup of a protein (amyloid) in your body. It works by reducing the buildup of this protein, which decreases symptoms. It is a combination medication that contains a monoclonal  antibody. This medicine may be used for other purposes; ask your health care provider or pharmacist if you have questions. COMMON BRAND NAME(S): DARZALEX FASPRO What should I tell my care team before I take this medication? They need to know if you have any of these conditions: Heart disease Infection, such as chickenpox, cold sores, herpes, hepatitis B Lung or breathing disease An unusual or allergic reaction to daratumumab, hyaluronidase, other medications, foods, dyes, or preservatives Pregnant or trying to get pregnant Breast-feeding How should I use this medication? This medication is injected under the skin. It is given by your care team in a hospital or clinic setting. Talk to your care team about the use of this medication in children. Special care may be needed. Overdosage: If you think you have taken too much of this medicine contact a poison control center or emergency room at once. NOTE: This medicine is only for you. Do not share this medicine with others. What if I miss a dose? Keep appointments for follow-up doses. It is important not to miss your dose. Call your care team if you are unable to keep an appointment. What may interact with this medication? Interactions have not been studied. This list may not describe all possible interactions. Give your health care provider a list of all the medicines, herbs, non-prescription drugs, or dietary supplements you use. Also tell them if you smoke, drink alcohol, or use illegal drugs. Some items may interact with your medicine. What should I watch for while using this medication? Your condition will be monitored  carefully while you are receiving this medication. This medication can cause serious allergic reactions. To reduce your risk, your care team may give you other medication to take before receiving this one. Be sure to follow the directions from your care team. This medication can affect the results of blood tests to match your  blood type. These changes can last for up to 6 months after the final dose. Your care team will do blood tests to match your blood type before you start treatment. Tell all of your care team that you are being treated with this medication before receiving a blood transfusion. This medication can affect the results of some tests used to determine treatment response; extra tests may be needed to evaluate response. Talk to your care team if you wish to become pregnant or think you are pregnant. This medication can cause serious birth defects if taken during pregnancy and for 3 months after the last dose. A reliable form of contraception is recommended while taking this medication and for 3 months after the last dose. Talk to your care team about effective forms of contraception. Do not breast-feed while taking this medication. What side effects may I notice from receiving this medication? Side effects that you should report to your care team as soon as possible: Allergic reactions--skin rash, itching, hives, swelling of the face, lips, tongue, or throat Heart rhythm changes--fast or irregular heartbeat, dizziness, feeling faint or lightheaded, chest pain, trouble breathing Infection--fever, chills, cough, sore throat, wounds that don't heal, pain or trouble when passing urine, general feeling of discomfort or being unwell Infusion reactions--chest pain, shortness of breath or trouble breathing, feeling faint or lightheaded Sudden eye pain or change in vision such as blurry vision, seeing halos around lights, vision loss Unusual bruising or bleeding Side effects that usually do not require medical attention (report to your care team if they continue or are bothersome): Constipation Diarrhea Fatigue Nausea Pain, tingling, or numbness in the hands or feet Swelling of the ankles, hands, or feet This list may not describe all possible side effects. Call your doctor for medical advice about side effects.  You may report side effects to FDA at 1-800-FDA-1088. Where should I keep my medication? This medication is given in a hospital or clinic. It will not be stored at home. NOTE: This sheet is a summary. It may not cover all possible information. If you have questions about this medicine, talk to your doctor, pharmacist, or health care provider.  2023 Elsevier/Gold Standard (2021-06-03 00:00:00)   BELOW ARE SYMPTOMS THAT SHOULD BE REPORTED IMMEDIATELY: *FEVER GREATER THAN 100.4 F (38 C) OR HIGHER *CHILLS OR SWEATING *NAUSEA AND VOMITING THAT IS NOT CONTROLLED WITH YOUR NAUSEA MEDICATION *UNUSUAL SHORTNESS OF BREATH *UNUSUAL BRUISING OR BLEEDING *URINARY PROBLEMS (pain or burning when urinating, or frequent urination) *BOWEL PROBLEMS (unusual diarrhea, constipation, pain near the anus) TENDERNESS IN MOUTH AND THROAT WITH OR WITHOUT PRESENCE OF ULCERS (sore throat, sores in mouth, or a toothache) UNUSUAL RASH, SWELLING OR PAIN  UNUSUAL VAGINAL DISCHARGE OR ITCHING   Items with * indicate a potential emergency and should be followed up as soon as possible or go to the Emergency Department if any problems should occur.  Please show the CHEMOTHERAPY ALERT CARD or IMMUNOTHERAPY ALERT CARD at check-in to the Emergency Department and triage nurse.  Should you have questions after your visit or need to cancel or reschedule your appointment, please contact [facility] [phone]  and follow the prompts.  Office hours are 8:00 a.m. to 4:30 p.m. Monday - Friday. Please note that voicemails left after 4:00 p.m. may not be returned until the following business day.  We are closed weekends and major holidays. You have access to a nurse at all times for urgent questions. Please call the main number to the clinic [phone] and follow the prompts.  For any non-urgent questions, you may also contact your provider using MyChart. We now offer e-Visits for anyone 66 and older to request  care online for non-urgent symptoms. For details visit [email or web address].   Also download the MyChart app! Go to the app store, search "MyChart", open the app, select [facility], and log in with your MyChart username and password.

## 2022-06-16 NOTE — Progress Notes (Signed)
Patient presents today for Dara Rochelle infusion. Patient is in satisfactory condition with no new complaints voiced.  Vital signs are stable.  Labs reviewed and all labs are within treatment parameters.  We will proceed with treatment per MD orders.    Dara Woodburn given today per MD orders. Tolerated infusion without adverse affects. Vital signs stable. No complaints at this time. Discharged from clinic via wheelchair in stable condition. Alert and oriented x 3. F/U with [facility] as scheduled.

## 2022-06-17 DIAGNOSIS — E876 Hypokalemia: Secondary | ICD-10-CM | POA: Diagnosis not present

## 2022-06-17 DIAGNOSIS — I110 Hypertensive heart disease with heart failure: Secondary | ICD-10-CM | POA: Diagnosis not present

## 2022-06-17 DIAGNOSIS — E278 Other specified disorders of adrenal gland: Secondary | ICD-10-CM | POA: Diagnosis not present

## 2022-06-17 DIAGNOSIS — D471 Chronic myeloproliferative disease: Secondary | ICD-10-CM | POA: Diagnosis not present

## 2022-06-17 DIAGNOSIS — G8929 Other chronic pain: Secondary | ICD-10-CM | POA: Diagnosis not present

## 2022-06-17 DIAGNOSIS — D75839 Thrombocytosis, unspecified: Secondary | ICD-10-CM | POA: Diagnosis not present

## 2022-06-17 DIAGNOSIS — Z7984 Long term (current) use of oral hypoglycemic drugs: Secondary | ICD-10-CM | POA: Diagnosis not present

## 2022-06-17 DIAGNOSIS — E785 Hyperlipidemia, unspecified: Secondary | ICD-10-CM | POA: Diagnosis not present

## 2022-06-17 DIAGNOSIS — I5022 Chronic systolic (congestive) heart failure: Secondary | ICD-10-CM | POA: Diagnosis not present

## 2022-06-17 DIAGNOSIS — Z9181 History of falling: Secondary | ICD-10-CM | POA: Diagnosis not present

## 2022-06-17 DIAGNOSIS — C9000 Multiple myeloma not having achieved remission: Secondary | ICD-10-CM | POA: Diagnosis not present

## 2022-06-17 DIAGNOSIS — Z7982 Long term (current) use of aspirin: Secondary | ICD-10-CM | POA: Diagnosis not present

## 2022-06-17 DIAGNOSIS — M519 Unspecified thoracic, thoracolumbar and lumbosacral intervertebral disc disorder: Secondary | ICD-10-CM | POA: Diagnosis not present

## 2022-06-17 DIAGNOSIS — Z79899 Other long term (current) drug therapy: Secondary | ICD-10-CM | POA: Diagnosis not present

## 2022-06-18 DIAGNOSIS — R052 Subacute cough: Secondary | ICD-10-CM | POA: Diagnosis not present

## 2022-06-18 DIAGNOSIS — R609 Edema, unspecified: Secondary | ICD-10-CM | POA: Diagnosis not present

## 2022-06-18 DIAGNOSIS — I10 Essential (primary) hypertension: Secondary | ICD-10-CM | POA: Diagnosis not present

## 2022-06-18 DIAGNOSIS — C884 Extranodal marginal zone B-cell lymphoma of mucosa-associated lymphoid tissue [MALT-lymphoma]: Secondary | ICD-10-CM | POA: Diagnosis not present

## 2022-06-18 DIAGNOSIS — R634 Abnormal weight loss: Secondary | ICD-10-CM | POA: Diagnosis not present

## 2022-06-18 DIAGNOSIS — I5023 Acute on chronic systolic (congestive) heart failure: Secondary | ICD-10-CM | POA: Diagnosis not present

## 2022-06-18 DIAGNOSIS — M549 Dorsalgia, unspecified: Secondary | ICD-10-CM | POA: Diagnosis not present

## 2022-06-18 LAB — PROTEIN ELECTROPHORESIS, SERUM
A/G Ratio: 1.4 (ref 0.7–1.7)
Albumin ELP: 3 g/dL (ref 2.9–4.4)
Alpha-1-Globulin: 0.3 g/dL (ref 0.0–0.4)
Alpha-2-Globulin: 0.6 g/dL (ref 0.4–1.0)
Beta Globulin: 0.9 g/dL (ref 0.7–1.3)
Gamma Globulin: 0.5 g/dL (ref 0.4–1.8)
Globulin, Total: 2.2 g/dL (ref 2.2–3.9)
M-Spike, %: 0.2 g/dL — ABNORMAL HIGH
Total Protein ELP: 5.2 g/dL — ABNORMAL LOW (ref 6.0–8.5)

## 2022-06-18 LAB — KAPPA/LAMBDA LIGHT CHAINS
Kappa free light chain: 50.9 mg/L — ABNORMAL HIGH (ref 3.3–19.4)
Kappa, lambda light chain ratio: 2.35 — ABNORMAL HIGH (ref 0.26–1.65)
Lambda free light chains: 21.7 mg/L (ref 5.7–26.3)

## 2022-06-21 DIAGNOSIS — I5022 Chronic systolic (congestive) heart failure: Secondary | ICD-10-CM | POA: Diagnosis not present

## 2022-06-21 DIAGNOSIS — E785 Hyperlipidemia, unspecified: Secondary | ICD-10-CM | POA: Diagnosis not present

## 2022-06-21 DIAGNOSIS — C9000 Multiple myeloma not having achieved remission: Secondary | ICD-10-CM | POA: Diagnosis not present

## 2022-06-21 DIAGNOSIS — G8929 Other chronic pain: Secondary | ICD-10-CM | POA: Diagnosis not present

## 2022-06-21 DIAGNOSIS — D471 Chronic myeloproliferative disease: Secondary | ICD-10-CM | POA: Diagnosis not present

## 2022-06-21 DIAGNOSIS — E876 Hypokalemia: Secondary | ICD-10-CM | POA: Diagnosis not present

## 2022-06-21 DIAGNOSIS — Z7982 Long term (current) use of aspirin: Secondary | ICD-10-CM | POA: Diagnosis not present

## 2022-06-21 DIAGNOSIS — Z9181 History of falling: Secondary | ICD-10-CM | POA: Diagnosis not present

## 2022-06-21 DIAGNOSIS — Z79899 Other long term (current) drug therapy: Secondary | ICD-10-CM | POA: Diagnosis not present

## 2022-06-21 DIAGNOSIS — E278 Other specified disorders of adrenal gland: Secondary | ICD-10-CM | POA: Diagnosis not present

## 2022-06-21 DIAGNOSIS — I110 Hypertensive heart disease with heart failure: Secondary | ICD-10-CM | POA: Diagnosis not present

## 2022-06-21 DIAGNOSIS — D75839 Thrombocytosis, unspecified: Secondary | ICD-10-CM | POA: Diagnosis not present

## 2022-06-21 DIAGNOSIS — Z7984 Long term (current) use of oral hypoglycemic drugs: Secondary | ICD-10-CM | POA: Diagnosis not present

## 2022-06-21 DIAGNOSIS — M519 Unspecified thoracic, thoracolumbar and lumbosacral intervertebral disc disorder: Secondary | ICD-10-CM | POA: Diagnosis not present

## 2022-06-23 ENCOUNTER — Other Ambulatory Visit: Payer: Self-pay | Admitting: *Deleted

## 2022-06-23 DIAGNOSIS — E876 Hypokalemia: Secondary | ICD-10-CM

## 2022-06-23 MED ORDER — POTASSIUM CHLORIDE CRYS ER 20 MEQ PO TBCR
20.0000 meq | EXTENDED_RELEASE_TABLET | Freq: Every day | ORAL | 4 refills | Status: DC
Start: 2022-06-23 — End: 2022-06-30

## 2022-06-25 DIAGNOSIS — D471 Chronic myeloproliferative disease: Secondary | ICD-10-CM | POA: Diagnosis not present

## 2022-06-25 DIAGNOSIS — Z79899 Other long term (current) drug therapy: Secondary | ICD-10-CM | POA: Diagnosis not present

## 2022-06-25 DIAGNOSIS — Z9181 History of falling: Secondary | ICD-10-CM | POA: Diagnosis not present

## 2022-06-25 DIAGNOSIS — I110 Hypertensive heart disease with heart failure: Secondary | ICD-10-CM | POA: Diagnosis not present

## 2022-06-25 DIAGNOSIS — G8929 Other chronic pain: Secondary | ICD-10-CM | POA: Diagnosis not present

## 2022-06-25 DIAGNOSIS — C9000 Multiple myeloma not having achieved remission: Secondary | ICD-10-CM | POA: Diagnosis not present

## 2022-06-25 DIAGNOSIS — D75839 Thrombocytosis, unspecified: Secondary | ICD-10-CM | POA: Diagnosis not present

## 2022-06-25 DIAGNOSIS — E876 Hypokalemia: Secondary | ICD-10-CM | POA: Diagnosis not present

## 2022-06-25 DIAGNOSIS — Z7982 Long term (current) use of aspirin: Secondary | ICD-10-CM | POA: Diagnosis not present

## 2022-06-25 DIAGNOSIS — Z7984 Long term (current) use of oral hypoglycemic drugs: Secondary | ICD-10-CM | POA: Diagnosis not present

## 2022-06-25 DIAGNOSIS — M519 Unspecified thoracic, thoracolumbar and lumbosacral intervertebral disc disorder: Secondary | ICD-10-CM | POA: Diagnosis not present

## 2022-06-25 DIAGNOSIS — E278 Other specified disorders of adrenal gland: Secondary | ICD-10-CM | POA: Diagnosis not present

## 2022-06-25 DIAGNOSIS — I5022 Chronic systolic (congestive) heart failure: Secondary | ICD-10-CM | POA: Diagnosis not present

## 2022-06-25 DIAGNOSIS — E785 Hyperlipidemia, unspecified: Secondary | ICD-10-CM | POA: Diagnosis not present

## 2022-06-29 DIAGNOSIS — Z79899 Other long term (current) drug therapy: Secondary | ICD-10-CM | POA: Diagnosis not present

## 2022-06-29 DIAGNOSIS — I5022 Chronic systolic (congestive) heart failure: Secondary | ICD-10-CM | POA: Diagnosis not present

## 2022-06-29 DIAGNOSIS — D75839 Thrombocytosis, unspecified: Secondary | ICD-10-CM | POA: Diagnosis not present

## 2022-06-29 DIAGNOSIS — E278 Other specified disorders of adrenal gland: Secondary | ICD-10-CM | POA: Diagnosis not present

## 2022-06-29 DIAGNOSIS — Z9181 History of falling: Secondary | ICD-10-CM | POA: Diagnosis not present

## 2022-06-29 DIAGNOSIS — C9000 Multiple myeloma not having achieved remission: Secondary | ICD-10-CM | POA: Diagnosis not present

## 2022-06-29 DIAGNOSIS — G8929 Other chronic pain: Secondary | ICD-10-CM | POA: Diagnosis not present

## 2022-06-29 DIAGNOSIS — M519 Unspecified thoracic, thoracolumbar and lumbosacral intervertebral disc disorder: Secondary | ICD-10-CM | POA: Diagnosis not present

## 2022-06-29 DIAGNOSIS — E785 Hyperlipidemia, unspecified: Secondary | ICD-10-CM | POA: Diagnosis not present

## 2022-06-29 DIAGNOSIS — I110 Hypertensive heart disease with heart failure: Secondary | ICD-10-CM | POA: Diagnosis not present

## 2022-06-29 DIAGNOSIS — E876 Hypokalemia: Secondary | ICD-10-CM | POA: Diagnosis not present

## 2022-06-29 DIAGNOSIS — Z7982 Long term (current) use of aspirin: Secondary | ICD-10-CM | POA: Diagnosis not present

## 2022-06-29 DIAGNOSIS — Z7984 Long term (current) use of oral hypoglycemic drugs: Secondary | ICD-10-CM | POA: Diagnosis not present

## 2022-06-29 DIAGNOSIS — D471 Chronic myeloproliferative disease: Secondary | ICD-10-CM | POA: Diagnosis not present

## 2022-06-30 ENCOUNTER — Other Ambulatory Visit: Payer: Self-pay

## 2022-06-30 ENCOUNTER — Other Ambulatory Visit: Payer: Self-pay | Admitting: Hematology

## 2022-06-30 DIAGNOSIS — E876 Hypokalemia: Secondary | ICD-10-CM

## 2022-07-02 ENCOUNTER — Inpatient Hospital Stay (HOSPITAL_COMMUNITY)
Admission: EM | Admit: 2022-07-02 | Discharge: 2022-07-05 | DRG: 280 | Disposition: A | Discharge status: Home Health | Payer: Medicare Other | Attending: Internal Medicine | Admitting: Internal Medicine

## 2022-07-02 ENCOUNTER — Encounter (HOSPITAL_COMMUNITY): Payer: Self-pay | Admitting: *Deleted

## 2022-07-02 ENCOUNTER — Other Ambulatory Visit: Payer: Self-pay

## 2022-07-02 ENCOUNTER — Emergency Department (HOSPITAL_COMMUNITY): Payer: Medicare Other

## 2022-07-02 ENCOUNTER — Ambulatory Visit: Payer: Medicare Other | Admitting: Student

## 2022-07-02 DIAGNOSIS — E1151 Type 2 diabetes mellitus with diabetic peripheral angiopathy without gangrene: Secondary | ICD-10-CM | POA: Diagnosis present

## 2022-07-02 DIAGNOSIS — Z7901 Long term (current) use of anticoagulants: Secondary | ICD-10-CM

## 2022-07-02 DIAGNOSIS — I2601 Septic pulmonary embolism with acute cor pulmonale: Secondary | ICD-10-CM | POA: Diagnosis not present

## 2022-07-02 DIAGNOSIS — N179 Acute kidney failure, unspecified: Secondary | ICD-10-CM | POA: Diagnosis not present

## 2022-07-02 DIAGNOSIS — Z87891 Personal history of nicotine dependence: Secondary | ICD-10-CM

## 2022-07-02 DIAGNOSIS — I10 Essential (primary) hypertension: Secondary | ICD-10-CM | POA: Diagnosis not present

## 2022-07-02 DIAGNOSIS — E782 Mixed hyperlipidemia: Secondary | ICD-10-CM | POA: Diagnosis not present

## 2022-07-02 DIAGNOSIS — E1122 Type 2 diabetes mellitus with diabetic chronic kidney disease: Secondary | ICD-10-CM | POA: Diagnosis not present

## 2022-07-02 DIAGNOSIS — I2609 Other pulmonary embolism with acute cor pulmonale: Secondary | ICD-10-CM | POA: Diagnosis not present

## 2022-07-02 DIAGNOSIS — T380X5A Adverse effect of glucocorticoids and synthetic analogues, initial encounter: Secondary | ICD-10-CM | POA: Diagnosis present

## 2022-07-02 DIAGNOSIS — Z9081 Acquired absence of spleen: Secondary | ICD-10-CM | POA: Diagnosis not present

## 2022-07-02 DIAGNOSIS — R0789 Other chest pain: Secondary | ICD-10-CM | POA: Diagnosis not present

## 2022-07-02 DIAGNOSIS — R7989 Other specified abnormal findings of blood chemistry: Secondary | ICD-10-CM | POA: Diagnosis not present

## 2022-07-02 DIAGNOSIS — Z66 Do not resuscitate: Secondary | ICD-10-CM | POA: Diagnosis not present

## 2022-07-02 DIAGNOSIS — E1165 Type 2 diabetes mellitus with hyperglycemia: Secondary | ICD-10-CM | POA: Diagnosis present

## 2022-07-02 DIAGNOSIS — Z79899 Other long term (current) drug therapy: Secondary | ICD-10-CM

## 2022-07-02 DIAGNOSIS — N1831 Chronic kidney disease, stage 3a: Secondary | ICD-10-CM | POA: Diagnosis not present

## 2022-07-02 DIAGNOSIS — F411 Generalized anxiety disorder: Secondary | ICD-10-CM | POA: Diagnosis not present

## 2022-07-02 DIAGNOSIS — I700 Atherosclerosis of aorta: Secondary | ICD-10-CM | POA: Diagnosis present

## 2022-07-02 DIAGNOSIS — D631 Anemia in chronic kidney disease: Secondary | ICD-10-CM | POA: Diagnosis present

## 2022-07-02 DIAGNOSIS — J9811 Atelectasis: Secondary | ICD-10-CM | POA: Diagnosis not present

## 2022-07-02 DIAGNOSIS — Z7982 Long term (current) use of aspirin: Secondary | ICD-10-CM

## 2022-07-02 DIAGNOSIS — R079 Chest pain, unspecified: Secondary | ICD-10-CM | POA: Diagnosis not present

## 2022-07-02 DIAGNOSIS — I2510 Atherosclerotic heart disease of native coronary artery without angina pectoris: Secondary | ICD-10-CM | POA: Diagnosis present

## 2022-07-02 DIAGNOSIS — E876 Hypokalemia: Secondary | ICD-10-CM

## 2022-07-02 DIAGNOSIS — Z86711 Personal history of pulmonary embolism: Secondary | ICD-10-CM | POA: Diagnosis not present

## 2022-07-02 DIAGNOSIS — I739 Peripheral vascular disease, unspecified: Secondary | ICD-10-CM | POA: Diagnosis present

## 2022-07-02 DIAGNOSIS — C9000 Multiple myeloma not having achieved remission: Secondary | ICD-10-CM | POA: Diagnosis not present

## 2022-07-02 DIAGNOSIS — I5022 Chronic systolic (congestive) heart failure: Secondary | ICD-10-CM | POA: Diagnosis not present

## 2022-07-02 DIAGNOSIS — I2699 Other pulmonary embolism without acute cor pulmonale: Secondary | ICD-10-CM | POA: Diagnosis not present

## 2022-07-02 DIAGNOSIS — I214 Non-ST elevation (NSTEMI) myocardial infarction: Secondary | ICD-10-CM | POA: Diagnosis not present

## 2022-07-02 DIAGNOSIS — M549 Dorsalgia, unspecified: Secondary | ICD-10-CM | POA: Diagnosis present

## 2022-07-02 DIAGNOSIS — D471 Chronic myeloproliferative disease: Secondary | ICD-10-CM | POA: Diagnosis present

## 2022-07-02 DIAGNOSIS — G8929 Other chronic pain: Secondary | ICD-10-CM | POA: Diagnosis present

## 2022-07-02 DIAGNOSIS — D472 Monoclonal gammopathy: Secondary | ICD-10-CM | POA: Diagnosis present

## 2022-07-02 DIAGNOSIS — K219 Gastro-esophageal reflux disease without esophagitis: Secondary | ICD-10-CM | POA: Diagnosis present

## 2022-07-02 DIAGNOSIS — E785 Hyperlipidemia, unspecified: Secondary | ICD-10-CM | POA: Diagnosis present

## 2022-07-02 DIAGNOSIS — C8590 Non-Hodgkin lymphoma, unspecified, unspecified site: Secondary | ICD-10-CM | POA: Diagnosis present

## 2022-07-02 DIAGNOSIS — I130 Hypertensive heart and chronic kidney disease with heart failure and stage 1 through stage 4 chronic kidney disease, or unspecified chronic kidney disease: Secondary | ICD-10-CM | POA: Diagnosis present

## 2022-07-02 DIAGNOSIS — Z833 Family history of diabetes mellitus: Secondary | ICD-10-CM

## 2022-07-02 DIAGNOSIS — I252 Old myocardial infarction: Secondary | ICD-10-CM | POA: Diagnosis not present

## 2022-07-02 DIAGNOSIS — Z880 Allergy status to penicillin: Secondary | ICD-10-CM

## 2022-07-02 DIAGNOSIS — I5020 Unspecified systolic (congestive) heart failure: Secondary | ICD-10-CM | POA: Diagnosis present

## 2022-07-02 HISTORY — DX: Atherosclerosis of aorta: I70.0

## 2022-07-02 HISTORY — DX: Chronic systolic (congestive) heart failure: I50.22

## 2022-07-02 LAB — BASIC METABOLIC PANEL
Anion gap: 13 (ref 5–15)
BUN: 35 mg/dL — ABNORMAL HIGH (ref 8–23)
CO2: 23 mmol/L (ref 22–32)
Calcium: 9.2 mg/dL (ref 8.9–10.3)
Chloride: 106 mmol/L (ref 98–111)
Creatinine, Ser: 1.63 mg/dL — ABNORMAL HIGH (ref 0.44–1.00)
GFR, Estimated: 30 mL/min — ABNORMAL LOW (ref >60)
Glucose, Bld: 131 mg/dL — ABNORMAL HIGH (ref 70–99)
Potassium: 3.5 mmol/L (ref 3.5–5.1)
Sodium: 142 mmol/L (ref 135–145)

## 2022-07-02 LAB — CBC
HCT: 39.1 % (ref 36.0–46.0)
Hemoglobin: 12.6 g/dL (ref 12.0–15.0)
MCH: 27.4 pg (ref 26.0–34.0)
MCHC: 32.2 g/dL (ref 30.0–36.0)
MCV: 85 fL (ref 80.0–100.0)
Platelets: 330 10*3/uL (ref 150–400)
RBC: 4.6 MIL/uL (ref 3.87–5.11)
RDW: 20 % — ABNORMAL HIGH (ref 11.5–15.5)
WBC: 5.3 10*3/uL (ref 4.0–10.5)
nRBC: 0 % (ref 0.0–0.2)

## 2022-07-02 LAB — TSH: TSH: 0.331 u[IU]/mL — ABNORMAL LOW (ref 0.350–4.500)

## 2022-07-02 LAB — HEPARIN LEVEL (UNFRACTIONATED): Heparin Unfractionated: 0.22 IU/mL — ABNORMAL LOW (ref 0.30–0.70)

## 2022-07-02 LAB — HEMOGLOBIN A1C
Hgb A1c MFr Bld: 6.1 % — ABNORMAL HIGH (ref 4.8–5.6)
Mean Plasma Glucose: 128.37 mg/dL

## 2022-07-02 LAB — TROPONIN I (HIGH SENSITIVITY)
Troponin I (High Sensitivity): 1072 ng/L (ref <18)
Troponin I (High Sensitivity): 1030 ng/L (ref <18)
Troponin I (High Sensitivity): 1040 ng/L (ref <18)
Troponin I (High Sensitivity): 1086 ng/L (ref <18)

## 2022-07-02 MED ORDER — HYDROCODONE-ACETAMINOPHEN 5-325 MG PO TABS: 1.0000 | ORAL_TABLET | Freq: Four times a day (QID) | ORAL | Status: DC | PRN

## 2022-07-02 MED ORDER — PANTOPRAZOLE SODIUM 40 MG PO TBEC
40.0000 mg | DELAYED_RELEASE_TABLET | Freq: Every evening | ORAL | Status: DC
  Administered 2022-07-02 – 2022-07-04 (×3): 40 mg via ORAL
  Filled 2022-07-02 (×3): qty 1

## 2022-07-02 MED ORDER — ONDANSETRON HCL 4 MG/2ML IJ SOLN: 4.0000 mg | Freq: Four times a day (QID) | INTRAMUSCULAR | Status: DC | PRN

## 2022-07-02 MED ORDER — ASPIRIN 325 MG PO TABS
325.0000 mg | ORAL_TABLET | Freq: Once | ORAL | Status: AC
  Administered 2022-07-02: 325 mg via ORAL
  Filled 2022-07-02: qty 1

## 2022-07-02 MED ORDER — ONDANSETRON HCL 4 MG PO TABS: 4.0000 mg | ORAL_TABLET | Freq: Four times a day (QID) | ORAL | Status: DC | PRN

## 2022-07-02 MED ORDER — HYDROMORPHONE HCL 1 MG/ML IJ SOLN: 0.5000 mg | INTRAMUSCULAR | Status: DC | PRN

## 2022-07-02 MED ORDER — HEPARIN BOLUS VIA INFUSION
3400.0000 [IU] | Freq: Once | INTRAVENOUS | Status: AC
  Administered 2022-07-02: 3400 [IU] via INTRAVENOUS

## 2022-07-02 MED ORDER — ASPIRIN 81 MG PO CHEW
81.0000 mg | CHEWABLE_TABLET | Freq: Every day | ORAL | Status: DC
  Administered 2022-07-03 – 2022-07-05 (×3): 81 mg via ORAL
  Filled 2022-07-02 (×3): qty 1

## 2022-07-02 MED ORDER — SODIUM CHLORIDE 0.9 % IV SOLN: 1.0000 g | Freq: Once | INTRAVENOUS | Status: DC

## 2022-07-02 MED ORDER — ACETAMINOPHEN 325 MG PO TABS: 650.0000 mg | ORAL_TABLET | Freq: Four times a day (QID) | ORAL | Status: DC | PRN

## 2022-07-02 MED ORDER — NITROGLYCERIN 0.4 MG SL SUBL: 0.4000 mg | SUBLINGUAL_TABLET | SUBLINGUAL | Status: DC | PRN

## 2022-07-02 MED ORDER — ACYCLOVIR 400 MG PO TABS
400.0000 mg | ORAL_TABLET | Freq: Two times a day (BID) | ORAL | Status: DC
  Administered 2022-07-02 – 2022-07-05 (×6): 400 mg via ORAL
  Filled 2022-07-02 (×7): qty 1

## 2022-07-02 MED ORDER — HEPARIN BOLUS VIA INFUSION
900.0000 [IU] | Freq: Once | INTRAVENOUS | Status: AC
  Administered 2022-07-02: 900 [IU] via INTRAVENOUS
  Filled 2022-07-02: qty 900

## 2022-07-02 MED ORDER — INSULIN ASPART 100 UNIT/ML IJ SOLN: 2.0000 [IU] | Freq: Three times a day (TID) | INTRAMUSCULAR | Status: DC

## 2022-07-02 MED ORDER — INSULIN ASPART 100 UNIT/ML IJ SOLN: 0.0000 [IU] | Freq: Three times a day (TID) | INTRAMUSCULAR | Status: DC

## 2022-07-02 MED ORDER — HEPARIN (PORCINE) 25000 UT/250ML-% IV SOLN
1100.0000 [IU]/h | INTRAVENOUS | Status: AC
  Administered 2022-07-02: 650 [IU]/h via INTRAVENOUS
  Administered 2022-07-03: 850 [IU]/h via INTRAVENOUS
  Filled 2022-07-02 (×2): qty 250

## 2022-07-02 MED ORDER — METOPROLOL SUCCINATE ER 25 MG PO TB24: 12.5000 mg | ORAL_TABLET | Freq: Every day | ORAL | Status: DC

## 2022-07-02 MED ORDER — BISACODYL 5 MG PO TBEC: 5.0000 mg | DELAYED_RELEASE_TABLET | Freq: Every day | ORAL | Status: DC | PRN

## 2022-07-02 MED ORDER — METOPROLOL SUCCINATE ER 25 MG PO TB24
12.5000 mg | ORAL_TABLET | Freq: Every day | ORAL | Status: DC
  Administered 2022-07-03 – 2022-07-05 (×3): 12.5 mg via ORAL
  Filled 2022-07-02 (×3): qty 1

## 2022-07-02 MED ORDER — TRAZODONE HCL 50 MG PO TABS
25.0000 mg | ORAL_TABLET | Freq: Every evening | ORAL | Status: DC | PRN
  Administered 2022-07-02: 25 mg via ORAL
  Filled 2022-07-02: qty 1

## 2022-07-02 MED ORDER — ACETAMINOPHEN 650 MG RE SUPP: 650.0000 mg | Freq: Four times a day (QID) | RECTAL | Status: DC | PRN

## 2022-07-02 NOTE — Progress Notes (Signed)
ANTICOAGULATION CONSULT NOTE - follow-up  Pharmacy Consult for heparin Indication: chest pain/ACS  Allergies  Allergen Reactions   Penicillins Rash    Patient Measurements: Height: 5' (152.4 cm) Weight: 57.2 kg (126 lb) IBW/kg (Calculated) : 45.5 Heparin Dosing Weight: 57 kg  Vital Signs: Temp: 98.5 F (36.9 C) (05/10 2033) Temp Source: Oral (05/10 2033) BP: 106/49 (05/10 2033) Pulse Rate: 85 (05/10 2033)  Labs: Recent Labs    07/02/22 1248 07/02/22 1442 07/02/22 1712 07/02/22 2012 07/02/22 2300  HGB 12.6  --   --   --   --   HCT 39.1  --   --   --   --   PLT 330  --   --   --   --   HEPARINUNFRC  --   --   --   --  0.22*  CREATININE 1.63*  --   --   --   --   TROPONINIHS 1,072* 1,030* 1,040* 1,086*  --      Estimated Creatinine Clearance: 18.5 mL/min (A) (by C-G formula based on SCr of 1.63 mg/dL (H)).   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   07/01/2022   acyclovir (ZOVIRAX) 400 MG tablet Take 1 tablet (400 mg total) by mouth 2 (two) times daily. 60 tablet 6 07/01/2022   aspirin 81 MG tablet Take 81 mg by mouth daily.    07/01/2022   dapagliflozin propanediol (FARXIGA) 10 MG TABS tablet Take 1 tablet (10 mg total) by mouth daily. 30 tablet 1 07/01/2022   dexamethasone (DECADRON) 4 MG tablet Take 20 mg (5 tablets) by mouth weekly every Wednesday morning 20 tablet 6 06/23/2022   furosemide (LASIX) 40 MG tablet Take 1 tablet (40 mg total) by mouth daily. 30 tablet 1 07/01/2022   KLOR-CON M20 20 MEQ tablet NEW PRESCRIPTION REQUEST: TAKE TWO TABLETS BY MOUTH DAILY (Patient taking differently: Take 20 mEq by mouth daily. Take 2 tablets by mouth daily) 180 tablet 3 06/23/2022   lenalidomide (REVLIMID) 15 MG capsule Take 1 capsule (15 mg total) by mouth daily. Take for 21 days on, 7 days off. 21 capsule 0 06/29/2022   losartan (COZAAR) 25 MG tablet Take 0.5 tablets (12.5 mg total) by mouth daily. 30 tablet 0 07/01/2022   metoprolol succinate (TOPROL-XL) 25 MG 24 hr tablet Take 0.5 tablets (12.5 mg total) by mouth daily. 30 tablet 0 07/01/2022 at 1330   spironolactone (ALDACTONE) 25 MG tablet Take 0.5 tablets (12.5 mg total) by mouth daily. 30 tablet 0 07/01/2022   ketorolac (TORADOL)  10 MG tablet Take 1 tablet (10 mg total) by mouth every 6 (six) hours as needed. 30 tablet 0     Assessment: Pharmacy consulted to dose heparin in patient with chest pain/ACS. Patient is not on anticoagulation prior to admission.  CBC WNL Trop 1072  5/10 PM update: HL: 0.22 No signs of bleeding or issues with heparin infusion   Goal of Therapy:  Heparin level 0.3-0.7 units/ml Monitor platelets by anticoagulation protocol: Yes   Plan:  Give 900 units bolus x 1 Increase heparin infusion to 750 units/hr Check anti-Xa level in 8 hours and daily while on heparin Continue to monitor H&H and platelets  Quetzally Callas BS, PharmD, BCPS Clinical Pharmacist 07/02/2022 11:44 PM  Contact: [phone] after 3 PM  "Be curious, not judgmental..." -Debbora Dus

## 2022-07-02 NOTE — ED Triage Notes (Signed)
Pt c/o left side chest pain with sob and states she is now weak

## 2022-07-02 NOTE — ED Notes (Signed)
See triage notes. Pt states left sided cp with no radiation x 3 days. States gets sob with activity only. Denies cp or sob at this time. Pt is a/o. Color wnl. Non diaphoretic.

## 2022-07-02 NOTE — H&P (Addendum)
History and Physical  [facility]  Leslie Williamson ZOX:[id number] DOB: [birth year] DOA: 07/02/2022  PCP: Leslie Spar, MD  Patient coming from: Home  Level of care: Telemetry Cardiac  I have personally briefly reviewed patient's old medical records in [facility]  Chief Complaint: Chest Pain   HPI: Leslie Williamson is a 89 year old female with history of multiple myeloma currently on chemotherapy followed by Dr. 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  She was diuresed in the hospital and discharged home after diuresing about 5 L.  She presented to the emergency department today complaining of left-sided chest pain with shortness of breath and weakness.  She reported no radiation of pain.  It has been present for about 3 days intermittently but in last 12 hours it had been severe.  It was a deep chest pain that she has never experienced before. Her son convinced her to seek medical treatment after the pain persisted for hours.  She self medicated by taking 2 aspirin tablets that did seem to help the pain initially but then it returned.  She is very short of breath with minimal activity.  She denies diaphoresis.  She reports that currently she is chest pain-free while at rest in the ED.  Her ED workup was positive for high-sensitivity troponin of 1072.  Her EKG did not show any acute ST-T wave abnormalities.  Her labs revealed a mild AKI.  There was a focal opacity on chest x-ray that could represent atelectasis, pneumonia or aspiration.  Her blood glucose was mildly elevated at 131.  Cardiologist was consulted and  recommended starting IV heparin infusion and admission.    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   Vertigo, labyrinthine     Past Surgical History:  Procedure Laterality Date   CATARACT EXTRACTION W/PHACO Left 11/03/2015   Procedure: CATARACT EXTRACTION PHACO AND INTRAOCULAR LENS PLACEMENT (IOC);  Surgeon: Gemma Payor, MD;  Location: [facility];  Service: Ophthalmology;  Laterality: Left;  CDE: 7.74   CATARACT EXTRACTION W/PHACO Right 11/20/2015   Procedure: CATARACT EXTRACTION PHACO AND INTRAOCULAR LENS PLACEMENT ; CDE:  8.30;  Surgeon: Gemma Payor, MD;  Location: [facility];  Service: Ophthalmology;  Laterality: Right;   IR IMAGING GUIDED PORT INSERTION  11/04/2021   PORT-A-CATH REMOVAL       reports that she has quit smoking. She has never used smokeless tobacco. She reports that she does not drink alcohol and does not use drugs.  Allergies  Allergen Reactions   Penicillins Rash  Family History  Problem Relation Age of Onset   Diabetes Mother     Prior to Admission medications   Medication Sig Start Date End Date Taking? Authorizing Provider  azithromycin (ZITHROMAX) 250 MG tablet Take by mouth. 06/18/22  Yes [provider]  KLOR-CON M20 20 MEQ tablet NEW PRESCRIPTION REQUEST: TAKE TWO TABLETS BY MOUTH DAILY Patient taking differently: Take 20 mEq by mouth daily. Take 2 tablets by  mouth daily 06/30/22  Yes Doreatha Massed, MD  lenalidomide (REVLIMID) 15 MG capsule Take 1 capsule (15 mg total) by mouth daily. Take for 21 days on, 7 days off. 06/14/22  Yes Doreatha Massed, MD  losartan (COZAAR) 25 MG tablet Take 0.5 tablets (12.5 mg total) by mouth daily. 06/05/22  Yes Burnadette Pop, MD  metoprolol succinate (TOPROL-XL) 25 MG 24 hr tablet Take 0.5 tablets (12.5 mg total) by mouth daily. 06/05/22  Yes Burnadette Pop, MD  spironolactone (ALDACTONE) 25 MG tablet Take 0.5 tablets (12.5 mg total) by mouth daily. 06/05/22  Yes Burnadette Pop, MD  acyclovir (ZOVIRAX) 400 MG tablet Take 1 tablet (400 mg total) by mouth 2 (two) times daily. 12/30/21   Doreatha Massed, MD  aspirin 81 MG tablet Take 81 mg by mouth daily.    [provider]  atorvastatin (LIPITOR) 40 MG tablet Take 40 mg by mouth daily. 01/07/15   [provider]  benzonatate (TESSALON) 100 MG capsule Take 100 mg by mouth 3 (three) times daily. 06/11/22   [provider]  dapagliflozin propanediol (FARXIGA) 10 MG TABS tablet Take 1 tablet (10 mg total) by mouth daily. 06/05/22   Burnadette Pop, MD  dexamethasone (DECADRON) 4 MG tablet Take 20 mg (5 tablets) by mouth weekly every Wednesday morning 03/24/22   Doreatha Massed, MD  furosemide (LASIX) 40 MG tablet Take 1 tablet (40 mg total) by mouth daily. 06/05/22   Burnadette Pop, MD  ketorolac (TORADOL) 10 MG tablet Take 1 tablet (10 mg total) by mouth every 6 (six) hours as needed. 03/24/22   Doreatha Massed, MD    Physical Exam: Vitals:   07/02/22 1330 07/02/22 1400 07/02/22 1430 07/02/22 1500  BP: 107/72 92/63 103/71 122/77  Pulse: 87 81 87 89  Resp: (!) 23 (!) 23 (!) 25 (!) 25  Temp:      TempSrc:      SpO2: 95% 93% 95% 99%  Weight:      Height:        Constitutional: frail, elderly female, awake, alert, NAD, calm, comfortable Eyes: PERRL, lids and conjunctivae normal ENMT: Mucous membranes are moist. Posterior  pharynx clear of any exudate or lesions.Normal dentition.  Neck: normal, supple, no masses, no thyromegaly Respiratory: clear to auscultation bilaterally, no wheezing, no crackles. Normal respiratory effort. No accessory muscle use.  Cardiovascular: normal s1, s2 sounds, no murmurs / rubs / gallops. No extremity edema. 2+ pedal pulses. No carotid bruits.  Abdomen: no tenderness, no masses palpated. No hepatosplenomegaly. Bowel sounds positive.  Musculoskeletal: no clubbing / cyanosis. No joint deformity upper and lower extremities. Good ROM, no contractures. Normal muscle tone.  Skin: multiple facial nevi, no rashes, lesions, ulcers. No induration Neurologic: CN 2-12 grossly intact. Sensation intact, DTR normal. Strength 5/5 in all 4.  Psychiatric: Normal judgment and insight. Alert and oriented x 3. Normal mood.   Labs on Admission: I have personally reviewed following labs and imaging studies  CBC: Recent Labs  Lab 07/02/22 1248  WBC 5.3  HGB 12.6  HCT 39.1  MCV  85.0  PLT 330   Basic Metabolic Panel: Recent Labs  Lab 07/02/22 1248  NA 142  K 3.5  CL 106  CO2 23  GLUCOSE 131*  BUN 35*  CREATININE 1.63*  CALCIUM 9.2   GFR: Estimated Creatinine Clearance: 18.5 mL/min (A) (by C-G formula based on SCr of 1.63 mg/dL (H)). Liver Function Tests: No results for input(s): "AST", "ALT", "ALKPHOS", "BILITOT", "PROT", "ALBUMIN" in the last 168 hours. No results for input(s): "LIPASE", "AMYLASE" in the last 168 hours. No results for input(s): "AMMONIA" in the last 168 hours. Coagulation Profile: No results for input(s): "INR", "PROTIME" in the last 168 hours. Cardiac Enzymes: No results for input(s): "CKTOTAL", "CKMB", "CKMBINDEX", "TROPONINI" in the last 168 hours. BNP (last 3 results) No results for input(s): "PROBNP" in the last 8760 hours. HbA1C: No results for input(s): "HGBA1C" in the last 72 hours. CBG: No results for input(s): "GLUCAP" in the last 168 hours. Lipid  Profile: No results for input(s): "CHOL", "HDL", "LDLCALC", "TRIG", "CHOLHDL", "LDLDIRECT" in the last 72 hours. Thyroid Function Tests: No results for input(s): "TSH", "T4TOTAL", "FREET4", "T3FREE", "THYROIDAB" in the last 72 hours. Anemia Panel: No results for input(s): "VITAMINB12", "FOLATE", "FERRITIN", "TIBC", "IRON", "RETICCTPCT" in the last 72 hours. Urine analysis:    Component Value Date/Time   COLORURINE STRAW (A) 05/31/2022 1733   APPEARANCEUR CLEAR 05/31/2022 1733   LABSPEC 1.021 05/31/2022 1733   PHURINE 5.0 05/31/2022 1733   GLUCOSEU NEGATIVE 05/31/2022 1733   HGBUR NEGATIVE 05/31/2022 1733   HGBUR large 06/19/2007 0845   BILIRUBINUR NEGATIVE 05/31/2022 1733   BILIRUBINUR negative 08/05/2021 1334   KETONESUR NEGATIVE 05/31/2022 1733   PROTEINUR NEGATIVE 05/31/2022 1733   UROBILINOGEN 0.2 08/05/2021 1334   UROBILINOGEN 0.2 07/03/2012 1929   NITRITE NEGATIVE 05/31/2022 1733   LEUKOCYTESUR NEGATIVE 05/31/2022 1733    Radiological Exams on Admission: DG Chest 2 View  Result Date: 07/02/2022 CLINICAL DATA:  Chest pain and shortness of breath EXAM: CHEST - 2 VIEW COMPARISON:  Chest radiograph dated 05/31/2022 FINDINGS: Lines/tubes: Right chest wall port tip projects over the superior cavoatrial junction. Chest: Well inflated lungs. Focal opacity along the posterior lung base. Rounded radiodensity projecting over the right mid lung likely corresponds to a monitoring patch. Pleura: No pneumothorax or pleural effusion. Heart/mediastinum: The heart size and mediastinal contours are within normal limits. Bones: No acute osseous abnormality. Surgical clips project over the right neck and upper abdomen. IMPRESSION: Focal opacity along the posterior lung base may represent atelectasis, aspiration, or pneumonia. Electronically Signed   By: Agustin Cree M.D.   On: 07/02/2022 11:35    EKG: Independently reviewed. No acute ST-T wave abnormalities, controlled heart rate.    Assessment/Plan Principal Problem:   NSTEMI (non-ST elevated myocardial infarction) (HCC) Active Problems:   Chest pain   Hyperlipidemia   JAK2+ MDP (myeloproliferative disorder) presenting with thrombocytosis (HCC)   Anxiety state   Essential hypertension   PVD- know Lt SFA disease, bilat PT and AT disease   GERD   NHL (non-Hodgkin's lymphoma) (HCC)   Claudication of both lower extremities (HCC)   S/P splenectomy   Elevated troponin   Acute renal failure superimposed on stage 3a chronic kidney disease (HCC)   MGUS (monoclonal gammopathy of unknown significance)   Multiple myeloma (HCC)   HFrEF   DNR (do not resuscitate)    NSTEMI  - pt is now chest pain free and wanting to start eating - she is now on IV heparin infusion managed by pharm  D - will order IV pain management as needed for recurrent chest pain  - NTG prn as BP tolerates - check fasting lipid panel  - continue home statin therapy for now - supplemental oxygen as needed to maintain pulse ox>90% - loaded with aspirin in ED - resume home aspirin 81 mg daily 5/11  - discussed with cardiology  Hyperlipidemia  - follow up fasting lipid panel - resume home dose atorvastatin for now  Essential hypertension  - resume home metoprolol XL 12.5 mg daily   - losartan 12.5 mg temporarily on hold in setting of AKI   AKI  - suspect this is prerenal given several days of acute illness reported - holding diuretics temporarily  - liberalize diet today - recheck BMP in AM  - renally dose medications as needed   Chronic HFrEF - limited Echo ordered for follow up  - temporarily hold furosemide in setting of AKI  - temporarily hold spironolactone 12.5 mg in setting of AKI  - temporarily hold dapagliflozin 10 mg daily  in setting of AKI  - recheck labs in AM   Multiple Myeloma  - holding weekly dexamethasone for now (normally taken on Wed) - close follow up with Dr. Ellin Saba  - currently on a 7 day hold for  lenalidomide per Dr. Ellin Saba, was to resume on Mon 5/13   Abnormal CXR  - likely atelectasis given history - no s/s of infection at this time - ordered incentive spirometry  - repeat CXR in AM   Hyperglycemia - suspect this is secondary to recent Rx of dexamethasone that she takes weekly for MM therapy - follow up A1c test   DVT prophylaxis: IV heparin infusion   Code Status: Full   Family Communication: bedside update   Disposition Plan: admitting to [facility]   Consults called: cardiology   Admission status: INP  Level of care: Telemetry Cardiac Standley Dakins MD [facility] How to contact the [facility] Attending or Consulting provider 7A - 7P or covering provider during after hours 7P -7A, for this patient?  Check the care team in [facility] and look for a) attending/consulting [facility] provider listed and b) the [facility] team listed Log into [email or web address] and use [place]'s universal password to access. If you do not have the password, please contact the hospital operator. Locate the [facility] provider you are looking for under [facility] and page to a number that you can be directly reached. If you still have difficulty reaching the provider, please page the [facility] (Director on Call) for the Hospitalists listed on amion for assistance.   If 7PM-7AM, please contact night-coverage [email or web address] Password [facility]  07/02/2022, 3:42 PM

## 2022-07-02 NOTE — Hospital Course (Addendum)
89 year old female with history of multiple myeloma currently on chemotherapy followed by Dr. 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  She was diuresed in the hospital and discharged home after diuresing about 5 L.  She presented to the emergency department today complaining of left-sided chest pain with shortness of breath and weakness.  She reported no radiation of pain.  It has been present for about 3 days intermittently but in last 12 hours it had been severe.  It was a deep chest pain that she has never experienced before. Her son convinced her to seek medical treatment after the pain persisted for hours.  She self medicated by taking 2 aspirin tablets that did seem to help the pain initially but then it returned.  She is very short of breath with minimal activity.  She denies diaphoresis.  She reports that currently she is chest pain-free while at rest in the ED.  Her ED workup was positive for high-sensitivity troponin of 1072.  Her EKG did not show any acute ST-T wave abnormalities.  Her labs revealed a mild AKI.  There was a focal opacity on chest x-ray that could represent atelectasis, pneumonia or aspiration.  Her blood glucose was mildly elevated at 131.  Cardiologist was consulted and recommended starting IV heparin infusion and admission.

## 2022-07-02 NOTE — ED Notes (Signed)
Sats around 91-94   placed on 2Lnc

## 2022-07-02 NOTE — ED Provider Notes (Signed)
[place] EMERGENCY DEPARTMENT AT [facility] Provider Note   CSN: [id number] Arrival date & time: 07/02/22  1028     History  Chief Complaint  Patient presents with   Chest Pain    Leslie Williamson is a 89 y.o. female.  PMH of CHF, multiple myeloma, high cholesterol, diabetes.  Presents the ER today complaining of left sided chest pain that has been intermittent since yesterday, it is worse when she is lying down, last about 10 minutes at a time, describes as a funny feeling with nausea.  Denies any sweats or syncope or dizziness.  No cough, no fevers.  She reports that the pain does not seem to be worse with exertion. Denies fevers or chills. Patient did have recent hospitalization for CHF exacerbation and had overall been doing well aside from this.  Denies history of cardiac catheterization or stress test in the recent past.   Chest Pain      Home Medications Prior to Admission medications   Medication Sig Start Date End Date Taking? Authorizing Provider  acetaminophen (TYLENOL) 500 MG tablet Take 1,000 mg by mouth every 6 (six) hours as needed for moderate pain.   Yes [provider]  acyclovir (ZOVIRAX) 400 MG tablet Take 1 tablet (400 mg total) by mouth 2 (two) times daily. 12/30/21  Yes Doreatha Massed, MD  aspirin 81 MG tablet Take 81 mg by mouth daily.   Yes [provider]  dapagliflozin propanediol (FARXIGA) 10 MG TABS tablet Take 1 tablet (10 mg total) by mouth daily. 06/05/22  Yes Burnadette Pop, MD  dexamethasone (DECADRON) 4 MG tablet Take 20 mg (5 tablets) by mouth weekly every Wednesday morning 03/24/22  Yes Doreatha Massed, MD  furosemide (LASIX) 40 MG tablet Take 1 tablet (40 mg total) by mouth daily. 06/05/22  Yes Adhikari, Willia Craze, MD  KLOR-CON M20 20 MEQ tablet NEW PRESCRIPTION REQUEST: TAKE TWO TABLETS BY MOUTH DAILY Patient taking differently: Take 20 mEq by mouth daily. Take 2 tablets by mouth daily 06/30/22  Yes Doreatha Massed, MD  lenalidomide (REVLIMID) 15 MG capsule Take 1 capsule (15 mg total) by mouth daily. Take for 21 days on, 7 days off. 06/14/22  Yes Doreatha Massed, MD  losartan (COZAAR) 25 MG tablet Take 0.5 tablets (12.5 mg total) by mouth daily. 06/05/22  Yes Burnadette Pop, MD  metoprolol succinate (TOPROL-XL) 25 MG 24 hr tablet Take 0.5 tablets (12.5 mg total) by mouth daily. 06/05/22  Yes Burnadette Pop, MD  spironolactone (ALDACTONE) 25 MG tablet Take 0.5 tablets (12.5 mg total) by mouth daily. 06/05/22  Yes Burnadette Pop, MD  azithromycin (ZITHROMAX) 250 MG tablet Take by mouth. Patient not taking: Reported on 07/02/2022 06/18/22   [provider]  benzonatate (TESSALON) 100 MG capsule Take 100 mg by mouth 3 (three) times daily. Patient not taking: Reported on 07/02/2022 06/11/22   [provider]  ketorolac (TORADOL) 10 MG tablet Take 1 tablet (10 mg total) by mouth every 6 (six) hours as needed. 03/24/22   Doreatha Massed, MD      Allergies    Penicillins    Review of Systems   Review of Systems  Cardiovascular:  Positive for chest pain.    Physical Exam Updated Vital Signs BP 122/77   Pulse 89   Temp 98.7 F (37.1 C) (Oral)   Resp (!) 25   Ht 5' (1.524 m)   Wt 57.2 kg   SpO2 99%   BMI 24.61 kg/m  Physical  Exam Vitals and nursing note reviewed.  Constitutional:      General: She is not in acute distress.    Appearance: She is well-developed.  HENT:     Head: Normocephalic and atraumatic.  Eyes:     Conjunctiva/sclera: Conjunctivae normal.  Cardiovascular:     Rate and Rhythm: Normal rate and regular rhythm.     Heart sounds: Normal heart sounds. No murmur heard. Pulmonary:     Effort: Pulmonary effort is normal. No tachypnea or respiratory distress.     Breath sounds: Normal breath sounds.  Abdominal:     Palpations: Abdomen is soft.     Tenderness: There is no abdominal tenderness.  Musculoskeletal:        General: No swelling. Normal  range of motion.     Cervical back: Neck supple.  Skin:    General: Skin is warm and dry.     Capillary Refill: Capillary refill takes less than 2 seconds.  Neurological:     General: No focal deficit present.     Mental Status: She is alert and oriented to person, place, and time.  Psychiatric:        Mood and Affect: Mood normal.     ED Results / Procedures / Treatments   Labs (all labs ordered are listed, but only abnormal results are displayed) Labs Reviewed  BASIC METABOLIC PANEL - Abnormal; Notable for the following components:      Result Value   Glucose, Bld 131 (*)    BUN 35 (*)    Creatinine, Ser 1.63 (*)    GFR, Estimated 30 (*)    All other components within normal limits  CBC - Abnormal; Notable for the following components:   RDW 20.0 (*)    All other components within normal limits  TROPONIN I (HIGH SENSITIVITY) - Abnormal; Notable for the following components:   Troponin I (High Sensitivity) 1,072 (*)    All other components within normal limits  TROPONIN I (HIGH SENSITIVITY) - Abnormal; Notable for the following components:   Troponin I (High Sensitivity) 1,030 (*)    All other components within normal limits  HEPARIN LEVEL (UNFRACTIONATED)    EKG EKG Interpretation  Date/Time:  Friday Jul 02 2022 11:04:37 EDT Ventricular Rate:  94 PR Interval:  160 QRS Duration: 94 QT Interval:  384 QTC Calculation: 480 R Axis:   184 Text Interpretation: Normal sinus rhythm Right superior axis deviation Right ventricular hypertrophy Anterior infarct , age undetermined Abnormal ECG When compared with ECG of 31-May-2022 11:19, right axis deviation is new Confirmed by Alvino Blood ([id number]) on 07/02/2022 12:59:13 PM  Radiology DG Chest 2 View  Result Date: 07/02/2022 CLINICAL DATA:  Chest pain and shortness of breath EXAM: CHEST - 2 VIEW COMPARISON:  Chest radiograph dated 05/31/2022 FINDINGS: Lines/tubes: Right chest wall port tip projects over the superior  cavoatrial junction. Chest: Well inflated lungs. Focal opacity along the posterior lung base. Rounded radiodensity projecting over the right mid lung likely corresponds to a monitoring patch. Pleura: No pneumothorax or pleural effusion. Heart/mediastinum: The heart size and mediastinal contours are within normal limits. Bones: No acute osseous abnormality. Surgical clips project over the right neck and upper abdomen. IMPRESSION: Focal opacity along the posterior lung base may represent atelectasis, aspiration, or pneumonia. Electronically Signed   By: Agustin Cree M.D.   On: 07/02/2022 11:35    Procedures .Critical Care  Performed by: Ma Rings, PA-C Authorized by: Ma Rings, PA-C   Critical care  provider statement:    Critical care time (minutes):  30   Critical care was necessary to treat or prevent imminent or life-threatening deterioration of the following conditions:  Cardiac failure   Critical care was time spent personally by me on the following activities:  Development of treatment plan with patient or surrogate, discussions with consultants, evaluation of patient's response to treatment, examination of patient, ordering and review of laboratory studies, ordering and review of radiographic studies, ordering and performing treatments and interventions, pulse oximetry, re-evaluation of patient's condition, review of old charts and obtaining history from patient or surrogate   Care discussed with: admitting provider       Medications Ordered in ED Medications  heparin ADULT infusion 100 units/mL (25000 units/232mL) (650 Units/hr Intravenous New Bag/Given 07/02/22 1508)  aspirin tablet 325 mg (325 mg Oral Given 07/02/22 1501)  heparin bolus via infusion 3,400 Units (3,400 Units Intravenous Bolus from Bag 07/02/22 1509)    ED Course/ Medical Decision Making/ A&P                             Medical Decision Making This patient presents to the ED for concern of pain since last  night having multiple 10-minute episodes, resolved while in the ED, this involves an extensive number of treatment options, and is a complaint that carries with it a high risk of complications and morbidity.  The differential diagnosis includes ACS, pneumonia, pulmonary embolism, pneumothorax, costochondritis, other   Co morbidities that complicate the patient evaluation  Multiple myeloma, CHF   Additional history obtained:  Additional history obtained from EMR External records from outside source obtained and reviewed including recent discharge summary   Lab Tests:  I Ordered, and personally interpreted labs.  The pertinent results include: Troponin is 1027 initially, BUN and creatinine are slightly elevated from patient's baseline, no anemia, no leukocytosis   Imaging Studies ordered:  I ordered imaging studies including chest x-ray I independently visualized and interpreted imaging which showed through a few doses seem to show an opacity I agree with the radiologist interpretation   Cardiac Monitoring: / EKG:  The patient was maintained on a cardiac monitor.  I personally viewed and interpreted the cardiac monitored which showed an underlying rhythm of: Sinus rhythm   Consultations Obtained:  I requested consultation with the cardiologist, Dr. Diona Browner   Problem List / ED Course / Critical interventions / Medication management  Chest pain-elevated troponin concern for ACS.  Patient's chest pain-free in the ED, EKG did not show any ischemic changes.  Vitals are reassuring.  Given elevated troponin she was given aspirin and heparin discussed case with Dr. Diona Browner who feels given her chronic medical condition she should be admitted to by the hospitalist team but followed by cardiology.  At this time they do not plan to do any invasive testing but will obtain an echocardiogram over the weekend and want to cycle her troponins.  Patient was initially tearful when we discussed  admission but willing to be admitted at this time.  She expresses to me a desire to be DNR, states she would not want CPR or intubation if she were to stop breathing or have her heartbeat stop.  Her daughter is at bedside and agreeable with the admission plans on staying with her as much as she can.    Social Determinants of Health:  Lives at home, is elderly      Amount and/or Complexity of Data  Reviewed Labs: ordered. Radiology: ordered.  Risk OTC drugs. Decision regarding hospitalization.           Final Clinical Impression(s) / ED Diagnoses Final diagnoses:  NSTEMI (non-ST elevated myocardial infarction) [facility])    Rx / DC Orders ED Discharge Orders     None         Josem Kaufmann 07/02/22 1548    Lonell Grandchild, MD 07/02/22 [phone]

## 2022-07-02 NOTE — Consult Note (Signed)
Cardiology Consultation:   Patient ID: Leslie Williamson; [id number]; 18-Mar-1931   Admit date: 07/02/2022 Date of Consult: 07/02/2022  Primary Care Provider: Benetta Spar, MD Primary Cardiologist: Dina Rich, MD  History of Present Illness:   Leslie Williamson is an 89 y.o. female presenting to the [facility] the ER due to recent onset chest discomfort.  She arrived by personal vehicle.  States that after dinner last evening she began to feel a sense of unease in her chest, nausea, diaphoresis, and weakness.  Also feeling of tightness which waxed and waned for several hours.  She took aspirin and could not rest overnight, family members noticed that she remained uncomfortable this morning prompting further medical evaluation.  Symptoms had largely resolved by the time she was evaluated at the ER and she is chest pain-free now.  Initial high-sensitivity troponin I 1072.  ECG shows sinus rhythm with rightward axis, poor R wave progression rule out old anterior infarct pattern, and nonspecific ST changes.  She was recently hospitalized in April with newly documented HFmrEF, LVEF 45 to 50% range and plan for medical therapy per Dr. Wyline Mood.  She was actually scheduled for office follow-up this afternoon.  Also age advanced aortic atherosclerosis by CT imaging but no previously documented history of obstructive CAD.  Recent outpatient regimen includes Toprol-XL, Aldactone, Cozaar, Farxiga, aspirin, and Lasix.  She reports compliance with therapy.  Lives at home with close assistance by family members.  Functional with basic ADLs.  She does have a significant oncology history including active treatment of multiple myeloma and prior documented blood dyscrasias.  Please refer to the oncology note from Dr. Ellin Saba for full details.  ROS:  Pertinent review in history of present illness.  No palpitations or syncope.  She does report leg edema although improved compared to recent hospital stay.   No orthopnea or PND.  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   Vertigo, labyrinthine     Past Surgical History:  Procedure Laterality Date   CATARACT EXTRACTION W/PHACO Left 11/03/2015   Procedure: CATARACT EXTRACTION PHACO AND INTRAOCULAR LENS PLACEMENT (IOC);  Surgeon: Gemma Payor, MD;  Location: [facility];  Service: Ophthalmology;  Laterality: Left;  CDE: 7.74   CATARACT EXTRACTION W/PHACO Right 11/20/2015   Procedure: CATARACT EXTRACTION PHACO AND INTRAOCULAR LENS PLACEMENT ; CDE:  8.30;  Surgeon: Gemma Payor, MD;  Location: [facility];  Service: Ophthalmology;  Laterality: Right;   IR IMAGING GUIDED PORT INSERTION  11/04/2021   PORT-A-CATH REMOVAL       Outpatient Medications: No current facility-administered medications on file prior to encounter.   Current Outpatient Medications on File Prior to Encounter  Medication Sig Dispense Refill   acetaminophen (TYLENOL) 500 MG tablet Take 1,000 mg by mouth every 6 (six) hours as needed for moderate  pain.     acyclovir (ZOVIRAX) 400 MG tablet Take 1 tablet (400 mg total) by mouth 2 (two) times daily. 60 tablet 6   aspirin 81 MG tablet Take 81 mg by mouth daily.     dapagliflozin propanediol (FARXIGA) 10 MG TABS tablet Take 1 tablet (10 mg total) by mouth daily. 30 tablet 1   dexamethasone (DECADRON) 4 MG tablet Take 20 mg (5 tablets) by mouth weekly every  Wednesday morning 20 tablet 6   furosemide (LASIX) 40 MG tablet Take 1 tablet (40 mg total) by mouth daily. 30 tablet 1   KLOR-CON M20 20 MEQ tablet NEW PRESCRIPTION REQUEST: TAKE TWO TABLETS BY MOUTH DAILY (Patient taking differently: Take 20 mEq by mouth daily. Take 2 tablets by mouth daily) 180 tablet 3   lenalidomide (REVLIMID) 15 MG capsule Take 1 capsule (15 mg total) by mouth daily. Take for 21 days on, 7 days off. 21 capsule 0   losartan (COZAAR) 25 MG tablet Take 0.5 tablets (12.5 mg total) by mouth daily. 30 tablet 0   metoprolol succinate (TOPROL-XL) 25 MG 24 hr tablet Take 0.5 tablets (12.5 mg total) by mouth daily. 30 tablet 0   spironolactone (ALDACTONE) 25 MG tablet Take 0.5 tablets (12.5 mg total) by mouth daily. 30 tablet 0   azithromycin (ZITHROMAX) 250 MG tablet Take by mouth. (Patient not taking: Reported on 07/02/2022)     benzonatate (TESSALON) 100 MG capsule Take 100 mg by mouth 3 (three) times daily. (Patient not taking: Reported on 07/02/2022)     ketorolac (TORADOL) 10 MG tablet Take 1 tablet (10 mg total) by mouth every 6 (six) hours as needed. 30 tablet 0     Allergies:    Allergies  Allergen Reactions   Penicillins Rash    Social History:   Social History   Tobacco Use   Smoking status: Former   Smokeless tobacco: Never  Substance Use Topics   Alcohol use: No    Family History:   The patient's family history includes Diabetes in her mother.  Physical Exam/Data:   Vitals:   07/02/22 1103 07/02/22 1107 07/02/22 1330 07/02/22 1400  BP: (!) 97/58  107/72 92/63  Pulse: 93  87 81  Resp: 18  (!) 23 (!) 23  Temp: 98.7 F (37.1 C)     TempSrc: Oral     SpO2: 96%  95% 93%  Weight:  57.2 kg    Height:  5' (1.524 m)     No intake or output data in the 24 hours ending 07/02/22 1510 Filed Weights   07/02/22 1107  Weight: 57.2 kg   Body mass index is 24.61 kg/m.   Gen: Patient appears comfortable at rest. HEENT: Conjunctiva and lids normal. Neck:  Supple, no elevated JVP or carotid bruits. Lungs: Few rhonchi without wheezing, nonlabored breathing at rest. Cardiac: Regular rate and rhythm, no S3, 1/6 systolic murmur, no pericardial rub. Abdomen: Soft, nontender, bowel sounds present. Extremities: 1-2+ leg edema, distal pulses 2+. Skin: Warm and dry. Musculoskeletal: Kyphosis. Neuropsychiatric: Alert and oriented x3, affect grossly appropriate.  Telemetry:  I personally reviewed telemetry which shows sinus rhythm.  Relevant CV Studies:  Echocardiogram 06/01/2022:  1. Left ventricular ejection fraction, by estimation, is 45 to 50%. The  left ventricle has mildly decreased function. The left ventricle  demonstrates global hypokinesis. There is mild left ventricular  hypertrophy. Left ventricular diastolic parameters  are consistent with Grade I diastolic dysfunction (impaired relaxation).  Elevated left atrial pressure.  2. Right ventricular systolic function is normal. The right ventricular  size is normal. There is normal pulmonary artery systolic pressure.   3. Left atrial size was severely dilated.   4. The mitral valve is abnormal. Mild mitral valve regurgitation. No  evidence of mitral stenosis.   5. The tricuspid valve is abnormal.   6. The aortic valve is tricuspid. Aortic valve regurgitation is mild. No  aortic stenosis is present.   7. The inferior vena cava is normal in size with greater than 50%  respiratory variability, suggesting right atrial pressure of 3 mmHg.   Laboratory Data:  Chemistry Recent Labs  Lab 07/02/22 1248  NA 142  K 3.5  CL 106  CO2 23  GLUCOSE 131*  BUN 35*  CREATININE 1.63*  CALCIUM 9.2  GFRNONAA 30*  ANIONGAP 13    Hematology Recent Labs  Lab 07/02/22 1248  WBC 5.3  RBC 4.60  HGB 12.6  HCT 39.1  MCV 85.0  MCH 27.4  MCHC 32.2  RDW 20.0*  PLT 330   Cardiac Enzymes Recent Labs  Lab 07/02/22 1248  TROPONINIHS 1,072*   Lipid Panel     Component Value Date/Time    CHOL 137 11/06/2008 2018   TRIG 103 11/06/2008 2018   HDL 40 11/06/2008 2018   CHOLHDL 3.4 Ratio 11/06/2008 2018   VLDL 21 11/06/2008 2018   LDLCALC 76 11/06/2008 2018    Radiology/Studies:  DG Chest 2 View  Result Date: 07/02/2022 CLINICAL DATA:  Chest pain and shortness of breath EXAM: CHEST - 2 VIEW COMPARISON:  Chest radiograph dated 05/31/2022 FINDINGS: Lines/tubes: Right chest wall port tip projects over the superior cavoatrial junction. Chest: Well inflated lungs. Focal opacity along the posterior lung base. Rounded radiodensity projecting over the right mid lung likely corresponds to a monitoring patch. Pleura: No pneumothorax or pleural effusion. Heart/mediastinum: The heart size and mediastinal contours are within normal limits. Bones: No acute osseous abnormality. Surgical clips project over the right neck and upper abdomen. IMPRESSION: Focal opacity along the posterior lung base may represent atelectasis, aspiration, or pneumonia. Electronically Signed   By: Agustin Cree M.D.   On: 07/02/2022 11:35    Assessment and Plan:   1.  Recent onset chest discomfort and high-sensitivity troponin I level consistent with NSTEMI.  No prior documented history of obstructive CAD but ischemic heart disease certainly suspected at age 75 with advanced aortic atherosclerosis by CT imaging, recently documented HFmrEF and LVEF 45 to 50%, hypertension, and hyperlipidemia.  She is currently chest pain-free, ECG shows no evolving ST segment changes with baseline abnormalities.  2.  Multiple myeloma currently on Daratumumab, Revlimid and dexamethasone cycles with follow-up in the oncology clinic with Dr. Ellin Saba.  Please refer to specific oncology notes for full details of cancer history.  3.  Subacute renal insufficiency, creatinine up to 1.63 up from normal range one month ago.  She has had medication adjustments over the last month including addition of Cozaar, Farxiga, Aldactone, and Lasix.  4.   Essential hypertension.  5.  Mixed hyperlipidemia by history.,  Currently not on statin therapy.  No recent lipid panel for review.  Chart reviewed and situation discussed with the patient and family member at bedside.  Recommend hospitalization for further workup and formulation of treatment plan.  She has been on reasonable medical therapy in terms of recent diagnosis of HFmrEF.  Dr. Verna Czech last progress note indicated plan for relatively conservative management without further ischemic testing.  Now presenting with  NSTEMI.  Continue aspirin, IV heparin started in the ER.  Would continue to cycle cardiac enzymes and check a repeat limited echocardiogram to reevaluate LVEF.  Hold Lasix and Aldactone for now and follow renal function trend.  Not entirely clear that invasive cardiac testing will be pursued at this point particularly if she stabilizes on medical therapy.  She is being admitted to the hospitalist service at [facility] and our cardiology can team continue to follow and make further recommendations depending on her clinical progress.  For questions or updates, please contact [facility] Please consult [email or web address] for contact info under   Signed, Nona Dell, MD  07/02/2022 3:10 PM

## 2022-07-02 NOTE — ED Provider Triage Note (Signed)
Emergency Medicine Provider Triage Evaluation Note  Leslie Williamson , a 89 y.o. female  was evaluated in triage.  Pt complains of left-sided chest pain, described as pressure, shortness of breath and generalized weakness.  Her symptoms started yesterday afternoon.  States she was started on a new medication yesterday by her primary provider which she cannot name but her symptoms started after taking this medication.  Review of Systems  Positive: Chest pain, shortness of breath, weakness Negative: Fever, cough, nausea or vomiting, abdominal pain,  Physical Exam  BP (!) 97/58 (BP Location: Right Arm)   Pulse 93   Temp 98.7 F (37.1 C) (Oral)   Resp 18   Ht 5' (1.524 m)   Wt 57.2 kg   SpO2 96%   BMI 24.61 kg/m  Gen:   Awake, no distress   Resp:  Normal effort  MSK:   Moves extremities without difficulty  Other:    Medical Decision Making  Medically screening exam initiated at 12:30 PM.  Appropriate orders placed.  DEBORH MANDEL was informed that the remainder of the evaluation will be completed by another provider, this initial triage assessment does not replace that evaluation, and the importance of remaining in the ED until their evaluation is complete.     Burgess Amor, PA-C 07/02/22 1232

## 2022-07-02 NOTE — Progress Notes (Addendum)
ANTICOAGULATION CONSULT NOTE - Initial Consult  Pharmacy Consult for heparin Indication: chest pain/ACS  Allergies  Allergen Reactions   Penicillins Rash    Patient Measurements: Height: 5' (152.4 cm) Weight: 57.2 kg (126 lb) IBW/kg (Calculated) : 45.5 Heparin Dosing Weight: 57 kg  Vital Signs: Temp: 98.7 F (37.1 C) (05/10 1103) Temp Source: Oral (05/10 1103) BP: 92/63 (05/10 1400) Pulse Rate: 81 (05/10 1400)  Labs: Recent Labs    07/02/22 1248  HGB 12.6  HCT 39.1  PLT 330  CREATININE 1.63*  TROPONINIHS 1,072*    Estimated Creatinine Clearance: 18.5 mL/min (A) (by C-G formula based on SCr of 1.63 mg/dL (H)).   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 Patient is not on anticoagulation prior to admission.  CBC WNL Trop 1072  Goal of Therapy:  Heparin level 0.3-0.7 units/ml Monitor platelets by anticoagulation protocol: Yes   Plan:  Give 3400 units bolus x 1 Start heparin infusion at 650 units/hr Check anti-Xa level in 8 hours and daily while on heparin Continue to monitor H&H and platelets  Judeth Cornfield, PharmD Clinical Pharmacist 07/02/2022 2:53 PM

## 2022-07-02 NOTE — Progress Notes (Deleted)
   Cardiology Office Note    Date:  07/02/2022  ID:  Abelina, Warnell [birth year], MRN [id number] Cardiologist: Dina Rich, MD    History of Present Illness:    Leslie Williamson is a 89 y.o. female with past medical history multiple myeloma, prior lymphoma, HTN and HLD who presents to the office today for hospital follow-up.  She was recently admitted to [facility] in 05/2022 for an acute CHF exacerbation and repeat echocardiogram at that time showed an EF was reduced at 45 to 50%. She responded well to IV Lasix during admission with a recorded net output of -4.2 L. She was transitioned to Lasix 40 mg daily at the time of discharge. Given her advanced age and multiple myeloma, ischemic testing was not recommended at that time and medical management was pursued. Was recommended to obtain an echocardiogram in the next few months. She was started on Toprol-XL 12.5 mg daily, Farxiga 10 mg daily, Losartan 12.5 mg daily and Aldactone 12.5 mg daily. Was recommended to consider switching Losartan to Entresto pending BP response.  ROS: ***  Studies Reviewed:   EKG: EKG is*** ordered today and demonstrates ***  Echocardiogram: 06/01/2022 IMPRESSIONS     1. Left ventricular ejection fraction, by estimation, is 45 to 50%. The  left ventricle has mildly decreased function. The left ventricle  demonstrates global hypokinesis. There is mild left ventricular  hypertrophy. Left ventricular diastolic parameters  are consistent with Grade I diastolic dysfunction (impaired relaxation).  Elevated left atrial pressure.   2. Right ventricular systolic function is normal. The right ventricular  size is normal. There is normal pulmonary artery systolic pressure.   3. Left atrial size was severely dilated.   4. The mitral valve is abnormal. Mild mitral valve regurgitation. No  evidence of mitral stenosis.   5. The tricuspid valve is abnormal.   6. The aortic valve is tricuspid. Aortic valve  regurgitation is mild. No  aortic stenosis is present.   7. The inferior vena cava is normal in size with greater than 50%  respiratory variability, suggesting right atrial pressure of 3 mmHg.   Risk Assessment/Calculations:   {Does this patient have ATRIAL FIBRILLATION?[phone]} No BP recorded.  {Refresh Note OR Click here to enter BP  :1}***         Physical Exam:   VS:  There were no vitals taken for this visit.   Wt Readings from Last 3 Encounters:  06/16/22 128 lb 3.2 oz (58.2 kg)  06/04/22 124 lb 5.4 oz (56.4 kg)  05/05/22 144 lb (65.3 kg)     GEN: Well nourished, well developed in no acute distress NECK: No JVD; No carotid bruits CARDIAC: ***RRR, no murmurs, rubs, gallops RESPIRATORY:  Clear to auscultation without rales, wheezing or rhonchi  ABDOMEN: Appears non-distended. No obvious abdominal masses. EXTREMITIES: No clubbing or cyanosis. No edema.  Distal pedal pulses are 2+ bilaterally.   Assessment and Plan:   1. HFmrEF - ***  2. HTN - ***  3. HLD - ***  4. Multiple Myeloma - ***  Signed, Ellsworth Lennox, PA-C

## 2022-07-02 NOTE — ED Notes (Signed)
Date and time results received: 07/02/22 1355  (use smartphrase ".now" to insert current time)  Test: troponin  Critical Value: 1072  Name of Provider Notified: Dr Suezanne Jacquet  Orders Received? Or Actions Taken?: Orders Received - See Orders for details

## 2022-07-03 ENCOUNTER — Inpatient Hospital Stay (HOSPITAL_COMMUNITY): Payer: Medicare Other

## 2022-07-03 DIAGNOSIS — I214 Non-ST elevation (NSTEMI) myocardial infarction: Secondary | ICD-10-CM

## 2022-07-03 DIAGNOSIS — I10 Essential (primary) hypertension: Secondary | ICD-10-CM

## 2022-07-03 DIAGNOSIS — I2601 Septic pulmonary embolism with acute cor pulmonale: Secondary | ICD-10-CM

## 2022-07-03 DIAGNOSIS — I2699 Other pulmonary embolism without acute cor pulmonale: Secondary | ICD-10-CM | POA: Insufficient documentation

## 2022-07-03 LAB — BASIC METABOLIC PANEL
Anion gap: 14 (ref 5–15)
BUN: 30 mg/dL — ABNORMAL HIGH (ref 8–23)
CO2: 20 mmol/L — ABNORMAL LOW (ref 22–32)
Calcium: 8.8 mg/dL — ABNORMAL LOW (ref 8.9–10.3)
Chloride: 107 mmol/L (ref 98–111)
Creatinine, Ser: 1.54 mg/dL — ABNORMAL HIGH (ref 0.44–1.00)
GFR, Estimated: 32 mL/min — ABNORMAL LOW (ref >60)
Glucose, Bld: 144 mg/dL — ABNORMAL HIGH (ref 70–99)
Potassium: 3.4 mmol/L — ABNORMAL LOW (ref 3.5–5.1)
Sodium: 141 mmol/L (ref 135–145)

## 2022-07-03 LAB — HEPARIN LEVEL (UNFRACTIONATED)
Heparin Unfractionated: 0.19 IU/mL — ABNORMAL LOW (ref 0.30–0.70)
Heparin Unfractionated: 0.51 IU/mL (ref 0.30–0.70)

## 2022-07-03 LAB — LIPID PANEL
Cholesterol: 121 mg/dL (ref 0–200)
HDL: 37 mg/dL — ABNORMAL LOW (ref >40)
LDL Cholesterol: 70 mg/dL (ref 0–99)
Total CHOL/HDL Ratio: 3.3 RATIO
Triglycerides: 70 mg/dL (ref <150)
VLDL: 14 mg/dL (ref 0–40)

## 2022-07-03 LAB — TSH: TSH: 0.21 u[IU]/mL — ABNORMAL LOW (ref 0.350–4.500)

## 2022-07-03 LAB — CBC
HCT: 34.5 % — ABNORMAL LOW (ref 36.0–46.0)
Hemoglobin: 11.3 g/dL — ABNORMAL LOW (ref 12.0–15.0)
MCH: 27.3 pg (ref 26.0–34.0)
MCHC: 32.8 g/dL (ref 30.0–36.0)
MCV: 83.3 fL (ref 80.0–100.0)
Platelets: 342 10*3/uL (ref 150–400)
RBC: 4.14 MIL/uL (ref 3.87–5.11)
RDW: 19.9 % — ABNORMAL HIGH (ref 11.5–15.5)
WBC: 9.3 10*3/uL (ref 4.0–10.5)
nRBC: 0.2 % (ref 0.0–0.2)

## 2022-07-03 LAB — ECHOCARDIOGRAM LIMITED
Est EF: 50
Height: 60 in
S' Lateral: 2.1 cm
Weight: 2016 oz

## 2022-07-03 LAB — T4, FREE: Free T4: 1.22 ng/dL — ABNORMAL HIGH (ref 0.61–1.12)

## 2022-07-03 MED ORDER — HEPARIN BOLUS VIA INFUSION
1250.0000 [IU] | Freq: Once | INTRAVENOUS | Status: AC
  Administered 2022-07-03: 1250 [IU] via INTRAVENOUS
  Filled 2022-07-03: qty 1250

## 2022-07-03 MED ORDER — IOHEXOL 350 MG/ML SOLN
75.0000 mL | Freq: Once | INTRAVENOUS | Status: AC | PRN
  Administered 2022-07-03: 75 mL via INTRAVENOUS

## 2022-07-03 MED ORDER — ATORVASTATIN CALCIUM 10 MG PO TABS
20.0000 mg | ORAL_TABLET | Freq: Every day | ORAL | Status: DC
  Administered 2022-07-03 – 2022-07-05 (×3): 20 mg via ORAL
  Filled 2022-07-03 (×3): qty 2

## 2022-07-03 MED ORDER — CHLORHEXIDINE GLUCONATE CLOTH 2 % EX PADS
6.0000 | MEDICATED_PAD | Freq: Every day | CUTANEOUS | Status: DC
  Administered 2022-07-03 – 2022-07-05 (×3): 6 via TOPICAL

## 2022-07-03 MED ORDER — POTASSIUM CHLORIDE CRYS ER 20 MEQ PO TBCR
40.0000 meq | EXTENDED_RELEASE_TABLET | Freq: Once | ORAL | Status: AC
  Administered 2022-07-03: 40 meq via ORAL
  Filled 2022-07-03: qty 2

## 2022-07-03 NOTE — Progress Notes (Signed)
Rounding Note    Patient Name: Leslie Williamson Date of Encounter: 07/03/2022  [facility] Cardiologist: Dina Rich, MD   Subjective   Pt had some mild chest tightness and SOB when going to restroom   Mild dizziness  Inpatient Medications    Scheduled Meds:  acyclovir  400 mg Oral BID   aspirin  81 mg Oral Daily   atorvastatin  20 mg Oral Daily   Chlorhexidine Gluconate Cloth  6 each Topical Daily   metoprolol succinate  12.5 mg Oral Daily   pantoprazole  40 mg Oral QPM   Continuous Infusions:  heparin 850 Units/hr (07/03/22 1124)   PRN Meds: acetaminophen **OR** acetaminophen, bisacodyl, HYDROcodone-acetaminophen, HYDROmorphone (DILAUDID) injection, nitroGLYCERIN, ondansetron **OR** ondansetron (ZOFRAN) IV, traZODone   Vital Signs    Vitals:   07/02/22 2033 07/03/22 0015 07/03/22 0416 07/03/22 1120  BP: (!) 106/49 (!) 105/48 (!) 116/57 108/64  Pulse: 85 88 87 91  Resp: 20 18 18 17   Temp: 98.5 F (36.9 C) 98.1 F (36.7 C) 97.9 F (36.6 C) 98.1 F (36.7 C)  TempSrc: Oral Oral Oral Oral  SpO2: 96% 93% 93% 92%  Weight:      Height:        Intake/Output Summary (Last 24 hours) at 07/03/2022 1242 Last data filed at 07/03/2022 0411 Gross per 24 hour  Intake 131.75 ml  Output --  Net 131.75 ml      07/02/2022   11:07 AM 06/16/2022    1:39 PM 06/04/2022    5:00 AM  Last 3 Weights  Weight (lbs) 126 lb 128 lb 3.2 oz 124 lb 5.4 oz  Weight (kg) 57.153 kg 58.151 kg 56.4 kg      Telemetry    SR  - Personally Reviewed  ECG    No newe today  - Personally Reviewed  Physical Exam   GEN: Thin 89 yo in NAD  Neck: No JVD Cardiac: RRR  II/VI systolic murmur  Respiratory: Clear to auscultation bilaterally. GI: Soft, nontender, no hepatomegaly  MS: Tr edema; No deformity  Neg homans sign    Labs    High Sensitivity Troponin:   Recent Labs  Lab 07/02/22 1248 07/02/22 1442 07/02/22 1712 07/02/22 2012  TROPONINIHS 1,072* 1,030* 1,040*  1,086*     Chemistry Recent Labs  Lab 07/02/22 1248 07/03/22 1018  NA 142 141  K 3.5 3.4*  CL 106 107  CO2 23 20*  GLUCOSE 131* 144*  BUN 35* 30*  CREATININE 1.63* 1.54*  CALCIUM 9.2 8.8*  GFRNONAA 30* 32*  ANIONGAP 13 14    Lipids No results for input(s): "CHOL", "TRIG", "HDL", "LABVLDL", "LDLCALC", "CHOLHDL" in the last 168 hours.  Hematology Recent Labs  Lab 07/02/22 1248 07/03/22 1018  WBC 5.3 9.3  RBC 4.60 4.14  HGB 12.6 11.3*  HCT 39.1 34.5*  MCV 85.0 83.3  MCH 27.4 27.3  MCHC 32.2 32.8  RDW 20.0* 19.9*  PLT 330 342   Thyroid  Recent Labs  Lab 07/03/22 1018  TSH 0.210*  FREET4 1.22*    BNPNo results for input(s): "BNP", "PROBNP" in the last 168 hours.  DDimer No results for input(s): "DDIMER" in the last 168 hours.   Radiology    DG CHEST PORT 1 VIEW  Result Date: 07/03/2022 CLINICAL DATA:  Atelectasis. EXAM: PORTABLE CHEST 1 VIEW COMPARISON:  07/02/2022 FINDINGS: The lungs are clear without focal pneumonia, edema, pneumothorax or pleural effusion. Cardiopericardial silhouette is at upper limits of normal for  size. Right Port-A-Cath again noted. Telemetry leads overlie the chest. IMPRESSION: No active disease. Electronically Signed   By: Kennith Center M.D.   On: 07/03/2022 08:49   DG Chest 2 View  Result Date: 07/02/2022 CLINICAL DATA:  Chest pain and shortness of breath EXAM: CHEST - 2 VIEW COMPARISON:  Chest radiograph dated 05/31/2022 FINDINGS: Lines/tubes: Right chest wall port tip projects over the superior cavoatrial junction. Chest: Well inflated lungs. Focal opacity along the posterior lung base. Rounded radiodensity projecting over the right mid lung likely corresponds to a monitoring patch. Pleura: No pneumothorax or pleural effusion. Heart/mediastinum: The heart size and mediastinal contours are within normal limits. Bones: No acute osseous abnormality. Surgical clips project over the right neck and upper abdomen. IMPRESSION: Focal opacity along  the posterior lung base may represent atelectasis, aspiration, or pneumonia. Electronically Signed   By: Agustin Cree M.D.   On: 07/02/2022 11:35    Cardiac Studies   Echocardiogram 06/01/2022:  1. Left ventricular ejection fraction, by estimation, is 45 to 50%. The  left ventricle has mildly decreased function. The left ventricle  demonstrates global hypokinesis. There is mild left ventricular  hypertrophy. Left ventricular diastolic parameters  are consistent with Grade I diastolic dysfunction (impaired relaxation).  Elevated left atrial pressure.   2. Right ventricular systolic function is normal. The right ventricular  size is normal. There is normal pulmonary artery systolic pressure.   3. Left atrial size was severely dilated.   4. The mitral valve is abnormal. Mild mitral valve regurgitation. No  evidence of mitral stenosis.   5. The tricuspid valve is abnormal.   6. The aortic valve is tricuspid. Aortic valve regurgitation is mild. No  aortic stenosis is present.   7. The inferior vena cava is normal in size with greater than 50%  respiratory variability, suggesting right atrial pressure of 3 mmHg.     Patient Profile     89 y.o. female  with hx of multiple myeloma, renal insuff. Recent onset chest discomfort.   CTof chest showed advanced atherosclerosis of aorta Echo in April 2024 LVEF 45 to 50%  RVEF normal    Now with signifciant troponin elevation (1072, 1030, 1040, 1086)   EKG without acute changes    Assessment & Plan    1  Elevated troponin   Pt with chest prssure, sob, some dizziness.   I have reviewed echo images that were just done   RV is dilated   New compared to echo in April    Need to r/o PE which could explain above picture     MM increases hypercoag risk  Keep on heparin   BP is OK for now    Follow on tele Will get official on report   2  Multiple myeloma  Follows in oncology clinic at [facility]   Getting Daratumumab, Revlimid and Dexa     4  Hx HTN  Follow     5  HL   Not on a statin  5   Renal      For questions or updates, please contact [facility] Please consult [email or web address] for contact info under        Signed, Dietrich Pates, MD  07/03/2022, 12:42 PM

## 2022-07-03 NOTE — Progress Notes (Signed)
[facility] PROGRESS NOTE   AALLIYAH RICKETTS ZOX:[id number] DOB: 10-Jul-1931 DOA: 07/02/2022  PCP: Benetta Spar, MD  Brief History/Interval Summary: 89 year old female with history of multiple myeloma currently on chemotherapy followed by Dr. 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  She was diuresed in the hospital and discharged home after diuresing about 5 L.  Scented with left-sided chest pain.  Troponins were noted to be elevated.  She was hospitalized and then transferred to [facility]  Consultants: Cardiology  Procedures: None yet    Subjective/Interval History: Patient mildly distracted.  Denies any chest pain or shortness of breath this morning.  No nausea or vomiting.    Assessment/Plan:  NSTEMI Patient presented with chest pain.  Seen by cardiology.  Placed on IV heparin.  Chest pain has resolved. Continue with aspirin, beta-blocker.  Not noted to be on statin.  Lipid panel is pending.  No intolerance noted to statins.  Will initiate atorvastatin. Cardiology to determine further course of action.  Chronic systolic CHF Recent echocardiogram on June 01, 2022 showed LVEF to be 45 to 50%.  Global hypokinesis noted.  Grade 1 diastolic dysfunction noted.  No significant valvular disease was noted. Patient recently hospitalized for CHF and was discharged on furosemide spironolactone and [place].  These medications currently on hold primarily because of elevated creatinine. She does have mild pedal edema bilaterally.  Hyperlipidemia Lipid panel is pending.    Essential hypertension Noted to  be on metoprolol which is being continued.  Losartan is on hold due to elevated creatinine.  Acute kidney injury Does not appear to have history of CKD.  Did have mildly elevated creatinine when she was discharged from the hospital in April after being managed for CHF.  She was placed on diuretics as discussed above.  Creatinine noted to be 1.63 yesterday.  Labs are pending from this morning.  Monitor urine output.  Avoid nephrotoxic agents.  History of multiple myeloma Follows with medical oncology Dr. Ellin Saba. Holding weekly dexamethasone for now (normally taken on Wed) Currently on a 7 day hold for lenalidomide per Dr. Ellin Saba, was to resume on Mon 5/13   Mild hyperglycemia Likely due to steroids.  Monitor periodically.  HbA1c 6.1.  Low TSH Check free T4.  DVT Prophylaxis: On IV heparin Code Status: DNR Family Communication: No family at bedside.  Discussed with her son Disposition Plan: Hopefully return home when improved  Status is: Inpatient Remains inpatient appropriate because: NSTEMI      Medications: Scheduled:  acyclovir  400 mg Oral BID   aspirin  81 mg Oral Daily   Chlorhexidine Gluconate Cloth  6 each Topical Daily   metoprolol succinate  12.5 mg Oral Daily   pantoprazole  40 mg Oral QPM   Continuous:  heparin 750 Units/hr (07/03/22 0411)   WJX:BJYNWGNFAOZHY **OR** acetaminophen, bisacodyl, HYDROcodone-acetaminophen, HYDROmorphone (DILAUDID) injection, nitroGLYCERIN, ondansetron **OR** ondansetron (ZOFRAN) IV, traZODone  Antibiotics: Anti-infectives (From admission, onward)    Start     Dose/Rate Route Frequency Ordered Stop   07/02/22 2200  acyclovir (ZOVIRAX) tablet 400 mg        400 mg Oral 2 times daily 07/02/22 1603     07/02/22 1530  ertapenem (INVANZ) 1,000 mg in  sodium chloride 0.9 % 100 mL IVPB  Status:  Discontinued        1 g 200 mL/hr over 30 Minutes Intravenous  Once 07/02/22 1525 07/02/22 1527       Objective:  Vital  Signs  Vitals:   07/02/22 1855 07/02/22 2033 07/03/22 0015 07/03/22 0416  BP: 92/63 (!) 106/49 (!) 105/48 (!) 116/57  Pulse:  85 88 87  Resp: 20 20 18 18   Temp: 98.4 F (36.9 C) 98.5 F (36.9 C) 98.1 F (36.7 C) 97.9 F (36.6 C)  TempSrc: Oral Oral Oral Oral  SpO2:  96% 93% 93%  Weight:      Height:        Intake/Output Summary (Last 24 hours) at 07/03/2022 0854 Last data filed at 07/03/2022 0411 Gross per 24 hour  Intake 131.75 ml  Output --  Net 131.75 ml   Filed Weights   07/02/22 1107  Weight: 57.2 kg    General appearance: Awake alert.  In no distress Resp: Clear to auscultation bilaterally.  Normal effort Cardio: S1-S2 is normal regular.  No S3-S4.  No rubs murmurs or bruit GI: Abdomen is soft.  Nontender nondistended.  Bowel sounds are present normal.  No masses organomegaly Extremities: Mild edema bilateral lower extremities Neurologic: Alert and oriented x3.  No focal neurological deficits.    Lab Results:  Data Reviewed: I have personally reviewed following labs and reports of the imaging studies  CBC: Recent Labs  Lab 07/02/22 1248  WBC 5.3  HGB 12.6  HCT 39.1  MCV 85.0  PLT 330    Basic Metabolic Panel: Recent Labs  Lab 07/02/22 1248  NA 142  K 3.5  CL 106  CO2 23  GLUCOSE 131*  BUN 35*  CREATININE 1.63*  CALCIUM 9.2    GFR: Estimated Creatinine Clearance: 18.5 mL/min (A) (by C-G formula based on SCr of 1.63 mg/dL (H)).   HbA1C: Recent Labs    07/02/22 1148  HGBA1C 6.1*    Lipid Profile: No results for input(s): "CHOL", "HDL", "LDLCALC", "TRIG", "CHOLHDL", "LDLDIRECT" in the last 72 hours.  Thyroid Function Tests: Recent Labs    07/02/22 1442  TSH 0.331*    Radiology Studies: DG CHEST PORT 1 VIEW  Result Date: 07/03/2022 CLINICAL DATA:  Atelectasis. EXAM: PORTABLE CHEST 1 VIEW COMPARISON:  07/02/2022 FINDINGS: The lungs are clear without focal pneumonia, edema, pneumothorax or pleural effusion. Cardiopericardial  silhouette is at upper limits of normal for size. Right Port-A-Cath again noted. Telemetry leads overlie the chest. IMPRESSION: No active disease. Electronically Signed   By: Kennith Center M.D.   On: 07/03/2022 08:49   DG Chest 2 View  Result Date: 07/02/2022 CLINICAL DATA:  Chest pain and shortness of breath EXAM: CHEST - 2 VIEW COMPARISON:  Chest radiograph dated 05/31/2022 FINDINGS: Lines/tubes: Right chest wall port tip projects over the superior cavoatrial junction. Chest: Well inflated lungs. Focal opacity along the posterior lung base. Rounded radiodensity projecting over the right mid lung likely corresponds to a monitoring patch. Pleura: No pneumothorax or pleural effusion. Heart/mediastinum: The heart size and mediastinal contours are within normal limits. Bones: No acute osseous abnormality. Surgical clips project over the right neck and upper abdomen. IMPRESSION: Focal opacity along the posterior lung base may represent atelectasis, aspiration, or pneumonia. Electronically Signed   By: Agustin Cree M.D.   On: 07/02/2022 11:35       LOS: 1 day   Yumiko Alkins  [facility] Pager on [email or web address]  07/03/2022,  8:54 AM

## 2022-07-03 NOTE — Progress Notes (Addendum)
ANTICOAGULATION CONSULT NOTE - follow-up  Pharmacy Consult for heparin Indication: chest pain/ACS  Allergies  Allergen Reactions   Penicillins Rash    Patient Measurements: Height: 5' (152.4 cm) Weight: 57.2 kg (126 lb) IBW/kg (Calculated) : 45.5 Heparin Dosing Weight: 57 kg  Vital Signs: Temp: 97.9 F (36.6 C) (05/11 0416) Temp Source: Oral (05/11 0416) BP: 116/57 (05/11 0416) Pulse Rate: 87 (05/11 0416)  Labs: Recent Labs    07/02/22 1248 07/02/22 1442 07/02/22 1712 07/02/22 2012 07/02/22 2300  HGB 12.6  --   --   --   --   HCT 39.1  --   --   --   --   PLT 330  --   --   --   --   HEPARINUNFRC  --   --   --   --  0.22*  CREATININE 1.63*  --   --   --   --   TROPONINIHS 1,072* 1,030* 1,040* 1,086*  --      Estimated Creatinine Clearance: 18.5 mL/min (A) (by C-G formula based on SCr of 1.63 mg/dL (H)).   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   07/01/2022   acyclovir (ZOVIRAX) 400 MG tablet Take 1 tablet (400 mg total) by mouth 2 (two) times daily. 60 tablet 6 07/01/2022   aspirin 81 MG tablet Take 81 mg by mouth daily.    07/01/2022   dapagliflozin propanediol (FARXIGA) 10 MG TABS tablet Take 1 tablet (10 mg total) by mouth daily. 30 tablet 1 07/01/2022   dexamethasone (DECADRON) 4 MG tablet Take 20 mg (5 tablets) by mouth weekly every Wednesday morning 20 tablet 6 06/23/2022   furosemide (LASIX) 40 MG tablet Take 1 tablet (40 mg total) by mouth daily. 30 tablet 1 07/01/2022   KLOR-CON M20 20 MEQ tablet NEW PRESCRIPTION REQUEST: TAKE TWO TABLETS BY MOUTH DAILY (Patient taking differently: Take 20 mEq by mouth daily. Take 2 tablets by mouth daily) 180 tablet 3 06/23/2022   lenalidomide (REVLIMID) 15 MG capsule Take 1 capsule (15 mg total) by mouth daily. Take for 21 days on, 7 days off. 21 capsule 0 06/29/2022   losartan (COZAAR) 25 MG tablet Take 0.5 tablets (12.5 mg total) by mouth daily. 30 tablet 0 07/01/2022   metoprolol succinate (TOPROL-XL) 25 MG 24 hr tablet Take 0.5 tablets (12.5 mg total) by mouth daily. 30 tablet 0 07/01/2022 at 1330   spironolactone (ALDACTONE) 25 MG tablet Take 0.5 tablets (12.5 mg total) by mouth daily. 30 tablet 0 07/01/2022   ketorolac (TORADOL)  10 MG tablet Take 1 tablet (10 mg total) by mouth every 6 (six) hours as needed. 30 tablet 0     Assessment: Pharmacy consulted to dose heparin in patient with chest pain/ACS. Patient is not on anticoagulation prior to admission.  CBC WNL. Heparin level subtherapeutic at 0.19. No issues with infusion or bleeding noted.   Goal of Therapy:  Heparin level 0.3-0.7 units/ml Monitor platelets by anticoagulation protocol: Yes   Plan:  Give 1250 units bolus x1 Increase heparin infusion to 850 units/hr Check anti-Xa level in 8 hours and daily while on heparin Continue to monitor H&H and platelets Monitor for s/sx of bleeding   Andreas Ohm, PharmD Pharmacy Resident  07/03/2022 8:51 AM

## 2022-07-03 NOTE — Consult Note (Addendum)
NAME:  Leslie Williamson, MRN:  [id number], DOB:  10/22/1931, LOS: 1 ADMISSION DATE:  07/02/2022, CONSULTATION DATE:  07/03/2022 REFERRING MD:  Dr. Julian Reil, CHIEF COMPLAINT:  Pulmonary Embolism   History of Present Illness:  89 year old female presents to ED on 5/10 with reported left sided chest pain. EKG with ECG shows sinus rhythm with rightward axis, poor R wave progression rule out old anterior infarct pattern, and nonspecific ST changes. Troponin 1072. Cardiology Consulted. Believed to be NSTEMI. Started on Heparin gtt. ECHO with RV dilatation. EF 50%, mild LVH. Decision made to get CT PE study. Which revealed acute PE with right heart strain (RV/LV 1.3). Pulmonary Consulted for evaluation.   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   Objective   Blood pressure 108/64, pulse 91, temperature 98.1 F (36.7 C), temperature source Oral, resp. rate 17, height 5' (1.524 m), weight 57.2 kg, SpO2 92 %.        Intake/Output Summary (Last 24 hours) at 07/03/2022 2020 Last data filed at 07/03/2022 1831 Gross per 24 hour  Intake 258.4 ml  Output --  Net 258.4 ml   Filed Weights   07/02/22 1107  Weight: 57.2 kg    Examination: General: Elderly female, lying in bed, no distress  HENT: MMM Lungs: Clear breath sounds, no use of accessory muscles  Cardiovascular: RRR, HR 72, no mRG Abdomen: soft, non-tender, active bowel sounds  Extremities: -edema Neuro: alert, oriented, follows commands  GU: deffered   Resolved Hospital Problem list       Assessment & Plan:   Acute submassive PE with RV/LV 1.3 - Patient on room air, denies SOB or chest Pain  - ECHO with RV dilation, EF 50% Plan - Continue heparin gtt  - Obtain LE dopplers  - At this time do not recommend thrombectomy/lytics >> have discussed with family and they agree as well    Best Practice (right click and "Reselect all SmartList Selections" daily)   Per Primary   Labs   CBC: Recent Labs  Lab 07/02/22 1248 07/03/22 1018  WBC 5.3 9.3  HGB 12.6 11.3*  HCT 39.1 34.5*  MCV 85.0 83.3  PLT 330 342    Basic Metabolic Panel: Recent Labs  Lab 07/02/22 1248 07/03/22 1018  NA 142 141  K 3.5 3.4*  CL 106 107  CO2 23 20*  GLUCOSE 131* 144*  BUN 35* 30*  CREATININE 1.63* 1.54*  CALCIUM 9.2 8.8*   GFR: Estimated Creatinine Clearance: 19.6 mL/min (A) (by C-G formula based on SCr of 1.54 mg/dL (H)). Recent Labs  Lab 07/02/22 1248 07/03/22 1018  WBC 5.3 9.3    Liver Function Tests: No results for input(s): "AST", "ALT", "ALKPHOS", "BILITOT", "PROT", "ALBUMIN" in the last 168 hours. No results for input(s): "LIPASE", "AMYLASE" in the last 168 hours. No results for input(s): "AMMONIA" in the last 168 hours.  ABG No results found for: "PHART", "PCO2ART", "PO2ART", "HCO3", "TCO2", "ACIDBASEDEF", "O2SAT"   Coagulation Profile: No results for input(s): "INR", "PROTIME" in the last 168 hours.  Cardiac Enzymes: No results for input(s): "CKTOTAL", "CKMB", "CKMBINDEX", "TROPONINI"  in the last 168 hours.  HbA1C: Hgb A1c MFr Bld  Date/Time Value Ref Range Status  07/02/2022 11:48 AM 6.1 (H) 4.8 - 5.6 % Final    Comment:    (NOTE) Pre diabetes:          5.7%-6.4%  Diabetes:              >6.4%  Glycemic control for   <7.0% adults with diabetes     CBG: No results for input(s): "GLUCAP" in the last 168 hours.  Review of Systems:   Denies SOB or chest pain   Past Medical History:  She,  has a past medical history of Anxiety, Aortic  atherosclerosis (HCC), Complication of anesthesia, Depression, Heart failure with mildly reduced ejection fraction (HFmrEF) (HCC), Hypercholesteremia, Hypertension, Hypokalemia, Iron deficiency anemia, Left hip pain, NHL (non-Hodgkin's lymphoma) (HCC) (12/17/2010), Obesity, Primary thrombocytosis (HCC) (01/11/2006), PVD (peripheral vascular disease) (HCC), S/P splenectomy (01/01/2014), 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), and Vertigo, labyrinthine.   Surgical History:   Past Surgical History:  Procedure Laterality Date   CATARACT EXTRACTION W/PHACO Left 11/03/2015   Procedure: CATARACT EXTRACTION PHACO AND INTRAOCULAR LENS PLACEMENT (IOC);  Surgeon: Gemma Payor, MD;  Location: [facility];  Service: Ophthalmology;  Laterality: Left;  CDE: 7.74   CATARACT EXTRACTION W/PHACO Right 11/20/2015   Procedure: CATARACT EXTRACTION PHACO AND INTRAOCULAR LENS PLACEMENT ; CDE:  8.30;  Surgeon: Gemma Payor, MD;  Location: [facility];  Service: Ophthalmology;  Laterality: Right;   IR IMAGING GUIDED PORT INSERTION  11/04/2021   PORT-A-CATH REMOVAL       Social History:   reports that she has quit smoking. She has never used smokeless tobacco. She reports that she does not drink alcohol and does not use drugs.   Family History:  Her family history includes Diabetes in her mother.   Allergies Allergies  Allergen Reactions   Penicillins Rash     Home Medications  Prior to Admission medications   Medication Sig Start Date End Date Taking? Authorizing Provider  acetaminophen (TYLENOL) 500 MG tablet Take 1,000 mg by mouth every 6 (six) hours as needed for moderate pain.   Yes [provider]  acyclovir (ZOVIRAX) 400 MG tablet Take 1 tablet (400 mg total) by mouth 2 (two) times daily. 12/30/21  Yes Doreatha Massed, MD  aspirin 81 MG tablet Take 81 mg by mouth daily.   Yes [provider]  dapagliflozin propanediol (FARXIGA) 10 MG TABS tablet Take 1 tablet (10 mg total) by mouth daily.  06/05/22  Yes Burnadette Pop, MD  dexamethasone (DECADRON) 4 MG tablet Take 20 mg (5 tablets) by mouth weekly every Wednesday morning 03/24/22  Yes Doreatha Massed, MD  furosemide (LASIX) 40 MG tablet Take 1 tablet (40 mg total) by mouth daily. 06/05/22  Yes Adhikari, Willia Craze, MD  KLOR-CON M20 20 MEQ tablet NEW PRESCRIPTION REQUEST: TAKE TWO TABLETS BY MOUTH DAILY Patient taking differently: Take 20 mEq by mouth daily. Take 2 tablets by mouth daily 06/30/22  Yes Doreatha Massed, MD  lenalidomide (REVLIMID) 15 MG capsule Take 1 capsule (15 mg total) by mouth daily. Take for 21 days on, 7 days off. 06/14/22  Yes Doreatha Massed, MD  losartan (COZAAR) 25 MG tablet Take 0.5 tablets (12.5 mg total) by mouth daily. 06/05/22  Yes Burnadette Pop, MD  metoprolol succinate (TOPROL-XL) 25 MG 24 hr tablet Take 0.5 tablets (12.5 mg total) by mouth daily. 06/05/22  Yes Burnadette Pop, MD  spironolactone (ALDACTONE) 25 MG tablet Take  0.5 tablets (12.5 mg total) by mouth daily. 06/05/22  Yes Burnadette Pop, MD  ketorolac (TORADOL) 10 MG tablet Take 1 tablet (10 mg total) by mouth every 6 (six) hours as needed. 03/24/22   Doreatha Massed, MD     Time Spent: 45 minutes   Jovita Kussmaul, AGACNP-BC [facility]  PCCM Pgr: [phone]

## 2022-07-03 NOTE — Progress Notes (Signed)
ANTICOAGULATION CONSULT NOTE  Pharmacy Consult for heparin Indication: chest pain/ACS > bilateral PE  Allergies  Allergen Reactions   Penicillins Rash    Patient Measurements: Height: 5' (152.4 cm) Weight: 57.2 kg (126 lb) IBW/kg (Calculated) : 45.5 Heparin Dosing Weight: 57 kg  Vital Signs: Temp: 98.1 F (36.7 C) (05/11 2029) Temp Source: Oral (05/11 2029) BP: 115/84 (05/11 2029) Pulse Rate: 88 (05/11 2029)  Labs: Recent Labs    07/02/22 1248 07/02/22 1442 07/02/22 1712 07/02/22 2012 07/02/22 2300 07/03/22 1018 07/03/22 1900  HGB 12.6  --   --   --   --  11.3*  --   HCT 39.1  --   --   --   --  34.5*  --   PLT 330  --   --   --   --  342  --   HEPARINUNFRC  --   --   --   --  0.22* 0.19* 0.51  CREATININE 1.63*  --   --   --   --  1.54*  --   TROPONINIHS 1,072* 1,030* 1,040* 1,086*  --   --   --      Estimated Creatinine Clearance: 19.6 mL/min (A) (by C-G formula based on SCr of 1.54 mg/dL (H)).   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   07/01/2022   acyclovir (ZOVIRAX) 400 MG tablet Take 1 tablet (400 mg total) by mouth 2  (two) times daily. 60 tablet 6 07/01/2022   aspirin 81 MG tablet Take 81 mg by mouth daily.   07/01/2022   dapagliflozin propanediol (FARXIGA) 10 MG TABS tablet Take 1 tablet (10 mg total) by mouth daily. 30 tablet 1 07/01/2022   dexamethasone (DECADRON) 4 MG tablet Take 20 mg (5 tablets) by mouth weekly every Wednesday morning 20 tablet 6 06/23/2022   furosemide (LASIX) 40 MG tablet Take 1 tablet (40 mg total) by mouth daily. 30 tablet 1 07/01/2022   KLOR-CON M20 20 MEQ tablet NEW PRESCRIPTION REQUEST: TAKE TWO TABLETS BY MOUTH DAILY (Patient taking differently: Take 20 mEq by mouth daily. Take 2 tablets by mouth daily) 180 tablet 3 06/23/2022   lenalidomide (REVLIMID) 15 MG capsule Take 1 capsule (15 mg total) by mouth daily. Take for 21 days on, 7 days off. 21 capsule 0 06/29/2022   losartan (COZAAR) 25 MG tablet Take 0.5 tablets (12.5 mg total) by mouth daily. 30 tablet 0 07/01/2022   metoprolol succinate (TOPROL-XL) 25 MG 24 hr tablet Take 0.5 tablets (12.5 mg total) by mouth daily. 30  tablet 0 07/01/2022 at 1330   spironolactone (ALDACTONE) 25 MG tablet Take 0.5 tablets (12.5 mg total) by mouth daily. 30 tablet 0 07/01/2022   ketorolac (TORADOL) 10 MG tablet Take 1 tablet (10 mg total) by mouth every 6 (six) hours as needed. 30 tablet 0     Assessment: Pharmacy consulted to dose heparin in patient with chest pain/ACS, but now found to have bilateral PE with R heart strain (RV/LV ratio 1.3). Patient is not on anticoagulation prior to admission.  Heparin level is therapeutic at 0.51, on 850 units/hr. No s/sx of bleeding or infusion issues. Plan for duplex to rule of DVT.   Goal of Therapy:  Heparin level 0.3-0.7 units/ml Monitor platelets by anticoagulation protocol: Yes   Plan:  Continue heparin infusion at 850 units/hr Check anti-Xa level in 8 hours with AM labs and daily while on heparin Continue to monitor H&H and platelets Monitor for s/sx of bleeding   Thank you for allowing pharmacy to participate  in this patient's care,  Sherron Monday, PharmD, BCCCP Clinical Pharmacist  Phone: [phone] 07/03/2022 8:46 PM  Please check AMION for all [facility] Pharmacy phone numbers After 10:00 PM, call Main Pharmacy [phone]

## 2022-07-03 NOTE — Significant Event (Signed)
[facility] Night Coverage note:  Called by radiology, CTA chest reveals large bilateral PEs with RHS.  This would explain her clinical picture according to Dr. Tenny Craw, including trop elevations, RV dilation on the Echo this morning, etc.  Have let RN know, pt already on heparin gtt, she will contact pharmacy to see if there are any changes in rate to be made to heparin gtt now that diagnosis / reason for heparin gtt is PE instead of NSTEMI.  Have also reached out to PCCM to see if pt is a candidate for any sort of catheter directed lysis.  They will discuss it, but initial impression per them is that this is probably not indicated given stable BP, no O2 requirement.  Have ordered DVT US of BLE to look for source clot.

## 2022-07-04 ENCOUNTER — Inpatient Hospital Stay (HOSPITAL_COMMUNITY): Payer: Medicare Other

## 2022-07-04 DIAGNOSIS — I2609 Other pulmonary embolism with acute cor pulmonale: Secondary | ICD-10-CM

## 2022-07-04 DIAGNOSIS — Z86711 Personal history of pulmonary embolism: Secondary | ICD-10-CM | POA: Diagnosis not present

## 2022-07-04 DIAGNOSIS — I214 Non-ST elevation (NSTEMI) myocardial infarction: Secondary | ICD-10-CM | POA: Diagnosis not present

## 2022-07-04 LAB — BASIC METABOLIC PANEL
Anion gap: 9 (ref 5–15)
BUN: 26 mg/dL — ABNORMAL HIGH (ref 8–23)
CO2: 22 mmol/L (ref 22–32)
Calcium: 8.4 mg/dL — ABNORMAL LOW (ref 8.9–10.3)
Chloride: 108 mmol/L (ref 98–111)
Creatinine, Ser: 1.34 mg/dL — ABNORMAL HIGH (ref 0.44–1.00)
GFR, Estimated: 38 mL/min — ABNORMAL LOW (ref >60)
Glucose, Bld: 103 mg/dL — ABNORMAL HIGH (ref 70–99)
Potassium: 3.4 mmol/L — ABNORMAL LOW (ref 3.5–5.1)
Sodium: 139 mmol/L (ref 135–145)

## 2022-07-04 LAB — CBC
HCT: 30.8 % — ABNORMAL LOW (ref 36.0–46.0)
Hemoglobin: 9.8 g/dL — ABNORMAL LOW (ref 12.0–15.0)
MCH: 27.1 pg (ref 26.0–34.0)
MCHC: 31.8 g/dL (ref 30.0–36.0)
MCV: 85.1 fL (ref 80.0–100.0)
Platelets: 359 10*3/uL (ref 150–400)
RBC: 3.62 MIL/uL — ABNORMAL LOW (ref 3.87–5.11)
RDW: 19.6 % — ABNORMAL HIGH (ref 11.5–15.5)
WBC: 7.5 10*3/uL (ref 4.0–10.5)
nRBC: 0 % (ref 0.0–0.2)

## 2022-07-04 LAB — LIPID PANEL
Cholesterol: 119 mg/dL (ref 0–200)
HDL: 35 mg/dL — ABNORMAL LOW (ref >40)
LDL Cholesterol: 68 mg/dL (ref 0–99)
Total CHOL/HDL Ratio: 3.4 RATIO
Triglycerides: 78 mg/dL (ref <150)
VLDL: 16 mg/dL (ref 0–40)

## 2022-07-04 LAB — HEPARIN LEVEL (UNFRACTIONATED): Heparin Unfractionated: 0.11 IU/mL — ABNORMAL LOW (ref 0.30–0.70)

## 2022-07-04 LAB — MAGNESIUM: Magnesium: 1.8 mg/dL (ref 1.7–2.4)

## 2022-07-04 MED ORDER — MAGNESIUM SULFATE 2 GM/50ML IV SOLN
2.0000 g | Freq: Once | INTRAVENOUS | Status: AC
  Administered 2022-07-04: 2 g via INTRAVENOUS
  Filled 2022-07-04: qty 50

## 2022-07-04 MED ORDER — APIXABAN 5 MG PO TABS
10.0000 mg | ORAL_TABLET | Freq: Two times a day (BID) | ORAL | Status: DC
  Administered 2022-07-04 – 2022-07-05 (×3): 10 mg via ORAL
  Filled 2022-07-04 (×3): qty 2

## 2022-07-04 MED ORDER — APIXABAN 5 MG PO TABS: 5.0000 mg | ORAL_TABLET | Freq: Two times a day (BID) | ORAL | Status: DC

## 2022-07-04 MED ORDER — POTASSIUM CHLORIDE CRYS ER 20 MEQ PO TBCR
40.0000 meq | EXTENDED_RELEASE_TABLET | Freq: Once | ORAL | Status: AC
  Administered 2022-07-04: 40 meq via ORAL
  Filled 2022-07-04: qty 2

## 2022-07-04 MED ORDER — HEPARIN BOLUS VIA INFUSION
1000.0000 [IU] | Freq: Once | INTRAVENOUS | Status: AC
  Administered 2022-07-04: 1000 [IU] via INTRAVENOUS
  Filled 2022-07-04: qty 1000

## 2022-07-04 NOTE — Progress Notes (Signed)
[facility] PROGRESS NOTE   Leslie Williamson UJW:[id number] DOB: [birth year] DOA: 07/02/2022  PCP: Benetta Spar, MD  Brief History/Interval Summary: 89 year old female with history of multiple myeloma currently on chemotherapy followed by Dr. 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  She was diuresed in the hospital and discharged home after diuresing about 5 L.  Scented with left-sided chest pain.  Troponins were noted to be elevated.  She was hospitalized and then transferred to [facility].  Consultants: Cardiology  Procedures: None yet    Subjective/Interval History: Patient remains distracted.  But denies any chest pain or shortness of breath.  No nausea or vomiting.  No family at bedside.    Assessment/Plan:  Acute pulmonary embolism Patient presented with chest pain.  Seen by cardiology.  Placed on IV heparin.  Chest pain has resolved. Echocardiogram shows EF to be 50% but more concerningly it showed diminished RV function which is new compared to previous echocardiogram.  In view of this patient underwent CT angiogram which shows bilateral pulmonary embolism. Seen by pulmonology.  No invasive interventions to be pursued. Discussed with cardiology.  They recommend repeating echocardiogram in a few months to make sure her LV function has recovered. Elevated troponin levels most likely due to PE. LDL noted to be 68. Transition to Eliquis today.  Normocytic anemia Drop in hemoglobin noted.  No evidence of overt bleeding.  Patient does have a history of multiple myeloma and is on chemotherapy for  same. Previous lab values reviewed.  Hemoglobin was 9.7 in April 2024.  Patient currently on IV heparin.  Will change to Eliquis and then recheck hemoglobin tomorrow morning.  Chronic systolic CHF Recent echocardiogram on June 01, 2022 showed LVEF to be 45 to 50%.  Global hypokinesis noted.  Grade 1 diastolic dysfunction noted.  No significant valvular disease was noted. Patient recently hospitalized for CHF and was discharged on furosemide spironolactone and [place].  These medications currently on hold primarily because of elevated creatinine. Renal function appears to be improving.  Might be able to resume some of these medications soon. She does have mild pedal edema bilaterally.  Hyperlipidemia LDL is 68.  Patient is on statin.  Essential hypertension Noted to be on metoprolol which is being continued.  Losartan is on hold due to elevated creatinine.  Acute kidney injury Does not appear to have history of CKD.  Did have mildly elevated creatinine when she was discharged from the hospital in April after being managed for CHF.  She was placed on diuretics as discussed above.   Elevated creatinine likely due to PE.  Improvement noted.  Continue to monitor.  Avoid nephrotoxic agents.    History of multiple myeloma Follows with medical oncology Dr. Ellin Saba. Holding weekly dexamethasone for now (normally taken on Wed) Currently on a 7 day hold for lenalidomide per Dr. Ellin Saba, was to resume on Mon 5/13   Mild hyperglycemia Likely due to steroids.  Monitor periodically.  HbA1c 6.1.  Low TSH TSH 0.21.  Free T4 11.22.  Could be suggestive of sick euthyroid considering recent hospitalization.  No overt symptoms of hyperthyroidism.  Recommend that these levels be rechecked in 3 to 4 weeks.  DVT Prophylaxis: On IV heparin Code  Status: DNR Family Communication: No family at bedside.  Son was updated yesterday.  Will do so again today. Disposition Plan: Hopefully return home when  improved  Status is: Inpatient Remains inpatient appropriate because: NSTEMI      Medications: Scheduled:  acyclovir  400 mg Oral BID   aspirin  81 mg Oral Daily   atorvastatin  20 mg Oral Daily   Chlorhexidine Gluconate Cloth  6 each Topical Daily   metoprolol succinate  12.5 mg Oral Daily   pantoprazole  40 mg Oral QPM   Continuous:  heparin 1,100 Units/hr (07/04/22 0559)   ZOX:[id number] **OR** acetaminophen, bisacodyl, HYDROcodone-acetaminophen, HYDROmorphone (DILAUDID) injection, ondansetron **OR** ondansetron (ZOFRAN) IV, traZODone  Antibiotics: Anti-infectives (From admission, onward)    Start     Dose/Rate Route Frequency Ordered Stop   07/02/22 2200  acyclovir (ZOVIRAX) tablet 400 mg        400 mg Oral 2 times daily 07/02/22 1603     07/02/22 1530  ertapenem (INVANZ) 1,000 mg in sodium chloride 0.9 % 100 mL IVPB  Status:  Discontinued        1 g 200 mL/hr over 30 Minutes Intravenous  Once 07/02/22 1525 07/02/22 1527       Objective:  Vital Signs  Vitals:   07/03/22 1120 07/03/22 2029 07/03/22 2207 07/04/22 0355  BP: 108/64 115/84  119/64  Pulse: 91 88 91 88  Resp: 17 16  18   Temp: 98.1 F (36.7 C) 98.1 F (36.7 C)  98.4 F (36.9 C)  TempSrc: Oral Oral  Oral  SpO2: 92% 93% 98% 93%  Weight:    57.6 kg  Height:        Intake/Output Summary (Last 24 hours) at 07/04/2022 0923 Last data filed at 07/04/2022 0500 Gross per 24 hour  Intake 335.4 ml  Output --  Net 335.4 ml    Filed Weights   07/02/22 1107 07/04/22 0355  Weight: 57.2 kg 57.6 kg    General appearance: Awake alert.  In no distress Resp: Clear to auscultation bilaterally.  Normal effort Cardio: S1-S2 is normal regular.  No S3-S4.  No rubs murmurs or bruit GI: Abdomen is soft.  Nontender nondistended.  Bowel sounds are present normal.  No masses organomegaly Extremities: Mild edema bilateral lower extremities. No obvious focal neurological deficits.  Lab Results:  Data Reviewed:  I have personally reviewed following labs and reports of the imaging studies  CBC: Recent Labs  Lab 07/02/22 1248 07/03/22 1018 07/04/22 0425  WBC 5.3 9.3 7.5  HGB 12.6 11.3* 9.8*  HCT 39.1 34.5* 30.8*  MCV 85.0 83.3 85.1  PLT 330 342 359     Basic Metabolic Panel: Recent Labs  Lab 07/02/22 1248 07/03/22 1018 07/04/22 0425  NA 142 141 139  K 3.5 3.4* 3.4*  CL 106 107 108  CO2 23 20* 22  GLUCOSE 131* 144* 103*  BUN 35* 30* 26*  CREATININE 1.63* 1.54* 1.34*  CALCIUM 9.2 8.8* 8.4*  MG  --   --  1.8     GFR: Estimated Creatinine Clearance: 22.6 mL/min (A) (by C-G formula based on SCr of 1.34 mg/dL (H)).   HbA1C: Recent Labs    07/02/22 1148  HGBA1C 6.1*     Lipid Profile: Recent Labs    07/04/22 0425  CHOL 119  HDL 35*  LDLCALC 68  TRIG 78  CHOLHDL 3.4    Thyroid Function Tests: Recent Labs    07/03/22 1018  TSH 0.210*  FREET4 1.22*  Radiology Studies: CT Angio Chest Pulmonary Embolism (PE) W or WO Contrast  Addendum Date: 07/03/2022   ADDENDUM REPORT: 07/03/2022 19:23 ADDENDUM: Critical Value/emergent results were called by telephone at the time of interpretation on 07/03/2022 at 7:23 pm to provider Marshell Levan, MD, who verbally acknowledged these results. Electronically Signed   By: Genevive Bi M.D.   On: 07/03/2022 19:23   Result Date: 07/03/2022 CLINICAL DATA:  Concern for acute pulmonary embolism. EXAM: CT ANGIOGRAPHY CHEST WITH CONTRAST TECHNIQUE: Multidetector CT imaging of the chest was performed using the standard protocol during bolus administration of intravenous contrast. Multiplanar CT image reconstructions and MIPs were obtained to evaluate the vascular anatomy. RADIATION DOSE REDUCTION: This exam was performed according to the departmental dose-optimization program which includes automated exposure control, adjustment of the mA and/or kV according to patient size and/or use of iterative reconstruction technique. CONTRAST:  75mL  OMNIPAQUE IOHEXOL 350 MG/ML SOLN COMPARISON:  PET-CT scan 11/05/2021, CT abdomen pelvis 05/31/2022 FINDINGS: Cardiovascular: Large tubular filling defects within the proximal pulmonary arteries to the LEFT upper lobe, LEFT lower lobe, RIGHT upper lobe and to lesser degree RIGHT lower lobe. These thrombi are occlusive. Overall clot burden is severe. There is evidence of RIGHT heart ventricular strain with the RV to LV ratio equal 1.3. Mediastinum/Nodes: Port in the anterior chest wall with tip in distal SVC. No axillary or supraclavicular adenopathy. No mediastinal or hilar adenopathy. No pericardial fluid. Esophagus normal. Lungs/Pleura: No pulmonary infarction. No pneumonia. No pleural fluid. No pneumothorax Upper Abdomen: Limited view of the liver, kidneys, pancreas are unremarkable. Normal adrenal glands. Musculoskeletal: Lucent lesions within the mid thoracic spine not changed from comparison exam. No active myeloma PET-CT scan 11/05/2021 Review of the MIP images confirms the above findings. IMPRESSION: Bilateral severe occlusive pulmonary emboli with RIGHT heart strain. Positive for acute PE with CT evidence of right heart strain (RV/LV Ratio = 1.3) consistent with at least submassive (intermediate risk) PE. The presence of right heart strain has been associated with an increased risk of morbidity and mortality. Please refer to the "Code PE Focused" order set in EPIC. Electronically Signed: By: Genevive Bi M.D. On: 07/03/2022 19:11   ECHOCARDIOGRAM LIMITED  Result Date: 07/03/2022    ECHOCARDIOGRAM LIMITED REPORT   Patient Name:   Leslie Williamson Date of Exam: 07/03/2022 Medical Rec #:  [id number]         Height:       60.0 in Accession #:    [id number]        Weight:       126.0 lb Date of Birth:  Feb 17, 1932         BSA:          1.534 m Patient Age:    89 years          BP:           116/57 mmHg Patient Gender: F                 HR:           87 bpm. Exam Location:  Inpatient Procedure: Limited Echo,  Color Doppler and Cardiac Doppler REPORT CONTAINS CRITICAL RESULT Indications:    NSTEMI  History:        Patient has no prior history of Echocardiogram examinations and                 Patient has prior history of Echocardiogram examinations, most  recent 06/01/2022. NSTEMI; Risk Factors:Dyslipidemia. Multiple                 myeloma.  Sonographer:    Milbert Coulter Referring Phys: [id number] Cleora Fleet  Sonographer Comments: Suboptimal apical window. Displacement of apical windows due to cardiac chamber changes. IMPRESSIONS  1. Compared to echo from April 2024, RV is dilated and function reduced.  2. Left ventricular ejection fraction, by estimation, is 50%. The left ventricle has mildly decreased function. There is mild left ventricular hypertrophy.  3. Right ventricular systolic function is moderately reduced. The right ventricular size is moderately enlarged.  4. Right atrial size was mildly dilated.  5. Trivial mitral valve regurgitation.  6. Tricuspid valve regurgitation is moderate to severe.  7. The aortic valve is tricuspid. Aortic valve regurgitation is mild.  8. The inferior vena cava is dilated in size with <50% respiratory variability, suggesting right atrial pressure of 15 mmHg. FINDINGS  Left Ventricle: Left ventricular ejection fraction, by estimation, is 50%. The left ventricle has mildly decreased function. The left ventricular internal cavity size was normal in size. There is mild left ventricular hypertrophy. Right Ventricle: The right ventricular size is moderately enlarged. Right vetricular wall thickness was not assessed. Right ventricular systolic function is moderately reduced. Left Atrium: Left atrial size was normal in size. Right Atrium: Right atrial size was mildly dilated. Pericardium: Trivial pericardial effusion is present. Mitral Valve: There is mild thickening of the mitral valve leaflet(s). Trivial mitral valve regurgitation. Tricuspid Valve: The tricuspid valve is  normal in structure. Tricuspid valve regurgitation is moderate to severe. Aortic Valve: The aortic valve is tricuspid. Aortic valve regurgitation is mild. Pulmonic Valve: The pulmonic valve was not well visualized. Aorta: The aortic root is normal in size and structure. Venous: The inferior vena cava is dilated in size with less than 50% respiratory variability, suggesting right atrial pressure of 15 mmHg. Additional Comments: Spectral Doppler performed. Color Doppler performed.  LEFT VENTRICLE PLAX 2D LVIDd:         3.20 cm LVIDs:         2.10 cm LV PW:         1.20 cm LV IVS:        1.20 cm  RIGHT VENTRICLE RV Basal diam:  3.50 cm RV Mid diam:    3.00 cm LEFT ATRIUM           Index        RIGHT ATRIUM           Index LA diam:      2.60 cm 1.70 cm/m   RA Area:     19.50 cm LA Vol (A4C): 31.7 ml 20.67 ml/m  RA Volume:   56.80 ml  37.03 ml/m   AORTA Ao Root diam: 3.20 cm TRICUSPID VALVE TR Peak grad:   35.8 mmHg TR Vmax:        299.00 cm/s Dietrich Pates MD Electronically signed by Dietrich Pates MD Signature Date/Time: 07/03/2022/3:09:50 PM    Final    DG CHEST PORT 1 VIEW  Result Date: 07/03/2022 CLINICAL DATA:  Atelectasis. EXAM: PORTABLE CHEST 1 VIEW COMPARISON:  07/02/2022 FINDINGS: The lungs are clear without focal pneumonia, edema, pneumothorax or pleural effusion. Cardiopericardial silhouette is at upper limits of normal for size. Right Port-A-Cath again noted. Telemetry leads overlie the chest. IMPRESSION: No active disease. Electronically Signed   By: Kennith Center M.D.   On: 07/03/2022 08:49   DG Chest 2 View  Result  Date: 07/02/2022 CLINICAL DATA:  Chest pain and shortness of breath EXAM: CHEST - 2 VIEW COMPARISON:  Chest radiograph dated 05/31/2022 FINDINGS: Lines/tubes: Right chest wall port tip projects over the superior cavoatrial junction. Chest: Well inflated lungs. Focal opacity along the posterior lung base. Rounded radiodensity projecting over the right mid lung likely corresponds to a  monitoring patch. Pleura: No pneumothorax or pleural effusion. Heart/mediastinum: The heart size and mediastinal contours are within normal limits. Bones: No acute osseous abnormality. Surgical clips project over the right neck and upper abdomen. IMPRESSION: Focal opacity along the posterior lung base may represent atelectasis, aspiration, or pneumonia. Electronically Signed   By: Agustin Cree M.D.   On: 07/02/2022 11:35       LOS: 2 days   Karmina Zufall Rito Ehrlich  [facility] Pager on [email or web address]  07/04/2022, 9:23 AM

## 2022-07-04 NOTE — Progress Notes (Signed)
ANTICOAGULATION CONSULT NOTE  Pharmacy Consult for heparin>rivaroxaban  Indication: chest pain/ACS > bilateral PE  Allergies  Allergen Reactions   Penicillins Rash    Patient Measurements: Height: 5' (152.4 cm) Weight: 57.6 kg (127 lb) IBW/kg (Calculated) : 45.5 Heparin Dosing Weight: 57 kg  Vital Signs: Temp: 98.4 F (36.9 C) (05/12 0355) Temp Source: Oral (05/12 0355) BP: 119/64 (05/12 0355) Pulse Rate: 88 (05/12 0355)  Labs: Recent Labs    07/02/22 1248 07/02/22 1442 07/02/22 1712 07/02/22 2012 07/02/22 2300 07/03/22 1018 07/03/22 1900 07/04/22 0425  HGB 12.6  --   --   --   --  11.3*  --  9.8*  HCT 39.1  --   --   --   --  34.5*  --  30.8*  PLT 330  --   --   --   --  342  --  359  HEPARINUNFRC  --   --   --   --    < > 0.19* 0.51 0.11*  CREATININE 1.63*  --   --   --   --  1.54*  --  1.34*  TROPONINIHS 1,072* 1,030* 1,040* 1,086*  --   --   --   --    < > = values in this interval not displayed.     Estimated Creatinine Clearance: 22.6 mL/min (A) (by C-G formula based on SCr of 1.34 mg/dL (H)).   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   07/01/2022   acyclovir (ZOVIRAX) 400 MG tablet Take 1 tablet (400 mg total) by mouth 2 (two) times daily. 60 tablet 6 07/01/2022   aspirin 81 MG tablet Take 81 mg by mouth daily.   07/01/2022   dapagliflozin propanediol (FARXIGA) 10 MG TABS tablet Take 1 tablet (10 mg total) by mouth daily. 30 tablet 1 07/01/2022   dexamethasone (DECADRON) 4 MG tablet Take 20 mg (5 tablets) by mouth weekly every Wednesday morning 20 tablet 6 06/23/2022   furosemide (LASIX) 40 MG tablet Take 1 tablet (40 mg total) by mouth daily. 30 tablet 1 07/01/2022   KLOR-CON M20 20 MEQ tablet NEW PRESCRIPTION REQUEST: TAKE TWO TABLETS BY MOUTH DAILY (Patient taking differently: Take 20 mEq by mouth daily. Take 2 tablets by mouth daily) 180 tablet 3 06/23/2022   lenalidomide (REVLIMID) 15 MG capsule Take 1 capsule (15 mg total) by mouth daily. Take for 21 days on, 7 days off. 21 capsule 0 06/29/2022   losartan (COZAAR) 25 MG tablet Take 0.5 tablets (12.5 mg total) by mouth  daily. 30 tablet 0 07/01/2022   metoprolol succinate (TOPROL-XL) 25 MG 24 hr tablet Take 0.5 tablets (12.5 mg total) by mouth daily. 30 tablet 0 07/01/2022 at 1330   spironolactone (ALDACTONE) 25 MG tablet Take 0.5 tablets (12.5 mg total) by mouth daily. 30 tablet 0 07/01/2022   ketorolac (TORADOL) 10 MG tablet Take 1 tablet (10 mg total) by mouth every 6 (six) hours as needed. 30 tablet 0     Assessment: Pharmacy consulted to dose heparin in patient with chest pain/ACS, but now found to have bilateral PE with R heart strain (RV/LV ratio 1.3). Patient is not on anticoagulation prior to admission.  Pharmacy consulted to transition to apixaban. No s/sx of bleeding or infusion issues. Hgb 9.8, plt 359.   Goal of Therapy:  Heparin level 0.3-0.7 units/ml Monitor platelets by anticoagulation protocol: Yes   Plan:  Stop IV heparin at the same time of apixaban administration  Start apixaban 10 mg po bid x7 days (until 5/18), followed by 5 mg po bid (starting  5/19) Continue to monitor H&H and platelets Monitor for s/sx of bleeding   Thank you for allowing pharmacy to participate in this patient's care,  Andreas Ohm, PharmD Pharmacy Resident  07/04/2022 9:34 AM

## 2022-07-04 NOTE — Progress Notes (Signed)
ANTICOAGULATION CONSULT NOTE - Follow Up Consult  Pharmacy Consult for heparin Indication: pulmonary embolus  Labs: Recent Labs    07/02/22 1248 07/02/22 1442 07/02/22 1712 07/02/22 2012 07/02/22 2300 07/03/22 1018 07/03/22 1900 07/04/22 0425  HGB 12.6  --   --   --   --  11.3*  --  9.8*  HCT 39.1  --   --   --   --  34.5*  --  30.8*  PLT 330  --   --   --   --  342  --  359  HEPARINUNFRC  --   --   --   --    < > 0.19* 0.51 0.11*  CREATININE 1.63*  --   --   --   --  1.54*  --  1.34*  TROPONINIHS 1,072* 1,030* 1,040* 1,086*  --   --   --   --    < > = values in this interval not displayed.    Assessment: 89yo female subtherapeutic on heparin after one level at goal; no infusion issues or signs of bleeding per RN though noted drop in Hgb (12.1>11.3>9.8).  Goal of Therapy:  Heparin level 0.3-0.7 units/ml   Plan:  1000 units heparin bolus. Increase heparin infusion by 4 units/kg/hr to 1100 units/hr. Check level in 8 hours.  Follow up plan to transition to DOAC.  Vernard Gambles, PharmD, BCPS 07/04/2022 5:51 AM

## 2022-07-04 NOTE — Progress Notes (Signed)
Rounding Note    Patient Name: MANILLA LUERAS Date of Encounter: 07/04/2022  [facility] Cardiologist: Dina Rich, MD   Subjective   PT sitting in chair taking sponge bath   No CP   No dizzienss   Inpatient Medications    Scheduled Meds:  acyclovir  400 mg Oral BID   aspirin  81 mg Oral Daily   atorvastatin  20 mg Oral Daily   Chlorhexidine Gluconate Cloth  6 each Topical Daily   metoprolol succinate  12.5 mg Oral Daily   pantoprazole  40 mg Oral QPM   Continuous Infusions:  heparin 1,100 Units/hr (07/04/22 0559)   PRN Meds: acetaminophen **OR** acetaminophen, bisacodyl, HYDROcodone-acetaminophen, HYDROmorphone (DILAUDID) injection, ondansetron **OR** ondansetron (ZOFRAN) IV, traZODone   Vital Signs    Vitals:   07/03/22 1120 07/03/22 2029 07/03/22 2207 07/04/22 0355  BP: 108/64 115/84  119/64  Pulse: 91 88 91 88  Resp: 17 16  18   Temp: 98.1 F (36.7 C) 98.1 F (36.7 C)  98.4 F (36.9 C)  TempSrc: Oral Oral  Oral  SpO2: 92% 93% 98% 93%  Weight:    57.6 kg  Height:        Intake/Output Summary (Last 24 hours) at 07/04/2022 0651 Last data filed at 07/04/2022 0500 Gross per 24 hour  Intake 335.4 ml  Output --  Net 335.4 ml      07/04/2022    3:55 AM 07/02/2022   11:07 AM 06/16/2022    1:39 PM  Last 3 Weights  Weight (lbs) 127 lb 126 lb 128 lb 3.2 oz  Weight (kg) 57.607 kg 57.153 kg 58.151 kg      Telemetry    SR  - Personally Reviewed    Physical Exam   GEN: Thin 89 yo in NAD  Neck: No JVD Cardiac: RRR  II/VI systolic murmur  Respiratory: Clear to auscultation  Ext  No LE edema   Labs    High Sensitivity Troponin:   Recent Labs  Lab 07/02/22 1248 07/02/22 1442 07/02/22 1712 07/02/22 2012  TROPONINIHS 1,072* 1,030* 1,040* 1,086*     Chemistry Recent Labs  Lab 07/02/22 1248 07/03/22 1018 07/04/22 0425  NA 142 141 139  K 3.5 3.4* 3.4*  CL 106 107 108  CO2 23 20* 22  GLUCOSE 131* 144* 103*  BUN 35* 30* 26*   CREATININE 1.63* 1.54* 1.34*  CALCIUM 9.2 8.8* 8.4*  MG  --   --  1.8  GFRNONAA 30* 32* 38*  ANIONGAP 13 14 9     Lipids  Recent Labs  Lab 07/04/22 0425  CHOL 119  TRIG 78  HDL 35*  LDLCALC 68  CHOLHDL 3.4    Hematology Recent Labs  Lab 07/02/22 1248 07/03/22 1018 07/04/22 0425  WBC 5.3 9.3 7.5  RBC 4.60 4.14 3.62*  HGB 12.6 11.3* 9.8*  HCT 39.1 34.5* 30.8*  MCV 85.0 83.3 85.1  MCH 27.4 27.3 27.1  MCHC 32.2 32.8 31.8  RDW 20.0* 19.9* 19.6*  PLT 330 342 359   Thyroid  Recent Labs  Lab 07/03/22 1018  TSH 0.210*  FREET4 1.22*    BNPNo results for input(s): "BNP", "PROBNP" in the last 168 hours.  DDimer No results for input(s): "DDIMER" in the last 168 hours.   Radiology    CT Angio Chest Pulmonary Embolism (PE) W or WO Contrast  Addendum Date: 07/03/2022   ADDENDUM REPORT: 07/03/2022 19:23 ADDENDUM: Critical Value/emergent results were called by telephone at the time  of interpretation on 07/03/2022 at 7:23 pm to provider Marshell Levan, MD, who verbally acknowledged these results. Electronically Signed   By: Genevive Bi M.D.   On: 07/03/2022 19:23   Result Date: 07/03/2022 CLINICAL DATA:  Concern for acute pulmonary embolism. EXAM: CT ANGIOGRAPHY CHEST WITH CONTRAST TECHNIQUE: Multidetector CT imaging of the chest was performed using the standard protocol during bolus administration of intravenous contrast. Multiplanar CT image reconstructions and MIPs were obtained to evaluate the vascular anatomy. RADIATION DOSE REDUCTION: This exam was performed according to the departmental dose-optimization program which includes automated exposure control, adjustment of the mA and/or kV according to patient size and/or use of iterative reconstruction technique. CONTRAST:  75mL OMNIPAQUE IOHEXOL 350 MG/ML SOLN COMPARISON:  PET-CT scan 11/05/2021, CT abdomen pelvis 05/31/2022 FINDINGS: Cardiovascular: Large tubular filling defects within the proximal pulmonary arteries to the LEFT upper  lobe, LEFT lower lobe, RIGHT upper lobe and to lesser degree RIGHT lower lobe. These thrombi are occlusive. Overall clot burden is severe. There is evidence of RIGHT heart ventricular strain with the RV to LV ratio equal 1.3. Mediastinum/Nodes: Port in the anterior chest wall with tip in distal SVC. No axillary or supraclavicular adenopathy. No mediastinal or hilar adenopathy. No pericardial fluid. Esophagus normal. Lungs/Pleura: No pulmonary infarction. No pneumonia. No pleural fluid. No pneumothorax Upper Abdomen: Limited view of the liver, kidneys, pancreas are unremarkable. Normal adrenal glands. Musculoskeletal: Lucent lesions within the mid thoracic spine not changed from comparison exam. No active myeloma PET-CT scan 11/05/2021 Review of the MIP images confirms the above findings. IMPRESSION: Bilateral severe occlusive pulmonary emboli with RIGHT heart strain. Positive for acute PE with CT evidence of right heart strain (RV/LV Ratio = 1.3) consistent with at least submassive (intermediate risk) PE. The presence of right heart strain has been associated with an increased risk of morbidity and mortality. Please refer to the "Code PE Focused" order set in EPIC. Electronically Signed: By: Genevive Bi M.D. On: 07/03/2022 19:11   ECHOCARDIOGRAM LIMITED  Result Date: 07/03/2022    ECHOCARDIOGRAM LIMITED REPORT   Patient Name:   VENA CAPELLO Date of Exam: 07/03/2022 Medical Rec #:  [id number]         Height:       60.0 in Accession #:    [id number]        Weight:       126.0 lb Date of Birth:  12-20-1931         BSA:          1.534 m Patient Age:    89 years          BP:           116/57 mmHg Patient Gender: F                 HR:           87 bpm. Exam Location:  Inpatient Procedure: Limited Echo, Color Doppler and Cardiac Doppler REPORT CONTAINS CRITICAL RESULT Indications:    NSTEMI  History:        Patient has no prior history of Echocardiogram examinations and                 Patient has prior history  of Echocardiogram examinations, most                 recent 06/01/2022. NSTEMI; Risk Factors:Dyslipidemia. Multiple                 myeloma.  Sonographer:  Milbert Coulter Referring Phys: [id number] Cleora Fleet  Sonographer Comments: Suboptimal apical window. Displacement of apical windows due to cardiac chamber changes. IMPRESSIONS  1. Compared to echo from April 2024, RV is dilated and function reduced.  2. Left ventricular ejection fraction, by estimation, is 50%. The left ventricle has mildly decreased function. There is mild left ventricular hypertrophy.  3. Right ventricular systolic function is moderately reduced. The right ventricular size is moderately enlarged.  4. Right atrial size was mildly dilated.  5. Trivial mitral valve regurgitation.  6. Tricuspid valve regurgitation is moderate to severe.  7. The aortic valve is tricuspid. Aortic valve regurgitation is mild.  8. The inferior vena cava is dilated in size with <50% respiratory variability, suggesting right atrial pressure of 15 mmHg. FINDINGS  Left Ventricle: Left ventricular ejection fraction, by estimation, is 50%. The left ventricle has mildly decreased function. The left ventricular internal cavity size was normal in size. There is mild left ventricular hypertrophy. Right Ventricle: The right ventricular size is moderately enlarged. Right vetricular wall thickness was not assessed. Right ventricular systolic function is moderately reduced. Left Atrium: Left atrial size was normal in size. Right Atrium: Right atrial size was mildly dilated. Pericardium: Trivial pericardial effusion is present. Mitral Valve: There is mild thickening of the mitral valve leaflet(s). Trivial mitral valve regurgitation. Tricuspid Valve: The tricuspid valve is normal in structure. Tricuspid valve regurgitation is moderate to severe. Aortic Valve: The aortic valve is tricuspid. Aortic valve regurgitation is mild. Pulmonic Valve: The pulmonic valve was not well visualized.  Aorta: The aortic root is normal in size and structure. Venous: The inferior vena cava is dilated in size with less than 50% respiratory variability, suggesting right atrial pressure of 15 mmHg. Additional Comments: Spectral Doppler performed. Color Doppler performed.  LEFT VENTRICLE PLAX 2D LVIDd:         3.20 cm LVIDs:         2.10 cm LV PW:         1.20 cm LV IVS:        1.20 cm  RIGHT VENTRICLE RV Basal diam:  3.50 cm RV Mid diam:    3.00 cm LEFT ATRIUM           Index        RIGHT ATRIUM           Index LA diam:      2.60 cm 1.70 cm/m   RA Area:     19.50 cm LA Vol (A4C): 31.7 ml 20.67 ml/m  RA Volume:   56.80 ml  37.03 ml/m   AORTA Ao Root diam: 3.20 cm TRICUSPID VALVE TR Peak grad:   35.8 mmHg TR Vmax:        299.00 cm/s Dietrich Pates MD Electronically signed by Dietrich Pates MD Signature Date/Time: 07/03/2022/3:09:50 PM    Final    DG CHEST PORT 1 VIEW  Result Date: 07/03/2022 CLINICAL DATA:  Atelectasis. EXAM: PORTABLE CHEST 1 VIEW COMPARISON:  07/02/2022 FINDINGS: The lungs are clear without focal pneumonia, edema, pneumothorax or pleural effusion. Cardiopericardial silhouette is at upper limits of normal for size. Right Port-A-Cath again noted. Telemetry leads overlie the chest. IMPRESSION: No active disease. Electronically Signed   By: Kennith Center M.D.   On: 07/03/2022 08:49   DG Chest 2 View  Result Date: 07/02/2022 CLINICAL DATA:  Chest pain and shortness of breath EXAM: CHEST - 2 VIEW COMPARISON:  Chest radiograph dated 05/31/2022 FINDINGS: Lines/tubes: Right chest wall port  tip projects over the superior cavoatrial junction. Chest: Well inflated lungs. Focal opacity along the posterior lung base. Rounded radiodensity projecting over the right mid lung likely corresponds to a monitoring patch. Pleura: No pneumothorax or pleural effusion. Heart/mediastinum: The heart size and mediastinal contours are within normal limits. Bones: No acute osseous abnormality. Surgical clips project over the right  neck and upper abdomen. IMPRESSION: Focal opacity along the posterior lung base may represent atelectasis, aspiration, or pneumonia. Electronically Signed   By: Agustin Cree M.D.   On: 07/02/2022 11:35    Cardiac Studies   Echo 07/02/22 1. Compared to echo from April 2024, RV is dilated and function reduced.   2. Left ventricular ejection fraction, by estimation, is 50%. The left  ventricle has mildly decreased function. There is mild left ventricular  hypertrophy.   3. Right ventricular systolic function is moderately reduced. The right  ventricular size is moderately enlarged.   4. Right atrial size was mildly dilated.   5. Trivial mitral valve regurgitation.   6. Tricuspid valve regurgitation is moderate to severe.   7. The aortic valve is tricuspid. Aortic valve regurgitation is mild.   8. The inferior vena cava is dilated in size with <50% respiratory  variability, suggesting right atrial pressure of 15 mmHg.   Echocardiogram 06/01/2022:  1. Left ventricular ejection fraction, by estimation, is 45 to 50%. The  left ventricle has mildly decreased function. The left ventricle  demonstrates global hypokinesis. There is mild left ventricular  hypertrophy. Left ventricular diastolic parameters  are consistent with Grade I diastolic dysfunction (impaired relaxation).  Elevated left atrial pressure.   2. Right ventricular systolic function is normal. The right ventricular  size is normal. There is normal pulmonary artery systolic pressure.   3. Left atrial size was severely dilated.   4. The mitral valve is abnormal. Mild mitral valve regurgitation. No  evidence of mitral stenosis.   5. The tricuspid valve is abnormal.   6. The aortic valve is tricuspid. Aortic valve regurgitation is mild. No  aortic stenosis is present.   7. The inferior vena cava is normal in size with greater than 50%  respiratory variability, suggesting right atrial pressure of 3 mmHg.     Patient Profile     89  y.o. female  with hx of multiple myeloma, renal insuff. Recent onset chest discomfort.   CTof chest showed advanced atherosclerosis of aorta Echo in April 2024 LVEF 45 to 50%  RVEF normal    Now with signifciant troponin elevation (1072, 1030, 1040, 1086)   EKG without acute changes    Assessment & Plan    1  Pulmonary Embolism  CT yesterday showed large bilateral PE with RV strain.     CCM consulted   Continue anticoagulation BP has been OK     Take activities as tolerated    Avoid dehydration  Will make sure that she has follow up echo in clinic this summer   2  Elevated troponin Most likely due to demand on RV /LV from PE  3  HFrEF   Pt with mild LV dysfunction  and mod RV dysfunciton   Volume status is OK     Follow     4  Multiple myeloma  Follows in oncology clinic at [facility]   Getting Daratumumab, Revlimid and Dexa     5  Hx HTN  Follow  BP OK, not hypotensive  6  HL   Not on a statin  LDL  68  HDL 35  Trig 78         For questions or updates, please contact [facility] Please consult [email or web address] for contact info under        Signed, Dietrich Pates, MD  07/04/2022, 6:51 AM

## 2022-07-04 NOTE — Progress Notes (Addendum)
Per Dr. Charlott Rakes request I have sent a message to our office's scheduling team requesting a follow-up appointment, and our office will call the patient with this information. Dr. Tenny Craw recommends to add on on a day when she is in clinic next. She plans to review timing of echo in f/u.

## 2022-07-04 NOTE — Progress Notes (Signed)
Bilateral lower extremity venous duplex has been completed. Preliminary results can be found in CV Proc through chart review.   07/04/22 10:39 AM Olen Cordial RVT

## 2022-07-04 NOTE — Discharge Instructions (Signed)
Information on my medicine - ELIQUIS (apixaban)  This medication education was reviewed with me or my healthcare representative as part of my discharge preparation.  Why was Eliquis prescribed for you? Eliquis was prescribed to treat blood clots that may have been found in the veins of your legs (deep vein thrombosis) or in your lungs (pulmonary embolism) and to reduce the risk of them occurring again.  What do You need to know about Eliquis ? The starting dose is 10 mg (two 5 mg tablets) taken TWICE daily for the FIRST SEVEN (7) DAYS, then on 07/11/22  the dose is reduced to ONE 5 mg tablet taken TWICE daily.  Eliquis may be taken with or without food.   Try to take the dose about the same time in the morning and in the evening. If you have difficulty swallowing the tablet whole please discuss with your pharmacist how to take the medication safely.  Take Eliquis exactly as prescribed and DO NOT stop taking Eliquis without talking to the doctor who prescribed the medication.  Stopping may increase your risk of developing a new blood clot.  Refill your prescription before you run out.  After discharge, you should have regular check-up appointments with your healthcare provider that is prescribing your Eliquis.    What do you do if you miss a dose? If a dose of ELIQUIS is not taken at the scheduled time, take it as soon as possible on the same day and twice-daily administration should be resumed. The dose should not be doubled to make up for a missed dose.  Important Safety Information A possible side effect of Eliquis is bleeding. You should call your healthcare provider right away if you experience any of the following: Bleeding from an injury or your nose that does not stop. Unusual colored urine (red or dark brown) or unusual colored stools (red or black). Unusual bruising for unknown reasons. A serious fall or if you hit your head (even if there is no bleeding).  Some medicines  may interact with Eliquis and might increase your risk of bleeding or clotting while on Eliquis. To help avoid this, consult your healthcare provider or pharmacist prior to using any new prescription or non-prescription medications, including herbals, vitamins, non-steroidal anti-inflammatory drugs (NSAIDs) and supplements.  This website has more information on Eliquis (apixaban): http://[email or web address].com/eliquis/home

## 2022-07-05 ENCOUNTER — Other Ambulatory Visit (HOSPITAL_COMMUNITY): Payer: Self-pay

## 2022-07-05 DIAGNOSIS — I5022 Chronic systolic (congestive) heart failure: Secondary | ICD-10-CM

## 2022-07-05 DIAGNOSIS — R7989 Other specified abnormal findings of blood chemistry: Secondary | ICD-10-CM | POA: Diagnosis not present

## 2022-07-05 DIAGNOSIS — I2699 Other pulmonary embolism without acute cor pulmonale: Secondary | ICD-10-CM

## 2022-07-05 LAB — PHOSPHORUS: Phosphorus: 4.2 mg/dL (ref 2.5–4.6)

## 2022-07-05 LAB — BASIC METABOLIC PANEL
Anion gap: 14 (ref 5–15)
Anion gap: 7 (ref 5–15)
BUN: 32 mg/dL — ABNORMAL HIGH (ref 8–23)
BUN: 22 mg/dL (ref 8–23)
CO2: 20 mmol/L — ABNORMAL LOW (ref 22–32)
CO2: 24 mmol/L (ref 22–32)
Calcium: 8.5 mg/dL — ABNORMAL LOW (ref 8.9–10.3)
Calcium: 8.3 mg/dL — ABNORMAL LOW (ref 8.9–10.3)
Chloride: 107 mmol/L (ref 98–111)
Chloride: 108 mmol/L (ref 98–111)
Creatinine, Ser: 1.64 mg/dL — ABNORMAL HIGH (ref 0.44–1.00)
Creatinine, Ser: 1.18 mg/dL — ABNORMAL HIGH (ref 0.44–1.00)
GFR, Estimated: 30 mL/min — ABNORMAL LOW (ref >60)
GFR, Estimated: 44 mL/min — ABNORMAL LOW (ref >60)
Glucose, Bld: 131 mg/dL — ABNORMAL HIGH (ref 70–99)
Glucose, Bld: 97 mg/dL (ref 70–99)
Potassium: 3.1 mmol/L — ABNORMAL LOW (ref 3.5–5.1)
Potassium: 3.5 mmol/L (ref 3.5–5.1)
Sodium: 141 mmol/L (ref 135–145)
Sodium: 139 mmol/L (ref 135–145)

## 2022-07-05 LAB — T4, FREE: Free T4: 1.13 ng/dL — ABNORMAL HIGH (ref 0.61–1.12)

## 2022-07-05 LAB — CBC
HCT: 34.6 % — ABNORMAL LOW (ref 36.0–46.0)
HCT: 28.7 % — ABNORMAL LOW (ref 36.0–46.0)
Hemoglobin: 11.3 g/dL — ABNORMAL LOW (ref 12.0–15.0)
Hemoglobin: 9.1 g/dL — ABNORMAL LOW (ref 12.0–15.0)
MCH: 27.2 pg (ref 26.0–34.0)
MCH: 26.6 pg (ref 26.0–34.0)
MCHC: 32.7 g/dL (ref 30.0–36.0)
MCHC: 31.7 g/dL (ref 30.0–36.0)
MCV: 83.4 fL (ref 80.0–100.0)
MCV: 83.9 fL (ref 80.0–100.0)
Platelets: 333 10*3/uL (ref 150–400)
Platelets: 452 10*3/uL — ABNORMAL HIGH (ref 150–400)
RBC: 4.15 MIL/uL (ref 3.87–5.11)
RBC: 3.42 MIL/uL — ABNORMAL LOW (ref 3.87–5.11)
RDW: 19.2 % — ABNORMAL HIGH (ref 11.5–15.5)
RDW: 19.8 % — ABNORMAL HIGH (ref 11.5–15.5)
WBC: 7.4 10*3/uL (ref 4.0–10.5)
WBC: 5.6 10*3/uL (ref 4.0–10.5)
nRBC: 0.3 % — ABNORMAL HIGH (ref 0.0–0.2)
nRBC: 0.7 % — ABNORMAL HIGH (ref 0.0–0.2)

## 2022-07-05 LAB — ALBUMIN: Albumin: 2.7 g/dL — ABNORMAL LOW (ref 3.5–5.0)

## 2022-07-05 LAB — MAGNESIUM: Magnesium: 1.9 mg/dL (ref 1.7–2.4)

## 2022-07-05 MED ORDER — HEPARIN SOD (PORK) LOCK FLUSH 100 UNIT/ML IV SOLN
500.0000 [IU] | INTRAVENOUS | Status: AC | PRN
  Administered 2022-07-05: 500 [IU]

## 2022-07-05 MED ORDER — APIXABAN 5 MG PO TABS
5.0000 mg | ORAL_TABLET | Freq: Two times a day (BID) | ORAL | 1 refills | Status: DC
  Filled 2022-07-05: qty 60, 30d supply, fill #0

## 2022-07-05 MED ORDER — APIXABAN (ELIQUIS) VTE STARTER PACK (10MG AND 5MG)
ORAL_TABLET | ORAL | 0 refills | Status: DC
  Filled 2022-07-05: qty 74, 30d supply, fill #0

## 2022-07-05 MED ORDER — FUROSEMIDE 40 MG PO TABS: ORAL_TABLET | ORAL | 1 refills | Status: DC

## 2022-07-05 MED ORDER — POTASSIUM CHLORIDE CRYS ER 20 MEQ PO TBCR
40.0000 meq | EXTENDED_RELEASE_TABLET | Freq: Once | ORAL | Status: AC
  Administered 2022-07-05: 40 meq via ORAL
  Filled 2022-07-05: qty 2

## 2022-07-05 MED ORDER — POTASSIUM CHLORIDE CRYS ER 20 MEQ PO TBCR
EXTENDED_RELEASE_TABLET | ORAL | 3 refills | Status: DC
Start: 2022-07-05 — End: 2023-06-23

## 2022-07-05 NOTE — Evaluation (Signed)
Physical Therapy Evaluation Patient Details Name: Leslie Williamson MRN: [id number] DOB: Jul 15, 1931 Today's Date: 07/05/2022  History of Present Illness  Pt is a 89 year old female admitted on 07/02/22 with Chest Pain. Past medical history of multiple myeloma currently on chemotherapy followed by Dr. 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  Clinical Impression  Pt presents with admitting diagnosis above. Pt was able to ambulate independently in hallway and navigate stairs today. Pt presents at or near baseline mobility. Pt has no further acute PT needs and will be signing off. Pt reports that she was currently receiving HHPT and recommend that she resumes that upon DC. Re consult PT if mobility status changes.        Recommendations for follow up therapy are one component of a multi-disciplinary discharge planning process, led by the attending physician.  Recommendations may be updated based on patient status, additional functional criteria and insurance authorization.  Follow Up Recommendations       Assistance Recommended at Discharge PRN  Patient can return home with the following  Assistance with cooking/housework;Direct supervision/assist for medications management;Assist for transportation;Help with stairs or ramp for entrance    Equipment Recommendations None recommended by PT  Recommendations for Other Services       Functional Status Assessment Patient has had a recent decline in their functional status and demonstrates the ability to make significant improvements in function in a reasonable and predictable amount of time.      Precautions / Restrictions Precautions Precautions: Fall Restrictions Weight Bearing Restrictions: No      Mobility  Bed Mobility               General bed mobility comments: Pt received seated EOB.    Transfers Overall transfer level: Independent Equipment used: None                    Ambulation/Gait Ambulation/Gait assistance: Independent Gait Distance (Feet): 200 Feet Assistive device: None Gait Pattern/deviations: WFL(Within Functional Limits) Gait velocity: decreased     General Gait Details: Pt would occasionally reach for side rails but overall pretty steady.  Stairs Stairs: Yes Stairs assistance: Supervision Stair Management: One rail Right, Alternating pattern, Forwards Number of Stairs: 3 General stair comments: no LOB noted.  Wheelchair Mobility    Modified Rankin (Stroke Patients Only)       Balance Overall balance assessment: No apparent balance deficits (not formally assessed)                                           Pertinent Vitals/Pain Pain Assessment Pain Assessment: No/denies pain    Home Living Family/patient expects to be discharged to:: Private residence Living Arrangements: Children;Other relatives (Sister and Son) Available Help at Discharge: Family;Available 24 hours/day Type of Home: House Home Access: Stairs to enter Entrance Stairs-Rails: Right;Left;Can reach both Entrance Stairs-Number of Steps: 3   Home Layout: One level Home Equipment: Cane - single point;Shower seat;Grab bars - Chartered loss adjuster (2 wheels);Hand held shower head      Prior Function Prior Level of Function : Independent/Modified Independent;Driving  Mobility Comments: short distance community ambulator without AD ADLs Comments: Independent     Hand Dominance   Dominant Hand: Right    Extremity/Trunk Assessment   Upper Extremity Assessment Upper Extremity Assessment: Overall WFL  for tasks assessed    Lower Extremity Assessment Lower Extremity Assessment: Overall WFL for tasks assessed    Cervical / Trunk Assessment Cervical / Trunk Assessment: Normal  Communication   Communication: No difficulties  Cognition Arousal/Alertness: Awake/alert Behavior During Therapy: WFL for tasks assessed/performed Overall Cognitive Status: Within Functional Limits for tasks assessed                                          General Comments General comments (skin integrity, edema, etc.): VSS on RA    Exercises     Assessment/Plan    PT Assessment Patient does not need any further PT services  PT Problem List         PT Treatment Interventions      PT Goals (Current goals can be found in the Care Plan section)       Frequency       Co-evaluation               AM-PAC PT "6 Clicks" Mobility  Outcome Measure Help needed turning from your back to your side while in a flat bed without using bedrails?: None Help needed moving from lying on your back to sitting on the side of a flat bed without using bedrails?: None Help needed moving to and from a bed to a chair (including a wheelchair)?: None Help needed standing up from a chair using your arms (e.g., wheelchair or bedside chair)?: None Help needed to walk in hospital room?: None Help needed climbing 3-5 steps with a railing? : A Little 6 Click Score: 23    End of Session Equipment Utilized During Treatment: Gait belt Activity Tolerance: Patient tolerated treatment well Patient left: in bed;with call bell/phone within reach;with family/visitor present Nurse Communication: Mobility status PT Visit Diagnosis: Other abnormalities of gait and mobility (R26.89)    Time: 1610-9604 PT Time Calculation (min) (ACUTE ONLY): 19 min   Charges:   PT Evaluation $PT Eval Low Complexity: 1 Low PT Treatments $Gait Training: 8-22 mins        Shela Nevin, PT, DPT Acute Rehab Services [phone]    Leslie Williamson 07/05/2022, 9:28 AM

## 2022-07-05 NOTE — TOC Initial Note (Signed)
Transition of Care [facility]) - Initial/Assessment Note    Patient Details  Name: Leslie Williamson MRN: [id number] Date of Birth: 13-Oct-1931  Transition of Care [facility]) CM/SW Contact:    Ronny Bacon, RN Phone Number: 07/05/2022, 1:16 PM  Clinical Narrative:  Risk for reassessment completed. Patient from home presented with Chest pain. Spoke with patient at bedside to discuss home health options. Patient and son Atziry Markey, given Medicare home health choice list. [facility] chosen. Spoke with [facility] to arrange home health services for PT/OT. Patient lives with Jonny Ruiz (Son) and Lanora Manis (Sister). John stays home with patient and provides assistance with her ADL's and transportation needs.  No further TOC assistance needed.           Expected Discharge Plan: Home w Home Health Services Barriers to Discharge: No Barriers Identified   Patient Goals and CMS Choice Patient states their goals for this hospitalization and ongoing recovery are:: to go home CMS Medicare.gov Compare Post Acute Care list provided to:: Patient Choice offered to / list presented to : Patient, Adult Children      Expected Discharge Plan and Services In-house Referral: NA Discharge Planning Services: CM Consult Post Acute Care Choice: Home Health Living arrangements for the past 2 months: Single Family Home Expected Discharge Date: 07/05/22                 DME Agency: NA       HH Arranged: PT, OT HH Agency: [facility] Date HH Agency Contacted: 07/05/22 Time HH Agency Contacted: [id number] Representative spoke with at [facility] Agency: Tresa Endo  Prior Living Arrangements/Services Living arrangements for the past 2 months: Single Family Home Lives with:: Adult Children, Relatives Patient language and need for interpreter reviewed:: Yes Do you feel safe going back to the place where you live?: Yes      Need for Family Participation in Patient Care: Yes (Comment) Care giver support system in  place?: Yes (comment) Current home services: DME Gilmer Mor, RW) Criminal Activity/Legal Involvement Pertinent to Current Situation/Hospitalization: No - Comment as needed  Activities of Daily Living      Permission Sought/Granted Permission sought to share information with : Family Supports Marine scientist) Permission granted to share information with : Yes, Verbal Permission Granted  Share Information with NAME: Akeyla Muchnik (son)  Permission granted to share info w AGENCY: [facility]        Emotional Assessment Appearance:: Appears stated age Attitude/Demeanor/Rapport: Engaged Affect (typically observed): Calm Orientation: : Oriented to Self, Oriented to Place, Oriented to Situation, Oriented to  Time Alcohol / Substance Use: Not Applicable Psych Involvement: No (comment)  Admission diagnosis:  NSTEMI (non-ST elevated myocardial infarction) (HCC) [I21.4] Patient Active Problem List   Diagnosis Date Noted   Acute pulmonary embolism (HCC) 07/03/2022   NSTEMI (non-ST elevated myocardial infarction) (HCC) 07/02/2022   DNR (do not resuscitate) 07/02/2022   HFrEF 06/03/2022   CHF (congestive heart failure) (HCC) 05/31/2022   Drug-induced neutropenia (HCC) 01/20/2022   Multiple myeloma (HCC) 11/16/2021   Acute renal failure superimposed on stage 3a chronic kidney disease (HCC) 08/06/2021   Essential thrombocytosis (HCC) 08/06/2021   MGUS (monoclonal gammopathy of unknown significance) 08/06/2021   COVID-19 virus infection 08/05/2021   Acute respiratory failure with hypoxia (HCC) 08/05/2021   Occult blood positive stool 10/23/2020   Labyrinthine vertigo 10/26/2015   Elevated troponin 10/25/2015   Chest pain 10/25/2015   Vertigo    S/P splenectomy 01/01/2014   Claudication of both lower extremities (HCC) 11/06/2013  NHL (non-Hodgkin's lymphoma) (HCC) 12/17/2010   WEIGHT LOSS, ABNORMAL 02/05/2009   COLONIC POLYPS, ADENOMATOUS, HX OF 02/05/2009   GERD 08/08/2008   Iron deficiency anemia  06/12/2008   Anxiety state 03/20/2007   Hyperlipidemia 01/11/2006   JAK2+ MDP (myeloproliferative disorder) presenting with thrombocytosis (HCC) 01/11/2006   CARPAL TUNNEL SYNDROME 01/11/2006   Essential hypertension 01/11/2006   PVD- know Lt SFA disease, bilat PT and AT disease 01/11/2006   ALLERGIC RHINITIS 01/11/2006   SEBORRHEIC KERATOSIS 01/11/2006   PCP:  Benetta Spar, MD Pharmacy:   [facility], [place] - [address] Laurell Josephs [address] Laurell Josephs [address] [place] [address] Phone: [phone] Fax: [phone]  [facility] - [place], [place] - [address] [place] [address] Phone: [phone] Fax: [phone]  [facility], [place] - [address] [place] [address] Phone: [phone] Fax: [phone]  [facility] [address] [place] [address] Phone: [phone] Fax: [phone]     Social Determinants of Health (SDOH) Social History: SDOH Screenings   Food Insecurity: No Food Insecurity (07/03/2022)  Housing: Low Risk  (05/31/2022)  Transportation Needs: No Transportation Needs (07/03/2022)  Utilities: Not At Risk (07/03/2022)  Alcohol Screen: Low Risk  (12/31/2019)  Depression (PHQ2-9): Low Risk  (12/31/2019)  Financial Resource Strain: Low Risk  (12/31/2019)  Physical Activity: Inactive (12/31/2019)  Social Connections: Moderately Isolated (12/31/2019)  Stress: No Stress Concern Present (12/31/2019)  Tobacco Use: Medium Risk (07/02/2022)   SDOH Interventions:     Readmission Risk Interventions    07/05/2022    1:10 PM 07/05/2022    1:09 PM  Readmission Risk Prevention Plan  Transportation Screening  Complete  PCP or Specialist Appt within 3-5 Days Complete   HRI or Home Care Consult Complete   Social Work Consult for Recovery Care Planning/Counseling Complete   Palliative Care Screening Not Applicable   Medication  Review Oceanographer) Complete

## 2022-07-05 NOTE — Care Management Important Message (Signed)
Important Message  Patient Details  Name: Leslie Williamson MRN: [id number] Date of Birth: 16-Jul-1931   Medicare Important Message Given:  Yes     Renie Ora 07/05/2022, 11:16 AM

## 2022-07-05 NOTE — Discharge Summary (Signed)
[facility]  Physician Discharge Summary   Patient ID: Leslie Williamson MRN: [id number] DOB/AGE: 1931-06-19 89 y.o.  Admit date: 07/02/2022 Discharge date: 07/05/2022    PCP: Benetta Spar, MD  DISCHARGE DIAGNOSES:    Acute pulmonary embolism (HCC)   Hyperlipidemia   Essential hypertension   Acute renal failure superimposed on stage 3a chronic kidney disease (HCC)   Multiple myeloma (HCC)   HFrEF   RECOMMENDATIONS FOR OUTPATIENT FOLLOW UP: TSH and free T4 to be rechecked in 3 to 4 weeks.    Home Health: PT and OT Equipment/Devices: None  CODE STATUS: DNR  DISCHARGE CONDITION: fair  Diet recommendation: As before  INITIAL HISTORY:  89 year old female with history of multiple myeloma currently on chemotherapy followed by Dr. 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  She was diuresed in the hospital and discharged home after diuresing about 5 L.  Scented with left-sided chest pain.  Troponins were noted to be elevated.  She was hospitalized and then transferred to [facility].   Consultants: Cardiology   HOSPITAL COURSE:   Acute pulmonary embolism Patient presented with chest pain.  Significant elevation in troponin noted raising concern for NSTEMI.  Patient was seen by cardiology.  Placed on IV heparin. Echocardiogram shows EF to be 50% but more concerningly it showed diminished RV function which is new compared to previous echocardiogram.  In view of this patient underwent CT angiogram which shows bilateral pulmonary embolism.  This is thought to be responsible for her symptoms and elevation  in troponin level. Seen by pulmonology.  No invasive interventions to be pursued. Discussed with cardiology.  They recommend repeating echocardiogram in a few months to make sure her LV function has recovered. No DVT on lower extremity Doppler studies. Patient was transitioned to Eliquis.  Symptoms have resolved.  She is hemodynamically stable.   Normocytic anemia Drop in hemoglobin noted.  No evidence of overt bleeding.  Patient does have a history of multiple myeloma and is on chemotherapy for same. Previous lab values reviewed.  Hemoglobin was 9.7 in April 2024.  Hemoglobin low but stable.  No bleeding noted.  Okay for discharge today.   Chronic systolic CHF Recent echocardiogram on June 01, 2022 showed LVEF to be 45 to 50%.  Global hypokinesis noted.  Grade 1 diastolic dysfunction noted.  No significant valvular disease was noted. Patient recently hospitalized for CHF and was discharged on furosemide spironolactone and [place].  These medications currently on hold primarily because of elevated creatinine. ARB to be discontinued.  Patient to take furosemide only as needed for fluid overload.   Hyperlipidemia LDL is 68.    Essential hypertension Noted to be on metoprolol which is being continued.  Losartan is on hold due to elevated creatinine.   Acute kidney injury Does not appear to have history of CKD.  Did have mildly elevated creatinine when she was discharged from the hospital in April after being managed for CHF.  She was placed on diuretics as discussed above.   Renal function has improved at discharge.   History of multiple myeloma Follows with medical oncology Dr. Ellin Saba. Currently on a 7 day hold for lenalidomide per Dr. Ellin Saba, was to resume on Mon 5/13  Mild hyperglycemia Likely due to steroids.  HbA1c 6.1.   Low TSH TSH 0.21.  Free T4 1.22.  Could be suggestive of sick euthyroid considering recent hospitalization.  No overt symptoms of hyperthyroidism.   Recommend that these levels be rechecked in 3 to 4 weeks.   Patient is stable.  Okay for discharge home with family.   PERTINENT LABS:  The results of significant diagnostics from this hospitalization (including imaging, microbiology, ancillary and laboratory) are listed below for reference.    Labs:   Basic Metabolic Panel: Recent Labs  Lab 07/02/22 1248 07/03/22 0230 07/03/22 1018 07/04/22 0425 07/05/22 0555  NA 142 141 141 139 139  K 3.5 3.1* 3.4* 3.4* 3.5  CL 106 107 107 108 108  CO2 23 20* 20* 22 24  GLUCOSE 131* 131* 144* 103* 97  BUN 35* 32* 30* 26* 22  CREATININE 1.63* 1.64* 1.54* 1.34* 1.18*  CALCIUM 9.2 8.5* 8.8* 8.4* 8.3*  MG  --  1.9  --  1.8  --   PHOS  --  4.2  --   --   --    Liver Function Tests: Recent Labs  Lab 07/03/22 0230  ALBUMIN 2.7*    CBC: Recent Labs  Lab 07/02/22 1248 07/03/22 0230 07/03/22 1018 07/04/22 0425 07/05/22 0555  WBC 5.3 7.4 9.3 7.5 5.6  HGB 12.6 11.3* 11.3* 9.8* 9.1*  HCT 39.1 34.6* 34.5* 30.8* 28.7*  MCV 85.0 83.4 83.3 85.1 83.9  PLT 330 333 342 359 452*    BNP: BNP (last 3 results) Recent Labs    05/31/22 1153  BNP 1,070.0*      IMAGING STUDIES VAS [place] LOWER EXTREMITY VENOUS (DVT)  Result Date: 07/05/2022  Lower Venous DVT Study Patient Name:  Leslie Williamson  Date of Exam:   07/04/2022 Medical Rec #: [id number]          Accession #:    [id number] Date of Birth: 07/11/1931          Patient Gender: F Patient Age:   89 years Exam Location:  [facility] Procedure:      VAS [place] LOWER EXTREMITY VENOUS (DVT) Referring Phys: Lyda Perone --------------------------------------------------------------------------------  Indications: Pulmonary embolism.  Risk Factors: Confirmed PE. Anticoagulation: Heparin. Limitations: Poor ultrasound/tissue interface. Comparison Study: No prior studies. Performing Technologist: Chanda Busing RVT  Examination Guidelines: A complete evaluation includes B-mode imaging, spectral  Doppler, color Doppler, and power Doppler as needed of all accessible portions of each vessel. Bilateral testing is considered an integral part of a complete examination. Limited examinations for reoccurring indications may be performed as noted. The reflux portion of the exam is performed with the patient in reverse Trendelenburg.  +---------+---------------+---------+-----------+----------+--------------+ RIGHT    CompressibilityPhasicitySpontaneityPropertiesThrombus Aging +---------+---------------+---------+-----------+----------+--------------+ CFV      Full           Yes      Yes                                 +---------+---------------+---------+-----------+----------+--------------+ SFJ      Full                                                        +---------+---------------+---------+-----------+----------+--------------+ FV Prox  Full                                                        +---------+---------------+---------+-----------+----------+--------------+  FV Mid   Full                                                        +---------+---------------+---------+-----------+----------+--------------+ FV DistalFull                                                        +---------+---------------+---------+-----------+----------+--------------+ PFV      Full                                                        +---------+---------------+---------+-----------+----------+--------------+ POP      Full           Yes      Yes                                 +---------+---------------+---------+-----------+----------+--------------+ PTV      Full                                                        +---------+---------------+---------+-----------+----------+--------------+ PERO     Full                                                        +---------+---------------+---------+-----------+----------+--------------+    +---------+---------------+---------+-----------+----------+--------------+ LEFT     CompressibilityPhasicitySpontaneityPropertiesThrombus Aging +---------+---------------+---------+-----------+----------+--------------+ CFV      Full           Yes      Yes                                 +---------+---------------+---------+-----------+----------+--------------+ SFJ      Full                                                        +---------+---------------+---------+-----------+----------+--------------+ FV Prox  Full                                                        +---------+---------------+---------+-----------+----------+--------------+ FV Mid   Full                                                        +---------+---------------+---------+-----------+----------+--------------+   FV DistalFull                                                        +---------+---------------+---------+-----------+----------+--------------+ PFV      Full                                                        +---------+---------------+---------+-----------+----------+--------------+ POP      Full           Yes      Yes                                 +---------+---------------+---------+-----------+----------+--------------+ PTV      Full                                                        +---------+---------------+---------+-----------+----------+--------------+ PERO     Full                                                        +---------+---------------+---------+-----------+----------+--------------+     Summary: RIGHT: - There is no evidence of deep vein thrombosis in the lower extremity.  - No cystic structure found in the popliteal fossa.  LEFT: - There is no evidence of deep vein thrombosis in the lower extremity.  - No cystic structure found in the popliteal fossa.  *See table(s) above for measurements and observations. Electronically signed  by Sherald Hess MD on 07/05/2022 at 1:39:53 PM.    Final    CT Angio Chest Pulmonary Embolism (PE) W or WO Contrast  Addendum Date: 07/03/2022   ADDENDUM REPORT: 07/03/2022 19:23 ADDENDUM: Critical Value/emergent results were called by telephone at the time of interpretation on 07/03/2022 at 7:23 pm to provider Marshell Levan, MD, who verbally acknowledged these results. Electronically Signed   By: Genevive Bi M.D.   On: 07/03/2022 19:23   Result Date: 07/03/2022 CLINICAL DATA:  Concern for acute pulmonary embolism. EXAM: CT ANGIOGRAPHY CHEST WITH CONTRAST TECHNIQUE: Multidetector CT imaging of the chest was performed using the standard protocol during bolus administration of intravenous contrast. Multiplanar CT image reconstructions and MIPs were obtained to evaluate the vascular anatomy. RADIATION DOSE REDUCTION: This exam was performed according to the departmental dose-optimization program which includes automated exposure control, adjustment of the mA and/or kV according to patient size and/or use of iterative reconstruction technique. CONTRAST:  75mL OMNIPAQUE IOHEXOL 350 MG/ML SOLN COMPARISON:  PET-CT scan 11/05/2021, CT abdomen pelvis 05/31/2022 FINDINGS: Cardiovascular: Large tubular filling defects within the proximal pulmonary arteries to the LEFT upper lobe, LEFT lower lobe, RIGHT upper lobe and to lesser degree RIGHT lower lobe. These thrombi are occlusive. Overall clot burden is severe. There is evidence of RIGHT heart ventricular strain with the RV to LV ratio equal  1.3. Mediastinum/Nodes: Port in the anterior chest wall with tip in distal SVC. No axillary or supraclavicular adenopathy. No mediastinal or hilar adenopathy. No pericardial fluid. Esophagus normal. Lungs/Pleura: No pulmonary infarction. No pneumonia. No pleural fluid. No pneumothorax Upper Abdomen: Limited view of the liver, kidneys, pancreas are unremarkable. Normal adrenal glands. Musculoskeletal: Lucent lesions within the mid  thoracic spine not changed from comparison exam. No active myeloma PET-CT scan 11/05/2021 Review of the MIP images confirms the above findings. IMPRESSION: Bilateral severe occlusive pulmonary emboli with RIGHT heart strain. Positive for acute PE with CT evidence of right heart strain (RV/LV Ratio = 1.3) consistent with at least submassive (intermediate risk) PE. The presence of right heart strain has been associated with an increased risk of morbidity and mortality. Please refer to the "Code PE Focused" order set in EPIC. Electronically Signed: By: Genevive Bi M.D. On: 07/03/2022 19:11   ECHOCARDIOGRAM LIMITED  Result Date: 07/03/2022    ECHOCARDIOGRAM LIMITED REPORT   Patient Name:   Leslie Williamson Date of Exam: 07/03/2022 Medical Rec #:  [id number]         Height:       60.0 in Accession #:    [id number]        Weight:       126.0 lb Date of Birth:  1931/10/09         BSA:          1.534 m Patient Age:    89 years          BP:           116/57 mmHg Patient Gender: F                 HR:           87 bpm. Exam Location:  Inpatient Procedure: Limited Echo, Color Doppler and Cardiac Doppler REPORT CONTAINS CRITICAL RESULT Indications:    NSTEMI  History:        Patient has no prior history of Echocardiogram examinations and                 Patient has prior history of Echocardiogram examinations, most                 recent 06/01/2022. NSTEMI; Risk Factors:Dyslipidemia. Multiple                 myeloma.  Sonographer:    Milbert Coulter Referring Phys: [id number] Cleora Fleet  Sonographer Comments: Suboptimal apical window. Displacement of apical windows due to cardiac chamber changes. IMPRESSIONS  1. Compared to echo from April 2024, RV is dilated and function reduced.  2. Left ventricular ejection fraction, by estimation, is 50%. The left ventricle has mildly decreased function. There is mild left ventricular hypertrophy.  3. Right ventricular systolic function is moderately reduced. The right ventricular size  is moderately enlarged.  4. Right atrial size was mildly dilated.  5. Trivial mitral valve regurgitation.  6. Tricuspid valve regurgitation is moderate to severe.  7. The aortic valve is tricuspid. Aortic valve regurgitation is mild.  8. The inferior vena cava is dilated in size with <50% respiratory variability, suggesting right atrial pressure of 15 mmHg. FINDINGS  Left Ventricle: Left ventricular ejection fraction, by estimation, is 50%. The left ventricle has mildly decreased function. The left ventricular internal cavity size was normal in size. There is mild left ventricular hypertrophy. Right Ventricle: The right ventricular size is moderately enlarged. Right vetricular wall thickness was  not assessed. Right ventricular systolic function is moderately reduced. Left Atrium: Left atrial size was normal in size. Right Atrium: Right atrial size was mildly dilated. Pericardium: Trivial pericardial effusion is present. Mitral Valve: There is mild thickening of the mitral valve leaflet(s). Trivial mitral valve regurgitation. Tricuspid Valve: The tricuspid valve is normal in structure. Tricuspid valve regurgitation is moderate to severe. Aortic Valve: The aortic valve is tricuspid. Aortic valve regurgitation is mild. Pulmonic Valve: The pulmonic valve was not well visualized. Aorta: The aortic root is normal in size and structure. Venous: The inferior vena cava is dilated in size with less than 50% respiratory variability, suggesting right atrial pressure of 15 mmHg. Additional Comments: Spectral Doppler performed. Color Doppler performed.  LEFT VENTRICLE PLAX 2D LVIDd:         3.20 cm LVIDs:         2.10 cm LV PW:         1.20 cm LV IVS:        1.20 cm  RIGHT VENTRICLE RV Basal diam:  3.50 cm RV Mid diam:    3.00 cm LEFT ATRIUM           Index        RIGHT ATRIUM           Index LA diam:      2.60 cm 1.70 cm/m   RA Area:     19.50 cm LA Vol (A4C): 31.7 ml 20.67 ml/m  RA Volume:   56.80 ml  37.03 ml/m   AORTA Ao  Root diam: 3.20 cm TRICUSPID VALVE TR Peak grad:   35.8 mmHg TR Vmax:        299.00 cm/s Dietrich Pates MD Electronically signed by Dietrich Pates MD Signature Date/Time: 07/03/2022/3:09:50 PM    Final    DG CHEST PORT 1 VIEW  Result Date: 07/03/2022 CLINICAL DATA:  Atelectasis. EXAM: PORTABLE CHEST 1 VIEW COMPARISON:  07/02/2022 FINDINGS: The lungs are clear without focal pneumonia, edema, pneumothorax or pleural effusion. Cardiopericardial silhouette is at upper limits of normal for size. Right Port-A-Cath again noted. Telemetry leads overlie the chest. IMPRESSION: No active disease. Electronically Signed   By: Kennith Center M.D.   On: 07/03/2022 08:49   DG Chest 2 View  Result Date: 07/02/2022 CLINICAL DATA:  Chest pain and shortness of breath EXAM: CHEST - 2 VIEW COMPARISON:  Chest radiograph dated 05/31/2022 FINDINGS: Lines/tubes: Right chest wall port tip projects over the superior cavoatrial junction. Chest: Well inflated lungs. Focal opacity along the posterior lung base. Rounded radiodensity projecting over the right mid lung likely corresponds to a monitoring patch. Pleura: No pneumothorax or pleural effusion. Heart/mediastinum: The heart size and mediastinal contours are within normal limits. Bones: No acute osseous abnormality. Surgical clips project over the right neck and upper abdomen. IMPRESSION: Focal opacity along the posterior lung base may represent atelectasis, aspiration, or pneumonia. Electronically Signed   By: Agustin Cree M.D.   On: 07/02/2022 11:35    DISCHARGE EXAMINATION: Vitals:   07/04/22 1037 07/04/22 2121 07/05/22 0440 07/05/22 0811  BP: 123/76 102/83 117/63 120/69  Pulse: 89 88 88 80  Resp:  18 18 17   Temp:  98 F (36.7 C) 98.3 F (36.8 C)   TempSrc:  Oral Oral   SpO2:  94% 92% 98%  Weight:   56.4 kg   Height:       General appearance: Awake alert.  In no distress Resp: Clear to auscultation bilaterally.  Normal effort Cardio:  S1-S2 is normal regular.  No S3-S4.  No  rubs murmurs or bruit GI: Abdomen is soft.  Nontender nondistended.  Bowel sounds are present normal.  No masses organomegaly   DISPOSITION: Home  Discharge Instructions     Call MD for:  difficulty breathing, headache or visual disturbances   Complete by: As directed    Call MD for:  extreme fatigue   Complete by: As directed    Call MD for:  persistant dizziness or light-headedness   Complete by: As directed    Call MD for:  persistant nausea and vomiting   Complete by: As directed    Call MD for:  severe uncontrolled pain   Complete by: As directed    Call MD for:  temperature >100.4   Complete by: As directed    Diet - low sodium heart healthy   Complete by: As directed    Discharge instructions   Complete by: As directed    Please take your medications as prescribed.  Follow-up with your primary care provider within 1 week.  Follow-up with your medical oncologist as well.  Monitor for signs of bleeding including blood in the stool, black-colored stool, blood in the urine or bleeding from any other site.  If you notice any bleeding please stop taking the blood thinners and contact your doctor immediately.  You were cared for by a hospitalist during your hospital stay. If you have any questions about your discharge medications or the care you received while you were in the hospital after you are discharged, you can call the unit and asked to speak with the hospitalist on call if the hospitalist that took care of you is not available. Once you are discharged, your primary care physician will handle any further medical issues. Please note that NO REFILLS for any discharge medications will be authorized once you are discharged, as it is imperative that you return to your primary care physician (or establish a relationship with a primary care physician if you do not have one) for your aftercare needs so that they can reassess your need for medications and monitor your lab values. If you do  not have a primary care physician, you can call [phone] for a physician referral.   Increase activity slowly   Complete by: As directed           Allergies as of 07/05/2022       Reactions   Penicillins Rash        Medication List     STOP taking these medications    aspirin 81 MG tablet   ketorolac 10 MG tablet Commonly known as: TORADOL   losartan 25 MG tablet Commonly known as: COZAAR       TAKE these medications    acetaminophen 500 MG tablet Commonly known as: TYLENOL Take 1,000 mg by mouth every 6 (six) hours as needed for moderate pain.   acyclovir 400 MG tablet Commonly known as: ZOVIRAX Take 1 tablet (400 mg total) by mouth 2 (two) times daily.   dapagliflozin propanediol 10 MG Tabs tablet Commonly known as: FARXIGA Take 1 tablet (10 mg total) by mouth daily.   dexamethasone 4 MG tablet Commonly known as: DECADRON Take 20 mg (5 tablets) by mouth weekly every Wednesday morning   Eliquis DVT/PE Starter Pack Generic drug: Apixaban Starter Pack (10mg  and 5mg ) Take as directed on package: start with two-5mg  tablets twice daily for 7 days. On day 8, switch to one-5mg  tablet twice daily.  Eliquis 5 MG Tabs tablet Generic drug: apixaban Take 1 tablet (5 mg total) by mouth 2 (two) times daily. Start taking on: August 05, 2022   furosemide 40 MG tablet Commonly known as: LASIX Take 40mg  as needed if you gain more than 3 pounds in 1 day or 5 pounds in 1 week What changed:  how much to take how to take this when to take this additional instructions   lenalidomide 15 MG capsule Commonly known as: REVLIMID Take 1 capsule (15 mg total) by mouth daily. Take for 21 days on, 7 days off.   metoprolol succinate 25 MG 24 hr tablet Commonly known as: TOPROL-XL Take 0.5 tablets (12.5 mg total) by mouth daily.   potassium chloride SA 20 MEQ tablet Commonly known as: Klor-Con M20 Take 1 tablet on the days you take lasix What changed: See the new  instructions.   spironolactone 25 MG tablet Commonly known as: ALDACTONE Take 0.5 tablets (12.5 mg total) by mouth daily.          Follow-up Information     Pricilla Riffle, MD Follow up.   Specialty: Cardiology Why: [facility]umberto Seals - cardiology office will call you for your follow-up appointment. When they call you with your appointment information, just be sure to clarify which location you'll be seen at. Contact information: [address] [place] [address] [phone]         Benetta Spar, MD. Schedule an appointment as soon as possible for a visit in 1 week(s).   Specialty: Internal Medicine Why: post hospitalization follow up Contact information: [address] [place] [address] [phone]         [facility] Follow up.   Specialty: Home Health Services Why: Home Health physical theray and occupational therapy- office to call with visit times. Contact information: [address] [place] [address] [phone]                 TOTAL DISCHARGE TIME: 35 minutes  Aasir Daigler Rito Ehrlich  [facility] Pager on [email or web address]  07/06/2022, 10:55 AM

## 2022-07-05 NOTE — Progress Notes (Addendum)
Rounding Note    Patient Name: Leslie Williamson Date of Encounter: 07/05/2022  [facility] Cardiologist: Dina Rich, MD   Subjective   Patient resting comfortably on the edge of her bed. She denies chest pain, shortness of breath, dizziness. She is excited about potential discharge home.  Inpatient Medications    Scheduled Meds:  acyclovir  400 mg Oral BID   apixaban  10 mg Oral BID   Followed by   Melene Muller ON 07/11/2022] apixaban  5 mg Oral BID   aspirin  81 mg Oral Daily   atorvastatin  20 mg Oral Daily   Chlorhexidine Gluconate Cloth  6 each Topical Daily   metoprolol succinate  12.5 mg Oral Daily   pantoprazole  40 mg Oral QPM   Continuous Infusions:  PRN Meds: acetaminophen **OR** acetaminophen, bisacodyl, HYDROcodone-acetaminophen, HYDROmorphone (DILAUDID) injection, ondansetron **OR** ondansetron (ZOFRAN) IV, traZODone   Vital Signs    Vitals:   07/04/22 0355 07/04/22 1037 07/04/22 2121 07/05/22 0440  BP: 119/64 123/76 102/83 117/63  Pulse: 88 89 88 88  Resp: 18  18 18   Temp: 98.4 F (36.9 C)  98 F (36.7 C) 98.3 F (36.8 C)  TempSrc: Oral  Oral Oral  SpO2: 93%  94% 92%  Weight: 57.6 kg   56.4 kg  Height:        Intake/Output Summary (Last 24 hours) at 07/05/2022 0717 Last data filed at 07/04/2022 2020 Gross per 24 hour  Intake 358.28 ml  Output --  Net 358.28 ml      07/05/2022    4:40 AM 07/04/2022    3:55 AM 07/02/2022   11:07 AM  Last 3 Weights  Weight (lbs) 124 lb 4.8 oz 127 lb 126 lb  Weight (kg) 56.382 kg 57.607 kg 57.153 kg      Telemetry    Sinus rhythm - Personally Reviewed  ECG    No new tracing - Personally Reviewed  Physical Exam   GEN: No acute distress.   Neck: No JVD Cardiac: RRR, no murmurs, rubs, or gallops.  Respiratory: Clear to auscultation bilaterally. GI: Soft, nontender, non-distended  MS: trace lower extremity edema bilaterally around ankles. Neuro:  Nonfocal  Psych: Normal affect   Labs     High Sensitivity Troponin:   Recent Labs  Lab 07/02/22 1248 07/02/22 1442 07/02/22 1712 07/02/22 2012  TROPONINIHS 1,072* 1,030* 1,040* 1,086*     Chemistry Recent Labs  Lab 07/03/22 1018 07/04/22 0425 07/05/22 0555  NA 141 139 139  K 3.4* 3.4* 3.5  CL 107 108 108  CO2 20* 22 24  GLUCOSE 144* 103* 97  BUN 30* 26* 22  CREATININE 1.54* 1.34* 1.18*  CALCIUM 8.8* 8.4* 8.3*  MG  --  1.8  --   GFRNONAA 32* 38* 44*  ANIONGAP 14 9 7     Lipids  Recent Labs  Lab 07/04/22 0425  CHOL 119  TRIG 78  HDL 35*  LDLCALC 68  CHOLHDL 3.4    Hematology Recent Labs  Lab 07/03/22 1018 07/04/22 0425 07/05/22 0555  WBC 9.3 7.5 5.6  RBC 4.14 3.62* 3.42*  HGB 11.3* 9.8* 9.1*  HCT 34.5* 30.8* 28.7*  MCV 83.3 85.1 83.9  MCH 27.3 27.1 26.6  MCHC 32.8 31.8 31.7  RDW 19.9* 19.6* 19.8*  PLT 342 359 452*   Thyroid  Recent Labs  Lab 07/03/22 1018  TSH 0.210*  FREET4 1.22*    BNPNo results for input(s): "BNP", "PROBNP" in the last 168 hours.  DDimer No results for input(s): "DDIMER" in the last 168 hours.   Radiology    VAS [place] LOWER EXTREMITY VENOUS (DVT)  Result Date: 07/04/2022  Lower Venous DVT Study Patient Name:  Leslie Williamson  Date of Exam:   07/04/2022 Medical Rec #: [id number]          Accession #:    [id number] Date of Birth: [birth year]          Patient Gender: F Patient Age:   89 years Exam Location:  [facility] Procedure:      VAS [place] LOWER EXTREMITY VENOUS (DVT) Referring Phys: Lyda Perone --------------------------------------------------------------------------------  Indications: Pulmonary embolism.  Risk Factors: Confirmed PE. Anticoagulation: Heparin. Limitations: Poor ultrasound/tissue interface. Comparison Study: No prior studies. Performing Technologist: Chanda Busing RVT  Examination Guidelines: A complete evaluation includes B-mode imaging, spectral Doppler, color Doppler, and power Doppler as needed of all accessible portions of each vessel.  Bilateral testing is considered an integral part of a complete examination. Limited examinations for reoccurring indications may be performed as noted. The reflux portion of the exam is performed with the patient in reverse Trendelenburg.  +---------+---------------+---------+-----------+----------+--------------+ RIGHT    CompressibilityPhasicitySpontaneityPropertiesThrombus Aging +---------+---------------+---------+-----------+----------+--------------+ CFV      Full           Yes      Yes                                 +---------+---------------+---------+-----------+----------+--------------+ SFJ      Full                                                        +---------+---------------+---------+-----------+----------+--------------+ FV Prox  Full                                                        +---------+---------------+---------+-----------+----------+--------------+ FV Mid   Full                                                        +---------+---------------+---------+-----------+----------+--------------+ FV DistalFull                                                        +---------+---------------+---------+-----------+----------+--------------+ PFV      Full                                                        +---------+---------------+---------+-----------+----------+--------------+ POP      Full           Yes      Yes                                 +---------+---------------+---------+-----------+----------+--------------+  PTV      Full                                                        +---------+---------------+---------+-----------+----------+--------------+ PERO     Full                                                        +---------+---------------+---------+-----------+----------+--------------+   +---------+---------------+---------+-----------+----------+--------------+ LEFT      CompressibilityPhasicitySpontaneityPropertiesThrombus Aging +---------+---------------+---------+-----------+----------+--------------+ CFV      Full           Yes      Yes                                 +---------+---------------+---------+-----------+----------+--------------+ SFJ      Full                                                        +---------+---------------+---------+-----------+----------+--------------+ FV Prox  Full                                                        +---------+---------------+---------+-----------+----------+--------------+ FV Mid   Full                                                        +---------+---------------+---------+-----------+----------+--------------+ FV DistalFull                                                        +---------+---------------+---------+-----------+----------+--------------+ PFV      Full                                                        +---------+---------------+---------+-----------+----------+--------------+ POP      Full           Yes      Yes                                 +---------+---------------+---------+-----------+----------+--------------+ PTV      Full                                                        +---------+---------------+---------+-----------+----------+--------------+  PERO     Full                                                        +---------+---------------+---------+-----------+----------+--------------+    Summary: RIGHT: - There is no evidence of deep vein thrombosis in the lower extremity.  - No cystic structure found in the popliteal fossa.  LEFT: - There is no evidence of deep vein thrombosis in the lower extremity.  - No cystic structure found in the popliteal fossa.  *See table(s) above for measurements and observations.    Preliminary    CT Angio Chest Pulmonary Embolism (PE) W or WO Contrast  Addendum Date: 07/03/2022    ADDENDUM REPORT: 07/03/2022 19:23 ADDENDUM: Critical Value/emergent results were called by telephone at the time of interpretation on 07/03/2022 at 7:23 pm to provider Marshell Levan, MD, who verbally acknowledged these results. Electronically Signed   By: Genevive Bi M.D.   On: 07/03/2022 19:23   Result Date: 07/03/2022 CLINICAL DATA:  Concern for acute pulmonary embolism. EXAM: CT ANGIOGRAPHY CHEST WITH CONTRAST TECHNIQUE: Multidetector CT imaging of the chest was performed using the standard protocol during bolus administration of intravenous contrast. Multiplanar CT image reconstructions and MIPs were obtained to evaluate the vascular anatomy. RADIATION DOSE REDUCTION: This exam was performed according to the departmental dose-optimization program which includes automated exposure control, adjustment of the mA and/or kV according to patient size and/or use of iterative reconstruction technique. CONTRAST:  75mL OMNIPAQUE IOHEXOL 350 MG/ML SOLN COMPARISON:  PET-CT scan 11/05/2021, CT abdomen pelvis 05/31/2022 FINDINGS: Cardiovascular: Large tubular filling defects within the proximal pulmonary arteries to the LEFT upper lobe, LEFT lower lobe, RIGHT upper lobe and to lesser degree RIGHT lower lobe. These thrombi are occlusive. Overall clot burden is severe. There is evidence of RIGHT heart ventricular strain with the RV to LV ratio equal 1.3. Mediastinum/Nodes: Port in the anterior chest wall with tip in distal SVC. No axillary or supraclavicular adenopathy. No mediastinal or hilar adenopathy. No pericardial fluid. Esophagus normal. Lungs/Pleura: No pulmonary infarction. No pneumonia. No pleural fluid. No pneumothorax Upper Abdomen: Limited view of the liver, kidneys, pancreas are unremarkable. Normal adrenal glands. Musculoskeletal: Lucent lesions within the mid thoracic spine not changed from comparison exam. No active myeloma PET-CT scan 11/05/2021 Review of the MIP images confirms the above findings.  IMPRESSION: Bilateral severe occlusive pulmonary emboli with RIGHT heart strain. Positive for acute PE with CT evidence of right heart strain (RV/LV Ratio = 1.3) consistent with at least submassive (intermediate risk) PE. The presence of right heart strain has been associated with an increased risk of morbidity and mortality. Please refer to the "Code PE Focused" order set in EPIC. Electronically Signed: By: Genevive Bi M.D. On: 07/03/2022 19:11   ECHOCARDIOGRAM LIMITED  Result Date: 07/03/2022    ECHOCARDIOGRAM LIMITED REPORT   Patient Name:   Leslie Williamson Date of Exam: 07/03/2022 Medical Rec #:  [id number]         Height:       60.0 in Accession #:    [id number]        Weight:       126.0 lb Date of Birth:  1931-04-19         BSA:          1.534 m Patient Age:  89 years          BP:           116/57 mmHg Patient Gender: F                 HR:           87 bpm. Exam Location:  Inpatient Procedure: Limited Echo, Color Doppler and Cardiac Doppler REPORT CONTAINS CRITICAL RESULT Indications:    NSTEMI  History:        Patient has no prior history of Echocardiogram examinations and                 Patient has prior history of Echocardiogram examinations, most                 recent 06/01/2022. NSTEMI; Risk Factors:Dyslipidemia. Multiple                 myeloma.  Sonographer:    Milbert Coulter Referring Phys: [id number] Cleora Fleet  Sonographer Comments: Suboptimal apical window. Displacement of apical windows due to cardiac chamber changes. IMPRESSIONS  1. Compared to echo from April 2024, RV is dilated and function reduced.  2. Left ventricular ejection fraction, by estimation, is 50%. The left ventricle has mildly decreased function. There is mild left ventricular hypertrophy.  3. Right ventricular systolic function is moderately reduced. The right ventricular size is moderately enlarged.  4. Right atrial size was mildly dilated.  5. Trivial mitral valve regurgitation.  6. Tricuspid valve regurgitation is  moderate to severe.  7. The aortic valve is tricuspid. Aortic valve regurgitation is mild.  8. The inferior vena cava is dilated in size with <50% respiratory variability, suggesting right atrial pressure of 15 mmHg. FINDINGS  Left Ventricle: Left ventricular ejection fraction, by estimation, is 50%. The left ventricle has mildly decreased function. The left ventricular internal cavity size was normal in size. There is mild left ventricular hypertrophy. Right Ventricle: The right ventricular size is moderately enlarged. Right vetricular wall thickness was not assessed. Right ventricular systolic function is moderately reduced. Left Atrium: Left atrial size was normal in size. Right Atrium: Right atrial size was mildly dilated. Pericardium: Trivial pericardial effusion is present. Mitral Valve: There is mild thickening of the mitral valve leaflet(s). Trivial mitral valve regurgitation. Tricuspid Valve: The tricuspid valve is normal in structure. Tricuspid valve regurgitation is moderate to severe. Aortic Valve: The aortic valve is tricuspid. Aortic valve regurgitation is mild. Pulmonic Valve: The pulmonic valve was not well visualized. Aorta: The aortic root is normal in size and structure. Venous: The inferior vena cava is dilated in size with less than 50% respiratory variability, suggesting right atrial pressure of 15 mmHg. Additional Comments: Spectral Doppler performed. Color Doppler performed.  LEFT VENTRICLE PLAX 2D LVIDd:         3.20 cm LVIDs:         2.10 cm LV PW:         1.20 cm LV IVS:        1.20 cm  RIGHT VENTRICLE RV Basal diam:  3.50 cm RV Mid diam:    3.00 cm LEFT ATRIUM           Index        RIGHT ATRIUM           Index LA diam:      2.60 cm 1.70 cm/m   RA Area:     19.50 cm LA Vol (A4C): 31.7 ml 20.67 ml/m  RA  Volume:   56.80 ml  37.03 ml/m   AORTA Ao Root diam: 3.20 cm TRICUSPID VALVE TR Peak grad:   35.8 mmHg TR Vmax:        299.00 cm/s Dietrich Pates MD Electronically signed by Dietrich Pates MD  Signature Date/Time: 07/03/2022/3:09:50 PM    Final    DG CHEST PORT 1 VIEW  Result Date: 07/03/2022 CLINICAL DATA:  Atelectasis. EXAM: PORTABLE CHEST 1 VIEW COMPARISON:  07/02/2022 FINDINGS: The lungs are clear without focal pneumonia, edema, pneumothorax or pleural effusion. Cardiopericardial silhouette is at upper limits of normal for size. Right Port-A-Cath again noted. Telemetry leads overlie the chest. IMPRESSION: No active disease. Electronically Signed   By: Kennith Center M.D.   On: 07/03/2022 08:49    Cardiac Studies   07/03/22 TTE  IMPRESSIONS     1. Compared to echo from April 2024, RV is dilated and function reduced.   2. Left ventricular ejection fraction, by estimation, is 50%. The left  ventricle has mildly decreased function. There is mild left ventricular  hypertrophy.   3. Right ventricular systolic function is moderately reduced. The right  ventricular size is moderately enlarged.   4. Right atrial size was mildly dilated.   5. Trivial mitral valve regurgitation.   6. Tricuspid valve regurgitation is moderate to severe.   7. The aortic valve is tricuspid. Aortic valve regurgitation is mild.   8. The inferior vena cava is dilated in size with <50% respiratory  variability, suggesting right atrial pressure of 15 mmHg.   FINDINGS   Left Ventricle: Left ventricular ejection fraction, by estimation, is  50%. The left ventricle has mildly decreased function. The left  ventricular internal cavity size was normal in size. There is mild left  ventricular hypertrophy.   Right Ventricle: The right ventricular size is moderately enlarged. Right  vetricular wall thickness was not assessed. Right ventricular systolic  function is moderately reduced.   Left Atrium: Left atrial size was normal in size.   Right Atrium: Right atrial size was mildly dilated.   Pericardium: Trivial pericardial effusion is present.   Mitral Valve: There is mild thickening of the mitral valve  leaflet(s).  Trivial mitral valve regurgitation.   Tricuspid Valve: The tricuspid valve is normal in structure. Tricuspid  valve regurgitation is moderate to severe.   Aortic Valve: The aortic valve is tricuspid. Aortic valve regurgitation is  mild.   Pulmonic Valve: The pulmonic valve was not well visualized.   Aorta: The aortic root is normal in size and structure.   Venous: The inferior vena cava is dilated in size with less than 50%  respiratory variability, suggesting right atrial pressure of 15 mmHg.   Additional Comments: Spectral Doppler performed. Color Doppler performed.   Patient Profile     89 y.o. female with multiple myeloma currently on chemotherapy (followed by Dr. 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 Patient presented to the [facility] ED with left side chest pain, dyspnea, and weakness. Patient found with elevated troponin and was transferred to [facility] for further management. On 5/11, was found to have bilateral severe occlusive pulmonary emboli with right heart strain on CTA chest.  Assessment & Plan    Bilateral occlusive pulmonary emboli  TTE on 5/11 showed evidence of right heart strain prompting CTA chest. This found bilateral severe occlusive pulmonary emboli.  Despite PE, patient maintaining stable O2 saturation on room air and does not have evidence of dyspnea on physical exam. PCCM has seen patient, given risks/benefits of fibrinolytic therapy and mechanical thrombectomy and overall stable appearance, plan for anticoagulation only. Patient transitioned to Eliquis from heparin yesterday. Plan for lifelong anti-coagulation. Will plan for follow up echocardiogram in 2-3 months.  Elevated troponin  Patient with  elevated troponin on admission: 1072, 1030, 1040, 1086. Initially placed on heparin with suspicion of ACS, continued on this therapy following PE diagnosis. Given age and advanced aortic atherosclerosis on CT, likely that patient has some degree of CAD. However, current clinical picture is more consistent with demand ischemia 2/2 high burden PE.   Chronic HFrEF  TTE this admission showed LVEF 50%. Clinically, patient appears euvolemic. Prior to admission, was on GDMT of Farxiga 10mg , Lasix 40mg  daily, Losartan 12.5mg  daily, Metoprolol Succinate 12.5mg  daily, Spironolactone 12.5mg  daily. Currently only receiving metoprolol this admission.   Would plan to resume Spironolactone and Farxiga at d/c Given EF 50%, would consider holding losartan. Also suspect that patient might tolerate SGLT2 with PRN lasix.   Hypertension  BP stable this admission. See above.  Hyperlipidemia  Continue Atorvastatin 20mg .  [facility] will sign off.   Medication Recommendations: Eliquis 10 mg BID x 7 days then 5 mg BID.  Continue Farxiga 10 mg daily, spironolactone 12.5 mg daily, Toprol-XL 12.5 mg daily.  Hold losartan and Lasix for now.  Would monitor daily weights and can take Lasix as needed if gains more than 3 pounds in 1 day or 5 pounds in 1 week Other recommendations (labs, testing, etc): None Follow up as an outpatient: Will schedule   For questions or updates, please contact  [facility] Please consult [email or web address] for contact info under      Signed, Perlie Gold, PA-C  07/05/2022, 7:17 AM    Patient seen and examined.  Agree with above documentation.  On exam, patient is alert and oriented, regular rate and rhythm, no murmurs, lungs CTAB, trace LE edema, mild JVD.  Will restart home spironolactone and Farxiga.  Hold losartan for now.  Would also plan to switch Lasix to as needed on discharge.  Would monitor daily weights and take if gains more than 3 pounds in 1 day or 5 pounds in  1 week.  Will schedule outpatient cardiology follow-up.  Little Ishikawa, MD

## 2022-07-05 NOTE — Evaluation (Signed)
Occupational Therapy Evaluation Patient Details Name: Leslie Williamson MRN: [id number] DOB: March 07, 1931 Today's Date: 07/05/2022   History of Present Illness Pt is a 89 year old female admitted on 07/02/22 with Chest Pain. Past medical history of multiple myeloma currently on chemotherapy followed by Dr. 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   Clinical Impression   Pt reports independence at baseline with ADLs and functional mobility, lives with family who can assist at d/c. Pt currently needing mod I - supervision for ADLs, and supervision for transfers without AD. Pt presenting with impairments listed below, will follow acutely. Recommend HHOT at d/c pending progression.      Recommendations for follow up therapy are one component of a multi-disciplinary discharge planning process, led by the attending physician.  Recommendations may be updated based on patient status, additional functional criteria and insurance authorization.   Assistance Recommended at Discharge Set up Supervision/Assistance  Patient can return home with the following A little help with walking and/or transfers;A little help with bathing/dressing/bathroom;Assistance with cooking/housework;Direct supervision/assist for medications management;Direct supervision/assist for financial management;Assist for transportation;Help with stairs or ramp for entrance    Functional Status Assessment  Patient has had a recent decline in their functional status and demonstrates the ability to make significant improvements in function in a reasonable and predictable amount of time.   Equipment Recommendations  None recommended by OT    Recommendations for Other Services PT consult     Precautions / Restrictions Precautions Precautions: Fall Restrictions Weight Bearing Restrictions: No      Mobility Bed Mobility               General bed mobility comments: OOB in chair upon arrival    Transfers Overall transfer level: Needs assistance Equipment used: None Transfers: Sit to/from Stand Sit to Stand: Supervision                  Balance Overall balance assessment: No apparent balance deficits (not formally assessed)                                         ADL either performed or assessed with clinical judgement   ADL Overall ADL's : Needs assistance/impaired Eating/Feeding: Modified independent   Grooming: Modified independent   Upper Body Bathing: Supervision/ safety   Lower Body Bathing: Supervison/ safety   Upper Body Dressing : Supervision/safety   Lower Body Dressing: Supervision/safety   Toilet Transfer: Supervision/safety   Toileting- Clothing Manipulation and Hygiene: Supervision/safety       Functional mobility during ADLs: Supervision/safety       Vision   Vision Assessment?: No apparent visual deficits     Perception Perception Perception Tested?: No   Praxis Praxis Praxis tested?: Not tested    Pertinent Vitals/Pain Pain Assessment Pain Assessment: No/denies pain     Hand Dominance Right   Extremity/Trunk Assessment Upper Extremity Assessment Upper Extremity Assessment: Overall WFL for tasks assessed   Lower Extremity Assessment Lower Extremity Assessment: Defer to PT evaluation   Cervical / Trunk Assessment Cervical / Trunk Assessment: Normal   Communication Communication Communication: No difficulties   Cognition Arousal/Alertness: Awake/alert  Behavior During Therapy: WFL for tasks assessed/performed Overall Cognitive Status: Within Functional Limits for tasks  assessed                                       General Comments  VSS on RA    Exercises     Shoulder Instructions      Home Living Family/patient expects to be discharged to:: Private residence Living Arrangements: Children;Other relatives Available Help at Discharge: Family;Available 24 hours/day Type of Home: House Home Access: Stairs to enter [employer] of Steps: 3 Entrance Stairs-Rails: Right;Left;Can reach both Home Layout: One level     Bathroom Shower/Tub: Producer, television/film/video: Standard Bathroom Accessibility: Yes   Home Equipment: Cane - single point;Shower seat;Grab bars - Chartered loss adjuster (2 wheels);Hand held shower head          Prior Functioning/Environment Prior Level of Function : Independent/Modified Independent;Driving             Mobility Comments: short distance community ambulator without AD ADLs Comments: Independent        OT Problem List: Decreased strength;Decreased range of motion;Decreased activity tolerance;Impaired balance (sitting and/or standing)      OT Treatment/Interventions: Therapeutic exercise;Self-care/ADL training;Energy conservation;Therapeutic activities;Balance training;Patient/family education    OT Goals(Current goals can be found in the care plan section) Acute Rehab OT Goals Patient Stated Goal: none stated OT Goal Formulation: With patient Time For Goal Achievement: 07/19/22 Potential to Achieve Goals: Good ADL Goals Pt Will Perform Upper Body Dressing: Independently;sitting Pt Will Perform Lower Body Dressing: Independently;sitting/lateral leans;sit to/from stand Pt Will Perform Tub/Shower Transfer: Tub transfer;Shower transfer;Independently;ambulating Additional ADL Goal #1: pt will verbalize x3 energy conservation strategies in prep for ADLs  OT Frequency: Min 1X/week    Co-evaluation              AM-PAC OT "6 Clicks" Daily Activity     Outcome  Measure Help from another person eating meals?: None Help from another person taking care of personal grooming?: None Help from another person toileting, which includes using toliet, bedpan, or urinal?: A Little Help from another person bathing (including washing, rinsing, drying)?: A Little Help from another person to put on and taking off regular upper body clothing?: A Little Help from another person to put on and taking off regular lower body clothing?: A Little 6 Click Score: 20   End of Session Nurse Communication: Mobility status  Activity Tolerance: Patient tolerated treatment well Patient left: in chair;with call bell/phone within reach;with family/visitor present  OT Visit Diagnosis: Unsteadiness on feet (R26.81);Other abnormalities of gait and mobility (R26.89);Muscle weakness (generalized) (M62.81)                Time: 6578-4696 OT Time Calculation (min): 19 min Charges:  OT General Charges $OT Visit: 1 Visit OT Evaluation $OT Eval Low Complexity: 1 Low  Leslie Williamson, OTD, OTR/L SecureChat Preferred Acute Rehab [phone]   Leslie Williamson 07/05/2022, 12:44 PM

## 2022-07-05 NOTE — Progress Notes (Signed)
Discharge instructions reviewed with pt, her daughter and her son.  Copy of instructions given to pt. Eliquis starter pack filled by [facility] and delivered to pt at bedside. Pt and family verbalize understanding of instructions and able to teach back.  IV team in and deaccessed pt's PAC. Pt ready for discharge.  Pt to be d/c'd via wheelchair with belongings, with family.            To be escorted by staff/volunteer.

## 2022-07-08 DIAGNOSIS — C9000 Multiple myeloma not having achieved remission: Secondary | ICD-10-CM | POA: Diagnosis not present

## 2022-07-08 DIAGNOSIS — Z9181 History of falling: Secondary | ICD-10-CM | POA: Diagnosis not present

## 2022-07-08 DIAGNOSIS — D471 Chronic myeloproliferative disease: Secondary | ICD-10-CM | POA: Diagnosis not present

## 2022-07-08 DIAGNOSIS — G8929 Other chronic pain: Secondary | ICD-10-CM | POA: Diagnosis not present

## 2022-07-08 DIAGNOSIS — E876 Hypokalemia: Secondary | ICD-10-CM | POA: Diagnosis not present

## 2022-07-08 DIAGNOSIS — D75839 Thrombocytosis, unspecified: Secondary | ICD-10-CM | POA: Diagnosis not present

## 2022-07-08 DIAGNOSIS — M519 Unspecified thoracic, thoracolumbar and lumbosacral intervertebral disc disorder: Secondary | ICD-10-CM | POA: Diagnosis not present

## 2022-07-08 DIAGNOSIS — Z7984 Long term (current) use of oral hypoglycemic drugs: Secondary | ICD-10-CM | POA: Diagnosis not present

## 2022-07-08 DIAGNOSIS — E785 Hyperlipidemia, unspecified: Secondary | ICD-10-CM | POA: Diagnosis not present

## 2022-07-08 DIAGNOSIS — I5022 Chronic systolic (congestive) heart failure: Secondary | ICD-10-CM | POA: Diagnosis not present

## 2022-07-08 DIAGNOSIS — E278 Other specified disorders of adrenal gland: Secondary | ICD-10-CM | POA: Diagnosis not present

## 2022-07-08 DIAGNOSIS — Z79899 Other long term (current) drug therapy: Secondary | ICD-10-CM | POA: Diagnosis not present

## 2022-07-08 DIAGNOSIS — Z7982 Long term (current) use of aspirin: Secondary | ICD-10-CM | POA: Diagnosis not present

## 2022-07-08 DIAGNOSIS — I110 Hypertensive heart disease with heart failure: Secondary | ICD-10-CM | POA: Diagnosis not present

## 2022-07-12 DIAGNOSIS — I5023 Acute on chronic systolic (congestive) heart failure: Secondary | ICD-10-CM | POA: Diagnosis not present

## 2022-07-12 DIAGNOSIS — R946 Abnormal results of thyroid function studies: Secondary | ICD-10-CM | POA: Diagnosis not present

## 2022-07-12 DIAGNOSIS — Z86711 Personal history of pulmonary embolism: Secondary | ICD-10-CM | POA: Diagnosis not present

## 2022-07-12 DIAGNOSIS — N179 Acute kidney failure, unspecified: Secondary | ICD-10-CM | POA: Diagnosis not present

## 2022-07-13 ENCOUNTER — Encounter: Payer: Self-pay | Admitting: Internal Medicine

## 2022-07-13 ENCOUNTER — Other Ambulatory Visit: Payer: Self-pay

## 2022-07-13 ENCOUNTER — Ambulatory Visit: Payer: Medicare Other | Attending: Physician Assistant | Admitting: Internal Medicine

## 2022-07-13 VITALS — BP 100/54 | HR 84 | Ht 60.0 in | Wt 128.2 lb

## 2022-07-13 DIAGNOSIS — I5020 Unspecified systolic (congestive) heart failure: Secondary | ICD-10-CM

## 2022-07-13 NOTE — Progress Notes (Addendum)
Cardiology Office Note   Date:  07/13/2022   ID:  Tynae, Dreessen [birth year], MRN [id number]  PCP:  Benetta Spar, MD  Cardiologist:   Dietrich Pates, MD   Pt presents for follow up after hosp for pulmonary embolus   History of Present Illness: Leslie Williamson is a 89 y.o. female with a history of HTN, HL, HFmrEF She was seen in consult in April when se was admitted with LE edema   Echo on 06/01/22 showed LVEF 45 to 50%  RV function normal.  Severe LAE   Mild MR   New compared to echo in 2017 when LVEF normal    She was diuresed and d/c'd on lasix, spironolactone, toprol XL, losartan and farxiga The pt was admitted on 07/02/22 with L sided CP, SOB and weaknes  Started about 3 days prior to admit   Intermitt but worsening  Carediology consulted for troponin of 1072   Heparin started    Echo done    Showed new RV dysfunction    With this pt set up for CTA of chest which showed  severe bilateral occlusive PE   LE dopplers were negative for DVT   The pt was discharged on spironolactone, farxiga.  She also has lasix to take as needed for weight gain  The pt says she is feeling a little stronger, Denies signficant SOB   No dizziness.  No chest pressure   No palpitations       Current Meds  Medication Sig   acetaminophen (TYLENOL) 500 MG tablet Take 1,000 mg by mouth every 6 (six) hours as needed for moderate pain.   acyclovir (ZOVIRAX) 400 MG tablet Take 1 tablet (400 mg total) by mouth 2 (two) times daily.   [START ON 08/05/2022] apixaban (ELIQUIS) 5 MG TABS tablet Take 1 tablet (5 mg total) by mouth 2 (two) times daily.   dapagliflozin propanediol (FARXIGA) 10 MG TABS tablet Take 1 tablet (10 mg total) by mouth daily.   dexamethasone (DECADRON) 4 MG tablet Take 20 mg (5 tablets) by mouth weekly every Wednesday morning   furosemide (LASIX) 40 MG tablet Take 40mg  as needed if you gain more than 3 pounds in 1 day or 5 pounds in 1 week   lenalidomide (REVLIMID) 15 MG capsule  Take 1 capsule (15 mg total) by mouth daily. Take for 21 days on, 7 days off.   metoprolol succinate (TOPROL-XL) 25 MG 24 hr tablet Take 0.5 tablets (12.5 mg total) by mouth daily.   potassium chloride SA (KLOR-CON M20) 20 MEQ tablet Take 1 tablet on the days you take lasix   spironolactone (ALDACTONE) 25 MG tablet Take 0.5 tablets (12.5 mg total) by mouth daily.     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   Vertigo, labyrinthine     Past Surgical History:  Procedure Laterality Date   CATARACT EXTRACTION W/PHACO Left 11/03/2015   Procedure: CATARACT EXTRACTION PHACO AND INTRAOCULAR LENS PLACEMENT (IOC);  Surgeon: Gemma Payor, MD;  Location: [facility];  Service: Ophthalmology;  Laterality: Left;  CDE: 7.74   CATARACT EXTRACTION W/PHACO Right 11/20/2015   Procedure: CATARACT EXTRACTION PHACO AND INTRAOCULAR LENS PLACEMENT ; CDE:  8.30;  Surgeon: Gemma Payor, MD;  Location: [facility];  Service: Ophthalmology;  Laterality: Right;   IR IMAGING GUIDED PORT INSERTION  11/04/2021   PORT-A-CATH REMOVAL       Social History:  The patient  reports that she has quit smoking. She has never used smokeless tobacco. She reports that she does not drink alcohol and does not use drugs.   Family History:  The patient's family history includes Diabetes in her mother.    ROS:  Please see the  history of present illness. All other systems are reviewed and  Negative to the above problem except as noted.    PHYSICAL EXAM: VS:  BP (!) 100/54   Pulse 84   Ht 5' (1.524 m)   Wt 128 lb 3.2 oz (58.2 kg)   SpO2 97%   BMI 25.04 kg/m   GEN: Well nourished, well developed, in no acute distress  HEENT: normal  Neck: no JVD, carotid bruits Cardiac: RRR; no murmurs  Tr LE edema  Respiratory:  clear to auscultation bilaterally GI: soft, nontender, nondistended, + BS  No hepatomegaly     EKG:  EKG is ordered today. Echo 07/02/22 1. Compared to echo from April 2024, RV is dilated and function reduced.   2. Left ventricular ejection fraction, by estimation, is 50%. The left  ventricle has mildly decreased function. There is mild left ventricular  hypertrophy.   3. Right ventricular systolic function is moderately reduced. The right  ventricular size is moderately enlarged.   4. Right atrial size was mildly dilated.   5. Trivial mitral valve regurgitation.   6. Tricuspid valve regurgitation is moderate to severe.   7. The aortic valve is tricuspid. Aortic valve regurgitation is mild.   8. The inferior vena cava is dilated in size with <50% respiratory  variability, suggesting right atrial pressure of 15 mmHg.    Echocardiogram 06/01/2022:  1. Left ventricular ejection fraction, by estimation, is 45 to 50%. The  left ventricle has mildly decreased function. The left ventricle  demonstrates global hypokinesis. There is mild left ventricular  hypertrophy. Left ventricular diastolic parameters  are consistent with Grade I diastolic dysfunction (impaired relaxation).  Elevated left atrial pressure.   2. Right ventricular systolic function is normal. The right ventricular  size is normal. There is normal pulmonary artery systolic pressure.   3. Left atrial size was severely dilated.   4. The mitral valve is abnormal. Mild mitral valve regurgitation. No  evidence of mitral stenosis.   5.  The tricuspid valve is abnormal.   6. The aortic valve is tricuspid. Aortic valve regurgitation is mild. No  aortic stenosis is present.   7. The inferior vena cava is normal in size with greater than 50%  respiratory variability, suggesting right atrial pressure of 3 mmHg.     Lipid Panel    Component Value Date/Time   CHOL 119 07/04/2022 0425   TRIG 78 07/04/2022 0425   HDL 35 (L) 07/04/2022 0425   CHOLHDL 3.4 07/04/2022 0425   VLDL 16 07/04/2022 0425  LDLCALC 68 07/04/2022 0425      Wt Readings from Last 3 Encounters:  07/13/22 128 lb 3.2 oz (58.2 kg)  07/05/22 124 lb 4.8 oz (56.4 kg)  06/16/22 128 lb 3.2 oz (58.2 kg)      ASSESSMENT AND PLAN:  1  PE with RV strain (new)  PE severe      Pt was initially dizzy when in hospital  No syncope   Improved since then    Plan for indefinite anticoagulation give multiple myeloma Plan for follow up echo late summer or fall  2 Trop elevation   Pt with elevated (flat ) troponin  1072, 1030, 1040, 1086   Most likely due to strain on RV from PE   Follow    3 HFmrEF   Echo with very mildly decreased LVEF   Keep on current meds  SBP is 100/  I would not push further given this and needs preload with PE hx    may need to back off some if dizzy  4.  Lipids   LDL 68  HDL 35   Trig 78   Follow     Current medicines are reviewed at length with the patient today.  The patient does not have concerns regarding medicines.  Signed, Dietrich Pates, MD  07/13/2022 2:39 PM    [facility] [address] [place], [place], [place]  [address] Phone: [phone]; Fax: [phone]

## 2022-07-13 NOTE — Progress Notes (Signed)
[facility] [address] [place] [address]    Clinic Day:  07/14/2022  Referring physician: Benetta Spar*  Patient Care Team: Benetta Spar, MD as PCP - General (Internal Medicine) Wyline Mood Dorothe Pea, MD as PCP - Cardiology (Cardiology) Doreatha Massed, MD as Medical Oncologist (Medical Oncology)   ASSESSMENT & PLAN:   Assessment: 1.  Biclonal IgG lambda and IgM kappa multiple myeloma, high risk: - BMBX (11/02/2021): IgG lambda plasma cells 70% - Myeloma FISH panel: Monosomy 13 (standard GIST), duplication of 1 q. (high risk), gain of 11 q. (standard risk). - Cytogenetics: Hyperdiploid E weight gain (trisomy/trisomy) of odd-numbered chromosomes and monosomy 13, standard risk.  Gain of 1 q. associated with progressive course. - PET scan (11/05/2021): No evidence of multiple myeloma.  No hypermetabolic adenopathy. - Daratumumab, Revlimid and dexamethasone cycle 1 started on 11/30/2021   2.  JAK2 positive essential thrombocytosis: - BMBX (11/02/2021): No evidence of fibrosis.  No morphological evidence of JAK2 positive ET.   3.  History of marginal zone lymphoma of the spleen: - Diagnosed in 1999, status post CHOP x7 cycles followed by rituximab 1 cycle. - Status post splenectomy.  No evidence of lymphoma on current PET scan.   4.  Social/family history: - She lives with her sister.  Son lives 30 minutes away.  She is independent of ADLs and some IADLs.  She drives.  She worked at [employer] and denies any exposure to chemicals or pesticides.  No family history of malignancies.  5.  Bone protection (DEXA scan 02/17/2022 T-score -0.9): - She saw dentist who recommended dental extractions.  She does not want zoledronic acid or denosumab.    Plan: 1.  IgG lambda multiple myeloma: - She was admitted to the hospital from 07/02/2022 through 07/05/2022 with acute pulmonary embolism.  I have reviewed hospitalization records. - She  is feeling better at this time. - Reviewed myeloma panel from 06/16/2022: M spike 0.2 g.  Free light chain ratio is 2.35 with normal lambda light chains 21.7. - Recommend continuing Revlimid 15 mg 3 weeks on/1 week off and dexamethasone 20 mg weekly. - Continue Darzalex monthly. - RTC 8 weeks for follow-up with repeat myeloma panel.   2.  Anemia from CKD and iron deficiency: - Hemoglobin today is 9.5.  Will check ferritin and iron panel at next visit to see if she needs any parenteral iron therapy.   3.  ID prophylaxis: - Continue acyclovir twice daily and aspirin 81 mg daily.   4.  Pulmonary embolism (Dx 07/03/2022):: - Continue Eliquis twice daily indefinitely.   5.  Hypokalemia: - Continue potassium 40 mEq daily.   6.  Left lower back pain radiating to the left posterior thigh: - Continue Toradol as needed.    Orders Placed This Encounter  Procedures   CBC with Differential    Standing Status:   Future    Standing Expiration Date:   11/04/2023   CBC with Differential    Standing Status:   Future    Standing Expiration Date:   12/02/2023      I,[name],acting as a scribe for Doreatha Massed, MD.,have documented all relevant documentation on the behalf of Doreatha Massed, MD,as directed by  Doreatha Massed, MD while in the presence of Doreatha Massed, MD.   I, Doreatha Massed MD, have reviewed the above documentation for accuracy and completeness, and I agree with the above.   Doreatha Massed, MD   5/22/20244:46 PM  CHIEF COMPLAINT:   Diagnosis: multiple myeloma    Cancer Staging  Multiple myeloma (HCC) Staging form: Plasma Cell Myeloma and Plasma Cell Disorders, AJCC 8th Edition - Clinical stage from 11/16/2021: Albumin (g/dL): 3.1, ISS: Stage II, High-risk cytogenetics: Absent, LDH: Normal - Unsigned    Prior Therapy: none  Current Therapy:  Daratumumab, Revlimid and dexamethasone    HISTORY OF PRESENT ILLNESS:   Oncology  History  NHL (non-Hodgkin's lymphoma) (HCC)  07/19/1995 Pathology Results   Spleen biopsy with resection- low-grade B-cell lymphoma, splenic marginal zone type   02/20/1998 Imaging   CT CAP-marked and extensive cervical adenopathy   02/25/1998 Pathology Results   Right cervical lymph node demonstrating large B-cell lymphoma   03/05/1998 Bone Marrow Biopsy   No evidence of bone marrow involvement   03/11/1998 - 07/18/1998 Chemotherapy   CHOP x 7 cycles   05/09/1998 Imaging   CT CAP and neck- slight interval decrease in size of para-aortic adenopathy with interval decrease in right inguinal and bi-iliac adenopathy.  Interval decrease in size and number of enlarged cervical nodes   07/10/1998 Imaging   Ct CAP and neck- unremarkable CT of chest. No enlarged retroperitoneal adenopathy withthe previously noted retroperitoneal nodes currently smaller to stable in size.  Cervical adenopathy is stable to slightly smaller in size.   08/18/1998 - 09/08/1998 Chemotherapy   Rituxan Day 1, 8, 15, 22 x 1 cycle   01/08/1999 Remission   CT CAP and neck demonstrates no adenopathy or disease   01/08/1999 Imaging   CT CAP and neck- Stable CT of chest. Stable CT abd/pelvis without adenopathy.  Negative CT of neck   Multiple myeloma (HCC)  11/16/2021 Initial Diagnosis   Multiple myeloma (HCC)   11/30/2021 -  Chemotherapy   Patient is on Treatment Plan : MYELOMA  Daratumumab SQ + Lenalidomide + Dexamethasone (DaraRd) q28d        INTERVAL HISTORY:   Leslie Williamson is a 89 y.o. female presenting to clinic today for follow up of multiple myeloma. She was last seen by me on 05/19/22.  Since her last visit, she was admitted on 05/31/22 for lower extremity edema.  She was also admitted on 07/02/22 for NSTEMI.  Today, she states that she is doing well overall. Her appetite level is at 70%. Her energy level is at 60%.  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)    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   Vertigo, labyrinthine     Surgical History: Past Surgical History:  Procedure Laterality Date   CATARACT EXTRACTION W/PHACO Left 11/03/2015   Procedure: CATARACT EXTRACTION PHACO AND INTRAOCULAR LENS PLACEMENT (IOC);  Surgeon: Gemma Payor, MD;  Location: [facility];  Service: Ophthalmology;  Laterality: Left;  CDE: 7.74   CATARACT EXTRACTION W/PHACO Right 11/20/2015   Procedure: CATARACT EXTRACTION PHACO  AND INTRAOCULAR LENS PLACEMENT ; CDE:  8.30;  Surgeon: Gemma Payor, MD;  Location: [facility];  Service: Ophthalmology;  Laterality: Right;   IR IMAGING GUIDED PORT INSERTION  11/04/2021   PORT-A-CATH REMOVAL      Social History: Social History   Socioeconomic History   Marital status: Widowed    Spouse name: Not on file   Number of children: Not on file   Years of education: Not on file   Highest education level: Not on file  Occupational History   Not on file  Tobacco Use   Smoking status: Former   Smokeless tobacco: Never  Vaping Use   Vaping Use: Never used  Substance and Sexual Activity   Alcohol use: No   Drug use: No   Sexual activity: Not Currently  Other Topics Concern   Not on file  Social History Narrative   Not on file   Social Determinants of Health   Financial Resource Strain: Low Risk  (12/31/2019)   Overall Financial Resource Strain (CARDIA)    Difficulty of Paying Living Expenses:  Not hard at all  Food Insecurity: No Food Insecurity (07/03/2022)   Hunger Vital Sign    Worried About Running Out of Food in the Last Year: Never true    Ran Out of Food in the Last Year: Never true  Transportation Needs: No Transportation Needs (07/03/2022)   PRAPARE - Administrator, Civil Service (Medical): No    Lack of Transportation (Non-Medical): No  Physical Activity: Inactive (12/31/2019)   Exercise Vital Sign    Days of Exercise per Week: 0 days    Minutes of Exercise per Session: 0 min  Stress: No Stress Concern Present (12/31/2019)   [employer] of Occupational Health - Occupational Stress Questionnaire    Feeling of Stress : Not at all  Social Connections: Moderately Isolated (12/31/2019)   Social Connection and Isolation Panel [NHANES]    Frequency of Communication with Friends and Family: More than three times a week    Frequency of Social Gatherings with Friends and Family: More than three times a week    Attends Religious Services: More than 4 times per year    Active Member of [employer] or Organizations: No    Attends Banker Meetings: Never    Marital Status: Widowed  Intimate Partner Violence: Not At Risk (07/03/2022)   Humiliation, Afraid, Rape, and Kick questionnaire    Fear of Current or Ex-Partner: No    Emotionally Abused: No    Physically Abused: No    Sexually Abused: No    Family History: Family History  Problem Relation Age of Onset   Diabetes Mother     Current Medications:  Current Outpatient Medications:    acetaminophen (TYLENOL) 500 MG tablet, Take 1,000 mg by mouth every 6 (six) hours as needed for moderate pain., Disp: , Rfl:    acyclovir (ZOVIRAX) 400 MG tablet, Take 1 tablet (400 mg total) by mouth 2 (two) times daily., Disp: 60 tablet, Rfl: 6   [START ON 08/05/2022] apixaban (ELIQUIS) 5 MG TABS tablet, Take 1 tablet (5 mg total) by mouth 2 (two) times daily., Disp: 60 tablet, Rfl: 1   APIXABAN (ELIQUIS) VTE STARTER  PACK (10MG  AND 5MG ), Take as directed on package: start with two-5mg  tablets twice daily for 7 days. On day 8, switch to one-5mg  tablet twice daily., Disp: 74 each, Rfl: 0   dapagliflozin propanediol (FARXIGA) 10 MG TABS tablet, Take 1 tablet (10  mg total) by mouth daily., Disp: 30 tablet, Rfl: 1   dexamethasone (DECADRON) 4 MG tablet, Take 20 mg (5 tablets) by mouth weekly every Wednesday morning, Disp: 20 tablet, Rfl: 6   furosemide (LASIX) 40 MG tablet, Take 40mg  as needed if you gain more than 3 pounds in 1 day or 5 pounds in 1 week, Disp: 30 tablet, Rfl: 1   lenalidomide (REVLIMID) 15 MG capsule, Take 1 capsule (15 mg total) by mouth daily. Take for 21 days on, 7 days off., Disp: 21 capsule, Rfl: 0   metoprolol succinate (TOPROL-XL) 25 MG 24 hr tablet, Take 0.5 tablets (12.5 mg total) by mouth daily., Disp: 30 tablet, Rfl: 0   potassium chloride SA (KLOR-CON M20) 20 MEQ tablet, Take 1 tablet on the days you take lasix, Disp: 180 tablet, Rfl: 3   spironolactone (ALDACTONE) 25 MG tablet, Take 0.5 tablets (12.5 mg total) by mouth daily., Disp: 30 tablet, Rfl: 0   Allergies: Allergies  Allergen Reactions   Penicillins Rash    REVIEW OF SYSTEMS:   Review of Systems  Constitutional:  Negative for chills, fatigue and fever.  HENT:   Negative for lump/mass, mouth sores, nosebleeds, sore throat and trouble swallowing.   Eyes:  Negative for eye problems.  Respiratory:  Positive for cough and shortness of breath.   Cardiovascular:  Negative for chest pain, leg swelling and palpitations.  Gastrointestinal:  Negative for abdominal pain, constipation, diarrhea, nausea and vomiting.  Genitourinary:  Negative for bladder incontinence, difficulty urinating, dysuria, frequency, hematuria and nocturia.   Musculoskeletal:  Negative for arthralgias, back pain, flank pain, myalgias and neck pain.  Skin:  Negative for itching and rash.  Neurological:  Positive for numbness. Negative for dizziness and  headaches.  Hematological:  Does not bruise/bleed easily.  Psychiatric/Behavioral:  Negative for depression, sleep disturbance and suicidal ideas. The patient is not nervous/anxious.   All other systems reviewed and are negative.    VITALS:   There were no vitals taken for this visit.  Wt Readings from Last 3 Encounters:  07/14/22 125 lb 3.2 oz (56.8 kg)  07/13/22 128 lb 3.2 oz (58.2 kg)  07/05/22 124 lb 4.8 oz (56.4 kg)    There is no height or weight on file to calculate BMI.  Performance status (ECOG): 1 - Symptomatic but completely ambulatory  PHYSICAL EXAM:   Physical Exam Vitals and nursing note reviewed. Exam conducted with a chaperone present.  Constitutional:      Appearance: Normal appearance.  Cardiovascular:     Rate and Rhythm: Normal rate and regular rhythm.     Pulses: Normal pulses.     Heart sounds: Normal heart sounds.  Pulmonary:     Effort: Pulmonary effort is normal.     Breath sounds: Normal breath sounds.  Abdominal:     Palpations: Abdomen is soft. There is no hepatomegaly, splenomegaly or mass.     Tenderness: There is no abdominal tenderness.  Musculoskeletal:     Right lower leg: No edema.     Left lower leg: No edema.  Lymphadenopathy:     Cervical: No cervical adenopathy.     Right cervical: No superficial, deep or posterior cervical adenopathy.    Left cervical: No superficial, deep or posterior cervical adenopathy.     Upper Body:     Right upper body: No supraclavicular or axillary adenopathy.     Left upper body: No supraclavicular or axillary adenopathy.  Neurological:     General: No focal  deficit present.     Mental Status: She is alert and oriented to person, place, and time.  Psychiatric:        Mood and Affect: Mood normal.        Behavior: Behavior normal.     LABS:      Latest Ref Rng & Units 07/14/2022   12:13 PM 07/05/2022    5:55 AM 07/04/2022    4:25 AM  CBC  WBC 4.0 - 10.5 K/uL 7.9  5.6  7.5   Hemoglobin 12.0 -  15.0 g/dL 9.5  9.1  9.8   Hematocrit 36.0 - 46.0 % 29.1  28.7  30.8   Platelets 150 - 400 K/uL 559  452  359       Latest Ref Rng & Units 07/14/2022   12:13 PM 07/05/2022    5:55 AM 07/04/2022    4:25 AM  CMP  Glucose 70 - 99 mg/dL 865  97  784   BUN 8 - 23 mg/dL 21  22  26    Creatinine 0.44 - 1.00 mg/dL 6.96  2.95  2.84   Sodium 135 - 145 mmol/L 137  139  139   Potassium 3.5 - 5.1 mmol/L 3.3  3.5  3.4   Chloride 98 - 111 mmol/L 105  108  108   CO2 22 - 32 mmol/L 23  24  22    Calcium 8.9 - 10.3 mg/dL 8.6  8.3  8.4   Total Protein 6.5 - 8.1 g/dL 5.6     Total Bilirubin 0.3 - 1.2 mg/dL 0.6     Alkaline Phos 38 - 126 U/L 51     AST 15 - 41 U/L 13     ALT 0 - 44 U/L 10        No results found for: "CEA1", "CEA" / No results found for: "CEA1", "CEA" No results found for: "PSA1" No results found for: "XLK440" No results found for: "CAN125"  Lab Results  Component Value Date   TOTALPROTELP 5.2 (L) 06/16/2022   ALBUMINELP 3.0 06/16/2022   A1GS 0.3 06/16/2022   A2GS 0.6 06/16/2022   BETS 0.9 06/16/2022   GAMS 0.5 06/16/2022   MSPIKE 0.2 (H) 06/16/2022   SPEI Comment 06/16/2022   Lab Results  Component Value Date   TIBC 249 (L) 03/10/2022   TIBC 291 09/30/2021   TIBC 268 07/22/2021   FERRITIN 93 03/10/2022   FERRITIN 45 09/30/2021   FERRITIN 126 08/07/2021   IRONPCTSAT 8 (L) 03/10/2022   IRONPCTSAT 19 09/30/2021   IRONPCTSAT 27 07/22/2021   Lab Results  Component Value Date   LDH 154 09/30/2021   LDH 148 08/05/2021   LDH 122 07/22/2021     STUDIES:   VAS [place] LOWER EXTREMITY VENOUS (DVT)  Result Date: 07/05/2022  Lower Venous DVT Study Patient Name:  Leslie Williamson  Date of Exam:   07/04/2022 Medical Rec #: [id number]          Accession #:    [id number] Date of Birth: 02/24/1931          Patient Gender: F Patient Age:   42 years Exam Location:  [facility] Procedure:      VAS [place] LOWER EXTREMITY VENOUS (DVT) Referring Phys: Lyda Perone  --------------------------------------------------------------------------------  Indications: Pulmonary embolism.  Risk Factors: Confirmed PE. Anticoagulation: Heparin. Limitations: Poor ultrasound/tissue interface. Comparison Study: No prior studies. Performing Technologist: Chanda Busing RVT  Examination Guidelines: A complete evaluation includes B-mode imaging, spectral Doppler, color Doppler,  and power Doppler as needed of all accessible portions of each vessel. Bilateral testing is considered an integral part of a complete examination. Limited examinations for reoccurring indications may be performed as noted. The reflux portion of the exam is performed with the patient in reverse Trendelenburg.  +---------+---------------+---------+-----------+----------+--------------+ RIGHT    CompressibilityPhasicitySpontaneityPropertiesThrombus Aging +---------+---------------+---------+-----------+----------+--------------+ CFV      Full           Yes      Yes                                 +---------+---------------+---------+-----------+----------+--------------+ SFJ      Full                                                        +---------+---------------+---------+-----------+----------+--------------+ FV Prox  Full                                                        +---------+---------------+---------+-----------+----------+--------------+ FV Mid   Full                                                        +---------+---------------+---------+-----------+----------+--------------+ FV DistalFull                                                        +---------+---------------+---------+-----------+----------+--------------+ PFV      Full                                                        +---------+---------------+---------+-----------+----------+--------------+ POP      Full           Yes      Yes                                  +---------+---------------+---------+-----------+----------+--------------+ PTV      Full                                                        +---------+---------------+---------+-----------+----------+--------------+ PERO     Full                                                        +---------+---------------+---------+-----------+----------+--------------+   +---------+---------------+---------+-----------+----------+--------------+  LEFT     CompressibilityPhasicitySpontaneityPropertiesThrombus Aging +---------+---------------+---------+-----------+----------+--------------+ CFV      Full           Yes      Yes                                 +---------+---------------+---------+-----------+----------+--------------+ SFJ      Full                                                        +---------+---------------+---------+-----------+----------+--------------+ FV Prox  Full                                                        +---------+---------------+---------+-----------+----------+--------------+ FV Mid   Full                                                        +---------+---------------+---------+-----------+----------+--------------+ FV DistalFull                                                        +---------+---------------+---------+-----------+----------+--------------+ PFV      Full                                                        +---------+---------------+---------+-----------+----------+--------------+ POP      Full           Yes      Yes                                 +---------+---------------+---------+-----------+----------+--------------+ PTV      Full                                                        +---------+---------------+---------+-----------+----------+--------------+ PERO     Full                                                         +---------+---------------+---------+-----------+----------+--------------+     Summary: RIGHT: - There is no evidence of deep vein thrombosis in the lower extremity.  - No cystic structure found in the popliteal fossa.  LEFT: - There is no evidence of deep vein thrombosis in the lower extremity.  - No  cystic structure found in the popliteal fossa.  *See table(s) above for measurements and observations. Electronically signed by Sherald Hess MD on 07/05/2022 at 1:39:53 PM.    Final    CT Angio Chest Pulmonary Embolism (PE) W or WO Contrast  Addendum Date: 07/03/2022   ADDENDUM REPORT: 07/03/2022 19:23 ADDENDUM: Critical Value/emergent results were called by telephone at the time of interpretation on 07/03/2022 at 7:23 pm to provider Marshell Levan, MD, who verbally acknowledged these results. Electronically Signed   By: Genevive Bi M.D.   On: 07/03/2022 19:23   Result Date: 07/03/2022 CLINICAL DATA:  Concern for acute pulmonary embolism. EXAM: CT ANGIOGRAPHY CHEST WITH CONTRAST TECHNIQUE: Multidetector CT imaging of the chest was performed using the standard protocol during bolus administration of intravenous contrast. Multiplanar CT image reconstructions and MIPs were obtained to evaluate the vascular anatomy. RADIATION DOSE REDUCTION: This exam was performed according to the departmental dose-optimization program which includes automated exposure control, adjustment of the mA and/or kV according to patient size and/or use of iterative reconstruction technique. CONTRAST:  75mL OMNIPAQUE IOHEXOL 350 MG/ML SOLN COMPARISON:  PET-CT scan 11/05/2021, CT abdomen pelvis 05/31/2022 FINDINGS: Cardiovascular: Large tubular filling defects within the proximal pulmonary arteries to the LEFT upper lobe, LEFT lower lobe, RIGHT upper lobe and to lesser degree RIGHT lower lobe. These thrombi are occlusive. Overall clot burden is severe. There is evidence of RIGHT heart ventricular strain with the RV to LV ratio equal 1.3.  Mediastinum/Nodes: Port in the anterior chest wall with tip in distal SVC. No axillary or supraclavicular adenopathy. No mediastinal or hilar adenopathy. No pericardial fluid. Esophagus normal. Lungs/Pleura: No pulmonary infarction. No pneumonia. No pleural fluid. No pneumothorax Upper Abdomen: Limited view of the liver, kidneys, pancreas are unremarkable. Normal adrenal glands. Musculoskeletal: Lucent lesions within the mid thoracic spine not changed from comparison exam. No active myeloma PET-CT scan 11/05/2021 Review of the MIP images confirms the above findings. IMPRESSION: Bilateral severe occlusive pulmonary emboli with RIGHT heart strain. Positive for acute PE with CT evidence of right heart strain (RV/LV Ratio = 1.3) consistent with at least submassive (intermediate risk) PE. The presence of right heart strain has been associated with an increased risk of morbidity and mortality. Please refer to the "Code PE Focused" order set in EPIC. Electronically Signed: By: Genevive Bi M.D. On: 07/03/2022 19:11   ECHOCARDIOGRAM LIMITED  Result Date: 07/03/2022    ECHOCARDIOGRAM LIMITED REPORT   Patient Name:   Leslie Williamson Date of Exam: 07/03/2022 Medical Rec #:  [id number]         Height:       60.0 in Accession #:    [id number]        Weight:       126.0 lb Date of Birth:  1931-03-07         BSA:          1.534 m Patient Age:    89 years          BP:           116/57 mmHg Patient Gender: F                 HR:           87 bpm. Exam Location:  Inpatient Procedure: Limited Echo, Color Doppler and Cardiac Doppler REPORT CONTAINS CRITICAL RESULT Indications:    NSTEMI  History:        Patient has no prior history of Echocardiogram examinations and  Patient has prior history of Echocardiogram examinations, most                 recent 06/01/2022. NSTEMI; Risk Factors:Dyslipidemia. Multiple                 myeloma.  Sonographer:    Milbert Coulter Referring Phys: [id number] Cleora Fleet  Sonographer  Comments: Suboptimal apical window. Displacement of apical windows due to cardiac chamber changes. IMPRESSIONS  1. Compared to echo from April 2024, RV is dilated and function reduced.  2. Left ventricular ejection fraction, by estimation, is 50%. The left ventricle has mildly decreased function. There is mild left ventricular hypertrophy.  3. Right ventricular systolic function is moderately reduced. The right ventricular size is moderately enlarged.  4. Right atrial size was mildly dilated.  5. Trivial mitral valve regurgitation.  6. Tricuspid valve regurgitation is moderate to severe.  7. The aortic valve is tricuspid. Aortic valve regurgitation is mild.  8. The inferior vena cava is dilated in size with <50% respiratory variability, suggesting right atrial pressure of 15 mmHg. FINDINGS  Left Ventricle: Left ventricular ejection fraction, by estimation, is 50%. The left ventricle has mildly decreased function. The left ventricular internal cavity size was normal in size. There is mild left ventricular hypertrophy. Right Ventricle: The right ventricular size is moderately enlarged. Right vetricular wall thickness was not assessed. Right ventricular systolic function is moderately reduced. Left Atrium: Left atrial size was normal in size. Right Atrium: Right atrial size was mildly dilated. Pericardium: Trivial pericardial effusion is present. Mitral Valve: There is mild thickening of the mitral valve leaflet(s). Trivial mitral valve regurgitation. Tricuspid Valve: The tricuspid valve is normal in structure. Tricuspid valve regurgitation is moderate to severe. Aortic Valve: The aortic valve is tricuspid. Aortic valve regurgitation is mild. Pulmonic Valve: The pulmonic valve was not well visualized. Aorta: The aortic root is normal in size and structure. Venous: The inferior vena cava is dilated in size with less than 50% respiratory variability, suggesting right atrial pressure of 15 mmHg. Additional Comments:  Spectral Doppler performed. Color Doppler performed.  LEFT VENTRICLE PLAX 2D LVIDd:         3.20 cm LVIDs:         2.10 cm LV PW:         1.20 cm LV IVS:        1.20 cm  RIGHT VENTRICLE RV Basal diam:  3.50 cm RV Mid diam:    3.00 cm LEFT ATRIUM           Index        RIGHT ATRIUM           Index LA diam:      2.60 cm 1.70 cm/m   RA Area:     19.50 cm LA Vol (A4C): 31.7 ml 20.67 ml/m  RA Volume:   56.80 ml  37.03 ml/m   AORTA Ao Root diam: 3.20 cm TRICUSPID VALVE TR Peak grad:   35.8 mmHg TR Vmax:        299.00 cm/s Dietrich Pates MD Electronically signed by Dietrich Pates MD Signature Date/Time: 07/03/2022/3:09:50 PM    Final    DG CHEST PORT 1 VIEW  Result Date: 07/03/2022 CLINICAL DATA:  Atelectasis. EXAM: PORTABLE CHEST 1 VIEW COMPARISON:  07/02/2022 FINDINGS: The lungs are clear without focal pneumonia, edema, pneumothorax or pleural effusion. Cardiopericardial silhouette is at upper limits of normal for size. Right Port-A-Cath again noted. Telemetry leads overlie the chest. IMPRESSION:  No active disease. Electronically Signed   By: Kennith Center M.D.   On: 07/03/2022 08:49   DG Chest 2 View  Result Date: 07/02/2022 CLINICAL DATA:  Chest pain and shortness of breath EXAM: CHEST - 2 VIEW COMPARISON:  Chest radiograph dated 05/31/2022 FINDINGS: Lines/tubes: Right chest wall port tip projects over the superior cavoatrial junction. Chest: Well inflated lungs. Focal opacity along the posterior lung base. Rounded radiodensity projecting over the right mid lung likely corresponds to a monitoring patch. Pleura: No pneumothorax or pleural effusion. Heart/mediastinum: The heart size and mediastinal contours are within normal limits. Bones: No acute osseous abnormality. Surgical clips project over the right neck and upper abdomen. IMPRESSION: Focal opacity along the posterior lung base may represent atelectasis, aspiration, or pneumonia. Electronically Signed   By: Agustin Cree M.D.   On: 07/02/2022 11:35

## 2022-07-13 NOTE — Patient Instructions (Signed)
Medication Instructions:  Your physician recommends that you continue on your current medications as directed. Please refer to the Current Medication list given to you today.  *If you need a refill on your cardiac medications before your next appointment, please call your pharmacy*   Lab Work: NONE   If you have labs (blood work) drawn today and your tests are completely normal, you will receive your results only by: MyChart Message (if you have MyChart) OR A paper copy in the mail If you have any lab test that is abnormal or we need to change your treatment, we will call you to review the results.   Testing/Procedures: NONE    Follow-Up: At [facility], you and your health needs are our priority.  As part of our continuing mission to provide you with exceptional heart care, we have created designated Provider Care Teams.  These Care Teams include your primary Cardiologist (physician) and Advanced Practice Providers (APPs -  Physician Assistants and Nurse Practitioners) who all work together to provide you with the care you need, when you need it.  We recommend signing up for the patient portal called "MyChart".  Sign up information is provided on this After Visit Summary.  MyChart is used to connect with patients for Virtual Visits (Telemedicine).  Patients are able to view lab/test results, encounter notes, upcoming appointments, etc.  Non-urgent messages can be sent to your provider as well.   To learn more about what you can do with MyChart, go to [email or web address].    Your next appointment:    September   Provider:   Dietrich Pates, MD    Other Instructions Thank you for choosing [facility]!

## 2022-07-14 ENCOUNTER — Inpatient Hospital Stay: Payer: Medicare Other | Admitting: Hematology

## 2022-07-14 ENCOUNTER — Inpatient Hospital Stay: Payer: Medicare Other

## 2022-07-14 ENCOUNTER — Inpatient Hospital Stay: Payer: Medicare Other | Attending: Hematology

## 2022-07-14 VITALS — BP 108/57 | HR 78 | Temp 97.7°F | Resp 18 | Wt 125.2 lb

## 2022-07-14 DIAGNOSIS — Z5112 Encounter for antineoplastic immunotherapy: Secondary | ICD-10-CM | POA: Insufficient documentation

## 2022-07-14 DIAGNOSIS — I129 Hypertensive chronic kidney disease with stage 1 through stage 4 chronic kidney disease, or unspecified chronic kidney disease: Secondary | ICD-10-CM | POA: Diagnosis not present

## 2022-07-14 DIAGNOSIS — Z8572 Personal history of non-Hodgkin lymphomas: Secondary | ICD-10-CM | POA: Insufficient documentation

## 2022-07-14 DIAGNOSIS — C9000 Multiple myeloma not having achieved remission: Secondary | ICD-10-CM

## 2022-07-14 DIAGNOSIS — Z95828 Presence of other vascular implants and grafts: Secondary | ICD-10-CM

## 2022-07-14 DIAGNOSIS — I10 Essential (primary) hypertension: Secondary | ICD-10-CM | POA: Diagnosis not present

## 2022-07-14 DIAGNOSIS — Z79899 Other long term (current) drug therapy: Secondary | ICD-10-CM | POA: Diagnosis not present

## 2022-07-14 DIAGNOSIS — M545 Low back pain, unspecified: Secondary | ICD-10-CM | POA: Diagnosis not present

## 2022-07-14 DIAGNOSIS — I700 Atherosclerosis of aorta: Secondary | ICD-10-CM | POA: Insufficient documentation

## 2022-07-14 DIAGNOSIS — D509 Iron deficiency anemia, unspecified: Secondary | ICD-10-CM | POA: Insufficient documentation

## 2022-07-14 DIAGNOSIS — Z86711 Personal history of pulmonary embolism: Secondary | ICD-10-CM | POA: Diagnosis not present

## 2022-07-14 DIAGNOSIS — N189 Chronic kidney disease, unspecified: Secondary | ICD-10-CM | POA: Diagnosis not present

## 2022-07-14 DIAGNOSIS — E876 Hypokalemia: Secondary | ICD-10-CM | POA: Insufficient documentation

## 2022-07-14 DIAGNOSIS — D631 Anemia in chronic kidney disease: Secondary | ICD-10-CM | POA: Diagnosis not present

## 2022-07-14 DIAGNOSIS — D473 Essential (hemorrhagic) thrombocythemia: Secondary | ICD-10-CM | POA: Diagnosis not present

## 2022-07-14 LAB — CBC WITH DIFFERENTIAL/PLATELET
Abs Immature Granulocytes: 0.03 10*3/uL (ref 0.00–0.07)
Basophils Absolute: 0.1 10*3/uL (ref 0.0–0.1)
Basophils Relative: 1 %
Eosinophils Absolute: 0 10*3/uL (ref 0.0–0.5)
Eosinophils Relative: 0 %
HCT: 29.1 % — ABNORMAL LOW (ref 36.0–46.0)
Hemoglobin: 9.5 g/dL — ABNORMAL LOW (ref 12.0–15.0)
Immature Granulocytes: 0 %
Lymphocytes Relative: 25 %
Lymphs Abs: 2 10*3/uL (ref 0.7–4.0)
MCH: 27.8 pg (ref 26.0–34.0)
MCHC: 32.6 g/dL (ref 30.0–36.0)
MCV: 85.1 fL (ref 80.0–100.0)
Monocytes Absolute: 0.5 10*3/uL (ref 0.1–1.0)
Monocytes Relative: 6 %
Neutro Abs: 5.3 10*3/uL (ref 1.7–7.7)
Neutrophils Relative %: 68 %
Platelets: 559 10*3/uL — ABNORMAL HIGH (ref 150–400)
RBC: 3.42 MIL/uL — ABNORMAL LOW (ref 3.87–5.11)
RDW: 20.9 % — ABNORMAL HIGH (ref 11.5–15.5)
WBC: 7.9 10*3/uL (ref 4.0–10.5)
nRBC: 0.4 % — ABNORMAL HIGH (ref 0.0–0.2)

## 2022-07-14 LAB — COMPREHENSIVE METABOLIC PANEL
ALT: 10 U/L (ref 0–44)
AST: 13 U/L — ABNORMAL LOW (ref 15–41)
Albumin: 3 g/dL — ABNORMAL LOW (ref 3.5–5.0)
Alkaline Phosphatase: 51 U/L (ref 38–126)
Anion gap: 9 (ref 5–15)
BUN: 21 mg/dL (ref 8–23)
CO2: 23 mmol/L (ref 22–32)
Calcium: 8.6 mg/dL — ABNORMAL LOW (ref 8.9–10.3)
Chloride: 105 mmol/L (ref 98–111)
Creatinine, Ser: 1.38 mg/dL — ABNORMAL HIGH (ref 0.44–1.00)
GFR, Estimated: 37 mL/min — ABNORMAL LOW (ref >60)
Glucose, Bld: 133 mg/dL — ABNORMAL HIGH (ref 70–99)
Potassium: 3.3 mmol/L — ABNORMAL LOW (ref 3.5–5.1)
Sodium: 137 mmol/L (ref 135–145)
Total Bilirubin: 0.6 mg/dL (ref 0.3–1.2)
Total Protein: 5.6 g/dL — ABNORMAL LOW (ref 6.5–8.1)

## 2022-07-14 LAB — MAGNESIUM: Magnesium: 1.8 mg/dL (ref 1.7–2.4)

## 2022-07-14 MED ORDER — CETIRIZINE HCL 10 MG PO TABS
10.0000 mg | ORAL_TABLET | Freq: Once | ORAL | Status: AC
  Administered 2022-07-14: 10 mg via ORAL
  Filled 2022-07-14: qty 1

## 2022-07-14 MED ORDER — HEPARIN SOD (PORK) LOCK FLUSH 100 UNIT/ML IV SOLN
500.0000 [IU] | Freq: Once | INTRAVENOUS | Status: AC
  Administered 2022-07-14: 500 [IU] via INTRAVENOUS

## 2022-07-14 MED ORDER — ACETAMINOPHEN 325 MG PO TABS
650.0000 mg | ORAL_TABLET | Freq: Once | ORAL | Status: AC
  Administered 2022-07-14: 650 mg via ORAL
  Filled 2022-07-14: qty 2

## 2022-07-14 MED ORDER — DARATUMUMAB-HYALURONIDASE-FIHJ 1800-30000 MG-UT/15ML ~~LOC~~ SOLN
1800.0000 mg | Freq: Once | SUBCUTANEOUS | Status: AC
  Administered 2022-07-14: 1800 mg via SUBCUTANEOUS
  Filled 2022-07-14: qty 15

## 2022-07-14 MED ORDER — SODIUM CHLORIDE 0.9% FLUSH
10.0000 mL | Freq: Once | INTRAVENOUS | Status: AC
  Administered 2022-07-14: 10 mL via INTRAVENOUS

## 2022-07-14 NOTE — Progress Notes (Signed)
Patient reports taking PO steroids at home prior to arrival.

## 2022-07-14 NOTE — Patient Instructions (Signed)
[facility]  Discharge Instructions: Thank you for choosing [facility] to provide your oncology and hematology care.  If you have a lab appointment with the Cancer Center - please note that after April 8th, 2024, all labs will be drawn in the cancer center.  You do not have to check in or register with the main entrance as you have in the past but will complete your check-in in the cancer center.  Wear comfortable clothing and clothing appropriate for easy access to any Portacath or PICC line.   We strive to give you quality time with your provider. You may need to reschedule your appointment if you arrive late (15 or more minutes).  Arriving late affects you and other patients whose appointments are after yours.  Also, if you miss three or more appointments without notifying the office, you may be dismissed from the clinic at the provider's discretion.      For prescription refill requests, have your pharmacy contact our office and allow 72 hours for refills to be completed.    Today you received the following chemotherapy and/or immunotherapy agents: daratumumab-hyaluronidase-fihj      To help prevent nausea and vomiting after your treatment, we encourage you to take your nausea medication as directed.  BELOW ARE SYMPTOMS THAT SHOULD BE REPORTED IMMEDIATELY: *FEVER GREATER THAN 100.4 F (38 C) OR HIGHER *CHILLS OR SWEATING *NAUSEA AND VOMITING THAT IS NOT CONTROLLED WITH YOUR NAUSEA MEDICATION *UNUSUAL SHORTNESS OF BREATH *UNUSUAL BRUISING OR BLEEDING *URINARY PROBLEMS (pain or burning when urinating, or frequent urination) *BOWEL PROBLEMS (unusual diarrhea, constipation, pain near the anus) TENDERNESS IN MOUTH AND THROAT WITH OR WITHOUT PRESENCE OF ULCERS (sore throat, sores in mouth, or a toothache) UNUSUAL RASH, SWELLING OR PAIN  UNUSUAL VAGINAL DISCHARGE OR ITCHING   Items with * indicate a potential emergency and should be followed up as soon as  possible or go to the Emergency Department if any problems should occur.  Please show the CHEMOTHERAPY ALERT CARD or IMMUNOTHERAPY ALERT CARD at check-in to the Emergency Department and triage nurse.  Should you have questions after your visit or need to cancel or reschedule your appointment, please contact [facility] CENTER AT [facility] [phone]  and follow the prompts.  Office hours are 8:00 a.m. to 4:30 p.m. Monday - Friday. Please note that voicemails left after 4:00 p.m. may not be returned until the following business day.  We are closed weekends and major holidays. You have access to a nurse at all times for urgent questions. Please call the main number to the clinic [phone] and follow the prompts.  For any non-urgent questions, you may also contact your provider using MyChart. We now offer e-Visits for anyone 18 and older to request care online for non-urgent symptoms. For details visit [email or web address].   Also download the MyChart app! Go to the app store, search "MyChart", open the app, select [place], and log in with your MyChart username and password.

## 2022-07-14 NOTE — Progress Notes (Signed)
Port flushed with good blood return noted. No bruising or swelling at site. Bandaid applied. VVS stable with no signs or symptoms of distressed noted.  

## 2022-07-14 NOTE — Patient Instructions (Signed)
[facility] at [facility] Discharge Instructions   You were seen and examined today by Dr. Ellin Saba.  He reviewed the results of your lab work which are normal/stable. Your potassium was a little low today. Restart your potassium pills at home.   We will proceed with your treatment today.   Continue Revlimid as prescribed. Continue the steroid (dexamethasone) 5 pills every week.   Return as scheduled.    Thank you for choosing [facility] at [facility] to provide your oncology and hematology care.  To afford each patient quality time with our provider, please arrive at least 15 minutes before your scheduled appointment time.   If you have a lab appointment with the Cancer Center please come in thru the Main Entrance and check in at the main information desk.  You need to re-schedule your appointment should you arrive 10 or more minutes late.  We strive to give you quality time with our providers, and arriving late affects you and other patients whose appointments are after yours.  Also, if you no show three or more times for appointments you may be dismissed from the clinic at the providers discretion.     Again, thank you for choosing [facility].  Our hope is that these requests will decrease the amount of time that you wait before being seen by our physicians.       _____________________________________________________________  Should you have questions after your visit to [facility], please contact our office at [phone] and follow the prompts.  Our office hours are 8:00 a.m. and 4:30 p.m. Monday - Friday.  Please note that voicemails left after 4:00 p.m. may not be returned until the following business day.  We are closed weekends and major holidays.  You do have access to a nurse 24-7, just call the main number to the clinic [phone] and do not press any options, hold on the line and a nurse will answer  the phone.    For prescription refill requests, have your pharmacy contact our office and allow 72 hours.    Due to Covid, you will need to wear a mask upon entering the hospital. If you do not have a mask, a mask will be given to you at the Main Entrance upon arrival. For doctor visits, patients may have 1 support person age 81 or older with them. For treatment visits, patients can not have anyone with them due to social distancing guidelines and our immunocompromised population.

## 2022-07-14 NOTE — Progress Notes (Signed)
Patient is taking Revlimid as prescribed.  She has not missed any doses and reports no side effects at this time.    Patient has been examined by Dr. Katragadda. Vital signs and labs have been reviewed by MD - ANC, Creatinine, LFTs, hemoglobin, and platelets are within treatment parameters per M.D. - pt may proceed with treatment.  Primary RN and pharmacy notified.  

## 2022-07-15 ENCOUNTER — Other Ambulatory Visit: Payer: Self-pay

## 2022-07-15 DIAGNOSIS — Z7982 Long term (current) use of aspirin: Secondary | ICD-10-CM | POA: Diagnosis not present

## 2022-07-15 DIAGNOSIS — E876 Hypokalemia: Secondary | ICD-10-CM | POA: Diagnosis not present

## 2022-07-15 DIAGNOSIS — E278 Other specified disorders of adrenal gland: Secondary | ICD-10-CM | POA: Diagnosis not present

## 2022-07-15 DIAGNOSIS — Z7984 Long term (current) use of oral hypoglycemic drugs: Secondary | ICD-10-CM | POA: Diagnosis not present

## 2022-07-15 DIAGNOSIS — I110 Hypertensive heart disease with heart failure: Secondary | ICD-10-CM | POA: Diagnosis not present

## 2022-07-15 DIAGNOSIS — C9000 Multiple myeloma not having achieved remission: Secondary | ICD-10-CM | POA: Diagnosis not present

## 2022-07-15 DIAGNOSIS — Z79899 Other long term (current) drug therapy: Secondary | ICD-10-CM | POA: Diagnosis not present

## 2022-07-15 DIAGNOSIS — I5022 Chronic systolic (congestive) heart failure: Secondary | ICD-10-CM | POA: Diagnosis not present

## 2022-07-15 DIAGNOSIS — D75839 Thrombocytosis, unspecified: Secondary | ICD-10-CM | POA: Diagnosis not present

## 2022-07-15 DIAGNOSIS — G8929 Other chronic pain: Secondary | ICD-10-CM | POA: Diagnosis not present

## 2022-07-15 DIAGNOSIS — D471 Chronic myeloproliferative disease: Secondary | ICD-10-CM | POA: Diagnosis not present

## 2022-07-15 DIAGNOSIS — Z9181 History of falling: Secondary | ICD-10-CM | POA: Diagnosis not present

## 2022-07-15 DIAGNOSIS — M519 Unspecified thoracic, thoracolumbar and lumbosacral intervertebral disc disorder: Secondary | ICD-10-CM | POA: Diagnosis not present

## 2022-07-15 DIAGNOSIS — E785 Hyperlipidemia, unspecified: Secondary | ICD-10-CM | POA: Diagnosis not present

## 2022-07-15 LAB — KAPPA/LAMBDA LIGHT CHAINS
Kappa free light chain: 56.8 mg/L — ABNORMAL HIGH (ref 3.3–19.4)
Kappa, lambda light chain ratio: 1.99 — ABNORMAL HIGH (ref 0.26–1.65)
Lambda free light chains: 28.6 mg/L — ABNORMAL HIGH (ref 5.7–26.3)

## 2022-07-15 MED ORDER — LENALIDOMIDE 15 MG PO CAPS: 15.0000 mg | ORAL_CAPSULE | Freq: Every day | ORAL | 0 refills | Status: DC

## 2022-07-15 NOTE — Telephone Encounter (Signed)
Chart reviewed. Revlimid refilled per last office note with Dr. Katragadda.  

## 2022-07-16 ENCOUNTER — Other Ambulatory Visit: Payer: Self-pay

## 2022-07-16 LAB — PROTEIN ELECTROPHORESIS, SERUM
A/G Ratio: 1.2 (ref 0.7–1.7)
Albumin ELP: 2.7 g/dL — ABNORMAL LOW (ref 2.9–4.4)
Alpha-1-Globulin: 0.3 g/dL (ref 0.0–0.4)
Alpha-2-Globulin: 0.6 g/dL (ref 0.4–1.0)
Beta Globulin: 1 g/dL (ref 0.7–1.3)
Gamma Globulin: 0.5 g/dL (ref 0.4–1.8)
Globulin, Total: 2.3 g/dL (ref 2.2–3.9)
M-Spike, %: 0.1 g/dL — ABNORMAL HIGH
Total Protein ELP: 5 g/dL — ABNORMAL LOW (ref 6.0–8.5)

## 2022-07-21 ENCOUNTER — Ambulatory Visit: Payer: Medicare Other | Admitting: Physician Assistant

## 2022-07-21 DIAGNOSIS — E876 Hypokalemia: Secondary | ICD-10-CM | POA: Diagnosis not present

## 2022-07-21 DIAGNOSIS — Z79899 Other long term (current) drug therapy: Secondary | ICD-10-CM | POA: Diagnosis not present

## 2022-07-21 DIAGNOSIS — Z7982 Long term (current) use of aspirin: Secondary | ICD-10-CM | POA: Diagnosis not present

## 2022-07-21 DIAGNOSIS — I110 Hypertensive heart disease with heart failure: Secondary | ICD-10-CM | POA: Diagnosis not present

## 2022-07-21 DIAGNOSIS — E785 Hyperlipidemia, unspecified: Secondary | ICD-10-CM | POA: Diagnosis not present

## 2022-07-21 DIAGNOSIS — I5022 Chronic systolic (congestive) heart failure: Secondary | ICD-10-CM | POA: Diagnosis not present

## 2022-07-21 DIAGNOSIS — E278 Other specified disorders of adrenal gland: Secondary | ICD-10-CM | POA: Diagnosis not present

## 2022-07-21 DIAGNOSIS — Z7984 Long term (current) use of oral hypoglycemic drugs: Secondary | ICD-10-CM | POA: Diagnosis not present

## 2022-07-21 DIAGNOSIS — D471 Chronic myeloproliferative disease: Secondary | ICD-10-CM | POA: Diagnosis not present

## 2022-07-21 DIAGNOSIS — D75839 Thrombocytosis, unspecified: Secondary | ICD-10-CM | POA: Diagnosis not present

## 2022-07-21 DIAGNOSIS — C9000 Multiple myeloma not having achieved remission: Secondary | ICD-10-CM | POA: Diagnosis not present

## 2022-07-21 DIAGNOSIS — M519 Unspecified thoracic, thoracolumbar and lumbosacral intervertebral disc disorder: Secondary | ICD-10-CM | POA: Diagnosis not present

## 2022-07-21 DIAGNOSIS — G8929 Other chronic pain: Secondary | ICD-10-CM | POA: Diagnosis not present

## 2022-07-21 DIAGNOSIS — Z9181 History of falling: Secondary | ICD-10-CM | POA: Diagnosis not present

## 2022-07-23 DIAGNOSIS — Z9181 History of falling: Secondary | ICD-10-CM | POA: Diagnosis not present

## 2022-07-23 DIAGNOSIS — E278 Other specified disorders of adrenal gland: Secondary | ICD-10-CM | POA: Diagnosis not present

## 2022-07-23 DIAGNOSIS — E876 Hypokalemia: Secondary | ICD-10-CM | POA: Diagnosis not present

## 2022-07-23 DIAGNOSIS — G8929 Other chronic pain: Secondary | ICD-10-CM | POA: Diagnosis not present

## 2022-07-23 DIAGNOSIS — Z7984 Long term (current) use of oral hypoglycemic drugs: Secondary | ICD-10-CM | POA: Diagnosis not present

## 2022-07-23 DIAGNOSIS — D75839 Thrombocytosis, unspecified: Secondary | ICD-10-CM | POA: Diagnosis not present

## 2022-07-23 DIAGNOSIS — D471 Chronic myeloproliferative disease: Secondary | ICD-10-CM | POA: Diagnosis not present

## 2022-07-23 DIAGNOSIS — E785 Hyperlipidemia, unspecified: Secondary | ICD-10-CM | POA: Diagnosis not present

## 2022-07-23 DIAGNOSIS — M519 Unspecified thoracic, thoracolumbar and lumbosacral intervertebral disc disorder: Secondary | ICD-10-CM | POA: Diagnosis not present

## 2022-07-23 DIAGNOSIS — I5022 Chronic systolic (congestive) heart failure: Secondary | ICD-10-CM | POA: Diagnosis not present

## 2022-07-23 DIAGNOSIS — Z79899 Other long term (current) drug therapy: Secondary | ICD-10-CM | POA: Diagnosis not present

## 2022-07-23 DIAGNOSIS — I110 Hypertensive heart disease with heart failure: Secondary | ICD-10-CM | POA: Diagnosis not present

## 2022-07-23 DIAGNOSIS — C9000 Multiple myeloma not having achieved remission: Secondary | ICD-10-CM | POA: Diagnosis not present

## 2022-07-23 DIAGNOSIS — Z7982 Long term (current) use of aspirin: Secondary | ICD-10-CM | POA: Diagnosis not present

## 2022-07-28 DIAGNOSIS — G8929 Other chronic pain: Secondary | ICD-10-CM | POA: Diagnosis not present

## 2022-07-28 DIAGNOSIS — E278 Other specified disorders of adrenal gland: Secondary | ICD-10-CM | POA: Diagnosis not present

## 2022-07-28 DIAGNOSIS — Z79899 Other long term (current) drug therapy: Secondary | ICD-10-CM | POA: Diagnosis not present

## 2022-07-28 DIAGNOSIS — I5022 Chronic systolic (congestive) heart failure: Secondary | ICD-10-CM | POA: Diagnosis not present

## 2022-07-28 DIAGNOSIS — Z9181 History of falling: Secondary | ICD-10-CM | POA: Diagnosis not present

## 2022-07-28 DIAGNOSIS — D75839 Thrombocytosis, unspecified: Secondary | ICD-10-CM | POA: Diagnosis not present

## 2022-07-28 DIAGNOSIS — M519 Unspecified thoracic, thoracolumbar and lumbosacral intervertebral disc disorder: Secondary | ICD-10-CM | POA: Diagnosis not present

## 2022-07-28 DIAGNOSIS — C9000 Multiple myeloma not having achieved remission: Secondary | ICD-10-CM | POA: Diagnosis not present

## 2022-07-28 DIAGNOSIS — I110 Hypertensive heart disease with heart failure: Secondary | ICD-10-CM | POA: Diagnosis not present

## 2022-07-28 DIAGNOSIS — E785 Hyperlipidemia, unspecified: Secondary | ICD-10-CM | POA: Diagnosis not present

## 2022-07-28 DIAGNOSIS — E876 Hypokalemia: Secondary | ICD-10-CM | POA: Diagnosis not present

## 2022-07-28 DIAGNOSIS — D471 Chronic myeloproliferative disease: Secondary | ICD-10-CM | POA: Diagnosis not present

## 2022-07-28 DIAGNOSIS — Z7984 Long term (current) use of oral hypoglycemic drugs: Secondary | ICD-10-CM | POA: Diagnosis not present

## 2022-07-28 DIAGNOSIS — Z7982 Long term (current) use of aspirin: Secondary | ICD-10-CM | POA: Diagnosis not present

## 2022-07-29 ENCOUNTER — Other Ambulatory Visit (HOSPITAL_COMMUNITY): Payer: Self-pay

## 2022-08-05 DIAGNOSIS — I5032 Chronic diastolic (congestive) heart failure: Secondary | ICD-10-CM | POA: Diagnosis not present

## 2022-08-05 DIAGNOSIS — Z0001 Encounter for general adult medical examination with abnormal findings: Secondary | ICD-10-CM | POA: Diagnosis not present

## 2022-08-05 DIAGNOSIS — Z1389 Encounter for screening for other disorder: Secondary | ICD-10-CM | POA: Diagnosis not present

## 2022-08-05 DIAGNOSIS — Z86711 Personal history of pulmonary embolism: Secondary | ICD-10-CM | POA: Diagnosis not present

## 2022-08-05 DIAGNOSIS — Z1331 Encounter for screening for depression: Secondary | ICD-10-CM | POA: Diagnosis not present

## 2022-08-05 DIAGNOSIS — R946 Abnormal results of thyroid function studies: Secondary | ICD-10-CM | POA: Diagnosis not present

## 2022-08-06 ENCOUNTER — Other Ambulatory Visit: Payer: Self-pay

## 2022-08-11 ENCOUNTER — Ambulatory Visit: Payer: Medicare Other

## 2022-08-11 ENCOUNTER — Inpatient Hospital Stay: Payer: Medicare Other

## 2022-08-11 ENCOUNTER — Ambulatory Visit: Payer: Medicare Other | Admitting: Hematology

## 2022-08-17 DIAGNOSIS — H5203 Hypermetropia, bilateral: Secondary | ICD-10-CM | POA: Diagnosis not present

## 2022-08-18 ENCOUNTER — Inpatient Hospital Stay: Payer: Medicare Other

## 2022-08-18 ENCOUNTER — Inpatient Hospital Stay: Payer: Medicare Other | Attending: Hematology

## 2022-08-18 VITALS — BP 97/55 | HR 63 | Temp 97.2°F | Resp 16 | Wt 121.4 lb

## 2022-08-18 DIAGNOSIS — D473 Essential (hemorrhagic) thrombocythemia: Secondary | ICD-10-CM | POA: Diagnosis not present

## 2022-08-18 DIAGNOSIS — M130 Polyarthritis, unspecified: Secondary | ICD-10-CM

## 2022-08-18 DIAGNOSIS — D509 Iron deficiency anemia, unspecified: Secondary | ICD-10-CM | POA: Insufficient documentation

## 2022-08-18 DIAGNOSIS — Z5112 Encounter for antineoplastic immunotherapy: Secondary | ICD-10-CM | POA: Diagnosis not present

## 2022-08-18 DIAGNOSIS — N189 Chronic kidney disease, unspecified: Secondary | ICD-10-CM | POA: Diagnosis not present

## 2022-08-18 DIAGNOSIS — C9000 Multiple myeloma not having achieved remission: Secondary | ICD-10-CM | POA: Diagnosis not present

## 2022-08-18 DIAGNOSIS — I10 Essential (primary) hypertension: Secondary | ICD-10-CM

## 2022-08-18 DIAGNOSIS — I129 Hypertensive chronic kidney disease with stage 1 through stage 4 chronic kidney disease, or unspecified chronic kidney disease: Secondary | ICD-10-CM | POA: Diagnosis not present

## 2022-08-18 DIAGNOSIS — D631 Anemia in chronic kidney disease: Secondary | ICD-10-CM | POA: Insufficient documentation

## 2022-08-18 DIAGNOSIS — D471 Chronic myeloproliferative disease: Secondary | ICD-10-CM

## 2022-08-18 DIAGNOSIS — Z95828 Presence of other vascular implants and grafts: Secondary | ICD-10-CM

## 2022-08-18 DIAGNOSIS — N179 Acute kidney failure, unspecified: Secondary | ICD-10-CM

## 2022-08-18 LAB — BASIC METABOLIC PANEL
Anion gap: 10 (ref 5–15)
BUN: 24 mg/dL — ABNORMAL HIGH (ref 8–23)
CO2: 22 mmol/L (ref 22–32)
Calcium: 8.6 mg/dL — ABNORMAL LOW (ref 8.9–10.3)
Chloride: 104 mmol/L (ref 98–111)
Creatinine, Ser: 1.66 mg/dL — ABNORMAL HIGH (ref 0.44–1.00)
GFR, Estimated: 29 mL/min — ABNORMAL LOW (ref >60)
Glucose, Bld: 170 mg/dL — ABNORMAL HIGH (ref 70–99)
Potassium: 3.6 mmol/L (ref 3.5–5.1)
Sodium: 136 mmol/L (ref 135–145)

## 2022-08-18 LAB — CBC WITH DIFFERENTIAL/PLATELET
Abs Immature Granulocytes: 0.03 10*3/uL (ref 0.00–0.07)
Basophils Absolute: 0.1 10*3/uL (ref 0.0–0.1)
Basophils Relative: 1 %
Eosinophils Absolute: 0.1 10*3/uL (ref 0.0–0.5)
Eosinophils Relative: 1 %
HCT: 27.4 % — ABNORMAL LOW (ref 36.0–46.0)
Hemoglobin: 8.4 g/dL — ABNORMAL LOW (ref 12.0–15.0)
Immature Granulocytes: 1 %
Lymphocytes Relative: 33 %
Lymphs Abs: 2.1 10*3/uL (ref 0.7–4.0)
MCH: 26.3 pg (ref 26.0–34.0)
MCHC: 30.7 g/dL (ref 30.0–36.0)
MCV: 85.6 fL (ref 80.0–100.0)
Monocytes Absolute: 1.1 10*3/uL — ABNORMAL HIGH (ref 0.1–1.0)
Monocytes Relative: 18 %
Neutro Abs: 3 10*3/uL (ref 1.7–7.7)
Neutrophils Relative %: 46 %
Platelets: 380 10*3/uL (ref 150–400)
RBC: 3.2 MIL/uL — ABNORMAL LOW (ref 3.87–5.11)
RDW: 21 % — ABNORMAL HIGH (ref 11.5–15.5)
WBC: 6.3 10*3/uL (ref 4.0–10.5)
nRBC: 0.8 % — ABNORMAL HIGH (ref 0.0–0.2)

## 2022-08-18 LAB — HEPATIC FUNCTION PANEL
ALT: 10 U/L (ref 0–44)
AST: 11 U/L — ABNORMAL LOW (ref 15–41)
Albumin: 3.2 g/dL — ABNORMAL LOW (ref 3.5–5.0)
Alkaline Phosphatase: 49 U/L (ref 38–126)
Bilirubin, Direct: 0.1 mg/dL (ref 0.0–0.2)
Indirect Bilirubin: 0.6 mg/dL (ref 0.3–0.9)
Total Bilirubin: 0.7 mg/dL (ref 0.3–1.2)
Total Protein: 5.5 g/dL — ABNORMAL LOW (ref 6.5–8.1)

## 2022-08-18 LAB — LIPID PANEL
Cholesterol: 138 mg/dL (ref 0–200)
HDL: 42 mg/dL (ref >40)
LDL Cholesterol: 82 mg/dL (ref 0–99)
Total CHOL/HDL Ratio: 3.3 RATIO
Triglycerides: 69 mg/dL (ref <150)
VLDL: 14 mg/dL (ref 0–40)

## 2022-08-18 MED ORDER — HEPARIN SOD (PORK) LOCK FLUSH 100 UNIT/ML IV SOLN
500.0000 [IU] | Freq: Once | INTRAVENOUS | Status: AC
  Administered 2022-08-18: 500 [IU] via INTRAVENOUS

## 2022-08-18 MED ORDER — DARATUMUMAB-HYALURONIDASE-FIHJ 1800-30000 MG-UT/15ML ~~LOC~~ SOLN
1800.0000 mg | Freq: Once | SUBCUTANEOUS | Status: AC
  Administered 2022-08-18: 1800 mg via SUBCUTANEOUS
  Filled 2022-08-18: qty 15

## 2022-08-18 MED ORDER — SODIUM CHLORIDE 0.9% FLUSH
10.0000 mL | Freq: Once | INTRAVENOUS | Status: AC
  Administered 2022-08-18: 10 mL via INTRAVENOUS

## 2022-08-18 MED ORDER — ACETAMINOPHEN 325 MG PO TABS
650.0000 mg | ORAL_TABLET | Freq: Once | ORAL | Status: AC
  Administered 2022-08-18: 650 mg via ORAL
  Filled 2022-08-18: qty 2

## 2022-08-18 MED ORDER — CETIRIZINE HCL 10 MG PO TABS
10.0000 mg | ORAL_TABLET | Freq: Once | ORAL | Status: AC
  Administered 2022-08-18: 10 mg via ORAL
  Filled 2022-08-18: qty 1

## 2022-08-18 NOTE — Patient Instructions (Signed)
[facility]  Discharge Instructions: Thank you for choosing [facility] to provide your oncology and hematology care.  If you have a lab appointment with the Cancer Center - please note that after April 8th, 2024, all labs will be drawn in the cancer center.  You do not have to check in or register with the main entrance as you have in the past but will complete your check-in in the cancer center.  Wear comfortable clothing and clothing appropriate for easy access to any Portacath or PICC line.   We strive to give you quality time with your provider. You may need to reschedule your appointment if you arrive late (15 or more minutes).  Arriving late affects you and other patients whose appointments are after yours.  Also, if you miss three or more appointments without notifying the office, you may be dismissed from the clinic at the provider's discretion.      For prescription refill requests, have your pharmacy contact our office and allow 72 hours for refills to be completed.    Today you received the following chemotherapy and/or immunotherapy agents Daratumumab [place]   To help prevent nausea and vomiting after your treatment, we encourage you to take your nausea medication as directed.  Daratumumab; Hyaluronidase Injection What is this medication? DARATUMUMAB; HYALURONIDASE (dar a toom ue mab; hye al ur ON i dase) treats multiple myeloma, a type of bone marrow cancer. Daratumumab works by blocking a protein that causes cancer cells to grow and multiply. This helps to slow or stop the spread of cancer cells. Hyaluronidase works by increasing the absorption of other medications in the body to help them work better. This medication may also be used treat amyloidosis, a condition that causes the buildup of a protein (amyloid) in your body. It works by reducing the buildup of this protein, which decreases symptoms. It is a combination medication that contains a  monoclonal antibody. This medicine may be used for other purposes; ask your health care provider or pharmacist if you have questions. COMMON BRAND NAME(S): DARZALEX FASPRO What should I tell my care team before I take this medication? They need to know if you have any of these conditions: Heart disease Infection, such as chickenpox, cold sores, herpes, hepatitis B Lung or breathing disease An unusual or allergic reaction to daratumumab, hyaluronidase, other medications, foods, dyes, or preservatives Pregnant or trying to get pregnant Breast-feeding How should I use this medication? This medication is injected under the skin. It is given by your care team in a hospital or clinic setting. Talk to your care team about the use of this medication in children. Special care may be needed. Overdosage: If you think you have taken too much of this medicine contact a poison control center or emergency room at once. NOTE: This medicine is only for you. Do not share this medicine with others. What if I miss a dose? Keep appointments for follow-up doses. It is important not to miss your dose. Call your care team if you are unable to keep an appointment. What may interact with this medication? Interactions have not been studied. This list may not describe all possible interactions. Give your health care provider a list of all the medicines, herbs, non-prescription drugs, or dietary supplements you use. Also tell them if you smoke, drink alcohol, or use illegal drugs. Some items may interact with your medicine. What should I watch for while using this medication? Your condition will be monitored  carefully while you are receiving this medication. This medication can cause serious allergic reactions. To reduce your risk, your care team may give you other medication to take before receiving this one. Be sure to follow the directions from your care team. This medication can affect the results of blood tests to  match your blood type. These changes can last for up to 6 months after the final dose. Your care team will do blood tests to match your blood type before you start treatment. Tell all of your care team that you are being treated with this medication before receiving a blood transfusion. This medication can affect the results of some tests used to determine treatment response; extra tests may be needed to evaluate response. Talk to your care team if you wish to become pregnant or think you are pregnant. This medication can cause serious birth defects if taken during pregnancy and for 3 months after the last dose. A reliable form of contraception is recommended while taking this medication and for 3 months after the last dose. Talk to your care team about effective forms of contraception. Do not breast-feed while taking this medication. What side effects may I notice from receiving this medication? Side effects that you should report to your care team as soon as possible: Allergic reactions--skin rash, itching, hives, swelling of the face, lips, tongue, or throat Heart rhythm changes--fast or irregular heartbeat, dizziness, feeling faint or lightheaded, chest pain, trouble breathing Infection--fever, chills, cough, sore throat, wounds that don't heal, pain or trouble when passing urine, general feeling of discomfort or being unwell Infusion reactions--chest pain, shortness of breath or trouble breathing, feeling faint or lightheaded Sudden eye pain or change in vision such as blurry vision, seeing halos around lights, vision loss Unusual bruising or bleeding Side effects that usually do not require medical attention (report to your care team if they continue or are bothersome): Constipation Diarrhea Fatigue Nausea Pain, tingling, or numbness in the hands or feet Swelling of the ankles, hands, or feet This list may not describe all possible side effects. Call your doctor for medical advice about side  effects. You may report side effects to FDA at 1-800-FDA-1088. Where should I keep my medication? This medication is given in a hospital or clinic. It will not be stored at home. NOTE: This sheet is a summary. It may not cover all possible information. If you have questions about this medicine, talk to your doctor, pharmacist, or health care provider.  2024 Elsevier/Gold Standard (2021-06-16 00:00:00)   BELOW ARE SYMPTOMS THAT SHOULD BE REPORTED IMMEDIATELY: *FEVER GREATER THAN 100.4 F (38 C) OR HIGHER *CHILLS OR SWEATING *NAUSEA AND VOMITING THAT IS NOT CONTROLLED WITH YOUR NAUSEA MEDICATION *UNUSUAL SHORTNESS OF BREATH *UNUSUAL BRUISING OR BLEEDING *URINARY PROBLEMS (pain or burning when urinating, or frequent urination) *BOWEL PROBLEMS (unusual diarrhea, constipation, pain near the anus) TENDERNESS IN MOUTH AND THROAT WITH OR WITHOUT PRESENCE OF ULCERS (sore throat, sores in mouth, or a toothache) UNUSUAL RASH, SWELLING OR PAIN  UNUSUAL VAGINAL DISCHARGE OR ITCHING   Items with * indicate a potential emergency and should be followed up as soon as possible or go to the Emergency Department if any problems should occur.  Please show the CHEMOTHERAPY ALERT CARD or IMMUNOTHERAPY ALERT CARD at check-in to the Emergency Department and triage nurse.  Should you have questions after your visit or need to cancel or reschedule your appointment, please contact [facility] CENTER AT [facility] [phone]  and follow the prompts.  Office hours are 8:00 a.m. to 4:30 p.m. Monday - Friday. Please note that voicemails left after 4:00 p.m. may not be returned until the following business day.  We are closed weekends and major holidays. You have access to a nurse at all times for urgent questions. Please call the main number to the clinic [phone] and follow the prompts.  For any non-urgent questions, you may also contact your provider using MyChart. We now offer e-Visits for anyone 75 and older to  request care online for non-urgent symptoms. For details visit [email or web address].   Also download the MyChart app! Go to the app store, search "MyChart", open the app, select [place], and log in with your MyChart username and password.

## 2022-08-18 NOTE — Progress Notes (Signed)
Port flushed with good blood return noted. No bruising or swelling at site. Bandaid applied. 

## 2022-08-18 NOTE — Progress Notes (Signed)
Patient presents today for Daratumumab  injection.  Patient is in satisfactory condition with no new complaints voiced.  Vital signs are stable.  Labs reviewed and all labs are within treatment parameters.  We will proceed with treatment per MD orders.    Treatment given today per MD orders. Tolerated infusion without adverse affects. Vital signs stable. No complaints at this time. Discharged from clinic via wheelchair in stable condition. Alert and oriented x 3. F/U with [facility] as scheduled.

## 2022-08-21 ENCOUNTER — Other Ambulatory Visit: Payer: Self-pay

## 2022-08-23 ENCOUNTER — Other Ambulatory Visit: Payer: Self-pay

## 2022-08-23 MED ORDER — LENALIDOMIDE 15 MG PO CAPS: 15.0000 mg | ORAL_CAPSULE | Freq: Every day | ORAL | 0 refills | Status: DC

## 2022-08-23 NOTE — Telephone Encounter (Signed)
Chart reviewed. Revlimid refilled per last office note with Dr. Katragadda.  

## 2022-08-25 ENCOUNTER — Other Ambulatory Visit: Payer: Self-pay

## 2022-09-06 ENCOUNTER — Other Ambulatory Visit: Payer: Self-pay

## 2022-09-08 ENCOUNTER — Other Ambulatory Visit: Payer: Medicare Other

## 2022-09-08 ENCOUNTER — Ambulatory Visit: Payer: Medicare Other

## 2022-09-08 ENCOUNTER — Ambulatory Visit: Payer: Medicare Other | Admitting: Hematology

## 2022-09-08 DIAGNOSIS — R609 Edema, unspecified: Secondary | ICD-10-CM | POA: Diagnosis not present

## 2022-09-08 DIAGNOSIS — I5032 Chronic diastolic (congestive) heart failure: Secondary | ICD-10-CM | POA: Diagnosis not present

## 2022-09-08 DIAGNOSIS — I10 Essential (primary) hypertension: Secondary | ICD-10-CM | POA: Diagnosis not present

## 2022-09-14 NOTE — Progress Notes (Signed)
[facility] [address] [place] [address]    Clinic Day:  09/15/2022  Referring physician: Benetta Spar*  Patient Care Team: Benetta Spar, MD as PCP - General (Internal Medicine) Wyline Mood Dorothe Pea, MD as PCP - Cardiology (Cardiology) Doreatha Massed, MD as Medical Oncologist (Medical Oncology)   ASSESSMENT & PLAN:   Assessment: 1.  Biclonal IgG lambda and IgM kappa multiple myeloma, high risk: - BMBX (11/02/2021): IgG lambda plasma cells 70% - Myeloma FISH panel: Monosomy 13 (standard GIST), duplication of 1 q. (high risk), gain of 11 q. (standard risk). - Cytogenetics: Hyperdiploid E weight gain (trisomy/trisomy) of odd-numbered chromosomes and monosomy 13, standard risk.  Gain of 1 q. associated with progressive course. - PET scan (11/05/2021): No evidence of multiple myeloma.  No hypermetabolic adenopathy. - Daratumumab, Revlimid and dexamethasone cycle 1 started on 11/30/2021   2.  JAK2 positive essential thrombocytosis: - BMBX (11/02/2021): No evidence of fibrosis.  No morphological evidence of JAK2 positive ET.   3.  History of marginal zone lymphoma of the spleen: - Diagnosed in 1999, status post CHOP x7 cycles followed by rituximab 1 cycle. - Status post splenectomy.  No evidence of lymphoma on current PET scan.   4.  Social/family history: - She lives with her sister.  Son lives 30 minutes away.  She is independent of ADLs and some IADLs.  She drives.  She worked at [employer] and denies any exposure to chemicals or pesticides.  No family history of malignancies.  5.  Bone protection (DEXA scan 02/17/2022 T-score -0.9): - She saw dentist who recommended dental extractions.  She does not want zoledronic acid or denosumab.    Plan: 1.  IgG lambda multiple myeloma: - She is tolerating Revlimid reasonably well. - She denies any infections in the past couple of months. - She is taking Lasix 40 mg daily.   Creatinine today is 1.47.  LFTs show albumin 2.9.  Last myeloma panel from 07/14/2022 shows improvement in M spike to 0.1.  Light chain ratio improved to 1.99. - Myeloma panel from today is pending. - She has leg swellings.  I will decrease weekly dexamethasone dose to 10 mg. - Continue Darzalex monthly.  Continue Revlimid 15 mg 3 weeks on/1 week off.  RTC 12 weeks for follow-up with multiple myeloma labs 4 weeks prior.   2.  Anemia from CKD and iron deficiency: - Hemoglobin is 8.4 today.  Ferritin is low at 7 and percent saturation at 5. - We will give her Feraheme weekly x 2.   3.  ID prophylaxis: - Continue acyclovir twice daily.   4.  Pulmonary embolism (Dx 07/03/2022):: - Continue Eliquis twice daily indefinitely.   5.  Hypokalemia: - Continue potassium 40 mg daily.  Potassium today is 3.7.   6.  Left lower back pain radiating to the left posterior thigh: - Continue Toradol as needed.    Orders Placed This Encounter  Procedures   Iron and TIBC ([facility] ONLY)   Ferritin   CBC with Differential    Standing Status:   Future    Standing Expiration Date:   12/30/2023   CBC with Differential    Standing Status:   Future    Standing Expiration Date:   01/27/2024   CBC with Differential    Standing Status:   Future    Standing Expiration Date:   02/24/2024      Alben Deeds Teague,acting as a scribe for  Doreatha Massed, MD.,have documented all relevant documentation on the behalf of Doreatha Massed, MD,as directed by  Doreatha Massed, MD while in the presence of Doreatha Massed, MD.  I, Doreatha Massed MD, have reviewed the above documentation for accuracy and completeness, and I agree with the above.    Doreatha Massed, MD   7/24/20247:03 PM  CHIEF COMPLAINT:   Diagnosis: multiple myeloma    Cancer Staging  Multiple myeloma (HCC) Staging form: Plasma Cell Myeloma and Plasma Cell Disorders, AJCC 8th Edition - Clinical stage from  11/16/2021: Albumin (g/dL): 3.1, ISS: Stage II, High-risk cytogenetics: Absent, LDH: Normal - Unsigned    Prior Therapy: none  Current Therapy:  Daratumumab, Revlimid and dexamethasone    HISTORY OF PRESENT ILLNESS:   Oncology History  NHL (non-Hodgkin's lymphoma) (HCC)  07/19/1995 Pathology Results   Spleen biopsy with resection- low-grade B-cell lymphoma, splenic marginal zone type   02/20/1998 Imaging   CT CAP-marked and extensive cervical adenopathy   02/25/1998 Pathology Results   Right cervical lymph node demonstrating large B-cell lymphoma   03/05/1998 Bone Marrow Biopsy   No evidence of bone marrow involvement   03/11/1998 - 07/18/1998 Chemotherapy   CHOP x 7 cycles   05/09/1998 Imaging   CT CAP and neck- slight interval decrease in size of para-aortic adenopathy with interval decrease in right inguinal and bi-iliac adenopathy.  Interval decrease in size and number of enlarged cervical nodes   07/10/1998 Imaging   Ct CAP and neck- unremarkable CT of chest. No enlarged retroperitoneal adenopathy withthe previously noted retroperitoneal nodes currently smaller to stable in size.  Cervical adenopathy is stable to slightly smaller in size.   08/18/1998 - 09/08/1998 Chemotherapy   Rituxan Day 1, 8, 15, 22 x 1 cycle   01/08/1999 Remission   CT CAP and neck demonstrates no adenopathy or disease   01/08/1999 Imaging   CT CAP and neck- Stable CT of chest. Stable CT abd/pelvis without adenopathy.  Negative CT of neck   Multiple myeloma (HCC)  11/16/2021 Initial Diagnosis   Multiple myeloma (HCC)   11/30/2021 -  Chemotherapy   Patient is on Treatment Plan : MYELOMA  Daratumumab SQ + Lenalidomide + Dexamethasone (DaraRd) q28d        INTERVAL HISTORY:   Leslie Williamson is a 89 y.o. female presenting to clinic today for follow up of multiple myeloma. She was last seen by me on 07/14/22.  Today, she states that she is doing well overall. Her appetite level is at 70%. Her energy level is at  70%.  Her bilateral feet are swollen. She notes her feet are not swollen when she wakes up, but that is worsens as the day progresses. She elevates her feet to somewhat improve the swelling. She is currently taking Lasix, but not ASA. She is taking Revlimid as prescribed. She denies nausea, vomiting, and infections in the last 2 months with Revlimid. She notes very mild constipation. She reports no issues with her monthly shot.   She notes she has lost weight, 2.5 pounds since last visit, which she attributes to her low appetite at the time. Her appetite has since improved.   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)    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   Vertigo, labyrinthine     Surgical History: Past Surgical History:  Procedure Laterality Date   CATARACT EXTRACTION W/PHACO Left 11/03/2015   Procedure: CATARACT EXTRACTION PHACO AND INTRAOCULAR LENS PLACEMENT (IOC);  Surgeon: Gemma Payor, MD;  Location: [facility];  Service: Ophthalmology;  Laterality: Left;  CDE: 7.74   CATARACT EXTRACTION W/PHACO Right 11/20/2015   Procedure: CATARACT EXTRACTION PHACO AND INTRAOCULAR LENS PLACEMENT ; CDE:  8.30;  Surgeon: Gemma Payor, MD;  Location: [facility];  Service: Ophthalmology;  Laterality: Right;   IR IMAGING GUIDED PORT INSERTION  11/04/2021   PORT-A-CATH REMOVAL      Social History: Social History    Socioeconomic History   Marital status: Widowed    Spouse name: Not on file   Number of children: Not on file   Years of education: Not on file   Highest education level: Not on file  Occupational History   Not on file  Tobacco Use   Smoking status: Former   Smokeless tobacco: Never  Vaping Use   Vaping status: Never Used  Substance and Sexual Activity   Alcohol use: No   Drug use: No   Sexual activity: Not Currently  Other Topics Concern   Not on file  Social History Narrative   Not on file   Social Determinants of Health   Financial Resource Strain: Low Risk  (12/31/2019)   Overall Financial Resource Strain (CARDIA)    Difficulty of Paying Living Expenses: Not hard at all  Food Insecurity: No Food Insecurity (07/03/2022)   Hunger Vital Sign    Worried About Running Out of Food in the Last Year: Never true    Ran Out of Food in the Last Year: Never true  Transportation Needs: No Transportation Needs (07/03/2022)   PRAPARE - Administrator, Civil Service (Medical): No    Lack of Transportation (Non-Medical): No  Physical Activity: Inactive (12/31/2019)   Exercise Vital Sign    Days of Exercise per Week: 0 days    Minutes of Exercise per Session: 0 min  Stress: No Stress Concern Present (12/31/2019)   [employer] of Occupational Health - Occupational Stress Questionnaire    Feeling of Stress : Not at all  Social Connections: Moderately Isolated (12/31/2019)   Social Connection and Isolation Panel [NHANES]    Frequency of Communication with Friends and Family: More than three times a week    Frequency of Social Gatherings with Friends and Family: More than three times a week    Attends Religious Services: More than 4 times per year    Active Member of [employer] or Organizations: No    Attends Banker Meetings: Never    Marital Status: Widowed  Intimate Partner Violence: Not At Risk (07/03/2022)   Humiliation, Afraid, Rape, and Kick questionnaire     Fear of Current or Ex-Partner: No    Emotionally Abused: No    Physically Abused: No    Sexually Abused: No    Family History: Family History  Problem Relation Age of Onset   Diabetes Mother  Current Medications:  Current Outpatient Medications:    acetaminophen (TYLENOL) 500 MG tablet, Take 1,000 mg by mouth every 6 (six) hours as needed for moderate pain., Disp: , Rfl:    acyclovir (ZOVIRAX) 400 MG tablet, Take 1 tablet (400 mg total) by mouth 2 (two) times daily., Disp: 60 tablet, Rfl: 6   apixaban (ELIQUIS) 5 MG TABS tablet, Take 1 tablet (5 mg total) by mouth 2 (two) times daily., Disp: 60 tablet, Rfl: 1   APIXABAN (ELIQUIS) VTE STARTER PACK (10MG  AND 5MG ), Take as directed on package: start with two-5mg  tablets twice daily for 7 days. On day 8, switch to one-5mg  tablet twice daily., Disp: 74 each, Rfl: 0   dapagliflozin propanediol (FARXIGA) 10 MG TABS tablet, Take 1 tablet (10 mg total) by mouth daily., Disp: 30 tablet, Rfl: 1   dexamethasone (DECADRON) 4 MG tablet, Take 20 mg (5 tablets) by mouth weekly every Wednesday morning, Disp: 20 tablet, Rfl: 6   furosemide (LASIX) 40 MG tablet, Take 40mg  as needed if you gain more than 3 pounds in 1 day or 5 pounds in 1 week, Disp: 30 tablet, Rfl: 1   lenalidomide (REVLIMID) 15 MG capsule, Take 1 capsule (15 mg total) by mouth daily. Take for 21 days on, 7 days off., Disp: 21 capsule, Rfl: 0   metoprolol succinate (TOPROL-XL) 25 MG 24 hr tablet, Take 0.5 tablets (12.5 mg total) by mouth daily., Disp: 30 tablet, Rfl: 0   potassium chloride SA (KLOR-CON M20) 20 MEQ tablet, Take 1 tablet on the days you take lasix, Disp: 180 tablet, Rfl: 3   Allergies: Allergies  Allergen Reactions   Penicillins Rash    REVIEW OF SYSTEMS:   Review of Systems  Constitutional:  Negative for chills, fatigue and fever.  HENT:   Negative for lump/mass, mouth sores, nosebleeds, sore throat and trouble swallowing.   Eyes:  Negative for eye problems.   Respiratory:  Negative for cough and shortness of breath.   Cardiovascular:  Negative for chest pain, leg swelling and palpitations.  Gastrointestinal:  Negative for abdominal pain, constipation, diarrhea, nausea and vomiting.  Genitourinary:  Negative for bladder incontinence, difficulty urinating, dysuria, frequency, hematuria and nocturia.   Musculoskeletal:  Negative for arthralgias, back pain, flank pain, myalgias and neck pain.  Skin:  Negative for itching and rash.  Neurological:  Negative for dizziness, headaches and numbness.  Hematological:  Does not bruise/bleed easily.  Psychiatric/Behavioral:  Negative for depression, sleep disturbance and suicidal ideas. The patient is not nervous/anxious.   All other systems reviewed and are negative.    VITALS:   Blood pressure 115/72, pulse 80, temperature 97.6 F (36.4 C), resp. rate 18, height 5' (1.524 m), weight 122 lb 12.8 oz (55.7 kg), SpO2 97%.  Wt Readings from Last 3 Encounters:  09/15/22 122 lb 12.8 oz (55.7 kg)  08/18/22 121 lb 6.4 oz (55.1 kg)  07/14/22 125 lb 3.2 oz (56.8 kg)    Body mass index is 23.98 kg/m.  Performance status (ECOG): 1 - Symptomatic but completely ambulatory  PHYSICAL EXAM:   Physical Exam Vitals and nursing note reviewed. Exam conducted with a chaperone present.  Constitutional:      Appearance: Normal appearance.  Cardiovascular:     Rate and Rhythm: Normal rate and regular rhythm.     Pulses: Normal pulses.     Heart sounds: Normal heart sounds.  Pulmonary:     Effort: Pulmonary effort is normal.     Breath sounds: Normal breath  sounds.  Abdominal:     Palpations: Abdomen is soft. There is no hepatomegaly, splenomegaly or mass.     Tenderness: There is no abdominal tenderness.  Musculoskeletal:     Right lower leg: 2+ Edema present.     Left lower leg: 2+ Edema present.  Lymphadenopathy:     Cervical: No cervical adenopathy.     Right cervical: No superficial, deep or posterior  cervical adenopathy.    Left cervical: No superficial, deep or posterior cervical adenopathy.     Upper Body:     Right upper body: No supraclavicular or axillary adenopathy.     Left upper body: No supraclavicular or axillary adenopathy.  Neurological:     General: No focal deficit present.     Mental Status: She is alert and oriented to person, place, and time.  Psychiatric:        Mood and Affect: Mood normal.        Behavior: Behavior normal.     LABS:      Latest Ref Rng & Units 09/15/2022   11:33 AM 08/18/2022   12:29 PM 07/14/2022   12:13 PM  CBC  WBC 4.0 - 10.5 K/uL 8.8  6.3  7.9   Hemoglobin 12.0 - 15.0 g/dL 8.4  8.4  9.5   Hematocrit 36.0 - 46.0 % 27.8  27.4  29.1   Platelets 150 - 400 K/uL 435  380  559       Latest Ref Rng & Units 09/15/2022   11:33 AM 08/18/2022    2:12 PM 07/14/2022   12:13 PM  CMP  Glucose 70 - 99 mg/dL 960  454  098   BUN 8 - 23 mg/dL 35  24  21   Creatinine 0.44 - 1.00 mg/dL 1.19  1.47  8.29   Sodium 135 - 145 mmol/L 138  136  137   Potassium 3.5 - 5.1 mmol/L 3.7  3.6  3.3   Chloride 98 - 111 mmol/L 108  104  105   CO2 22 - 32 mmol/L 22  22  23    Calcium 8.9 - 10.3 mg/dL 8.7  8.6  8.6   Total Protein 6.5 - 8.1 g/dL 5.5  5.5  5.6   Total Bilirubin 0.3 - 1.2 mg/dL 0.4  0.7  0.6   Alkaline Phos 38 - 126 U/L 45  49  51   AST 15 - 41 U/L 12  11  13    ALT 0 - 44 U/L 12  10  10       No results found for: "CEA1", "CEA" / No results found for: "CEA1", "CEA" No results found for: "PSA1" No results found for: "CAN199" No results found for: "CAN125"  Lab Results  Component Value Date   TOTALPROTELP 5.0 (L) 07/14/2022   ALBUMINELP 2.7 (L) 07/14/2022   A1GS 0.3 07/14/2022   A2GS 0.6 07/14/2022   BETS 1.0 07/14/2022   GAMS 0.5 07/14/2022   MSPIKE 0.1 (H) 07/14/2022   SPEI Comment 07/14/2022   Lab Results  Component Value Date   TIBC 375 09/15/2022   TIBC 249 (L) 03/10/2022   TIBC 291 09/30/2021   FERRITIN 7 (L) 09/15/2022   FERRITIN  93 03/10/2022   FERRITIN 45 09/30/2021   IRONPCTSAT 5 (L) 09/15/2022   IRONPCTSAT 8 (L) 03/10/2022   IRONPCTSAT 19 09/30/2021   Lab Results  Component Value Date   LDH 154 09/30/2021   LDH 148 08/05/2021   LDH 122 07/22/2021  STUDIES:   No results found.

## 2022-09-15 ENCOUNTER — Inpatient Hospital Stay: Payer: Medicare Other | Admitting: Hematology

## 2022-09-15 ENCOUNTER — Inpatient Hospital Stay: Payer: Medicare Other | Attending: Hematology

## 2022-09-15 ENCOUNTER — Inpatient Hospital Stay: Payer: Medicare Other

## 2022-09-15 VITALS — BP 115/72 | HR 80 | Temp 97.6°F | Resp 18 | Ht 60.0 in | Wt 122.8 lb

## 2022-09-15 DIAGNOSIS — N189 Chronic kidney disease, unspecified: Secondary | ICD-10-CM | POA: Diagnosis not present

## 2022-09-15 DIAGNOSIS — C9000 Multiple myeloma not having achieved remission: Secondary | ICD-10-CM

## 2022-09-15 DIAGNOSIS — D75839 Thrombocytosis, unspecified: Secondary | ICD-10-CM | POA: Insufficient documentation

## 2022-09-15 DIAGNOSIS — Z5112 Encounter for antineoplastic immunotherapy: Secondary | ICD-10-CM | POA: Insufficient documentation

## 2022-09-15 DIAGNOSIS — D631 Anemia in chronic kidney disease: Secondary | ICD-10-CM | POA: Insufficient documentation

## 2022-09-15 DIAGNOSIS — D509 Iron deficiency anemia, unspecified: Secondary | ICD-10-CM

## 2022-09-15 DIAGNOSIS — E876 Hypokalemia: Secondary | ICD-10-CM | POA: Diagnosis not present

## 2022-09-15 LAB — COMPREHENSIVE METABOLIC PANEL
ALT: 12 U/L (ref 0–44)
AST: 12 U/L — ABNORMAL LOW (ref 15–41)
Albumin: 2.9 g/dL — ABNORMAL LOW (ref 3.5–5.0)
Alkaline Phosphatase: 45 U/L (ref 38–126)
Anion gap: 8 (ref 5–15)
BUN: 35 mg/dL — ABNORMAL HIGH (ref 8–23)
CO2: 22 mmol/L (ref 22–32)
Calcium: 8.7 mg/dL — ABNORMAL LOW (ref 8.9–10.3)
Chloride: 108 mmol/L (ref 98–111)
Creatinine, Ser: 1.47 mg/dL — ABNORMAL HIGH (ref 0.44–1.00)
GFR, Estimated: 34 mL/min — ABNORMAL LOW (ref >60)
Glucose, Bld: 101 mg/dL — ABNORMAL HIGH (ref 70–99)
Potassium: 3.7 mmol/L (ref 3.5–5.1)
Sodium: 138 mmol/L (ref 135–145)
Total Bilirubin: 0.4 mg/dL (ref 0.3–1.2)
Total Protein: 5.5 g/dL — ABNORMAL LOW (ref 6.5–8.1)

## 2022-09-15 LAB — IRON AND TIBC
Iron: 20 ug/dL — ABNORMAL LOW (ref 28–170)
Saturation Ratios: 5 % — ABNORMAL LOW (ref 10.4–31.8)
TIBC: 375 ug/dL (ref 250–450)
UIBC: 355 ug/dL

## 2022-09-15 LAB — CBC WITH DIFFERENTIAL/PLATELET
Abs Immature Granulocytes: 0.06 10*3/uL (ref 0.00–0.07)
Basophils Absolute: 0 10*3/uL (ref 0.0–0.1)
Basophils Relative: 1 %
Eosinophils Absolute: 0.1 10*3/uL (ref 0.0–0.5)
Eosinophils Relative: 1 %
HCT: 27.8 % — ABNORMAL LOW (ref 36.0–46.0)
Hemoglobin: 8.4 g/dL — ABNORMAL LOW (ref 12.0–15.0)
Immature Granulocytes: 1 %
Lymphocytes Relative: 29 %
Lymphs Abs: 2.6 10*3/uL (ref 0.7–4.0)
MCH: 24.9 pg — ABNORMAL LOW (ref 26.0–34.0)
MCHC: 30.2 g/dL (ref 30.0–36.0)
MCV: 82.5 fL (ref 80.0–100.0)
Monocytes Absolute: 1.3 10*3/uL — ABNORMAL HIGH (ref 0.1–1.0)
Monocytes Relative: 15 %
Neutro Abs: 4.7 10*3/uL (ref 1.7–7.7)
Neutrophils Relative %: 53 %
Platelets: 435 10*3/uL — ABNORMAL HIGH (ref 150–400)
RBC: 3.37 MIL/uL — ABNORMAL LOW (ref 3.87–5.11)
RDW: 19.8 % — ABNORMAL HIGH (ref 11.5–15.5)
WBC: 8.8 10*3/uL (ref 4.0–10.5)
nRBC: 0.3 % — ABNORMAL HIGH (ref 0.0–0.2)

## 2022-09-15 LAB — MAGNESIUM: Magnesium: 2.1 mg/dL (ref 1.7–2.4)

## 2022-09-15 LAB — FERRITIN: Ferritin: 7 ng/mL — ABNORMAL LOW (ref 11–307)

## 2022-09-15 MED ORDER — ACETAMINOPHEN 325 MG PO TABS
650.0000 mg | ORAL_TABLET | Freq: Once | ORAL | Status: AC
  Administered 2022-09-15: 650 mg via ORAL
  Filled 2022-09-15: qty 2

## 2022-09-15 MED ORDER — DARATUMUMAB-HYALURONIDASE-FIHJ 1800-30000 MG-UT/15ML ~~LOC~~ SOLN
1800.0000 mg | Freq: Once | SUBCUTANEOUS | Status: AC
  Administered 2022-09-15: 1800 mg via SUBCUTANEOUS
  Filled 2022-09-15: qty 15

## 2022-09-15 MED ORDER — CETIRIZINE HCL 10 MG PO TABS
10.0000 mg | ORAL_TABLET | Freq: Once | ORAL | Status: AC
  Administered 2022-09-15: 10 mg via ORAL
  Filled 2022-09-15: qty 1

## 2022-09-15 NOTE — Patient Instructions (Signed)

## 2022-09-15 NOTE — Progress Notes (Signed)
Patient presents today for chemotherapy injection. Patient is in satisfactory condition with no new complaints voiced.  Vital signs are stable.  Labs reviewed by Dr. Ellin Saba during the office visit and all labs are within treatment parameters.  We will proceed with treatment per MD orders.

## 2022-09-15 NOTE — Progress Notes (Signed)
Patient is taking Revlimid as prescribed.  She has not missed any doses and reports no side effects at this time.    Patient has been examined by Dr. Katragadda. Vital signs and labs have been reviewed by MD - ANC, Creatinine, LFTs, hemoglobin, and platelets are within treatment parameters per M.D. - pt may proceed with treatment.  Primary RN and pharmacy notified.  

## 2022-09-15 NOTE — Patient Instructions (Signed)
[facility]  Discharge Instructions: Thank you for choosing [facility] to provide your oncology and hematology care.  If you have a lab appointment with the Cancer Center - please note that after April 8th, 2024, all labs will be drawn in the cancer center.  You do not have to check in or register with the main entrance as you have in the past but will complete your check-in in the cancer center.  Wear comfortable clothing and clothing appropriate for easy access to any Portacath or PICC line.   We strive to give you quality time with your provider. You may need to reschedule your appointment if you arrive late (15 or more minutes).  Arriving late affects you and other patients whose appointments are after yours.  Also, if you miss three or more appointments without notifying the office, you may be dismissed from the clinic at the provider's discretion.      For prescription refill requests, have your pharmacy contact our office and allow 72 hours for refills to be completed.    Today you received the following chemotherapy and/or immunotherapy agents Daratumumab.  Daratumumab; Hyaluronidase Injection What is this medication? DARATUMUMAB; HYALURONIDASE (dar a toom ue mab; hye al ur ON i dase) treats multiple myeloma, a type of bone marrow cancer. Daratumumab works by blocking a protein that causes cancer cells to grow and multiply. This helps to slow or stop the spread of cancer cells. Hyaluronidase works by increasing the absorption of other medications in the body to help them work better. This medication may also be used treat amyloidosis, a condition that causes the buildup of a protein (amyloid) in your body. It works by reducing the buildup of this protein, which decreases symptoms. It is a combination medication that contains a monoclonal antibody. This medicine may be used for other purposes; ask your health care provider or pharmacist if you have  questions. COMMON BRAND NAME(S): DARZALEX FASPRO What should I tell my care team before I take this medication? They need to know if you have any of these conditions: Heart disease Infection, such as chickenpox, cold sores, herpes, hepatitis B Lung or breathing disease An unusual or allergic reaction to daratumumab, hyaluronidase, other medications, foods, dyes, or preservatives Pregnant or trying to get pregnant Breast-feeding How should I use this medication? This medication is injected under the skin. It is given by your care team in a hospital or clinic setting. Talk to your care team about the use of this medication in children. Special care may be needed. Overdosage: If you think you have taken too much of this medicine contact a poison control center or emergency room at once. NOTE: This medicine is only for you. Do not share this medicine with others. What if I miss a dose? Keep appointments for follow-up doses. It is important not to miss your dose. Call your care team if you are unable to keep an appointment. What may interact with this medication? Interactions have not been studied. This list may not describe all possible interactions. Give your health care provider a list of all the medicines, herbs, non-prescription drugs, or dietary supplements you use. Also tell them if you smoke, drink alcohol, or use illegal drugs. Some items may interact with your medicine. What should I watch for while using this medication? Your condition will be monitored carefully while you are receiving this medication. This medication can cause serious allergic reactions. To reduce your risk, your care team may   give you other medication to take before receiving this one. Be sure to follow the directions from your care team. This medication can affect the results of blood tests to match your blood type. These changes can last for up to 6 months after the final dose. Your care team will do blood tests to  match your blood type before you start treatment. Tell all of your care team that you are being treated with this medication before receiving a blood transfusion. This medication can affect the results of some tests used to determine treatment response; extra tests may be needed to evaluate response. Talk to your care team if you wish to become pregnant or think you are pregnant. This medication can cause serious birth defects if taken during pregnancy and for 3 months after the last dose. A reliable form of contraception is recommended while taking this medication and for 3 months after the last dose. Talk to your care team about effective forms of contraception. Do not breast-feed while taking this medication. What side effects may I notice from receiving this medication? Side effects that you should report to your care team as soon as possible: Allergic reactions--skin rash, itching, hives, swelling of the face, lips, tongue, or throat Heart rhythm changes--fast or irregular heartbeat, dizziness, feeling faint or lightheaded, chest pain, trouble breathing Infection--fever, chills, cough, sore throat, wounds that don't heal, pain or trouble when passing urine, general feeling of discomfort or being unwell Infusion reactions--chest pain, shortness of breath or trouble breathing, feeling faint or lightheaded Sudden eye pain or change in vision such as blurry vision, seeing halos around lights, vision loss Unusual bruising or bleeding Side effects that usually do not require medical attention (report to your care team if they continue or are bothersome): Constipation Diarrhea Fatigue Nausea Pain, tingling, or numbness in the hands or feet Swelling of the ankles, hands, or feet This list may not describe all possible side effects. Call your doctor for medical advice about side effects. You may report side effects to FDA at 1-800-FDA-1088. Where should I keep my medication? This medication is given  in a hospital or clinic. It will not be stored at home. NOTE: This sheet is a summary. It may not cover all possible information. If you have questions about this medicine, talk to your doctor, pharmacist, or health care provider.  2024 Elsevier/Gold Standard (2021-06-16 00:00:00)        To help prevent nausea and vomiting after your treatment, we encourage you to take your nausea medication as directed.  BELOW ARE SYMPTOMS THAT SHOULD BE REPORTED IMMEDIATELY: *FEVER GREATER THAN 100.4 F (38 C) OR HIGHER *CHILLS OR SWEATING *NAUSEA AND VOMITING THAT IS NOT CONTROLLED WITH YOUR NAUSEA MEDICATION *UNUSUAL SHORTNESS OF BREATH *UNUSUAL BRUISING OR BLEEDING *URINARY PROBLEMS (pain or burning when urinating, or frequent urination) *BOWEL PROBLEMS (unusual diarrhea, constipation, pain near the anus) TENDERNESS IN MOUTH AND THROAT WITH OR WITHOUT PRESENCE OF ULCERS (sore throat, sores in mouth, or a toothache) UNUSUAL RASH, SWELLING OR PAIN  UNUSUAL VAGINAL DISCHARGE OR ITCHING   Items with * indicate a potential emergency and should be followed up as soon as possible or go to the Emergency Department if any problems should occur.  Please show the CHEMOTHERAPY ALERT CARD or IMMUNOTHERAPY ALERT CARD at check-in to the Emergency Department and triage nurse.  Should you have questions after your visit or need to cancel or reschedule your appointment, please contact [facility] [phone]  and follow   the prompts.  Office hours are 8:00 a.m. to 4:30 p.m. Monday - Friday. Please note that voicemails left after 4:00 p.m. may not be returned until the following business day.  We are closed weekends and major holidays. You have access to a nurse at all times for urgent questions. Please call the main number to the clinic [phone] and follow the prompts.  For any non-urgent questions, you may also contact your provider using MyChart. We now offer e-Visits for anyone 18 and older  to request care online for non-urgent symptoms. For details visit [email or web address].   Also download the MyChart app! Go to the app store, search "MyChart", open the app, select , and log in with your MyChart username and password.   

## 2022-09-16 ENCOUNTER — Other Ambulatory Visit: Payer: Self-pay

## 2022-09-16 LAB — KAPPA/LAMBDA LIGHT CHAINS
Kappa free light chain: 51.4 mg/L — ABNORMAL HIGH (ref 3.3–19.4)
Lambda free light chains: 28.7 mg/L — ABNORMAL HIGH (ref 5.7–26.3)

## 2022-09-17 ENCOUNTER — Inpatient Hospital Stay: Payer: Medicare Other

## 2022-09-17 VITALS — BP 100/41 | HR 58 | Temp 97.0°F | Resp 17

## 2022-09-17 DIAGNOSIS — D702 Other drug-induced agranulocytosis: Secondary | ICD-10-CM

## 2022-09-17 DIAGNOSIS — D509 Iron deficiency anemia, unspecified: Secondary | ICD-10-CM | POA: Diagnosis not present

## 2022-09-17 DIAGNOSIS — E876 Hypokalemia: Secondary | ICD-10-CM | POA: Diagnosis not present

## 2022-09-17 DIAGNOSIS — D631 Anemia in chronic kidney disease: Secondary | ICD-10-CM | POA: Diagnosis not present

## 2022-09-17 DIAGNOSIS — N189 Chronic kidney disease, unspecified: Secondary | ICD-10-CM | POA: Diagnosis not present

## 2022-09-17 DIAGNOSIS — C9000 Multiple myeloma not having achieved remission: Secondary | ICD-10-CM | POA: Diagnosis not present

## 2022-09-17 DIAGNOSIS — Z5112 Encounter for antineoplastic immunotherapy: Secondary | ICD-10-CM | POA: Diagnosis not present

## 2022-09-17 DIAGNOSIS — Z95828 Presence of other vascular implants and grafts: Secondary | ICD-10-CM

## 2022-09-17 DIAGNOSIS — D75839 Thrombocytosis, unspecified: Secondary | ICD-10-CM | POA: Diagnosis not present

## 2022-09-17 MED ORDER — DEXAMETHASONE 4 MG PO TABS: ORAL_TABLET | ORAL | 6 refills | Status: DC

## 2022-09-17 MED ORDER — CETIRIZINE HCL 10 MG PO TABS
10.0000 mg | ORAL_TABLET | Freq: Once | ORAL | Status: AC
  Administered 2022-09-17: 10 mg via ORAL
  Filled 2022-09-17: qty 1

## 2022-09-17 MED ORDER — LIDOCAINE-PRILOCAINE 2.5-2.5 % EX CREA: 1.0000 | TOPICAL_CREAM | CUTANEOUS | 1 refills | Status: DC | PRN

## 2022-09-17 MED ORDER — ACETAMINOPHEN 325 MG PO TABS
650.0000 mg | ORAL_TABLET | Freq: Once | ORAL | Status: AC
  Administered 2022-09-17: 650 mg via ORAL
  Filled 2022-09-17: qty 2

## 2022-09-17 MED ORDER — SODIUM CHLORIDE 0.9 % IV SOLN: Freq: Once | INTRAVENOUS | Status: AC

## 2022-09-17 MED ORDER — SODIUM CHLORIDE 0.9 % IV SOLN: 510.0000 mg | Freq: Once | INTRAVENOUS | Status: DC

## 2022-09-17 MED ORDER — HEPARIN SOD (PORK) LOCK FLUSH 100 UNIT/ML IV SOLN
500.0000 [IU] | Freq: Once | INTRAVENOUS | Status: AC | PRN
  Administered 2022-09-17: 500 [IU]

## 2022-09-17 MED ORDER — SODIUM CHLORIDE 0.9% FLUSH
10.0000 mL | Freq: Once | INTRAVENOUS | Status: AC | PRN
  Administered 2022-09-17: 10 mL

## 2022-09-17 MED ORDER — SODIUM CHLORIDE 0.9 % IV SOLN
510.0000 mg | Freq: Once | INTRAVENOUS | Status: AC
  Administered 2022-09-17: 510 mg via INTRAVENOUS
  Filled 2022-09-17: qty 510

## 2022-09-17 NOTE — Patient Instructions (Signed)
[facility]  Discharge Instructions: Thank you for choosing [facility] to provide your oncology and hematology care.  If you have a lab appointment with the Cancer Center - please note that after April 8th, 2024, all labs will be drawn in the cancer center.  You do not have to check in or register with the main entrance as you have in the past but will complete your check-in in the cancer center.  Wear comfortable clothing and clothing appropriate for easy access to any Portacath or PICC line.   We strive to give you quality time with your provider. You may need to reschedule your appointment if you arrive late (15 or more minutes).  Arriving late affects you and other patients whose appointments are after yours.  Also, if you miss three or more appointments without notifying the office, you may be dismissed from the clinic at the provider's discretion.      For prescription refill requests, have your pharmacy contact our office and allow 72 hours for refills to be completed.    Today you received the following chemotherapy and/or immunotherapy agents Feraheme. Ferumoxytol Injection What is this medication? FERUMOXYTOL (FER ue MOX i tol) treats low levels of iron in your body (iron deficiency anemia). Iron is a mineral that plays an important role in making red blood cells, which carry oxygen from your lungs to the rest of your body. This medicine may be used for other purposes; ask your health care provider or pharmacist if you have questions. COMMON BRAND NAME(S): Feraheme What should I tell my care team before I take this medication? They need to know if you have any of these conditions: Anemia not caused by low iron levels High levels of iron in the blood Magnetic resonance imaging (MRI) test scheduled An unusual or allergic reaction to iron, other medications, foods, dyes, or preservatives Pregnant or trying to get pregnant Breastfeeding How should I  use this medication? This medication is injected into a vein. It is given by your care team in a hospital or clinic setting. Talk to your care team the use of this medication in children. Special care may be needed. Overdosage: If you think you have taken too much of this medicine contact a poison control center or emergency room at once. NOTE: This medicine is only for you. Do not share this medicine with others. What if I miss a dose? It is important not to miss your dose. Call your care team if you are unable to keep an appointment. What may interact with this medication? Other iron products This list may not describe all possible interactions. Give your health care provider a list of all the medicines, herbs, non-prescription drugs, or dietary supplements you use. Also tell them if you smoke, drink alcohol, or use illegal drugs. Some items may interact with your medicine. What should I watch for while using this medication? Visit your care team regularly. Tell your care team if your symptoms do not start to get better or if they get worse. You may need blood work done while you are taking this medication. You may need to follow a special diet. Talk to your care team. Foods that contain iron include: whole grains/cereals, dried fruits, beans, or peas, leafy green vegetables, and organ meats (liver, kidney). What side effects may I notice from receiving this medication? Side effects that you should report to your care team as soon as possible: Allergic reactions--skin rash, itching, hives, swelling  of the face, lips, tongue, or throat Low blood pressure--dizziness, feeling faint or lightheaded, blurry vision Shortness of breath Side effects that usually do not require medical attention (report to your care team if they continue or are bothersome): Flushing Headache Joint pain Muscle pain Nausea Pain, redness, or irritation at injection site This list may not describe all possible side  effects. Call your doctor for medical advice about side effects. You may report side effects to FDA at 1-800-FDA-1088. Where should I keep my medication? This medication is given in a hospital or clinic. It will not be stored at home. NOTE: This sheet is a summary. It may not cover all possible information. If you have questions about this medicine, talk to your doctor, pharmacist, or health care provider.  2024 Elsevier/Gold Standard (2022-07-16 00:00:00)       To help prevent nausea and vomiting after your treatment, we encourage you to take your nausea medication as directed.  BELOW ARE SYMPTOMS THAT SHOULD BE REPORTED IMMEDIATELY: *FEVER GREATER THAN 100.4 F (38 C) OR HIGHER *CHILLS OR SWEATING *NAUSEA AND VOMITING THAT IS NOT CONTROLLED WITH YOUR NAUSEA MEDICATION *UNUSUAL SHORTNESS OF BREATH *UNUSUAL BRUISING OR BLEEDING *URINARY PROBLEMS (pain or burning when urinating, or frequent urination) *BOWEL PROBLEMS (unusual diarrhea, constipation, pain near the anus) TENDERNESS IN MOUTH AND THROAT WITH OR WITHOUT PRESENCE OF ULCERS (sore throat, sores in mouth, or a toothache) UNUSUAL RASH, SWELLING OR PAIN  UNUSUAL VAGINAL DISCHARGE OR ITCHING   Items with * indicate a potential emergency and should be followed up as soon as possible or go to the Emergency Department if any problems should occur.  Please show the CHEMOTHERAPY ALERT CARD or IMMUNOTHERAPY ALERT CARD at check-in to the Emergency Department and triage nurse.  Should you have questions after your visit or need to cancel or reschedule your appointment, please contact [facility] [phone]  and follow the prompts.  Office hours are 8:00 a.m. to 4:30 p.m. Monday - Friday. Please note that voicemails left after 4:00 p.m. may not be returned until the following business day.  We are closed weekends and major holidays. You have access to a nurse at all times for urgent questions. Please call the main number  to the clinic [phone] and follow the prompts.  For any non-urgent questions, you may also contact your provider using MyChart. We now offer e-Visits for anyone 44 and older to request care online for non-urgent symptoms. For details visit [email or web address].   Also download the MyChart app! Go to the app store, search "MyChart", open the app, select [facility], and log in with your MyChart username and password.

## 2022-09-17 NOTE — Progress Notes (Signed)
Patient presents today for iron infusion of Feraheme.  Patient is in satisfactory condition with no new complaints voiced.  Vital signs are stable.  We will proceed with infusion per provider orders.

## 2022-09-17 NOTE — Progress Notes (Signed)
Feraheme given today per MD orders. Tolerated infusion without adverse affects. Blood pressure 100/41 at discharge. Patient asymptomatic. Message sent to R. Pennington PA pertaining to blood pressure. Orders received to discharge home and follow up with PCP on Monday. Patient instructed to NOT take her afternoon blood pressure medications. Patient instructed to check blood pressure over the weekend and son at the bedside states they have access to a blood pressure cuff. Per R. Pennington PA, instruct patient if BP is lower than 120/70 hold blood pressure medications. Understanding verbalized by patient and son.  No complaints at this time. Discharged from clinic by wheel chair in stable condition. Alert and oriented x 3. F/U with [facility] as scheduled.

## 2022-09-20 ENCOUNTER — Other Ambulatory Visit: Payer: Self-pay

## 2022-09-20 MED ORDER — LENALIDOMIDE 15 MG PO CAPS: 15.0000 mg | ORAL_CAPSULE | Freq: Every day | ORAL | 0 refills | Status: DC

## 2022-09-20 NOTE — Telephone Encounter (Signed)
Chart reviewed. Revlimid refilled per last office note with Dr. Katragadda.  

## 2022-09-21 DIAGNOSIS — K080 Exfoliation of teeth due to systemic causes: Secondary | ICD-10-CM | POA: Diagnosis not present

## 2022-09-23 MED FILL — Ferumoxytol Inj 510 MG/17ML (30 MG/ML) (Elemental Fe): INTRAVENOUS | Qty: 17 | Status: AC

## 2022-09-24 ENCOUNTER — Inpatient Hospital Stay: Payer: Medicare Other | Attending: Hematology

## 2022-09-24 VITALS — BP 109/50 | HR 56 | Temp 97.5°F | Resp 17

## 2022-09-24 DIAGNOSIS — Z79899 Other long term (current) drug therapy: Secondary | ICD-10-CM | POA: Insufficient documentation

## 2022-09-24 DIAGNOSIS — D509 Iron deficiency anemia, unspecified: Secondary | ICD-10-CM | POA: Diagnosis not present

## 2022-09-24 DIAGNOSIS — D702 Other drug-induced agranulocytosis: Secondary | ICD-10-CM

## 2022-09-24 DIAGNOSIS — C9000 Multiple myeloma not having achieved remission: Secondary | ICD-10-CM | POA: Diagnosis not present

## 2022-09-24 DIAGNOSIS — Z5112 Encounter for antineoplastic immunotherapy: Secondary | ICD-10-CM | POA: Insufficient documentation

## 2022-09-24 MED ORDER — ACETAMINOPHEN 325 MG PO TABS
650.0000 mg | ORAL_TABLET | Freq: Once | ORAL | Status: AC
  Administered 2022-09-24: 650 mg via ORAL
  Filled 2022-09-24: qty 2

## 2022-09-24 MED ORDER — SODIUM CHLORIDE 0.9% FLUSH
10.0000 mL | Freq: Once | INTRAVENOUS | Status: AC
  Administered 2022-09-24: 10 mL via INTRAVENOUS

## 2022-09-24 MED ORDER — SODIUM CHLORIDE 0.9 % IV SOLN
510.0000 mg | Freq: Once | INTRAVENOUS | Status: AC
  Administered 2022-09-24: 510 mg via INTRAVENOUS
  Filled 2022-09-24: qty 510

## 2022-09-24 MED ORDER — HEPARIN SOD (PORK) LOCK FLUSH 100 UNIT/ML IV SOLN
500.0000 [IU] | Freq: Once | INTRAVENOUS | Status: AC
  Administered 2022-09-24: 500 [IU] via INTRAVENOUS

## 2022-09-24 MED ORDER — CETIRIZINE HCL 10 MG PO TABS
10.0000 mg | ORAL_TABLET | Freq: Once | ORAL | Status: AC
  Administered 2022-09-24: 10 mg via ORAL
  Filled 2022-09-24: qty 1

## 2022-09-24 MED ORDER — SODIUM CHLORIDE 0.9 % IV SOLN: Freq: Once | INTRAVENOUS | Status: AC

## 2022-09-24 NOTE — Progress Notes (Signed)
Patient presents today for Feraheme infusion per providers order.  Vital signs WNL.  Patient has no new complaints at this time.  Stable during infusion without adverse affects.  Vital signs stable.  No complaints at this time.  Discharge from clinic ambulatory in stable condition.  Alert and oriented X 3.  Follow up with [facility] as scheduled.  

## 2022-10-05 DIAGNOSIS — K080 Exfoliation of teeth due to systemic causes: Secondary | ICD-10-CM | POA: Diagnosis not present

## 2022-10-09 DIAGNOSIS — I5032 Chronic diastolic (congestive) heart failure: Secondary | ICD-10-CM | POA: Diagnosis not present

## 2022-10-09 DIAGNOSIS — C9002 Multiple myeloma in relapse: Secondary | ICD-10-CM | POA: Diagnosis not present

## 2022-10-13 ENCOUNTER — Inpatient Hospital Stay: Payer: Medicare Other

## 2022-10-13 ENCOUNTER — Inpatient Hospital Stay: Payer: Medicare Other | Admitting: Hematology

## 2022-10-13 VITALS — BP 122/54 | HR 60 | Temp 96.9°F | Resp 18 | Wt 123.2 lb

## 2022-10-13 DIAGNOSIS — D509 Iron deficiency anemia, unspecified: Secondary | ICD-10-CM | POA: Diagnosis not present

## 2022-10-13 DIAGNOSIS — C9000 Multiple myeloma not having achieved remission: Secondary | ICD-10-CM | POA: Diagnosis not present

## 2022-10-13 DIAGNOSIS — Z5112 Encounter for antineoplastic immunotherapy: Secondary | ICD-10-CM | POA: Diagnosis not present

## 2022-10-13 DIAGNOSIS — Z79899 Other long term (current) drug therapy: Secondary | ICD-10-CM | POA: Diagnosis not present

## 2022-10-13 DIAGNOSIS — Z95828 Presence of other vascular implants and grafts: Secondary | ICD-10-CM

## 2022-10-13 LAB — CBC WITH DIFFERENTIAL/PLATELET
Abs Immature Granulocytes: 0.05 10*3/uL (ref 0.00–0.07)
Basophils Absolute: 0.1 10*3/uL (ref 0.0–0.1)
Basophils Relative: 1 %
Eosinophils Absolute: 0 10*3/uL (ref 0.0–0.5)
Eosinophils Relative: 1 %
HCT: 35.6 % — ABNORMAL LOW (ref 36.0–46.0)
Hemoglobin: 11.1 g/dL — ABNORMAL LOW (ref 12.0–15.0)
Immature Granulocytes: 1 %
Lymphocytes Relative: 20 %
Lymphs Abs: 1.3 10*3/uL (ref 0.7–4.0)
MCH: 29.2 pg (ref 26.0–34.0)
MCHC: 31.2 g/dL (ref 30.0–36.0)
MCV: 93.7 fL (ref 80.0–100.0)
Monocytes Absolute: 0.4 10*3/uL (ref 0.1–1.0)
Monocytes Relative: 6 %
Neutro Abs: 4.8 10*3/uL (ref 1.7–7.7)
Neutrophils Relative %: 71 %
Platelets: 276 10*3/uL (ref 150–400)
RBC: 3.8 MIL/uL — ABNORMAL LOW (ref 3.87–5.11)
RDW: 28.8 % — ABNORMAL HIGH (ref 11.5–15.5)
WBC: 6.7 10*3/uL (ref 4.0–10.5)
nRBC: 0.3 % — ABNORMAL HIGH (ref 0.0–0.2)

## 2022-10-13 MED ORDER — HEPARIN SOD (PORK) LOCK FLUSH 100 UNIT/ML IV SOLN
500.0000 [IU] | Freq: Once | INTRAVENOUS | Status: AC
  Administered 2022-10-13: 500 [IU] via INTRAVENOUS

## 2022-10-13 MED ORDER — CETIRIZINE HCL 10 MG PO TABS
10.0000 mg | ORAL_TABLET | Freq: Once | ORAL | Status: AC
  Administered 2022-10-13: 10 mg via ORAL
  Filled 2022-10-13: qty 1

## 2022-10-13 MED ORDER — SODIUM CHLORIDE 0.9% FLUSH
10.0000 mL | INTRAVENOUS | Status: DC | PRN
  Administered 2022-10-13: 10 mL via INTRAVENOUS

## 2022-10-13 MED ORDER — DARATUMUMAB-HYALURONIDASE-FIHJ 1800-30000 MG-UT/15ML ~~LOC~~ SOLN
1800.0000 mg | Freq: Once | SUBCUTANEOUS | Status: AC
  Administered 2022-10-13: 1800 mg via SUBCUTANEOUS
  Filled 2022-10-13: qty 15

## 2022-10-13 MED ORDER — ACETAMINOPHEN 325 MG PO TABS
650.0000 mg | ORAL_TABLET | Freq: Once | ORAL | Status: AC
  Administered 2022-10-13: 650 mg via ORAL
  Filled 2022-10-13: qty 2

## 2022-10-13 NOTE — Progress Notes (Signed)
Patients port flushed without difficulty.  Good blood return noted with no bruising or swelling noted at site. Patient remains accessed.  

## 2022-10-13 NOTE — Progress Notes (Signed)
Patient presents today for Darzalex Faspro injection. Vital signs and labs within parameters for treatment. Patient has no complaints of any side effects related to her treatment. MAR reviewed and updated. Patient taking Revlimid as prescribed and has no complaints of any side effects.   Treatment given today per MD orders. Tolerated without adverse affects. Vital signs stable. No complaints at this time. Discharged from clinic by wheel chair in stable condition. Alert and oriented x 3. F/U with [facility] as scheduled.

## 2022-10-13 NOTE — Patient Instructions (Signed)
 [facility]  Discharge Instructions: Thank you for choosing [facility] to provide your oncology and hematology care.  If you have a lab appointment with the Cancer Center - please note that after April 8th, 2024, all labs will be drawn in the cancer center.  You do not have to check in or register with the main entrance as you have in the past but will complete your check-in in the cancer center.  Wear comfortable clothing and clothing appropriate for easy access to any Portacath or PICC line.   We strive to give you quality time with your provider. You may need to reschedule your appointment if you arrive late (15 or more minutes).  Arriving late affects you and other patients whose appointments are after yours.  Also, if you miss three or more appointments without notifying the office, you may be dismissed from the clinic at the provider's discretion.      For prescription refill requests, have your pharmacy contact our office and allow 72 hours for refills to be completed.    Today you received the following chemotherapy and/or immunotherapy agents Darzalex Faspro.  Daratumumab; Hyaluronidase Injection What is this medication? DARATUMUMAB; HYALURONIDASE (dar a toom ue mab; hye al ur ON i dase) treats multiple myeloma, a type of bone marrow cancer. Daratumumab works by blocking a protein that causes cancer cells to grow and multiply. This helps to slow or stop the spread of cancer cells. Hyaluronidase works by increasing the absorption of other medications in the body to help them work better. This medication may also be used treat amyloidosis, a condition that causes the buildup of a protein (amyloid) in your body. It works by reducing the buildup of this protein, which decreases symptoms. It is a combination medication that contains a monoclonal antibody. This medicine may be used for other purposes; ask your health care provider or pharmacist if you have  questions. COMMON BRAND NAME(S): DARZALEX FASPRO What should I tell my care team before I take this medication? They need to know if you have any of these conditions: Heart disease Infection, such as chickenpox, cold sores, herpes, hepatitis B Lung or breathing disease An unusual or allergic reaction to daratumumab, hyaluronidase, other medications, foods, dyes, or preservatives Pregnant or trying to get pregnant Breast-feeding How should I use this medication? This medication is injected under the skin. It is given by your care team in a hospital or clinic setting. Talk to your care team about the use of this medication in children. Special care may be needed. Overdosage: If you think you have taken too much of this medicine contact a poison control center or emergency room at once. NOTE: This medicine is only for you. Do not share this medicine with others. What if I miss a dose? Keep appointments for follow-up doses. It is important not to miss your dose. Call your care team if you are unable to keep an appointment. What may interact with this medication? Interactions have not been studied. This list may not describe all possible interactions. Give your health care provider a list of all the medicines, herbs, non-prescription drugs, or dietary supplements you use. Also tell them if you smoke, drink alcohol, or use illegal drugs. Some items may interact with your medicine. What should I watch for while using this medication? Your condition will be monitored carefully while you are receiving this medication. This medication can cause serious allergic reactions. To reduce your risk, your care team  may give you other medication to take before receiving this one. Be sure to follow the directions from your care team. This medication can affect the results of blood tests to match your blood type. These changes can last for up to 6 months after the final dose. Your care team will do blood tests to  match your blood type before you start treatment. Tell all of your care team that you are being treated with this medication before receiving a blood transfusion. This medication can affect the results of some tests used to determine treatment response; extra tests may be needed to evaluate response. Talk to your care team if you wish to become pregnant or think you are pregnant. This medication can cause serious birth defects if taken during pregnancy and for 3 months after the last dose. A reliable form of contraception is recommended while taking this medication and for 3 months after the last dose. Talk to your care team about effective forms of contraception. Do not breast-feed while taking this medication. What side effects may I notice from receiving this medication? Side effects that you should report to your care team as soon as possible: Allergic reactions--skin rash, itching, hives, swelling of the face, lips, tongue, or throat Heart rhythm changes--fast or irregular heartbeat, dizziness, feeling faint or lightheaded, chest pain, trouble breathing Infection--fever, chills, cough, sore throat, wounds that don't heal, pain or trouble when passing urine, general feeling of discomfort or being unwell Infusion reactions--chest pain, shortness of breath or trouble breathing, feeling faint or lightheaded Sudden eye pain or change in vision such as blurry vision, seeing halos around lights, vision loss Unusual bruising or bleeding Side effects that usually do not require medical attention (report to your care team if they continue or are bothersome): Constipation Diarrhea Fatigue Nausea Pain, tingling, or numbness in the hands or feet Swelling of the ankles, hands, or feet This list may not describe all possible side effects. Call your doctor for medical advice about side effects. You may report side effects to FDA at 1-800-FDA-1088. Where should I keep my medication? This medication is given  in a hospital or clinic. It will not be stored at home. NOTE: This sheet is a summary. It may not cover all possible information. If you have questions about this medicine, talk to your doctor, pharmacist, or health care provider.  2024 Elsevier/Gold Standard (2021-06-16 00:00:00)       To help prevent nausea and vomiting after your treatment, we encourage you to take your nausea medication as directed.  BELOW ARE SYMPTOMS THAT SHOULD BE REPORTED IMMEDIATELY: *FEVER GREATER THAN 100.4 F (38 C) OR HIGHER *CHILLS OR SWEATING *NAUSEA AND VOMITING THAT IS NOT CONTROLLED WITH YOUR NAUSEA MEDICATION *UNUSUAL SHORTNESS OF BREATH *UNUSUAL BRUISING OR BLEEDING *URINARY PROBLEMS (pain or burning when urinating, or frequent urination) *BOWEL PROBLEMS (unusual diarrhea, constipation, pain near the anus) TENDERNESS IN MOUTH AND THROAT WITH OR WITHOUT PRESENCE OF ULCERS (sore throat, sores in mouth, or a toothache) UNUSUAL RASH, SWELLING OR PAIN  UNUSUAL VAGINAL DISCHARGE OR ITCHING   Items with * indicate a potential emergency and should be followed up as soon as possible or go to the Emergency Department if any problems should occur.  Please show the CHEMOTHERAPY ALERT CARD or IMMUNOTHERAPY ALERT CARD at check-in to the Emergency Department and triage nurse.  Should you have questions after your visit or need to cancel or reschedule your appointment, please contact [facility] AT [facility] [phone]  and follow  the prompts.  Office hours are 8:00 a.m. to 4:30 p.m. Monday - Friday. Please note that voicemails left after 4:00 p.m. may not be returned until the following business day.  We are closed weekends and major holidays. You have access to a nurse at all times for urgent questions. Please call the main number to the clinic [phone] and follow the prompts.  For any non-urgent questions, you may also contact your provider using MyChart. We now offer e-Visits for anyone 52 and older  to request care online for non-urgent symptoms. For details visit [email or web address].   Also download the MyChart app! Go to the app store, search "MyChart", open the app, select [place], and log in with your MyChart username and password.

## 2022-10-19 ENCOUNTER — Other Ambulatory Visit: Payer: Self-pay

## 2022-10-19 MED ORDER — LENALIDOMIDE 15 MG PO CAPS: 15.0000 mg | ORAL_CAPSULE | Freq: Every day | ORAL | 0 refills | Status: DC

## 2022-10-19 NOTE — Telephone Encounter (Signed)
Chart reviewed. Revlimid refilled per last office note with Dr. Katragadda.  

## 2022-10-22 ENCOUNTER — Other Ambulatory Visit: Payer: Self-pay

## 2022-11-09 DIAGNOSIS — C9002 Multiple myeloma in relapse: Secondary | ICD-10-CM | POA: Diagnosis not present

## 2022-11-09 DIAGNOSIS — I5032 Chronic diastolic (congestive) heart failure: Secondary | ICD-10-CM | POA: Diagnosis not present

## 2022-11-10 ENCOUNTER — Inpatient Hospital Stay: Payer: Medicare Other | Attending: Hematology

## 2022-11-10 ENCOUNTER — Inpatient Hospital Stay: Payer: Medicare Other

## 2022-11-10 DIAGNOSIS — C9000 Multiple myeloma not having achieved remission: Secondary | ICD-10-CM | POA: Insufficient documentation

## 2022-11-10 DIAGNOSIS — Z5112 Encounter for antineoplastic immunotherapy: Secondary | ICD-10-CM | POA: Diagnosis not present

## 2022-11-10 LAB — CBC WITH DIFFERENTIAL/PLATELET
Abs Immature Granulocytes: 0.04 10*3/uL (ref 0.00–0.07)
Basophils Absolute: 0 10*3/uL (ref 0.0–0.1)
Basophils Relative: 1 %
Eosinophils Absolute: 0 10*3/uL (ref 0.0–0.5)
Eosinophils Relative: 0 %
HCT: 35.4 % — ABNORMAL LOW (ref 36.0–46.0)
Hemoglobin: 11.3 g/dL — ABNORMAL LOW (ref 12.0–15.0)
Immature Granulocytes: 1 %
Lymphocytes Relative: 14 %
Lymphs Abs: 0.9 10*3/uL (ref 0.7–4.0)
MCH: 30.6 pg (ref 26.0–34.0)
MCHC: 31.9 g/dL (ref 30.0–36.0)
MCV: 95.9 fL (ref 80.0–100.0)
Monocytes Absolute: 0.2 10*3/uL (ref 0.1–1.0)
Monocytes Relative: 3 %
Neutro Abs: 5.5 10*3/uL (ref 1.7–7.7)
Neutrophils Relative %: 81 %
Platelets: 307 10*3/uL (ref 150–400)
RBC: 3.69 MIL/uL — ABNORMAL LOW (ref 3.87–5.11)
RDW: 26 % — ABNORMAL HIGH (ref 11.5–15.5)
WBC: 6.6 10*3/uL (ref 4.0–10.5)
nRBC: 0 % (ref 0.0–0.2)

## 2022-11-10 LAB — COMPREHENSIVE METABOLIC PANEL WITH GFR
ALT: 13 U/L (ref 0–44)
AST: 14 U/L — ABNORMAL LOW (ref 15–41)
Albumin: 3 g/dL — ABNORMAL LOW (ref 3.5–5.0)
Alkaline Phosphatase: 50 U/L (ref 38–126)
Anion gap: 9 (ref 5–15)
BUN: 19 mg/dL (ref 8–23)
CO2: 22 mmol/L (ref 22–32)
Calcium: 8.6 mg/dL — ABNORMAL LOW (ref 8.9–10.3)
Chloride: 108 mmol/L (ref 98–111)
Creatinine, Ser: 1.33 mg/dL — ABNORMAL HIGH (ref 0.44–1.00)
GFR, Estimated: 38 mL/min — ABNORMAL LOW (ref >60)
Glucose, Bld: 151 mg/dL — ABNORMAL HIGH (ref 70–99)
Potassium: 3.2 mmol/L — ABNORMAL LOW (ref 3.5–5.1)
Sodium: 139 mmol/L (ref 135–145)
Total Bilirubin: 0.9 mg/dL (ref 0.3–1.2)
Total Protein: 5.4 g/dL — ABNORMAL LOW (ref 6.5–8.1)

## 2022-11-10 MED ORDER — HEPARIN SOD (PORK) LOCK FLUSH 100 UNIT/ML IV SOLN
500.0000 [IU] | Freq: Once | INTRAVENOUS | Status: AC
  Administered 2022-11-10: 500 [IU] via INTRAVENOUS

## 2022-11-10 MED ORDER — POTASSIUM CHLORIDE CRYS ER 20 MEQ PO TBCR
40.0000 meq | EXTENDED_RELEASE_TABLET | Freq: Once | ORAL | Status: AC
  Administered 2022-11-10: 40 meq via ORAL
  Filled 2022-11-10: qty 2

## 2022-11-10 MED ORDER — ACETAMINOPHEN 325 MG PO TABS
650.0000 mg | ORAL_TABLET | Freq: Once | ORAL | Status: AC
  Administered 2022-11-10: 650 mg via ORAL
  Filled 2022-11-10: qty 2

## 2022-11-10 MED ORDER — DARATUMUMAB-HYALURONIDASE-FIHJ 1800-30000 MG-UT/15ML ~~LOC~~ SOLN
1800.0000 mg | Freq: Once | SUBCUTANEOUS | Status: AC
  Administered 2022-11-10: 1800 mg via SUBCUTANEOUS
  Filled 2022-11-10: qty 15

## 2022-11-10 MED ORDER — SODIUM CHLORIDE 0.9% FLUSH
10.0000 mL | INTRAVENOUS | Status: DC | PRN
  Administered 2022-11-10: 10 mL via INTRAVENOUS

## 2022-11-10 MED ORDER — CETIRIZINE HCL 10 MG PO TABS
10.0000 mg | ORAL_TABLET | Freq: Once | ORAL | Status: AC
  Administered 2022-11-10: 10 mg via ORAL
  Filled 2022-11-10: qty 1

## 2022-11-10 NOTE — Patient Instructions (Signed)
[facility]  Discharge Instructions: Thank you for choosing [facility] to provide your oncology and hematology care.  If you have a lab appointment with the Cancer Center - please note that after April 8th, 2024, all labs will be drawn in the cancer center.  You do not have to check in or register with the main entrance as you have in the past but will complete your check-in in the cancer center.  Wear comfortable clothing and clothing appropriate for easy access to any Portacath or PICC line.   We strive to give you quality time with your provider. You may need to reschedule your appointment if you arrive late (15 or more minutes).  Arriving late affects you and other patients whose appointments are after yours.  Also, if you miss three or more appointments without notifying the office, you may be dismissed from the clinic at the provider's discretion.      For prescription refill requests, have your pharmacy contact our office and allow 72 hours for refills to be completed.    Today you received the following chemotherapy and/or immunotherapy agents Darzalex Faspro.  Daratumumab; Hyaluronidase Injection What is this medication? DARATUMUMAB; HYALURONIDASE (dar a toom ue mab; hye al ur ON i dase) treats multiple myeloma, a type of bone marrow cancer. Daratumumab works by blocking a protein that causes cancer cells to grow and multiply. This helps to slow or stop the spread of cancer cells. Hyaluronidase works by increasing the absorption of other medications in the body to help them work better. This medication may also be used treat amyloidosis, a condition that causes the buildup of a protein (amyloid) in your body. It works by reducing the buildup of this protein, which decreases symptoms. It is a combination medication that contains a monoclonal antibody. This medicine may be used for other purposes; ask your health care provider or pharmacist if you have  questions. COMMON BRAND NAME(S): DARZALEX FASPRO What should I tell my care team before I take this medication? They need to know if you have any of these conditions: Heart disease Infection, such as chickenpox, cold sores, herpes, hepatitis B Lung or breathing disease An unusual or allergic reaction to daratumumab, hyaluronidase, other medications, foods, dyes, or preservatives Pregnant or trying to get pregnant Breast-feeding How should I use this medication? This medication is injected under the skin. It is given by your care team in a hospital or clinic setting. Talk to your care team about the use of this medication in children. Special care may be needed. Overdosage: If you think you have taken too much of this medicine contact a poison control center or emergency room at once. NOTE: This medicine is only for you. Do not share this medicine with others. What if I miss a dose? Keep appointments for follow-up doses. It is important not to miss your dose. Call your care team if you are unable to keep an appointment. What may interact with this medication? Interactions have not been studied. This list may not describe all possible interactions. Give your health care provider a list of all the medicines, herbs, non-prescription drugs, or dietary supplements you use. Also tell them if you smoke, drink alcohol, or use illegal drugs. Some items may interact with your medicine. What should I watch for while using this medication? Your condition will be monitored carefully while you are receiving this medication. This medication can cause serious allergic reactions. To reduce your risk, your care team  may give you other medication to take before receiving this one. Be sure to follow the directions from your care team. This medication can affect the results of blood tests to match your blood type. These changes can last for up to 6 months after the final dose. Your care team will do blood tests to  match your blood type before you start treatment. Tell all of your care team that you are being treated with this medication before receiving a blood transfusion. This medication can affect the results of some tests used to determine treatment response; extra tests may be needed to evaluate response. Talk to your care team if you wish to become pregnant or think you are pregnant. This medication can cause serious birth defects if taken during pregnancy and for 3 months after the last dose. A reliable form of contraception is recommended while taking this medication and for 3 months after the last dose. Talk to your care team about effective forms of contraception. Do not breast-feed while taking this medication. What side effects may I notice from receiving this medication? Side effects that you should report to your care team as soon as possible: Allergic reactions--skin rash, itching, hives, swelling of the face, lips, tongue, or throat Heart rhythm changes--fast or irregular heartbeat, dizziness, feeling faint or lightheaded, chest pain, trouble breathing Infection--fever, chills, cough, sore throat, wounds that don't heal, pain or trouble when passing urine, general feeling of discomfort or being unwell Infusion reactions--chest pain, shortness of breath or trouble breathing, feeling faint or lightheaded Sudden eye pain or change in vision such as blurry vision, seeing halos around lights, vision loss Unusual bruising or bleeding Side effects that usually do not require medical attention (report to your care team if they continue or are bothersome): Constipation Diarrhea Fatigue Nausea Pain, tingling, or numbness in the hands or feet Swelling of the ankles, hands, or feet This list may not describe all possible side effects. Call your doctor for medical advice about side effects. You may report side effects to FDA at 1-800-FDA-1088. Where should I keep my medication? This medication is given  in a hospital or clinic. It will not be stored at home. NOTE: This sheet is a summary. It may not cover all possible information. If you have questions about this medicine, talk to your doctor, pharmacist, or health care provider.  2024 Elsevier/Gold Standard (2021-06-16 00:00:00)       To help prevent nausea and vomiting after your treatment, we encourage you to take your nausea medication as directed.  BELOW ARE SYMPTOMS THAT SHOULD BE REPORTED IMMEDIATELY: *FEVER GREATER THAN 100.4 F (38 C) OR HIGHER *CHILLS OR SWEATING *NAUSEA AND VOMITING THAT IS NOT CONTROLLED WITH YOUR NAUSEA MEDICATION *UNUSUAL SHORTNESS OF BREATH *UNUSUAL BRUISING OR BLEEDING *URINARY PROBLEMS (pain or burning when urinating, or frequent urination) *BOWEL PROBLEMS (unusual diarrhea, constipation, pain near the anus) TENDERNESS IN MOUTH AND THROAT WITH OR WITHOUT PRESENCE OF ULCERS (sore throat, sores in mouth, or a toothache) UNUSUAL RASH, SWELLING OR PAIN  UNUSUAL VAGINAL DISCHARGE OR ITCHING   Items with * indicate a potential emergency and should be followed up as soon as possible or go to the Emergency Department if any problems should occur.  Please show the CHEMOTHERAPY ALERT CARD or IMMUNOTHERAPY ALERT CARD at check-in to the Emergency Department and triage nurse.  Should you have questions after your visit or need to cancel or reschedule your appointment, please contact [facility] [phone]  and follow  the prompts.  Office hours are 8:00 a.m. to 4:30 p.m. Monday - Friday. Please note that voicemails left after 4:00 p.m. may not be returned until the following business day.  We are closed weekends and major holidays. You have access to a nurse at all times for urgent questions. Please call the main number to the clinic [phone] and follow the prompts.  For any non-urgent questions, you may also contact your provider using MyChart. We now offer e-Visits for anyone 39 and older  to request care online for non-urgent symptoms. For details visit [email or web address].   Also download the MyChart app! Go to the app store, search "MyChart", open the app, select [facility], and log in with your MyChart username and password.

## 2022-11-10 NOTE — Progress Notes (Signed)
Patient presents today for Darzalex Faspro injection. Labs pending. Vital signs within parameters for treatment. Potassium 3.2 today. Standing orders followed: KDUR 40 mEq PO x 1 dose today. Patient states she is taking Revlimid as prescribed and denies any side effects related to her chemo pill.   Darzalex Faspro given today per MD orders. Tolerated without adverse affects. Vital signs stable. No complaints at this time. Discharged from clinic by wheel chair in stable condition. Alert and oriented x 3. F/U with [facility] as scheduled.

## 2022-11-12 LAB — KAPPA/LAMBDA LIGHT CHAINS
Kappa free light chain: 53.5 mg/L — ABNORMAL HIGH (ref 3.3–19.4)
Kappa, lambda light chain ratio: 2.17 — ABNORMAL HIGH (ref 0.26–1.65)
Lambda free light chains: 24.7 mg/L (ref 5.7–26.3)

## 2022-11-14 LAB — IMMUNOFIXATION ELECTROPHORESIS
IgA: 255 mg/dL (ref 64–422)
IgG (Immunoglobin G), Serum: 400 mg/dL — ABNORMAL LOW (ref 586–1602)
IgM (Immunoglobulin M), Srm: 39 mg/dL (ref 26–217)
Total Protein ELP: 4.8 g/dL — ABNORMAL LOW (ref 6.0–8.5)

## 2022-11-15 LAB — PROTEIN ELECTROPHORESIS, SERUM
A/G Ratio: 1.4 (ref 0.7–1.7)
Albumin ELP: 2.9 g/dL (ref 2.9–4.4)
Alpha-1-Globulin: 0.3 g/dL (ref 0.0–0.4)
Alpha-2-Globulin: 0.6 g/dL (ref 0.4–1.0)
Beta Globulin: 0.8 g/dL (ref 0.7–1.3)
Gamma Globulin: 0.4 g/dL (ref 0.4–1.8)
Globulin, Total: 2.1 g/dL — ABNORMAL LOW (ref 2.2–3.9)
M-Spike, %: 0.1 g/dL — ABNORMAL HIGH
Total Protein ELP: 5 g/dL — ABNORMAL LOW (ref 6.0–8.5)

## 2022-11-16 ENCOUNTER — Other Ambulatory Visit: Payer: Self-pay

## 2022-11-16 MED ORDER — LENALIDOMIDE 15 MG PO CAPS: 15.0000 mg | ORAL_CAPSULE | Freq: Every day | ORAL | 0 refills | Status: DC

## 2022-11-16 NOTE — Telephone Encounter (Signed)
Chart reviewed. Revlimid refilled per last office note with Dr. Katragadda.  

## 2022-11-19 ENCOUNTER — Other Ambulatory Visit: Payer: Self-pay

## 2022-12-01 ENCOUNTER — Other Ambulatory Visit: Payer: Self-pay

## 2022-12-08 ENCOUNTER — Inpatient Hospital Stay: Payer: Medicare Other | Attending: Hematology | Admitting: Hematology

## 2022-12-08 ENCOUNTER — Inpatient Hospital Stay: Payer: Medicare Other

## 2022-12-08 VITALS — BP 127/50 | HR 73 | Resp 19

## 2022-12-08 VITALS — Wt 122.4 lb

## 2022-12-08 DIAGNOSIS — Z7984 Long term (current) use of oral hypoglycemic drugs: Secondary | ICD-10-CM | POA: Insufficient documentation

## 2022-12-08 DIAGNOSIS — D509 Iron deficiency anemia, unspecified: Secondary | ICD-10-CM | POA: Diagnosis not present

## 2022-12-08 DIAGNOSIS — Z7961 Long term (current) use of immunomodulator: Secondary | ICD-10-CM | POA: Diagnosis not present

## 2022-12-08 DIAGNOSIS — E876 Hypokalemia: Secondary | ICD-10-CM | POA: Insufficient documentation

## 2022-12-08 DIAGNOSIS — I130 Hypertensive heart and chronic kidney disease with heart failure and stage 1 through stage 4 chronic kidney disease, or unspecified chronic kidney disease: Secondary | ICD-10-CM | POA: Diagnosis not present

## 2022-12-08 DIAGNOSIS — Z7982 Long term (current) use of aspirin: Secondary | ICD-10-CM | POA: Insufficient documentation

## 2022-12-08 DIAGNOSIS — C9000 Multiple myeloma not having achieved remission: Secondary | ICD-10-CM

## 2022-12-08 DIAGNOSIS — D631 Anemia in chronic kidney disease: Secondary | ICD-10-CM | POA: Diagnosis not present

## 2022-12-08 DIAGNOSIS — Z79624 Long term (current) use of inhibitors of nucleotide synthesis: Secondary | ICD-10-CM | POA: Insufficient documentation

## 2022-12-08 DIAGNOSIS — I2699 Other pulmonary embolism without acute cor pulmonale: Secondary | ICD-10-CM | POA: Insufficient documentation

## 2022-12-08 DIAGNOSIS — Z79899 Other long term (current) drug therapy: Secondary | ICD-10-CM | POA: Diagnosis not present

## 2022-12-08 DIAGNOSIS — Z87891 Personal history of nicotine dependence: Secondary | ICD-10-CM | POA: Insufficient documentation

## 2022-12-08 DIAGNOSIS — N189 Chronic kidney disease, unspecified: Secondary | ICD-10-CM | POA: Insufficient documentation

## 2022-12-08 DIAGNOSIS — D473 Essential (hemorrhagic) thrombocythemia: Secondary | ICD-10-CM | POA: Insufficient documentation

## 2022-12-08 DIAGNOSIS — I739 Peripheral vascular disease, unspecified: Secondary | ICD-10-CM | POA: Diagnosis not present

## 2022-12-08 DIAGNOSIS — D75839 Thrombocytosis, unspecified: Secondary | ICD-10-CM | POA: Insufficient documentation

## 2022-12-08 DIAGNOSIS — Z5112 Encounter for antineoplastic immunotherapy: Secondary | ICD-10-CM | POA: Insufficient documentation

## 2022-12-08 DIAGNOSIS — E7800 Pure hypercholesterolemia, unspecified: Secondary | ICD-10-CM | POA: Diagnosis not present

## 2022-12-08 LAB — CBC WITH DIFFERENTIAL/PLATELET
Abs Immature Granulocytes: 0.04 10*3/uL (ref 0.00–0.07)
Basophils Absolute: 0 10*3/uL (ref 0.0–0.1)
Basophils Relative: 1 %
Eosinophils Absolute: 0 10*3/uL (ref 0.0–0.5)
Eosinophils Relative: 0 %
HCT: 33.8 % — ABNORMAL LOW (ref 36.0–46.0)
Hemoglobin: 11 g/dL — ABNORMAL LOW (ref 12.0–15.0)
Immature Granulocytes: 1 %
Lymphocytes Relative: 14 %
Lymphs Abs: 0.9 10*3/uL (ref 0.7–4.0)
MCH: 30.6 pg (ref 26.0–34.0)
MCHC: 32.5 g/dL (ref 30.0–36.0)
MCV: 94.2 fL (ref 80.0–100.0)
Monocytes Absolute: 0.2 10*3/uL (ref 0.1–1.0)
Monocytes Relative: 3 %
Neutro Abs: 5.4 10*3/uL (ref 1.7–7.7)
Neutrophils Relative %: 81 %
Platelets: 345 10*3/uL (ref 150–400)
RBC: 3.59 MIL/uL — ABNORMAL LOW (ref 3.87–5.11)
RDW: 20.6 % — ABNORMAL HIGH (ref 11.5–15.5)
WBC: 6.6 10*3/uL (ref 4.0–10.5)
nRBC: 0 % (ref 0.0–0.2)

## 2022-12-08 LAB — COMPREHENSIVE METABOLIC PANEL
ALT: 15 U/L (ref 0–44)
AST: 14 U/L — ABNORMAL LOW (ref 15–41)
Albumin: 3.1 g/dL — ABNORMAL LOW (ref 3.5–5.0)
Alkaline Phosphatase: 56 U/L (ref 38–126)
Anion gap: 8 (ref 5–15)
BUN: 17 mg/dL (ref 8–23)
CO2: 24 mmol/L (ref 22–32)
Calcium: 8.2 mg/dL — ABNORMAL LOW (ref 8.9–10.3)
Chloride: 106 mmol/L (ref 98–111)
Creatinine, Ser: 1.34 mg/dL — ABNORMAL HIGH (ref 0.44–1.00)
GFR, Estimated: 38 mL/min — ABNORMAL LOW (ref >60)
Glucose, Bld: 134 mg/dL — ABNORMAL HIGH (ref 70–99)
Potassium: 3.7 mmol/L (ref 3.5–5.1)
Sodium: 138 mmol/L (ref 135–145)
Total Bilirubin: 0.7 mg/dL (ref 0.3–1.2)
Total Protein: 5.3 g/dL — ABNORMAL LOW (ref 6.5–8.1)

## 2022-12-08 LAB — MAGNESIUM: Magnesium: 1.6 mg/dL — ABNORMAL LOW (ref 1.7–2.4)

## 2022-12-08 MED ORDER — SODIUM CHLORIDE 0.9% FLUSH
10.0000 mL | Freq: Once | INTRAVENOUS | Status: AC
  Administered 2022-12-08: 10 mL via INTRAVENOUS

## 2022-12-08 MED ORDER — ACETAMINOPHEN 325 MG PO TABS
650.0000 mg | ORAL_TABLET | Freq: Once | ORAL | Status: AC
  Administered 2022-12-08: 650 mg via ORAL
  Filled 2022-12-08: qty 2

## 2022-12-08 MED ORDER — DARATUMUMAB-HYALURONIDASE-FIHJ 1800-30000 MG-UT/15ML ~~LOC~~ SOLN
1800.0000 mg | Freq: Once | SUBCUTANEOUS | Status: AC
  Administered 2022-12-08: 1800 mg via SUBCUTANEOUS
  Filled 2022-12-08: qty 15

## 2022-12-08 MED ORDER — MAGNESIUM SULFATE 2 GM/50ML IV SOLN
2.0000 g | Freq: Once | INTRAVENOUS | Status: AC
  Administered 2022-12-08: 2 g via INTRAVENOUS
  Filled 2022-12-08: qty 50

## 2022-12-08 MED ORDER — CETIRIZINE HCL 10 MG PO TABS
10.0000 mg | ORAL_TABLET | Freq: Once | ORAL | Status: AC
  Administered 2022-12-08: 10 mg via ORAL
  Filled 2022-12-08: qty 1

## 2022-12-08 MED ORDER — SODIUM CHLORIDE 0.9 % IV SOLN: Freq: Once | INTRAVENOUS | Status: AC

## 2022-12-08 NOTE — Progress Notes (Signed)
Patient tolerated Darzalex injection with no complaints voiced.  See MAR for details.  Labs reviewed. Injection site clean and dry with no bruising or swelling noted at site.  Band aid applied. Pt tolerated magnesium infusion well with no complications. Blood return noted from port before and after infusion. Vss with discharge and left in satisfactory condition with no s/s of distress noted. All follow ups as scheduled.  Mayelin Panos [employer]

## 2022-12-08 NOTE — Progress Notes (Signed)
[facility] [address], [place] [address]    Clinic Day:  12/08/2022  Referring physician: Benetta Spar*  Patient Care Team: Benetta Spar, MD as PCP - General (Internal Medicine) Wyline Mood Dorothe Pea, MD as PCP - Cardiology (Cardiology) Doreatha Massed, MD as Medical Oncologist (Medical Oncology)   ASSESSMENT & PLAN:   Assessment: 1.  Biclonal IgG lambda and IgM kappa multiple myeloma, high risk: - BMBX (11/02/2021): IgG lambda plasma cells 70% - Myeloma FISH panel: Monosomy 13 (standard GIST), duplication of 1 q. (high risk), gain of 11 q. (standard risk). - Cytogenetics: Hyperdiploid E weight gain (trisomy/trisomy) of odd-numbered chromosomes and monosomy 13, standard risk.  Gain of 1 q. associated with progressive course. - PET scan (11/05/2021): No evidence of multiple myeloma.  No hypermetabolic adenopathy. - Daratumumab, Revlimid and dexamethasone cycle 1 started on 11/30/2021   2.  JAK2 positive essential thrombocytosis: - BMBX (11/02/2021): No evidence of fibrosis.  No morphological evidence of JAK2 positive ET.   3.  History of marginal zone lymphoma of the spleen: - Diagnosed in 1999, status post CHOP x7 cycles followed by rituximab 1 cycle. - Status post splenectomy.  No evidence of lymphoma on current PET scan.   4.  Social/family history: - She lives with her sister.  Son lives 30 minutes away.  She is independent of ADLs and some IADLs.  She drives.  She worked at [employer] and denies any exposure to chemicals or pesticides.  No family history of malignancies.  5.  Bone protection (DEXA scan 02/17/2022 T-score -0.9): - She saw dentist who recommended dental extractions.  She does not want zoledronic acid or denosumab.    Plan: 1.  IgG lambda multiple myeloma: - She is tolerating Revlimid reasonably well.  She is taking dexamethasone 10 mg on Wednesdays. - She denies any infections.  No new onset pains  reported. - Reviewed myeloma labs from 11/10/2022: M spike is stable at 0.1 g.  Free light chain ratio is  2.17.  Lambda light chains are normal at 24.7 and kappa light chains 53.5.  Immunofixation is normal. - I will decrease his Revlimid dose to 10 mg 3 weeks on/1 week off. - She will continue with daratumumab today and monthly. - RTC 12 weeks for follow-up with repeat myeloma labs 4 weeks prior.   2.  Anemia from CKD and iron deficiency: - She received Feraheme on 09/17/2022 on 09/24/2022. - Hemoglobin has improved to 11.0 from 8.4 previously.  No indication for parenteral iron therapy at this time.  Will check ferritin and iron panel at next visit.   3.  ID prophylaxis: - Continue acyclovir twice daily.   4.  Pulmonary embolism (Dx 07/03/2022):: - Continue Eliquis twice daily indefinitely.  No bleeding issues reported.   5.  Hypokalemia: - Continue potassium 40 mill equivalents daily.  Potassium today is 3.7.    No orders of the defined types were placed in this encounter.      Doreatha Massed, MD   10/16/202412:56 PM  CHIEF COMPLAINT:   Diagnosis: multiple myeloma    Cancer Staging  Multiple myeloma (HCC) Staging form: Plasma Cell Myeloma and Plasma Cell Disorders, AJCC 8th Edition - Clinical stage from 11/16/2021: Albumin (g/dL): 3.1, ISS: Stage II, High-risk cytogenetics: Absent, LDH: Normal - Unsigned    Prior Therapy: none  Current Therapy:  Daratumumab, Revlimid and dexamethasone    HISTORY OF PRESENT ILLNESS:   Oncology History  NHL (non-Hodgkin's lymphoma) (  HCC)  07/19/1995 Pathology Results   Spleen biopsy with resection- low-grade B-cell lymphoma, splenic marginal zone type   02/20/1998 Imaging   CT CAP-marked and extensive cervical adenopathy   02/25/1998 Pathology Results   Right cervical lymph node demonstrating large B-cell lymphoma   03/05/1998 Bone Marrow Biopsy   No evidence of bone marrow involvement   03/11/1998 - 07/18/1998 Chemotherapy    CHOP x 7 cycles   05/09/1998 Imaging   CT CAP and neck- slight interval decrease in size of para-aortic adenopathy with interval decrease in right inguinal and bi-iliac adenopathy.  Interval decrease in size and number of enlarged cervical nodes   07/10/1998 Imaging   Ct CAP and neck- unremarkable CT of chest. No enlarged retroperitoneal adenopathy withthe previously noted retroperitoneal nodes currently smaller to stable in size.  Cervical adenopathy is stable to slightly smaller in size.   08/18/1998 - 09/08/1998 Chemotherapy   Rituxan Day 1, 8, 15, 22 x 1 cycle   01/08/1999 Remission   CT CAP and neck demonstrates no adenopathy or disease   01/08/1999 Imaging   CT CAP and neck- Stable CT of chest. Stable CT abd/pelvis without adenopathy.  Negative CT of neck   Multiple myeloma (HCC)  11/16/2021 Initial Diagnosis   Multiple myeloma (HCC)   11/30/2021 -  Chemotherapy   Patient is on Treatment Plan : MYELOMA  Daratumumab SQ + Lenalidomide + Dexamethasone (DaraRd) q28d        INTERVAL HISTORY:   Rosi is a 89 y.o. female seen for follow-up of her multiple myeloma.  She is taking Revlimid 15 mg 3 weeks on/1 week off.  She is also taking dexamethasone once a week.  Reports energy levels of 60% and appetite of 100%.  Denies any infections in the last 3 months.  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)    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   Vertigo, labyrinthine     Surgical History: Past Surgical History:  Procedure Laterality Date   CATARACT EXTRACTION W/PHACO Left 11/03/2015   Procedure: CATARACT EXTRACTION PHACO AND INTRAOCULAR LENS PLACEMENT (IOC);  Surgeon: Gemma Payor, MD;  Location: [facility];  Service: Ophthalmology;  Laterality: Left;  CDE: 7.74   CATARACT EXTRACTION W/PHACO Right 11/20/2015   Procedure: CATARACT EXTRACTION PHACO AND INTRAOCULAR LENS PLACEMENT ; CDE:  8.30;  Surgeon: Gemma Payor, MD;  Location: [facility];  Service: Ophthalmology;  Laterality: Right;   IR IMAGING GUIDED PORT INSERTION  11/04/2021   PORT-A-CATH REMOVAL      Social History: Social History   Socioeconomic History   Marital status: Widowed    Spouse name: Not on file   Number of children: Not on file   Years of education: Not on file   Highest education level: Not on file  Occupational History  Not on file  Tobacco Use   Smoking status: Former   Smokeless tobacco: Never  Vaping Use   Vaping status: Never Used  Substance and Sexual Activity   Alcohol use: No   Drug use: No   Sexual activity: Not Currently  Other Topics Concern   Not on file  Social History Narrative   Not on file   Social Determinants of Health   Financial Resource Strain: Low Risk  (12/31/2019)   Overall Financial Resource Strain (CARDIA)    Difficulty of Paying Living Expenses: Not hard at all  Food Insecurity: No Food Insecurity (07/03/2022)   Hunger Vital Sign    Worried About Running Out of Food in the Last Year: Never true    Ran Out of Food in the Last Year: Never true  Transportation Needs: No Transportation Needs (07/03/2022)   PRAPARE - Administrator, Civil Service (Medical): No    Lack of Transportation (Non-Medical): No  Physical Activity: Inactive (12/31/2019)   Exercise Vital Sign    Days of Exercise per Week: 0 days    Minutes of Exercise per Session: 0 min  Stress: No  Stress Concern Present (12/31/2019)   [employer] of Occupational Health - Occupational Stress Questionnaire    Feeling of Stress : Not at all  Social Connections: Moderately Isolated (12/31/2019)   Social Connection and Isolation Panel [NHANES]    Frequency of Communication with Friends and Family: More than three times a week    Frequency of Social Gatherings with Friends and Family: More than three times a week    Attends Religious Services: More than 4 times per year    Active Member of [employer] or Organizations: No    Attends Banker Meetings: Never    Marital Status: Widowed  Intimate Partner Violence: Not At Risk (07/03/2022)   Humiliation, Afraid, Rape, and Kick questionnaire    Fear of Current or Ex-Partner: No    Emotionally Abused: No    Physically Abused: No    Sexually Abused: No    Family History: Family History  Problem Relation Age of Onset   Diabetes Mother     Current Medications:  Current Outpatient Medications:    acetaminophen (TYLENOL) 500 MG tablet, Take 1,000 mg by mouth every 6 (six) hours as needed for moderate pain., Disp: , Rfl:    acyclovir (ZOVIRAX) 400 MG tablet, Take 1 tablet (400 mg total) by mouth 2 (two) times daily., Disp: 60 tablet, Rfl: 6   apixaban (ELIQUIS) 5 MG TABS tablet, Take 1 tablet (5 mg total) by mouth 2 (two) times daily., Disp: 60 tablet, Rfl: 1   APIXABAN (ELIQUIS) VTE STARTER PACK (10MG  AND 5MG ), Take as directed on package: start with two-5mg  tablets twice daily for 7 days. On day 8, switch to one-5mg  tablet twice daily., Disp: 74 each, Rfl: 0   ASPIRIN LOW DOSE 81 MG chewable tablet, Chew 81 mg by mouth daily., Disp: , Rfl:    atorvastatin (LIPITOR) 40 MG tablet, Take 40 mg by mouth at bedtime., Disp: , Rfl:    dapagliflozin propanediol (FARXIGA) 10 MG TABS tablet, Take 1 tablet (10 mg total) by mouth daily., Disp: 30 tablet, Rfl: 1   dexamethasone (DECADRON) 4 MG tablet, Take 10 mg (2.5 tablets) by mouth weekly  every Wednesday morning Per note by Dr. Ellin Saba on 09/15/2022, Disp: 20 tablet, Rfl: 6   furosemide (LASIX) 40 MG tablet, Take 40mg  as needed if you gain more than 3  pounds in 1 day or 5 pounds in 1 week, Disp: 30 tablet, Rfl: 1   lenalidomide (REVLIMID) 15 MG capsule, Take 1 capsule (15 mg total) by mouth daily. Take for 21 days on, 7 days off., Disp: 21 capsule, Rfl: 0   losartan (COZAAR) 25 MG tablet, Take 12.5 mg by mouth daily., Disp: , Rfl:    metoprolol succinate (TOPROL-XL) 25 MG 24 hr tablet, Take 0.5 tablets (12.5 mg total) by mouth daily., Disp: 30 tablet, Rfl: 0   potassium chloride SA (KLOR-CON M20) 20 MEQ tablet, Take 1 tablet on the days you take lasix, Disp: 180 tablet, Rfl: 3   spironolactone (ALDACTONE) 25 MG tablet, Take 25 mg by mouth 2 (two) times daily., Disp: , Rfl:    lidocaine-prilocaine (EMLA) cream, Apply 1 Application topically as needed. (Patient not taking: Reported on 12/08/2022), Disp: 30 g, Rfl: 1   Allergies: Allergies  Allergen Reactions   Penicillins Rash    REVIEW OF SYSTEMS:   Review of Systems  Constitutional:  Negative for chills, fatigue and fever.  HENT:   Negative for lump/mass, mouth sores, nosebleeds, sore throat and trouble swallowing.   Eyes:  Negative for eye problems.  Respiratory:  Negative for cough and shortness of breath.   Cardiovascular:  Negative for chest pain, leg swelling and palpitations.  Gastrointestinal:  Negative for abdominal pain, constipation, diarrhea, nausea and vomiting.  Genitourinary:  Negative for bladder incontinence, difficulty urinating, dysuria, frequency, hematuria and nocturia.   Musculoskeletal:  Negative for arthralgias, back pain, flank pain, myalgias and neck pain.  Skin:  Negative for itching and rash.  Neurological:  Negative for dizziness, headaches and numbness.  Hematological:  Does not bruise/bleed easily.  Psychiatric/Behavioral:  Negative for depression, sleep disturbance and suicidal ideas. The  patient is not nervous/anxious.   All other systems reviewed and are negative.    VITALS:   Weight 122 lb 6.4 oz (55.5 kg).  Wt Readings from Last 3 Encounters:  12/08/22 122 lb 6.4 oz (55.5 kg)  11/10/22 123 lb (55.8 kg)  10/13/22 123 lb 3.2 oz (55.9 kg)    Body mass index is 23.9 kg/m.  Performance status (ECOG): 1 - Symptomatic but completely ambulatory  PHYSICAL EXAM:   Physical Exam Vitals and nursing note reviewed. Exam conducted with a chaperone present.  Constitutional:      Appearance: Normal appearance.  Cardiovascular:     Rate and Rhythm: Normal rate and regular rhythm.     Pulses: Normal pulses.     Heart sounds: Normal heart sounds.  Pulmonary:     Effort: Pulmonary effort is normal.     Breath sounds: Normal breath sounds.  Abdominal:     Palpations: Abdomen is soft. There is no hepatomegaly, splenomegaly or mass.     Tenderness: There is no abdominal tenderness.  Musculoskeletal:     Right lower leg: 2+ Edema present.     Left lower leg: 2+ Edema present.  Lymphadenopathy:     Cervical: No cervical adenopathy.     Right cervical: No superficial, deep or posterior cervical adenopathy.    Left cervical: No superficial, deep or posterior cervical adenopathy.     Upper Body:     Right upper body: No supraclavicular or axillary adenopathy.     Left upper body: No supraclavicular or axillary adenopathy.  Neurological:     General: No focal deficit present.     Mental Status: She is alert and oriented to person, place, and time.  Psychiatric:  Mood and Affect: Mood normal.        Behavior: Behavior normal.    LABS:      Latest Ref Rng & Units 12/08/2022   12:00 PM 11/10/2022    1:14 PM 10/13/2022   11:52 AM  CBC  WBC 4.0 - 10.5 K/uL 6.6  6.6  6.7   Hemoglobin 12.0 - 15.0 g/dL 72.5  36.6  44.0   Hematocrit 36.0 - 46.0 % 33.8  35.4  35.6   Platelets 150 - 400 K/uL 345  307  276       Latest Ref Rng & Units 11/10/2022    1:14 PM 09/15/2022    11:33 AM 08/18/2022    2:12 PM  CMP  Glucose 70 - 99 mg/dL 347  425  956   BUN 8 - 23 mg/dL 19  35  24   Creatinine 0.44 - 1.00 mg/dL 3.87  5.64  3.32   Sodium 135 - 145 mmol/L 139  138  136   Potassium 3.5 - 5.1 mmol/L 3.2  3.7  3.6   Chloride 98 - 111 mmol/L 108  108  104   CO2 22 - 32 mmol/L 22  22  22    Calcium 8.9 - 10.3 mg/dL 8.6  8.7  8.6   Total Protein 6.5 - 8.1 g/dL 5.4  5.5  5.5   Total Bilirubin 0.3 - 1.2 mg/dL 0.9  0.4  0.7   Alkaline Phos 38 - 126 U/L 50  45  49   AST 15 - 41 U/L 14  12  11    ALT 0 - 44 U/L 13  12  10       No results found for: "CEA1", "CEA" / No results found for: "CEA1", "CEA" No results found for: "PSA1" No results found for: "CAN199" No results found for: "CAN125"  Lab Results  Component Value Date   TOTALPROTELP 5.0 (L) 11/10/2022   TOTALPROTELP 4.8 (L) 11/10/2022   ALBUMINELP 2.9 11/10/2022   A1GS 0.3 11/10/2022   A2GS 0.6 11/10/2022   BETS 0.8 11/10/2022   GAMS 0.4 11/10/2022   MSPIKE 0.1 (H) 11/10/2022   SPEI Comment 11/10/2022   Lab Results  Component Value Date   TIBC 375 09/15/2022   TIBC 249 (L) 03/10/2022   TIBC 291 09/30/2021   FERRITIN 7 (L) 09/15/2022   FERRITIN 93 03/10/2022   FERRITIN 45 09/30/2021   IRONPCTSAT 5 (L) 09/15/2022   IRONPCTSAT 8 (L) 03/10/2022   IRONPCTSAT 19 09/30/2021   Lab Results  Component Value Date   LDH 154 09/30/2021   LDH 148 08/05/2021   LDH 122 07/22/2021     STUDIES:   No results found.

## 2022-12-08 NOTE — Patient Instructions (Addendum)
[facility]  Discharge Instructions  You were seen and examined today by Dr. Ellin Saba.  Dr. Ellin Saba discussed your most recent lab work which showed no active myeloma.  Continue the monthly treatments here in the Cancer Center. Continue Revlimid as prescribed, Dr. Ellin Saba will decrease it to 10mg  instead of 15mg .  Follow-up as scheduled.  Thank you for choosing [facility] to provide your oncology and hematology care.   To afford each patient quality time with our provider, please arrive at least 15 minutes before your scheduled appointment time. You may need to reschedule your appointment if you arrive late (10 or more minutes). Arriving late affects you and other patients whose appointments are after yours.  Also, if you miss three or more appointments without notifying the office, you may be dismissed from the clinic at the provider's discretion.    Again, thank you for choosing [facility]  Our hope is that these requests will decrease the amount of time that you wait before being seen by our physicians.   If you have a lab appointment with the Cancer Center - please note that after April 8th, all labs will be drawn in the cancer center.  You do not have to check in or register with the main entrance as you have in the past but will complete your check-in at the cancer center.            _____________________________________________________________  Should you have questions after your visit to [place], please contact our office at [phone] and follow the prompts.  Our office hours are 8:00 a.m. to 4:30 p.m. Monday - Thursday and 8:00 a.m. to 2:30 p.m. Friday.  Please note that voicemails left after 4:00 p.m. may not be returned until the following business day.  We are closed weekends and all major holidays.  You do have access to a nurse 24-7, just call the main number to the clinic  [phone] and do not press any options, hold on the line and a nurse will answer the phone.    For prescription refill requests, have your pharmacy contact our office and allow 72 hours.    Masks are no longer required in the cancer centers. If you would like for your care team to wear a mask while they are taking care of you, please let them know. You may have one support person who is at least 87 years old accompany you for your appointments.

## 2022-12-08 NOTE — Progress Notes (Signed)
Patient is taking Revlimid as prescribed. She has not missed any doses and reports no side effects at this time.  Patient has been assessed, vital signs and labs have been reviewed by Dr. Ellin Saba. ANC, Creatinine, LFTs, and Platelets are within treatment parameters per Dr. Ellin Saba. The patient is good to proceed with treatment at this time.  Primary RN and pharmacy aware.

## 2022-12-08 NOTE — Patient Instructions (Signed)
[facility]  Discharge Instructions: Thank you for choosing [facility] to provide your oncology and hematology care.  If you have a lab appointment with the Cancer Center - please note that after April 8th, 2024, all labs will be drawn in the cancer center.  You do not have to check in or register with the main entrance as you have in the past but will complete your check-in in the cancer center.  Wear comfortable clothing and clothing appropriate for easy access to any Portacath or PICC line.   We strive to give you quality time with your provider. You may need to reschedule your appointment if you arrive late (15 or more minutes).  Arriving late affects you and other patients whose appointments are after yours.  Also, if you miss three or more appointments without notifying the office, you may be dismissed from the clinic at the provider's discretion.      For prescription refill requests, have your pharmacy contact our office and allow 72 hours for refills to be completed.    Today you received the following chemotherapy and/or immunotherapy agents Darzalex SQ   To help prevent nausea and vomiting after your treatment, we encourage you to take your nausea medication as directed.  BELOW ARE SYMPTOMS THAT SHOULD BE REPORTED IMMEDIATELY: *FEVER GREATER THAN 100.4 F (38 C) OR HIGHER *CHILLS OR SWEATING *NAUSEA AND VOMITING THAT IS NOT CONTROLLED WITH YOUR NAUSEA MEDICATION *UNUSUAL SHORTNESS OF BREATH *UNUSUAL BRUISING OR BLEEDING *URINARY PROBLEMS (pain or burning when urinating, or frequent urination) *BOWEL PROBLEMS (unusual diarrhea, constipation, pain near the anus) TENDERNESS IN MOUTH AND THROAT WITH OR WITHOUT PRESENCE OF ULCERS (sore throat, sores in mouth, or a toothache) UNUSUAL RASH, SWELLING OR PAIN  UNUSUAL VAGINAL DISCHARGE OR ITCHING   Items with * indicate a potential emergency and should be followed up as soon as possible or go to the  Emergency Department if any problems should occur.  Please show the CHEMOTHERAPY ALERT CARD or IMMUNOTHERAPY ALERT CARD at check-in to the Emergency Department and triage nurse.  Should you have questions after your visit or need to cancel or reschedule your appointment, please contact [facility] CENTER AT [facility] [phone]  and follow the prompts.  Office hours are 8:00 a.m. to 4:30 p.m. Monday - Friday. Please note that voicemails left after 4:00 p.m. may not be returned until the following business day.  We are closed weekends and major holidays. You have access to a nurse at all times for urgent questions. Please call the main number to the clinic [phone] and follow the prompts.  For any non-urgent questions, you may also contact your provider using MyChart. We now offer e-Visits for anyone 44 and older to request care online for non-urgent symptoms. For details visit [email or web address].   Also download the MyChart app! Go to the app store, search "MyChart", open the app, select [place], and log in with your MyChart username and password.

## 2022-12-10 ENCOUNTER — Other Ambulatory Visit: Payer: Self-pay

## 2022-12-10 MED ORDER — LENALIDOMIDE 10 MG PO CAPS: 10.0000 mg | ORAL_CAPSULE | Freq: Every day | ORAL | 0 refills | Status: DC

## 2022-12-10 NOTE — Telephone Encounter (Signed)
Chart reviewed. Revlimid refilled per last office note with Dr. Katragadda.  

## 2022-12-24 ENCOUNTER — Other Ambulatory Visit: Payer: Self-pay

## 2022-12-24 MED ORDER — DEXAMETHASONE 4 MG PO TABS: ORAL_TABLET | ORAL | 6 refills | Status: DC

## 2023-01-03 ENCOUNTER — Emergency Department (HOSPITAL_COMMUNITY): Payer: Medicare Other

## 2023-01-03 ENCOUNTER — Emergency Department (HOSPITAL_COMMUNITY)
Admission: EM | Admit: 2023-01-03 | Discharge: 2023-01-03 | Disposition: A | Discharge status: Home / Self Care | Payer: Medicare Other | Attending: Emergency Medicine | Admitting: Emergency Medicine

## 2023-01-03 ENCOUNTER — Encounter (HOSPITAL_COMMUNITY): Payer: Self-pay

## 2023-01-03 ENCOUNTER — Other Ambulatory Visit: Payer: Self-pay

## 2023-01-03 DIAGNOSIS — I509 Heart failure, unspecified: Secondary | ICD-10-CM | POA: Insufficient documentation

## 2023-01-03 DIAGNOSIS — E876 Hypokalemia: Secondary | ICD-10-CM

## 2023-01-03 DIAGNOSIS — M7989 Other specified soft tissue disorders: Secondary | ICD-10-CM | POA: Diagnosis not present

## 2023-01-03 DIAGNOSIS — Z7901 Long term (current) use of anticoagulants: Secondary | ICD-10-CM | POA: Insufficient documentation

## 2023-01-03 DIAGNOSIS — R2241 Localized swelling, mass and lump, right lower limb: Secondary | ICD-10-CM | POA: Diagnosis not present

## 2023-01-03 DIAGNOSIS — R600 Localized edema: Secondary | ICD-10-CM | POA: Diagnosis not present

## 2023-01-03 DIAGNOSIS — R001 Bradycardia, unspecified: Secondary | ICD-10-CM | POA: Diagnosis not present

## 2023-01-03 DIAGNOSIS — D649 Anemia, unspecified: Secondary | ICD-10-CM | POA: Diagnosis not present

## 2023-01-03 DIAGNOSIS — S8991XA Unspecified injury of right lower leg, initial encounter: Secondary | ICD-10-CM | POA: Diagnosis not present

## 2023-01-03 DIAGNOSIS — Z7982 Long term (current) use of aspirin: Secondary | ICD-10-CM | POA: Insufficient documentation

## 2023-01-03 DIAGNOSIS — M79661 Pain in right lower leg: Secondary | ICD-10-CM | POA: Diagnosis not present

## 2023-01-03 DIAGNOSIS — M79604 Pain in right leg: Secondary | ICD-10-CM | POA: Diagnosis not present

## 2023-01-03 DIAGNOSIS — M170 Bilateral primary osteoarthritis of knee: Secondary | ICD-10-CM | POA: Diagnosis not present

## 2023-01-03 LAB — BASIC METABOLIC PANEL
Anion gap: 7 (ref 5–15)
BUN: 14 mg/dL (ref 8–23)
CO2: 24 mmol/L (ref 22–32)
Calcium: 8.3 mg/dL — ABNORMAL LOW (ref 8.9–10.3)
Chloride: 108 mmol/L (ref 98–111)
Creatinine, Ser: 1.02 mg/dL — ABNORMAL HIGH (ref 0.44–1.00)
GFR, Estimated: 53 mL/min — ABNORMAL LOW (ref >60)
Glucose, Bld: 79 mg/dL (ref 70–99)
Potassium: 3.1 mmol/L — ABNORMAL LOW (ref 3.5–5.1)
Sodium: 139 mmol/L (ref 135–145)

## 2023-01-03 LAB — CBC WITH DIFFERENTIAL/PLATELET
Abs Immature Granulocytes: 0.04 10*3/uL (ref 0.00–0.07)
Basophils Absolute: 0 10*3/uL (ref 0.0–0.1)
Basophils Relative: 1 %
Eosinophils Absolute: 0 10*3/uL (ref 0.0–0.5)
Eosinophils Relative: 1 %
HCT: 32.7 % — ABNORMAL LOW (ref 36.0–46.0)
Hemoglobin: 10.5 g/dL — ABNORMAL LOW (ref 12.0–15.0)
Immature Granulocytes: 1 %
Lymphocytes Relative: 33 %
Lymphs Abs: 2.9 10*3/uL (ref 0.7–4.0)
MCH: 30.2 pg (ref 26.0–34.0)
MCHC: 32.1 g/dL (ref 30.0–36.0)
MCV: 94 fL (ref 80.0–100.0)
Monocytes Absolute: 0.5 10*3/uL (ref 0.1–1.0)
Monocytes Relative: 5 %
Neutro Abs: 5.2 10*3/uL (ref 1.7–7.7)
Neutrophils Relative %: 59 %
Platelets: 400 10*3/uL (ref 150–400)
RBC: 3.48 MIL/uL — ABNORMAL LOW (ref 3.87–5.11)
RDW: 18.7 % — ABNORMAL HIGH (ref 11.5–15.5)
WBC: 8.7 10*3/uL (ref 4.0–10.5)
nRBC: 0.3 % — ABNORMAL HIGH (ref 0.0–0.2)

## 2023-01-03 MED ORDER — HEPARIN SOD (PORK) LOCK FLUSH 100 UNIT/ML IV SOLN
INTRAVENOUS | Status: AC
  Filled 2023-01-03: qty 5

## 2023-01-03 MED ORDER — POTASSIUM CHLORIDE CRYS ER 20 MEQ PO TBCR
40.0000 meq | EXTENDED_RELEASE_TABLET | Freq: Once | ORAL | Status: AC
  Administered 2023-01-03: 40 meq via ORAL
  Filled 2023-01-03: qty 2

## 2023-01-03 MED ORDER — GADOBUTROL 1 MMOL/ML IV SOLN
6.0000 mL | Freq: Once | INTRAVENOUS | Status: AC | PRN
  Administered 2023-01-03: 6 mL via INTRAVENOUS

## 2023-01-03 NOTE — ED Notes (Signed)
Rounding Warm blankets given Son requested food for pt Informed that we are waiting on test results   Call bell on stretcher Pt has no other needs at this time

## 2023-01-03 NOTE — ED Provider Notes (Signed)
  Physical Exam  BP (!) 131/56 (BP Location: Right Arm)   Pulse (!) 47   Temp 97.9 F (36.6 C) (Oral)   Resp 16   Ht 5\' 5"  (1.651 m)   Wt 57.6 kg   SpO2 97%   BMI 21.13 kg/m   Physical Exam  Procedures  Procedures  ED Course / MDM    Medical Decision Making Amount and/or Complexity of Data Reviewed Labs: ordered. Radiology: ordered.  Risk Prescription drug management.   Patient care taken over at shift change.  Disposition pending MRI results.  MRI results received at 18:56. Imaging showed enhancing subcutaneous mass, most likely a soft tissue neoplasm. Soft tissue sample recommended.  I then consulted and spoke with Dr. Ellin Saba, with oncology, who recommended outpatient follow up - who she currently follows with for her multiple myeloma.  I informed patient and family member at bedside of results. All questions were answered.  She is stable and safe for discharge home. Return precautions provided.   Maxwell Marion, PA-C 01/04/23 1058    Bethann Berkshire, MD 01/04/23 1244

## 2023-01-03 NOTE — ED Triage Notes (Signed)
Overnight, developed "knot" on her right inner lower leg. Area of swelling of about 3 inches x 3 inches noted. Slightly warmer than the surrounding tissue. Tender to palpation.

## 2023-01-03 NOTE — ED Notes (Signed)
Pt in MRI.

## 2023-01-03 NOTE — Discharge Instructions (Addendum)
As discussed, the mass on your leg may be malignant.  Call Dr. Marice Potter office tomorrow and schedule an appointment for a tissue biopsy.  Continue taking Tylenol as needed for pain.  Follow up with your PCP in the next week for reevaluation of your low potassium levels.  Return to the ED if your symptoms worsen in the interim.

## 2023-01-03 NOTE — ED Provider Notes (Signed)
[place] EMERGENCY DEPARTMENT AT [facility] Provider Note   CSN: [id number] Arrival date & time: 01/03/23  1033     History Chief Complaint  Patient presents with   Leg Swelling    Leslie Williamson is a 89 y.o. female with h/o of MM in remission, CHF, on Eliquis for PE previously, presents to the ER for evlaution of right leg mass. She reports that she woke up in the middle of the night with her leg hurting. She denies any fall or trauma to the area before. She took some Tylenol and reports some relief of pain. Denies any numbness or tingling. Denies any chest pain, SOB, or any fevers. Currently on Eliquis.   HPI     Home Medications Prior to Admission medications   Medication Sig Start Date End Date Taking? Authorizing Provider  acetaminophen (TYLENOL) 500 MG tablet Take 1,000 mg by mouth every 6 (six) hours as needed for moderate pain.    [provider]  acyclovir (ZOVIRAX) 400 MG tablet Take 1 tablet (400 mg total) by mouth 2 (two) times daily. 12/30/21   Doreatha Massed, MD  apixaban (ELIQUIS) 5 MG TABS tablet Take 1 tablet (5 mg total) by mouth 2 (two) times daily. 08/05/22   Osvaldo Shipper, MD  APIXABAN Everlene Balls) VTE STARTER PACK (10MG  AND 5MG ) Take as directed on package: start with two-5mg  tablets twice daily for 7 days. On day 8, switch to one-5mg  tablet twice daily. 07/05/22   Osvaldo Shipper, MD  ASPIRIN LOW DOSE 81 MG chewable tablet Chew 81 mg by mouth daily. 11/29/22   [provider]  atorvastatin (LIPITOR) 40 MG tablet Take 40 mg by mouth at bedtime. 11/29/22   [provider]  dapagliflozin propanediol (FARXIGA) 10 MG TABS tablet Take 1 tablet (10 mg total) by mouth daily. 06/05/22   Burnadette Pop, MD  dexamethasone (DECADRON) 4 MG tablet Take 10 mg (2.5 tablets) by mouth weekly every Wednesday morning Per note by Dr. Ellin Saba on 09/15/2022 12/24/22   Doreatha Massed, MD  furosemide (LASIX) 40 MG tablet Take 40mg  as  needed if you gain more than 3 pounds in 1 day or 5 pounds in 1 week 07/05/22   Osvaldo Shipper, MD  lenalidomide (REVLIMID) 10 MG capsule Take 1 capsule (10 mg total) by mouth daily. Take for 21 days on, 7 days off. 12/10/22   Doreatha Massed, MD  lidocaine-prilocaine (EMLA) cream Apply 1 Application topically as needed. Patient not taking: Reported on 12/08/2022 09/17/22   Carnella Guadalajara, PA-C  losartan (COZAAR) 25 MG tablet Take 12.5 mg by mouth daily. 11/29/22   [provider]  metoprolol succinate (TOPROL-XL) 25 MG 24 hr tablet Take 0.5 tablets (12.5 mg total) by mouth daily. 06/05/22   Burnadette Pop, MD  potassium chloride SA (KLOR-CON M20) 20 MEQ tablet Take 1 tablet on the days you take lasix 07/05/22   Osvaldo Shipper, MD  spironolactone (ALDACTONE) 25 MG tablet Take 25 mg by mouth 2 (two) times daily. 10/14/22   [provider]      Allergies    Penicillins    Review of Systems   Review of Systems  Constitutional:  Negative for chills and fever.  Respiratory:  Negative for shortness of breath.   Cardiovascular:  Negative for chest pain.  Musculoskeletal:  Positive for myalgias.  Neurological:  Negative for numbness.    Physical Exam Updated Vital Signs BP (!) 151/63 (BP Location: Right Arm)   Pulse (!) 50  Temp 98.4 F (36.9 C) (Oral)   Resp 18   Ht 5\' 5"  (1.651 m)   Wt 57.6 kg   SpO2 96%   BMI 21.13 kg/m  Physical Exam Constitutional:      General: She is not in acute distress.    Appearance: She is not toxic-appearing.  Eyes:     General: No scleral icterus. Cardiovascular:     Rate and Rhythm: Bradycardia present.  Pulmonary:     Effort: Pulmonary effort is normal. No respiratory distress.  Musculoskeletal:        General: Tenderness present.       Legs:     Comments: Palpable round mass present at the marked area. Minimal increase in overlying warmth. No overlying erythema, induration, or fluctuance. Tender upon palpation. No  bruising noted. Legs appear symmetric in size and coloration. DP and PT pulses palpable. Chronic lower edema. Compartments soft. NV intact distally.   Skin:    General: Skin is warm and dry.  Neurological:     Mental Status: She is alert.     ED Results / Procedures / Treatments   Labs (all labs ordered are listed, but only abnormal results are displayed) Labs Reviewed  CBC WITH DIFFERENTIAL/PLATELET - Abnormal; Notable for the following components:      Result Value   RBC 3.48 (*)    Hemoglobin 10.5 (*)    HCT 32.7 (*)    RDW 18.7 (*)    nRBC 0.3 (*)    All other components within normal limits  BASIC METABOLIC PANEL - Abnormal; Notable for the following components:   Potassium 3.1 (*)    Creatinine, Ser 1.02 (*)    Calcium 8.3 (*)    GFR, Estimated 53 (*)    All other components within normal limits    EKG None  Radiology DG Tibia/Fibula Right  Result Date: 01/03/2023 CLINICAL DATA:  pain and swelling EXAM: RIGHT TIBIA AND FIBULA - 2 VIEW COMPARISON:  RIGHT lower extremity duplex, earlier same day. FINDINGS: There is no evidence of fracture or other focal bone lesion. Subcutaneous edema and small phleboliths at the RIGHT mid calf. Vascular calcifications. IMPRESSION: 1. No acute displaced fracture, dislocation or significant degenerative change within the RIGHT tibia or fibula. 2. Subcutaneous edema of the RIGHT mid calf soft tissues. Electronically Signed   By: Roanna Banning M.D.   On: 01/03/2023 15:02   US Venous Img Lower Unilateral Right  Result Date: 01/03/2023 CLINICAL DATA:  pain and swelling EXAM: RIGHT LOWER EXTREMITY VENOUS DOPPLER ULTRASOUND TECHNIQUE: Gray-scale sonography with compression, as well as color and duplex ultrasound, were performed to evaluate the deep venous system(s) from the level of the common femoral vein through the popliteal and proximal calf veins. COMPARISON:  CTA PE, 07/03/2022 FINDINGS: VENOUS Normal compressibility of the RIGHT common  femoral, superficial femoral, and popliteal veins, as well as the visualized calf veins. Visualized portions of profunda femoral vein and great saphenous vein unremarkable. No filling defects to suggest DVT on grayscale or color Doppler imaging. Doppler waveforms show normal direction of venous flow, normal respiratory plasticity and response to augmentation. Limited views of the contralateral common femoral vein are unremarkable. OTHER No evidence of superficial thrombophlebitis. At the medial RIGHT calf is an ill-defined, heterogeneously hypoechoic fluid subcutaneous collection with internal Doppler flow measuring approximately 4.3 x 1.5 x 3.2 cm. Limitations: none IMPRESSION: 1. No evidence of femoropopliteal DVT or superficial thrombophlebitis within the RIGHT lower extremity. 2. 4 cm RIGHT mid calf  subcutaneous mass versus collection with flow. This is incompletely assessed on this evaluation, correlate with history for potential recent trauma and if continued clinical concern consider contrasted MR for further evaluation Roanna Banning, MD [facility] Radiology Electronically Signed   By: Roanna Banning M.D.   On: 01/03/2023 12:57    Procedures Procedures   Medications Ordered in ED Medications  gadobutrol (GADAVIST) 1 MMOL/ML injection 6 mL (6 mLs Intravenous Contrast Given 01/03/23 1547)    ED Course/ Medical Decision Making/ A&P                               Medical Decision Making Amount and/or Complexity of Data Reviewed Labs: ordered. Radiology: ordered.  Risk Prescription drug management.   89 y.o. female presents to the ER for evaluation of right leg mass. Differential diagnosis includes but is not limited to mass, DVT, cellulitis, pseudoaneurysm, hematoma. Vital signs bradycardia with mildly elevated blood pressure 131/63 otherwise unremarkable. Physical exam as noted above.   On exam, patient does have a mass palpated to the medial  middle of the right lower leg.  There is very minimal increase in overlying warmth however there is no overlying erythema, fluctuance, or induration present.  No red streaking noted.  Legs are symmetric in size.  Palpable DP and PT pulses.  Compartments are soft.  Does have some chronic bilateral edema however.  Her sensations intact distally.  I have ordered her ultrasound and x-ray.  Patient is adamant that she did not hurt the leg or hit it on anything.  She reports that she woke on the middle night with the pain and it was relieved with Tylenol.  My attending assessed at bedside as well and agrees with ultrasound and x-ray.  Ultrasound 1. No evidence of femoropopliteal DVT or superficial thrombophlebitis within the RIGHT lower extremity. 2. 4 cm RIGHT mid calf subcutaneous mass versus collection with flow. This is incompletely assessed on this evaluation, correlate with history for potential recent trauma and if continued clinical concern consider contrasted MR for further evaluation . Per radiologist's interpretation.   XR 1. No acute displaced fracture, dislocation or significant degenerative change within the RIGHT tibia or fibula. 2. Subcutaneous edema of the RIGHT mid calf soft tissues. Per radiologist's interpretation.   I called the radiologist, Dr.Mugweru about this patient given that is has dopplerable flow to the area. He does not think this is a pseudoaneurysm.  He does recommend an MRI as this will give him better characterization and then a CT would.  Recommends MRI with and without contrast.  Does not recommend angio.  He thinks is more mass versus hematoma.  Given the acuteness of it, he thinks this is likely more hematoma.   Will order labs for MRI study.  I independently reviewed and interpreted the patient's labs.  CBC shows chronic anemia around baseline.  No leukocytosis.  BMP shows potassium at 3.1.  Creatinine at 1.02.  Calcium 8.3.  Appears around baseline. PO potassium ordered.    MRI pending. Will hand off to oncoming shift for follow up.   5:18 PM Care of Leslie Williamson  transferred to PA Maxwell Marion at the end of my shift as the patient will require reassessment once labs/imaging have resulted. Patient presentation, ED course, and plan of care discussed with review of all pertinent labs and imaging. Please see his/her note for further details regarding further ED course  and disposition. Plan at time of handoff is follow up with MRI. Disposition and treatment to be determined based on findings. This may be altered or completely changed at the discretion of the oncoming team pending results of further workup.  I discussed this case with my attending physician who cosigned this note including patient's presenting symptoms, physical exam, and planned diagnostics and interventions. Attending physician stated agreement with plan or made changes to plan which were implemented.   Attending physician assessed patient at bedside.  Portions of this report may have been transcribed using voice recognition software. Every effort was made to ensure accuracy; however, inadvertent computerized transcription errors may be present.   Final Clinical Impression(s) / ED Diagnoses Final diagnoses:  None    Rx / DC Orders ED Discharge Orders     None         Achille Rich, PA-C 01/03/23 1728    Rondel Baton, MD 01/08/23 [phone]

## 2023-01-03 NOTE — ED Notes (Signed)
Port access removed by other RN

## 2023-01-03 NOTE — ED Notes (Signed)
U/s completed at bedside

## 2023-01-03 NOTE — ED Notes (Signed)
Assisted pt onto stretcher Attached to partial monitor  Pt complains of a knot that is warm to touch that started this morning on the side of her RIGHT leg

## 2023-01-04 ENCOUNTER — Other Ambulatory Visit: Payer: Self-pay | Admitting: *Deleted

## 2023-01-04 ENCOUNTER — Other Ambulatory Visit: Payer: Self-pay

## 2023-01-04 ENCOUNTER — Telehealth (HOSPITAL_COMMUNITY): Payer: Self-pay | Admitting: *Deleted

## 2023-01-04 ENCOUNTER — Encounter: Payer: Self-pay | Admitting: General Practice

## 2023-01-04 DIAGNOSIS — M7989 Other specified soft tissue disorders: Secondary | ICD-10-CM

## 2023-01-04 DIAGNOSIS — R609 Edema, unspecified: Secondary | ICD-10-CM | POA: Diagnosis not present

## 2023-01-04 DIAGNOSIS — I5032 Chronic diastolic (congestive) heart failure: Secondary | ICD-10-CM | POA: Diagnosis not present

## 2023-01-04 DIAGNOSIS — Z23 Encounter for immunization: Secondary | ICD-10-CM | POA: Diagnosis not present

## 2023-01-04 DIAGNOSIS — C9002 Multiple myeloma in relapse: Secondary | ICD-10-CM | POA: Diagnosis not present

## 2023-01-04 MED ORDER — LENALIDOMIDE 10 MG PO CAPS: 10.0000 mg | ORAL_CAPSULE | Freq: Every day | ORAL | 0 refills | Status: DC

## 2023-01-04 NOTE — Telephone Encounter (Signed)
Patient's son Fayrene Fearing called in the clinic today asking when will Dr.Katragadda schedule pt's biopsy. Patient's son made aware he will be notified when the time comes for the biopsy. Pt's son verbalized understanding,

## 2023-01-04 NOTE — Progress Notes (Unsigned)
Leslie Mor, DO  [name]; [place] [facility] Procedure Requests Cc: [name], NT OK for US guided leg mass biopsy.  Would simply dictate angle of approach.  [address]       Previous Messages    ----- Message ----- From: [name] Sent: 01/04/2023  11:02 AM EST To: [name], NT; [name]; * Subject: CT US guided Biopsy                            Procedure: CT US Guided Biopsy  Reason : Enhancing subcutaneous mass anteromedially in the right lower leg Dx: Soft tissue mass [M79.89 (ICD-10-CM)   History: MR Tibia Fibula right w/wo , US venous lower right (DVT), xray  Provider: Doreatha Massed, MD  Provider contact : [phone]

## 2023-01-04 NOTE — Telephone Encounter (Signed)
Chart reviewed. Revlimid refilled per last office note with Dr. Katragadda.  

## 2023-01-05 ENCOUNTER — Inpatient Hospital Stay: Payer: Medicare Other

## 2023-01-05 ENCOUNTER — Inpatient Hospital Stay: Payer: Medicare Other | Attending: Hematology

## 2023-01-05 ENCOUNTER — Inpatient Hospital Stay: Payer: Medicare Other | Admitting: Hematology

## 2023-01-05 VITALS — BP 105/78 | HR 73 | Temp 97.3°F | Resp 17 | Wt 124.5 lb

## 2023-01-05 DIAGNOSIS — Z5112 Encounter for antineoplastic immunotherapy: Secondary | ICD-10-CM | POA: Diagnosis not present

## 2023-01-05 DIAGNOSIS — C9000 Multiple myeloma not having achieved remission: Secondary | ICD-10-CM | POA: Insufficient documentation

## 2023-01-05 LAB — CBC WITH DIFFERENTIAL/PLATELET
Abs Immature Granulocytes: 0.09 10*3/uL — ABNORMAL HIGH (ref 0.00–0.07)
Basophils Absolute: 0.1 10*3/uL (ref 0.0–0.1)
Basophils Relative: 1 %
Eosinophils Absolute: 0 10*3/uL (ref 0.0–0.5)
Eosinophils Relative: 0 %
HCT: 33.6 % — ABNORMAL LOW (ref 36.0–46.0)
Hemoglobin: 10.6 g/dL — ABNORMAL LOW (ref 12.0–15.0)
Immature Granulocytes: 1 %
Lymphocytes Relative: 9 %
Lymphs Abs: 1 10*3/uL (ref 0.7–4.0)
MCH: 29.5 pg (ref 26.0–34.0)
MCHC: 31.5 g/dL (ref 30.0–36.0)
MCV: 93.6 fL (ref 80.0–100.0)
Monocytes Absolute: 0.2 10*3/uL (ref 0.1–1.0)
Monocytes Relative: 2 %
Neutro Abs: 9.7 10*3/uL — ABNORMAL HIGH (ref 1.7–7.7)
Neutrophils Relative %: 87 %
Platelets: 406 10*3/uL — ABNORMAL HIGH (ref 150–400)
RBC: 3.59 MIL/uL — ABNORMAL LOW (ref 3.87–5.11)
RDW: 18.4 % — ABNORMAL HIGH (ref 11.5–15.5)
WBC: 11.1 10*3/uL — ABNORMAL HIGH (ref 4.0–10.5)
nRBC: 0.3 % — ABNORMAL HIGH (ref 0.0–0.2)

## 2023-01-05 MED ORDER — ACETAMINOPHEN 325 MG PO TABS
650.0000 mg | ORAL_TABLET | Freq: Once | ORAL | Status: AC
  Administered 2023-01-05: 650 mg via ORAL
  Filled 2023-01-05: qty 2

## 2023-01-05 MED ORDER — DARATUMUMAB-HYALURONIDASE-FIHJ 1800-30000 MG-UT/15ML ~~LOC~~ SOLN
1800.0000 mg | Freq: Once | SUBCUTANEOUS | Status: AC
  Administered 2023-01-05: 1800 mg via SUBCUTANEOUS
  Filled 2023-01-05: qty 15

## 2023-01-05 MED ORDER — HEPARIN SOD (PORK) LOCK FLUSH 100 UNIT/ML IV SOLN
500.0000 [IU] | Freq: Once | INTRAVENOUS | Status: AC
  Administered 2023-01-05: 500 [IU] via INTRAVENOUS

## 2023-01-05 MED ORDER — SODIUM CHLORIDE 0.9% FLUSH
10.0000 mL | INTRAVENOUS | Status: AC
  Administered 2023-01-05: 10 mL

## 2023-01-05 MED ORDER — CETIRIZINE HCL 10 MG PO TABS
10.0000 mg | ORAL_TABLET | Freq: Once | ORAL | Status: AC
  Administered 2023-01-05: 10 mg via ORAL
  Filled 2023-01-05: qty 1

## 2023-01-05 NOTE — Progress Notes (Signed)
Louann Liv presented for Portacath access and flush with labs for treatment.   Portacath located right chest wall accessed with  H 20 needle.  Good blood return present.Labs drawn.  Portacath flushed with 20ml NS and 500U/68ml Heparin and needle removed intact.  Procedure tolerated well and without incident. Discharged from port flush injection room 411 to waiting room by wheel chair in stable condition. Alert and oriented x 3. F/U with [facility] as scheduled.

## 2023-01-05 NOTE — Patient Instructions (Signed)
[place] [facility] - A DEPT OF [facility]  Discharge Instructions: Thank you for choosing [facility] to provide your oncology and hematology care.  If you have a lab appointment with the Cancer Center - please note that after April 8th, 2024, all labs will be drawn in the cancer center.  You do not have to check in or register with the main entrance as you have in the past but will complete your check-in in the cancer center.  Wear comfortable clothing and clothing appropriate for easy access to any Portacath or PICC line.   We strive to give you quality time with your provider. You may need to reschedule your appointment if you arrive late (15 or more minutes).  Arriving late affects you and other patients whose appointments are after yours.  Also, if you miss three or more appointments without notifying the office, you may be dismissed from the clinic at the provider's discretion.      For prescription refill requests, have your pharmacy contact our office and allow 72 hours for refills to be completed.    Today you received the following chemotherapy and/or immunotherapy agents Daratumumab      To help prevent nausea and vomiting after your treatment, we encourage you to take your nausea medication as directed.  BELOW ARE SYMPTOMS THAT SHOULD BE REPORTED IMMEDIATELY: *FEVER GREATER THAN 100.4 F (38 C) OR HIGHER *CHILLS OR SWEATING *NAUSEA AND VOMITING THAT IS NOT CONTROLLED WITH YOUR NAUSEA MEDICATION *UNUSUAL SHORTNESS OF BREATH *UNUSUAL BRUISING OR BLEEDING *URINARY PROBLEMS (pain or burning when urinating, or frequent urination) *BOWEL PROBLEMS (unusual diarrhea, constipation, pain near the anus) TENDERNESS IN MOUTH AND THROAT WITH OR WITHOUT PRESENCE OF ULCERS (sore throat, sores in mouth, or a toothache) UNUSUAL RASH, SWELLING OR PAIN  UNUSUAL VAGINAL DISCHARGE OR ITCHING   Items with * indicate a potential emergency and should be followed  up as soon as possible or go to the Emergency Department if any problems should occur.  Please show the CHEMOTHERAPY ALERT CARD or IMMUNOTHERAPY ALERT CARD at check-in to the Emergency Department and triage nurse.  Should you have questions after your visit or need to cancel or reschedule your appointment, please contact [place] [facility] - A DEPT OF [facility] [phone]  and follow the prompts.  Office hours are 8:00 a.m. to 4:30 p.m. Monday - Friday. Please note that voicemails left after 4:00 p.m. may not be returned until the following business day.  We are closed weekends and major holidays. You have access to a nurse at all times for urgent questions. Please call the main number to the clinic [phone] and follow the prompts.  For any non-urgent questions, you may also contact your provider using MyChart. We now offer e-Visits for anyone 42 and older to request care online for non-urgent symptoms. For details visit [email or web address].   Also download the MyChart app! Go to the app store, search "MyChart", open the app, select [place], and log in with your MyChart username and password.

## 2023-01-05 NOTE — Progress Notes (Signed)
Patient presents today for Daratumumab injection per providers order.  Vital signs and labs within parameters for treatment.  Patient has no new complaints at this time.  Stable during administration without incident; injection site WNL; see MAR for injection details.  Patient tolerated procedure well and without incident.  No questions or complaints noted at this time.

## 2023-01-13 ENCOUNTER — Other Ambulatory Visit (HOSPITAL_COMMUNITY): Payer: Self-pay

## 2023-01-13 NOTE — Progress Notes (Unsigned)
Osvaldo Shipper, MD  [name] Cc: Vergia Alcon, RN      Previous Messages    ----- Message ----- From: Vergia Alcon, RN Sent: 01/13/2023  12:53 PM EST To: Osvaldo Shipper, MD Subject: RE: request to Hold Eliquis                    Okay to hold Eliquis prior to procedure. ----- Message ----- From: Osvaldo Shipper, MD Sent: 01/12/2023  11:55 AM EST To: Doreatha Massed, MD; [name] Subject: FW: request to Hold Eliquis                    Hi Dr. Kirtland Bouchard. Please address the query below. Thanks ----- Message ----- From: [name] Sent: 01/05/2023  12:58 PM EST To: Osvaldo Shipper, MD; [name] Subject: request to Hold Eliquis                        Hi Dr. Rito Ehrlich,  Patient is scheduled for a biopsy on 01/19/23. Patient will need to hold Eliquis 48 hours prior to procedure and we just wanted to get the okay.   Thanks, Yitzel Shasteen X

## 2023-01-17 ENCOUNTER — Other Ambulatory Visit: Payer: Self-pay | Admitting: Urology

## 2023-01-17 DIAGNOSIS — Z01818 Encounter for other preprocedural examination: Secondary | ICD-10-CM

## 2023-01-18 NOTE — Progress Notes (Shared)
Chief Complaint: Patient was seen in consultation today for core biopsy of an enhancing subcutaneous mass anteromedially in the right lower leg, at the request of Dr. Doreatha Massed  Supervising Physician: Roanna Banning  Patient Status: [facility] - Out-pt  History of Present Illness: Leslie Williamson is a 89 y.o. female   Questions for Most Recent Historical Code Status  Question Answer  If patient has no pulse and is not breathing Do Not Attempt Resuscitation  If patient has a pulse and/or is breathing: Medical Treatment Goals LIMITED ADDITIONAL INTERVENTIONS: Use medication/IV fluids and cardiac monitoring as indicated; Do not use intubation or mechanical ventilation (DNI), also provide comfort medications.  Transfer to Progressive/Stepdown as indicated, avoid Intensive Care.   Leslie Williamson is a 89 y.o. female with Hx of Multiple Myeloma, in remission, CHF, on Eliquis for PE previously. She presented to the ER on 11/11 for evlaution of right leg mass. She reported that she woke up in the middle of the night with her leg hurting. No fall or trauma to the area. On Eliquis. An MRI was obtained.  MR Tibia fibula Right w/ & w/o: IMPRESSION: 1. Enhancing subcutaneous mass anteromedially in the right lower leg as described, most consistent with a soft tissue neoplasm (possibly sarcoma or lymphoma). Tissue sampling recommended. 2. No other soft tissue masses or enlarged lymph nodes are identified. No evidence of right lower leg abscess. 3. No acute osseous findings or evidence of osteomyelitis. 4. Nonspecific subcutaneous edema within both lower legs.  Dr. Ellin Saba was consulted, and recommended for outpatient follow up. Patient was discharged.  IR was requested for core biopsy of enhancing subcutaneous mass anteromedially in the right lower leg. Request was reviewed and approved by Dr. Loreta Ave. Patient is scheduled for same in IR today.   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   Vertigo, labyrinthine     Past Surgical History:  Procedure Laterality Date   CATARACT EXTRACTION W/PHACO Left 11/03/2015   Procedure: CATARACT EXTRACTION PHACO AND INTRAOCULAR LENS PLACEMENT (IOC);  Surgeon: Gemma Payor, MD;  Location: [facility];  Service: Ophthalmology;  Laterality: Left;  CDE: 7.74   CATARACT EXTRACTION W/PHACO Right 11/20/2015   Procedure: CATARACT EXTRACTION PHACO AND INTRAOCULAR LENS PLACEMENT ; CDE:  8.30;  Surgeon: Gemma Payor, MD;  Location: [facility];  Service: Ophthalmology;  Laterality: Right;   IR IMAGING GUIDED PORT INSERTION  11/04/2021   PORT-A-CATH REMOVAL      Allergies: Penicillins  Medications: Prior to Admission medications   Medication Sig Start Date End Date Taking? Authorizing Provider  acetaminophen (TYLENOL) 500 MG tablet Take 1,000 mg by mouth every 6 (six) hours as needed for moderate pain.    [provider]  acyclovir (ZOVIRAX) 400 MG tablet Take 1 tablet (400 mg total) by mouth 2 (  two) times daily. 12/30/21   Doreatha Massed, MD  apixaban (ELIQUIS) 5 MG TABS tablet Take 1 tablet (5 mg total) by mouth 2 (two) times daily. 08/05/22   Osvaldo Shipper, MD  APIXABAN Everlene Balls) VTE STARTER PACK (10MG  AND 5MG ) Take as directed on package: start with two-5mg  tablets twice daily for 7 days. On day 8, switch to one-5mg  tablet twice daily. 07/05/22    Osvaldo Shipper, MD  ASPIRIN LOW DOSE 81 MG chewable tablet Chew 81 mg by mouth daily. 11/29/22   [provider]  atorvastatin (LIPITOR) 40 MG tablet Take 40 mg by mouth at bedtime. 11/29/22   [provider]  dapagliflozin propanediol (FARXIGA) 10 MG TABS tablet Take 1 tablet (10 mg total) by mouth daily. 06/05/22   Burnadette Pop, MD  dexamethasone (DECADRON) 4 MG tablet Take 10 mg (2.5 tablets) by mouth weekly every Wednesday morning Per note by Dr. Ellin Saba on 09/15/2022 12/24/22   Doreatha Massed, MD  furosemide (LASIX) 40 MG tablet Take 40mg  as needed if you gain more than 3 pounds in 1 day or 5 pounds in 1 week 07/05/22   Osvaldo Shipper, MD  lenalidomide (REVLIMID) 10 MG capsule Take 1 capsule (10 mg total) by mouth daily. Take for 21 days on, 7 days off. 01/04/23   Doreatha Massed, MD  lidocaine-prilocaine (EMLA) cream Apply 1 Application topically as needed. 09/17/22   Carnella Guadalajara, PA-C  losartan (COZAAR) 25 MG tablet Take 12.5 mg by mouth daily. 11/29/22   [provider]  metoprolol succinate (TOPROL-XL) 25 MG 24 hr tablet Take 0.5 tablets (12.5 mg total) by mouth daily. 06/05/22   Burnadette Pop, MD  potassium chloride SA (KLOR-CON M20) 20 MEQ tablet Take 1 tablet on the days you take lasix 07/05/22   Osvaldo Shipper, MD  spironolactone (ALDACTONE) 25 MG tablet Take 25 mg by mouth 2 (two) times daily. 10/14/22   [provider]     Family History  Problem Relation Age of Onset   Diabetes Mother     Social History   Socioeconomic History   Marital status: Widowed    Spouse name: Not on file   Number of children: Not on file   Years of education: Not on file   Highest education level: Not on file  Occupational History   Not on file  Tobacco Use   Smoking status: Former   Smokeless tobacco: Never  Vaping Use   Vaping status: Never Used  Substance and Sexual Activity   Alcohol use: No   Drug use: No   Sexual activity: Not  Currently  Other Topics Concern   Not on file  Social History Narrative   Not on file   Social Determinants of Health   Financial Resource Strain: Low Risk  (12/31/2019)   Overall Financial Resource Strain (CARDIA)    Difficulty of Paying Living Expenses: Not hard at all  Food Insecurity: No Food Insecurity (07/03/2022)   Hunger Vital Sign    Worried About Running Out of Food in the Last Year: Never true    Ran Out of Food in the Last Year: Never true  Transportation Needs: No Transportation Needs (07/03/2022)   PRAPARE - Administrator, Civil Service (Medical): No    Lack of Transportation (Non-Medical): No  Physical Activity: Inactive (12/31/2019)   Exercise Vital Sign    Days of Exercise per Week: 0 days    Minutes of Exercise per Session: 0 min  Stress: No Stress Concern  Present (12/31/2019)   [employer] of Occupational Health - Occupational Stress Questionnaire    Feeling of Stress : Not at all  Social Connections: Moderately Isolated (12/31/2019)   Social Connection and Isolation Panel [NHANES]    Frequency of Communication with Friends and Family: More than three times a week    Frequency of Social Gatherings with Friends and Family: More than three times a week    Attends Religious Services: More than 4 times per year    Active Member of [employer] or Organizations: No    Attends Banker Meetings: Never    Marital Status: Widowed   Review of Systems: A 12 point ROS discussed and pertinent positives are indicated in the HPI above.  All other systems are negative.  Review of Systems  Vital Signs: There were no vitals taken for this visit.  Advance Care Plan: {Advance Care [id number]}    Physical Exam  Imaging: MR TIBIA FIBULA RIGHT W WO CONTRAST  Result Date: 01/03/2023 CLINICAL DATA:  Soft tissue infection suspected. Mass versus a traumatic hematoma anteriorly in the lower leg on ultrasound. EXAM: MRI OF LOWER RIGHT EXTREMITY WITHOUT AND  WITH CONTRAST TECHNIQUE: Multiplanar, multisequence MR imaging of the right lower leg was performed both before and after administration of intravenous contrast. CONTRAST:  6mL GADAVIST GADOBUTROL 1 MMOL/ML IV SOLN COMPARISON:  Radiographs and Doppler ultrasound same date. No other relevant comparison imaging. FINDINGS: Bones/Joint/Cartilage The left lower leg is included on the coronal images. There is no evidence of acute fracture, dislocation or aggressive osseous lesion in either lower leg. The right tibia and fibula appear unremarkable. Mild degenerative changes noted at both knees without significant joint effusions. Ligaments Not relevant for exam/indication. No significant ligamentous abnormalities are seen. Muscles and Tendons No focal muscular atrophy, edema or suspicious enhancement. There is low-level enhancement within the peroneal and soleus musculature which is nonspecific. The patellar tendon appears normal. The visualized ankle tendons are intact without tenosynovitis, although their distal insertions are incompletely visualized. Soft tissues As seen on earlier ultrasound, there is a subcutaneous mass anteromedially in the right lower leg, measuring approximately 4.5 x 3.2 x 1.7 cm. This demonstrates homogeneous T1 signal isointense to muscle, T2 signal mildly hyperintense to muscle, and intense uniform enhancement following contrast. No other enhancing soft tissue masses are identified. There is nonspecific subcutaneous edema within both lower legs which appears fairly symmetric. No focal fluid collections, enlarged lymph nodes or vascular abnormalities are identified. IMPRESSION: 1. Enhancing subcutaneous mass anteromedially in the right lower leg as described, most consistent with a soft tissue neoplasm (possibly sarcoma or lymphoma). Tissue sampling recommended. 2. No other soft tissue masses or enlarged lymph nodes are identified. No evidence of right lower leg abscess. 3. No acute osseous  findings or evidence of osteomyelitis. 4. Nonspecific subcutaneous edema within both lower legs. Electronically Signed   By: Carey Bullocks M.D.   On: 01/03/2023 18:54   DG Tibia/Fibula Right  Result Date: 01/03/2023 CLINICAL DATA:  pain and swelling EXAM: RIGHT TIBIA AND FIBULA - 2 VIEW COMPARISON:  RIGHT lower extremity duplex, earlier same day. FINDINGS: There is no evidence of fracture or other focal bone lesion. Subcutaneous edema and small phleboliths at the RIGHT mid calf. Vascular calcifications. IMPRESSION: 1. No acute displaced fracture, dislocation or significant degenerative change within the RIGHT tibia or fibula. 2. Subcutaneous edema of the RIGHT mid calf soft tissues. Electronically Signed   By: Roanna Banning M.D.   On: 01/03/2023 15:02  US Venous Img Lower Unilateral Right  Result Date: 01/03/2023 CLINICAL DATA:  pain and swelling EXAM: RIGHT LOWER EXTREMITY VENOUS DOPPLER ULTRASOUND TECHNIQUE: Gray-scale sonography with compression, as well as color and duplex ultrasound, were performed to evaluate the deep venous system(s) from the level of the common femoral vein through the popliteal and proximal calf veins. COMPARISON:  CTA PE, 07/03/2022 FINDINGS: VENOUS Normal compressibility of the RIGHT common femoral, superficial femoral, and popliteal veins, as well as the visualized calf veins. Visualized portions of profunda femoral vein and great saphenous vein unremarkable. No filling defects to suggest DVT on grayscale or color Doppler imaging. Doppler waveforms show normal direction of venous flow, normal respiratory plasticity and response to augmentation. Limited views of the contralateral common femoral vein are unremarkable. OTHER No evidence of superficial thrombophlebitis. At the medial RIGHT calf is an ill-defined, heterogeneously hypoechoic fluid subcutaneous collection with internal Doppler flow measuring approximately 4.3 x 1.5 x 3.2 cm. Limitations: none IMPRESSION: 1. No  evidence of femoropopliteal DVT or superficial thrombophlebitis within the RIGHT lower extremity. 2. 4 cm RIGHT mid calf subcutaneous mass versus collection with flow. This is incompletely assessed on this evaluation, correlate with history for potential recent trauma and if continued clinical concern consider contrasted MR for further evaluation Roanna Banning, MD Vascular and Interventional Radiology Specialists [facility] Radiology Electronically Signed   By: Roanna Banning M.D.   On: 01/03/2023 12:57    Labs:  CBC: Recent Labs    11/10/22 1314 12/08/22 1200 01/03/23 1438 01/05/23 1225  WBC 6.6 6.6 8.7 11.1*  HGB 11.3* 11.0* 10.5* 10.6*  HCT 35.4* 33.8* 32.7* 33.6*  PLT 307 345 400 406*    COAGS: No results for input(s): "INR", "APTT" in the last 8760 hours.  BMP: Recent Labs    09/15/22 1133 11/10/22 1314 12/08/22 1208 01/03/23 1438  NA 138 139 138 139  K 3.7 3.2* 3.7 3.1*  CL 108 108 106 108  CO2 22 22 24 24   GLUCOSE 101* 151* 134* 79  BUN 35* 19 17 14   CALCIUM 8.7* 8.6* 8.2* 8.3*  CREATININE 1.47* 1.33* 1.34* 1.02*  GFRNONAA 34* 38* 38* 53*    LIVER FUNCTION TESTS: Recent Labs    08/18/22 1412 09/15/22 1133 11/10/22 1314 12/08/22 1208  BILITOT 0.7 0.4 0.9 0.7  AST 11* 12* 14* 14*  ALT 10 12 13 15   ALKPHOS 49 45 50 56  PROT 5.5* 5.5* 5.4* 5.3*  ALBUMIN 3.2* 2.9* 3.0* 3.1*    TUMOR MARKERS: No results for input(s): "AFPTM", "CEA", "CA199", "CHROMGRNA" in the last 8760 hours.  Assessment and Plan:  Patient has a Hx of Multiple Myeloma, in remission. She presented to the ER on 11/11 for evlaution of right leg mass. An MRI was obtained, notable for an enhancing subcutaneous mass, most likely a soft tissue neoplasm. Soft tissue sample was recommended. Dr. Ellin Saba was consulted, and recommended for outpatient follow up. Patient was discharged.  IR was requested for core biopsy of enhancing subcutaneous mass anteromedially in the right lower leg.  Patient  presents for same in IR today.  Risks and benefits of enhancing subcutaneous mass  was discussed with the patient and/or patient's family including, but not limited to bleeding, infection, damage to adjacent structures or low yield requiring additional tests.  All of the questions were answered and there is agreement to proceed.  Consent signed and in chart.   Thank you for this interesting consult.  I greatly enjoyed meeting ELLSA CEBULSKI and look  forward to participating in their care.  A copy of this report was sent to the requesting provider on this date.  Electronically Signed: Sable Feil, PA-C 01/18/2023, 3:53 PM   I spent a total of  30 Minutes   in face to face in clinical consultation, greater than 50% of which was counseling/coordinating care for enhancing subcutaneous mass anteromedially in the right lower leg

## 2023-01-19 ENCOUNTER — Telehealth: Payer: Self-pay

## 2023-01-19 ENCOUNTER — Ambulatory Visit (HOSPITAL_COMMUNITY)
Admission: RE | Admit: 2023-01-19 | Discharge: 2023-01-19 | Disposition: A | Discharge status: Home / Self Care | Payer: Medicare Other | Source: Ambulatory Visit | Attending: Hematology | Admitting: Hematology

## 2023-01-19 DIAGNOSIS — R2241 Localized swelling, mass and lump, right lower limb: Secondary | ICD-10-CM | POA: Diagnosis not present

## 2023-01-19 DIAGNOSIS — M7989 Other specified soft tissue disorders: Secondary | ICD-10-CM | POA: Insufficient documentation

## 2023-01-19 DIAGNOSIS — C4921 Malignant neoplasm of connective and soft tissue of right lower limb, including hip: Secondary | ICD-10-CM | POA: Diagnosis not present

## 2023-01-19 MED ORDER — LIDOCAINE HCL 1 % IJ SOLN
INTRAMUSCULAR | Status: AC
  Filled 2023-01-19: qty 20

## 2023-01-19 NOTE — Telephone Encounter (Signed)
Transition Care Management Follow-up Telephone Call Date of discharge and from where: 01/03/2023 [facility] How have you been since you were released from the hospital? Patient stated she is feeling much better. Any questions or concerns? No  Items Reviewed: Did the pt receive and understand the discharge instructions provided? Yes  Medications obtained and verified?  No medication prescribed this visit. Other? No  Any new allergies since your discharge? No  Dietary orders reviewed? Yes Do you have support at home? Yes   Follow up appointments reviewed:  PCP Hospital f/u appt confirmed? No  Scheduled to see  on  @ . Specialist Hospital f/u appt confirmed? Yes  Scheduled to see Doreatha Massed, MD on 01/19/2023 @ [facility] US Biopsy [facility]. Are transportation arrangements needed? No  If their condition worsens, is the pt aware to call PCP or go to the Emergency Dept.? Yes Was the patient provided with contact information for the PCP's office or ED? Yes Was to pt encouraged to call back with questions or concerns? Yes   Leslie Williamson [facility]  [facility] Guide Direct Dial: [phone]  Website: [email or web address]

## 2023-01-19 NOTE — Procedures (Signed)
Vascular and Interventional Radiology Procedure Note  Patient: Leslie Williamson DOB: 10-31-1931 Medical Record Number: [id number] Note Date/Time: 01/19/23 1:04 PM   Performing Physician: Roanna Banning, MD Assistant(s): None  Diagnosis: RLE SQ mass.   Procedure: RIGHT LOWER EXTREMITY SUBCUTANEOUS MASS BIOPSY  Anesthesia: Local Anesthetic Complications: None Estimated Blood Loss: Minimal Specimens: Sent for Pathology  Findings:  Successful Ultrasound-guided biopsy of RLE SQ mass. A total of 4 samples were obtained. Hemostasis of the tract was achieved using Manual Pressure.  Plan: Bed rest for 0 hours.  See detailed procedure note with images in PACS. The patient tolerated the procedure well without incident or complication and was returned to Recovery in stable condition.    Roanna Banning, MD Vascular and Interventional Radiology Specialists [facility] - Inpatient Radiology   Pager. [phone] Clinic. [phone]

## 2023-01-25 DIAGNOSIS — R2243 Localized swelling, mass and lump, lower limb, bilateral: Secondary | ICD-10-CM | POA: Diagnosis not present

## 2023-01-25 DIAGNOSIS — R2241 Localized swelling, mass and lump, right lower limb: Secondary | ICD-10-CM | POA: Diagnosis not present

## 2023-01-27 ENCOUNTER — Telehealth: Payer: Self-pay | Admitting: Medical Oncology

## 2023-01-27 ENCOUNTER — Other Ambulatory Visit: Payer: Self-pay

## 2023-01-27 NOTE — Telephone Encounter (Signed)
err

## 2023-01-27 NOTE — Telephone Encounter (Deleted)
Hospitalized - trouble breathing.

## 2023-01-28 LAB — SURGICAL PATHOLOGY

## 2023-01-31 ENCOUNTER — Encounter: Payer: Self-pay | Admitting: Hematology

## 2023-02-02 ENCOUNTER — Inpatient Hospital Stay: Payer: Medicare Other | Attending: Hematology

## 2023-02-02 ENCOUNTER — Inpatient Hospital Stay: Payer: Medicare Other

## 2023-02-02 DIAGNOSIS — C9000 Multiple myeloma not having achieved remission: Secondary | ICD-10-CM

## 2023-02-02 DIAGNOSIS — Z5112 Encounter for antineoplastic immunotherapy: Secondary | ICD-10-CM | POA: Diagnosis not present

## 2023-02-02 LAB — CBC WITH DIFFERENTIAL/PLATELET
Abs Immature Granulocytes: 0.06 10*3/uL (ref 0.00–0.07)
Basophils Absolute: 0.1 10*3/uL (ref 0.0–0.1)
Basophils Relative: 1 %
Eosinophils Absolute: 0 10*3/uL (ref 0.0–0.5)
Eosinophils Relative: 0 %
HCT: 31.4 % — ABNORMAL LOW (ref 36.0–46.0)
Hemoglobin: 10 g/dL — ABNORMAL LOW (ref 12.0–15.0)
Immature Granulocytes: 1 %
Lymphocytes Relative: 30 %
Lymphs Abs: 2.4 10*3/uL (ref 0.7–4.0)
MCH: 28.6 pg (ref 26.0–34.0)
MCHC: 31.8 g/dL (ref 30.0–36.0)
MCV: 89.7 fL (ref 80.0–100.0)
Monocytes Absolute: 1 10*3/uL (ref 0.1–1.0)
Monocytes Relative: 12 %
Neutro Abs: 4.5 10*3/uL (ref 1.7–7.7)
Neutrophils Relative %: 56 %
Platelets: 416 10*3/uL — ABNORMAL HIGH (ref 150–400)
RBC: 3.5 MIL/uL — ABNORMAL LOW (ref 3.87–5.11)
RDW: 18.6 % — ABNORMAL HIGH (ref 11.5–15.5)
WBC: 8 10*3/uL (ref 4.0–10.5)
nRBC: 0.4 % — ABNORMAL HIGH (ref 0.0–0.2)

## 2023-02-02 MED ORDER — ACETAMINOPHEN 325 MG PO TABS
650.0000 mg | ORAL_TABLET | Freq: Once | ORAL | Status: AC
  Administered 2023-02-02: 650 mg via ORAL
  Filled 2023-02-02: qty 2

## 2023-02-02 MED ORDER — DARATUMUMAB-HYALURONIDASE-FIHJ 1800-30000 MG-UT/15ML ~~LOC~~ SOLN
1800.0000 mg | Freq: Once | SUBCUTANEOUS | Status: AC
  Administered 2023-02-02: 1800 mg via SUBCUTANEOUS
  Filled 2023-02-02: qty 15

## 2023-02-02 MED ORDER — SODIUM CHLORIDE 0.9% FLUSH
10.0000 mL | Freq: Once | INTRAVENOUS | Status: AC
  Administered 2023-02-02: 10 mL via INTRAVENOUS

## 2023-02-02 MED ORDER — CETIRIZINE HCL 10 MG PO TABS
10.0000 mg | ORAL_TABLET | Freq: Once | ORAL | Status: AC
  Administered 2023-02-02: 10 mg via ORAL
  Filled 2023-02-02: qty 1

## 2023-02-02 MED ORDER — HEPARIN SOD (PORK) LOCK FLUSH 100 UNIT/ML IV SOLN
500.0000 [IU] | Freq: Once | INTRAVENOUS | Status: AC
  Administered 2023-02-02: 500 [IU] via INTRAVENOUS

## 2023-02-02 NOTE — Patient Instructions (Signed)
 [facility] - [facility] DE[facility] OF [facility]  Discharge Instructions: Thank you for choosing [facility] to provide your oncology and hematology care.  If you have a lab appointment with the Cancer Center - please note that after April 8th, 2024, all labs will be drawn in the cancer center.  You do not have to check in or register with the main entrance as you have in the past but will complete your check-in in the cancer center.  Wear comfortable clothing and clothing appropriate for easy access to any Portacath or PICC line.   We strive to give you quality time with your provider. You may need to reschedule your appointment if you arrive late (15 or more minutes).  Arriving late affects you and other patients whose appointments are after yours.  Also, if you miss three or more appointments without notifying the office, you may be dismissed from the clinic at the provider's discretion.      For prescription refill requests, have your pharmacy contact our office and allow 72 hours for refills to be completed.    Today you received the following chemotherapy and/or immunotherapy agents: Daratumumab       To help prevent nausea and vomiting after your treatment, we encourage you to take your nausea medication as directed.  BELOW ARE SYMPTOMS THAT SHOULD BE REPORTED IMMEDIATELY: *FEVER GREATER THAN 100.4 F (38 C) OR HIGHER *CHILLS OR SWEATING *NAUSEA AND VOMITING THAT IS NOT CONTROLLED WITH YOUR NAUSEA MEDICATION *UNUSUAL SHORTNESS OF BREATH *UNUSUAL BRUISING OR BLEEDING *URINARY PROBLEMS (pain or burning when urinating, or frequent urination) *BOWEL PROBLEMS (unusual diarrhea, constipation, pain near the anus) TENDERNESS IN MOUTH AND THROAT WITH OR WITHOUT PRESENCE OF ULCERS (sore throat, sores in mouth, or a toothache) UNUSUAL RASH, SWELLING OR PAIN  UNUSUAL VAGINAL DISCHARGE OR ITCHING   Items with * indicate a potential emergency and should be  followed up as soon as possible or go to the Emergency Department if any problems should occur.  Please show the CHEMOTHERAPY ALERT CARD or IMMUNOTHERAPY ALERT CARD at check-in to the Emergency Department and triage nurse.  Should you have questions after your visit or need to cancel or reschedule your appointment, please contact [facility] [place] [facility] - A DEPT OF [facility] [phone]  and follow the prompts.  Office hours are 8:00 a.m. to 4:30 p.m. Monday - Friday. Please note that voicemails left after 4:00 p.m. may not be returned until the following business day.  We are closed weekends and major holidays. You have access to a nurse at all times for urgent questions. Please call the main number to the clinic [phone] and follow the prompts.  For any non-urgent questions, you may also contact your provider using MyChart. We now offer e-Visits for anyone 71 and older to request care online for non-urgent symptoms. For details visit [email or web address].   Also download the MyChart app! Go to the app store, search "MyChart", open the app, select [place], and log in with your MyChart username and password.

## 2023-02-02 NOTE — Progress Notes (Signed)
Patient presents today for Daratumumab injection per providers order.  Vital signs within parameters for treatment.  Labs pending.  Patient has no new complaints at this time.  Labs within parameters for treatment.  Stable during administration without incident; injection site WNL; see MAR for injection details.  Patient tolerated procedure well and without incident.  No questions or complaints noted at this time. Discharge from clinic via wheelchair in stable condition.  Alert and oriented X 3.  Follow up with [facility] as scheduled.

## 2023-02-03 ENCOUNTER — Other Ambulatory Visit: Payer: Self-pay

## 2023-02-03 MED ORDER — LENALIDOMIDE 10 MG PO CAPS: 10.0000 mg | ORAL_CAPSULE | Freq: Every day | ORAL | 0 refills | Status: DC

## 2023-02-03 NOTE — Telephone Encounter (Signed)
Chart reviewed. Revlimid refilled per last office note with Dr. Katragadda.  

## 2023-02-08 ENCOUNTER — Encounter: Payer: Self-pay | Admitting: *Deleted

## 2023-02-08 ENCOUNTER — Telehealth: Payer: Self-pay | Admitting: Internal Medicine

## 2023-02-08 ENCOUNTER — Encounter: Payer: Self-pay | Admitting: General Surgery

## 2023-02-08 ENCOUNTER — Ambulatory Visit (INDEPENDENT_AMBULATORY_CARE_PROVIDER_SITE_OTHER): Payer: Medicare Other | Admitting: General Surgery

## 2023-02-08 VITALS — BP 128/62 | HR 68 | Temp 97.1°F | Resp 12 | Ht 65.0 in | Wt 118.0 lb

## 2023-02-08 DIAGNOSIS — C9000 Multiple myeloma not having achieved remission: Secondary | ICD-10-CM

## 2023-02-08 DIAGNOSIS — R2241 Localized swelling, mass and lump, right lower limb: Secondary | ICD-10-CM | POA: Diagnosis not present

## 2023-02-08 DIAGNOSIS — C969 Malignant neoplasm of lymphoid, hematopoietic and related tissue, unspecified: Secondary | ICD-10-CM | POA: Diagnosis not present

## 2023-02-08 NOTE — Progress Notes (Unsigned)
[facility] History and Physical  Reason for Referral:*** Referring Physician: ***  Chief Complaint   New Patient (Initial Visit)     Leslie Williamson is a 89 y.o. female.  HPI: ***.  The *** started *** and has had a duration of ***.  It is associated with ***.  The *** is improved with ***, and is made worse with ***.    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   Vertigo, labyrinthine     Past Surgical History:  Procedure Laterality Date   CATARACT EXTRACTION W/PHACO Left 11/03/2015   Procedure: CATARACT EXTRACTION PHACO AND INTRAOCULAR LENS PLACEMENT (IOC);  Surgeon: Gemma Payor, MD;  Location: [facility];  Service: Ophthalmology;  Laterality: Left;  CDE: 7.74   CATARACT EXTRACTION W/PHACO Right 11/20/2015   Procedure: CATARACT EXTRACTION PHACO AND INTRAOCULAR LENS PLACEMENT ; CDE:  8.30;  Surgeon: Gemma Payor, MD;  Location: [facility];  Service: Ophthalmology;  Laterality: Right;   IR IMAGING GUIDED PORT INSERTION  11/04/2021   PORT-A-CATH REMOVAL      Family History  Problem Relation Age of Onset   Diabetes Mother     Social History   Tobacco Use   Smoking status: Former   Smokeless tobacco: Never  Vaping Use   Vaping status: Never Used  Substance Use Topics   Alcohol use: No   Drug use: No    Medications: {medication  reviewed/display:3041432} Allergies as of 02/08/2023       Reactions   Penicillins Rash        Medication List        Accurate as of February 08, 2023  9:31 AM. If you have any questions, ask your nurse or doctor.          acetaminophen 500 MG tablet Commonly known as: TYLENOL Take 1,000 mg by mouth every 6 (six) hours as needed for moderate pain.   acyclovir 400 MG tablet Commonly known as: ZOVIRAX Take 1 tablet (400 mg total) by mouth 2 (two) times daily.   Aspirin Low Dose 81 MG chewable tablet Generic drug: aspirin Chew 81 mg by mouth daily.   atorvastatin 40 MG tablet Commonly known as: LIPITOR Take 40 mg by mouth at bedtime.   dapagliflozin propanediol 10 MG Tabs tablet Commonly known as: FARXIGA Take 1 tablet (10 mg total) by mouth daily.   dexamethasone 4 MG tablet Commonly known as: DECADRON Take 10 mg (2.5 tablets) by mouth weekly every Wednesday morning Per note by Dr. Ellin Saba on 09/15/2022   Eliquis DVT/PE Starter Pack Generic drug: Apixaban Starter Pack (10mg  and 5mg ) Take as directed on package: start with two-5mg  tablets twice daily for 7 days. On day 8, switch to one-5mg  tablet twice daily.   Eliquis 5 MG Tabs tablet Generic drug:  apixaban Take 1 tablet (5 mg total) by mouth 2 (two) times daily.   furosemide 40 MG tablet Commonly known as: LASIX Take 40mg  as needed if you gain more than 3 pounds in 1 day or 5 pounds in 1 week   lenalidomide 10 MG capsule Commonly known as: REVLIMID Take 1 capsule (10 mg total) by mouth daily. Take for 21 days on, 7 days off.   lidocaine-prilocaine cream Commonly known as: EMLA Apply 1 Application topically as needed.   losartan 25 MG tablet Commonly known as: COZAAR Take 12.5 mg by mouth daily.   metoprolol succinate 25 MG 24 hr tablet Commonly known as: TOPROL-XL Take 0.5 tablets (12.5 mg total) by mouth daily.   potassium chloride SA 20 MEQ tablet Commonly known as: Klor-Con M20 Take 1  tablet on the days you take lasix   spironolactone 25 MG tablet Commonly known as: ALDACTONE Take 25 mg by mouth 2 (two) times daily.         ROS:  {Review of Systems:30496}  Blood pressure 128/62, pulse 68, temperature (!) 97.1 F (36.2 C), temperature source Oral, resp. rate 12, height 5\' 5"  (1.651 m), weight 118 lb (53.5 kg), SpO2 94%. Physical Exam  Results: No results found for this or any previous visit (from the past 48 hours).  No results found.   Assessment & Plan:  Leslie Williamson is a 89 y.o. female with *** -*** -*** -Follow up ***  All questions were answered to the satisfaction of the patient and family***.  The risk and benefits of *** were discussed including but not limited to ***.  After careful consideration, Leslie Williamson has decided to ***.    Lucretia Roers 02/08/2023, 9:31 AM

## 2023-02-08 NOTE — Patient Instructions (Signed)
Will need to discuss this finding with Dr. Ellin Saba. Will call you with the plan with regards to the mass on the leg once he has responded to the information I sent him.

## 2023-02-08 NOTE — Telephone Encounter (Signed)
   Pre-operative Risk Assessment  Last visit: 07/13/2022 Next visit: none   Patient Name: Leslie Williamson  DOB: [birth year] MRN: [id number]      Request for Surgical Clearance    Procedure:   excisional biopsy right lower extremity mass  Date of Surgery:  Clearance TBD                                 Surgeon:  Dr. Algis Greenhouse Surgeon's Group or Practice Name:  [facility] Phone number:  [phone] Fax number:  [phone]   Type of Clearance Requested:   - Pharmacy:  Hold Apixaban (Eliquis) and [facility] Eliquis 2 days and Farxiga 3 days   Type of Anesthesia:  Local / Choice   Additional requests/questions:    Sharen Hones   02/08/2023, 3:30 PM

## 2023-02-10 NOTE — Telephone Encounter (Signed)
May the patient hold her Marcelline Deist she is taking for heart failure?

## 2023-02-10 NOTE — Telephone Encounter (Signed)
Patient on Eliquis for PE. We have only seen patient in office once. Clearance should be given by managing provider which I believe to be [name] D [name].

## 2023-02-10 NOTE — Telephone Encounter (Addendum)
   Patient Name: Leslie Williamson  DOB: [birth year] MRN: [id number]  Primary Cardiologist: Dina Rich, MD  Clinical pharmacists have reviewed the patient's past medical history, labs, and current medications as part of preoperative protocol coverage. The following recommendations have been made:  Patient on Eliquis for PE. We have only seen patient in office once. Clearance should be given by managing provider which I believe to be [name] D [name].  Patient may hold Farxiga 3 days prior to procedure and should restart postprocedure.    I will route this recommendation to the requesting party via Epic fax function and remove from pre-op pool.  Please call with questions.  Napoleon Form, Leodis Rains, NP 02/10/2023, 1:21 PM

## 2023-02-11 NOTE — Addendum Note (Signed)
Addended by: Phillips Odor on: 02/11/2023 09:24 AM   Modules accepted: Orders

## 2023-02-11 NOTE — H&P (Signed)
[facility] History and Physical   Reason for Referral: Right lower extremity mass Referring Physician: Oncology   Chief Complaint   New Patient (Initial Visit)        Leslie Williamson is a 89 y.o. female.  HPI: Leslie Williamson is an 89 yo who comes in with a finding of a right lower extremity mass first noted in the ED after a visit. This was worked up with imaging and then IR biopsy. She has a history of lymphoma s/p splenectomy and multiple myeloma. The pathology from the IR biopsy came back with Malignant hematopoietic neoplasm. On the referral today the information I have is for excision. She is here with her son.        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   Vertigo, labyrinthine                 Past Surgical History:  Procedure Laterality Date   CATARACT EXTRACTION W/PHACO Left 11/03/2015    Procedure: CATARACT EXTRACTION PHACO AND INTRAOCULAR LENS PLACEMENT (IOC);  Surgeon: Gemma Payor, MD;  Location: [facility];  Service: Ophthalmology;  Laterality: Left;  CDE: 7.74   CATARACT EXTRACTION W/PHACO Right 11/20/2015    Procedure: CATARACT EXTRACTION PHACO AND INTRAOCULAR LENS PLACEMENT ; CDE:  8.30;  Surgeon: Gemma Payor, MD;  Location: [facility];  Service: Ophthalmology;  Laterality: Right;   IR IMAGING GUIDED PORT INSERTION   11/04/2021   PORT-A-CATH REMOVAL                    Family History  Problem Relation Age of Onset   Diabetes Mother            Social History  Social History        Tobacco Use   Smoking status: Former   Smokeless tobacco: Never  Vaping Use   Vaping status: Never Used  Substance Use Topics   Alcohol use: No   Drug use: No        Medications: I have reviewed the patient's current medications. Allergies as of 02/08/2023         Reactions    Penicillins Rash            Medication List           Accurate as of February 08, 2023  9:31 AM. If you have any questions, ask your nurse or doctor.              acetaminophen 500 MG tablet Commonly known as: TYLENOL Take 1,000 mg by mouth every 6 (six) hours as needed for moderate pain.    acyclovir 400 MG tablet Commonly known as: ZOVIRAX Take 1 tablet (400 mg  total) by mouth 2 (two) times daily.    Aspirin Low Dose 81 MG chewable tablet Generic drug: aspirin Chew 81 mg by mouth daily.    atorvastatin 40 MG tablet Commonly known as: LIPITOR Take 40 mg by mouth at bedtime.    dapagliflozin propanediol 10 MG Tabs tablet Commonly known as: FARXIGA Take 1 tablet (10 mg total) by mouth daily.    dexamethasone 4 MG tablet Commonly known as: DECADRON Take 10 mg (2.5 tablets) by mouth weekly every Wednesday morning Per note by Dr. Ellin Saba on 09/15/2022    Eliquis DVT/PE Starter Pack Generic drug: Apixaban Starter Pack (10mg  and 5mg ) Take as directed on package: start with two-5mg  tablets twice daily for 7 days. On day 8, switch to one-5mg  tablet twice daily.    Eliquis 5 MG Tabs tablet Generic drug: apixaban Take 1 tablet (5 mg total) by mouth 2 (two) times daily.    furosemide 40 MG tablet Commonly known as: LASIX Take 40mg  as needed if you gain more than 3 pounds in 1 day or 5 pounds in 1 week    lenalidomide 10 MG capsule Commonly known as: REVLIMID Take 1 capsule (10 mg total) by mouth daily. Take for 21 days on, 7 days off.     lidocaine-prilocaine cream Commonly known as: EMLA Apply 1 Application topically as needed.    losartan 25 MG tablet Commonly known as: COZAAR Take 12.5 mg by mouth daily.    metoprolol succinate 25 MG 24 hr tablet Commonly known as: TOPROL-XL Take 0.5 tablets (12.5 mg total) by mouth daily.    potassium chloride SA 20 MEQ tablet Commonly known as: Klor-Con M20 Take 1 tablet on the days you take lasix    spironolactone 25 MG tablet Commonly known as: ALDACTONE Take 25 mg by mouth 2 (two) times daily.               ROS:  A comprehensive review of systems was negative except for: Respiratory: positive for runny nose Hematologic/lymphatic: positive for PE on eliquis    Blood pressure 128/62, pulse 68, temperature (!) 97.1 F (36.2 C), temperature source Oral, resp. rate 12, height 5\' 5"  (1.651 m), weight 118 lb (53.5 kg), SpO2 94%. Physical Exam Vitals reviewed.  HENT:     Head: Normocephalic.     Nose: Nose normal.  Eyes:     Extraocular Movements: Extraocular movements intact.  Cardiovascular:     Rate and Rhythm: Normal rate.  Pulmonary:     Effort: Pulmonary effort is normal.     Breath sounds: Normal breath sounds.  Abdominal:     General: There is no distension.     Palpations: Abdomen is soft.  Musculoskeletal:        General: No swelling.     Comments: Right lower medial leg with subcutaneous mass that is about 4-5cm in size, spongy, tender  Skin:    General: Skin is warm.  Neurological:     General: No focal deficit present.     Mental Status: She is alert and oriented to person, place, and time.  Psychiatric:        Mood and Affect: Mood normal.        Behavior: Behavior normal.        Results: Personally reviewed and noted mass and location/ size  CLINICAL DATA:  Soft tissue infection suspected. Mass versus a traumatic hematoma anteriorly in the lower leg on ultrasound.   EXAM: MRI OF LOWER RIGHT EXTREMITY WITHOUT AND  WITH CONTRAST    TECHNIQUE: Multiplanar, multisequence MR imaging of the right lower leg was performed both before and after administration of intravenous contrast.   CONTRAST:  6mL GADAVIST GADOBUTROL 1 MMOL/ML IV SOLN   COMPARISON:  Radiographs and Doppler ultrasound same date. No other relevant comparison imaging.   FINDINGS: Bones/Joint/Cartilage   The left lower leg is included on the coronal images. There is no evidence of acute fracture, dislocation or aggressive osseous lesion in either lower leg. The right tibia and fibula appear unremarkable. Mild degenerative changes noted at both knees without significant joint effusions.   Ligaments   Not relevant for exam/indication. No significant ligamentous abnormalities are seen.   Muscles and Tendons   No focal muscular atrophy, edema or suspicious enhancement. There is low-level enhancement within the peroneal and soleus musculature which is nonspecific. The patellar tendon appears normal. The visualized ankle tendons are intact without tenosynovitis, although their distal insertions are incompletely visualized.   Soft tissues   As seen on earlier ultrasound, there is a subcutaneous mass anteromedially in the right lower leg, measuring approximately 4.5 x 3.2 x 1.7 cm. This demonstrates homogeneous T1 signal isointense to muscle, T2 signal mildly hyperintense to muscle, and intense uniform enhancement following contrast. No other enhancing soft tissue masses are identified. There is nonspecific subcutaneous edema within both lower legs which appears fairly symmetric. No focal fluid collections, enlarged lymph nodes or vascular abnormalities are identified.   IMPRESSION: 1. Enhancing subcutaneous mass anteromedially in the right lower leg as described, most consistent with a soft tissue neoplasm (possibly sarcoma or lymphoma). Tissue sampling recommended. 2. No other soft tissue masses or enlarged lymph nodes are identified. No  evidence of right lower leg abscess. 3. No acute osseous findings or evidence of osteomyelitis. 4. Nonspecific subcutaneous edema within both lower legs.     Electronically Signed   By: Carey Bullocks M.D.   On: 01/03/2023 18:54     Assessment & Plan:  KASSIA STAVES is a 89 y.o. female with a right lower extremity mass. She has a history of myeloma and lymphoma. Given the size and location, if Oncology is wanting an Oncologic excision she would need a Surgical Oncologist and Plastics surgery. I need to discuss this with Dr. Ellin Saba further.   Will need to discuss this finding with Dr. Ellin Saba. Will call you with the plan with regards to the mass on the leg once he has responded to the information I sent him.   I talked with Dr. Ellin Saba after the patient and son left:  The pathologist discussed the findings with Dr. Ellin Saba and is concerned that this could be more of a lymphoma and recommended an excisional biopsy. I have discussed with Dr. Ellin Saba and have confirmed that this is what he wants, an excisional biopsy and not a full excision with margins of this tissue.    I called the son and told him the plan for an excisional biopsy and that we would need to verify that she could hold her Eliquis, Marcelline Deist etc.   This would be removing a portion of the mass and sending the tissue for multiple testing. Discussed risk of bleeding, infection, needing more surgery to remove it with margins in the future pending the findings and Dr. Marice Potter plan.   I told Leslie Williamson her son that the office will be in touch to get her scheduled for the excisional biopsy of the right leg mass likely the first part of the year. He is  aware and in agreement.        Leslie Williamson 02/08/2023, 9:31 AM

## 2023-02-14 NOTE — Telephone Encounter (Signed)
Discussed with Dr. Lorelle Formosa and Dr. Henreitta Leber as patient is also on Decadron and Revlimid.   Dr. Lorelle Formosa advised that Decadron and Revlimid do not need to be help, but Eliquis may be held x2 days.

## 2023-02-14 NOTE — Patient Instructions (Signed)
Leslie Williamson  02/14/2023     @PREFPERIOPPHARMACY @   Your procedure is scheduled on  02/18/2023.         Your last dose of farxiga should be 12/23 and your last dose of eliquis soul be on 12/24.    Report to [facility] at  0830 A.M.   Call this number if you have problems the morning of surgery:  [phone]  If you experience any cold or flu symptoms such as cough, fever, chills, shortness of breath, etc. between now and your scheduled surgery, please notify us at the above number.   Remember:  Do not eat after midnight.    You may drink clear liquids until 0630 am on 02/18/2023.    Clear liquids allowed are:                    Water, Juice (No red color; non-citric and without pulp; diabetics please choose diet or no sugar options), Carbonated beverages (diabetics please choose diet or no sugar options), Clear Tea (No creamer, milk, or cream, including half & half and powdered creamer), Black Coffee Only (No creamer, milk or cream, including half & half and powdered creamer), and Clear Sports drink (No red color; diabetics please choose diet or no sugar options)    Take these medicines the morning of surgery with A SIP OF WATER                                            metoprolol.    Do not wear jewelry, make-up or nail polish, including gel polish,  artificial nails, or any other type of covering on natural nails (fingers and  toes).  Do not wear lotions, powders, or perfumes, or deodorant.  Do not shave 48 hours prior to surgery.  Men may shave face and neck.  Do not bring valuables to the hospital.  [facility] is not responsible for any belongings or valuables.  Contacts, dentures or bridgework may not be worn into surgery.  Leave your suitcase in the car.  After surgery it may be brought to your room.  For patients admitted to the hospital, discharge time will be determined by your treatment team.  Patients discharged the day of surgery will not be  allowed to drive home and must have someone with them for 24 hours.    Special instructions:   DO NOT smoke tobacco or vape for 24 hours before your procedure.  Please read over the following fact sheets that you were given. Anesthesia Post-op Instructions and Care and Recovery After Surgery        Incision Care, Adult An incision is a cut that a doctor makes in your skin for surgery. Most times, these cuts are closed after surgery. Your cut from surgery may be closed with: Stitches (sutures). Staples. Skin glue. Skin tape (adhesive) strips. You may need to go back to your doctor to have stitches or staples taken out. This may happen many days or many weeks after your surgery. You need to take good care of your cut so it does not get infected. Follow instructions from your doctor about how to care for your cut. Supplies needed: Soap and water. A clean hand towel. Wound cleanser. A clean bandage (dressing), if needed. Cream or ointment, if told by your doctor. Clean gauze. How to  care for your cut from surgery Cleaning your cut Ask your doctor how to clean your cut. You may need to: Wear medical gloves. Use mild soap and water, or a wound cleanser. Use a clean gauze to pat your cut dry after you clean it. Changing your bandage Wash your hands with soap and water for at least 20 seconds before and after you change your bandage. If you cannot use soap and water, use hand sanitizer. Do not usedisinfectants or antiseptics, such as rubbing alcohol, to clean your wound unless told by your doctor. Change your bandage as told by your doctor. Leavestitches or skin glue in place for at least 2 weeks. Leave tape strips alone unless you are told to take them off. You may trim the edges of the tape strips if they curl up. Put a cream or ointment on your cut. Do this only as told. Cover your cut with a clean bandage. Ask your doctor when you can leave your cut uncovered. Checking for  infection Check your cut area every day for signs of infection. Check for: More redness, swelling, or pain. More fluid or blood. New warmth. Hardness or a new rash around the incision. Pus or a bad smell.  Follow these instructions at home Medicines Take over-the-counter and prescription medicines only as told by your doctor. If you were prescribed an antibiotic medicine, cream, or ointment, use it as told by your doctor. Do not stop using the antibiotic even if you start to feel better. Eating and drinking Eat foods that have a lot of certain nutrients, such as protein, vitamin A, and vitamin C. These foods help your cut heal. Foods rich in protein include meat, fish, eggs, dairy, beans, nuts, and protein drinks. Foods rich in vitamin A include carrots and dark green, leafy vegetables. Foods rich in vitamin C include citrus fruits, tomatoes, broccoli, and peppers. Drink enough fluid to keep your pee (urine) pale yellow. General instructions  Do not take baths, swim, or use a hot tub. Ask your doctor about taking showers or sponge baths. Limit movement around your cut. This helps with healing. Try not to strain, lift, or exercise for the first 2 weeks, or for as long as told by your doctor. Return to your normal activities as told by your doctor. Ask your doctor what activities are safe for you. Do not scratch, scrub, or pick at your cut. Keep it covered as told by your doctor. Protect your cut from the sun when you are outside for the first 6 months, or for as long as told by your doctor. Cover up the scar area or put on sunscreen that has an SPF of at least 30. Do not use any products that contain nicotine or tobacco, such as cigarettes, e-cigarettes, and chewing tobacco. These can delay cut healing. If you need help quitting, ask your doctor. Keep all follow-up visits. Contact a doctor if: You have any of these signs of infection around your cut: More redness, swelling, or  pain. More fluid or blood. New warmth or hardness. Pus or a bad smell. A new rash. You have a fever. You feel like you may vomit (nauseous). You vomit. You are dizzy. Your stitches, staples, skin glue, or tape strips come undone. Your cut gets bigger. You have a fever. Get help right away if: Your cut bleeds through your bandage, and bleeding does not stop with gentle pressure. Your cut opens up and comes apart. These symptoms may be an emergency. Do not  wait to see if the symptoms will go away. Get medical help right away. Call your local emergency services (911 in the [place] Do not drive yourself to the hospital. Summary Follow instructions from your doctor about how to care for your cut. Wash your hands with soap and water for at least 20 seconds before and after you change your bandage. If you cannot use soap and water, use hand sanitizer. Check your cut area every day for signs of infection. Keep all follow-up visits. This information is not intended to replace advice given to you by your health care provider. Make sure you discuss any questions you have with your health care provider. Document Revised: 05/12/2020 Document Reviewed: 05/12/2020 Elsevier Patient Education  2024 [employer] General Anesthesia, Adult, Care After The following information offers guidance on how to care for yourself after your procedure. Your health care provider may also give you more specific instructions. If you have problems or questions, contact your health care provider. What can I expect after the procedure? After the procedure, it is common for people to: Have pain or discomfort at the IV site. Have nausea or vomiting. Have a sore throat or hoarseness. Have trouble concentrating. Feel cold or chills. Feel weak, sleepy, or tired (fatigue). Have soreness and body aches. These can affect parts of the body that were not involved in surgery. Follow these instructions at home: For the time  period you were told by your health care provider:  Rest. Do not participate in activities where you could fall or become injured. Do not drive or use machinery. Do not drink alcohol. Do not take sleeping pills or medicines that cause drowsiness. Do not make important decisions or sign legal documents. Do not take care of children on your own. General instructions Drink enough fluid to keep your urine pale yellow. If you have sleep apnea, surgery and certain medicines can increase your risk for breathing problems. Follow instructions from your health care provider about wearing your sleep device: Anytime you are sleeping, including during daytime naps. While taking prescription pain medicines, sleeping medicines, or medicines that make you drowsy. Return to your normal activities as told by your health care provider. Ask your health care provider what activities are safe for you. Take over-the-counter and prescription medicines only as told by your health care provider. Do not use any products that contain nicotine or tobacco. These products include cigarettes, chewing tobacco, and vaping devices, such as e-cigarettes. These can delay incision healing after surgery. If you need help quitting, ask your health care provider. Contact a health care provider if: You have nausea or vomiting that does not get better with medicine. You vomit every time you eat or drink. You have pain that does not get better with medicine. You cannot urinate or have bloody urine. You develop a skin rash. You have a fever. Get help right away if: You have trouble breathing. You have chest pain. You vomit blood. These symptoms may be an emergency. Get help right away. Call 911. Do not wait to see if the symptoms will go away. Do not drive yourself to the hospital. Summary After the procedure, it is common to have a sore throat, hoarseness, nausea, vomiting, or to feel weak, sleepy, or fatigue. For the time  period you were told by your health care provider, do not drive or use machinery. Get help right away if you have difficulty breathing, have chest pain, or vomit blood. These symptoms may be an emergency. This  information is not intended to replace advice given to you by your health care provider. Make sure you discuss any questions you have with your health care provider. Document Revised: 05/08/2021 Document Reviewed: 05/08/2021 Elsevier Patient Education  2024 [employer]. How to Use Chlorhexidine Before Surgery Chlorhexidine gluconate (CHG) is a germ-killing (antiseptic) solution that is used to clean the skin. It can get rid of the bacteria that normally live on the skin and can keep them away for about 24 hours. To clean your skin with CHG, you may be given: A CHG solution to use in the shower or as part of a sponge bath. A prepackaged cloth that contains CHG. Cleaning your skin with CHG may help lower the risk for infection: While you are staying in the intensive care unit of the hospital. If you have a vascular access, such as a central line, to provide short-term or long-term access to your veins. If you have a catheter to drain urine from your bladder. If you are on a ventilator. A ventilator is a machine that helps you breathe by moving air in and out of your lungs. After surgery. What are the risks? Risks of using CHG include: A skin reaction. Hearing loss, if CHG gets in your ears and you have a perforated eardrum. Eye injury, if CHG gets in your eyes and is not rinsed out. The CHG product catching fire. Make sure that you avoid smoking and flames after applying CHG to your skin. Do not use CHG: If you have a chlorhexidine allergy or have previously reacted to chlorhexidine. On babies younger than 91 months of age. How to use CHG solution Use CHG only as told by your health care provider, and follow the instructions on the label. Use the full amount of CHG as directed.  Usually, this is one bottle. During a shower Follow these steps when using CHG solution during a shower (unless your health care provider gives you different instructions): Start the shower. Use your normal soap and shampoo to wash your face and hair. Turn off the shower or move out of the shower stream. Pour the CHG onto a clean washcloth. Do not use any type of brush or rough-edged sponge. Starting at your neck, lather your body down to your toes. Make sure you follow these instructions: If you will be having surgery, pay special attention to the part of your body where you will be having surgery. Scrub this area for at least 1 minute. Do not use CHG on your head or face. If the solution gets into your ears or eyes, rinse them well with water. Avoid your genital area. Avoid any areas of skin that have broken skin, cuts, or scrapes. Scrub your back and under your arms. Make sure to wash skin folds. Let the lather sit on your skin for 1-2 minutes or as long as told by your health care provider. Thoroughly rinse your entire body in the shower. Make sure that all body creases and crevices are rinsed well. Dry off with a clean towel. Do not put any substances on your body afterward--such as powder, lotion, or perfume--unless you are told to do so by your health care provider. Only use lotions that are recommended by the manufacturer. Put on clean clothes or pajamas. If it is the night before your surgery, sleep in clean sheets.  During a sponge bath Follow these steps when using CHG solution during a sponge bath (unless your health care provider gives you different instructions): Use  your normal soap and shampoo to wash your face and hair. Pour the CHG onto a clean washcloth. Starting at your neck, lather your body down to your toes. Make sure you follow these instructions: If you will be having surgery, pay special attention to the part of your body where you will be having surgery. Scrub this  area for at least 1 minute. Do not use CHG on your head or face. If the solution gets into your ears or eyes, rinse them well with water. Avoid your genital area. Avoid any areas of skin that have broken skin, cuts, or scrapes. Scrub your back and under your arms. Make sure to wash skin folds. Let the lather sit on your skin for 1-2 minutes or as long as told by your health care provider. Using a different clean, wet washcloth, thoroughly rinse your entire body. Make sure that all body creases and crevices are rinsed well. Dry off with a clean towel. Do not put any substances on your body afterward--such as powder, lotion, or perfume--unless you are told to do so by your health care provider. Only use lotions that are recommended by the manufacturer. Put on clean clothes or pajamas. If it is the night before your surgery, sleep in clean sheets. How to use CHG prepackaged cloths Only use CHG cloths as told by your health care provider, and follow the instructions on the label. Use the CHG cloth on clean, dry skin. Do not use the CHG cloth on your head or face unless your health care provider tells you to. When washing with the CHG cloth: Avoid your genital area. Avoid any areas of skin that have broken skin, cuts, or scrapes. Before surgery Follow these steps when using a CHG cloth to clean before surgery (unless your health care provider gives you different instructions): Using the CHG cloth, vigorously scrub the part of your body where you will be having surgery. Scrub using a back-and-forth motion for 3 minutes. The area on your body should be completely wet with CHG when you are done scrubbing. Do not rinse. Discard the cloth and let the area air-dry. Do not put any substances on the area afterward, such as powder, lotion, or perfume. Put on clean clothes or pajamas. If it is the night before your surgery, sleep in clean sheets.  For general bathing Follow these steps when using CHG  cloths for general bathing (unless your health care provider gives you different instructions). Use a separate CHG cloth for each area of your body. Make sure you wash between any folds of skin and between your fingers and toes. Wash your body in the following order, switching to a new cloth after each step: The front of your neck, shoulders, and chest. Both of your arms, under your arms, and your hands. Your stomach and groin area, avoiding the genitals. Your right leg and foot. Your left leg and foot. The back of your neck, your back, and your buttocks. Do not rinse. Discard the cloth and let the area air-dry. Do not put any substances on your body afterward--such as powder, lotion, or perfume--unless you are told to do so by your health care provider. Only use lotions that are recommended by the manufacturer. Put on clean clothes or pajamas. Contact a health care provider if: Your skin gets irritated after scrubbing. You have questions about using your solution or cloth. You swallow any chlorhexidine. Call your local poison control center (586-649-6005 in the [place] Get help right away if:  Your eyes itch badly, or they become very red or swollen. Your skin itches badly and is red or swollen. Your hearing changes. You have trouble seeing. You have swelling or tingling in your mouth or throat. You have trouble breathing. These symptoms may represent a serious problem that is an emergency. Do not wait to see if the symptoms will go away. Get medical help right away. Call your local emergency services (911 in the [place] Do not drive yourself to the hospital. Summary Chlorhexidine gluconate (CHG) is a germ-killing (antiseptic) solution that is used to clean the skin. Cleaning your skin with CHG may help to lower your risk for infection. You may be given CHG to use for bathing. It may be in a bottle or in a prepackaged cloth to use on your skin. Carefully follow your health care provider's  instructions and the instructions on the product label. Do not use CHG if you have a chlorhexidine allergy. Contact your health care provider if your skin gets irritated after scrubbing. This information is not intended to replace advice given to you by your health care provider. Make sure you discuss any questions you have with your health care provider. Document Revised: 06/08/2021 Document Reviewed: 04/21/2020 Elsevier Patient Education  2023 [employer].

## 2023-02-16 ENCOUNTER — Other Ambulatory Visit: Payer: Self-pay

## 2023-02-17 ENCOUNTER — Encounter (HOSPITAL_COMMUNITY): Payer: Self-pay

## 2023-02-17 ENCOUNTER — Encounter (HOSPITAL_COMMUNITY)
Admission: RE | Admit: 2023-02-17 | Discharge: 2023-02-17 | Disposition: A | Discharge status: Home / Self Care | Payer: Medicare Other | Source: Ambulatory Visit | Attending: General Surgery | Admitting: General Surgery

## 2023-02-17 VITALS — BP 118/56 | HR 67 | Temp 98.3°F | Resp 18

## 2023-02-17 DIAGNOSIS — E876 Hypokalemia: Secondary | ICD-10-CM

## 2023-02-17 DIAGNOSIS — D473 Essential (hemorrhagic) thrombocythemia: Secondary | ICD-10-CM

## 2023-02-18 ENCOUNTER — Encounter (HOSPITAL_COMMUNITY)
Admission: RE | Disposition: A | Discharge status: Home / Self Care | Payer: Self-pay | Source: Home / Self Care | Attending: General Surgery

## 2023-02-18 ENCOUNTER — Ambulatory Visit (HOSPITAL_COMMUNITY)
Admission: RE | Admit: 2023-02-18 | Discharge: 2023-02-18 | Disposition: A | Discharge status: Home / Self Care | Payer: Medicare Other | Attending: General Surgery | Admitting: General Surgery

## 2023-02-18 ENCOUNTER — Ambulatory Visit (HOSPITAL_COMMUNITY): Payer: Medicare Other | Admitting: Certified Registered"

## 2023-02-18 DIAGNOSIS — N189 Chronic kidney disease, unspecified: Secondary | ICD-10-CM

## 2023-02-18 DIAGNOSIS — Z87891 Personal history of nicotine dependence: Secondary | ICD-10-CM | POA: Diagnosis not present

## 2023-02-18 DIAGNOSIS — I509 Heart failure, unspecified: Secondary | ICD-10-CM

## 2023-02-18 DIAGNOSIS — I739 Peripheral vascular disease, unspecified: Secondary | ICD-10-CM | POA: Diagnosis not present

## 2023-02-18 DIAGNOSIS — R2241 Localized swelling, mass and lump, right lower limb: Secondary | ICD-10-CM | POA: Diagnosis present

## 2023-02-18 DIAGNOSIS — I110 Hypertensive heart disease with heart failure: Secondary | ICD-10-CM | POA: Insufficient documentation

## 2023-02-18 DIAGNOSIS — Z7984 Long term (current) use of oral hypoglycemic drugs: Secondary | ICD-10-CM | POA: Diagnosis not present

## 2023-02-18 DIAGNOSIS — Z9081 Acquired absence of spleen: Secondary | ICD-10-CM | POA: Insufficient documentation

## 2023-02-18 DIAGNOSIS — I130 Hypertensive heart and chronic kidney disease with heart failure and stage 1 through stage 4 chronic kidney disease, or unspecified chronic kidney disease: Secondary | ICD-10-CM | POA: Diagnosis not present

## 2023-02-18 DIAGNOSIS — C8330 Diffuse large B-cell lymphoma, unspecified site: Secondary | ICD-10-CM | POA: Insufficient documentation

## 2023-02-18 DIAGNOSIS — I5030 Unspecified diastolic (congestive) heart failure: Secondary | ICD-10-CM | POA: Diagnosis not present

## 2023-02-18 DIAGNOSIS — C8335 Diffuse large B-cell lymphoma, lymph nodes of inguinal region and lower limb: Secondary | ICD-10-CM | POA: Diagnosis not present

## 2023-02-18 HISTORY — PX: MASS EXCISION: SHX2000

## 2023-02-18 LAB — POCT I-STAT, CHEM 8
BUN: 20 mg/dL (ref 8–23)
Calcium, Ion: 0.89 mmol/L — CL (ref 1.15–1.40)
Chloride: 111 mmol/L (ref 98–111)
Creatinine, Ser: 1.5 mg/dL — ABNORMAL HIGH (ref 0.44–1.00)
Glucose, Bld: 77 mg/dL (ref 70–99)
HCT: 31 % — ABNORMAL LOW (ref 36.0–46.0)
Hemoglobin: 10.5 g/dL — ABNORMAL LOW (ref 12.0–15.0)
Potassium: 3.5 mmol/L (ref 3.5–5.1)
Sodium: 138 mmol/L (ref 135–145)
TCO2: 18 mmol/L — ABNORMAL LOW (ref 22–32)

## 2023-02-18 SURGERY — EXCISION MASS
Anesthesia: General | Site: Leg Lower | Laterality: Right

## 2023-02-18 MED ORDER — CEFAZOLIN SODIUM-DEXTROSE 2-4 GM/100ML-% IV SOLN
2.0000 g | INTRAVENOUS | Status: AC
  Administered 2023-02-18: 2 g via INTRAVENOUS
  Filled 2023-02-18: qty 100

## 2023-02-18 MED ORDER — FENTANYL CITRATE (PF) 100 MCG/2ML IJ SOLN
INTRAMUSCULAR | Status: AC
  Filled 2023-02-18: qty 2

## 2023-02-18 MED ORDER — LIDOCAINE HCL (PF) 1 % IJ SOLN
INTRAMUSCULAR | Status: DC | PRN
  Administered 2023-02-18: 8 mL

## 2023-02-18 MED ORDER — CHLORHEXIDINE GLUCONATE CLOTH 2 % EX PADS: 6.0000 | MEDICATED_PAD | Freq: Once | CUTANEOUS | Status: DC

## 2023-02-18 MED ORDER — DEXMEDETOMIDINE HCL IN NACL 80 MCG/20ML IV SOLN
INTRAVENOUS | Status: AC
  Filled 2023-02-18: qty 20

## 2023-02-18 MED ORDER — HEPARIN SOD (PORK) LOCK FLUSH 100 UNIT/ML IV SOLN
500.0000 [IU] | Freq: Once | INTRAVENOUS | Status: AC
  Administered 2023-02-18: 500 [IU] via INTRAVENOUS
  Filled 2023-02-18: qty 5

## 2023-02-18 MED ORDER — FENTANYL CITRATE (PF) 100 MCG/2ML IJ SOLN
INTRAMUSCULAR | Status: DC | PRN
  Administered 2023-02-18: 25 ug via INTRAVENOUS

## 2023-02-18 MED ORDER — ONDANSETRON HCL 4 MG/2ML IJ SOLN
INTRAMUSCULAR | Status: DC | PRN
  Administered 2023-02-18: 4 mg via INTRAVENOUS

## 2023-02-18 MED ORDER — OXYCODONE HCL 5 MG PO TABS: 5.0000 mg | ORAL_TABLET | ORAL | 0 refills | Status: DC | PRN

## 2023-02-18 MED ORDER — ONDANSETRON HCL 4 MG/2ML IJ SOLN: 4.0000 mg | Freq: Once | INTRAMUSCULAR | Status: DC | PRN

## 2023-02-18 MED ORDER — PROPOFOL 500 MG/50ML IV EMUL
INTRAVENOUS | Status: DC | PRN
  Administered 2023-02-18: 75 ug/kg/min via INTRAVENOUS

## 2023-02-18 MED ORDER — EPHEDRINE 5 MG/ML INJ
INTRAVENOUS | Status: AC
  Filled 2023-02-18: qty 5

## 2023-02-18 MED ORDER — ORAL CARE MOUTH RINSE: 15.0000 mL | Freq: Once | OROMUCOSAL | Status: DC

## 2023-02-18 MED ORDER — ONDANSETRON HCL 4 MG/2ML IJ SOLN
INTRAMUSCULAR | Status: AC
  Filled 2023-02-18: qty 2

## 2023-02-18 MED ORDER — CHLORHEXIDINE GLUCONATE 0.12 % MT SOLN
15.0000 mL | Freq: Once | OROMUCOSAL | Status: DC
Start: 2023-02-18 — End: 2023-02-18

## 2023-02-18 MED ORDER — PROPOFOL 10 MG/ML IV BOLUS
INTRAVENOUS | Status: DC | PRN
  Administered 2023-02-18: 40 mg via INTRAVENOUS

## 2023-02-18 MED ORDER — EPHEDRINE SULFATE-NACL 50-0.9 MG/10ML-% IV SOSY
PREFILLED_SYRINGE | INTRAVENOUS | Status: DC | PRN
Start: 2023-02-18 — End: 2023-02-18
  Administered 2023-02-18: 5 mg via INTRAVENOUS
  Administered 2023-02-18 (×2): 10 mg via INTRAVENOUS

## 2023-02-18 MED ORDER — PROPOFOL 500 MG/50ML IV EMUL
INTRAVENOUS | Status: AC
  Filled 2023-02-18: qty 50

## 2023-02-18 MED ORDER — PROPOFOL 10 MG/ML IV BOLUS
INTRAVENOUS | Status: AC
  Filled 2023-02-18: qty 20

## 2023-02-18 MED ORDER — HYDROCODONE-ACETAMINOPHEN 7.5-325 MG PO TABS: 1.0000 | ORAL_TABLET | Freq: Once | ORAL | Status: DC | PRN

## 2023-02-18 MED ORDER — HEPARIN SOD (PORK) LOCK FLUSH 100 UNIT/ML IV SOLN
INTRAVENOUS | Status: AC
  Filled 2023-02-18: qty 5

## 2023-02-18 MED ORDER — LIDOCAINE HCL (PF) 2 % IJ SOLN
INTRAMUSCULAR | Status: AC
  Filled 2023-02-18: qty 5

## 2023-02-18 MED ORDER — ONDANSETRON HCL 4 MG PO TABS: 4.0000 mg | ORAL_TABLET | Freq: Three times a day (TID) | ORAL | 1 refills | Status: DC | PRN

## 2023-02-18 MED ORDER — 0.9 % SODIUM CHLORIDE (POUR BTL) OPTIME
TOPICAL | Status: DC | PRN
  Administered 2023-02-18: 1000 mL

## 2023-02-18 MED ORDER — FENTANYL CITRATE PF 50 MCG/ML IJ SOSY: 25.0000 ug | PREFILLED_SYRINGE | INTRAMUSCULAR | Status: DC | PRN

## 2023-02-18 MED ORDER — DEXMEDETOMIDINE HCL IN NACL 80 MCG/20ML IV SOLN
INTRAVENOUS | Status: DC | PRN
  Administered 2023-02-18: 4 ug via INTRAVENOUS

## 2023-02-18 MED ORDER — LACTATED RINGERS IV SOLN: INTRAVENOUS | Status: DC | PRN

## 2023-02-18 SURGICAL SUPPLY — 22 items
APPLICATOR CHLORAPREP 10.5 ORG (MISCELLANEOUS) ×2 IMPLANT
CLOTH BEACON ORANGE TIMEOUT ST (SAFETY) ×1 IMPLANT
COVER LIGHT HANDLE STERIS (MISCELLANEOUS) ×2 IMPLANT
DECANTER SPIKE VIAL GLASS SM (MISCELLANEOUS) ×2 IMPLANT
ELECT REM PT RETURN 9FT ADLT (ELECTROSURGICAL) ×1
ELECTRODE REM PT RTRN 9FT ADLT (ELECTROSURGICAL) ×1 IMPLANT
GLOVE BIO SURGEON STRL SZ 6.5 (GLOVE) ×1 IMPLANT
GLOVE BIOGEL PI IND STRL 6.5 (GLOVE) ×2 IMPLANT
GLOVE BIOGEL PI IND STRL 7.0 (GLOVE) ×4 IMPLANT
GOWN STRL REUS W/TWL LRG LVL3 (GOWN DISPOSABLE) ×2 IMPLANT
KIT TURNOVER KIT A (KITS) ×1 IMPLANT
MANIFOLD NEPTUNE II (INSTRUMENTS) ×1 IMPLANT
NDL HYPO 25X1 1.5 SAFETY (NEEDLE) ×1 IMPLANT
NEEDLE HYPO 25X1 1.5 SAFETY (NEEDLE) ×1
NS IRRIG 1000ML POUR BTL (IV SOLUTION) ×1 IMPLANT
PACK MINOR (CUSTOM PROCEDURE TRAY) IMPLANT
PAD ARMBOARD 7.5X6 YLW CONV (MISCELLANEOUS) ×2 IMPLANT
POSITIONER HEAD 8X9X4 ADT (SOFTGOODS) ×1 IMPLANT
SET BASIN LINEN APH (SET/KITS/TRAYS/PACK) ×1 IMPLANT
SUT MNCRL AB 4-0 PS2 18 (SUTURE) ×2 IMPLANT
SUT VIC AB 3-0 SH 27X BRD (SUTURE) IMPLANT
SYR CONTROL 10ML LL (SYRINGE) ×1 IMPLANT

## 2023-02-18 NOTE — Transfer of Care (Addendum)
Immediate Anesthesia Transfer of Care Note  Patient: Leslie Williamson  Procedure(s) Performed: Incisional Biopsy Right Lower Extremity (Right: Leg Lower)  Patient Location: PACU  Anesthesia Type:General  Level of Consciousness: awake and patient cooperative  Airway & Oxygen Therapy: Patient Spontanous Breathing and Patient connected to face mask oxygen  Post-op Assessment: Report given to RN and Post -op Vital signs reviewed and stable  Post vital signs: Reviewed and stable  Last Vitals:  Vitals Value Taken Time  BP 127/60 02/18/23   0951  Temp 36.4 02/18/23   0951  Pulse 64 02/18/23 0953  Resp 13 02/18/23 0953  SpO2 100 % 02/18/23 0953  Vitals shown include unfiled device data.  Last Pain:  Vitals:   02/18/23 0833  TempSrc: Oral  PainSc: 0-No pain      Patients Stated Pain Goal: 6 (02/18/23 4098)  Complications: No notable events documented.

## 2023-02-18 NOTE — Interval H&P Note (Signed)
History and Physical Interval Note:  02/18/2023 8:27 AM  Leslie Williamson  has presented today for surgery, with the diagnosis of MASS, RIGHT LOWER LEG.  The various methods of treatment have been discussed with the patient and family. After consideration of risks, benefits and other options for treatment, the patient has consented to  Procedure(s): EXCISION MASS LOWER EXTREMITY (Right) as a surgical intervention.  The patient's history has been reviewed, patient examined, no change in status, stable for surgery.  I have reviewed the patient's chart and labs.  Questions were answered to the patient's satisfaction.    Marked for Incisional Biopsy, no plans to remove the whole mass as this would require reconstruction and flaps. Dr. Ellin Saba wants more tissue for diagnosis and requested and incisional biopsy/ partial removal to get the diagnosis. Ebony Hail, the son.   Lucretia Roers

## 2023-02-18 NOTE — Anesthesia Postprocedure Evaluation (Signed)
Anesthesia Post Note  Patient: Leslie Williamson  Procedure(s) Performed: Incisional Biopsy Right Lower Extremity (Right: Leg Lower)  Patient location during evaluation: PACU Anesthesia Type: General Level of consciousness: awake and alert Pain management: pain level controlled Vital Signs Assessment: post-procedure vital signs reviewed and stable Respiratory status: spontaneous breathing, nonlabored ventilation, respiratory function stable and patient connected to nasal cannula oxygen Cardiovascular status: blood pressure returned to baseline and stable Postop Assessment: no apparent nausea or vomiting Anesthetic complications: no   There were no known notable events for this encounter.   Last Vitals:  Vitals:   02/18/23 1045 02/18/23 1054  BP: 122/71 (!) 127/90  Pulse: (!) 51 60  Resp: 12 16  Temp:  36.6 C  SpO2: 100% 96%    Last Pain:  Vitals:   02/18/23 1054  TempSrc: Oral  PainSc: 0-No pain                 Tami Blass L Deeksha Cotrell

## 2023-02-18 NOTE — Op Note (Addendum)
[facility] Operative Note  02/18/23  Preoperative Diagnosis: Right lower extremity mass    Postoperative Diagnosis: Same   Procedure(s) Performed: Incisional biopsy of the subcutaneous mass  (1.5cm sq of mass removed, total mass size 5cm)   Surgeon: Leatrice Jewels. Henreitta Leber, MD   Assistants: No qualified resident was available    Anesthesia: Monitored anesthesia care    Anesthesiologist: Dr. Leta Jungling, MD    Specimens:  1.5 cm square portion of RLE mass, sent to pathology fresh and for cytology due to concern for possible lymphoma versus multiple myeloma associated mass    Estimated Blood Loss: Minimal   Blood Replacement: None    Complications: None   Wound Class: Clean   Operative Indications: Leslie Williamson is an 89 yo with a prior history of lymphoma and multiple myleoma that has a right lower extremity mass.  The initial core biopsy showed concern for a malignant hematopoietic neoplasm but Dr. Ellin Saba and the pathologist were also concerned for lymphoma and wanted more tissue. I discussed this with the patient and her son and discussed and incisional biopsy to remove a portion of the mass but not the entire mass for diagnosis. Discussed risk of bleeding, infection, needing more surgery, not helping her pain that she has with the mass.   Findings: Large hard mass of the right lower anterior leg    Procedure: The patient was taken to the operating room and placed supine. Monitored anesthesia was induced. Intravenous antibiotics were administered per protocol.  The right leg was prepped and draped in the usual sterile fashion.   Lidocaine was injected over the mass and deep. A longitudinal incision was made over the mass and using cautery I carried the incision down to the large firm mass, which was in the layer beneath subcutaneous tissue displacing the muscle inferiorly. I opted to excise the mass with cautery given the risk of bleeding with the incisional biopsy. The  mass measured over 5cm and I opted to take a 1.5cm square piece of it to allow for sufficient testing.  This was excised and sent to pathology fresh for testing. The cavity was made hemostatic. Irrigation was done. I could not close any deep tissue as this was surrounded with by the mass. I did close the subcutaneous tissue over the mass with 30 Vicryl interrupted and the skin with a vertical mattress 3-0 Nylon. Bacitracin and sterile bandage were placed.   Final inspection revealed acceptable hemostasis. All counts were correct at the end of the case. The patient was awakened from anesthesia without complication.  The patient went to the PACU in stable condition.   Algis Greenhouse, MD [facility] [address] [place], [place] [address] [phone] (office)

## 2023-02-18 NOTE — Progress Notes (Signed)
[facility]  Updated her son. Can restart eliquis and remove bandage on 12/29.   Will see on 1/9 to get sutures out.   Algis Greenhouse, MD [facility] [address] [place], [place] [address] [phone] (office)

## 2023-02-18 NOTE — Discharge Instructions (Addendum)
Discharge Instructions:  Restart the Eliquis on 02/20/2023. Can remove bandage that day too.   Common Complaints: Pain at the incision site is common.  Some nausea is common and poor appetite with anesthesia. The main goal is to stay hydrated the first few days after surgery.   Diet/ Activity: Diet as tolerated. You may not have an appetite, but it is important to stay hydrated. Drink 64 ounces of water a day. Your appetite will return with time.  Walk everyday for at least 15-20 minutes. Deep cough and move around every 1-2 hours in the first few days after surgery.  Limit excessive movement, lifting > 10 lbs, stretching with the limb if there is an incision on your arm/armpit or leg.   Limit stretching, pulling on your incision if it is located on other parts of your body.  Do not pick at the stitches. Can remove the bandage on 02/20/23.  Can shower after the bandage removal.  Do not place lotions or balms on your incision unless instructed to specifically by Dr. Henreitta Leber.   Medication: Take tylenol and ibuprofen as needed for pain control, alternating every 4-6 hours.  Example:  Tylenol 1000mg  @ 6am, 12noon, 6pm, (Do not exceed 4000mg  of tylenol a day). Ibuprofen 800mg  @ 9am, 3pm, 9pm, 3am (Do not exceed 3600mg  of ibuprofen a day).  Take Roxicodone for breakthrough pain every 4 hours.  Take Colace for constipation related to narcotic pain medication. If you do not have a bowel movement in 2 days, take Miralax over the counter.  Drink plenty of water to also prevent constipation.   Contact Information: If you have questions or concerns, please call our office, [phone], Monday- Thursday 8AM-5PM and Friday 8AM-12Noon.  If it is after hours or on the weekend, please call [facility] Main Number, [phone], [phone] and ask to speak to the surgeon on call for Dr. Henreitta Leber at [facility].

## 2023-02-18 NOTE — Anesthesia Preprocedure Evaluation (Addendum)
Anesthesia Evaluation  Patient identified by MRN, date of birth, ID band Patient awake    Reviewed: Allergy & Precautions, H&P , NPO status , Patient's Chart, lab work & pertinent test results  History of Anesthesia Complications (+) history of anesthetic complications  Airway Mallampati: II   Neck ROM: full    Dental  (+)    Pulmonary former smoker   Pulmonary exam normal breath sounds clear to auscultation       Cardiovascular hypertension, + Peripheral Vascular Disease and +CHF  Normal cardiovascular exam Rhythm:regular Rate:Normal  EF 60% with grade 1 diastolic dysfunction   Neuro/Psych  PSYCHIATRIC DISORDERS Anxiety Depression     Neuromuscular disease    GI/Hepatic ,GERD  ,,  Endo/Other    Renal/GU CRFRenal disease     Musculoskeletal   Abdominal Normal abdominal exam  (+)   Peds  Hematology Non-hodgkin's lymphoma   Anesthesia Other Findings   Reproductive/Obstetrics                             Anesthesia Physical Anesthesia Plan  ASA: 3  Anesthesia Plan: General   Post-op Pain Management: Minimal or no pain anticipated   Induction: Intravenous  PONV Risk Score and Plan: Propofol infusion  Airway Management Planned: Nasal Cannula and Natural Airway  Additional Equipment: None  Intra-op Plan:   Post-operative Plan:   Informed Consent: I have reviewed the patients History and Physical, chart, labs and discussed the procedure including the risks, benefits and alternatives for the proposed anesthesia with the patient or authorized representative who has indicated his/her understanding and acceptance.       Plan Discussed with: CRNA  Anesthesia Plan Comments:         Anesthesia Quick Evaluation

## 2023-02-19 ENCOUNTER — Encounter (HOSPITAL_COMMUNITY): Payer: Self-pay | Admitting: General Surgery

## 2023-02-22 LAB — SURGICAL PATHOLOGY

## 2023-02-24 ENCOUNTER — Inpatient Hospital Stay: Payer: Medicare Other | Attending: Hematology

## 2023-02-24 VITALS — BP 124/80 | Temp 97.2°F | Resp 18

## 2023-02-24 DIAGNOSIS — C9000 Multiple myeloma not having achieved remission: Secondary | ICD-10-CM | POA: Diagnosis not present

## 2023-02-24 DIAGNOSIS — Z87891 Personal history of nicotine dependence: Secondary | ICD-10-CM | POA: Insufficient documentation

## 2023-02-24 DIAGNOSIS — Z7961 Long term (current) use of immunomodulator: Secondary | ICD-10-CM | POA: Diagnosis not present

## 2023-02-24 DIAGNOSIS — N189 Chronic kidney disease, unspecified: Secondary | ICD-10-CM | POA: Insufficient documentation

## 2023-02-24 DIAGNOSIS — Z9081 Acquired absence of spleen: Secondary | ICD-10-CM | POA: Diagnosis not present

## 2023-02-24 DIAGNOSIS — D631 Anemia in chronic kidney disease: Secondary | ICD-10-CM | POA: Insufficient documentation

## 2023-02-24 DIAGNOSIS — I2699 Other pulmonary embolism without acute cor pulmonale: Secondary | ICD-10-CM | POA: Insufficient documentation

## 2023-02-24 DIAGNOSIS — E7800 Pure hypercholesterolemia, unspecified: Secondary | ICD-10-CM | POA: Diagnosis not present

## 2023-02-24 DIAGNOSIS — Z5112 Encounter for antineoplastic immunotherapy: Secondary | ICD-10-CM | POA: Diagnosis not present

## 2023-02-24 DIAGNOSIS — I130 Hypertensive heart and chronic kidney disease with heart failure and stage 1 through stage 4 chronic kidney disease, or unspecified chronic kidney disease: Secondary | ICD-10-CM | POA: Diagnosis not present

## 2023-02-24 DIAGNOSIS — Z7901 Long term (current) use of anticoagulants: Secondary | ICD-10-CM | POA: Insufficient documentation

## 2023-02-24 DIAGNOSIS — E876 Hypokalemia: Secondary | ICD-10-CM | POA: Diagnosis not present

## 2023-02-24 DIAGNOSIS — D75839 Thrombocytosis, unspecified: Secondary | ICD-10-CM | POA: Insufficient documentation

## 2023-02-24 DIAGNOSIS — I739 Peripheral vascular disease, unspecified: Secondary | ICD-10-CM | POA: Insufficient documentation

## 2023-02-24 DIAGNOSIS — Z79899 Other long term (current) drug therapy: Secondary | ICD-10-CM | POA: Insufficient documentation

## 2023-02-24 DIAGNOSIS — Z79624 Long term (current) use of inhibitors of nucleotide synthesis: Secondary | ICD-10-CM | POA: Insufficient documentation

## 2023-02-24 DIAGNOSIS — D509 Iron deficiency anemia, unspecified: Secondary | ICD-10-CM | POA: Insufficient documentation

## 2023-02-24 DIAGNOSIS — Z8572 Personal history of non-Hodgkin lymphomas: Secondary | ICD-10-CM | POA: Diagnosis not present

## 2023-02-24 DIAGNOSIS — Z7984 Long term (current) use of oral hypoglycemic drugs: Secondary | ICD-10-CM | POA: Insufficient documentation

## 2023-02-24 LAB — CBC WITH DIFFERENTIAL/PLATELET
Abs Immature Granulocytes: 0.03 10*3/uL (ref 0.00–0.07)
Basophils Absolute: 0.1 10*3/uL (ref 0.0–0.1)
Basophils Relative: 1 %
Eosinophils Absolute: 0.1 10*3/uL (ref 0.0–0.5)
Eosinophils Relative: 2 %
HCT: 30.7 % — ABNORMAL LOW (ref 36.0–46.0)
Hemoglobin: 9.4 g/dL — ABNORMAL LOW (ref 12.0–15.0)
Immature Granulocytes: 1 %
Lymphocytes Relative: 17 %
Lymphs Abs: 1.1 10*3/uL (ref 0.7–4.0)
MCH: 26.6 pg (ref 26.0–34.0)
MCHC: 30.6 g/dL (ref 30.0–36.0)
MCV: 87 fL (ref 80.0–100.0)
Monocytes Absolute: 1.4 10*3/uL — ABNORMAL HIGH (ref 0.1–1.0)
Monocytes Relative: 23 %
Neutro Abs: 3.5 10*3/uL (ref 1.7–7.7)
Neutrophils Relative %: 56 %
Platelets: 497 10*3/uL — ABNORMAL HIGH (ref 150–400)
RBC: 3.53 MIL/uL — ABNORMAL LOW (ref 3.87–5.11)
RDW: 19.3 % — ABNORMAL HIGH (ref 11.5–15.5)
WBC: 6.1 10*3/uL (ref 4.0–10.5)
nRBC: 1 % — ABNORMAL HIGH (ref 0.0–0.2)

## 2023-02-24 MED ORDER — SODIUM CHLORIDE 0.9% FLUSH
10.0000 mL | Freq: Once | INTRAVENOUS | Status: AC
Start: 2023-02-24 — End: 2023-02-24

## 2023-02-24 MED ORDER — SODIUM CHLORIDE 0.9% FLUSH
10.0000 mL | Freq: Once | INTRAVENOUS | Status: AC
Start: 2023-02-24 — End: 2023-02-24
  Administered 2023-02-24: 10 mL via INTRAVENOUS

## 2023-02-24 MED ORDER — HEPARIN SOD (PORK) LOCK FLUSH 100 UNIT/ML IV SOLN
500.0000 [IU] | Freq: Once | INTRAVENOUS | Status: AC
Start: 2023-02-24 — End: 2023-02-24
  Administered 2023-02-24: 500 [IU] via INTRAVENOUS

## 2023-02-24 NOTE — Patient Instructions (Signed)
 [facility] - A DEPT OF [facility]  Discharge Instructions: Thank you for choosing [facility] to provide your oncology and hematology care.  If you have a lab appointment with the Cancer Center - please note that after April 8th, 2024, all labs will be drawn in the cancer center.  You do not have to check in or register with the main entrance as you have in the past but will complete your check-in in the cancer center.  Wear comfortable clothing and clothing appropriate for easy access to any Portacath or PICC line.   We strive to give you quality time with your provider. You may need to reschedule your appointment if you arrive late (15 or more minutes).  Arriving late affects you and other patients whose appointments are after yours.  Also, if you miss three or more appointments without notifying the office, you may be dismissed from the clinic at the provider's discretion.      For prescription refill requests, have your pharmacy contact our office and allow 72 hours for refills to be completed.    Today you received the following port flush with labs.   To help prevent nausea and vomiting after your treatment, we encourage you to take your nausea medication as directed.  BELOW ARE SYMPTOMS THAT SHOULD BE REPORTED IMMEDIATELY: *FEVER GREATER THAN 100.4 F (38 C) OR HIGHER *CHILLS OR SWEATING *NAUSEA AND VOMITING THAT IS NOT CONTROLLED WITH YOUR NAUSEA MEDICATION *UNUSUAL SHORTNESS OF BREATH *UNUSUAL BRUISING OR BLEEDING *URINARY PROBLEMS (pain or burning when urinating, or frequent urination) *BOWEL PROBLEMS (unusual diarrhea, constipation, pain near the anus) TENDERNESS IN MOUTH AND THROAT WITH OR WITHOUT PRESENCE OF ULCERS (sore throat, sores in mouth, or a toothache) UNUSUAL RASH, SWELLING OR PAIN  UNUSUAL VAGINAL DISCHARGE OR ITCHING   Items with * indicate a potential emergency and should be followed up as soon as possible or go to  the Emergency Department if any problems should occur.  Please show the CHEMOTHERAPY ALERT CARD or IMMUNOTHERAPY ALERT CARD at check-in to the Emergency Department and triage nurse.  Should you have questions after your visit or need to cancel or reschedule your appointment, please contact [facility] [place] - A DEPT OF [facility] [phone]  and follow the prompts.  Office hours are 8:00 a.m. to 4:30 p.m. Monday - Friday. Please note that voicemails left after 4:00 p.m. may not be returned until the following business day.  We are closed weekends and major holidays. You have access to a nurse at all times for urgent questions. Please call the main number to the clinic [phone] and follow the prompts.  For any non-urgent questions, you may also contact your provider using MyChart. We now offer e-Visits for anyone 6 and older to request care online for non-urgent symptoms. For details visit [email or web address].   Also download the MyChart app! Go to the app store, search "MyChart", open the app, select [place], and log in with your MyChart username and password.

## 2023-02-24 NOTE — Progress Notes (Signed)
 Leslie Williamson presented for Portacath access and flush. Proper placement of portacath confirmed by CXR. Portacath located right  chest wall accessed with  H 20 needle. Good blood return present. Portacath flushed with 20ml NS and 500U/84ml Heparin  and needle removed intact. Procedure without incident. Patient tolerated procedure well.

## 2023-02-25 DIAGNOSIS — C9002 Multiple myeloma in relapse: Secondary | ICD-10-CM | POA: Diagnosis not present

## 2023-02-25 DIAGNOSIS — I5032 Chronic diastolic (congestive) heart failure: Secondary | ICD-10-CM | POA: Diagnosis not present

## 2023-02-25 LAB — KAPPA/LAMBDA LIGHT CHAINS
Kappa free light chain: 51.6 mg/L — ABNORMAL HIGH (ref 3.3–19.4)
Kappa, lambda light chain ratio: 2.72 — ABNORMAL HIGH (ref 0.26–1.65)
Lambda free light chains: 19 mg/L (ref 5.7–26.3)

## 2023-03-01 ENCOUNTER — Ambulatory Visit: Payer: Medicare Other | Admitting: General Surgery

## 2023-03-01 DIAGNOSIS — C8335 Diffuse large B-cell lymphoma, lymph nodes of inguinal region and lower limb: Secondary | ICD-10-CM | POA: Insufficient documentation

## 2023-03-02 ENCOUNTER — Inpatient Hospital Stay: Payer: Medicare Other

## 2023-03-02 ENCOUNTER — Inpatient Hospital Stay: Payer: Medicare Other | Admitting: Hematology

## 2023-03-02 VITALS — BP 113/52 | HR 83 | Temp 98.2°F | Resp 17 | Wt 123.3 lb

## 2023-03-02 DIAGNOSIS — Z7901 Long term (current) use of anticoagulants: Secondary | ICD-10-CM | POA: Diagnosis not present

## 2023-03-02 DIAGNOSIS — I739 Peripheral vascular disease, unspecified: Secondary | ICD-10-CM | POA: Diagnosis not present

## 2023-03-02 DIAGNOSIS — I2699 Other pulmonary embolism without acute cor pulmonale: Secondary | ICD-10-CM | POA: Diagnosis not present

## 2023-03-02 DIAGNOSIS — C8335 Diffuse large B-cell lymphoma, lymph nodes of inguinal region and lower limb: Secondary | ICD-10-CM | POA: Diagnosis not present

## 2023-03-02 DIAGNOSIS — Z9081 Acquired absence of spleen: Secondary | ICD-10-CM | POA: Diagnosis not present

## 2023-03-02 DIAGNOSIS — Z8572 Personal history of non-Hodgkin lymphomas: Secondary | ICD-10-CM | POA: Diagnosis not present

## 2023-03-02 DIAGNOSIS — Z79899 Other long term (current) drug therapy: Secondary | ICD-10-CM | POA: Diagnosis not present

## 2023-03-02 DIAGNOSIS — D75839 Thrombocytosis, unspecified: Secondary | ICD-10-CM | POA: Diagnosis not present

## 2023-03-02 DIAGNOSIS — D509 Iron deficiency anemia, unspecified: Secondary | ICD-10-CM | POA: Diagnosis not present

## 2023-03-02 DIAGNOSIS — Z7961 Long term (current) use of immunomodulator: Secondary | ICD-10-CM | POA: Diagnosis not present

## 2023-03-02 DIAGNOSIS — E876 Hypokalemia: Secondary | ICD-10-CM | POA: Diagnosis not present

## 2023-03-02 DIAGNOSIS — N189 Chronic kidney disease, unspecified: Secondary | ICD-10-CM | POA: Diagnosis not present

## 2023-03-02 DIAGNOSIS — E7800 Pure hypercholesterolemia, unspecified: Secondary | ICD-10-CM | POA: Diagnosis not present

## 2023-03-02 DIAGNOSIS — C9000 Multiple myeloma not having achieved remission: Secondary | ICD-10-CM

## 2023-03-02 DIAGNOSIS — I130 Hypertensive heart and chronic kidney disease with heart failure and stage 1 through stage 4 chronic kidney disease, or unspecified chronic kidney disease: Secondary | ICD-10-CM | POA: Diagnosis not present

## 2023-03-02 DIAGNOSIS — Z79624 Long term (current) use of inhibitors of nucleotide synthesis: Secondary | ICD-10-CM | POA: Diagnosis not present

## 2023-03-02 DIAGNOSIS — Z5112 Encounter for antineoplastic immunotherapy: Secondary | ICD-10-CM | POA: Diagnosis not present

## 2023-03-02 DIAGNOSIS — D631 Anemia in chronic kidney disease: Secondary | ICD-10-CM | POA: Diagnosis not present

## 2023-03-02 DIAGNOSIS — Z7984 Long term (current) use of oral hypoglycemic drugs: Secondary | ICD-10-CM | POA: Diagnosis not present

## 2023-03-02 DIAGNOSIS — Z87891 Personal history of nicotine dependence: Secondary | ICD-10-CM | POA: Diagnosis not present

## 2023-03-02 LAB — CBC WITH DIFFERENTIAL/PLATELET
Abs Immature Granulocytes: 0.05 10*3/uL (ref 0.00–0.07)
Basophils Absolute: 0.1 10*3/uL (ref 0.0–0.1)
Basophils Relative: 1 %
Eosinophils Absolute: 0 10*3/uL (ref 0.0–0.5)
Eosinophils Relative: 0 %
HCT: 31 % — ABNORMAL LOW (ref 36.0–46.0)
Hemoglobin: 9.6 g/dL — ABNORMAL LOW (ref 12.0–15.0)
Immature Granulocytes: 1 %
Lymphocytes Relative: 35 %
Lymphs Abs: 2.8 10*3/uL (ref 0.7–4.0)
MCH: 26.8 pg (ref 26.0–34.0)
MCHC: 31 g/dL (ref 30.0–36.0)
MCV: 86.6 fL (ref 80.0–100.0)
Monocytes Absolute: 0.5 10*3/uL (ref 0.1–1.0)
Monocytes Relative: 6 %
Neutro Abs: 4.5 10*3/uL (ref 1.7–7.7)
Neutrophils Relative %: 57 %
Platelets: 500 10*3/uL — ABNORMAL HIGH (ref 150–400)
RBC: 3.58 MIL/uL — ABNORMAL LOW (ref 3.87–5.11)
RDW: 19.2 % — ABNORMAL HIGH (ref 11.5–15.5)
WBC: 7.9 10*3/uL (ref 4.0–10.5)
nRBC: 1.5 % — ABNORMAL HIGH (ref 0.0–0.2)

## 2023-03-02 LAB — URIC ACID: Uric Acid, Serum: 5.9 mg/dL (ref 2.5–7.1)

## 2023-03-02 LAB — LACTATE DEHYDROGENASE: LDH: 122 U/L (ref 98–192)

## 2023-03-02 MED ORDER — HEPARIN SOD (PORK) LOCK FLUSH 100 UNIT/ML IV SOLN
500.0000 [IU] | Freq: Once | INTRAVENOUS | Status: AC
  Administered 2023-03-02: 500 [IU] via INTRAVENOUS

## 2023-03-02 MED ORDER — ACETAMINOPHEN 325 MG PO TABS
650.0000 mg | ORAL_TABLET | Freq: Once | ORAL | Status: AC
  Administered 2023-03-02: 650 mg via ORAL
  Filled 2023-03-02: qty 2

## 2023-03-02 MED ORDER — HEPARIN SOD (PORK) LOCK FLUSH 100 UNIT/ML IV SOLN: 500.0000 [IU] | Freq: Once | INTRAVENOUS | Status: DC

## 2023-03-02 MED ORDER — CETIRIZINE HCL 10 MG PO TABS
10.0000 mg | ORAL_TABLET | Freq: Once | ORAL | Status: AC
  Administered 2023-03-02: 10 mg via ORAL
  Filled 2023-03-02: qty 1

## 2023-03-02 MED ORDER — SODIUM CHLORIDE 0.9% FLUSH
10.0000 mL | INTRAVENOUS | Status: DC | PRN
  Administered 2023-03-02: 10 mL via INTRAVENOUS

## 2023-03-02 MED ORDER — DARATUMUMAB-HYALURONIDASE-FIHJ 1800-30000 MG-UT/15ML ~~LOC~~ SOLN
1800.0000 mg | Freq: Once | SUBCUTANEOUS | Status: AC
  Administered 2023-03-02: 1800 mg via SUBCUTANEOUS
  Filled 2023-03-02: qty 15

## 2023-03-02 NOTE — Progress Notes (Signed)
 Ok to proceed with treatment today per MD.   Patient is taking revlimid  as prescribed.  Patient  has not missed any doses and reports no side effects at this time.     Treatment given per orders. Patient tolerated it well without problems. Vitals stable and discharged home from clinic via wheelchair. Follow up as scheduled.

## 2023-03-02 NOTE — Patient Instructions (Addendum)
 [facility] at [facility] Discharge Instructions   You were seen and examined today by Dr. Rogers.  He reviewed the results of your biopsy which shows that you have a cancer of the lymph nodes called lymphoma. The type you have is called large B cell lymphoma.  We will arrange for you to have a PET scan to see if the lymphoma is anywhere else in the body.   We will see you back after the PET scan to discuss treatment options.  Return as scheduled.    Thank you for choosing [facility] at [facility] to provide your oncology and hematology care.  To afford each patient quality time with our provider, please arrive at least 15 minutes before your scheduled appointment time.   If you have a lab appointment with the Cancer Center please come in thru the Main Entrance and check in at the main information desk.  You need to re-schedule your appointment should you arrive 10 or more minutes late.  We strive to give you quality time with our providers, and arriving late affects you and other patients whose appointments are after yours.  Also, if you no show three or more times for appointments you may be dismissed from the clinic at the providers discretion.     Again, thank you for choosing [facility].  Our hope is that these requests will decrease the amount of time that you wait before being seen by our physicians.       _____________________________________________________________  Should you have questions after your visit to [facility], please contact our office at [phone] and follow the prompts.  Our office hours are 8:00 a.m. and 4:30 p.m. Monday - Friday.  Please note that voicemails left after 4:00 p.m. may not be returned until the following business day.  We are closed weekends and major holidays.  You do have access to a nurse 24-7, just call the main number to the clinic [phone] and do not press any  options, hold on the line and a nurse will answer the phone.    For prescription refill requests, have your pharmacy contact our office and allow 72 hours.    Due to Covid, you will need to wear a mask upon entering the hospital. If you do not have a mask, a mask will be given to you at the Main Entrance upon arrival. For doctor visits, patients may have 1 support person age 88 or older with them. For treatment visits, patients can not have anyone with them due to social distancing guidelines and our immunocompromised population.

## 2023-03-02 NOTE — Progress Notes (Signed)
 [facility] [address], [place] [address]    Clinic Day:  03/02/2023  Referring physician: [name], [name] Demissie*  Patient Care Team: Carlette Benita Area, MD as PCP - General (Internal Medicine) Alvan Dorn FALCON, MD as PCP - Cardiology (Cardiology) Rogers Hai, MD as Medical Oncologist (Medical Oncology)   ASSESSMENT & PLAN:   Assessment: 1.  Biclonal IgG lambda and IgM kappa multiple myeloma, high risk: - BMBX (11/02/2021): IgG lambda plasma cells 70% - Myeloma FISH panel: Monosomy 13 (standard GIST), duplication of 1 q. (high risk), gain of 11 q. (standard risk). - Cytogenetics: Hyperdiploid E weight gain (trisomy/trisomy) of odd-numbered chromosomes and monosomy 13, standard risk.  Gain of 1 q. associated with progressive course. - PET scan (11/05/2021): No evidence of multiple myeloma.  No hypermetabolic adenopathy. - Daratumumab , Revlimid  and dexamethasone  cycle 1 started on 11/30/2021   2.  JAK2 positive essential thrombocytosis: - BMBX (11/02/2021): No evidence of fibrosis.  No morphological evidence of JAK2 positive ET.   3.  History of marginal zone lymphoma of the spleen: - Diagnosed in 1999, status post CHOP x7 cycles followed by rituximab  1 cycle. - Status post splenectomy.  No evidence of lymphoma on current PET scan.   4.  Social/family history: - She lives with her sister.  Son lives 30 minutes away.  She is independent of ADLs and some IADLs.  She drives.  She worked at [employer] and denies any exposure to chemicals or pesticides.  No family history of malignancies.  5.  Bone protection (DEXA scan 02/17/2022 T-score -0.9): - She saw dentist who recommended dental extractions.  She does not want zoledronic acid or denosumab.    Plan: 1.  IgG lambda multiple myeloma: - She is tolerating Revlimid  reasonably well.  She is taking dexamethasone  10 mg on Wednesdays. - Reviewed myeloma labs from 02/24/2023: Kappa free  light chains are 51, stable.  Ratio is 2.72, increased from previous value of 2.12.  SPEP and immunofixation are pending.  Will follow-up on them. - She will continue Revlimid  10 mg 3 weeks on/1 week off.  Continue monthly Darzalex .  Will hold myeloma therapy once we initiate treatment for large B-cell lymphoma.   2.  Anemia from CKD and iron  deficiency: - Last Feraheme  on 09/24/2022. - Hemoglobin today is 9.6.  Will consider checking ferritin and iron  panel.   3.  ID prophylaxis: - Continue acyclovir  twice daily.   4.  Pulmonary embolism (Dx 07/03/2022):: - Continue Eliquis  twice daily indefinitely.  No bleeding issues.   5.  Hypokalemia: - Continue potassium 40 mg daily.  Last potassium is 3.5.  6.  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 - Initial needle biopsy on 01/19/2023 showed malignant hematopoietic neoplasm. - Excision biopsy on 02/18/2023: Diffuse large B-cell lymphoma. - I have discussed the new diagnosis with the patient and her son today. - Recommended further workup with whole-body PET/CT scan.  Will also check LDH, beta-2  microglobulin. - Will obtain 2D echocardiogram. - Will plan for systemic therapy with 6 cycles of R mini CHOP.  Will have to hold myeloma therapy when chemotherapy is initiated.    Orders Placed This Encounter  Procedures   NM PET Image Restage (PS) Whole Body    Standing Status:   Future    Expected Date:   03/09/2023  Expiration Date:   03/01/2024    If indicated for the ordered procedure, I authorize the administration of a radiopharmaceutical per Radiology protocol:   Yes    Preferred imaging location?:   [facility]   Beta 2 microglobuline, serum    Standing Status:   Future    Number of Occurrences:   1    Expected Date:   03/02/2023    Expiration Date:   03/01/2024   Lactate dehydrogenase    Standing Status:   Future    Number of  Occurrences:   1    Expected Date:   03/02/2023    Expiration Date:   03/01/2024   Uric acid    Standing Status:   Future    Number of Occurrences:   1    Expected Date:   03/02/2023    Expiration Date:   03/01/2024    Release to patient:   Immediate   ECHOCARDIOGRAM COMPLETE    Standing Status:   Future    Expected Date:   03/09/2023    Expiration Date:   03/01/2024    Where should this test be performed:   [facility]    Perflutren  DEFINITY  (image enhancing agent) should be administered unless hypersensitivity or allergy exist:   Administer Perflutren     Reason for exam-Echo:   Chemo  Z09       Alean Stands, MD   1/8/20255:40 PM  CHIEF COMPLAINT:   Diagnosis: multiple myeloma    Cancer Staging  Multiple myeloma (HCC) Staging form: Plasma Cell Myeloma and Plasma Cell Disorders, AJCC 8th Edition - Clinical stage from 11/16/2021: Albumin (g/dL): 3.1, ISS: Stage II, High-risk cytogenetics: Absent, LDH: Normal - Unsigned  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) Staging form: Primary Cutaneous B-Cell/T-Cell Lymphoma (Non-MF/SS Lymphoma), AJCC 8th Edition - Clinical stage from 03/01/2023: cT1a, cN0, cM0 - Unsigned    Prior Therapy: none  Current Therapy:  Daratumumab , Revlimid  and dexamethasone     HISTORY OF PRESENT ILLNESS:   Oncology History  NHL (non-Hodgkin's lymphoma) (HCC)  07/19/1995 Pathology Results   Spleen biopsy with resection- low-grade B-cell lymphoma, splenic marginal zone type   02/20/1998 Imaging   CT CAP-marked and extensive cervical adenopathy   02/25/1998 Pathology Results   Right cervical lymph node demonstrating large B-cell lymphoma   03/05/1998 Bone Marrow Biopsy   No evidence of bone marrow involvement   03/11/1998 - 07/18/1998 Chemotherapy   CHOP x 7 cycles   05/09/1998 Imaging   CT CAP and neck- slight interval decrease in size of para-aortic adenopathy with interval decrease in right inguinal and bi-iliac  adenopathy.  Interval decrease in size and number of enlarged cervical nodes   07/10/1998 Imaging   Ct CAP and neck- unremarkable CT of chest. No enlarged retroperitoneal adenopathy withthe previously noted retroperitoneal nodes currently smaller to stable in size.  Cervical adenopathy is stable to slightly smaller in size.   08/18/1998 - 09/08/1998 Chemotherapy   Rituxan  Day 1, 8, 15, 22 x 1 cycle   01/08/1999 Remission   CT CAP and neck demonstrates no adenopathy or disease   01/08/1999 Imaging   CT CAP and neck- Stable CT of chest. Stable CT abd/pelvis without adenopathy.  Negative CT of neck   Multiple myeloma (HCC)  11/16/2021 Initial Diagnosis   Multiple myeloma (HCC)   11/30/2021 -  Chemotherapy   Patient is on Treatment Plan : MYELOMA  Daratumumab  SQ + Lenalidomide  + Dexamethasone  (DaraRd) q28d  INTERVAL HISTORY:   Leslie Williamson is a 89 y.o. female seen for follow-up of multiple myeloma.  She had recent excision biopsy of the right leg mass.  Reports appetite 90% and energy level 75%.  PAST MEDICAL HISTORY:   Past Medical History: 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   Vertigo, labyrinthine     Surgical History: Past Surgical History:  Procedure Laterality Date   CATARACT EXTRACTION W/PHACO Left 11/03/2015   Procedure: CATARACT EXTRACTION PHACO AND INTRAOCULAR LENS PLACEMENT  (IOC);  Surgeon: Cherene Mania, MD;  Location: [facility];  Service: Ophthalmology;  Laterality: Left;  CDE: 7.74   CATARACT EXTRACTION W/PHACO Right 11/20/2015   Procedure: CATARACT EXTRACTION PHACO AND INTRAOCULAR LENS PLACEMENT ; CDE:  8.30;  Surgeon: Cherene Mania, MD;  Location: [facility];  Service: Ophthalmology;  Laterality: Right;   IR IMAGING GUIDED PORT INSERTION  11/04/2021   MASS EXCISION Right 02/18/2023   Procedure: Incisional Biopsy Right Lower Extremity;  Surgeon: Kallie Manuelita BROCKS, MD;  Location: [facility];  Service: General;  Laterality: Right;   PORT-A-CATH REMOVAL      Social History: Social History   Socioeconomic History   Marital status: Widowed    Spouse name: Not on file   Number of children: Not on file   Years of education: Not on file   Highest education level: Not on file  Occupational History   Not on file  Tobacco Use   Smoking status: Former   Smokeless tobacco: Never  Vaping Use   Vaping status: Never Used  Substance and Sexual Activity   Alcohol  use: No   Drug use: No   Sexual activity: Not Currently  Other Topics Concern   Not on file  Social History Narrative   Not on file   Social Drivers of Health   Financial Resource Strain: Low Risk  (12/31/2019)   Overall Financial Resource Strain (CARDIA)    Difficulty of Paying Living Expenses: Not hard at all  Food Insecurity: No Food Insecurity (07/03/2022)   Hunger Vital Sign    Worried About Running Out of Food in the Last Year: Never true    Ran Out of Food in the Last Year: Never true  Transportation Needs: No Transportation Needs (07/03/2022)   PRAPARE - Administrator, Civil Service (Medical): No    Lack of Transportation (Non-Medical): No  Physical Activity: Inactive (12/31/2019)   Exercise Vital Sign    Days of Exercise per Week: 0 days    Minutes of Exercise per Session: 0 min  Stress: No Stress Concern Present (12/31/2019)   [employer] of Occupational Health - Occupational Stress  Questionnaire    Feeling of Stress : Not at all  Social Connections: Moderately Isolated (12/31/2019)   Social Connection and Isolation Panel [NHANES]  Frequency of Communication with Friends and Family: More than three times a week    Frequency of Social Gatherings with Friends and Family: More than three times a week    Attends Religious Services: More than 4 times per year    Active Member of [employer] or Organizations: No    Attends Banker Meetings: Never    Marital Status: Widowed  Intimate Partner Violence: Not At Risk (07/03/2022)   Humiliation, Afraid, Rape, and Kick questionnaire    Fear of Current or Ex-Partner: No    Emotionally Abused: No    Physically Abused: No    Sexually Abused: No    Family History: Family History  Problem Relation Age of Onset   Diabetes Mother     Current Medications:  Current Outpatient Medications:    acetaminophen  (TYLENOL ) 500 MG tablet, Take 1,000 mg by mouth every 6 (six) hours as needed for moderate pain., Disp: , Rfl:    acyclovir  (ZOVIRAX ) 400 MG tablet, Take 1 tablet (400 mg total) by mouth 2 (two) times daily., Disp: 60 tablet, Rfl: 6   apixaban  (ELIQUIS ) 5 MG TABS tablet, Take 1 tablet (5 mg total) by mouth 2 (two) times daily., Disp: 60 tablet, Rfl: 1   atorvastatin  (LIPITOR) 40 MG tablet, Take 40 mg by mouth at bedtime., Disp: , Rfl:    dapagliflozin  propanediol (FARXIGA ) 10 MG TABS tablet, Take 1 tablet (10 mg total) by mouth daily., Disp: 30 tablet, Rfl: 1   dexamethasone  (DECADRON ) 4 MG tablet, Take 10 mg (2.5 tablets) by mouth weekly every Wednesday morning Per note by Dr. Rogers on 09/15/2022, Disp: 20 tablet, Rfl: 6   furosemide  (LASIX ) 40 MG tablet, Take 40mg  as needed if you gain more than 3 pounds in 1 day or 5 pounds in 1 week (Patient taking differently: Take 40 mg by mouth daily. Take 40mg  as needed if you gain more than 3 pounds in 1 day or 5 pounds in 1 week), Disp: 30 tablet, Rfl: 1   lenalidomide   (REVLIMID ) 10 MG capsule, Take 1 capsule (10 mg total) by mouth daily. Take for 21 days on, 7 days off., Disp: 21 capsule, Rfl: 0   lidocaine -prilocaine  (EMLA ) cream, Apply 1 Application topically as needed., Disp: 30 g, Rfl: 1   metoprolol  succinate (TOPROL -XL) 25 MG 24 hr tablet, Take 0.5 tablets (12.5 mg total) by mouth daily., Disp: 30 tablet, Rfl: 0   ondansetron  (ZOFRAN ) 4 MG tablet, Take 1 tablet (4 mg total) by mouth every 8 (eight) hours as needed., Disp: 30 tablet, Rfl: 1   oxyCODONE  (ROXICODONE ) 5 MG immediate release tablet, Take 1 tablet (5 mg total) by mouth every 4 (four) hours as needed for severe pain (pain score 7-10) or breakthrough pain., Disp: 5 tablet, Rfl: 0   potassium chloride  SA (KLOR-CON  M20) 20 MEQ tablet, Take 1 tablet on the days you take lasix  (Patient taking differently: Take 20 mEq by mouth 2 (two) times daily.), Disp: 180 tablet, Rfl: 3 No current facility-administered medications for this visit.  Facility-Administered Medications Ordered in Other Visits:    heparin  lock flush 100 unit/mL, 500 Units, Intravenous, Once, Rogers Hai, MD   sodium chloride  flush (NS) 0.9 % injection 10 mL, 10 mL, Intravenous, PRN, Katerine Morua, MD, 10 mL at 03/02/23 1206   Allergies: Allergies  Allergen Reactions   Penicillins Rash    REVIEW OF SYSTEMS:   Review of Systems  Constitutional:  Negative for chills, fatigue and fever.  HENT:  Negative for lump/mass, mouth sores, nosebleeds, sore throat and trouble swallowing.   Eyes:  Negative for eye problems.  Respiratory:  Negative for cough and shortness of breath.   Cardiovascular:  Negative for chest pain, leg swelling and palpitations.  Gastrointestinal:  Negative for abdominal pain, constipation, diarrhea, nausea and vomiting.  Genitourinary:  Negative for bladder incontinence, difficulty urinating, dysuria, frequency, hematuria and nocturia.   Musculoskeletal:  Negative for arthralgias, back pain, flank  pain, myalgias and neck pain.  Skin:  Negative for itching and rash.  Neurological:  Negative for dizziness, headaches and numbness.  Hematological:  Does not bruise/bleed easily.  Psychiatric/Behavioral:  Negative for depression, sleep disturbance and suicidal ideas. The patient is not nervous/anxious.   All other systems reviewed and are negative.    VITALS:   Blood pressure (!) 113/52, pulse 83, temperature 98.2 F (36.8 C), temperature source Oral, resp. rate 17, weight 123 lb 4.8 oz (55.9 kg), SpO2 93%.  Wt Readings from Last 3 Encounters:  03/02/23 123 lb 4.8 oz (55.9 kg)  02/18/23 119 lb 0.8 oz (54 kg)  02/08/23 118 lb (53.5 kg)    Body mass index is 20.52 kg/m.  Performance status (ECOG): 1 - Symptomatic but completely ambulatory  PHYSICAL EXAM:   Physical Exam Vitals and nursing note reviewed. Exam conducted with a chaperone present.  Constitutional:      Appearance: Normal appearance.  Cardiovascular:     Rate and Rhythm: Normal rate and regular rhythm.     Pulses: Normal pulses.     Heart sounds: Normal heart sounds.  Pulmonary:     Effort: Pulmonary effort is normal.     Breath sounds: Normal breath sounds.  Abdominal:     Palpations: Abdomen is soft. There is no hepatomegaly, splenomegaly or mass.     Tenderness: There is no abdominal tenderness.  Musculoskeletal:     Right lower leg: 2+ Edema present.     Left lower leg: 2+ Edema present.  Lymphadenopathy:     Cervical: No cervical adenopathy.     Right cervical: No superficial, deep or posterior cervical adenopathy.    Left cervical: No superficial, deep or posterior cervical adenopathy.     Upper Body:     Right upper body: No supraclavicular or axillary adenopathy.     Left upper body: No supraclavicular or axillary adenopathy.  Neurological:     General: No focal deficit present.     Mental Status: She is alert and oriented to person, place, and time.  Psychiatric:        Mood and Affect: Mood  normal.        Behavior: Behavior normal.     LABS:      Latest Ref Rng & Units 03/02/2023   12:31 PM 02/24/2023   10:01 AM 02/18/2023    8:48 AM  CBC  WBC 4.0 - 10.5 K/uL 7.9  6.1    Hemoglobin 12.0 - 15.0 g/dL 9.6  9.4  89.4   Hematocrit 36.0 - 46.0 % 31.0  30.7  31.0   Platelets 150 - 400 K/uL 500  497        Latest Ref Rng & Units 02/18/2023    8:48 AM 01/03/2023    2:38 PM 12/08/2022   12:08 PM  CMP  Glucose 70 - 99 mg/dL 77  79  865   BUN 8 - 23 mg/dL 20  14  17    Creatinine 0.44 - 1.00 mg/dL 8.49  8.97  8.65  Sodium 135 - 145 mmol/L 138  139  138   Potassium 3.5 - 5.1 mmol/L 3.5  3.1  3.7   Chloride 98 - 111 mmol/L 111  108  106   CO2 22 - 32 mmol/L  24  24   Calcium  8.9 - 10.3 mg/dL  8.3  8.2   Total Protein 6.5 - 8.1 g/dL   5.3   Total Bilirubin 0.3 - 1.2 mg/dL   0.7   Alkaline Phos 38 - 126 U/L   56   AST 15 - 41 U/L   14   ALT 0 - 44 U/L   15      No results found for: CEA1, CEA / No results found for: CEA1, CEA No results found for: PSA1 No results found for: CAN199 No results found for: RJW874  Lab Results  Component Value Date   TOTALPROTELP 5.0 (L) 11/10/2022   TOTALPROTELP 4.8 (L) 11/10/2022   ALBUMINELP 2.9 11/10/2022   A1GS 0.3 11/10/2022   A2GS 0.6 11/10/2022   BETS 0.8 11/10/2022   GAMS 0.4 11/10/2022   MSPIKE 0.1 (H) 11/10/2022   SPEI Comment 11/10/2022   Lab Results  Component Value Date   TIBC 375 09/15/2022   TIBC 249 (L) 03/10/2022   TIBC 291 09/30/2021   FERRITIN 7 (L) 09/15/2022   FERRITIN 93 03/10/2022   FERRITIN 45 09/30/2021   IRONPCTSAT 5 (L) 09/15/2022   IRONPCTSAT 8 (L) 03/10/2022   IRONPCTSAT 19 09/30/2021   Lab Results  Component Value Date   LDH 122 03/02/2023   LDH 154 09/30/2021   LDH 148 08/05/2021     STUDIES:   No results found.

## 2023-03-02 NOTE — Patient Instructions (Signed)
 [facility] - A DEPT OF [facility]  Discharge Instructions: Thank you for choosing [facility] to provide your oncology and hematology care.  If you have a lab appointment with the Cancer Center - please note that after April 8th, 2024, all labs will be drawn in the cancer center.  You do not have to check in or register with the main entrance as you have in the past but will complete your check-in in the cancer center.  Wear comfortable clothing and clothing appropriate for easy access to any Portacath or PICC line.   We strive to give you quality time with your provider. You may need to reschedule your appointment if you arrive late (15 or more minutes).  Arriving late affects you and other patients whose appointments are after yours.  Also, if you miss three or more appointments without notifying the office, you may be dismissed from the clinic at the provider's discretion.      For prescription refill requests, have your pharmacy contact our office and allow 72 hours for refills to be completed.    Today you received the following chemotherapy and/or immunotherapy agents darzalex  faspro injection   To help prevent nausea and vomiting after your treatment, we encourage you to take your nausea medication as directed.  BELOW ARE SYMPTOMS THAT SHOULD BE REPORTED IMMEDIATELY: *FEVER GREATER THAN 100.4 F (38 C) OR HIGHER *CHILLS OR SWEATING *NAUSEA AND VOMITING THAT IS NOT CONTROLLED WITH YOUR NAUSEA MEDICATION *UNUSUAL SHORTNESS OF BREATH *UNUSUAL BRUISING OR BLEEDING *URINARY PROBLEMS (pain or burning when urinating, or frequent urination) *BOWEL PROBLEMS (unusual diarrhea, constipation, pain near the anus) TENDERNESS IN MOUTH AND THROAT WITH OR WITHOUT PRESENCE OF ULCERS (sore throat, sores in mouth, or a toothache) UNUSUAL RASH, SWELLING OR PAIN  UNUSUAL VAGINAL DISCHARGE OR ITCHING   Items with * indicate a potential emergency and should  be followed up as soon as possible or go to the Emergency Department if any problems should occur.  Please show the CHEMOTHERAPY ALERT CARD or IMMUNOTHERAPY ALERT CARD at check-in to the Emergency Department and triage nurse.  Should you have questions after your visit or need to cancel or reschedule your appointment, please contact [facility] [place] - A DEPT OF [facility] [phone]  and follow the prompts.  Office hours are 8:00 a.m. to 4:30 p.m. Monday - Friday. Please note that voicemails left after 4:00 p.m. may not be returned until the following business day.  We are closed weekends and major holidays. You have access to a nurse at all times for urgent questions. Please call the main number to the clinic [phone] and follow the prompts.  For any non-urgent questions, you may also contact your provider using MyChart. We now offer e-Visits for anyone 23 and older to request care online for non-urgent symptoms. For details visit [email or web address].   Also download the MyChart app! Go to the app store, search MyChart, open the app, select [place], and log in with your MyChart username and password.

## 2023-03-03 ENCOUNTER — Encounter: Payer: Self-pay | Admitting: General Surgery

## 2023-03-03 ENCOUNTER — Ambulatory Visit (INDEPENDENT_AMBULATORY_CARE_PROVIDER_SITE_OTHER): Payer: Medicare Other | Admitting: General Surgery

## 2023-03-03 ENCOUNTER — Ambulatory Visit (HOSPITAL_COMMUNITY)
Admission: RE | Admit: 2023-03-03 | Discharge: 2023-03-03 | Disposition: A | Discharge status: Home / Self Care | Payer: Medicare Other | Source: Ambulatory Visit | Attending: Hematology | Admitting: Hematology

## 2023-03-03 VITALS — BP 146/73 | HR 66 | Temp 98.3°F | Resp 12 | Ht 65.0 in | Wt 123.0 lb

## 2023-03-03 DIAGNOSIS — C8335 Diffuse large B-cell lymphoma, lymph nodes of inguinal region and lower limb: Secondary | ICD-10-CM | POA: Insufficient documentation

## 2023-03-03 DIAGNOSIS — I110 Hypertensive heart disease with heart failure: Secondary | ICD-10-CM | POA: Insufficient documentation

## 2023-03-03 DIAGNOSIS — E785 Hyperlipidemia, unspecified: Secondary | ICD-10-CM | POA: Diagnosis not present

## 2023-03-03 DIAGNOSIS — I509 Heart failure, unspecified: Secondary | ICD-10-CM | POA: Insufficient documentation

## 2023-03-03 DIAGNOSIS — Z8616 Personal history of COVID-19: Secondary | ICD-10-CM | POA: Insufficient documentation

## 2023-03-03 DIAGNOSIS — Z0189 Encounter for other specified special examinations: Secondary | ICD-10-CM | POA: Diagnosis not present

## 2023-03-03 DIAGNOSIS — Z01818 Encounter for other preprocedural examination: Secondary | ICD-10-CM | POA: Insufficient documentation

## 2023-03-03 DIAGNOSIS — C8330 Diffuse large B-cell lymphoma, unspecified site: Secondary | ICD-10-CM

## 2023-03-03 LAB — ECHOCARDIOGRAM COMPLETE
AR max vel: 2.01 cm2
AV Area VTI: 2.43 cm2
AV Area mean vel: 2.4 cm2
AV Mean grad: 3.1 mm[Hg]
AV Peak grad: 8 mm[Hg]
Ao pk vel: 1.41 m/s
Area-P 1/2: 3.03 cm2
Calc EF: 55 %
S' Lateral: 2.9 cm
Single Plane A2C EF: 53.9 %
Single Plane A4C EF: 58.4 %

## 2023-03-03 LAB — IMMUNOFIXATION ELECTROPHORESIS
IgA: 220 mg/dL (ref 64–422)
IgG (Immunoglobin G), Serum: 421 mg/dL — ABNORMAL LOW (ref 586–1602)
IgM (Immunoglobulin M), Srm: 42 mg/dL (ref 26–217)
Total Protein ELP: 5.3 g/dL — ABNORMAL LOW (ref 6.0–8.5)

## 2023-03-03 LAB — BETA 2 MICROGLOBULIN, SERUM: Beta-2 Microglobulin: 4.1 mg/L — ABNORMAL HIGH (ref 0.6–2.4)

## 2023-03-03 NOTE — Progress Notes (Signed)
*  PRELIMINARY RESULTS* Echocardiogram 2D Echocardiogram has been performed.  Stacey Drain 03/03/2023, 10:19 AM

## 2023-03-03 NOTE — Patient Instructions (Signed)
 Follow up with Dr. Luberta Robertson. Steristrips will peel off in the next 5-7 days. You can remove them once they are peeling off. It is ok to shower. Pat the area dry.

## 2023-03-07 ENCOUNTER — Encounter (HOSPITAL_COMMUNITY): Payer: Self-pay | Admitting: Specialist

## 2023-03-07 LAB — PROTEIN ELECTROPHORESIS, SERUM
A/G Ratio: 1.3 (ref 0.7–1.7)
Albumin ELP: 3.1 g/dL (ref 2.9–4.4)
Alpha-1-Globulin: 0.3 g/dL (ref 0.0–0.4)
Alpha-2-Globulin: 0.7 g/dL (ref 0.4–1.0)
Beta Globulin: 1 g/dL (ref 0.7–1.3)
Gamma Globulin: 0.4 g/dL (ref 0.4–1.8)
Globulin, Total: 2.3 g/dL (ref 2.2–3.9)
M-Spike, %: 0.1 g/dL — ABNORMAL HIGH
Total Protein ELP: 5.4 g/dL — ABNORMAL LOW (ref 6.0–8.5)

## 2023-03-07 NOTE — Progress Notes (Signed)
 [facility]  No major issues. Pathology back with lymphoma.  BP (!) 146/73   Pulse 66   Temp 98.3 F (36.8 C) (Oral)   Resp 12   Ht 5' 5 (1.651 m)   Wt 123 lb (55.8 kg)   SpO2 95%   BMI 20.47 kg/m  Sutures removed, no erythema or drainage, steri strips placed   Patient s/p incisional biopsy of mass on RLE for diagnosis. Lymphoma found.   Follow up with Dr. Katagradda. Steristrips will peel off in the next 5-7 days. You can remove them once they are peeling off. It is ok to shower. Pat the area dry.   Manuelita Pander, MD [facility] [address] [place] [address], [place] [address] [phone] (office)

## 2023-03-10 ENCOUNTER — Encounter (HOSPITAL_COMMUNITY)
Admission: RE | Admit: 2023-03-10 | Discharge: 2023-03-10 | Disposition: A | Discharge status: Home / Self Care | Payer: Medicare Other | Source: Ambulatory Visit | Attending: Hematology | Admitting: Hematology

## 2023-03-10 ENCOUNTER — Other Ambulatory Visit: Payer: Self-pay

## 2023-03-10 DIAGNOSIS — C8335 Diffuse large B-cell lymphoma, lymph nodes of inguinal region and lower limb: Secondary | ICD-10-CM | POA: Diagnosis not present

## 2023-03-10 DIAGNOSIS — C8595 Non-Hodgkin lymphoma, unspecified, lymph nodes of inguinal region and lower limb: Secondary | ICD-10-CM | POA: Diagnosis not present

## 2023-03-10 LAB — GLUCOSE, CAPILLARY: Glucose-Capillary: 137 mg/dL — ABNORMAL HIGH (ref 70–99)

## 2023-03-10 MED ORDER — LENALIDOMIDE 10 MG PO CAPS: 10.0000 mg | ORAL_CAPSULE | Freq: Every day | ORAL | 0 refills | Status: DC

## 2023-03-10 MED ORDER — FLUDEOXYGLUCOSE F - 18 (FDG) INJECTION
6.4000 | Freq: Once | INTRAVENOUS | Status: AC
  Administered 2023-03-10: 6.422 via INTRAVENOUS

## 2023-03-15 ENCOUNTER — Other Ambulatory Visit: Payer: Self-pay

## 2023-03-15 NOTE — Progress Notes (Signed)
[facility] [address] [place] [address]    Clinic Day:  03/16/2023  Referring physician: Benetta Spar*  Patient Care Team: Benetta Spar, MD as PCP - General (Internal Medicine) Wyline Mood Dorothe Pea, MD as PCP - Cardiology (Cardiology) Doreatha Massed, MD as Medical Oncologist (Medical Oncology)   ASSESSMENT & PLAN:   Assessment: 1.  Biclonal IgG lambda and IgM kappa multiple myeloma, high risk: - BMBX (11/02/2021): IgG lambda plasma cells 70% - Myeloma FISH panel: Monosomy 13 (standard GIST), duplication of 1 q. (high risk), gain of 11 q. (standard risk). - Cytogenetics: Hyperdiploid E weight gain (trisomy/trisomy) of odd-numbered chromosomes and monosomy 13, standard risk.  Gain of 1 q. associated with progressive course. - PET scan (11/05/2021): No evidence of multiple myeloma.  No hypermetabolic adenopathy. - Daratumumab, Revlimid and dexamethasone cycle 1 started on 11/30/2021   2.  JAK2 positive essential thrombocytosis: - BMBX (11/02/2021): No evidence of fibrosis.  No morphological evidence of JAK2 positive ET.   3.  History of marginal zone lymphoma of the spleen: - Diagnosed in 1999, status post CHOP x7 cycles followed by rituximab 1 cycle. - Status post splenectomy.  No evidence of lymphoma on current PET scan.   4.  Social/family history: - She lives with her sister.  Son lives 30 minutes away.  She is independent of ADLs and some IADLs.  She drives.  She worked at [employer] and denies any exposure to chemicals or pesticides.  No family history of malignancies.  5.  Bone protection (DEXA scan 02/17/2022 T-score -0.9): - She saw dentist who recommended dental extractions.  She does not want zoledronic acid or denosumab.  6.  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 - Initial needle biopsy on  01/19/2023 showed malignant hematopoietic neoplasm. - Excision biopsy on 02/18/2023: Diffuse large B-cell lymphoma.    Plan: 1.  IgG lambda multiple myeloma: - She is tolerating Revlimid and Darzalex very well. - Reviewed myeloma labs from 03/02/2023: M spike is 0.1 g.  FLC ratio is 2.72.  However kappa light chains are 51 and lambda light chains at 19.  Immunofixation shows IgM lambda. - I recommend that she stop Revlimid.  Will hold Darzalex until she finishes treatment for large B-cell lymphoma.  She will also stop her dexamethasone weekly.   2.  Anemia from CKD and iron deficiency: - Last Feraheme on 09/24/2022.  Will check ferritin and iron panel to see if she needs any parenteral iron.   3.  ID prophylaxis: - Continue acyclovir twice daily.   4.  Pulmonary embolism (Dx 07/03/2022):: - Continue Eliquis twice daily indefinitely.  No bleeding issues.   5.  Hypokalemia: - Continue potassium 40 mill equivalents daily.  Potassium today is 3.7.  6.  Diffuse large B-cell lymphoma of leg type: - We reviewed PET scan from 03/10/2023 images which showed subcutaneous/intramuscular mass along the medial aspect of the right tibia consistent with lymphoma.  No other evidence of lymphoma in the rest of the body. - We reviewed 2D echo from 03/03/2023, EF 50-55%. - We discussed treatment of lymphoma with R mini CHOP regimen.  We discussed treatment and side effects in detail. - Will likely start her during the week of 03/28/2023. - Will also start her on allopurinol 300 mg daily.  Will dose reduce prednisone to 60  mg daily on days 1-5 with each cycle.  We will reevaluate response after 3 cycles.  If she gets a complete response, we may stop it 4 cycles with or without radiation.    Orders Placed This Encounter  Procedures   Iron and TIBC ([facility] ONLY)   Ferritin   Vitamin B12   Folate   Basic metabolic panel   Magnesium   Uric acid    Standing Status:   Future    Expected Date:    04/18/2023    Expiration Date:   04/17/2024   Magnesium    Standing Status:   Future    Expected Date:   04/18/2023    Expiration Date:   04/17/2024   CBC with Differential    Standing Status:   Future    Expected Date:   04/18/2023    Expiration Date:   04/17/2024   Comprehensive metabolic panel    Standing Status:   Future    Expected Date:   04/18/2023    Expiration Date:   04/17/2024   Uric acid    Standing Status:   Future    Expected Date:   05/09/2023    Expiration Date:   05/08/2024   Magnesium    Standing Status:   Future    Expected Date:   05/09/2023    Expiration Date:   05/08/2024   CBC with Differential    Standing Status:   Future    Expected Date:   05/09/2023    Expiration Date:   05/08/2024   Comprehensive metabolic panel    Standing Status:   Future    Expected Date:   05/09/2023    Expiration Date:   05/08/2024   Magnesium    Standing Status:   Future    Expected Date:   05/30/2023    Expiration Date:   05/29/2024   CBC with Differential    Standing Status:   Future    Expected Date:   05/30/2023    Expiration Date:   05/29/2024   Comprehensive metabolic panel    Standing Status:   Future    Expected Date:   05/30/2023    Expiration Date:   05/29/2024     Mikeal Hawthorne R Teague,acting as a scribe for Doreatha Massed, MD.,have documented all relevant documentation on the behalf of Doreatha Massed, MD,as directed by  Doreatha Massed, MD while in the presence of Doreatha Massed, MD.  I, Doreatha Massed MD, have reviewed the above documentation for accuracy and completeness, and I agree with the above.    Doreatha Massed, MD   1/22/20253:52 PM  CHIEF COMPLAINT:   Diagnosis: multiple myeloma    Cancer Staging  Multiple myeloma (HCC) Staging form: Plasma Cell Myeloma and Plasma Cell Disorders, AJCC 8th Edition - Clinical stage from 11/16/2021: Albumin (g/dL): 3.1, ISS: Stage II, High-risk cytogenetics: Absent, LDH: Normal - Unsigned  Primary  cutaneous diffuse large cell B-cell lymphoma of lower extremity (HCC) Staging form: Primary Cutaneous B-Cell/T-Cell Lymphoma (Non-MF/SS Lymphoma), AJCC 8th Edition - Clinical stage from 03/01/2023: cT1a, cN0, cM0 - Unsigned    Prior Therapy: none  Current Therapy:  Daratumumab, Revlimid and dexamethasone    HISTORY OF PRESENT ILLNESS:   Oncology History  NHL (non-Hodgkin's lymphoma) (HCC)  07/19/1995 Pathology Results   Spleen biopsy with resection- low-grade B-cell lymphoma, splenic marginal zone type   02/20/1998 Imaging   CT CAP-marked and extensive cervical adenopathy   02/25/1998 Pathology Results   Right cervical lymph node  demonstrating large B-cell lymphoma   03/05/1998 Bone Marrow Biopsy   No evidence of bone marrow involvement   03/11/1998 - 07/18/1998 Chemotherapy   CHOP x 7 cycles   05/09/1998 Imaging   CT CAP and neck- slight interval decrease in size of para-aortic adenopathy with interval decrease in right inguinal and bi-iliac adenopathy.  Interval decrease in size and number of enlarged cervical nodes   07/10/1998 Imaging   Ct CAP and neck- unremarkable CT of chest. No enlarged retroperitoneal adenopathy withthe previously noted retroperitoneal nodes currently smaller to stable in size.  Cervical adenopathy is stable to slightly smaller in size.   08/18/1998 - 09/08/1998 Chemotherapy   Rituxan Day 1, 8, 15, 22 x 1 cycle   01/08/1999 Remission   CT CAP and neck demonstrates no adenopathy or disease   01/08/1999 Imaging   CT CAP and neck- Stable CT of chest. Stable CT abd/pelvis without adenopathy.  Negative CT of neck   Multiple myeloma (HCC)  11/16/2021 Initial Diagnosis   Multiple myeloma (HCC)   11/30/2021 -  Chemotherapy   Patient is on Treatment Plan : MYELOMA  Daratumumab SQ + Lenalidomide + Dexamethasone (DaraRd) q28d     03/28/2023 -  Chemotherapy   Patient is on Treatment Plan : NON-HODGKINS LYMPHOMA R-CHOP q21d x 4 cycles        INTERVAL HISTORY:    Leslie Williamson is a 89 y.o. female seen for follow-up of multiple myeloma.  She was last seen by me on 03/02/23.  Since her last visit, she underwent restaging PET on 03/10/23 that found: intense adiotracer activity associated with subcutaneous/intramuscular mass along the medial aspect of the RIGHT tibia consistent with lymphoma; asymmetric hypermetabolic activity associated with the LEFT subscapularis muscle; metabolic activity is less than primary lesion within the RIGHT calf; no evidence of lymphoma adenopathy; and fluid collection along the RIGHT pericolic gutter is simple fluid attenuation.  Today, she states that she is doing well overall. Her appetite level is at 100%. Her energy level is at 75%.   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   Vertigo, labyrinthine     Surgical History: Past Surgical History:  Procedure Laterality Date   CATARACT EXTRACTION W/PHACO Left 11/03/2015   Procedure: CATARACT EXTRACTION PHACO AND INTRAOCULAR LENS PLACEMENT (IOC);  Surgeon: Gemma Payor, MD;  Location: [facility];  Service: Ophthalmology;  Laterality: Left;  CDE: 7.74   CATARACT EXTRACTION W/PHACO Right 11/20/2015   Procedure: CATARACT EXTRACTION PHACO AND INTRAOCULAR LENS PLACEMENT ; CDE:  8.30;  Surgeon: Gemma Payor, MD;  Location: [facility];  Service: Ophthalmology;   Laterality: Right;   IR IMAGING GUIDED PORT INSERTION  11/04/2021   MASS EXCISION Right 02/18/2023   Procedure: Incisional Biopsy Right Lower Extremity;  Surgeon: Algis Greenhouse  C, MD;  Location: [facility];  Service: General;  Laterality: Right;   PORT-A-CATH REMOVAL      Social History: Social History   Socioeconomic History   Marital status: Widowed    Spouse name: Not on file   Number of children: Not on file   Years of education: Not on file   Highest education level: Not on file  Occupational History   Not on file  Tobacco Use   Smoking status: Former   Smokeless tobacco: Never  Vaping Use   Vaping status: Never Used  Substance and Sexual Activity   Alcohol use: No   Drug use: No   Sexual activity: Not Currently  Other Topics Concern   Not on file  Social History Narrative   Not on file   Social Drivers of Health   Financial Resource Strain: Low Risk  (12/31/2019)   Overall Financial Resource Strain (CARDIA)    Difficulty of Paying Living Expenses: Not hard at all  Food Insecurity: No Food Insecurity (07/03/2022)   Hunger Vital Sign    Worried About Running Out of Food in the Last Year: Never true    Ran Out of Food in the Last Year: Never true  Transportation Needs: No Transportation Needs (07/03/2022)   PRAPARE - Administrator, Civil Service (Medical): No    Lack of Transportation (Non-Medical): No  Physical Activity: Inactive (12/31/2019)   Exercise Vital Sign    Days of Exercise per Week: 0 days    Minutes of Exercise per Session: 0 min  Stress: No Stress Concern Present (12/31/2019)   [employer] of Occupational Health - Occupational Stress Questionnaire    Feeling of Stress : Not at all  Social Connections: Moderately Isolated (12/31/2019)   Social Connection and Isolation Panel [NHANES]    Frequency of Communication with Friends and Family: More than three times a week    Frequency of Social Gatherings with Friends and Family: More than  three times a week    Attends Religious Services: More than 4 times per year    Active Member of [employer] or Organizations: No    Attends Banker Meetings: Never    Marital Status: Widowed  Intimate Partner Violence: Not At Risk (07/03/2022)   Humiliation, Afraid, Rape, and Kick questionnaire    Fear of Current or Ex-Partner: No    Emotionally Abused: No    Physically Abused: No    Sexually Abused: No    Family History: Family History  Problem Relation Age of Onset   Diabetes Mother     Current Medications:  Current Outpatient Medications:    acetaminophen (TYLENOL) 500 MG tablet, Take 1,000 mg by mouth every 6 (six) hours as needed for moderate pain., Disp: , Rfl:    acyclovir (ZOVIRAX) 400 MG tablet, Take 1 tablet (400 mg total) by mouth 2 (two) times daily., Disp: 60 tablet, Rfl: 6   apixaban (ELIQUIS) 5 MG TABS tablet, Take 1 tablet (5 mg total) by mouth 2 (two) times daily., Disp: 60 tablet, Rfl: 1   atorvastatin (LIPITOR) 40 MG tablet, Take 40 mg by mouth at bedtime., Disp: , Rfl:    dapagliflozin propanediol (FARXIGA) 10 MG TABS tablet, Take 1 tablet (10 mg total) by mouth daily., Disp: 30 tablet, Rfl: 1   dexamethasone (DECADRON) 4 MG tablet, Take 10 mg (2.5 tablets) by mouth weekly every Wednesday morning Per note by Dr. Ellin Saba on 09/15/2022, Disp: 20 tablet, Rfl: 6   furosemide (LASIX)  40 MG tablet, Take 40mg  as needed if you gain more than 3 pounds in 1 day or 5 pounds in 1 week (Patient taking differently: Take 40 mg by mouth daily. Take 40mg  as needed if you gain more than 3 pounds in 1 day or 5 pounds in 1 week), Disp: 30 tablet, Rfl: 1   lenalidomide (REVLIMID) 10 MG capsule, Take 1 capsule (10 mg total) by mouth daily. Take for 21 days on, 7 days off., Disp: 21 capsule, Rfl: 0   metoprolol succinate (TOPROL-XL) 25 MG 24 hr tablet, Take 0.5 tablets (12.5 mg total) by mouth daily., Disp: 30 tablet, Rfl: 0   oxyCODONE (ROXICODONE) 5 MG immediate release tablet,  Take 1 tablet (5 mg total) by mouth every 4 (four) hours as needed for severe pain (pain score 7-10) or breakthrough pain., Disp: 5 tablet, Rfl: 0   potassium chloride SA (KLOR-CON M20) 20 MEQ tablet, Take 1 tablet on the days you take lasix (Patient taking differently: Take 20 mEq by mouth 2 (two) times daily.), Disp: 180 tablet, Rfl: 3   lidocaine-prilocaine (EMLA) cream, Apply 1 Application topically as needed. (Patient not taking: Reported on 03/16/2023), Disp: 30 g, Rfl: 1   ondansetron (ZOFRAN) 4 MG tablet, Take 1 tablet (4 mg total) by mouth every 8 (eight) hours as needed. (Patient not taking: Reported on 03/16/2023), Disp: 30 tablet, Rfl: 1   Allergies: Allergies  Allergen Reactions   Penicillins Rash    REVIEW OF SYSTEMS:   Review of Systems  Constitutional:  Negative for chills, fatigue and fever.  HENT:   Negative for lump/mass, mouth sores, nosebleeds, sore throat and trouble swallowing.   Eyes:  Negative for eye problems.  Respiratory:  Negative for cough and shortness of breath.   Cardiovascular:  Negative for chest pain, leg swelling and palpitations.  Gastrointestinal:  Positive for diarrhea. Negative for abdominal pain, constipation, nausea and vomiting.  Genitourinary:  Negative for bladder incontinence, difficulty urinating, dysuria, frequency, hematuria and nocturia.   Musculoskeletal:  Negative for arthralgias, back pain, flank pain, myalgias and neck pain.  Skin:  Negative for itching and rash.  Neurological:  Positive for dizziness. Negative for headaches and numbness.  Hematological:  Does not bruise/bleed easily.  Psychiatric/Behavioral:  Positive for sleep disturbance. Negative for depression and suicidal ideas. The patient is not nervous/anxious.   All other systems reviewed and are negative.    VITALS:   Blood pressure (!) 120/50, pulse 63, temperature (!) 97.5 F (36.4 C), temperature source Tympanic, resp. rate 17, height 5' (1.524 m), weight 121 lb (54.9  kg), SpO2 98%.  Wt Readings from Last 3 Encounters:  03/16/23 121 lb (54.9 kg)  03/03/23 123 lb (55.8 kg)  03/02/23 123 lb 4.8 oz (55.9 kg)    Body mass index is 23.63 kg/m.  Performance status (ECOG): 1 - Symptomatic but completely ambulatory  PHYSICAL EXAM:   Physical Exam Vitals and nursing note reviewed. Exam conducted with a chaperone present.  Constitutional:      Appearance: Normal appearance.  Cardiovascular:     Rate and Rhythm: Normal rate and regular rhythm.     Pulses: Normal pulses.     Heart sounds: Normal heart sounds.  Pulmonary:     Effort: Pulmonary effort is normal.     Breath sounds: Normal breath sounds.  Abdominal:     Palpations: Abdomen is soft. There is no hepatomegaly, splenomegaly or mass.     Tenderness: There is no abdominal tenderness.  Musculoskeletal:  Right lower leg: No edema.     Left lower leg: No edema.  Lymphadenopathy:     Cervical: No cervical adenopathy.     Right cervical: No superficial, deep or posterior cervical adenopathy.    Left cervical: No superficial, deep or posterior cervical adenopathy.     Upper Body:     Right upper body: No supraclavicular or axillary adenopathy.     Left upper body: No supraclavicular or axillary adenopathy.  Neurological:     General: No focal deficit present.     Mental Status: She is alert and oriented to person, place, and time.  Psychiatric:        Mood and Affect: Mood normal.        Behavior: Behavior normal.     LABS:      Latest Ref Rng & Units 03/02/2023   12:31 PM 02/24/2023   10:01 AM 02/18/2023    8:48 AM  CBC  WBC 4.0 - 10.5 K/uL 7.9  6.1    Hemoglobin 12.0 - 15.0 g/dL 9.6  9.4  64.4   Hematocrit 36.0 - 46.0 % 31.0  30.7  31.0   Platelets 150 - 400 K/uL 500  497        Latest Ref Rng & Units 03/16/2023    2:59 PM 02/18/2023    8:48 AM 01/03/2023    2:38 PM  CMP  Glucose 70 - 99 mg/dL 99  77  79   BUN 8 - 23 mg/dL 25  20  14    Creatinine 0.44 - 1.00 mg/dL 0.34  7.42   5.95   Sodium 135 - 145 mmol/L 137  138  139   Potassium 3.5 - 5.1 mmol/L 3.7  3.5  3.1   Chloride 98 - 111 mmol/L 107  111  108   CO2 22 - 32 mmol/L 21   24   Calcium 8.9 - 10.3 mg/dL 8.7   8.3      No results found for: "CEA1", "CEA" / No results found for: "CEA1", "CEA" No results found for: "PSA1" No results found for: "CAN199" No results found for: "CAN125"  Lab Results  Component Value Date   TOTALPROTELP 5.4 (L) 03/02/2023   ALBUMINELP 3.1 03/02/2023   A1GS 0.3 03/02/2023   A2GS 0.7 03/02/2023   BETS 1.0 03/02/2023   GAMS 0.4 03/02/2023   MSPIKE 0.1 (H) 03/02/2023   SPEI Comment 03/02/2023   Lab Results  Component Value Date   TIBC 375 09/15/2022   TIBC 249 (L) 03/10/2022   TIBC 291 09/30/2021   FERRITIN 7 (L) 09/15/2022   FERRITIN 93 03/10/2022   FERRITIN 45 09/30/2021   IRONPCTSAT 5 (L) 09/15/2022   IRONPCTSAT 8 (L) 03/10/2022   IRONPCTSAT 19 09/30/2021   Lab Results  Component Value Date   LDH 122 03/02/2023   LDH 154 09/30/2021   LDH 148 08/05/2021     STUDIES:   NM PET Image Restage (PS) Whole Body Result Date: 03/15/2023 CLINICAL DATA:  Initial treatment strategy for diffuse B-cell lymphoma. Lower extremity lymphoma EXAM: NUCLEAR MEDICINE PET WHOLE BODY TECHNIQUE: 6.4 mCi F-18 FDG was injected intravenously. Full-ring PET imaging was performed from the head to foot after the radiotracer. CT data was obtained and used for attenuation correction and anatomic localization. Fasting blood glucose: 137 mg/dl COMPARISON:  None Available. FINDINGS: Mediastinal blood pool activity: SUV max 2.4 HEAD/NECK: Metabolic activity associated with the paraspinal musculature the skull base is favored benign physiologic. No suspicious FDG activity  in the head or neck. Incidental CT findings: none CHEST: No hypermetabolic mediastinal lymph nodes. Suspicious pulmonary nodules. Port in the anterior chest wall with tip in distal SVC. Metabolic activity associated with the  subscapularis muscle along the body the LEFT scapula with SUV max equal 4.2 on image 75. There is no mass lesion identified within the musculature. Activity is less than primary lesion within the RIGHT calf. Muscle activity in the forearms and hands is typical benign physiologic muscle activity. Incidental CT findings: none ABDOMEN/PELVIS: No abnormal hypermetabolic activity within the liver, pancreas, adrenal glands, or spleen. No hypermetabolic lymph nodes in the abdomen or pelvis. Incidental CT findings: Fluid collection along the RIGHT pericolic gutter measuring 4.5 by 2.8 cm (image 134) is simple fluid attenuation. No radiotracer activity. SKELETON: No focal hypermetabolic activity to suggest skeletal metastasis. Incidental CT findings: none EXTREMITIES: Intense radiotracer activity along the medial aspect of the RIGHT tibia with SUV max equal 7.0. Activity corresponds to subcutaneous/intramuscular mass measuring 4.2 by 2.6 cm on image 298. No additional lower extremity abnormal metabolic activity. Incidental CT findings: none IMPRESSION: 1. Intense radiotracer activity associated with subcutaneous/intramuscular mass along the medial aspect of the RIGHT tibia consistent with lymphoma. 2. Asymmetric hypermetabolic activity associated with the LEFT subscapularis muscle. Metabolic activity is less than primary lesion within the RIGHT calf. Favor benign physiologic activity. Consider MRI with contrast for confirmation. 3. No evidence of lymphoma adenopathy.  Normal spleen. 4. Fluid collection along the RIGHT pericolic gutter is simple fluid attenuation. No radiotracer activity. Favor postoperative seroma. Electronically Signed   By: Genevive Bi M.D.   On: 03/15/2023 10:03   ECHOCARDIOGRAM COMPLETE Result Date: 03/03/2023    ECHOCARDIOGRAM REPORT   Patient Name:   LEYLANNI BENEFIELD Date of Exam: 03/03/2023 Medical Rec #:  [id number]         Height:       65.0 in Accession #:    [id number]        Weight:        123.3 lb Date of Birth:  [birth year]         BSA:          1.611 m Patient Age:    89 years          BP:           134/76 mmHg Patient Gender: F                 HR:           73 bpm. [facility] Procedure: 2D Echo, Cardiac Doppler and Color Doppler Indications:    Chemo Z09  History:        Patient has prior history of Echocardiogram examinations, most                 recent 07/03/2022. CHF; Risk Factors:Hypertension and                 Dyslipidemia. Hx of COVID-19, Malignant neoplasm of lymphoid,                 hematopoietic and related tissue, unspecified (HCC).  Sonographer:    Celesta Gentile RCS Referring Phys: [phone] Doreatha Massed IMPRESSIONS  1. Left ventricular ejection fraction, by estimation, is 50 to 55%. The left ventricle has low normal function. The left ventricle has no regional wall motion abnormalities. There is mild left ventricular hypertrophy. Left ventricular diastolic parameters are consistent with Grade I diastolic dysfunction (impaired relaxation).  Elevated left atrial pressure.  2. Right ventricular systolic function is normal. The right ventricular size is normal. There is normal pulmonary artery systolic pressure.  3. Left atrial size was moderately dilated.  4. The mitral valve is abnormal. Trivial mitral valve regurgitation. No evidence of mitral stenosis.  5. The tricuspid valve is abnormal.  6. The aortic valve is tricuspid. There is mild calcification of the aortic valve. There is mild thickening of the aortic valve. Aortic valve regurgitation is not visualized. No aortic stenosis is present.  7. The inferior vena cava is normal in size with greater than 50% respiratory variability, suggesting right atrial pressure of 3 mmHg. FINDINGS  Left Ventricle: Left ventricular ejection fraction, by estimation, is 50 to 55%. The left ventricle has low normal function. The left ventricle has no regional wall motion abnormalities. The left ventricular internal cavity size was  normal in size. There is mild left ventricular hypertrophy. Left ventricular diastolic parameters are consistent with Grade I diastolic dysfunction (impaired relaxation). Elevated left atrial pressure. Right Ventricle: The right ventricular size is normal. Right vetricular wall thickness was not well visualized. Right ventricular systolic function is normal. There is normal pulmonary artery systolic pressure. The tricuspid regurgitant velocity is 2.75 m/s, and with an assumed right atrial pressure of 3 mmHg, the estimated right ventricular systolic pressure is 33.2 mmHg. Left Atrium: Left atrial size was moderately dilated. Right Atrium: Right atrial size was normal in size. Pericardium: There is no evidence of pericardial effusion. Mitral Valve: The mitral valve is abnormal. There is mild thickening of the mitral valve leaflet(s). There is mild calcification of the mitral valve leaflet(s). Mild mitral annular calcification. Trivial mitral valve regurgitation. No evidence of mitral valve stenosis. Tricuspid Valve: The tricuspid valve is abnormal. Tricuspid valve regurgitation is mild . No evidence of tricuspid stenosis. Aortic Valve: The aortic valve is tricuspid. There is mild calcification of the aortic valve. There is mild thickening of the aortic valve. There is mild aortic valve annular calcification. Aortic valve regurgitation is not visualized. No aortic stenosis  is present. Aortic valve mean gradient measures 3.1 mmHg. Aortic valve peak gradient measures 8.0 mmHg. Aortic valve area, by VTI measures 2.43 cm. Pulmonic Valve: The pulmonic valve was not well visualized. Pulmonic valve regurgitation is not visualized. No evidence of pulmonic stenosis. Aorta: The aortic root is normal in size and structure. Venous: The inferior vena cava is normal in size with greater than 50% respiratory variability, suggesting right atrial pressure of 3 mmHg. IAS/Shunts: No atrial level shunt detected by color flow Doppler.   LEFT VENTRICLE PLAX 2D LVIDd:         4.90 cm     Diastology LVIDs:         2.90 cm     LV e' medial:    4.03 cm/s LV PW:         1.10 cm     LV E/e' medial:  17.4 LV IVS:        1.10 cm     LV e' lateral:   6.42 cm/s LVOT diam:     1.90 cm     LV E/e' lateral: 10.9 LV SV:         71 LV SV Index:   44 LVOT Area:     2.84 cm  LV Volumes (MOD) LV vol d, MOD A2C: 90.6 ml LV vol d, MOD A4C: 77.2 ml LV vol s, MOD A2C: 41.8 ml LV vol s, MOD A4C: 32.1  ml LV SV MOD A2C:     48.8 ml LV SV MOD A4C:     77.2 ml LV SV MOD BP:      46.7 ml RIGHT VENTRICLE RV S prime:     16.90 cm/s TAPSE (M-mode): 2.2 cm LEFT ATRIUM             Index        RIGHT ATRIUM           Index LA diam:        3.60 cm 2.24 cm/m   RA Area:     13.40 cm LA Vol (A2C):   79.3 ml 49.24 ml/m  RA Volume:   35.70 ml  22.17 ml/m LA Vol (A4C):   56.8 ml 35.27 ml/m LA Biplane Vol: 71.8 ml 44.58 ml/m  AORTIC VALVE AV Area (Vmax):    2.01 cm AV Area (Vmean):   2.40 cm AV Area (VTI):     2.43 cm AV Vmax:           141.17 cm/s AV Vmean:          80.501 cm/s AV VTI:            0.292 m AV Peak Grad:      8.0 mmHg AV Mean Grad:      3.1 mmHg LVOT Vmax:         100.00 cm/s LVOT Vmean:        68.200 cm/s LVOT VTI:          0.251 m LVOT/AV VTI ratio: 0.86  AORTA Ao Root diam: 3.20 cm MITRAL VALVE                TRICUSPID VALVE MV Area (PHT): 3.03 cm     TR Peak grad:   30.2 mmHg MV Decel Time: 250 msec     TR Vmax:        275.00 cm/s MV E velocity: 70.00 cm/s MV A velocity: 130.00 cm/s  SHUNTS MV E/A ratio:  0.54         Systemic VTI:  0.25 m                             Systemic Diam: 1.90 cm Dina Rich MD Electronically signed by Dina Rich MD Signature Date/Time: 03/03/2023/10:30:55 AM    Final

## 2023-03-16 ENCOUNTER — Inpatient Hospital Stay: Payer: Medicare Other | Admitting: Hematology

## 2023-03-16 ENCOUNTER — Inpatient Hospital Stay: Payer: Medicare Other

## 2023-03-16 VITALS — BP 120/50 | HR 63 | Temp 97.5°F | Resp 17 | Ht 60.0 in | Wt 121.0 lb

## 2023-03-16 DIAGNOSIS — D509 Iron deficiency anemia, unspecified: Secondary | ICD-10-CM

## 2023-03-16 DIAGNOSIS — E7800 Pure hypercholesterolemia, unspecified: Secondary | ICD-10-CM | POA: Diagnosis not present

## 2023-03-16 DIAGNOSIS — D631 Anemia in chronic kidney disease: Secondary | ICD-10-CM | POA: Diagnosis not present

## 2023-03-16 DIAGNOSIS — Z5112 Encounter for antineoplastic immunotherapy: Secondary | ICD-10-CM | POA: Diagnosis not present

## 2023-03-16 DIAGNOSIS — Z7984 Long term (current) use of oral hypoglycemic drugs: Secondary | ICD-10-CM | POA: Diagnosis not present

## 2023-03-16 DIAGNOSIS — Z8572 Personal history of non-Hodgkin lymphomas: Secondary | ICD-10-CM | POA: Diagnosis not present

## 2023-03-16 DIAGNOSIS — Z7901 Long term (current) use of anticoagulants: Secondary | ICD-10-CM | POA: Diagnosis not present

## 2023-03-16 DIAGNOSIS — D75839 Thrombocytosis, unspecified: Secondary | ICD-10-CM | POA: Diagnosis not present

## 2023-03-16 DIAGNOSIS — Z7961 Long term (current) use of immunomodulator: Secondary | ICD-10-CM | POA: Diagnosis not present

## 2023-03-16 DIAGNOSIS — Z79624 Long term (current) use of inhibitors of nucleotide synthesis: Secondary | ICD-10-CM | POA: Diagnosis not present

## 2023-03-16 DIAGNOSIS — C9000 Multiple myeloma not having achieved remission: Secondary | ICD-10-CM | POA: Diagnosis not present

## 2023-03-16 DIAGNOSIS — N189 Chronic kidney disease, unspecified: Secondary | ICD-10-CM | POA: Diagnosis not present

## 2023-03-16 DIAGNOSIS — E876 Hypokalemia: Secondary | ICD-10-CM | POA: Diagnosis not present

## 2023-03-16 DIAGNOSIS — I739 Peripheral vascular disease, unspecified: Secondary | ICD-10-CM | POA: Diagnosis not present

## 2023-03-16 DIAGNOSIS — Z79899 Other long term (current) drug therapy: Secondary | ICD-10-CM | POA: Diagnosis not present

## 2023-03-16 DIAGNOSIS — I130 Hypertensive heart and chronic kidney disease with heart failure and stage 1 through stage 4 chronic kidney disease, or unspecified chronic kidney disease: Secondary | ICD-10-CM | POA: Diagnosis not present

## 2023-03-16 DIAGNOSIS — Z87891 Personal history of nicotine dependence: Secondary | ICD-10-CM | POA: Diagnosis not present

## 2023-03-16 DIAGNOSIS — I2699 Other pulmonary embolism without acute cor pulmonale: Secondary | ICD-10-CM | POA: Diagnosis not present

## 2023-03-16 DIAGNOSIS — Z9081 Acquired absence of spleen: Secondary | ICD-10-CM | POA: Diagnosis not present

## 2023-03-16 LAB — BASIC METABOLIC PANEL
Anion gap: 9 (ref 5–15)
BUN: 25 mg/dL — ABNORMAL HIGH (ref 8–23)
CO2: 21 mmol/L — ABNORMAL LOW (ref 22–32)
Calcium: 8.7 mg/dL — ABNORMAL LOW (ref 8.9–10.3)
Chloride: 107 mmol/L (ref 98–111)
Creatinine, Ser: 1.45 mg/dL — ABNORMAL HIGH (ref 0.44–1.00)
GFR, Estimated: 34 mL/min — ABNORMAL LOW (ref >60)
Glucose, Bld: 99 mg/dL (ref 70–99)
Potassium: 3.7 mmol/L (ref 3.5–5.1)
Sodium: 137 mmol/L (ref 135–145)

## 2023-03-16 LAB — FOLATE: Folate: 9.5 ng/mL (ref >5.9)

## 2023-03-16 LAB — VITAMIN B12: Vitamin B-12: 448 pg/mL (ref 180–914)

## 2023-03-16 LAB — IRON AND TIBC
Iron: 45 ug/dL (ref 28–170)
Saturation Ratios: 12 % (ref 10.4–31.8)
TIBC: 364 ug/dL (ref 250–450)
UIBC: 319 ug/dL

## 2023-03-16 LAB — FERRITIN: Ferritin: 6 ng/mL — ABNORMAL LOW (ref 11–307)

## 2023-03-16 LAB — MAGNESIUM: Magnesium: 1.7 mg/dL (ref 1.7–2.4)

## 2023-03-16 MED ORDER — HEPARIN SOD (PORK) LOCK FLUSH 100 UNIT/ML IV SOLN
500.0000 [IU] | Freq: Once | INTRAVENOUS | Status: AC
  Administered 2023-03-16: 500 [IU] via INTRAVENOUS

## 2023-03-16 MED ORDER — SODIUM CHLORIDE 0.9% FLUSH
10.0000 mL | INTRAVENOUS | Status: AC
  Administered 2023-03-16: 10 mL

## 2023-03-16 NOTE — Progress Notes (Signed)
START ON PATHWAY REGIMEN - Lymphoma and CLL     A cycle is every 21 days:     Prednisone      Rituximab-xxxx      Cyclophosphamide      Doxorubicin      Vincristine   **Always confirm dose/schedule in your pharmacy ordering system**  Patient Characteristics: Diffuse Large B-Cell Lymphoma or Follicular Lymphoma, Grade 3B, First Line, Stage I and II, No Bulk Disease Type: Not Applicable Disease Type: Diffuse Large B-Cell Lymphoma Disease Type: Not Applicable Line of therapy: First Line Disease Characteristics: No Bulk Intent of Therapy: Curative Intent, Discussed with Patient

## 2023-03-16 NOTE — Patient Instructions (Addendum)
[facility] at [facility] Discharge Instructions   You were seen and examined today by Dr. Ellin Saba.  He reviewed the results of your PET scan. It is a special scan that lights up parts of your body where there is lymphoma. The only area lighting up on this exam is the known area on the right leg.   We did an echocardiogram that is showing your heart has normal function.  The blood tests we did 2 weeks ago are all normal/stable.   We will plan to treat this lymphoma with the treatment you got back in 1999, but it will be half the dose you received in the past. We will hold your treatment for myeloma while we treat the lymphoma. You will stop the Revlimid pill and dexamethasone (steroid pills) you take at home and we will stop the Darzalex injections while you are on the lymphoma treatment.   We will plan to start you in 1-2 weeks.   Return as scheduled.    Thank you for choosing [facility] at [facility] to provide your oncology and hematology care.  To afford each patient quality time with our provider, please arrive at least 15 minutes before your scheduled appointment time.   If you have a lab appointment with the Cancer Center please come in thru the Main Entrance and check in at the main information desk.  You need to re-schedule your appointment should you arrive 10 or more minutes late.  We strive to give you quality time with our providers, and arriving late affects you and other patients whose appointments are after yours.  Also, if you no show three or more times for appointments you may be dismissed from the clinic at the providers discretion.     Again, thank you for choosing [facility].  Our hope is that these requests will decrease the amount of time that you wait before being seen by our physicians.       _____________________________________________________________  Should you have questions after your visit to  [facility], please contact our office at [phone] and follow the prompts.  Our office hours are 8:00 a.m. and 4:30 p.m. Monday - Friday.  Please note that voicemails left after 4:00 p.m. may not be returned until the following business day.  We are closed weekends and major holidays.  You do have access to a nurse 24-7, just call the main number to the clinic [phone] and do not press any options, hold on the line and a nurse will answer the phone.    For prescription refill requests, have your pharmacy contact our office and allow 72 hours.    Due to Covid, you will need to wear a mask upon entering the hospital. If you do not have a mask, a mask will be given to you at the Main Entrance upon arrival. For doctor visits, patients may have 1 support person age 40 or older with them. For treatment visits, patients can not have anyone with them due to social distancing guidelines and our immunocompromised population.

## 2023-03-16 NOTE — Progress Notes (Signed)
Leslie Williamson presented for Portacath access and flush.  Portacath located right chest wall accessed with  H 20 needle.  Good blood return present. Portacath flushed with 20ml NS and 500U/25ml Heparin and needle removed intact.  Procedure tolerated well and without incident. Discharged from clinic by wheel chair in stable condition. Alert and oriented x 3. F/U with [facility] as scheduled.

## 2023-03-16 NOTE — Progress Notes (Signed)
Patient is taking Revlimid as prescribed.  She has not missed any doses and reports no side effects at this time.   

## 2023-03-17 NOTE — Patient Instructions (Signed)
[facility] Chemotherapy Teaching   You are diagnosed with  diffuse large B-cell lymphoma in you leg.   You will be treated in the clinic every 3 weeks (for 6 cycles) with a combination of chemotherapy drugs, and an immunotherapy drug.  The intent of treatment is cure.  You will see the doctor regularly throughout treatment.  We will obtain blood work from you prior to every treatment and monitor your results to make sure it is safe to give your treatment. The doctor monitors your response to treatment by the way you are feeling, your blood work, and by obtaining scans periodically.  There will be wait times while you are here for treatment.  It will take about 30 minutes to 1 hour for your lab work to result.  Then there will be wait times while pharmacy mixes your medications.    R-CHOP is the acronym for the combination of chemotherapy drugs and other drugs you will receive for your treatment.  The drugs this regimen includes are:  Rituxan (rituximab); Cytoxan (cyclophosphamide); Oncovin (vincristine); Adriamycin (doxorubicin HCl); and prednisone (not a chemo drug).  Medications you will receive in the clinic prior to your chemotherapy medications to help with nausea:  Aloxi:  ALOXI is used in adults to help prevent nausea and vomiting that happens with certain chemotherapy drugs.  Aloxi is a long acting medication, and will remain in your system for about two days.   Emend:  This is an anti-nausea medication that is used with Aloxi to help prevent nausea and vomiting caused by chemotherapy.  Dexamethasone:  This is a steroid given prior to chemotherapy to help prevent allergic reactions; it may also help prevent and control nausea and diarrhea.   Tylenol:  Given to prevent infusion reactions to rituximab.  Benadryl:  Antihistamine given to prevent allergic/infusion reactions to rituximab.    Doxorubicin (Generic Name) Other Names: Adriamycin, hydroxyl daunorubicin  About  This Drug  Doxorubicin is a drug used to treat cancer. This drug is given in the vein (IV).  This is an IV push that will take about 5-10 minutes.  Possible Side Effects (More Common)   Bone marrow depression. This is a decrease in the number of white blood cells, red blood cells, and platelets. This may raise your risk of infection, make you tired and weak (fatigue), and raise your risk of bleeding.   Hair loss: Hair loss is often complete scalp hair loss and can involve loss of eyebrows, eyelashes, and pubic hair. You may notice this a few days or weeks after treatment has started. Most often hair loss is temporary; your hair should grow back when treatment is done.   Nausea and throwing up (vomiting). These symptoms may happen within a few hours after your treatment and may last up to 24 hours. Medicines are available to stop or lessen these side effects.   Soreness of the mouth and throat. You may have red areas, white patches, or sores that hurt.   Change in the color of your urine to pink or red. This color change will go away in one to two days.   Effects on the heart: This drug can weaken the heart and lower heart function. Your heart function will be checked as needed. You may have trouble catching your breath, mainly during activities.  You may also have trouble breathing while lying down, and have swelling in your ankles.   Sensitivity to light (photosensitivity). Photosensitivity means that you may become more  sensitive to the effects of the sun, sun lamps, and tanning beds. Your eyes may water more, mostly in bright light.   Metallic taste in the mouth: This may change the taste of food and drinks   Decreased appetite (decreased hunger)   Darkening of the skin or nails   Weakness that interferes with your daily activities   Possible Side Effects (Less Common)   Skin and tissue irritation may involve redness, pain, warmth, or swelling at the IV site. This happens if the  drug leaks out of the vein and into nearby tissue.   Changes in your liver function. Your doctor will check your liver function as needed.   This drug may cause an increased risk of developing a second cancer  Allergic Reaction  Serious allergic reactions, including anaphylaxis are rare. While you are getting this drug in your vein (IV), tell your nurse right away if you have any of these symptoms of an allergic reaction:   Trouble catching your breath   Feeling like your tongue or throat are swelling   Feeling your heart beat quickly or in a not normal way (palpitations)   Feeling dizzy or lightheaded   Flushing, itching, rash, and/or hives   Treating Side Effects   Drink 6-8 cups of fluids every day unless your doctor has told you to limit your fluid intake due to some other health problem. A cup is 8 ounces of fluid. If you vomit or have diarrhea, you should drink more fluids so that you do not become dehydrated (lack water in the body due to losing too much fluid).   Ask your doctor or nurse about medicine that is available to help stop or lessen nausea, throwing up, and/or loose bowel movements   Wear dark sunglasses and use sunscreen with SPF 30 or higher when you are outdoors even for a short time. Cover up when you are out in the sun. Wear wide-brimmed hats, long-sleeved shirts, and pants. Keep your neck, chest, and back covered.   Mouth care is very important. Your mouth care should consist of routine, gentle cleaning of your teeth or dentures and rinsing your mouth with a mixture of 1/2 teaspoon of salt in 8 ounces of water or  teaspoon of baking soda in 8 ounces of water. This should be done at least after each meal and at bedtime.   If you have mouth sores, avoid mouthwash that has alcohol. Avoid alcohol and smoking because they can bother your mouth and throat.   Talk with your nurse about getting a wig before you lose your hair. Also, call the [employer]  at 800-ACS-2345 to find out information about the "Look Good, Feel Better" program close to where you live. It is a free program where women getting chemotherapy can learn about wigs, turbans and scarves as well as makeup techniques and skin and nail care.   While you are getting this drug, please tell your nurse right away if you have any pain, redness, or swelling at the site of the IV infusion.   Food and Drug Interactions  There are no known interactions of doxorubicin with food. This drug may interact with other medicines. Tell your doctor and pharmacist about all the medicines and dietary supplements (vitamins, minerals, herbs and others) that you are taking at this time. The safety and use of dietary supplements and alternative diets are often not known. Using these might affect your cancer or interfere with your treatment. Until more is  known, you should not use dietary supplements or alternative diets without your cancer doctor's help.   When to Call the Doctor  Call your doctor or nurse right away if you have any of these symptoms:   Fever of 100.4 F (38 C) or above   Chills   Easy bruising or bleeding   Wheezing or trouble breathing   Rash or itching   Feeling dizzy or lightheaded   Feeling that your heart is beating in a fast or not normal way (palpitations)   Loose bowel movements (diarrhea) more than 4 times a day or diarrhea with weakness or feeling lightheaded   Nausea that stops you from eating or drinking   Throwing up/vomiting   Signs of liver problems: dark urine, pale bowel movements, bad stomach pain, feeling very tired and weak, unusual itching, or yellowing of the eyes or skin,   During the IV infusion, if you have pain, redness, or swelling at the site of the IV infusion, please tell your nurse right away   Call your doctor or nurse as soon as possible if any of these symptoms happen:   Decreased urine   Pain in your mouth or throat that makes it  hard to eat or drink   Nausea and throwing up that is not relieved by prescribed medicines   Rash that is not relieved by prescribed medicines   Swelling of legs, ankles, or feet   Weight gain of 5 pounds in one week (fluid retention)   Lasting loss of appetite or rapid weight loss of five pounds in a week   Fatigue that interferes with your daily activities   Extreme weakness that interferes with normal activities   Sexual Problems and Reproduction Concerns   Infertility warning: Sexual problems and reproduction concerns may happen. In both men and women, this drug may affect your ability to have children. This cannot be determined before your treatment. Talk with your doctor or nurse if you plan to have children. Ask for information on sperm or egg banking.   In men, this drug may interfere with your ability to make sperm, but it should not change your ability to have sexual relations.   In women, menstrual bleeding may become irregular or stop while you are getting this drug. Do not assume that you cannot become pregnant if you do not have a menstrual period.   Women may go through signs of menopause (change of life) like vaginal dryness or itching. Vaginal lubricants can be used to lessen vaginal dryness, itching, and pain during sexual relations.   Genetic counseling is available for you to talk about the effects of this drug therapy on future pregnancies. Also, a genetic counselor can look at the possible risk of problems in the unborn baby due to this medicine if an exposure happens during pregnancy.   Pregnancy warning: This drug may have harmful effects on the unborn baby, so effective methods of birth control should be used by you and your partner during your cancer treatment and for at least 6 months after treatment   Breast feeding warning: Women should not breast feed during treatment because this drug could enter the breast milk and badly harm a breast feeding  baby.   Vincristine sulfate (Generic Name) Other Names: Oncovin, Vincasar, VCR  About This Drug  Vincristine is a drug used to treat cancer. It is given in the vein (IV).  This drug will take 10 minutes to infuse.   Possible Side Effects (More  Common)   Constipation (not able to move bowels)   Hair loss. You may notice hair getting thin. Some patients lose their hair. Your hair often grows back when treatment is done. Most often hair loss is temporary; your hair should grow back when treatment is done.   Effects on the nerves are called peripheral neuropathy. You may feel numbness, tingling, or pain in your hands and feet. It may be hard for you to button your clothes, open jars, or walk as usual. The effect on the nerves may get worse with more doses of the drug. These effects get better in some people after the drug is stopped but it does not get better in all people.   Possible Side Effects (Less Common)   Soreness of the mouth and throat. You may have red areas, white patches, or sores that hurt.   Swelling of your legs, ankles, and/or feet   Skin and tissue irritation may involve redness, pain, warmth, or swelling at the IV site. This happens if the drug leaks out of the vein and into nearby tissue.   Blood pressure changes. Some patients have low blood pressure and some patients have high blood pressure.   Jaw pain   Hoarseness   Blurred or double vision or other changes in eyesight may happen but are a rare side effect.   Feeling dizzy or lightheaded   Drooping eyelids are a rare side effect.   Depression   Rash   Bone marrow depression. This is a decrease in the number of white blood cells, red blood cells, and platelets. This may raise your risk of infection, make you tired and weak (fatigue), and raise your risk of bleeding.   Chest pain or symptoms of a heart attack. Most heart attacks involve pain in the center of the chest that lasts more than a few  minutes. The pain may go away and come back or it can be constant. It can feel like pressure, squeezing, fullness, or pain. Sometimes pain is felt in one or both arms, the back, neck, jaw, or stomach. If any of these symptoms last 2 minutes, call 911.   Trouble sleeping   Lack of appetite; weight loss   Allergic Reactions  Serious allergic reactions including anaphylaxis are rare. While you are getting this drug in your vein (IV), tell your nurse right away if you have any of these symptoms of an allergic reaction:   Trouble catching your breath   Feeling like your tongue or throat are swelling   Feeling your heart beat quickly or in a not normal way (palpitations)   Feeling dizzy or lightheaded   Flushing, itching, rash, and/or hives   Treating Side Effects   While you are getting this drug in your IV, please tell your nurse right away if you have any pain, redness, or swelling at the site of the IV infusion.   Ask your doctor or nurse how to prevent or lessen constipation. Constipation can become a serious side effect. If you are constipated, check with your doctor or nurse before you use enemas, laxatives, or suppositories.   Drink 6-8 cups of fluids each day unless your doctor has told you to limit your fluid intake due to some other health problem. A cup is 8 ounces of fluid. If you throw up or have loose bowel movements, you should drink more fluids so that you do not become dehydrated (lack water in the body from losing too much fluid).  Mouth care is very important. Your mouth care should consist of gently cleaning your teeth or dentures and rinsing your mouth with a mixture of  teaspoon salt in 8 ounces of water or  teaspoon sodium bicarbonate (baking soda) in 8 ounces of water. This should be done at least after each meal and at bedtime.   If you have mouth sores, avoid mouthwash that has alcohol. Also avoid alcohol and smoking because they can bother your mouth and  throat.   If you get a rash do not put anything on it unless your doctor or nurse says you may. Keep the area around the rash clean and dry. Ask your doctor for medicine if your rash bothers you.   If you have numbness and tingling in your hands and feet, be careful when cooking, walking, and handling sharp objects and hot liquids.   Talk with your nurse about getting a wig before you lose your hair. Also, call the [employer] at 800-ACS-2345 to find out information about the "Look Good, Feel Better" program close to where you live. It is a free program where women getting chemotherapy can learn about wigs, turbans and scarves as well as makeup techniques and skin and nail care.   Food and Drug Interactions   There are known interactions of Vincristine with grapefruit. Do not eat grapefruit or drink grapefruit juice while taking this drug.   Check with your doctor or pharmacist about all other prescription medicines and dietary supplements you are taking before starting this medicine as there are a lot of known drug interactions with Vincristine. Also, check with your doctor or pharmacist before starting any new prescription, over-the-counter medicines, or dietary supplement to make sure that there are no interactions.   When to Call the Doctor  Call your doctor or nurse right away if you have any of these symptoms:   Redness, pain, warmth, or swelling at the IV site while you are getting this drug   No bowel movement in 3 days or when you feel uncomfortable.   Abdominal pain   Fever of 100.4 F (38 C) or higher   Chills   Feeling short of breath   Easy bleeding or bruising   Jaw pain   Hoarseness   Drooping eyelids   Blurred vision or other changes in eyesight   Feeling confused, restless, or irritable   Seizures (Common symptoms of a seizure can include confusion, blacking out, passing out, loss of hearing or vision, blurred vision, unusual smells or tastes  (such as burning rubber), trouble talking, tremors or shaking in parts or all of the body, repeated body movements, tense muscles that do not relax, and loss of control of urine and bowels. There are other less common symptoms of seizures. If you or your family member suspects you are having a seizure, call 911 right away).   Hallucinations (seeing, hearing, or feeling things that are not really there)   Feeling   Rash   Chest pain or symptoms of a heart attack. Most heart attacks involve pain in the center of the chest that lasts more than a few minutes. The pain may go away and come back. It can feel like pressure, squeezing, fullness, or pain. Sometimes pain is felt in one or both arms, the back, neck, jaw, or stomach. If any of these symptoms last 2 minutes, call 911.  Call your doctor or nurse as soon as possible if you have any of these symptoms:  Pain in fingers, toes, joints, or testicles   Numbness, tingling, or decreased feeling or weakness in fingers, toes, arms, or legs   Trouble walking or changes in the way you walk   Swelling of your legs, ankles, and/or feet   Headache   Pain in your mouth or throat that makes it hard to eat or drink   Loss of appetite or weight loss   Hearing changes   Depression   Trouble sleeping   Reproduction Concerns   Women may go through signs of menopause (change of life) like vaginal dryness or itching. Vaginal lubricants can be used to lessen vaginal dryness, itching, and pain during sexual relations.   Pregnancy warning: This drug may have harmful effects on the unborn child, so effective methods of birth control should be used during your cancer treatment.   Breast feeding warning: It is not known if this drug passes into breast milk. For this reason, women should talk to their doctor about the risks and benefits of breast feeding during treatment with this drug because this drug may enter the breast milk and badly harm a breast  feeding baby.   Cyclophosphamide (Generic Name) Other Names: Cytoxan, Neosar  About This Drug  Cyclophosphamide is a drug used to treat cancer. It is given in the vein (IV) or by mouth. This drug takes 30 minutes to infuse.  Possible Side Effects (More Common)   Nausea and throwing up (vomiting). These symptoms may happen within a few hours after your treatment and may last up to 72 hours. Medicines are available to stop or lessen these side effects.   Bone marrow depression. This is a decrease in the number of white blood cells, red blood cells, and platelets. This may raise your risk of infection, make you tired and weak (fatigue), and raise your risk of bleeding.   Hair loss: You may notice hair getting thin. Some patients lose their hair. Hair loss is often complete scalp hair loss and can involve loss of eyebrows, eyelashes, and pubic hair. You may notice this a few days or weeks after treatment has started. Most often hair loss is temporary; your hair should grow back when treatment is done.   Decreased appetite (decreased hunger)   Blurred vision   Soreness of the mouth and throat. You may have red areas, white patches, or sores that hurt.   Effects on the bladder. This drug may cause irritation and bleeding in the bladder. You may have blood in your urine. To help stop this, you will get extra fluids to help you pass more urine. You may get a drug called mesna, which helps to decrease irritation and bleeding. You may also get a medicine to help you pass more urine. You may have a catheter (tube) placed in your bladder so that your bladder will be washed with this drug.  Possible Side Effects (Less Common)   Darkening of the skin or nails   Metallic taste in the mouth   Changes in lung tissue may happen with large amounts of this drug. These changes may not last forever, and your lung tissue may go back to normal. Sometimes these changes may not be seen for many years. You  may get a cough or have trouble catching your breath.   Allergic Reactions  Serious allergic reactions including anaphylaxis are rare. While you are getting this drug in your vein (IV), tell your nurse right away if you have any of these symptoms of an allergic  reaction:   Trouble catching your breath   Feeling like your tongue or throat are swelling   Feeling your heart beat quickly or in a not normal way (palpitations)   Feeling dizzy or lightheaded   Flushing, itching, rash, and/or hives   Treating Side Effects   Drink 6-8 cups of fluids each day unless your doctor has told you to limit your fluid intake due to some other health problem. A cup is 8 ounces of fluid. If you throw up or have loose bowel movements you should drink more fluids so that you do not become dehydrated (lack water in the body due to losing too much fluid).   Ask your doctor or nurse about medicine that is available to help stop or lessen nausea or throwing up.   Mouth care is very important. Your mouth care should consist of routine, gentle cleaning of your teeth or dentures and rinsing your mouth with a mixture of 1/2 teaspoon of salt in 8 ounces of water or  teaspoon of baking soda in 8 ounces of water. This should be done at least after each meal and at bedtime.   If you have mouth sores, avoid mouthwash that has alcohol. Also avoid alcohol and smoking because they can bother your mouth and throat.   Talk with your nurse about getting a wig before you lose your hair. Also, call the [employer] at 800-ACS-2345 to find out information about the " Look Good.Marland KitchenMarland KitchenFeel Better" program close to where you live. It is a free program where women undergoing chemotherapy learn about wigs, turbans and scarves as well as makeup techniques and skin and nail care.  Important Information   Whenever you tell a doctor or nurse your health history, always tell them that you have received cyclophosphamide in the  past.   If you take this drug by mouth swallow the medicine whole. Do not chew, break or crush it.   You can take the medicine with or without food. If you have nausea, take it with food. Do not take the pills at bedtime.  Food and Drug Interactions  There are no known interactions of cyclophosphamide with food. This drug may interact with other medicines. Tell your doctor and pharmacist about all the medicines and dietary supplements (vitamins, minerals, herbs and others) that you are taking at this time. The safety and use of dietary supplements and alternative diets are often not known. Using these might affect your cancer or interfere with your treatment. Until more is known, you should not use dietary supplements or alternative diets without your cancer doctor's help.   When to Call the Doctor  Call your doctor or nurse right away if you have any of these symptoms:   Fever of 100.4 F (38 C) or higher   Chills   Bleeding or bruising that is not normal   Blurred vision or other changes in eyesight   Pain when passing urine; blood in urine   Pain in your lower back or side   Wheezing or trouble breathing   Swelling of legs, ankles, or feet   Feeling dizzy or lightheaded   Feeling confused or agitated   Signs of liver problems: dark urine, pale bowel movements, bad stomach pain, feeling very tired and weak, unusual itching, or yellowing of the eyes or skin   Unusual thirst or passing urine often   Nausea that stops you from eating or drinking   Throwing up/vomiting  Call your doctor or nurse  as soon as possible if any of these symptoms happen:   Pain in your mouth or throat that makes it hard to eat or drink   Nausea not relieved by prescribed medicines   Sexual Problems and Reproductive Concerns   Infertility warning: Sexual problems and reproduction concerns may happen. In both men and women, this drug may affect your ability to have children. This cannot be  determined before your treatment. Talk with your doctor or nurse if you plan to have children. Ask for information on sperm or egg banking.   In men, this drug may interfere with your ability to make sperm, but it should not change your ability to have sexual relations.   In women, menstrual bleeding may become irregular or stop while you are getting this drug. Do not assume that you cannot become pregnant if you do not have a menstrual period.   Women may go through signs of menopause (change of life) like vaginal dryness or itching. Vaginal lubricants can be used to lessen vaginal dryness, itching, and pain during sexual relations.   Genetic counseling is available for you to talk about the effects of this drug therapy on future pregnancies. Also, a genetic counselor can look at the possible risk of problems in the unborn baby due to this medicine if an exposure happens during pregnancy.   Pregnancy warning: This drug may have harmful effects on the unborn child, so effective methods of birth control should be used during your cancer treatment.   Breast feeding warning: Women should not breast feed during treatment because this drug could enter the breast milk and badly harm a breast feeding baby.   Rituximab (Generic Name) Other Name: Rituxan  About This Drug  Rituximab is a monoclonal antibody used to treat cancer. This drug is given in the vein (IV).  This first time this is given it will be infused slower to monitor for infusion reactions.    Possible Side Effects (More Common)   Bone marrow depression. This is a decrease in the number of white blood cells, red blood cells, and platelets. This  may raise your risk of infection, make you tired and weak (fatigue), and raise your risk of bleeding.   Rash-skin irritation, redness or itching (dermatitis)   Flu-like symptoms: fever, headache, muscle and joint aches, and fatigue (low energy, feeling weak)   Infusion-related  reactions   Hepatitis B - if you have ever had hepatitis B, the virus may come back during treatment with this drug. Your doctor will test to see if you have ever had hepatitis B prior to your treatment.   Changes in your central nervous system can happen. The central nervous system is made up of your brain and spinal cord. You could feel: extreme tiredness, agitation, confusion, or have: hallucinations (see or hear things that are not there), trouble understanding or speaking, loss of control of your bowels or bladder, eyesight changes, numbness or lack of strength to your arms, legs, face, or body, seizures or coma. If you start to have any of these symptoms let your doctor know right away.   Tumor lysis: This drug may act on the cancer cells very quickly. This may affect how your kidneys work. Your doctor will monitor your kidney function.   Changes in your liver function. Your doctor will check your liver function as needed.   Nausea and throwing up (vomiting): these symptoms may happen within a few hours after your treatment and may last up to  24 hours. Medicines are available to stop or lessen these side effects.   Loose bowel movements (diarrhea) that may last for a few days   Abdominal pain   Infections   Cough, runny nose   Swelling of your legs, ankles and/or feet or hands   High blood pressure. Your doctor will check your blood pressure as needed.   Abnormal heart beat  Possible Side Effects (Less Common)   Shortness of breath   Soreness of the mouth and throat. You may have red areas, white patches, or sores that hurt.  Infusion Reactions  Infusion Reactions are the most common side effect linked to use of this drug and can be quite severe. Medicines will be given before you get the drug to lower the severity of this side effect. The infusion reactions are the worse with the first dose of the drug and become less severe with more doses of the drug. While you are getting  this drug in your vein (IV), tell your nurse right away if you have any of these symptoms of an allergic reaction:   Trouble catching your breath   Feeling like your tongue or throat are swelling   Feeling your heart beat quickly or in a not normal way (palpitations)   Feeling dizzy or lightheaded   Flushing, itching, rash, and/or hives   Treating Side Effects   Ask your doctor or nurse about medicine to stop or lessen headache, loose bowel movements (diarrhea), constipation, nausea, throwing up (vomiting), or pain.   If you get a rash do not put anything it unless your doctor or nurse says you may. Keep the area around the rash clean and dry. Ask your doctor for medicine if the rash bothers you.   Drink 6-8 cups of fluids each day unless your doctor has told you to limit your fluid intake due to some other health problem. A cup is 8 ounces of fluid. If you throw up or have loose bowel movements, you should drink more fluids so that you do not become dehydrated (lack of water in the body from losing too much fluid).   If you are not able to move your bowels, check with your doctor or nurse before you use enemas, laxatives, or suppositories   If you have mouth sores, avoid mouthwash that has alcohol. Also avoid alcohol and smoking because they can bother your mouth and throat.   If you have a nose bleed, sit with your head tipped slightly forward. Apply pressure by lightly pinching the bridge of your nose between your thumb and forefinger. Call your doctor if you feel dizzy or faint or if the bleeding doesn't stop after 10 to 15 minutes   Important Information   After treatment with this drug, vaccination with live viruses should be delayed until the immune system recovers.   Symptoms of abnormal bleeding may be: coughing up blood, throwing up blood (may look like coffee grounds), red or black, tarry bowel movements, blood in urine, abnormally heavy menstrual flow, nosebleeds, or any  unusual bleeding.   Symptoms of high blood pressure may be: headache, blurred vision, confusion, chest pain, or a feeling that your heart is beat differently.   Urinary tract infection. Symptoms may include:   Pain or burning when you pass urine   Feeling like you have to pass urine often, but not much comes out when you do.   Tender or heavy feeling in your lower abdomen   Cloudy urine and/or urine that  smells bad.   Pain on one side of your back under your ribs. This is where your kidneys are.   Fever, chills, nausea and/or throwing up   Food and Drug Interactions  There are no known interactions of rituximab and any food. This drug may interact with other medicines. Tell your doctor and pharmacist about all the medicines and dietary supplements (vitamins, minerals, herbs and others) that you are taking at this time. The safety and use of dietary supplements and alternative diets are often not known. Using these might affect your cancer or interfere with your treatment. Until more is known, you should not use dietary supplements or alternative diets without your cancer doctor's help.   When to Call the Doctor  Call your doctor or nurse right away if you have any of these symptoms:   Fever of 100.4 F (38 C) higher   Chills   Trouble breathing   Rash with or without itching   Blistering or peeling of skin   Chest pain or symptoms of a heart attack. Most heart attacks involve pain in the center of the chest that lasts more than a few minutes. The pain may go away and come back or it can be constant. It can feel like pressure, squeezing, fullness, or pain. Sometimes pain is felt in one or both arms, the back, neck, jaw, or stomach. If any of these symptoms last 2 minutes, call 911   Easy bleeding or bruising   Blood in urine or bowel movements   Feeling that your heart is beating in a fast or not normal way (palpitations)   Nausea that stops you from eating or drinking    Throwing up/vomiting   Abdominal pain   Loose bowel movements (diarrhea) 4 times in one day or diarrhea with weakness or lightheadedness   No bowel movement in 3 days or if you feel uncomfortable   Feeling dizzy or lightheaded   Changes in your speech or vision   Feeling confused   Weakness of your arms and legs or poor coordination (feeling clumsy)   Signs of liver problems: dark urine, pale bowel movements, bad stomach pain, feeling very tired and weak, unusual itching, or yellowing of skin or eyes   Symptoms of a urinary tract infection (see important information)   Call your doctor or nurse as soon as possible if you have any of these symptoms:   Swelling of your legs, ankles and/or feet   Fatigue and /or weakness that interferes with your daily activities   Joint and muscle pain or muscle spasms that are not relieved by prescribed medicines   Cough that lasts longer than normal   Reproduction Concerns   Pregnancy warning: This drug is known to cross the placenta. This drug may have harmful effects on an unborn baby. Effective methods of birth control should be used during treatment with this drug and for 12 months after the last treatment. If exposure occurs to an unborn baby, the baby's immune system may be affected, which could last for months after birth. Until the immune system recovers, live vaccines should not be administered to the baby. Be sure to talk with your doctor if you are pregnant or planning to become pregnant while getting this drug.   Breast feeding warning: It is not known if rituximab is passed into human breast milk. In animal studies, this drug was detected in in breast milk. For this reason, women should talk to their doctor about the risks and  benefits of breast feeding during treatment with this drug because this drug may enter the breast milk and badly harm a breast feeding baby.   Prednisone  About This Drug  Prednisone is a steroid that may  be used to treat cancer. It is given orally (by mouth).  You will take this medication (100 mg daily with food) on Days 1-5.  Day 1 is each day you come to the clinic to get chemotherapy treatment.  You will start the prednisone on that day, and continue to take daily for the four days after, to total 5 days.  You will do this with each cycle of chemotherapy.  You do not take this medication on the weeks you are not getting treatment.   Possible Side Effects   Abnormal heart beat   Headache   High blood pressure   Increased sweating   Blurred vision or other changes in eyesight   Tiredness and weakness   Changes in mood, which may include depression or a feeling of extreme well-being   Trouble sleeping   Feeling restless (unable to relax)   Skin changes such as rash, dryness, or redness   Increased appetite (increased hunger)   Weight gain   Electrolyte changes   Increased risk of infections   Aggravation of stomach ulcers   Pain in your abdomen   Nausea   Blood sugar levels may change   Swelling of your legs, ankles and/or feet   Muscle loss and/or weakness (lack of muscle strength)   Increased risk of developing osteoporosis- your bones may become weak and brittle   Increased risk for cataracts, glaucoma or infections of the eye   Changes in your liver function  Note: Not all possible side effects are included above.  Warnings and Precautions   High blood pressure and changes in electrolytes, which can cause fluid build-up around your heart, lungs or elsewhere.   Severe infections, which can be life-threatening.   Allergic reactions, including anaphylaxis are rare but may happen in some patients. Signs of allergic reaction to this drug may be swelling of the face, feeling like your tongue or throat are swelling, trouble breathing, rash, itching, fever, chills, feeling dizzy, and/or feeling that your heart is beating in a fast or not normal way. If this  happens, do not take another dose of this drug. You should get urgent medical treatment.   Increased risk of developing a hole in your stomach, small, and/or large intestine if you have ulcers in the lining of your stomach and/or intestine, or have diverticulitis, ulcerative colitis and/or other diseases that affect the gastrointestinal tract.   Blurred vision or other changes in eyesight.   Severe depression and other psychiatric disorders such as mood changes.   Effects on the endocrine glands including pituitary, adrenals and thyroid during or after use of this medication.  Note: Some of the side effects above are very rare. If you have concerns and/or questions, please discuss them with your medical team.  Important Information   Talk to your doctor or your nurse before stopping this medication, it should be stopped gradually. Depending on the dose and length of treatment, you could experience serious side effects if stopped abruptly (suddenly).   Talk to your doctor before receiving any vaccinations during your treatment. Some vaccinations are not recommended while receiving prednisone.  How to Take Your Medication   For Oral (by mouth): You can take the medicine with or without food, but it is preferable to  be taken with food, especially If you have nausea or upset stomach.   Missed dose: If you miss or vomit a dose, contact your physician. Do not take 2 doses at the same time and do not double up on the next dose.   Handling: Wash your hands after handling your medicine, your caretakers should not handle your medicine with bare hands and should wear latex gloves.   Storage: Store this medicine in the original container at room temperature, protect from moisture. Discuss with your nurse or your doctor how to dispose of unused medicine.   Treating Side Effects   Drink plenty of fluids (a minimum of eight glasses per day is recommended).   To help with nausea and vomiting, eat  small, frequent meals instead of three large meals a day. Choose foods and drinks that are at room temperature. Ask your nurse or doctor about other helpful tips and medicine that is available to help stop or lessen these symptoms.   If you throw up, you should drink more fluids so that you do not become dehydrated (lack of water in the body from losing too much fluid).   Manage tiredness by pacing your activities for the day. Be sure to include periods of rest between energy-draining activities.   To help with muscle weakness, get regular exercise. If you feel too tired to exercise vigorously, try taking a short walk.   If you are having trouble sleeping, talk to your nurse or doctor on tips to help you sleep better.   If you are feeling depressed, talk to your nurse or doctor about it.   Keeping your pain under control is important to your well-being. Please tell your doctor or nurse if you are experiencing pain.   If you have diabetes, keep good control of your blood sugar level. Tell your nurse or your doctor if your glucose levels are higher or lower than normal.   To decrease the risk of infection, wash your hands regularly.   Avoid close contact with people who have a cold, the flu, or other infections.   Take your temperature as your doctor or nurse tells you, and whenever you feel like you may have a fever.   If you get a rash do not put anything on it unless your doctor or nurse says you may. Keep the area around the rash clean and dry. Ask your doctor for medicine if your rash bothers you.   Moisturize your skin several times a day   Avoid sun exposure and apply sunscreen routinely when outdoors.   Food and Drug Interactions   This drug may interact with grapefruit and grapefruit juice. Talk to your doctor as this could make side effects worse.   Check with your doctor or pharmacist about all other prescription medicines and over-the-counter medicines and dietary  supplements (vitamins, minerals, herbs and others) you are taking before starting this medicine as there are known drug interactions with prednisone. Also, check with your doctor or pharmacist before starting any new prescription or over-the-counter medicines, or dietary supplements to make sure that there are no interactions.   There are known interactions of prednisone with other medicines and products like aspirin, and other nonsteroidal anti-inflammatory agents. Ask your doctor what over-the-counter (OTC) are safe to take while taking this medication.    There are known interactions of prednisone with blood thinning medicine such as warfarin. Ask your doctor what precautions you should take.   Avoid the use of St. John's  Wort while taking prednisone as this may lower the levels of the drug in your body, which can make it less effective.   When to Call the Doctor  Call your doctor or nurse if you have any of these symptoms and/or any new or unusual symptoms:   Fever of 100.4 F (38 C) or higher   Chills   A headache that does not go away   Blurry vision or other changes in eyesight   Feel irritable, nervous or restless   Trouble sleeping   Tiredness or weakness that interferes with your daily activities   Trouble breathing   Feeling that your heart is beating in a fast or not normal way (palpitations)   Chest pain or symptoms of a heart attack. Most heart attacks involve pain in the center of the chest that lasts more than a few minutes. The pain may go away and come back or it can be constant. It can feel like pressure, squeezing, fullness, or pain. Sometimes pain is felt in one or both arms, the back, neck, jaw, or stomach. If any of these symptoms last 2 minutes, call 911.   Nausea that stops you from eating or drinking and/or is not relieved by prescribed medicines   Throwing up   Severe abdominal pain that does not go away   Heartburn or indigestion   Abnormal blood  sugar   Severe mood changes such as depression or unusual thoughts and/or behaviors   Thoughts of hurting yourself or others, and suicide   Feeling hopeless on most days   Unusual thirst, passing urine often, headache, sweating, shakiness, irritability   Swelling of legs, ankles, or feet   Weight gain of 5 pounds in one week (fluid retention)   A new rash or a rash that is not relieved by prescribed medicines   Signs of possible liver problems: dark urine, pale bowel movements, bad stomach pain, feeling very tired and weak, unusual itching, or yellowing of the eyes or skin   Signs of allergic reaction: swelling of the face, feeling like your tongue or throat are swelling, trouble breathing, rash, itching, fever, chills, feeling dizzy, and/or feeling that your heart is beating in a fast or not normal way. If this happens, call 911 for emergency care.   If you think you may be pregnant  Reproduction Warnings   Pregnancy warning: It is not known if this drug may harm an unborn child. For this reason, be sure to talk with your doctor if you are pregnant or planning to become pregnant while receiving this drug. Let your doctor know right away if you think you may be pregnant.   Breastfeeding warning: It is not known if this drug passes into breast milk. For this reason, women should talk to their doctor about the risks and benefits of breastfeeding during treatment with this drug because this drug may enter the breast milk and cause harm to a breastfeeding baby.   Fertility warning: Human fertility studies have not been done with this drug. Talk with your doctor or nurse if you plan to have children. Ask for information on sperm or egg banking.  You will get Neulasta (or similar) after every treatment of chemo:  Neulasta (or similar) - this medication is not chemotherapy but is being given because you have had chemo. It is usually given 24-48 hours after the completion of chemotherapy.  This medication works by boosting your bone marrow's supply of white blood cells. White blood cells are  what protect our bodies against infection. The medication is given in the form of a subcutaneous injection. It is given in the fatty tissue of your abdomen, or in the skin on the back of your arm . It is a short needle. The major side effect of this medication is bone or muscle pain. The drug of choice to relieve or lessen the pain is Aleve or Ibuprofen. If a physician has ever told you not to take Aleve or Ibuprofen - then don't take it. You should then take Tylenol/acetaminophen. Take either medication as the bottle directs you to.  The level of pain you experience as a result of this injection can range from none, to mild or moderate, or severe. Please let us know if you develop moderate or severe bone pain.   You can take Claritin 10 mg over the counter for a few days after receiving Neulasta to help with the bone aches and pains.      SELF CARE ACTIVITIES WHILE RECEIVING CHEMOTHERAPY:  Hydration Increase your fluid intake 48 hours prior to treatment and drink at least 8 to 12 cups (64 ounces) of water/decaffeinated beverages per day after treatment. You can still have your cup of coffee or soda but these beverages do not count as part of your 8 to 12 cups that you need to drink daily. No alcohol intake.  Medications Continue taking your normal prescription medication as prescribed.  If you start any new herbal or new supplements please let us know first to make sure it is safe.  Mouth Care Have teeth cleaned professionally before starting treatment. Keep dentures and partial plates clean. Use soft toothbrush and do not use mouthwashes that contain alcohol. Biotene is a good mouthwash that is available at most pharmacies or may be ordered by calling [phone]. Use warm salt water gargles (1 teaspoon salt per 1 quart warm water) before and after meals and at bedtime. If you need dental work,  please let the doctor know before you go for your appointment so that we can coordinate the best possible time for you in regards to your chemo regimen. You need to also let your dentist know that you are actively taking chemo. We may need to do labs prior to your dental appointment.  Skin Care Always use sunscreen that has not expired and with SPF (Sun Protection Factor) of 50 or higher. Wear hats to protect your head from the sun. Remember to use sunscreen on your hands, ears, face, & feet.  Use good moisturizing lotions such as udder cream, eucerin, or even Vaseline. Some chemotherapies can cause dry skin, color changes in your skin and nails.    Avoid long, hot showers or baths. Use gentle, fragrance-free soaps and laundry detergent. Use moisturizers, preferably creams or ointments rather than lotions because the thicker consistency is better at preventing skin dehydration. Apply the cream or ointment within 15 minutes of showering. Reapply moisturizer at night, and moisturize your hands every time after you wash them.  Hair Loss (if your doctor says your hair will fall out)  If your doctor says that your hair is likely to fall out, decide before you begin chemo whether you want to wear a wig. You may want to shop before treatment to match your hair color. Hats, turbans, and scarves can also camouflage hair loss, although some people prefer to leave their heads uncovered. If you go bare-headed outdoors, be sure to use sunscreen on your scalp. Cut your hair short. It  eases the inconvenience of shedding lots of hair, but it also can reduce the emotional impact of watching your hair fall out. Don't perm or color your hair during chemotherapy. Those chemical treatments are already damaging to hair and can enhance hair loss. Once your chemo treatments are done and your hair has grown back, it's OK to resume dyeing or perming hair.  With chemotherapy, hair loss is almost always temporary. But when it  grows back, it may be a different color or texture. In older adults who still had hair color before chemotherapy, the new growth may be completely gray.  Often, new hair is very fine and soft.  Infection Prevention Please wash your hands for at least 30 seconds using warm soapy water. Handwashing is the #1 way to prevent the spread of germs. Stay away from sick people or people who are getting over a cold. If you develop respiratory systems such as green/yellow mucus production or productive cough or persistent cough let us know and we will see if you need an antibiotic. It is a good idea to keep a pair of gloves on when going into grocery stores/[employer] to decrease your risk of coming into contact with germs on the carts, etc. Carry alcohol hand gel with you at all times and use it frequently if out in public. If your temperature reaches 100.4 or higher please call the clinic and let us know.  If it is after hours or on the weekend please go to the ER if your temperature is over 100.4.  Please have your own personal thermometer at home to use.    Sex and bodily fluids If you are going to have sex, a condom must be used to protect the person that isn't taking chemotherapy. Chemo can decrease your libido (sex drive). For a few days after chemotherapy, chemotherapy can be excreted through your bodily fluids.  When using the toilet please close the lid and flush the toilet twice.  Do this for a few day after you have had chemotherapy.   Effects of chemotherapy on your sex life Some changes are simple and won't last long. They won't affect your sex life permanently.  Sometimes you may feel: too tired not strong enough to be very active sick or sore  not in the mood anxious or low  Your anxiety might not seem related to sex. For example, you may be worried about the cancer and how your treatment is going. Or you may be worried about money, or about how you family are coping with your illness. These  things can cause stress, which can affect your interest in sex. It's important to talk to your partner about how you feel. Remember - the changes to your sex life don't usually last long. There's usually no medical reason to stop having sex during chemo. The drugs won't have any long term physical effects on your performance or enjoyment of sex. Cancer can't be passed on to your partner during sex  Contraception It's important to use reliable contraception during treatment. Avoid getting pregnant while you or your partner are having chemotherapy. This is because the drugs may harm the baby. Sometimes chemotherapy drugs can leave a man or woman infertile.  This means you would not be able to have children in the future. You might want to talk to someone about permanent infertility. It can be very difficult to learn that you may no longer be able to have children. Some people find counselling helpful. There might  be ways to preserve your fertility, although this is easier for men than for women. You may want to speak to a fertility expert. You can talk about sperm banking or harvesting your eggs. You can also ask about other fertility options, such as donor eggs. If you have or have had breast cancer, your doctor might advise you not to take the contraceptive pill. This is because the hormones in it might affect the cancer.  It is not known for sure whether or not chemotherapy drugs can be passed on through semen or secretions from the vagina. Because of this some doctors advise people to use a barrier method if you have sex during treatment. This applies to vaginal, anal or oral sex. Generally, doctors advise a barrier method only for the time you are actually having the treatment and for about a week after your treatment. Advice like this can be worrying, but this does not mean that you have to avoid being intimate with your partner. You can still have close contact with your partner and continue to enjoy  sex.  Animals If you have cats or birds we just ask that you not change the litter or change the cage.  Please have someone else do this for you while you are on chemotherapy.   Food Safety During and After Cancer Treatment Food safety is important for people both during and after cancer treatment. Cancer and cancer treatments, such as chemotherapy, radiation therapy, and stem cell/bone marrow transplantation, often weaken the immune system. This makes it harder for your body to protect itself from foodborne illness, also called food poisoning. Foodborne illness is caused by eating food that contains harmful bacteria, parasites, or viruses.  Foods to avoid Some foods have a higher risk of becoming tainted with bacteria. These include: Unwashed fresh fruit and vegetables, especially leafy vegetables that can hide dirt and other contaminants Raw sprouts, such as alfalfa sprouts Raw or undercooked beef, especially ground beef, or other raw or undercooked meat and poultry Fatty, fried, or spicy foods immediately before or after treatment.  These can sit heavy on your stomach and make you feel nauseous. Raw or undercooked shellfish, such as oysters. Sushi and sashimi, which often contain raw fish.  Unpasteurized beverages, such as unpasteurized fruit juices, raw milk, raw yogurt, or cider Undercooked eggs, such as soft boiled, over easy, and poached; raw, unpasteurized eggs; or foods made with raw egg, such as homemade raw cookie dough and homemade mayonnaise  Simple steps for food safety  Shop smart. Do not buy food stored or displayed in an unclean area. Do not buy bruised or damaged fruits or vegetables. Do not buy cans that have cracks, dents, or bulges. Pick up foods that can spoil at the end of your shopping trip and store them in a cooler on the way home.  Prepare and clean up foods carefully. Rinse all fresh fruits and vegetables under running water, and dry them with a clean towel or  paper towel. Clean the top of cans before opening them. After preparing food, wash your hands for 20 seconds with hot water and soap. Pay special attention to areas between fingers and under nails. Clean your utensils and dishes with hot water and soap. Disinfect your kitchen and cutting boards using 1 teaspoon of liquid, unscented bleach mixed into 1 quart of water.    Dispose of old food. Eat canned and packaged food before its expiration date (the "use by" or "best before" date). Consume refrigerated leftovers  within 3 to 4 days. After that time, throw out the food. Even if the food does not smell or look spoiled, it still may be unsafe. Some bacteria, such as Listeria, can grow even on foods stored in the refrigerator if they are kept for too long.  Take precautions when eating out. At restaurants, avoid buffets and salad bars where food sits out for a long time and comes in contact with many people. Food can become contaminated when someone with a virus, often a norovirus, or another "bug" handles it. Put any leftover food in a "to-go" container yourself, rather than having the server do it. And, refrigerate leftovers as soon as you get home. Choose restaurants that are clean and that are willing to prepare your food as you order it cooked.   AT HOME MEDICATIONS:                                                                                                                                                                Compazine/Prochlorperazine 10mg  tablet. Take 1 tablet every 6 hours as needed for nausea/vomiting. (This can make you sleepy)   EMLA cream. Apply a quarter size amount to port site 1 hour prior to chemo. Do not rub in. Cover with plastic wrap.    Diarrhea Sheet   If you are having loose stools/diarrhea, please purchase Imodium and begin taking as outlined:  At the first sign of poorly formed or loose stools you should begin taking Imodium (loperamide) 2 mg capsules.   Take two tablets (4mg ) followed by one tablet (2mg ) every 2 hours - DO NOT EXCEED 8 tablets in 24 hours.  If it is bedtime and you are having loose stools, take 2 tablets at bedtime, then 2 tablets every 4 hours until morning.   Always call the Cancer Center if you are having loose stools/diarrhea that you can't get under control.  Loose stools/diarrhea leads to dehydration (loss of water) in your body.  We have other options of trying to get the loose stools/diarrhea to stop but you must let us know!  Constipation Sheet  Colace - 100 mg capsules - take 2 capsules daily.  If this doesn't help then you can increase to 2 capsules twice daily.  Please call if the above does not work for you. Do not go more than 2 days without a bowel movement.  It is very important that you do not become constipated.  It will make you feel sick to your stomach (nausea) and can cause abdominal pain and vomiting.  Nausea Sheet   Compazine/Prochlorperazine 10mg  tablet. Take 1 tablet every 6 hours as needed for nausea/vomiting (This can make you drowsy).  If you are having persistent nausea (nausea that does not stop) please call the Cancer  Center and let us know the amount of nausea that you are experiencing.  If you begin to vomit, you need to call the Cancer Center and if it is the weekend and you have vomited more than one time and can't get it to stop-go to the Emergency Room.  Persistent nausea/vomiting can lead to dehydration (loss of fluid in your body) and will make you feel very weak and unwell. Ice chips, sips of clear liquids, foods that are at room temperature, crackers, and toast tend to be better tolerated.    SYMPTOMS TO REPORT AS SOON AS POSSIBLE AFTER TREATMENT:  FEVER GREATER THAN 100.4 F  CHILLS WITH OR WITHOUT FEVER  NAUSEA AND VOMITING THAT IS NOT CONTROLLED WITH YOUR NAUSEA MEDICATION  UNUSUAL SHORTNESS OF BREATH  UNUSUAL BRUISING OR BLEEDING  TENDERNESS IN MOUTH AND THROAT WITH OR WITHOUT    PRESENCE OF ULCERS  URINARY PROBLEMS  BOWEL PROBLEMS  UNUSUAL RASH    Wear comfortable clothing and clothing appropriate for easy access to any Portacath or PICC line. Let us know if there is anything that we can do to make your therapy better!   What to do if you need assistance after hours or on the weekends: CALL [phone].  HOLD on the line, do not hang up.  You will hear multiple messages but at the end you will be connected with a nurse triage line.  They will contact the doctor if necessary.  Most of the time they will be able to assist you.  Do not call the hospital operator.     I have been informed and understand all of the instructions given to me and have received a copy. I have been instructed to call the clinic [phone] or my family physician as soon as possible for continued medical care, if indicated. I do not have any more questions at this time but understand that I may call the Cancer Center or the Patient Navigator at [phone] during office hours should I have questions or need assistance in obtaining follow-up care.

## 2023-03-21 ENCOUNTER — Inpatient Hospital Stay: Payer: Medicare Other

## 2023-03-21 DIAGNOSIS — C9000 Multiple myeloma not having achieved remission: Secondary | ICD-10-CM

## 2023-03-21 MED ORDER — ALLOPURINOL 300 MG PO TABS: 300.0000 mg | ORAL_TABLET | Freq: Every day | ORAL | 0 refills | Status: DC

## 2023-03-21 MED ORDER — LIDOCAINE-PRILOCAINE 2.5-2.5 % EX CREA: TOPICAL_CREAM | CUTANEOUS | 3 refills | Status: DC

## 2023-03-21 MED ORDER — PREDNISONE 20 MG PO TABS: 60.0000 mg | ORAL_TABLET | Freq: Every day | ORAL | 5 refills | Status: DC

## 2023-03-21 MED ORDER — PROCHLORPERAZINE MALEATE 10 MG PO TABS: 10.0000 mg | ORAL_TABLET | Freq: Four times a day (QID) | ORAL | 6 refills | Status: DC | PRN

## 2023-03-21 NOTE — Progress Notes (Signed)

## 2023-03-22 ENCOUNTER — Other Ambulatory Visit: Payer: Self-pay

## 2023-03-22 NOTE — Telephone Encounter (Signed)
Crystal with called and left message that the lidocaine-prilocaine (emla cream) was approved from today's date for one year.

## 2023-03-23 NOTE — Progress Notes (Signed)
 Pharmacist Chemotherapy Monitoring - Initial Assessment    Anticipated start date: 03/28/23   The following has been reviewed per standard work regarding the patient's treatment regimen: The patient's diagnosis, treatment plan and drug doses, and organ/hematologic function Lab orders and baseline tests specific to treatment regimen  The treatment plan start date, drug sequencing, and pre-medications Prior authorization status  Patient's documented medication list, including drug-drug interaction screen and prescriptions for anti-emetics and supportive care specific to the treatment regimen The drug concentrations, fluid compatibility, administration routes, and timing of the medications to be used The patient's access for treatment and lifetime cumulative dose history, if applicable  The patient's medication allergies and previous infusion related reactions, if applicable   Changes made to treatment plan:  N/A  Follow up needed:  N/A  MM treatment is on hold until completes R-CHOP per MD   Leslie Williamson, RPH, 03/23/2023  11:54 AM

## 2023-03-28 ENCOUNTER — Other Ambulatory Visit (HOSPITAL_COMMUNITY): Payer: Self-pay | Admitting: Hematology

## 2023-03-28 ENCOUNTER — Encounter: Payer: Self-pay | Admitting: Hematology

## 2023-03-28 DIAGNOSIS — C9002 Multiple myeloma in relapse: Secondary | ICD-10-CM | POA: Diagnosis not present

## 2023-03-28 DIAGNOSIS — I5032 Chronic diastolic (congestive) heart failure: Secondary | ICD-10-CM | POA: Diagnosis not present

## 2023-03-29 ENCOUNTER — Inpatient Hospital Stay: Payer: Medicare Other | Attending: Hematology

## 2023-03-29 ENCOUNTER — Inpatient Hospital Stay: Payer: Medicare Other

## 2023-03-29 VITALS — BP 111/45 | HR 74 | Temp 97.5°F | Resp 16 | Wt 128.8 lb

## 2023-03-29 DIAGNOSIS — I2699 Other pulmonary embolism without acute cor pulmonale: Secondary | ICD-10-CM | POA: Insufficient documentation

## 2023-03-29 DIAGNOSIS — C9000 Multiple myeloma not having achieved remission: Secondary | ICD-10-CM | POA: Diagnosis not present

## 2023-03-29 DIAGNOSIS — Z7984 Long term (current) use of oral hypoglycemic drugs: Secondary | ICD-10-CM | POA: Diagnosis not present

## 2023-03-29 DIAGNOSIS — D509 Iron deficiency anemia, unspecified: Secondary | ICD-10-CM

## 2023-03-29 DIAGNOSIS — Z79624 Long term (current) use of inhibitors of nucleotide synthesis: Secondary | ICD-10-CM | POA: Insufficient documentation

## 2023-03-29 DIAGNOSIS — D631 Anemia in chronic kidney disease: Secondary | ICD-10-CM | POA: Insufficient documentation

## 2023-03-29 DIAGNOSIS — N189 Chronic kidney disease, unspecified: Secondary | ICD-10-CM | POA: Diagnosis not present

## 2023-03-29 DIAGNOSIS — Z79899 Other long term (current) drug therapy: Secondary | ICD-10-CM | POA: Insufficient documentation

## 2023-03-29 DIAGNOSIS — K5900 Constipation, unspecified: Secondary | ICD-10-CM | POA: Insufficient documentation

## 2023-03-29 DIAGNOSIS — C83398 Diffuse large b-cell lymphoma of other extranodal and solid organ sites: Secondary | ICD-10-CM | POA: Diagnosis not present

## 2023-03-29 DIAGNOSIS — I130 Hypertensive heart and chronic kidney disease with heart failure and stage 1 through stage 4 chronic kidney disease, or unspecified chronic kidney disease: Secondary | ICD-10-CM | POA: Diagnosis not present

## 2023-03-29 DIAGNOSIS — R350 Frequency of micturition: Secondary | ICD-10-CM | POA: Diagnosis not present

## 2023-03-29 DIAGNOSIS — Z7901 Long term (current) use of anticoagulants: Secondary | ICD-10-CM | POA: Diagnosis not present

## 2023-03-29 DIAGNOSIS — M7989 Other specified soft tissue disorders: Secondary | ICD-10-CM | POA: Insufficient documentation

## 2023-03-29 DIAGNOSIS — I739 Peripheral vascular disease, unspecified: Secondary | ICD-10-CM | POA: Insufficient documentation

## 2023-03-29 DIAGNOSIS — E876 Hypokalemia: Secondary | ICD-10-CM | POA: Diagnosis not present

## 2023-03-29 DIAGNOSIS — D75839 Thrombocytosis, unspecified: Secondary | ICD-10-CM | POA: Insufficient documentation

## 2023-03-29 DIAGNOSIS — Z7961 Long term (current) use of immunomodulator: Secondary | ICD-10-CM | POA: Diagnosis not present

## 2023-03-29 DIAGNOSIS — R918 Other nonspecific abnormal finding of lung field: Secondary | ICD-10-CM | POA: Diagnosis not present

## 2023-03-29 DIAGNOSIS — Z5189 Encounter for other specified aftercare: Secondary | ICD-10-CM | POA: Diagnosis not present

## 2023-03-29 DIAGNOSIS — G629 Polyneuropathy, unspecified: Secondary | ICD-10-CM | POA: Insufficient documentation

## 2023-03-29 DIAGNOSIS — E7800 Pure hypercholesterolemia, unspecified: Secondary | ICD-10-CM | POA: Diagnosis not present

## 2023-03-29 DIAGNOSIS — Z9221 Personal history of antineoplastic chemotherapy: Secondary | ICD-10-CM | POA: Insufficient documentation

## 2023-03-29 DIAGNOSIS — Z5111 Encounter for antineoplastic chemotherapy: Secondary | ICD-10-CM | POA: Diagnosis not present

## 2023-03-29 LAB — CBC WITH DIFFERENTIAL/PLATELET
Abs Immature Granulocytes: 0.02 10*3/uL (ref 0.00–0.07)
Basophils Absolute: 0.1 10*3/uL (ref 0.0–0.1)
Basophils Relative: 1 %
Eosinophils Absolute: 0 10*3/uL (ref 0.0–0.5)
Eosinophils Relative: 0 %
HCT: 25.6 % — ABNORMAL LOW (ref 36.0–46.0)
Hemoglobin: 7.7 g/dL — ABNORMAL LOW (ref 12.0–15.0)
Immature Granulocytes: 0 %
Lymphocytes Relative: 34 %
Lymphs Abs: 1.9 10*3/uL (ref 0.7–4.0)
MCH: 25.3 pg — ABNORMAL LOW (ref 26.0–34.0)
MCHC: 30.1 g/dL (ref 30.0–36.0)
MCV: 84.2 fL (ref 80.0–100.0)
Monocytes Absolute: 0.8 10*3/uL (ref 0.1–1.0)
Monocytes Relative: 15 %
Neutro Abs: 2.8 10*3/uL (ref 1.7–7.7)
Neutrophils Relative %: 50 %
Platelets: 506 10*3/uL — ABNORMAL HIGH (ref 150–400)
RBC: 3.04 MIL/uL — ABNORMAL LOW (ref 3.87–5.11)
RDW: 20.2 % — ABNORMAL HIGH (ref 11.5–15.5)
Smear Review: INCREASED
WBC: 5.6 10*3/uL (ref 4.0–10.5)
nRBC: 3.9 % — ABNORMAL HIGH (ref 0.0–0.2)

## 2023-03-29 LAB — PREPARE RBC (CROSSMATCH)

## 2023-03-29 LAB — COMPREHENSIVE METABOLIC PANEL
ALT: 11 U/L (ref 0–44)
AST: 12 U/L — ABNORMAL LOW (ref 15–41)
Albumin: 3.1 g/dL — ABNORMAL LOW (ref 3.5–5.0)
Alkaline Phosphatase: 66 U/L (ref 38–126)
Anion gap: 9 (ref 5–15)
BUN: 20 mg/dL (ref 8–23)
CO2: 21 mmol/L — ABNORMAL LOW (ref 22–32)
Calcium: 8.6 mg/dL — ABNORMAL LOW (ref 8.9–10.3)
Chloride: 108 mmol/L (ref 98–111)
Creatinine, Ser: 1.32 mg/dL — ABNORMAL HIGH (ref 0.44–1.00)
GFR, Estimated: 39 mL/min — ABNORMAL LOW (ref >60)
Glucose, Bld: 93 mg/dL (ref 70–99)
Potassium: 3.7 mmol/L (ref 3.5–5.1)
Sodium: 138 mmol/L (ref 135–145)
Total Bilirubin: 0.6 mg/dL (ref 0.0–1.2)
Total Protein: 5.3 g/dL — ABNORMAL LOW (ref 6.5–8.1)

## 2023-03-29 LAB — MAGNESIUM: Magnesium: 1.8 mg/dL (ref 1.7–2.4)

## 2023-03-29 LAB — URIC ACID: Uric Acid, Serum: 6.3 mg/dL (ref 2.5–7.1)

## 2023-03-29 LAB — HEPATITIS B SURFACE ANTIGEN: Hepatitis B Surface Ag: NONREACTIVE

## 2023-03-29 MED ORDER — FAMOTIDINE IN NACL 20-0.9 MG/50ML-% IV SOLN
20.0000 mg | Freq: Once | INTRAVENOUS | Status: AC
  Administered 2023-03-29: 20 mg via INTRAVENOUS
  Filled 2023-03-29: qty 50

## 2023-03-29 MED ORDER — SODIUM CHLORIDE 0.9 % IV SOLN: INTRAVENOUS | Status: DC

## 2023-03-29 MED ORDER — CETIRIZINE HCL 10 MG/ML IV SOLN
10.0000 mg | Freq: Once | INTRAVENOUS | Status: AC
  Administered 2023-03-29: 10 mg via INTRAVENOUS
  Filled 2023-03-29: qty 1

## 2023-03-29 MED ORDER — SODIUM CHLORIDE 0.9 % IV SOLN
375.0000 mg/m2 | Freq: Once | INTRAVENOUS | Status: AC
  Administered 2023-03-29: 600 mg via INTRAVENOUS
  Filled 2023-03-29: qty 50

## 2023-03-29 MED ORDER — HEPARIN SOD (PORK) LOCK FLUSH 100 UNIT/ML IV SOLN
500.0000 [IU] | Freq: Once | INTRAVENOUS | Status: AC | PRN
  Administered 2023-03-29: 500 [IU]

## 2023-03-29 MED ORDER — DEXAMETHASONE SODIUM PHOSPHATE 10 MG/ML IJ SOLN
10.0000 mg | Freq: Once | INTRAMUSCULAR | Status: AC
  Administered 2023-03-29: 10 mg via INTRAVENOUS
  Filled 2023-03-29: qty 1

## 2023-03-29 MED ORDER — ACETAMINOPHEN 325 MG PO TABS
650.0000 mg | ORAL_TABLET | Freq: Once | ORAL | Status: AC
  Administered 2023-03-29: 650 mg via ORAL
  Filled 2023-03-29: qty 2

## 2023-03-29 MED ORDER — SODIUM CHLORIDE 0.9% FLUSH
10.0000 mL | INTRAVENOUS | Status: AC | PRN
  Administered 2023-03-29: 10 mL

## 2023-03-29 NOTE — Addendum Note (Signed)
Addended by: Pryor Ochoa E on: 03/29/2023 01:09 PM   Modules accepted: Orders

## 2023-03-29 NOTE — Progress Notes (Signed)
Hgb 7.7 for today's treatment  Patient is asymptomatic. Provider made aware for orders.

## 2023-03-29 NOTE — Patient Instructions (Signed)
 [facility] [place] - A DEPT [facility]  Discharge Instructions: Thank you for choosing [facility] to provide your oncology and hematology care.  If you have a lab appointment with the Cancer Center - please note that after April 8th, 2024, all labs will be drawn in the cancer center.  You do not have to check in or register with the main entrance as you have in the past but will complete your check-in in the cancer center.  Wear comfortable clothing and clothing appropriate for easy access to any Portacath or PICC line.   We strive to give you quality time with your provider. You may need to reschedule your appointment if you arrive late (15 or more minutes).  Arriving late affects you and other patients whose appointments are after yours.  Also, if you miss three or more appointments without notifying the office, you may be dismissed from the clinic at the provider's discretion.      For prescription refill requests, have your pharmacy contact our office and allow 72 hours for refills to be completed.    Today you received the following chemotherapy and/or immunotherapy agents rituxan.        To help prevent nausea and vomiting after your treatment, we encourage you to take your nausea medication as directed.  BELOW ARE SYMPTOMS THAT SHOULD BE REPORTED IMMEDIATELY: *FEVER GREATER THAN 100.4 F (38 C) OR HIGHER *CHILLS OR SWEATING *NAUSEA AND VOMITING THAT IS NOT CONTROLLED WITH YOUR NAUSEA MEDICATION *UNUSUAL SHORTNESS OF BREATH *UNUSUAL BRUISING OR BLEEDING *URINARY PROBLEMS (pain or burning when urinating, or frequent urination) *BOWEL PROBLEMS (unusual diarrhea, constipation, pain near the anus) TENDERNESS IN MOUTH AND THROAT WITH OR WITHOUT PRESENCE OF ULCERS (sore throat, sores in mouth, or a toothache) UNUSUAL RASH, SWELLING OR PAIN  UNUSUAL VAGINAL DISCHARGE OR ITCHING   Items with * indicate a potential emergency and should be followed  up as soon as possible or go to the Emergency Department if any problems should occur.  Please show the CHEMOTHERAPY ALERT CARD or IMMUNOTHERAPY ALERT CARD at check-in to the Emergency Department and triage nurse.  Should you have questions after your visit or need to cancel or reschedule your appointment, please contact [facility] CANCER CTR [place] - A DEPT OF [facility] [phone]  and follow the prompts.  Office hours are 8:00 a.m. to 4:30 p.m. Monday - Friday. Please note that voicemails left after 4:00 p.m. may not be returned until the following business day.  We are closed weekends and major holidays. You have access to a nurse at all times for urgent questions. Please call the main number to the clinic [phone] and follow the prompts.  For any non-urgent questions, you may also contact your provider using MyChart. We now offer e-Visits for anyone 73 and older to request care online for non-urgent symptoms. For details visit [email or web address].   Also download the MyChart app! Go to the app store, search "MyChart", open the app, select [place], and log in with your MyChart username and password.

## 2023-03-29 NOTE — Progress Notes (Signed)
Patient to receive one unit of blood on 03/30/2023 verbal order Dr. Ellin Saba.  Patient aware and blood consent signed.

## 2023-03-29 NOTE — Addendum Note (Signed)
Addended by: Toniann Fail B on: 03/29/2023 08:59 AM   Modules accepted: Orders

## 2023-03-29 NOTE — Progress Notes (Signed)
 Patient tolerated therapy with no complaints voiced.  Side effects with management reviewed with understanding verbalized.  Port site clean and dry with no bruising or swelling noted at site.  Good blood return noted before and after administration of chemotherapy.  Port dressing intact. Patient left in satisfactory condition with VSS and no s/s of distress noted.

## 2023-03-29 NOTE — Progress Notes (Signed)
 Pharmacist Chemotherapy Monitoring - Initial Assessment    Anticipated start date: 03/29/23    The following has been reviewed per standard work regarding the patient's treatment regimen: The patient's diagnosis, treatment plan and drug doses, and organ/hematologic function Lab orders and baseline tests specific to treatment regimen  The treatment plan start date, drug sequencing, and pre-medications Prior authorization status  Patient's documented medication list, including drug-drug interaction screen and prescriptions for anti-emetics and supportive care specific to the treatment regimen The drug concentrations, fluid compatibility, administration routes, and timing of the medications to be used The patient's access for treatment and lifetime cumulative dose history, if applicable  The patient's medication allergies and previous infusion related reactions, if applicable   Changes made to treatment plan:  N/A  Follow up needed:  N/A  May get CHOP on day 2   Leslie Williamson, [facility], 03/29/2023  9:00 AM

## 2023-03-30 ENCOUNTER — Ambulatory Visit: Payer: Medicare Other

## 2023-03-30 ENCOUNTER — Inpatient Hospital Stay: Payer: Medicare Other

## 2023-03-30 VITALS — BP 100/60 | HR 58 | Temp 97.1°F | Resp 18

## 2023-03-30 DIAGNOSIS — Z5111 Encounter for antineoplastic chemotherapy: Secondary | ICD-10-CM | POA: Diagnosis not present

## 2023-03-30 DIAGNOSIS — D509 Iron deficiency anemia, unspecified: Secondary | ICD-10-CM | POA: Diagnosis not present

## 2023-03-30 DIAGNOSIS — C83398 Diffuse large b-cell lymphoma of other extranodal and solid organ sites: Secondary | ICD-10-CM | POA: Diagnosis not present

## 2023-03-30 DIAGNOSIS — C9000 Multiple myeloma not having achieved remission: Secondary | ICD-10-CM | POA: Diagnosis not present

## 2023-03-30 DIAGNOSIS — Z7961 Long term (current) use of immunomodulator: Secondary | ICD-10-CM | POA: Diagnosis not present

## 2023-03-30 DIAGNOSIS — R918 Other nonspecific abnormal finding of lung field: Secondary | ICD-10-CM | POA: Diagnosis not present

## 2023-03-30 DIAGNOSIS — M7989 Other specified soft tissue disorders: Secondary | ICD-10-CM | POA: Diagnosis not present

## 2023-03-30 DIAGNOSIS — I739 Peripheral vascular disease, unspecified: Secondary | ICD-10-CM | POA: Diagnosis not present

## 2023-03-30 DIAGNOSIS — N189 Chronic kidney disease, unspecified: Secondary | ICD-10-CM | POA: Diagnosis not present

## 2023-03-30 DIAGNOSIS — Z9221 Personal history of antineoplastic chemotherapy: Secondary | ICD-10-CM | POA: Diagnosis not present

## 2023-03-30 DIAGNOSIS — D75839 Thrombocytosis, unspecified: Secondary | ICD-10-CM | POA: Diagnosis not present

## 2023-03-30 DIAGNOSIS — R350 Frequency of micturition: Secondary | ICD-10-CM | POA: Diagnosis not present

## 2023-03-30 DIAGNOSIS — E876 Hypokalemia: Secondary | ICD-10-CM | POA: Diagnosis not present

## 2023-03-30 DIAGNOSIS — I130 Hypertensive heart and chronic kidney disease with heart failure and stage 1 through stage 4 chronic kidney disease, or unspecified chronic kidney disease: Secondary | ICD-10-CM | POA: Diagnosis not present

## 2023-03-30 DIAGNOSIS — G629 Polyneuropathy, unspecified: Secondary | ICD-10-CM | POA: Diagnosis not present

## 2023-03-30 DIAGNOSIS — Z79624 Long term (current) use of inhibitors of nucleotide synthesis: Secondary | ICD-10-CM | POA: Diagnosis not present

## 2023-03-30 DIAGNOSIS — I2699 Other pulmonary embolism without acute cor pulmonale: Secondary | ICD-10-CM | POA: Diagnosis not present

## 2023-03-30 DIAGNOSIS — K5900 Constipation, unspecified: Secondary | ICD-10-CM | POA: Diagnosis not present

## 2023-03-30 DIAGNOSIS — Z5189 Encounter for other specified aftercare: Secondary | ICD-10-CM | POA: Diagnosis not present

## 2023-03-30 DIAGNOSIS — D631 Anemia in chronic kidney disease: Secondary | ICD-10-CM | POA: Diagnosis not present

## 2023-03-30 DIAGNOSIS — Z79899 Other long term (current) drug therapy: Secondary | ICD-10-CM | POA: Diagnosis not present

## 2023-03-30 DIAGNOSIS — Z7901 Long term (current) use of anticoagulants: Secondary | ICD-10-CM | POA: Diagnosis not present

## 2023-03-30 DIAGNOSIS — E7800 Pure hypercholesterolemia, unspecified: Secondary | ICD-10-CM | POA: Diagnosis not present

## 2023-03-30 DIAGNOSIS — Z7984 Long term (current) use of oral hypoglycemic drugs: Secondary | ICD-10-CM | POA: Diagnosis not present

## 2023-03-30 LAB — HEPATITIS B CORE ANTIBODY, TOTAL: HEP B CORE AB: NEGATIVE

## 2023-03-30 MED ORDER — SODIUM CHLORIDE 0.9 % IV SOLN
400.0000 mg/m2 | Freq: Once | INTRAVENOUS | Status: AC
  Administered 2023-03-30: 600 mg via INTRAVENOUS
  Filled 2023-03-30: qty 30

## 2023-03-30 MED ORDER — SODIUM CHLORIDE 0.9% FLUSH
10.0000 mL | INTRAVENOUS | Status: DC | PRN
  Administered 2023-03-30: 10 mL

## 2023-03-30 MED ORDER — DIPHENHYDRAMINE HCL 25 MG PO CAPS
25.0000 mg | ORAL_CAPSULE | Freq: Once | ORAL | Status: AC
  Administered 2023-03-30: 25 mg via ORAL
  Filled 2023-03-30: qty 1

## 2023-03-30 MED ORDER — DOXORUBICIN HCL CHEMO IV INJECTION 2 MG/ML
25.0000 mg/m2 | Freq: Once | INTRAVENOUS | Status: AC
  Administered 2023-03-30: 38 mg via INTRAVENOUS
  Filled 2023-03-30: qty 19

## 2023-03-30 MED ORDER — VINCRISTINE SULFATE CHEMO INJECTION 1 MG/ML
1.0000 mg | Freq: Once | INTRAVENOUS | Status: AC
  Administered 2023-03-30: 1 mg via INTRAVENOUS
  Filled 2023-03-30: qty 1

## 2023-03-30 MED ORDER — SODIUM CHLORIDE 0.9 % IV SOLN: INTRAVENOUS | Status: DC

## 2023-03-30 MED ORDER — HEPARIN SOD (PORK) LOCK FLUSH 100 UNIT/ML IV SOLN
500.0000 [IU] | Freq: Once | INTRAVENOUS | Status: AC | PRN
  Administered 2023-03-30: 500 [IU]

## 2023-03-30 MED ORDER — DEXAMETHASONE SODIUM PHOSPHATE 10 MG/ML IJ SOLN
10.0000 mg | Freq: Once | INTRAMUSCULAR | Status: AC
  Administered 2023-03-30: 10 mg via INTRAVENOUS
  Filled 2023-03-30: qty 1

## 2023-03-30 MED ORDER — PALONOSETRON HCL INJECTION 0.25 MG/5ML
0.2500 mg | Freq: Once | INTRAVENOUS | Status: AC
  Administered 2023-03-30: 0.25 mg via INTRAVENOUS
  Filled 2023-03-30: qty 5

## 2023-03-30 MED ORDER — SODIUM CHLORIDE 0.9% IV SOLUTION
250.0000 mL | INTRAVENOUS | Status: DC
  Administered 2023-03-30: 100 mL via INTRAVENOUS

## 2023-03-30 MED ORDER — ACETAMINOPHEN 325 MG PO TABS
650.0000 mg | ORAL_TABLET | Freq: Once | ORAL | Status: AC
  Administered 2023-03-30: 650 mg via ORAL
  Filled 2023-03-30: qty 2

## 2023-03-30 MED ORDER — SODIUM CHLORIDE 0.9 % IV SOLN
150.0000 mg | Freq: Once | INTRAVENOUS | Status: AC
  Administered 2023-03-30: 150 mg via INTRAVENOUS
  Filled 2023-03-30: qty 150

## 2023-03-30 NOTE — Progress Notes (Signed)
 Patient unable to complete R-CHOP in 1 day.  CHOP will be given on 03/30/23  Treatment plan updated to reflect change.  Augie Bliss, PharmD

## 2023-03-30 NOTE — Patient Instructions (Signed)
 [facility] [place] - A DEPT OF [facility]  Discharge Instructions: Thank you for choosing [facility] to provide your oncology and hematology care.  If you have a lab appointment with the Cancer Center - please note that after April 8th, 2024, all labs will be drawn in the cancer center.  You do not have to check in or register with the main entrance as you have in the past but will complete your check-in in the cancer center.  Wear comfortable clothing and clothing appropriate for easy access to any Portacath or PICC line.   We strive to give you quality time with your provider. You may need to reschedule your appointment if you arrive late (15 or more minutes).  Arriving late affects you and other patients whose appointments are after yours.  Also, if you miss three or more appointments without notifying the office, you may be dismissed from the clinic at the provider's discretion.      For prescription refill requests, have your pharmacy contact our office and allow 72 hours for refills to be completed.    Today you received the following chemotherapy and/or immunotherapy agents adriamycin , vincristine , cytoxan .       To help prevent nausea and vomiting after your treatment, we encourage you to take your nausea medication as directed.  BELOW ARE SYMPTOMS THAT SHOULD BE REPORTED IMMEDIATELY: *FEVER GREATER THAN 100.4 F (38 C) OR HIGHER *CHILLS OR SWEATING *NAUSEA AND VOMITING THAT IS NOT CONTROLLED WITH YOUR NAUSEA MEDICATION *UNUSUAL SHORTNESS OF BREATH *UNUSUAL BRUISING OR BLEEDING *URINARY PROBLEMS (pain or burning when urinating, or frequent urination) *BOWEL PROBLEMS (unusual diarrhea, constipation, pain near the anus) TENDERNESS IN MOUTH AND THROAT WITH OR WITHOUT PRESENCE OF ULCERS (sore throat, sores in mouth, or a toothache) UNUSUAL RASH, SWELLING OR PAIN  UNUSUAL VAGINAL DISCHARGE OR ITCHING   Items with * indicate a potential emergency  and should be followed up as soon as possible or go to the Emergency Department if any problems should occur.  Please show the CHEMOTHERAPY ALERT CARD or IMMUNOTHERAPY ALERT CARD at check-in to the Emergency Department and triage nurse.  Should you have questions after your visit or need to cancel or reschedule your appointment, please contact [facility] CANCER CTR [place] - A DEPT OF [facility] [phone]  and follow the prompts.  Office hours are 8:00 a.m. to 4:30 p.m. Monday - Friday. Please note that voicemails left after 4:00 p.m. may not be returned until the following business day.  We are closed weekends and major holidays. You have access to a nurse at all times for urgent questions. Please call the main number to the clinic [phone] and follow the prompts.  For any non-urgent questions, you may also contact your provider using MyChart. We now offer e-Visits for anyone 47 and older to request care online for non-urgent symptoms. For details visit [email or web address].   Also download the MyChart app! Go to the app store, search MyChart, open the app, select [place], and log in with your MyChart username and password.

## 2023-03-30 NOTE — Progress Notes (Signed)
 Patient tolerated chemotherapy with no complaints voiced.  Side effects with management reviewed with understanding verbalized.  Port site clean and dry with no bruising or swelling noted at site.  Good blood return noted before and after administration of chemotherapy.  Band aid applied.  Patient left in satisfactory condition with VSS and no s/s of distress noted.   Patient tolerated transfusion with no complaints voiced.  Side effects with management reviewed with understanding verbalized.  Port site clean and dry with no bruising or swelling noted at site.  Good blood return noted before and after administration.  Band aid applied.  Patient left in satisfactory condition with VSS and no s/s of distress noted.

## 2023-03-31 ENCOUNTER — Inpatient Hospital Stay: Payer: Medicare Other

## 2023-03-31 VITALS — BP 110/57 | HR 54 | Resp 16

## 2023-03-31 DIAGNOSIS — D631 Anemia in chronic kidney disease: Secondary | ICD-10-CM | POA: Diagnosis not present

## 2023-03-31 DIAGNOSIS — D509 Iron deficiency anemia, unspecified: Secondary | ICD-10-CM | POA: Diagnosis not present

## 2023-03-31 DIAGNOSIS — R918 Other nonspecific abnormal finding of lung field: Secondary | ICD-10-CM | POA: Diagnosis not present

## 2023-03-31 DIAGNOSIS — E7800 Pure hypercholesterolemia, unspecified: Secondary | ICD-10-CM | POA: Diagnosis not present

## 2023-03-31 DIAGNOSIS — G629 Polyneuropathy, unspecified: Secondary | ICD-10-CM | POA: Diagnosis not present

## 2023-03-31 DIAGNOSIS — Z79899 Other long term (current) drug therapy: Secondary | ICD-10-CM | POA: Diagnosis not present

## 2023-03-31 DIAGNOSIS — K5900 Constipation, unspecified: Secondary | ICD-10-CM | POA: Diagnosis not present

## 2023-03-31 DIAGNOSIS — I2699 Other pulmonary embolism without acute cor pulmonale: Secondary | ICD-10-CM | POA: Diagnosis not present

## 2023-03-31 DIAGNOSIS — Z79624 Long term (current) use of inhibitors of nucleotide synthesis: Secondary | ICD-10-CM | POA: Diagnosis not present

## 2023-03-31 DIAGNOSIS — I739 Peripheral vascular disease, unspecified: Secondary | ICD-10-CM | POA: Diagnosis not present

## 2023-03-31 DIAGNOSIS — D75839 Thrombocytosis, unspecified: Secondary | ICD-10-CM | POA: Diagnosis not present

## 2023-03-31 DIAGNOSIS — Z5111 Encounter for antineoplastic chemotherapy: Secondary | ICD-10-CM | POA: Diagnosis not present

## 2023-03-31 DIAGNOSIS — Z5189 Encounter for other specified aftercare: Secondary | ICD-10-CM | POA: Diagnosis not present

## 2023-03-31 DIAGNOSIS — M7989 Other specified soft tissue disorders: Secondary | ICD-10-CM | POA: Diagnosis not present

## 2023-03-31 DIAGNOSIS — N189 Chronic kidney disease, unspecified: Secondary | ICD-10-CM | POA: Diagnosis not present

## 2023-03-31 DIAGNOSIS — C9000 Multiple myeloma not having achieved remission: Secondary | ICD-10-CM | POA: Diagnosis not present

## 2023-03-31 DIAGNOSIS — C83398 Diffuse large b-cell lymphoma of other extranodal and solid organ sites: Secondary | ICD-10-CM | POA: Diagnosis not present

## 2023-03-31 DIAGNOSIS — Z7901 Long term (current) use of anticoagulants: Secondary | ICD-10-CM | POA: Diagnosis not present

## 2023-03-31 DIAGNOSIS — I130 Hypertensive heart and chronic kidney disease with heart failure and stage 1 through stage 4 chronic kidney disease, or unspecified chronic kidney disease: Secondary | ICD-10-CM | POA: Diagnosis not present

## 2023-03-31 DIAGNOSIS — E876 Hypokalemia: Secondary | ICD-10-CM | POA: Diagnosis not present

## 2023-03-31 DIAGNOSIS — Z9221 Personal history of antineoplastic chemotherapy: Secondary | ICD-10-CM | POA: Diagnosis not present

## 2023-03-31 DIAGNOSIS — Z7961 Long term (current) use of immunomodulator: Secondary | ICD-10-CM | POA: Diagnosis not present

## 2023-03-31 DIAGNOSIS — Z7984 Long term (current) use of oral hypoglycemic drugs: Secondary | ICD-10-CM | POA: Diagnosis not present

## 2023-03-31 DIAGNOSIS — R350 Frequency of micturition: Secondary | ICD-10-CM | POA: Diagnosis not present

## 2023-03-31 MED ORDER — PEGFILGRASTIM-CBQV 6 MG/0.6ML ~~LOC~~ SOSY
6.0000 mg | PREFILLED_SYRINGE | Freq: Once | SUBCUTANEOUS | Status: AC
  Administered 2023-03-31: 6 mg via SUBCUTANEOUS
  Filled 2023-03-31: qty 0.6

## 2023-03-31 NOTE — Progress Notes (Signed)
 Patient tolerated injection with no complaints voiced.  Site clean and dry with no bruising or swelling noted at site.  See MAR for details.  Band aid applied.  Patient stable during and after injection.  Vss with discharge and left in satisfactory condition with no s/s of distress noted.

## 2023-03-31 NOTE — Patient Instructions (Signed)
 [facility] [place] - [facility]  Discharge Instructions: Thank you for choosing [facility] to provide your oncology and hematology care.  If you have a lab appointment with the Cancer Center - please note that after April 8th, 2024, all labs will be drawn in the cancer center.  You do not have to check in or register with the main entrance as you have in the past but will complete your check-in in the cancer center.  Wear comfortable clothing and clothing appropriate for easy access to any Portacath or PICC line.   We strive to give you quality time with your provider. You may need to reschedule your appointment if you arrive late (15 or more minutes).  Arriving late affects you and other patients whose appointments are after yours.  Also, if you miss three or more appointments without notifying the office, you may be dismissed from the clinic at the provider's discretion.      For prescription refill requests, have your pharmacy contact our office and allow 72 hours for refills to be completed.    Today you received the following chemotherapy and/or immunotherapy agents udenyca      To help prevent nausea and vomiting after your treatment, we encourage you to take your nausea medication as directed.  BELOW ARE SYMPTOMS THAT SHOULD BE REPORTED IMMEDIATELY: *FEVER GREATER THAN 100.4 F (38 C) OR HIGHER *CHILLS OR SWEATING *NAUSEA AND VOMITING THAT IS NOT CONTROLLED WITH YOUR NAUSEA MEDICATION *UNUSUAL SHORTNESS OF BREATH *UNUSUAL BRUISING OR BLEEDING *URINARY PROBLEMS (pain or burning when urinating, or frequent urination) *BOWEL PROBLEMS (unusual diarrhea, constipation, pain near the anus) TENDERNESS IN MOUTH AND THROAT WITH OR WITHOUT PRESENCE OF ULCERS (sore throat, sores in mouth, or a toothache) UNUSUAL RASH, SWELLING OR PAIN  UNUSUAL VAGINAL DISCHARGE OR ITCHING   Items with * indicate a potential emergency and should be followed up  as soon as possible or go to the Emergency Department if any problems should occur.  Please show the CHEMOTHERAPY ALERT CARD or IMMUNOTHERAPY ALERT CARD at check-in to the Emergency Department and triage nurse.  Should you have questions after your visit or need to cancel or reschedule your appointment, please contact [facility] CANCER CTR [place] - A DEPT OF [facility] [phone]  and follow the prompts.  Office hours are 8:00 a.m. to 4:30 p.m. Monday - Friday. Please note that voicemails left after 4:00 p.m. may not be returned until the following business day.  We are closed weekends and major holidays. You have access to a nurse at all times for urgent questions. Please call the main number to the clinic [phone] and follow the prompts.  For any non-urgent questions, you may also contact your provider using MyChart. We now offer e-Visits for anyone 23 and older to request care online for non-urgent symptoms. For details visit [email or web address].   Also download the MyChart app! Go to the app store, search "MyChart", open the app, select [place], and log in with your MyChart username and password.

## 2023-03-31 NOTE — Progress Notes (Signed)
 24 HOUR CHEMOTHERAPY CALL BACK:  Patient is doing well with no complaints voiced.  Advised to contact our office with any questions or concerns.

## 2023-04-02 LAB — BPAM RBC
Blood Product Expiration Date: 202503112359
Blood Product Expiration Date: 202503112359
ISSUE DATE / TIME: 202502051048
ISSUE DATE / TIME: 202502051048
Unit Type and Rh: 5100
Unit Type and Rh: 5100

## 2023-04-02 LAB — TYPE AND SCREEN
ABO/RH(D): O POS
Antibody Screen: POSITIVE
Donor AG Type: NEGATIVE
Donor AG Type: NEGATIVE
Unit division: 0
Unit division: 0

## 2023-04-04 NOTE — Progress Notes (Addendum)
[facility] [address], [place] [address]    Clinic Day:  04/05/2023  Referring physician: Benetta Spar*  Patient Care Team: Benetta Spar, MD as PCP - General (Internal Medicine) Wyline Mood Dorothe Pea, MD as PCP - Cardiology (Cardiology) Doreatha Massed, MD as Medical Oncologist (Medical Oncology)   ASSESSMENT & PLAN:   Assessment: 1.  Biclonal IgG lambda and IgM kappa multiple myeloma, high risk: - BMBX (11/02/2021): IgG lambda plasma cells 70% - Myeloma FISH panel: Monosomy 13 (standard GIST), duplication of 1 q. (high risk), gain of 11 q. (standard risk). - Cytogenetics: Hyperdiploid E weight gain (trisomy/trisomy) of odd-numbered chromosomes and monosomy 13, standard risk.  Gain of 1 q. associated with progressive course. - PET scan (11/05/2021): No evidence of multiple myeloma.  No hypermetabolic adenopathy. - Daratumumab, Revlimid and dexamethasone cycle 1 started on 11/30/2021   2.  JAK2 positive essential thrombocytosis: - BMBX (11/02/2021): No evidence of fibrosis.  No morphological evidence of JAK2 positive ET.   3.  History of marginal zone lymphoma of the spleen: - Diagnosed in 1999, status post CHOP x7 cycles followed by rituximab 1 cycle. - Status post splenectomy.  No evidence of lymphoma on current PET scan.   4.  Social/family history: - She lives with her sister.  Son lives 30 minutes away.  She is independent of ADLs and some IADLs.  She drives.  She worked at [employer] and denies any exposure to chemicals or pesticides.  No family history of malignancies.  5.  Bone protection (DEXA scan 02/17/2022 T-score -0.9): - She saw dentist who recommended dental extractions.  She does not want zoledronic acid or denosumab.  6.  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 - Initial needle biopsy on  01/19/2023 showed malignant hematopoietic neoplasm. - Excision biopsy on 02/18/2023: Diffuse large B-cell lymphoma.    Plan: 1.  IgG lambda multiple myeloma: - Revlimid and Darzalex are on hold.  Last myeloma labs on 03/02/2023 with M spike 0.1 g and FLC ratio 2.72.  Will resume myeloma therapy after completion of chemotherapy.   2.  Anemia from CKD and iron deficiency: - Last Feraheme on 09/24/2022.  Last PRBC on 03/28/2022 for hemoglobin 7.7.  Hemoglobin today is 8.4.   3.  Bilateral leg swellings: - Will start her on Lasix 20 mg daily in the mornings.   4.  Pulmonary embolism (Dx 07/03/2022):: - Continue Eliquis twice daily indefinitely.  No bleeding issues.   5.  Hypokalemia: - Continue potassium 40 mill equivalents daily.  Potassium is normal today.  6.  Diffuse large B-cell lymphoma of leg type: - She received cycle 1 of R mini CHOP on 03/30/2023. - She has not taken prednisone 60 mg daily x 4 days. - She reported constipation which improved with Colace.  She felt cold few days.  She also reported increased urinary frequency.  When asked how many times, she told me that she goes 4 times during the daytime.  She cannot provide urine sample today.  She denies any dysuria.  She was told to call us should she develop dysuria to check UA. - Labs today: Normal LFTs and electrolytes.  CBC with normal white count and platelet count.  Hemoglobin is 8.4. - She has tolerated cycle 1 very well so far.  RTC 2 weeks for cycle 2.  Orders Placed This Encounter  Procedures   Urine culture    Standing Status:   Future    Expected Date:   04/19/2023    Expiration Date:   04/04/2024   Urinalysis, Routine w reflex microscopic    Standing Status:   Future    Expected Date:   04/19/2023    Expiration Date:   04/04/2024     Alben Deeds Teague,acting as a scribe for Doreatha Massed, MD.,have documented all relevant documentation on the behalf of Doreatha Massed, MD,as directed by  Doreatha Massed, MD while in the presence of Doreatha Massed, MD.  I, Doreatha Massed MD, have reviewed the above documentation for accuracy and completeness, and I agree with the above.     Doreatha Massed, MD   2/11/20253:33 PM  CHIEF COMPLAINT:   Diagnosis: multiple myeloma    Cancer Staging  Multiple myeloma (HCC) Staging form: Plasma Cell Myeloma and Plasma Cell Disorders, AJCC 8th Edition - Clinical stage from 11/16/2021: Albumin (g/dL): 3.1, ISS: Stage II, High-risk cytogenetics: Absent, LDH: Normal - Unsigned  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) Staging form: Primary Cutaneous B-Cell/T-Cell Lymphoma (Non-MF/SS Lymphoma), AJCC 8th Edition - Clinical stage from 03/01/2023: cT1a, cN0, cM0 - Unsigned    Prior Therapy: none  Current Therapy:  Daratumumab, Revlimid and dexamethasone    HISTORY OF PRESENT ILLNESS:   Oncology History  NHL (non-Hodgkin's lymphoma) (HCC)  07/19/1995 Pathology Results   Spleen biopsy with resection- low-grade B-cell lymphoma, splenic marginal zone type   02/20/1998 Imaging   CT CAP-marked and extensive cervical adenopathy   02/25/1998 Pathology Results   Right cervical lymph node demonstrating large B-cell lymphoma   03/05/1998 Bone Marrow Biopsy   No evidence of bone marrow involvement   03/11/1998 - 07/18/1998 Chemotherapy   CHOP x 7 cycles   05/09/1998 Imaging   CT CAP and neck- slight interval decrease in size of para-aortic adenopathy with interval decrease in right inguinal and bi-iliac adenopathy.  Interval decrease in size and number of enlarged cervical nodes   07/10/1998 Imaging   Ct CAP and neck- unremarkable CT of chest. No enlarged retroperitoneal adenopathy withthe previously noted retroperitoneal nodes currently smaller to stable in size.  Cervical adenopathy is stable to slightly smaller in size.   08/18/1998 - 09/08/1998 Chemotherapy   Rituxan Day 1, 8, 15, 22 x 1 cycle    01/08/1999 Remission   CT CAP and neck demonstrates no adenopathy or disease   01/08/1999 Imaging   CT CAP and neck- Stable CT of chest. Stable CT abd/pelvis without adenopathy.  Negative CT of neck   Multiple myeloma (HCC)  11/16/2021 Initial Diagnosis   Multiple myeloma (HCC)   11/30/2021 - 03/02/2023 Chemotherapy   Patient is on Treatment Plan : R-CHOP 03/29/23 - MYELOMA  Daratumumab SQ + Lenalidomide + Dexamethasone (DaraRd) q28d     03/29/2023 -  Chemotherapy   Patient is on Treatment Plan : NON-HODGKINS LYMPHOMA R-CHOP q21d x 4 cycles        INTERVAL HISTORY:   Leslie Williamson is a 89 y.o. female seen for follow-up of multiple myeloma.  She was last seen by me on 03/16/23.  Today, she states that she is doing well overall. Her appetite level is at 100%. Her energy level is at 60-70%. She is accompanied by a family member.  She has discontinued Revlimid as prescribed. She reports side effects that lasted a few days after treatment of constipation, treated effectively with Colace, and  feeling cold. Her last BM was yesterday. She denies any nausea or vomiting. Zetha has gained 4 pounds since her last visit, her family member attributes to fluid retention. She states feeling inebriated for 3 days after treatment and had difficulty sleeping. Leslie Williamson believes one of her medications is causing her to lose fluid. She is not taking lasix 40 mg. She denies any fatigue or breathing issues. She notes an improved appetite. She has not taken prednisone with her first treatment.  She notes urinary frequency, worsened at night, that makes it difficult to sleep. She denies any dysuria. She urinates 4 times during the day. Leslie Williamson states she did not have nocturia last night, but has otherwise been nightly.   PAST MEDICAL HISTORY:   Past Medical History: Past Medical History:  Diagnosis Date   Anxiety    Aortic atherosclerosis (HCC)    Complication of anesthesia    difficult to wake up   Depression     Heart failure with mildly reduced ejection fraction (HFmrEF) (HCC)    Hypercholesteremia    Hypertension    Hypokalemia    Iron deficiency anemia    Left hip pain    NHL (non-Hodgkin's lymphoma) (HCC) 12/17/2010   Obesity    Primary thrombocytosis (HCC) 01/11/2006   Secondary to splenectomy     PVD (peripheral vascular disease) (HCC)    S/P splenectomy 01/01/2014   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)    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   Vertigo, labyrinthine     Surgical History: Past Surgical History:  Procedure Laterality Date   CATARACT EXTRACTION W/PHACO Left 11/03/2015   Procedure: CATARACT EXTRACTION PHACO AND INTRAOCULAR LENS PLACEMENT (IOC);  Surgeon: Gemma Payor, MD;  Location: [facility];  Service: Ophthalmology;  Laterality: Left;  CDE: 7.74   CATARACT EXTRACTION W/PHACO Right 11/20/2015   Procedure: CATARACT EXTRACTION PHACO AND INTRAOCULAR LENS PLACEMENT ; CDE:  8.30;  Surgeon: Gemma Payor, MD;  Location: [facility];  Service: Ophthalmology;  Laterality: Right;   IR IMAGING GUIDED PORT INSERTION  11/04/2021   MASS EXCISION Right 02/18/2023   Procedure: Incisional Biopsy Right Lower Extremity;  Surgeon: Lucretia Roers, MD;  Location: [facility];  Service: General;  Laterality: Right;   PORT-A-CATH REMOVAL      Social History: Social History   Socioeconomic History   Marital status: Widowed    Spouse name: Not on file   Number of children: Not on file   Years of education: Not on file   Highest education level: Not on file  Occupational History   Not on file  Tobacco Use   Smoking status: Former   Smokeless tobacco: Never  Vaping Use   Vaping status: Never Used  Substance and Sexual Activity   Alcohol use: No   Drug use: No   Sexual activity: Not Currently  Other Topics Concern   Not on file  Social History Narrative   Not on file   Social Drivers of Health   Financial Resource Strain: Low Risk  (12/31/2019)   Overall  Financial Resource Strain (CARDIA)    Difficulty of Paying Living Expenses: Not hard at all  Food Insecurity: No Food Insecurity (07/03/2022)   Hunger Vital Sign    Worried About Running Out of Food in the Last Year: Never true    Ran Out of Food in the Last Year: Never true  Transportation Needs: No Transportation Needs (07/03/2022)   PRAPARE - Transportation    Lack  of Transportation (Medical): No    Lack of Transportation (Non-Medical): No  Physical Activity: Inactive (12/31/2019)   Exercise Vital Sign    Days of Exercise per Week: 0 days    Minutes of Exercise per Session: 0 min  Stress: No Stress Concern Present (12/31/2019)   [employer] of Occupational Health - Occupational Stress Questionnaire    Feeling of Stress : Not at all  Social Connections: Moderately Isolated (12/31/2019)   Social Connection and Isolation Panel [NHANES]    Frequency of Communication with Friends and Family: More than three times a week    Frequency of Social Gatherings with Friends and Family: More than three times a week    Attends Religious Services: More than 4 times per year    Active Member of [employer] or Organizations: No    Attends Banker Meetings: Never    Marital Status: Widowed  Intimate Partner Violence: Not At Risk (07/03/2022)   Humiliation, Afraid, Rape, and Kick questionnaire    Fear of Current or Ex-Partner: No    Emotionally Abused: No    Physically Abused: No    Sexually Abused: No    Family History: Family History  Problem Relation Age of Onset   Diabetes Mother     Current Medications:  Current Outpatient Medications:    acetaminophen (TYLENOL) 500 MG tablet, Take 1,000 mg by mouth every 6 (six) hours as needed for moderate pain., Disp: , Rfl:    acyclovir (ZOVIRAX) 400 MG tablet, Take 1 tablet (400 mg total) by mouth 2 (two) times daily., Disp: 60 tablet, Rfl: 6   allopurinol (ZYLOPRIM) 300 MG tablet, Take 1 tablet (300 mg total) by mouth daily., Disp: 30  tablet, Rfl: 0   apixaban (ELIQUIS) 5 MG TABS tablet, Take 1 tablet (5 mg total) by mouth 2 (two) times daily., Disp: 60 tablet, Rfl: 1   atorvastatin (LIPITOR) 40 MG tablet, Take 40 mg by mouth at bedtime., Disp: , Rfl:    CYCLOPHOSPHAMIDE IV, Inject into the vein every 21 ( twenty-one) days., Disp: , Rfl:    dapagliflozin propanediol (FARXIGA) 10 MG TABS tablet, Take 1 tablet (10 mg total) by mouth daily., Disp: 30 tablet, Rfl: 1   dexamethasone (DECADRON) 4 MG tablet, Take 10 mg (2.5 tablets) by mouth weekly every Wednesday morning Per note by Dr. Ellin Saba on 09/15/2022, Disp: 20 tablet, Rfl: 6   DOXOrubicin HCl (ADRIAMYCIN IV), Inject into the vein every 21 ( twenty-one) days., Disp: , Rfl:    furosemide (LASIX) 20 MG tablet, Take 1 tablet (20 mg total) by mouth every morning., Disp: 30 tablet, Rfl: 3   lenalidomide (REVLIMID) 10 MG capsule, Take 1 capsule (10 mg total) by mouth daily. Take for 21 days on, 7 days off., Disp: 21 capsule, Rfl: 0   lidocaine-prilocaine (EMLA) cream, Apply 1 Application topically as needed., Disp: 30 g, Rfl: 1   lidocaine-prilocaine (EMLA) cream, Apply to affected area once, Disp: 30 g, Rfl: 3   losartan (COZAAR) 25 MG tablet, Take 12.5 mg by mouth daily., Disp: , Rfl:    metoprolol succinate (TOPROL-XL) 25 MG 24 hr tablet, Take 0.5 tablets (12.5 mg total) by mouth daily., Disp: 30 tablet, Rfl: 0   ondansetron (ZOFRAN) 4 MG tablet, Take 1 tablet (4 mg total) by mouth every 8 (eight) hours as needed., Disp: 30 tablet, Rfl: 1   oxyCODONE (ROXICODONE) 5 MG immediate release tablet, Take 1 tablet (5 mg total) by mouth every 4 (four) hours as needed  for severe pain (pain score 7-10) or breakthrough pain., Disp: 5 tablet, Rfl: 0   potassium chloride SA (KLOR-CON M20) 20 MEQ tablet, Take 1 tablet on the days you take lasix (Patient taking differently: Take 20 mEq by mouth 2 (two) times daily.), Disp: 180 tablet, Rfl: 3   predniSONE (DELTASONE) 20 MG tablet, Take 3 tablets  (60 mg total) by mouth daily. Take with food on days 1-5 of chemotherapy., Disp: 25 tablet, Rfl: 5   prochlorperazine (COMPAZINE) 10 MG tablet, Take 1 tablet (10 mg total) by mouth every 6 (six) hours as needed for nausea or vomiting., Disp: 30 tablet, Rfl: 6   riTUXimab (RITUXAN IV), Inject into the vein every 21 ( twenty-one) days., Disp: , Rfl:    VINCRISTINE SULFATE IV, Inject into the vein every 21 ( twenty-one) days., Disp: , Rfl:  No current facility-administered medications for this visit.  Facility-Administered Medications Ordered in Other Visits:    0.9 %  sodium chloride infusion, , Intravenous, Continuous, Doreatha Massed, MD, Stopped at 03/29/23 1333   sodium chloride flush (NS) 0.9 % injection 10 mL, 10 mL, Intracatheter, PRN, Doreatha Massed, MD, 10 mL at 03/29/23 1336   sodium chloride flush (NS) 0.9 % injection 10 mL, 10 mL, Intravenous, PRN, Doreatha Massed, MD, 10 mL at 04/05/23 1106   Allergies: Allergies  Allergen Reactions   Penicillins Rash    REVIEW OF SYSTEMS:   Review of Systems  Constitutional:  Negative for chills, fatigue and fever.  HENT:   Negative for lump/mass, mouth sores, nosebleeds, sore throat and trouble swallowing.   Eyes:  Negative for eye problems.  Respiratory:  Negative for cough and shortness of breath.   Cardiovascular:  Negative for chest pain, leg swelling and palpitations.  Gastrointestinal:  Positive for constipation. Negative for abdominal pain, diarrhea, nausea and vomiting.  Genitourinary:  Positive for nocturia. Negative for bladder incontinence, difficulty urinating, dysuria, frequency and hematuria.   Musculoskeletal:  Negative for arthralgias, back pain, flank pain, myalgias and neck pain.  Skin:  Negative for itching and rash.  Neurological:  Positive for dizziness. Negative for headaches and numbness.  Hematological:  Does not bruise/bleed easily.  Psychiatric/Behavioral:  Positive for sleep disturbance. Negative  for depression and suicidal ideas. The patient is not nervous/anxious.   All other systems reviewed and are negative.    VITALS:   Blood pressure (!) 134/48, pulse 64, temperature 98.1 F (36.7 C), temperature source Oral, resp. rate 16, weight 125 lb 14.1 oz (57.1 kg), SpO2 100%.  Wt Readings from Last 3 Encounters:  04/05/23 125 lb 14.1 oz (57.1 kg)  03/29/23 128 lb 12.8 oz (58.4 kg)  03/16/23 121 lb (54.9 kg)    Body mass index is 24.58 kg/m.  Performance status (ECOG): 1 - Symptomatic but completely ambulatory  PHYSICAL EXAM:   Physical Exam Vitals and nursing note reviewed. Exam conducted with a chaperone present.  Constitutional:      Appearance: Normal appearance.  Cardiovascular:     Rate and Rhythm: Normal rate and regular rhythm.     Pulses: Normal pulses.     Heart sounds: Normal heart sounds.  Pulmonary:     Effort: Pulmonary effort is normal.     Breath sounds: Normal breath sounds.  Abdominal:     Palpations: Abdomen is soft. There is no hepatomegaly, splenomegaly or mass.     Tenderness: There is no abdominal tenderness.  Musculoskeletal:     Right lower leg: No edema.  Left lower leg: No edema.  Lymphadenopathy:     Cervical: No cervical adenopathy.     Right cervical: No superficial, deep or posterior cervical adenopathy.    Left cervical: No superficial, deep or posterior cervical adenopathy.     Upper Body:     Right upper body: No supraclavicular or axillary adenopathy.     Left upper body: No supraclavicular or axillary adenopathy.  Skin:    Comments: +5-6 cm mass in medial right upper leg  Neurological:     General: No focal deficit present.     Mental Status: She is alert and oriented to person, place, and time.  Psychiatric:        Mood and Affect: Mood normal.        Behavior: Behavior normal.     LABS:      Latest Ref Rng & Units 04/05/2023   10:09 AM 03/29/2023    7:48 AM 03/02/2023   12:31 PM  CBC  WBC 4.0 - 10.5 K/uL 8.6  5.6   7.9   Hemoglobin 12.0 - 15.0 g/dL 8.4  7.7  9.6   Hematocrit 36.0 - 46.0 % 27.1  25.6  31.0   Platelets 150 - 400 K/uL 275  506  500       Latest Ref Rng & Units 04/05/2023   10:09 AM 03/29/2023    7:48 AM 03/16/2023    2:59 PM  CMP  Glucose 70 - 99 mg/dL 99  93  99   BUN 8 - 23 mg/dL 15  20  25    Creatinine 0.44 - 1.00 mg/dL 1.61  0.96  0.45   Sodium 135 - 145 mmol/L 140  138  137   Potassium 3.5 - 5.1 mmol/L 3.9  3.7  3.7   Chloride 98 - 111 mmol/L 111  108  107   CO2 22 - 32 mmol/L 23  21  21    Calcium 8.9 - 10.3 mg/dL 8.0  8.6  8.7   Total Protein 6.5 - 8.1 g/dL 4.9  5.3    Total Bilirubin 0.0 - 1.2 mg/dL 0.5  0.6    Alkaline Phos 38 - 126 U/L 101  66    AST 15 - 41 U/L 11  12    ALT 0 - 44 U/L 7  11       No results found for: "CEA1", "CEA" / No results found for: "CEA1", "CEA" No results found for: "PSA1" No results found for: "CAN199" No results found for: "CAN125"  Lab Results  Component Value Date   TOTALPROTELP 5.4 (L) 03/02/2023   ALBUMINELP 3.1 03/02/2023   A1GS 0.3 03/02/2023   A2GS 0.7 03/02/2023   BETS 1.0 03/02/2023   GAMS 0.4 03/02/2023   MSPIKE 0.1 (H) 03/02/2023   SPEI Comment 03/02/2023   Lab Results  Component Value Date   TIBC 364 03/16/2023   TIBC 375 09/15/2022   TIBC 249 (L) 03/10/2022   FERRITIN 6 (L) 03/16/2023   FERRITIN 7 (L) 09/15/2022   FERRITIN 93 03/10/2022   IRONPCTSAT 12 03/16/2023   IRONPCTSAT 5 (L) 09/15/2022   IRONPCTSAT 8 (L) 03/10/2022   Lab Results  Component Value Date   LDH 122 03/02/2023   LDH 154 09/30/2021   LDH 148 08/05/2021     STUDIES:   NM PET Image Restage (PS) Whole Body Result Date: 03/15/2023 CLINICAL DATA:  Initial treatment strategy for diffuse B-cell lymphoma. Lower extremity lymphoma EXAM: NUCLEAR MEDICINE PET WHOLE BODY TECHNIQUE: 6.4  mCi F-18 FDG was injected intravenously. Full-ring PET imaging was performed from the head to foot after the radiotracer. CT data was obtained and used for  attenuation correction and anatomic localization. Fasting blood glucose: 137 mg/dl COMPARISON:  None Available. FINDINGS: Mediastinal blood pool activity: SUV max 2.4 HEAD/NECK: Metabolic activity associated with the paraspinal musculature the skull base is favored benign physiologic. No suspicious FDG activity in the head or neck. Incidental CT findings: none CHEST: No hypermetabolic mediastinal lymph nodes. Suspicious pulmonary nodules. Port in the anterior chest wall with tip in distal SVC. Metabolic activity associated with the subscapularis muscle along the body the LEFT scapula with SUV max equal 4.2 on image 75. There is no mass lesion identified within the musculature. Activity is less than primary lesion within the RIGHT calf. Muscle activity in the forearms and hands is typical benign physiologic muscle activity. Incidental CT findings: none ABDOMEN/PELVIS: No abnormal hypermetabolic activity within the liver, pancreas, adrenal glands, or spleen. No hypermetabolic lymph nodes in the abdomen or pelvis. Incidental CT findings: Fluid collection along the RIGHT pericolic gutter measuring 4.5 by 2.8 cm (image 134) is simple fluid attenuation. No radiotracer activity. SKELETON: No focal hypermetabolic activity to suggest skeletal metastasis. Incidental CT findings: none EXTREMITIES: Intense radiotracer activity along the medial aspect of the RIGHT tibia with SUV max equal 7.0. Activity corresponds to subcutaneous/intramuscular mass measuring 4.2 by 2.6 cm on image 298. No additional lower extremity abnormal metabolic activity. Incidental CT findings: none IMPRESSION: 1. Intense radiotracer activity associated with subcutaneous/intramuscular mass along the medial aspect of the RIGHT tibia consistent with lymphoma. 2. Asymmetric hypermetabolic activity associated with the LEFT subscapularis muscle. Metabolic activity is less than primary lesion within the RIGHT calf. Favor benign physiologic activity. Consider MRI  with contrast for confirmation. 3. No evidence of lymphoma adenopathy.  Normal spleen. 4. Fluid collection along the RIGHT pericolic gutter is simple fluid attenuation. No radiotracer activity. Favor postoperative seroma. Electronically Signed   By: Genevive Bi M.D.   On: 03/15/2023 10:03

## 2023-04-05 ENCOUNTER — Inpatient Hospital Stay: Payer: Medicare Other

## 2023-04-05 ENCOUNTER — Inpatient Hospital Stay: Payer: Medicare Other | Admitting: Hematology

## 2023-04-05 VITALS — BP 114/49 | HR 80 | Temp 97.0°F | Resp 16

## 2023-04-05 VITALS — BP 134/48 | HR 64 | Temp 98.1°F | Resp 16 | Wt 125.9 lb

## 2023-04-05 DIAGNOSIS — Z9221 Personal history of antineoplastic chemotherapy: Secondary | ICD-10-CM | POA: Diagnosis not present

## 2023-04-05 DIAGNOSIS — R350 Frequency of micturition: Secondary | ICD-10-CM | POA: Diagnosis not present

## 2023-04-05 DIAGNOSIS — Z7961 Long term (current) use of immunomodulator: Secondary | ICD-10-CM | POA: Diagnosis not present

## 2023-04-05 DIAGNOSIS — G629 Polyneuropathy, unspecified: Secondary | ICD-10-CM | POA: Diagnosis not present

## 2023-04-05 DIAGNOSIS — C83398 Diffuse large b-cell lymphoma of other extranodal and solid organ sites: Secondary | ICD-10-CM | POA: Diagnosis not present

## 2023-04-05 DIAGNOSIS — C9000 Multiple myeloma not having achieved remission: Secondary | ICD-10-CM

## 2023-04-05 DIAGNOSIS — D631 Anemia in chronic kidney disease: Secondary | ICD-10-CM | POA: Diagnosis not present

## 2023-04-05 DIAGNOSIS — D509 Iron deficiency anemia, unspecified: Secondary | ICD-10-CM

## 2023-04-05 DIAGNOSIS — E876 Hypokalemia: Secondary | ICD-10-CM | POA: Diagnosis not present

## 2023-04-05 DIAGNOSIS — I2699 Other pulmonary embolism without acute cor pulmonale: Secondary | ICD-10-CM | POA: Diagnosis not present

## 2023-04-05 DIAGNOSIS — R300 Dysuria: Secondary | ICD-10-CM | POA: Diagnosis not present

## 2023-04-05 DIAGNOSIS — Z5111 Encounter for antineoplastic chemotherapy: Secondary | ICD-10-CM | POA: Diagnosis not present

## 2023-04-05 DIAGNOSIS — Z5189 Encounter for other specified aftercare: Secondary | ICD-10-CM | POA: Diagnosis not present

## 2023-04-05 DIAGNOSIS — R918 Other nonspecific abnormal finding of lung field: Secondary | ICD-10-CM | POA: Diagnosis not present

## 2023-04-05 DIAGNOSIS — Z79624 Long term (current) use of inhibitors of nucleotide synthesis: Secondary | ICD-10-CM | POA: Diagnosis not present

## 2023-04-05 DIAGNOSIS — Z79899 Other long term (current) drug therapy: Secondary | ICD-10-CM | POA: Diagnosis not present

## 2023-04-05 DIAGNOSIS — E7800 Pure hypercholesterolemia, unspecified: Secondary | ICD-10-CM | POA: Diagnosis not present

## 2023-04-05 DIAGNOSIS — K5900 Constipation, unspecified: Secondary | ICD-10-CM | POA: Diagnosis not present

## 2023-04-05 DIAGNOSIS — Z7984 Long term (current) use of oral hypoglycemic drugs: Secondary | ICD-10-CM | POA: Diagnosis not present

## 2023-04-05 DIAGNOSIS — Z7901 Long term (current) use of anticoagulants: Secondary | ICD-10-CM | POA: Diagnosis not present

## 2023-04-05 DIAGNOSIS — I130 Hypertensive heart and chronic kidney disease with heart failure and stage 1 through stage 4 chronic kidney disease, or unspecified chronic kidney disease: Secondary | ICD-10-CM | POA: Diagnosis not present

## 2023-04-05 DIAGNOSIS — I739 Peripheral vascular disease, unspecified: Secondary | ICD-10-CM | POA: Diagnosis not present

## 2023-04-05 DIAGNOSIS — D75839 Thrombocytosis, unspecified: Secondary | ICD-10-CM | POA: Diagnosis not present

## 2023-04-05 DIAGNOSIS — N189 Chronic kidney disease, unspecified: Secondary | ICD-10-CM | POA: Diagnosis not present

## 2023-04-05 DIAGNOSIS — M7989 Other specified soft tissue disorders: Secondary | ICD-10-CM | POA: Diagnosis not present

## 2023-04-05 LAB — COMPREHENSIVE METABOLIC PANEL
ALT: 7 U/L (ref 0–44)
AST: 11 U/L — ABNORMAL LOW (ref 15–41)
Albumin: 2.8 g/dL — ABNORMAL LOW (ref 3.5–5.0)
Alkaline Phosphatase: 101 U/L (ref 38–126)
Anion gap: 6 (ref 5–15)
BUN: 15 mg/dL (ref 8–23)
CO2: 23 mmol/L (ref 22–32)
Calcium: 8 mg/dL — ABNORMAL LOW (ref 8.9–10.3)
Chloride: 111 mmol/L (ref 98–111)
Creatinine, Ser: 0.91 mg/dL (ref 0.44–1.00)
GFR, Estimated: >60 mL/min (ref >60)
Glucose, Bld: 99 mg/dL (ref 70–99)
Potassium: 3.9 mmol/L (ref 3.5–5.1)
Sodium: 140 mmol/L (ref 135–145)
Total Bilirubin: 0.5 mg/dL (ref 0.0–1.2)
Total Protein: 4.9 g/dL — ABNORMAL LOW (ref 6.5–8.1)

## 2023-04-05 LAB — CBC WITH DIFFERENTIAL/PLATELET
Abs Immature Granulocytes: 0 10*3/uL (ref 0.00–0.07)
Band Neutrophils: 5 %
Basophils Absolute: 0 10*3/uL (ref 0.0–0.1)
Basophils Relative: 0 %
Eosinophils Absolute: 0 10*3/uL (ref 0.0–0.5)
Eosinophils Relative: 0 %
HCT: 27.1 % — ABNORMAL LOW (ref 36.0–46.0)
Hemoglobin: 8.4 g/dL — ABNORMAL LOW (ref 12.0–15.0)
Lymphocytes Relative: 22 %
Lymphs Abs: 1.9 10*3/uL (ref 0.7–4.0)
MCH: 25.9 pg — ABNORMAL LOW (ref 26.0–34.0)
MCHC: 31 g/dL (ref 30.0–36.0)
MCV: 83.6 fL (ref 80.0–100.0)
Monocytes Absolute: 0.3 10*3/uL (ref 0.1–1.0)
Monocytes Relative: 4 %
Neutro Abs: 6.4 10*3/uL (ref 1.7–7.7)
Neutrophils Relative %: 69 %
Platelets: 275 10*3/uL (ref 150–400)
RBC: 3.24 MIL/uL — ABNORMAL LOW (ref 3.87–5.11)
RDW: 20.4 % — ABNORMAL HIGH (ref 11.5–15.5)
Smear Review: ADEQUATE
WBC: 8.6 10*3/uL (ref 4.0–10.5)
nRBC: 0.5 % — ABNORMAL HIGH (ref 0.0–0.2)

## 2023-04-05 LAB — SAMPLE TO BLOOD BANK

## 2023-04-05 LAB — MAGNESIUM: Magnesium: 1.8 mg/dL (ref 1.7–2.4)

## 2023-04-05 MED ORDER — HEPARIN SOD (PORK) LOCK FLUSH 100 UNIT/ML IV SOLN
500.0000 [IU] | Freq: Once | INTRAVENOUS | Status: AC
  Administered 2023-04-05: 500 [IU] via INTRAVENOUS

## 2023-04-05 MED ORDER — SODIUM CHLORIDE 0.9% FLUSH
10.0000 mL | INTRAVENOUS | Status: DC | PRN
  Administered 2023-04-05: 10 mL via INTRAVENOUS

## 2023-04-05 MED ORDER — SODIUM CHLORIDE 0.9% FLUSH
10.0000 mL | Freq: Once | INTRAVENOUS | Status: AC
  Administered 2023-04-05: 10 mL via INTRAVENOUS

## 2023-04-05 MED ORDER — FUROSEMIDE 20 MG PO TABS: 20.0000 mg | ORAL_TABLET | Freq: Every morning | ORAL | 3 refills | Status: DC

## 2023-04-05 NOTE — Patient Instructions (Signed)
[facility] at [facility] Discharge Instructions   You were seen and examined today by Dr. Ellin Saba.  He reviewed the results of your lab work which are normal/stable.   We will see you back in 2 weeks for your next treatment.   Return as scheduled.    Thank you for choosing [facility] at [facility] to provide your oncology and hematology care.  To afford each patient quality time with our provider, please arrive at least 15 minutes before your scheduled appointment time.   If you have a lab appointment with the Cancer Center please come in thru the Main Entrance and check in at the main information desk.  You need to re-schedule your appointment should you arrive 10 or more minutes late.  We strive to give you quality time with our providers, and arriving late affects you and other patients whose appointments are after yours.  Also, if you no show three or more times for appointments you may be dismissed from the clinic at the providers discretion.     Again, thank you for choosing [facility].  Our hope is that these requests will decrease the amount of time that you wait before being seen by our physicians.       _____________________________________________________________  Should you have questions after your visit to [facility], please contact our office at [phone] and follow the prompts.  Our office hours are 8:00 a.m. and 4:30 p.m. Monday - Friday.  Please note that voicemails left after 4:00 p.m. may not be returned until the following business day.  We are closed weekends and major holidays.  You do have access to a nurse 24-7, just call the main number to the clinic [phone] and do not press any options, hold on the line and a nurse will answer the phone.    For prescription refill requests, have your pharmacy contact our office and allow 72 hours.    Due to Covid, you will need to wear a mask  upon entering the hospital. If you do not have a mask, a mask will be given to you at the Main Entrance upon arrival. For doctor visits, patients may have 1 support person age 65 or older with them. For treatment visits, patients can not have anyone with them due to social distancing guidelines and our immunocompromised population.

## 2023-04-05 NOTE — Progress Notes (Signed)
Patient presents today for possible fluids per provider's order. No fluids needed per Dr.Katragadda.  Discharged from clinic via wheelchair in stable condition. Alert and oriented x 3. F/U with [facility] as scheduled.

## 2023-04-06 ENCOUNTER — Other Ambulatory Visit: Payer: Self-pay

## 2023-04-18 NOTE — Progress Notes (Signed)
 [facility] [address] [place] [address]    Clinic Day:  04/19/2023  Referring physician: Benetta Spar*  Patient Care Team: Benetta Spar, MD as PCP - General (Internal Medicine) Wyline Mood Dorothe Pea, MD as PCP - Cardiology (Cardiology) Doreatha Massed, MD as Medical Oncologist (Medical Oncology)   ASSESSMENT & PLAN:   Assessment: 1.  Biclonal IgG lambda and IgM kappa multiple myeloma, high risk: - BMBX (11/02/2021): IgG lambda plasma cells 70% - Myeloma FISH panel: Monosomy 13 (standard GIST), duplication of 1 q. (high risk), gain of 11 q. (standard risk). - Cytogenetics: Hyperdiploid E weight gain (trisomy/trisomy) of odd-numbered chromosomes and monosomy 13, standard risk.  Gain of 1 q. associated with progressive course. - PET scan (11/05/2021): No evidence of multiple myeloma.  No hypermetabolic adenopathy. - Daratumumab, Revlimid and dexamethasone cycle 1 started on 11/30/2021   2.  JAK2 positive essential thrombocytosis: - BMBX (11/02/2021): No evidence of fibrosis.  No morphological evidence of JAK2 positive ET.   3.  History of marginal zone lymphoma of the spleen: - Diagnosed in 1999, status post CHOP x7 cycles followed by rituximab 1 cycle. - Status post splenectomy.  No evidence of lymphoma on current PET scan.   4.  Social/family history: - She lives with her sister.  Son lives 30 minutes away.  She is independent of ADLs and some IADLs.  She drives.  She worked at [employer] and denies any exposure to chemicals or pesticides.  No family history of malignancies.  5.  Bone protection (DEXA scan 02/17/2022 T-score -0.9): - She saw dentist who recommended dental extractions.  She does not want zoledronic acid or denosumab.  6.  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 - Initial needle biopsy on  01/19/2023 showed malignant hematopoietic neoplasm. - Excision biopsy on 02/18/2023: Diffuse large B-cell lymphoma. - Cycle 1 of R mini CHOP on 03/30/2023    Plan: 1.  IgG lambda multiple myeloma: - Revlimid and Darzalex are on hold.  Last myeloma labs on 03/02/2023 with M spike 0.1 g and FLC ratio 2.72.  Will resume myeloma therapy after completion of chemotherapy.   2.  Anemia from CKD and iron deficiency: - Last Feraheme in August 2024.  Last transfusion on 03/29/2023.  Hemoglobin today is 8.  Ferritin is low at 10 and percent saturation 4.  Will give her INFeD 1 g IV x 1.   3.  Bilateral leg swellings: - Continue Lasix 20 mg daily as needed.  Swelling slightly improved.   4.  Pulmonary embolism (Dx 07/03/2022):: - Continue Lasix twice daily indefinitely.  No bleeding issues.   5.  Hypokalemia: - Continue potassium 40 mill equivalents daily.  Potassium is normal at 3.9.  6.  Diffuse large B-cell lymphoma of leg type: - She received cycle 1 of 4 mini CHOP on 03/30/2023. - She tolerated it reasonably well. - Labs today: Normal LFTs with albumin 3.1.  Creatinine 1.17.  CBC shows normal white count and elevated platelet count.  Hemoglobin is 8. - She may proceed with cycle 2 without any dose modifications.  RTC 3 weeks for follow-up.    Orders Placed This Encounter  Procedures   Iron and TIBC ([facility] ONLY)   Ferritin     I,Helena R Teague,acting as a scribe for Doreatha Massed, MD.,have documented all relevant  documentation on the behalf of Doreatha Massed, MD,as directed by  Doreatha Massed, MD while in the presence of Doreatha Massed, MD.  I, Doreatha Massed MD, have reviewed the above documentation for accuracy and completeness, and I agree with the above.     Doreatha Massed, MD   2/25/20255:00 PM  CHIEF COMPLAINT:   Diagnosis: multiple myeloma    Cancer Staging  Multiple myeloma (HCC) Staging form: Plasma Cell Myeloma and  Plasma Cell Disorders, AJCC 8th Edition - Clinical stage from 11/16/2021: Albumin (g/dL): 3.1, ISS: Stage II, High-risk cytogenetics: Absent, LDH: Normal - Unsigned  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) Staging form: Primary Cutaneous B-Cell/T-Cell Lymphoma (Non-MF/SS Lymphoma), AJCC 8th Edition - Clinical stage from 03/01/2023: cT1a, cN0, cM0 - Unsigned    Prior Therapy: none  Current Therapy:  Daratumumab, Revlimid and dexamethasone    HISTORY OF PRESENT ILLNESS:   Oncology History  NHL (non-Hodgkin's lymphoma) (HCC)  07/19/1995 Pathology Results   Spleen biopsy with resection- low-grade B-cell lymphoma, splenic marginal zone type   02/20/1998 Imaging   CT CAP-marked and extensive cervical adenopathy   02/25/1998 Pathology Results   Right cervical lymph node demonstrating large B-cell lymphoma   03/05/1998 Bone Marrow Biopsy   No evidence of bone marrow involvement   03/11/1998 - 07/18/1998 Chemotherapy   CHOP x 7 cycles   05/09/1998 Imaging   CT CAP and neck- slight interval decrease in size of para-aortic adenopathy with interval decrease in right inguinal and bi-iliac adenopathy.  Interval decrease in size and number of enlarged cervical nodes   07/10/1998 Imaging   Ct CAP and neck- unremarkable CT of chest. No enlarged retroperitoneal adenopathy withthe previously noted retroperitoneal nodes currently smaller to stable in size.  Cervical adenopathy is stable to slightly smaller in size.   08/18/1998 - 09/08/1998 Chemotherapy   Rituxan Day 1, 8, 15, 22 x 1 cycle   01/08/1999 Remission   CT CAP and neck demonstrates no adenopathy or disease   01/08/1999 Imaging   CT CAP and neck- Stable CT of chest. Stable CT abd/pelvis without adenopathy.  Negative CT of neck   Multiple myeloma (HCC)  11/16/2021 Initial Diagnosis   Multiple myeloma (HCC)   11/30/2021 - 03/02/2023 Chemotherapy   Patient is on Treatment Plan : R-CHOP 03/29/23 - MYELOMA   Daratumumab SQ + Lenalidomide + Dexamethasone (DaraRd) q28d     03/29/2023 -  Chemotherapy   Patient is on Treatment Plan : NON-HODGKINS LYMPHOMA R-CHOP q21d x 4 cycles        INTERVAL HISTORY:   Angell is a 89 y.o. female seen for follow-up of multiple myeloma.  She was last seen by me on 04/05/23.  Today, she states that she is doing well overall. Her appetite level is at 100%. Her energy level is at 100%. She is accompanied by a family member.  Brytni is taking her fluid pill as described which has decreased the edema in her legs. She has lost 4 pounds since her last visit, likely due to improving edema.   Caleah denies any nausea or vomiting after last treatment. She did have constipation after treatment. She denies any dysuria or bleeding issues. Neuropathy is stable in the fingers.   PAST MEDICAL HISTORY:   Past Medical History: Past Medical History:  Diagnosis Date   Anxiety    Aortic atherosclerosis (HCC)    Complication of anesthesia    difficult to wake up   Depression    Heart failure  with mildly reduced ejection fraction (HFmrEF) (HCC)    Hypercholesteremia    Hypertension    Hypokalemia    Iron deficiency anemia    Left hip pain    NHL (non-Hodgkin's lymphoma) (HCC) 12/17/2010   Obesity    Primary thrombocytosis (HCC) 01/11/2006   Secondary to splenectomy     PVD (peripheral vascular disease) (HCC)    S/P splenectomy 01/01/2014   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)    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   Vertigo, labyrinthine     Surgical History: Past Surgical History:  Procedure Laterality Date   CATARACT EXTRACTION W/PHACO Left 11/03/2015   Procedure: CATARACT EXTRACTION PHACO AND INTRAOCULAR LENS PLACEMENT (IOC);  Surgeon: Gemma Payor, MD;  Location: [facility];  Service: Ophthalmology;  Laterality: Left;  CDE: 7.74   CATARACT EXTRACTION W/PHACO Right 11/20/2015   Procedure: CATARACT EXTRACTION PHACO AND INTRAOCULAR  LENS PLACEMENT ; CDE:  8.30;  Surgeon: Gemma Payor, MD;  Location: [facility];  Service: Ophthalmology;  Laterality: Right;   IR IMAGING GUIDED PORT INSERTION  11/04/2021   MASS EXCISION Right 02/18/2023   Procedure: Incisional Biopsy Right Lower Extremity;  Surgeon: Lucretia Roers, MD;  Location: [facility];  Service: General;  Laterality: Right;   PORT-A-CATH REMOVAL      Social History: Social History   Socioeconomic History   Marital status: Widowed    Spouse name: Not on file   Number of children: Not on file   Years of education: Not on file   Highest education level: Not on file  Occupational History   Not on file  Tobacco Use   Smoking status: Former   Smokeless tobacco: Never  Vaping Use   Vaping status: Never Used  Substance and Sexual Activity   Alcohol use: No   Drug use: No   Sexual activity: Not Currently  Other Topics Concern   Not on file  Social History Narrative   Not on file   Social Drivers of Health   Financial Resource Strain: Low Risk  (12/31/2019)   Overall Financial Resource Strain (CARDIA)    Difficulty of Paying Living Expenses: Not hard at all  Food Insecurity: No Food Insecurity (07/03/2022)   Hunger Vital Sign    Worried About Running Out of Food in the Last Year: Never true    Ran Out of Food in the Last Year: Never true  Transportation Needs: No Transportation Needs (07/03/2022)   PRAPARE - Administrator, Civil Service (Medical): No    Lack of Transportation (Non-Medical): No  Physical Activity: Inactive (12/31/2019)   Exercise Vital Sign    Days of Exercise per Week: 0 days    Minutes of Exercise per Session: 0 min  Stress: No Stress Concern Present (12/31/2019)   [employer] of Occupational Health - Occupational Stress Questionnaire    Feeling of Stress : Not at all  Social Connections: Moderately Isolated (12/31/2019)   Social Connection and Isolation Panel [NHANES]    Frequency of Communication with Friends and Family:  More than three times a week    Frequency of Social Gatherings with Friends and Family: More than three times a week    Attends Religious Services: More than 4 times per year    Active Member of [employer] or Organizations: No    Attends Banker Meetings: Never    Marital Status: Widowed  Intimate Partner Violence: Not At Risk (07/03/2022)   Humiliation, Afraid, Rape,  and Kick questionnaire    Fear of Current or Ex-Partner: No    Emotionally Abused: No    Physically Abused: No    Sexually Abused: No    Family History: Family History  Problem Relation Age of Onset   Diabetes Mother     Current Medications:  Current Outpatient Medications:    acetaminophen (TYLENOL) 500 MG tablet, Take 1,000 mg by mouth every 6 (six) hours as needed for moderate pain., Disp: , Rfl:    acyclovir (ZOVIRAX) 400 MG tablet, Take 1 tablet (400 mg total) by mouth 2 (two) times daily., Disp: 60 tablet, Rfl: 6   allopurinol (ZYLOPRIM) 300 MG tablet, Take 1 tablet (300 mg total) by mouth daily., Disp: 30 tablet, Rfl: 0   apixaban (ELIQUIS) 5 MG TABS tablet, Take 1 tablet (5 mg total) by mouth 2 (two) times daily., Disp: 60 tablet, Rfl: 1   atorvastatin (LIPITOR) 40 MG tablet, Take 40 mg by mouth at bedtime., Disp: , Rfl:    CYCLOPHOSPHAMIDE IV, Inject into the vein every 21 ( twenty-one) days., Disp: , Rfl:    dapagliflozin propanediol (FARXIGA) 10 MG TABS tablet, Take 1 tablet (10 mg total) by mouth daily., Disp: 30 tablet, Rfl: 1   dexamethasone (DECADRON) 4 MG tablet, Take 10 mg (2.5 tablets) by mouth weekly every Wednesday morning Per note by Dr. Ellin Saba on 09/15/2022, Disp: 20 tablet, Rfl: 6   DOXOrubicin HCl (ADRIAMYCIN IV), Inject into the vein every 21 ( twenty-one) days., Disp: , Rfl:    furosemide (LASIX) 20 MG tablet, Take 1 tablet (20 mg total) by mouth every morning., Disp: 30 tablet, Rfl: 3   lenalidomide (REVLIMID) 10 MG capsule, Take 1 capsule (10 mg total) by mouth daily. Take for 21  days on, 7 days off., Disp: 21 capsule, Rfl: 0   losartan (COZAAR) 25 MG tablet, Take 12.5 mg by mouth daily., Disp: , Rfl:    metoprolol succinate (TOPROL-XL) 25 MG 24 hr tablet, Take 0.5 tablets (12.5 mg total) by mouth daily., Disp: 30 tablet, Rfl: 0   oxyCODONE (ROXICODONE) 5 MG immediate release tablet, Take 1 tablet (5 mg total) by mouth every 4 (four) hours as needed for severe pain (pain score 7-10) or breakthrough pain., Disp: 5 tablet, Rfl: 0   potassium chloride SA (KLOR-CON M20) 20 MEQ tablet, Take 1 tablet on the days you take lasix (Patient taking differently: Take 20 mEq by mouth 2 (two) times daily.), Disp: 180 tablet, Rfl: 3   predniSONE (DELTASONE) 20 MG tablet, Take 3 tablets (60 mg total) by mouth daily. Take with food on days 1-5 of chemotherapy., Disp: 25 tablet, Rfl: 5   prochlorperazine (COMPAZINE) 10 MG tablet, Take 1 tablet (10 mg total) by mouth every 6 (six) hours as needed for nausea or vomiting., Disp: 30 tablet, Rfl: 6   riTUXimab (RITUXAN IV), Inject into the vein every 21 ( twenty-one) days., Disp: , Rfl:    VINCRISTINE SULFATE IV, Inject into the vein every 21 ( twenty-one) days., Disp: , Rfl:    lidocaine-prilocaine (EMLA) cream, Apply 1 Application topically as needed. (Patient not taking: Reported on 04/19/2023), Disp: 30 g, Rfl: 1   lidocaine-prilocaine (EMLA) cream, Apply to affected area once (Patient not taking: Reported on 04/19/2023), Disp: 30 g, Rfl: 3   ondansetron (ZOFRAN) 4 MG tablet, Take 1 tablet (4 mg total) by mouth every 8 (eight) hours as needed. (Patient not taking: Reported on 04/19/2023), Disp: 30 tablet, Rfl: 1 No current facility-administered medications  for this visit.  Facility-Administered Medications Ordered in Other Visits:    0.9 %  sodium chloride infusion, , Intravenous, Continuous, Doreatha Massed, MD, Stopped at 03/29/23 1333   0.9 %  sodium chloride infusion, , Intravenous, Continuous, Doreatha Massed, MD, Stopped at 04/19/23  1443   sodium chloride flush (NS) 0.9 % injection 10 mL, 10 mL, Intracatheter, PRN, Doreatha Massed, MD, 10 mL at 03/29/23 1336   sodium chloride flush (NS) 0.9 % injection 10 mL, 10 mL, Intracatheter, PRN, Doreatha Massed, MD, 10 mL at 04/19/23 1444   Allergies: Allergies  Allergen Reactions   Penicillins Rash    REVIEW OF SYSTEMS:   Review of Systems  Constitutional:  Negative for chills, fatigue and fever.  HENT:   Negative for lump/mass, mouth sores, nosebleeds, sore throat and trouble swallowing.   Eyes:  Negative for eye problems.  Respiratory:  Negative for cough and shortness of breath.   Cardiovascular:  Negative for chest pain, leg swelling and palpitations.  Gastrointestinal:  Negative for abdominal pain, constipation, diarrhea, nausea and vomiting.  Genitourinary:  Negative for bladder incontinence, difficulty urinating, dysuria, frequency, hematuria and nocturia.   Musculoskeletal:  Negative for arthralgias, back pain, flank pain, myalgias and neck pain.  Skin:  Negative for itching and rash.  Neurological:  Positive for dizziness and numbness (in fingers). Negative for headaches.  Hematological:  Does not bruise/bleed easily.  Psychiatric/Behavioral:  Negative for depression, sleep disturbance and suicidal ideas. The patient is not nervous/anxious.   All other systems reviewed and are negative.    VITALS:   There were no vitals taken for this visit.  Wt Readings from Last 3 Encounters:  04/19/23 121 lb 11.1 oz (55.2 kg)  04/05/23 125 lb 14.1 oz (57.1 kg)  03/29/23 128 lb 12.8 oz (58.4 kg)    There is no height or weight on file to calculate BMI.  Performance status (ECOG): 1 - Symptomatic but completely ambulatory  PHYSICAL EXAM:   Physical Exam Vitals and nursing note reviewed. Exam conducted with a chaperone present.  Constitutional:      Appearance: Normal appearance.  Cardiovascular:     Rate and Rhythm: Normal rate and regular rhythm.      Pulses: Normal pulses.     Heart sounds: Normal heart sounds.  Pulmonary:     Effort: Pulmonary effort is normal.     Breath sounds: Normal breath sounds.  Abdominal:     Palpations: Abdomen is soft. There is no hepatomegaly, splenomegaly or mass.     Tenderness: There is no abdominal tenderness.  Musculoskeletal:     Right lower leg: No edema.     Left lower leg: No edema.  Lymphadenopathy:     Cervical: No cervical adenopathy.     Right cervical: No superficial, deep or posterior cervical adenopathy.    Left cervical: No superficial, deep or posterior cervical adenopathy.     Upper Body:     Right upper body: No supraclavicular or axillary adenopathy.     Left upper body: No supraclavicular or axillary adenopathy.  Neurological:     General: No focal deficit present.     Mental Status: She is alert and oriented to person, place, and time.  Psychiatric:        Mood and Affect: Mood normal.        Behavior: Behavior normal.     LABS:      Latest Ref Rng & Units 04/19/2023    8:20 AM 04/05/2023   10:09  AM 03/29/2023    7:48 AM  CBC  WBC 4.0 - 10.5 K/uL 8.7  8.6  5.6   Hemoglobin 12.0 - 15.0 g/dL 8.0  8.4  7.7   Hematocrit 36.0 - 46.0 % 25.8  27.1  25.6   Platelets 150 - 400 K/uL 664  275  506       Latest Ref Rng & Units 04/19/2023    8:20 AM 04/05/2023   10:09 AM 03/29/2023    7:48 AM  CMP  Glucose 70 - 99 mg/dL 409  99  93   BUN 8 - 23 mg/dL 21  15  20    Creatinine 0.44 - 1.00 mg/dL 8.11  9.14  7.82   Sodium 135 - 145 mmol/L 140  140  138   Potassium 3.5 - 5.1 mmol/L 3.9  3.9  3.7   Chloride 98 - 111 mmol/L 108  111  108   CO2 22 - 32 mmol/L 23  23  21    Calcium 8.9 - 10.3 mg/dL 8.8  8.0  8.6   Total Protein 6.5 - 8.1 g/dL 5.4  4.9  5.3   Total Bilirubin 0.0 - 1.2 mg/dL 0.4  0.5  0.6   Alkaline Phos 38 - 126 U/L 70  101  66   AST 15 - 41 U/L 13  11  12    ALT 0 - 44 U/L 9  7  11       No results found for: "CEA1", "CEA" / No results found for: "CEA1", "CEA" No  results found for: "PSA1" No results found for: "CAN199" No results found for: "CAN125"  Lab Results  Component Value Date   TOTALPROTELP 5.4 (L) 03/02/2023   ALBUMINELP 3.1 03/02/2023   A1GS 0.3 03/02/2023   A2GS 0.7 03/02/2023   BETS 1.0 03/02/2023   GAMS 0.4 03/02/2023   MSPIKE 0.1 (H) 03/02/2023   SPEI Comment 03/02/2023   Lab Results  Component Value Date   TIBC 358 04/19/2023   TIBC 364 03/16/2023   TIBC 375 09/15/2022   FERRITIN 10 (L) 04/19/2023   FERRITIN 6 (L) 03/16/2023   FERRITIN 7 (L) 09/15/2022   IRONPCTSAT 4 (L) 04/19/2023   IRONPCTSAT 12 03/16/2023   IRONPCTSAT 5 (L) 09/15/2022   Lab Results  Component Value Date   LDH 122 03/02/2023   LDH 154 09/30/2021   LDH 148 08/05/2021     STUDIES:   No results found.

## 2023-04-19 ENCOUNTER — Inpatient Hospital Stay: Payer: Medicare Other | Admitting: Hematology

## 2023-04-19 ENCOUNTER — Inpatient Hospital Stay: Payer: Medicare Other

## 2023-04-19 VITALS — BP 144/67 | HR 63 | Temp 97.0°F | Resp 16

## 2023-04-19 DIAGNOSIS — I130 Hypertensive heart and chronic kidney disease with heart failure and stage 1 through stage 4 chronic kidney disease, or unspecified chronic kidney disease: Secondary | ICD-10-CM | POA: Diagnosis not present

## 2023-04-19 DIAGNOSIS — Z5189 Encounter for other specified aftercare: Secondary | ICD-10-CM | POA: Diagnosis not present

## 2023-04-19 DIAGNOSIS — D631 Anemia in chronic kidney disease: Secondary | ICD-10-CM | POA: Diagnosis not present

## 2023-04-19 DIAGNOSIS — Z7901 Long term (current) use of anticoagulants: Secondary | ICD-10-CM | POA: Diagnosis not present

## 2023-04-19 DIAGNOSIS — M7989 Other specified soft tissue disorders: Secondary | ICD-10-CM | POA: Diagnosis not present

## 2023-04-19 DIAGNOSIS — I2699 Other pulmonary embolism without acute cor pulmonale: Secondary | ICD-10-CM | POA: Diagnosis not present

## 2023-04-19 DIAGNOSIS — K5900 Constipation, unspecified: Secondary | ICD-10-CM | POA: Diagnosis not present

## 2023-04-19 DIAGNOSIS — D75839 Thrombocytosis, unspecified: Secondary | ICD-10-CM | POA: Diagnosis not present

## 2023-04-19 DIAGNOSIS — R350 Frequency of micturition: Secondary | ICD-10-CM | POA: Diagnosis not present

## 2023-04-19 DIAGNOSIS — D509 Iron deficiency anemia, unspecified: Secondary | ICD-10-CM

## 2023-04-19 DIAGNOSIS — C9000 Multiple myeloma not having achieved remission: Secondary | ICD-10-CM

## 2023-04-19 DIAGNOSIS — Z79624 Long term (current) use of inhibitors of nucleotide synthesis: Secondary | ICD-10-CM | POA: Diagnosis not present

## 2023-04-19 DIAGNOSIS — C83398 Diffuse large b-cell lymphoma of other extranodal and solid organ sites: Secondary | ICD-10-CM | POA: Diagnosis not present

## 2023-04-19 DIAGNOSIS — Z9221 Personal history of antineoplastic chemotherapy: Secondary | ICD-10-CM | POA: Diagnosis not present

## 2023-04-19 DIAGNOSIS — R918 Other nonspecific abnormal finding of lung field: Secondary | ICD-10-CM | POA: Diagnosis not present

## 2023-04-19 DIAGNOSIS — Z5111 Encounter for antineoplastic chemotherapy: Secondary | ICD-10-CM | POA: Diagnosis not present

## 2023-04-19 DIAGNOSIS — N189 Chronic kidney disease, unspecified: Secondary | ICD-10-CM | POA: Diagnosis not present

## 2023-04-19 DIAGNOSIS — I739 Peripheral vascular disease, unspecified: Secondary | ICD-10-CM | POA: Diagnosis not present

## 2023-04-19 DIAGNOSIS — Z79899 Other long term (current) drug therapy: Secondary | ICD-10-CM | POA: Diagnosis not present

## 2023-04-19 DIAGNOSIS — E876 Hypokalemia: Secondary | ICD-10-CM | POA: Diagnosis not present

## 2023-04-19 DIAGNOSIS — E7800 Pure hypercholesterolemia, unspecified: Secondary | ICD-10-CM | POA: Diagnosis not present

## 2023-04-19 DIAGNOSIS — G629 Polyneuropathy, unspecified: Secondary | ICD-10-CM | POA: Diagnosis not present

## 2023-04-19 DIAGNOSIS — Z7984 Long term (current) use of oral hypoglycemic drugs: Secondary | ICD-10-CM | POA: Diagnosis not present

## 2023-04-19 DIAGNOSIS — Z7961 Long term (current) use of immunomodulator: Secondary | ICD-10-CM | POA: Diagnosis not present

## 2023-04-19 LAB — CBC WITH DIFFERENTIAL/PLATELET
Abs Immature Granulocytes: 0.02 10*3/uL (ref 0.00–0.07)
Basophils Absolute: 0.1 10*3/uL (ref 0.0–0.1)
Basophils Relative: 1 %
Eosinophils Absolute: 0 10*3/uL (ref 0.0–0.5)
Eosinophils Relative: 0 %
HCT: 25.8 % — ABNORMAL LOW (ref 36.0–46.0)
Hemoglobin: 8 g/dL — ABNORMAL LOW (ref 12.0–15.0)
Immature Granulocytes: 0 %
Lymphocytes Relative: 23 %
Lymphs Abs: 2 10*3/uL (ref 0.7–4.0)
MCH: 26.1 pg (ref 26.0–34.0)
MCHC: 31 g/dL (ref 30.0–36.0)
MCV: 84 fL (ref 80.0–100.0)
Monocytes Absolute: 1.4 10*3/uL — ABNORMAL HIGH (ref 0.1–1.0)
Monocytes Relative: 16 %
Neutro Abs: 5.2 10*3/uL (ref 1.7–7.7)
Neutrophils Relative %: 60 %
Platelets: 664 10*3/uL — ABNORMAL HIGH (ref 150–400)
RBC: 3.07 MIL/uL — ABNORMAL LOW (ref 3.87–5.11)
RDW: 22.5 % — ABNORMAL HIGH (ref 11.5–15.5)
Smear Review: INCREASED
WBC: 8.7 10*3/uL (ref 4.0–10.5)
nRBC: 0.6 % — ABNORMAL HIGH (ref 0.0–0.2)

## 2023-04-19 LAB — IRON AND TIBC
Iron: 13 ug/dL — ABNORMAL LOW (ref 28–170)
Saturation Ratios: 4 % — ABNORMAL LOW (ref 10.4–31.8)
TIBC: 358 ug/dL (ref 250–450)
UIBC: 345 ug/dL

## 2023-04-19 LAB — COMPREHENSIVE METABOLIC PANEL
ALT: 9 U/L (ref 0–44)
AST: 13 U/L — ABNORMAL LOW (ref 15–41)
Albumin: 3.1 g/dL — ABNORMAL LOW (ref 3.5–5.0)
Alkaline Phosphatase: 70 U/L (ref 38–126)
Anion gap: 9 (ref 5–15)
BUN: 21 mg/dL (ref 8–23)
CO2: 23 mmol/L (ref 22–32)
Calcium: 8.8 mg/dL — ABNORMAL LOW (ref 8.9–10.3)
Chloride: 108 mmol/L (ref 98–111)
Creatinine, Ser: 1.17 mg/dL — ABNORMAL HIGH (ref 0.44–1.00)
GFR, Estimated: 45 mL/min — ABNORMAL LOW (ref >60)
Glucose, Bld: 126 mg/dL — ABNORMAL HIGH (ref 70–99)
Potassium: 3.9 mmol/L (ref 3.5–5.1)
Sodium: 140 mmol/L (ref 135–145)
Total Bilirubin: 0.4 mg/dL (ref 0.0–1.2)
Total Protein: 5.4 g/dL — ABNORMAL LOW (ref 6.5–8.1)

## 2023-04-19 LAB — URIC ACID: Uric Acid, Serum: 6.2 mg/dL (ref 2.5–7.1)

## 2023-04-19 LAB — MAGNESIUM: Magnesium: 1.8 mg/dL (ref 1.7–2.4)

## 2023-04-19 LAB — FERRITIN: Ferritin: 10 ng/mL — ABNORMAL LOW (ref 11–307)

## 2023-04-19 LAB — SAMPLE TO BLOOD BANK

## 2023-04-19 MED ORDER — VINCRISTINE SULFATE CHEMO INJECTION 1 MG/ML
1.0000 mg | Freq: Once | INTRAVENOUS | Status: AC
  Administered 2023-04-19: 1 mg via INTRAVENOUS
  Filled 2023-04-19: qty 1

## 2023-04-19 MED ORDER — SODIUM CHLORIDE 0.9 % IV SOLN: INTRAVENOUS | Status: DC

## 2023-04-19 MED ORDER — ACETAMINOPHEN 325 MG PO TABS
650.0000 mg | ORAL_TABLET | Freq: Once | ORAL | Status: AC
  Administered 2023-04-19: 650 mg via ORAL
  Filled 2023-04-19: qty 2

## 2023-04-19 MED ORDER — DEXAMETHASONE SODIUM PHOSPHATE 10 MG/ML IJ SOLN
10.0000 mg | Freq: Once | INTRAMUSCULAR | Status: AC
  Administered 2023-04-19: 10 mg via INTRAVENOUS
  Filled 2023-04-19: qty 1

## 2023-04-19 MED ORDER — SODIUM CHLORIDE 0.9 % IV SOLN
375.0000 mg/m2 | Freq: Once | INTRAVENOUS | Status: AC
  Administered 2023-04-19: 600 mg via INTRAVENOUS
  Filled 2023-04-19: qty 50

## 2023-04-19 MED ORDER — SODIUM CHLORIDE 0.9 % IV SOLN
150.0000 mg | Freq: Once | INTRAVENOUS | Status: AC
  Administered 2023-04-19: 150 mg via INTRAVENOUS
  Filled 2023-04-19: qty 150

## 2023-04-19 MED ORDER — HEPARIN SOD (PORK) LOCK FLUSH 100 UNIT/ML IV SOLN
500.0000 [IU] | Freq: Once | INTRAVENOUS | Status: AC | PRN
  Administered 2023-04-19: 500 [IU]

## 2023-04-19 MED ORDER — FAMOTIDINE IN NACL 20-0.9 MG/50ML-% IV SOLN
20.0000 mg | Freq: Once | INTRAVENOUS | Status: AC
  Administered 2023-04-19: 20 mg via INTRAVENOUS
  Filled 2023-04-19: qty 50

## 2023-04-19 MED ORDER — PALONOSETRON HCL INJECTION 0.25 MG/5ML
0.2500 mg | Freq: Once | INTRAVENOUS | Status: AC
  Administered 2023-04-19: 0.25 mg via INTRAVENOUS
  Filled 2023-04-19: qty 5

## 2023-04-19 MED ORDER — SODIUM CHLORIDE 0.9% FLUSH
10.0000 mL | INTRAVENOUS | Status: DC | PRN
  Administered 2023-04-19: 10 mL

## 2023-04-19 MED ORDER — CYCLOPHOSPHAMIDE CHEMO INJECTION 1 GM
400.0000 mg/m2 | Freq: Once | INTRAMUSCULAR | Status: AC
  Administered 2023-04-19: 600 mg via INTRAVENOUS
  Filled 2023-04-19: qty 30

## 2023-04-19 MED ORDER — CETIRIZINE HCL 10 MG/ML IV SOLN
10.0000 mg | Freq: Once | INTRAVENOUS | Status: AC
  Administered 2023-04-19: 10 mg via INTRAVENOUS
  Filled 2023-04-19: qty 1

## 2023-04-19 MED ORDER — SODIUM CHLORIDE 0.9% FLUSH
10.0000 mL | INTRAVENOUS | Status: DC | PRN
  Administered 2023-04-19: 10 mL via INTRAVENOUS

## 2023-04-19 MED ORDER — DOXORUBICIN HCL CHEMO IV INJECTION 2 MG/ML
25.0000 mg/m2 | Freq: Once | INTRAVENOUS | Status: AC
  Administered 2023-04-19: 38 mg via INTRAVENOUS
  Filled 2023-04-19: qty 19

## 2023-04-19 NOTE — Patient Instructions (Signed)
 [facility] [place] - A DEPT OF [facility]  Discharge Instructions: Thank you for choosing [facility] to provide your oncology and hematology care.  If you have a lab appointment with the Cancer Center - please note that after April 8th, 2024, all labs will be drawn in the cancer center.  You do not have to check in or register with the main entrance as you have in the past but will complete your check-in in the cancer center.  Wear comfortable clothing and clothing appropriate for easy access to any Portacath or PICC line.   We strive to give you quality time with your provider. You may need to reschedule your appointment if you arrive late (15 or more minutes).  Arriving late affects you and other patients whose appointments are after yours.  Also, if you miss three or more appointments without notifying the office, you may be dismissed from the clinic at the provider's discretion.      For prescription refill requests, have your pharmacy contact our office and allow 72 hours for refills to be completed.    Today you received the following chemotherapy and/or immunotherapy agents R-CHOP, return as scheduled.   To help prevent nausea and vomiting after your treatment, we encourage you to take your nausea medication as directed.  BELOW ARE SYMPTOMS THAT SHOULD BE REPORTED IMMEDIATELY: *FEVER GREATER THAN 100.4 F (38 C) OR HIGHER *CHILLS OR SWEATING *NAUSEA AND VOMITING THAT IS NOT CONTROLLED WITH YOUR NAUSEA MEDICATION *UNUSUAL SHORTNESS OF BREATH *UNUSUAL BRUISING OR BLEEDING *URINARY PROBLEMS (pain or burning when urinating, or frequent urination) *BOWEL PROBLEMS (unusual diarrhea, constipation, pain near the anus) TENDERNESS IN MOUTH AND THROAT WITH OR WITHOUT PRESENCE OF ULCERS (sore throat, sores in mouth, or a toothache) UNUSUAL RASH, SWELLING OR PAIN  UNUSUAL VAGINAL DISCHARGE OR ITCHING   Items with * indicate a potential emergency and  should be followed up as soon as possible or go to the Emergency Department if any problems should occur.  Please show the CHEMOTHERAPY ALERT CARD or IMMUNOTHERAPY ALERT CARD at check-in to the Emergency Department and triage nurse.  Should you have questions after your visit or need to cancel or reschedule your appointment, please contact [facility] [place] - A DEPT OF Eligh[facility] Brideg[facility] [phone]  and follow the prompts.  Office hours are 8:00 a.m. to 4:30 p.m. Monday - Friday. Please note that voicemails left after 4:00 p.m. may not be returned until the following business day.  We are closed weekends and major holidays. You have access to a nurse at all times for urgent questions. Please call the main number to the clinic [phone] and follow the prompts.  For any non-urgent questions, you may also contact your provider using MyChart. We now offer e-Visits for anyone 46 and older to request care online for non-urgent symptoms. For details visit [email or web address].   Also download the MyChart app! Go to the app store, search "MyChart", open the app, select [place], and log in with your MyChart username and password.

## 2023-04-19 NOTE — Progress Notes (Signed)
 Patient okay for treatment today per Dr. Ellin Saba. Patient tolerated chemotherapy with no complaints voiced. Side effects with management reviewed understanding verbalized. Port site clean and dry with no bruising or swelling noted at site. Good blood return noted before and after administration of chemotherapy. Patient left accessed for infed tomorrow. Patient left in satisfactory condition with VSS and no s/s of distress noted.

## 2023-04-19 NOTE — Progress Notes (Signed)
 Patient has been examined by Dr. Ellin Saba. Vital signs and labs have been reviewed by MD - ANC, Creatinine, LFTs, hemoglobin, and platelets are within treatment parameters per M.D. - pt may proceed with treatment.  Primary RN and pharmacy notified.

## 2023-04-19 NOTE — Patient Instructions (Signed)

## 2023-04-20 ENCOUNTER — Other Ambulatory Visit: Payer: Self-pay

## 2023-04-20 ENCOUNTER — Inpatient Hospital Stay: Payer: Medicare Other

## 2023-04-20 VITALS — BP 118/81 | HR 58 | Temp 98.0°F | Resp 18

## 2023-04-20 DIAGNOSIS — Z79624 Long term (current) use of inhibitors of nucleotide synthesis: Secondary | ICD-10-CM | POA: Diagnosis not present

## 2023-04-20 DIAGNOSIS — C9000 Multiple myeloma not having achieved remission: Secondary | ICD-10-CM | POA: Diagnosis not present

## 2023-04-20 DIAGNOSIS — G629 Polyneuropathy, unspecified: Secondary | ICD-10-CM | POA: Diagnosis not present

## 2023-04-20 DIAGNOSIS — R350 Frequency of micturition: Secondary | ICD-10-CM | POA: Diagnosis not present

## 2023-04-20 DIAGNOSIS — D75839 Thrombocytosis, unspecified: Secondary | ICD-10-CM | POA: Diagnosis not present

## 2023-04-20 DIAGNOSIS — N189 Chronic kidney disease, unspecified: Secondary | ICD-10-CM | POA: Diagnosis not present

## 2023-04-20 DIAGNOSIS — I130 Hypertensive heart and chronic kidney disease with heart failure and stage 1 through stage 4 chronic kidney disease, or unspecified chronic kidney disease: Secondary | ICD-10-CM | POA: Diagnosis not present

## 2023-04-20 DIAGNOSIS — Z5111 Encounter for antineoplastic chemotherapy: Secondary | ICD-10-CM | POA: Diagnosis not present

## 2023-04-20 DIAGNOSIS — Z9221 Personal history of antineoplastic chemotherapy: Secondary | ICD-10-CM | POA: Diagnosis not present

## 2023-04-20 DIAGNOSIS — D631 Anemia in chronic kidney disease: Secondary | ICD-10-CM | POA: Diagnosis not present

## 2023-04-20 DIAGNOSIS — I2699 Other pulmonary embolism without acute cor pulmonale: Secondary | ICD-10-CM | POA: Diagnosis not present

## 2023-04-20 DIAGNOSIS — M7989 Other specified soft tissue disorders: Secondary | ICD-10-CM | POA: Diagnosis not present

## 2023-04-20 DIAGNOSIS — Z7984 Long term (current) use of oral hypoglycemic drugs: Secondary | ICD-10-CM | POA: Diagnosis not present

## 2023-04-20 DIAGNOSIS — Z7961 Long term (current) use of immunomodulator: Secondary | ICD-10-CM | POA: Diagnosis not present

## 2023-04-20 DIAGNOSIS — E876 Hypokalemia: Secondary | ICD-10-CM | POA: Diagnosis not present

## 2023-04-20 DIAGNOSIS — I739 Peripheral vascular disease, unspecified: Secondary | ICD-10-CM | POA: Diagnosis not present

## 2023-04-20 DIAGNOSIS — D509 Iron deficiency anemia, unspecified: Secondary | ICD-10-CM

## 2023-04-20 DIAGNOSIS — D702 Other drug-induced agranulocytosis: Secondary | ICD-10-CM

## 2023-04-20 DIAGNOSIS — K5900 Constipation, unspecified: Secondary | ICD-10-CM | POA: Diagnosis not present

## 2023-04-20 DIAGNOSIS — C83398 Diffuse large b-cell lymphoma of other extranodal and solid organ sites: Secondary | ICD-10-CM | POA: Diagnosis not present

## 2023-04-20 DIAGNOSIS — Z7901 Long term (current) use of anticoagulants: Secondary | ICD-10-CM | POA: Diagnosis not present

## 2023-04-20 DIAGNOSIS — E7800 Pure hypercholesterolemia, unspecified: Secondary | ICD-10-CM | POA: Diagnosis not present

## 2023-04-20 DIAGNOSIS — Z79899 Other long term (current) drug therapy: Secondary | ICD-10-CM | POA: Diagnosis not present

## 2023-04-20 DIAGNOSIS — R918 Other nonspecific abnormal finding of lung field: Secondary | ICD-10-CM | POA: Diagnosis not present

## 2023-04-20 DIAGNOSIS — Z5189 Encounter for other specified aftercare: Secondary | ICD-10-CM | POA: Diagnosis not present

## 2023-04-20 MED ORDER — ACETAMINOPHEN 325 MG PO TABS
650.0000 mg | ORAL_TABLET | Freq: Once | ORAL | Status: AC
  Administered 2023-04-20: 650 mg via ORAL
  Filled 2023-04-20: qty 2

## 2023-04-20 MED ORDER — PEGFILGRASTIM-CBQV 6 MG/0.6ML ~~LOC~~ SOSY
6.0000 mg | PREFILLED_SYRINGE | Freq: Once | SUBCUTANEOUS | Status: AC
  Administered 2023-04-20: 6 mg via SUBCUTANEOUS
  Filled 2023-04-20: qty 0.6

## 2023-04-20 MED ORDER — SODIUM CHLORIDE 0.9% FLUSH
10.0000 mL | INTRAVENOUS | Status: DC | PRN
Start: 2023-04-20 — End: 2023-04-20
  Administered 2023-04-20: 10 mL via INTRAVENOUS

## 2023-04-20 MED ORDER — METHYLPREDNISOLONE SODIUM SUCC 125 MG IJ SOLR
125.0000 mg | Freq: Once | INTRAMUSCULAR | Status: AC
  Administered 2023-04-20: 125 mg via INTRAVENOUS
  Filled 2023-04-20: qty 2

## 2023-04-20 MED ORDER — SODIUM CHLORIDE 0.9 % IV SOLN
950.0000 mg | Freq: Once | INTRAVENOUS | Status: AC
  Administered 2023-04-20: 950 mg via INTRAVENOUS
  Filled 2023-04-20: qty 19

## 2023-04-20 MED ORDER — CETIRIZINE HCL 10 MG/ML IV SOLN
10.0000 mg | Freq: Once | INTRAVENOUS | Status: AC
  Administered 2023-04-20: 10 mg via INTRAVENOUS
  Filled 2023-04-20: qty 1

## 2023-04-20 MED ORDER — HEPARIN SOD (PORK) LOCK FLUSH 100 UNIT/ML IV SOLN
500.0000 [IU] | Freq: Once | INTRAVENOUS | Status: AC
  Administered 2023-04-20: 500 [IU] via INTRAVENOUS

## 2023-04-20 MED ORDER — SODIUM CHLORIDE 0.9 % IV SOLN: INTRAVENOUS | Status: DC

## 2023-04-20 MED ORDER — FAMOTIDINE IN NACL 20-0.9 MG/50ML-% IV SOLN
20.0000 mg | Freq: Once | INTRAVENOUS | Status: AC
  Administered 2023-04-20: 20 mg via INTRAVENOUS
  Filled 2023-04-20: qty 50

## 2023-04-20 MED ORDER — SODIUM CHLORIDE 0.9 % IV SOLN
50.0000 mg | Freq: Once | INTRAVENOUS | Status: AC
  Administered 2023-04-20: 50 mg via INTRAVENOUS
  Filled 2023-04-20: qty 1

## 2023-04-20 NOTE — Patient Instructions (Signed)
 [facility] [place] - A DEPT OF [facility]  Discharge Instructions: Thank you for choosing [facility] to provide your oncology and hematology care.  If you have a lab appointment with the Cancer Center - please note that after April 8th, 2024, all labs will be drawn in the cancer center.  You do not have to check in or register with the main entrance as you have in the past but will complete your check-in in the cancer center.  Wear comfortable clothing and clothing appropriate for easy access to any Portacath or PICC line.   We strive to give you quality time with your provider. You may need to reschedule your appointment if you arrive late (15 or more minutes).  Arriving late affects you and other patients whose appointments are after yours.  Also, if you miss three or more appointments without notifying the office, you may be dismissed from the clinic at the provider's discretion.      For prescription refill requests, have your pharmacy contact our office and allow 72 hours for refills to be completed.    Today you received Infed and Udenyca injection     BELOW ARE SYMPTOMS THAT SHOULD BE REPORTED IMMEDIATELY: *FEVER GREATER THAN 100.4 F (38 C) OR HIGHER *CHILLS OR SWEATING *NAUSEA AND VOMITING THAT IS NOT CONTROLLED WITH YOUR NAUSEA MEDICATION *UNUSUAL SHORTNESS OF BREATH *UNUSUAL BRUISING OR BLEEDING *URINARY PROBLEMS (pain or burning when urinating, or frequent urination) *BOWEL PROBLEMS (unusual diarrhea, constipation, pain near the anus) TENDERNESS IN MOUTH AND THROAT WITH OR WITHOUT PRESENCE OF ULCERS (sore throat, sores in mouth, or a toothache) UNUSUAL RASH, SWELLING OR PAIN  UNUSUAL VAGINAL DISCHARGE OR ITCHING   Items with * indicate a potential emergency and should be followed up as soon as possible or go to the Emergency Department if any problems should occur.  Please show the CHEMOTHERAPY ALERT CARD or IMMUNOTHERAPY ALERT CARD at  check-in to the Emergency Department and triage nurse.  Should you have questions after your visit or need to cancel or reschedule your appointment, please contact [facility] [place] - A DEPT OF [facility]igh[facility]ideg[facility] [phone]  and follow the prompts.  Office hours are 8:00 a.m. to 4:30 p.m. Monday - Friday. Please note that voicemails left after 4:00 p.m. may not be returned until the following business day.  We are closed weekends and major holidays. You have access to a nurse at all times for urgent questions. Please call the main number to the clinic [phone] and follow the prompts.  For any non-urgent questions, you may also contact your provider using MyChart. We now offer e-Visits for anyone 32 and older to request care online for non-urgent symptoms. For details visit [email or web address].   Also download the MyChart app! Go to the app store, search "MyChart", open the app, select [place], and log in with your MyChart username and password.

## 2023-04-20 NOTE — Progress Notes (Signed)
 Patient presents today for iron infusion and Udenyca injection.  Patient is in satisfactory condition with no new complaints voiced.  Vital signs are stable.  We will proceed with infusion per provider orders.    Infed 1,000 mg given today per MD orders. Tolerated infusion without adverse affects. Vital signs stable. No complaints at this time. Discharged from clinic via wheelchair in stable condition. Alert and oriented x 3. F/U with [facility] as scheduled.

## 2023-04-21 ENCOUNTER — Inpatient Hospital Stay: Payer: Medicare Other

## 2023-04-25 DIAGNOSIS — I5032 Chronic diastolic (congestive) heart failure: Secondary | ICD-10-CM | POA: Diagnosis not present

## 2023-04-25 DIAGNOSIS — C9002 Multiple myeloma in relapse: Secondary | ICD-10-CM | POA: Diagnosis not present

## 2023-04-27 ENCOUNTER — Ambulatory Visit: Payer: Medicare Other | Admitting: Hematology

## 2023-04-27 ENCOUNTER — Other Ambulatory Visit: Payer: Medicare Other

## 2023-04-27 ENCOUNTER — Ambulatory Visit: Payer: Medicare Other

## 2023-05-09 ENCOUNTER — Telehealth: Payer: Self-pay | Admitting: *Deleted

## 2023-05-09 NOTE — Telephone Encounter (Signed)
 Son Leslie Williamson would like to push her treatment out till next week. She developed a moderate cough and sore throat yesterday. No fever at this time. I recommended a COVID/Flu test and for son to call me back if positive so we could treat if needed.  Treatment will be rescheduled and Dr. Ellin Saba made aware.

## 2023-05-10 ENCOUNTER — Other Ambulatory Visit: Payer: Self-pay | Admitting: *Deleted

## 2023-05-10 ENCOUNTER — Inpatient Hospital Stay: Payer: Medicare Other

## 2023-05-10 ENCOUNTER — Inpatient Hospital Stay: Payer: Medicare Other | Admitting: Hematology

## 2023-05-10 MED ORDER — PAXLOVID (150/100) 10 X 150 MG & 10 X 100MG PO TBPK: 2.0000 | ORAL_TABLET | Freq: Two times a day (BID) | ORAL | 0 refills | Status: AC

## 2023-05-10 MED ORDER — PAXLOVID (150/100) 10 X 150 MG & 10 X 100MG PO TBPK: 2.0000 | ORAL_TABLET | Freq: Two times a day (BID) | ORAL | 0 refills | Status: DC

## 2023-05-10 NOTE — Telephone Encounter (Signed)
 Patient's son called to advise that she did test positive for COVID.  Per Dr. Ellin Saba, Paxlovid, renal dosing sent to her pharmacy and treatment will be rescheduled.  Verbalized understanding.

## 2023-05-11 ENCOUNTER — Other Ambulatory Visit: Payer: Self-pay

## 2023-05-12 ENCOUNTER — Inpatient Hospital Stay: Payer: Medicare Other

## 2023-05-18 ENCOUNTER — Inpatient Hospital Stay: Attending: Hematology

## 2023-05-18 ENCOUNTER — Inpatient Hospital Stay: Admitting: Hematology

## 2023-05-18 ENCOUNTER — Inpatient Hospital Stay

## 2023-05-18 VITALS — Wt 121.6 lb

## 2023-05-18 VITALS — BP 130/63 | HR 68 | Temp 97.2°F | Resp 18

## 2023-05-18 DIAGNOSIS — I700 Atherosclerosis of aorta: Secondary | ICD-10-CM | POA: Insufficient documentation

## 2023-05-18 DIAGNOSIS — Z7901 Long term (current) use of anticoagulants: Secondary | ICD-10-CM | POA: Diagnosis not present

## 2023-05-18 DIAGNOSIS — D631 Anemia in chronic kidney disease: Secondary | ICD-10-CM | POA: Insufficient documentation

## 2023-05-18 DIAGNOSIS — E7800 Pure hypercholesterolemia, unspecified: Secondary | ICD-10-CM | POA: Diagnosis not present

## 2023-05-18 DIAGNOSIS — C8335 Diffuse large B-cell lymphoma, lymph nodes of inguinal region and lower limb: Secondary | ICD-10-CM

## 2023-05-18 DIAGNOSIS — C9000 Multiple myeloma not having achieved remission: Secondary | ICD-10-CM

## 2023-05-18 DIAGNOSIS — N189 Chronic kidney disease, unspecified: Secondary | ICD-10-CM | POA: Insufficient documentation

## 2023-05-18 DIAGNOSIS — I739 Peripheral vascular disease, unspecified: Secondary | ICD-10-CM | POA: Diagnosis not present

## 2023-05-18 DIAGNOSIS — Z86718 Personal history of other venous thrombosis and embolism: Secondary | ICD-10-CM | POA: Diagnosis not present

## 2023-05-18 DIAGNOSIS — Z79624 Long term (current) use of inhibitors of nucleotide synthesis: Secondary | ICD-10-CM | POA: Insufficient documentation

## 2023-05-18 DIAGNOSIS — D509 Iron deficiency anemia, unspecified: Secondary | ICD-10-CM | POA: Insufficient documentation

## 2023-05-18 DIAGNOSIS — C83398 Diffuse large b-cell lymphoma of other extranodal and solid organ sites: Secondary | ICD-10-CM | POA: Diagnosis not present

## 2023-05-18 DIAGNOSIS — I130 Hypertensive heart and chronic kidney disease with heart failure and stage 1 through stage 4 chronic kidney disease, or unspecified chronic kidney disease: Secondary | ICD-10-CM | POA: Diagnosis not present

## 2023-05-18 DIAGNOSIS — Z5189 Encounter for other specified aftercare: Secondary | ICD-10-CM | POA: Diagnosis not present

## 2023-05-18 DIAGNOSIS — Z5111 Encounter for antineoplastic chemotherapy: Secondary | ICD-10-CM | POA: Diagnosis not present

## 2023-05-18 DIAGNOSIS — D75839 Thrombocytosis, unspecified: Secondary | ICD-10-CM | POA: Diagnosis not present

## 2023-05-18 DIAGNOSIS — Z7961 Long term (current) use of immunomodulator: Secondary | ICD-10-CM | POA: Diagnosis not present

## 2023-05-18 DIAGNOSIS — E876 Hypokalemia: Secondary | ICD-10-CM | POA: Insufficient documentation

## 2023-05-18 DIAGNOSIS — Z8616 Personal history of COVID-19: Secondary | ICD-10-CM | POA: Insufficient documentation

## 2023-05-18 LAB — CBC WITH DIFFERENTIAL/PLATELET
Abs Immature Granulocytes: 0.01 10*3/uL (ref 0.00–0.07)
Basophils Absolute: 0 10*3/uL (ref 0.0–0.1)
Basophils Relative: 0 %
Eosinophils Absolute: 0 10*3/uL (ref 0.0–0.5)
Eosinophils Relative: 0 %
HCT: 33.5 % — ABNORMAL LOW (ref 36.0–46.0)
Hemoglobin: 10.3 g/dL — ABNORMAL LOW (ref 12.0–15.0)
Immature Granulocytes: 0 %
Lymphocytes Relative: 28 %
Lymphs Abs: 1.7 10*3/uL (ref 0.7–4.0)
MCH: 29.6 pg (ref 26.0–34.0)
MCHC: 30.7 g/dL (ref 30.0–36.0)
MCV: 96.3 fL (ref 80.0–100.0)
Monocytes Absolute: 0.9 10*3/uL (ref 0.1–1.0)
Monocytes Relative: 14 %
Neutro Abs: 3.6 10*3/uL (ref 1.7–7.7)
Neutrophils Relative %: 58 %
Platelets: 414 10*3/uL — ABNORMAL HIGH (ref 150–400)
RBC: 3.48 MIL/uL — ABNORMAL LOW (ref 3.87–5.11)
RDW: 28.5 % — ABNORMAL HIGH (ref 11.5–15.5)
Smear Review: INCREASED
WBC: 6.2 10*3/uL (ref 4.0–10.5)
nRBC: 0.8 % — ABNORMAL HIGH (ref 0.0–0.2)

## 2023-05-18 LAB — COMPREHENSIVE METABOLIC PANEL
ALT: 10 U/L (ref 0–44)
AST: 15 U/L (ref 15–41)
Albumin: 3.4 g/dL — ABNORMAL LOW (ref 3.5–5.0)
Alkaline Phosphatase: 59 U/L (ref 38–126)
Anion gap: 8 (ref 5–15)
BUN: 16 mg/dL (ref 8–23)
CO2: 24 mmol/L (ref 22–32)
Calcium: 8.9 mg/dL (ref 8.9–10.3)
Chloride: 109 mmol/L (ref 98–111)
Creatinine, Ser: 0.94 mg/dL (ref 0.44–1.00)
GFR, Estimated: 58 mL/min — ABNORMAL LOW (ref >60)
Glucose, Bld: 100 mg/dL — ABNORMAL HIGH (ref 70–99)
Potassium: 3.5 mmol/L (ref 3.5–5.1)
Sodium: 141 mmol/L (ref 135–145)
Total Bilirubin: 0.4 mg/dL (ref 0.0–1.2)
Total Protein: 5.5 g/dL — ABNORMAL LOW (ref 6.5–8.1)

## 2023-05-18 LAB — MAGNESIUM: Magnesium: 2 mg/dL (ref 1.7–2.4)

## 2023-05-18 LAB — SAMPLE TO BLOOD BANK

## 2023-05-18 LAB — URIC ACID: Uric Acid, Serum: 6.2 mg/dL (ref 2.5–7.1)

## 2023-05-18 MED ORDER — FOSAPREPITANT DIMEGLUMINE INJECTION 150 MG
150.0000 mg | Freq: Once | INTRAVENOUS | Status: AC
  Administered 2023-05-18: 150 mg via INTRAVENOUS
  Filled 2023-05-18: qty 150

## 2023-05-18 MED ORDER — ACETAMINOPHEN 325 MG PO TABS
650.0000 mg | ORAL_TABLET | Freq: Once | ORAL | Status: AC
Start: 2023-05-18 — End: 2023-05-18
  Administered 2023-05-18: 650 mg via ORAL
  Filled 2023-05-18: qty 2

## 2023-05-18 MED ORDER — DOXORUBICIN HCL CHEMO IV INJECTION 2 MG/ML
25.0000 mg/m2 | Freq: Once | INTRAVENOUS | Status: AC
  Administered 2023-05-18: 38 mg via INTRAVENOUS
  Filled 2023-05-18: qty 19

## 2023-05-18 MED ORDER — SODIUM CHLORIDE 0.9 % IV SOLN
400.0000 mg/m2 | Freq: Once | INTRAVENOUS | Status: AC
  Administered 2023-05-18: 600 mg via INTRAVENOUS
  Filled 2023-05-18: qty 30

## 2023-05-18 MED ORDER — SODIUM CHLORIDE 0.9 % IV SOLN
375.0000 mg/m2 | Freq: Once | INTRAVENOUS | Status: AC
  Administered 2023-05-18: 600 mg via INTRAVENOUS
  Filled 2023-05-18: qty 50

## 2023-05-18 MED ORDER — HEPARIN SOD (PORK) LOCK FLUSH 100 UNIT/ML IV SOLN
500.0000 [IU] | Freq: Once | INTRAVENOUS | Status: AC | PRN
  Administered 2023-05-18: 500 [IU]

## 2023-05-18 MED ORDER — VINCRISTINE SULFATE CHEMO INJECTION 1 MG/ML
1.0000 mg | Freq: Once | INTRAVENOUS | Status: AC
  Administered 2023-05-18: 1 mg via INTRAVENOUS
  Filled 2023-05-18: qty 1

## 2023-05-18 MED ORDER — PALONOSETRON HCL INJECTION 0.25 MG/5ML
0.2500 mg | Freq: Once | INTRAVENOUS | Status: AC
Start: 2023-05-18 — End: 2023-05-18
  Administered 2023-05-18: 0.25 mg via INTRAVENOUS
  Filled 2023-05-18: qty 5

## 2023-05-18 MED ORDER — SODIUM CHLORIDE 0.9% FLUSH
10.0000 mL | INTRAVENOUS | Status: DC | PRN
  Administered 2023-05-18: 10 mL via INTRAVENOUS

## 2023-05-18 MED ORDER — DEXAMETHASONE SODIUM PHOSPHATE 10 MG/ML IJ SOLN
10.0000 mg | Freq: Once | INTRAMUSCULAR | Status: AC
  Administered 2023-05-18: 10 mg via INTRAVENOUS
  Filled 2023-05-18: qty 1

## 2023-05-18 MED ORDER — SODIUM CHLORIDE 0.9% FLUSH
10.0000 mL | INTRAVENOUS | Status: DC | PRN
  Administered 2023-05-18: 10 mL

## 2023-05-18 MED ORDER — CETIRIZINE HCL 10 MG/ML IV SOLN
10.0000 mg | Freq: Once | INTRAVENOUS | Status: AC
  Administered 2023-05-18: 10 mg via INTRAVENOUS
  Filled 2023-05-18: qty 1

## 2023-05-18 MED ORDER — SODIUM CHLORIDE 0.9 % IV SOLN: INTRAVENOUS | Status: DC

## 2023-05-18 MED ORDER — FAMOTIDINE IN NACL 20-0.9 MG/50ML-% IV SOLN
20.0000 mg | Freq: Once | INTRAVENOUS | Status: AC
  Administered 2023-05-18: 20 mg via INTRAVENOUS
  Filled 2023-05-18: qty 50

## 2023-05-18 NOTE — Progress Notes (Signed)
 May proceed with tx prior to [facility] resulting since WBC are WNL per Chapman Moss, RN/Dr. Ellin Saba.  Drusilla Kanner, PharmD, MBA

## 2023-05-18 NOTE — Patient Instructions (Signed)

## 2023-05-18 NOTE — Progress Notes (Signed)
 Patient presents today for [facility] infusion per providers order.  Vital signs and labs reviewed by MD.  Message received from Chapman Moss RN/Dr. Ellin Saba patient okay for treatment.    Treatment given today per MD orders.  Stable during infusion without adverse affects.  Vital signs stable.  No complaints at this time.  Discharge from clinic via wheelchair in stable condition.  Alert and oriented X 3.  Follow up with [facility] as scheduled.

## 2023-05-18 NOTE — Progress Notes (Signed)
 [facility] [address] [place] [address]    Clinic Day:  05/18/2023  Referring physician: Benetta Spar*  Patient Care Team: Benetta Spar, MD as PCP - General (Internal Medicine) Wyline Mood Dorothe Pea, MD as PCP - Cardiology (Cardiology) Doreatha Massed, MD as Medical Oncologist (Medical Oncology)   ASSESSMENT & PLAN:   Assessment: 1.  Biclonal IgG lambda and IgM kappa multiple myeloma, high risk: - BMBX (11/02/2021): IgG lambda plasma cells 70% - Myeloma FISH panel: Monosomy 13 (standard GIST), duplication of 1 q. (high risk), gain of 11 q. (standard risk). - Cytogenetics: Hyperdiploid E weight gain (trisomy/trisomy) of odd-numbered chromosomes and monosomy 13, standard risk.  Gain of 1 q. associated with progressive course. - PET scan (11/05/2021): No evidence of multiple myeloma.  No hypermetabolic adenopathy. - Daratumumab, Revlimid and dexamethasone cycle 1 started on 11/30/2021   2.  JAK2 positive essential thrombocytosis: - BMBX (11/02/2021): No evidence of fibrosis.  No morphological evidence of JAK2 positive ET.   3.  History of marginal zone lymphoma of the spleen: - Diagnosed in 1999, status post CHOP x7 cycles followed by rituximab 1 cycle. - Status post splenectomy.  No evidence of lymphoma on current PET scan.   4.  Social/family history: - She lives with her sister.  Son lives 30 minutes away.  She is independent of ADLs and some IADLs.  She drives.  She worked at [employer] and denies any exposure to chemicals or pesticides.  No family history of malignancies.  5.  Bone protection (DEXA scan 02/17/2022 T-score -0.9): - She saw dentist who recommended dental extractions.  She does not want zoledronic acid or denosumab.  6.  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 - Initial needle biopsy on  01/19/2023 showed malignant hematopoietic neoplasm. - Excision biopsy on 02/18/2023: Diffuse large B-cell lymphoma. - Cycle 1 of R mini CHOP on 03/30/2023    Plan: 1.  IgG lambda multiple myeloma: - Revlimid and Darzalex are on hold while we are treating lymphoma.  Will resume myeloma therapy after completion of lymphoma chemotherapy.  Last M spike was 0.1 g in January.   2.  Anemia from CKD and iron deficiency: - She received INFeD on 04/20/2023. - Hemoglobin improved to 10.3 from 8.0 at last visit.  Will continue monitoring of her iron levels.   3.  Bilateral leg swellings: - Continue Lasix 20 mg daily as needed.  She is requiring 3 times a week.   4.  Pulmonary embolism (Dx 07/03/2022):: - Continue Eliquis twice daily indefinitely.  No bleeding issues.   5.  Hypokalemia: - Continue potassium 40 mEq daily.  Potassium is 3.5.  6.  Diffuse large B-cell lymphoma of leg type, right leg: - She received cycle 2 of R mini CHOP on 04/19/2023. - She was diagnosed with COVID 2 weeks ago with congestion, running nose and cough.  Symptoms have improved. - She had constipation after last treatment.  Recommend that she start taking stool softener and MiraLAX 1-2 times daily starting today. - Reviewed labs today: Normal LFTs with albumin 3.4.  CBC is grossly normal. - Right leg mass: Has decreased in size but slightly tender with no sign of infection. - Recommend proceeding with cycle 3 today.  Will obtain whole-body PET scan prior to next visit.    Orders Placed This  Encounter  Procedures   NM PET Image Restage (PS) Whole Body    Standing Status:   Future    Expected Date:   06/01/2023    Expiration Date:   05/17/2024    If indicated for the ordered procedure, I authorize the administration of a radiopharmaceutical per Radiology protocol:   Yes    Preferred imaging location?:   [facility]     I,Helena R Teague,acting as a scribe for Doreatha Massed, MD.,have documented all relevant  documentation on the behalf of Doreatha Massed, MD,as directed by  Doreatha Massed, MD while in the presence of Doreatha Massed, MD.  I, Doreatha Massed MD, have reviewed the above documentation for accuracy and completeness, and I agree with the above.      Doreatha Massed, MD   3/26/202510:12 AM  CHIEF COMPLAINT:   Diagnosis: multiple myeloma, large B-cell lymphoma of leg type   Cancer Staging  Multiple myeloma (HCC) Staging form: Plasma Cell Myeloma and Plasma Cell Disorders, AJCC 8th Edition - Clinical stage from 11/16/2021: Albumin (g/dL): 3.1, ISS: Stage II, High-risk cytogenetics: Absent, LDH: Normal - Unsigned  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) Staging form: Primary Cutaneous B-Cell/T-Cell Lymphoma (Non-MF/SS Lymphoma), AJCC 8th Edition - Clinical stage from 03/01/2023: cT1a, cN0, cM0 - Unsigned    Prior Therapy: Daratumumab, Revlimid and dexamethasone  Current Therapy: R mini CHOP   HISTORY OF PRESENT ILLNESS:   Oncology History  NHL (non-Hodgkin's lymphoma) (HCC)  07/19/1995 Pathology Results   Spleen biopsy with resection- low-grade B-cell lymphoma, splenic marginal zone type   02/20/1998 Imaging   CT CAP-marked and extensive cervical adenopathy   02/25/1998 Pathology Results   Right cervical lymph node demonstrating large B-cell lymphoma   03/05/1998 Bone Marrow Biopsy   No evidence of bone marrow involvement   03/11/1998 - 07/18/1998 Chemotherapy   CHOP x 7 cycles   05/09/1998 Imaging   CT CAP and neck- slight interval decrease in size of para-aortic adenopathy with interval decrease in right inguinal and bi-iliac adenopathy.  Interval decrease in size and number of enlarged cervical nodes   07/10/1998 Imaging   Ct CAP and neck- unremarkable CT of chest. No enlarged retroperitoneal adenopathy withthe previously noted retroperitoneal nodes currently smaller to stable in size.  Cervical adenopathy is  stable to slightly smaller in size.   08/18/1998 - 09/08/1998 Chemotherapy   Rituxan Day 1, 8, 15, 22 x 1 cycle   01/08/1999 Remission   CT CAP and neck demonstrates no adenopathy or disease   01/08/1999 Imaging   CT CAP and neck- Stable CT of chest. Stable CT abd/pelvis without adenopathy.  Negative CT of neck   Multiple myeloma (HCC)  11/16/2021 Initial Diagnosis   Multiple myeloma (HCC)   11/30/2021 - 03/02/2023 Chemotherapy   Patient is on Treatment Plan : R-CHOP 03/29/23 - MYELOMA  Daratumumab SQ + Lenalidomide + Dexamethasone (DaraRd) q28d     03/29/2023 -  Chemotherapy   Patient is on Treatment Plan : NON-HODGKINS LYMPHOMA R-CHOP q21d x 4 cycles        INTERVAL HISTORY:   Forest is a 89 y.o. female seen for follow-up of multiple myeloma.  She was last seen by me on 04/19/23.  Today, she states that she is doing well overall. Her appetite level is at 100%. Her energy level is at 90%.   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)    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   Vertigo, labyrinthine     Surgical History: Past Surgical History:  Procedure Laterality Date   CATARACT EXTRACTION W/PHACO Left 11/03/2015   Procedure: CATARACT EXTRACTION PHACO AND INTRAOCULAR LENS PLACEMENT (IOC);  Surgeon: Gemma Payor, MD;  Location: [facility];  Service: Ophthalmology;  Laterality: Left;  CDE: 7.74    CATARACT EXTRACTION W/PHACO Right 11/20/2015   Procedure: CATARACT EXTRACTION PHACO AND INTRAOCULAR LENS PLACEMENT ; CDE:  8.30;  Surgeon: Gemma Payor, MD;  Location: [facility];  Service: Ophthalmology;  Laterality: Right;   IR IMAGING GUIDED PORT INSERTION  11/04/2021   MASS EXCISION Right 02/18/2023   Procedure: Incisional Biopsy Right Lower Extremity;  Surgeon: Lucretia Roers, MD;  Location: [facility];  Service: General;  Laterality: Right;   PORT-A-CATH REMOVAL      Social History: Social History   Socioeconomic History   Marital status: Widowed    Spouse name: Not on file   Number of children: Not on file   Years of education: Not on file   Highest education level: Not on file  Occupational History   Not on file  Tobacco Use   Smoking status: Former   Smokeless tobacco: Never  Vaping Use   Vaping status: Never Used  Substance and Sexual Activity   Alcohol use: No   Drug use: No   Sexual activity: Not Currently  Other Topics Concern   Not on file  Social History Narrative   Not on file   Social Drivers of Health   Financial Resource Strain: Low Risk  (12/31/2019)   Overall Financial Resource Strain (CARDIA)    Difficulty of Paying Living Expenses: Not hard at all  Food Insecurity: No Food Insecurity (07/03/2022)   Hunger Vital Sign    Worried About Running Out of Food in the Last Year: Never true    Ran Out of Food in the Last Year: Never true  Transportation Needs: No Transportation Needs (07/03/2022)   PRAPARE - Administrator, Civil Service (Medical): No    Lack of Transportation (Non-Medical): No  Physical Activity: Inactive (12/31/2019)   Exercise Vital Sign    Days of Exercise per Week: 0 days    Minutes of Exercise per Session: 0 min  Stress: No Stress Concern Present (12/31/2019)   [employer] of Occupational Health - Occupational Stress Questionnaire    Feeling of Stress : Not at all  Social Connections: Moderately Isolated (12/31/2019)    Social Connection and Isolation Panel [NHANES]    Frequency of Communication with Friends and Family: More than three times a week    Frequency of Social Gatherings with Friends and Family: More than three times a week    Attends Religious Services: More than 4 times per year    Active Member of [employer] or Organizations: No    Attends [employer]  or Organization Meetings: Never    Marital Status: Widowed  Intimate Partner Violence: Not At Risk (07/03/2022)   Humiliation, Afraid, Rape, and Kick questionnaire    Fear of Current or Ex-Partner: No    Emotionally Abused: No    Physically Abused: No    Sexually Abused: No    Family History: Family History  Problem Relation Age of Onset   Diabetes Mother     Current Medications:  Current Outpatient Medications:    acetaminophen (TYLENOL) 500 MG tablet, Take 1,000 mg by mouth every 6 (six) hours as needed for moderate pain., Disp: , Rfl:    acyclovir (ZOVIRAX) 400 MG tablet, Take 1 tablet (400 mg total) by mouth 2 (two) times daily., Disp: 60 tablet, Rfl: 6   allopurinol (ZYLOPRIM) 300 MG tablet, Take 1 tablet (300 mg total) by mouth daily., Disp: 30 tablet, Rfl: 0   apixaban (ELIQUIS) 5 MG TABS tablet, Take 1 tablet (5 mg total) by mouth 2 (two) times daily., Disp: 60 tablet, Rfl: 1   atorvastatin (LIPITOR) 40 MG tablet, Take 40 mg by mouth at bedtime., Disp: , Rfl:    CYCLOPHOSPHAMIDE IV, Inject into the vein every 21 ( twenty-one) days., Disp: , Rfl:    dapagliflozin propanediol (FARXIGA) 10 MG TABS tablet, Take 1 tablet (10 mg total) by mouth daily., Disp: 30 tablet, Rfl: 1   dexamethasone (DECADRON) 4 MG tablet, Take 10 mg (2.5 tablets) by mouth weekly every Wednesday morning Per note by Dr. Ellin Saba on 09/15/2022, Disp: 20 tablet, Rfl: 6   DOXOrubicin HCl (ADRIAMYCIN IV), Inject into the vein every 21 ( twenty-one) days., Disp: , Rfl:    furosemide (LASIX) 20 MG tablet, Take 1 tablet (20 mg total) by mouth every morning., Disp: 30 tablet, Rfl:  3   lenalidomide (REVLIMID) 10 MG capsule, Take 1 capsule (10 mg total) by mouth daily. Take for 21 days on, 7 days off., Disp: 21 capsule, Rfl: 0   lidocaine-prilocaine (EMLA) cream, Apply 1 Application topically as needed., Disp: 30 g, Rfl: 1   lidocaine-prilocaine (EMLA) cream, Apply to affected area once, Disp: 30 g, Rfl: 3   losartan (COZAAR) 25 MG tablet, Take 12.5 mg by mouth daily., Disp: , Rfl:    metoprolol succinate (TOPROL-XL) 25 MG 24 hr tablet, Take 0.5 tablets (12.5 mg total) by mouth daily., Disp: 30 tablet, Rfl: 0   ondansetron (ZOFRAN) 4 MG tablet, Take 1 tablet (4 mg total) by mouth every 8 (eight) hours as needed., Disp: 30 tablet, Rfl: 1   oxyCODONE (ROXICODONE) 5 MG immediate release tablet, Take 1 tablet (5 mg total) by mouth every 4 (four) hours as needed for severe pain (pain score 7-10) or breakthrough pain., Disp: 5 tablet, Rfl: 0   potassium chloride SA (KLOR-CON M20) 20 MEQ tablet, Take 1 tablet on the days you take lasix (Patient taking differently: Take 20 mEq by mouth 2 (two) times daily.), Disp: 180 tablet, Rfl: 3   predniSONE (DELTASONE) 20 MG tablet, Take 3 tablets (60 mg total) by mouth daily. Take with food on days 1-5 of chemotherapy., Disp: 25 tablet, Rfl: 5   predniSONE (DELTASONE) 5 MG tablet, Take 5 mg by mouth daily., Disp: , Rfl:    prochlorperazine (COMPAZINE) 10 MG tablet, Take 1 tablet (10 mg total) by mouth every 6 (six) hours as needed for nausea or vomiting., Disp: 30 tablet, Rfl: 6   promethazine-dextromethorphan (PROMETHAZINE-DM) 6.25-15 MG/5ML syrup, Take 5 mLs by mouth every 6 (six) hours as needed.,  Disp: , Rfl:    riTUXimab (RITUXAN IV), Inject into the vein every 21 ( twenty-one) days., Disp: , Rfl:    VINCRISTINE SULFATE IV, Inject into the vein every 21 ( twenty-one) days., Disp: , Rfl:  No current facility-administered medications for this visit.  Facility-Administered Medications Ordered in Other Visits:    0.9 %  sodium chloride  infusion, , Intravenous, Continuous, Doreatha Massed, MD, Stopped at 03/29/23 1333   0.9 %  sodium chloride infusion, , Intravenous, Continuous, Doreatha Massed, MD   acetaminophen (TYLENOL) tablet 650 mg, 650 mg, Oral, Once, Doreatha Massed, MD   cetirizine (QUZYTTIR) injection 10 mg, 10 mg, Intravenous, Once, Doreatha Massed, MD   cyclophosphamide (CYTOXAN) 600 mg in sodium chloride 0.9 % 250 mL chemo infusion, 400 mg/m2 (Treatment Plan Recorded), Intravenous, Once, Doreatha Massed, MD   dexamethasone (DECADRON) injection 10 mg, 10 mg, Intravenous, Once, Doreatha Massed, MD   DOXOrubicin (ADRIAMYCIN) chemo injection 38 mg, 25 mg/m2 (Treatment Plan Recorded), Intravenous, Once, Doreatha Massed, MD   famotidine (PEPCID) IVPB 20 mg premix, 20 mg, Intravenous, Once, Doreatha Massed, MD   fosaprepitant (EMEND) 150 mg in sodium chloride 0.9 % 145 mL IVPB, 150 mg, Intravenous, Once, Doreatha Massed, MD   heparin lock flush 100 unit/mL, 500 Units, Intracatheter, Once PRN, Doreatha Massed, MD   palonosetron (ALOXI) injection 0.25 mg, 0.25 mg, Intravenous, Once, Doreatha Massed, MD   riTUXimab-pvvr (RUXIENCE) 600 mg in sodium chloride 0.9 % 250 mL (1.9355 mg/mL) infusion, 375 mg/m2 (Treatment Plan Recorded), Intravenous, Once, Doreatha Massed, MD   sodium chloride flush (NS) 0.9 % injection 10 mL, 10 mL, Intracatheter, PRN, Doreatha Massed, MD, 10 mL at 03/29/23 1336   sodium chloride flush (NS) 0.9 % injection 10 mL, 10 mL, Intracatheter, PRN, Doreatha Massed, MD   vinCRIStine (ONCOVIN) 1 mg in sodium chloride 0.9 % 50 mL chemo infusion, 1 mg, Intravenous, Once, Doreatha Massed, MD   Allergies: Allergies  Allergen Reactions   Penicillins Rash    REVIEW OF SYSTEMS:   Review of Systems  Constitutional:  Negative for chills, fatigue and fever.  HENT:   Negative for lump/mass, mouth sores, nosebleeds, sore throat and trouble  swallowing.   Eyes:  Negative for eye problems.  Respiratory:  Negative for cough and shortness of breath.   Cardiovascular:  Negative for chest pain, leg swelling and palpitations.  Gastrointestinal:  Positive for constipation and nausea. Negative for abdominal pain, diarrhea and vomiting.  Genitourinary:  Negative for bladder incontinence, difficulty urinating, dysuria, frequency, hematuria and nocturia.   Musculoskeletal:  Negative for arthralgias, back pain, flank pain, myalgias and neck pain.  Skin:  Negative for itching and rash.  Neurological:  Positive for dizziness and numbness. Negative for headaches.  Hematological:  Does not bruise/bleed easily.  Psychiatric/Behavioral:  Negative for depression, sleep disturbance and suicidal ideas. The patient is not nervous/anxious.   All other systems reviewed and are negative.    VITALS:   Weight 121 lb 9.6 oz (55.2 kg).  Wt Readings from Last 3 Encounters:  05/18/23 121 lb 9.6 oz (55.2 kg)  04/19/23 121 lb 11.1 oz (55.2 kg)  04/05/23 125 lb 14.1 oz (57.1 kg)    Body mass index is 23.75 kg/m.  Performance status (ECOG): 1 - Symptomatic but completely ambulatory  PHYSICAL EXAM:   Physical Exam Vitals and nursing note reviewed. Exam conducted with a chaperone present.  Constitutional:      Appearance: Normal appearance.  Cardiovascular:     Rate and Rhythm: Normal  rate and regular rhythm.     Pulses: Normal pulses.     Heart sounds: Normal heart sounds.  Pulmonary:     Effort: Pulmonary effort is normal.     Breath sounds: Normal breath sounds.  Abdominal:     Palpations: Abdomen is soft. There is no hepatomegaly, splenomegaly or mass.     Tenderness: There is no abdominal tenderness.  Musculoskeletal:     Right lower leg: No edema.     Left lower leg: No edema.  Lymphadenopathy:     Cervical: No cervical adenopathy.     Right cervical: No superficial, deep or posterior cervical adenopathy.    Left cervical: No  superficial, deep or posterior cervical adenopathy.     Upper Body:     Right upper body: No supraclavicular or axillary adenopathy.     Left upper body: No supraclavicular or axillary adenopathy.  Neurological:     General: No focal deficit present.     Mental Status: She is alert and oriented to person, place, and time.  Psychiatric:        Mood and Affect: Mood normal.        Behavior: Behavior normal.     LABS:      Latest Ref Rng & Units 05/18/2023    9:17 AM 04/19/2023    8:20 AM 04/05/2023   10:09 AM  CBC  WBC 4.0 - 10.5 K/uL 6.2  8.7  8.6   Hemoglobin 12.0 - 15.0 g/dL 16.1  8.0  8.4   Hematocrit 36.0 - 46.0 % 33.5  25.8  27.1   Platelets 150 - 400 K/uL 414  664  275       Latest Ref Rng & Units 05/18/2023    9:17 AM 04/19/2023    8:20 AM 04/05/2023   10:09 AM  CMP  Glucose 70 - 99 mg/dL 096  045  99   BUN 8 - 23 mg/dL 16  21  15    Creatinine 0.44 - 1.00 mg/dL 4.09  8.11  9.14   Sodium 135 - 145 mmol/L 141  140  140   Potassium 3.5 - 5.1 mmol/L 3.5  3.9  3.9   Chloride 98 - 111 mmol/L 109  108  111   CO2 22 - 32 mmol/L 24  23  23    Calcium 8.9 - 10.3 mg/dL 8.9  8.8  8.0   Total Protein 6.5 - 8.1 g/dL 5.5  5.4  4.9   Total Bilirubin 0.0 - 1.2 mg/dL 0.4  0.4  0.5   Alkaline Phos 38 - 126 U/L 59  70  101   AST 15 - 41 U/L 15  13  11    ALT 0 - 44 U/L 10  9  7       No results found for: "CEA1", "CEA" / No results found for: "CEA1", "CEA" No results found for: "PSA1" No results found for: "CAN199" No results found for: "CAN125"  Lab Results  Component Value Date   TOTALPROTELP 5.4 (L) 03/02/2023   ALBUMINELP 3.1 03/02/2023   A1GS 0.3 03/02/2023   A2GS 0.7 03/02/2023   BETS 1.0 03/02/2023   GAMS 0.4 03/02/2023   MSPIKE 0.1 (H) 03/02/2023   SPEI Comment 03/02/2023   Lab Results  Component Value Date   TIBC 358 04/19/2023   TIBC 364 03/16/2023   TIBC 375 09/15/2022   FERRITIN 10 (L) 04/19/2023   FERRITIN 6 (L) 03/16/2023   FERRITIN 7 (L) 09/15/2022    IRONPCTSAT  4 (L) 04/19/2023   IRONPCTSAT 12 03/16/2023   IRONPCTSAT 5 (L) 09/15/2022   Lab Results  Component Value Date   LDH 122 03/02/2023   LDH 154 09/30/2021   LDH 148 08/05/2021     STUDIES:   No results found.

## 2023-05-18 NOTE — Progress Notes (Signed)
 Patient has been examined by Dr. Ellin Saba. Vital signs and labs have been reviewed by MD - ANC, Creatinine, LFTs, hemoglobin, and platelets are within treatment parameters per M.D. - pt may proceed with treatment.  Primary RN and pharmacy notified.

## 2023-05-18 NOTE — Patient Instructions (Signed)
 [facility] [place] - A DEPT OF [facility]  Discharge Instructions: Thank you for choosing [facility] to provide your oncology and hematology care.  If you have a lab appointment with the Cancer Center - please note that after April 8th, 2024, all labs will be drawn in the cancer center.  You do not have to check in or register with the main entrance as you have in the past but will complete your check-in in the cancer center.  Wear comfortable clothing and clothing appropriate for easy access to any Portacath or PICC line.   We strive to give you quality time with your provider. You may need to reschedule your appointment if you arrive late (15 or more minutes).  Arriving late affects you and other patients whose appointments are after yours.  Also, if you miss three or more appointments without notifying the office, you may be dismissed from the clinic at the provider's discretion.      For prescription refill requests, have your pharmacy contact our office and allow 72 hours for refills to be completed.    Today you received the following chemotherapy and/or immunotherapy agents R-CHOP      To help prevent nausea and vomiting after your treatment, we encourage you to take your nausea medication as directed.  BELOW ARE SYMPTOMS THAT SHOULD BE REPORTED IMMEDIATELY: *FEVER GREATER THAN 100.4 F (38 C) OR HIGHER *CHILLS OR SWEATING *NAUSEA AND VOMITING THAT IS NOT CONTROLLED WITH YOUR NAUSEA MEDICATION *UNUSUAL SHORTNESS OF BREATH *UNUSUAL BRUISING OR BLEEDING *URINARY PROBLEMS (pain or burning when urinating, or frequent urination) *BOWEL PROBLEMS (unusual diarrhea, constipation, pain near the anus) TENDERNESS IN MOUTH AND THROAT WITH OR WITHOUT PRESENCE OF ULCERS (sore throat, sores in mouth, or a toothache) UNUSUAL RASH, SWELLING OR PAIN  UNUSUAL VAGINAL DISCHARGE OR ITCHING   Items with * indicate a potential emergency and should be followed up as  soon as possible or go to the Emergency Department if any problems should occur.  Please show the CHEMOTHERAPY ALERT CARD or IMMUNOTHERAPY ALERT CARD at check-in to the Emergency Department and triage nurse.  Should you have questions after your visit or need to cancel or reschedule your appointment, please contact [facility] [place] - A DEPT OF [facility] [phone]  and follow the prompts.  Office hours are 8:00 a.m. to 4:30 p.m. Monday - Friday. Please note that voicemails left after 4:00 p.m. may not be returned until the following business day.  We are closed weekends and major holidays. You have access to a nurse at all times for urgent questions. Please call the main number to the clinic [phone] and follow the prompts.  For any non-urgent questions, you may also contact your provider using MyChart. We now offer e-Visits for anyone 7 and older to request care online for non-urgent symptoms. For details visit [email or web address].   Also download the MyChart app! Go to the app store, search "MyChart", open the app, select [place], and log in with your MyChart username and password.

## 2023-05-19 ENCOUNTER — Other Ambulatory Visit: Payer: Self-pay

## 2023-05-20 ENCOUNTER — Inpatient Hospital Stay

## 2023-05-20 VITALS — BP 129/55 | HR 52 | Temp 97.6°F | Resp 20

## 2023-05-20 DIAGNOSIS — Z79624 Long term (current) use of inhibitors of nucleotide synthesis: Secondary | ICD-10-CM | POA: Diagnosis not present

## 2023-05-20 DIAGNOSIS — D509 Iron deficiency anemia, unspecified: Secondary | ICD-10-CM | POA: Diagnosis not present

## 2023-05-20 DIAGNOSIS — N189 Chronic kidney disease, unspecified: Secondary | ICD-10-CM | POA: Diagnosis not present

## 2023-05-20 DIAGNOSIS — C83398 Diffuse large b-cell lymphoma of other extranodal and solid organ sites: Secondary | ICD-10-CM | POA: Diagnosis not present

## 2023-05-20 DIAGNOSIS — Z7901 Long term (current) use of anticoagulants: Secondary | ICD-10-CM | POA: Diagnosis not present

## 2023-05-20 DIAGNOSIS — I700 Atherosclerosis of aorta: Secondary | ICD-10-CM | POA: Diagnosis not present

## 2023-05-20 DIAGNOSIS — C9000 Multiple myeloma not having achieved remission: Secondary | ICD-10-CM

## 2023-05-20 DIAGNOSIS — D631 Anemia in chronic kidney disease: Secondary | ICD-10-CM | POA: Diagnosis not present

## 2023-05-20 DIAGNOSIS — I130 Hypertensive heart and chronic kidney disease with heart failure and stage 1 through stage 4 chronic kidney disease, or unspecified chronic kidney disease: Secondary | ICD-10-CM | POA: Diagnosis not present

## 2023-05-20 DIAGNOSIS — Z5189 Encounter for other specified aftercare: Secondary | ICD-10-CM | POA: Diagnosis not present

## 2023-05-20 DIAGNOSIS — D75839 Thrombocytosis, unspecified: Secondary | ICD-10-CM | POA: Diagnosis not present

## 2023-05-20 DIAGNOSIS — Z7961 Long term (current) use of immunomodulator: Secondary | ICD-10-CM | POA: Diagnosis not present

## 2023-05-20 DIAGNOSIS — Z5111 Encounter for antineoplastic chemotherapy: Secondary | ICD-10-CM | POA: Diagnosis not present

## 2023-05-20 DIAGNOSIS — E876 Hypokalemia: Secondary | ICD-10-CM | POA: Diagnosis not present

## 2023-05-20 DIAGNOSIS — I739 Peripheral vascular disease, unspecified: Secondary | ICD-10-CM | POA: Diagnosis not present

## 2023-05-20 DIAGNOSIS — E7800 Pure hypercholesterolemia, unspecified: Secondary | ICD-10-CM | POA: Diagnosis not present

## 2023-05-20 DIAGNOSIS — Z8616 Personal history of COVID-19: Secondary | ICD-10-CM | POA: Diagnosis not present

## 2023-05-20 DIAGNOSIS — Z86718 Personal history of other venous thrombosis and embolism: Secondary | ICD-10-CM | POA: Diagnosis not present

## 2023-05-20 MED ORDER — PEGFILGRASTIM-CBQV 6 MG/0.6ML ~~LOC~~ SOSY
6.0000 mg | PREFILLED_SYRINGE | Freq: Once | SUBCUTANEOUS | Status: AC
  Administered 2023-05-20: 6 mg via SUBCUTANEOUS
  Filled 2023-05-20: qty 0.6

## 2023-05-20 NOTE — Patient Instructions (Signed)
 [facility] - A DEPT OF [facility]  Discharge Instructions: Thank you for choosing [facility] to provide your oncology and hematology care.  If you have a lab appointment with the Cancer Center - please note that after April 8th, 2024, all labs will be drawn in the cancer center.  You do not have to check in or register with the main entrance as you have in the past but will complete your check-in in the cancer center.  Wear comfortable clothing and clothing appropriate for easy access to any Portacath or PICC line.   We strive to give you quality time with your provider. You may need to reschedule your appointment if you arrive late (15 or more minutes).  Arriving late affects you and other patients whose appointments are after yours.  Also, if you miss three or more appointments without notifying the office, you may be dismissed from the clinic at the provider's discretion.      For prescription refill requests, have your pharmacy contact our office and allow 72 hours for refills to be completed.    Today you received the following chemotherapy and/or immunotherapy agents Udenyca injection   To help prevent nausea and vomiting after your treatment, we encourage you to take your nausea medication as directed.  BELOW ARE SYMPTOMS THAT SHOULD BE REPORTED IMMEDIATELY: *FEVER GREATER THAN 100.4 F (38 C) OR HIGHER *CHILLS OR SWEATING *NAUSEA AND VOMITING THAT IS NOT CONTROLLED WITH YOUR NAUSEA MEDICATION *UNUSUAL SHORTNESS OF BREATH *UNUSUAL BRUISING OR BLEEDING *URINARY PROBLEMS (pain or burning when urinating, or frequent urination) *BOWEL PROBLEMS (unusual diarrhea, constipation, pain near the anus) TENDERNESS IN MOUTH AND THROAT WITH OR WITHOUT PRESENCE OF ULCERS (sore throat, sores in mouth, or a toothache) UNUSUAL RASH, SWELLING OR PAIN  UNUSUAL VAGINAL DISCHARGE OR ITCHING   Items with * indicate a potential emergency and should be  followed up as soon as possible or go to the Emergency Department if any problems should occur.  Please show the CHEMOTHERAPY ALERT CARD or IMMUNOTHERAPY ALERT CARD at check-in to the Emergency Department and triage nurse.  Should you have questions after your visit or need to cancel or reschedule your appointment, please contact [facility] [place] - A DEPT OF [facility] [phone]  and follow the prompts.  Office hours are 8:00 a.m. to 4:30 p.m. Monday - Friday. Please note that voicemails left after 4:00 p.m. may not be returned until the following business day.  We are closed weekends and major holidays. You have access to a nurse at all times for urgent questions. Please call the main number to the clinic [phone] and follow the prompts.  For any non-urgent questions, you may also contact your provider using MyChart. We now offer e-Visits for anyone 50 and older to request care online for non-urgent symptoms. For details visit [email or web address].   Also download the MyChart app! Go to the app store, search "MyChart", open the app, select [place], and log in with your MyChart username and password.

## 2023-05-20 NOTE — Progress Notes (Signed)
Udenyca injection given per orders. Patient tolerated it well without problems. Vitals stable and discharged home from clinic via wheelchair.  Follow up as scheduled.

## 2023-05-24 ENCOUNTER — Ambulatory Visit (HOSPITAL_COMMUNITY)
Admission: RE | Admit: 2023-05-24 | Discharge: 2023-05-24 | Disposition: A | Discharge status: Home / Self Care | Source: Ambulatory Visit | Attending: Gerontology | Admitting: Gerontology

## 2023-05-24 ENCOUNTER — Other Ambulatory Visit (HOSPITAL_COMMUNITY): Payer: Self-pay | Admitting: Gerontology

## 2023-05-24 DIAGNOSIS — R059 Cough, unspecified: Secondary | ICD-10-CM

## 2023-05-24 DIAGNOSIS — I5032 Chronic diastolic (congestive) heart failure: Secondary | ICD-10-CM | POA: Diagnosis not present

## 2023-05-24 DIAGNOSIS — I10 Essential (primary) hypertension: Secondary | ICD-10-CM | POA: Diagnosis not present

## 2023-05-24 DIAGNOSIS — R0989 Other specified symptoms and signs involving the circulatory and respiratory systems: Secondary | ICD-10-CM | POA: Diagnosis not present

## 2023-05-24 DIAGNOSIS — J209 Acute bronchitis, unspecified: Secondary | ICD-10-CM | POA: Diagnosis not present

## 2023-05-24 DIAGNOSIS — Z8572 Personal history of non-Hodgkin lymphomas: Secondary | ICD-10-CM | POA: Diagnosis not present

## 2023-05-24 DIAGNOSIS — R918 Other nonspecific abnormal finding of lung field: Secondary | ICD-10-CM | POA: Diagnosis not present

## 2023-06-02 ENCOUNTER — Encounter (HOSPITAL_COMMUNITY)
Admission: RE | Admit: 2023-06-02 | Discharge: 2023-06-02 | Disposition: A | Discharge status: Home / Self Care | Source: Ambulatory Visit | Attending: Hematology | Admitting: Hematology

## 2023-06-02 DIAGNOSIS — C8335 Diffuse large B-cell lymphoma, lymph nodes of inguinal region and lower limb: Secondary | ICD-10-CM | POA: Insufficient documentation

## 2023-06-02 DIAGNOSIS — C8510 Unspecified B-cell lymphoma, unspecified site: Secondary | ICD-10-CM | POA: Diagnosis not present

## 2023-06-02 MED ORDER — FLUDEOXYGLUCOSE F - 18 (FDG) INJECTION
6.1500 | Freq: Once | INTRAVENOUS | Status: AC | PRN
  Administered 2023-06-02: 6.15 via INTRAVENOUS

## 2023-06-07 ENCOUNTER — Other Ambulatory Visit: Payer: Self-pay

## 2023-06-07 DIAGNOSIS — C9000 Multiple myeloma not having achieved remission: Secondary | ICD-10-CM

## 2023-06-07 NOTE — Progress Notes (Signed)
 [name] [address] [place] [address]    Clinic Day:  06/08/2023  Referring physician: Benetta Spar*  Patient Care Team: Benetta Spar, MD as PCP - General (Internal Medicine) Wyline Mood Dorothe Pea, MD as PCP - Cardiology (Cardiology) Doreatha Massed, MD as Medical Oncologist (Medical Oncology)   ASSESSMENT & PLAN:   Assessment: 1.  Biclonal IgG lambda and IgM kappa multiple myeloma, high risk: - BMBX (11/02/2021): IgG lambda plasma cells 70% - Myeloma FISH panel: Monosomy 13 (standard GIST), duplication of 1 q. (high risk), gain of 11 q. (standard risk). - Cytogenetics: Hyperdiploid E weight gain (trisomy/trisomy) of odd-numbered chromosomes and monosomy 13, standard risk.  Gain of 1 q. associated with progressive course. - PET scan (11/05/2021): No evidence of multiple myeloma.  No hypermetabolic adenopathy. - Daratumumab, Revlimid and dexamethasone cycle 1 started on 11/30/2021   2.  JAK2 positive essential thrombocytosis: - BMBX (11/02/2021): No evidence of fibrosis.  No morphological evidence of JAK2 positive ET.   3.  History of marginal zone lymphoma of the spleen: - Diagnosed in 1999, status post CHOP x7 cycles followed by rituximab 1 cycle. - Status post splenectomy.  No evidence of lymphoma on current PET scan.   4.  Social/family history: - She lives with her sister.  Son lives 30 minutes away.  She is independent of ADLs and some IADLs.  She drives.  She worked at [employer] and denies any exposure to chemicals or pesticides.  No family history of malignancies.   5.  Bone protection (DEXA scan 02/17/2022 T-score -0.9): - She saw dentist who recommended dental extractions.  She does not want zoledronic acid or denosumab.   6.  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 - Initial needle biopsy on  01/19/2023 showed malignant hematopoietic neoplasm. - Excision biopsy on 02/18/2023: Diffuse large B-cell lymphoma. - Cycle 1 of R mini CHOP on 03/30/2023    Plan: 1.  IgG lambda multiple myeloma: - Revlimid and Darzalex are on hold while we are treating lymphoma.  Will resume myeloma therapy after completion of lymphoma.  Last M spike was 0.1 g in January.  Will obtain SPEP and light chains prior to next visit.   2.  Anemia from CKD and iron deficiency: - Last INFeD on 04/20/2023.  Hemoglobin today is 9.6, likely myelosuppression.   3.  Bilateral leg swellings: - Continue Lasix 20 mg daily as needed.  Swelling is well-controlled.  Albumin is 3.0.   4.  Pulmonary embolism (Dx 07/03/2022):: - Continue Eliquis twice daily indefinitely.  No bleeding issues.   5.  Hypokalemia: - Continue potassium 40 mill equivalents daily.  Potassium is 3.5.   6.  Diffuse large B-cell lymphoma of leg type, right leg: - She has completed 3 cycles of R mini CHOP and has been tolerating well. - Reportedly had pneumonia and was treated with 5 days of antibiotics by his PMD after last cycle.  Today she feels fine with regards to her breathing.  She has clear expectoration. - Physical exam: Right medial leg mass is stable. - Reviewed images of the PET scan (06/02/2023): Right leg mass measures 4.1 x 2.1 cm (4.1 x 2.6 cm) with SUV 6.4 (SUV 7.0).  Resolution of metabolic activity in the left shoulder musculature.  No evidence of lymphoma elsewhere in the body. - She  had partial response.  She will proceed with cycle 4 today.  Will consider ISRT after cycle 6.    Orders Placed This Encounter  Procedures   Kappa/lambda light chains   Protein electrophoresis, serum      I,[name],acting as a scribe for Doreatha Massed, MD.,have documented all relevant documentation on the behalf of Doreatha Massed, MD,as directed by  Doreatha Massed, MD while in the presence of Doreatha Massed, MD.   I,  Doreatha Massed MD, have reviewed the above documentation for accuracy and completeness, and I agree with the above.   Doreatha Massed, MD   4/16/20255:33 PM  CHIEF COMPLAINT:   Diagnosis: multiple myeloma, large B-cell lymphoma of leg type    Cancer Staging  Multiple myeloma (HCC) Staging form: Plasma Cell Myeloma and Plasma Cell Disorders, AJCC 8th Edition - Clinical stage from 11/16/2021: Albumin (g/dL): 3.1, ISS: Stage II, High-risk cytogenetics: Absent, LDH: Normal - Unsigned  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) Staging form: Primary Cutaneous B-Cell/T-Cell Lymphoma (Non-MF/SS Lymphoma), AJCC 8th Edition - Clinical stage from 03/01/2023: cT1a, cN0, cM0 - Unsigned    Prior Therapy: Daratumumab, Revlimid and dexamethasone, 11/30/21 -  03/02/23, held for lymphoma treatment  Current Therapy:  R mini CHOP    HISTORY OF PRESENT ILLNESS:   Oncology History  NHL (non-Hodgkin's lymphoma) (HCC)  07/19/1995 Pathology Results   Spleen biopsy with resection- low-grade B-cell lymphoma, splenic marginal zone type   02/20/1998 Imaging   CT CAP-marked and extensive cervical adenopathy   02/25/1998 Pathology Results   Right cervical lymph node demonstrating large B-cell lymphoma   03/05/1998 Bone Marrow Biopsy   No evidence of bone marrow involvement   03/11/1998 - 07/18/1998 Chemotherapy   CHOP x 7 cycles   05/09/1998 Imaging   CT CAP and neck- slight interval decrease in size of para-aortic adenopathy with interval decrease in right inguinal and bi-iliac adenopathy.  Interval decrease in size and number of enlarged cervical nodes   07/10/1998 Imaging   Ct CAP and neck- unremarkable CT of chest. No enlarged retroperitoneal adenopathy withthe previously noted retroperitoneal nodes currently smaller to stable in size.  Cervical adenopathy is stable to slightly smaller in size.   08/18/1998 - 09/08/1998 Chemotherapy   Rituxan Day 1, 8, 15, 22 x 1  cycle   01/08/1999 Remission   CT CAP and neck demonstrates no adenopathy or disease   01/08/1999 Imaging   CT CAP and neck- Stable CT of chest. Stable CT abd/pelvis without adenopathy.  Negative CT of neck   Multiple myeloma (HCC)  11/16/2021 Initial Diagnosis   Multiple myeloma (HCC)   11/30/2021 - 03/02/2023 Chemotherapy   Patient is on Treatment Plan : R-CHOP 03/29/23 - MYELOMA  Daratumumab SQ + Lenalidomide + Dexamethasone (DaraRd) q28d     03/29/2023 -  Chemotherapy   Patient is on Treatment Plan : NON-HODGKINS LYMPHOMA R-CHOP q21d x 4 cycles        INTERVAL HISTORY:   Leslie Williamson is a 89 y.o. female presenting to clinic today for follow up of multiple myeloma, large B-cell lymphoma of leg type. She was last seen by me on 05/18/23.  Since her last visit, she underwent restaging PET scan on 06/02/23 showing: no significant change in hypermetabolic soft tissue mass in medial aspect of right lower extremity; no evidence of lymphoma recurrence; interval resolution of metabolic activity in left shoulder musculature.  Today, she states that she is doing well overall. Her appetite level is at 100%.  Her energy level is at 75%.  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   Vertigo, labyrinthine     Surgical History: Past Surgical History:  Procedure  Laterality Date   CATARACT EXTRACTION W/PHACO Left 11/03/2015   Procedure: CATARACT EXTRACTION PHACO AND INTRAOCULAR LENS PLACEMENT (IOC);  Surgeon: Gemma Payor, MD;  Location: [facility];  Service: Ophthalmology;  Laterality: Left;  CDE: 7.74   CATARACT EXTRACTION W/PHACO Right 11/20/2015   Procedure: CATARACT EXTRACTION PHACO AND INTRAOCULAR LENS PLACEMENT ; CDE:  8.30;  Surgeon: Gemma Payor, MD;  Location: [facility];  Service: Ophthalmology;  Laterality: Right;   IR IMAGING GUIDED PORT INSERTION  11/04/2021   MASS EXCISION Right 02/18/2023   Procedure: Incisional Biopsy Right Lower Extremity;  Surgeon: Lucretia Roers, MD;  Location: [facility];  Service: General;  Laterality: Right;   PORT-A-CATH REMOVAL      Social History: Social History   Socioeconomic History   Marital status: Widowed    Spouse name: Not on file   Number of children: Not on file   Years of education: Not on file   Highest education level: Not on file  Occupational History   Not on file  Tobacco Use   Smoking status: Former   Smokeless tobacco: Never  Vaping Use   Vaping status: Never Used  Substance and Sexual Activity   Alcohol use: No   Drug use: No   Sexual activity: Not Currently  Other Topics Concern   Not on file  Social History Narrative   Not on file   Social Drivers of Health   Financial Resource Strain: Low Risk  (12/31/2019)   Overall Financial Resource Strain (CARDIA)    Difficulty of Paying Living Expenses: Not hard at all  Food Insecurity: No Food Insecurity (07/03/2022)   Hunger Vital Sign    Worried About Running Out of Food in the Last Year: Never true    Ran Out of Food in the Last Year: Never true  Transportation Needs: No Transportation Needs (07/03/2022)   PRAPARE - Administrator, Civil Service (Medical): No    Lack of Transportation (Non-Medical): No  Physical Activity: Inactive (12/31/2019)   Exercise Vital Sign    Days of Exercise per Week: 0 days    Minutes of Exercise  per Session: 0 min  Stress: No Stress Concern Present (12/31/2019)   [employer] of Occupational Health - Occupational Stress Questionnaire    Feeling of Stress : Not at all  Social Connections: Moderately Isolated (12/31/2019)   Social Connection and Isolation Panel [NHANES]    Frequency of Communication with Friends and Family: More than three times a week    Frequency of Social Gatherings with Friends and Family: More than three times a  week    Attends Religious Services: More than 4 times per year    Active Member of Clubs or Organizations: No    Attends Banker Meetings: Never    Marital Status: Widowed  Intimate Partner Violence: Not At Risk (07/03/2022)   Humiliation, Afraid, Rape, and Kick questionnaire    Fear of Current or Ex-Partner: No    Emotionally Abused: No    Physically Abused: No    Sexually Abused: No    Family History: Family History  Problem Relation Age of Onset   Diabetes Mother     Current Medications:  Current Outpatient Medications:    acetaminophen (TYLENOL) 500 MG tablet, Take 1,000 mg by mouth every 6 (six) hours as needed for moderate pain., Disp: , Rfl:    acyclovir (ZOVIRAX) 400 MG tablet, Take 1 tablet (400 mg total) by mouth 2 (two) times daily., Disp: 60 tablet, Rfl: 6   allopurinol (ZYLOPRIM) 300 MG tablet, Take 1 tablet (300 mg total) by mouth daily., Disp: 30 tablet, Rfl: 0   apixaban (ELIQUIS) 5 MG TABS tablet, Take 1 tablet (5 mg total) by mouth 2 (two) times daily., Disp: 60 tablet, Rfl: 1   atorvastatin (LIPITOR) 40 MG tablet, Take 40 mg by mouth at bedtime., Disp: , Rfl:    CYCLOPHOSPHAMIDE IV, Inject into the vein every 21 ( twenty-one) days., Disp: , Rfl:    dapagliflozin propanediol (FARXIGA) 10 MG TABS tablet, Take 1 tablet (10 mg total) by mouth daily., Disp: 30 tablet, Rfl: 1   dexamethasone (DECADRON) 4 MG tablet, Take 10 mg (2.5 tablets) by mouth weekly every Wednesday morning Per note by Dr. Cheree Cords on  09/15/2022, Disp: 20 tablet, Rfl: 6   DOXOrubicin HCl (ADRIAMYCIN IV), Inject into the vein every 21 ( twenty-one) days., Disp: , Rfl:    furosemide (LASIX) 40 MG tablet, Take 40 mg by mouth daily., Disp: , Rfl:    lenalidomide (REVLIMID) 10 MG capsule, Take 1 capsule (10 mg total) by mouth daily. Take for 21 days on, 7 days off., Disp: 21 capsule, Rfl: 0   lidocaine-prilocaine (EMLA) cream, Apply 1 Application topically as needed., Disp: 30 g, Rfl: 1   lidocaine-prilocaine (EMLA) cream, Apply to affected area once, Disp: 30 g, Rfl: 3   losartan (COZAAR) 25 MG tablet, Take 12.5 mg by mouth daily., Disp: , Rfl:    metoprolol succinate (TOPROL-XL) 25 MG 24 hr tablet, Take 0.5 tablets (12.5 mg total) by mouth daily., Disp: 30 tablet, Rfl: 0   ondansetron (ZOFRAN) 4 MG tablet, Take 1 tablet (4 mg total) by mouth every 8 (eight) hours as needed., Disp: 30 tablet, Rfl: 1   oxyCODONE (ROXICODONE) 5 MG immediate release tablet, Take 1 tablet (5 mg total) by mouth every 4 (four) hours as needed for severe pain (pain score 7-10) or breakthrough pain., Disp: 5 tablet, Rfl: 0   potassium chloride SA (KLOR-CON M20) 20 MEQ tablet, Take 1 tablet on the days you take lasix (Patient taking differently: Take 20 mEq by mouth 2 (two) times daily.), Disp: 180 tablet, Rfl: 3   predniSONE (DELTASONE) 20 MG tablet, Take 3 tablets (60 mg total) by mouth daily. Take with food on days 1-5 of chemotherapy., Disp: 25 tablet, Rfl: 5   predniSONE (DELTASONE) 5 MG tablet, Take 5 mg by mouth daily., Disp: , Rfl:    prochlorperazine (COMPAZINE) 10 MG tablet, Take 1 tablet (10 mg total) by mouth every 6 (six) hours as needed for nausea or vomiting.,  Disp: 30 tablet, Rfl: 6   promethazine-dextromethorphan (PROMETHAZINE-DM) 6.25-15 MG/5ML syrup, Take 5 mLs by mouth every 6 (six) hours as needed., Disp: , Rfl:    riTUXimab (RITUXAN IV), Inject into the vein every 21 ( twenty-one) days., Disp: , Rfl:    VINCRISTINE SULFATE IV, Inject into  the vein every 21 ( twenty-one) days., Disp: , Rfl:  No current facility-administered medications for this visit.  Facility-Administered Medications Ordered in Other Visits:    0.9 %  sodium chloride infusion, , Intravenous, Continuous, Paulett Boros, MD, Stopped at 03/29/23 1333   0.9 %  sodium chloride infusion, , Intravenous, Continuous, Paulett Boros, MD, Stopped at 06/08/23 1612   sodium chloride flush (NS) 0.9 % injection 10 mL, 10 mL, Intracatheter, PRN, Delayza Lungren, MD, 10 mL at 03/29/23 1336   sodium chloride flush (NS) 0.9 % injection 10 mL, 10 mL, Intracatheter, PRN, Marta Bouie, MD, 10 mL at 06/08/23 1613   Allergies: Allergies  Allergen Reactions   Penicillins Rash    REVIEW OF SYSTEMS:   Review of Systems  Constitutional:  Negative for chills, fatigue and fever.  HENT:   Negative for lump/mass, mouth sores, nosebleeds, sore throat and trouble swallowing.   Eyes:  Negative for eye problems.  Respiratory:  Positive for cough. Negative for shortness of breath.   Cardiovascular:  Negative for chest pain, leg swelling and palpitations.  Gastrointestinal:  Positive for nausea. Negative for abdominal pain, constipation, diarrhea and vomiting.  Genitourinary:  Negative for bladder incontinence, difficulty urinating, dysuria, frequency, hematuria and nocturia.   Musculoskeletal:  Negative for arthralgias, back pain, flank pain, myalgias and neck pain.  Skin:  Negative for itching and rash.  Neurological:  Positive for numbness. Negative for dizziness and headaches.  Hematological:  Does not bruise/bleed easily.  Psychiatric/Behavioral:  Negative for depression, sleep disturbance and suicidal ideas. The patient is not nervous/anxious.   All other systems reviewed and are negative.    VITALS:   Blood pressure 125/62, pulse 64, temperature 97.8 F (36.6 C), temperature source Tympanic, resp. rate 17, height 5' (1.524 m), weight 128 lb (58.1 kg),  SpO2 94%.  Wt Readings from Last 3 Encounters:  06/08/23 128 lb (58.1 kg)  05/18/23 121 lb 9.6 oz (55.2 kg)  04/19/23 121 lb 11.1 oz (55.2 kg)    Body mass index is 25 kg/m.  Performance status (ECOG): 1 - Symptomatic but completely ambulatory  PHYSICAL EXAM:   Physical Exam Vitals and nursing note reviewed. Exam conducted with a chaperone present.  Constitutional:      Appearance: Normal appearance.  Cardiovascular:     Rate and Rhythm: Normal rate and regular rhythm.     Pulses: Normal pulses.     Heart sounds: Normal heart sounds.  Pulmonary:     Effort: Pulmonary effort is normal.     Breath sounds: Normal breath sounds.  Abdominal:     Palpations: Abdomen is soft. There is no hepatomegaly, splenomegaly or mass.     Tenderness: There is no abdominal tenderness.  Musculoskeletal:     Right lower leg: No edema.     Left lower leg: No edema.  Lymphadenopathy:     Cervical: No cervical adenopathy.     Right cervical: No superficial, deep or posterior cervical adenopathy.    Left cervical: No superficial, deep or posterior cervical adenopathy.     Upper Body:     Right upper body: No supraclavicular or axillary adenopathy.     Left upper body: No  supraclavicular or axillary adenopathy.  Neurological:     General: No focal deficit present.     Mental Status: She is alert and oriented to person, place, and time.  Psychiatric:        Mood and Affect: Mood normal.        Behavior: Behavior normal.     LABS:   CBC     Component Value Date/Time   WBC 7.2 06/08/2023 0902   RBC 3.06 (L) 06/08/2023 0902   HGB 9.6 (L) 06/08/2023 0902   HCT 29.9 (L) 06/08/2023 0902   PLT 535 (H) 06/08/2023 0902   MCV 97.7 06/08/2023 0902   MCH 31.4 06/08/2023 0902   MCHC 32.1 06/08/2023 0902   RDW 23.6 (H) 06/08/2023 0902   LYMPHSABS 1.5 06/08/2023 0902   MONOABS 1.4 (H) 06/08/2023 0902   EOSABS 0.0 06/08/2023 0902   BASOSABS 0.0 06/08/2023 0902    CMP      Component Value  Date/Time   NA 141 06/08/2023 0902   K 3.5 06/08/2023 0902   CL 106 06/08/2023 0902   CO2 24 06/08/2023 0902   GLUCOSE 98 06/08/2023 0902   BUN 27 (H) 06/08/2023 0902   CREATININE 0.80 06/08/2023 0902   CALCIUM 8.7 (L) 06/08/2023 0902   PROT 5.0 (L) 06/08/2023 0902   ALBUMIN 3.0 (L) 06/08/2023 0902   AST 16 06/08/2023 0902   ALT 13 06/08/2023 0902   ALKPHOS 78 06/08/2023 0902   BILITOT 0.4 06/08/2023 0902   GFRNONAA >60 06/08/2023 0902   GFRAA 43 (L) 08/01/2019 1326     No results found for: "CEA1", "CEA" / No results found for: "CEA1", "CEA" No results found for: "PSA1" No results found for: "CAN199" No results found for: "CAN125"  Lab Results  Component Value Date   TOTALPROTELP 5.4 (L) 03/02/2023   ALBUMINELP 3.1 03/02/2023   A1GS 0.3 03/02/2023   A2GS 0.7 03/02/2023   BETS 1.0 03/02/2023   GAMS 0.4 03/02/2023   MSPIKE 0.1 (H) 03/02/2023   SPEI Comment 03/02/2023   Lab Results  Component Value Date   TIBC 358 04/19/2023   TIBC 364 03/16/2023   TIBC 375 09/15/2022   FERRITIN 10 (L) 04/19/2023   FERRITIN 6 (L) 03/16/2023   FERRITIN 7 (L) 09/15/2022   IRONPCTSAT 4 (L) 04/19/2023   IRONPCTSAT 12 03/16/2023   IRONPCTSAT 5 (L) 09/15/2022   Lab Results  Component Value Date   LDH 122 03/02/2023   LDH 154 09/30/2021   LDH 148 08/05/2021     STUDIES:   NM PET Image Restage (PS) Whole Body Result Date: 06/07/2023 CLINICAL DATA:  Subsequent treatment strategy for B-cell lymphoma. EXAM: NUCLEAR MEDICINE PET WHOLE BODY TECHNIQUE: 6.15 mCi F-18 FDG was injected intravenously. Full-ring PET imaging was performed from the head to foot after the radiotracer. CT data was obtained and used for attenuation correction and anatomic localization. Fasting blood glucose: 89 mg/dl COMPARISON:  PET-CT 46/96/2952 FINDINGS: HEAD/NECK: No hypermetabolic activity in the scalp. No hypermetabolic cervical lymph nodes. Incidental CT findings: none CHEST: No hypermetabolic mediastinal or  hilar nodes. No suspicious pulmonary nodules on the CT scan. Uniform mild metabolic activity throughout the course of the esophagus is favored benign esophagitis. Incidental CT findings: Port in the anterior chest wall with tip in distal SVC. ABDOMEN/PELVIS: No abnormal hypermetabolic activity within the liver, pancreas, or adrenal glands. No hypermetabolic lymph nodes in the abdomen or pelvis. Incidental CT findings: Post splenectomy. No change in benign fluid collection along the RIGHT  pericolic gutter. SKELETON: No radiotracer avid lesions in the is skeleton. Interval resolution of previous described metabolic activity in the LEFT shoulder musculature. Incidental CT findings: none EXTREMITIES: Hypermetabolic soft tissue mass in the medial RIGHT lower extremity again demonstrated measuring 4.1 x 2.1 cm with SUV max equal 6.4 (image 375) compared to 4.1 x 2.6 cm mass with SUV max equal 7.0 comparison PET-CT scan. Incidental CT findings: none IMPRESSION: 1. No significant change in hypermetabolic soft tissue mass in the medial aspect of the RIGHT lower extremity. 2. No evidence of lymphoma recurrence in the neck, chest, abdomen, or pelvis. 3. Interval resolution of metabolic activity in the LEFT shoulder musculature. 4. Post splenectomy. Electronically Signed   By: Deboraha Fallow M.D.   On: 06/07/2023 11:44   DG Chest 2 View Result Date: 05/30/2023 CLINICAL DATA:  Coughing since yesterday. Former smoker. History of hypertension. History of lymphoma. EXAM: CHEST - 2 VIEW COMPARISON:  07/03/2022. FINDINGS: There is opacity in the left lower lobe partly silhouetting the posterior hemidiaphragm. This is new since prior exams. Remainder of the lungs is clear. No pleural effusion or pneumothorax. Cardiac silhouette is normal in size. No mediastinal or hilar masses. No evidence of adenopathy. Right anterior chest wall Port-A-Cath is stable from the prior exams. Skeletal structures are intact. IMPRESSION: 1. Left lower  lobe opacity consistent with pneumonia. Electronically Signed   By: Amanda Jungling M.D.   On: 05/30/2023 13:34

## 2023-06-08 ENCOUNTER — Inpatient Hospital Stay

## 2023-06-08 ENCOUNTER — Inpatient Hospital Stay: Admitting: Hematology

## 2023-06-08 ENCOUNTER — Inpatient Hospital Stay: Attending: Hematology

## 2023-06-08 VITALS — BP 113/48 | HR 76 | Temp 98.0°F | Resp 17

## 2023-06-08 VITALS — BP 125/62 | HR 64 | Temp 97.8°F | Resp 17 | Ht 60.0 in | Wt 128.0 lb

## 2023-06-08 DIAGNOSIS — I739 Peripheral vascular disease, unspecified: Secondary | ICD-10-CM | POA: Insufficient documentation

## 2023-06-08 DIAGNOSIS — E876 Hypokalemia: Secondary | ICD-10-CM | POA: Insufficient documentation

## 2023-06-08 DIAGNOSIS — Z7961 Long term (current) use of immunomodulator: Secondary | ICD-10-CM | POA: Diagnosis not present

## 2023-06-08 DIAGNOSIS — N189 Chronic kidney disease, unspecified: Secondary | ICD-10-CM | POA: Diagnosis not present

## 2023-06-08 DIAGNOSIS — Z5111 Encounter for antineoplastic chemotherapy: Secondary | ICD-10-CM | POA: Diagnosis not present

## 2023-06-08 DIAGNOSIS — K209 Esophagitis, unspecified without bleeding: Secondary | ICD-10-CM | POA: Insufficient documentation

## 2023-06-08 DIAGNOSIS — Z95828 Presence of other vascular implants and grafts: Secondary | ICD-10-CM

## 2023-06-08 DIAGNOSIS — Z79899 Other long term (current) drug therapy: Secondary | ICD-10-CM | POA: Insufficient documentation

## 2023-06-08 DIAGNOSIS — D509 Iron deficiency anemia, unspecified: Secondary | ICD-10-CM | POA: Insufficient documentation

## 2023-06-08 DIAGNOSIS — I130 Hypertensive heart and chronic kidney disease with heart failure and stage 1 through stage 4 chronic kidney disease, or unspecified chronic kidney disease: Secondary | ICD-10-CM | POA: Insufficient documentation

## 2023-06-08 DIAGNOSIS — I700 Atherosclerosis of aorta: Secondary | ICD-10-CM | POA: Diagnosis not present

## 2023-06-08 DIAGNOSIS — I2699 Other pulmonary embolism without acute cor pulmonale: Secondary | ICD-10-CM | POA: Diagnosis not present

## 2023-06-08 DIAGNOSIS — C9000 Multiple myeloma not having achieved remission: Secondary | ICD-10-CM

## 2023-06-08 DIAGNOSIS — E7800 Pure hypercholesterolemia, unspecified: Secondary | ICD-10-CM | POA: Insufficient documentation

## 2023-06-08 DIAGNOSIS — Z87891 Personal history of nicotine dependence: Secondary | ICD-10-CM | POA: Diagnosis not present

## 2023-06-08 DIAGNOSIS — Z8572 Personal history of non-Hodgkin lymphomas: Secondary | ICD-10-CM | POA: Insufficient documentation

## 2023-06-08 DIAGNOSIS — D631 Anemia in chronic kidney disease: Secondary | ICD-10-CM | POA: Diagnosis not present

## 2023-06-08 DIAGNOSIS — D75839 Thrombocytosis, unspecified: Secondary | ICD-10-CM | POA: Diagnosis not present

## 2023-06-08 DIAGNOSIS — C8335 Diffuse large B-cell lymphoma, lymph nodes of inguinal region and lower limb: Secondary | ICD-10-CM | POA: Diagnosis not present

## 2023-06-08 DIAGNOSIS — I5020 Unspecified systolic (congestive) heart failure: Secondary | ICD-10-CM | POA: Diagnosis not present

## 2023-06-08 LAB — COMPREHENSIVE METABOLIC PANEL WITH GFR
ALT: 13 U/L (ref 0–44)
AST: 16 U/L (ref 15–41)
Albumin: 3 g/dL — ABNORMAL LOW (ref 3.5–5.0)
Alkaline Phosphatase: 78 U/L (ref 38–126)
Anion gap: 11 (ref 5–15)
BUN: 27 mg/dL — ABNORMAL HIGH (ref 8–23)
CO2: 24 mmol/L (ref 22–32)
Calcium: 8.7 mg/dL — ABNORMAL LOW (ref 8.9–10.3)
Chloride: 106 mmol/L (ref 98–111)
Creatinine, Ser: 0.8 mg/dL (ref 0.44–1.00)
GFR, Estimated: >60 mL/min (ref >60)
Glucose, Bld: 98 mg/dL (ref 70–99)
Potassium: 3.5 mmol/L (ref 3.5–5.1)
Sodium: 141 mmol/L (ref 135–145)
Total Bilirubin: 0.4 mg/dL (ref 0.0–1.2)
Total Protein: 5 g/dL — ABNORMAL LOW (ref 6.5–8.1)

## 2023-06-08 LAB — CBC WITH DIFFERENTIAL/PLATELET
Abs Immature Granulocytes: 0.04 10*3/uL (ref 0.00–0.07)
Basophils Absolute: 0 10*3/uL (ref 0.0–0.1)
Basophils Relative: 0 %
Eosinophils Absolute: 0 10*3/uL (ref 0.0–0.5)
Eosinophils Relative: 0 %
HCT: 29.9 % — ABNORMAL LOW (ref 36.0–46.0)
Hemoglobin: 9.6 g/dL — ABNORMAL LOW (ref 12.0–15.0)
Immature Granulocytes: 1 %
Lymphocytes Relative: 21 %
Lymphs Abs: 1.5 10*3/uL (ref 0.7–4.0)
MCH: 31.4 pg (ref 26.0–34.0)
MCHC: 32.1 g/dL (ref 30.0–36.0)
MCV: 97.7 fL (ref 80.0–100.0)
Monocytes Absolute: 1.4 10*3/uL — ABNORMAL HIGH (ref 0.1–1.0)
Monocytes Relative: 20 %
Neutro Abs: 4.1 10*3/uL (ref 1.7–7.7)
Neutrophils Relative %: 58 %
Platelets: 535 10*3/uL — ABNORMAL HIGH (ref 150–400)
RBC: 3.06 MIL/uL — ABNORMAL LOW (ref 3.87–5.11)
RDW: 23.6 % — ABNORMAL HIGH (ref 11.5–15.5)
WBC: 7.2 10*3/uL (ref 4.0–10.5)
nRBC: 0 % (ref 0.0–0.2)

## 2023-06-08 LAB — MAGNESIUM: Magnesium: 1.8 mg/dL (ref 1.7–2.4)

## 2023-06-08 MED ORDER — SODIUM CHLORIDE FLUSH 0.9 % IV SOLN
10.0000 mL | Freq: Once | INTRAVENOUS | Status: AC
  Administered 2023-06-08: 10 mL via INTRAVENOUS
  Filled 2023-06-08: qty 10

## 2023-06-08 MED ORDER — SODIUM CHLORIDE 0.9 % IV SOLN
150.0000 mg | Freq: Once | INTRAVENOUS | Status: AC
  Administered 2023-06-08: 150 mg via INTRAVENOUS
  Filled 2023-06-08: qty 150

## 2023-06-08 MED ORDER — DEXAMETHASONE SODIUM PHOSPHATE 10 MG/ML IJ SOLN
10.0000 mg | Freq: Once | INTRAMUSCULAR | Status: AC
  Administered 2023-06-08: 10 mg via INTRAVENOUS
  Filled 2023-06-08: qty 1

## 2023-06-08 MED ORDER — FAMOTIDINE IN NACL 20-0.9 MG/50ML-% IV SOLN
20.0000 mg | Freq: Once | INTRAVENOUS | Status: AC
  Administered 2023-06-08: 20 mg via INTRAVENOUS
  Filled 2023-06-08: qty 50

## 2023-06-08 MED ORDER — SODIUM CHLORIDE 0.9 % IV SOLN: INTRAVENOUS | Status: DC

## 2023-06-08 MED ORDER — CYCLOPHOSPHAMIDE CHEMO INJECTION 1 GM
400.0000 mg/m2 | Freq: Once | INTRAMUSCULAR | Status: AC
  Administered 2023-06-08: 600 mg via INTRAVENOUS
  Filled 2023-06-08: qty 30

## 2023-06-08 MED ORDER — CETIRIZINE HCL 10 MG/ML IV SOLN
10.0000 mg | Freq: Once | INTRAVENOUS | Status: AC
  Administered 2023-06-08: 10 mg via INTRAVENOUS
  Filled 2023-06-08: qty 1

## 2023-06-08 MED ORDER — PALONOSETRON HCL INJECTION 0.25 MG/5ML
0.2500 mg | Freq: Once | INTRAVENOUS | Status: AC
  Administered 2023-06-08: 0.25 mg via INTRAVENOUS
  Filled 2023-06-08: qty 5

## 2023-06-08 MED ORDER — SODIUM CHLORIDE 0.9% FLUSH
10.0000 mL | INTRAVENOUS | Status: DC | PRN
Start: 2023-06-08 — End: 2023-06-08
  Administered 2023-06-08: 10 mL

## 2023-06-08 MED ORDER — HEPARIN SOD (PORK) LOCK FLUSH 100 UNIT/ML IV SOLN
500.0000 [IU] | Freq: Once | INTRAVENOUS | Status: AC | PRN
  Administered 2023-06-08: 500 [IU]

## 2023-06-08 MED ORDER — ACETAMINOPHEN 325 MG PO TABS
650.0000 mg | ORAL_TABLET | Freq: Once | ORAL | Status: AC
  Administered 2023-06-08: 650 mg via ORAL
  Filled 2023-06-08: qty 2

## 2023-06-08 MED ORDER — SODIUM CHLORIDE 0.9 % IV SOLN
375.0000 mg/m2 | Freq: Once | INTRAVENOUS | Status: AC
  Administered 2023-06-08: 600 mg via INTRAVENOUS
  Filled 2023-06-08: qty 50

## 2023-06-08 MED ORDER — VINCRISTINE SULFATE CHEMO INJECTION 1 MG/ML
1.0000 mg | Freq: Once | INTRAVENOUS | Status: AC
  Administered 2023-06-08: 1 mg via INTRAVENOUS
  Filled 2023-06-08: qty 1

## 2023-06-08 MED ORDER — DOXORUBICIN HCL CHEMO IV INJECTION 2 MG/ML
25.0000 mg/m2 | Freq: Once | INTRAVENOUS | Status: AC
  Administered 2023-06-08: 38 mg via INTRAVENOUS
  Filled 2023-06-08: qty 19

## 2023-06-08 NOTE — Progress Notes (Signed)
 Patient presents today for Rituximab/Adriamycin/Vincristine/Cytoxan infusion. Patient is in satisfactory condition with no new complaints voiced.  Vital signs are stable.  Labs reviewed by Dr. Cheree Cords during the office visit and all labs are within treatment parameters.  We will proceed with treatment per MD orders.

## 2023-06-08 NOTE — Patient Instructions (Addendum)
 [facility] at [facility] Discharge Instructions   You were seen and examined today by Dr. Cheree Cords.  He reviewed the results of your lab work which are normal/stable.   He reviewed the results of your PET scan. It is showing some areas of improvement in the shoulder blade. There was not much improvement in the area of lymphoma in your leg. We will continue treatment with chemo, and then once you finish with this treatment we will arrange for you to have radiation to the leg to complete treatment.   We will proceed with your treatment today.   Return as scheduled.    Thank you for choosing [facility] at [facility] to provide your oncology and hematology care.  To afford each patient quality time with our provider, please arrive at least 15 minutes before your scheduled appointment time.   If you have a lab appointment with the Cancer Center please come in thru the Main Entrance and check in at the main information desk.  You need to re-schedule your appointment should you arrive 10 or more minutes late.  We strive to give you quality time with our providers, and arriving late affects you and other patients whose appointments are after yours.  Also, if you no show three or more times for appointments you may be dismissed from the clinic at the providers discretion.     Again, thank you for choosing [facility].  Our hope is that these requests will decrease the amount of time that you wait before being seen by our physicians.       _____________________________________________________________  Should you have questions after your visit to [facility], please contact our office at [phone] and follow the prompts.  Our office hours are 8:00 a.m. and 4:30 p.m. Monday - Friday.  Please note that voicemails left after 4:00 p.m. may not be returned until the following business day.  We are closed weekends and major  holidays.  You do have access to a nurse 24-7, just call the main number to the clinic [phone] and do not press any options, hold on the line and a nurse will answer the phone.    For prescription refill requests, have your pharmacy contact our office and allow 72 hours.    Due to Covid, you will need to wear a mask upon entering the hospital. If you do not have a mask, a mask will be given to you at the Main Entrance upon arrival. For doctor visits, patients may have 1 support person age 14 or older with them. For treatment visits, patients can not have anyone with them due to social distancing guidelines and our immunocompromised population.

## 2023-06-08 NOTE — Progress Notes (Signed)
 Patient has been examined by Dr. Ellin Saba. Vital signs and labs have been reviewed by MD - ANC, Creatinine, LFTs, hemoglobin, and platelets are within treatment parameters per M.D. - pt may proceed with treatment.  Primary RN and pharmacy notified.

## 2023-06-08 NOTE — Progress Notes (Signed)
Treatment given per orders. Patient tolerated it well without problems. Vitals stable and discharged home from clinic via wheelchair Follow up as scheduled.  

## 2023-06-08 NOTE — Patient Instructions (Signed)
 [facility] [place] - A DEPT OF [facility]  Discharge Instructions: Thank you for choosing [facility] to provide your oncology and hematology care.  If you have a lab appointment with the Cancer Center - please note that after April 8th, 2024, all labs will be drawn in the cancer center.  You do not have to check in or register with the main entrance as you have in the past but will complete your check-in in the cancer center.  Wear comfortable clothing and clothing appropriate for easy access to any Portacath or PICC line.   We strive to give you quality time with your provider. You may need to reschedule your appointment if you arrive late (15 or more minutes).  Arriving late affects you and other patients whose appointments are after yours.  Also, if you miss three or more appointments without notifying the office, you may be dismissed from the clinic at the provider's discretion.      For prescription refill requests, have your pharmacy contact our office and allow 72 hours for refills to be completed.    Today you received the following chemotherapy and/or immunotherapy agents Rituxumab, vincristine, Adriamycin, cyclophosphamide    To help prevent nausea and vomiting after your treatment, we encourage you to take your nausea medication as directed.  BELOW ARE SYMPTOMS THAT SHOULD BE REPORTED IMMEDIATELY: *FEVER GREATER THAN 100.4 F (38 C) OR HIGHER *CHILLS OR SWEATING *NAUSEA AND VOMITING THAT IS NOT CONTROLLED WITH YOUR NAUSEA MEDICATION *UNUSUAL SHORTNESS OF BREATH *UNUSUAL BRUISING OR BLEEDING *URINARY PROBLEMS (pain or burning when urinating, or frequent urination) *BOWEL PROBLEMS (unusual diarrhea, constipation, pain near the anus) TENDERNESS IN MOUTH AND THROAT WITH OR WITHOUT PRESENCE OF ULCERS (sore throat, sores in mouth, or a toothache) UNUSUAL RASH, SWELLING OR PAIN  UNUSUAL VAGINAL DISCHARGE OR ITCHING   Items with * indicate a  potential emergency and should be followed up as soon as possible or go to the Emergency Department if any problems should occur.  Please show the CHEMOTHERAPY ALERT CARD or IMMUNOTHERAPY ALERT CARD at check-in to the Emergency Department and triage nurse.  Should you have questions after your visit or need to cancel or reschedule your appointment, please contact [facility] [place] - A DEPT OF [facility] [phone]  and follow the prompts.  Office hours are 8:00 a.m. to 4:30 p.m. Monday - Friday. Please note that voicemails left after 4:00 p.m. may not be returned until the following business day.  We are closed weekends and major holidays. You have access to a nurse at all times for urgent questions. Please call the main number to the clinic [phone] and follow the prompts.  For any non-urgent questions, you may also contact your provider using MyChart. We now offer e-Visits for anyone 39 and older to request care online for non-urgent symptoms. For details visit [email or web address].   Also download the MyChart app! Go to the app store, search "MyChart", open the app, select [facility], and log in with your MyChart username and password.

## 2023-06-09 ENCOUNTER — Other Ambulatory Visit: Payer: Self-pay

## 2023-06-09 LAB — KAPPA/LAMBDA LIGHT CHAINS
Kappa free light chain: 16.1 mg/L (ref 3.3–19.4)
Kappa, lambda light chain ratio: 3.43 — ABNORMAL HIGH (ref 0.26–1.65)
Lambda free light chains: 4.7 mg/L — ABNORMAL LOW (ref 5.7–26.3)

## 2023-06-10 ENCOUNTER — Other Ambulatory Visit: Payer: Self-pay

## 2023-06-10 ENCOUNTER — Inpatient Hospital Stay

## 2023-06-10 VITALS — BP 116/49 | HR 72 | Temp 98.5°F | Resp 18

## 2023-06-10 DIAGNOSIS — C9000 Multiple myeloma not having achieved remission: Secondary | ICD-10-CM

## 2023-06-10 DIAGNOSIS — Z87891 Personal history of nicotine dependence: Secondary | ICD-10-CM | POA: Diagnosis not present

## 2023-06-10 DIAGNOSIS — N189 Chronic kidney disease, unspecified: Secondary | ICD-10-CM | POA: Diagnosis not present

## 2023-06-10 DIAGNOSIS — C8335 Diffuse large B-cell lymphoma, lymph nodes of inguinal region and lower limb: Secondary | ICD-10-CM | POA: Diagnosis not present

## 2023-06-10 DIAGNOSIS — I739 Peripheral vascular disease, unspecified: Secondary | ICD-10-CM | POA: Diagnosis not present

## 2023-06-10 DIAGNOSIS — K209 Esophagitis, unspecified without bleeding: Secondary | ICD-10-CM | POA: Diagnosis not present

## 2023-06-10 DIAGNOSIS — E876 Hypokalemia: Secondary | ICD-10-CM | POA: Diagnosis not present

## 2023-06-10 DIAGNOSIS — Z7961 Long term (current) use of immunomodulator: Secondary | ICD-10-CM | POA: Diagnosis not present

## 2023-06-10 DIAGNOSIS — Z8572 Personal history of non-Hodgkin lymphomas: Secondary | ICD-10-CM | POA: Diagnosis not present

## 2023-06-10 DIAGNOSIS — D702 Other drug-induced agranulocytosis: Secondary | ICD-10-CM

## 2023-06-10 DIAGNOSIS — I130 Hypertensive heart and chronic kidney disease with heart failure and stage 1 through stage 4 chronic kidney disease, or unspecified chronic kidney disease: Secondary | ICD-10-CM | POA: Diagnosis not present

## 2023-06-10 DIAGNOSIS — Z79899 Other long term (current) drug therapy: Secondary | ICD-10-CM | POA: Diagnosis not present

## 2023-06-10 DIAGNOSIS — I700 Atherosclerosis of aorta: Secondary | ICD-10-CM | POA: Diagnosis not present

## 2023-06-10 DIAGNOSIS — Z5111 Encounter for antineoplastic chemotherapy: Secondary | ICD-10-CM | POA: Diagnosis not present

## 2023-06-10 DIAGNOSIS — D509 Iron deficiency anemia, unspecified: Secondary | ICD-10-CM

## 2023-06-10 DIAGNOSIS — E7800 Pure hypercholesterolemia, unspecified: Secondary | ICD-10-CM | POA: Diagnosis not present

## 2023-06-10 DIAGNOSIS — I5020 Unspecified systolic (congestive) heart failure: Secondary | ICD-10-CM | POA: Diagnosis not present

## 2023-06-10 DIAGNOSIS — D75839 Thrombocytosis, unspecified: Secondary | ICD-10-CM | POA: Diagnosis not present

## 2023-06-10 DIAGNOSIS — D631 Anemia in chronic kidney disease: Secondary | ICD-10-CM | POA: Diagnosis not present

## 2023-06-10 DIAGNOSIS — I2699 Other pulmonary embolism without acute cor pulmonale: Secondary | ICD-10-CM | POA: Diagnosis not present

## 2023-06-10 LAB — PROTEIN ELECTROPHORESIS, SERUM
A/G Ratio: 1.9 — ABNORMAL HIGH (ref 0.7–1.7)
Albumin ELP: 2.8 g/dL — ABNORMAL LOW (ref 2.9–4.4)
Alpha-1-Globulin: 0.2 g/dL (ref 0.0–0.4)
Alpha-2-Globulin: 0.4 g/dL (ref 0.4–1.0)
Beta Globulin: 0.6 g/dL — ABNORMAL LOW (ref 0.7–1.3)
Gamma Globulin: 0.3 g/dL — ABNORMAL LOW (ref 0.4–1.8)
Globulin, Total: 1.5 g/dL — ABNORMAL LOW (ref 2.2–3.9)
M-Spike, %: 0.1 g/dL — ABNORMAL HIGH
Total Protein ELP: 4.3 g/dL — ABNORMAL LOW (ref 6.0–8.5)

## 2023-06-10 MED ORDER — PEGFILGRASTIM-CBQV 6 MG/0.6ML ~~LOC~~ SOSY
6.0000 mg | PREFILLED_SYRINGE | Freq: Once | SUBCUTANEOUS | Status: AC
Start: 2023-06-10 — End: 2023-06-10
  Administered 2023-06-10: 6 mg via SUBCUTANEOUS
  Filled 2023-06-10: qty 0.6

## 2023-06-10 MED ORDER — FILGRASTIM-AAFI 300 MCG/0.5ML IJ SOSY
300.0000 ug | PREFILLED_SYRINGE | Freq: Once | INTRAMUSCULAR | Status: DC
Start: 2023-06-10 — End: 2023-06-10

## 2023-06-10 NOTE — Patient Instructions (Signed)
 [facility] [place] - A DEPT OF [facility]  Discharge Instructions: Thank you for choosing [facility] to provide your oncology and hematology care.  If you have a lab appointment with the Cancer Center - please no

## 2023-06-10 NOTE — Progress Notes (Signed)
 Patient presents today for Udenyca  injection per providers order.  Vital signs WNL.  Patient has no new complaints.  Stable during administration without incident; injection site WNL; see MAR for injection details.  Patient tolerated procedure well and without incident.  No questions or complaints noted at this time.

## 2023-06-22 ENCOUNTER — Other Ambulatory Visit: Payer: Self-pay | Admitting: Hematology

## 2023-06-22 DIAGNOSIS — E876 Hypokalemia: Secondary | ICD-10-CM

## 2023-06-23 ENCOUNTER — Encounter: Payer: Self-pay | Admitting: Hematology

## 2023-06-23 DIAGNOSIS — C9002 Multiple myeloma in relapse: Secondary | ICD-10-CM | POA: Diagnosis not present

## 2023-06-23 DIAGNOSIS — I5032 Chronic diastolic (congestive) heart failure: Secondary | ICD-10-CM | POA: Diagnosis not present

## 2023-06-28 NOTE — Progress Notes (Signed)
 [facility] [address] [place] [address]    Clinic Day:  06/29/2023  Referring physician: [name], [name] Demissie*  Patient Care Team: Wyvonna Heidelberg, MD as PCP - General (Internal Medicine) Amanda Jungling Joyceann No, MD as PCP - Cardiology (Cardiology) Paulett Boros, MD as Medical Oncologist (Medical Oncology)   ASSESSMENT & PLAN:   Assessment: 1.  Biclonal IgG lambda and IgM kappa multiple myeloma, high risk: - BMBX (11/02/2021): IgG lambda plasma cells 70% - Myeloma FISH panel: Monosomy 13 (standard GIST), duplication of 1 q. (high risk), gain of 11 q. (standard risk). - Cytogenetics: Hyperdiploid E weight gain (trisomy/trisomy) of odd-numbered chromosomes and monosomy 13, standard risk.  Gain of 1 q. associated with progressive course. - PET scan (11/05/2021): No evidence of multiple myeloma.  No hypermetabolic adenopathy. - Daratumumab , Revlimid  and dexamethasone  cycle 1 started on 11/30/2021   2.  JAK2 positive essential thrombocytosis: - BMBX (11/02/2021): No evidence of fibrosis.  No morphological evidence of JAK2 positive ET.   3.  History of marginal zone lymphoma of the spleen: - Diagnosed in 1999, status post CHOP x7 cycles followed by rituximab  1 cycle. - Status post splenectomy.  No evidence of lymphoma on current PET scan.   4.  Social/family history: - She lives with her sister.  Son lives 30 minutes away.  She is independent of ADLs and some IADLs.  She drives.  She worked at [employer] and denies any exposure to chemicals or pesticides.  No family history of malignancies.   5.  Bone protection (DEXA scan 02/17/2022 T-score -0.9): - She saw dentist who recommended dental extractions.  She does not want zoledronic acid or denosumab.   6.  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 - Initial needle biopsy on  01/19/2023 showed malignant hematopoietic neoplasm. - Excision biopsy on 02/18/2023: Diffuse large B-cell lymphoma. - Cycle 1 of R mini CHOP on 03/30/2023    Plan: 1.  IgG lambda multiple myeloma: - Revlimid  and Darzalex  on hold while treating lymphoma. - We reviewed myeloma panel from 06/08/2023.  M spike is stable at 0.1 g.  Free light chain ratio is 3.43.  Lambda light chains are 4.7.   2.  Anemia from CKD and iron  deficiency: - Last INFeD  on 04/20/2023.  Latest hemoglobin is 9.2, from myelosuppression.   3.  Bilateral leg swellings: - Continue Lasix  20 mg daily as needed.  Swelling is controlled.   4.  Pulmonary embolism (Dx 07/03/2022):: - Continue Eliquis  twice daily indefinitely.  No bleeding issues reported.   5.  Hypokalemia: - Continue potassium 40 mill equivalents daily.  Potassium today is 3.0.   6.  Diffuse large B-cell lymphoma of leg type, right leg: - PET scan on 06/02/1998 3:25 cycles showed right leg mass measures 4.1 x 2.1 cm previously 4.1 x 2.6 cm with SUV 6.4, previously 7.0.  Resolution of metabolic activity in the left shoulder musculature.  No evidence of lymphoma elsewhere. - She did not experience any significant side effects after cycle 4.  She lost about couple of pounds. - Labs today: Normal white count.  Platelet count is slightly elevated.  LFTs and creatinine are normal. - She may proceed with cycle 5 today.  RTC 3 weeks for follow-up.  Will consider I-S RT after cycle 6.    Orders Placed This Encounter  Procedures   Magnesium     Standing Status:   Future    Expected Date:   07/20/2023    Expiration Date:   07/19/2024   CBC with Differential    Standing Status:   Future    Expected Date:   07/20/2023    Expiration Date:   07/19/2024   Comprehensive metabolic panel    Standing Status:   Future    Expected Date:   07/20/2023    Expiration Date:   07/19/2024      I,[name],acting as a scribe for Paulett Boros, MD.,have documented all  relevant documentation on the behalf of Paulett Boros, MD,as directed by  Paulett Boros, MD while in the presence of Paulett Boros, MD.   I, Paulett Boros MD, have reviewed the above documentation for accuracy and completeness, and I agree with the above.   Paulett Boros, MD   5/7/20251:30 PM  CHIEF COMPLAINT:   Diagnosis: multiple myeloma, large B-cell lymphoma of leg type    Cancer Staging  Multiple myeloma (HCC) Staging form: Plasma Cell Myeloma and Plasma Cell Disorders, AJCC 8th Edition - Clinical stage from 11/16/2021: Albumin (g/dL): 3.1, ISS: Stage II, High-risk cytogenetics: Absent, LDH: Normal - Unsigned  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) Staging form: Primary Cutaneous B-Cell/T-Cell Lymphoma (Non-MF/SS Lymphoma), AJCC 8th Edition - Clinical stage from 03/01/2023: cT1a, cN0, cM0 - Unsigned    Prior Therapy: Daratumumab , Revlimid  and dexamethasone , 11/30/21 -  03/02/23, held for lymphoma treatment   Current Therapy:  R mini CHOP    HISTORY OF PRESENT ILLNESS:   Oncology History  NHL (non-Hodgkin's lymphoma) (HCC)  07/19/1995 Pathology Results   Spleen biopsy with resection- low-grade B-cell lymphoma, splenic marginal zone type   02/20/1998 Imaging   CT CAP-marked and extensive cervical adenopathy   02/25/1998 Pathology Results   Right cervical lymph node demonstrating large B-cell lymphoma   03/05/1998 Bone Marrow Biopsy   No evidence of bone marrow involvement   03/11/1998 - 07/18/1998 Chemotherapy   CHOP x 7 cycles   05/09/1998 Imaging   CT CAP and neck- slight interval decrease in size of para-aortic adenopathy with interval decrease in right inguinal and bi-iliac adenopathy.  Interval decrease in size and number of enlarged cervical nodes   07/10/1998 Imaging   Ct CAP and neck- unremarkable CT of chest. No enlarged retroperitoneal adenopathy withthe previously noted retroperitoneal nodes  currently smaller to stable in size.  Cervical adenopathy is stable to slightly smaller in size.   08/18/1998 - 09/08/1998 Chemotherapy   Rituxan  Day 1, 8, 15, 22 x 1 cycle   01/08/1999 Remission   CT CAP and neck demonstrates no adenopathy or disease   01/08/1999 Imaging   CT CAP and neck- Stable CT of chest. Stable CT abd/pelvis without adenopathy.  Negative CT of neck   Multiple myeloma (HCC)  11/16/2021 Initial Diagnosis   Multiple myeloma (HCC)   11/30/2021 - 03/02/2023 Chemotherapy   Patient is on Treatment Plan : R-CHOP 03/29/23 - MYELOMA  Daratumumab  SQ + Lenalidomide  + Dexamethasone  (DaraRd) q28d     03/29/2023 -  Chemotherapy   Patient is on Treatment Plan : NON-HODGKINS LYMPHOMA R-CHOP q21d x 4 cycles        INTERVAL HISTORY:   Leslie Williamson is a 89 y.o. female presenting to clinic today for follow up of multiple myeloma, large B-cell lymphoma of leg type. She was last seen by me on 06/08/23.  Today, she states that she is doing  well overall. Her appetite level is at 100%. Her energy level is at 75%.  PAST MEDICAL HISTORY:   Past Medical History: 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   Vertigo, labyrinthine     Surgical History: Past Surgical History:  Procedure Laterality Date   CATARACT EXTRACTION W/PHACO Left 11/03/2015   Procedure: CATARACT EXTRACTION PHACO AND INTRAOCULAR LENS  PLACEMENT (IOC);  Surgeon: Anner Kill, MD;  Location: [facility];  Service: Ophthalmology;  Laterality: Left;  CDE: 7.74   CATARACT EXTRACTION W/PHACO Right 11/20/2015   Procedure: CATARACT EXTRACTION PHACO AND INTRAOCULAR LENS PLACEMENT ; CDE:  8.30;  Surgeon: Anner Kill, MD;  Location: [facility];  Service: Ophthalmology;  Laterality: Right;   IR IMAGING GUIDED PORT INSERTION  11/04/2021   MASS EXCISION Right 02/18/2023   Procedure: Incisional Biopsy Right Lower Extremity;  Surgeon: Awilda Bogus, MD;  Location: [facility];  Service: General;  Laterality: Right;   PORT-A-CATH REMOVAL      Social History: Social History   Socioeconomic History   Marital status: Widowed    Spouse name: Not on file   Number of children: Not on file   Years of education: Not on file   Highest education level: Not on file  Occupational History   Not on file  Tobacco Use   Smoking status: Former   Smokeless tobacco: Never  Vaping Use   Vaping status: Never Used  Substance and Sexual Activity   Alcohol use: No   Drug use: No   Sexual activity: Not Currently  Other Topics Concern   Not on file  Social History Narrative   Not on file   Social Drivers of Health   Financial Resource Strain: Low Risk  (12/31/2019)   Overall Financial Resource Strain (CARDIA)    Difficulty of Paying Living Expenses: Not hard at all  Food Insecurity: No Food Insecurity (07/03/2022)   Hunger Vital Sign    Worried About Running Out of Food in the Last Year: Never true    Ran Out of Food in the Last Year: Never true  Transportation Needs: No Transportation Needs (07/03/2022)   PRAPARE - Administrator, Civil Service (Medical): No    Lack of Transportation (Non-Medical): No  Physical Activity: Inactive (12/31/2019)   Exercise Vital Sign    Days of Exercise per Week: 0 days    Minutes of Exercise per Session: 0 min  Stress: No Stress Concern Present (12/31/2019)   [employer] of Occupational Health -  Occupational Stress Questionnaire    Feeling of Stress : Not at all  Social Connections: Moderately Isolated (12/31/2019)   Social Connection and Isolation Panel [NHANES]    Frequency of Communication with Friends and Family: More than three times a week    Frequency of Social Gatherings with  Friends and Family: More than three times a week    Attends Religious Services: More than 4 times per year    Active Member of Clubs or Organizations: No    Attends Banker Meetings: Never    Marital Status: Widowed  Intimate Partner Violence: Not At Risk (07/03/2022)   Humiliation, Afraid, Rape, and Kick questionnaire    Fear of Current or Ex-Partner: No    Emotionally Abused: No    Physically Abused: No    Sexually Abused: No    Family History: Family History  Problem Relation Age of Onset   Diabetes Mother     Current Medications:  Current Outpatient Medications:    acetaminophen  (TYLENOL ) 500 MG tablet, Take 1,000 mg by mouth every 6 (six) hours as needed for moderate pain., Disp: , Rfl:    acyclovir  (ZOVIRAX ) 400 MG tablet, Take 1 tablet (400 mg total) by mouth 2 (two) times daily., Disp: 60 tablet, Rfl: 6   allopurinol  (ZYLOPRIM ) 300 MG tablet, Take 1 tablet (300 mg total) by mouth daily., Disp: 30 tablet, Rfl: 0   apixaban  (ELIQUIS ) 5 MG TABS tablet, Take 1 tablet (5 mg total) by mouth 2 (two) times daily., Disp: 60 tablet, Rfl: 1   atorvastatin  (LIPITOR) 40 MG tablet, Take 40 mg by mouth at bedtime., Disp: , Rfl:    CYCLOPHOSPHAMIDE  IV, Inject into the vein every 21 ( twenty-one) days., Disp: , Rfl:    dapagliflozin  propanediol (FARXIGA ) 10 MG TABS tablet, Take 1 tablet (10 mg total) by mouth daily., Disp: 30 tablet, Rfl: 1   dexamethasone  (DECADRON ) 4 MG tablet, Take 10 mg (2.5 tablets) by mouth weekly every Wednesday morning Per note by Dr. Cheree Cords on 09/15/2022, Disp: 20 tablet, Rfl: 6   DOXOrubicin  HCl (ADRIAMYCIN  IV), Inject into the vein every 21 ( twenty-one) days.,  Disp: , Rfl:    furosemide  (LASIX ) 40 MG tablet, Take 40 mg by mouth daily., Disp: , Rfl:    lenalidomide  (REVLIMID ) 10 MG capsule, Take 1 capsule (10 mg total) by mouth daily. Take for 21 days on, 7 days off., Disp: 21 capsule, Rfl: 0   lidocaine -prilocaine  (EMLA ) cream, Apply 1 Application topically as needed., Disp: 30 g, Rfl: 1   lidocaine -prilocaine  (EMLA ) cream, Apply to affected area once, Disp: 30 g, Rfl: 3   losartan  (COZAAR ) 25 MG tablet, Take 12.5 mg by mouth daily., Disp: , Rfl:    metoprolol  succinate (TOPROL -XL) 25 MG 24 hr tablet, Take 0.5 tablets (12.5 mg total) by mouth daily., Disp: 30 tablet, Rfl: 0   ondansetron  (ZOFRAN ) 4 MG tablet, Take 1 tablet (4 mg total) by mouth every 8 (eight) hours as needed., Disp: 30 tablet, Rfl: 1   oxyCODONE  (ROXICODONE ) 5 MG immediate release tablet, Take 1 tablet (5 mg total) by mouth every 4 (four) hours as needed for severe pain (pain score 7-10) or breakthrough pain., Disp: 5 tablet, Rfl: 0   potassium chloride  SA (KLOR-CON  M) 20 MEQ tablet, TAKE TWO TABLETS BY MOUTH DAILY, Disp: 180 tablet, Rfl: 11   predniSONE  (DELTASONE ) 20 MG tablet, Take 3 tablets (60 mg total) by mouth daily. Take with food on days 1-5 of chemotherapy., Disp: 25 tablet, Rfl: 5   predniSONE  (DELTASONE ) 5 MG tablet, Take 5 mg by mouth daily., Disp: , Rfl:    prochlorperazine  (COMPAZINE ) 10 MG tablet, Take 1 tablet (10 mg total) by mouth every 6 (six) hours as needed for nausea or vomiting., Disp: 30 tablet, Rfl: 6  promethazine-dextromethorphan  (PROMETHAZINE-DM) 6.25-15 MG/5ML syrup, Take 5 mLs by mouth every 6 (six) hours as needed., Disp: , Rfl:    riTUXimab  (RITUXAN  IV), Inject into the vein every 21 ( twenty-one) days., Disp: , Rfl:    VINCRISTINE  SULFATE IV, Inject into the vein every 21 ( twenty-one) days., Disp: , Rfl:  No current facility-administered medications for this visit.  Facility-Administered Medications Ordered in Other Visits:    0.9 %  sodium chloride   infusion, , Intravenous, Continuous, Paulett Boros, MD, Stopped at 03/29/23 1333   0.9 %  sodium chloride  infusion, , Intravenous, Continuous, Paulett Boros, MD, Last Rate: 10 mL/hr at 06/29/23 1040, New Bag at 06/29/23 1040   cyclophosphamide  (CYTOXAN ) 600 mg in sodium chloride  0.9 % 250 mL chemo infusion, 400 mg/m2 (Treatment Plan Recorded), Intravenous, Once, Raley Novicki, MD   DOXOrubicin  (ADRIAMYCIN ) chemo injection 38 mg, 25 mg/m2 (Treatment Plan Recorded), Intravenous, Once, Paulett Boros, MD   heparin  lock flush 100 unit/mL, 500 Units, Intracatheter, Once PRN, Phoenix Riesen, MD   sodium chloride  flush (NS) 0.9 % injection 10 mL, 10 mL, Intracatheter, PRN, Bjorn Hallas, MD, 10 mL at 03/29/23 1336   vinCRIStine  (ONCOVIN ) 1 mg in sodium chloride  0.9 % 50 mL chemo infusion, 1 mg, Intravenous, Once, Paulett Boros, MD   Allergies: Allergies  Allergen Reactions   Penicillins Rash    REVIEW OF SYSTEMS:   Review of Systems  Constitutional:  Negative for chills, fatigue and fever.  HENT:   Negative for lump/mass, mouth sores, nosebleeds, sore throat and trouble swallowing.   Eyes:  Negative for eye problems.  Respiratory:  Negative for cough and shortness of breath.   Cardiovascular:  Negative for chest pain, leg swelling and palpitations.  Gastrointestinal:  Negative for abdominal pain, constipation, diarrhea, nausea and vomiting.  Genitourinary:  Negative for bladder incontinence, difficulty urinating, dysuria, frequency, hematuria and nocturia.   Musculoskeletal:  Negative for arthralgias, back pain, flank pain, myalgias and neck pain.  Skin:  Negative for itching and rash.  Neurological:  Negative for dizziness, headaches and numbness.  Hematological:  Does not bruise/bleed easily.  Psychiatric/Behavioral:  Positive for sleep disturbance. Negative for depression and suicidal ideas. The patient is not nervous/anxious.   All other  systems reviewed and are negative.    VITALS:   Weight 126 lb 12.2 oz (57.5 kg).  Wt Readings from Last 3 Encounters:  06/29/23 126 lb 12.2 oz (57.5 kg)  06/08/23 128 lb (58.1 kg)  05/18/23 121 lb 9.6 oz (55.2 kg)    Body mass index is 24.76 kg/m.  Performance status (ECOG): 1 - Symptomatic but completely ambulatory  PHYSICAL EXAM:   Physical Exam Vitals and nursing note reviewed. Exam conducted with a chaperone present.  Constitutional:      Appearance: Normal appearance.  Cardiovascular:     Rate and Rhythm: Normal rate and regular rhythm.     Pulses: Normal pulses.     Heart sounds: Normal heart sounds.  Pulmonary:     Effort: Pulmonary effort is normal.     Breath sounds: Normal breath sounds.  Abdominal:     Palpations: Abdomen is soft. There is no hepatomegaly, splenomegaly or mass.     Tenderness: There is no abdominal tenderness.  Musculoskeletal:     Right lower leg: No edema.     Left lower leg: No edema.  Lymphadenopathy:     Cervical: No cervical adenopathy.     Right cervical: No superficial, deep or posterior cervical adenopathy.  Left cervical: No superficial, deep or posterior cervical adenopathy.     Upper Body:     Right upper body: No supraclavicular or axillary adenopathy.     Left upper body: No supraclavicular or axillary adenopathy.  Neurological:     General: No focal deficit present.     Mental Status: She is alert and oriented to person, place, and time.  Psychiatric:        Mood and Affect: Mood normal.        Behavior: Behavior normal.     LABS:   CBC     Component Value Date/Time   WBC 7.3 06/29/2023 0908   RBC 2.89 (L) 06/29/2023 0908   HGB 9.2 (L) 06/29/2023 0908   HCT 28.1 (L) 06/29/2023 0908   PLT 599 (H) 06/29/2023 0908   MCV 97.2 06/29/2023 0908   MCH 31.8 06/29/2023 0908   MCHC 32.7 06/29/2023 0908   RDW 19.2 (H) 06/29/2023 0908   LYMPHSABS 1.3 06/29/2023 0908   MONOABS 1.4 (H) 06/29/2023 0908   EOSABS 0.0  06/29/2023 0908   BASOSABS 0.0 06/29/2023 0908    CMP      Component Value Date/Time   NA 140 06/29/2023 0908   K 3.0 (L) 06/29/2023 0908   CL 105 06/29/2023 0908   CO2 26 06/29/2023 0908   GLUCOSE 91 06/29/2023 0908   BUN 14 06/29/2023 0908   CREATININE 0.82 06/29/2023 0908   CALCIUM  9.0 06/29/2023 0908   PROT 5.4 (L) 06/29/2023 0908   ALBUMIN 3.1 (L) 06/29/2023 0908   AST 11 (L) 06/29/2023 0908   ALT 10 06/29/2023 0908   ALKPHOS 71 06/29/2023 0908   BILITOT 0.3 06/29/2023 0908   GFRNONAA >60 06/29/2023 0908   GFRAA 43 (L) 08/01/2019 1326     No results found for: "CEA1", "CEA" / No results found for: "CEA1", "CEA" No results found for: "PSA1" No results found for: "EAV409" No results found for: "WJX914"  Lab Results  Component Value Date   TOTALPROTELP 4.3 (L) 06/08/2023   ALBUMINELP 2.8 (L) 06/08/2023   A1GS 0.2 06/08/2023   A2GS 0.4 06/08/2023   BETS 0.6 (L) 06/08/2023   GAMS 0.3 (L) 06/08/2023   MSPIKE 0.1 (H) 06/08/2023   SPEI Comment 06/08/2023   Lab Results  Component Value Date   TIBC 358 04/19/2023   TIBC 364 03/16/2023   TIBC 375 09/15/2022   FERRITIN 10 (L) 04/19/2023   FERRITIN 6 (L) 03/16/2023   FERRITIN 7 (L) 09/15/2022   IRONPCTSAT 4 (L) 04/19/2023   IRONPCTSAT 12 03/16/2023   IRONPCTSAT 5 (L) 09/15/2022   Lab Results  Component Value Date   LDH 122 03/02/2023   LDH 154 09/30/2021   LDH 148 08/05/2021     STUDIES:   NM PET Image Restage (PS) Whole Body Result Date: 06/07/2023 CLINICAL DATA:  Subsequent treatment strategy for B-cell lymphoma. EXAM: NUCLEAR MEDICINE PET WHOLE BODY TECHNIQUE: 6.15 mCi F-18 FDG was injected intravenously. Full-ring PET imaging was performed from the head to foot after the radiotracer. CT data was obtained and used for attenuation correction and anatomic localization. Fasting blood glucose: 89 mg/dl COMPARISON:  PET-CT 78/29/5621 FINDINGS: HEAD/NECK: No hypermetabolic activity in the scalp. No hypermetabolic  cervical lymph nodes. Incidental CT findings: none CHEST: No hypermetabolic mediastinal or hilar nodes. No suspicious pulmonary nodules on the CT scan. Uniform mild metabolic activity throughout the course of the esophagus is favored benign esophagitis. Incidental CT findings: Port in the anterior chest wall with tip  in distal SVC. ABDOMEN/PELVIS: No abnormal hypermetabolic activity within the liver, pancreas, or adrenal glands. No hypermetabolic lymph nodes in the abdomen or pelvis. Incidental CT findings: Post splenectomy. No change in benign fluid collection along the RIGHT pericolic gutter. SKELETON: No radiotracer avid lesions in the is skeleton. Interval resolution of previous described metabolic activity in the LEFT shoulder musculature. Incidental CT findings: none EXTREMITIES: Hypermetabolic soft tissue mass in the medial RIGHT lower extremity again demonstrated measuring 4.1 x 2.1 cm with SUV max equal 6.4 (image 375) compared to 4.1 x 2.6 cm mass with SUV max equal 7.0 comparison PET-CT scan. Incidental CT findings: none IMPRESSION: 1. No significant change in hypermetabolic soft tissue mass in the medial aspect of the RIGHT lower extremity. 2. No evidence of lymphoma recurrence in the neck, chest, abdomen, or pelvis. 3. Interval resolution of metabolic activity in the LEFT shoulder musculature. 4. Post splenectomy. Electronically Signed   By: Deboraha Fallow M.D.   On: 06/07/2023 11:44

## 2023-06-29 ENCOUNTER — Inpatient Hospital Stay: Admitting: Hematology

## 2023-06-29 ENCOUNTER — Inpatient Hospital Stay

## 2023-06-29 ENCOUNTER — Inpatient Hospital Stay: Attending: Hematology

## 2023-06-29 VITALS — BP 121/67 | HR 90 | Temp 98.1°F | Resp 18

## 2023-06-29 VITALS — Wt 126.8 lb

## 2023-06-29 VITALS — BP 106/58 | HR 75 | Temp 97.0°F | Resp 18

## 2023-06-29 DIAGNOSIS — I130 Hypertensive heart and chronic kidney disease with heart failure and stage 1 through stage 4 chronic kidney disease, or unspecified chronic kidney disease: Secondary | ICD-10-CM | POA: Diagnosis not present

## 2023-06-29 DIAGNOSIS — Z7961 Long term (current) use of immunomodulator: Secondary | ICD-10-CM | POA: Diagnosis not present

## 2023-06-29 DIAGNOSIS — C8335 Diffuse large B-cell lymphoma, lymph nodes of inguinal region and lower limb: Secondary | ICD-10-CM | POA: Insufficient documentation

## 2023-06-29 DIAGNOSIS — Z7952 Long term (current) use of systemic steroids: Secondary | ICD-10-CM | POA: Insufficient documentation

## 2023-06-29 DIAGNOSIS — M7989 Other specified soft tissue disorders: Secondary | ICD-10-CM | POA: Diagnosis not present

## 2023-06-29 DIAGNOSIS — D75839 Thrombocytosis, unspecified: Secondary | ICD-10-CM | POA: Diagnosis not present

## 2023-06-29 DIAGNOSIS — I2699 Other pulmonary embolism without acute cor pulmonale: Secondary | ICD-10-CM | POA: Insufficient documentation

## 2023-06-29 DIAGNOSIS — Z5111 Encounter for antineoplastic chemotherapy: Secondary | ICD-10-CM | POA: Diagnosis not present

## 2023-06-29 DIAGNOSIS — C9000 Multiple myeloma not having achieved remission: Secondary | ICD-10-CM

## 2023-06-29 DIAGNOSIS — Z5189 Encounter for other specified aftercare: Secondary | ICD-10-CM | POA: Insufficient documentation

## 2023-06-29 DIAGNOSIS — D509 Iron deficiency anemia, unspecified: Secondary | ICD-10-CM | POA: Diagnosis not present

## 2023-06-29 DIAGNOSIS — E669 Obesity, unspecified: Secondary | ICD-10-CM | POA: Insufficient documentation

## 2023-06-29 DIAGNOSIS — E876 Hypokalemia: Secondary | ICD-10-CM | POA: Diagnosis not present

## 2023-06-29 DIAGNOSIS — E7800 Pure hypercholesterolemia, unspecified: Secondary | ICD-10-CM | POA: Diagnosis not present

## 2023-06-29 DIAGNOSIS — N189 Chronic kidney disease, unspecified: Secondary | ICD-10-CM | POA: Insufficient documentation

## 2023-06-29 DIAGNOSIS — Z9081 Acquired absence of spleen: Secondary | ICD-10-CM | POA: Insufficient documentation

## 2023-06-29 DIAGNOSIS — Z79899 Other long term (current) drug therapy: Secondary | ICD-10-CM | POA: Insufficient documentation

## 2023-06-29 DIAGNOSIS — I739 Peripheral vascular disease, unspecified: Secondary | ICD-10-CM | POA: Diagnosis not present

## 2023-06-29 DIAGNOSIS — Z95828 Presence of other vascular implants and grafts: Secondary | ICD-10-CM

## 2023-06-29 LAB — COMPREHENSIVE METABOLIC PANEL WITH GFR
ALT: 10 U/L (ref 0–44)
AST: 11 U/L — ABNORMAL LOW (ref 15–41)
Albumin: 3.1 g/dL — ABNORMAL LOW (ref 3.5–5.0)
Alkaline Phosphatase: 71 U/L (ref 38–126)
Anion gap: 9 (ref 5–15)
BUN: 14 mg/dL (ref 8–23)
CO2: 26 mmol/L (ref 22–32)
Calcium: 9 mg/dL (ref 8.9–10.3)
Chloride: 105 mmol/L (ref 98–111)
Creatinine, Ser: 0.82 mg/dL (ref 0.44–1.00)
GFR, Estimated: >60 mL/min (ref >60)
Glucose, Bld: 91 mg/dL (ref 70–99)
Potassium: 3 mmol/L — ABNORMAL LOW (ref 3.5–5.1)
Sodium: 140 mmol/L (ref 135–145)
Total Bilirubin: 0.3 mg/dL (ref 0.0–1.2)
Total Protein: 5.4 g/dL — ABNORMAL LOW (ref 6.5–8.1)

## 2023-06-29 LAB — CBC WITH DIFFERENTIAL/PLATELET
Abs Immature Granulocytes: 0.04 10*3/uL (ref 0.00–0.07)
Basophils Absolute: 0 10*3/uL (ref 0.0–0.1)
Basophils Relative: 0 %
Eosinophils Absolute: 0 10*3/uL (ref 0.0–0.5)
Eosinophils Relative: 0 %
HCT: 28.1 % — ABNORMAL LOW (ref 36.0–46.0)
Hemoglobin: 9.2 g/dL — ABNORMAL LOW (ref 12.0–15.0)
Immature Granulocytes: 1 %
Lymphocytes Relative: 18 %
Lymphs Abs: 1.3 10*3/uL (ref 0.7–4.0)
MCH: 31.8 pg (ref 26.0–34.0)
MCHC: 32.7 g/dL (ref 30.0–36.0)
MCV: 97.2 fL (ref 80.0–100.0)
Monocytes Absolute: 1.4 10*3/uL — ABNORMAL HIGH (ref 0.1–1.0)
Monocytes Relative: 19 %
Neutro Abs: 4.5 10*3/uL (ref 1.7–7.7)
Neutrophils Relative %: 62 %
Platelets: 599 10*3/uL — ABNORMAL HIGH (ref 150–400)
RBC: 2.89 MIL/uL — ABNORMAL LOW (ref 3.87–5.11)
RDW: 19.2 % — ABNORMAL HIGH (ref 11.5–15.5)
WBC: 7.3 10*3/uL (ref 4.0–10.5)
nRBC: 0 % (ref 0.0–0.2)

## 2023-06-29 LAB — MAGNESIUM: Magnesium: 1.9 mg/dL (ref 1.7–2.4)

## 2023-06-29 MED ORDER — CETIRIZINE HCL 10 MG/ML IV SOLN
10.0000 mg | Freq: Once | INTRAVENOUS | Status: AC
  Administered 2023-06-29: 10 mg via INTRAVENOUS
  Filled 2023-06-29: qty 1

## 2023-06-29 MED ORDER — SODIUM CHLORIDE 0.9 % IV SOLN
375.0000 mg/m2 | Freq: Once | INTRAVENOUS | Status: AC
  Administered 2023-06-29: 600 mg via INTRAVENOUS
  Filled 2023-06-29: qty 50

## 2023-06-29 MED ORDER — SODIUM CHLORIDE 0.9 % IV SOLN
400.0000 mg/m2 | Freq: Once | INTRAVENOUS | Status: AC
  Administered 2023-06-29: 600 mg via INTRAVENOUS
  Filled 2023-06-29: qty 30

## 2023-06-29 MED ORDER — DEXAMETHASONE SODIUM PHOSPHATE 10 MG/ML IJ SOLN
10.0000 mg | Freq: Once | INTRAMUSCULAR | Status: AC
  Administered 2023-06-29: 10 mg via INTRAVENOUS
  Filled 2023-06-29: qty 1

## 2023-06-29 MED ORDER — POTASSIUM CHLORIDE CRYS ER 20 MEQ PO TBCR
40.0000 meq | EXTENDED_RELEASE_TABLET | Freq: Once | ORAL | Status: AC
  Administered 2023-06-29: 40 meq via ORAL
  Filled 2023-06-29: qty 2

## 2023-06-29 MED ORDER — FAMOTIDINE IN NACL 20-0.9 MG/50ML-% IV SOLN
20.0000 mg | Freq: Once | INTRAVENOUS | Status: AC
  Administered 2023-06-29: 20 mg via INTRAVENOUS
  Filled 2023-06-29: qty 50

## 2023-06-29 MED ORDER — DOXORUBICIN HCL CHEMO IV INJECTION 2 MG/ML
25.0000 mg/m2 | Freq: Once | INTRAVENOUS | Status: AC
Start: 2023-06-29 — End: 2023-06-29
  Administered 2023-06-29: 38 mg via INTRAVENOUS
  Filled 2023-06-29: qty 19

## 2023-06-29 MED ORDER — HEPARIN SOD (PORK) LOCK FLUSH 100 UNIT/ML IV SOLN
500.0000 [IU] | Freq: Once | INTRAVENOUS | Status: AC | PRN
  Administered 2023-06-29: 500 [IU]

## 2023-06-29 MED ORDER — SODIUM CHLORIDE 0.9 % IV SOLN: INTRAVENOUS | Status: DC

## 2023-06-29 MED ORDER — PALONOSETRON HCL INJECTION 0.25 MG/5ML
0.2500 mg | Freq: Once | INTRAVENOUS | Status: AC
  Administered 2023-06-29: 0.25 mg via INTRAVENOUS
  Filled 2023-06-29: qty 5

## 2023-06-29 MED ORDER — VINCRISTINE SULFATE CHEMO INJECTION 1 MG/ML
1.0000 mg | Freq: Once | INTRAVENOUS | Status: AC
  Administered 2023-06-29: 1 mg via INTRAVENOUS
  Filled 2023-06-29: qty 1

## 2023-06-29 MED ORDER — SODIUM CHLORIDE 0.9 % IV SOLN
150.0000 mg | Freq: Once | INTRAVENOUS | Status: AC
  Administered 2023-06-29: 150 mg via INTRAVENOUS
  Filled 2023-06-29: qty 5

## 2023-06-29 MED ORDER — ACETAMINOPHEN 325 MG PO TABS
650.0000 mg | ORAL_TABLET | Freq: Once | ORAL | Status: AC
Start: 2023-06-29 — End: 2023-06-29
  Administered 2023-06-29: 650 mg via ORAL
  Filled 2023-06-29: qty 2

## 2023-06-29 MED ORDER — SODIUM CHLORIDE FLUSH 0.9 % IV SOLN
10.0000 mL | Freq: Once | INTRAVENOUS | Status: AC
  Administered 2023-06-29: 10 mL via INTRAVENOUS
  Filled 2023-06-29: qty 10

## 2023-06-29 NOTE — Progress Notes (Signed)
 Patient presents today for treatment and follow up visit with Dr. Cheree Cords. Labs and vital signs within parameters for treatment. Potassium 3.0. Standing orders followed. Patient will receive 40 mEq PO potassium .

## 2023-06-29 NOTE — Progress Notes (Signed)
 Patient tolerated chemotherapy with no complaints voiced.  Side effects with management reviewed with understanding verbalized.  Port site clean and dry with no bruising or swelling noted at site.  Good blood return noted before and after administration of chemotherapy.  Band aid applied.  Patient left in satisfactory condition with VSS and no s/s of distress noted. All follow ups as scheduled.   Leslie Williamson [employer]

## 2023-06-29 NOTE — Patient Instructions (Signed)
 [facility] [place] - A DEPT OF [facility]  Discharge Instructions: Thank you for choosing [facility] to provide your oncology and hematology care.  If you have a lab appointment with the Cancer Center - please note that after April 8th, 2024, all labs will be drawn in the cancer center.  You do not have to check in or register with the main entrance as you have in the past but will complete your check-in in the cancer center.  Wear comfortable clothing and clothing appropriate for easy access to any Portacath or PICC line.   We strive to give you quality time with your provider. You may need to reschedule your appointment if you arrive late (15 or more minutes).  Arriving late affects you and other patients whose appointments are after yours.  Also, if you miss three or more appointments without notifying the office, you may be dismissed from the clinic at the provider's discretion.      For prescription refill requests, have your pharmacy contact our office and allow 72 hours for refills to be completed.    Today you received the following chemotherapy and/or immunotherapy agents Vincritine, cytoxan , adriamycin , ruxience    To help prevent nausea and vomiting after your treatment, we encourage you to take your nausea medication as directed.  BELOW ARE SYMPTOMS THAT SHOULD BE REPORTED IMMEDIATELY: *FEVER GREATER THAN 100.4 F (38 C) OR HIGHER *CHILLS OR SWEATING *NAUSEA AND VOMITING THAT IS NOT CONTROLLED WITH YOUR NAUSEA MEDICATION *UNUSUAL SHORTNESS OF BREATH *UNUSUAL BRUISING OR BLEEDING *URINARY PROBLEMS (pain or burning when urinating, or frequent urination) *BOWEL PROBLEMS (unusual diarrhea, constipation, pain near the anus) TENDERNESS IN MOUTH AND THROAT WITH OR WITHOUT PRESENCE OF ULCERS (sore throat, sores in mouth, or a toothache) UNUSUAL RASH, SWELLING OR PAIN  UNUSUAL VAGINAL DISCHARGE OR ITCHING   Items with * indicate a potential  emergency and should be followed up as soon as possible or go to the Emergency Department if any problems should occur.  Please show the CHEMOTHERAPY ALERT CARD or IMMUNOTHERAPY ALERT CARD at check-in to the Emergency Department and triage nurse.  Should you have questions after your visit or need to cancel or reschedule your appointment, please contact [facility] [place] - A DEPT OF [facility] [phone]  and follow the prompts.  Office hours are 8:00 a.m. to 4:30 p.m. Monday - Friday. Please note that voicemails left after 4:00 p.m. may not be returned until the following business day.  We are closed weekends and major holidays. You have access to a nurse at all times for urgent questions. Please call the main number to the clinic [phone] and follow the prompts.  For any non-urgent questions, you may also contact your provider using MyChart. We now offer e-Visits for anyone 37 and older to request care online for non-urgent symptoms. For details visit [email or web address].   Also download the MyChart app! Go to the app store, search "MyChart", open the app, select [place], and log in with your MyChart username and password.

## 2023-06-29 NOTE — Progress Notes (Signed)
 Patient has been examined by Dr. Ellin Saba. Vital signs and labs have been reviewed by MD - ANC, Creatinine, LFTs, hemoglobin, and platelets are within treatment parameters per M.D. - pt may proceed with treatment.  Primary RN and pharmacy notified.

## 2023-06-29 NOTE — Patient Instructions (Signed)

## 2023-07-01 ENCOUNTER — Inpatient Hospital Stay

## 2023-07-01 VITALS — BP 116/57 | HR 66 | Temp 97.0°F | Resp 18

## 2023-07-01 DIAGNOSIS — Z7961 Long term (current) use of immunomodulator: Secondary | ICD-10-CM | POA: Diagnosis not present

## 2023-07-01 DIAGNOSIS — Z79899 Other long term (current) drug therapy: Secondary | ICD-10-CM | POA: Diagnosis not present

## 2023-07-01 DIAGNOSIS — E669 Obesity, unspecified: Secondary | ICD-10-CM | POA: Diagnosis not present

## 2023-07-01 DIAGNOSIS — E7800 Pure hypercholesterolemia, unspecified: Secondary | ICD-10-CM | POA: Diagnosis not present

## 2023-07-01 DIAGNOSIS — C9000 Multiple myeloma not having achieved remission: Secondary | ICD-10-CM

## 2023-07-01 DIAGNOSIS — Z5111 Encounter for antineoplastic chemotherapy: Secondary | ICD-10-CM | POA: Diagnosis not present

## 2023-07-01 DIAGNOSIS — D509 Iron deficiency anemia, unspecified: Secondary | ICD-10-CM | POA: Diagnosis not present

## 2023-07-01 DIAGNOSIS — Z9081 Acquired absence of spleen: Secondary | ICD-10-CM | POA: Diagnosis not present

## 2023-07-01 DIAGNOSIS — I130 Hypertensive heart and chronic kidney disease with heart failure and stage 1 through stage 4 chronic kidney disease, or unspecified chronic kidney disease: Secondary | ICD-10-CM | POA: Diagnosis not present

## 2023-07-01 DIAGNOSIS — N189 Chronic kidney disease, unspecified: Secondary | ICD-10-CM | POA: Diagnosis not present

## 2023-07-01 DIAGNOSIS — D75839 Thrombocytosis, unspecified: Secondary | ICD-10-CM | POA: Diagnosis not present

## 2023-07-01 DIAGNOSIS — E876 Hypokalemia: Secondary | ICD-10-CM | POA: Diagnosis not present

## 2023-07-01 DIAGNOSIS — C8335 Diffuse large B-cell lymphoma, lymph nodes of inguinal region and lower limb: Secondary | ICD-10-CM | POA: Diagnosis not present

## 2023-07-01 DIAGNOSIS — I739 Peripheral vascular disease, unspecified: Secondary | ICD-10-CM | POA: Diagnosis not present

## 2023-07-01 DIAGNOSIS — Z7952 Long term (current) use of systemic steroids: Secondary | ICD-10-CM | POA: Diagnosis not present

## 2023-07-01 DIAGNOSIS — M7989 Other specified soft tissue disorders: Secondary | ICD-10-CM | POA: Diagnosis not present

## 2023-07-01 DIAGNOSIS — Z5189 Encounter for other specified aftercare: Secondary | ICD-10-CM | POA: Diagnosis not present

## 2023-07-01 DIAGNOSIS — I2699 Other pulmonary embolism without acute cor pulmonale: Secondary | ICD-10-CM | POA: Diagnosis not present

## 2023-07-01 MED ORDER — PEGFILGRASTIM-CBQV 6 MG/0.6ML ~~LOC~~ SOSY
6.0000 mg | PREFILLED_SYRINGE | Freq: Once | SUBCUTANEOUS | Status: AC
  Administered 2023-07-01: 6 mg via SUBCUTANEOUS
  Filled 2023-07-01: qty 0.6

## 2023-07-01 NOTE — Patient Instructions (Signed)
 [facility] - A DEPT OF [facility]  Discharge Instructions: Thank you for choosing [facility] to provide your oncology and hematology care.  If you have a lab appointment with the Cancer Center - please note that after April 8th, 2024, all labs will be drawn in the cancer center.  You do not have to check in or register with the main entrance as you have in the past but will complete your check-in in the cancer center.  Wear comfortable clothing and clothing appropriate for easy access to any Portacath or PICC line.   We strive to give you quality time with your provider. You may need to reschedule your appointment if you arrive late (15 or more minutes).  Arriving late affects you and other patients whose appointments are after yours.  Also, if you miss three or more appointments without notifying the office, you may be dismissed from the clinic at the provider's discretion.      For prescription refill requests, have your pharmacy contact our office and allow 72 hours for refills to be completed.    Today you received the following injection: Udenyca   To help prevent nausea and vomiting after your treatment, we encourage you to take your nausea medication as directed.  BELOW ARE SYMPTOMS THAT SHOULD BE REPORTED IMMEDIATELY: *FEVER GREATER THAN 100.4 F (38 C) OR HIGHER *CHILLS OR SWEATING *NAUSEA AND VOMITING THAT IS NOT CONTROLLED WITH YOUR NAUSEA MEDICATION *UNUSUAL SHORTNESS OF BREATH *UNUSUAL BRUISING OR BLEEDING *URINARY PROBLEMS (pain or burning when urinating, or frequent urination) *BOWEL PROBLEMS (unusual diarrhea, constipation, pain near the anus) TENDERNESS IN MOUTH AND THROAT WITH OR WITHOUT PRESENCE OF ULCERS (sore throat, sores in mouth, or a toothache) UNUSUAL RASH, SWELLING OR PAIN  UNUSUAL VAGINAL DISCHARGE OR ITCHING   Items with * indicate a potential emergency and should be followed up as soon as possible or go to the  Emergency Department if any problems should occur.  Please show the CHEMOTHERAPY ALERT CARD or IMMUNOTHERAPY ALERT CARD at check-in to the Emergency Department and triage nurse.  Should you have questions after your visit or need to cancel or reschedule your appointment, please contact [facility] [place] - A DEPT OF [facility] [phone]  and follow the prompts.  Office hours are 8:00 a.m. to 4:30 p.m. Monday - Friday. Please note that voicemails left after 4:00 p.m. may not be returned until the following business day.  We are closed weekends and major holidays. You have access to a nurse at all times for urgent questions. Please call the main number to the clinic [phone] and follow the prompts.  For any non-urgent questions, you may also contact your provider using MyChart. We now offer e-Visits for anyone 65 and older to request care online for non-urgent symptoms. For details visit [email or web address].   Also download the MyChart app! Go to the app store, search "MyChart", open the app, select [place], and log in with your MyChart username and password.

## 2023-07-01 NOTE — Progress Notes (Signed)
 Patient came in today for injection, also complaining of severe constipation. LBM was 3 days ago. She stated how painful it was for her to try to have a BM lately. Stool softener not working. Requesting something to help with this. Dr. Orvis Blare notified. Will instruct her on Daily Miralax  as needed and take Magnesium  Citrate today to help facilitate  the constipation. Advised pt to call Monday if not any better.    Udenyca  injection  given per orders. Patient tolerated it well without problems. Vitals stable and discharged home from clinic ambulatory. Follow up as scheduled.

## 2023-07-19 NOTE — Progress Notes (Signed)
 [facility] [address], [place] [address]    Clinic Day:  07/20/2023  Referring physician: [name], [name] Demissie*  Patient Care Team: Wyvonna Heidelberg, MD as PCP - General (Internal Medicine) Amanda Jungling Joyceann No, MD as PCP - Cardiology (Cardiology) Paulett Boros, MD as Medical Oncologist (Medical Oncology)   ASSESSMENT & PLAN:   Assessment: 1.  Biclonal IgG lambda and IgM kappa multiple myeloma, high risk: - BMBX (11/02/2021): IgG lambda plasma cells 70% - Myeloma FISH panel: Monosomy 13 (standard GIST), duplication of 1 q. (high risk), gain of 11 q. (standard risk). - Cytogenetics: Hyperdiploid E weight gain (trisomy/trisomy) of odd-numbered chromosomes and monosomy 13, standard risk.  Gain of 1 q. associated with progressive course. - PET scan (11/05/2021): No evidence of multiple myeloma.  No hypermetabolic adenopathy. - Daratumumab , Revlimid  and dexamethasone  cycle 1 started on 11/30/2021   2.  JAK2 positive essential thrombocytosis: - BMBX (11/02/2021): No evidence of fibrosis.  No morphological evidence of JAK2 positive ET.   3.  History of marginal zone lymphoma of the spleen: - Diagnosed in 1999, status post CHOP x7 cycles followed by rituximab  1 cycle. - Status post splenectomy.  No evidence of lymphoma on current PET scan.   4.  Social/family history: - She lives with her sister.  Son lives 30 minutes away.  She is independent of ADLs and some IADLs.  She drives.  She worked at [employer] and denies any exposure to chemicals or pesticides.  No family history of malignancies.   5.  Bone protection (DEXA scan 02/17/2022 T-score -0.9): - She saw dentist who recommended dental extractions.  She does not want zoledronic acid or denosumab.   6.  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 - Initial needle biopsy on  01/19/2023 showed malignant hematopoietic neoplasm. - Excision biopsy on 02/18/2023: Diffuse large B-cell lymphoma. - Cycle 1 of R mini CHOP on 03/30/2023    Plan: 1.  IgG lambda multiple myeloma: - Revlimid  and Darzalex  on hold while treating lymphoma. - MM panel on 06/08/2023 with M spike stable at 0.1 g. - Will repeat MM labs prior to next visit.   2.  Anemia from CKD and iron  deficiency: - Last INFeD  on 04/20/2023.  Hemoglobin today is 8.7.  Will check ferritin and iron  panel today.   3.  Bilateral leg swellings: - Lasix  20 mg in the morning/daily as needed.   4.  Pulmonary embolism (Dx 07/03/2022):: - Continue Eliquis  twice daily indefinitely.  No bleeding issues reported.   5.  Hypokalemia: - Continue K-Dur 40 mill equivalents daily.  Potassium today is 3.2.   6.  Diffuse large B-cell lymphoma of leg type, right leg: - PET scan on 06/02/2023, after 3 cycles showed right leg mass 4.1 x 2.1 cm, previously 4.1 x 2.6 cm with SUV 6.4, previously 7.  Resolution of metabolic activity in the left shoulder musculature.  No of the lymphoma seen. - She has tolerated last cycle reasonably well.  She had some constipation after treatment.  She was told to take stool softener. - Today labs showed normal creatinine and LFTs.  CBC grossly normal. - She may proceed with cycle 6 today.  She will continue prednisone  60 mg for the next 4 days. - Recommend follow-up in 4 weeks with repeat PET scan.  We will make a referral  for I-S RT at that time.    Orders Placed This Encounter  Procedures   NM PET Image Restage (PS) Whole Body    Standing Status:   Future    Expected Date:   08/20/2023    Expiration Date:   07/19/2024    If indicated for the ordered procedure, I authorize the administration of a radiopharmaceutical per Radiology protocol:   Yes    Preferred imaging location?:   [facility]   Kappa/lambda light chains    Standing Status:   Future    Expected Date:   08/11/2023    Expiration Date:    07/19/2024   Immunofixation electrophoresis    Standing Status:   Future    Expected Date:   08/11/2023    Expiration Date:   07/19/2024   Protein electrophoresis, serum    Standing Status:   Future    Expected Date:   08/11/2023    Expiration Date:   07/19/2024      Leslie Williamson,acting as a scribe for Paulett Boros, MD.,have documented all relevant documentation on the behalf of Paulett Boros, MD,as directed by  Paulett Boros, MD while in the presence of Paulett Boros, MD.  I, Paulett Boros MD, have reviewed the above documentation for accuracy and completeness, and I agree with the above.    Paulett Boros, MD   5/28/20259:59 AM  CHIEF COMPLAINT:   Diagnosis: multiple myeloma, large B-cell lymphoma of leg type    Cancer Staging  Multiple myeloma (HCC) Staging form: Plasma Cell Myeloma and Plasma Cell Disorders, AJCC 8th Edition - Clinical stage from 11/16/2021: Albumin (g/dL): 3.1, ISS: Stage II, High-risk cytogenetics: Absent, LDH: Normal - Unsigned  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) Staging form: Primary Cutaneous B-Cell/T-Cell Lymphoma (Non-MF/SS Lymphoma), AJCC 8th Edition - Clinical stage from 03/01/2023: cT1a, cN0, cM0 - Unsigned    Prior Therapy: Daratumumab , Revlimid  and dexamethasone , 11/30/21 -  03/02/23, held for lymphoma treatment   Current Therapy:  R mini CHOP    HISTORY OF PRESENT ILLNESS:   Oncology History  NHL (non-Hodgkin's lymphoma) (HCC)  07/19/1995 Pathology Results   Spleen biopsy with resection- low-grade B-cell lymphoma, splenic marginal zone type   02/20/1998 Imaging   CT CAP-marked and extensive cervical adenopathy   02/25/1998 Pathology Results   Right cervical lymph node demonstrating large B-cell lymphoma   03/05/1998 Bone Marrow Biopsy   No evidence of bone marrow involvement   03/11/1998 - 07/18/1998 Chemotherapy   CHOP x 7 cycles   05/09/1998 Imaging   CT  CAP and neck- slight interval decrease in size of para-aortic adenopathy with interval decrease in right inguinal and bi-iliac adenopathy.  Interval decrease in size and number of enlarged cervical nodes   07/10/1998 Imaging   Ct CAP and neck- unremarkable CT of chest. No enlarged retroperitoneal adenopathy withthe previously noted retroperitoneal nodes currently smaller to stable in size.  Cervical adenopathy is stable to slightly smaller in size.   08/18/1998 - 09/08/1998 Chemotherapy   Rituxan  Day 1, 8, 15, 22 x 1 cycle   01/08/1999 Remission   CT CAP and neck demonstrates no adenopathy or disease   01/08/1999 Imaging   CT CAP and neck- Stable CT of chest. Stable CT abd/pelvis without adenopathy.  Negative CT of neck   Multiple myeloma (HCC)  11/16/2021 Initial Diagnosis   Multiple myeloma (HCC)   11/30/2021 - 03/02/2023 Chemotherapy   Patient is on Treatment Plan : R-CHOP 03/29/23 - MYELOMA  Daratumumab  SQ + Lenalidomide  + Dexamethasone  (DaraRd) q28d     03/29/2023 -  Chemotherapy   Patient is on Treatment Plan : NON-HODGKINS LYMPHOMA R-CHOP q21d x 4 cycles        INTERVAL HISTORY:   Leslie Williamson is a 89 y.o. female presenting to clinic today for follow up of multiple myeloma, large B-cell lymphoma of leg type. She was last seen by me on 06/29/23.  Today, she states that she is doing well overall. Her appetite level is at 100%. Her energy level is at 7 5%.  PAST MEDICAL HISTORY:   Past Medical History: Past Medical History:  Diagnosis Date   Anxiety    Aortic atherosclerosis (HCC)    Complication of anesthesia    difficult to wake up   Depression    Heart failure with mildly reduced ejection fraction (HFmrEF) (HCC)    Hypercholesteremia    Hypertension    Hypokalemia    Iron  deficiency anemia    Left hip pain    NHL (non-Hodgkin's lymphoma) (HCC) 12/17/2010   Obesity    Primary thrombocytosis (HCC) 01/11/2006   Secondary to splenectomy     PVD (peripheral vascular disease) (HCC)     S/P splenectomy 01/01/2014   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)    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   Vertigo, labyrinthine     Surgical History: Past Surgical History:  Procedure Laterality Date   CATARACT EXTRACTION W/PHACO Left 11/03/2015   Procedure: CATARACT EXTRACTION PHACO AND INTRAOCULAR LENS PLACEMENT (IOC);  Surgeon: Anner Kill, MD;  Location: [facility];  Service: Ophthalmology;  Laterality: Left;  CDE: 7.74   CATARACT EXTRACTION W/PHACO Right 11/20/2015   Procedure: CATARACT EXTRACTION PHACO AND INTRAOCULAR LENS PLACEMENT ; CDE:  8.30;  Surgeon: Anner Kill, MD;  Location: [facility];  Service: Ophthalmology;  Laterality: Right;   IR IMAGING GUIDED PORT INSERTION  11/04/2021   MASS EXCISION Right 02/18/2023   Procedure: Incisional Biopsy Right Lower Extremity;  Surgeon: Awilda Bogus, MD;  Location: [facility];  Service: General;  Laterality: Right;   PORT-A-CATH REMOVAL      Social History: Social History   Socioeconomic History   Marital status: Widowed    Spouse name: Not on file   Number of children: Not on file   Years of education: Not on file   Highest education level: Not on file  Occupational History   Not on file  Tobacco Use   Smoking status: Former   Smokeless tobacco: Never  Vaping Use   Vaping status: Never Used  Substance and Sexual Activity   Alcohol  use: No   Drug use: No   Sexual activity: Not Currently  Other Topics Concern   Not on file  Social History Narrative   Not on file   Social Drivers of Health   Financial Resource Strain: Low Risk  (12/31/2019)   Overall Financial Resource Strain (CARDIA)    Difficulty of Paying Living Expenses: Not hard at all  Food Insecurity: No Food Insecurity (07/03/2022)   Hunger Vital Sign    Worried About Running Out of Food in the Last Year: Never true    Ran Out of Food in the Last Year: Never true  Transportation Needs: No Transportation Needs (07/03/2022)    PRAPARE - Administrator, Civil Service (Medical): No    Lack of Transportation (Non-Medical): No  Physical Activity: Inactive (12/31/2019)   Exercise Vital Sign    Days of  Exercise per Week: 0 days    Minutes of Exercise per Session: 0 min  Stress: No Stress Concern Present (12/31/2019)   [employer] of Occupational Health - Occupational Stress Questionnaire    Feeling of Stress : Not at all  Social Connections: Moderately Isolated (12/31/2019)   Social Connection and Isolation Panel [NHANES]    Frequency of Communication with Friends and Family: More than three times a week    Frequency of Social Gatherings with Friends and Family: More than three times a week    Attends Religious Services: More than 4 times per year    Active Member of [employer] or Organizations: No    Attends Banker Meetings: Never    Marital Status: Widowed  Intimate Partner Violence: Not At Risk (07/03/2022)   Humiliation, Afraid, Rape, and Kick questionnaire    Fear of Current or Ex-Partner: No    Emotionally Abused: No    Physically Abused: No    Sexually Abused: No    Family History: Family History  Problem Relation Age of Onset   Diabetes Mother     Current Medications:  Current Outpatient Medications:    acetaminophen  (TYLENOL ) 500 MG tablet, Take 1,000 mg by mouth every 6 (six) hours as needed for moderate pain., Disp: , Rfl:    acyclovir  (ZOVIRAX ) 400 MG tablet, Take 1 tablet (400 mg total) by mouth 2 (two) times daily., Disp: 60 tablet, Rfl: 6   allopurinol  (ZYLOPRIM ) 300 MG tablet, Take 1 tablet (300 mg total) by mouth daily., Disp: 30 tablet, Rfl: 0   apixaban  (ELIQUIS ) 5 MG TABS tablet, Take 1 tablet (5 mg total) by mouth 2 (two) times daily., Disp: 60 tablet, Rfl: 1   atorvastatin  (LIPITOR) 40 MG tablet, Take 40 mg by mouth at bedtime., Disp: , Rfl:    CYCLOPHOSPHAMIDE  IV, Inject into the vein every 21 ( twenty-one) days., Disp: , Rfl:    dapagliflozin  propanediol  (FARXIGA ) 10 MG TABS tablet, Take 1 tablet (10 mg total) by mouth daily., Disp: 30 tablet, Rfl: 1   dexamethasone  (DECADRON ) 4 MG tablet, Take 10 mg (2.5 tablets) by mouth weekly every Wednesday morning Per note by Dr. Cheree Cords on 09/15/2022, Disp: 20 tablet, Rfl: 6   DOXOrubicin  HCl (ADRIAMYCIN  IV), Inject into the vein every 21 ( twenty-one) days., Disp: , Rfl:    furosemide  (LASIX ) 40 MG tablet, Take 40 mg by mouth daily., Disp: , Rfl:    lenalidomide  (REVLIMID ) 10 MG capsule, Take 1 capsule (10 mg total) by mouth daily. Take for 21 days on, 7 days off., Disp: 21 capsule, Rfl: 0   lidocaine -prilocaine  (EMLA ) cream, Apply 1 Application topically as needed., Disp: 30 g, Rfl: 1   lidocaine -prilocaine  (EMLA ) cream, Apply to affected area once, Disp: 30 g, Rfl: 3   losartan  (COZAAR ) 25 MG tablet, Take 12.5 mg by mouth daily., Disp: , Rfl:    metoprolol  succinate (TOPROL -XL) 25 MG 24 hr tablet, Take 0.5 tablets (12.5 mg total) by mouth daily., Disp: 30 tablet, Rfl: 0   ondansetron  (ZOFRAN ) 4 MG tablet, Take 1 tablet (4 mg total) by mouth every 8 (eight) hours as needed., Disp: 30 tablet, Rfl: 1   oxyCODONE  (ROXICODONE ) 5 MG immediate release tablet, Take 1 tablet (5 mg total) by mouth every 4 (four) hours as needed for severe pain (pain score 7-10) or breakthrough pain., Disp: 5 tablet, Rfl: 0   potassium chloride  SA (KLOR-CON  M) 20 MEQ tablet, TAKE TWO TABLETS BY MOUTH DAILY, Disp: 180  tablet, Rfl: 11   predniSONE  (DELTASONE ) 20 MG tablet, Take 3 tablets (60 mg total) by mouth daily. Take with food on days 1-5 of chemotherapy., Disp: 25 tablet, Rfl: 5   prochlorperazine  (COMPAZINE ) 10 MG tablet, Take 1 tablet (10 mg total) by mouth every 6 (six) hours as needed for nausea or vomiting., Disp: 30 tablet, Rfl: 6   promethazine-dextromethorphan  (PROMETHAZINE-DM) 6.25-15 MG/5ML syrup, Take 5 mLs by mouth every 6 (six) hours as needed., Disp: , Rfl:    riTUXimab  (RITUXAN  IV), Inject into the vein every 21 (  twenty-one) days., Disp: , Rfl:    VINCRISTINE  SULFATE IV, Inject into the vein every 21 ( twenty-one) days., Disp: , Rfl:  No current facility-administered medications for this visit.  Facility-Administered Medications Ordered in Other Visits:    0.9 %  sodium chloride  infusion, , Intravenous, Continuous, Paulett Boros, MD, Stopped at 03/29/23 1333   0.9 %  sodium chloride  infusion, , Intravenous, Continuous, Kenlynn Houde, MD   acetaminophen  (TYLENOL ) tablet 650 mg, 650 mg, Oral, Once, Paulett Boros, MD   cetirizine  (QUZYTTIR ) injection 10 mg, 10 mg, Intravenous, Once, Paulett Boros, MD   cyclophosphamide  (CYTOXAN ) 600 mg in sodium chloride  0.9 % 250 mL chemo infusion, 400 mg/m2 (Treatment Plan Recorded), Intravenous, Once, Asante Ritacco, MD   dexamethasone  (DECADRON ) injection 10 mg, 10 mg, Intravenous, Once, Felicia Both, MD   DOXOrubicin  (ADRIAMYCIN ) chemo injection 38 mg, 25 mg/m2 (Treatment Plan Recorded), Intravenous, Once, Paulett Boros, MD   famotidine  (PEPCID ) IVPB 20 mg premix, 20 mg, Intravenous, Once, Paulett Boros, MD   fosaprepitant  (EMEND) 150 mg in sodium chloride  0.9 % 145 mL IVPB, 150 mg, Intravenous, Once, Paulett Boros, MD   heparin  lock flush 100 unit/mL, 500 Units, Intracatheter, Once PRN, Jakyria Bleau, MD   palonosetron  (ALOXI ) injection 0.25 mg, 0.25 mg, Intravenous, Once, Paulett Boros, MD   riTUXimab -pvvr (RUXIENCE ) 600 mg in sodium chloride  0.9 % 250 mL (1.9355 mg/mL) infusion, 375 mg/m2 (Treatment Plan Recorded), Intravenous, Once, Paulett Boros, MD   sodium chloride  flush (NS) 0.9 % injection 10 mL, 10 mL, Intracatheter, PRN, Nahum Sherrer, MD, 10 mL at 03/29/23 1336   sodium chloride  flush (NS) 0.9 % injection 10 mL, 10 mL, Intracatheter, PRN, Birt Reinoso, MD   vinCRIStine  (ONCOVIN ) 1 mg in sodium chloride  0.9 % 50 mL chemo infusion, 1 mg, Intravenous, Once,  Paulett Boros, MD   Allergies: Allergies  Allergen Reactions   Penicillins Rash    REVIEW OF SYSTEMS:   Review of Systems  Constitutional:  Negative for chills, fatigue and fever.  HENT:   Negative for lump/mass, mouth sores, nosebleeds, sore throat and trouble swallowing.   Eyes:  Negative for eye problems.  Respiratory:  Negative for cough and shortness of breath.   Cardiovascular:  Negative for chest pain, leg swelling and palpitations.  Gastrointestinal:  Negative for abdominal pain, constipation, diarrhea, nausea and vomiting.  Genitourinary:  Negative for bladder incontinence, difficulty urinating, dysuria, frequency, hematuria and nocturia.   Musculoskeletal:  Negative for arthralgias, back pain, flank pain, myalgias and neck pain.  Skin:  Negative for itching and rash.  Neurological:  Positive for numbness. Negative for dizziness and headaches.  Hematological:  Does not bruise/bleed easily.  Psychiatric/Behavioral:  Positive for sleep disturbance. Negative for depression and suicidal ideas. The patient is not nervous/anxious.   All other systems reviewed and are negative.    VITALS:   Blood pressure 128/61, pulse 83, temperature (!) 97.2 F (36.2 C), temperature source Tympanic, resp. rate  18, height 5' (1.524 m), weight 125 lb (56.7 kg), SpO2 100%.  Wt Readings from Last 3 Encounters:  07/20/23 125 lb (56.7 kg)  06/29/23 126 lb 12.2 oz (57.5 kg)  06/08/23 128 lb (58.1 kg)    Body mass index is 24.41 kg/m.  Performance status (ECOG): 1 - Symptomatic but completely ambulatory  PHYSICAL EXAM:   Physical Exam Vitals and nursing note reviewed. Exam conducted with a chaperone present.  Constitutional:      Appearance: Normal appearance.  Cardiovascular:     Rate and Rhythm: Normal rate and regular rhythm.     Pulses: Normal pulses.     Heart sounds: Normal heart sounds.  Pulmonary:     Effort: Pulmonary effort is normal.     Breath sounds: Normal breath  sounds.  Abdominal:     Palpations: Abdomen is soft. There is no hepatomegaly, splenomegaly or mass.     Tenderness: There is no abdominal tenderness.  Musculoskeletal:     Right lower leg: Edema present.     Left lower leg: Edema present.  Lymphadenopathy:     Cervical: No cervical adenopathy.     Right cervical: No superficial, deep or posterior cervical adenopathy.    Left cervical: No superficial, deep or posterior cervical adenopathy.     Upper Body:     Right upper body: No supraclavicular or axillary adenopathy.     Left upper body: No supraclavicular or axillary adenopathy.  Neurological:     General: No focal deficit present.     Mental Status: She is alert and oriented to person, place, and time.  Psychiatric:        Mood and Affect: Mood normal.        Behavior: Behavior normal.     LABS:   CBC     Component Value Date/Time   WBC 7.2 07/20/2023 0835   RBC 2.93 (L) 07/20/2023 0835   HGB 8.7 (L) 07/20/2023 0835   HCT 27.1 (L) 07/20/2023 0835   PLT 627 (H) 07/20/2023 0835   MCV 92.5 07/20/2023 0835   MCH 29.7 07/20/2023 0835   MCHC 32.1 07/20/2023 0835   RDW 17.5 (H) 07/20/2023 0835   LYMPHSABS 1.4 07/20/2023 0835   MONOABS 1.1 (H) 07/20/2023 0835   EOSABS 0.0 07/20/2023 0835   BASOSABS 0.0 07/20/2023 0835    CMP      Component Value Date/Time   NA 141 07/20/2023 0835   K 3.2 (L) 07/20/2023 0835   CL 108 07/20/2023 0835   CO2 25 07/20/2023 0835   GLUCOSE 134 (H) 07/20/2023 0835   BUN 19 07/20/2023 0835   CREATININE 0.87 07/20/2023 0835   CALCIUM  9.0 07/20/2023 0835   PROT 5.2 (L) 07/20/2023 0835   ALBUMIN 2.9 (L) 07/20/2023 0835   AST 15 07/20/2023 0835   ALT 9 07/20/2023 0835   ALKPHOS 64 07/20/2023 0835   BILITOT 0.4 07/20/2023 0835   GFRNONAA >60 07/20/2023 0835   GFRAA 43 (L) 08/01/2019 1326     No results found for: "CEA1", "CEA" / No results found for: "CEA1", "CEA" No results found for: "PSA1" No results found for: "ZOX096" No  results found for: "CAN125"  Lab Results  Component Value Date   TOTALPROTELP 4.3 (L) 06/08/2023   ALBUMINELP 2.8 (L) 06/08/2023   A1GS 0.2 06/08/2023   A2GS 0.4 06/08/2023   BETS 0.6 (L) 06/08/2023   GAMS 0.3 (L) 06/08/2023   MSPIKE 0.1 (H) 06/08/2023   SPEI Comment 06/08/2023   Lab  Results  Component Value Date   TIBC 358 04/19/2023   TIBC 364 03/16/2023   TIBC 375 09/15/2022   FERRITIN 10 (L) 04/19/2023   FERRITIN 6 (L) 03/16/2023   FERRITIN 7 (L) 09/15/2022   IRONPCTSAT 4 (L) 04/19/2023   IRONPCTSAT 12 03/16/2023   IRONPCTSAT 5 (L) 09/15/2022   Lab Results  Component Value Date   LDH 122 03/02/2023   LDH 154 09/30/2021   LDH 148 08/05/2021     STUDIES:   No results found.

## 2023-07-20 ENCOUNTER — Inpatient Hospital Stay

## 2023-07-20 ENCOUNTER — Inpatient Hospital Stay: Admitting: Hematology

## 2023-07-20 VITALS — BP 108/51 | HR 83 | Temp 98.1°F | Resp 18

## 2023-07-20 VITALS — BP 128/61 | HR 83 | Temp 97.2°F | Resp 18 | Ht 60.0 in | Wt 125.0 lb

## 2023-07-20 DIAGNOSIS — I739 Peripheral vascular disease, unspecified: Secondary | ICD-10-CM | POA: Diagnosis not present

## 2023-07-20 DIAGNOSIS — D75839 Thrombocytosis, unspecified: Secondary | ICD-10-CM | POA: Diagnosis not present

## 2023-07-20 DIAGNOSIS — E876 Hypokalemia: Secondary | ICD-10-CM

## 2023-07-20 DIAGNOSIS — N189 Chronic kidney disease, unspecified: Secondary | ICD-10-CM | POA: Diagnosis not present

## 2023-07-20 DIAGNOSIS — C9000 Multiple myeloma not having achieved remission: Secondary | ICD-10-CM

## 2023-07-20 DIAGNOSIS — Z5189 Encounter for other specified aftercare: Secondary | ICD-10-CM | POA: Diagnosis not present

## 2023-07-20 DIAGNOSIS — D509 Iron deficiency anemia, unspecified: Secondary | ICD-10-CM | POA: Diagnosis not present

## 2023-07-20 DIAGNOSIS — C8335 Diffuse large B-cell lymphoma, lymph nodes of inguinal region and lower limb: Secondary | ICD-10-CM

## 2023-07-20 DIAGNOSIS — I2699 Other pulmonary embolism without acute cor pulmonale: Secondary | ICD-10-CM | POA: Diagnosis not present

## 2023-07-20 DIAGNOSIS — Z5111 Encounter for antineoplastic chemotherapy: Secondary | ICD-10-CM | POA: Diagnosis not present

## 2023-07-20 DIAGNOSIS — Z79899 Other long term (current) drug therapy: Secondary | ICD-10-CM | POA: Diagnosis not present

## 2023-07-20 DIAGNOSIS — Z9081 Acquired absence of spleen: Secondary | ICD-10-CM | POA: Diagnosis not present

## 2023-07-20 DIAGNOSIS — E7800 Pure hypercholesterolemia, unspecified: Secondary | ICD-10-CM | POA: Diagnosis not present

## 2023-07-20 DIAGNOSIS — Z7961 Long term (current) use of immunomodulator: Secondary | ICD-10-CM | POA: Diagnosis not present

## 2023-07-20 DIAGNOSIS — M7989 Other specified soft tissue disorders: Secondary | ICD-10-CM | POA: Diagnosis not present

## 2023-07-20 DIAGNOSIS — Z7952 Long term (current) use of systemic steroids: Secondary | ICD-10-CM | POA: Diagnosis not present

## 2023-07-20 DIAGNOSIS — I130 Hypertensive heart and chronic kidney disease with heart failure and stage 1 through stage 4 chronic kidney disease, or unspecified chronic kidney disease: Secondary | ICD-10-CM | POA: Diagnosis not present

## 2023-07-20 DIAGNOSIS — E669 Obesity, unspecified: Secondary | ICD-10-CM | POA: Diagnosis not present

## 2023-07-20 LAB — CBC WITH DIFFERENTIAL/PLATELET
Abs Immature Granulocytes: 0.02 10*3/uL (ref 0.00–0.07)
Basophils Absolute: 0 10*3/uL (ref 0.0–0.1)
Basophils Relative: 0 %
Eosinophils Absolute: 0 10*3/uL (ref 0.0–0.5)
Eosinophils Relative: 0 %
HCT: 27.1 % — ABNORMAL LOW (ref 36.0–46.0)
Hemoglobin: 8.7 g/dL — ABNORMAL LOW (ref 12.0–15.0)
Immature Granulocytes: 0 %
Lymphocytes Relative: 19 %
Lymphs Abs: 1.4 10*3/uL (ref 0.7–4.0)
MCH: 29.7 pg (ref 26.0–34.0)
MCHC: 32.1 g/dL (ref 30.0–36.0)
MCV: 92.5 fL (ref 80.0–100.0)
Monocytes Absolute: 1.1 10*3/uL — ABNORMAL HIGH (ref 0.1–1.0)
Monocytes Relative: 15 %
Neutro Abs: 4.7 10*3/uL (ref 1.7–7.7)
Neutrophils Relative %: 66 %
Platelets: 627 10*3/uL — ABNORMAL HIGH (ref 150–400)
RBC: 2.93 MIL/uL — ABNORMAL LOW (ref 3.87–5.11)
RDW: 17.5 % — ABNORMAL HIGH (ref 11.5–15.5)
WBC: 7.2 10*3/uL (ref 4.0–10.5)
nRBC: 0.3 % — ABNORMAL HIGH (ref 0.0–0.2)

## 2023-07-20 LAB — COMPREHENSIVE METABOLIC PANEL WITH GFR
ALT: 9 U/L (ref 0–44)
AST: 15 U/L (ref 15–41)
Albumin: 2.9 g/dL — ABNORMAL LOW (ref 3.5–5.0)
Alkaline Phosphatase: 64 U/L (ref 38–126)
Anion gap: 8 (ref 5–15)
BUN: 19 mg/dL (ref 8–23)
CO2: 25 mmol/L (ref 22–32)
Calcium: 9 mg/dL (ref 8.9–10.3)
Chloride: 108 mmol/L (ref 98–111)
Creatinine, Ser: 0.87 mg/dL (ref 0.44–1.00)
GFR, Estimated: >60 mL/min (ref >60)
Glucose, Bld: 134 mg/dL — ABNORMAL HIGH (ref 70–99)
Potassium: 3.2 mmol/L — ABNORMAL LOW (ref 3.5–5.1)
Sodium: 141 mmol/L (ref 135–145)
Total Bilirubin: 0.4 mg/dL (ref 0.0–1.2)
Total Protein: 5.2 g/dL — ABNORMAL LOW (ref 6.5–8.1)

## 2023-07-20 LAB — MAGNESIUM: Magnesium: 1.9 mg/dL (ref 1.7–2.4)

## 2023-07-20 MED ORDER — DOXORUBICIN HCL CHEMO IV INJECTION 2 MG/ML
25.0000 mg/m2 | Freq: Once | INTRAVENOUS | Status: AC
  Administered 2023-07-20: 38 mg via INTRAVENOUS
  Filled 2023-07-20: qty 19

## 2023-07-20 MED ORDER — SODIUM CHLORIDE 0.9 % IV SOLN: INTRAVENOUS | Status: DC

## 2023-07-20 MED ORDER — SODIUM CHLORIDE 0.9 % IV SOLN
375.0000 mg/m2 | Freq: Once | INTRAVENOUS | Status: AC
  Administered 2023-07-20: 600 mg via INTRAVENOUS
  Filled 2023-07-20: qty 50

## 2023-07-20 MED ORDER — PALONOSETRON HCL INJECTION 0.25 MG/5ML
0.2500 mg | Freq: Once | INTRAVENOUS | Status: AC
  Administered 2023-07-20: 0.25 mg via INTRAVENOUS
  Filled 2023-07-20: qty 5

## 2023-07-20 MED ORDER — SODIUM CHLORIDE 0.9% FLUSH: 10.0000 mL | INTRAVENOUS | Status: DC | PRN

## 2023-07-20 MED ORDER — ACETAMINOPHEN 325 MG PO TABS
650.0000 mg | ORAL_TABLET | Freq: Once | ORAL | Status: AC
  Administered 2023-07-20: 650 mg via ORAL
  Filled 2023-07-20: qty 2

## 2023-07-20 MED ORDER — POTASSIUM CHLORIDE CRYS ER 20 MEQ PO TBCR
40.0000 meq | EXTENDED_RELEASE_TABLET | Freq: Once | ORAL | Status: AC
  Administered 2023-07-20: 40 meq via ORAL
  Filled 2023-07-20: qty 2

## 2023-07-20 MED ORDER — CETIRIZINE HCL 10 MG/ML IV SOLN
10.0000 mg | Freq: Once | INTRAVENOUS | Status: AC
  Administered 2023-07-20: 10 mg via INTRAVENOUS
  Filled 2023-07-20: qty 1

## 2023-07-20 MED ORDER — HEPARIN SOD (PORK) LOCK FLUSH 100 UNIT/ML IV SOLN
500.0000 [IU] | Freq: Once | INTRAVENOUS | Status: AC | PRN
  Administered 2023-07-20: 500 [IU]

## 2023-07-20 MED ORDER — SODIUM CHLORIDE 0.9 % IV SOLN
400.0000 mg/m2 | Freq: Once | INTRAVENOUS | Status: AC
  Administered 2023-07-20: 600 mg via INTRAVENOUS
  Filled 2023-07-20: qty 30

## 2023-07-20 MED ORDER — SODIUM CHLORIDE 0.9 % IV SOLN
150.0000 mg | Freq: Once | INTRAVENOUS | Status: AC
  Administered 2023-07-20: 150 mg via INTRAVENOUS
  Filled 2023-07-20: qty 150

## 2023-07-20 MED ORDER — FAMOTIDINE IN NACL 20-0.9 MG/50ML-% IV SOLN
20.0000 mg | Freq: Once | INTRAVENOUS | Status: AC
  Administered 2023-07-20: 20 mg via INTRAVENOUS
  Filled 2023-07-20: qty 50

## 2023-07-20 MED ORDER — DEXAMETHASONE SODIUM PHOSPHATE 10 MG/ML IJ SOLN
10.0000 mg | Freq: Once | INTRAMUSCULAR | Status: AC
  Administered 2023-07-20: 10 mg via INTRAVENOUS
  Filled 2023-07-20: qty 1

## 2023-07-20 MED ORDER — SODIUM CHLORIDE 0.9% FLUSH
10.0000 mL | INTRAVENOUS | Status: DC | PRN
  Administered 2023-07-20: 10 mL via INTRAVENOUS

## 2023-07-20 MED ORDER — VINCRISTINE SULFATE CHEMO INJECTION 1 MG/ML
1.0000 mg | Freq: Once | INTRAVENOUS | Status: AC
  Administered 2023-07-20: 1 mg via INTRAVENOUS
  Filled 2023-07-20: qty 1

## 2023-07-20 NOTE — Patient Instructions (Addendum)
 [facility] - A DEPT OF [facility]  Discharge Instructions: Thank you for choosing [facility] to provide your oncology and hematology care.  If you have a lab appointment with the Cancer Center - please note that after April 8th, 2024, all labs will be drawn in the cancer center.  You do not have to check in or register with the main entrance as you have in the past but will complete your check-in in the cancer center.  Wear comfortable clothing and clothing appropriate for easy access to any Portacath or PICC line.   We strive to give you quality time with your provider. You may need to reschedule your appointment if you arrive late (15 or more minutes).  Arriving late affects you and other patients whose appointments are after yours.  Also, if you miss three or more appointments without notifying the office, you may be dismissed from the clinic at the provider's discretion.      For prescription refill requests, have your pharmacy contact our office and allow 72 hours for refills to be completed.    Today you received the following chemotherapy and/or immunotherapy agents Adriamycin , Ruxience , Cytoxan , Vincristine      To help prevent nausea and vomiting after your treatment, we encourage you to take your nausea medication as directed.  BELOW ARE SYMPTOMS THAT SHOULD BE REPORTED IMMEDIATELY: *FEVER GREATER THAN 100.4 F (38 C) OR HIGHER *CHILLS OR SWEATING *NAUSEA AND VOMITING THAT IS NOT CONTROLLED WITH YOUR NAUSEA MEDICATION *UNUSUAL SHORTNESS OF BREATH *UNUSUAL BRUISING OR BLEEDING *URINARY PROBLEMS (pain or burning when urinating, or frequent urination) *BOWEL PROBLEMS (unusual diarrhea, constipation, pain near the anus) TENDERNESS IN MOUTH AND THROAT WITH OR WITHOUT PRESENCE OF ULCERS (sore throat, sores in mouth, or a toothache) UNUSUAL RASH, SWELLING OR PAIN  UNUSUAL VAGINAL DISCHARGE OR ITCHING   Items with * indicate a potential  emergency and should be followed up as soon as possible or go to the Emergency Department if any problems should occur.  Please show the CHEMOTHERAPY ALERT CARD or IMMUNOTHERAPY ALERT CARD at check-in to the Emergency Department and triage nurse.  Should you have questions after your visit or need to cancel or reschedule your appointment, please contact [facility] [place] - A DEPT OF [facility] [phone]  and follow the prompts.  Office hours are 8:00 a.m. to 4:30 p.m. Monday - Friday. Please note that voicemails left after 4:00 p.m. may not be returned until the following business day.  We are closed weekends and major holidays. You have access to a nurse at all times for urgent questions. Please call the main number to the clinic [phone] and follow the prompts.  For any non-urgent questions, you may also contact your provider using MyChart. We now offer e-Visits for anyone 25 and older to request care online for non-urgent symptoms. For details visit [email or web address].   Also download the MyChart app! Go to the app store, search "MyChart", open the app, select [place], and log in with your MyChart username and password.

## 2023-07-20 NOTE — Patient Instructions (Signed)

## 2023-07-20 NOTE — Progress Notes (Signed)
 Patient has been examined by Dr. Ellin Saba. Vital signs and labs have been reviewed by MD - ANC, Creatinine, LFTs, hemoglobin, and platelets are within treatment parameters per M.D. - pt may proceed with treatment.  Primary RN and pharmacy notified.

## 2023-07-20 NOTE — Progress Notes (Signed)
 Patient tolerated chemotherapy with no complaints voiced.  Side effects with management reviewed with understanding verbalized.  Port site clean and dry with no bruising or swelling noted at site.  Good blood return noted before and after administration of chemotherapy.  Band aid applied.  Patient left in satisfactory condition with VSS and no s/s of distress noted. All follow ups as scheduled.   Venkat Ankney [employer]

## 2023-07-20 NOTE — Progress Notes (Signed)
 Patient okay for treatment per Dr. Katragadda.

## 2023-07-22 ENCOUNTER — Inpatient Hospital Stay

## 2023-07-22 VITALS — BP 112/58 | HR 71 | Temp 97.5°F | Resp 18

## 2023-07-22 DIAGNOSIS — C9000 Multiple myeloma not having achieved remission: Secondary | ICD-10-CM

## 2023-07-22 DIAGNOSIS — Z7952 Long term (current) use of systemic steroids: Secondary | ICD-10-CM | POA: Diagnosis not present

## 2023-07-22 DIAGNOSIS — C8335 Diffuse large B-cell lymphoma, lymph nodes of inguinal region and lower limb: Secondary | ICD-10-CM | POA: Diagnosis not present

## 2023-07-22 DIAGNOSIS — Z5189 Encounter for other specified aftercare: Secondary | ICD-10-CM | POA: Diagnosis not present

## 2023-07-22 DIAGNOSIS — D75839 Thrombocytosis, unspecified: Secondary | ICD-10-CM | POA: Diagnosis not present

## 2023-07-22 DIAGNOSIS — Z7961 Long term (current) use of immunomodulator: Secondary | ICD-10-CM | POA: Diagnosis not present

## 2023-07-22 DIAGNOSIS — D509 Iron deficiency anemia, unspecified: Secondary | ICD-10-CM | POA: Diagnosis not present

## 2023-07-22 DIAGNOSIS — I739 Peripheral vascular disease, unspecified: Secondary | ICD-10-CM | POA: Diagnosis not present

## 2023-07-22 DIAGNOSIS — M7989 Other specified soft tissue disorders: Secondary | ICD-10-CM | POA: Diagnosis not present

## 2023-07-22 DIAGNOSIS — E876 Hypokalemia: Secondary | ICD-10-CM | POA: Diagnosis not present

## 2023-07-22 DIAGNOSIS — I130 Hypertensive heart and chronic kidney disease with heart failure and stage 1 through stage 4 chronic kidney disease, or unspecified chronic kidney disease: Secondary | ICD-10-CM | POA: Diagnosis not present

## 2023-07-22 DIAGNOSIS — I2699 Other pulmonary embolism without acute cor pulmonale: Secondary | ICD-10-CM | POA: Diagnosis not present

## 2023-07-22 DIAGNOSIS — Z5111 Encounter for antineoplastic chemotherapy: Secondary | ICD-10-CM | POA: Diagnosis not present

## 2023-07-22 DIAGNOSIS — Z9081 Acquired absence of spleen: Secondary | ICD-10-CM | POA: Diagnosis not present

## 2023-07-22 DIAGNOSIS — N189 Chronic kidney disease, unspecified: Secondary | ICD-10-CM | POA: Diagnosis not present

## 2023-07-22 DIAGNOSIS — Z79899 Other long term (current) drug therapy: Secondary | ICD-10-CM | POA: Diagnosis not present

## 2023-07-22 DIAGNOSIS — E7800 Pure hypercholesterolemia, unspecified: Secondary | ICD-10-CM | POA: Diagnosis not present

## 2023-07-22 DIAGNOSIS — E669 Obesity, unspecified: Secondary | ICD-10-CM | POA: Diagnosis not present

## 2023-07-22 MED ORDER — PEGFILGRASTIM-CBQV 6 MG/0.6ML ~~LOC~~ SOSY
6.0000 mg | PREFILLED_SYRINGE | Freq: Once | SUBCUTANEOUS | Status: AC
  Administered 2023-07-22: 6 mg via SUBCUTANEOUS
  Filled 2023-07-22: qty 0.6

## 2023-07-22 NOTE — Progress Notes (Signed)
Udenyca injection given per orders. Patient tolerated it well without problems. Vitals stable and discharged home from clinic via wheelchair.  Follow up as scheduled.

## 2023-07-22 NOTE — Patient Instructions (Signed)
 [facility] [place] - A DEPT OF [facility]  Discharge Instructions: Thank you for choosing [facility] to provide your oncology and hematology care.  If you have a lab appointment with the Cancer Center - please note that after April 8th, 2024, all labs will be drawn in the cancer center.  You do not have to check in or register with the main entrance as you have in the past but will complete your check-in in the cancer center.  Wear comfortable clothing and clothing appropriate for easy access to any Portacath or PICC line.   We strive to give you quality time with your provider. You may need to reschedule your appointment if you arrive late (15 or more minutes).  Arriving late affects you and other patients whose appointments are after yours.  Also, if you miss three or more appointments without notifying the office, you may be dismissed from the clinic at the provider's discretion.      For prescription refill requests, have your pharmacy contact our office and allow 72 hours for refills to be completed.    Today you received the following injection: udencya   To help prevent nausea and vomiting after your treatment, we encourage you to take your nausea medication as directed.  BELOW ARE SYMPTOMS THAT SHOULD BE REPORTED IMMEDIATELY: *FEVER GREATER THAN 100.4 F (38 C) OR HIGHER *CHILLS OR SWEATING *NAUSEA AND VOMITING THAT IS NOT CONTROLLED WITH YOUR NAUSEA MEDICATION *UNUSUAL SHORTNESS OF BREATH *UNUSUAL BRUISING OR BLEEDING *URINARY PROBLEMS (pain or burning when urinating, or frequent urination) *BOWEL PROBLEMS (unusual diarrhea, constipation, pain near the anus) TENDERNESS IN MOUTH AND THROAT WITH OR WITHOUT PRESENCE OF ULCERS (sore throat, sores in mouth, or a toothache) UNUSUAL RASH, SWELLING OR PAIN  UNUSUAL VAGINAL DISCHARGE OR ITCHING   Items with * indicate a potential emergency and should be followed up as soon as possible or go to the  Emergency Department if any problems should occur.  Please show the CHEMOTHERAPY ALERT CARD or IMMUNOTHERAPY ALERT CARD at check-in to the Emergency Department and triage nurse.  Should you have questions after your visit or need to cancel or reschedule your appointment, please contact [facility] [place] - A DEPT OF [facility] [phone]  and follow the prompts.  Office hours are 8:00 a.m. to 4:30 p.m. Monday - Friday. Please note that voicemails left after 4:00 p.m. may not be returned until the following business day.  We are closed weekends and major holidays. You have access to a nurse at all times for urgent questions. Please call the main number to the clinic [phone] and follow the prompts.  For any non-urgent questions, you may also contact your provider using MyChart. We now offer e-Visits for anyone 97 and older to request care online for non-urgent symptoms. For details visit [email or web address].   Also download the MyChart app! Go to the app store, search "MyChart", open the app, select [facility], and log in with your MyChart username and password.

## 2023-07-28 ENCOUNTER — Encounter: Payer: Self-pay | Admitting: Hematology

## 2023-08-05 DIAGNOSIS — Z1331 Encounter for screening for depression: Secondary | ICD-10-CM | POA: Diagnosis not present

## 2023-08-05 DIAGNOSIS — Z1389 Encounter for screening for other disorder: Secondary | ICD-10-CM | POA: Diagnosis not present

## 2023-08-05 DIAGNOSIS — R946 Abnormal results of thyroid function studies: Secondary | ICD-10-CM | POA: Diagnosis not present

## 2023-08-05 DIAGNOSIS — Z0001 Encounter for general adult medical examination with abnormal findings: Secondary | ICD-10-CM | POA: Diagnosis not present

## 2023-08-05 DIAGNOSIS — I5032 Chronic diastolic (congestive) heart failure: Secondary | ICD-10-CM | POA: Diagnosis not present

## 2023-08-05 DIAGNOSIS — I10 Essential (primary) hypertension: Secondary | ICD-10-CM | POA: Diagnosis not present

## 2023-08-05 DIAGNOSIS — Z86711 Personal history of pulmonary embolism: Secondary | ICD-10-CM | POA: Diagnosis not present

## 2023-08-11 ENCOUNTER — Encounter (HOSPITAL_COMMUNITY)
Admission: RE | Admit: 2023-08-11 | Discharge: 2023-08-11 | Disposition: A | Discharge status: Home / Self Care | Source: Ambulatory Visit | Attending: Hematology | Admitting: Hematology

## 2023-08-11 DIAGNOSIS — C8515 Unspecified B-cell lymphoma, lymph nodes of inguinal region and lower limb: Secondary | ICD-10-CM | POA: Diagnosis not present

## 2023-08-11 DIAGNOSIS — C8335 Diffuse large B-cell lymphoma, lymph nodes of inguinal region and lower limb: Secondary | ICD-10-CM | POA: Diagnosis not present

## 2023-08-11 MED ORDER — FLUDEOXYGLUCOSE F - 18 (FDG) INJECTION
6.6000 | Freq: Once | INTRAVENOUS | Status: AC | PRN
  Administered 2023-08-11: 6.6 via INTRAVENOUS

## 2023-08-11 MED ORDER — HEPARIN SOD (PORK) LOCK FLUSH 100 UNIT/ML IV SOLN
INTRAVENOUS | Status: AC
  Filled 2023-08-11: qty 5

## 2023-08-11 MED ORDER — HEPARIN SOD (PORK) LOCK FLUSH 100 UNIT/ML IV SOLN
500.0000 [IU] | Freq: Once | INTRAVENOUS | Status: AC
  Administered 2023-08-11: 500 [IU] via INTRAVENOUS

## 2023-08-13 NOTE — Progress Notes (Deleted)
 Cardiology Office Note   Date:  08/13/2023   ID:  Leslie, Williamson 26-Dec-1931, MRN [id number]  PCP:  Carlette Benita Area, MD  Cardiologist:   Vina Gull, MD   Pt presents for follow up after hosp for pulmonary embolus   History of Present Illness: Leslie Williamson is a 89 y.o. female with a history of HTN, HL, HFmrEF She was seen in consult in April when se was admitted with LE edema   Echo on 06/01/22 showed LVEF 45 to 50%  RV function normal.  Severe LAE   Mild MR   New compared to echo in 2017 when LVEF normal    She was diuresed and d/c'd on lasix , spironolactone , toprol  XL, losartan  and farxiga  The pt was admitted on 07/02/22 with L sided CP, SOB and weaknes  Started about 3 days prior to admit   Intermitt but worsening  Carediology consulted for troponin of 1072   Heparin  started    Echo done    Showed new RV dysfunction    With this pt set up for CTA of chest which showed  severe bilateral occlusive PE   LE dopplers were negative for DVT   The pt was discharged on spironolactone , farxiga .  She also has lasix  to take as needed for weight gain  The pt says she is feeling a little stronger, Denies signficant SOB   No dizziness.  No chest pressure   No palpitations   I saw the pt in May 2024    No outpatient medications have been marked as taking for the 08/19/23 encounter (Appointment) with Gull Vina GAILS, MD.     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   Vertigo, labyrinthine     Past Surgical History:  Procedure Laterality Date   CATARACT EXTRACTION W/PHACO Left 11/03/2015   Procedure: CATARACT EXTRACTION PHACO AND INTRAOCULAR LENS PLACEMENT (IOC);  Surgeon: Cherene Mania, MD;  Location: [facility];  Service: Ophthalmology;  Laterality: Left;  CDE: 7.74   CATARACT EXTRACTION W/PHACO Right 11/20/2015   Procedure: CATARACT EXTRACTION PHACO AND INTRAOCULAR LENS PLACEMENT ; CDE:  8.30;  Surgeon: Cherene Mania, MD;  Location: [facility];  Service: Ophthalmology;  Laterality: Right;   IR IMAGING GUIDED PORT INSERTION  11/04/2021   MASS EXCISION Right 02/18/2023   Procedure: Incisional Biopsy Right Lower Extremity;  Surgeon: Kallie Manuelita BROCKS, MD;  Location: [facility];  Service: General;  Laterality: Right;   PORT-A-CATH REMOVAL       Social History:  The patient  reports that she has quit smoking. She has never used smokeless tobacco. She reports that she does not drink alcohol  and does not use drugs.   Family History:  The patient's family history includes Diabetes in her mother.  ROS:  Please see the history of present illness. All other systems are reviewed and  Negative to the above problem except as noted.    PHYSICAL EXAM: VS:  There were no vitals taken for this visit.  GEN: Well nourished, well developed, in no acute distress  HEENT: normal  Neck: no JVD, carotid bruits Cardiac: RRR; no murmurs  Tr LE edema  Respiratory:  clear to auscultation bilaterally GI: soft, nontender, nondistended, + BS  No hepatomegaly     EKG:  EKG is ordered today. Echo 07/02/22 1. Compared to echo from April 2024, RV is dilated and function reduced.   2. Left ventricular ejection fraction, by estimation, is 50%. The left  ventricle has mildly decreased function. There is mild left ventricular  hypertrophy.   3. Right ventricular systolic function is  moderately reduced. The right  ventricular size is moderately enlarged.   4. Right atrial size was mildly dilated.   5. Trivial mitral valve regurgitation.   6. Tricuspid valve regurgitation is moderate to severe.   7. The aortic valve is tricuspid. Aortic valve regurgitation is mild.   8. The inferior vena cava is dilated in size with <50% respiratory  variability, suggesting right atrial pressure of 15 mmHg.    Echocardiogram 06/01/2022:  1. Left ventricular ejection fraction, by estimation, is 45 to 50%. The  left ventricle has mildly decreased function. The left ventricle  demonstrates global hypokinesis. There is mild left ventricular  hypertrophy. Left ventricular diastolic parameters  are consistent with Grade I diastolic dysfunction (impaired relaxation).  Elevated left atrial pressure.   2. Right ventricular systolic function is normal. The right ventricular  size is normal. There is normal pulmonary artery systolic pressure.   3. Left atrial size was severely dilated.   4. The mitral valve is abnormal. Mild mitral valve regurgitation. No  evidence of mitral stenosis.   5. The tricuspid valve is abnormal.   6. The aortic valve is tricuspid. Aortic valve regurgitation is mild. No  aortic stenosis is present.   7. The inferior vena cava is normal in size with greater than 50%  respiratory variability, suggesting right atrial pressure of 3 mmHg.     Lipid Panel    Component Value Date/Time   CHOL 138 08/18/2022 1412   TRIG 69 08/18/2022 1412   HDL 42 08/18/2022 1412   CHOLHDL 3.3 08/18/2022 1412   VLDL 14 08/18/2022 1412   LDLCALC 82 08/18/2022 1412      Wt Readings from Last 3 Encounters:  07/20/23 125 lb (56.7 kg)  06/29/23 126 lb 12.2 oz (57.5 kg)  06/08/23 128 lb (58.1 kg)      ASSESSMENT AND PLAN:  1  PE with RV strain (new)  PE severe      Pt was initially dizzy when in hospital  No syncope   Improved since then    Plan for indefinite anticoagulation give  multiple myeloma Plan for follow up echo late summer or fall  2 Trop elevation   Pt with elevated (flat ) troponin  1072, 1030, 1040, 1086   Most likely due to strain on RV from PE   Follow    3 HFmrEF   Echo with very mildly decreased LVEF   Keep on current meds  SBP is 100/  I would not push further given this and needs preload with PE hx    may need to back off some if dizzy  4.  Lipids   LDL 68  HDL 35   Trig 78   Follow     Current medicines are reviewed at length with the patient today.  The patient does not have concerns regarding medicines.  Signed, Vina Gull, MD  08/13/2023 10:59 PM    [facility] [address] [place], [place], [place]  [address] Phone: [phone]; Fax: [phone]

## 2023-08-18 ENCOUNTER — Inpatient Hospital Stay

## 2023-08-18 ENCOUNTER — Inpatient Hospital Stay: Attending: Hematology | Admitting: Hematology

## 2023-08-18 VITALS — BP 102/56 | HR 88 | Temp 96.8°F | Resp 18 | Wt 123.4 lb

## 2023-08-18 DIAGNOSIS — D509 Iron deficiency anemia, unspecified: Secondary | ICD-10-CM | POA: Diagnosis not present

## 2023-08-18 DIAGNOSIS — D539 Nutritional anemia, unspecified: Secondary | ICD-10-CM

## 2023-08-18 DIAGNOSIS — I2699 Other pulmonary embolism without acute cor pulmonale: Secondary | ICD-10-CM | POA: Diagnosis not present

## 2023-08-18 DIAGNOSIS — C9000 Multiple myeloma not having achieved remission: Secondary | ICD-10-CM

## 2023-08-18 DIAGNOSIS — Z9081 Acquired absence of spleen: Secondary | ICD-10-CM | POA: Insufficient documentation

## 2023-08-18 DIAGNOSIS — I2510 Atherosclerotic heart disease of native coronary artery without angina pectoris: Secondary | ICD-10-CM | POA: Diagnosis not present

## 2023-08-18 DIAGNOSIS — E876 Hypokalemia: Secondary | ICD-10-CM | POA: Diagnosis not present

## 2023-08-18 DIAGNOSIS — Z7901 Long term (current) use of anticoagulants: Secondary | ICD-10-CM | POA: Diagnosis not present

## 2023-08-18 DIAGNOSIS — N189 Chronic kidney disease, unspecified: Secondary | ICD-10-CM | POA: Diagnosis not present

## 2023-08-18 DIAGNOSIS — I739 Peripheral vascular disease, unspecified: Secondary | ICD-10-CM | POA: Diagnosis not present

## 2023-08-18 DIAGNOSIS — C8335 Diffuse large B-cell lymphoma, lymph nodes of inguinal region and lower limb: Secondary | ICD-10-CM | POA: Diagnosis not present

## 2023-08-18 DIAGNOSIS — I130 Hypertensive heart and chronic kidney disease with heart failure and stage 1 through stage 4 chronic kidney disease, or unspecified chronic kidney disease: Secondary | ICD-10-CM | POA: Diagnosis not present

## 2023-08-18 DIAGNOSIS — D631 Anemia in chronic kidney disease: Secondary | ICD-10-CM | POA: Insufficient documentation

## 2023-08-18 DIAGNOSIS — I700 Atherosclerosis of aorta: Secondary | ICD-10-CM | POA: Insufficient documentation

## 2023-08-18 DIAGNOSIS — D649 Anemia, unspecified: Secondary | ICD-10-CM | POA: Diagnosis not present

## 2023-08-18 DIAGNOSIS — Z7961 Long term (current) use of immunomodulator: Secondary | ICD-10-CM | POA: Diagnosis not present

## 2023-08-18 DIAGNOSIS — M79661 Pain in right lower leg: Secondary | ICD-10-CM | POA: Insufficient documentation

## 2023-08-18 DIAGNOSIS — D75839 Thrombocytosis, unspecified: Secondary | ICD-10-CM | POA: Diagnosis not present

## 2023-08-18 DIAGNOSIS — Z95828 Presence of other vascular implants and grafts: Secondary | ICD-10-CM

## 2023-08-18 DIAGNOSIS — E7800 Pure hypercholesterolemia, unspecified: Secondary | ICD-10-CM | POA: Insufficient documentation

## 2023-08-18 LAB — CBC WITH DIFFERENTIAL/PLATELET
Abs Immature Granulocytes: 0.03 10*3/uL (ref 0.00–0.07)
Basophils Absolute: 0 10*3/uL (ref 0.0–0.1)
Basophils Relative: 0 %
Eosinophils Absolute: 0.1 10*3/uL (ref 0.0–0.5)
Eosinophils Relative: 1 %
HCT: 25.6 % — ABNORMAL LOW (ref 36.0–46.0)
Hemoglobin: 7.8 g/dL — ABNORMAL LOW (ref 12.0–15.0)
Immature Granulocytes: 1 %
Lymphocytes Relative: 22 %
Lymphs Abs: 1.4 10*3/uL (ref 0.7–4.0)
MCH: 24.6 pg — ABNORMAL LOW (ref 26.0–34.0)
MCHC: 30.5 g/dL (ref 30.0–36.0)
MCV: 80.8 fL (ref 80.0–100.0)
Monocytes Absolute: 1.3 10*3/uL — ABNORMAL HIGH (ref 0.1–1.0)
Monocytes Relative: 21 %
Neutro Abs: 3.5 10*3/uL (ref 1.7–7.7)
Neutrophils Relative %: 55 %
Platelets: 573 10*3/uL — ABNORMAL HIGH (ref 150–400)
RBC: 3.17 MIL/uL — ABNORMAL LOW (ref 3.87–5.11)
RDW: 17.3 % — ABNORMAL HIGH (ref 11.5–15.5)
WBC: 6.2 10*3/uL (ref 4.0–10.5)
nRBC: 0.3 % — ABNORMAL HIGH (ref 0.0–0.2)

## 2023-08-18 LAB — COMPREHENSIVE METABOLIC PANEL WITH GFR
ALT: 8 U/L (ref 0–44)
AST: 13 U/L — ABNORMAL LOW (ref 15–41)
Albumin: 3.1 g/dL — ABNORMAL LOW (ref 3.5–5.0)
Alkaline Phosphatase: 71 U/L (ref 38–126)
Anion gap: 7 (ref 5–15)
BUN: 20 mg/dL (ref 8–23)
CO2: 25 mmol/L (ref 22–32)
Calcium: 8.6 mg/dL — ABNORMAL LOW (ref 8.9–10.3)
Chloride: 110 mmol/L (ref 98–111)
Creatinine, Ser: 1.14 mg/dL — ABNORMAL HIGH (ref 0.44–1.00)
GFR, Estimated: 46 mL/min — ABNORMAL LOW (ref >60)
Glucose, Bld: 108 mg/dL — ABNORMAL HIGH (ref 70–99)
Potassium: 3.6 mmol/L (ref 3.5–5.1)
Sodium: 142 mmol/L (ref 135–145)
Total Bilirubin: 0.7 mg/dL (ref 0.0–1.2)
Total Protein: 5.4 g/dL — ABNORMAL LOW (ref 6.5–8.1)

## 2023-08-18 LAB — SAMPLE TO BLOOD BANK

## 2023-08-18 LAB — TYPE AND SCREEN: ABO/RH(D): O POS

## 2023-08-18 LAB — PREPARE RBC (CROSSMATCH)

## 2023-08-18 LAB — MAGNESIUM: Magnesium: 2 mg/dL (ref 1.7–2.4)

## 2023-08-18 MED ORDER — HEPARIN SOD (PORK) LOCK FLUSH 100 UNIT/ML IV SOLN
500.0000 [IU] | Freq: Once | INTRAVENOUS | Status: AC
  Administered 2023-08-18: 500 [IU] via INTRAVENOUS

## 2023-08-18 MED ORDER — SODIUM CHLORIDE 0.9% FLUSH
10.0000 mL | INTRAVENOUS | Status: DC | PRN
  Administered 2023-08-18: 10 mL via INTRAVENOUS

## 2023-08-18 NOTE — Patient Instructions (Addendum)
 [facility] at [facility] Discharge Instructions   You were seen and examined today by Dr. Rogers.  He reviewed the results of your lab work which are normal/stable.   He reviewed the results of your PET scan which is showing lymphoma remaining in the leg. We will refer you to radiation oncology in [place] to have this area treated with radiation therapy. There is also some area lighting up in the stomach. We will refer you to a GI doctor to investigate this further.   We will see you back in 4 weeks.   Return as scheduled.    Thank you for choosing [facility] at [facility] to provide your oncology and hematology care.  To afford each patient quality time with our provider, please arrive at least 15 minutes before your scheduled appointment time.   If you have a lab appointment with the Cancer Center please come in thru the Main Entrance and check in at the main information desk.  You need to re-schedule your appointment should you arrive 10 or more minutes late.  We strive to give you quality time with our providers, and arriving late affects you and other patients whose appointments are after yours.  Also, if you no show three or more times for appointments you may be dismissed from the clinic at the providers discretion.     Again, thank you for choosing [facility].  Our hope is that these requests will decrease the amount of time that you wait before being seen by our physicians.       _____________________________________________________________  Should you have questions after your visit to [facility], please contact our office at [phone] and follow the prompts.  Our office hours are 8:00 a.m. and 4:30 p.m. Monday - Friday.  Please note that voicemails left after 4:00 p.m. may not be returned until the following business day.  We are closed weekends and major holidays.  You do have access to a nurse 24-7,  just call the main number to the clinic [phone] and do not press any options, hold on the line and a nurse will answer the phone.    For prescription refill requests, have your pharmacy contact our office and allow 72 hours.    Due to Covid, you will need to wear a mask upon entering the hospital. If you do not have a mask, a mask will be given to you at the Main Entrance upon arrival. For doctor visits, patients may have 1 support person age 29 or older with them. For treatment visits, patients can not have anyone with them due to social distancing guidelines and our immunocompromised population.

## 2023-08-18 NOTE — Progress Notes (Signed)
 [facility] [address], [place] [address]    Clinic Day:  08/18/2023  Referring physician: [name], [name] Demissie*  Patient Care Team: Carlette Benita Area, MD as PCP - General (Internal Medicine) Alvan Dorn FALCON, MD as PCP - Cardiology (Cardiology) Rogers Hai, MD as Medical Oncologist (Medical Oncology)   ASSESSMENT & PLAN:   Assessment: 1.  Biclonal IgG lambda and IgM kappa multiple myeloma, high risk: - BMBX (11/02/2021): IgG lambda plasma cells 70% - Myeloma FISH panel: Monosomy 13 (standard GIST), duplication of 1 q. (high risk), gain of 11 q. (standard risk). - Cytogenetics: Hyperdiploid E weight gain (trisomy/trisomy) of odd-numbered chromosomes and monosomy 13, standard risk.  Gain of 1 q. associated with progressive course. - PET scan (11/05/2021): No evidence of multiple myeloma.  No hypermetabolic adenopathy. - Daratumumab , Revlimid  and dexamethasone  cycle 1 started on 11/30/2021   2.  JAK2 positive essential thrombocytosis: - BMBX (11/02/2021): No evidence of fibrosis.  No morphological evidence of JAK2 positive ET.   3.  History of marginal zone lymphoma of the spleen: - Diagnosed in 1999, status post CHOP x7 cycles followed by rituximab  1 cycle. - Status post splenectomy.  No evidence of lymphoma on current PET scan.   4.  Social/family history: - She lives with her sister.  Son lives 30 minutes away.  She is independent of ADLs and some IADLs.  She drives.  She worked at [employer] and denies any exposure to chemicals or pesticides.  No family history of malignancies.   5.  Bone protection (DEXA scan 02/17/2022 T-score -0.9): - She saw dentist who recommended dental extractions.  She does not want zoledronic acid or denosumab.   6.  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 - Initial needle biopsy on  01/19/2023 showed malignant hematopoietic neoplasm. - Excision biopsy on 02/18/2023: Diffuse large B-cell lymphoma. - 6 cycles of R mini CHOP from 03/30/2023 through 07/20/2023    Plan: 1.  IgG lambda multiple myeloma: - Revlimid  and Darzalex  on hold while we are treating lymphoma. - Last multiple myeloma panel on 06/08/2018 with stable M spike of 0.1 g. - We have sent myeloma labs today.  Will review them in 1 month.   2.  Anemia from CKD and iron  deficiency: - Anemia from myelosuppression.  Last INFeD  was on 04/20/2023.  Hemoglobin today 7.8.  Will give 1 unit PRBC.   3.  Bilateral leg swellings: - Continue Lasix  20 mg daily in the morning as needed.   4.  Pulmonary embolism (Dx 07/03/2022):: - Continue Eliquis  twice daily indefinitely.  No bleeding issues.   5.  Hypokalemia: - Continue K-Dur 40 mill equivalents daily.  Potassium is normal.   6.  Diffuse large B-cell lymphoma of leg type, right leg: -She completed 6 cycles of R mini CHOP.  She has tolerated them reasonably well. - Reviewed PET scan from 08/11/2023: Similar size and mild increase in hypermetabolic involving soft tissue mass in the right lower extremity, Deauville 4.  New gastric fundal hypermetabolism with persistent wall thickening. - I have recommended radiation oncology consultation for ISRT. - I will request Dr. Shaaron for EGD due to new findings on the PET scan with gastric fundal hypermetabolism and persistent wall thickening. - We will reevaluate her in 4 weeks.    Orders Placed This Encounter  Procedures  Care order/instruction    Transfuse Parameters    Standing Status:   Future    Number of Occurrences:   1    Expiration Date:   08/17/2024   Type and screen         Standing Status:   Future    Number of Occurrences:   1    Expected Date:   08/18/2023    Expiration Date:   08/17/2024   Prepare RBC (crossmatch)    Standing Status:   Standing    Number of Occurrences:   1    # of Units:   1 unit     Transfusion Indications:   Hemoglobin 8 gm/dL or less and orthopedic or cardiac surgery or pre-existing cardiac condition    Number of Units to Keep Ahead:   NO units ahead    Instructions::   Transfuse    If emergent release call blood bank:   Not emergent release      I,Helena R Teague,acting as a scribe for Alean Stands, MD.,have documented all relevant documentation on the behalf of Alean Stands, MD,as directed by  Alean Stands, MD while in the presence of Alean Stands, MD.  I, Alean Stands MD, have reviewed the above documentation for accuracy and completeness, and I agree with the above.     Alean Stands, MD   6/26/202510:41 AM  CHIEF COMPLAINT:   Diagnosis: multiple myeloma, large B-cell lymphoma of leg type    Cancer Staging  Multiple myeloma (HCC) Staging form: Plasma Cell Myeloma and Plasma Cell Disorders, AJCC 8th Edition - Clinical stage from 11/16/2021: Albumin (g/dL): 3.1, ISS: Stage II, High-risk cytogenetics: Absent, LDH: Normal - Unsigned  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) Staging form: Primary Cutaneous B-Cell/T-Cell Lymphoma (Non-MF/SS Lymphoma), AJCC 8th Edition - Clinical stage from 03/01/2023: cT1a, cN0, cM0 - Unsigned    Prior Therapy: Daratumumab , Revlimid  and dexamethasone , 11/30/21 -  03/02/23, held for lymphoma treatment   Current Therapy:  R mini CHOP    HISTORY OF PRESENT ILLNESS:   Oncology History  NHL (non-Hodgkin's lymphoma) (HCC)  07/19/1995 Pathology Results   Spleen biopsy with resection- low-grade B-cell lymphoma, splenic marginal zone type   02/20/1998 Imaging   CT CAP-marked and extensive cervical adenopathy   02/25/1998 Pathology Results   Right cervical lymph node demonstrating large B-cell lymphoma   03/05/1998 Bone Marrow Biopsy   No evidence of bone marrow involvement   03/11/1998 - 07/18/1998 Chemotherapy   CHOP x 7 cycles   05/09/1998 Imaging    CT CAP and neck- slight interval decrease in size of para-aortic adenopathy with interval decrease in right inguinal and bi-iliac adenopathy.  Interval decrease in size and number of enlarged cervical nodes   07/10/1998 Imaging   Ct CAP and neck- unremarkable CT of chest. No enlarged retroperitoneal adenopathy withthe previously noted retroperitoneal nodes currently smaller to stable in size.  Cervical adenopathy is stable to slightly smaller in size.   08/18/1998 - 09/08/1998 Chemotherapy   Rituxan  Day 1, 8, 15, 22 x 1 cycle   01/08/1999 Remission   CT CAP and neck demonstrates no adenopathy or disease   01/08/1999 Imaging   CT CAP and neck- Stable CT of chest. Stable CT abd/pelvis without adenopathy.  Negative CT of neck   Multiple myeloma (HCC)  11/16/2021 Initial Diagnosis   Multiple myeloma (HCC)   11/30/2021 - 03/02/2023 Chemotherapy   Patient is on Treatment Plan : R-CHOP 03/29/23 - MYELOMA  Daratumumab   SQ + Lenalidomide  + Dexamethasone  (DaraRd) q28d     03/29/2023 -  Chemotherapy   Patient is on Treatment Plan : NON-HODGKINS LYMPHOMA R-CHOP q21d x 4 cycles        INTERVAL HISTORY:   Leslie Williamson is a 89 y.o. female presenting to clinic today for follow up of multiple myeloma, large B-cell lymphoma of leg type. She was last seen by me on 07/20/23.  Since her last visit, she underwent restaging PET on 08/11/23 that found: Similar size and mild increased hypermetabolism involving the soft tissue mass within the right lower extremity. (Deauville 4) New gastric fundal hypermetabolism with persistent wall thickening. This could represent gastritis or developing lymphoma. Cardiomegaly with new tiny bilateral pleural effusions. Question mild fluid overload given anasarca. Incidental findings, including: Coronary artery atherosclerosis. Aortic Atherosclerosis. Pulmonary artery enlargement suggests pulmonary arterial hypertension.  Today, she states that she is doing well overall. Her appetite level  is at 100%. Her energy level is at 100%. Batina is accompanied by her son.   She states her right calf pain has improved, and she has been applying rubbing alcohol  to the area. She denies any nausea, vomiting, SOB when lying down, dysphagia, acid reflux, or abdominal pain when eating. She does not take any acid reflux medication and denies any history of GERD.   She denies any lightheadedness when standing up. Khianna denies any weakness, other than if she skips a meal. She is not seen by GI and cannot remember if she has had a colonoscopy.   She is taking lasix  prn and Eliquis  as prescribed. Trisha denies any bleeding issues, including melena, BRBPR, or hematuria due to the blood thinner.   PAST MEDICAL HISTORY:   Past Medical History: 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   Vertigo, labyrinthine     Surgical History: Past Surgical History:  Procedure Laterality Date   CATARACT EXTRACTION W/PHACO Left 11/03/2015   Procedure: CATARACT EXTRACTION PHACO AND INTRAOCULAR LENS PLACEMENT (IOC);  Surgeon: Cherene Mania, MD;  Location: [facility];  Service: Ophthalmology;  Laterality: Left;  CDE: 7.74   CATARACT EXTRACTION W/PHACO Right 11/20/2015   Procedure: CATARACT EXTRACTION PHACO AND INTRAOCULAR LENS PLACEMENT ; CDE:  8.30;  Surgeon: Cherene Mania, MD;  Location: [facility];  Service: Ophthalmology;   Laterality: Right;   IR IMAGING GUIDED PORT INSERTION  11/04/2021   MASS EXCISION Right 02/18/2023   Procedure: Incisional Biopsy Right Lower Extremity;  Surgeon: Kallie Manuelita BROCKS, MD;  Location: [facility];  Service: General;  Laterality: Right;   PORT-A-CATH REMOVAL      Social History: Social History   Socioeconomic History   Marital status: Widowed    Spouse name: Not on file   Number of children: Not on file   Years of education: Not on file   Highest  education level: Not on file  Occupational History   Not on file  Tobacco Use   Smoking status: Former   Smokeless tobacco: Never  Vaping Use   Vaping status: Never Used  Substance and Sexual Activity   Alcohol  use: No   Drug use: No   Sexual activity: Not Currently  Other Topics Concern   Not on file  Social History Narrative   Not on file   Social Drivers of Health   Financial Resource Strain: Low Risk  (12/31/2019)   Overall Financial Resource Strain (CARDIA)    Difficulty of Paying Living Expenses: Not hard at all  Food Insecurity: No Food Insecurity (07/03/2022)   Hunger Vital Sign    Worried About Running Out of Food in the Last Year: Never true    Ran Out of Food in the Last Year: Never true  Transportation Needs: No Transportation Needs (07/03/2022)   PRAPARE - Administrator, Civil Service (Medical): No    Lack of Transportation (Non-Medical): No  Physical Activity: Inactive (12/31/2019)   Exercise Vital Sign    Days of Exercise per Week: 0 days    Minutes of Exercise per Session: 0 min  Stress: No Stress Concern Present (12/31/2019)   [employer] of Occupational Health - Occupational Stress Questionnaire    Feeling of Stress : Not at all  Social Connections: Moderately Isolated (12/31/2019)   Social Connection and Isolation Panel    Frequency of Communication with Friends and Family: More than three times a week    Frequency of Social Gatherings with Friends and Family: More than three times  a week    Attends Religious Services: More than 4 times per year    Active Member of [employer] or Organizations: No    Attends Banker Meetings: Never    Marital Status: Widowed  Intimate Partner Violence: Not At Risk (07/03/2022)   Humiliation, Afraid, Rape, and Kick questionnaire    Fear of Current or Ex-Partner: No    Emotionally Abused: No    Physically Abused: No    Sexually Abused: No    Family History: Family History  Problem Relation Age of Onset   Diabetes Mother     Current Medications:  Current Outpatient Medications:    acetaminophen  (TYLENOL ) 500 MG tablet, Take 1,000 mg by mouth every 6 (six) hours as needed for moderate pain., Disp: , Rfl:    acyclovir  (ZOVIRAX ) 400 MG tablet, Take 1 tablet (400 mg total) by mouth 2 (two) times daily., Disp: 60 tablet, Rfl: 6   allopurinol  (ZYLOPRIM ) 300 MG tablet, Take 1 tablet (300 mg total) by mouth daily., Disp: 30 tablet, Rfl: 0   apixaban  (ELIQUIS ) 5 MG TABS tablet, Take 1 tablet (5 mg total) by mouth 2 (two) times daily., Disp: 60 tablet, Rfl: 1   atorvastatin  (LIPITOR) 40 MG tablet, Take 40 mg by mouth at bedtime., Disp: , Rfl:    CYCLOPHOSPHAMIDE  IV, Inject into the vein every 21 ( twenty-one) days., Disp: , Rfl:    dapagliflozin  propanediol (FARXIGA ) 10 MG TABS tablet, Take 1 tablet (10 mg total) by mouth daily., Disp: 30 tablet, Rfl: 1   dexamethasone  (DECADRON ) 4 MG tablet, Take 10 mg (2.5 tablets) by mouth weekly every Wednesday morning Per note by Dr. Rogers on 09/15/2022, Disp: 20 tablet, Rfl: 6   DOXOrubicin  HCl (ADRIAMYCIN  IV), Inject into the vein every 21 ( twenty-one) days., Disp: , Rfl:    furosemide  (LASIX ) 40 MG tablet, Take 40  mg by mouth daily., Disp: , Rfl:    lenalidomide  (REVLIMID ) 10 MG capsule, Take 1 capsule (10 mg total) by mouth daily. Take for 21 days on, 7 days off., Disp: 21 capsule, Rfl: 0   lidocaine -prilocaine  (EMLA ) cream, Apply 1 Application topically as needed., Disp: 30 g, Rfl: 1    lidocaine -prilocaine  (EMLA ) cream, Apply to affected area once, Disp: 30 g, Rfl: 3   losartan  (COZAAR ) 25 MG tablet, Take 12.5 mg by mouth daily., Disp: , Rfl:    metoprolol  succinate (TOPROL -XL) 25 MG 24 hr tablet, Take 0.5 tablets (12.5 mg total) by mouth daily., Disp: 30 tablet, Rfl: 0   ondansetron  (ZOFRAN ) 4 MG tablet, Take 1 tablet (4 mg total) by mouth every 8 (eight) hours as needed., Disp: 30 tablet, Rfl: 1   oxyCODONE  (ROXICODONE ) 5 MG immediate release tablet, Take 1 tablet (5 mg total) by mouth every 4 (four) hours as needed for severe pain (pain score 7-10) or breakthrough pain., Disp: 5 tablet, Rfl: 0   potassium chloride  SA (KLOR-CON  M) 20 MEQ tablet, TAKE TWO TABLETS BY MOUTH DAILY, Disp: 180 tablet, Rfl: 11   predniSONE  (DELTASONE ) 20 MG tablet, Take 3 tablets (60 mg total) by mouth daily. Take with food on days 1-5 of chemotherapy., Disp: 25 tablet, Rfl: 5   prochlorperazine  (COMPAZINE ) 10 MG tablet, Take 1 tablet (10 mg total) by mouth every 6 (six) hours as needed for nausea or vomiting., Disp: 30 tablet, Rfl: 6   promethazine-dextromethorphan  (PROMETHAZINE-DM) 6.25-15 MG/5ML syrup, Take 5 mLs by mouth every 6 (six) hours as needed., Disp: , Rfl:    riTUXimab  (RITUXAN  IV), Inject into the vein every 21 ( twenty-one) days., Disp: , Rfl:    VINCRISTINE  SULFATE IV, Inject into the vein every 21 ( twenty-one) days., Disp: , Rfl:  No current facility-administered medications for this visit.  Facility-Administered Medications Ordered in Other Visits:    0.9 %  sodium chloride  infusion, , Intravenous, Continuous, Rogers Hai, MD, Stopped at 03/29/23 1333   sodium chloride  flush (NS) 0.9 % injection 10 mL, 10 mL, Intracatheter, PRN, Karly Pitter, MD, 10 mL at 03/29/23 1336   Allergies: Allergies  Allergen Reactions   Penicillins Rash    REVIEW OF SYSTEMS:   Review of Systems  Constitutional:  Negative for chills, fatigue and fever.  HENT:   Negative for  lump/mass, mouth sores, nosebleeds, sore throat and trouble swallowing.   Eyes:  Negative for eye problems.  Respiratory:  Negative for cough and shortness of breath.   Cardiovascular:  Negative for chest pain, leg swelling and palpitations.  Gastrointestinal:  Negative for abdominal pain, constipation, diarrhea, nausea and vomiting.  Genitourinary:  Negative for bladder incontinence, difficulty urinating, dysuria, frequency, hematuria and nocturia.   Musculoskeletal:  Negative for arthralgias, back pain, flank pain, myalgias and neck pain.  Skin:  Negative for itching and rash.  Neurological:  Negative for dizziness, headaches and numbness.  Hematological:  Does not bruise/bleed easily.  Psychiatric/Behavioral:  Negative for depression, sleep disturbance and suicidal ideas. The patient is not nervous/anxious.   All other systems reviewed and are negative.    VITALS:   There were no vitals taken for this visit.  Wt Readings from Last 3 Encounters:  08/18/23 123 lb 6.4 oz (56 kg)  07/20/23 125 lb (56.7 kg)  06/29/23 126 lb 12.2 oz (57.5 kg)    There is no height or weight on file to calculate BMI.  Performance status (ECOG): 1 - Symptomatic but completely  ambulatory  PHYSICAL EXAM:   Physical Exam Vitals and nursing note reviewed. Exam conducted with a chaperone present.  Constitutional:      Appearance: Normal appearance.   Cardiovascular:     Rate and Rhythm: Normal rate and regular rhythm.     Pulses: Normal pulses.     Heart sounds: Normal heart sounds.  Pulmonary:     Effort: Pulmonary effort is normal.     Breath sounds: Normal breath sounds.  Abdominal:     Palpations: Abdomen is soft. There is no hepatomegaly, splenomegaly or mass.     Tenderness: There is no abdominal tenderness.   Musculoskeletal:     Right lower leg: 1+ Edema present.     Left lower leg: 1+ Edema present.  Lymphadenopathy:     Cervical: No cervical adenopathy.     Right cervical: No  superficial, deep or posterior cervical adenopathy.    Left cervical: No superficial, deep or posterior cervical adenopathy.     Upper Body:     Right upper body: No supraclavicular or axillary adenopathy.     Left upper body: No supraclavicular or axillary adenopathy.     Comments: +Right medial calf mass is stable and not warm upon palpation   Neurological:     General: No focal deficit present.     Mental Status: She is alert and oriented to person, place, and time.   Psychiatric:        Mood and Affect: Mood normal.        Behavior: Behavior normal.     LABS:   CBC     Component Value Date/Time   WBC 6.2 08/18/2023 0945   RBC 3.17 (L) 08/18/2023 0945   HGB 7.8 (L) 08/18/2023 0945   HCT 25.6 (L) 08/18/2023 0945   PLT 573 (H) 08/18/2023 0945   MCV 80.8 08/18/2023 0945   MCH 24.6 (L) 08/18/2023 0945   MCHC 30.5 08/18/2023 0945   RDW 17.3 (H) 08/18/2023 0945   LYMPHSABS 1.4 08/18/2023 0945   MONOABS 1.3 (H) 08/18/2023 0945   EOSABS 0.1 08/18/2023 0945   BASOSABS 0.0 08/18/2023 0945    CMP      Component Value Date/Time   NA 142 08/18/2023 0945   K 3.6 08/18/2023 0945   CL 110 08/18/2023 0945   CO2 25 08/18/2023 0945   GLUCOSE 108 (H) 08/18/2023 0945   BUN 20 08/18/2023 0945   CREATININE 1.14 (H) 08/18/2023 0945   CALCIUM  8.6 (L) 08/18/2023 0945   PROT 5.4 (L) 08/18/2023 0945   ALBUMIN 3.1 (L) 08/18/2023 0945   AST 13 (L) 08/18/2023 0945   ALT 8 08/18/2023 0945   ALKPHOS 71 08/18/2023 0945   BILITOT 0.7 08/18/2023 0945   GFRNONAA 46 (L) 08/18/2023 0945   GFRAA 43 (L) 08/01/2019 1326     No results found for: CEA1, CEA / No results found for: CEA1, CEA No results found for: PSA1 No results found for: CAN199 No results found for: RJW874  Lab Results  Component Value Date   TOTALPROTELP 4.3 (L) 06/08/2023   ALBUMINELP 2.8 (L) 06/08/2023   A1GS 0.2 06/08/2023   A2GS 0.4 06/08/2023   BETS 0.6 (L) 06/08/2023   GAMS 0.3 (L) 06/08/2023    MSPIKE 0.1 (H) 06/08/2023   SPEI Comment 06/08/2023   Lab Results  Component Value Date   TIBC 358 04/19/2023   TIBC 364 03/16/2023   TIBC 375 09/15/2022   FERRITIN 10 (L) 04/19/2023   FERRITIN 6 (L)  03/16/2023   FERRITIN 7 (L) 09/15/2022   IRONPCTSAT 4 (L) 04/19/2023   IRONPCTSAT 12 03/16/2023   IRONPCTSAT 5 (L) 09/15/2022   Lab Results  Component Value Date   LDH 122 03/02/2023   LDH 154 09/30/2021   LDH 148 08/05/2021     STUDIES:   NM PET Image Restage (PS) Whole Body Result Date: 08/18/2023 CLINICAL DATA:  Subsequent treatment strategy for B-cell lymphoma of lower extremity. Restaging. Chemotherapy in May. EXAM: NUCLEAR MEDICINE PET WHOLE BODY TECHNIQUE: 6.6 mCi F-18 FDG was injected intravenously. Full-ring PET imaging was performed from the head to foot after the radiotracer. CT data was obtained and used for attenuation correction and anatomic localization. Fasting blood glucose: 119 mg/dl COMPARISON:  95/89/7974 FINDINGS: Mediastinal blood pool activity: SUV max 2.1 HEAD/NECK: No areas of abnormal hypermetabolism. Incidental CT findings: Bilateral carotid atherosclerosis. No cervical adenopathy. CHEST: No pulmonary parenchymal or thoracic nodal hypermetabolism. Incidental CT findings: Port-A-Cath tip mid right atrium. Cardiomegaly. Aortic and coronary artery calcification. Tiny bilateral pleural effusions, new. Pulmonary artery enlargement, outflow tract 3.1 cm. ABDOMEN/PELVIS: No abdominopelvic nodal hypermetabolism. There is gastric fundal wall thickening and hypermetabolism. Example at up to 2.9 cm and a S.U.V. max of 5.0 on 144/202. Hypermetabolism is new. Wall thickening is similar. Left adrenal nodularity at a S.U.V. max of 3.1 and 1.0 cm. Similar to the prior and low-density, most consistent with an adenoma. Incidental CT findings: Splenectomy. Low-density bilateral renal lesions are likely cysts and do not warrant specific imaging follow-up. Cholecystectomy. Right pericolic  gutter simple fluid collection of maximally 5.2 cm is similar to on the prior. Scattered colonic diverticula. Hysterectomy. Pelvic floor laxity. SKELETON: No abnormal marrow activity. Incidental CT findings: none EXTREMITIES: The hypermetabolic soft tissue density lesion anteromedial to the right tibia measures 4.4 x 1.7 cm and a S.U.V. max of 10.0 on 386/202. Compare 4.5 x 1.6 cm and a S.U.V. max of 6.4 on the prior exam (when remeasured). Incidental CT findings: Anasarca. IMPRESSION: 1. Similar size and mild increased hypermetabolism involving the soft tissue mass within the right lower extremity. (Deauville) 4 2. New gastric fundal hypermetabolism with persistent wall thickening. This could represent gastritis or developing lymphoma. Consider endoscopy. 3. Cardiomegaly with new tiny bilateral pleural effusions. Question mild fluid overload given anasarca. 4. Incidental findings, including: Coronary artery atherosclerosis. Aortic Atherosclerosis (ICD10-I70.0). Pulmonary artery enlargement suggests pulmonary arterial hypertension. Electronically Signed   By: Rockey Kilts M.D.   On: 08/18/2023 09:19

## 2023-08-18 NOTE — Progress Notes (Signed)
 Patients port flushed without difficulty.  Good blood return noted with no bruising or swelling noted at site.  Band aid applied.  VSS with discharge and left in satisfactory condition with no s/s of distress noted.

## 2023-08-19 ENCOUNTER — Inpatient Hospital Stay

## 2023-08-19 ENCOUNTER — Ambulatory Visit: Admitting: Internal Medicine

## 2023-08-19 DIAGNOSIS — D631 Anemia in chronic kidney disease: Secondary | ICD-10-CM | POA: Diagnosis not present

## 2023-08-19 DIAGNOSIS — C8335 Diffuse large B-cell lymphoma, lymph nodes of inguinal region and lower limb: Secondary | ICD-10-CM | POA: Diagnosis not present

## 2023-08-19 DIAGNOSIS — I2510 Atherosclerotic heart disease of native coronary artery without angina pectoris: Secondary | ICD-10-CM | POA: Diagnosis not present

## 2023-08-19 DIAGNOSIS — E876 Hypokalemia: Secondary | ICD-10-CM | POA: Diagnosis not present

## 2023-08-19 DIAGNOSIS — D75839 Thrombocytosis, unspecified: Secondary | ICD-10-CM | POA: Diagnosis not present

## 2023-08-19 DIAGNOSIS — Z9081 Acquired absence of spleen: Secondary | ICD-10-CM | POA: Diagnosis not present

## 2023-08-19 DIAGNOSIS — I2699 Other pulmonary embolism without acute cor pulmonale: Secondary | ICD-10-CM | POA: Diagnosis not present

## 2023-08-19 DIAGNOSIS — D509 Iron deficiency anemia, unspecified: Secondary | ICD-10-CM | POA: Diagnosis not present

## 2023-08-19 DIAGNOSIS — I700 Atherosclerosis of aorta: Secondary | ICD-10-CM | POA: Diagnosis not present

## 2023-08-19 DIAGNOSIS — I130 Hypertensive heart and chronic kidney disease with heart failure and stage 1 through stage 4 chronic kidney disease, or unspecified chronic kidney disease: Secondary | ICD-10-CM | POA: Diagnosis not present

## 2023-08-19 DIAGNOSIS — Z7901 Long term (current) use of anticoagulants: Secondary | ICD-10-CM | POA: Diagnosis not present

## 2023-08-19 DIAGNOSIS — M79661 Pain in right lower leg: Secondary | ICD-10-CM | POA: Diagnosis not present

## 2023-08-19 DIAGNOSIS — I739 Peripheral vascular disease, unspecified: Secondary | ICD-10-CM | POA: Diagnosis not present

## 2023-08-19 DIAGNOSIS — D649 Anemia, unspecified: Secondary | ICD-10-CM

## 2023-08-19 DIAGNOSIS — Z7961 Long term (current) use of immunomodulator: Secondary | ICD-10-CM | POA: Diagnosis not present

## 2023-08-19 DIAGNOSIS — E7800 Pure hypercholesterolemia, unspecified: Secondary | ICD-10-CM | POA: Diagnosis not present

## 2023-08-19 DIAGNOSIS — N189 Chronic kidney disease, unspecified: Secondary | ICD-10-CM | POA: Diagnosis not present

## 2023-08-19 LAB — KAPPA/LAMBDA LIGHT CHAINS
Kappa free light chain: 22.6 mg/L — ABNORMAL HIGH (ref 3.3–19.4)
Kappa, lambda light chain ratio: 3.53 — ABNORMAL HIGH (ref 0.26–1.65)
Lambda free light chains: 6.4 mg/L (ref 5.7–26.3)

## 2023-08-19 MED ORDER — DIPHENHYDRAMINE HCL 25 MG PO CAPS
25.0000 mg | ORAL_CAPSULE | Freq: Once | ORAL | Status: AC
  Administered 2023-08-19: 25 mg via ORAL
  Filled 2023-08-19: qty 1

## 2023-08-19 MED ORDER — SODIUM CHLORIDE 0.9% FLUSH
10.0000 mL | INTRAVENOUS | Status: AC | PRN
  Administered 2023-08-19: 10 mL

## 2023-08-19 MED ORDER — ACETAMINOPHEN 325 MG PO TABS
650.0000 mg | ORAL_TABLET | Freq: Once | ORAL | Status: AC
  Administered 2023-08-19: 650 mg via ORAL
  Filled 2023-08-19: qty 2

## 2023-08-19 MED ORDER — HEPARIN SOD (PORK) LOCK FLUSH 100 UNIT/ML IV SOLN
500.0000 [IU] | Freq: Every day | INTRAVENOUS | Status: AC | PRN
  Administered 2023-08-19: 500 [IU]

## 2023-08-19 MED ORDER — SODIUM CHLORIDE 0.9% IV SOLUTION
250.0000 mL | INTRAVENOUS | Status: DC
  Administered 2023-08-19: 250 mL via INTRAVENOUS

## 2023-08-19 NOTE — Progress Notes (Signed)
 Patient presents today for 1 unit of blood per provider's orders. Vital signs stable and patient voiced no new complaints at this time.  Discharged from clinic via wheelchair in stable condition. Alert and oriented x 3. F/U with [facility] as scheduled.

## 2023-08-19 NOTE — Patient Instructions (Signed)
 [facility] - A DEPT OF [facility]  Discharge Instructions: Thank you for choosing [facility] to provide your oncology and hematology care.  If you have a lab appointment with the Cancer Center - please note that after April 8th, 2024, all labs will be drawn in the cancer center.  You do not have to check in or register with the main entrance as you have in the past but will complete your check-in in the cancer center.  Wear comfortable clothing and clothing appropriate for easy access to any Portacath or PICC line.   We strive to give you quality time with your provider. You may need to reschedule your appointment if you arrive late (15 or more minutes).  Arriving late affects you and other patients whose appointments are after yours.  Also, if you miss three or more appointments without notifying the office, you may be dismissed from the clinic at the provider's discretion.      For prescription refill requests, have your pharmacy contact our office and allow 72 hours for refills to be completed.    Today you received 1 unit of blood.   BELOW ARE SYMPTOMS THAT SHOULD BE REPORTED IMMEDIATELY: *FEVER GREATER THAN 100.4 F (38 C) OR HIGHER *CHILLS OR SWEATING *NAUSEA AND VOMITING THAT IS NOT CONTROLLED WITH YOUR NAUSEA MEDICATION *UNUSUAL SHORTNESS OF BREATH *UNUSUAL BRUISING OR BLEEDING *URINARY PROBLEMS (pain or burning when urinating, or frequent urination) *BOWEL PROBLEMS (unusual diarrhea, constipation, pain near the anus) TENDERNESS IN MOUTH AND THROAT WITH OR WITHOUT PRESENCE OF ULCERS (sore throat, sores in mouth, or a toothache) UNUSUAL RASH, SWELLING OR PAIN  UNUSUAL VAGINAL DISCHARGE OR ITCHING   Items with * indicate a potential emergency and should be followed up as soon as possible or go to the Emergency Department if any problems should occur.  Please show the CHEMOTHERAPY ALERT CARD or IMMUNOTHERAPY ALERT CARD at check-in to  the Emergency Department and triage nurse.  Should you have questions after your visit or need to cancel or reschedule your appointment, please contact [facility] - A DEPT OF [facility] [phone]  and follow the prompts.  Office hours are 8:00 a.m. to 4:30 p.m. Monday - Friday. Please note that voicemails left after 4:00 p.m. may not be returned until the following business day.  We are closed weekends and major holidays. You have access to a nurse at all times for urgent questions. Please call the main number to the clinic [phone] and follow the prompts.  For any non-urgent questions, you may also contact your provider using MyChart. We now offer e-Visits for anyone 25 and older to request care online for non-urgent symptoms. For details visit [email or web address].   Also download the MyChart app! Go to the app store, search "MyChart", open the app, select [facility], and log in with your MyChart username and password.

## 2023-08-20 LAB — BPAM RBC
Blood Product Expiration Date: 202508042359
ISSUE DATE / TIME: 202506271057
Unit Type and Rh: 5100

## 2023-08-20 LAB — TYPE AND SCREEN
Antibody Screen: NEGATIVE
Unit division: 0

## 2023-08-21 ENCOUNTER — Other Ambulatory Visit: Payer: Self-pay

## 2023-08-22 ENCOUNTER — Ambulatory Visit: Admitting: Physician Assistant

## 2023-08-22 LAB — IMMUNOFIXATION ELECTROPHORESIS
IgA: 53 mg/dL — ABNORMAL LOW (ref 64–422)
IgG (Immunoglobin G), Serum: 306 mg/dL — ABNORMAL LOW (ref 586–1602)
IgM (Immunoglobulin M), Srm: 47 mg/dL (ref 26–217)
Total Protein ELP: 5.1 g/dL — ABNORMAL LOW (ref 6.0–8.5)

## 2023-08-22 LAB — PROTEIN ELECTROPHORESIS, SERUM
A/G Ratio: 2.1 — ABNORMAL HIGH (ref 0.7–1.7)
Albumin ELP: 3.4 g/dL (ref 2.9–4.4)
Alpha-1-Globulin: 0.2 g/dL (ref 0.0–0.4)
Alpha-2-Globulin: 0.5 g/dL (ref 0.4–1.0)
Beta Globulin: 0.7 g/dL (ref 0.7–1.3)
Gamma Globulin: 0.2 g/dL — ABNORMAL LOW (ref 0.4–1.8)
Globulin, Total: 1.6 g/dL — ABNORMAL LOW (ref 2.2–3.9)
M-Spike, %: 0.1 g/dL — ABNORMAL HIGH
Total Protein ELP: 5 g/dL — ABNORMAL LOW (ref 6.0–8.5)

## 2023-08-24 DIAGNOSIS — R2241 Localized swelling, mass and lump, right lower limb: Secondary | ICD-10-CM | POA: Diagnosis not present

## 2023-08-24 DIAGNOSIS — C8335 Diffuse large B-cell lymphoma, lymph nodes of inguinal region and lower limb: Secondary | ICD-10-CM | POA: Diagnosis not present

## 2023-08-24 DIAGNOSIS — C8590 Non-Hodgkin lymphoma, unspecified, unspecified site: Secondary | ICD-10-CM | POA: Diagnosis not present

## 2023-08-25 ENCOUNTER — Encounter (INDEPENDENT_AMBULATORY_CARE_PROVIDER_SITE_OTHER): Payer: Self-pay | Admitting: Gastroenterology

## 2023-08-25 ENCOUNTER — Ambulatory Visit (INDEPENDENT_AMBULATORY_CARE_PROVIDER_SITE_OTHER): Admitting: Gastroenterology

## 2023-08-25 VITALS — BP 112/61 | HR 108 | Temp 97.3°F | Ht 65.0 in | Wt 126.1 lb

## 2023-08-25 DIAGNOSIS — R948 Abnormal results of function studies of other organs and systems: Secondary | ICD-10-CM

## 2023-08-25 NOTE — Patient Instructions (Signed)
 Schedule EGD

## 2023-08-25 NOTE — Progress Notes (Signed)
 Toribio Fortune, M.D. Gastroenterology & Hepatology [facility] Gastroenterology [address] [place], [place] [address] Primary Care Physician: Carlette Benita Area, MD [address] [place] [address]  Referring MD: PCP  Chief Complaint: Abnormal PET scan  History of Present Illness: Leslie Williamson is a 89 y.o. 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  Patient is currently followed by the oncology clinic.  Patient was referred due to new findings on PET scan concerning for gastric fundal hypermetabolism and persistent wall thickening concerning for malignancy.  PET scan from 08/11/2023: Similar size and mild increase in hypermetabolic involving soft tissue mass in the right lower extremity, Deauville 4. New gastric fundal hypermetabolism with persistent wall thickening.   Patient denies any complaints. The patient denies having any nausea, vomiting, fever, chills, hematochezia, melena, hematemesis, abdominal distention, abdominal pain, diarrhea, jaundice, pruritus. Has had some mild weight loss, but has been in the 120s range recently.  Last ZHI:wzczm Last Colonoscopy:possibly 2002, repot states normal.  FHx: neg for any gastrointestinal/liver disease, no malignancies Social: neg smoking, alcohol  or illicit drug use Surgical:splenectomy  Past Medical History: Past Medical History:  Diagnosis Date   Anxiety    Aortic atherosclerosis (HCC)    Complication of anesthesia    difficult to wake up   Depression    Heart failure with mildly reduced ejection fraction (HFmrEF)  (HCC)    Hypercholesteremia    Hypertension    Hypokalemia    Iron  deficiency anemia    Left hip pain    NHL (non-Hodgkin's lymphoma) (HCC) 12/17/2010   Obesity    Primary thrombocytosis (HCC) 01/11/2006   Secondary to splenectomy     PVD (peripheral vascular disease) (HCC)    S/P splenectomy 01/01/2014   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)    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   Vertigo, labyrinthine     Past Surgical History: Past Surgical History:  Procedure Laterality Date   CATARACT EXTRACTION W/PHACO Left 11/03/2015   Procedure: CATARACT EXTRACTION PHACO AND INTRAOCULAR LENS PLACEMENT (IOC);  Surgeon: Cherene Mania, MD;  Location: [facility];  Service: Ophthalmology;  Laterality: Left;  CDE: 7.74   CATARACT EXTRACTION W/PHACO Right 11/20/2015   Procedure: CATARACT EXTRACTION PHACO AND INTRAOCULAR LENS PLACEMENT ; CDE:  8.30;  Surgeon: Cherene Mania, MD;  Location: [facility];  Service: Ophthalmology;  Laterality: Right;   IR IMAGING GUIDED PORT INSERTION  11/04/2021   MASS EXCISION Right 02/18/2023   Procedure: Incisional Biopsy Right Lower Extremity;  Surgeon: Kallie Manuelita BROCKS, MD;  Location: [facility];  Service: General;  Laterality: Right;   PORT-A-CATH REMOVAL      Family History: Family History  Problem Relation Age of Onset   Diabetes Mother     Social History: Social History   Tobacco Use  Smoking Status Former  Smokeless Tobacco Never   Social History   Substance and Sexual Activity  Alcohol  Use No   Social History   Substance and Sexual Activity  Drug Use No    Allergies: Allergies  Allergen Reactions   Penicillins Rash    Medications: Current Outpatient Medications  Medication Sig Dispense Refill   acetaminophen  (TYLENOL ) 500 MG tablet Take 1,000  mg by mouth every 6 (six) hours as needed for moderate pain.     acyclovir  (ZOVIRAX ) 400 MG tablet Take 1 tablet (400 mg total) by mouth 2 (two) times daily. 60 tablet 6    allopurinol  (ZYLOPRIM ) 300 MG tablet Take 1 tablet (300 mg total) by mouth daily. 30 tablet 0   apixaban  (ELIQUIS ) 5 MG TABS tablet Take 1 tablet (5 mg total) by mouth 2 (two) times daily. 60 tablet 1   atorvastatin  (LIPITOR) 40 MG tablet Take 40 mg by mouth at bedtime.     dapagliflozin  propanediol (FARXIGA ) 10 MG TABS tablet Take 1 tablet (10 mg total) by mouth daily. 30 tablet 1   furosemide  (LASIX ) 40 MG tablet Take 40 mg by mouth daily.     lenalidomide  (REVLIMID ) 10 MG capsule Take 1 capsule (10 mg total) by mouth daily. Take for 21 days on, 7 days off. 21 capsule 0   lidocaine -prilocaine  (EMLA ) cream Apply 1 Application topically as needed. 30 g 1   lidocaine -prilocaine  (EMLA ) cream Apply to affected area once 30 g 3   potassium chloride  SA (KLOR-CON  M) 20 MEQ tablet TAKE TWO TABLETS BY MOUTH DAILY 180 tablet 11   CYCLOPHOSPHAMIDE  IV Inject into the vein every 21 ( twenty-one) days. (Patient not taking: Reported on 08/25/2023)     dexamethasone  (DECADRON ) 4 MG tablet Take 10 mg (2.5 tablets) by mouth weekly every Wednesday morning Per note by Dr. Rogers on 09/15/2022 (Patient not taking: Reported on 08/25/2023) 20 tablet 6   DOXOrubicin  HCl (ADRIAMYCIN  IV) Inject into the vein every 21 ( twenty-one) days. (Patient not taking: Reported on 08/25/2023)     riTUXimab  (RITUXAN  IV) Inject into the vein every 21 ( twenty-one) days. (Patient not taking: Reported on 08/25/2023)     VINCRISTINE  SULFATE IV Inject into the vein every 21 ( twenty-one) days. (Patient not taking: Reported on 08/25/2023)     No current facility-administered medications for this visit.   Facility-Administered Medications Ordered in Other Visits  Medication Dose Route Frequency Provider Last Rate Last Admin   0.9 %  sodium chloride  infusion   Intravenous Continuous Katragadda, Sreedhar, MD   Stopped at 03/29/23 1333   sodium chloride  flush (NS) 0.9 % injection 10 mL  10 mL Intracatheter PRN Rogers Hai, MD   10 mL at  03/29/23 1336    Review of Systems: GENERAL: negative for malaise, night sweats HEENT: No changes in hearing or vision, no nose bleeds or other nasal problems. NECK: Negative for lumps, goiter, pain and significant neck swelling RESPIRATORY: Negative for cough, wheezing CARDIOVASCULAR: Negative for chest pain, leg swelling, palpitations, orthopnea GI: SEE HPI MUSCULOSKELETAL: Negative for joint pain or swelling, back pain, and muscle pain. SKIN: Negative for lesions, rash PSYCH: Negative for sleep disturbance, mood disorder and recent psychosocial stressors. HEMATOLOGY Negative for prolonged bleeding, bruising easily, and swollen nodes. ENDOCRINE: Negative for cold or heat intolerance, polyuria, polydipsia and goiter. NEURO: negative for tremor, gait imbalance, syncope and seizures. The remainder of the review of systems is noncontributory.   Physical Exam: BP 112/61 (BP Location: Left Arm, Patient Position: Sitting, Cuff Size: Normal)   Pulse (!) 108   Temp (!) 97.3 F (36.3 C) (Temporal)   Ht 5' 5 (1.651 m)   Wt 126 lb 1.6 oz (57.2 kg)   BMI 20.98 kg/m  GENERAL: The patient is AO x3, in no acute distress. HEENT: Head is normocephalic and atraumatic. EOMI are intact. Mouth is well hydrated and without lesions. NECK:  Supple. No masses LUNGS: Clear to auscultation. No presence of rhonchi/wheezing/rales. Adequate chest expansion HEART: RRR, normal s1 and s2. ABDOMEN: Soft, nontender, no guarding, no peritoneal signs, and nondistended. BS +. No masses. EXTREMITIES: Without any cyanosis, clubbing, rash, lesions or edema. NEUROLOGIC: AOx3, no focal motor deficit. SKIN: no jaundice, no rashes   Imaging/Labs: as above  I personally reviewed and interpreted the available labs, imaging and endoscopic files.  Impression and Plan: Leslie Williamson is a 89 y.o. 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  Patient was found to have increased metabolic activity in the fundus of the stomach during the recent PET scan, which was ordered for surveillance of her blood malignancy.  She has been asymptomatic and denies any gastrointestinal complaints.  I discussed with the patient and the son we will need to proceed with an EGD to further evaluate this.  She is in agreement with endoscopy.  -Schedule EGD  All questions were answered.      Toribio Fortune, MD Gastroenterology and Hepatology [facility] Gastroenterology

## 2023-08-29 DIAGNOSIS — C8595 Non-Hodgkin lymphoma, unspecified, lymph nodes of inguinal region and lower limb: Secondary | ICD-10-CM | POA: Diagnosis not present

## 2023-08-29 DIAGNOSIS — R2241 Localized swelling, mass and lump, right lower limb: Secondary | ICD-10-CM | POA: Diagnosis not present

## 2023-08-29 DIAGNOSIS — R600 Localized edema: Secondary | ICD-10-CM | POA: Diagnosis not present

## 2023-08-29 DIAGNOSIS — C8335 Diffuse large B-cell lymphoma, lymph nodes of inguinal region and lower limb: Secondary | ICD-10-CM | POA: Diagnosis not present

## 2023-08-30 ENCOUNTER — Telehealth: Payer: Self-pay | Admitting: *Deleted

## 2023-08-30 NOTE — Telephone Encounter (Signed)
  Request for patient to stop medication prior to procedure or is needing cleareance  08/30/23  Leslie Williamson 1931-11-30  What type of surgery is being performed? Esophagogastroduodenoscopy (EGD)  When is surgery scheduled? TBD  What type of clearance is required (medical or pharmacy to hold medication or both? medication  Are there any medications that need to be held prior to surgery and how long? Eliquis  x 2 days  Name of physician performing surgery?  Agatha Rouse Gastroenterology at [facility] Phone: [phone] Fax: [phone]  Anethesia type (none, local, MAC, general)? MAC

## 2023-08-30 NOTE — Telephone Encounter (Signed)
   Patient Name: Leslie Williamson  DOB: October 25, 1931 MRN: [id number]  Primary Cardiologist: None  Chart reviewed as part of pre-operative protocol coverage.  Patient takes Eliquis  for history of PE.  This is not managed by cardiology.  Recommendations for holding Eliquis  prior to procedure should come from managing provider (hematology).  I will route this recommendation to the requesting party via Epic fax function and remove from pre-op pool.  Please call with questions.  Damien JAYSON Braver, NP 08/30/2023, 12:06 PM

## 2023-09-01 ENCOUNTER — Telehealth (INDEPENDENT_AMBULATORY_CARE_PROVIDER_SITE_OTHER): Payer: Self-pay | Admitting: Gastroenterology

## 2023-09-01 NOTE — Telephone Encounter (Signed)
 Son Trecia Maring) called in and asked had we called to schedule pt EGD. Advised son that we are waiting on clearance to hold blood thinner. Pt son verbalized understanding and wanted to make sure pt had not missed our call. Lynwood asked if we could call him to schedule her when we get clearance. His number is [phone]

## 2023-09-05 ENCOUNTER — Encounter (HOSPITAL_COMMUNITY): Payer: Self-pay

## 2023-09-05 ENCOUNTER — Emergency Department (HOSPITAL_COMMUNITY)

## 2023-09-05 ENCOUNTER — Inpatient Hospital Stay (HOSPITAL_COMMUNITY)
Admission: EM | Admit: 2023-09-05 | Discharge: 2023-09-14 | DRG: 280 | Disposition: A | Discharge status: Hospice | Attending: Internal Medicine | Admitting: Internal Medicine

## 2023-09-05 ENCOUNTER — Other Ambulatory Visit: Payer: Self-pay

## 2023-09-05 DIAGNOSIS — F419 Anxiety disorder, unspecified: Secondary | ICD-10-CM | POA: Diagnosis present

## 2023-09-05 DIAGNOSIS — Z833 Family history of diabetes mellitus: Secondary | ICD-10-CM

## 2023-09-05 DIAGNOSIS — I21A1 Myocardial infarction type 2: Secondary | ICD-10-CM | POA: Diagnosis not present

## 2023-09-05 DIAGNOSIS — E872 Acidosis, unspecified: Secondary | ICD-10-CM | POA: Diagnosis not present

## 2023-09-05 DIAGNOSIS — I427 Cardiomyopathy due to drug and external agent: Secondary | ICD-10-CM | POA: Diagnosis present

## 2023-09-05 DIAGNOSIS — I5021 Acute systolic (congestive) heart failure: Secondary | ICD-10-CM | POA: Diagnosis not present

## 2023-09-05 DIAGNOSIS — Z91128 Patient's intentional underdosing of medication regimen for other reason: Secondary | ICD-10-CM

## 2023-09-05 DIAGNOSIS — E7800 Pure hypercholesterolemia, unspecified: Secondary | ICD-10-CM | POA: Diagnosis present

## 2023-09-05 DIAGNOSIS — K761 Chronic passive congestion of liver: Secondary | ICD-10-CM | POA: Diagnosis not present

## 2023-09-05 DIAGNOSIS — F32A Depression, unspecified: Secondary | ICD-10-CM | POA: Diagnosis present

## 2023-09-05 DIAGNOSIS — I5033 Acute on chronic diastolic (congestive) heart failure: Secondary | ICD-10-CM

## 2023-09-05 DIAGNOSIS — C8337 Diffuse large B-cell lymphoma, spleen: Secondary | ICD-10-CM | POA: Diagnosis present

## 2023-09-05 DIAGNOSIS — R079 Chest pain, unspecified: Secondary | ICD-10-CM

## 2023-09-05 DIAGNOSIS — R54 Age-related physical debility: Secondary | ICD-10-CM | POA: Diagnosis present

## 2023-09-05 DIAGNOSIS — J9811 Atelectasis: Secondary | ICD-10-CM | POA: Diagnosis present

## 2023-09-05 DIAGNOSIS — D75839 Thrombocytosis, unspecified: Secondary | ICD-10-CM | POA: Diagnosis present

## 2023-09-05 DIAGNOSIS — I50813 Acute on chronic right heart failure: Secondary | ICD-10-CM | POA: Diagnosis not present

## 2023-09-05 DIAGNOSIS — N1831 Chronic kidney disease, stage 3a: Secondary | ICD-10-CM | POA: Diagnosis not present

## 2023-09-05 DIAGNOSIS — C9000 Multiple myeloma not having achieved remission: Secondary | ICD-10-CM | POA: Diagnosis present

## 2023-09-05 DIAGNOSIS — D473 Essential (hemorrhagic) thrombocythemia: Secondary | ICD-10-CM | POA: Diagnosis present

## 2023-09-05 DIAGNOSIS — I472 Ventricular tachycardia, unspecified: Secondary | ICD-10-CM | POA: Diagnosis not present

## 2023-09-05 DIAGNOSIS — I420 Dilated cardiomyopathy: Secondary | ICD-10-CM | POA: Diagnosis not present

## 2023-09-05 DIAGNOSIS — R0609 Other forms of dyspnea: Secondary | ICD-10-CM | POA: Diagnosis not present

## 2023-09-05 DIAGNOSIS — I739 Peripheral vascular disease, unspecified: Secondary | ICD-10-CM | POA: Diagnosis not present

## 2023-09-05 DIAGNOSIS — I5023 Acute on chronic systolic (congestive) heart failure: Secondary | ICD-10-CM | POA: Diagnosis not present

## 2023-09-05 DIAGNOSIS — R570 Cardiogenic shock: Secondary | ICD-10-CM | POA: Diagnosis not present

## 2023-09-05 DIAGNOSIS — I5043 Acute on chronic combined systolic (congestive) and diastolic (congestive) heart failure: Secondary | ICD-10-CM | POA: Diagnosis not present

## 2023-09-05 DIAGNOSIS — Z66 Do not resuscitate: Secondary | ICD-10-CM | POA: Diagnosis present

## 2023-09-05 DIAGNOSIS — N179 Acute kidney failure, unspecified: Secondary | ICD-10-CM | POA: Diagnosis present

## 2023-09-05 DIAGNOSIS — I509 Heart failure, unspecified: Principal | ICD-10-CM

## 2023-09-05 DIAGNOSIS — I340 Nonrheumatic mitral (valve) insufficiency: Secondary | ICD-10-CM | POA: Diagnosis not present

## 2023-09-05 DIAGNOSIS — J90 Pleural effusion, not elsewhere classified: Secondary | ICD-10-CM | POA: Diagnosis not present

## 2023-09-05 DIAGNOSIS — Z79899 Other long term (current) drug therapy: Secondary | ICD-10-CM

## 2023-09-05 DIAGNOSIS — Z9081 Acquired absence of spleen: Secondary | ICD-10-CM

## 2023-09-05 DIAGNOSIS — D631 Anemia in chronic kidney disease: Secondary | ICD-10-CM | POA: Diagnosis present

## 2023-09-05 DIAGNOSIS — R4189 Other symptoms and signs involving cognitive functions and awareness: Secondary | ICD-10-CM | POA: Diagnosis present

## 2023-09-05 DIAGNOSIS — E669 Obesity, unspecified: Secondary | ICD-10-CM | POA: Diagnosis not present

## 2023-09-05 DIAGNOSIS — Z7189 Other specified counseling: Secondary | ICD-10-CM | POA: Diagnosis not present

## 2023-09-05 DIAGNOSIS — Z91148 Patient's other noncompliance with medication regimen for other reason: Secondary | ICD-10-CM

## 2023-09-05 DIAGNOSIS — T451X5A Adverse effect of antineoplastic and immunosuppressive drugs, initial encounter: Secondary | ICD-10-CM | POA: Diagnosis not present

## 2023-09-05 DIAGNOSIS — R000 Tachycardia, unspecified: Secondary | ICD-10-CM | POA: Diagnosis not present

## 2023-09-05 DIAGNOSIS — I2129 ST elevation (STEMI) myocardial infarction involving other sites: Secondary | ICD-10-CM | POA: Diagnosis not present

## 2023-09-05 DIAGNOSIS — Z8572 Personal history of non-Hodgkin lymphomas: Secondary | ICD-10-CM | POA: Diagnosis not present

## 2023-09-05 DIAGNOSIS — I5082 Biventricular heart failure: Secondary | ICD-10-CM | POA: Diagnosis not present

## 2023-09-05 DIAGNOSIS — I471 Supraventricular tachycardia, unspecified: Secondary | ICD-10-CM | POA: Diagnosis not present

## 2023-09-05 DIAGNOSIS — C8510 Unspecified B-cell lymphoma, unspecified site: Secondary | ICD-10-CM | POA: Diagnosis not present

## 2023-09-05 DIAGNOSIS — I5020 Unspecified systolic (congestive) heart failure: Secondary | ICD-10-CM | POA: Diagnosis present

## 2023-09-05 DIAGNOSIS — C8335 Diffuse large B-cell lymphoma, lymph nodes of inguinal region and lower limb: Secondary | ICD-10-CM | POA: Diagnosis not present

## 2023-09-05 DIAGNOSIS — Z87891 Personal history of nicotine dependence: Secondary | ICD-10-CM

## 2023-09-05 DIAGNOSIS — Z515 Encounter for palliative care: Secondary | ICD-10-CM | POA: Diagnosis not present

## 2023-09-05 DIAGNOSIS — R0789 Other chest pain: Secondary | ICD-10-CM | POA: Diagnosis not present

## 2023-09-05 DIAGNOSIS — C8590 Non-Hodgkin lymphoma, unspecified, unspecified site: Secondary | ICD-10-CM | POA: Diagnosis present

## 2023-09-05 DIAGNOSIS — D3502 Benign neoplasm of left adrenal gland: Secondary | ICD-10-CM | POA: Diagnosis not present

## 2023-09-05 DIAGNOSIS — I110 Hypertensive heart disease with heart failure: Secondary | ICD-10-CM | POA: Diagnosis not present

## 2023-09-05 DIAGNOSIS — D472 Monoclonal gammopathy: Secondary | ICD-10-CM | POA: Diagnosis not present

## 2023-09-05 DIAGNOSIS — I130 Hypertensive heart and chronic kidney disease with heart failure and stage 1 through stage 4 chronic kidney disease, or unspecified chronic kidney disease: Secondary | ICD-10-CM | POA: Diagnosis not present

## 2023-09-05 DIAGNOSIS — R7989 Other specified abnormal findings of blood chemistry: Secondary | ICD-10-CM | POA: Diagnosis not present

## 2023-09-05 DIAGNOSIS — Z1152 Encounter for screening for COVID-19: Secondary | ICD-10-CM

## 2023-09-05 DIAGNOSIS — K219 Gastro-esophageal reflux disease without esophagitis: Secondary | ICD-10-CM | POA: Diagnosis not present

## 2023-09-05 DIAGNOSIS — D471 Chronic myeloproliferative disease: Secondary | ICD-10-CM | POA: Diagnosis not present

## 2023-09-05 DIAGNOSIS — C969 Malignant neoplasm of lymphoid, hematopoietic and related tissue, unspecified: Secondary | ICD-10-CM | POA: Diagnosis present

## 2023-09-05 DIAGNOSIS — R2241 Localized swelling, mass and lump, right lower limb: Secondary | ICD-10-CM | POA: Diagnosis not present

## 2023-09-05 DIAGNOSIS — Z86711 Personal history of pulmonary embolism: Secondary | ICD-10-CM

## 2023-09-05 DIAGNOSIS — C8330 Diffuse large B-cell lymphoma, unspecified site: Secondary | ICD-10-CM | POA: Diagnosis not present

## 2023-09-05 DIAGNOSIS — I10 Essential (primary) hypertension: Secondary | ICD-10-CM | POA: Diagnosis not present

## 2023-09-05 DIAGNOSIS — Z7901 Long term (current) use of anticoagulants: Secondary | ICD-10-CM

## 2023-09-05 DIAGNOSIS — I700 Atherosclerosis of aorta: Secondary | ICD-10-CM | POA: Diagnosis not present

## 2023-09-05 DIAGNOSIS — Z880 Allergy status to penicillin: Secondary | ICD-10-CM

## 2023-09-05 LAB — CBC WITH DIFFERENTIAL/PLATELET
Abs Immature Granulocytes: 0.04 K/uL (ref 0.00–0.07)
Basophils Absolute: 0 K/uL (ref 0.0–0.1)
Basophils Relative: 1 %
Eosinophils Absolute: 0 K/uL (ref 0.0–0.5)
Eosinophils Relative: 0 %
HCT: 28.3 % — ABNORMAL LOW (ref 36.0–46.0)
Hemoglobin: 9 g/dL — ABNORMAL LOW (ref 12.0–15.0)
Immature Granulocytes: 1 %
Lymphocytes Relative: 19 %
Lymphs Abs: 1.1 K/uL (ref 0.7–4.0)
MCH: 23.8 pg — ABNORMAL LOW (ref 26.0–34.0)
MCHC: 31.8 g/dL (ref 30.0–36.0)
MCV: 74.9 fL — ABNORMAL LOW (ref 80.0–100.0)
Monocytes Absolute: 1.2 K/uL — ABNORMAL HIGH (ref 0.1–1.0)
Monocytes Relative: 21 %
Neutro Abs: 3.3 K/uL (ref 1.7–7.7)
Neutrophils Relative %: 58 %
Platelets: 363 K/uL (ref 150–400)
RBC: 3.78 MIL/uL — ABNORMAL LOW (ref 3.87–5.11)
RDW: 19.6 % — ABNORMAL HIGH (ref 11.5–15.5)
WBC: 5.6 K/uL (ref 4.0–10.5)
nRBC: 4.5 % — ABNORMAL HIGH (ref 0.0–0.2)

## 2023-09-05 LAB — COMPREHENSIVE METABOLIC PANEL WITH GFR
ALT: 111 U/L — ABNORMAL HIGH (ref 0–44)
AST: 70 U/L — ABNORMAL HIGH (ref 15–41)
Albumin: 3.3 g/dL — ABNORMAL LOW (ref 3.5–5.0)
Alkaline Phosphatase: 170 U/L — ABNORMAL HIGH (ref 38–126)
Anion gap: 16 — ABNORMAL HIGH (ref 5–15)
BUN: 53 mg/dL — ABNORMAL HIGH (ref 8–23)
CO2: 16 mmol/L — ABNORMAL LOW (ref 22–32)
Calcium: 8.7 mg/dL — ABNORMAL LOW (ref 8.9–10.3)
Chloride: 108 mmol/L (ref 98–111)
Creatinine, Ser: 1.91 mg/dL — ABNORMAL HIGH (ref 0.44–1.00)
GFR, Estimated: 25 mL/min — ABNORMAL LOW (ref >60)
Glucose, Bld: 115 mg/dL — ABNORMAL HIGH (ref 70–99)
Potassium: 4.4 mmol/L (ref 3.5–5.1)
Sodium: 140 mmol/L (ref 135–145)
Total Bilirubin: 1.4 mg/dL — ABNORMAL HIGH (ref 0.0–1.2)
Total Protein: 5.5 g/dL — ABNORMAL LOW (ref 6.5–8.1)

## 2023-09-05 LAB — BLOOD GAS, VENOUS
Acid-base deficit: 8.6 mmol/L — ABNORMAL HIGH (ref 0.0–2.0)
Bicarbonate: 16.6 mmol/L — ABNORMAL LOW (ref 20.0–28.0)
Drawn by: 63466
O2 Saturation: 24.8 %
Patient temperature: 37
pCO2, Ven: 33 mmHg — ABNORMAL LOW (ref 44–60)
pH, Ven: 7.31 (ref 7.25–7.43)
pO2, Ven: <31 mmHg — CL (ref 32–45)

## 2023-09-05 LAB — BRAIN NATRIURETIC PEPTIDE: B Natriuretic Peptide: >4500 pg/mL — ABNORMAL HIGH (ref 0.0–100.0)

## 2023-09-05 LAB — RESP PANEL BY RT-PCR (RSV, FLU A&B, COVID)  RVPGX2
Influenza A by PCR: NEGATIVE
Influenza B by PCR: NEGATIVE
Resp Syncytial Virus by PCR: NEGATIVE
SARS Coronavirus 2 by RT PCR: NEGATIVE

## 2023-09-05 LAB — TROPONIN I (HIGH SENSITIVITY)
Troponin I (High Sensitivity): 261 ng/L (ref <18)
Troponin I (High Sensitivity): 272 ng/L (ref <18)

## 2023-09-05 LAB — MAGNESIUM: Magnesium: 2.1 mg/dL (ref 1.7–2.4)

## 2023-09-05 MED ORDER — SODIUM CHLORIDE 0.9% FLUSH
3.0000 mL | Freq: Two times a day (BID) | INTRAVENOUS | Status: DC
  Administered 2023-09-05: 3 mL via INTRAVENOUS

## 2023-09-05 MED ORDER — ONDANSETRON HCL 4 MG/2ML IJ SOLN: 4.0000 mg | Freq: Four times a day (QID) | INTRAMUSCULAR | Status: DC | PRN

## 2023-09-05 MED ORDER — ACYCLOVIR 400 MG PO TABS
400.0000 mg | ORAL_TABLET | Freq: Two times a day (BID) | ORAL | Status: DC
  Administered 2023-09-05 – 2023-09-14 (×17): 400 mg via ORAL
  Filled 2023-09-05 (×19): qty 1

## 2023-09-05 MED ORDER — FUROSEMIDE 10 MG/ML IJ SOLN
40.0000 mg | Freq: Two times a day (BID) | INTRAMUSCULAR | Status: DC
  Administered 2023-09-06 – 2023-09-07 (×3): 40 mg via INTRAVENOUS
  Filled 2023-09-05 (×3): qty 4

## 2023-09-05 MED ORDER — SODIUM CHLORIDE 0.9% FLUSH
10.0000 mL | Freq: Two times a day (BID) | INTRAVENOUS | Status: DC
  Administered 2023-09-05: 10 mL

## 2023-09-05 MED ORDER — SODIUM CHLORIDE 0.9% FLUSH
3.0000 mL | Freq: Two times a day (BID) | INTRAVENOUS | Status: DC
  Administered 2023-09-05 – 2023-09-14 (×18): 3 mL via INTRAVENOUS

## 2023-09-05 MED ORDER — TRAZODONE HCL 50 MG PO TABS
50.0000 mg | ORAL_TABLET | Freq: Every evening | ORAL | Status: DC | PRN
  Administered 2023-09-09 – 2023-09-13 (×3): 50 mg via ORAL
  Filled 2023-09-05 (×3): qty 1

## 2023-09-05 MED ORDER — SODIUM CHLORIDE 0.9% FLUSH: 10.0000 mL | INTRAVENOUS | Status: DC | PRN

## 2023-09-05 MED ORDER — BISACODYL 10 MG RE SUPP: 10.0000 mg | Freq: Every day | RECTAL | Status: DC | PRN

## 2023-09-05 MED ORDER — SODIUM CHLORIDE 0.9% FLUSH: 3.0000 mL | INTRAVENOUS | Status: DC | PRN

## 2023-09-05 MED ORDER — POLYETHYLENE GLYCOL 3350 17 G PO PACK
17.0000 g | PACK | Freq: Every day | ORAL | Status: DC | PRN
Start: 2023-09-05 — End: 2023-09-14
  Administered 2023-09-11: 17 g via ORAL
  Filled 2023-09-05: qty 1

## 2023-09-05 MED ORDER — ALLOPURINOL 300 MG PO TABS
300.0000 mg | ORAL_TABLET | Freq: Every day | ORAL | Status: DC
  Administered 2023-09-05 – 2023-09-14 (×10): 300 mg via ORAL
  Filled 2023-09-05 (×10): qty 1

## 2023-09-05 MED ORDER — LENALIDOMIDE 10 MG PO CAPS: 10.0000 mg | ORAL_CAPSULE | Freq: Every day | ORAL | Status: DC

## 2023-09-05 MED ORDER — SODIUM CHLORIDE 0.9 % IV SOLN
INTRAVENOUS | Status: AC | PRN
Start: 2023-09-05 — End: 2023-09-06

## 2023-09-05 MED ORDER — APIXABAN 5 MG PO TABS
5.0000 mg | ORAL_TABLET | Freq: Two times a day (BID) | ORAL | Status: DC
  Administered 2023-09-05: 5 mg via ORAL
  Filled 2023-09-05 (×2): qty 1

## 2023-09-05 MED ORDER — DAPAGLIFLOZIN PROPANEDIOL 10 MG PO TABS
10.0000 mg | ORAL_TABLET | Freq: Every day | ORAL | Status: DC
  Administered 2023-09-06: 10 mg via ORAL
  Filled 2023-09-05 (×2): qty 1

## 2023-09-05 MED ORDER — FUROSEMIDE 10 MG/ML IJ SOLN
80.0000 mg | Freq: Once | INTRAMUSCULAR | Status: AC
  Administered 2023-09-05: 80 mg via INTRAVENOUS
  Filled 2023-09-05: qty 8

## 2023-09-05 MED ORDER — ONDANSETRON HCL 4 MG PO TABS: 4.0000 mg | ORAL_TABLET | Freq: Four times a day (QID) | ORAL | Status: DC | PRN

## 2023-09-05 MED ORDER — ACETAMINOPHEN 650 MG RE SUPP: 650.0000 mg | Freq: Four times a day (QID) | RECTAL | Status: DC | PRN

## 2023-09-05 MED ORDER — ATORVASTATIN CALCIUM 40 MG PO TABS
40.0000 mg | ORAL_TABLET | Freq: Every day | ORAL | Status: DC
  Administered 2023-09-05: 40 mg via ORAL
  Filled 2023-09-05: qty 1

## 2023-09-05 MED ORDER — CHLORHEXIDINE GLUCONATE CLOTH 2 % EX PADS
6.0000 | MEDICATED_PAD | Freq: Every day | CUTANEOUS | Status: DC
  Administered 2023-09-05 – 2023-09-14 (×8): 6 via TOPICAL

## 2023-09-05 MED ORDER — ACETAMINOPHEN 325 MG PO TABS
650.0000 mg | ORAL_TABLET | Freq: Four times a day (QID) | ORAL | Status: DC | PRN
  Administered 2023-09-11 – 2023-09-13 (×3): 650 mg via ORAL
  Filled 2023-09-05 (×3): qty 2

## 2023-09-05 NOTE — ED Triage Notes (Signed)
 Pt arrived via POV with family from home c/o chest pain X 2 days. Pt endorses nausea, and SOB. Pt also reports recently finishing treatment for lymphoma with Dr MARLA.

## 2023-09-05 NOTE — H&P (Signed)
 History and Physical    Patient: Leslie Williamson FMW:[id number] DOB: 1931/11/18 DOA: 09/05/2023 DOS: the patient was seen and examined on 09/05/2023 PCP: Carlette Benita Area, MD  Patient coming from: Home  Chief Complaint:  Chief Complaint  Patient presents with   Chest Pain   HPI: Leslie Williamson is a 89 y.o. female with medical history significant for chronic diastolic dysfunction CHF, HTN, small B-cell lymphoma of the spleen/non-Hodgkin's lymphoma, PAD, essential thrombocytosis, JAK2 plus myelo proliferative disorder, MGUS and GERD Presents with son Leslie Williamson at bedside with complaints of chest discomfort increasing shortness of breath and nausea for the last couple days patient apparently was dealing with URI symptoms-she was given promethazine for URI symptoms and cough patient became very sedated from the promethazine for the last couple days so she stopped taking all her medications including cardiac meds No fever  Or chills   No Nausea, Vomiting or Diarrhea  -Additional history obtained from patient's son Leslie Williamson at bedside  -In the ED-- CT chest abdomen and pelvis with moderate right and small left pleural effusions with passive atelectasis, moderate cardiomegaly, otherwise no acute changes COVID, flu and RSV negative Troponin 261 repeat 272---EKG with sinus rhythm no acute findings -- BNP greater than 4500 - Creatinine is up to 1.91 it was 1.14 on 08/18/2023 -AST 70 ALT 111 alk phos 170 T. bili 1.4 ,LFTs were previously normal - WBC 5.6 hemoglobin 9.0 similar to priors low MCV and MCH noted platelets 363  Problem  Acute Exacerbation of Chf (Congestive Heart Failure) (Hcc)  Malignant Neoplasm of Lymphoid, Hematopoietic and Related Tissue, Unspecified (Hcc)  HFrEF  Essential Thrombocytosis (Hcc)  Mgus (Monoclonal Gammopathy of Unknown Significance)  Nhl (Non-Hodgkin's Lymphoma) (Hcc)  GERD  JAK2+ MDP (myeloproliferative disorder) presenting with thrombocytosis (HCC)  PVD-  know Lt SFA disease, bilat PT and AT disease   Review of Systems: As mentioned in the history of present illness. All other systems reviewed and are negative. 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   Vertigo, labyrinthine    Past Surgical History:  Procedure Laterality Date   CATARACT EXTRACTION W/PHACO Left 11/03/2015   Procedure: CATARACT EXTRACTION PHACO AND INTRAOCULAR LENS PLACEMENT (IOC);  Surgeon: Cherene Mania, MD;  Location: [facility];  Service: Ophthalmology;  Laterality: Left;  CDE: 7.74   CATARACT EXTRACTION W/PHACO Right 11/20/2015   Procedure: CATARACT EXTRACTION PHACO AND INTRAOCULAR LENS PLACEMENT ; CDE:  8.30;  Surgeon: Cherene Mania, MD;  Location: [facility];  Service: Ophthalmology;  Laterality: Right;   IR IMAGING GUIDED PORT INSERTION  11/04/2021   MASS EXCISION Right 02/18/2023   Procedure: Incisional Biopsy Right Lower Extremity;  Surgeon: Kallie Manuelita BROCKS, MD;  Location: [facility];  Service: General;  Laterality: Right;   PORT-A-CATH REMOVAL     Social History:  reports  that she has quit smoking. She has never used smokeless tobacco. She reports that she does not drink alcohol  and does not use drugs.  Allergies  Allergen Reactions   Penicillins Rash    Family History  Problem Relation Age of Onset   Diabetes Mother     Prior to Admission medications   Medication Sig Start Date  End Date Taking? Authorizing Provider  acyclovir  (ZOVIRAX ) 400 MG tablet Take 1 tablet (400 mg total) by mouth 2 (two) times daily. 12/30/21  Yes Rogers Hai, MD  apixaban  (ELIQUIS ) 5 MG TABS tablet Take 1 tablet (5 mg total) by mouth 2 (two) times daily. 08/05/22  Yes Krishnan, Gokul, MD  atorvastatin  (LIPITOR) 40 MG tablet Take 40 mg by mouth at bedtime. 11/29/22  Yes [provider]  dapagliflozin  propanediol (FARXIGA ) 10 MG TABS tablet Take 1 tablet (10 mg total) by mouth daily. 06/05/22  Yes Jillian Buttery, MD  allopurinol  (ZYLOPRIM ) 300 MG tablet Take 1 tablet (300 mg total) by mouth daily. 03/21/23   Rogers Hai, MD  lenalidomide  (REVLIMID ) 10 MG capsule Take 1 capsule (10 mg total) by mouth daily. Take for 21 days on, 7 days off. 03/10/23   Rogers Hai, MD    Physical Exam: Vitals:   09/05/23 1600 09/05/23 1700 09/05/23 1715 09/05/23 1800  BP: 113/80 110/75 109/78 116/82  Pulse: 98 100 94 90  Resp: (!) 22 17 (!) 22 20  Temp:      TempSrc:      SpO2: 96% 99% 99% 95%  Weight:      Height:        Physical Exam Gen:- Awake Alert, in no acute distress  HEENT:- [place].AT, No sclera icterus Neck-Supple Neck, +ve JVD,.  Lungs-diminished breath sounds right more than left with bibasilar rales  CV- S1, S2 normal, RRR, Lt chest Port-A-Cath in situ Abd-  +ve B.Sounds, Abd Soft, No tenderness,    Extremity/Skin:-  2+ve  edema,   good pedal pulses  Psych-affect is appropriate, oriented x3 Neuro-generalized weakness, no new focal deficits, no tremors  Data Reviewed: -In the ED-- CT chest abdomen and pelvis with moderate right and small left pleural effusions with passive atelectasis, moderate cardiomegaly, otherwise no acute changes COVID, flu and RSV negative Troponin 261 repeat 272---EKG with sinus rhythm no acute findings -- BNP greater than 4500 - Creatinine is up to 1.91 it was 1.14 on 08/18/2023 - WBC 5.6 hemoglobin 9.0 similar to priors low MCV and MCH  noted platelets 363  Assessment and Plan: 1) acute on chronic diastolic dysfunction CHF--- due to missed medication doses - Echo from January 2025 with EF of 55% with grade 1 diastolic function - CT chest abdomen and pelvis with moderate right and small left pleural effusions with passive atelectasis, moderate cardiomegaly, --- BNP greater than 4500 - IV Lasix  - Daily weight, fluid input and output monitoring, REDs Vest   2)Type 2 NSTEMI--- Troponin 261 repeat 272---EKG with sinus rhythm no acute findings--- not consistent with ACS - Suspect due to demand ischemia in the setting of #1 above  3)AKI----acute kidney injury on CKD stage - 3A -- Creatinine is up to 1.91 it was 1.14 on 08/18/2023 -Monitor renal function closely with diuresis ---renally adjust medications, avoid nephrotoxic agents / dehydration  / hypotension  4)Acute  transaminitis--- suspect due to hepatic congestion in setting of #1 above -AST 70 ALT 111 alk phos 170 T. bili 1.4 ,LFTs were previously normal - Monitor closely  5) chronic anemia----hemoglobin 9.0  similar to priors low MCV and MCH noted platelets 363 - No bleeding concerns  6)PAD--- continue apixaban  and atorvastatin   7)small B-cell lymphoma of the spleen/non-Hodgkin's lymphoma, PAD, essential thrombocytosis, JAK2 plus myelo proliferative disorder, MGUS --- currently undergoing treatment with Dr. Rogers    Advance Care Planning:   Code Status: Limited: Do not attempt resuscitation (DNR) -DNR-LIMITED -Do Not Intubate/DNI     Family Communication: Discussed with son John at bedside  Severity of Illness: The appropriate patient status for this patient is INPATIENT. Inpatient status is judged to be reasonable and necessary in order to provide the required intensity of service to ensure the patient's safety. The patient's presenting symptoms, physical exam findings, and initial radiographic and laboratory data in the context of their chronic comorbidities  is felt to place them at high risk for further clinical deterioration. Furthermore, it is not anticipated that the patient will be medically stable for discharge from the hospital within 2 midnights of admission.   * I certify that at the point of admission it is my clinical judgment that the patient will require inpatient hospital care spanning beyond 2 midnights from the point of admission due to high intensity of service, high risk for further deterioration and high frequency of surveillance required.*  Author: Rendall Carwin, MD 09/05/2023 7:09 PM  For on call review [email or web address].

## 2023-09-05 NOTE — ED Provider Notes (Signed)
  Physical Exam  BP 112/70   Pulse (!) 102   Temp 98.6 F (37 C) (Oral)   Resp (!) 28   Ht 5' 5 (1.651 m)   Wt 57.2 kg   SpO2 94%   BMI 20.98 kg/m   Physical Exam  Procedures  Procedures  ED Course / MDM    Medical Decision Making Amount and/or Complexity of Data Reviewed Labs: ordered. Radiology: ordered.  Risk Prescription drug management. Decision regarding hospitalization.   [name] presenting with a cough, had been Rx'd Promethazine, then seemed somnolent, stopped all of her medications. Presenting volume overloaded, elevated trop and BNP, AKI and elevated LFTs, concern for multisystem organ failure in the setting of hypervolemia, needs admission. Pending CT imaging prior to admit. Stable.  Patient evaluated bedside, resting comfortably, afebrile, not tachycardic or tachypneic, hemodynamically stable.  She has been administered 80 mg of IV Lasix .  Discussed plan for admission with family bedside.  CT C/A/P:  IMPRESSION:  1. Moderate right and small left pleural effusions with associated  passive atelectasis.  2. Moderate cardiomegaly.  3. Small left adrenal adenoma unchanged.  4. Colonic diverticulosis.  5. 7 mm degenerative anterolisthesis at L3-4. Multilevel lumbar  degenerative facet arthropathy.  6.  Aortic Atherosclerosis (ICD10-I70.0).   Hospitalist medicine consulted for admission for further management of likely CHF exacerbation, Dr. Pearlean accepting.      Jerrol Agent, MD 09/05/23 [phone]

## 2023-09-05 NOTE — Telephone Encounter (Signed)
 FYI

## 2023-09-05 NOTE — Telephone Encounter (Signed)
 See other telephone encounter.

## 2023-09-05 NOTE — Telephone Encounter (Signed)
 Clearance sent and received by. Sent to [employer]

## 2023-09-05 NOTE — ED Notes (Signed)
 Troponin 261. Dr. Melvenia informed via epic secure chat.

## 2023-09-05 NOTE — ED Provider Notes (Signed)
 [place] EMERGENCY DEPARTMENT AT [facility] Provider Note   CSN: [id number] Arrival date & time: 09/05/23  1307     Patient presents with: Chest Pain   Leslie Williamson is a 89 y.o. female.    Chest Pain Associated symptoms: fatigue, shortness of breath and weakness (Generalized)   Patient presents for chest pain.  Medical history includes lymphoma, CHF, HTN, anemia, PVD, anxiety, depression, PE, multiple myeloma.  Prescribed medications include Eliquis , Lasix .  She underwent recent PET scan which showed hypermetabolic activity in fundus of stomach.  She is seen by GI 10 days ago.  Plan was for EGD.  1 week ago, patient developed cough and congestion.  She was prescribed prednisone  and promethazine cough medicine.  The promethazine had a sedating effect on her.  She had worsened generalized weakness.  Because of this, she stopped taking all medications over the past several days.  Although she seemed to be doing better yesterday, she had worsened generalized weakness today.  She had chest discomfort and shortness of breath.  She also endorses soreness in her thighs.  Currently, leg pain and chest pain have resolved.  She denies any current shortness of breath or pleurisy.     Prior to Admission medications   Medication Sig Start Date End Date Taking? Authorizing Provider  acetaminophen  (TYLENOL ) 500 MG tablet Take 1,000 mg by mouth every 6 (six) hours as needed for moderate pain.    [provider]  acyclovir  (ZOVIRAX ) 400 MG tablet Take 1 tablet (400 mg total) by mouth 2 (two) times daily. 12/30/21   Rogers Hai, MD  allopurinol  (ZYLOPRIM ) 300 MG tablet Take 1 tablet (300 mg total) by mouth daily. 03/21/23   Rogers Hai, MD  apixaban  (ELIQUIS ) 5 MG TABS tablet Take 1 tablet (5 mg total) by mouth 2 (two) times daily. 08/05/22   Krishnan, Gokul, MD  atorvastatin  (LIPITOR) 40 MG tablet Take 40 mg by mouth at bedtime. 11/29/22   [provider]   CYCLOPHOSPHAMIDE  IV Inject into the vein every 21 ( twenty-one) days. Patient not taking: Reported on 08/25/2023 03/29/23   [provider]  dapagliflozin  propanediol (FARXIGA ) 10 MG TABS tablet Take 1 tablet (10 mg total) by mouth daily. 06/05/22   Jillian Buttery, MD  dexamethasone  (DECADRON ) 4 MG tablet Take 10 mg (2.5 tablets) by mouth weekly every Wednesday morning Per note by Dr. Rogers on 09/15/2022 Patient not taking: Reported on 08/25/2023 12/24/22   Rogers Hai, MD  DOXOrubicin  HCl (ADRIAMYCIN  IV) Inject into the vein every 21 ( twenty-one) days. Patient not taking: Reported on 08/25/2023 03/29/23   [provider]  furosemide  (LASIX ) 40 MG tablet Take 40 mg by mouth daily. 05/24/23   [provider]  lenalidomide  (REVLIMID ) 10 MG capsule Take 1 capsule (10 mg total) by mouth daily. Take for 21 days on, 7 days off. 03/10/23   Rogers Hai, MD  lidocaine -prilocaine  (EMLA ) cream Apply 1 Application topically as needed. 09/17/22   Lamon Pleasant HERO, PA-C  lidocaine -prilocaine  (EMLA ) cream Apply to affected area once 03/21/23   Katragadda, Sreedhar, MD  potassium chloride  SA (KLOR-CON  M) 20 MEQ tablet TAKE TWO TABLETS BY MOUTH DAILY 06/23/23   Rogers Hai, MD  riTUXimab  (RITUXAN  IV) Inject into the vein every 21 ( twenty-one) days. Patient not taking: Reported on 08/25/2023 03/29/23   [provider]  VINCRISTINE  SULFATE IV Inject into the vein every 21 ( twenty-one) days. Patient not taking: Reported on 08/25/2023 03/29/23   [provider]    Allergies: Penicillins    Review of Systems  Constitutional:  Positive for fatigue.  Respiratory:  Positive for shortness of breath.   Cardiovascular:  Positive for chest pain and leg swelling.  Musculoskeletal:  Positive for myalgias.  Neurological:  Positive for weakness (Generalized).  All other systems reviewed and are negative.   Updated Vital Signs BP 112/70   Pulse (!) 102   Temp  98.6 F (37 C) (Oral)   Resp (!) 28   Ht 5' 5 (1.651 m)   Wt 57.2 kg   SpO2 94%   BMI 20.98 kg/m   Physical Exam Vitals and nursing note reviewed.  Constitutional:      General: She is not in acute distress.    Appearance: She is well-developed. She is not toxic-appearing or diaphoretic.  HENT:     Head: Normocephalic and atraumatic.  Eyes:     Conjunctiva/sclera: Conjunctivae normal.  Cardiovascular:     Rate and Rhythm: Normal rate and regular rhythm.     Heart sounds: No murmur heard. Pulmonary:     Effort: Pulmonary effort is normal. No respiratory distress.     Breath sounds: Examination of the right-lower field reveals decreased breath sounds. Examination of the left-lower field reveals decreased breath sounds. Decreased breath sounds present. No wheezing, rhonchi or rales.  Chest:     Chest wall: No tenderness.  Abdominal:     Palpations: Abdomen is soft.     Tenderness: There is no abdominal tenderness.  Musculoskeletal:        General: No swelling.     Cervical back: Normal range of motion and neck supple.     Right lower leg: Edema present.     Left lower leg: Edema present.  Skin:    General: Skin is warm and dry.     Coloration: Skin is not cyanotic or pale.  Neurological:     General: No focal deficit present.     Mental Status: She is alert and oriented to person, place, and time.  Psychiatric:        Mood and Affect: Mood normal.        Behavior: Behavior normal.     (all labs ordered are listed, but only abnormal results are displayed) Labs Reviewed  CBC WITH DIFFERENTIAL/PLATELET - Abnormal; Notable for the following components:      Result Value   RBC 3.78 (*)    Hemoglobin 9.0 (*)    HCT 28.3 (*)    MCV 74.9 (*)    MCH 23.8 (*)    RDW 19.6 (*)    nRBC 4.5 (*)    Monocytes Absolute 1.2 (*)    All other components within normal limits  COMPREHENSIVE METABOLIC PANEL WITH GFR - Abnormal; Notable for the following components:   CO2 16 (*)     Glucose, Bld 115 (*)    BUN 53 (*)    Creatinine, Ser 1.91 (*)    Calcium  8.7 (*)    Total Protein 5.5 (*)    Albumin 3.3 (*)    AST 70 (*)    ALT 111 (*)    Alkaline Phosphatase 170 (*)    Total Bilirubin 1.4 (*)    GFR, Estimated 25 (*)    Anion gap 16 (*)    All other components within normal limits  BRAIN NATRIURETIC PEPTIDE - Abnormal; Notable for the following components:   B Natriuretic Peptide >4,500.0 (*)    All other components within normal  limits  BLOOD GAS, VENOUS - Abnormal; Notable for the following components:   pCO2, Ven 33 (*)    pO2, Ven <31 (*)    Bicarbonate 16.6 (*)    Acid-base deficit 8.6 (*)    All other components within normal limits  TROPONIN I (HIGH SENSITIVITY) - Abnormal; Notable for the following components:   Troponin I (High Sensitivity) 261 (*)    All other components within normal limits  RESP PANEL BY RT-PCR (RSV, FLU A&B, COVID)  RVPGX2  MAGNESIUM   URINALYSIS, ROUTINE W REFLEX MICROSCOPIC  TROPONIN I (HIGH SENSITIVITY)    EKG: EKG Interpretation Date/Time:  Monday September 05 2023 13:17:57 EDT Ventricular Rate:  104 PR Interval:  158 QRS Duration:  104 QT Interval:  384 QTC Calculation: 504 R Axis:   269  Text Interpretation: Sinus tachycardia Right superior axis deviation Anterior infarct (cited on or before 02-Jul-2022) Abnormal ECG Confirmed by Melvenia Motto [phone]) on 09/05/2023 1:33:42 PM  Radiology: [facility] Chest 2 View Result Date: 09/05/2023 CLINICAL DATA:  Chest pain history of B-cell lymphoma EXAM: CHEST - 2 VIEW COMPARISON:  X-ray 05/24/2023. FINDINGS: Underinflation. Enlarged cardiopericardial silhouette with calcified aorta. New small pleural effusions adjacent lung opacities. Recommend follow-up. No pneumothorax or edema. Right IJ chest port with tip overlying the upper right atrium. Overlapping cardiac leads. Surgical clips in the upper abdomen. Diffuse degenerative changes. IMPRESSION: Underinflation with small pleural effusions and  adjacent opacities. Recommend follow-up. Chest port. Enlarged cardiopericardial silhouette. Electronically Signed   By: Ranell Bring M.D.   On: 09/05/2023 13:49     Procedures   Medications Ordered in the ED  furosemide  (LASIX ) injection 80 mg (has no administration in time range)                                    Medical Decision Making Amount and/or Complexity of Data Reviewed Labs: ordered. Radiology: ordered.   This patient presents to the ED for concern of generalized weakness, this involves an extensive number of treatment options, and is a complaint that carries with it a high risk of complications and morbidity.  The differential diagnosis includes worsening of cancer, dehydration, infection, metabolic derangements, deconditioning, anemia, ACS, arrhythmia   Co morbidities / Chronic conditions that complicate the patient evaluation  lymphoma, CHF, HTN, anemia, PVD, anxiety, depression, PE, multiple myeloma   Additional history obtained:  Additional history obtained from EMR External records from outside source obtained and reviewed including patient's sons   Lab Tests:  I Ordered, and personally interpreted labs.  The pertinent results include: Elevated troponin and markedly elevated BNP consistent with CHF exacerbation; AKI is present also present, raising concern of cardiorenal syndrome.   Imaging Studies ordered:  I ordered imaging studies including chest x-ray, CT of chest, abdomen, pelvis small pleural effusions with adjacent opacities in addition to enlarged cardiac silhouette, consistent with CHF.  CT pending at time of signout. I independently visualized and interpreted imaging which showed chest x-ray shows I agree with the radiologist interpretation   Cardiac Monitoring: / EKG:  The patient was maintained on a cardiac monitor.  I personally viewed and interpreted the cardiac monitored which showed an underlying rhythm of: Sinus rhythm   Problem List  / ED Course / Critical interventions / Medication management  Patient presenting for generalized weakness in addition to transient chest pain and shortness of breath earlier today.  Vital signs on arrival notable for  mild tachycardia.  On exam, patient is overall well-appearing.  She has pitting edema in lower extremities which she states is baseline.  Current breathing is unlabored.  She is able to speak in complete sentences.  She describes a central chest pain that occurred earlier today but has since resolved.  On auscultation, she has some bibasilar diminished breath sounds.  Of note, she has been off of her daily Lasix  for the past several days.  Chest x-ray shows pleural effusions with adjacent opacities.  CT ordered to further evaluate.  Patient's lab work concerning for cardiorenal syndrome.  IV Lasix  was ordered..  Patient was signed out to oncoming ED provider. I ordered medication including Lasix  for diuresis Reevaluation of the patient after these medicines showed that the patient has not yet received I have reviewed the patients home medicines and have made adjustments as needed   Social Determinants of Health:  Lives at home with family     Final diagnoses:  Chest pain, unspecified type  Acute on chronic congestive heart failure, unspecified heart failure type [facility])  AKI (acute kidney injury) [facility])    ED Discharge Orders     None          Melvenia Motto, MD 09/05/23 1531

## 2023-09-05 NOTE — ED Notes (Signed)
 Patient transported to CT

## 2023-09-05 NOTE — Telephone Encounter (Signed)
 Thanks, please schedule him and provide him the instructions regarding anticoagulation

## 2023-09-06 ENCOUNTER — Other Ambulatory Visit (HOSPITAL_COMMUNITY): Payer: Self-pay | Admitting: *Deleted

## 2023-09-06 ENCOUNTER — Other Ambulatory Visit (HOSPITAL_COMMUNITY)

## 2023-09-06 ENCOUNTER — Inpatient Hospital Stay (HOSPITAL_COMMUNITY)

## 2023-09-06 ENCOUNTER — Encounter (HOSPITAL_COMMUNITY): Payer: Self-pay | Admitting: Family Medicine

## 2023-09-06 DIAGNOSIS — Z515 Encounter for palliative care: Secondary | ICD-10-CM | POA: Diagnosis not present

## 2023-09-06 DIAGNOSIS — R7989 Other specified abnormal findings of blood chemistry: Secondary | ICD-10-CM | POA: Diagnosis not present

## 2023-09-06 DIAGNOSIS — I5033 Acute on chronic diastolic (congestive) heart failure: Secondary | ICD-10-CM | POA: Diagnosis not present

## 2023-09-06 DIAGNOSIS — I50813 Acute on chronic right heart failure: Secondary | ICD-10-CM

## 2023-09-06 DIAGNOSIS — R0609 Other forms of dyspnea: Secondary | ICD-10-CM | POA: Diagnosis not present

## 2023-09-06 DIAGNOSIS — N179 Acute kidney failure, unspecified: Secondary | ICD-10-CM | POA: Diagnosis not present

## 2023-09-06 DIAGNOSIS — Z7189 Other specified counseling: Secondary | ICD-10-CM | POA: Diagnosis not present

## 2023-09-06 DIAGNOSIS — I2129 ST elevation (STEMI) myocardial infarction involving other sites: Secondary | ICD-10-CM

## 2023-09-06 LAB — COOXEMETRY PANEL
Carboxyhemoglobin: 0.3 % — ABNORMAL LOW (ref 0.5–1.5)
Methemoglobin: 0.9 % (ref 0.0–1.5)
O2 Saturation: 28.3 %
Total hemoglobin: 9.1 g/dL — ABNORMAL LOW (ref 12.0–16.0)

## 2023-09-06 LAB — URINALYSIS, ROUTINE W REFLEX MICROSCOPIC
Bilirubin Urine: NEGATIVE
Glucose, UA: 150 mg/dL — AB
Hgb urine dipstick: NEGATIVE
Ketones, ur: NEGATIVE mg/dL
Leukocytes,Ua: NEGATIVE
Nitrite: NEGATIVE
Protein, ur: NEGATIVE mg/dL
Specific Gravity, Urine: 1.01 (ref 1.005–1.030)
pH: 5 (ref 5.0–8.0)

## 2023-09-06 LAB — CBC
HCT: 26.7 % — ABNORMAL LOW (ref 36.0–46.0)
Hemoglobin: 8.2 g/dL — ABNORMAL LOW (ref 12.0–15.0)
MCH: 22.9 pg — ABNORMAL LOW (ref 26.0–34.0)
MCHC: 30.7 g/dL (ref 30.0–36.0)
MCV: 74.6 fL — ABNORMAL LOW (ref 80.0–100.0)
Platelets: 328 K/uL (ref 150–400)
RBC: 3.58 MIL/uL — ABNORMAL LOW (ref 3.87–5.11)
RDW: 19.2 % — ABNORMAL HIGH (ref 11.5–15.5)
WBC: 4.8 K/uL (ref 4.0–10.5)
nRBC: 7.1 % — ABNORMAL HIGH (ref 0.0–0.2)

## 2023-09-06 LAB — HEPATIC FUNCTION PANEL
ALT: 118 U/L — ABNORMAL HIGH (ref 0–44)
AST: 70 U/L — ABNORMAL HIGH (ref 15–41)
Albumin: 3.1 g/dL — ABNORMAL LOW (ref 3.5–5.0)
Alkaline Phosphatase: 155 U/L — ABNORMAL HIGH (ref 38–126)
Bilirubin, Direct: 0.6 mg/dL — ABNORMAL HIGH (ref 0.0–0.2)
Indirect Bilirubin: 1 mg/dL — ABNORMAL HIGH (ref 0.3–0.9)
Total Bilirubin: 1.6 mg/dL — ABNORMAL HIGH (ref 0.0–1.2)
Total Protein: 4.9 g/dL — ABNORMAL LOW (ref 6.5–8.1)

## 2023-09-06 LAB — RENAL FUNCTION PANEL
Albumin: 3 g/dL — ABNORMAL LOW (ref 3.5–5.0)
Anion gap: 10 (ref 5–15)
BUN: 55 mg/dL — ABNORMAL HIGH (ref 8–23)
CO2: 20 mmol/L — ABNORMAL LOW (ref 22–32)
Calcium: 8.3 mg/dL — ABNORMAL LOW (ref 8.9–10.3)
Chloride: 113 mmol/L — ABNORMAL HIGH (ref 98–111)
Creatinine, Ser: 1.73 mg/dL — ABNORMAL HIGH (ref 0.44–1.00)
GFR, Estimated: 28 mL/min — ABNORMAL LOW (ref >60)
Glucose, Bld: 91 mg/dL (ref 70–99)
Phosphorus: 4.5 mg/dL (ref 2.5–4.6)
Potassium: 3.7 mmol/L (ref 3.5–5.1)
Sodium: 143 mmol/L (ref 135–145)

## 2023-09-06 LAB — ECHOCARDIOGRAM LIMITED
Area-P 1/2: 5.27 cm2
Est EF: 20
Height: 65 in
S' Lateral: 4.6 cm
Weight: 2045.87 [oz_av]

## 2023-09-06 LAB — MRSA NEXT GEN BY PCR, NASAL: MRSA by PCR Next Gen: NOT DETECTED

## 2023-09-06 LAB — LACTIC ACID, PLASMA: Lactic Acid, Venous: 3.2 mmol/L (ref 0.5–1.9)

## 2023-09-06 MED ORDER — APIXABAN 5 MG PO TABS
5.0000 mg | ORAL_TABLET | Freq: Two times a day (BID) | ORAL | Status: DC
  Administered 2023-09-07 – 2023-09-14 (×14): 5 mg via ORAL
  Filled 2023-09-06 (×14): qty 1

## 2023-09-06 MED ORDER — MILRINONE LACTATE IN DEXTROSE 20-5 MG/100ML-% IV SOLN
0.1250 ug/kg/min | INTRAVENOUS | Status: DC
  Administered 2023-09-06: 0.125 ug/kg/min via INTRAVENOUS
  Administered 2023-09-07 – 2023-09-08 (×2): 0.375 ug/kg/min via INTRAVENOUS
  Administered 2023-09-09 – 2023-09-11 (×4): 0.25 ug/kg/min via INTRAVENOUS
  Administered 2023-09-12: 0.125 ug/kg/min via INTRAVENOUS
  Filled 2023-09-06 (×7): qty 100

## 2023-09-06 MED ORDER — PERFLUTREN LIPID MICROSPHERE
1.0000 mL | INTRAVENOUS | Status: AC | PRN
  Administered 2023-09-06: 3 mL via INTRAVENOUS

## 2023-09-06 MED ORDER — GUAIFENESIN-DM 100-10 MG/5ML PO SYRP
5.0000 mL | ORAL_SOLUTION | ORAL | Status: DC | PRN
  Administered 2023-09-06: 5 mL via ORAL
  Filled 2023-09-06: qty 5

## 2023-09-06 NOTE — Consult Note (Addendum)
 Consultation Note Date: 09/06/2023   Patient Name: Leslie Williamson  DOB: October 18, 1931  MRN: [id number]  Age / Sex: 89 y.o., female  PCP: Carlette Benita Area, MD Referring Physician: Willette Adriana LABOR, MD  Reason for Consultation: Establishing goals of care  HPI/Patient Profile: 89 y.o. 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   Clinical Assessment and Goals of Care: I have reviewed medical records including EPIC notes, labs and imaging, received report from RN, assessed the patient.  Mrs. Escajeda is lying quietly in bed.  She appears chronically ill and somewhat frail, elderly.  She greets me, making and somewhat keeping eye contact.  She is alert and oriented x 3, able to make her needs known.  Her son, [name], is present at bedside.    We meet at the bedside to discuss diagnosis prognosis, GOC, EOL wishes, disposition and options.  I introduced Palliative Medicine as specialized medical care for people living with serious illness. It focuses on providing relief from the symptoms and stress of a serious illness. The goal is to improve quality of life for both the patient and the family.  We discussed a brief life review of the patient.  Mrs. Darden is a widow.  Her son, [name], lives in the family home.  She is able to do her own bathing and dressing, but [name] gets groceries and cooks food.  Both John and her other son, Lynwood, help out around the house with chores.  We then focused on their current illness.  We talked about her heart failure exacerbation and the treatment plan.  I shared that the heart needs to stay dry but the kidneys need to stay wet.  We talked about time  for outcomes.  She tells me that she did see the cardiologist this morning, but was unable to  repeat the treatment plan.  Mrs. Wence shares that she would want to return to her own home if at all possible.  The natural disease trajectory and expectations at EOL were discussed.  Advanced directives, concepts specific to code status, were considered and discussed.  Mrs. Dover endorses DNR.  Discussed the importance of continued conversation with family and the medical providers regarding overall plan of care and treatment options, ensuring decisions are within the context of the patient's values and GOCs.  Questions and concerns were addressed.  The family was encouraged to call with questions or concerns.  PMT will continue to support holistically.  Conference with attending, bedside nursing staff, transition of care team related to patient condition, needs, goals of care, disposition.  Addendum: We also talked about Mrs. Wanek's cancer treatment plan and her follow-up with Dr. Katragadda.  She tells me that she is to follow-up July 29 and her multiple myeloma treatment is being held until she is treated for lymphoma.   HCPOA  NEXT  OF KIN -sons Lynwood and [name] as a team.  Lynwood as primary.    SUMMARY OF RECOMMENDATIONS   Continue to treat the treatable but no CPR or intubation Time for outcomes Prefers home if possible   Code Status/Advance Care Planning: DNR  Symptom Management:  Per hospitalist, no additional needs at this time.  Palliative Prophylaxis:  Frequent Pain Assessment and Oral Care  Additional Recommendations (Limitations, Scope, Preferences): Continue to treat but no CPR or intubation  Psycho-social/Spiritual:  Desire for further Chaplaincy support:no Additional Recommendations: Caregiving  Support/Resources  Prognosis:  Unable to determine, based on outcomes.  1 year or less would not be surprising based on advanced age and chronic illness burden.    Discharge Planning:  Anticipate home with home health if needed/qualified.      Primary Diagnoses: Present on Admission:  PVD- know Lt SFA disease, bilat PT and AT disease  NHL (non-Hodgkin's lymphoma) (HCC)  MGUS (monoclonal gammopathy of unknown significance)  Malignant neoplasm of lymphoid, hematopoietic and related tissue, unspecified (HCC)  JAK2+ MDP (myeloproliferative disorder) presenting with thrombocytosis (HCC)  HFrEF  Essential thrombocytosis (HCC)  GERD   I have reviewed the medical record, interviewed the patient and family, and examined the patient. The following aspects are pertinent.  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   Vertigo, labyrinthine    Social History   Socioeconomic History   Marital status: Widowed    Spouse name: Not on file   Number of children: Not on file   Years of education: Not on file   Highest education level: Not on file  Occupational History   Not on file  Tobacco Use   Smoking status: Former   Smokeless tobacco: Never  Vaping Use   Vaping status: Never Used  Substance and Sexual Activity   Alcohol  use: No   Drug use: No   Sexual activity: Not Currently  Other Topics Concern   Not on file  Social History Narrative   Not on file   Social Drivers of Health   Financial Resource Strain: Low Risk  (12/31/2019)   Overall Financial  Resource Strain (CARDIA)    Difficulty of Paying Living Expenses: Not hard at all  Food Insecurity: No Food Insecurity (09/05/2023)   Hunger Vital Sign    Worried About Running Out of Food in the Last Year: Never true    Ran Out of Food in the Last Year: Never true  Transportation Needs: No Transportation Needs (09/05/2023)   PRAPARE - Administrator, Civil Service (Medical): No    Lack of Transportation (Non-Medical): No  Physical Activity: Inactive (12/31/2019)   Exercise Vital Sign    Days of Exercise per Week: 0 days    Minutes of Exercise per Session: 0 min  Stress: No Stress Concern  Present (12/31/2019)   [employer] of Occupational Health - Occupational Stress Questionnaire    Feeling of Stress : Not at all  Social Connections: Moderately Isolated (09/05/2023)   Social Connection and Isolation Panel    Frequency of Communication with Friends and Family: Once a week    Frequency of Social Gatherings with Friends and Family: Once a week    Attends Religious Services: 1 to 4 times per year    Active Member of [employer] or Organizations: No    Attends Banker Meetings: 1 to 4 times per year    Marital Status: Widowed   Family History  Problem Relation Age of Onset   Diabetes Mother    Scheduled Meds:  acyclovir   400 mg Oral BID   allopurinol   300 mg Oral Daily   [START ON 09/07/2023] apixaban   5 mg Oral BID   Chlorhexidine  Gluconate Cloth  6 each Topical Daily   dapagliflozin  propanediol  10 mg Oral Daily   furosemide   40 mg Intravenous BID   lenalidomide   10 mg Oral Daily   sodium chloride  flush  3 mL Intravenous Q12H   Continuous Infusions:  sodium chloride      PRN Meds:.sodium chloride , acetaminophen  **OR** acetaminophen , bisacodyl , ondansetron  **OR** ondansetron  (ZOFRAN ) IV, polyethylene glycol, sodium chloride  flush, traZODone  Medications Prior to Admission:  Prior to Admission medications   Medication Sig Start Date End Date Taking?  Authorizing Provider  acyclovir  (ZOVIRAX ) 400 MG tablet Take 1 tablet (400 mg total) by mouth 2 (two) times daily. 12/30/21  Yes Rogers Hai, MD  apixaban  (ELIQUIS ) 5 MG TABS tablet Take 1 tablet (5 mg total) by mouth 2 (two) times daily. 08/05/22  Yes Krishnan, Gokul, MD  atorvastatin  (LIPITOR) 40 MG tablet Take 40 mg by mouth at bedtime. 11/29/22  Yes [provider]  dapagliflozin  propanediol (FARXIGA ) 10 MG TABS tablet Take 1 tablet (10 mg total) by mouth daily. 06/05/22  Yes Jillian Buttery, MD  furosemide  (LASIX ) 40 MG tablet Take 40 mg by mouth daily.   Yes [provider]  metoprolol  succinate (TOPROL -XL) 50 MG 24 hr tablet Take 25 mg by mouth daily. Take with or immediately following a meal.   Yes [provider]  potassium chloride  SA (KLOR-CON  M) 20 MEQ tablet Take 20 mEq by mouth daily.   Yes [provider]  promethazine-dextromethorphan  (PROMETHAZINE-DM) 6.25-15 MG/5ML syrup Take 5 mLs by mouth 4 (four) times daily as needed for cough.   Yes [provider]  lenalidomide  (REVLIMID ) 10 MG capsule Take 1 capsule (10 mg total) by mouth daily. Take for 21 days on, 7 days off. Patient not taking: Reported on 09/05/2023 03/10/23   Rogers Hai, MD   Allergies  Allergen Reactions   Penicillins Rash   Review of Systems  Unable to perform ROS: Age    Physical Exam Vitals and nursing note reviewed.  Constitutional:      General: She is not in acute distress.    Appearance: She is ill-appearing.  Cardiovascular:     Rate and Rhythm: Normal rate.  Pulmonary:     Effort: Pulmonary effort is normal. No tachypnea.  Musculoskeletal:     Comments: Mild swelling BL LE  Skin:    General: Skin is warm and dry.  Neurological:     Mental Status: She is alert and oriented to person, place, and time.     Vital Signs: BP 128/89 (BP Location: Right Arm)   Pulse 91   Temp 97.8 F (  36.6 C) (Oral)   Resp 16   Ht 5' 5 (1.651 m)   Wt  58 kg   SpO2 91%   BMI 21.28 kg/m  Pain Scale: 0-10   Pain Score: 0-No pain   SpO2: SpO2: 91 % O2 Device:SpO2: 91 % O2 Flow Rate: .   IO: Intake/output summary: No intake or output data in the 24 hours ending 09/06/23 0955  LBM: Last BM Date : 09/03/23 Baseline Weight: Weight: 57.2 kg Most recent weight: Weight: 58 kg     Palliative Assessment/Data:     Time In: 0800  Time Out: 0855 Time Total: 55 minutes  Greater than 50%  of this time was spent counseling and coordinating care related to the above assessment and plan.  Signed by: Lorenza DELENA Birkenhead, NP   Please contact Palliative Medicine Team phone at [phone] for questions and concerns.  For individual provider: See Tracey

## 2023-09-06 NOTE — Consult Note (Signed)
 Cardiology Consultation   Patient ID: Leslie Williamson MRN: [id number]; DOB: Oct 11, 1931  Admit date: 09/05/2023 Date of Consult: 09/06/2023  PCP:  Carlette Benita Area, MD   [facility] Providers Cardiologist:  Croix Presley   Patient Profile: Leslie Williamson is a 89 y.o. female with a hx of  multiple myeloma, lymphoma, HTN, HLD, chronic HFpEF, history of PE on eliquis  who is being seen 09/06/2023 for the evaluation of chest pain and SOB at the request of Dr Willette.  History of Present Illness: Leslie Williamson 89 yo female history of multiple myeloma, lymphoma, HTN, HLD, chronic HFpEF, history of PE on eliquis , presented with SOB and chest pain. Progressive SOB and LE edema over the last few days. Recent URI symptoms, from notes drowsiness and fatigue from promethazine and poor compliance with medications over the last few days. On my history reports an isolated episode of chest pain yesterday lasted about 30 minutes, worst with deep breathing.    WBC 5.6 Hgb 9 Plt 363 K 4.4 Cr 1.91 BUN 53 AST 70 ALT 111 BNP >4500 Trop  261-->272--> EKG sinus tach, no acute ischemic changes CXR: underinflation, small pleural effusions and adjacent opacities CT C/A/P: moderate right and small left pleural effusions  Jan 2025 echo: LVE 50-55%, no WMAs, grade I dd, normal RV function   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)    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   Vertigo, labyrinthine     Past Surgical History:  Procedure Laterality Date   CATARACT EXTRACTION W/PHACO Left 11/03/2015   Procedure: CATARACT EXTRACTION PHACO AND INTRAOCULAR LENS PLACEMENT (IOC);  Surgeon: Cherene Mania, MD;  Location: [facility];  Service: Ophthalmology;  Laterality: Left;  CDE: 7.74   CATARACT EXTRACTION W/PHACO Right 11/20/2015   Procedure: CATARACT EXTRACTION PHACO AND INTRAOCULAR LENS PLACEMENT ; CDE:  8.30;  Surgeon: Cherene Mania, MD;  Location: [facility];  Service: Ophthalmology;  Laterality: Right;   IR IMAGING GUIDED PORT INSERTION  11/04/2021   MASS EXCISION Right 02/18/2023   Procedure: Incisional Biopsy Right Lower Extremity;  Surgeon: Kallie Manuelita BROCKS, MD;  Location: [facility];  Service: General;  Laterality: Right;   PORT-A-CATH REMOVAL        Scheduled Meds:  acyclovir   400 mg Oral BID   allopurinol   300 mg Oral Daily   [START ON 09/07/2023] apixaban   5 mg Oral BID   Chlorhexidine  Gluconate Cloth  6 each Topical Daily   dapagliflozin  propanediol  10 mg Oral Daily   furosemide   40 mg Intravenous BID   lenalidomide   10 mg Oral Daily   sodium chloride  flush  3 mL Intravenous Q12H   Continuous Infusions:  sodium chloride      PRN Meds: sodium chloride , acetaminophen  **OR** acetaminophen , bisacodyl , ondansetron  **OR** ondansetron  (ZOFRAN ) IV, polyethylene glycol, sodium chloride  flush, traZODone   Allergies:  Allergies  Allergen Reactions   Penicillins Rash    Social History:   Social History   Socioeconomic History   Marital status: Widowed    Spouse name: Not on file   Number of children: Not on file   Years of education: Not on file   Highest education level: Not on file  Occupational History   Not on file  Tobacco Use   Smoking status: Former   Smokeless tobacco: Never  Vaping Use   Vaping status: Never Used  Substance and Sexual Activity   Alcohol  use: No   Drug use: No   Sexual activity: Not Currently  Other  Topics Concern   Not on file  Social History Narrative   Not on file   Social Drivers of Health   Financial Resource Strain: Low Risk  (12/31/2019)   Overall Financial Resource Strain (CARDIA)    Difficulty of Paying Living Expenses: Not hard at all  Food Insecurity: No Food Insecurity (09/05/2023)   Hunger Vital Sign    Worried About Running Out of Food in the Last Year: Never true    Ran Out of Food in the Last Year: Never true  Transportation Needs: No Transportation Needs (09/05/2023)   PRAPARE - Administrator, Civil Service (Medical): No    Lack of Transportation (Non-Medical): No  Physical Activity: Inactive (12/31/2019)   Exercise Vital Sign    Days of Exercise per Week: 0 days    Minutes of Exercise per Session: 0 min  Stress: No Stress Concern Present (12/31/2019)   [employer] of Occupational Health - Occupational Stress Questionnaire    Feeling of Stress : Not at all  Social Connections: Moderately Isolated (09/05/2023)   Social Connection and Isolation Panel    Frequency of Communication with Friends and Family: Once a week    Frequency of Social Gatherings with Friends and Family: Once a week    Attends Religious Services: 1 to 4 times per year    Active Member of [employer] or Organizations: No    Attends Banker Meetings: 1 to 4 times per year    Marital Status: Widowed  Intimate Partner Violence: Not At Risk (09/05/2023)   Humiliation, Afraid, Rape, and Kick questionnaire    Fear of Current or Ex-Partner: No    Emotionally Abused: No    Physically Abused: No    Sexually Abused: No    Family History:    Family History  Problem Relation Age of Onset   Diabetes Mother      ROS:  Please see the history of present illness.   All other ROS reviewed and negative.     Physical Exam/Data: Vitals:   09/05/23 1800 09/05/23 2011 09/06/23 0017 09/06/23 0415  BP: 116/82 102/66 (!) 106/52 128/89  Pulse: 90 95 91 91  Resp: 20 14 12 16    Temp:  98.1 F (36.7 C) 98.2 F (36.8 C) 97.8 F (36.6 C)  TempSrc:  Oral Oral Oral  SpO2: 95% 100% 100% 91%  Weight:    58 kg  Height:       No intake or output data in the 24 hours ending 09/06/23 0844    09/06/2023    4:15 AM 09/05/2023    1:19 PM 08/25/2023    3:28 PM  Last 3 Weights  Weight (lbs) 127 lb 13.9 oz 126 lb 1.6 oz 126 lb 1.6 oz  Weight (kg) 58 kg 57.199 kg 57.199 kg     Body  mass index is 21.28 kg/m.  General:  Well nourished, well developed, in no acute distress HEENT: normal Neck: +JVD Vascular: No carotid bruits; Distal pulses 2+ bilaterally Cardiac:  normal S1, S2; RRR; no murmur  Lungs:  crackles bilaterally Abd: soft, nontender, no hepatomegaly  Ext: no edema Musculoskeletal:  No deformities, BUE and BLE strength normal and equal Skin: warm and dry  Neuro:  CNs 2-12 intact, no focal abnormalities noted Psych:  Normal affect     Laboratory Data: High Sensitivity Troponin:   Recent Labs  Lab 09/05/23 1345 09/05/23 1501  TROPONINIHS 261* 272*     Chemistry Recent Labs  Lab 09/05/23 1345 09/05/23 1410 09/06/23 0507  NA 140  --  143  K 4.4  --  3.7  CL 108  --  113*  CO2 16*  --  20*  GLUCOSE 115*  --  91  BUN 53*  --  55*  CREATININE 1.91*  --  1.73*  CALCIUM  8.7*  --  8.3*  MG  --  2.1  --   GFRNONAA 25*  --  28*  ANIONGAP 16*  --  10    Recent Labs  Lab 09/05/23 1345 09/06/23 0507  PROT 5.5*  --   ALBUMIN 3.3* 3.0*  AST 70*  --   ALT 111*  --   ALKPHOS 170*  --   BILITOT 1.4*  --    Lipids No results for input(s): CHOL, TRIG, HDL, LABVLDL, LDLCALC, CHOLHDL in the last 168 hours.  Hematology Recent Labs  Lab 09/05/23 1345 09/06/23 0507  WBC 5.6 4.8  RBC 3.78* 3.58*  HGB 9.0* 8.2*  HCT 28.3* 26.7*  MCV 74.9* 74.6*  MCH 23.8* 22.9*  MCHC 31.8 30.7  RDW 19.6* 19.2*  PLT 363 328   Thyroid No results for input(s): TSH, FREET4 in the last 168 hours.  BNP Recent Labs  Lab 09/05/23 1410  BNP  >4,500.0*    DDimer No results for input(s): DDIMER in the last 168 hours.  Radiology/Studies:  CT CHEST ABDOMEN PELVIS WO CONTRAST Result Date: 09/05/2023 CLINICAL DATA:  B-cell lymphoma, recent therapy. Chest pain for 2 days. Nausea and shortness of breath. * Tracking Code: BO * EXAM: CT CHEST, ABDOMEN AND PELVIS WITHOUT CONTRAST TECHNIQUE: Multidetector CT imaging of the chest, abdomen and pelvis was performed following the standard protocol without IV contrast. RADIATION DOSE REDUCTION: This exam was performed according to the departmental dose-optimization program which includes automated exposure control, adjustment of the mA and/or kV according to patient size and/or use of iterative reconstruction technique. COMPARISON:  PET-CT 08/11/2023 FINDINGS: CT CHEST FINDINGS Cardiovascular: Right Port-A-Cath tip: Right atrium. Coronary, aortic arch, and [place] Wisehart vessel atherosclerotic vascular disease. Moderate cardiomegaly. Mediastinum/Nodes: Unremarkable Lungs/Pleura: Moderate right and small left pleural effusion. Associated passive atelectasis in addition to mild atelectasis posteriorly in the right middle lobe and left upper lobe Musculoskeletal: Thoracic kyphosis and spondylosis. CT ABDOMEN PELVIS FINDINGS Hepatobiliary: Cholecystectomy.  Otherwise unremarkable. Pancreas: Unremarkable Spleen: Splenectomy. Adrenals/Urinary Tract: Small left adrenal adenoma unchanged. No other significant findings. Stomach/Bowel: Sigmoid colon diverticulosis. Scattered diverticula in the ascending colon. No active diverticulitis is identified. No dilated bowel. Vascular/Lymphatic: Atherosclerosis is present, including aortoiliac atherosclerotic disease. No pathologic adenopathy identified. Reproductive: Uterus absent Other: Small collection of fluid along the right paracolic gutter unchanged and not previously hypermetabolic. Musculoskeletal: 7 mm degenerative anterolisthesis at L3-4. Multilevel lumbar degenerative facet  arthropathy. IMPRESSION: 1. Moderate right and small left pleural effusions with associated passive atelectasis. 2. Moderate cardiomegaly. 3.  Small left adrenal adenoma unchanged. 4. Colonic diverticulosis. 5. 7 mm degenerative anterolisthesis at L3-4. Multilevel lumbar degenerative facet arthropathy. 6.  Aortic Atherosclerosis (ICD10-I70.0). Electronically Signed   By: Ryan Salvage M.D.   On: 09/05/2023 16:53   DG Chest 2 View Result Date: 09/05/2023 CLINICAL DATA:  Chest pain history of B-cell lymphoma EXAM: CHEST - 2 VIEW COMPARISON:  X-ray 05/24/2023. FINDINGS: Underinflation. Enlarged cardiopericardial silhouette with calcified aorta. New small pleural effusions adjacent lung opacities. Recommend follow-up. No pneumothorax or edema. Right IJ chest port with tip overlying the upper right atrium. Overlapping cardiac leads. Surgical clips in the upper abdomen. Diffuse degenerative changes. IMPRESSION: Underinflation with small pleural effusions and adjacent opacities. Recommend follow-up. Chest port. Enlarged cardiopericardial silhouette. Electronically Signed   By: Ranell Bring M.D.   On: 09/05/2023 13:49     Assessment and Plan: 1.Acute on chronic HFpEF - Jan 2025 echo: LVE 50-55%, no WMAs, grade I dd, normal RV function - CT bilateral pleural effusions, BNP >4500. Elevated Cr and LFTs on admission unclear if HF related. Check lactic acid.  - repeat limited echo.   - received IV lasix  80mg  x 1 yesterday. Due for lasix  40mg  bid today. Early incomplete I/Os. Downtrend in Cr with diuresis consistent with venous congestion and HF.   -she is on farxiga  10mg  daily, follow GFR  2. Elevated troponin - elevated trop in setting of significant HF, suspect most likely demand ischemnia - isolated episode of atypical chest pain worst with deep breathing - at this time do not suspect ACS - poor candidate to consider cath given advanced age, lymphoma, AKI, chronic anemia - manage HF and follow  symptoms.     3.AKI - Cr up to 1.9 on admission, basleine around 0.9 to 1.1 - slight downtrend with diuresis, may be related to venous congestion and HF.   4 Elevated LFTs - follow trend with diuresis, possibly venous congestion.    5.History of PE - on eliquis    6. Mulitple myeloma - followed by oncology, Revlimid  and Darzalex  on hold while treating lymphoma.   7. Lymphoma - completed 6 cycles of R mini CHOP  - PET scan from 08/11/2023: Similar size and mild increase in hypermetabolic involving soft tissue mass in the right lower extremity, [place] 4. New gastric fundal hypermetabolism with persistent wall thickening.       For questions or updates, please contact [facility] Please consult [email or web address] for contact info under    Signed, Alvan Carrier, MD  09/06/2023 8:44 AM

## 2023-09-06 NOTE — Progress Notes (Signed)
 Transition of Care Department [facility]) has reviewed patient and no other TOC needs have been identified at this time. We will continue to monitor patient advancement through interdisciplinary progression rounds. If new patient transition needs arise, please place a TOC consult.   09/06/23 0804  TOC Brief Assessment  Insurance and Status Reviewed  Patient has primary care physician Yes  Home environment has been reviewed Lives with son.  Prior level of function: Fairly independent.  Prior/Current Home Services No current home services  Social Drivers of Health Review SDOH reviewed no interventions necessary  Readmission risk has been reviewed Yes  Transition of care needs no transition of care needs at this time

## 2023-09-06 NOTE — Plan of Care (Signed)

## 2023-09-06 NOTE — Telephone Encounter (Signed)
 Thanks, patient will need to be seen in the office upon discharge from the hospital to reschedule her as we need to make sure she is medically optimized to have EGD performed. Please let him know about this and to make a follow up appointment in 2-3 weeks

## 2023-09-06 NOTE — Progress Notes (Signed)
 Prelim review of echo shows acute drop in LVEF around 25%. Elevated Cr, LFTs, lactic acid consistent with cardiogenic shock and congestion. Poor diuresis, increased SOB this afternoon. Spoke with patient and son. 89 yo but functional, independent with ADLs without significant cognitive limitations. Son and patient in favor of escatlating level of care, we did discuss that inotropes likely to be more of short term management for symptoms without significant longterm effects. Needs hematology to weigh in on her prognosis regarding her lymphoma as well. For now will manage aggressively. Transfer to ICU, check coox off her port, start milrinone  0.125.    Dorn Ross MD

## 2023-09-06 NOTE — Hospital Course (Addendum)
 Leslie Williamson is a 89 y.o. female with medical history significant for chronic diastolic dysfunction CHF, HTN, small B-cell lymphoma of the spleen/non-Hodgkin's lymphoma, PAD, essential thrombocytosis, JAK2 plus myelo proliferative disorder, MGUS and GERD Presents with son Leslie Williamson at bedside with complaints of chest discomfort increasing shortness of breath and nausea for the last couple days patient apparently was dealing with URI symptoms-she was given promethazine for URI symptoms and cough patient became very sedated from the promethazine for the last couple days so she stopped taking all her medications including cardiac meds.    -Additional history obtained from patient's son Leslie Williamson at bedside   ED - CT chest abdomen and pelvis with moderate right and small left pleural effusions with passive atelectasis, moderate cardiomegaly, otherwise no acute changes COVID, flu and RSV negative Troponin 261 repeat 272---EKG with sinus rhythm no acute findings - BNP greater than 4500 - Creatinine is up to 1.91 it was 1.14 on 08/18/2023 -AST 70 ALT 111 alk phos 170 T. bili 1.4 ,LFTs were previously normal - WBC 5.6 hemoglobin 9.0 similar to priors low MCV and MCH noted platelets 363      Assessment & Plan:   Principal Problem:   Acute exacerbation of CHF (congestive heart failure) (HCC) Active Problems:   JAK2+ MDP (myeloproliferative disorder) presenting with thrombocytosis (HCC)   PVD- know Lt SFA disease, bilat PT and AT disease   GERD   NHL (non-Hodgkin's lymphoma) (HCC)   Essential thrombocytosis (HCC)   MGUS (monoclonal gammopathy of unknown significance)   HFrEF   Malignant neoplasm of lymphoid, hematopoietic and related tissue, unspecified (HCC)   Assessment and Plan:  Acute on chronic diastolic dysfunction CHF - due to missed medication doses - Echo from 1/ 2025 with EF of 55% with grade 1 diastolic function - CT chest abdomen and pelvis with moderate right and small left pleural  effusions with passive atelectasis, moderate cardiomegaly, - BNP > 4500 - Will continue IV Lasix  - Will monitor daily weight, I's and O's REDs Vest  -Consulted cardiology, appreciate further evaluation and input   Type 2 NSTEMI - Troponin 261 repeat 272---EKG with sinus rhythm no acute findings--- not consistent with ACS - Suspect due to demand ischemia in the setting of #1 above   AKI - Acute kidney injury on CKD stage - 3A -  Creatinine is up to 1.91 it was 1.14 on 08/18/2023 Lab Results  Component Value Date   CREATININE 1.73 (H) 09/06/2023   CREATININE 1.91 (H) 09/05/2023   CREATININE 1.14 (H) 08/18/2023   -Monitor renal function closely with diuresis ---renally adjust medications, avoid nephrotoxic agents / dehydration  / hypotension   Acute  transaminitis - suspect due to hepatic congestion in setting  above    Latest Ref Rng & Units 09/06/2023    5:07 AM 09/05/2023    1:45 PM 08/18/2023    9:45 AM  Hepatic Function  Total Protein 6.5 - 8.1 g/dL  5.5  5.4   Albumin 3.5 - 5.0 g/dL 3.0  3.3  3.1   AST 15 - 41 U/L  70  13   ALT 0 - 44 U/L  111  8   Alk Phosphatase 38 - 126 U/L  170  71   Total Bilirubin 0.0 - 1.2 mg/dL  1.4  0.7   Holding statins  - Monitor closely   chronic anemia----hemoglobin 9.0 similar to priors low MCV and MCH noted platelets 363 - No bleeding concerns    Latest Ref Rng &  Units 09/06/2023    5:07 AM 09/05/2023    1:45 PM 08/18/2023    9:45 AM  CBC  WBC 4.0 - 10.5 K/uL 4.8  5.6  6.2   Hemoglobin 12.0 - 15.0 g/dL 8.2  9.0  7.8   Hematocrit 36.0 - 46.0 % 26.7  28.3  25.6   Platelets 150 - 400 K/uL 328  363  573      PAD--- continue apixaban  and atorvastatin    small B-cell lymphoma of the spleen/non-Hodgkin's lymphoma, PAD, essential thrombocytosis, JAK2 plus myelo proliferative disorder, MGUS --- currently undergoing treatment with Dr. Katragadda

## 2023-09-06 NOTE — Progress Notes (Signed)
 PROGRESS NOTE    Patient: Leslie Williamson                            PCP: [name], [name] Demissie, MD                    DOB: 1931/09/08            DOA: 09/05/2023 FMW:[id number]             DOS: 09/06/2023, 10:40 AM   LOS: 1 day   Date of Service: The patient was seen and examined on 09/06/2023  Subjective:   The patient was seen and examined this morning. Hemodynamically stable. Denies having chest pain, mild shortness of the rest, Currently satting 91% on room air. No issues overnight .  Brief Narrative:   Leslie Williamson is a 89 y.o. female with medical history significant for chronic diastolic dysfunction CHF, HTN, small B-cell lymphoma of the spleen/non-Hodgkin's lymphoma, PAD, essential thrombocytosis, JAK2 plus myelo proliferative disorder, MGUS and GERD Presents with son [name] at bedside with complaints of chest discomfort increasing shortness of breath and nausea for the last couple days patient apparently was dealing with URI symptoms-she was given promethazine for URI symptoms and cough patient became very sedated from the promethazine for the last couple days so she stopped taking all her medications including cardiac meds.    -Additional history obtained from patient's son [name] at bedside   ED - CT chest abdomen and pelvis with moderate right and small left pleural effusions with passive atelectasis, moderate cardiomegaly, otherwise no acute changes COVID, flu and RSV negative Troponin 261 repeat 272---EKG with sinus rhythm no acute findings - BNP greater than 4500 - Creatinine is up to 1.91 it was 1.14 on 08/18/2023 -AST 70 ALT 111 alk phos 170 T. bili 1.4 ,LFTs were previously normal - WBC 5.6 hemoglobin 9.0 similar to priors low MCV and MCH noted platelets 363      Assessment & Plan:   Principal Problem:   Acute exacerbation of CHF (congestive heart failure) (HCC) Active Problems:   JAK2+ MDP (myeloproliferative disorder) presenting with thrombocytosis  (HCC)   PVD- know Lt SFA disease, bilat PT and AT disease   GERD   NHL (non-Hodgkin's lymphoma) (HCC)   Essential thrombocytosis (HCC)   MGUS (monoclonal gammopathy of unknown significance)   HFrEF   Malignant neoplasm of lymphoid, hematopoietic and related tissue, unspecified (HCC)   Assessment and Plan:  Acute on chronic diastolic dysfunction CHF - due to missed medication doses - Echo from 1/ 2025 with EF of 55% with grade 1 diastolic function - CT chest abdomen and pelvis with moderate right and small left pleural effusions with passive atelectasis, moderate cardiomegaly, - BNP > 4500 - Will continue IV Lasix  - Will monitor daily weight, I's and O's REDs Vest  -Consulted cardiology, appreciate further evaluation and input   Type 2 NSTEMI - Troponin 261 repeat 272---EKG with sinus rhythm no acute findings--- not consistent with ACS - Suspect due to demand ischemia in the setting of #1 above   AKI - Acute kidney injury on CKD stage - 3A -  Creatinine is up to 1.91 it was 1.14 on 08/18/2023 Lab Results  Component Value Date   CREATININE 1.73 (H) 09/06/2023   CREATININE 1.91 (H) 09/05/2023   CREATININE 1.14 (H) 08/18/2023   -Monitor renal function closely with diuresis ---renally adjust medications, avoid nephrotoxic agents / dehydration  /  hypotension   Acute  transaminitis - suspect due to hepatic congestion in setting  above    Latest Ref Rng & Units 09/06/2023    5:07 AM 09/05/2023    1:45 PM 08/18/2023    9:45 AM  Hepatic Function  Total Protein 6.5 - 8.1 g/dL  5.5  5.4   Albumin 3.5 - 5.0 g/dL 3.0  3.3  3.1   AST 15 - 41 U/L  70  13   ALT 0 - 44 U/L  111  8   Alk Phosphatase 38 - 126 U/L  170  71   Total Bilirubin 0.0 - 1.2 mg/dL  1.4  0.7   Holding statins  - Monitor closely   chronic anemia----hemoglobin 9.0 similar to priors low MCV and MCH noted platelets 363 - No bleeding concerns    Latest Ref Rng & Units 09/06/2023    5:07 AM 09/05/2023    1:45 PM  08/18/2023    9:45 AM  CBC  WBC 4.0 - 10.5 K/uL 4.8  5.6  6.2   Hemoglobin 12.0 - 15.0 g/dL 8.2  9.0  7.8   Hematocrit 36.0 - 46.0 % 26.7  28.3  25.6   Platelets 150 - 400 K/uL 328  363  573      PAD--- continue apixaban  and atorvastatin    small B-cell lymphoma of the spleen/non-Hodgkin's lymphoma, PAD, essential thrombocytosis, JAK2 plus myelo proliferative disorder, MGUS --- currently undergoing treatment with Dr. Rogers     ------------------------------------------------------------------------------------------------------------------------------------------- Nutritional status:  The patient's BMI is: Body mass index is 21.28 kg/m. I agree with the assessment and plan as outlined -------------------------------------------------------------------------------------------------------------------------------------------  DVT prophylaxis:  SCDs Start: 09/05/23 1717 Place TED hose Start: 09/05/23 1717 apixaban  (ELIQUIS ) tablet 5 mg   Code Status:   Code Status: Limited: Do not attempt resuscitation (DNR) -DNR-LIMITED -Do Not Intubate/DNI   Family Communication: No family member present at bedside-  -Advance care planning has been discussed.   Admission status:   Status is: Inpatient Remains inpatient appropriate because: Needing IV diuretics   Disposition: From  - home             Planning for discharge in 1-2 days. Home w [facility]   Procedures:   No admission procedures for hospital encounter.   Antimicrobials:  Anti-infectives (From admission, onward)    Start     Dose/Rate Route Frequency Ordered Stop   09/05/23 2200  acyclovir  (ZOVIRAX ) tablet 400 mg        400 mg Oral 2 times daily 09/05/23 1717          Medication:   acyclovir   400 mg Oral BID   allopurinol   300 mg Oral Daily   [START ON 09/07/2023] apixaban   5 mg Oral BID   Chlorhexidine  Gluconate Cloth  6 each Topical Daily   dapagliflozin  propanediol  10 mg Oral Daily   furosemide   40 mg Intravenous  BID   sodium chloride  flush  3 mL Intravenous Q12H    sodium chloride , acetaminophen  **OR** acetaminophen , bisacodyl , ondansetron  **OR** ondansetron  (ZOFRAN ) IV, polyethylene glycol, sodium chloride  flush, traZODone    Objective:   Vitals:   09/05/23 1800 09/05/23 2011 09/06/23 0017 09/06/23 0415  BP: 116/82 102/66 (!) 106/52 128/89  Pulse: 90 95 91 91  Resp: 20 14 12 16   Temp:  98.1 F (36.7 C) 98.2 F (36.8 C) 97.8 F (36.6 C)  TempSrc:  Oral Oral Oral  SpO2: 95% 100% 100% 91%  Weight:    58 kg  Height:       No intake or output data in the 24 hours ending 09/06/23 1040 Filed Weights   09/05/23 1319 09/06/23 0415  Weight: 57.2 kg 58 kg     Physical examination:   General:  AAO x 3,  cooperative, no distress;   HEENT:  Normocephalic, PERRL, otherwise with in Normal limits   Neuro:  CNII-XII intact. , normal motor and sensation, reflexes intact   Lungs:   Clear to auscultation BL, Respirations unlabored,  No wheezes / crackles  Cardio:    S1/S2, RRR, No murmure, No Rubs or Gallops   Abdomen:  Soft, non-tender, bowel sounds active all four quadrants, no guarding or peritoneal signs.  Muscular  skeletal:  Limited exam -global generalized weaknesses - in bed, able to move all 4 extremities,   2+ pulses,  symmetric,+ 2 pitting edema  Skin:  Dry, warm to touch, negative for any Rashes,  Wounds: Please see nursing documentation       ------------------------------------------------------------------------------------------------------------------------------------------    LABs:     Latest Ref Rng & Units 09/06/2023    5:07 AM 09/05/2023    1:45 PM 08/18/2023    9:45 AM  CBC  WBC 4.0 - 10.5 K/uL 4.8  5.6  6.2   Hemoglobin 12.0 - 15.0 g/dL 8.2  9.0  7.8   Hematocrit 36.0 - 46.0 % 26.7  28.3  25.6   Platelets 150 - 400 K/uL 328  363  573       Latest Ref Rng & Units 09/06/2023    8:55 AM 09/06/2023    5:07 AM 09/05/2023    1:45 PM  CMP  Glucose 70 - 99 mg/dL   91  884   BUN 8 - 23 mg/dL  55  53   Creatinine 9.55 - 1.00 mg/dL  8.26  8.08   Sodium 864 - 145 mmol/L  143  140   Potassium 3.5 - 5.1 mmol/L  3.7  4.4   Chloride 98 - 111 mmol/L  113  108   CO2 22 - 32 mmol/L  20  16   Calcium  8.9 - 10.3 mg/dL  8.3  8.7   Total Protein 6.5 - 8.1 g/dL 4.9   5.5   Total Bilirubin 0.0 - 1.2 mg/dL 1.6   1.4   Alkaline Phos 38 - 126 U/L 155   170   AST 15 - 41 U/L 70   70   ALT 0 - 44 U/L 118   111        Micro Results Recent Results (from the past 240 hours)  Resp panel by RT-PCR (RSV, Flu A&B, Covid) Anterior Nasal Swab     Status: None   Collection Time: 09/05/23  4:33 PM   Specimen: Anterior Nasal Swab  Result Value Ref Range Status   SARS Coronavirus 2 by RT PCR NEGATIVE NEGATIVE Final    Comment: (NOTE) SARS-CoV-2 target nucleic acids are NOT DETECTED.  The SARS-CoV-2 RNA is generally detectable in upper respiratory specimens during the acute phase of infection. The lowest concentration of SARS-CoV-2 viral copies this assay can detect is 138 copies/mL. A negative result does not preclude SARS-Cov-2 infection and should not be used as the sole basis for treatment or other patient management decisions. A negative result may occur with  improper specimen collection/handling, submission of specimen other than nasopharyngeal swab, presence of viral mutation(s) within the areas targeted by this assay, and inadequate number of viral copies(<138 copies/mL). A negative result must  be combined with clinical observations, patient history, and epidemiological information. The expected result is Negative.  Fact Sheet for Patients:  BloggerCourse.com  Fact Sheet for Healthcare Providers:  SeriousBroker.it  This test is no t yet approved or cleared by the [place]  FDA and  has been authorized for detection and/or diagnosis of SARS-CoV-2 by FDA under an Emergency Use Authorization (EUA). This  EUA will remain  in effect (meaning this test can be used) for the duration of the COVID-19 declaration under Section 564(b)(1) of the Act, 21 U.S.C.section 360bbb-3(b)(1), unless the authorization is terminated  or revoked sooner.       Influenza A by PCR NEGATIVE NEGATIVE Final   Influenza B by PCR NEGATIVE NEGATIVE Final    Comment: (NOTE) The Xpert Xpress SARS-CoV-2/FLU/RSV plus assay is intended as an aid in the diagnosis of influenza from Nasopharyngeal swab specimens and should not be used as a sole basis for treatment. Nasal washings and aspirates are unacceptable for Xpert Xpress SARS-CoV-2/FLU/RSV testing.  Fact Sheet for Patients: BloggerCourse.com  Fact Sheet for Healthcare Providers: SeriousBroker.it  This test is not yet approved or cleared by the [place]  FDA and has been authorized for detection and/or diagnosis of SARS-CoV-2 by FDA under an Emergency Use Authorization (EUA). This EUA will remain in effect (meaning this test can be used) for the duration of the COVID-19 declaration under Section 564(b)(1) of the Act, 21 U.S.C. section 360bbb-3(b)(1), unless the authorization is terminated or revoked.     Resp Syncytial Virus by PCR NEGATIVE NEGATIVE Final    Comment: (NOTE) Fact Sheet for Patients: BloggerCourse.com  Fact Sheet for Healthcare Providers: SeriousBroker.it  This test is not yet approved or cleared by the [place]  FDA and has been authorized for detection and/or diagnosis of SARS-CoV-2 by FDA under an Emergency Use Authorization (EUA). This EUA will remain in effect (meaning this test can be used) for the duration of the COVID-19 declaration under Section 564(b)(1) of the Act, 21 U.S.C. section 360bbb-3(b)(1), unless the authorization is terminated or revoked.  Performed at [facility], [address], [place], [place] [address]      Radiology Reports CT CHEST ABDOMEN PELVIS WO CONTRAST Result Date: 09/05/2023 CLINICAL DATA:  B-cell lymphoma, recent therapy. Chest pain for 2 days. Nausea and shortness of breath. * Tracking Code: BO * EXAM: CT CHEST, ABDOMEN AND PELVIS WITHOUT CONTRAST TECHNIQUE: Multidetector CT imaging of the chest, abdomen and pelvis was performed following the standard protocol without IV contrast. RADIATION DOSE REDUCTION: This exam was performed according to the departmental dose-optimization program which includes automated exposure control, adjustment of the mA and/or kV according to patient size and/or use of iterative reconstruction technique. COMPARISON:  PET-CT 08/11/2023 FINDINGS: CT CHEST FINDINGS Cardiovascular: Right Port-A-Cath tip: Right atrium. Coronary, aortic arch, and branch vessel atherosclerotic vascular disease. Moderate cardiomegaly. Mediastinum/Nodes: Unremarkable Lungs/Pleura: Moderate right and small left pleural effusion. Associated passive atelectasis in addition to mild atelectasis posteriorly in the right middle lobe and left upper lobe Musculoskeletal: Thoracic kyphosis and spondylosis. CT ABDOMEN PELVIS FINDINGS Hepatobiliary: Cholecystectomy.  Otherwise unremarkable. Pancreas: Unremarkable Spleen: Splenectomy. Adrenals/Urinary Tract: Small left adrenal adenoma unchanged. No other significant findings. Stomach/Bowel: Sigmoid colon diverticulosis. Scattered diverticula in the ascending colon. No active diverticulitis is identified. No dilated bowel. Vascular/Lymphatic: Atherosclerosis is present, including aortoiliac atherosclerotic disease. No pathologic adenopathy identified. Reproductive: Uterus absent Other: Small collection of fluid along the right paracolic gutter unchanged and not previously hypermetabolic. Musculoskeletal: 7 mm degenerative anterolisthesis at L3-4. Multilevel lumbar  degenerative facet arthropathy. IMPRESSION: 1. Moderate right and small left pleural effusions with  associated passive atelectasis. 2. Moderate cardiomegaly. 3. Small left adrenal adenoma unchanged. 4. Colonic diverticulosis. 5. 7 mm degenerative anterolisthesis at L3-4. Multilevel lumbar degenerative facet arthropathy. 6.  Aortic Atherosclerosis (ICD10-I70.0). Electronically Signed   By: Ryan Salvage M.D.   On: 09/05/2023 16:53   DG Chest 2 View Result Date: 09/05/2023 CLINICAL DATA:  Chest pain history of B-cell lymphoma EXAM: CHEST - 2 VIEW COMPARISON:  X-ray 05/24/2023. FINDINGS: Underinflation. Enlarged cardiopericardial silhouette with calcified aorta. New small pleural effusions adjacent lung opacities. Recommend follow-up. No pneumothorax or edema. Right IJ chest port with tip overlying the upper right atrium. Overlapping cardiac leads. Surgical clips in the upper abdomen. Diffuse degenerative changes. IMPRESSION: Underinflation with small pleural effusions and adjacent opacities. Recommend follow-up. Chest port. Enlarged cardiopericardial silhouette. Electronically Signed   By: Ranell Bring M.D.   On: 09/05/2023 13:49    SIGNED: Adriana DELENA Grams, MD, FHM. FAAFP. Jolynn Pack - [facility] Time spent - 55 min.  In seeing, evaluating and examining the patient. Reviewing medical records, labs, drawn plan of care. [facility],  Pager (please use [email or web address] to page/ text) Please use Epic Secure Chat for non-urgent communication (7AM-7PM)  If 7PM-7AM, please contact night-coverage [email or web address], 09/06/2023, 10:40 AM

## 2023-09-06 NOTE — Telephone Encounter (Signed)
 Spoke with pt's son Lynwood (on dpr) and he states pt is currently admitted to the hospital.

## 2023-09-06 NOTE — Progress Notes (Signed)
*  PRELIMINARY RESULTS* Echocardiogram Limited 2-D Echocardiogram has been performed with Definity .  Teresa Aida PARAS 09/06/2023, 4:57 PM

## 2023-09-06 NOTE — Progress Notes (Signed)
 Critical lactic of 3.2, MD Shahmehdi paged.

## 2023-09-07 DIAGNOSIS — D472 Monoclonal gammopathy: Secondary | ICD-10-CM | POA: Diagnosis not present

## 2023-09-07 DIAGNOSIS — I5021 Acute systolic (congestive) heart failure: Secondary | ICD-10-CM | POA: Diagnosis not present

## 2023-09-07 DIAGNOSIS — I427 Cardiomyopathy due to drug and external agent: Secondary | ICD-10-CM

## 2023-09-07 DIAGNOSIS — N179 Acute kidney failure, unspecified: Secondary | ICD-10-CM | POA: Diagnosis not present

## 2023-09-07 DIAGNOSIS — D473 Essential (hemorrhagic) thrombocythemia: Secondary | ICD-10-CM | POA: Diagnosis not present

## 2023-09-07 DIAGNOSIS — T451X5A Adverse effect of antineoplastic and immunosuppressive drugs, initial encounter: Secondary | ICD-10-CM

## 2023-09-07 DIAGNOSIS — C9000 Multiple myeloma not having achieved remission: Secondary | ICD-10-CM | POA: Diagnosis not present

## 2023-09-07 DIAGNOSIS — Z515 Encounter for palliative care: Secondary | ICD-10-CM | POA: Diagnosis not present

## 2023-09-07 DIAGNOSIS — I50813 Acute on chronic right heart failure: Secondary | ICD-10-CM | POA: Diagnosis not present

## 2023-09-07 DIAGNOSIS — R570 Cardiogenic shock: Secondary | ICD-10-CM

## 2023-09-07 DIAGNOSIS — I5023 Acute on chronic systolic (congestive) heart failure: Secondary | ICD-10-CM

## 2023-09-07 DIAGNOSIS — D471 Chronic myeloproliferative disease: Secondary | ICD-10-CM | POA: Diagnosis not present

## 2023-09-07 DIAGNOSIS — C8330 Diffuse large B-cell lymphoma, unspecified site: Secondary | ICD-10-CM | POA: Diagnosis not present

## 2023-09-07 DIAGNOSIS — Z7189 Other specified counseling: Secondary | ICD-10-CM | POA: Diagnosis not present

## 2023-09-07 LAB — COMPREHENSIVE METABOLIC PANEL WITH GFR
ALT: 111 U/L — ABNORMAL HIGH (ref 0–44)
AST: 52 U/L — ABNORMAL HIGH (ref 15–41)
Albumin: 3 g/dL — ABNORMAL LOW (ref 3.5–5.0)
Alkaline Phosphatase: 145 U/L — ABNORMAL HIGH (ref 38–126)
Anion gap: 14 (ref 5–15)
BUN: 46 mg/dL — ABNORMAL HIGH (ref 8–23)
CO2: 21 mmol/L — ABNORMAL LOW (ref 22–32)
Calcium: 8.3 mg/dL — ABNORMAL LOW (ref 8.9–10.3)
Chloride: 109 mmol/L (ref 98–111)
Creatinine, Ser: 1.58 mg/dL — ABNORMAL HIGH (ref 0.44–1.00)
GFR, Estimated: 31 mL/min — ABNORMAL LOW (ref >60)
Glucose, Bld: 102 mg/dL — ABNORMAL HIGH (ref 70–99)
Potassium: 3.3 mmol/L — ABNORMAL LOW (ref 3.5–5.1)
Sodium: 144 mmol/L (ref 135–145)
Total Bilirubin: 1.1 mg/dL (ref 0.0–1.2)
Total Protein: 4.6 g/dL — ABNORMAL LOW (ref 6.5–8.1)

## 2023-09-07 LAB — COOXEMETRY PANEL
Carboxyhemoglobin: 0.9 % (ref 0.5–1.5)
Methemoglobin: <0.7 % (ref 0.0–1.5)
O2 Saturation: 44.5 %
Total hemoglobin: 9.1 g/dL — ABNORMAL LOW (ref 12.0–16.0)

## 2023-09-07 LAB — CBC
HCT: 26.7 % — ABNORMAL LOW (ref 36.0–46.0)
Hemoglobin: 8.6 g/dL — ABNORMAL LOW (ref 12.0–15.0)
MCH: 23.5 pg — ABNORMAL LOW (ref 26.0–34.0)
MCHC: 32.2 g/dL (ref 30.0–36.0)
MCV: 73 fL — ABNORMAL LOW (ref 80.0–100.0)
Platelets: 343 K/uL (ref 150–400)
RBC: 3.66 MIL/uL — ABNORMAL LOW (ref 3.87–5.11)
RDW: 19.6 % — ABNORMAL HIGH (ref 11.5–15.5)
WBC: 5.1 K/uL (ref 4.0–10.5)
nRBC: 9.2 % — ABNORMAL HIGH (ref 0.0–0.2)

## 2023-09-07 LAB — BRAIN NATRIURETIC PEPTIDE: B Natriuretic Peptide: >4500 pg/mL — ABNORMAL HIGH (ref 0.0–100.0)

## 2023-09-07 LAB — LACTIC ACID, PLASMA: Lactic Acid, Venous: 0.9 mmol/L (ref 0.5–1.9)

## 2023-09-07 LAB — PROCALCITONIN: Procalcitonin: 0.23 ng/mL

## 2023-09-07 MED ORDER — POTASSIUM CHLORIDE CRYS ER 20 MEQ PO TBCR
40.0000 meq | EXTENDED_RELEASE_TABLET | ORAL | Status: AC
  Administered 2023-09-07 (×2): 40 meq via ORAL
  Filled 2023-09-07 (×2): qty 2

## 2023-09-07 MED ORDER — FUROSEMIDE 10 MG/ML IJ SOLN
40.0000 mg | Freq: Once | INTRAMUSCULAR | Status: AC
  Administered 2023-09-07: 40 mg via INTRAVENOUS
  Filled 2023-09-07: qty 4

## 2023-09-07 MED ORDER — FUROSEMIDE 10 MG/ML IJ SOLN
80.0000 mg | Freq: Two times a day (BID) | INTRAMUSCULAR | Status: AC
  Filled 2023-09-07: qty 8

## 2023-09-07 NOTE — Progress Notes (Signed)
 Do not have a Reds Vest at this time. Unable to obtain reading.

## 2023-09-07 NOTE — Progress Notes (Signed)
 Rounding Note   Patient Name: Leslie Williamson Date of Encounter: 09/07/2023  [facility] Cardiologist: None   Subjective No complaints  Scheduled Meds:  acyclovir   400 mg Oral BID   allopurinol   300 mg Oral Daily   apixaban   5 mg Oral BID   Chlorhexidine  Gluconate Cloth  6 each Topical Daily   furosemide   40 mg Intravenous BID   potassium chloride   40 mEq Oral Q4H   sodium chloride  flush  3 mL Intravenous Q12H   Continuous Infusions:  milrinone  0.25 mcg/kg/min (09/07/23 0300)   PRN Meds: acetaminophen  **OR** acetaminophen , bisacodyl , guaiFENesin -dextromethorphan , ondansetron  **OR** ondansetron  (ZOFRAN ) IV, polyethylene glycol, sodium chloride  flush, traZODone    Vital Signs  Vitals:   09/07/23 0400 09/07/23 0415 09/07/23 0749 09/07/23 0800  BP:    107/77  Pulse: 84 87  96  Resp: 16 13  (!) 21  Temp:  (!) 97.5 F (36.4 C) 97.7 F (36.5 C)   TempSrc:  Axillary Oral   SpO2: 97% 95%  98%  Weight:  58.2 kg    Height:        Intake/Output Summary (Last 24 hours) at 09/07/2023 0816 Last data filed at 09/07/2023 0400 Gross per 24 hour  Intake 514.78 ml  Output 900 ml  Net -385.22 ml      09/07/2023    4:15 AM 09/06/2023    5:52 PM 09/06/2023    4:15 AM  Last 3 Weights  Weight (lbs) 128 lb 4.9 oz 125 lb 14.1 oz 127 lb 13.9 oz  Weight (kg) 58.2 kg 57.1 kg 58 kg      Telemetry NSR - Personally Reviewed  ECG  N/a - Personally Reviewed  Physical Exam  GEN: No acute distress.   Neck: + JVD Cardiac: RRR, no murmurs, rubs, or gallops.  Respiratory: crackles bilaterally GI: Soft, nontender, non-distended  MS: 1+ bilateral LE edema Neuro:  Nonfocal  Psych: Normal affect   Labs High Sensitivity Troponin:   Recent Labs  Lab 09/05/23 1345 09/05/23 1501  TROPONINIHS 261* 272*     Chemistry Recent Labs  Lab 09/05/23 1345 09/05/23 1410 09/06/23 0507 09/06/23 0855 09/07/23 0420  NA 140  --  143  --  144  K 4.4  --  3.7  --  3.3*  CL 108  --   113*  --  109  CO2 16*  --  20*  --  21*  GLUCOSE 115*  --  91  --  102*  BUN 53*  --  55*  --  46*  CREATININE 1.91*  --  1.73*  --  1.58*  CALCIUM  8.7*  --  8.3*  --  8.3*  MG  --  2.1  --   --   --   PROT 5.5*  --   --  4.9* 4.6*  ALBUMIN 3.3*  --  3.0* 3.1* 3.0*  AST 70*  --   --  70* 52*  ALT 111*  --   --  118* 111*  ALKPHOS 170*  --   --  155* 145*  BILITOT 1.4*  --   --  1.6* 1.1  GFRNONAA 25*  --  28*  --  31*  ANIONGAP 16*  --  10  --  14    Lipids No results for input(s): CHOL, TRIG, HDL, LABVLDL, LDLCALC, CHOLHDL in the last 168 hours.  Hematology Recent Labs  Lab 09/05/23 1345 09/06/23 0507 09/07/23 0420  WBC 5.6 4.8 5.1  RBC 3.78*  3.58* 3.66*  HGB 9.0* 8.2* 8.6*  HCT 28.3* 26.7* 26.7*  MCV 74.9* 74.6* 73.0*  MCH 23.8* 22.9* 23.5*  MCHC 31.8 30.7 32.2  RDW 19.6* 19.2* 19.6*  PLT 363 328 343   Thyroid No results for input(s): TSH, FREET4 in the last 168 hours.  BNP Recent Labs  Lab 09/05/23 1410 09/07/23 0420  BNP >4,500.0* >4,500.0*    DDimer No results for input(s): DDIMER in the last 168 hours.   Radiology  ECHOCARDIOGRAM LIMITED Result Date: 09/06/2023    ECHOCARDIOGRAM LIMITED REPORT   Patient Name:   Leslie Williamson Date of Exam: 09/06/2023 Medical Rec #:  [id number]         Height:       65.0 in Accession #:    [id number]        Weight:       127.9 lb Date of Birth:  October 30, 1931         BSA:          1.636 m Patient Age:    89 years          BP:           128/89 mmHg Patient Gender: F                 HR:           91 bpm. Exam Location:  [facility] Procedure: Limited Echo, Cardiac Doppler, Strain Analysis and Intracardiac            Opacification Agent (Both Spectral and Color Flow Doppler were            utilized during procedure). Indications:    Dyspnea R06.00  History:        Patient has prior history of Echocardiogram examinations, most                 recent 03/03/2023. CHF; Risk Factors:Hypertension and                  Dyslipidemia. Hx of COVID-19.  Sonographer:    Aida Pizza RCS Referring Phys: [id number] DORN FALCON Geary Rufo  Sonographer Comments: Global longitudinal strain was attempted. IMPRESSIONS  1. Left ventricular ejection fraction, by estimation, is 20%. The left ventricle has severely decreased function. The left ventricle demonstrates global hypokinesis. The average left ventricular global longitudinal strain is -4.2 %. The global longitudinal strain is abnormal.  2. Right ventricular systolic function is normal. The right ventricular size is normal.  3. The inferior vena cava is dilated in size with <50% respiratory variability, suggesting right atrial pressure of 15 mmHg. FINDINGS  Left Ventricle: Left ventricular ejection fraction, by estimation, is 20%. The left ventricle has severely decreased function. The left ventricle demonstrates global hypokinesis. Definity  contrast agent was given IV to delineate the left ventricular endocardial borders. The average left ventricular global longitudinal strain is -4.2 %. Strain was performed and the global longitudinal strain is abnormal. The left ventricular internal cavity size was normal in size. There is no left ventricular hypertrophy. Right Ventricle: The right ventricular size is normal. Right vetricular wall thickness was not well visualized. Right ventricular systolic function is normal. Venous: The inferior vena cava is dilated in size with less than 50% respiratory variability, suggesting right atrial pressure of 15 mmHg. Additional Comments: 3D was performed not requiring image post processing on an independent workstation and was abnormal.  LEFT VENTRICLE PLAX 2D LVIDd:         5.00 cm  Diastology LVIDs:         4.60 cm   LV e' medial:    2.94 cm/s LV PW:         1.00 cm   LV E/e' medial:  28.6 LV IVS:        1.00 cm   LV e' lateral:   7.29 cm/s LVOT diam:     1.70 cm   LV E/e' lateral: 11.6 LVOT Area:     2.27 cm                          2D Longitudinal Strain                           2D Strain GLS Avg:     -4.2 %                           3D Volume EF:                          3D EF:        32 %                          LV EDV:       150 ml                          LV ESV:       101 ml                          LV SV:        49 ml RIGHT VENTRICLE RV S prime:     10.30 cm/s TAPSE (M-mode): 2.1 cm LEFT ATRIUM             Index        RIGHT ATRIUM           Index LA diam:        3.50 cm 2.14 cm/m   RA Area:     19.10 cm LA Vol (A2C):   73.0 ml 44.63 ml/m  RA Volume:   53.30 ml  32.59 ml/m LA Vol (A4C):   43.5 ml 26.59 ml/m LA Biplane Vol: 60.9 ml 37.23 ml/m   AORTA Ao Root diam: 3.30 cm MITRAL VALVE MV Area (PHT): 5.27 cm    SHUNTS MV Decel Time: 144 msec    Systemic Diam: 1.70 cm MV E velocity: 84.20 cm/s MV A velocity: 80.50 cm/s MV E/A ratio:  1.05 Dorn Ross MD Electronically signed by Dorn Ross MD Signature Date/Time: 09/06/2023/5:17:58 PM    Final    CT CHEST ABDOMEN PELVIS WO CONTRAST Result Date: 09/05/2023 CLINICAL DATA:  B-cell lymphoma, recent therapy. Chest pain for 2 days. Nausea and shortness of breath. * Tracking Code: BO * EXAM: CT CHEST, ABDOMEN AND PELVIS WITHOUT CONTRAST TECHNIQUE: Multidetector CT imaging of the chest, abdomen and pelvis was performed following the standard protocol without IV contrast. RADIATION DOSE REDUCTION: This exam was performed according to the departmental dose-optimization program which includes automated exposure control, adjustment of the mA and/or kV according to patient size and/or use of iterative reconstruction technique. COMPARISON:  PET-CT 08/11/2023 FINDINGS: CT CHEST FINDINGS Cardiovascular: Right Port-A-Cath tip: Right atrium. Coronary, aortic  arch, and Lisa Blakeman vessel atherosclerotic vascular disease. Moderate cardiomegaly. Mediastinum/Nodes: Unremarkable Lungs/Pleura: Moderate right and small left pleural effusion. Associated passive atelectasis in addition to mild atelectasis posteriorly in the right  middle lobe and left upper lobe Musculoskeletal: Thoracic kyphosis and spondylosis. CT ABDOMEN PELVIS FINDINGS Hepatobiliary: Cholecystectomy.  Otherwise unremarkable. Pancreas: Unremarkable Spleen: Splenectomy. Adrenals/Urinary Tract: Small left adrenal adenoma unchanged. No other significant findings. Stomach/Bowel: Sigmoid colon diverticulosis. Scattered diverticula in the ascending colon. No active diverticulitis is identified. No dilated bowel. Vascular/Lymphatic: Atherosclerosis is present, including aortoiliac atherosclerotic disease. No pathologic adenopathy identified. Reproductive: Uterus absent Other: Small collection of fluid along the right paracolic gutter unchanged and not previously hypermetabolic. Musculoskeletal: 7 mm degenerative anterolisthesis at L3-4. Multilevel lumbar degenerative facet arthropathy. IMPRESSION: 1. Moderate right and small left pleural effusions with associated passive atelectasis. 2. Moderate cardiomegaly. 3. Small left adrenal adenoma unchanged. 4. Colonic diverticulosis. 5. 7 mm degenerative anterolisthesis at L3-4. Multilevel lumbar degenerative facet arthropathy. 6.  Aortic Atherosclerosis (ICD10-I70.0). Electronically Signed   By: Ryan Salvage M.D.   On: 09/05/2023 16:53   DG Chest 2 View Result Date: 09/05/2023 CLINICAL DATA:  Chest pain history of B-cell lymphoma EXAM: CHEST - 2 VIEW COMPARISON:  X-ray 05/24/2023. FINDINGS: Underinflation. Enlarged cardiopericardial silhouette with calcified aorta. New small pleural effusions adjacent lung opacities. Recommend follow-up. No pneumothorax or edema. Right IJ chest port with tip overlying the upper right atrium. Overlapping cardiac leads. Surgical clips in the upper abdomen. Diffuse degenerative changes. IMPRESSION: Underinflation with small pleural effusions and adjacent opacities. Recommend follow-up. Chest port. Enlarged cardiopericardial silhouette. Electronically Signed   By: Ranell Bring M.D.   On: 09/05/2023  13:49      Patient Profile   Leslie Williamson is a 89 y.o. female with a hx of  multiple myeloma, lymphoma, HTN, HLD, chronic HFpEF, history of PE on eliquis  who is being seen 09/06/2023 for the evaluation of chest pain and SOB at the request of Dr Willette.   Assessment & Plan  1.Acute HFrEF/Cardiogenic shock - Jan 2025 echo: LVE 50-55%, no WMAs, grade I dd, normal RV function - 08/2023 echo LVEF 20%, normal RV - CT bilateral pleural effusions, BNP >4500. Elevated Cr, LFTs, lactic acid on admission  -patient with port, coox checked and 28%.  - discussed with patient and family. 89 yo but independent with ADLs, mobile, no significant cognitive deficits at baseline. Initiated milrinone  at least for palliation of symptoms and time to discuss further goals of care with family. Have palliative see patient again, touch base with oncology to weigh in on the status and prognosis of her cancer.  - coox 45% this AM, increase milrinone  to 0.332mcg/kg/min - lactate has normalized, Cr and LFTs trending down -on IV lasix  40mg  bid. Incomplete I/Os yesterday. Remains fluid overloaded, increase IV lasix  to 80mg  bid.   - review her chemo history to see if related to her new cardiomyopathy - poor cath candidate with advanced age, lymphoma    2. Elevated troponin - elevated trop in setting of significant HF, suspect most likely demand ischemnia - isolated episode of atypical chest pain worst with deep breathing - at this time do not suspect ACS - poor candidate to consider cath given advanced age, lymphoma, AKI, chronic anemia - manage HF and follow symptoms.        3.AKI - Cr up to 1.9 on admission, basleine around 0.9 to 1.1 - Cr trending down on milrinone  and with diuresis   4 Elevated LFTs - follow  trend with diuresis, possibly venous congestion.      5.History of PE - on eliquis      6. Mulitple myeloma - followed by oncology, Revlimid  and Darzalex  on hold while treating lymphoma.     7. Lymphoma - completed 6 cycles of R mini CHOP  - PET scan from 08/11/2023: Similar size and mild increase in hypermetabolic involving soft tissue mass in the right lower extremity, [place] 4. New gastric fundal hypermetabolism with persistent wall thickening.     For questions or updates, please contact [facility] Please consult [email or web address] for contact info under     Signed, Alvan Carrier, MD  09/07/2023, 8:16 AM

## 2023-09-07 NOTE — Consult Note (Signed)
 [facility] Consultation Oncology  Name: Leslie Williamson      MRN: [id number]    Location: IC07/IC07-01  Date: 09/07/2023 Time:3:52 PM   REFERRING PHYSICIAN: Dr. Vicci  REASON FOR CONSULT: Multiple myeloma and large B-cell lymphoma of the leg type   DIAGNOSIS: CHF/cardiogenic shock  HISTORY OF PRESENT ILLNESS: Leslie Williamson is a 89 year old female who is known to me from office visits.  She has completed 6 cycles of R mini CHOP for large B-cell lymphoma of the leg type of the right leg on 07/20/2023.  She presented to the ER with chest pain and shortness of breath.  Echocardiogram showed LVEF 20%.  CT showed bilateral pleural effusions.  She is on IV Lasix  and milrinone .  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     PAST SURGICAL HISTORY Past Surgical History:  Procedure Laterality Date   CATARACT EXTRACTION W/PHACO Left 11/03/2015   Procedure: CATARACT EXTRACTION PHACO AND INTRAOCULAR LENS PLACEMENT (IOC);  Surgeon: Cherene Mania, MD;  Location: [facility];  Service: Ophthalmology;  Laterality: Left;  CDE: 7.74   CATARACT EXTRACTION W/PHACO Right 11/20/2015   Procedure: CATARACT EXTRACTION PHACO AND  INTRAOCULAR LENS PLACEMENT ; CDE:  8.30;  Surgeon: Cherene Mania, MD;  Location: [facility];  Service: Ophthalmology;  Laterality: Right;   IR IMAGING GUIDED PORT INSERTION  11/04/2021   MASS EXCISION Right 02/18/2023   Procedure: Incisional Biopsy Right Lower Extremity;  Surgeon: Kallie Manuelita BROCKS, MD;  Location: [facility];  Service: General;  Laterality: Right;   PORT-A-CATH REMOVAL      FAMILY HISTORY: Family History  Problem Relation Age of Onset   Diabetes Mother     SOCIAL HISTORY:  reports that she has quit smoking. She has never used smokeless tobacco. She reports that she does not drink alcohol  and does not use drugs.  PERFORMANCE STATUS: The patient's performance status is 2 - Symptomatic, <50% confined to bed  PHYSICAL EXAM: Most Recent Vital Signs: Blood pressure 113/75, pulse 98, temperature 98.2 F (36.8 C), temperature source Oral, resp. rate 18, height 5' (1.524 m), weight 128 lb 4.9 oz (58.2 kg), SpO2 96%. BP 113/75   Pulse 98   Temp 98.2 F (36.8 C) (Oral)   Resp 18   Ht 5' (1.524 m)   Wt 128 lb 4.9 oz (58.2 kg)   SpO2 96%   BMI 25.06 kg/m  General appearance: alert, cooperative, and appears stated age Extremities: 1+ edema  LABORATORY DATA:  Results for orders placed or performed during the hospital encounter of 09/05/23 (from the past 48 hours)  Resp panel by  RT-PCR (RSV, Flu A&B, Covid) Anterior Nasal Swab     Status: None   Collection Time: 09/05/23  4:33 PM   Specimen: Anterior Nasal Swab  Result Value Ref Range   SARS Coronavirus 2 by RT PCR NEGATIVE NEGATIVE    Comment: (NOTE) SARS-CoV-2 target nucleic acids are NOT DETECTED.  The SARS-CoV-2 RNA is generally detectable in upper respiratory specimens during the acute phase of infection. The lowest concentration of SARS-CoV-2 viral copies this assay can detect is 138 copies/mL. A negative result does not preclude SARS-Cov-2 infection and should not be used as the sole basis for treatment or other patient  management decisions. A negative result may occur with  improper specimen collection/handling, submission of specimen other than nasopharyngeal swab, presence of viral mutation(s) within the areas targeted by this assay, and inadequate number of viral copies(<138 copies/mL). A negative result must be combined with clinical observations, patient history, and epidemiological information. The expected result is Negative.  Fact Sheet for Patients:  BloggerCourse.com  Fact Sheet for Healthcare Providers:  SeriousBroker.it  This test is no t yet approved or cleared by the [place]  FDA and  has been authorized for detection and/or diagnosis of SARS-CoV-2 by FDA under an Emergency Use Authorization (EUA). This EUA will remain  in effect (meaning this test can be used) for the duration of the COVID-19 declaration under Section 564(b)(1) of the Act, 21 U.S.C.section 360bbb-3(b)(1), unless the authorization is terminated  or revoked sooner.       Influenza A by PCR NEGATIVE NEGATIVE   Influenza B by PCR NEGATIVE NEGATIVE    Comment: (NOTE) The Xpert Xpress SARS-CoV-2/FLU/RSV plus assay is intended as an aid in the diagnosis of influenza from Nasopharyngeal swab specimens and should not be used as a sole basis for treatment. Nasal washings and aspirates are unacceptable for Xpert Xpress SARS-CoV-2/FLU/RSV testing.  Fact Sheet for Patients: BloggerCourse.com  Fact Sheet for Healthcare Providers: SeriousBroker.it  This test is not yet approved or cleared by the [place]  FDA and has been authorized for detection and/or diagnosis of SARS-CoV-2 by FDA under an Emergency Use Authorization (EUA). This EUA will remain in effect (meaning this test can be used) for the duration of the COVID-19 declaration under Section 564(b)(1) of the Act, 21 U.S.C. section 360bbb-3(b)(1), unless the  authorization is terminated or revoked.     Resp Syncytial Virus by PCR NEGATIVE NEGATIVE    Comment: (NOTE) Fact Sheet for Patients: BloggerCourse.com  Fact Sheet for Healthcare Providers: SeriousBroker.it  This test is not yet approved or cleared by the [place]  FDA and has been authorized for detection and/or diagnosis of SARS-CoV-2 by FDA under an Emergency Use Authorization (EUA). This EUA will remain in effect (meaning this test can be used) for the duration of the COVID-19 declaration under Section 564(b)(1) of the Act, 21 U.S.C. section 360bbb-3(b)(1), unless the authorization is terminated or revoked.  Performed at [facility], [address], [place], [place] [address]   CBC     Status: Abnormal   Collection Time: 09/06/23  5:07 AM  Result Value Ref Range   WBC 4.8 4.0 - 10.5 K/uL   RBC 3.58 (L) 3.87 - 5.11 MIL/uL   Hemoglobin 8.2 (L) 12.0 - 15.0 g/dL    Comment: Reticulocyte Hemoglobin testing may be clinically indicated, consider ordering this additional test [id number]    HCT 26.7 (L) 36.0 - 46.0 %   MCV 74.6 (L) 80.0 - 100.0 fL   MCH 22.9 (L) 26.0 -  34.0 pg   MCHC 30.7 30.0 - 36.0 g/dL   RDW 80.7 (H) 88.4 - 84.4 %   Platelets 328 150 - 400 K/uL   nRBC 7.1 (H) 0.0 - 0.2 %    Comment: Performed at [facility], [address], [place], [place] [address]  Renal function panel     Status: Abnormal   Collection Time: 09/06/23  5:07 AM  Result Value Ref Range   Sodium 143 135 - 145 mmol/L   Potassium 3.7 3.5 - 5.1 mmol/L   Chloride 113 (H) 98 - 111 mmol/L   CO2 20 (L) 22 - 32 mmol/L   Glucose, Bld 91 70 - 99 mg/dL    Comment: Glucose reference range applies only to samples taken after fasting for at least 8 hours.   BUN 55 (H) 8 - 23 mg/dL   Creatinine, Ser 8.26 (H) 0.44 - 1.00 mg/dL   Calcium  8.3 (L) 8.9 - 10.3 mg/dL   Phosphorus 4.5 2.5 - 4.6 mg/dL   Albumin 3.0 (L) 3.5 - 5.0 g/dL   GFR, Estimated 28  (L) >60 mL/min    Comment: (NOTE) Calculated using the CKD-EPI Creatinine Equation (2021)    Anion gap 10 5 - 15    Comment: Performed at [facility], [address] [place], [place] [address]  Hepatic function panel     Status: Abnormal   Collection Time: 09/06/23  8:55 AM  Result Value Ref Range   Total Protein 4.9 (L) 6.5 - 8.1 g/dL   Albumin 3.1 (L) 3.5 - 5.0 g/dL   AST 70 (H) 15 - 41 U/L   ALT 118 (H) 0 - 44 U/L   Alkaline Phosphatase 155 (H) 38 - 126 U/L   Total Bilirubin 1.6 (H) 0.0 - 1.2 mg/dL   Bilirubin, Direct 0.6 (H) 0.0 - 0.2 mg/dL   Indirect Bilirubin 1.0 (H) 0.3 - 0.9 mg/dL    Comment: Performed at [facility], [address], [place], [place] [address]  Lactic acid, plasma     Status: Abnormal   Collection Time: 09/06/23  1:09 PM  Result Value Ref Range   Lactic Acid, Venous 3.2 (HH) 0.5 - 1.9 mmol/L    Comment: CRITICAL RESULT CALLED TO, READ BACK BY AND VERIFIED WITH SMITH,D ON 09/06/23 AT 1459 BY LOY,C Performed at [facility], [address] [place], [place] [address]   Urinalysis, Routine w reflex microscopic -Urine, Clean Catch     Status: Abnormal   Collection Time: 09/06/23  2:00 PM  Result Value Ref Range   Color, Urine YELLOW YELLOW   APPearance CLEAR CLEAR   Specific Gravity, Urine 1.010 1.005 - 1.030   pH 5.0 5.0 - 8.0   Glucose, UA 150 (A) NEGATIVE mg/dL   Hgb urine dipstick NEGATIVE NEGATIVE   Bilirubin Urine NEGATIVE NEGATIVE   Ketones, ur NEGATIVE NEGATIVE mg/dL   Protein, ur NEGATIVE NEGATIVE mg/dL   Nitrite NEGATIVE NEGATIVE   Leukocytes,Ua NEGATIVE NEGATIVE    Comment: Performed at [facility], [address], [place], [place] [address]  Cooxemetry Panel (carboxy, met, total hgb, O2 sat)     Status: Abnormal   Collection Time: 09/06/23  5:30 PM  Result Value Ref Range   Total hemoglobin 9.1 (L) 12.0 - 16.0 g/dL   O2 Saturation 71.6 %   Carboxyhemoglobin 0.3 (L) 0.5 - 1.5 %   Methemoglobin 0.9 0.0 - 1.5 %    Comment: Performed at [facility], [address] [place], [place] [address]  MRSA Next Gen by PCR, Nasal     Status: None   Collection Time: 09/06/23  5:45 PM   Specimen: Nasal Mucosa; Nasal Swab  Result Value Ref Range   MRSA by PCR Next Gen NOT DETECTED NOT DETECTED    Comment: (NOTE) The GeneXpert MRSA Assay (FDA approved for NASAL specimens only), is one component of a comprehensive MRSA colonization surveillance program. It is not intended to diagnose MRSA infection nor to guide or monitor treatment for MRSA infections. Test performance is not FDA approved in patients less than 44 years old. Performed at [facility], [address] [place], [place] [address]   Comprehensive metabolic panel with GFR     Status: Abnormal   Collection Time: 09/07/23  4:20 AM  Result Value Ref Range   Sodium 144 135 - 145 mmol/L   Potassium 3.3 (L) 3.5 - 5.1 mmol/L   Chloride 109 98 - 111 mmol/L   CO2 21 (L) 22 - 32 mmol/L   Glucose, Bld 102 (H) 70 - 99 mg/dL    Comment: Glucose reference range applies only to samples taken after fasting for at least 8 hours.   BUN 46 (H) 8 - 23 mg/dL   Creatinine, Ser 8.41 (H) 0.44 - 1.00 mg/dL   Calcium  8.3 (L) 8.9 - 10.3 mg/dL   Total Protein 4.6 (L) 6.5 - 8.1 g/dL   Albumin 3.0 (L) 3.5 - 5.0 g/dL   AST 52 (H) 15 - 41 U/L   ALT 111 (H) 0 - 44 U/L   Alkaline Phosphatase 145 (H) 38 - 126 U/L   Total Bilirubin 1.1 0.0 - 1.2 mg/dL   GFR, Estimated 31 (L) >60 mL/min    Comment: (NOTE) Calculated using the CKD-EPI Creatinine Equation (2021)    Anion gap 14 5 - 15    Comment: Performed at [facility], [address] [place], [place] [address]  CBC     Status: Abnormal   Collection Time: 09/07/23  4:20 AM  Result Value Ref Range   WBC 5.1 4.0 - 10.5 K/uL   RBC 3.66 (L) 3.87 - 5.11 MIL/uL   Hemoglobin 8.6 (L) 12.0 - 15.0 g/dL    Comment: Reticulocyte Hemoglobin testing may be clinically indicated, consider ordering this additional test [id number]    HCT 26.7 (L) 36.0 - 46.0 %    MCV 73.0 (L) 80.0 - 100.0 fL   MCH 23.5 (L) 26.0 - 34.0 pg   MCHC 32.2 30.0 - 36.0 g/dL   RDW 80.3 (H) 88.4 - 84.4 %   Platelets 343 150 - 400 K/uL   nRBC 9.2 (H) 0.0 - 0.2 %    Comment: Performed at [facility], [address], [place], [place] [address]  Brain natriuretic peptide     Status: Abnormal   Collection Time: 09/07/23  4:20 AM  Result Value Ref Range   B Natriuretic Peptide >4,500.0 (H) 0.0 - 100.0 pg/mL    Comment: Performed at [facility], [address], [place], [place] [address]  Lactic acid, plasma     Status: None   Collection Time: 09/07/23  4:20 AM  Result Value Ref Range   Lactic Acid, Venous 0.9 0.5 - 1.9 mmol/L    Comment: Performed at [facility], [address] [place], [place] [address]  Procalcitonin     Status: None   Collection Time: 09/07/23  4:20 AM  Result Value Ref Range   Procalcitonin 0.23 ng/mL    Comment:  Interpretation: PCT (Procalcitonin) <= 0.5 ng/mL: Systemic infection (sepsis) is not likely. Local bacterial infection is possible. (NOTE)       Sepsis PCT Algorithm           Lower Respiratory Tract                                      Infection PCT Algorithm    ----------------------------     ----------------------------         PCT < 0.25 ng/mL                PCT < 0.10 ng/mL          Strongly encourage             Strongly discourage   discontinuation of antibiotics    initiation of antibiotics    ----------------------------     -----------------------------       PCT 0.25 - 0.50 ng/mL            PCT 0.10 - 0.25 ng/mL               OR       >80% decrease in PCT            Discourage initiation of                                            antibiotics      Encourage discontinuation           of antibiotics    ----------------------------     -----------------------------         PCT >= 0.50 ng/mL              PCT 0.26 - 0.50 ng/mL               AND        <80% decrease in PCT             Encourage initiation of                                              antibiotics       Encourage continuation           of antibiotics    ----------------------------     -----------------------------        PCT >= 0.50 ng/mL                  PCT > 0.50 ng/mL               AND         increase in PCT                  Strongly encourage                                      initiation of antibiotics    Strongly encourage escalation           of antibiotics                                     -----------------------------  PCT <= 0.25 ng/mL                                                 OR                                        > 80% decrease in PCT                                      Discontinue / Do not initiate                                             antibiotics  Performed at [facility], [address], [place], [place] [address]   Cooxemetry Panel (carboxy, met, total hgb, O2 sat)     Status: Abnormal   Collection Time: 09/07/23  4:20 AM  Result Value Ref Range   Total hemoglobin 9.1 (L) 12.0 - 16.0 g/dL   O2 Saturation 55.4 %   Carboxyhemoglobin 0.9 0.5 - 1.5 %   Methemoglobin <0.7 0.0 - 1.5 %    Comment: Performed at [facility], [address], [place], [place] [address]      RADIOGRAPHY: ECHOCARDIOGRAM LIMITED Result Date: 09/06/2023    ECHOCARDIOGRAM LIMITED REPORT   Patient Name:   MAURENE HOLLIN Date of Exam: 09/06/2023 Medical Rec #:  [id number]         Height:       65.0 in Accession #:    [id number]        Weight:       127.9 lb Date of Birth:  1931/06/02         BSA:          1.636 m Patient Age:    89 years          BP:           128/89 mmHg Patient Gender: F                 HR:           91 bpm. Exam Location:  [facility] Procedure: Limited Echo, Cardiac Doppler, Strain Analysis and Intracardiac            Opacification Agent (Both Spectral and Color Flow Doppler were            utilized during procedure). Indications:    Dyspnea R06.00  History:         Patient has prior history of Echocardiogram examinations, most                 recent 03/03/2023. CHF; Risk Factors:Hypertension and                 Dyslipidemia. Hx of COVID-19.  Sonographer:    Aida Pizza RCS Referring Phys: [id number] DORN FALCON BRANCH  Sonographer Comments: Global longitudinal strain was attempted. IMPRESSIONS  1. Left ventricular ejection fraction, by estimation, is 20%. The left ventricle has severely decreased function. The left ventricle demonstrates global hypokinesis. The average left ventricular global longitudinal strain is -  4.2 %. The global longitudinal strain is abnormal.  2. Right ventricular systolic function is normal. The right ventricular size is normal.  3. The inferior vena cava is dilated in size with <50% respiratory variability, suggesting right atrial pressure of 15 mmHg. FINDINGS  Left Ventricle: Left ventricular ejection fraction, by estimation, is 20%. The left ventricle has severely decreased function. The left ventricle demonstrates global hypokinesis. Definity  contrast agent was given IV to delineate the left ventricular endocardial borders. The average left ventricular global longitudinal strain is -4.2 %. Strain was performed and the global longitudinal strain is abnormal. The left ventricular internal cavity size was normal in size. There is no left ventricular hypertrophy. Right Ventricle: The right ventricular size is normal. Right vetricular wall thickness was not well visualized. Right ventricular systolic function is normal. Venous: The inferior vena cava is dilated in size with less than 50% respiratory variability, suggesting right atrial pressure of 15 mmHg. Additional Comments: 3D was performed not requiring image post processing on an independent workstation and was abnormal.  LEFT VENTRICLE PLAX 2D LVIDd:         5.00 cm   Diastology LVIDs:         4.60 cm   LV e' medial:    2.94 cm/s LV PW:         1.00 cm   LV E/e' medial:  28.6 LV IVS:        1.00 cm    LV e' lateral:   7.29 cm/s LVOT diam:     1.70 cm   LV E/e' lateral: 11.6 LVOT Area:     2.27 cm                          2D Longitudinal Strain                          2D Strain GLS Avg:     -4.2 %                           3D Volume EF:                          3D EF:        32 %                          LV EDV:       150 ml                          LV ESV:       101 ml                          LV SV:        49 ml RIGHT VENTRICLE RV S prime:     10.30 cm/s TAPSE (M-mode): 2.1 cm LEFT ATRIUM             Index        RIGHT ATRIUM           Index LA diam:        3.50 cm 2.14 cm/m   RA Area:     19.10 cm LA Vol (A2C):   73.0 ml 44.63 ml/m  RA Volume:  53.30 ml  32.59 ml/m LA Vol (A4C):   43.5 ml 26.59 ml/m LA Biplane Vol: 60.9 ml 37.23 ml/m   AORTA Ao Root diam: 3.30 cm MITRAL VALVE MV Area (PHT): 5.27 cm    SHUNTS MV Decel Time: 144 msec    Systemic Diam: 1.70 cm MV E velocity: 84.20 cm/s MV A velocity: 80.50 cm/s MV E/A ratio:  1.05 Dorn Ross MD Electronically signed by Dorn Ross MD Signature Date/Time: 09/06/2023/5:17:58 PM    Final    CT CHEST ABDOMEN PELVIS WO CONTRAST Result Date: 09/05/2023 CLINICAL DATA:  B-cell lymphoma, recent therapy. Chest pain for 2 days. Nausea and shortness of breath. * Tracking Code: BO * EXAM: CT CHEST, ABDOMEN AND PELVIS WITHOUT CONTRAST TECHNIQUE: Multidetector CT imaging of the chest, abdomen and pelvis was performed following the standard protocol without IV contrast. RADIATION DOSE REDUCTION: This exam was performed according to the departmental dose-optimization program which includes automated exposure control, adjustment of the mA and/or kV according to patient size and/or use of iterative reconstruction technique. COMPARISON:  PET-CT 08/11/2023 FINDINGS: CT CHEST FINDINGS Cardiovascular: Right Port-A-Cath tip: Right atrium. Coronary, aortic arch, and branch vessel atherosclerotic vascular disease. Moderate cardiomegaly. Mediastinum/Nodes: Unremarkable  Lungs/Pleura: Moderate right and small left pleural effusion. Associated passive atelectasis in addition to mild atelectasis posteriorly in the right middle lobe and left upper lobe Musculoskeletal: Thoracic kyphosis and spondylosis. CT ABDOMEN PELVIS FINDINGS Hepatobiliary: Cholecystectomy.  Otherwise unremarkable. Pancreas: Unremarkable Spleen: Splenectomy. Adrenals/Urinary Tract: Small left adrenal adenoma unchanged. No other significant findings. Stomach/Bowel: Sigmoid colon diverticulosis. Scattered diverticula in the ascending colon. No active diverticulitis is identified. No dilated bowel. Vascular/Lymphatic: Atherosclerosis is present, including aortoiliac atherosclerotic disease. No pathologic adenopathy identified. Reproductive: Uterus absent Other: Small collection of fluid along the right paracolic gutter unchanged and not previously hypermetabolic. Musculoskeletal: 7 mm degenerative anterolisthesis at L3-4. Multilevel lumbar degenerative facet arthropathy. IMPRESSION: 1. Moderate right and small left pleural effusions with associated passive atelectasis. 2. Moderate cardiomegaly. 3. Small left adrenal adenoma unchanged. 4. Colonic diverticulosis. 5. 7 mm degenerative anterolisthesis at L3-4. Multilevel lumbar degenerative facet arthropathy. 6.  Aortic Atherosclerosis (ICD10-I70.0). Electronically Signed   By: Ryan Salvage M.D.   On: 09/05/2023 16:53        ASSESSMENT and PLAN:  1.  Acute CHF/cardiogenic shock: - Presentation with chest pain and shortness of breath, with fluid retention. - Latest 2D echo shows LVEF 20%, previous echo in January showed LVEF 50-55%. - Likely from Adriamycin  in the lymphoma or treatment although dose reduced. - She is on IV Lasix  and inotropic support with milrinone . - She is a poor candidate for catheterization from advanced age and other oncological conditions including multiple myeloma and large B-cell lymphoma of the leg type. - May continue aggressive  medical management. - Discussed with Dr. Vicci.  2.  IgG lambda multiple myeloma: - Last myeloma panel on 08/18/2023 with M spike 0.1 g and stable. - FLC ratio is also stable at 3.5. - Revlimid  is on hold during the hospitalization.  3.  Diffuse large B-cell lymphoma of the leg type, right leg: - Completed 6 cycles of R mini CHOP in May. - She is being planned for ISRT of the leg.  All questions were answered. The patient knows to call the clinic with any problems, questions or concerns. We can certainly see the patient much sooner if necessary.    Rachid Parham

## 2023-09-07 NOTE — Plan of Care (Signed)

## 2023-09-07 NOTE — TOC Initial Note (Signed)
 Transition of Care [facility]) - Initial/Assessment Note    Patient Details  Name: Leslie Williamson MRN: [id number] Date of Birth: 05-07-1931  Transition of Care [facility]) CM/SW Contact:    Mcarthur Saddie Kim, LCSW Phone Number: 09/07/2023, 8:37 AM  Clinical Narrative: Pt admitted with acute on chronic diastolic CHF. Assessment completed due to high risk readmission score. Pt lives with her son and is fairly independent with ADLs. No current home health services. Plan is for return home when medically stable. TOC will follow.                   Expected Discharge Plan: Home/Self Care Barriers to Discharge: Continued Medical Work up   Patient Goals and CMS Choice Patient states their goals for this hospitalization and ongoing recovery are:: return home   Choice offered to / list presented to : Adult Children [place] ownership interest in [facility].provided to::  (n/a)    Expected Discharge Plan and Services In-house Referral: Clinical Social Work     Living arrangements for the past 2 months: Single Family Home                                      Prior Living Arrangements/Services Living arrangements for the past 2 months: Single Family Home   Patient language and need for interpreter reviewed:: Yes Do you feel safe going back to the place where you live?: Yes        Care giver support system in place?: Yes (comment) Current home services: DME Criminal Activity/Legal Involvement Pertinent to Current Situation/Hospitalization: No - Comment as needed  Activities of Daily Living   ADL Screening (condition at time of admission) Independently performs ADLs?: Yes (appropriate for developmental age) Is the patient deaf or have difficulty hearing?: No Does the patient have difficulty seeing, even when wearing glasses/contacts?: No Does the patient have difficulty concentrating, remembering, or making decisions?: No  Permission Sought/Granted                   Emotional Assessment         Alcohol  / Substance Use: Not Applicable Psych Involvement: No (comment)  Admission diagnosis:  Acute exacerbation of CHF (congestive heart failure) (HCC) [I50.9] AKI (acute kidney injury) (HCC) [N17.9] Chest pain, unspecified type [R07.9] Acute on chronic congestive heart failure, unspecified heart failure type (HCC) [I50.9] Patient Active Problem List   Diagnosis Date Noted   Acute exacerbation of CHF (congestive heart failure) (HCC) 09/05/2023   Abnormal gastrointestinal positron emission tomography (PET) scan 08/25/2023   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) 03/01/2023   Mass of right lower leg 02/08/2023   Malignant neoplasm of lymphoid, hematopoietic and related tissue, unspecified (HCC) 02/08/2023   Acute pulmonary embolism (HCC) 07/03/2022   DNR (do not resuscitate) 07/02/2022   HFrEF 06/03/2022   CHF (congestive heart failure) (HCC) 05/31/2022   Drug-induced neutropenia (HCC) 01/20/2022   Multiple myeloma (HCC) 11/16/2021   Acute renal failure superimposed on stage 3a chronic kidney disease (HCC) 08/06/2021   Essential thrombocytosis (HCC) 08/06/2021   MGUS (monoclonal gammopathy of unknown significance) 08/06/2021   COVID-19 virus infection 08/05/2021   Acute respiratory failure with hypoxia (HCC) 08/05/2021   Occult blood positive stool 10/23/2020   Labyrinthine vertigo 10/26/2015   Elevated troponin 10/25/2015   Chest pain 10/25/2015   Vertigo    S/P splenectomy 01/01/2014  Claudication of both lower extremities (HCC) 11/06/2013   NHL (non-Hodgkin's lymphoma) (HCC) 12/17/2010   WEIGHT LOSS, ABNORMAL 02/05/2009   History of colonic polyps 02/05/2009   GERD 08/08/2008   Iron  deficiency anemia 06/12/2008   Anxiety state 03/20/2007   Hyperlipidemia 01/11/2006   JAK2+ MDP (myeloproliferative disorder) presenting with thrombocytosis (HCC) 01/11/2006   CARPAL TUNNEL SYNDROME 01/11/2006    Essential hypertension 01/11/2006   PVD- know Lt SFA disease, bilat PT and AT disease 01/11/2006   Allergic rhinitis 01/11/2006   SEBORRHEIC KERATOSIS 01/11/2006   PCP:  Carlette Benita Area, MD Pharmacy:   [facility] - [place], [place] - [address] [place] [address] Phone: [phone] Fax: [phone]  [facility] [place], [place] - [address] [facility] [address] [place] [address] Phone: [phone] Fax: [phone]  [facility] [address] [place] [address] Phone: [phone] Fax: [phone]  [facility] - [address] [place] - [address] [place] [address] Phone: [phone] Fax: [phone]     Social Drivers of Health (SDOH) Social History: SDOH Screenings   Food Insecurity: No Food Insecurity (09/05/2023)  Housing: Low Risk  (09/05/2023)  Transportation Needs: No Transportation Needs (09/05/2023)  Utilities: Not At Risk (09/05/2023)  Alcohol  Screen: Low Risk  (12/31/2019)  Depression (PHQ2-9): Low Risk  (08/19/2023)  Financial Resource Strain: Low Risk  (12/31/2019)  Physical Activity: Inactive (12/31/2019)  Social Connections: Moderately Isolated (09/05/2023)  Stress: No Stress Concern Present (12/31/2019)  Tobacco Use: Medium Risk (09/06/2023)   SDOH Interventions: Food Insecurity Interventions: Intervention Not Indicated Housing Interventions: Intervention Not Indicated Transportation Interventions: Intervention Not Indicated Utilities Interventions: Intervention Not Indicated Social Connections Interventions: Intervention Not Indicated   Readmission Risk Interventions    09/07/2023    8:35 AM 07/05/2022    1:10 PM 07/05/2022    1:09 PM  Readmission Risk Prevention Plan  Transportation Screening Complete  Complete  PCP or Specialist Appt within 3-5 Days  Complete   Home Care Screening Complete    Medication Review (RN CM)  Complete    HRI or Home Care Consult  Complete   Social Work Consult for Recovery Care Planning/Counseling  Complete   Palliative Care Screening  Not Applicable   Medication Review Oceanographer)  Complete

## 2023-09-07 NOTE — Progress Notes (Signed)
 Palliative:  Mrs. Chacko is sitting up in the bed in the intensive care.  She continues to appear acutely/chronically ill and frail.  She greets me, making and somewhat keeping eye contact.  She is alert, oriented to person place, situation.  I do believe that she can make her needs known.  Her son, [name], is present at bedside.  We talk in detail about the acute changes overnight with her heart.  We talked about cardiogenic shock, a weak heart, severe heart failure.  We talked about medications used to treat for heart failure.  During our conversation, Mrs. Bowerman states that she feels fine.  I shared that this is because she is having IV medications to help sustain her heart.  We talked about the treatment plan in detail and I share that the medical team is doing their part, she is doing her part. I shared that we are requesting for Dr. Rogers to weigh in on his opinion about her strength and cancer.  I shared that she must be strong enough to take any further cancer treatment.  Mrs. Crandell also questions this questioning how Dr. MARLA could know whether she could take treatments or not.  We talked about guidelines for care. At the end of our conversation, she politely states, I do not think you know what you are talking about, I believe, because she feels that she is okay.  Mrs. Scioneaux states that she, at this point wants to, feels like she could get up, put on her clothes, and  go home.  We talked about returning home if and when stable.  We also talked about rehospitalization, when she gets sick again, would she come back to the hospital.  We talked about preferred place of death (to which she makes no comment).  I shared that some people come into the hospital and are too sick to ever leave.  Conference with attending, cardiology MD/PA, bedside nursing staff, transition of care team related to patient condition, needs, goals of care.  Plan: Continue to treat the treatable but no CPR or  intubation.  Time for outcomes.  PMT to follow.   50 minutes  Lorenza Birkenhead, NP Palliative Medicine Team  Team Phone [phone]

## 2023-09-07 NOTE — Telephone Encounter (Signed)
 Pt's son Lynwood (on dpr) informed of providers message. Verbalized understanding.

## 2023-09-07 NOTE — Progress Notes (Addendum)
 PROGRESS NOTE   Leslie Williamson  FMW:[id number] DOB: [birth year] DOA: 09/05/2023 PCP: Carlette Benita Area, MD   Chief Complaint  Patient presents with   Chest Pain   Level of care: ICU  Brief Admission History:   89 y.o. female with medical history significant for chronic diastolic dysfunction CHF, HTN, small B-cell lymphoma of the spleen/non-Hodgkin's lymphoma, PAD, essential thrombocytosis, JAK2 plus myelo proliferative disorder, MGUS and GERD Presents with son [name] at bedside with complaints of chest discomfort increasing shortness of breath and nausea for the last couple days patient apparently was dealing with URI symptoms-she was given promethazine for URI symptoms and cough patient became very sedated from the promethazine for the last couple days so she stopped taking all her medications including cardiac meds.   She is admitted to ICU for acute HFrEF on IV milrinone  infusion.     Assessment and Plan:  Acute HFrEF  Cardiogenic Shock -- pt has a marked decline in LVEF, 7/25 TTE: LVEF 20%  -- cardiology team started patient on IV milrinone  infusion and IV lasix  -- she is diuresing slowly and clinically feels improved -- BNP>4500 -- she remains on IV lasix  dose increased by cards to 80 mg BID -- unable to get ReDs vest reading as they do not have the equipment today -- discussed with oncologist Dr MARLA who knows patient well, he feels her cancers are well controlled now, myeloma has been quiescent and being followed by him, says she likely developed heart failure from adriamycin  therapy which is now completed (although she received reduced dose adriamycin ) and now to complete remainder of lymphoma treatment with radiation therapy to the leg -- he recommended continue aggressive medical management of heart failure but would avoid catheterization  Filed Weights   09/06/23 0415 09/06/23 1752 09/07/23 0415  Weight: 58 kg 57.1 kg 58.2 kg    Intake/Output Summary (Last 24 hours) at  09/07/2023 1312 Last data filed at 09/07/2023 1242 Gross per 24 hour  Intake 437.5 ml  Output 900 ml  Net -462.5 ml   AKI -- monitoring creatinine closely in setting of diuresis -- some improvement in creatinine with diuresis   Transaminitis -- suspect secondary to hepatic congestion from heart failure -- following LFTs  H/o PE -- remains on apixaban    Multiple Myeloma  Non-Hodgkins Lymphoma  -- she is followed by Dr. Rogers -- completed 6 cycles of R mini CHOP -- PET scan 6/25: Similar size and mild increase in hypermetabolic involving soft tissue mass in the right lower extremity, [place] 4. New gastric fundal hypermetabolism with persistent wall thickening  -- oncology consult to help guide prognosis in regards to heart failure and need for aggressive treatments  -- discussed with oncologist Dr MARLA who knows patient well, he feels her cancers are well controlled now, myeloma has been quiescent and being followed by him, says she likely developed heart failure from adriamycin  therapy which is now completed (although she received reduced dose adriamycin ) and now to complete remainder of lymphoma treatment with radiation therapy to the leg -- he recommended continue aggressive medical management of heart failure but would avoid catheterization  DVT prophylaxis: apixaban   Code Status: DNR  Communication: updated pt at bedside, verbalized understanding Disposition: goal to return home with [facility]   Consultants:  Cardiology oncology  Procedures:   Antimicrobials:    Subjective: Pt reports that wants to eat more solid foods, says she will get sick on stomach if not, otherwise feels ok today.  She still  has SOB.   Objective: Vitals:   09/07/23 1000 09/07/23 1030 09/07/23 1118 09/07/23 1208  BP: (!) 100/45   91/67  Pulse: 100     Resp: 15 (!) 22  (!) 21  Temp:   98.2 F (36.8 C)   TempSrc:   Oral   SpO2: 94%     Weight:      Height:        Intake/Output Summary (Last  24 hours) at 09/07/2023 1308 Last data filed at 09/07/2023 1242 Gross per 24 hour  Intake 437.5 ml  Output 900 ml  Net -462.5 ml   Filed Weights   09/06/23 0415 09/06/23 1752 09/07/23 0415  Weight: 58 kg 57.1 kg 58.2 kg   Examination:  General exam: Appears calm and comfortable NAD Respiratory system: bibasilar crackles. Respiratory effort normal. Cardiovascular system: normal S1 & S2 heard. No JVD, murmurs, rubs, gallops or clicks. 1+ pedal edema. Gastrointestinal system: Abdomen is nondistended, soft and nontender. No organomegaly or masses felt. Normal bowel sounds heard. Central nervous system: Alert and oriented. No focal neurological deficits. Extremities: 1+ edema bilateral. Skin: No rashes, lesions or ulcers. Psychiatry: Judgement and insight appear normal. Mood & affect appropriate.   Data Reviewed: I have personally reviewed following labs and imaging studies  CBC: Recent Labs  Lab 09/05/23 1345 09/06/23 0507 09/07/23 0420  WBC 5.6 4.8 5.1  NEUTROABS 3.3  --   --   HGB 9.0* 8.2* 8.6*  HCT 28.3* 26.7* 26.7*  MCV 74.9* 74.6* 73.0*  PLT 363 328 343    Basic Metabolic Panel: Recent Labs  Lab 09/05/23 1345 09/05/23 1410 09/06/23 0507 09/07/23 0420  NA 140  --  143 144  K 4.4  --  3.7 3.3*  CL 108  --  113* 109  CO2 16*  --  20* 21*  GLUCOSE 115*  --  91 102*  BUN 53*  --  55* 46*  CREATININE 1.91*  --  1.73* 1.58*  CALCIUM  8.7*  --  8.3* 8.3*  MG  --  2.1  --   --   PHOS  --   --  4.5  --     CBG: No results for input(s): GLUCAP in the last 168 hours.  Recent Results (from the past 240 hours)  Resp panel by RT-PCR (RSV, Flu A&B, Covid) Anterior Nasal Swab     Status: None   Collection Time: 09/05/23  4:33 PM   Specimen: Anterior Nasal Swab  Result Value Ref Range Status   SARS Coronavirus 2 by RT PCR NEGATIVE NEGATIVE Final    Comment: (NOTE) SARS-CoV-2 target nucleic acids are NOT DETECTED.  The SARS-CoV-2 RNA is generally detectable in upper  respiratory specimens during the acute phase of infection. The lowest concentration of SARS-CoV-2 viral copies this assay can detect is 138 copies/mL. A negative result does not preclude SARS-Cov-2 infection and should not be used as the sole basis for treatment or other patient management decisions. A negative result may occur with  improper specimen collection/handling, submission of specimen other than nasopharyngeal swab, presence of viral mutation(s) within the areas targeted by this assay, and inadequate number of viral copies(<138 copies/mL). A negative result must be combined with clinical observations, patient history, and epidemiological information. The expected result is Negative.  Fact Sheet for Patients:  BloggerCourse.com  Fact Sheet for Healthcare Providers:  SeriousBroker.it  This test is no t yet approved or cleared by the [place]  FDA and  has been authorized for  detection and/or diagnosis of SARS-CoV-2 by FDA under an Emergency Use Authorization (EUA). This EUA will remain  in effect (meaning this test can be used) for the duration of the COVID-19 declaration under Section 564(b)(1) of the Act, 21 U.S.C.section 360bbb-3(b)(1), unless the authorization is terminated  or revoked sooner.       Influenza A by PCR NEGATIVE NEGATIVE Final   Influenza B by PCR NEGATIVE NEGATIVE Final    Comment: (NOTE) The Xpert Xpress SARS-CoV-2/FLU/RSV plus assay is intended as an aid in the diagnosis of influenza from Nasopharyngeal swab specimens and should not be used as a sole basis for treatment. Nasal washings and aspirates are unacceptable for Xpert Xpress SARS-CoV-2/FLU/RSV testing.  Fact Sheet for Patients: BloggerCourse.com  Fact Sheet for Healthcare Providers: SeriousBroker.it  This test is not yet approved or cleared by the [place]  FDA and has been  authorized for detection and/or diagnosis of SARS-CoV-2 by FDA under an Emergency Use Authorization (EUA). This EUA will remain in effect (meaning this test can be used) for the duration of the COVID-19 declaration under Section 564(b)(1) of the Act, 21 U.S.C. section 360bbb-3(b)(1), unless the authorization is terminated or revoked.     Resp Syncytial Virus by PCR NEGATIVE NEGATIVE Final    Comment: (NOTE) Fact Sheet for Patients: BloggerCourse.com  Fact Sheet for Healthcare Providers: SeriousBroker.it  This test is not yet approved or cleared by the [place]  FDA and has been authorized for detection and/or diagnosis of SARS-CoV-2 by FDA under an Emergency Use Authorization (EUA). This EUA will remain in effect (meaning this test can be used) for the duration of the COVID-19 declaration under Section 564(b)(1) of the Act, 21 U.S.C. section 360bbb-3(b)(1), unless the authorization is terminated or revoked.  Performed at [facility], [address], [place], [place] [address]   MRSA Next Gen by PCR, Nasal     Status: None   Collection Time: 09/06/23  5:45 PM   Specimen: Nasal Mucosa; Nasal Swab  Result Value Ref Range Status   MRSA by PCR Next Gen NOT DETECTED NOT DETECTED Final    Comment: (NOTE) The GeneXpert MRSA Assay (FDA approved for NASAL specimens only), is one component of a comprehensive MRSA colonization surveillance program. It is not intended to diagnose MRSA infection nor to guide or monitor treatment for MRSA infections. Test performance is not FDA approved in patients less than 5 years old. Performed at [facility], [address] [place], [place] [address]      Radiology Studies: ECHOCARDIOGRAM LIMITED Result Date: 09/06/2023    ECHOCARDIOGRAM LIMITED REPORT   Patient Name:   TAISLEY MORDAN Date of Exam: 09/06/2023 Medical Rec #:  [id number]         Height:       65.0 in Accession #:    [id number]         Weight:       127.9 lb Date of Birth:  10-28-1931         BSA:          1.636 m Patient Age:    89 years          BP:           128/89 mmHg Patient Gender: F                 HR:           91 bpm. Exam Location:  [facility] Procedure: Limited Echo, Cardiac Doppler, Strain Analysis and Intracardiac  Opacification Agent (Both Spectral and Color Flow Doppler were            utilized during procedure). Indications:    Dyspnea R06.00  History:        Patient has prior history of Echocardiogram examinations, most                 recent 03/03/2023. CHF; Risk Factors:Hypertension and                 Dyslipidemia. Hx of COVID-19.  Sonographer:    Aida Pizza RCS Referring Phys: [id number] DORN FALCON BRANCH  Sonographer Comments: Global longitudinal strain was attempted. IMPRESSIONS  1. Left ventricular ejection fraction, by estimation, is 20%. The left ventricle has severely decreased function. The left ventricle demonstrates global hypokinesis. The average left ventricular global longitudinal strain is -4.2 %. The global longitudinal strain is abnormal.  2. Right ventricular systolic function is normal. The right ventricular size is normal.  3. The inferior vena cava is dilated in size with <50% respiratory variability, suggesting right atrial pressure of 15 mmHg. FINDINGS  Left Ventricle: Left ventricular ejection fraction, by estimation, is 20%. The left ventricle has severely decreased function. The left ventricle demonstrates global hypokinesis. Definity  contrast agent was given IV to delineate the left ventricular endocardial borders. The average left ventricular global longitudinal strain is -4.2 %. Strain was performed and the global longitudinal strain is abnormal. The left ventricular internal cavity size was normal in size. There is no left ventricular hypertrophy. Right Ventricle: The right ventricular size is normal. Right vetricular wall thickness was not well visualized. Right ventricular systolic  function is normal. Venous: The inferior vena cava is dilated in size with less than 50% respiratory variability, suggesting right atrial pressure of 15 mmHg. Additional Comments: 3D was performed not requiring image post processing on an independent workstation and was abnormal.  LEFT VENTRICLE PLAX 2D LVIDd:         5.00 cm   Diastology LVIDs:         4.60 cm   LV e' medial:    2.94 cm/s LV PW:         1.00 cm   LV E/e' medial:  28.6 LV IVS:        1.00 cm   LV e' lateral:   7.29 cm/s LVOT diam:     1.70 cm   LV E/e' lateral: 11.6 LVOT Area:     2.27 cm                          2D Longitudinal Strain                          2D Strain GLS Avg:     -4.2 %                           3D Volume EF:                          3D EF:        32 %                          LV EDV:       150 ml  LV ESV:       101 ml                          LV SV:        49 ml RIGHT VENTRICLE RV S prime:     10.30 cm/s TAPSE (M-mode): 2.1 cm LEFT ATRIUM             Index        RIGHT ATRIUM           Index LA diam:        3.50 cm 2.14 cm/m   RA Area:     19.10 cm LA Vol (A2C):   73.0 ml 44.63 ml/m  RA Volume:   53.30 ml  32.59 ml/m LA Vol (A4C):   43.5 ml 26.59 ml/m LA Biplane Vol: 60.9 ml 37.23 ml/m   AORTA Ao Root diam: 3.30 cm MITRAL VALVE MV Area (PHT): 5.27 cm    SHUNTS MV Decel Time: 144 msec    Systemic Diam: 1.70 cm MV E velocity: 84.20 cm/s MV A velocity: 80.50 cm/s MV E/A ratio:  1.05 Dorn Ross MD Electronically signed by Dorn Ross MD Signature Date/Time: 09/06/2023/5:17:58 PM    Final    CT CHEST ABDOMEN PELVIS WO CONTRAST Result Date: 09/05/2023 CLINICAL DATA:  B-cell lymphoma, recent therapy. Chest pain for 2 days. Nausea and shortness of breath. * Tracking Code: BO * EXAM: CT CHEST, ABDOMEN AND PELVIS WITHOUT CONTRAST TECHNIQUE: Multidetector CT imaging of the chest, abdomen and pelvis was performed following the standard protocol without IV contrast. RADIATION DOSE REDUCTION: This  exam was performed according to the departmental dose-optimization program which includes automated exposure control, adjustment of the mA and/or kV according to patient size and/or use of iterative reconstruction technique. COMPARISON:  PET-CT 08/11/2023 FINDINGS: CT CHEST FINDINGS Cardiovascular: Right Port-A-Cath tip: Right atrium. Coronary, aortic arch, and branch vessel atherosclerotic vascular disease. Moderate cardiomegaly. Mediastinum/Nodes: Unremarkable Lungs/Pleura: Moderate right and small left pleural effusion. Associated passive atelectasis in addition to mild atelectasis posteriorly in the right middle lobe and left upper lobe Musculoskeletal: Thoracic kyphosis and spondylosis. CT ABDOMEN PELVIS FINDINGS Hepatobiliary: Cholecystectomy.  Otherwise unremarkable. Pancreas: Unremarkable Spleen: Splenectomy. Adrenals/Urinary Tract: Small left adrenal adenoma unchanged. No other significant findings. Stomach/Bowel: Sigmoid colon diverticulosis. Scattered diverticula in the ascending colon. No active diverticulitis is identified. No dilated bowel. Vascular/Lymphatic: Atherosclerosis is present, including aortoiliac atherosclerotic disease. No pathologic adenopathy identified. Reproductive: Uterus absent Other: Small collection of fluid along the right paracolic gutter unchanged and not previously hypermetabolic. Musculoskeletal: 7 mm degenerative anterolisthesis at L3-4. Multilevel lumbar degenerative facet arthropathy. IMPRESSION: 1. Moderate right and small left pleural effusions with associated passive atelectasis. 2. Moderate cardiomegaly. 3. Small left adrenal adenoma unchanged. 4. Colonic diverticulosis. 5. 7 mm degenerative anterolisthesis at L3-4. Multilevel lumbar degenerative facet arthropathy. 6.  Aortic Atherosclerosis (ICD10-I70.0). Electronically Signed   By: Ryan Salvage M.D.   On: 09/05/2023 16:53   DG Chest 2 View Result Date: 09/05/2023 CLINICAL DATA:  Chest pain history of B-cell  lymphoma EXAM: CHEST - 2 VIEW COMPARISON:  X-ray 05/24/2023. FINDINGS: Underinflation. Enlarged cardiopericardial silhouette with calcified aorta. New small pleural effusions adjacent lung opacities. Recommend follow-up. No pneumothorax or edema. Right IJ chest port with tip overlying the upper right atrium. Overlapping cardiac leads. Surgical clips in the upper abdomen. Diffuse degenerative changes. IMPRESSION: Underinflation with small pleural effusions and adjacent opacities. Recommend follow-up. Chest port. Enlarged cardiopericardial silhouette. Electronically  Signed   By: Ranell Bring M.D.   On: 09/05/2023 13:49    Scheduled Meds:  acyclovir   400 mg Oral BID   allopurinol   300 mg Oral Daily   apixaban   5 mg Oral BID   Chlorhexidine  Gluconate Cloth  6 each Topical Daily   furosemide   80 mg Intravenous BID   sodium chloride  flush  3 mL Intravenous Q12H   Continuous Infusions:  milrinone  0.3736 mcg/kg/min (09/07/23 1129)     LOS: 2 days   Critical Care Procedure Note Authorized and Performed by: KYM Louder MD  Total Critical Care time:  55 mins Due to a high probability of clinically significant, life threatening deterioration, the patient required my highest level of preparedness to intervene emergently and I personally spent this critical care time directly and personally managing the patient.  This critical care time included obtaining a history; examining the patient, pulse oximetry; ordering and review of studies; arranging urgent treatment with development of a management plan; evaluation of patient's response of treatment; frequent reassessment; and discussions with other providers.  This critical care time was performed to assess and manage the high probability of imminent and life threatening deterioration that could result in multi-organ failure.  It was exclusive of separately billable procedures and treating other patients and teaching time.    Afton Louder, MD How to  contact the [facility] Attending or Consulting provider 7A - 7P or covering provider during after hours 7P -7A, for this patient?  Check the care team in [facility] and look for a) attending/consulting [facility] provider listed and b) the [facility] team listed Log into [email or web address] to find provider on call.  Locate the [facility] provider you are looking for under [facility] and page to a number that you can be directly reached. If you still have difficulty reaching the provider, please page the [facility] (Director on Call) for the Hospitalists listed on amion for assistance.  09/07/2023, 1:08 PM

## 2023-09-08 ENCOUNTER — Encounter: Payer: Self-pay | Admitting: Hematology

## 2023-09-08 DIAGNOSIS — I427 Cardiomyopathy due to drug and external agent: Secondary | ICD-10-CM | POA: Diagnosis not present

## 2023-09-08 DIAGNOSIS — D473 Essential (hemorrhagic) thrombocythemia: Secondary | ICD-10-CM | POA: Diagnosis not present

## 2023-09-08 DIAGNOSIS — R570 Cardiogenic shock: Secondary | ICD-10-CM | POA: Diagnosis not present

## 2023-09-08 DIAGNOSIS — I471 Supraventricular tachycardia, unspecified: Secondary | ICD-10-CM

## 2023-09-08 DIAGNOSIS — C8335 Diffuse large B-cell lymphoma, lymph nodes of inguinal region and lower limb: Secondary | ICD-10-CM | POA: Diagnosis not present

## 2023-09-08 DIAGNOSIS — T451X5A Adverse effect of antineoplastic and immunosuppressive drugs, initial encounter: Secondary | ICD-10-CM | POA: Diagnosis not present

## 2023-09-08 DIAGNOSIS — I5021 Acute systolic (congestive) heart failure: Secondary | ICD-10-CM | POA: Diagnosis not present

## 2023-09-08 DIAGNOSIS — R2241 Localized swelling, mass and lump, right lower limb: Secondary | ICD-10-CM | POA: Diagnosis not present

## 2023-09-08 DIAGNOSIS — I5023 Acute on chronic systolic (congestive) heart failure: Secondary | ICD-10-CM | POA: Diagnosis not present

## 2023-09-08 LAB — COMPREHENSIVE METABOLIC PANEL WITH GFR
ALT: 85 U/L — ABNORMAL HIGH (ref 0–44)
AST: 31 U/L (ref 15–41)
Albumin: 2.7 g/dL — ABNORMAL LOW (ref 3.5–5.0)
Alkaline Phosphatase: 120 U/L (ref 38–126)
Anion gap: 8 (ref 5–15)
BUN: 32 mg/dL — ABNORMAL HIGH (ref 8–23)
CO2: 22 mmol/L (ref 22–32)
Calcium: 8.3 mg/dL — ABNORMAL LOW (ref 8.9–10.3)
Chloride: 113 mmol/L — ABNORMAL HIGH (ref 98–111)
Creatinine, Ser: 1.27 mg/dL — ABNORMAL HIGH (ref 0.44–1.00)
GFR, Estimated: 40 mL/min — ABNORMAL LOW (ref >60)
Glucose, Bld: 101 mg/dL — ABNORMAL HIGH (ref 70–99)
Potassium: 3.9 mmol/L (ref 3.5–5.1)
Sodium: 143 mmol/L (ref 135–145)
Total Bilirubin: 0.9 mg/dL (ref 0.0–1.2)
Total Protein: 4.4 g/dL — ABNORMAL LOW (ref 6.5–8.1)

## 2023-09-08 LAB — CBC
HCT: 25.6 % — ABNORMAL LOW (ref 36.0–46.0)
Hemoglobin: 8 g/dL — ABNORMAL LOW (ref 12.0–15.0)
MCH: 23.1 pg — ABNORMAL LOW (ref 26.0–34.0)
MCHC: 31.3 g/dL (ref 30.0–36.0)
MCV: 73.8 fL — ABNORMAL LOW (ref 80.0–100.0)
Platelets: 331 K/uL (ref 150–400)
RBC: 3.47 MIL/uL — ABNORMAL LOW (ref 3.87–5.11)
RDW: 19.3 % — ABNORMAL HIGH (ref 11.5–15.5)
WBC: 5 K/uL (ref 4.0–10.5)
nRBC: 9.6 % — ABNORMAL HIGH (ref 0.0–0.2)

## 2023-09-08 LAB — COOXEMETRY PANEL
Carboxyhemoglobin: 1.3 % (ref 0.5–1.5)
Methemoglobin: <0.7 % (ref 0.0–1.5)
O2 Saturation: 61.6 %
Total hemoglobin: 8.6 g/dL — ABNORMAL LOW (ref 12.0–16.0)

## 2023-09-08 LAB — LACTIC ACID, PLASMA
Lactic Acid, Venous: 1 mmol/L (ref 0.5–1.9)
Lactic Acid, Venous: 0.9 mmol/L (ref 0.5–1.9)

## 2023-09-08 LAB — MAGNESIUM: Magnesium: 1.7 mg/dL (ref 1.7–2.4)

## 2023-09-08 MED ORDER — POTASSIUM CHLORIDE CRYS ER 20 MEQ PO TBCR
40.0000 meq | EXTENDED_RELEASE_TABLET | Freq: Once | ORAL | Status: AC
  Administered 2023-09-08: 40 meq via ORAL
  Filled 2023-09-08: qty 2

## 2023-09-08 MED ORDER — FUROSEMIDE 10 MG/ML IJ SOLN
80.0000 mg | Freq: Three times a day (TID) | INTRAMUSCULAR | Status: AC
  Administered 2023-09-08 (×3): 80 mg via INTRAVENOUS
  Filled 2023-09-08 (×3): qty 8

## 2023-09-08 MED ORDER — MAGNESIUM SULFATE 2 GM/50ML IV SOLN
2.0000 g | Freq: Once | INTRAVENOUS | Status: AC
  Administered 2023-09-08: 2 g via INTRAVENOUS
  Filled 2023-09-08: qty 50

## 2023-09-08 NOTE — Progress Notes (Signed)
 Palliative: Leslie Williamson is to be transferred to [facility] for further evaluation and treatment by the heart failure team.   PMT service to follow her there.  No charge Lorenza Birkenhead, NP Palliative medicine team Team phone [phone]

## 2023-09-08 NOTE — Progress Notes (Signed)
 PROGRESS NOTE   Leslie Williamson  FMW:[id number] DOB: [birth year] DOA: 09/05/2023 PCP: Carlette Benita Area, MD   Chief Complaint  Patient presents with   Chest Pain   Level of care: ICU  Brief Admission History:   89 y.o. female with medical history significant for chronic diastolic dysfunction CHF, HTN, small B-cell lymphoma of the spleen/non-Hodgkin's lymphoma, PAD, essential thrombocytosis, JAK2 plus myelo proliferative disorder, MGUS and GERD Presents with son [name] at bedside with complaints of chest discomfort increasing shortness of breath and nausea for the last couple days patient apparently was dealing with URI symptoms-she was given promethazine for URI symptoms and cough patient became very sedated from the promethazine for the last couple days so she stopped taking all her medications including cardiac meds.   She is admitted to ICU for acute HFrEF on IV milrinone  infusion.     Assessment and Plan:  Acute HFrEF  Cardiogenic Shock -- pt has a marked decline in LVEF, 7/25 TTE: LVEF 20% likely from adriamycin  given for chemotherapy -- cardiology team started patient on IV milrinone  infusion and IV lasix  -- she is diuresing slowly and clinically feels improved -- BNP>4500 -- she remains on IV lasix  per cardiology service.  Dr. Alvan requesting transfer to [facility] for Heart Failure team consultation  -- ReDs vest reading 21 -- discussed with oncologist Dr MARLA who knows patient well, he feels her cancers are well controlled now, myeloma has been quiescent and being followed by him, says she likely developed heart failure from adriamycin  therapy which is now completed (although she received reduced dose adriamycin ) and now to complete remainder of lymphoma treatment with radiation therapy to the leg -- he recommended continue aggressive medical management of heart failure but would avoid catheterization  Filed Weights   09/06/23 1752 09/07/23 0415 09/08/23 0500  Weight: 57.1 kg 58.2 kg  58.2 kg    Intake/Output Summary (Last 24 hours) at 09/08/2023 0956 Last data filed at 09/08/2023 0600 Gross per 24 hour  Intake 881.57 ml  Output 1350 ml  Net -468.43 ml   AKI -- monitoring creatinine closely in setting of diuresis -- some improvement in creatinine with diuresis   Transaminitis -- suspect secondary to hepatic congestion from heart failure -- following LFTs  H/o PE -- remains on apixaban    Multiple Myeloma  Non-Hodgkins Lymphoma  -- she is followed by Dr. Rogers -- completed 6 cycles of R mini CHOP -- PET scan 6/25: Similar size and mild increase in hypermetabolic involving soft tissue mass in the right lower extremity, [place] 4. New gastric fundal hypermetabolism with persistent wall thickening  -- oncology consult to help guide prognosis in regards to heart failure and need for aggressive treatments  -- discussed with oncologist Dr MARLA who knows patient well, he feels her cancers are well controlled now, myeloma has been quiescent and being followed by him, says she likely developed heart failure from adriamycin  therapy which is now completed (although she received reduced dose adriamycin ) and now to complete remainder of lymphoma treatment with radiation therapy to the leg -- he recommended continue aggressive medical management of heart failure but would avoid catheterization  DVT prophylaxis: apixaban   Code Status: DNR  Communication: updated pt at bedside, verbalized understanding Disposition: goal to return home with [facility]   Consultants:  Cardiology Oncology Advanced HF team  Procedures:   Antimicrobials:    Subjective: Pt says she is feeling a lot better this morning. No specific complaints.   Objective: Vitals:  09/08/23 0500 09/08/23 0600 09/08/23 0741 09/08/23 0800  BP: (!) 95/53 (!) 105/55  106/64  Pulse: (!) 103 (!) 117  (!) 109  Resp: 14 (!) 21  17  Temp: 98.6 F (37 C)  (!) 97.5 F (36.4 C)   TempSrc: Oral  Oral   SpO2: 92% 93%   96%  Weight: 58.2 kg     Height:        Intake/Output Summary (Last 24 hours) at 09/08/2023 0956 Last data filed at 09/08/2023 0600 Gross per 24 hour  Intake 881.57 ml  Output 1350 ml  Net -468.43 ml   Filed Weights   09/06/23 1752 09/07/23 0415 09/08/23 0500  Weight: 57.1 kg 58.2 kg 58.2 kg   Examination:  General exam: Appears calm and comfortable NAD Respiratory system: bibasilar crackles. Respiratory effort normal. Cardiovascular system: normal S1 & S2 heard. No JVD, murmurs, rubs, gallops or clicks. 1+ pedal edema. Gastrointestinal system: Abdomen is nondistended, soft and nontender. No organomegaly or masses felt. Normal bowel sounds heard. Central nervous system: Alert and oriented. No focal neurological deficits. Extremities: 1+ edema bilateral. Skin: No rashes, lesions or ulcers. Psychiatry: Judgement and insight appear normal. Mood & affect appropriate.   Data Reviewed: I have personally reviewed following labs and imaging studies  CBC: Recent Labs  Lab 09/05/23 1345 09/06/23 0507 09/07/23 0420 09/08/23 0405  WBC 5.6 4.8 5.1 5.0  NEUTROABS 3.3  --   --   --   HGB 9.0* 8.2* 8.6* 8.0*  HCT 28.3* 26.7* 26.7* 25.6*  MCV 74.9* 74.6* 73.0* 73.8*  PLT 363 328 343 331    Basic Metabolic Panel: Recent Labs  Lab 09/05/23 1345 09/05/23 1410 09/06/23 0507 09/07/23 0420 09/08/23 0405  NA 140  --  143 144 143  K 4.4  --  3.7 3.3* 3.9  CL 108  --  113* 109 113*  CO2 16*  --  20* 21* 22  GLUCOSE 115*  --  91 102* 101*  BUN 53*  --  55* 46* 32*  CREATININE 1.91*  --  1.73* 1.58* 1.27*  CALCIUM  8.7*  --  8.3* 8.3* 8.3*  MG  --  2.1  --   --  1.7  PHOS  --   --  4.5  --   --     CBG: No results for input(s): GLUCAP in the last 168 hours.  Recent Results (from the past 240 hours)  Resp panel by RT-PCR (RSV, Flu A&B, Covid) Anterior Nasal Swab     Status: None   Collection Time: 09/05/23  4:33 PM   Specimen: Anterior Nasal Swab  Result Value Ref Range  Status   SARS Coronavirus 2 by RT PCR NEGATIVE NEGATIVE Final    Comment: (NOTE) SARS-CoV-2 target nucleic acids are NOT DETECTED.  The SARS-CoV-2 RNA is generally detectable in upper respiratory specimens during the acute phase of infection. The lowest concentration of SARS-CoV-2 viral copies this assay can detect is 138 copies/mL. A negative result does not preclude SARS-Cov-2 infection and should not be used as the sole basis for treatment or other patient management decisions. A negative result may occur with  improper specimen collection/handling, submission of specimen other than nasopharyngeal swab, presence of viral mutation(s) within the areas targeted by this assay, and inadequate number of viral copies(<138 copies/mL). A negative result must be combined with clinical observations, patient history, and epidemiological information. The expected result is Negative.  Fact Sheet for Patients:  BloggerCourse.com  Fact Sheet for Healthcare  Providers:  SeriousBroker.it  This test is no t yet approved or cleared by the [place]  FDA and  has been authorized for detection and/or diagnosis of SARS-CoV-2 by FDA under an Emergency Use Authorization (EUA). This EUA will remain  in effect (meaning this test can be used) for the duration of the COVID-19 declaration under Section 564(b)(1) of the Act, 21 U.S.C.section 360bbb-3(b)(1), unless the authorization is terminated  or revoked sooner.       Influenza A by PCR NEGATIVE NEGATIVE Final   Influenza B by PCR NEGATIVE NEGATIVE Final    Comment: (NOTE) The Xpert Xpress SARS-CoV-2/FLU/RSV plus assay is intended as an aid in the diagnosis of influenza from Nasopharyngeal swab specimens and should not be used as a sole basis for treatment. Nasal washings and aspirates are unacceptable for Xpert Xpress SARS-CoV-2/FLU/RSV testing.  Fact Sheet for  Patients: BloggerCourse.com  Fact Sheet for Healthcare Providers: SeriousBroker.it  This test is not yet approved or cleared by the [place]  FDA and has been authorized for detection and/or diagnosis of SARS-CoV-2 by FDA under an Emergency Use Authorization (EUA). This EUA will remain in effect (meaning this test can be used) for the duration of the COVID-19 declaration under Section 564(b)(1) of the Act, 21 U.S.C. section 360bbb-3(b)(1), unless the authorization is terminated or revoked.     Resp Syncytial Virus by PCR NEGATIVE NEGATIVE Final    Comment: (NOTE) Fact Sheet for Patients: BloggerCourse.com  Fact Sheet for Healthcare Providers: SeriousBroker.it  This test is not yet approved or cleared by the [place]  FDA and has been authorized for detection and/or diagnosis of SARS-CoV-2 by FDA under an Emergency Use Authorization (EUA). This EUA will remain in effect (meaning this test can be used) for the duration of the COVID-19 declaration under Section 564(b)(1) of the Act, 21 U.S.C. section 360bbb-3(b)(1), unless the authorization is terminated or revoked.  Performed at [facility], [address], [place], [place] [address]   MRSA Next Gen by PCR, Nasal     Status: None   Collection Time: 09/06/23  5:45 PM   Specimen: Nasal Mucosa; Nasal Swab  Result Value Ref Range Status   MRSA by PCR Next Gen NOT DETECTED NOT DETECTED Final    Comment: (NOTE) The GeneXpert MRSA Assay (FDA approved for NASAL specimens only), is one component of a comprehensive MRSA colonization surveillance program. It is not intended to diagnose MRSA infection nor to guide or monitor treatment for MRSA infections. Test performance is not FDA approved in patients less than 27 years old. Performed at [facility], [address] [place], [place] [address]      Radiology  Studies: ECHOCARDIOGRAM LIMITED Result Date: 09/06/2023    ECHOCARDIOGRAM LIMITED REPORT   Patient Name:   Leslie Williamson Date of Exam: 09/06/2023 Medical Rec #:  [id number]         Height:       65.0 in Accession #:    [id number]        Weight:       127.9 lb Date of Birth:  1931-10-12         BSA:          1.636 m Patient Age:    89 years          BP:           128/89 mmHg Patient Gender: F                 HR:  91 bpm. Exam Location:  [facility] Procedure: Limited Echo, Cardiac Doppler, Strain Analysis and Intracardiac            Opacification Agent (Both Spectral and Color Flow Doppler were            utilized during procedure). Indications:    Dyspnea R06.00  History:        Patient has prior history of Echocardiogram examinations, most                 recent 03/03/2023. CHF; Risk Factors:Hypertension and                 Dyslipidemia. Hx of COVID-19.  Sonographer:    Aida Pizza RCS Referring Phys: [id number] DORN FALCON BRANCH  Sonographer Comments: Global longitudinal strain was attempted. IMPRESSIONS  1. Left ventricular ejection fraction, by estimation, is 20%. The left ventricle has severely decreased function. The left ventricle demonstrates global hypokinesis. The average left ventricular global longitudinal strain is -4.2 %. The global longitudinal strain is abnormal.  2. Right ventricular systolic function is normal. The right ventricular size is normal.  3. The inferior vena cava is dilated in size with <50% respiratory variability, suggesting right atrial pressure of 15 mmHg. FINDINGS  Left Ventricle: Left ventricular ejection fraction, by estimation, is 20%. The left ventricle has severely decreased function. The left ventricle demonstrates global hypokinesis. Definity  contrast agent was given IV to delineate the left ventricular endocardial borders. The average left ventricular global longitudinal strain is -4.2 %. Strain was performed and the global longitudinal strain is abnormal. The left  ventricular internal cavity size was normal in size. There is no left ventricular hypertrophy. Right Ventricle: The right ventricular size is normal. Right vetricular wall thickness was not well visualized. Right ventricular systolic function is normal. Venous: The inferior vena cava is dilated in size with less than 50% respiratory variability, suggesting right atrial pressure of 15 mmHg. Additional Comments: 3D was performed not requiring image post processing on an independent workstation and was abnormal.  LEFT VENTRICLE PLAX 2D LVIDd:         5.00 cm   Diastology LVIDs:         4.60 cm   LV e' medial:    2.94 cm/s LV PW:         1.00 cm   LV E/e' medial:  28.6 LV IVS:        1.00 cm   LV e' lateral:   7.29 cm/s LVOT diam:     1.70 cm   LV E/e' lateral: 11.6 LVOT Area:     2.27 cm                          2D Longitudinal Strain                          2D Strain GLS Avg:     -4.2 %                           3D Volume EF:                          3D EF:        32 %  LV EDV:       150 ml                          LV ESV:       101 ml                          LV SV:        49 ml RIGHT VENTRICLE RV S prime:     10.30 cm/s TAPSE (M-mode): 2.1 cm LEFT ATRIUM             Index        RIGHT ATRIUM           Index LA diam:        3.50 cm 2.14 cm/m   RA Area:     19.10 cm LA Vol (A2C):   73.0 ml 44.63 ml/m  RA Volume:   53.30 ml  32.59 ml/m LA Vol (A4C):   43.5 ml 26.59 ml/m LA Biplane Vol: 60.9 ml 37.23 ml/m   AORTA Ao Root diam: 3.30 cm MITRAL VALVE MV Area (PHT): 5.27 cm    SHUNTS MV Decel Time: 144 msec    Systemic Diam: 1.70 cm MV E velocity: 84.20 cm/s MV A velocity: 80.50 cm/s MV E/A ratio:  1.05 Dorn Ross MD Electronically signed by Dorn Ross MD Signature Date/Time: 09/06/2023/5:17:58 PM    Final     Scheduled Meds:  acyclovir   400 mg Oral BID   allopurinol   300 mg Oral Daily   apixaban   5 mg Oral BID   Chlorhexidine  Gluconate Cloth  6 each Topical Daily    furosemide   80 mg Intravenous TID   sodium chloride  flush  3 mL Intravenous Q12H   Continuous Infusions:  milrinone  0.25 mcg/kg/min (09/08/23 0620)     LOS: 3 days   Critical Care Procedure Note Authorized and Performed by: KYM Louder MD  Total Critical Care time:  55 mins Due to a high probability of clinically significant, life threatening deterioration, the patient required my highest level of preparedness to intervene emergently and I personally spent this critical care time directly and personally managing the patient.  This critical care time included obtaining a history; examining the patient, pulse oximetry; ordering and review of studies; arranging urgent treatment with development of a management plan; evaluation of patient's response of treatment; frequent reassessment; and discussions with other providers.  This critical care time was performed to assess and manage the high probability of imminent and life threatening deterioration that could result in multi-organ failure.  It was exclusive of separately billable procedures and treating other patients and teaching time.    Afton Louder, MD How to contact the [facility] Attending or Consulting provider 7A - 7P or covering provider during after hours 7P -7A, for this patient?  Check the care team in [facility] and look for a) attending/consulting [facility] provider listed and b) the [facility] team listed Log into [email or web address] to find provider on call.  Locate the [facility] provider you are looking for under [facility] and page to a number that you can be directly reached. If you still have difficulty reaching the provider, please page the [facility] (Director on Call) for the Hospitalists listed on amion for assistance.  09/08/2023, 9:56 AM

## 2023-09-08 NOTE — Plan of Care (Signed)

## 2023-09-08 NOTE — Progress Notes (Signed)
 Patient with 20 beats of VT, on milrinone  drip, discussed with cardiology on-call at [facility], will repeat coox, lactic acid, will repeat labs including magnesium , if improving ccox then we will decrease milrinone  drip 0.375 mcg/kg/min to 0.25 mcg/kg/min. Brayton Lye MD

## 2023-09-08 NOTE — Progress Notes (Signed)
   09/08/23 0800  ReDS Vest / Clip  Station Marker A  Ruler Value 35  ReDS Value Range < 36  ReDS Actual Value 21

## 2023-09-08 NOTE — Progress Notes (Signed)
   09/08/23 1045  Spiritual Encounters  Type of Visit Initial  Care provided to: Pt and family  Referral source Chaplain team  Reason for visit Routine spiritual support  OnCall Visit No   Chaplain visited with the patient, Luther and her son Lynwood. They were quietly waiting in her room for her transport to [facility]. Kaydin is hopeful that she would be able to get the help she needed at [facility] and eventually return to her place. Lynwood said she has placed her care in God's hands and they are at peace with that. I wished them safe travels as I departed.   Carley Birmingham [facility]  [phone]

## 2023-09-08 NOTE — Progress Notes (Signed)
 [facility] at bedside

## 2023-09-08 NOTE — Progress Notes (Signed)
 Rounding Note   Patient Name: Leslie Williamson Date of Encounter: 09/08/2023  [facility] Cardiologist: New  Subjective No complaints  Scheduled Meds:  acyclovir   400 mg Oral BID   allopurinol   300 mg Oral Daily   apixaban   5 mg Oral BID   Chlorhexidine  Gluconate Cloth  6 each Topical Daily   potassium chloride   40 mEq Oral Once   sodium chloride  flush  3 mL Intravenous Q12H   Continuous Infusions:  magnesium  sulfate bolus IVPB     milrinone  0.25 mcg/kg/min (09/08/23 0620)   PRN Meds: acetaminophen  **OR** acetaminophen , bisacodyl , guaiFENesin -dextromethorphan , ondansetron  **OR** ondansetron  (ZOFRAN ) IV, polyethylene glycol, sodium chloride  flush, traZODone    Vital Signs  Vitals:   09/08/23 0400 09/08/23 0500 09/08/23 0600 09/08/23 0741  BP: 100/67 (!) 95/53 (!) 105/55   Pulse: (!) 110 (!) 103 (!) 117   Resp: 18 14 (!) 21   Temp:  98.6 F (37 C)  (!) 97.5 F (36.4 C)  TempSrc:  Oral  Oral  SpO2: 98% 92% 93%   Weight:  58.2 kg    Height:        Intake/Output Summary (Last 24 hours) at 09/08/2023 0805 Last data filed at 09/08/2023 0600 Gross per 24 hour  Intake 1121.57 ml  Output 1350 ml  Net -228.43 ml      09/08/2023    5:00 AM 09/07/2023    4:15 AM 09/06/2023    5:52 PM  Last 3 Weights  Weight (lbs) 128 lb 4.9 oz 128 lb 4.9 oz 125 lb 14.1 oz  Weight (kg) 58.2 kg 58.2 kg 57.1 kg      Telemetry Sinus rhythm and sinus tach. 20 beat run NSVT - Personally Reviewed  ECG  N/a - Personally Reviewed  Physical Exam  GEN: No acute distress.   Neck: + JVD Cardiac: RRR, no murmurs, rubs, or gallops.  Respiratory: crackles bilaterlaly GI: Soft, nontender, non-distended  MS: No edema; No deformity. Neuro:  Nonfocal  Psych: Normal affect   Labs High Sensitivity Troponin:   Recent Labs  Lab 09/05/23 1345 09/05/23 1501  TROPONINIHS 261* 272*     Chemistry Recent Labs  Lab 09/05/23 1410 09/06/23 0507 09/06/23 0855 09/07/23 0420  09/08/23 0405  NA  --  143  --  144 143  K  --  3.7  --  3.3* 3.9  CL  --  113*  --  109 113*  CO2  --  20*  --  21* 22  GLUCOSE  --  91  --  102* 101*  BUN  --  55*  --  46* 32*  CREATININE  --  1.73*  --  1.58* 1.27*  CALCIUM   --  8.3*  --  8.3* 8.3*  MG 2.1  --   --   --  1.7  PROT  --   --  4.9* 4.6* 4.4*  ALBUMIN  --  3.0* 3.1* 3.0* 2.7*  AST  --   --  70* 52* 31  ALT  --   --  118* 111* 85*  ALKPHOS  --   --  155* 145* 120  BILITOT  --   --  1.6* 1.1 0.9  GFRNONAA  --  28*  --  31* 40*  ANIONGAP  --  10  --  14 8    Lipids No results for input(s): CHOL, TRIG, HDL, LABVLDL, LDLCALC, CHOLHDL in the last 168 hours.  Hematology Recent Labs  Lab 09/06/23 0507 09/07/23 9579  09/08/23 0405  WBC 4.8 5.1 5.0  RBC 3.58* 3.66* 3.47*  HGB 8.2* 8.6* 8.0*  HCT 26.7* 26.7* 25.6*  MCV 74.6* 73.0* 73.8*  MCH 22.9* 23.5* 23.1*  MCHC 30.7 32.2 31.3  RDW 19.2* 19.6* 19.3*  PLT 328 343 331   Thyroid No results for input(s): TSH, FREET4 in the last 168 hours.  BNP Recent Labs  Lab 09/05/23 1410 09/07/23 0420  BNP >4,500.0* >4,500.0*    DDimer No results for input(s): DDIMER in the last 168 hours.   Radiology  ECHOCARDIOGRAM LIMITED Result Date: 09/06/2023    ECHOCARDIOGRAM LIMITED REPORT   Patient Name:   MACKINZE CRIADO Date of Exam: 09/06/2023 Medical Rec #:  [id number]         Height:       65.0 in Accession #:    [id number]        Weight:       127.9 lb Date of Birth:  1931/10/13         BSA:          1.636 m Patient Age:    89 years          BP:           128/89 mmHg Patient Gender: F                 HR:           91 bpm. Exam Location:  [facility] Procedure: Limited Echo, Cardiac Doppler, Strain Analysis and Intracardiac            Opacification Agent (Both Spectral and Color Flow Doppler were            utilized during procedure). Indications:    Dyspnea R06.00  History:        Patient has prior history of Echocardiogram examinations, most                  recent 03/03/2023. CHF; Risk Factors:Hypertension and                 Dyslipidemia. Hx of COVID-19.  Sonographer:    Aida Pizza RCS Referring Phys: [id number] DORN FALCON Annarose Ouellet  Sonographer Comments: Global longitudinal strain was attempted. IMPRESSIONS  1. Left ventricular ejection fraction, by estimation, is 20%. The left ventricle has severely decreased function. The left ventricle demonstrates global hypokinesis. The average left ventricular global longitudinal strain is -4.2 %. The global longitudinal strain is abnormal.  2. Right ventricular systolic function is normal. The right ventricular size is normal.  3. The inferior vena cava is dilated in size with <50% respiratory variability, suggesting right atrial pressure of 15 mmHg. FINDINGS  Left Ventricle: Left ventricular ejection fraction, by estimation, is 20%. The left ventricle has severely decreased function. The left ventricle demonstrates global hypokinesis. Definity  contrast agent was given IV to delineate the left ventricular endocardial borders. The average left ventricular global longitudinal strain is -4.2 %. Strain was performed and the global longitudinal strain is abnormal. The left ventricular internal cavity size was normal in size. There is no left ventricular hypertrophy. Right Ventricle: The right ventricular size is normal. Right vetricular wall thickness was not well visualized. Right ventricular systolic function is normal. Venous: The inferior vena cava is dilated in size with less than 50% respiratory variability, suggesting right atrial pressure of 15 mmHg. Additional Comments: 3D was performed not requiring image post processing on an independent workstation and was abnormal.  LEFT VENTRICLE PLAX 2D LVIDd:  5.00 cm   Diastology LVIDs:         4.60 cm   LV e' medial:    2.94 cm/s LV PW:         1.00 cm   LV E/e' medial:  28.6 LV IVS:        1.00 cm   LV e' lateral:   7.29 cm/s LVOT diam:     1.70 cm   LV E/e' lateral: 11.6 LVOT  Area:     2.27 cm                          2D Longitudinal Strain                          2D Strain GLS Avg:     -4.2 %                           3D Volume EF:                          3D EF:        32 %                          LV EDV:       150 ml                          LV ESV:       101 ml                          LV SV:        49 ml RIGHT VENTRICLE RV S prime:     10.30 cm/s TAPSE (M-mode): 2.1 cm LEFT ATRIUM             Index        RIGHT ATRIUM           Index LA diam:        3.50 cm 2.14 cm/m   RA Area:     19.10 cm LA Vol (A2C):   73.0 ml 44.63 ml/m  RA Volume:   53.30 ml  32.59 ml/m LA Vol (A4C):   43.5 ml 26.59 ml/m LA Biplane Vol: 60.9 ml 37.23 ml/m   AORTA Ao Root diam: 3.30 cm MITRAL VALVE MV Area (PHT): 5.27 cm    SHUNTS MV Decel Time: 144 msec    Systemic Diam: 1.70 cm MV E velocity: 84.20 cm/s MV A velocity: 80.50 cm/s MV E/A ratio:  1.05 Dorn Ross MD Electronically signed by Dorn Ross MD Signature Date/Time: 09/06/2023/5:17:58 PM    Final     Cardiac Studies   Patient Profile   MARYN FREELOVE is a 89 y.o. female with a hx of multiple myeloma, lymphoma, HTN, HLD, chronic HFpEF, history of PE on eliquis  who is being seen 09/06/2023 for the evaluation of chest pain and SOB at the request of Dr Willette.   Assessment & Plan   1.Acute HFrEF/Cardiogenic shock - Jan 2025 echo: LVE 50-55%, no WMAs, grade I dd, normal RV function - 08/2023 echo LVEF 20%, normal RV - CT bilateral pleural effusions, BNP >4500. Elevated Cr, LFTs, lactic acid on admission   -patient with port, coox checked  and 28%.  - discussed with patient and family. 89 yo but independent with ADLs, mobile, no significant cognitive deficits at baseline. Initiated milrinone  at least for palliation of symptoms and time to discuss further goals of care with family, disucssed not a candidate for long term therapies.  - per oncology her cancer is overall stable. Patient seen by palliative, goals to continue  medical treatment  - milrinone  started at 0.25, repeat coox 45%. Milrinone  increased to 0.375 and coox up to 61% - overnight run of NSVT 20 beats, milrinone  was lowered back to 0.25 - lactate has normalized, Cr and LFTs trending down  - on IV lasix  80mg  bid. Negative yesterday. Cr trending down.  -redose IV lasix  80mg  tid today and reassess in AM - with soft bp's hold on there HF meds   - poor candidate to consider cath given advanced age, lymphoma, chronic anemia - discussions with oncology, cardiomyopathy likely secondary to adriamycin  for her lymphoma.  - will discuss with medicine team transfer to [facility] to med team with HF team consultation.    2. Elevated troponin - elevated trop in setting of significant HF, suspect most likely demand ischemnia - isolated episode of atypical chest pain worst with deep breathing - at this time do not suspect ACS - poor candidate to consider cath given advanced age, lymphoma, AKI, chronic anemia - manage HF and follow symptoms.        3.AKI - Cr up to 1.9 on admission, basleine around 0.9 to 1.1 - Cr trending down on milrinone  and with diuresis   4 Elevated LFTs - secondary to venous congestion and cardiogenic shock - improving on milrinone  and with diuresis  5. NSVT - 20 beat run last night of NSVT - K 3.9, Mg 1.7. These have been repleated - milrinone  was lowered overnight from 0.375 to 0.25.     5.History of PE - on eliquis      6. Mulitple myeloma - followed by oncology, Revlimid  and Darzalex  on hold while treating lymphoma.    7. Lymphoma - completed 6 cycles of R mini CHOP  - PET scan from 08/11/2023: Similar size and mild increase in hypermetabolic involving soft tissue mass in the right lower extremity, [place] 4. New gastric fundal hypermetabolism with persistent wall thickening.  - per oncology stable, recs to continue aggressive mendical management of her cardiogenic shock.    For questions or updates,  please contact [facility] Please consult [email or web address] for contact info under     Signed, Alvan Carrier, MD  09/08/2023, 8:05 AM

## 2023-09-09 ENCOUNTER — Inpatient Hospital Stay (HOSPITAL_COMMUNITY)

## 2023-09-09 DIAGNOSIS — Z515 Encounter for palliative care: Secondary | ICD-10-CM | POA: Diagnosis not present

## 2023-09-09 DIAGNOSIS — C8335 Diffuse large B-cell lymphoma, lymph nodes of inguinal region and lower limb: Secondary | ICD-10-CM | POA: Diagnosis not present

## 2023-09-09 DIAGNOSIS — R2241 Localized swelling, mass and lump, right lower limb: Secondary | ICD-10-CM | POA: Diagnosis not present

## 2023-09-09 DIAGNOSIS — Z7189 Other specified counseling: Secondary | ICD-10-CM | POA: Diagnosis not present

## 2023-09-09 DIAGNOSIS — I427 Cardiomyopathy due to drug and external agent: Secondary | ICD-10-CM | POA: Diagnosis not present

## 2023-09-09 DIAGNOSIS — I340 Nonrheumatic mitral (valve) insufficiency: Secondary | ICD-10-CM | POA: Diagnosis not present

## 2023-09-09 DIAGNOSIS — N1831 Chronic kidney disease, stage 3a: Secondary | ICD-10-CM

## 2023-09-09 DIAGNOSIS — I509 Heart failure, unspecified: Secondary | ICD-10-CM

## 2023-09-09 DIAGNOSIS — Z66 Do not resuscitate: Secondary | ICD-10-CM

## 2023-09-09 DIAGNOSIS — I5023 Acute on chronic systolic (congestive) heart failure: Secondary | ICD-10-CM | POA: Diagnosis not present

## 2023-09-09 LAB — COMPREHENSIVE METABOLIC PANEL WITH GFR
ALT: 74 U/L — ABNORMAL HIGH (ref 0–44)
ALT: 73 U/L — ABNORMAL HIGH (ref 0–44)
AST: 26 U/L (ref 15–41)
AST: 27 U/L (ref 15–41)
Albumin: 3.1 g/dL — ABNORMAL LOW (ref 3.5–5.0)
Albumin: 3.1 g/dL — ABNORMAL LOW (ref 3.5–5.0)
Alkaline Phosphatase: 110 U/L (ref 38–126)
Alkaline Phosphatase: 112 U/L (ref 38–126)
Anion gap: 10 (ref 5–15)
Anion gap: 12 (ref 5–15)
BUN: 24 mg/dL — ABNORMAL HIGH (ref 8–23)
BUN: 24 mg/dL — ABNORMAL HIGH (ref 8–23)
CO2: 24 mmol/L (ref 22–32)
CO2: 22 mmol/L (ref 22–32)
Calcium: 8.9 mg/dL (ref 8.9–10.3)
Calcium: 8.9 mg/dL (ref 8.9–10.3)
Chloride: 107 mmol/L (ref 98–111)
Chloride: 104 mmol/L (ref 98–111)
Creatinine, Ser: 1.15 mg/dL — ABNORMAL HIGH (ref 0.44–1.00)
Creatinine, Ser: 1.28 mg/dL — ABNORMAL HIGH (ref 0.44–1.00)
GFR, Estimated: 45 mL/min — ABNORMAL LOW (ref >60)
GFR, Estimated: 40 mL/min — ABNORMAL LOW (ref >60)
Glucose, Bld: 127 mg/dL — ABNORMAL HIGH (ref 70–99)
Glucose, Bld: 125 mg/dL — ABNORMAL HIGH (ref 70–99)
Potassium: 3.8 mmol/L (ref 3.5–5.1)
Potassium: 3.8 mmol/L (ref 3.5–5.1)
Sodium: 141 mmol/L (ref 135–145)
Sodium: 138 mmol/L (ref 135–145)
Total Bilirubin: 1.1 mg/dL (ref 0.0–1.2)
Total Bilirubin: 0.9 mg/dL (ref 0.0–1.2)
Total Protein: 5 g/dL — ABNORMAL LOW (ref 6.5–8.1)
Total Protein: 5.1 g/dL — ABNORMAL LOW (ref 6.5–8.1)

## 2023-09-09 LAB — CBC
HCT: 29.2 % — ABNORMAL LOW (ref 36.0–46.0)
Hemoglobin: 9.3 g/dL — ABNORMAL LOW (ref 12.0–15.0)
MCH: 23.1 pg — ABNORMAL LOW (ref 26.0–34.0)
MCHC: 31.8 g/dL (ref 30.0–36.0)
MCV: 72.6 fL — ABNORMAL LOW (ref 80.0–100.0)
Platelets: 373 K/uL (ref 150–400)
RBC: 4.02 MIL/uL (ref 3.87–5.11)
RDW: 19.4 % — ABNORMAL HIGH (ref 11.5–15.5)
WBC: 6.8 K/uL (ref 4.0–10.5)
nRBC: 5.5 % — ABNORMAL HIGH (ref 0.0–0.2)

## 2023-09-09 LAB — COOXEMETRY PANEL
Carboxyhemoglobin: 1.2 % (ref 0.5–1.5)
Methemoglobin: <0.7 % (ref 0.0–1.5)
O2 Saturation: 48.4 %
Total hemoglobin: 9.6 g/dL — ABNORMAL LOW (ref 12.0–16.0)

## 2023-09-09 LAB — MAGNESIUM: Magnesium: 1.7 mg/dL (ref 1.7–2.4)

## 2023-09-09 MED ORDER — SPIRONOLACTONE 12.5 MG HALF TABLET
12.5000 mg | ORAL_TABLET | Freq: Every day | ORAL | Status: DC
  Administered 2023-09-10 – 2023-09-12 (×3): 12.5 mg via ORAL
  Filled 2023-09-09 (×4): qty 1

## 2023-09-09 MED ORDER — LORAZEPAM 2 MG/ML IJ SOLN
INTRAMUSCULAR | Status: AC
  Filled 2023-09-09: qty 1

## 2023-09-09 MED ORDER — POTASSIUM CHLORIDE CRYS ER 20 MEQ PO TBCR: 20.0000 meq | EXTENDED_RELEASE_TABLET | Freq: Once | ORAL | Status: DC

## 2023-09-09 MED ORDER — DIGOXIN 0.0625 MG HALF TABLET
0.0625 mg | ORAL_TABLET | Freq: Every day | ORAL | Status: DC
  Administered 2023-09-10 – 2023-09-14 (×5): 0.0625 mg via ORAL
  Filled 2023-09-09 (×6): qty 1

## 2023-09-09 MED ORDER — GADOBUTROL 1 MMOL/ML IV SOLN
10.0000 mL | Freq: Once | INTRAVENOUS | Status: AC | PRN
  Administered 2023-09-09: 10 mL via INTRAVENOUS

## 2023-09-09 MED ORDER — FUROSEMIDE 10 MG/ML IJ SOLN
80.0000 mg | Freq: Two times a day (BID) | INTRAMUSCULAR | Status: AC
  Administered 2023-09-09: 80 mg via INTRAVENOUS
  Filled 2023-09-09 (×2): qty 8

## 2023-09-09 MED ORDER — POTASSIUM CHLORIDE CRYS ER 20 MEQ PO TBCR
40.0000 meq | EXTENDED_RELEASE_TABLET | Freq: Once | ORAL | Status: DC
  Filled 2023-09-09: qty 2

## 2023-09-09 MED ORDER — LORAZEPAM 2 MG/ML IJ SOLN
1.0000 mg | Freq: Once | INTRAMUSCULAR | Status: AC
  Administered 2023-09-09: 1 mg via INTRAVENOUS

## 2023-09-09 NOTE — Plan of Care (Signed)

## 2023-09-09 NOTE — TOC Initial Note (Signed)
 Transition of Care [facility]) - Initial/Assessment Note    Patient Details  Name: Leslie Williamson MRN: [id number] Date of Birth: 09-12-1931  Transition of [facility]) CM/SW Contact:    Justina Delcia Czar, RN Phone Number: [phone] 09/09/2023, 2:18 PM  Clinical Narrative:                  Spoke to pt and sons at bedside, Lynwood and [name].  Pt lives in home with [name]. Pt was independent using RW as needed. Son requesting BSC, contacted [facility] rep, Ryan. Family agreeable to [facility], offered choice for HH, medicare.gov listing with rating provided to sons. Contacted [facility] char, Cory with new referral.  Has scale at home. Education provided on daily weights and low sodium/heart healthy diet. Provided Living Better with HF.   Will arrange hospital follow up appt on day of dc.      Expected Discharge Plan: Home w Home Health Services Barriers to Discharge: Continued Medical Work up   Patient Goals and CMS Choice Patient states their goals for this hospitalization and ongoing recovery are:: wants to remain independent CMS Medicare.gov Compare Post Acute Care list provided to:: Patient Represenative (must comment) Choice offered to / list presented to : Adult Children [place] ownership interest in [facility].provided to::  (n/a)    Expected Discharge Plan and Services In-house Referral: Clinical Social Work Discharge Planning Services: CM Consult Post Acute Care Choice: Home Health Living arrangements for the past 2 months: Single Family Home                 DME Arranged: 3-N-1 DME Agency: [facility] Date DME Agency Contacted: 09/09/23 Time DME Agency Contacted: [phone] Representative spoke with at DME Agency: Ryan HH Arranged: RN, PT [facility] Agency: [facility] Date [facility] Agency Contacted: 09/09/23 Time HH Agency Contacted: [phone] Representative spoke with at [facility] Agency: Darleene Gowda  Prior Living Arrangements/Services Living arrangements for the past 2 months:  Single Family Home Lives with:: Adult Children Patient language and need for interpreter reviewed:: Yes Do you feel safe going back to the place where you live?: Yes      Need for Family Participation in Patient Care: Yes (Comment) Care giver support system in place?: Yes (comment) Current home services: DME (rolling walker) Criminal Activity/Legal Involvement Pertinent to Current Situation/Hospitalization: No - Comment as needed  Activities of Daily Living   ADL Screening (condition at time of admission) Independently performs ADLs?: Yes (appropriate for developmental age) Is the patient deaf or have difficulty hearing?: No Does the patient have difficulty seeing, even when wearing glasses/contacts?: No Does the patient have difficulty concentrating, remembering, or making decisions?: No  Permission Sought/Granted Permission sought to share information with : Case Manager, PCP, Family Supports Permission granted to share information with : Yes, Verbal Permission Granted  Share Information with NAME: Kariyah Baugh  Permission granted to share info w AGENCY: Home Health, DME, PCP  Permission granted to share info w Relationship: son  Permission granted to share info w Contact Information: [phone]  Emotional Assessment Appearance:: Appears stated age Attitude/Demeanor/Rapport: Engaged Affect (typically observed): Accepting Orientation: : Oriented to Self, Oriented to Place, Oriented to  Time, Oriented to Situation Alcohol  / Substance Use: Not Applicable Psych Involvement: No (comment)  Admission diagnosis:  Acute exacerbation of CHF (congestive heart failure) (HCC) [I50.9] AKI (acute kidney injury) (HCC) [N17.9] Chest pain, unspecified type [R07.9] Acute on chronic congestive heart failure, unspecified heart failure type (HCC) [I50.9] Patient  Active Problem List   Diagnosis Date Noted   Anthracycline-induced dilated cardiomyopathy (HCC) 09/07/2023   Acute exacerbation of  CHF (congestive heart failure) (HCC) 09/05/2023   Abnormal gastrointestinal positron emission tomography (PET) scan 08/25/2023   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) 03/01/2023   Mass of right lower leg 02/08/2023   Malignant neoplasm of lymphoid, hematopoietic and related tissue, unspecified (HCC) 02/08/2023   Acute pulmonary embolism (HCC) 07/03/2022   DNR (do not resuscitate) 07/02/2022   HFrEF 06/03/2022   CHF (congestive heart failure) (HCC) 05/31/2022   Drug-induced neutropenia (HCC) 01/20/2022   Multiple myeloma without remission (HCC) 11/16/2021   Acute renal failure superimposed on stage 3a chronic kidney disease (HCC) 08/06/2021   Essential thrombocytosis (HCC) 08/06/2021   MGUS (monoclonal gammopathy of unknown significance) 08/06/2021   COVID-19 virus infection 08/05/2021   Acute respiratory failure with hypoxia (HCC) 08/05/2021   Occult blood positive stool 10/23/2020   Labyrinthine vertigo 10/26/2015   Elevated troponin 10/25/2015   Chest pain 10/25/2015   Vertigo    S/P splenectomy 01/01/2014   Claudication of both lower extremities (HCC) 11/06/2013   NHL (non-Hodgkin's lymphoma) (HCC) 12/17/2010   WEIGHT LOSS, ABNORMAL 02/05/2009   History of colonic polyps 02/05/2009   GERD 08/08/2008   Iron  deficiency anemia 06/12/2008   Anxiety state 03/20/2007   Hyperlipidemia 01/11/2006   JAK2+ MDP (myeloproliferative disorder) presenting with thrombocytosis (HCC) 01/11/2006   CARPAL TUNNEL SYNDROME 01/11/2006   Essential hypertension 01/11/2006   PVD- know Lt SFA disease, bilat PT and AT disease 01/11/2006   Allergic rhinitis 01/11/2006   SEBORRHEIC KERATOSIS 01/11/2006   PCP:  Carlette Benita Area, MD Pharmacy:   [facility] - [place], [place] - [address] [place] [address] Phone: [phone] Fax: [phone]  [facility] [place], [place] - [address] [address] [place] [address] Phone: [phone] Fax: [phone]  [facility] [address] [place] [address] Phone: [phone] Fax: [phone]  [facility] - [address], [place] - [address] [place] [address] Phone: [phone] Fax: [phone]     Social Drivers of Health (SDOH) Social History: SDOH Screenings   Food Insecurity: No Food Insecurity (09/05/2023)  Housing: Low Risk  (09/05/2023)  Transportation Needs: No Transportation Needs (09/05/2023)  Utilities: Not At Risk (09/05/2023)  Alcohol  Screen: Low Risk  (12/31/2019)  Depression (PHQ2-9): Low Risk  (08/19/2023)  Financial Resource Strain: Low Risk  (12/31/2019)  Physical Activity: Inactive (12/31/2019)  Social Connections: Moderately Isolated (09/05/2023)  Stress: No Stress Concern Present (12/31/2019)  Tobacco Use: Medium Risk (09/06/2023)   SDOH Interventions: Food Insecurity Interventions: Intervention Not Indicated Housing Interventions: Intervention Not Indicated Transportation Interventions: Intervention Not Indicated Utilities Interventions: Intervention Not Indicated Social Connections Interventions: Intervention Not Indicated   Readmission Risk Interventions    09/07/2023    8:35 AM 07/05/2022    1:10 PM 07/05/2022    1:09 PM  Readmission Risk Prevention Plan  Transportation Screening Complete  Complete  PCP or Specialist Appt within 3-5 Days  Complete   Home Care Screening Complete    Medication Review (RN CM) Complete    HRI or Home Care Consult  Complete   Social Work Consult for Recovery Care Planning/Counseling  Complete   Palliative Care Screening  Not Applicable   Medication Review Oceanographer)  Complete

## 2023-09-09 NOTE — Progress Notes (Signed)
 Heart Failure Navigator Progress Note  Assessed for Heart & Vascular TOC clinic readiness.  Patient does not meet criteria due to Advanced Heart Failure team consulted with Dr. Bensimhon. .   Navigator will sign off at this time.   Stephane Haddock, BSN, Scientist, clinical (histocompatibility and immunogenetics) Only

## 2023-09-09 NOTE — Progress Notes (Signed)
 Daily Progress Note   Patient Name: Leslie Williamson       Date: 09/09/2023 DOB: 1932/02/17  Age: 89 y.o. MRN#: [id number] Attending Physician: Fairy Frames, MD Primary Care Physician: [name], [name] Demissie, MD Admit Date: 09/05/2023  Reason for Consultation/Follow-up: Establishing goals of care  Subjective: Feels well, slept well last night, not short of breath. Eating breakfast well.   Length of Stay: 4  Current Medications: Scheduled Meds:   acyclovir   400 mg Oral BID   allopurinol   300 mg Oral Daily   apixaban   5 mg Oral BID   Chlorhexidine  Gluconate Cloth  6 each Topical Daily   sodium chloride  flush  3 mL Intravenous Q12H    Continuous Infusions:  milrinone  0.25 mcg/kg/min (09/09/23 0429)    PRN Meds: acetaminophen  **OR** acetaminophen , bisacodyl , guaiFENesin -dextromethorphan , ondansetron  **OR** ondansetron  (ZOFRAN ) IV, polyethylene glycol, sodium chloride  flush, traZODone   Physical Exam Constitutional:      General: She is not in acute distress.    Appearance: She is ill-appearing.  Pulmonary:     Effort: Pulmonary effort is normal.  Skin:    General: Skin is warm and dry.  Neurological:     Mental Status: She is alert and oriented to person, place, and time.             Vital Signs: BP 104/74 (BP Location: Left Arm)   Pulse (!) 117   Temp 98.1 F (36.7 C) (Oral)   Resp 20   Ht 5' (1.524 m)   Wt 54 kg   SpO2 96%   BMI 23.24 kg/m  SpO2: SpO2: 96 % O2 Device: O2 Device: Room Air O2 Flow Rate:    Intake/output summary:  Intake/Output Summary (Last 24 hours) at 09/09/2023 1025 Last data filed at 09/09/2023 0429 Gross per 24 hour  Intake 287.17 ml  Output 1050 ml  Net -762.83 ml   LBM: Last BM Date : 09/08/23 Baseline Weight: Weight: 57.2 kg Most recent  weight: Weight: 54 kg       Palliative Assessment/Data: PPS 50%      Patient Active Problem List   Diagnosis Date Noted   Anthracycline-induced dilated cardiomyopathy (HCC) 09/07/2023   Acute exacerbation of CHF (congestive heart failure) (HCC) 09/05/2023   Abnormal gastrointestinal positron emission tomography (PET) scan 08/25/2023   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) 03/01/2023   Mass of right lower leg 02/08/2023   Malignant neoplasm of lymphoid, hematopoietic and related tissue, unspecified (HCC) 02/08/2023   Acute pulmonary embolism (HCC) 07/03/2022   DNR (do not resuscitate) 07/02/2022   HFrEF 06/03/2022   CHF (congestive heart failure) (HCC) 05/31/2022   Drug-induced neutropenia (HCC) 01/20/2022   Multiple myeloma without remission (HCC) 11/16/2021   Acute renal failure superimposed on stage 3a chronic kidney disease (HCC) 08/06/2021   Essential thrombocytosis (HCC) 08/06/2021   MGUS (monoclonal gammopathy of unknown significance) 08/06/2021   COVID-19 virus infection 08/05/2021   Acute respiratory failure with hypoxia (HCC) 08/05/2021   Occult blood positive stool 10/23/2020   Labyrinthine vertigo 10/26/2015   Elevated troponin 10/25/2015   Chest pain 10/25/2015   Vertigo    S/P splenectomy 01/01/2014   Claudication of both lower  extremities (HCC) 11/06/2013   NHL (non-Hodgkin's lymphoma) (HCC) 12/17/2010   WEIGHT LOSS, ABNORMAL 02/05/2009   History of colonic polyps 02/05/2009   GERD 08/08/2008   Iron  deficiency anemia 06/12/2008   Anxiety state 03/20/2007   Hyperlipidemia 01/11/2006   JAK2+ MDP (myeloproliferative disorder) presenting with thrombocytosis (HCC) 01/11/2006   CARPAL TUNNEL SYNDROME 01/11/2006   Essential hypertension 01/11/2006   PVD- know Lt SFA disease, bilat PT and AT disease 01/11/2006   Allergic rhinitis 01/11/2006   SEBORRHEIC KERATOSIS 01/11/2006    Palliative Care Assessment & Plan    HPI: 89 y.o. 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   Assessment: Follow up today with Leslie Williamson. Her son Leslie Williamson is at her bedside.  Patient was seen by palliative team x2 while at [facility]. During those meetings patient's goal was to continue care with hopes of improving enough to return home.   Today Leslie Williamson tells me she continues to feel well. Wants to continue with current medical management.   We review her oncologist Dr. Katherleen input on care - he recommended continue aggressive medical management of heart failure.  She denies any symptom management needs.  Son Leslie Williamson shares some concerns about nursing care at [facility] - assisted him with who to reach out to regarding concerns.    Recommendations/Plan: Continue current medical care - time for outcomes PMT will follow intermittently - please reach out with concerns, family has our number and will call with needs  Code Status: DNR  Care plan was discussed with patient and son Leslie Williamson  Thank you for allowing the Palliative Medicine Team to assist in the care of this patient.   Total Time 35 minutes Prolonged Time Billed  no   Time spent includes: Detailed review of medical records (labs, imaging, vital signs), medically appropriate exam, discussion with treatment team, counseling and educating patient, family and/or staff, documenting clinical information, medication management and coordination of care.     *Please note that this is a verbal dictation therefore any spelling or grammatical errors are due to the Dragon Medical One system interpretation.  Tobey Jama Barnacle, DNP, [facility] Palliative Medicine Team Team Phone # [phone]  Pager [phone]

## 2023-09-09 NOTE — Progress Notes (Signed)
 PROGRESS NOTE    Leslie Williamson  FMW:[id number] DOB: [birth year] DOA: 09/05/2023 PCP: Carlette Benita Area, MD  89/F w chronic diastolic dysfunction CHF, pulmonary embolism, HTN, small B-cell lymphoma of the spleen/non-Hodgkin's lymphoma, PAD, essential thrombocytosis, JAK2 plus myelo proliferative disorder, MGUS and GERD Presented to [facility] ED with complaints of chest discomfort increasing shortness of breath and nausea for the last couple days. - Workup in the ED noted bilateral pleural effusions, BNP more than 4500, lactic acidosis, elevated creatinine - Echo noted EF of 20%, normal RV - Seen by palliative care, cardiology, started on IV milrinone  and transferred to [facility], arrived after midnight 7/18  Subjective: -Sleeping, denies complaints, mild confusion this morning  Assessment and Plan:  Acute systolic CHF Low output failure -2D echo noted EF down to 20%, normal RV, suspected to be secondary to Adriamycin  -Complicated by lactic acidosis, AKI, after discussion with oncology and palliative care she was started on IV milrinone  and transferred to [facility] - Volume status has improved, conservative management - Await heart failure team input, anticipate milrinone  wean, not a candidate for advanced therapies - Increase activity, PT OT eval -?  Add low-dose ARB   AKI -- cardiorenal, improving on milrinone    Transaminitis -- Second to passive hepatic congestion from CHF, improving   H/o PE -- remains on apixaban     Multiple Myeloma  Non-Hodgkins Lymphoma  -- she is followed by Dr. Rogers -- completed 6 cycles of R mini CHOP -- Attending at [facility] Dr.Johnson discussed with oncologist Dr MARLA who knows patient well, he felt  her cancers are well controlled now, myeloma has been quiescent and being followed by him, says she likely developed heart failure from adriamycin  therapy which is now completed and now to complete remainder of lymphoma treatment with radiation therapy to the  leg -- he recommended continue aggressive medical management of heart failure   Chronic anemia -Likely multifactorial, overall improving, monitor   DVT prophylaxis: apixaban   Code Status: DNR  Communication: son at bedside,  Disposition Plan:   Consultants:    Procedures:   Antimicrobials:    Objective: Vitals:   09/08/23 2000 09/09/23 0000 09/09/23 0400 09/09/23 0759  BP: (!) 106/91 102/66 93/64 104/74  Pulse: (!) 106 (!) 115 (!) 110 (!) 117  Resp: (!) 22 19 19 20   Temp:  98.3 F (36.8 C) 98.1 F (36.7 C)   TempSrc:  Oral Oral   SpO2: 100% 95% 92% 96%  Weight:  54 kg    Height:  5' (1.524 m)      Intake/Output Summary (Last 24 hours) at 09/09/2023 0954 Last data filed at 09/09/2023 0429 Gross per 24 hour  Intake 287.17 ml  Output 1050 ml  Net -762.83 ml   Filed Weights   09/07/23 0415 09/08/23 0500 09/09/23 0000  Weight: 58.2 kg 58.2 kg 54 kg    Examination:  General exam: Chronically ill elderly female laying in bed, AAO x 2, mild cognitive deficits HEENT: Positive JVD, Port-A-Cath noted CVS: S1-S2, regular rhythm Lungs: Decreased breath sounds to bases Abdomen: Soft, nontender, bowel sounds present Remedies: Trace edema Skin: No rashes Psychiatry:  Mood & affect appropriate.     Data Reviewed:   CBC: Recent Labs  Lab 09/05/23 1345 09/06/23 0507 09/07/23 0420 09/08/23 0405 09/09/23 0430  WBC 5.6 4.8 5.1 5.0 6.8  NEUTROABS 3.3  --   --   --   --   HGB 9.0* 8.2* 8.6* 8.0* 9.3*  HCT 28.3* 26.7* 26.7*  25.6* 29.2*  MCV 74.9* 74.6* 73.0* 73.8* 72.6*  PLT 363 328 343 331 373   Basic Metabolic Panel: Recent Labs  Lab 09/05/23 1410 09/06/23 0507 09/07/23 0420 09/08/23 0405 09/09/23 0430 09/09/23 0730  NA  --  143 144 143 141 138  K  --  3.7 3.3* 3.9 3.8 3.8  CL  --  113* 109 113* 107 104  CO2  --  20* 21* 22 24 22   GLUCOSE  --  91 102* 101* 127* 125*  BUN  --  55* 46* 32* 24* 24*  CREATININE  --  1.73* 1.58* 1.27* 1.15* 1.28*  CALCIUM    --  8.3* 8.3* 8.3* 8.9 8.9  MG 2.1  --   --  1.7 1.7  --   PHOS  --  4.5  --   --   --   --    GFR: Estimated Creatinine Clearance: 21 mL/min (A) (by C-G formula based on SCr of 1.28 mg/dL (H)). Liver Function Tests: Recent Labs  Lab 09/06/23 0855 09/07/23 0420 09/08/23 0405 09/09/23 0430 09/09/23 0730  AST 70* 52* 31 26 27   ALT 118* 111* 85* 74* 73*  ALKPHOS 155* 145* 120 110 112  BILITOT 1.6* 1.1 0.9 1.1 0.9  PROT 4.9* 4.6* 4.4* 5.0* 5.1*  ALBUMIN 3.1* 3.0* 2.7* 3.1* 3.1*   No results for input(s): LIPASE, AMYLASE in the last 168 hours. No results for input(s): AMMONIA in the last 168 hours. Coagulation Profile: No results for input(s): INR, PROTIME in the last 168 hours. Cardiac Enzymes: No results for input(s): CKTOTAL, CKMB, CKMBINDEX, TROPONINI in the last 168 hours. BNP (last 3 results) No results for input(s): PROBNP in the last 8760 hours. HbA1C: No results for input(s): HGBA1C in the last 72 hours. CBG: No results for input(s): GLUCAP in the last 168 hours. Lipid Profile: No results for input(s): CHOL, HDL, LDLCALC, TRIG, CHOLHDL, LDLDIRECT in the last 72 hours. Thyroid Function Tests: No results for input(s): TSH, T4TOTAL, FREET4, T3FREE, THYROIDAB in the last 72 hours. Anemia Panel: No results for input(s): VITAMINB12, FOLATE, FERRITIN, TIBC, IRON , RETICCTPCT in the last 72 hours. Urine analysis:    Component Value Date/Time   COLORURINE YELLOW 09/06/2023 1400   APPEARANCEUR CLEAR 09/06/2023 1400   LABSPEC 1.010 09/06/2023 1400   PHURINE 5.0 09/06/2023 1400   GLUCOSEU 150 (A) 09/06/2023 1400   HGBUR NEGATIVE 09/06/2023 1400   HGBUR large 06/19/2007 0845   BILIRUBINUR NEGATIVE 09/06/2023 1400   BILIRUBINUR negative 08/05/2021 1334   KETONESUR NEGATIVE 09/06/2023 1400   PROTEINUR NEGATIVE 09/06/2023 1400   UROBILINOGEN 0.2 08/05/2021 1334   UROBILINOGEN 0.2 07/03/2012 1929   NITRITE NEGATIVE  09/06/2023 1400   LEUKOCYTESUR NEGATIVE 09/06/2023 1400   Sepsis Labs: @LABRCNTIP (procalcitonin:4,lacticidven:4)  ) Recent Results (from the past 240 hours)  Resp panel by RT-PCR (RSV, Flu A&B, Covid) Anterior Nasal Swab     Status: None   Collection Time: 09/05/23  4:33 PM   Specimen: Anterior Nasal Swab  Result Value Ref Range Status   SARS Coronavirus 2 by RT PCR NEGATIVE NEGATIVE Final    Comment: (NOTE) SARS-CoV-2 target nucleic acids are NOT DETECTED.  The SARS-CoV-2 RNA is generally detectable in upper respiratory specimens during the acute phase of infection. The lowest concentration of SARS-CoV-2 viral copies this assay can detect is 138 copies/mL. A negative result does not preclude SARS-Cov-2 infection and should not be used as the sole basis for treatment or other patient management decisions. A negative result may  occur with  improper specimen collection/handling, submission of specimen other than nasopharyngeal swab, presence of viral mutation(s) within the areas targeted by this assay, and inadequate number of viral copies(<138 copies/mL). A negative result must be combined with clinical observations, patient history, and epidemiological information. The expected result is Negative.  Fact Sheet for Patients:  BloggerCourse.com  Fact Sheet for Healthcare Providers:  SeriousBroker.it  This test is no t yet approved or cleared by the [place]  FDA and  has been authorized for detection and/or diagnosis of SARS-CoV-2 by FDA under an Emergency Use Authorization (EUA). This EUA will remain  in effect (meaning this test can be used) for the duration of the COVID-19 declaration under Section 564(b)(1) of the Act, 21 U.S.C.section 360bbb-3(b)(1), unless the authorization is terminated  or revoked sooner.       Influenza A by PCR NEGATIVE NEGATIVE Final   Influenza B by PCR NEGATIVE NEGATIVE Final    Comment:  (NOTE) The Xpert Xpress SARS-CoV-2/FLU/RSV plus assay is intended as an aid in the diagnosis of influenza from Nasopharyngeal swab specimens and should not be used as a sole basis for treatment. Nasal washings and aspirates are unacceptable for Xpert Xpress SARS-CoV-2/FLU/RSV testing.  Fact Sheet for Patients: BloggerCourse.com  Fact Sheet for Healthcare Providers: SeriousBroker.it  This test is not yet approved or cleared by the [place]  FDA and has been authorized for detection and/or diagnosis of SARS-CoV-2 by FDA under an Emergency Use Authorization (EUA). This EUA will remain in effect (meaning this test can be used) for the duration of the COVID-19 declaration under Section 564(b)(1) of the Act, 21 U.S.C. section 360bbb-3(b)(1), unless the authorization is terminated or revoked.     Resp Syncytial Virus by PCR NEGATIVE NEGATIVE Final    Comment: (NOTE) Fact Sheet for Patients: BloggerCourse.com  Fact Sheet for Healthcare Providers: SeriousBroker.it  This test is not yet approved or cleared by the [place]  FDA and has been authorized for detection and/or diagnosis of SARS-CoV-2 by FDA under an Emergency Use Authorization (EUA). This EUA will remain in effect (meaning this test can be used) for the duration of the COVID-19 declaration under Section 564(b)(1) of the Act, 21 U.S.C. section 360bbb-3(b)(1), unless the authorization is terminated or revoked.  Performed at [facility], [address], [place], [place] [address]   MRSA Next Gen by PCR, Nasal     Status: None   Collection Time: 09/06/23  5:45 PM   Specimen: Nasal Mucosa; Nasal Swab  Result Value Ref Range Status   MRSA by PCR Next Gen NOT DETECTED NOT DETECTED Final    Comment: (NOTE) The GeneXpert MRSA Assay (FDA approved for NASAL specimens only), is one component of a comprehensive MRSA  colonization surveillance program. It is not intended to diagnose MRSA infection nor to guide or monitor treatment for MRSA infections. Test performance is not FDA approved in patients less than 29 years old. Performed at [facility], [address] [place], [place] [address]      Radiology Studies: No results found.   Scheduled Meds:  acyclovir   400 mg Oral BID   allopurinol   300 mg Oral Daily   apixaban   5 mg Oral BID   Chlorhexidine  Gluconate Cloth  6 each Topical Daily   sodium chloride  flush  3 mL Intravenous Q12H   Continuous Infusions:  milrinone  0.25 mcg/kg/min (09/09/23 0429)     LOS: 4 days    Time spent:    Leslie Pac, MD [facility]  09/09/2023, 9:54 AM

## 2023-09-09 NOTE — Progress Notes (Signed)
 Called lab, they will add on the CMP ordered this morning.

## 2023-09-09 NOTE — Plan of Care (Signed)
 Pt received to floor without incident. Aox4, no c/o pain. VSS. Milrinone  gtt infusing. Safety precautions in place, call light within reach. Family at bedside.

## 2023-09-09 NOTE — Consult Note (Addendum)
 Advanced Heart Failure Team Consult Note   Primary Physician: Carlette Benita Area, MD Cardiologist:  None  Reason for Consultation: Acute on chronic systolic CHF with cardiogenic shock  HPI:    Leslie Williamson is seen today for evaluation of acute on chronic systolic CHF with cardiogenic shock at the request of Dr. Alvan with [facility].   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 Other history includes HFmrEF.   Admitted with chest pain  in 05/24. HS troponin peaked at ~1100. Echo with EF 50% and newly reduced RV. CTA demonstrated b/l severe occlusive pulmonary emboli. She was anticoagulated. Cardiac cath not pursued. Elevated troponin felt to be more likely 2/2 high burden of PE.  She completed 6 cycles of R mini CHOP for B-cell lymphoma in 05/25. PET from 06/25 with mild increase in hypermetabolic activity involving soft tissue mass in RLE and gastric fundal hypermetabolism w/ persistent wall thickening. She was referred to GI for ED and radiation Oncology for ISRT.   She presented to [facility] ED 09/05/23 with chest discomfort, shortness of breath and nausea that she initially thought was due to URI. In ED, CT C/A/P with bilateral pleural effusions. BNP > 4500. HS troponin 261>272. Had AKI and elevated LFTs. Presentation concerning for acute on chronic CHF with newly reduced EF. She was started on IV lasix . Lactic acid elevated to 3.2. Initial CO-OX 28% and was started on inotrope support with milrinone . Milrinone  was titrated to 0.375 then reduced to 0.25 mcg/kg/min due to runs of NSVT. Lactic acid normalized and renal function/LFTs improved with inotrope support and diuresis. She was transferred to [facility] for evaluation by Advanced Heart Failure.  Her 2 sons are present at  time of evaluation. She was fairly independent in ADLs prior to admission. Reports feeling well. No dyspnea at rest.   Home Medications Prior to Admission medications   Medication Sig Start Date End Date Taking? Authorizing Provider  acyclovir  (ZOVIRAX ) 400 MG tablet Take 1 tablet (400 mg total) by mouth 2 (two) times daily. 12/30/21  Yes Rogers Hai, MD  apixaban  (ELIQUIS ) 5 MG TABS tablet Take 1 tablet (5 mg total) by mouth 2 (two) times daily. 08/05/22  Yes Krishnan, Gokul, MD  atorvastatin  (LIPITOR) 40 MG tablet Take 40 mg by mouth at bedtime. 11/29/22  Yes [provider]  dapagliflozin  propanediol (FARXIGA ) 10 MG TABS tablet Take 1 tablet (10 mg total) by mouth daily. 06/05/22  Yes Jillian Buttery, MD  furosemide  (LASIX ) 40 MG tablet Take 40 mg by mouth daily.   Yes [provider]  metoprolol  succinate (TOPROL -XL) 50 MG 24 hr tablet Take 25 mg by mouth daily. Take with or immediately following a meal.   Yes [provider]  potassium chloride  SA (KLOR-CON  M) 20 MEQ tablet Take 20 mEq by mouth daily.   Yes [provider]  promethazine-dextromethorphan  (PROMETHAZINE-DM) 6.25-15 MG/5ML syrup Take 5 mLs by mouth 4 (four) times daily as needed for cough.   Yes [provider]  lenalidomide  (REVLIMID ) 10 MG capsule Take 1 capsule (10 mg total) by mouth daily. Take for 21 days on, 7 days off. Patient not taking: Reported on 09/05/2023 03/10/23   Rogers Hai, MD    Past Medical History: Past Medical History:  Diagnosis Date   Anxiety  Aortic atherosclerosis (HCC)    Complication of anesthesia    difficult to wake up   Depression    Heart failure with mildly reduced ejection fraction (HFmrEF) (HCC)    Hypercholesteremia    Hypertension    Hypokalemia    Iron  deficiency anemia    Left hip pain    NHL (non-Hodgkin's lymphoma) (HCC) 12/17/2010   Obesity    Primary thrombocytosis (HCC) 01/11/2006   Secondary to splenectomy      PVD (peripheral vascular disease) (HCC)    S/P splenectomy 01/01/2014   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)    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   Vertigo, labyrinthine     Past Surgical History: Past Surgical History:  Procedure Laterality Date   CATARACT EXTRACTION W/PHACO Left 11/03/2015   Procedure: CATARACT EXTRACTION PHACO AND INTRAOCULAR LENS PLACEMENT (IOC);  Surgeon: Cherene Mania, MD;  Location: [facility];  Service: Ophthalmology;  Laterality: Left;  CDE: 7.74   CATARACT EXTRACTION W/PHACO Right 11/20/2015   Procedure: CATARACT EXTRACTION PHACO AND INTRAOCULAR LENS PLACEMENT ; CDE:  8.30;  Surgeon: Cherene Mania, MD;  Location: [facility];  Service: Ophthalmology;  Laterality: Right;   IR IMAGING GUIDED PORT INSERTION  11/04/2021   MASS EXCISION Right 02/18/2023   Procedure: Incisional Biopsy Right Lower Extremity;  Surgeon: Kallie Manuelita BROCKS, MD;  Location: [facility];  Service: General;  Laterality: Right;   PORT-A-CATH REMOVAL      Family History: Family History  Problem Relation Age of Onset   Diabetes Mother     Social History: Social History   Socioeconomic History   Marital status: Widowed    Spouse name: Not on file   Number of children: Not on file   Years of education: Not on file   Highest education level: Not on file  Occupational History   Not on file  Tobacco Use   Smoking status: Former   Smokeless tobacco: Never  Vaping Use   Vaping status: Never Used  Substance and Sexual Activity   Alcohol  use: No   Drug use: No   Sexual activity: Not Currently  Other Topics Concern   Not on file  Social History Narrative   Not on file   Social Drivers of Health   Financial Resource Strain: Low Risk  (12/31/2019)   Overall Financial Resource Strain (CARDIA)    Difficulty of Paying Living Expenses: Not hard at all  Food Insecurity: No Food Insecurity (09/05/2023)   Hunger Vital Sign    Worried About Running Out of Food in the Last  Year: Never true    Ran Out of Food in the Last Year: Never true  Transportation Needs: No Transportation Needs (09/05/2023)   PRAPARE - Administrator, Civil Service (Medical): No    Lack of Transportation (Non-Medical): No  Physical Activity: Inactive (12/31/2019)   Exercise Vital Sign    Days of Exercise per Week: 0 days    Minutes of Exercise per Session: 0 min  Stress: No Stress Concern Present (12/31/2019)   [employer] of Occupational Health - Occupational Stress Questionnaire    Feeling of Stress : Not at all  Social Connections: Moderately Isolated (09/05/2023)   Social Connection and Isolation Panel    Frequency of Communication with Friends and Family: Once a week    Frequency of Social Gatherings with Friends and Family: Once a week    Attends Religious Services: 1 to 4 times per year  Active Member of Clubs or Organizations: No    Attends Banker Meetings: 1 to 4 times per year    Marital Status: Widowed    Allergies:  Allergies  Allergen Reactions   Penicillins Rash    Objective:    Vital Signs:   Temp:  [97.7 F (36.5 C)-98.3 F (36.8 C)] 98.1 F (36.7 C) (07/18 0400) Pulse Rate:  [102-117] 117 (07/18 0759) Resp:  [13-23] 20 (07/18 0759) BP: (91-107)/(52-91) 104/74 (07/18 0759) SpO2:  [92 %-100 %] 96 % (07/18 0759) Weight:  [54 kg] 54 kg (07/18 0000) Last BM Date : 09/08/23  Weight change: Filed Weights   09/07/23 0415 09/08/23 0500 09/09/23 0000  Weight: 58.2 kg 58.2 kg 54 kg    Intake/Output:   Intake/Output Summary (Last 24 hours) at 09/09/2023 0907 Last data filed at 09/09/2023 0429 Gross per 24 hour  Intake 287.17 ml  Output 1050 ml  Net -762.83 ml      Physical Exam    General:  Elderly female. No distress. Cor: JVP to jaw. Regular rate & rhythm, tachy. No murmurs. Lungs: clear Abdomen: soft, nontender, nondistended.  Extremities: 1+ edema Neuro: alert & orientedx3. Affect pleasant   Telemetry    Atrial tach 110s-120s  Labs   Basic Metabolic Panel: Recent Labs  Lab 09/05/23 1345 09/05/23 1410 09/06/23 0507 09/07/23 0420 09/08/23 0405 09/09/23 0430  NA 140  --  143 144 143 141  K 4.4  --  3.7 3.3* 3.9 3.8  CL 108  --  113* 109 113* 107  CO2 16*  --  20* 21* 22 24  GLUCOSE 115*  --  91 102* 101* 127*  BUN 53*  --  55* 46* 32* 24*  CREATININE 1.91*  --  1.73* 1.58* 1.27* 1.15*  CALCIUM  8.7*  --  8.3* 8.3* 8.3* 8.9  MG  --  2.1  --   --  1.7 1.7  PHOS  --   --  4.5  --   --   --     Liver Function Tests: Recent Labs  Lab 09/05/23 1345 09/06/23 0507 09/06/23 0855 09/07/23 0420 09/08/23 0405 09/09/23 0430  AST 70*  --  70* 52* 31 26  ALT 111*  --  118* 111* 85* 74*  ALKPHOS 170*  --  155* 145* 120 110  BILITOT 1.4*  --  1.6* 1.1 0.9 1.1  PROT 5.5*  --  4.9* 4.6* 4.4* 5.0*  ALBUMIN 3.3* 3.0* 3.1* 3.0* 2.7* 3.1*   No results for input(s): LIPASE, AMYLASE in the last 168 hours. No results for input(s): AMMONIA in the last 168 hours.  CBC: Recent Labs  Lab 09/05/23 1345 09/06/23 0507 09/07/23 0420 09/08/23 0405 09/09/23 0430  WBC 5.6 4.8 5.1 5.0 6.8  NEUTROABS 3.3  --   --   --   --   HGB 9.0* 8.2* 8.6* 8.0* 9.3*  HCT 28.3* 26.7* 26.7* 25.6* 29.2*  MCV 74.9* 74.6* 73.0* 73.8* 72.6*  PLT 363 328 343 331 373    Cardiac Enzymes: No results for input(s): CKTOTAL, CKMB, CKMBINDEX, TROPONINI in the last 168 hours.  BNP: BNP (last 3 results) Recent Labs    09/05/23 1410 09/07/23 0420  BNP >4,500.0* >4,500.0*    ProBNP (last 3 results) No results for input(s): PROBNP in the last 8760 hours.   CBG: No results for input(s): GLUCAP in the last 168 hours.  Coagulation Studies: No results for input(s): LABPROT, INR in the last 72 hours.  Imaging   No results found.   Medications:     Current Medications:  acyclovir   400 mg Oral BID   allopurinol   300 mg Oral Daily   apixaban   5 mg Oral BID   Chlorhexidine   Gluconate Cloth  6 each Topical Daily   sodium chloride  flush  3 mL Intravenous Q12H    Infusions:  milrinone  0.25 mcg/kg/min (09/09/23 0429)      Patient Profile   89 y.o. 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 Admitted with acute on chronic CHF with newly reduced EF and cardiogenic shock in setting of recent Adriamycin .  Assessment/Plan   Acute on chronic CHF with newly reduced EF/cardiogenic shock: -EF previously 45-50% -Echo 07/25: EF 20%, RV okay -CM most likely 2/2 Adriamycin . However, ECG with anterior Qs and hx chest pain on presentation. Cannot rule out ischemic CM. Will plan for cMRI to assess scar burden/ischemic cardiomyopathy. If large scar burden, EF unlikely to recover -CO-OX low at 48% on 0.25 milrinone . Hower, lactic acid has cleared and renal function/LFTs improved. Recheck.  -Appears volume up. Continue IV lasix  80 BID -Start spiro 12.5 mg daily -Start digoxin 0.0625 mg daily -She is tenuous. Tachycardic with soft BP. Not sure if she will tolerate milrinone  wean. Palliative Care consulted for GOC discussions.  2. B cell lymphoma - S/p RCHOP -PET from 06/25 with mild increase in hypermetabolic activity involving soft tissue mass in RLE  -Planning for radiation therapy in the future  3. AKI on CKD IIIa -Scr baseline around 1.1, Cr up to 1.9 on admit -Improving with inotrope support and diuresis  4. Elevated LFTs -Likely d/t passive congestion and cardiogenic shock -Improving  5. Hx PE -On Eliquis   Length of Stay: 4  FINCH, LINDSAY N, PA-C  09/09/2023, 9:07 AM  Advanced Heart Failure Team Pager [phone] (M-F; 7a - 5p)  Please contact [facility] for night-coverage after hours (4p -7a ) and weekends on [email or web address]   Patient seen and examined with the above-signed Advanced Practice Provider and/or Housestaff. I personally reviewed laboratory  data, imaging studies and relevant notes. I independently examined the patient and formulated the important aspects of the plan. I have edited the note to reflect any of my changes or salient points. I have personally discussed the plan with the patient and/or family.  89 y/o woman with B-cell lymphoma s/p recent treatment with R-CHOP with incomplete response with residual disease in leg (pending radiation treatment) as well as MGUS/multiple myeloma with minimal M-spike. .  Now admitted with low output HF requiring milrinone . Echo with new onset biventricular dysfunction. EF 20%  No anginal symptoms but ECG with new anterior Qs.   Previously very independent.  Two sons at bedside  General:  Elderly woman lying in bed No resp difficulty HEENT: normal Neck: supple. JVP to jaw   Cor: Rg tachy  Lungs: clear Abdomen: soft, nontender, nondistended. No hepatosplenomegaly. No bruits or masses. Good bowel sounds. Extremities: no cyanosis, clubbing, rash, 1+ edema Neuro: alert & orientedx3, cranial nerves grossly intact. moves all 4 extremities w/o difficulty. Affect pleasant  Difficult situation.   She has severe LV dysfunction with low output HF. Given her independent status has chosen an aggressive course of treatment despite her advanced age and persistent lymphoma. I d/w Oncology and her 2 year survival rate remains ~50% so her HF is now the biggest threat to her longevity  and QoL.   Given time course, suspect her cardiomyopathy is likely related to adriamycin  toxicity from R-CHOP but ECG is also concerning for previous anterior infarct That said, has had no angina so feel much less likely. Given her age would like to defer cath and will proceed with cMRI to better define cause of CM and help us  with prognosis. If she has evidence of a large anterior MI then HF unlikely to get much better with GDMT but if not then may respond.to GDMT.   I discussed with her family  that milrinone  is a short term  agent to help her heart but that in the longer term it is associated with a worse prognosis. Will continue milrinone  for now and try slow introduction of GDMT with wean of milrinone . Continue diuresis as she is volume overloaded.   If not responding,will need to reconsider a more Palliative approach.   Toribio Fuel, MD  6:14 PM

## 2023-09-09 NOTE — Progress Notes (Signed)
 This chaplain responded to Pt. consult for spiritual care. The Pt. is awake and accepting of the chaplain's introduction as her breakfast is delivered. The Pt. son-James is at the bedside. The Pt. speaks positively of [place] supportive role in her life.  The chaplain started building rapport by asking about the transfer from [facility]. The Pt. shares a positive experience and a good night's sleep. The Pt. includes it is time for her to return home. The Pt. doesn't think she has received any visits from medical team this morning. The chaplain took the opportunity to connect the Palliative Care team at [facility] with Jolynn Pack.  The Pt. Accepted the chaplain's invitation for intercessory prayer and F/U spiritual care.  Chaplain Leeroy Sebastian Leeroy Sebastian [phone]

## 2023-09-10 DIAGNOSIS — I5023 Acute on chronic systolic (congestive) heart failure: Secondary | ICD-10-CM | POA: Diagnosis not present

## 2023-09-10 DIAGNOSIS — I10 Essential (primary) hypertension: Secondary | ICD-10-CM

## 2023-09-10 DIAGNOSIS — R000 Tachycardia, unspecified: Secondary | ICD-10-CM

## 2023-09-10 LAB — COMPREHENSIVE METABOLIC PANEL WITH GFR
ALT: 51 U/L — ABNORMAL HIGH (ref 0–44)
AST: 19 U/L (ref 15–41)
Albumin: 2.9 g/dL — ABNORMAL LOW (ref 3.5–5.0)
Alkaline Phosphatase: 101 U/L (ref 38–126)
Anion gap: 11 (ref 5–15)
BUN: 19 mg/dL (ref 8–23)
CO2: 26 mmol/L (ref 22–32)
Calcium: 8.6 mg/dL — ABNORMAL LOW (ref 8.9–10.3)
Chloride: 106 mmol/L (ref 98–111)
Creatinine, Ser: 1.25 mg/dL — ABNORMAL HIGH (ref 0.44–1.00)
GFR, Estimated: 41 mL/min — ABNORMAL LOW (ref >60)
Glucose, Bld: 121 mg/dL — ABNORMAL HIGH (ref 70–99)
Potassium: 3.4 mmol/L — ABNORMAL LOW (ref 3.5–5.1)
Sodium: 143 mmol/L (ref 135–145)
Total Bilirubin: 1 mg/dL (ref 0.0–1.2)
Total Protein: 4.7 g/dL — ABNORMAL LOW (ref 6.5–8.1)

## 2023-09-10 LAB — COOXEMETRY PANEL
Carboxyhemoglobin: 1.5 % (ref 0.5–1.5)
Methemoglobin: <0.7 % (ref 0.0–1.5)
O2 Saturation: 53.2 %
Total hemoglobin: 9 g/dL — ABNORMAL LOW (ref 12.0–16.0)

## 2023-09-10 LAB — CBC
HCT: 27.3 % — ABNORMAL LOW (ref 36.0–46.0)
Hemoglobin: 8.7 g/dL — ABNORMAL LOW (ref 12.0–15.0)
MCH: 23.2 pg — ABNORMAL LOW (ref 26.0–34.0)
MCHC: 31.9 g/dL (ref 30.0–36.0)
MCV: 72.8 fL — ABNORMAL LOW (ref 80.0–100.0)
Platelets: 371 K/uL (ref 150–400)
RBC: 3.75 MIL/uL — ABNORMAL LOW (ref 3.87–5.11)
RDW: 19.7 % — ABNORMAL HIGH (ref 11.5–15.5)
WBC: 5.1 K/uL (ref 4.0–10.5)
nRBC: 4.5 % — ABNORMAL HIGH (ref 0.0–0.2)

## 2023-09-10 LAB — MAGNESIUM: Magnesium: 1.7 mg/dL (ref 1.7–2.4)

## 2023-09-10 MED ORDER — MAGNESIUM SULFATE 2 GM/50ML IV SOLN
2.0000 g | Freq: Once | INTRAVENOUS | Status: AC
  Administered 2023-09-10: 2 g via INTRAVENOUS
  Filled 2023-09-10: qty 50

## 2023-09-10 NOTE — Progress Notes (Signed)
 Progress Note  Patient Name: Leslie Williamson Date of Encounter: 09/10/2023  Primary Cardiologist: None   Subjective   Patient seen and examined at her bedside her family was by her bedside when I arrived.  She was sleeping but open her eyes to voice.  Inpatient Medications    Scheduled Meds:  acyclovir   400 mg Oral BID   allopurinol   300 mg Oral Daily   apixaban   5 mg Oral BID   Chlorhexidine  Gluconate Cloth  6 each Topical Daily   digoxin   0.0625 mg Oral Daily   potassium chloride   40 mEq Oral Once   sodium chloride  flush  3 mL Intravenous Q12H   spironolactone   12.5 mg Oral Daily   Continuous Infusions:  milrinone  0.25 mcg/kg/min (09/09/23 2054)   PRN Meds: acetaminophen  **OR** acetaminophen , bisacodyl , guaiFENesin -dextromethorphan , ondansetron  **OR** ondansetron  (ZOFRAN ) IV, polyethylene glycol, sodium chloride  flush, traZODone    Vital Signs    Vitals:   09/10/23 0458 09/10/23 0500 09/10/23 0607 09/10/23 0725  BP:  98/67  (!) 95/59  Pulse: (!) 121   (!) 114  Resp:  17  14  Temp: 97.6 F (36.4 C)   (!) 97.5 F (36.4 C)  TempSrc: Axillary   Oral  SpO2: 98%   100%  Weight:   53.1 kg   Height:        Intake/Output Summary (Last 24 hours) at 09/10/2023 0853 Last data filed at 09/10/2023 0504 Gross per 24 hour  Intake --  Output 1000 ml  Net -1000 ml   Filed Weights   09/08/23 0500 09/09/23 0000 09/10/23 0607  Weight: 58.2 kg 54 kg 53.1 kg    Telemetry    Sinus tachycardia - Personally Reviewed  ECG     - Personally Reviewed  Physical Exam   General: Comfortable Head: Atraumatic, normal size  Eyes: PEERLA, EOMI  Neck: No JVD, she does have a right chest Port-A-Cath Cardiac: Normal S1, S2; RRR; no murmurs, rubs, or gallops Lungs: Clear to auscultation bilaterally Abd: Soft, nontender, no hepatomegaly  Ext: warm, trace lower extremity edema  Labs    Chemistry Recent Labs  Lab 09/09/23 0430 09/09/23 0730 09/10/23 0500  NA 141 138 143   K 3.8 3.8 3.4*  CL 107 104 106  CO2 24 22 26   GLUCOSE 127* 125* 121*  BUN 24* 24* 19  CREATININE 1.15* 1.28* 1.25*  CALCIUM  8.9 8.9 8.6*  PROT 5.0* 5.1* 4.7*  ALBUMIN 3.1* 3.1* 2.9*  AST 26 27 19   ALT 74* 73* 51*  ALKPHOS 110 112 101  BILITOT 1.1 0.9 1.0  GFRNONAA 45* 40* 41*  ANIONGAP 10 12 11      Hematology Recent Labs  Lab 09/08/23 0405 09/09/23 0430 09/10/23 0500  WBC 5.0 6.8 5.1  RBC 3.47* 4.02 3.75*  HGB 8.0* 9.3* 8.7*  HCT 25.6* 29.2* 27.3*  MCV 73.8* 72.6* 72.8*  MCH 23.1* 23.1* 23.2*  MCHC 31.3 31.8 31.9  RDW 19.3* 19.4* 19.7*  PLT 331 373 371    Cardiac EnzymesNo results for input(s): TROPONINI in the last 168 hours. No results for input(s): TROPIPOC in the last 168 hours.   BNP Recent Labs  Lab 09/05/23 1410 09/07/23 0420  BNP >4,500.0* >4,500.0*     DDimer No results for input(s): DDIMER in the last 168 hours.   Radiology    MR CARDIAC MORPHOLOGY W WO CONTRAST Result Date: 09/09/2023 CLINICAL DATA:  46F with multiple myeloma, lymphoma, PE, CKD p/w cardiogenic shock. Echo with EF 20%,  normal RV function EXAM: CARDIAC MRI TECHNIQUE: The patient was scanned on a 1.5 Tesla Siemens magnet. A dedicated cardiac coil was used. Functional imaging was done using Fiesta sequences. 2,3, and 4 chamber views were done to assess for RWMA's. Modified Simpson's rule using a short axis stack was used to calculate an ejection fraction on a dedicated work [employer]. The patient received 10 cc of Gadavist . After 10 minutes inversion recovery sequences were used to assess for infiltration and scar tissue. Phase contrast velocity mapping was performed above the aortic and pulmonic valves CONTRAST:  10 cc  of Gadavist  FINDINGS: Left ventricle: -Mild dilatation -Severe systolic dysfunction -Elevated ECV (38%) -RV insertion site LGE LV EF:  22% (Normal 52-79%) Absolute volumes: LV EDV: (Normal 78-167 mL) LV ESV: (Normal 21-64 mL) LV SV: 37mL  (Normal 52-114 mL) CO: 4.5L/min (Normal 2.7-6.3 L/min) Indexed volumes: LV EDV: 156mL/sq-m (Normal 50-96 mL/sq-m) LV ESV: 63mL/sq-m (Normal 10-40 mL/sq-m) LV SV: 33mL/sq-m (Normal 33-64 mL/sq-m) CI: 3.0L/min/sq-m (Normal 1.9-3.9 L/min/sq-m) Right ventricle: Mild dilatation with severe systolic dysfunction RV EF: 28% (Normal 52-80%) Absolute volumes: RV EDV: (Normal 79-175 mL) RV ESV: (Normal 13-75 mL) RV SV: 48mL (Normal 56-110 mL) CO: 5.8L/min (Normal 2.7-6 L/min) Indexed volumes: RV EDV: 181mL/sq-m (Normal 51-97 mL/sq-m) RV ESV: 47mL/sq-m (Normal 9-42 mL/sq-m) RV SV: 3mL/sq-m (Normal 35-61 mL/sq-m) CI: 3.9L/min/sq-m (Normal 1.8-3.8 L/min/sq-m) Left atrium: Moderate enlargement Right atrium: Mild enlargement Mitral valve: Moderate regurgitation (regurgitant fraction 35%) Aortic valve: Mild regurgitation Tricuspid valve: Moderate regurgitation Pulmonic valve: Trivial regurgitation Aorta: Normal proximal ascending aorta Pericardium: Small effusion IMPRESSION: 1.  Mild LV dilatation with severe systolic dysfunction (EF 22%) 2.  Mild RV dilatation with severe systolic dysfunction (EF 28%) 3. RV insertion site LGE, which is a nonspecific scar pattern often seen in setting of elevated pulmonary pressures 4.  Moderate mitral regurgitation (regurgitant fraction 35%) Electronically Signed   By: Lonni Nanas M.D.   On: 09/09/2023 18:24   MR CARDIAC VELOCITY FLOW MAP Result Date: 09/09/2023 CLINICAL DATA:  54F with multiple myeloma, lymphoma, PE, CKD p/w cardiogenic shock. Echo with EF 20%, normal RV function EXAM: CARDIAC MRI TECHNIQUE: The patient was scanned on a 1.5 Tesla Siemens magnet. A dedicated cardiac coil was used. Functional imaging was done using Fiesta sequences. 2,3, and 4 chamber views were done to assess for RWMA's. Modified Simpson's rule using a short axis stack was used to calculate an ejection fraction on a dedicated work [employer], [employer]. The patient received 10 cc  of Gadavist . After 10 minutes inversion recovery sequences were used to assess for infiltration and scar tissue. Phase contrast velocity mapping was performed above the aortic and pulmonic valves CONTRAST:  10 cc  of Gadavist  FINDINGS: Left ventricle: -Mild dilatation -Severe systolic dysfunction -Elevated ECV (38%) -RV insertion site LGE LV EF:  22% (Normal 52-79%) Absolute volumes: LV EDV: (Normal 78-167 mL) LV ESV: (Normal 21-64 mL) LV SV: 37mL (Normal 52-114 mL) CO: 4.5L/min (Normal 2.7-6.3 L/min) Indexed volumes: LV EDV: 162mL/sq-m (Normal 50-96 mL/sq-m) LV ESV: 75mL/sq-m (Normal 10-40 mL/sq-m) LV SV: 72mL/sq-m (Normal 33-64 mL/sq-m) CI: 3.0L/min/sq-m (Normal 1.9-3.9 L/min/sq-m) Right ventricle: Mild dilatation with severe systolic dysfunction RV EF: 28% (Normal 52-80%) Absolute volumes: RV EDV: (Normal 79-175 mL) RV ESV: (Normal 13-75 mL) RV SV: 48mL (Normal 56-110 mL) CO: 5.8L/min (Normal 2.7-6 L/min) Indexed volumes: RV EDV: 178mL/sq-m (Normal 51-97 mL/sq-m) RV ESV: 56mL/sq-m (Normal 9-42 mL/sq-m) RV SV: 66mL/sq-m (Normal 35-61  mL/sq-m) CI: 3.9L/min/sq-m (Normal 1.8-3.8 L/min/sq-m) Left atrium: Moderate enlargement Right atrium: Mild enlargement Mitral valve: Moderate regurgitation (regurgitant fraction 35%) Aortic valve: Mild regurgitation Tricuspid valve: Moderate regurgitation Pulmonic valve: Trivial regurgitation Aorta: Normal proximal ascending aorta Pericardium: Small effusion IMPRESSION: 1.  Mild LV dilatation with severe systolic dysfunction (EF 22%) 2.  Mild RV dilatation with severe systolic dysfunction (EF 28%) 3. RV insertion site LGE, which is a nonspecific scar pattern often seen in setting of elevated pulmonary pressures 4.  Moderate mitral regurgitation (regurgitant fraction 35%) Electronically Signed   By: Lonni Nanas M.D.   On: 09/09/2023 18:24   MR CARDIAC VELOCITY FLOW MAP Result Date: 09/09/2023 CLINICAL DATA:  9F with multiple myeloma, lymphoma, PE,  CKD p/w cardiogenic shock. Echo with EF 20%, normal RV function EXAM: CARDIAC MRI TECHNIQUE: The patient was scanned on a 1.5 Tesla Siemens magnet. A dedicated cardiac coil was used. Functional imaging was done using Fiesta sequences. 2,3, and 4 chamber views were done to assess for RWMA's. Modified Simpson's rule using a short axis stack was used to calculate an ejection fraction on a dedicated work [employer], [employer]. The patient received 10 cc of Gadavist . After 10 minutes inversion recovery sequences were used to assess for infiltration and scar tissue. Phase contrast velocity mapping was performed above the aortic and pulmonic valves CONTRAST:  10 cc  of Gadavist  FINDINGS: Left ventricle: -Mild dilatation -Severe systolic dysfunction -Elevated ECV (38%) -RV insertion site LGE LV EF:  22% (Normal 52-79%) Absolute volumes: LV EDV: (Normal 78-167 mL) LV ESV: (Normal 21-64 mL) LV SV: 37mL (Normal 52-114 mL) CO: 4.5L/min (Normal 2.7-6.3 L/min) Indexed volumes: LV EDV: 165mL/sq-m (Normal 50-96 mL/sq-m) LV ESV: 54mL/sq-m (Normal 10-40 mL/sq-m) LV SV: 5mL/sq-m (Normal 33-64 mL/sq-m) CI: 3.0L/min/sq-m (Normal 1.9-3.9 L/min/sq-m) Right ventricle: Mild dilatation with severe systolic dysfunction RV EF: 28% (Normal 52-80%) Absolute volumes: RV EDV: (Normal 79-175 mL) RV ESV: (Normal 13-75 mL) RV SV: 48mL (Normal 56-110 mL) CO: 5.8L/min (Normal 2.7-6 L/min) Indexed volumes: RV EDV: 183mL/sq-m (Normal 51-97 mL/sq-m) RV ESV: 71mL/sq-m (Normal 9-42 mL/sq-m) RV SV: 38mL/sq-m (Normal 35-61 mL/sq-m) CI: 3.9L/min/sq-m (Normal 1.8-3.8 L/min/sq-m) Left atrium: Moderate enlargement Right atrium: Mild enlargement Mitral valve: Moderate regurgitation (regurgitant fraction 35%) Aortic valve: Mild regurgitation Tricuspid valve: Moderate regurgitation Pulmonic valve: Trivial regurgitation Aorta: Normal proximal ascending aorta Pericardium: Small effusion IMPRESSION: 1.  Mild LV dilatation with severe  systolic dysfunction (EF 22%) 2.  Mild RV dilatation with severe systolic dysfunction (EF 28%) 3. RV insertion site LGE, which is a nonspecific scar pattern often seen in setting of elevated pulmonary pressures 4.  Moderate mitral regurgitation (regurgitant fraction 35%) Electronically Signed   By: Lonni Nanas M.D.   On: 09/09/2023 18:24    Cardiac Studies   Echo and Cardiac MRI  Patient Profile     89 y.o. female   Assessment & Plan    Acute on chronic heart failure with reduced ejection fraction/chemo induced cardiomyopathy (Adriamycin ) Acute renal injury/cardiorenal Transaminitis History of multiple myeloma/non-Hodgkin lymphoma  Her echocardiogram as well as the cardiac MRI has shown severe ventricular depressed ejection fraction.  Previous discussion with patient's family for prognosis has been done with by our advanced heart failure team.  Family in the room today acknowledged this.  They have questions about the cardiac MRI which I was able to share with them. Clinically she does have some evidence of volume overload.  She has been on diuretics, this is being held for  now.   Plans for Stiemycin her medication with GDMT she was started on Aldactone  yesterday, blood pressure appears to be marginal high 90s low 100s systolic.  She is not symptomatic we will keep her on the Aldactone  for now and monitor her blood pressure if continue to drop we will stop this.  Unfortunately this will hinder optimizing her GDMT. She is persistently with sinus tachycardia on the midodrine.  Will keep this for now.  Continue her digoxin  we will get digoxin  level today.  Creatinine compared to yesterday slightly improved 1.25.  Agree with palliative care on board.  As she actually does have poor prognosis.      For questions or updates, please contact [facility] Please consult [email or web address] for contact info under Cardiology/STEMI.      Signed, Siani Utke, DO  09/10/2023, 8:53 AM

## 2023-09-10 NOTE — Progress Notes (Signed)
 PROGRESS NOTE    Leslie Williamson  FMW:[id number] DOB: 1931/07/03 DOA: 09/05/2023 PCP: Carlette Benita Area, MD  89/F w chronic diastolic dysfunction CHF, pulmonary embolism, HTN, small B-cell lymphoma of the spleen/non-Hodgkin's lymphoma, PAD, essential thrombocytosis, JAK2 plus myelo proliferative disorder, MGUS and GERD Presented to [facility] ED with complaints of chest discomfort increasing shortness of breath and nausea for the last couple days. - Workup in the ED noted bilateral pleural effusions, BNP more than 4500, lactic acidosis, elevated creatinine - Echo noted EF of 20%, normal RV - Seen by palliative care, cardiology, started on IV milrinone  and transferred to [facility], arrived after midnight 7/18  Subjective: -feels fair, remains tachy on milrinone , denies any complaints, Cardiac MRI completed yesterday  Assessment and Plan:  Acute systolic CHF Low output failure -2D echo noted EF down to 20%, normal RV, suspected to be secondary to Adriamycin  -Complicated by lactic acidosis, AKI, after discussion with oncology and palliative care she was started on IV milrinone  and transferred to [facility] - Volume status improving, conservative management - CHF team following, started on digoxin , Aldactone , mains tachycardic with soft blood pressures, not a candidate for advanced therapies, palliative care following - Cardiac MRI with low RV and LVEF -Will consult PT   AKI -- cardiorenal, improving on milrinone    Transaminitis -- Second to passive hepatic congestion from CHF, improving   H/o PE -- remains on apixaban     Multiple Myeloma  Non-Hodgkins Lymphoma  -- she is followed by Dr. Rogers -- completed 6 cycles of R mini CHOP -- Attending at [facility] Dr.Johnson discussed with oncologist Dr MARLA who knows patient well, he felt  her cancers are well controlled now, myeloma has been quiescent and being followed by him, says she likely developed heart failure from adriamycin  therapy which is  now completed and now to complete remainder of lymphoma treatment with radiation therapy to the leg -- he recommended continue aggressive medical management of heart failure   Chronic anemia -Likely multifactorial, overall improving, monitor   DVT prophylaxis: apixaban   Code Status: DNR  Communication: Granddaughter at bedside Disposition Plan: Home likely next week  Consultants: CHF team   Procedures:   Antimicrobials:    Objective: Vitals:   09/10/23 0458 09/10/23 0500 09/10/23 0607 09/10/23 0725  BP:  98/67  (!) 95/59  Pulse: (!) 121   (!) 114  Resp:  17  14  Temp: 97.6 F (36.4 C)   (!) 97.5 F (36.4 C)  TempSrc: Axillary   Oral  SpO2: 98%   100%  Weight:   53.1 kg   Height:        Intake/Output Summary (Last 24 hours) at 09/10/2023 1033 Last data filed at 09/10/2023 0930 Gross per 24 hour  Intake 0 ml  Output 1000 ml  Net -1000 ml   Filed Weights   09/08/23 0500 09/09/23 0000 09/10/23 9392  Weight: 58.2 kg 54 kg 53.1 kg    Examination:  General exam: Chronically ill elderly female laying in bed, AAO x 2, mild cognitive deficits HEENT: Positive JVD, Port-A-Cath noted CVS: S1-S2, regular rhythm Lungs: Decreased breath sounds at the bases Abdomen: Soft, nontender, bowel sounds present Remedies: Trace edema Skin: No rashes Psychiatry: Flat affect    Data Reviewed:   CBC: Recent Labs  Lab 09/05/23 1345 09/06/23 0507 09/07/23 0420 09/08/23 0405 09/09/23 0430 09/10/23 0500  WBC 5.6 4.8 5.1 5.0 6.8 5.1  NEUTROABS 3.3  --   --   --   --   --  HGB 9.0* 8.2* 8.6* 8.0* 9.3* 8.7*  HCT 28.3* 26.7* 26.7* 25.6* 29.2* 27.3*  MCV 74.9* 74.6* 73.0* 73.8* 72.6* 72.8*  PLT 363 328 343 331 373 371   Basic Metabolic Panel: Recent Labs  Lab 09/05/23 1410 09/06/23 0507 09/07/23 0420 09/08/23 0405 09/09/23 0430 09/09/23 0730 09/10/23 0500  NA  --  143 144 143 141 138 143  K  --  3.7 3.3* 3.9 3.8 3.8 3.4*  CL  --  113* 109 113* 107 104 106  CO2  --   20* 21* 22 24 22 26   GLUCOSE  --  91 102* 101* 127* 125* 121*  BUN  --  55* 46* 32* 24* 24* 19  CREATININE  --  1.73* 1.58* 1.27* 1.15* 1.28* 1.25*  CALCIUM   --  8.3* 8.3* 8.3* 8.9 8.9 8.6*  MG 2.1  --   --  1.7 1.7  --  1.7  PHOS  --  4.5  --   --   --   --   --    GFR: Estimated Creatinine Clearance: 21.5 mL/min (A) (by C-G formula based on SCr of 1.25 mg/dL (H)). Liver Function Tests: Recent Labs  Lab 09/07/23 0420 09/08/23 0405 09/09/23 0430 09/09/23 0730 09/10/23 0500  AST 52* 31 26 27 19   ALT 111* 85* 74* 73* 51*  ALKPHOS 145* 120 110 112 101  BILITOT 1.1 0.9 1.1 0.9 1.0  PROT 4.6* 4.4* 5.0* 5.1* 4.7*  ALBUMIN 3.0* 2.7* 3.1* 3.1* 2.9*   No results for input(s): LIPASE, AMYLASE in the last 168 hours. No results for input(s): AMMONIA in the last 168 hours. Coagulation Profile: No results for input(s): INR, PROTIME in the last 168 hours. Cardiac Enzymes: No results for input(s): CKTOTAL, CKMB, CKMBINDEX, TROPONINI in the last 168 hours. BNP (last 3 results) No results for input(s): PROBNP in the last 8760 hours. HbA1C: No results for input(s): HGBA1C in the last 72 hours. CBG: No results for input(s): GLUCAP in the last 168 hours. Lipid Profile: No results for input(s): CHOL, HDL, LDLCALC, TRIG, CHOLHDL, LDLDIRECT in the last 72 hours. Thyroid Function Tests: No results for input(s): TSH, T4TOTAL, FREET4, T3FREE, THYROIDAB in the last 72 hours. Anemia Panel: No results for input(s): VITAMINB12, FOLATE, FERRITIN, TIBC, IRON , RETICCTPCT in the last 72 hours. Urine analysis:    Component Value Date/Time   COLORURINE YELLOW 09/06/2023 1400   APPEARANCEUR CLEAR 09/06/2023 1400   LABSPEC 1.010 09/06/2023 1400   PHURINE 5.0 09/06/2023 1400   GLUCOSEU 150 (A) 09/06/2023 1400   HGBUR NEGATIVE 09/06/2023 1400   HGBUR large 06/19/2007 0845   BILIRUBINUR NEGATIVE 09/06/2023 1400   BILIRUBINUR negative 08/05/2021  1334   KETONESUR NEGATIVE 09/06/2023 1400   PROTEINUR NEGATIVE 09/06/2023 1400   UROBILINOGEN 0.2 08/05/2021 1334   UROBILINOGEN 0.2 07/03/2012 1929   NITRITE NEGATIVE 09/06/2023 1400   LEUKOCYTESUR NEGATIVE 09/06/2023 1400   Sepsis Labs: @LABRCNTIP (procalcitonin:4,lacticidven:4)  ) Recent Results (from the past 240 hours)  Resp panel by RT-PCR (RSV, Flu A&B, Covid) Anterior Nasal Swab     Status: None   Collection Time: 09/05/23  4:33 PM   Specimen: Anterior Nasal Swab  Result Value Ref Range Status   SARS Coronavirus 2 by RT PCR NEGATIVE NEGATIVE Final    Comment: (NOTE) SARS-CoV-2 target nucleic acids are NOT DETECTED.  The SARS-CoV-2 RNA is generally detectable in upper respiratory specimens during the acute phase of infection. The lowest concentration of SARS-CoV-2 viral copies this assay can detect is 138  copies/mL. A negative result does not preclude SARS-Cov-2 infection and should not be used as the sole basis for treatment or other patient management decisions. A negative result may occur with  improper specimen collection/handling, submission of specimen other than nasopharyngeal swab, presence of viral mutation(s) within the areas targeted by this assay, and inadequate number of viral copies(<138 copies/mL). A negative result must be combined with clinical observations, patient history, and epidemiological information. The expected result is Negative.  Fact Sheet for Patients:  BloggerCourse.com  Fact Sheet for Healthcare Providers:  SeriousBroker.it  This test is no t yet approved or cleared by the [place]  FDA and  has been authorized for detection and/or diagnosis of SARS-CoV-2 by FDA under an Emergency Use Authorization (EUA). This EUA will remain  in effect (meaning this test can be used) for the duration of the COVID-19 declaration under Section 564(b)(1) of the Act, 21 U.S.C.section 360bbb-3(b)(1),  unless the authorization is terminated  or revoked sooner.       Influenza A by PCR NEGATIVE NEGATIVE Final   Influenza B by PCR NEGATIVE NEGATIVE Final    Comment: (NOTE) The Xpert Xpress SARS-CoV-2/FLU/RSV plus assay is intended as an aid in the diagnosis of influenza from Nasopharyngeal swab specimens and should not be used as a sole basis for treatment. Nasal washings and aspirates are unacceptable for Xpert Xpress SARS-CoV-2/FLU/RSV testing.  Fact Sheet for Patients: BloggerCourse.com  Fact Sheet for Healthcare Providers: SeriousBroker.it  This test is not yet approved or cleared by the [place]  FDA and has been authorized for detection and/or diagnosis of SARS-CoV-2 by FDA under an Emergency Use Authorization (EUA). This EUA will remain in effect (meaning this test can be used) for the duration of the COVID-19 declaration under Section 564(b)(1) of the Act, 21 U.S.C. section 360bbb-3(b)(1), unless the authorization is terminated or revoked.     Resp Syncytial Virus by PCR NEGATIVE NEGATIVE Final    Comment: (NOTE) Fact Sheet for Patients: BloggerCourse.com  Fact Sheet for Healthcare Providers: SeriousBroker.it  This test is not yet approved or cleared by the [place]  FDA and has been authorized for detection and/or diagnosis of SARS-CoV-2 by FDA under an Emergency Use Authorization (EUA). This EUA will remain in effect (meaning this test can be used) for the duration of the COVID-19 declaration under Section 564(b)(1) of the Act, 21 U.S.C. section 360bbb-3(b)(1), unless the authorization is terminated or revoked.  Performed at [facility], [address], [place], [place] [address]   MRSA Next Gen by PCR, Nasal     Status: None   Collection Time: 09/06/23  5:45 PM   Specimen: Nasal Mucosa; Nasal Swab  Result Value Ref Range Status   MRSA by PCR Next Gen  NOT DETECTED NOT DETECTED Final    Comment: (NOTE) The GeneXpert MRSA Assay (FDA approved for NASAL specimens only), is one component of a comprehensive MRSA colonization surveillance program. It is not intended to diagnose MRSA infection nor to guide or monitor treatment for MRSA infections. Test performance is not FDA approved in patients less than 105 years old. Performed at [facility], [address] [place], [place] [address]      Radiology Studies: MR CARDIAC MORPHOLOGY W WO CONTRAST Result Date: 09/09/2023 CLINICAL DATA:  61F with multiple myeloma, lymphoma, PE, CKD p/w cardiogenic shock. Echo with EF 20%, normal RV function EXAM: CARDIAC MRI TECHNIQUE: The patient was scanned on a 1.5 Tesla Siemens magnet. A dedicated cardiac coil was used. Functional imaging was done  using Fiesta sequences. 2,3, and 4 chamber views were done to assess for RWMA's. Modified Simpson's rule using a short axis stack was used to calculate an ejection fraction on a dedicated work [employer] officer, [employer]. The patient received 10 cc of Gadavist . After 10 minutes inversion recovery sequences were used to assess for infiltration and scar tissue. Phase contrast velocity mapping was performed above the aortic and pulmonic valves CONTRAST:  10 cc  of Gadavist  FINDINGS: Left ventricle: -Mild dilatation -Severe systolic dysfunction -Elevated ECV (38%) -RV insertion site LGE LV EF:  22% (Normal 52-79%) Absolute volumes: LV EDV: (Normal 78-167 mL) LV ESV: (Normal 21-64 mL) LV SV: 37mL (Normal 52-114 mL) CO: 4.5L/min (Normal 2.7-6.3 L/min) Indexed volumes: LV EDV: 151mL/sq-m (Normal 50-96 mL/sq-m) LV ESV: 60mL/sq-m (Normal 10-40 mL/sq-m) LV SV: 35mL/sq-m (Normal 33-64 mL/sq-m) CI: 3.0L/min/sq-m (Normal 1.9-3.9 L/min/sq-m) Right ventricle: Mild dilatation with severe systolic dysfunction RV EF: 28% (Normal 52-80%) Absolute volumes: RV EDV: (Normal 79-175 mL) RV ESV: (Normal 13-75 mL) RV SV: 48mL  (Normal 56-110 mL) CO: 5.8L/min (Normal 2.7-6 L/min) Indexed volumes: RV EDV: 164mL/sq-m (Normal 51-97 mL/sq-m) RV ESV: 79mL/sq-m (Normal 9-42 mL/sq-m) RV SV: 100mL/sq-m (Normal 35-61 mL/sq-m) CI: 3.9L/min/sq-m (Normal 1.8-3.8 L/min/sq-m) Left atrium: Moderate enlargement Right atrium: Mild enlargement Mitral valve: Moderate regurgitation (regurgitant fraction 35%) Aortic valve: Mild regurgitation Tricuspid valve: Moderate regurgitation Pulmonic valve: Trivial regurgitation Aorta: Normal proximal ascending aorta Pericardium: Small effusion IMPRESSION: 1.  Mild LV dilatation with severe systolic dysfunction (EF 22%) 2.  Mild RV dilatation with severe systolic dysfunction (EF 28%) 3. RV insertion site LGE, which is a nonspecific scar pattern often seen in setting of elevated pulmonary pressures 4.  Moderate mitral regurgitation (regurgitant fraction 35%) Electronically Signed   By: Lonni Nanas M.D.   On: 09/09/2023 18:24   MR CARDIAC VELOCITY FLOW MAP Result Date: 09/09/2023 CLINICAL DATA:  54F with multiple myeloma, lymphoma, PE, CKD p/w cardiogenic shock. Echo with EF 20%, normal RV function EXAM: CARDIAC MRI TECHNIQUE: The patient was scanned on a 1.5 Tesla Siemens magnet. A dedicated cardiac coil was used. Functional imaging was done using Fiesta sequences. 2,3, and 4 chamber views were done to assess for RWMA's. Modified Simpson's rule using a short axis stack was used to calculate an ejection fraction on a dedicated work [employer] officer, trade [employer]. The patient received 10 cc of Gadavist . After 10 minutes inversion recovery sequences were used to assess for infiltration and scar tissue. Phase contrast velocity mapping was performed above the aortic and pulmonic valves CONTRAST:  10 cc  of Gadavist  FINDINGS: Left ventricle: -Mild dilatation -Severe systolic dysfunction -Elevated ECV (38%) -RV insertion site LGE LV EF:  22% (Normal 52-79%) Absolute volumes: LV EDV: (Normal 78-167 mL) LV ESV:  (Normal 21-64 mL) LV SV: 37mL (Normal 52-114 mL) CO: 4.5L/min (Normal 2.7-6.3 L/min) Indexed volumes: LV EDV: 134mL/sq-m (Normal 50-96 mL/sq-m) LV ESV: 61mL/sq-m (Normal 10-40 mL/sq-m) LV SV: 11mL/sq-m (Normal 33-64 mL/sq-m) CI: 3.0L/min/sq-m (Normal 1.9-3.9 L/min/sq-m) Right ventricle: Mild dilatation with severe systolic dysfunction RV EF: 28% (Normal 52-80%) Absolute volumes: RV EDV: (Normal 79-175 mL) RV ESV: (Normal 13-75 mL) RV SV: 48mL (Normal 56-110 mL) CO: 5.8L/min (Normal 2.7-6 L/min) Indexed volumes: RV EDV: 127mL/sq-m (Normal 51-97 mL/sq-m) RV ESV: 5mL/sq-m (Normal 9-42 mL/sq-m) RV SV: 69mL/sq-m (Normal 35-61 mL/sq-m) CI: 3.9L/min/sq-m (Normal 1.8-3.8 L/min/sq-m) Left atrium: Moderate enlargement Right atrium: Mild enlargement Mitral valve: Moderate regurgitation (regurgitant fraction 35%) Aortic valve: Mild regurgitation Tricuspid valve:  Moderate regurgitation Pulmonic valve: Trivial regurgitation Aorta: Normal proximal ascending aorta Pericardium: Small effusion IMPRESSION: 1.  Mild LV dilatation with severe systolic dysfunction (EF 22%) 2.  Mild RV dilatation with severe systolic dysfunction (EF 28%) 3. RV insertion site LGE, which is a nonspecific scar pattern often seen in setting of elevated pulmonary pressures 4.  Moderate mitral regurgitation (regurgitant fraction 35%) Electronically Signed   By: Lonni Nanas M.D.   On: 09/09/2023 18:24   MR CARDIAC VELOCITY FLOW MAP Result Date: 09/09/2023 CLINICAL DATA:  39F with multiple myeloma, lymphoma, PE, CKD p/w cardiogenic shock. Echo with EF 20%, normal RV function EXAM: CARDIAC MRI TECHNIQUE: The patient was scanned on a 1.5 Tesla Siemens magnet. A dedicated cardiac coil was used. Functional imaging was done using Fiesta sequences. 2,3, and 4 chamber views were done to assess for RWMA's. Modified Simpson's rule using a short axis stack was used to calculate an ejection fraction on a dedicated work Chief Technology Officer. The patient received 10 cc of Gadavist . After 10 minutes inversion recovery sequences were used to assess for infiltration and scar tissue. Phase contrast velocity mapping was performed above the aortic and pulmonic valves CONTRAST:  10 cc  of Gadavist  FINDINGS: Left ventricle: -Mild dilatation -Severe systolic dysfunction -Elevated ECV (38%) -RV insertion site LGE LV EF:  22% (Normal 52-79%) Absolute volumes: LV EDV: (Normal 78-167 mL) LV ESV: (Normal 21-64 mL) LV SV: 37mL (Normal 52-114 mL) CO: 4.5L/min (Normal 2.7-6.3 L/min) Indexed volumes: LV EDV: 136mL/sq-m (Normal 50-96 mL/sq-m) LV ESV: 14mL/sq-m (Normal 10-40 mL/sq-m) LV SV: 24mL/sq-m (Normal 33-64 mL/sq-m) CI: 3.0L/min/sq-m (Normal 1.9-3.9 L/min/sq-m) Right ventricle: Mild dilatation with severe systolic dysfunction RV EF: 28% (Normal 52-80%) Absolute volumes: RV EDV: (Normal 79-175 mL) RV ESV: (Normal 13-75 mL) RV SV: 48mL (Normal 56-110 mL) CO: 5.8L/min (Normal 2.7-6 L/min) Indexed volumes: RV EDV: 142mL/sq-m (Normal 51-97 mL/sq-m) RV ESV: 27mL/sq-m (Normal 9-42 mL/sq-m) RV SV: 29mL/sq-m (Normal 35-61 mL/sq-m) CI: 3.9L/min/sq-m (Normal 1.8-3.8 L/min/sq-m) Left atrium: Moderate enlargement Right atrium: Mild enlargement Mitral valve: Moderate regurgitation (regurgitant fraction 35%) Aortic valve: Mild regurgitation Tricuspid valve: Moderate regurgitation Pulmonic valve: Trivial regurgitation Aorta: Normal proximal ascending aorta Pericardium: Small effusion IMPRESSION: 1.  Mild LV dilatation with severe systolic dysfunction (EF 22%) 2.  Mild RV dilatation with severe systolic dysfunction (EF 28%) 3. RV insertion site LGE, which is a nonspecific scar pattern often seen in setting of elevated pulmonary pressures 4.  Moderate mitral regurgitation (regurgitant fraction 35%) Electronically Signed   By: Lonni Nanas M.D.   On: 09/09/2023 18:24     Scheduled Meds:  acyclovir   400 mg Oral BID   allopurinol   300 mg  Oral Daily   apixaban   5 mg Oral BID   Chlorhexidine  Gluconate Cloth  6 each Topical Daily   digoxin   0.0625 mg Oral Daily   potassium chloride   40 mEq Oral Once   sodium chloride  flush  3 mL Intravenous Q12H   spironolactone   12.5 mg Oral Daily   Continuous Infusions:  milrinone  0.25 mcg/kg/min (09/09/23 2054)     LOS: 5 days    Time spent:    Sigurd Pac, MD [facility]   09/10/2023, 10:33 AM

## 2023-09-10 NOTE — Evaluation (Signed)
 Physical Therapy Evaluation Patient Details Name: Leslie Williamson MRN: [id number] DOB: 1931-07-10 Today's Date: 09/10/2023  History of Present Illness  Patient is 89 yo female admitted on 09/05/23 for shortness of breath and nausea found to have Acute on chronic heart failure with reduced ejection fraction/chemo induced cardiomyopathy. Recent radiation treatments to R LE. PMH significant for chronic diastolic dysfunction CHF, pulmonary embolism, HTN, small B-cell lymphoma of the spleen/non-Hodgkin's lymphoma, PAD, essential thrombocytosis, multiple myeloma, JAK2 plus myelo proliferative disorder, MGUS and GERD.  Clinical Impression  PTA, patient lives with family in one level home with 3 STE. Patient is an independent household ambulator and denies history of falls in the past year. Patient presents today with resting HR of 122 bpm at rest. Patient denies any discomfort or concerns. Demonstrates generalized weakness, impaired activity tolerance, and decreased functional mobility. Patient completed supine <> sit transfers with modA. Able to tolerate seated EOB for 5 minutes with emphasis on pursed lip breathing techniques. HR peaked at 128 bpm with activity. Patient will continue to benefit from skilled acute PT services to address the above impairments. Anticipate patient will progress to discharge home with 24/7 family supervision/assist and would recommend HHPT services.       If plan is discharge home, recommend the following: A lot of help with bathing/dressing/bathroom;A lot of help with walking and/or transfers;Assistance with cooking/housework;Help with stairs or ramp for entrance   Can travel by private vehicle        Equipment Recommendations Other (comment);BSC/3in1 (Patient has 2WW and reports in process of obtaining [facility].)  Recommendations for Other Services       Functional Status Assessment Patient has had a recent decline in their functional status and demonstrates the ability to  make significant improvements in function in a reasonable and predictable amount of time.     Precautions / Restrictions Precautions Precautions: Fall;Other (comment) Recall of Precautions/Restrictions: Intact Precaution/Restrictions Comments: monitor HR Restrictions Weight Bearing Restrictions Per Provider Order: No      Mobility  Bed Mobility Overal bed mobility: Needs Assistance Bed Mobility: Rolling, Supine to Sit, Sit to Supine Rolling: Min assist, Used rails   Supine to sit: Mod assist Sit to supine: Mod assist   General bed mobility comments: Patient assisted with bed mobility to reduce strain as HR was elevated at rest. Mod A for trunk righting and modA for bringing LE back onto bed.    Transfers                   General transfer comment: sit to stand transfer deferred as HR resting was in 120s bpm.    Ambulation/Gait               General Gait Details: Unable to assess.  Stairs            Wheelchair Mobility     Tilt Bed    Modified Rankin (Stroke Patients Only)       Balance Overall balance assessment: Needs assistance Sitting-balance support: Feet unsupported, No upper extremity supported Sitting balance-Leahy Scale: Fair Sitting balance - Comments: Seated EOB initially with minA but progressed to CGA. Able to tolerate EOB for 5 minutes.       Standing balance comment: Unable to assess                             Pertinent Vitals/Pain Pain Assessment Pain Assessment: No/denies pain    Home Living Family/patient expects  to be discharged to:: Private residence Living Arrangements: Children Available Help at Discharge: Family;Available 24 hours/day Type of Home: House Home Access: Stairs to enter Entrance Stairs-Rails: Doctor, general practice of Steps: 3   Home Layout: One level;Other (Comment) (Reports one step inside home when ambulating to/from bathroom) Home Equipment: Shower seat;Grab bars -  tub/shower;Rolling Walker (2 wheels);Hand held shower head;Cane - single point      Prior Function Prior Level of Function : Independent/Modified Independent;Driving             Mobility Comments: Patient ambulates without AD. She does report driving still but not very often. No falls in the past year. ADLs Comments: Independent.     Extremity/Trunk Assessment        Lower Extremity Assessment Lower Extremity Assessment: Generalized weakness    Cervical / Trunk Assessment Cervical / Trunk Assessment: Kyphotic  Communication   Communication Communication: No apparent difficulties Factors Affecting Communication: Other (comment) (HOH)    Cognition Arousal: Alert Behavior During Therapy: WFL for tasks assessed/performed   PT - Cognitive impairments: No apparent impairments                         Following commands: Intact       Cueing Cueing Techniques: Verbal cues, Tactile cues, Visual cues     General Comments General comments (skin integrity, edema, etc.): Educated patient on importance of upright posture and proper positioning in bed to decrease lung dependency. Also educated patient on pursed lip breathing prior to EOB and while seated EOB to regulate HR. Mod/max cues for completion.    Exercises     Assessment/Plan    PT Assessment Patient needs continued PT services  PT Problem List Decreased strength;Decreased knowledge of use of DME;Decreased safety awareness;Decreased activity tolerance;Decreased balance;Decreased knowledge of precautions;Cardiopulmonary status limiting activity;Decreased mobility       PT Treatment Interventions DME instruction;Gait training;Neuromuscular re-education;Patient/family education;Functional mobility training;Therapeutic activities;Therapeutic exercise;Balance training    PT Goals (Current goals can be found in the Care Plan section)  Acute Rehab PT Goals Patient Stated Goal: Patient's goal is to return  home. PT Goal Formulation: With patient Time For Goal Achievement: 09/24/23 Potential to Achieve Goals: Good    Frequency Min 2X/week     Co-evaluation               AM-PAC PT 6 Clicks Mobility  Outcome Measure Help needed turning from your back to your side while in a flat bed without using bedrails?: A Little Help needed moving from lying on your back to sitting on the side of a flat bed without using bedrails?: A Little Help needed moving to and from a bed to a chair (including a wheelchair)?: A Lot Help needed standing up from a chair using your arms (e.g., wheelchair or bedside chair)?: A Lot Help needed to walk in hospital room?: Total Help needed climbing 3-5 steps with a railing? : Total 6 Click Score: 12    End of Session   Activity Tolerance: Patient tolerated treatment well Patient left: in bed;with family/visitor present;with bed alarm set Nurse Communication: Mobility status;Precautions;Other (comment) (HR at rest and with mobility) PT Visit Diagnosis: Other abnormalities of gait and mobility (R26.89);Muscle weakness (generalized) (M62.81)    Time: 8387-8352 PT Time Calculation (min) (ACUTE ONLY): 35 min   Charges:   PT Evaluation $PT Eval Low Complexity: 1 Low PT Treatments $Therapeutic Activity: 23-37 mins PT General Charges $$ ACUTE PT VISIT: 1 Visit  Sherryle Erath, PT, DPT [facility] Office: [phone]   Sherryle VEAR [place] 09/10/2023, 5:04 PM

## 2023-09-11 DIAGNOSIS — I5023 Acute on chronic systolic (congestive) heart failure: Secondary | ICD-10-CM | POA: Diagnosis not present

## 2023-09-11 LAB — COOXEMETRY PANEL
Carboxyhemoglobin: 0.9 % (ref 0.5–1.5)
Methemoglobin: 1.3 % (ref 0.0–1.5)
O2 Saturation: 56 %
Total hemoglobin: 9.4 g/dL — ABNORMAL LOW (ref 12.0–16.0)

## 2023-09-11 LAB — COMPREHENSIVE METABOLIC PANEL WITH GFR
ALT: 39 U/L (ref 0–44)
AST: 17 U/L (ref 15–41)
Albumin: 2.8 g/dL — ABNORMAL LOW (ref 3.5–5.0)
Alkaline Phosphatase: 90 U/L (ref 38–126)
Anion gap: 10 (ref 5–15)
BUN: 21 mg/dL (ref 8–23)
CO2: 24 mmol/L (ref 22–32)
Calcium: 8.3 mg/dL — ABNORMAL LOW (ref 8.9–10.3)
Chloride: 103 mmol/L (ref 98–111)
Creatinine, Ser: 1.37 mg/dL — ABNORMAL HIGH (ref 0.44–1.00)
GFR, Estimated: 37 mL/min — ABNORMAL LOW (ref >60)
Glucose, Bld: 111 mg/dL — ABNORMAL HIGH (ref 70–99)
Potassium: 3.6 mmol/L (ref 3.5–5.1)
Sodium: 137 mmol/L (ref 135–145)
Total Bilirubin: 0.8 mg/dL (ref 0.0–1.2)
Total Protein: 4.4 g/dL — ABNORMAL LOW (ref 6.5–8.1)

## 2023-09-11 LAB — CBC
HCT: 25 % — ABNORMAL LOW (ref 36.0–46.0)
Hemoglobin: 8 g/dL — ABNORMAL LOW (ref 12.0–15.0)
MCH: 23.1 pg — ABNORMAL LOW (ref 26.0–34.0)
MCHC: 32 g/dL (ref 30.0–36.0)
MCV: 72.3 fL — ABNORMAL LOW (ref 80.0–100.0)
Platelets: 330 K/uL (ref 150–400)
RBC: 3.46 MIL/uL — ABNORMAL LOW (ref 3.87–5.11)
RDW: 19.3 % — ABNORMAL HIGH (ref 11.5–15.5)
WBC: 5.8 K/uL (ref 4.0–10.5)
nRBC: 3.3 % — ABNORMAL HIGH (ref 0.0–0.2)

## 2023-09-11 LAB — DIGOXIN LEVEL: Digoxin Level: <0.4 ng/mL — ABNORMAL LOW (ref 0.8–2.0)

## 2023-09-11 LAB — MAGNESIUM: Magnesium: 2.3 mg/dL (ref 1.7–2.4)

## 2023-09-11 MED ORDER — FUROSEMIDE 10 MG/ML IJ SOLN
80.0000 mg | Freq: Once | INTRAMUSCULAR | Status: AC
  Administered 2023-09-11: 80 mg via INTRAVENOUS
  Filled 2023-09-11: qty 8

## 2023-09-11 NOTE — Progress Notes (Signed)
 Patient states her left side hip area has been hurting off and on since they pulled her over onto xray table on 09/10/23.

## 2023-09-11 NOTE — Progress Notes (Signed)
 PROGRESS NOTE    Leslie Williamson  FMW:[id number] DOB: [birth year] DOA: 09/05/2023 PCP: Carlette Benita Area, MD  89/F w chronic diastolic dysfunction CHF, pulmonary embolism, HTN, small B-cell lymphoma of the spleen/non-Hodgkin's lymphoma, PAD, essential thrombocytosis, JAK2 plus myelo proliferative disorder, MGUS and GERD Presented to [facility] ED with complaints of chest discomfort increasing shortness of breath and nausea for the last couple days. - Workup in the ED noted bilateral pleural effusions, BNP more than 4500, lactic acidosis, elevated creatinine - Echo noted EF of 20%, normal RV - Seen by palliative care, cardiology, started on IV milrinone  and transferred to [facility], arrived after midnight 7/18  Subjective: - Feels better overall, breathing improving  Assessment and Plan:  Acute systolic CHF Low output failure -2D echo noted EF down to 20%, normal RV, suspected to be secondary to Adriamycin  -Complicated by lactic acidosis, AKI, after discussion with oncology and palliative care she was started on IV milrinone  and transferred to [facility] - Volume status improving, conservative management - CHF team following, started on digoxin , Aldactone , mains tachycardic on milrinone  with soft blood pressures, not a candidate for advanced therapies, palliative care following - Cardiac MRI with low RV and LVEF -Repeat Lasix  today -Cards following, ? Wean milrinone --co-ox pending this am, will reorder - Increase activity, PT consult   AKI -- cardiorenal, stable   Transaminitis -- Second to passive hepatic congestion from CHF, improving   H/o PE -- remains on apixaban     Multiple Myeloma  Non-Hodgkins Lymphoma  -- she is followed by Dr. Rogers -- completed 6 cycles of R mini CHOP -- Attending at [facility] Dr.Johnson discussed with oncologist Dr MARLA who knows patient well, he felt  her cancers are well controlled now, myeloma has been quiescent and being followed by him, says she likely  developed heart failure from adriamycin  therapy which is now completed and now to complete remainder of lymphoma treatment with radiation therapy to the leg -- he recommended continue aggressive medical management of heart failure   Chronic anemia -Likely multifactorial, overall improving, monitor   DVT prophylaxis: apixaban   Code Status: DNR  Communication: Granddaughter at bedside Disposition Plan: Home likely next week  Consultants: CHF team   Procedures:   Antimicrobials:    Objective: Vitals:   09/11/23 0400 09/11/23 0432 09/11/23 0500 09/11/23 0721  BP:  93/62  104/69  Pulse:  (!) 115  (!) 120  Resp: 19 15  20   Temp:  98.3 F (36.8 C)  98.2 F (36.8 C)  TempSrc:  Oral  Oral  SpO2:  91%  95%  Weight:   53.1 kg   Height:        Intake/Output Summary (Last 24 hours) at 09/11/2023 1017 Last data filed at 09/10/2023 2210 Gross per 24 hour  Intake 240 ml  Output --  Net 240 ml   Filed Weights   09/09/23 0000 09/10/23 0607 09/11/23 0500  Weight: 54 kg 53.1 kg 53.1 kg    Examination:  General exam: Clearly elderly, AAO x 3 HEENT: Positive JVD, Port-A-Cath CVS: S1-S2, regular rhythm Lungs: Decreased breath sounds at the bases Abdomen: Soft, nontender, bowel sounds present Remedies: Trace edema Skin: No rashes Psychiatry: Flat affect    Data Reviewed:   CBC: Recent Labs  Lab 09/05/23 1345 09/06/23 0507 09/07/23 0420 09/08/23 0405 09/09/23 0430 09/10/23 0500 09/11/23 0200  WBC 5.6   < > 5.1 5.0 6.8 5.1 5.8  NEUTROABS 3.3  --   --   --   --   --   --  HGB 9.0*   < > 8.6* 8.0* 9.3* 8.7* 8.0*  HCT 28.3*   < > 26.7* 25.6* 29.2* 27.3* 25.0*  MCV 74.9*   < > 73.0* 73.8* 72.6* 72.8* 72.3*  PLT 363   < > 343 331 373 371 330   < > = values in this interval not displayed.   Basic Metabolic Panel: Recent Labs  Lab 09/05/23 1410 09/06/23 0507 09/07/23 0420 09/08/23 0405 09/09/23 0430 09/09/23 0730 09/10/23 0500 09/11/23 0200  NA  --  143   < >  143 141 138 143 137  K  --  3.7   < > 3.9 3.8 3.8 3.4* 3.6  CL  --  113*   < > 113* 107 104 106 103  CO2  --  20*   < > 22 24 22 26 24   GLUCOSE  --  91   < > 101* 127* 125* 121* 111*  BUN  --  55*   < > 32* 24* 24* 19 21  CREATININE  --  1.73*   < > 1.27* 1.15* 1.28* 1.25* 1.37*  CALCIUM   --  8.3*   < > 8.3* 8.9 8.9 8.6* 8.3*  MG 2.1  --   --  1.7 1.7  --  1.7 2.3  PHOS  --  4.5  --   --   --   --   --   --    < > = values in this interval not displayed.   GFR: Estimated Creatinine Clearance: 19.6 mL/min (A) (by C-G formula based on SCr of 1.37 mg/dL (H)). Liver Function Tests: Recent Labs  Lab 09/08/23 0405 09/09/23 0430 09/09/23 0730 09/10/23 0500 09/11/23 0200  AST 31 26 27 19 17   ALT 85* 74* 73* 51* 39  ALKPHOS 120 110 112 101 90  BILITOT 0.9 1.1 0.9 1.0 0.8  PROT 4.4* 5.0* 5.1* 4.7* 4.4*  ALBUMIN 2.7* 3.1* 3.1* 2.9* 2.8*   No results for input(s): LIPASE, AMYLASE in the last 168 hours. No results for input(s): AMMONIA in the last 168 hours. Coagulation Profile: No results for input(s): INR, PROTIME in the last 168 hours. Cardiac Enzymes: No results for input(s): CKTOTAL, CKMB, CKMBINDEX, TROPONINI in the last 168 hours. BNP (last 3 results) No results for input(s): PROBNP in the last 8760 hours. HbA1C: No results for input(s): HGBA1C in the last 72 hours. CBG: No results for input(s): GLUCAP in the last 168 hours. Lipid Profile: No results for input(s): CHOL, HDL, LDLCALC, TRIG, CHOLHDL, LDLDIRECT in the last 72 hours. Thyroid Function Tests: No results for input(s): TSH, T4TOTAL, FREET4, T3FREE, THYROIDAB in the last 72 hours. Anemia Panel: No results for input(s): VITAMINB12, FOLATE, FERRITIN, TIBC, IRON , RETICCTPCT in the last 72 hours. Urine analysis:    Component Value Date/Time   COLORURINE YELLOW 09/06/2023 1400   APPEARANCEUR CLEAR 09/06/2023 1400   LABSPEC 1.010 09/06/2023 1400   PHURINE 5.0  09/06/2023 1400   GLUCOSEU 150 (A) 09/06/2023 1400   HGBUR NEGATIVE 09/06/2023 1400   HGBUR large 06/19/2007 0845   BILIRUBINUR NEGATIVE 09/06/2023 1400   BILIRUBINUR negative 08/05/2021 1334   KETONESUR NEGATIVE 09/06/2023 1400   PROTEINUR NEGATIVE 09/06/2023 1400   UROBILINOGEN 0.2 08/05/2021 1334   UROBILINOGEN 0.2 07/03/2012 1929   NITRITE NEGATIVE 09/06/2023 1400   LEUKOCYTESUR NEGATIVE 09/06/2023 1400   Sepsis Labs: @LABRCNTIP (procalcitonin:4,lacticidven:4)  ) Recent Results (from the past 240 hours)  Resp panel by RT-PCR (RSV, Flu A&B, Covid) Anterior Nasal Swab  Status: None   Collection Time: 09/05/23  4:33 PM   Specimen: Anterior Nasal Swab  Result Value Ref Range Status   SARS Coronavirus 2 by RT PCR NEGATIVE NEGATIVE Final    Comment: (NOTE) SARS-CoV-2 target nucleic acids are NOT DETECTED.  The SARS-CoV-2 RNA is generally detectable in upper respiratory specimens during the acute phase of infection. The lowest concentration of SARS-CoV-2 viral copies this assay can detect is 138 copies/mL. A negative result does not preclude SARS-Cov-2 infection and should not be used as the sole basis for treatment or other patient management decisions. A negative result may occur with  improper specimen collection/handling, submission of specimen other than nasopharyngeal swab, presence of viral mutation(s) within the areas targeted by this assay, and inadequate number of viral copies(<138 copies/mL). A negative result must be combined with clinical observations, patient history, and epidemiological information. The expected result is Negative.  Fact Sheet for Patients:  BloggerCourse.com  Fact Sheet for Healthcare Providers:  SeriousBroker.it  This test is no t yet approved or cleared by the [place]  FDA and  has been authorized for detection and/or diagnosis of SARS-CoV-2 by FDA under an Emergency Use  Authorization (EUA). This EUA will remain  in effect (meaning this test can be used) for the duration of the COVID-19 declaration under Section 564(b)(1) of the Act, 21 U.S.C.section 360bbb-3(b)(1), unless the authorization is terminated  or revoked sooner.       Influenza A by PCR NEGATIVE NEGATIVE Final   Influenza B by PCR NEGATIVE NEGATIVE Final    Comment: (NOTE) The Xpert Xpress SARS-CoV-2/FLU/RSV plus assay is intended as an aid in the diagnosis of influenza from Nasopharyngeal swab specimens and should not be used as a sole basis for treatment. Nasal washings and aspirates are unacceptable for Xpert Xpress SARS-CoV-2/FLU/RSV testing.  Fact Sheet for Patients: BloggerCourse.com  Fact Sheet for Healthcare Providers: SeriousBroker.it  This test is not yet approved or cleared by the [place]  FDA and has been authorized for detection and/or diagnosis of SARS-CoV-2 by FDA under an Emergency Use Authorization (EUA). This EUA will remain in effect (meaning this test can be used) for the duration of the COVID-19 declaration under Section 564(b)(1) of the Act, 21 U.S.C. section 360bbb-3(b)(1), unless the authorization is terminated or revoked.     Resp Syncytial Virus by PCR NEGATIVE NEGATIVE Final    Comment: (NOTE) Fact Sheet for Patients: BloggerCourse.com  Fact Sheet for Healthcare Providers: SeriousBroker.it  This test is not yet approved or cleared by the [place]  FDA and has been authorized for detection and/or diagnosis of SARS-CoV-2 by FDA under an Emergency Use Authorization (EUA). This EUA will remain in effect (meaning this test can be used) for the duration of the COVID-19 declaration under Section 564(b)(1) of the Act, 21 U.S.C. section 360bbb-3(b)(1), unless the authorization is terminated or revoked.  Performed at [facility], [address],  [place], [place] [address]   MRSA Next Gen by PCR, Nasal     Status: None   Collection Time: 09/06/23  5:45 PM   Specimen: Nasal Mucosa; Nasal Swab  Result Value Ref Range Status   MRSA by PCR Next Gen NOT DETECTED NOT DETECTED Final    Comment: (NOTE) The GeneXpert MRSA Assay (FDA approved for NASAL specimens only), is one component of a comprehensive MRSA colonization surveillance program. It is not intended to diagnose MRSA infection nor to guide or monitor treatment for MRSA infections. Test performance is not FDA approved in patients less  than 47 years old. Performed at [facility], [address], [place], [place] [address]      Radiology Studies: MR CARDIAC MORPHOLOGY W WO CONTRAST Result Date: 09/09/2023 CLINICAL DATA:  30F with multiple myeloma, lymphoma, PE, CKD p/w cardiogenic shock. Echo with EF 20%, normal RV function EXAM: CARDIAC MRI TECHNIQUE: The patient was scanned on a 1.5 Tesla Siemens magnet. A dedicated cardiac coil was used. Functional imaging was done using Fiesta sequences. 2,3, and 4 chamber views were done to assess for RWMA's. Modified Simpson's rule using a short axis stack was used to calculate an ejection fraction on a dedicated work [employer], [employer]. The patient received 10 cc of Gadavist . After 10 minutes inversion recovery sequences were used to assess for infiltration and scar tissue. Phase contrast velocity mapping was performed above the aortic and pulmonic valves CONTRAST:  10 cc  of Gadavist  FINDINGS: Left ventricle: -Mild dilatation -Severe systolic dysfunction -Elevated ECV (38%) -RV insertion site LGE LV EF:  22% (Normal 52-79%) Absolute volumes: LV EDV: (Normal 78-167 mL) LV ESV: (Normal 21-64 mL) LV SV: 37mL (Normal 52-114 mL) CO: 4.5L/min (Normal 2.7-6.3 L/min) Indexed volumes: LV EDV: 128mL/sq-m (Normal 50-96 mL/sq-m) LV ESV: 52mL/sq-m (Normal 10-40 mL/sq-m) LV SV: 80mL/sq-m (Normal 33-64 mL/sq-m) CI: 3.0L/min/sq-m (Normal 1.9-3.9  L/min/sq-m) Right ventricle: Mild dilatation with severe systolic dysfunction RV EF: 28% (Normal 52-80%) Absolute volumes: RV EDV: (Normal 79-175 mL) RV ESV: (Normal 13-75 mL) RV SV: 48mL (Normal 56-110 mL) CO: 5.8L/min (Normal 2.7-6 L/min) Indexed volumes: RV EDV: 141mL/sq-m (Normal 51-97 mL/sq-m) RV ESV: 11mL/sq-m (Normal 9-42 mL/sq-m) RV SV: 25mL/sq-m (Normal 35-61 mL/sq-m) CI: 3.9L/min/sq-m (Normal 1.8-3.8 L/min/sq-m) Left atrium: Moderate enlargement Right atrium: Mild enlargement Mitral valve: Moderate regurgitation (regurgitant fraction 35%) Aortic valve: Mild regurgitation Tricuspid valve: Moderate regurgitation Pulmonic valve: Trivial regurgitation Aorta: Normal proximal ascending aorta Pericardium: Small effusion IMPRESSION: 1.  Mild LV dilatation with severe systolic dysfunction (EF 22%) 2.  Mild RV dilatation with severe systolic dysfunction (EF 28%) 3. RV insertion site LGE, which is a nonspecific scar pattern often seen in setting of elevated pulmonary pressures 4.  Moderate mitral regurgitation (regurgitant fraction 35%) Electronically Signed   By: Lonni Nanas M.D.   On: 09/09/2023 18:24   MR CARDIAC VELOCITY FLOW MAP Result Date: 09/09/2023 CLINICAL DATA:  30F with multiple myeloma, lymphoma, PE, CKD p/w cardiogenic shock. Echo with EF 20%, normal RV function EXAM: CARDIAC MRI TECHNIQUE: The patient was scanned on a 1.5 Tesla Siemens magnet. A dedicated cardiac coil was used. Functional imaging was done using Fiesta sequences. 2,3, and 4 chamber views were done to assess for RWMA's. Modified Simpson's rule using a short axis stack was used to calculate an ejection fraction on a dedicated work [employer] officer, [employer]. The patient received 10 cc of Gadavist . After 10 minutes inversion recovery sequences were used to assess for infiltration and scar tissue. Phase contrast velocity mapping was performed above the aortic and pulmonic valves CONTRAST:  10 cc  of Gadavist   FINDINGS: Left ventricle: -Mild dilatation -Severe systolic dysfunction -Elevated ECV (38%) -RV insertion site LGE LV EF:  22% (Normal 52-79%) Absolute volumes: LV EDV: (Normal 78-167 mL) LV ESV: (Normal 21-64 mL) LV SV: 37mL (Normal 52-114 mL) CO: 4.5L/min (Normal 2.7-6.3 L/min) Indexed volumes: LV EDV: 171mL/sq-m (Normal 50-96 mL/sq-m) LV ESV: 89mL/sq-m (Normal 10-40 mL/sq-m) LV SV: 65mL/sq-m (Normal 33-64 mL/sq-m) CI: 3.0L/min/sq-m (Normal 1.9-3.9 L/min/sq-m) Right ventricle: Mild dilatation with severe systolic dysfunction RV  EF: 28% (Normal 52-80%) Absolute volumes: RV EDV: (Normal 79-175 mL) RV ESV: (Normal 13-75 mL) RV SV: 48mL (Normal 56-110 mL) CO: 5.8L/min (Normal 2.7-6 L/min) Indexed volumes: RV EDV: 166mL/sq-m (Normal 51-97 mL/sq-m) RV ESV: 72mL/sq-m (Normal 9-42 mL/sq-m) RV SV: 28mL/sq-m (Normal 35-61 mL/sq-m) CI: 3.9L/min/sq-m (Normal 1.8-3.8 L/min/sq-m) Left atrium: Moderate enlargement Right atrium: Mild enlargement Mitral valve: Moderate regurgitation (regurgitant fraction 35%) Aortic valve: Mild regurgitation Tricuspid valve: Moderate regurgitation Pulmonic valve: Trivial regurgitation Aorta: Normal proximal ascending aorta Pericardium: Small effusion IMPRESSION: 1.  Mild LV dilatation with severe systolic dysfunction (EF 22%) 2.  Mild RV dilatation with severe systolic dysfunction (EF 28%) 3. RV insertion site LGE, which is a nonspecific scar pattern often seen in setting of elevated pulmonary pressures 4.  Moderate mitral regurgitation (regurgitant fraction 35%) Electronically Signed   By: Lonni Nanas M.D.   On: 09/09/2023 18:24   MR CARDIAC VELOCITY FLOW MAP Result Date: 09/09/2023 CLINICAL DATA:  5F with multiple myeloma, lymphoma, PE, CKD p/w cardiogenic shock. Echo with EF 20%, normal RV function EXAM: CARDIAC MRI TECHNIQUE: The patient was scanned on a 1.5 Tesla Siemens magnet. A dedicated cardiac coil was used. Functional imaging was done using Fiesta  sequences. 2,3, and 4 chamber views were done to assess for RWMA's. Modified Simpson's rule using a short axis stack was used to calculate an ejection fraction on a dedicated work [employer] officer, [employer]. The patient received 10 cc of Gadavist . After 10 minutes inversion recovery sequences were used to assess for infiltration and scar tissue. Phase contrast velocity mapping was performed above the aortic and pulmonic valves CONTRAST:  10 cc  of Gadavist  FINDINGS: Left ventricle: -Mild dilatation -Severe systolic dysfunction -Elevated ECV (38%) -RV insertion site LGE LV EF:  22% (Normal 52-79%) Absolute volumes: LV EDV: (Normal 78-167 mL) LV ESV: (Normal 21-64 mL) LV SV: 37mL (Normal 52-114 mL) CO: 4.5L/min (Normal 2.7-6.3 L/min) Indexed volumes: LV EDV: 13mL/sq-m (Normal 50-96 mL/sq-m) LV ESV: 41mL/sq-m (Normal 10-40 mL/sq-m) LV SV: 53mL/sq-m (Normal 33-64 mL/sq-m) CI: 3.0L/min/sq-m (Normal 1.9-3.9 L/min/sq-m) Right ventricle: Mild dilatation with severe systolic dysfunction RV EF: 28% (Normal 52-80%) Absolute volumes: RV EDV: (Normal 79-175 mL) RV ESV: (Normal 13-75 mL) RV SV: 48mL (Normal 56-110 mL) CO: 5.8L/min (Normal 2.7-6 L/min) Indexed volumes: RV EDV: 17mL/sq-m (Normal 51-97 mL/sq-m) RV ESV: 2mL/sq-m (Normal 9-42 mL/sq-m) RV SV: 66mL/sq-m (Normal 35-61 mL/sq-m) CI: 3.9L/min/sq-m (Normal 1.8-3.8 L/min/sq-m) Left atrium: Moderate enlargement Right atrium: Mild enlargement Mitral valve: Moderate regurgitation (regurgitant fraction 35%) Aortic valve: Mild regurgitation Tricuspid valve: Moderate regurgitation Pulmonic valve: Trivial regurgitation Aorta: Normal proximal ascending aorta Pericardium: Small effusion IMPRESSION: 1.  Mild LV dilatation with severe systolic dysfunction (EF 22%) 2.  Mild RV dilatation with severe systolic dysfunction (EF 28%) 3. RV insertion site LGE, which is a nonspecific scar pattern often seen in setting of elevated pulmonary pressures 4.  Moderate  mitral regurgitation (regurgitant fraction 35%) Electronically Signed   By: Lonni Nanas M.D.   On: 09/09/2023 18:24     Scheduled Meds:  acyclovir   400 mg Oral BID   allopurinol   300 mg Oral Daily   apixaban   5 mg Oral BID   Chlorhexidine  Gluconate Cloth  6 each Topical Daily   digoxin   0.0625 mg Oral Daily   furosemide   80 mg Intravenous Once   potassium chloride   40 mEq Oral Once   sodium chloride  flush  3 mL Intravenous Q12H   spironolactone   12.5 mg  Oral Daily   Continuous Infusions:  milrinone  0.25 mcg/kg/min (09/10/23 2210)     LOS: 6 days    Time spent:    Sigurd Pac, MD [facility]   09/11/2023, 10:17 AM

## 2023-09-11 NOTE — Progress Notes (Signed)
 Progress Note  Patient Name: Leslie Williamson Date of Encounter: 09/11/2023  Primary Cardiologist: None   Subjective   Patient seen and examined at her bedside her family was by her bedside when I arrived.  She was awake when I arrived, her granddaughter Leslie Williamson was by the bedside.  Inpatient Medications    Scheduled Meds:  acyclovir   400 mg Oral BID   allopurinol   300 mg Oral Daily   apixaban   5 mg Oral BID   Chlorhexidine  Gluconate Cloth  6 each Topical Daily   digoxin   0.0625 mg Oral Daily   furosemide   80 mg Intravenous Once   potassium chloride   40 mEq Oral Once   sodium chloride  flush  3 mL Intravenous Q12H   spironolactone   12.5 mg Oral Daily   Continuous Infusions:  milrinone  0.25 mcg/kg/min (09/10/23 2210)   PRN Meds: acetaminophen  **OR** acetaminophen , bisacodyl , guaiFENesin -dextromethorphan , ondansetron  **OR** ondansetron  (ZOFRAN ) IV, polyethylene glycol, sodium chloride  flush, traZODone    Vital Signs    Vitals:   09/11/23 0400 09/11/23 0432 09/11/23 0500 09/11/23 0721  BP:  93/62  104/69  Pulse:  (!) 115  (!) 120  Resp: 19 15  20   Temp:  98.3 F (36.8 C)  98.2 F (36.8 C)  TempSrc:  Oral  Oral  SpO2:  91%  95%  Weight:   53.1 kg   Height:        Intake/Output Summary (Last 24 hours) at 09/11/2023 0926 Last data filed at 09/10/2023 2210 Gross per 24 hour  Intake 240 ml  Output --  Net 240 ml   Filed Weights   09/09/23 0000 09/10/23 0607 09/11/23 0500  Weight: 54 kg 53.1 kg 53.1 kg    Telemetry    Sinus tachycardia - Personally Reviewed  ECG     - Personally Reviewed  Physical Exam   General: Comfortable Head: Atraumatic, normal size  Eyes: PEERLA, EOMI  Neck: No JVD, she does have a right chest Port-A-Cath Cardiac: Normal S1, S2; RRR; no murmurs, rubs, or gallops Lungs: Clear to auscultation bilaterally Abd: Soft, nontender, no hepatomegaly  Ext: warm, trace lower extremity edema  Labs    Chemistry Recent Labs  Lab  09/09/23 0730 09/10/23 0500 09/11/23 0200  NA 138 143 137  K 3.8 3.4* 3.6  CL 104 106 103  CO2 22 26 24   GLUCOSE 125* 121* 111*  BUN 24* 19 21  CREATININE 1.28* 1.25* 1.37*  CALCIUM  8.9 8.6* 8.3*  PROT 5.1* 4.7* 4.4*  ALBUMIN 3.1* 2.9* 2.8*  AST 27 19 17   ALT 73* 51* 39  ALKPHOS 112 101 90  BILITOT 0.9 1.0 0.8  GFRNONAA 40* 41* 37*  ANIONGAP 12 11 10      Hematology Recent Labs  Lab 09/09/23 0430 09/10/23 0500 09/11/23 0200  WBC 6.8 5.1 5.8  RBC 4.02 3.75* 3.46*  HGB 9.3* 8.7* 8.0*  HCT 29.2* 27.3* 25.0*  MCV 72.6* 72.8* 72.3*  MCH 23.1* 23.2* 23.1*  MCHC 31.8 31.9 32.0  RDW 19.4* 19.7* 19.3*  PLT 373 371 330    Cardiac EnzymesNo results for input(s): TROPONINI in the last 168 hours. No results for input(s): TROPIPOC in the last 168 hours.   BNP Recent Labs  Lab 09/05/23 1410 09/07/23 0420  BNP >4,500.0* >4,500.0*     DDimer No results for input(s): DDIMER in the last 168 hours.   Radiology    MR CARDIAC MORPHOLOGY W WO CONTRAST Result Date: 09/09/2023 CLINICAL DATA:  21F with multiple myeloma,  lymphoma, PE, CKD p/w cardiogenic shock. Echo with EF 20%, normal RV function EXAM: CARDIAC MRI TECHNIQUE: The patient was scanned on a 1.5 Tesla Siemens magnet. A dedicated cardiac coil was used. Functional imaging was done using Fiesta sequences. 2,3, and 4 chamber views were done to assess for RWMA's. Modified Simpson's rule using a short axis stack was used to calculate an ejection fraction on a dedicated work Research officer, [employer]. The patient received 10 cc of Gadavist . After 10 minutes inversion recovery sequences were used to assess for infiltration and scar tissue. Phase contrast velocity mapping was performed above the aortic and pulmonic valves CONTRAST:  10 cc  of Gadavist  FINDINGS: Left ventricle: -Mild dilatation -Severe systolic dysfunction -Elevated ECV (38%) -RV insertion site LGE LV EF:  22% (Normal 52-79%) Absolute volumes: LV EDV:  (Normal 78-167 mL) LV ESV: (Normal 21-64 mL) LV SV: 37mL (Normal 52-114 mL) CO: 4.5L/min (Normal 2.7-6.3 L/min) Indexed volumes: LV EDV: 145mL/sq-m (Normal 50-96 mL/sq-m) LV ESV: 72mL/sq-m (Normal 10-40 mL/sq-m) LV SV: 45mL/sq-m (Normal 33-64 mL/sq-m) CI: 3.0L/min/sq-m (Normal 1.9-3.9 L/min/sq-m) Right ventricle: Mild dilatation with severe systolic dysfunction RV EF: 28% (Normal 52-80%) Absolute volumes: RV EDV: (Normal 79-175 mL) RV ESV: (Normal 13-75 mL) RV SV: 48mL (Normal 56-110 mL) CO: 5.8L/min (Normal 2.7-6 L/min) Indexed volumes: RV EDV: 182mL/sq-m (Normal 51-97 mL/sq-m) RV ESV: 52mL/sq-m (Normal 9-42 mL/sq-m) RV SV: 23mL/sq-m (Normal 35-61 mL/sq-m) CI: 3.9L/min/sq-m (Normal 1.8-3.8 L/min/sq-m) Left atrium: Moderate enlargement Right atrium: Mild enlargement Mitral valve: Moderate regurgitation (regurgitant fraction 35%) Aortic valve: Mild regurgitation Tricuspid valve: Moderate regurgitation Pulmonic valve: Trivial regurgitation Aorta: Normal proximal ascending aorta Pericardium: Small effusion IMPRESSION: 1.  Mild LV dilatation with severe systolic dysfunction (EF 22%) 2.  Mild RV dilatation with severe systolic dysfunction (EF 28%) 3. RV insertion site LGE, which is a nonspecific scar pattern often seen in setting of elevated pulmonary pressures 4.  Moderate mitral regurgitation (regurgitant fraction 35%) Electronically Signed   By: Lonni Nanas M.D.   On: 09/09/2023 18:24   MR CARDIAC VELOCITY FLOW MAP Result Date: 09/09/2023 CLINICAL DATA:  19F with multiple myeloma, lymphoma, PE, CKD p/w cardiogenic shock. Echo with EF 20%, normal RV function EXAM: CARDIAC MRI TECHNIQUE: The patient was scanned on a 1.5 Tesla Siemens magnet. A dedicated cardiac coil was used. Functional imaging was done using Fiesta sequences. 2,3, and 4 chamber views were done to assess for RWMA's. Modified Simpson's rule using a short axis stack was used to calculate an ejection fraction on a dedicated  work Research officer, trade union. The patient received 10 cc of Gadavist . After 10 minutes inversion recovery sequences were used to assess for infiltration and scar tissue. Phase contrast velocity mapping was performed above the aortic and pulmonic valves CONTRAST:  10 cc  of Gadavist  FINDINGS: Left ventricle: -Mild dilatation -Severe systolic dysfunction -Elevated ECV (38%) -RV insertion site LGE LV EF:  22% (Normal 52-79%) Absolute volumes: LV EDV: (Normal 78-167 mL) LV ESV: (Normal 21-64 mL) LV SV: 37mL (Normal 52-114 mL) CO: 4.5L/min (Normal 2.7-6.3 L/min) Indexed volumes: LV EDV: 153mL/sq-m (Normal 50-96 mL/sq-m) LV ESV: 62mL/sq-m (Normal 10-40 mL/sq-m) LV SV: 73mL/sq-m (Normal 33-64 mL/sq-m) CI: 3.0L/min/sq-m (Normal 1.9-3.9 L/min/sq-m) Right ventricle: Mild dilatation with severe systolic dysfunction RV EF: 28% (Normal 52-80%) Absolute volumes: RV EDV: (Normal 79-175 mL) RV ESV: (Normal 13-75 mL) RV SV: 48mL (Normal 56-110 mL) CO: 5.8L/min (Normal 2.7-6 L/min) Indexed volumes: RV EDV: 166mL/sq-m (Normal 51-97 mL/sq-m) RV  ESV: 69mL/sq-m (Normal 9-42 mL/sq-m) RV SV: 32mL/sq-m (Normal 35-61 mL/sq-m) CI: 3.9L/min/sq-m (Normal 1.8-3.8 L/min/sq-m) Left atrium: Moderate enlargement Right atrium: Mild enlargement Mitral valve: Moderate regurgitation (regurgitant fraction 35%) Aortic valve: Mild regurgitation Tricuspid valve: Moderate regurgitation Pulmonic valve: Trivial regurgitation Aorta: Normal proximal ascending aorta Pericardium: Small effusion IMPRESSION: 1.  Mild LV dilatation with severe systolic dysfunction (EF 22%) 2.  Mild RV dilatation with severe systolic dysfunction (EF 28%) 3. RV insertion site LGE, which is a nonspecific scar pattern often seen in setting of elevated pulmonary pressures 4.  Moderate mitral regurgitation (regurgitant fraction 35%) Electronically Signed   By: Lonni Nanas M.D.   On: 09/09/2023 18:24   MR CARDIAC VELOCITY FLOW MAP Result Date:  09/09/2023 CLINICAL DATA:  32F with multiple myeloma, lymphoma, PE, CKD p/w cardiogenic shock. Echo with EF 20%, normal RV function EXAM: CARDIAC MRI TECHNIQUE: The patient was scanned on a 1.5 Tesla Siemens magnet. A dedicated cardiac coil was used. Functional imaging was done using Fiesta sequences. 2,3, and 4 chamber views were done to assess for RWMA's. Modified Simpson's rule using a short axis stack was used to calculate an ejection fraction on a dedicated work Research officer, trade union. The patient received 10 cc of Gadavist . After 10 minutes inversion recovery sequences were used to assess for infiltration and scar tissue. Phase contrast velocity mapping was performed above the aortic and pulmonic valves CONTRAST:  10 cc  of Gadavist  FINDINGS: Left ventricle: -Mild dilatation -Severe systolic dysfunction -Elevated ECV (38%) -RV insertion site LGE LV EF:  22% (Normal 52-79%) Absolute volumes: LV EDV: (Normal 78-167 mL) LV ESV: (Normal 21-64 mL) LV SV: 37mL (Normal 52-114 mL) CO: 4.5L/min (Normal 2.7-6.3 L/min) Indexed volumes: LV EDV: 173mL/sq-m (Normal 50-96 mL/sq-m) LV ESV: 76mL/sq-m (Normal 10-40 mL/sq-m) LV SV: 3mL/sq-m (Normal 33-64 mL/sq-m) CI: 3.0L/min/sq-m (Normal 1.9-3.9 L/min/sq-m) Right ventricle: Mild dilatation with severe systolic dysfunction RV EF: 28% (Normal 52-80%) Absolute volumes: RV EDV: (Normal 79-175 mL) RV ESV: (Normal 13-75 mL) RV SV: 48mL (Normal 56-110 mL) CO: 5.8L/min (Normal 2.7-6 L/min) Indexed volumes: RV EDV: 157mL/sq-m (Normal 51-97 mL/sq-m) RV ESV: 87mL/sq-m (Normal 9-42 mL/sq-m) RV SV: 45mL/sq-m (Normal 35-61 mL/sq-m) CI: 3.9L/min/sq-m (Normal 1.8-3.8 L/min/sq-m) Left atrium: Moderate enlargement Right atrium: Mild enlargement Mitral valve: Moderate regurgitation (regurgitant fraction 35%) Aortic valve: Mild regurgitation Tricuspid valve: Moderate regurgitation Pulmonic valve: Trivial regurgitation Aorta: Normal proximal ascending aorta  Pericardium: Small effusion IMPRESSION: 1.  Mild LV dilatation with severe systolic dysfunction (EF 22%) 2.  Mild RV dilatation with severe systolic dysfunction (EF 28%) 3. RV insertion site LGE, which is a nonspecific scar pattern often seen in setting of elevated pulmonary pressures 4.  Moderate mitral regurgitation (regurgitant fraction 35%) Electronically Signed   By: Lonni Nanas M.D.   On: 09/09/2023 18:24    Cardiac Studies   Echo and Cardiac MRI  Patient Profile     89 y.o. female   Assessment & Plan    Acute on chronic heart failure with reduced ejection fraction/chemo induced cardiomyopathy (Adriamycin ) Acute renal injury/cardiorenal Transaminitis History of multiple myeloma/non-Hodgkin lymphoma  She is still on milrinone . Coox yesterday was 53%, today's pending.  If repeat coox is acceptable will  plan to wean the milrinone  today. She is still tachycardic in the setting of her milrinone  use.  Blood pressure marginal but has improved compared to yesterday.  Agree with the 1 dose of Lasix .  Continue her Aldactone  and digoxin .  Unfortunately unable to introduce any further  GDMT due to blood pressure.     Her echocardiogram as well as the cardiac MRI has shown severe ventricular depressed ejection fraction.  Previous discussion with patient's family for prognosis has been done with by our advanced heart failure team.   Continue to monitor cr  Agree with palliative care on board.  As she does have poor prognosis.      For questions or updates, please contact [facility] Please consult [email or web address] for contact info under Cardiology/STEMI.      Signed, Krystall Kruckenberg, DO  09/11/2023, 9:26 AM

## 2023-09-11 NOTE — Evaluation (Signed)
 Occupational Therapy Evaluation Patient Details Name: Leslie Williamson MRN: [id number] DOB: [birth year] Today's Date: 09/11/2023   History of Present Illness   Patient is 89 yo female admitted on 09/05/23 for shortness of breath and nausea found to have Acute on chronic heart failure with reduced ejection fraction/chemo induced cardiomyopathy. Recent radiation treatments to R LE. PMH significant for chronic diastolic dysfunction CHF, pulmonary embolism, HTN, small B-cell lymphoma of the spleen/non-Hodgkin's lymphoma, PAD, essential thrombocytosis, multiple myeloma, JAK2 plus myelo proliferative disorder, MGUS and GERD.     Clinical Impressions PTA, pt lived with son and reports being independent in ADL; grand daughter in room confirms that pt's family assists with IADL tasks. Upon eval, pt with HR elevated to 110 at rest and up to 135 with standing pericare/toileting. Pt needing CGA-supervision for LB ADL, grooming, and toileting this session. Pt noted with strong/foul smelling urine during session and RN notified. Suspect pt close to baseline. Will continue to follow acutely, but do not anticipate need for OT follow up after discharge.    If plan is discharge home, recommend the following:   A little help with walking and/or transfers;A little help with bathing/dressing/bathroom;Assistance with cooking/housework;Assist for transportation;Help with stairs or ramp for entrance     Functional Status Assessment   Patient has had a recent decline in their functional status and demonstrates the ability to make significant improvements in function in a reasonable and predictable amount of time.     Equipment Recommendations   BSC/3in1     Recommendations for Other Services         Precautions/Restrictions   Precautions Precautions: Fall;Other (comment) Recall of Precautions/Restrictions: Intact Precaution/Restrictions Comments: monitor HR Restrictions Weight Bearing Restrictions  Per Provider Order: No     Mobility Bed Mobility Overal bed mobility: Needs Assistance Bed Mobility: Rolling, Supine to Sit, Sit to Supine Rolling: Supervision   Supine to sit: Supervision Sit to supine: Supervision        Transfers Overall transfer level: Needs assistance Equipment used: Rolling walker (2 wheels), None Transfers: Sit to/from Stand Sit to Stand: Contact guard assist           General transfer comment: approcahing supervision      Balance Overall balance assessment: Needs assistance Sitting-balance support: Feet unsupported, No upper extremity supported Sitting balance-Leahy Scale: Fair       Standing balance-Leahy Scale: Fair                             ADL either performed or assessed with clinical judgement   ADL Overall ADL's : Needs assistance/impaired Eating/Feeding: Independent;Sitting   Grooming: Supervision/safety;Standing   Upper Body Bathing: Set up;Sitting   Lower Body Bathing: Contact guard assist;Sit to/from stand   Upper Body Dressing : Set up;Sitting   Lower Body Dressing: Supervision/safety;Sit to/from stand   Toilet Transfer: Contact guard assist;Ambulation;Rolling walker (2 wheels)   Toileting- Clothing Manipulation and Hygiene: Contact guard assist;Sit to/from stand       Functional mobility during ADLs: Contact guard assist;Rolling walker (2 wheels) General ADL Comments: approaching supervision     Vision   Additional Comments: not formally assessed     Perception         Praxis         Pertinent Vitals/Pain Pain Assessment Pain Assessment: No/denies pain     Extremity/Trunk Assessment Upper Extremity Assessment Upper Extremity Assessment: Generalized weakness   Lower Extremity Assessment Lower Extremity Assessment: Defer to  PT evaluation   Cervical / Trunk Assessment Cervical / Trunk Assessment: Kyphotic   Communication Communication Communication: No apparent difficulties    Cognition Arousal: Alert Behavior During Therapy: WFL for tasks assessed/performed Cognition: Cognition impaired     Awareness: Online awareness impaired Memory impairment (select all impairments): Short-term memory Attention impairment (select first level of impairment): Sustained attention, Selective attention Executive functioning impairment (select all impairments): Organization, Problem solving OT - Cognition Comments: follows basic commands, is oriented with increased time.                 Following commands: Intact       Cueing  General Comments   Cueing Techniques: Verbal cues;Tactile cues;Visual cues      Exercises     Shoulder Instructions      Home Living Family/patient expects to be discharged to:: Private residence Living Arrangements: Children Available Help at Discharge: Family;Available 24 hours/day Type of Home: House Home Access: Stairs to enter [employer] of Steps: 3 Entrance Stairs-Rails: Right;Left Home Layout: One level;Other (Comment) (1 step in home going in/out of restroom)     Bathroom Shower/Tub: Producer, television/film/video: Standard     Home Equipment: Shower seat;Grab bars - tub/shower;Rolling Environmental consultant (2 wheels);Hand held shower head;Cane - single point          Prior Functioning/Environment Prior Level of Function : Independent/Modified Independent;Driving             Mobility Comments: Patient ambulates without AD. She does report driving still but not very often. No falls in the past year. ADLs Comments: Independent in ADL. per grand daughter in room, family assists with finances and medication management    OT Problem List: Decreased strength;Decreased activity tolerance;Impaired balance (sitting and/or standing);Decreased cognition;Decreased safety awareness;Decreased knowledge of use of DME or AE   OT Treatment/Interventions: Self-care/ADL training;Therapeutic exercise;DME and/or AE  instruction;Cognitive remediation/compensation;Patient/family education;Balance training      OT Goals(Current goals can be found in the care plan section)   Acute Rehab OT Goals Patient Stated Goal: go home OT Goal Formulation: With patient Time For Goal Achievement: 09/25/23 Potential to Achieve Goals: Good   OT Frequency:  Min 2X/week    Co-evaluation              AM-PAC OT 6 Clicks Daily Activity     Outcome Measure Help from another person eating meals?: None Help from another person taking care of personal grooming?: A Little Help from another person toileting, which includes using toliet, bedpan, or urinal?: A Little Help from another person bathing (including washing, rinsing, drying)?: A Little Help from another person to put on and taking off regular upper body clothing?: A Little Help from another person to put on and taking off regular lower body clothing?: A Little 6 Click Score: 19   End of Session Equipment Utilized During Treatment: Rolling walker (2 wheels) Nurse Communication: Mobility status  Activity Tolerance: Patient tolerated treatment well Patient left: in bed;with bed alarm set;with call bell/phone within reach;with family/visitor present;with nursing/sitter in room  OT Visit Diagnosis: Unsteadiness on feet (R26.81);Muscle weakness (generalized) (M62.81);Other symptoms and signs involving cognitive function                Time: 8871-8844 OT Time Calculation (min): 27 min Charges:  OT General Charges $OT Visit: 1 Visit OT Evaluation $OT Eval Low Complexity: 1 Low OT Treatments $Self Care/Home Management : 8-22 mins  Elma JONETTA Lebron FREDERICK, OTR/L [facility] Acute Rehabilitation Office: [phone]  Diara Chaudhari D Walton 09/11/2023, 12:20 PM

## 2023-09-12 DIAGNOSIS — Z515 Encounter for palliative care: Secondary | ICD-10-CM | POA: Diagnosis not present

## 2023-09-12 DIAGNOSIS — I5023 Acute on chronic systolic (congestive) heart failure: Secondary | ICD-10-CM | POA: Diagnosis not present

## 2023-09-12 DIAGNOSIS — Z7189 Other specified counseling: Secondary | ICD-10-CM | POA: Diagnosis not present

## 2023-09-12 LAB — BASIC METABOLIC PANEL WITH GFR
Anion gap: 12 (ref 5–15)
BUN: 23 mg/dL (ref 8–23)
CO2: 24 mmol/L (ref 22–32)
Calcium: 8.8 mg/dL — ABNORMAL LOW (ref 8.9–10.3)
Chloride: 102 mmol/L (ref 98–111)
Creatinine, Ser: 1.43 mg/dL — ABNORMAL HIGH (ref 0.44–1.00)
GFR, Estimated: 35 mL/min — ABNORMAL LOW (ref >60)
Glucose, Bld: 138 mg/dL — ABNORMAL HIGH (ref 70–99)
Potassium: 3.4 mmol/L — ABNORMAL LOW (ref 3.5–5.1)
Sodium: 138 mmol/L (ref 135–145)

## 2023-09-12 LAB — CBC
HCT: 28 % — ABNORMAL LOW (ref 36.0–46.0)
Hemoglobin: 9 g/dL — ABNORMAL LOW (ref 12.0–15.0)
MCH: 23.1 pg — ABNORMAL LOW (ref 26.0–34.0)
MCHC: 32.1 g/dL (ref 30.0–36.0)
MCV: 71.8 fL — ABNORMAL LOW (ref 80.0–100.0)
Platelets: 399 K/uL (ref 150–400)
RBC: 3.9 MIL/uL (ref 3.87–5.11)
RDW: 19.5 % — ABNORMAL HIGH (ref 11.5–15.5)
WBC: 5.5 K/uL (ref 4.0–10.5)
nRBC: 2.9 % — ABNORMAL HIGH (ref 0.0–0.2)

## 2023-09-12 LAB — COOXEMETRY PANEL
Carboxyhemoglobin: 0.9 % (ref 0.5–1.5)
Carboxyhemoglobin: 1.3 % (ref 0.5–1.5)
Methemoglobin: <0.7 % (ref 0.0–1.5)
Methemoglobin: <0.7 % (ref 0.0–1.5)
O2 Saturation: 43.9 %
O2 Saturation: 51.8 %
Total hemoglobin: 9.2 g/dL — ABNORMAL LOW (ref 12.0–16.0)
Total hemoglobin: 8.8 g/dL — ABNORMAL LOW (ref 12.0–16.0)

## 2023-09-12 MED ORDER — SPIRONOLACTONE 25 MG PO TABS
25.0000 mg | ORAL_TABLET | Freq: Every day | ORAL | Status: DC
  Administered 2023-09-13 – 2023-09-14 (×2): 25 mg via ORAL
  Filled 2023-09-12 (×2): qty 1

## 2023-09-12 MED ORDER — LOSARTAN POTASSIUM 25 MG PO TABS
12.5000 mg | ORAL_TABLET | Freq: Every evening | ORAL | Status: DC
  Administered 2023-09-12: 12.5 mg via ORAL
  Filled 2023-09-12: qty 1

## 2023-09-12 MED ORDER — POTASSIUM CHLORIDE CRYS ER 20 MEQ PO TBCR
20.0000 meq | EXTENDED_RELEASE_TABLET | Freq: Once | ORAL | Status: AC
  Administered 2023-09-12: 20 meq via ORAL
  Filled 2023-09-12: qty 1

## 2023-09-12 MED ORDER — ORAL CARE MOUTH RINSE: 15.0000 mL | OROMUCOSAL | Status: DC | PRN

## 2023-09-12 NOTE — Progress Notes (Signed)
   Palliative Medicine Inpatient Follow Up Note HPI: 89/F w chronic diastolic dysfunction CHF, pulmonary embolism, HTN, small B-cell lymphoma of the spleen/non-Hodgkin's lymphoma, PAD, essential thrombocytosis, JAK2 plus myelo proliferative disorder, MGUS and GERD Presented to [facility] ED with complaints of chest discomfort increasing shortness of breath and nausea for the last couple days.   Today's Discussion 09/12/2023  *Please note that this is a verbal dictation therefore any spelling or grammatical errors are due to the Dragon Medical One system interpretation.  Chart reviewed inclusive of vital signs, progress notes, laboratory results, and diagnostic images.   I met with Leslie Williamson, her son Leslie Williamson, and her granddaughter, Leslie Williamson.  Leslie Williamson and I discussed her present clinical state in the setting of her heart failure. At this time she is feeling well. Denies pain, shortness of breath, or nausea. She shares that she is awaiting the heart failure team to guide she and her family regarding next steps for care.   Per patients son, he is waiting on the heart failure team to discuss patients progress with him for further conversations.  Per note review plan for milrinone  wean today - further discussed will ensue thereafter,   Questions and concerns addressed/Palliative Support Provided.   Objective Assessment: Vital Signs Vitals:   09/12/23 1047 09/12/23 1050  BP:  91/63  Pulse:    Resp:  17  Temp:    SpO2: 97%    No intake or output data in the 24 hours ending 09/12/23 1244 Last Weight  Most recent update: 09/12/2023  6:09 AM    Weight  53.6 kg (118 lb 2.7 oz)            Gen:  Elderly AA F chronically ill appearing HEENT: moist mucous membranes CV: Regular rate and rhythm  PULM:  On RA, breathing is even and nonlabored ABD: soft/nontender  EXT: (+) muscle wasting, trace BLE edema  Neuro: Alert and oriented x3   SUMMARY OF RECOMMENDATIONS   DNAR/DNI  Milrinone  wean  started  Additional conversations to ensure pending patients improvements/decline with wean  PMT will remains involved and supportive of family decisions ______________________________________________________________________________________ Leslie Williamson [place] Palliative Medicine Team Team Cell Phone: [phone] Please utilize secure chat with additional questions, if there is no response within 30 minutes please call the above phone number  Time Spent: 35 Billing based on MDM: Moderate  Palliative Medicine Team providers are available by phone from 7am to 7pm daily and can be reached through the team cell phone.  Should this patient require assistance outside of these hours, please call the patient's attending physician.

## 2023-09-12 NOTE — Progress Notes (Signed)
 PROGRESS NOTE    Leslie Williamson  FMW:[id number] DOB: [birth year] DOA: 09/05/2023 PCP: Carlette Benita Area, MD  89/F w chronic diastolic dysfunction CHF, pulmonary embolism, HTN, small B-cell lymphoma of the spleen/non-Hodgkin's lymphoma, PAD, essential thrombocytosis, JAK2 plus myelo proliferative disorder, MGUS and GERD Presented to [facility] ED with complaints of chest discomfort increasing shortness of breath and nausea for the last couple days. - Workup in the ED noted bilateral pleural effusions, BNP more than 4500, lactic acidosis, elevated creatinine - Echo noted EF of 20%, normal RV - Seen by palliative care, cardiology, started on IV milrinone  and transferred to [facility], arrived after midnight 7/18  Subjective: - Feels better overall, breathing improving  Assessment and Plan:  Acute systolic CHF Low output failure -2D echo noted EF down to 20%, normal RV, suspected to be secondary to Adriamycin  -Complicated by lactic acidosis, AKI, after discussion with oncology and palliative care she was started on IV milrinone  and transferred to [facility] - Volume status improving, conservative management - CHF team following, started on digoxin , Aldactone , mains tachycardic on milrinone  with soft blood pressures, not a candidate for advanced therapies, palliative care following - Cardiac MRI with low RV and LVEF - Poor candidate for home milrinone  therapy, plan to wean off milrinone , if she does not tolerate this would require hospice, updated son at bedside - Increase activity   AKI -- cardiorenal, stable   Transaminitis -- Second to passive hepatic congestion from CHF, improving   H/o PE -- remains on apixaban     Multiple Myeloma  Non-Hodgkins Lymphoma  -- she is followed by Dr. Rogers -- completed 6 cycles of R mini CHOP -- My partner Dr.Johnson discussed with oncologist Dr MARLA who knows patient well, he felt  her cancers are well controlled now, myeloma has been quiescent, says she  likely developed heart failure from adriamycin  therapy which is now completed and now to complete remainder of lymphoma treatment with radiation therapy to the leg   Chronic anemia -Likely multifactorial, overall improving, monitor   DVT prophylaxis: apixaban   Code Status: DNR  Communication: Son at bedside Disposition Plan: Possibly home this week, may need hospice  Consultants: CHF team   Procedures:   Antimicrobials:    Objective: Vitals:   09/12/23 0500 09/12/23 0743 09/12/23 0800 09/12/23 1047  BP:  104/65    Pulse:      Resp:  (!) 26 (!) 24   Temp:  97.9 F (36.6 C)    TempSrc:  Oral  Oral  SpO2:  96%  97%  Weight: 53.6 kg     Height:       No intake or output data in the 24 hours ending 09/12/23 1202  Filed Weights   09/10/23 0607 09/11/23 0500 09/12/23 0500  Weight: 53.1 kg 53.1 kg 53.6 kg    Examination:  General exam: Chronically ill elderly, AAO x 3 HEENT: No JVD, Port-A-Cath noted CVS: S1-S2, regular rhythm Lungs: Decreased breath sounds at the bases Abdomen: Soft, nontender, bowel sounds present Remedies: Trace edema Skin: No rashes Psychiatry: Flat affect    Data Reviewed:   CBC: Recent Labs  Lab 09/05/23 1345 09/06/23 0507 09/08/23 0405 09/09/23 0430 09/10/23 0500 09/11/23 0200 09/12/23 0310  WBC 5.6   < > 5.0 6.8 5.1 5.8 5.5  NEUTROABS 3.3  --   --   --   --   --   --   HGB 9.0*   < > 8.0* 9.3* 8.7* 8.0* 9.0*  HCT 28.3*   < >  25.6* 29.2* 27.3* 25.0* 28.0*  MCV 74.9*   < > 73.8* 72.6* 72.8* 72.3* 71.8*  PLT 363   < > 331 373 371 330 399   < > = values in this interval not displayed.   Basic Metabolic Panel: Recent Labs  Lab 09/05/23 1410 09/06/23 0507 09/07/23 0420 09/08/23 0405 09/09/23 0430 09/09/23 0730 09/10/23 0500 09/11/23 0200 09/12/23 0310  NA  --  143   < > 143 141 138 143 137 138  K  --  3.7   < > 3.9 3.8 3.8 3.4* 3.6 3.4*  CL  --  113*   < > 113* 107 104 106 103 102  CO2  --  20*   < > 22 24 22 26 24 24    GLUCOSE  --  91   < > 101* 127* 125* 121* 111* 138*  BUN  --  55*   < > 32* 24* 24* 19 21 23   CREATININE  --  1.73*   < > 1.27* 1.15* 1.28* 1.25* 1.37* 1.43*  CALCIUM   --  8.3*   < > 8.3* 8.9 8.9 8.6* 8.3* 8.8*  MG 2.1  --   --  1.7 1.7  --  1.7 2.3  --   PHOS  --  4.5  --   --   --   --   --   --   --    < > = values in this interval not displayed.   GFR: Estimated Creatinine Clearance: 18.8 mL/min (A) (by C-G formula based on SCr of 1.43 mg/dL (H)). Liver Function Tests: Recent Labs  Lab 09/08/23 0405 09/09/23 0430 09/09/23 0730 09/10/23 0500 09/11/23 0200  AST 31 26 27 19 17   ALT 85* 74* 73* 51* 39  ALKPHOS 120 110 112 101 90  BILITOT 0.9 1.1 0.9 1.0 0.8  PROT 4.4* 5.0* 5.1* 4.7* 4.4*  ALBUMIN 2.7* 3.1* 3.1* 2.9* 2.8*   No results for input(s): LIPASE, AMYLASE in the last 168 hours. No results for input(s): AMMONIA in the last 168 hours. Coagulation Profile: No results for input(s): INR, PROTIME in the last 168 hours. Cardiac Enzymes: No results for input(s): CKTOTAL, CKMB, CKMBINDEX, TROPONINI in the last 168 hours. BNP (last 3 results) No results for input(s): PROBNP in the last 8760 hours. HbA1C: No results for input(s): HGBA1C in the last 72 hours. CBG: No results for input(s): GLUCAP in the last 168 hours. Lipid Profile: No results for input(s): CHOL, HDL, LDLCALC, TRIG, CHOLHDL, LDLDIRECT in the last 72 hours. Thyroid Function Tests: No results for input(s): TSH, T4TOTAL, FREET4, T3FREE, THYROIDAB in the last 72 hours. Anemia Panel: No results for input(s): VITAMINB12, FOLATE, FERRITIN, TIBC, IRON , RETICCTPCT in the last 72 hours. Urine analysis:    Component Value Date/Time   COLORURINE YELLOW 09/06/2023 1400   APPEARANCEUR CLEAR 09/06/2023 1400   LABSPEC 1.010 09/06/2023 1400   PHURINE 5.0 09/06/2023 1400   GLUCOSEU 150 (A) 09/06/2023 1400   HGBUR NEGATIVE 09/06/2023 1400   HGBUR large  06/19/2007 0845   BILIRUBINUR NEGATIVE 09/06/2023 1400   BILIRUBINUR negative 08/05/2021 1334   KETONESUR NEGATIVE 09/06/2023 1400   PROTEINUR NEGATIVE 09/06/2023 1400   UROBILINOGEN 0.2 08/05/2021 1334   UROBILINOGEN 0.2 07/03/2012 1929   NITRITE NEGATIVE 09/06/2023 1400   LEUKOCYTESUR NEGATIVE 09/06/2023 1400   Sepsis Labs: @LABRCNTIP (procalcitonin:4,lacticidven:4)  ) Recent Results (from the past 240 hours)  Resp panel by RT-PCR (RSV, Flu A&B, Covid) Anterior Nasal Swab     Status:  None   Collection Time: 09/05/23  4:33 PM   Specimen: Anterior Nasal Swab  Result Value Ref Range Status   SARS Coronavirus 2 by RT PCR NEGATIVE NEGATIVE Final    Comment: (NOTE) SARS-CoV-2 target nucleic acids are NOT DETECTED.  The SARS-CoV-2 RNA is generally detectable in upper respiratory specimens during the acute phase of infection. The lowest concentration of SARS-CoV-2 viral copies this assay can detect is 138 copies/mL. A negative result does not preclude SARS-Cov-2 infection and should not be used as the sole basis for treatment or other patient management decisions. A negative result may occur with  improper specimen collection/handling, submission of specimen other than nasopharyngeal swab, presence of viral mutation(s) within the areas targeted by this assay, and inadequate number of viral copies(<138 copies/mL). A negative result must be combined with clinical observations, patient history, and epidemiological information. The expected result is Negative.  Fact Sheet for Patients:  BloggerCourse.com  Fact Sheet for Healthcare Providers:  SeriousBroker.it  This test is no t yet approved or cleared by the [place]  FDA and  has been authorized for detection and/or diagnosis of SARS-CoV-2 by FDA under an Emergency Use Authorization (EUA). This EUA will remain  in effect (meaning this test can be used) for the duration of  the COVID-19 declaration under Section 564(b)(1) of the Act, 21 U.S.C.section 360bbb-3(b)(1), unless the authorization is terminated  or revoked sooner.       Influenza A by PCR NEGATIVE NEGATIVE Final   Influenza B by PCR NEGATIVE NEGATIVE Final    Comment: (NOTE) The Xpert Xpress SARS-CoV-2/FLU/RSV plus assay is intended as an aid in the diagnosis of influenza from Nasopharyngeal swab specimens and should not be used as a sole basis for treatment. Nasal washings and aspirates are unacceptable for Xpert Xpress SARS-CoV-2/FLU/RSV testing.  Fact Sheet for Patients: BloggerCourse.com  Fact Sheet for Healthcare Providers: SeriousBroker.it  This test is not yet approved or cleared by the [place]  FDA and has been authorized for detection and/or diagnosis of SARS-CoV-2 by FDA under an Emergency Use Authorization (EUA). This EUA will remain in effect (meaning this test can be used) for the duration of the COVID-19 declaration under Section 564(b)(1) of the Act, 21 U.S.C. section 360bbb-3(b)(1), unless the authorization is terminated or revoked.     Resp Syncytial Virus by PCR NEGATIVE NEGATIVE Final    Comment: (NOTE) Fact Sheet for Patients: BloggerCourse.com  Fact Sheet for Healthcare Providers: SeriousBroker.it  This test is not yet approved or cleared by the [place]  FDA and has been authorized for detection and/or diagnosis of SARS-CoV-2 by FDA under an Emergency Use Authorization (EUA). This EUA will remain in effect (meaning this test can be used) for the duration of the COVID-19 declaration under Section 564(b)(1) of the Act, 21 U.S.C. section 360bbb-3(b)(1), unless the authorization is terminated or revoked.  Performed at [facility], [address], [place], [place] [address]   MRSA Next Gen by PCR, Nasal     Status: None   Collection Time: 09/06/23  5:45  PM   Specimen: Nasal Mucosa; Nasal Swab  Result Value Ref Range Status   MRSA by PCR Next Gen NOT DETECTED NOT DETECTED Final    Comment: (NOTE) The GeneXpert MRSA Assay (FDA approved for NASAL specimens only), is one component of a comprehensive MRSA colonization surveillance program. It is not intended to diagnose MRSA infection nor to guide or monitor treatment for MRSA infections. Test performance is not FDA approved in patients less than  4 years old. Performed at [facility], [address] [place], [place] [address]      Radiology Studies: No results found.    Scheduled Meds:  acyclovir   400 mg Oral BID   allopurinol   300 mg Oral Daily   apixaban   5 mg Oral BID   Chlorhexidine  Gluconate Cloth  6 each Topical Daily   digoxin   0.0625 mg Oral Daily   losartan   12.5 mg Oral QPM   potassium chloride   40 mEq Oral Once   sodium chloride  flush  3 mL Intravenous Q12H   [START ON 09/13/2023] spironolactone   25 mg Oral Daily   Continuous Infusions:  milrinone  0.125 mcg/kg/min (09/12/23 1132)     LOS: 7 days    Time spent:    Sigurd Pac, MD [facility]   09/12/2023, 12:02 PM

## 2023-09-12 NOTE — Plan of Care (Signed)
 Patient feeling better more oriented and Cards decreased her milrinone  drip today

## 2023-09-12 NOTE — Progress Notes (Signed)
 Advanced Heart Failure Rounding Note  Cardiologist: None   Chief Complaint: Acute on chronic CHF w/ new biventricular failure  Subjective:    CO-OX 44% this am on 0.25 milrinone .  Received 80 mg lasix  IV yesterday. Is/Os not complete.  Reports feeling well. No dyspnea at rest. No recurrent nausea. Worked with PT/OT over the weekend. Remains weak and needs help with mobility.   Objective:   Weight Range: 53.6 kg Body mass index is 23.08 kg/m.   Vital Signs:   Temp:  [98.2 F (36.8 C)] 98.2 F (36.8 C) (07/20 1559) Pulse Rate:  [114] 114 (07/20 1136) Resp:  [17-20] 17 (07/20 1559) BP: (94-104)/(64-70) 94/64 (07/20 1559) SpO2:  [95 %-96 %] 96 % (07/20 1559) Weight:  [53.6 kg] 53.6 kg (07/21 0500) Last BM Date : 09/11/23  Weight change: Filed Weights   09/10/23 0607 09/11/23 0500 09/12/23 0500  Weight: 53.1 kg 53.1 kg 53.6 kg    Intake/Output:  No intake or output data in the 24 hours ending 09/12/23 0743    Physical Exam    General:  Chronically ill appearing elderly female Cor: JVP ~ 8 cm Regular rate & rhythm, tachy. No murmurs. Lungs: Clear Abdomen: Soft, nontender, nondistended.  Extremities: No edema Neuro: Alert & orientedx3. Affect pleasant   Telemetry   Sinus tach, 110s-120s  Labs    CBC Recent Labs    09/11/23 0200 09/12/23 0310  WBC 5.8 5.5  HGB 8.0* 9.0*  HCT 25.0* 28.0*  MCV 72.3* 71.8*  PLT 330 399   Basic Metabolic Panel Recent Labs    92/80/74 0500 09/11/23 0200 09/12/23 0310  NA 143 137 138  K 3.4* 3.6 3.4*  CL 106 103 102  CO2 26 24 24   GLUCOSE 121* 111* 138*  BUN 19 21 23   CREATININE 1.25* 1.37* 1.43*  CALCIUM  8.6* 8.3* 8.8*  MG 1.7 2.3  --    Liver Function Tests Recent Labs    09/10/23 0500 09/11/23 0200  AST 19 17  ALT 51* 39  ALKPHOS 101 90  BILITOT 1.0 0.8  PROT 4.7* 4.4*  ALBUMIN 2.9* 2.8*   No results for input(s): LIPASE, AMYLASE in the last 72 hours. Cardiac Enzymes No results for  input(s): CKTOTAL, CKMB, CKMBINDEX, TROPONINI in the last 72 hours.  BNP: BNP (last 3 results) Recent Labs    09/05/23 1410 09/07/23 0420  BNP >4,500.0* >4,500.0*    ProBNP (last 3 results) No results for input(s): PROBNP in the last 8760 hours.   D-Dimer No results for input(s): DDIMER in the last 72 hours. Hemoglobin A1C No results for input(s): HGBA1C in the last 72 hours. Fasting Lipid Panel No results for input(s): CHOL, HDL, LDLCALC, TRIG, CHOLHDL, LDLDIRECT in the last 72 hours. Thyroid Function Tests No results for input(s): TSH, T4TOTAL, T3FREE, THYROIDAB in the last 72 hours.  Invalid input(s): FREET3  Other results:   Imaging    No results found.   Medications:     Scheduled Medications:  acyclovir   400 mg Oral BID   allopurinol   300 mg Oral Daily   apixaban   5 mg Oral BID   Chlorhexidine  Gluconate Cloth  6 each Topical Daily   digoxin   0.0625 mg Oral Daily   potassium chloride   40 mEq Oral Once   sodium chloride  flush  3 mL Intravenous Q12H   spironolactone   12.5 mg Oral Daily    Infusions:  milrinone  0.25 mcg/kg/min (09/11/23 1624)    PRN Medications: acetaminophen  **OR**  acetaminophen , bisacodyl , guaiFENesin -dextromethorphan , ondansetron  **OR** ondansetron  (ZOFRAN ) IV, mouth rinse, polyethylene glycol, sodium chloride  flush, traZODone     Patient Profile   89 y.o. 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 Admitted with acute on chronic CHF with newly reduced EF w/ biventricular dysfunction and cardiogenic shock in setting of recent Adriamycin .    Assessment/Plan   Acute on chronic CHF with newly reduced EF and biventricular dysfunction/cardiogenic shock: -EF previously 45-50% -Echo 07/25: EF 20%, RV okay -CM most likely 2/2 Adriamycin . However, ECG with anterior Qs and hx chest pain on  presentation.  -cMRI w/ LVEF 22% and RVEF 28%, nonspecific LGE, moderate MR -CO-OX 44% on 0.25 milrinone . Recheck -Volume okay -Continue spiro 12.5 mg daily -Continue digoxin  0.0625 mg daily -Little BP room for GDMT -She is tenuous. Tachycardic with soft BP. Not sure if she will tolerate milrinone  wean. In that case, would then recommend more palliative approach. Discussed with granddaughter, Rolin, at bedside. Her uncle, who helps with decisions, will be arriving later today. - Palliative Care consulted for GOC discussions. She is DNR.   2. B cell lymphoma - S/p RCHOP -PET from 06/25 with mild increase in hypermetabolic activity involving soft tissue mass in RLE  -Planning for radiation therapy in the future   3. AKI on CKD IIIa -Scr baseline around 1.1, Cr up to 1.9 on admit -Improved with inotrope support and diuresis   4. Elevated LFTs -Likely d/t passive congestion and cardiogenic shock -Improving.   5. Hx PE -On Eliquis   Length of Stay: 7  Celso Granja N, PA-C  09/12/2023, 7:43 AM  Advanced Heart Failure Team Pager [phone] (M-F; 7a - 5p)  Please contact [facility] for night-coverage after hours (5p -7a ) and weekends on [email or web address]

## 2023-09-12 NOTE — Progress Notes (Signed)
 Mobility Specialist Progress Note:    09/12/23 0930  Mobility  Activity Ambulated with assistance in hallway  Level of Assistance Contact guard assist, steadying assist  Assistive Device None  Distance Ambulated (ft) 100 ft  Activity Response Tolerated fair  Mobility Referral Yes  Mobility visit 1 Mobility  Mobility Specialist Start Time (ACUTE ONLY) 0930  Mobility Specialist Stop Time (ACUTE ONLY) 0941  Mobility Specialist Time Calculation (min) (ACUTE ONLY) 11 min   Received pt sitting in recliner agreeable and excited for session. No c/o any symptoms. Originally was going to use RW but pt stated they felt well and was not needed. Turned back to room once noticing pt HR was creeping towards the mid 130's. Left pt in recliner w/ no c/o and all needs met.   Venetia Keel Mobility Specialist Please Neurosurgeon or Rehab Office at [phone]

## 2023-09-13 DIAGNOSIS — Z7189 Other specified counseling: Secondary | ICD-10-CM | POA: Diagnosis not present

## 2023-09-13 DIAGNOSIS — Z515 Encounter for palliative care: Secondary | ICD-10-CM | POA: Diagnosis not present

## 2023-09-13 LAB — BASIC METABOLIC PANEL WITH GFR
Anion gap: 6 (ref 5–15)
BUN: 22 mg/dL (ref 8–23)
CO2: 26 mmol/L (ref 22–32)
Calcium: 8.8 mg/dL — ABNORMAL LOW (ref 8.9–10.3)
Chloride: 103 mmol/L (ref 98–111)
Creatinine, Ser: 1.43 mg/dL — ABNORMAL HIGH (ref 0.44–1.00)
GFR, Estimated: 35 mL/min — ABNORMAL LOW (ref >60)
Glucose, Bld: 106 mg/dL — ABNORMAL HIGH (ref 70–99)
Potassium: 4 mmol/L (ref 3.5–5.1)
Sodium: 135 mmol/L (ref 135–145)

## 2023-09-13 LAB — COOXEMETRY PANEL
Carboxyhemoglobin: 0.6 % (ref 0.5–1.5)
Carboxyhemoglobin: 1.2 % (ref 0.5–1.5)
Methemoglobin: <0.7 % (ref 0.0–1.5)
Methemoglobin: <0.7 % (ref 0.0–1.5)
O2 Saturation: 46.1 %
O2 Saturation: 48 %
Total hemoglobin: 8.3 g/dL — ABNORMAL LOW (ref 12.0–16.0)
Total hemoglobin: 8.6 g/dL — ABNORMAL LOW (ref 12.0–16.0)

## 2023-09-13 MED ORDER — HYDROCODONE-ACETAMINOPHEN 5-325 MG PO TABS
1.0000 | ORAL_TABLET | Freq: Four times a day (QID) | ORAL | Status: DC | PRN
  Administered 2023-09-14: 1 via ORAL
  Filled 2023-09-13: qty 1

## 2023-09-13 MED ORDER — DIPHENHYDRAMINE HCL 50 MG/ML IJ SOLN
50.0000 mg | Freq: Three times a day (TID) | INTRAMUSCULAR | Status: DC | PRN
  Administered 2023-09-13: 50 mg via INTRAVENOUS
  Filled 2023-09-13: qty 1

## 2023-09-13 NOTE — Progress Notes (Signed)
 Physical Therapy Treatment Patient Details Name: Leslie Williamson MRN: [id number] DOB: 08/03/1931 Today's Date: 09/13/2023   History of Present Illness Patient is 89 yo female admitted on 09/05/23 for shortness of breath and nausea found to have Acute on chronic heart failure with reduced ejection fraction/chemo induced cardiomyopathy. Recent radiation treatments to R LE. PMH significant for chronic diastolic dysfunction CHF, pulmonary embolism, HTN, small B-cell lymphoma of the spleen/non-Hodgkin's lymphoma, PAD, essential thrombocytosis, multiple myeloma, JAK2 plus myelo proliferative disorder, MGUS and GERD.    PT Comments  Pt received in supine, resting and family requesting PTA not get her OOB, but family had questions and agreeable to bed-level session focusing on education and discussion on DME needs post-acute. Pt family instructed on IS use (IS brought to room) and frequency, supine HEP for comfort/as able, benefits of mobility and positioning for pressure relief/aspiration prevention, and  breathing exercises to build endurance/reduce risk of PNA. Pt continues to benefit from PT services to progress toward functional mobility goals, DME recommendations updated below per discussion with supervising PT Beth VF.    If plan is discharge home, recommend the following: A lot of help with bathing/dressing/bathroom;A lot of help with walking and/or transfers;Assistance with cooking/housework;Help with stairs or ramp for entrance   Can travel by private vehicle        Equipment Recommendations  BSC/3in1;Hospital bed;Cane    Recommendations for Other Services       Precautions / Restrictions Precautions Precautions: Fall;Other (comment) Recall of Precautions/Restrictions: Intact Precaution/Restrictions Comments: monitor HR Restrictions Weight Bearing Restrictions Per Provider Order: No     Mobility  Bed Mobility Overal bed mobility: Needs Assistance             General bed  mobility comments: Family defer, pt resting but family interested in education so PTA brought handouts, see above    Transfers Overall transfer level: Needs assistance                 General transfer comment: pt resting so family defer    Ambulation/Gait                   Stairs             Wheelchair Mobility     Tilt Bed    Modified Rankin (Stroke Patients Only)       Balance Overall balance assessment: Needs assistance Sitting-balance support: Feet unsupported, No upper extremity supported Sitting balance-Leahy Scale: Fair         Standing balance comment: defer, pt resting                            Communication Communication Communication: No apparent difficulties (pt sleeping)  Cognition Arousal: Lethargic (sleeping mostly)     PT - Cognitive impairments: Difficult to assess Difficult to assess due to: Level of arousal                     PT - Cognition Comments: Primary session focus on family education as pt plan to DC home following date with home hospice most likely. Pt female family members x2 instructed on IS use, stair safety (cane handout for railing + cane), supine HEP and breathing exercises with handouts provided. Following commands: Intact      Cueing Cueing Techniques: Verbal cues, Gestural cues  Exercises Other Exercises Other Exercises: family members x2 instructed on use of IS, supine HEP, postural changes and sitting  upright after meals to reduce risk of aspiration, breathing exercises for strengthening/endurance building if pt interested. Family receptive.    General Comments General comments (skin integrity, edema, etc.): VSS, no acute s/sx distress while PTA in room pt resting quietly      Pertinent Vitals/Pain Pain Assessment Pain Assessment: No/denies pain    Home Living                          Prior Function            PT Goals (current goals can now be found in the  care plan section) Acute Rehab PT Goals Patient Stated Goal: Patient's goal is to return home. PT Goal Formulation: With patient Time For Goal Achievement: 09/24/23 Progress towards PT goals: Progressing toward goals    Frequency    Min 2X/week      PT Plan      Co-evaluation              AM-PAC PT 6 Clicks Mobility   Outcome Measure  Help needed turning from your back to your side while in a flat bed without using bedrails?: A Little Help needed moving from lying on your back to sitting on the side of a flat bed without using bedrails?: A Little Help needed moving to and from a bed to a chair (including a wheelchair)?: A Little Help needed standing up from a chair using your arms (e.g., wheelchair or bedside chair)?: A Little Help needed to walk in hospital room?: A Lot Help needed climbing 3-5 steps with a railing? : A Lot (based on previous session with mobility) 6 Click Score: 16    End of Session   Activity Tolerance: Patient limited by fatigue Patient left: in bed;with call bell/phone within reach;with bed alarm set;with family/visitor present Nurse Communication: Mobility status;Other (comment) (family educated on questions) PT Visit Diagnosis: Other abnormalities of gait and mobility (R26.89);Muscle weakness (generalized) (M62.81)     Time: 8490-8472 PT Time Calculation (min) (ACUTE ONLY): 18 min  Charges:    $Therapeutic Activity: 8-22 mins PT General Charges $$ ACUTE PT VISIT: 1 Visit                     Cleo Santucci P., PTA Acute Rehabilitation Services Secure Chat Preferred 9a-5:30pm Office: [phone]    Connell HERO [facility] 09/13/2023, 4:05 PM

## 2023-09-13 NOTE — TOC Progression Note (Signed)
 Transition of Care [facility]) - Progression Note    Patient Details  Name: Leslie Williamson MRN: [id number] Date of Birth: [birth year]  Transition of Care [facility]) CM/SW Contact  Justina Delcia Czar, RN Phone Number: [phone] 09/13/2023, 12:39 PM  Clinical Narrative:     Beatris to sons, Lynwood and [name] Meigs choice for Home Hospice. Medicare.gov listing with ratings given and placed on chart. Son, Lynwood agreeable to [facility] for [facility]. Contacted [facility] rep, Kwante with referral. She will call son to get DME arranged. Pt will need hospital bed.   Scheduled dc tomorrow once Home Hospice has been arranged. Sons will provide transportation home.   Expected Discharge Plan: Home w Hospice Care Barriers to Discharge: Continued Medical Work up  Expected Discharge Plan and Services In-house Referral: Clinical Social Work Discharge Planning Services: CM Consult Post Acute Care Choice: Hospice Living arrangements for the past 2 months: Single Family Home                 DME Arranged: 3-N-1 DME Agency: [facility] Date DME Agency Contacted: 09/09/23 Time DME Agency Contacted: [phone] Representative spoke with at DME Agency: Ryan HH Arranged: RN HH Agency: [facility]) Date [facility] Agency Contacted: 09/13/23 Time HH Agency Contacted: 1238 Representative spoke with at [facility] Agency: Darleene Gowda   Social Determinants of Health (SDOH) Interventions SDOH Screenings   Food Insecurity: No Food Insecurity (09/05/2023)  Housing: Low Risk  (09/05/2023)  Transportation Needs: No Transportation Needs (09/05/2023)  Utilities: Not At Risk (09/05/2023)  Alcohol  Screen: Low Risk  (12/31/2019)  Depression (PHQ2-9): Low Risk  (08/19/2023)  Financial Resource Strain: Low Risk  (12/31/2019)  Physical Activity: Inactive (12/31/2019)  Social Connections: Moderately Isolated (09/05/2023)  Stress: No Stress Concern Present (12/31/2019)  Tobacco Use: Medium Risk (09/06/2023)     Readmission Risk Interventions    09/07/2023    8:35 AM 07/05/2022    1:10 PM 07/05/2022    1:09 PM  Readmission Risk Prevention Plan  Transportation Screening Complete  Complete  PCP or Specialist Appt within 3-5 Days  Complete   Home Care Screening Complete    Medication Review (RN CM) Complete    HRI or Home Care Consult  Complete   Social Work Consult for Recovery Care Planning/Counseling  Complete   Palliative Care Screening  Not Applicable   Medication Review Oceanographer)  Complete

## 2023-09-13 NOTE — Progress Notes (Signed)
   Palliative Medicine Inpatient Follow Up Note HPI: 89/F w chronic diastolic dysfunction CHF, pulmonary embolism, HTN, small B-cell lymphoma of the spleen/non-Hodgkin's lymphoma, PAD, essential thrombocytosis, JAK2 plus myelo proliferative disorder, MGUS and GERD Presented to [facility] ED with complaints of chest discomfort increasing shortness of breath and nausea for the last couple days.   Today's Discussion 09/13/2023  *Please note that this is a verbal dictation therefore any spelling or grammatical errors are due to the Dragon Medical One system interpretation.  Chart reviewed inclusive of vital signs, progress notes, laboratory results, and diagnostic images.   I met with [name], her son Lynwood, and her granddaughter, Rolin at bedside this morning in the company of the advanced heart failure team, Beckey Coe & Dr. Zenaida.  We discussed patient(s) stability thus far off of milrinone . This morning she has formally been weaned off completely.   It was explained that time will be the predictor regarding how well Leslie Williamson may or may not do. If she improves then the hope is to get her home and if not then to continue hospice conversation(s).   I was able to speak to family after the advanced heart failure team left. We discussed [name]'s fragile condition in the setting of her advanced heart failure. We reviewed the natural progression of the disease even with good medical management.   We reviewed the role of hospice care as well as Palliative support. If [name] maintains stability we reviewed [facility] Palliative following along with her once she gets home.   [name] denies pain, shortness of breath, or nausea this morning.   Patients family hopeful that [name] can get home in the oncoming day(s) if she remains stable.   Questions and concerns addressed/Palliative Support Provided.   Objective Assessment: Vital Signs Vitals:   09/13/23 0844 09/13/23 1058  BP: (!) 96/59 90/63  Pulse:  (!) 112 (!) 105  Resp: 18 19  Temp: 98 F (36.7 C) 98 F (36.7 C)  SpO2: 95% 98%    Intake/Output Summary (Last 24 hours) at 09/13/2023 1137 Last data filed at 09/13/2023 1001 Gross per 24 hour  Intake 536.3 ml  Output 400 ml  Net 136.3 ml   Last Weight  Most recent update: 09/13/2023  5:50 AM    Weight  53.3 kg (117 lb 8.1 oz)            Gen:  Elderly AA F chronically ill appearing HEENT: moist mucous membranes CV: Regular rate and rhythm  PULM:  On RA, breathing is even and nonlabored ABD: soft/nontender  EXT: (+) muscle wasting, trace BLE edema  Neuro: Alert and oriented x3   SUMMARY OF RECOMMENDATIONS   DNAR/DNI  Milrinone  weaned off today - will wait to see how she tolerates this  Discussed the option(s) of palliative care versus hospice care - reviewed hospice as the most reasonable option if she declines off milrinone   PMT will remains involved and supportive of family decisions ______________________________________________________________________________________ Rosaline Becton [place] Palliative Medicine Team Team Cell Phone: [phone] Please utilize secure chat with additional questions, if there is no response within 30 minutes please call the above phone number  Time Spent: 35 Billing based on MDM: Moderate  Palliative Medicine Team providers are available by phone from 7am to 7pm daily and can be reached through the team cell phone.  Should this patient require assistance outside of these hours, please call the patient's attending physician.

## 2023-09-13 NOTE — Progress Notes (Addendum)
 Advanced Heart Failure Rounding Note  Cardiologist: None   Chief Complaint: Acute on chronic CHF w/ new biventricular failure  Subjective:    CO-OX 46% this am on 0.125 milrinone .  No diuretics yesterday.   Resting comfortably in bed. Family at bedside. Denies CP/SOB. Has been ambulating in the afternoons per patient report.   Objective:   Weight Range: 53.3 kg Body mass index is 22.95 kg/m.   Vital Signs:   Temp:  [97.8 F (36.6 C)-98.3 F (36.8 C)] 98.2 F (36.8 C) (07/22 0543) Pulse Rate:  [106-118] 106 (07/22 0543) Resp:  [16-24] 18 (07/22 0543) BP: (91-106)/(54-70) 101/66 (07/22 0543) SpO2:  [93 %-97 %] 93 % (07/22 0543) Weight:  [53.3 kg] 53.3 kg (07/22 0543) Last BM Date : 09/11/23  Weight change: Filed Weights   09/11/23 0500 09/12/23 0500 09/13/23 0543  Weight: 53.1 kg 53.6 kg 53.3 kg    Intake/Output:   Intake/Output Summary (Last 24 hours) at 09/13/2023 0752 Last data filed at 09/12/2023 2105 Gross per 24 hour  Intake 533.3 ml  Output 400 ml  Net 133.3 ml     Physical Exam  General:  elderly appearing.   Neck: JVD ~7 cm.  Cor: Tachy/regular rate & rhythm. No murmurs. Lungs: clear Extremities: no edema  Neuro: alert & oriented x 3. Moves all 4 extremities w/o difficulty. Affect pleasant.  Telemetry   ST 110s (Personally reviewed)    Labs    CBC Recent Labs    09/11/23 0200 09/12/23 0310  WBC 5.8 5.5  HGB 8.0* 9.0*  HCT 25.0* 28.0*  MCV 72.3* 71.8*  PLT 330 399   Basic Metabolic Panel Recent Labs    92/79/74 0200 09/12/23 0310 09/13/23 0530  NA 137 138 135  K 3.6 3.4* 4.0  CL 103 102 103  CO2 24 24 26   GLUCOSE 111* 138* 106*  BUN 21 23 22   CREATININE 1.37* 1.43* 1.43*  CALCIUM  8.3* 8.8* 8.8*  MG 2.3  --   --    Liver Function Tests Recent Labs    09/11/23 0200  AST 17  ALT 39  ALKPHOS 90  BILITOT 0.8  PROT 4.4*  ALBUMIN 2.8*   No results for input(s): LIPASE, AMYLASE in the last 72 hours. Cardiac  Enzymes No results for input(s): CKTOTAL, CKMB, CKMBINDEX, TROPONINI in the last 72 hours.  BNP: BNP (last 3 results) Recent Labs    09/05/23 1410 09/07/23 0420  BNP >4,500.0* >4,500.0*    ProBNP (last 3 results) No results for input(s): PROBNP in the last 8760 hours.   D-Dimer No results for input(s): DDIMER in the last 72 hours. Hemoglobin A1C No results for input(s): HGBA1C in the last 72 hours. Fasting Lipid Panel No results for input(s): CHOL, HDL, LDLCALC, TRIG, CHOLHDL, LDLDIRECT in the last 72 hours. Thyroid Function Tests No results for input(s): TSH, T4TOTAL, T3FREE, THYROIDAB in the last 72 hours.  Invalid input(s): FREET3  Other results:  Imaging   No results found.  Medications:   Scheduled Medications:  acyclovir   400 mg Oral BID   allopurinol   300 mg Oral Daily   apixaban   5 mg Oral BID   Chlorhexidine  Gluconate Cloth  6 each Topical Daily   digoxin   0.0625 mg Oral Daily   losartan   12.5 mg Oral QPM   sodium chloride  flush  3 mL Intravenous Q12H   spironolactone   25 mg Oral Daily    Infusions:  milrinone  0.125 mcg/kg/min (09/12/23 2105)  PRN Medications: acetaminophen  **OR** acetaminophen , bisacodyl , diphenhydrAMINE , guaiFENesin -dextromethorphan , ondansetron  **OR** ondansetron  (ZOFRAN ) IV, mouth rinse, polyethylene glycol, sodium chloride  flush, traZODone   Patient Profile   89 y.o. 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 Admitted with acute on chronic CHF with newly reduced EF w/ biventricular dysfunction and cardiogenic shock in setting of recent Adriamycin .   Assessment/Plan   Acute on chronic CHF with newly reduced EF and biventricular dysfunction/cardiogenic shock: -EF previously 45-50% -Echo 07/25: EF 20%, RV okay -CM most likely 2/2 Adriamycin . However, ECG with anterior Qs and hx chest  pain on presentation.  -cMRI w/ LVEF 22% and RVEF 28%, nonspecific LGE, moderate MR -CO-OX 46% on 0.125 milrinone . Plan to d/c milrinone  today - Volume okay - Increase spiro 12.5>25 mg daily - Continue losartan  12.5 mg daily - Continue digoxin  0.0625 mg daily -She is tenuous. Tachycardic with soft BP. Not sure if she will tolerate milrinone  wean. In that case, would then recommend more palliative approach. Discussed with granddaughter, Rolin, at bedside.  - Palliative Care consulted for GOC discussions. She is DNR.   2. B cell lymphoma - S/p RCHOP -PET from 06/25 with mild increase in hypermetabolic activity involving soft tissue mass in RLE  -Planning for radiation therapy in the future   3. AKI on CKD IIIa -Scr baseline around 1.1, Cr up to 1.9 on admit -Improved with inotrope support and diuresis   4. Elevated LFTs -Likely d/t passive congestion and cardiogenic shock -Improving.   5. Hx PE -On Eliquis   Length of Stay: 8  Beckey LITTIE Coe, NP  09/13/2023, 7:52 AM  Advanced Heart Failure Team Pager [phone] (M-F; 7a - 5p)  Please contact [facility] for night-coverage after hours (5p -7a ) and weekends on [email or web address]

## 2023-09-13 NOTE — Progress Notes (Signed)
 PROGRESS NOTE    NANCE MCCOMBS  FMW:[id number] DOB: 06/25/1931 DOA: 09/05/2023 PCP: Carlette Benita Area, MD  89/F w chronic diastolic dysfunction CHF, pulmonary embolism, HTN, small B-cell lymphoma of the spleen/non-Hodgkin's lymphoma, PAD, essential thrombocytosis, JAK2 plus myelo proliferative disorder, MGUS and GERD Presented to [facility] ED with complaints of chest discomfort increasing shortness of breath and nausea for the last couple days. - Workup in the ED noted bilateral pleural effusions, BNP more than 4500, lactic acidosis, elevated creatinine - Echo noted EF of 20%, normal RV - Seen by palliative care, cardiology, started on IV milrinone  and transferred to [facility], arrived after midnight 7/18 - CHF team following, not a candidate for advanced therapies - 7/21 started milrinone  wean  Subjective: - Feels okay, no events overnight  Assessment and Plan:  Acute systolic CHF Low output failure -2D echo noted EF down to 20%, normal RV, suspected to be secondary to Adriamycin  -Complicated by lactic acidosis, AKI, after discussion with oncology and palliative care she was started on IV milrinone  and transferred to [facility] - Volume status improving, conservative management - CHF team following, started on digoxin , Aldactone , mains tachycardic on milrinone  with soft blood pressures, not a candidate for advanced therapies, palliative care following - Cardiac MRI with low RV and LVEF - Poor candidate for home milrinone  therapy, weaned off milrinone  - Discussed with son at bedside, recommended consideration of hospice services at discharge, palliative team following, anticipate slow decline off milrinone    AKI -- cardiorenal, stable   Transaminitis -- Second to passive hepatic congestion from CHF, improving   H/o PE -- remains on apixaban     Multiple Myeloma  Non-Hodgkins Lymphoma  -- she is followed by Dr. Rogers -- completed 6 cycles of R mini CHOP -- My partner Dr.Johnson  discussed with oncologist Dr MARLA who knows patient well, he felt  her cancers are well controlled now, myeloma has been quiescent, says she likely developed heart failure from adriamycin  therapy which is now completed and now to complete remainder of lymphoma treatment with radiation therapy to the leg   Chronic anemia -Likely multifactorial, overall improving, monitor   DVT prophylaxis: apixaban   Code Status: DNR  Communication: Son at bedside Disposition Plan: Likely in 1 to 2 days, with hospice  Consultants: CHF team   Procedures:   Antimicrobials:    Objective: Vitals:   09/13/23 0100 09/13/23 0543 09/13/23 0844 09/13/23 1058  BP: (!) 98/54 101/66 (!) 96/59 90/63  Pulse:  (!) 106 (!) 112 (!) 105  Resp: 16 18 18 19   Temp: 98.3 F (36.8 C) 98.2 F (36.8 C) 98 F (36.7 C) 98 F (36.7 C)  TempSrc: Oral Oral Oral Oral  SpO2: 97% 93% 95% 98%  Weight:  53.3 kg    Height:        Intake/Output Summary (Last 24 hours) at 09/13/2023 1148 Last data filed at 09/13/2023 1001 Gross per 24 hour  Intake 536.3 ml  Output 400 ml  Net 136.3 ml    Filed Weights   09/11/23 0500 09/12/23 0500 09/13/23 0543  Weight: 53.1 kg 53.6 kg 53.3 kg    Examination:  General exam: Chronically ill elderly, AAO x 3, pleasant, no distress HEENT: No JVD, Port-A-Cath noted CVS: S1-S2, regular rhythm Lungs: Clear anteriorly, decreased at bases Abdomen: Soft, nontender, bowel sounds present Remedies: Trace edema Skin: No rashes Psychiatry: Flat affect    Data Reviewed:   CBC: Recent Labs  Lab 09/08/23 0405 09/09/23 0430 09/10/23 0500 09/11/23 0200 09/12/23  0310  WBC 5.0 6.8 5.1 5.8 5.5  HGB 8.0* 9.3* 8.7* 8.0* 9.0*  HCT 25.6* 29.2* 27.3* 25.0* 28.0*  MCV 73.8* 72.6* 72.8* 72.3* 71.8*  PLT 331 373 371 330 399   Basic Metabolic Panel: Recent Labs  Lab 09/08/23 0405 09/09/23 0430 09/09/23 0730 09/10/23 0500 09/11/23 0200 09/12/23 0310 09/13/23 0530  NA 143 141 138 143 137  138 135  K 3.9 3.8 3.8 3.4* 3.6 3.4* 4.0  CL 113* 107 104 106 103 102 103  CO2 22 24 22 26 24 24 26   GLUCOSE 101* 127* 125* 121* 111* 138* 106*  BUN 32* 24* 24* 19 21 23 22   CREATININE 1.27* 1.15* 1.28* 1.25* 1.37* 1.43* 1.43*  CALCIUM  8.3* 8.9 8.9 8.6* 8.3* 8.8* 8.8*  MG 1.7 1.7  --  1.7 2.3  --   --    GFR: Estimated Creatinine Clearance: 18.8 mL/min (A) (by C-G formula based on SCr of 1.43 mg/dL (H)). Liver Function Tests: Recent Labs  Lab 09/08/23 0405 09/09/23 0430 09/09/23 0730 09/10/23 0500 09/11/23 0200  AST 31 26 27 19 17   ALT 85* 74* 73* 51* 39  ALKPHOS 120 110 112 101 90  BILITOT 0.9 1.1 0.9 1.0 0.8  PROT 4.4* 5.0* 5.1* 4.7* 4.4*  ALBUMIN 2.7* 3.1* 3.1* 2.9* 2.8*   No results for input(s): LIPASE, AMYLASE in the last 168 hours. No results for input(s): AMMONIA in the last 168 hours. Coagulation Profile: No results for input(s): INR, PROTIME in the last 168 hours. Cardiac Enzymes: No results for input(s): CKTOTAL, CKMB, CKMBINDEX, TROPONINI in the last 168 hours. BNP (last 3 results) No results for input(s): PROBNP in the last 8760 hours. HbA1C: No results for input(s): HGBA1C in the last 72 hours. CBG: No results for input(s): GLUCAP in the last 168 hours. Lipid Profile: No results for input(s): CHOL, HDL, LDLCALC, TRIG, CHOLHDL, LDLDIRECT in the last 72 hours. Thyroid Function Tests: No results for input(s): TSH, T4TOTAL, FREET4, T3FREE, THYROIDAB in the last 72 hours. Anemia Panel: No results for input(s): VITAMINB12, FOLATE, FERRITIN, TIBC, IRON , RETICCTPCT in the last 72 hours. Urine analysis:    Component Value Date/Time   COLORURINE YELLOW 09/06/2023 1400   APPEARANCEUR CLEAR 09/06/2023 1400   LABSPEC 1.010 09/06/2023 1400   PHURINE 5.0 09/06/2023 1400   GLUCOSEU 150 (A) 09/06/2023 1400   HGBUR NEGATIVE 09/06/2023 1400   HGBUR large 06/19/2007 0845   BILIRUBINUR NEGATIVE 09/06/2023 1400    BILIRUBINUR negative 08/05/2021 1334   KETONESUR NEGATIVE 09/06/2023 1400   PROTEINUR NEGATIVE 09/06/2023 1400   UROBILINOGEN 0.2 08/05/2021 1334   UROBILINOGEN 0.2 07/03/2012 1929   NITRITE NEGATIVE 09/06/2023 1400   LEUKOCYTESUR NEGATIVE 09/06/2023 1400   Sepsis Labs: @LABRCNTIP (procalcitonin:4,lacticidven:4)  ) Recent Results (from the past 240 hours)  Resp panel by RT-PCR (RSV, Flu A&B, Covid) Anterior Nasal Swab     Status: None   Collection Time: 09/05/23  4:33 PM   Specimen: Anterior Nasal Swab  Result Value Ref Range Status   SARS Coronavirus 2 by RT PCR NEGATIVE NEGATIVE Final    Comment: (NOTE) SARS-CoV-2 target nucleic acids are NOT DETECTED.  The SARS-CoV-2 RNA is generally detectable in upper respiratory specimens during the acute phase of infection. The lowest concentration of SARS-CoV-2 viral copies this assay can detect is 138 copies/mL. A negative result does not preclude SARS-Cov-2 infection and should not be used as the sole basis for treatment or other patient management decisions. A negative result may occur with  improper specimen collection/handling, submission of specimen other than nasopharyngeal swab, presence of viral mutation(s) within the areas targeted by this assay, and inadequate number of viral copies(<138 copies/mL). A negative result must be combined with clinical observations, patient history, and epidemiological information. The expected result is Negative.  Fact Sheet for Patients:  BloggerCourse.com  Fact Sheet for Healthcare Providers:  SeriousBroker.it  This test is no t yet approved or cleared by the [place]  FDA and  has been authorized for detection and/or diagnosis of SARS-CoV-2 by FDA under an Emergency Use Authorization (EUA). This EUA will remain  in effect (meaning this test can be used) for the duration of the COVID-19 declaration under Section 564(b)(1) of the Act,  21 U.S.C.section 360bbb-3(b)(1), unless the authorization is terminated  or revoked sooner.       Influenza A by PCR NEGATIVE NEGATIVE Final   Influenza B by PCR NEGATIVE NEGATIVE Final    Comment: (NOTE) The Xpert Xpress SARS-CoV-2/FLU/RSV plus assay is intended as an aid in the diagnosis of influenza from Nasopharyngeal swab specimens and should not be used as a sole basis for treatment. Nasal washings and aspirates are unacceptable for Xpert Xpress SARS-CoV-2/FLU/RSV testing.  Fact Sheet for Patients: BloggerCourse.com  Fact Sheet for Healthcare Providers: SeriousBroker.it  This test is not yet approved or cleared by the [place]  FDA and has been authorized for detection and/or diagnosis of SARS-CoV-2 by FDA under an Emergency Use Authorization (EUA). This EUA will remain in effect (meaning this test can be used) for the duration of the COVID-19 declaration under Section 564(b)(1) of the Act, 21 U.S.C. section 360bbb-3(b)(1), unless the authorization is terminated or revoked.     Resp Syncytial Virus by PCR NEGATIVE NEGATIVE Final    Comment: (NOTE) Fact Sheet for Patients: BloggerCourse.com  Fact Sheet for Healthcare Providers: SeriousBroker.it  This test is not yet approved or cleared by the [place]  FDA and has been authorized for detection and/or diagnosis of SARS-CoV-2 by FDA under an Emergency Use Authorization (EUA). This EUA will remain in effect (meaning this test can be used) for the duration of the COVID-19 declaration under Section 564(b)(1) of the Act, 21 U.S.C. section 360bbb-3(b)(1), unless the authorization is terminated or revoked.  Performed at [facility], [address] [place], [place] [address]   MRSA Next Gen by PCR, Nasal     Status: None   Collection Time: 09/06/23  5:45 PM   Specimen: Nasal Mucosa; Nasal Swab  Result Value Ref  Range Status   MRSA by PCR Next Gen NOT DETECTED NOT DETECTED Final    Comment: (NOTE) The GeneXpert MRSA Assay (FDA approved for NASAL specimens only), is one component of a comprehensive MRSA colonization surveillance program. It is not intended to diagnose MRSA infection nor to guide or monitor treatment for MRSA infections. Test performance is not FDA approved in patients less than 46 years old. Performed at [facility], [address] [place], [place] [address]      Radiology Studies: No results found.    Scheduled Meds:  acyclovir   400 mg Oral BID   allopurinol   300 mg Oral Daily   apixaban   5 mg Oral BID   Chlorhexidine  Gluconate Cloth  6 each Topical Daily   digoxin   0.0625 mg Oral Daily   losartan   12.5 mg Oral QPM   sodium chloride  flush  3 mL Intravenous Q12H   spironolactone   25 mg Oral Daily   Continuous Infusions:     LOS: 8  days    Time spent:    Sigurd Pac, MD [facility]   09/13/2023, 11:48 AM

## 2023-09-13 NOTE — Progress Notes (Signed)
 Mobility Specialist Progress Note:    09/13/23 1127  Mobility  Activity Ambulated with assistance in hallway  Level of Assistance Contact guard assist, steadying assist  Assistive Device None  Distance Ambulated (ft) 150 ft  Activity Response Tolerated fair  Mobility Referral Yes  Mobility visit 1 Mobility  Mobility Specialist Start Time (ACUTE ONLY) 1127  Mobility Specialist Stop Time (ACUTE ONLY) 1140  Mobility Specialist Time Calculation (min) (ACUTE ONLY) 13 min   Pt agreeable to session. Seemed a little off balance but still wanted to walk. Throughout session, pt c/o dizziness and somewhat feeling jittery/nervous. Pt balance was off throughout ambulating requiring HHA. Returned pt to recliner where BP was taken. Notified RN. Pt left in room w/ family and NT Post Mobility: Sitting BP 97/63 (74)  Venetia Keel Mobility Specialist Please Neurosurgeon or Rehab Office at [phone]

## 2023-09-13 NOTE — Plan of Care (Signed)
  Problem: Clinical Measurements: Goal: Ability to maintain clinical measurements within normal limits will improve Outcome: Progressing Goal: Cardiovascular complication will be avoided Outcome: Progressing   Problem: Nutrition: Goal: Adequate nutrition will be maintained Outcome: Progressing   Problem: Pain Managment: Goal: General experience of comfort will improve and/or be controlled Outcome: Progressing

## 2023-09-14 ENCOUNTER — Encounter: Payer: Self-pay | Admitting: Hematology

## 2023-09-14 ENCOUNTER — Other Ambulatory Visit (HOSPITAL_COMMUNITY): Payer: Self-pay

## 2023-09-14 DIAGNOSIS — I5023 Acute on chronic systolic (congestive) heart failure: Secondary | ICD-10-CM | POA: Diagnosis not present

## 2023-09-14 DIAGNOSIS — Z515 Encounter for palliative care: Secondary | ICD-10-CM | POA: Diagnosis not present

## 2023-09-14 LAB — BASIC METABOLIC PANEL WITH GFR
Anion gap: 10 (ref 5–15)
BUN: 25 mg/dL — ABNORMAL HIGH (ref 8–23)
CO2: 23 mmol/L (ref 22–32)
Calcium: 9.1 mg/dL (ref 8.9–10.3)
Chloride: 103 mmol/L (ref 98–111)
Creatinine, Ser: 1.38 mg/dL — ABNORMAL HIGH (ref 0.44–1.00)
GFR, Estimated: 36 mL/min — ABNORMAL LOW (ref >60)
Glucose, Bld: 119 mg/dL — ABNORMAL HIGH (ref 70–99)
Potassium: 4.4 mmol/L (ref 3.5–5.1)
Sodium: 136 mmol/L (ref 135–145)

## 2023-09-14 LAB — COOXEMETRY PANEL
Carboxyhemoglobin: 1.2 % (ref 0.5–1.5)
Methemoglobin: <0.7 % (ref 0.0–1.5)
O2 Saturation: 34.9 %
Total hemoglobin: 8.8 g/dL — ABNORMAL LOW (ref 12.0–16.0)

## 2023-09-14 MED ORDER — LOSARTAN POTASSIUM 25 MG PO TABS
12.5000 mg | ORAL_TABLET | Freq: Every evening | ORAL | 1 refills | Status: DC
  Filled 2023-09-14: qty 30, 60d supply, fill #0

## 2023-09-14 MED ORDER — HEPARIN SOD (PORK) LOCK FLUSH 100 UNIT/ML IV SOLN
500.0000 [IU] | INTRAVENOUS | Status: AC | PRN
  Administered 2023-09-14: 500 [IU]

## 2023-09-14 MED ORDER — APIXABAN 2.5 MG PO TABS
2.5000 mg | ORAL_TABLET | Freq: Two times a day (BID) | ORAL | Status: DC
Start: 2023-09-14 — End: 2023-09-14

## 2023-09-14 MED ORDER — SPIRONOLACTONE 25 MG PO TABS
25.0000 mg | ORAL_TABLET | Freq: Every day | ORAL | 1 refills | Status: DC
  Filled 2023-09-14: qty 30, 30d supply, fill #0

## 2023-09-14 MED ORDER — FUROSEMIDE 40 MG PO TABS: 20.0000 mg | ORAL_TABLET | Freq: Every day | ORAL | Status: DC

## 2023-09-14 MED ORDER — DIGOXIN 125 MCG PO TABS
0.0625 mg | ORAL_TABLET | Freq: Every day | ORAL | 0 refills | Status: DC
  Filled 2023-09-14: qty 15, 30d supply, fill #0

## 2023-09-14 MED ORDER — APIXABAN 5 MG PO TABS: 5.0000 mg | ORAL_TABLET | Freq: Two times a day (BID) | ORAL | Status: DC

## 2023-09-14 NOTE — Progress Notes (Signed)
 AVS and education completed with son.  Son given time to ask questions.  Port previously deactivated prior to discharge teaching.  Guaze dressing intact.  Nurse notified that LDA needs to be updated.  Per son, DME  and HH arranged.  Volunteer transporting to [employer] and main entrance.

## 2023-09-14 NOTE — TOC Transition Note (Addendum)
 Transition of Care [facility]) - Discharge Note   Patient Details  Name: Leslie Williamson MRN: [id number] Date of Birth: 1931-06-22  Transition of Care [facility]) CM/SW Contact:  Waddell Barnie Rama, RN Phone Number: 09/14/2023, 11:35 AM   Clinical Narrative:    For dc , the DME has been delivered , transport at the bedside.  NCM notified Kwante with [facility].     Barriers to Discharge: Continued Medical Work up   Patient Goals and CMS Choice Patient states their goals for this hospitalization and ongoing recovery are:: wants to remain independent CMS Medicare.gov Compare Post Acute Care list provided to:: Patient Represenative (must comment) Choice offered to / list presented to : Adult Children [place] ownership interest in [facility].provided to::  (n/a)    Discharge Placement                       Discharge Plan and Services Additional resources added to the After Visit Summary for   In-house Referral: Clinical Social Work Discharge Planning Services: CM Consult Post Acute Care Choice: Hospice          DME Arranged: 3-N-1 DME Agency: [facility] Date DME Agency Contacted: 09/09/23 Time DME Agency Contacted: [phone] Representative spoke with at DME Agency: [facility] Arranged: RN HH Agency: [facility]) Date [facility] Agency Contacted: 09/13/23 Time HH Agency Contacted: [phone] Representative spoke with at [facility] Agency: Darleene Gowda  Social Drivers of Health (SDOH) Interventions SDOH Screenings   Food Insecurity: No Food Insecurity (09/05/2023)  Housing: Low Risk  (09/05/2023)  Transportation Needs: No Transportation Needs (09/05/2023)  Utilities: Not At Risk (09/05/2023)  Alcohol  Screen: Low Risk  (12/31/2019)  Depression (PHQ2-9): Low Risk  (08/19/2023)  Financial Resource Strain: Low Risk  (12/31/2019)  Physical Activity: Inactive (12/31/2019)  Social Connections: Moderately Isolated (09/05/2023)  Stress: No Stress Concern  Present (12/31/2019)  Tobacco Use: Medium Risk (09/06/2023)     Readmission Risk Interventions    09/07/2023    8:35 AM 07/05/2022    1:10 PM 07/05/2022    1:09 PM  Readmission Risk Prevention Plan  Transportation Screening Complete  Complete  PCP or Specialist Appt within 3-5 Days  Complete   Home Care Screening Complete    Medication Review (RN CM) Complete    HRI or Home Care Consult  Complete   Social Work Consult for Recovery Care Planning/Counseling  Complete   Palliative Care Screening  Not Applicable   Medication Review Oceanographer)  Complete

## 2023-09-14 NOTE — Progress Notes (Addendum)
   Palliative Medicine Inpatient Follow Up Note HPI: 89/F w chronic diastolic dysfunction CHF, pulmonary embolism, HTN, small B-cell lymphoma of the spleen/non-Hodgkin's lymphoma, PAD, essential thrombocytosis, JAK2 plus myelo proliferative disorder, MGUS and GERD Presented to [facility] ED with complaints of chest discomfort increasing shortness of breath and nausea for the last couple days.   Today's Discussion 09/14/2023  *Please note that this is a verbal dictation therefore any spelling or grammatical errors are due to the Dragon Medical One system interpretation.  Chart reviewed inclusive of vital signs, progress notes, laboratory results, and diagnostic images.   I met with Leslie Williamson this morning. She is joined by her son. We discussed the plan moving forward inclusive of discharge to home with [facility] hospice care.  We briefly reviewed the anticipation from a medication perspective and how symptom control is optimized.  Questions and concerns addressed/Palliative Support Provided.   Objective Assessment: Vital Signs Vitals:   09/14/23 0532 09/14/23 0725  BP: 94/68 105/68  Pulse: 100 (!) 114  Resp: 20 (!) 25  Temp: 98.3 F (36.8 C) 97.7 F (36.5 C)  SpO2: 96% 99%    Intake/Output Summary (Last 24 hours) at 09/14/2023 1029 Last data filed at 09/14/2023 9051 Gross per 24 hour  Intake 123 ml  Output --  Net 123 ml   Last Weight  Most recent update: 09/14/2023  5:35 AM    Weight  59.1 kg (130 lb 4.7 oz)            Gen:  Elderly AA F chronically ill appearing HEENT: moist mucous membranes CV: Regular rate and rhythm  PULM:  On RA, breathing is even and nonlabored ABD: soft/nontender  EXT: (+) muscle wasting, trace BLE edema  Neuro: Alert and oriented x3   SUMMARY OF RECOMMENDATIONS   DNAR/DNI  Plan for Leslie Williamson to transition home with hospice today ______________________________________________________________________________________ Leslie Williamson [facility]  Palliative Medicine Team Team Cell Phone: [phone] Please utilize secure chat with additional questions, if there is no response within 30 minutes please call the above phone number  Time Spent: 25  Palliative Medicine Team providers are available by phone from 7am to 7pm daily and can be reached through the team cell phone.  Should this patient require assistance outside of these hours, please call the patient's attending physician.

## 2023-09-14 NOTE — Progress Notes (Signed)
 PT Cancellation Note  Patient Details Name: ANISTYN GRADDY MRN: [id number] DOB: 1932/01/22   Cancelled Treatment:    Reason Eval/Treat Not Completed: (P) Other (comment) (Pt declined PT services. Family at bedside and report they have a plan for entry into home.  Will defer at this time.  Plan for d/c home today with support from her family.)   Husna Krone J Shota Kohrs 09/14/2023, 10:49 AM  Toya HAMS , PTA Acute Rehabilitation Services Office [phone]

## 2023-09-14 NOTE — Discharge Summary (Signed)
 Physician Discharge Summary  Leslie Williamson FMW:[id number] DOB: 1931/04/24 DOA: 09/05/2023  PCP: Carlette Benita Area, MD  Admit date: 09/05/2023 Discharge date: 09/14/2023  Time spent: 45 minutes  Recommendations for Outpatient Follow-up:  Home with hospice for symptom and comfort focused.   Discharge Diagnoses:  Principal Problem: Acute systolic CHF Low output heart failure, cardiogenic shock   JAK2+ MDP (myeloproliferative disorder) presenting with thrombocytosis (HCC)   PVD- know Lt SFA disease, bilat PT and AT disease   GERD   NHL (non-Hodgkin's lymphoma) (HCC)   Essential thrombocytosis (HCC)   MGUS (monoclonal gammopathy of unknown significance)   Multiple myeloma without remission (HCC)   HFrEF   Malignant neoplasm of lymphoid, hematopoietic and related tissue, unspecified (HCC)   Anthracycline-induced dilated cardiomyopathy (HCC)   Acute on chronic systolic heart failure (HCC)   Discharge Condition: Guarded  Diet recommendation: Comfort feeds  Filed Weights   09/12/23 0500 09/13/23 0543 09/14/23 0532  Weight: 53.6 kg 53.3 kg 59.1 kg    History of present illness:  89/F w chronic diastolic dysfunction CHF, pulmonary embolism, HTN, small B-cell lymphoma of the spleen/non-Hodgkin's lymphoma, PAD, essential thrombocytosis, JAK2 plus myelo proliferative disorder, MGUS and GERD Presented to [facility] ED with complaints of chest discomfort increasing shortness of breath and nausea for the last couple days. - Workup in the ED noted bilateral pleural effusions, BNP more than 4500, lactic acidosis, elevated creatinine - Echo noted EF of 20%, normal RV - Seen by palliative care, cardiology, started on IV milrinone  and transferred to [facility], arrived after midnight 7/18 - CHF team following, not a candidate for advanced therapies - 7/21 started milrinone  wean  Hospital Course:   Acute systolic CHF Low output failure -2D echo noted EF down to 20%, normal RV, suspected to be  secondary to Adriamycin  -Complicated by lactic acidosis, AKI, after discussion with oncology and palliative care she was started on IV milrinone  and transferred to [facility] - Volume status improving, conservative management - CHF team following, started on digoxin , Aldactone , mains tachycardic on milrinone  with soft blood pressures, not a candidate for advanced therapies, palliative care following - Cardiac MRI with low RV and low LVEF - Poor candidate for home milrinone  therapy, weaned off milrinone  yesterday - co-ox  is down to 34 today, she is not symptomatic today however anticipate she will continue to decline over the next few days, weeks off inotropes.  Seen by palliative care this admission. -Discussed with family at bedside, they are agreeable to discharge home with hospice services for symptom focused care   AKI -- cardiorenal, stable   Transaminitis -- Second to passive hepatic congestion from CHF, improving   H/o PE -- remains on apixaban     Multiple Myeloma  Non-Hodgkins Lymphoma  -- she is followed by Dr. Rogers -- completed 6 cycles of R mini CHOP -- My partner Dr.Johnson discussed with oncologist Dr MARLA who knows patient well, he felt  her cancers are well controlled now, myeloma has been quiescent, says she likely developed heart failure from adriamycin  therapy which is now completed and now to complete remainder of lymphoma treatment with radiation therapy to the leg    Chronic anemia -Likely multifactorial, overall improving, monitor  Discharge Exam: Vitals:   09/14/23 0532 09/14/23 0725  BP: 94/68 105/68  Pulse: 100 (!) 114  Resp: 20 (!) 25  Temp: 98.3 F (36.8 C) 97.7 F (36.5 C)  SpO2: 96% 99%   Gen: Awake, Alert, Oriented X 3,  HEENT: +JVD Lungs: Good  air movement bilaterally, CTAB CVS: S1S2/RRR Abd: soft, Non tender, non distended, BS present Extremities: 1+ edema Skin: no new rashes on exposed skin   Discharge Instructions   Discharge  Instructions     Diet - low sodium heart healthy   Complete by: As directed    Increase activity slowly   Complete by: As directed       Allergies as of 09/14/2023       Reactions   Penicillins Rash        Medication List     STOP taking these medications    dapagliflozin  propanediol 10 MG Tabs tablet Commonly known as: FARXIGA    lenalidomide  10 MG capsule Commonly known as: REVLIMID    metoprolol  succinate 50 MG 24 hr tablet Commonly known as: TOPROL -XL   potassium chloride  SA 20 MEQ tablet Commonly known as: KLOR-CON  M       TAKE these medications    acyclovir  400 MG tablet Commonly known as: ZOVIRAX  Take 1 tablet (400 mg total) by mouth 2 (two) times daily.   atorvastatin  40 MG tablet Commonly known as: LIPITOR Take 40 mg by mouth at bedtime.   Digoxin  62.5 MCG Tabs Take 0.0625 mg by mouth daily. Start taking on: September 15, 2023   Eliquis  5 MG Tabs tablet Generic drug: apixaban  Take 1 tablet (5 mg total) by mouth 2 (two) times daily.   furosemide  40 MG tablet Commonly known as: LASIX  Take 0.5 tablets (20 mg total) by mouth daily. What changed: how much to take   losartan  25 MG tablet Commonly known as: COZAAR  Take 0.5 tablets (12.5 mg total) by mouth every evening.   promethazine-dextromethorphan  6.25-15 MG/5ML syrup Commonly known as: PROMETHAZINE-DM Take 5 mLs by mouth 4 (four) times daily as needed for cough.   spironolactone  25 MG tablet Commonly known as: ALDACTONE  Take 1 tablet (25 mg total) by mouth daily. Start taking on: September 15, 2023               Durable Medical Equipment  (From admission, onward)           Start     Ordered   09/09/23 1415  For home use only DME 3 n 1  Once        09/09/23 1414           Allergies  Allergen Reactions   Penicillins Rash    Follow-up Information     Carlette Benita Area, MD Follow up.   Specialty: Internal Medicine Why: please call PCP for follow up appt in 7-14  days Contact information: [address] [place] [address] [phone]         [facility] Follow up.   Specialty: Home Health Services Why: Home Health will call to arrange visit Contact information: [address] [place] [address] [phone]         [facility] Follow up on 09/28/2023.   Specialty: Cardiology Why: Follow up in the Advanced Heart Failure clinic 8/6 @ 11 Contact information: [address] [place]  [phone]                 The results of significant diagnostics from this hospitalization (including imaging, microbiology, ancillary and laboratory) are listed below for reference.    Significant Diagnostic Studies: MR CARDIAC MORPHOLOGY W WO CONTRAST Result Date: 09/09/2023 CLINICAL DATA:  44F with multiple myeloma, lymphoma, PE, CKD p/w cardiogenic shock.  Echo with EF 20%, normal RV function EXAM: CARDIAC MRI TECHNIQUE: The patient was scanned on a 1.5 Tesla Siemens magnet. A dedicated cardiac coil was used. Functional imaging was done using Fiesta sequences. 2,3, and 4 chamber views were done to assess for RWMA's. Modified Simpson's rule using a short axis stack was used to calculate an ejection fraction on a dedicated work [employer], [employer]. The patient received 10 cc of Gadavist . After 10 minutes inversion recovery sequences were used to assess for infiltration and scar tissue. Phase contrast velocity mapping was performed above the aortic and pulmonic valves CONTRAST:  10 cc  of Gadavist  FINDINGS: Left ventricle: -Mild dilatation -Severe systolic dysfunction -Elevated ECV (38%) -RV insertion site LGE LV EF:  22% (Normal 52-79%) Absolute volumes: LV EDV: (Normal 78-167 mL) LV ESV: (Normal 21-64 mL) LV SV: 37mL (Normal 52-114 mL) CO: 4.5L/min (Normal 2.7-6.3 L/min) Indexed volumes: LV EDV: 145mL/sq-m (Normal 50-96  mL/sq-m) LV ESV: 73mL/sq-m (Normal 10-40 mL/sq-m) LV SV: 49mL/sq-m (Normal 33-64 mL/sq-m) CI: 3.0L/min/sq-m (Normal 1.9-3.9 L/min/sq-m) Right ventricle: Mild dilatation with severe systolic dysfunction RV EF: 28% (Normal 52-80%) Absolute volumes: RV EDV: (Normal 79-175 mL) RV ESV: (Normal 13-75 mL) RV SV: 48mL (Normal 56-110 mL) CO: 5.8L/min (Normal 2.7-6 L/min) Indexed volumes: RV EDV: 119mL/sq-m (Normal 51-97 mL/sq-m) RV ESV: 49mL/sq-m (Normal 9-42 mL/sq-m) RV SV: 77mL/sq-m (Normal 35-61 mL/sq-m) CI: 3.9L/min/sq-m (Normal 1.8-3.8 L/min/sq-m) Left atrium: Moderate enlargement Right atrium: Mild enlargement Mitral valve: Moderate regurgitation (regurgitant fraction 35%) Aortic valve: Mild regurgitation Tricuspid valve: Moderate regurgitation Pulmonic valve: Trivial regurgitation Aorta: Normal proximal ascending aorta Pericardium: Small effusion IMPRESSION: 1.  Mild LV dilatation with severe systolic dysfunction (EF 22%) 2.  Mild RV dilatation with severe systolic dysfunction (EF 28%) 3. RV insertion site LGE, which is a nonspecific scar pattern often seen in setting of elevated pulmonary pressures 4.  Moderate mitral regurgitation (regurgitant fraction 35%) Electronically Signed   By: Lonni Nanas M.D.   On: 09/09/2023 18:24   MR CARDIAC VELOCITY FLOW MAP Result Date: 09/09/2023 CLINICAL DATA:  36F with multiple myeloma, lymphoma, PE, CKD p/w cardiogenic shock. Echo with EF 20%, normal RV function EXAM: CARDIAC MRI TECHNIQUE: The patient was scanned on a 1.5 Tesla Siemens magnet. A dedicated cardiac coil was used. Functional imaging was done using Fiesta sequences. 2,3, and 4 chamber views were done to assess for RWMA's. Modified Simpson's rule using a short axis stack was used to calculate an ejection fraction on a dedicated work [employer] officer, [employer]. The patient received 10 cc of Gadavist . After 10 minutes inversion recovery sequences were used to assess for infiltration and scar  tissue. Phase contrast velocity mapping was performed above the aortic and pulmonic valves CONTRAST:  10 cc  of Gadavist  FINDINGS: Left ventricle: -Mild dilatation -Severe systolic dysfunction -Elevated ECV (38%) -RV insertion site LGE LV EF:  22% (Normal 52-79%) Absolute volumes: LV EDV: (Normal 78-167 mL) LV ESV: (Normal 21-64 mL) LV SV: 37mL (Normal 52-114 mL) CO: 4.5L/min (Normal 2.7-6.3 L/min) Indexed volumes: LV EDV: 157mL/sq-m (Normal 50-96 mL/sq-m) LV ESV: 53mL/sq-m (Normal 10-40 mL/sq-m) LV SV: 48mL/sq-m (Normal 33-64 mL/sq-m) CI: 3.0L/min/sq-m (Normal 1.9-3.9 L/min/sq-m) Right ventricle: Mild dilatation with severe systolic dysfunction RV EF: 28% (Normal 52-80%) Absolute volumes: RV EDV: (Normal 79-175 mL) RV ESV: (Normal 13-75 mL) RV SV: 48mL (Normal 56-110 mL) CO: 5.8L/min (Normal 2.7-6 L/min) Indexed volumes: RV EDV: 134mL/sq-m (Normal 51-97 mL/sq-m) RV ESV: 27mL/sq-m (Normal 9-42 mL/sq-m) RV  SV: 75mL/sq-m (Normal 35-61 mL/sq-m) CI: 3.9L/min/sq-m (Normal 1.8-3.8 L/min/sq-m) Left atrium: Moderate enlargement Right atrium: Mild enlargement Mitral valve: Moderate regurgitation (regurgitant fraction 35%) Aortic valve: Mild regurgitation Tricuspid valve: Moderate regurgitation Pulmonic valve: Trivial regurgitation Aorta: Normal proximal ascending aorta Pericardium: Small effusion IMPRESSION: 1.  Mild LV dilatation with severe systolic dysfunction (EF 22%) 2.  Mild RV dilatation with severe systolic dysfunction (EF 28%) 3. RV insertion site LGE, which is a nonspecific scar pattern often seen in setting of elevated pulmonary pressures 4.  Moderate mitral regurgitation (regurgitant fraction 35%) Electronically Signed   By: Lonni Nanas M.D.   On: 09/09/2023 18:24   MR CARDIAC VELOCITY FLOW MAP Result Date: 09/09/2023 CLINICAL DATA:  58F with multiple myeloma, lymphoma, PE, CKD p/w cardiogenic shock. Echo with EF 20%, normal RV function EXAM: CARDIAC MRI TECHNIQUE: The patient  was scanned on a 1.5 Tesla Siemens magnet. A dedicated cardiac coil was used. Functional imaging was done using Fiesta sequences. 2,3, and 4 chamber views were done to assess for RWMA's. Modified Simpson's rule using a short axis stack was used to calculate an ejection fraction on a dedicated work [employer] officer, [employer]. The patient received 10 cc of Gadavist . After 10 minutes inversion recovery sequences were used to assess for infiltration and scar tissue. Phase contrast velocity mapping was performed above the aortic and pulmonic valves CONTRAST:  10 cc  of Gadavist  FINDINGS: Left ventricle: -Mild dilatation -Severe systolic dysfunction -Elevated ECV (38%) -RV insertion site LGE LV EF:  22% (Normal 52-79%) Absolute volumes: LV EDV: (Normal 78-167 mL) LV ESV: (Normal 21-64 mL) LV SV: 37mL (Normal 52-114 mL) CO: 4.5L/min (Normal 2.7-6.3 L/min) Indexed volumes: LV EDV: 170mL/sq-m (Normal 50-96 mL/sq-m) LV ESV: 31mL/sq-m (Normal 10-40 mL/sq-m) LV SV: 18mL/sq-m (Normal 33-64 mL/sq-m) CI: 3.0L/min/sq-m (Normal 1.9-3.9 L/min/sq-m) Right ventricle: Mild dilatation with severe systolic dysfunction RV EF: 28% (Normal 52-80%) Absolute volumes: RV EDV: (Normal 79-175 mL) RV ESV: (Normal 13-75 mL) RV SV: 48mL (Normal 56-110 mL) CO: 5.8L/min (Normal 2.7-6 L/min) Indexed volumes: RV EDV: 179mL/sq-m (Normal 51-97 mL/sq-m) RV ESV: 65mL/sq-m (Normal 9-42 mL/sq-m) RV SV: 68mL/sq-m (Normal 35-61 mL/sq-m) CI: 3.9L/min/sq-m (Normal 1.8-3.8 L/min/sq-m) Left atrium: Moderate enlargement Right atrium: Mild enlargement Mitral valve: Moderate regurgitation (regurgitant fraction 35%) Aortic valve: Mild regurgitation Tricuspid valve: Moderate regurgitation Pulmonic valve: Trivial regurgitation Aorta: Normal proximal ascending aorta Pericardium: Small effusion IMPRESSION: 1.  Mild LV dilatation with severe systolic dysfunction (EF 22%) 2.  Mild RV dilatation with severe systolic dysfunction (EF 28%) 3. RV  insertion site LGE, which is a nonspecific scar pattern often seen in setting of elevated pulmonary pressures 4.  Moderate mitral regurgitation (regurgitant fraction 35%) Electronically Signed   By: Lonni Nanas M.D.   On: 09/09/2023 18:24   ECHOCARDIOGRAM LIMITED Result Date: 09/06/2023    ECHOCARDIOGRAM LIMITED REPORT   Patient Name:   Leslie Williamson Date of Exam: 09/06/2023 Medical Rec #:  [id number]         Height:       65.0 in Accession #:    [id number]        Weight:       127.9 lb Date of Birth:  January 12, 1932         BSA:          1.636 m Patient Age:    89 years          BP:           128/89 mmHg  Patient Gender: F                 HR:           91 bpm. Exam Location:  [facility] Procedure: Limited Echo, Cardiac Doppler, Strain Analysis and Intracardiac            Opacification Agent (Both Spectral and Color Flow Doppler were            utilized during procedure). Indications:    Dyspnea R06.00  History:        Patient has prior history of Echocardiogram examinations, most                 recent 03/03/2023. CHF; Risk Factors:Hypertension and                 Dyslipidemia. Hx of COVID-19.  Sonographer:    Aida Pizza RCS Referring Phys: [id number] DORN FALCON BRANCH  Sonographer Comments: Global longitudinal strain was attempted. IMPRESSIONS  1. Left ventricular ejection fraction, by estimation, is 20%. The left ventricle has severely decreased function. The left ventricle demonstrates global hypokinesis. The average left ventricular global longitudinal strain is -4.2 %. The global longitudinal strain is abnormal.  2. Right ventricular systolic function is normal. The right ventricular size is normal.  3. The inferior vena cava is dilated in size with <50% respiratory variability, suggesting right atrial pressure of 15 mmHg. FINDINGS  Left Ventricle: Left ventricular ejection fraction, by estimation, is 20%. The left ventricle has severely decreased function. The left ventricle demonstrates global  hypokinesis. Definity  contrast agent was given IV to delineate the left ventricular endocardial borders. The average left ventricular global longitudinal strain is -4.2 %. Strain was performed and the global longitudinal strain is abnormal. The left ventricular internal cavity size was normal in size. There is no left ventricular hypertrophy. Right Ventricle: The right ventricular size is normal. Right vetricular wall thickness was not well visualized. Right ventricular systolic function is normal. Venous: The inferior vena cava is dilated in size with less than 50% respiratory variability, suggesting right atrial pressure of 15 mmHg. Additional Comments: 3D was performed not requiring image post processing on an independent workstation and was abnormal.  LEFT VENTRICLE PLAX 2D LVIDd:         5.00 cm   Diastology LVIDs:         4.60 cm   LV e' medial:    2.94 cm/s LV PW:         1.00 cm   LV E/e' medial:  28.6 LV IVS:        1.00 cm   LV e' lateral:   7.29 cm/s LVOT diam:     1.70 cm   LV E/e' lateral: 11.6 LVOT Area:     2.27 cm                          2D Longitudinal Strain                          2D Strain GLS Avg:     -4.2 %                           3D Volume EF:                          3D EF:  32 %                          LV EDV:       150 ml                          LV ESV:       101 ml                          LV SV:        49 ml RIGHT VENTRICLE RV S prime:     10.30 cm/s TAPSE (M-mode): 2.1 cm LEFT ATRIUM             Index        RIGHT ATRIUM           Index LA diam:        3.50 cm 2.14 cm/m   RA Area:     19.10 cm LA Vol (A2C):   73.0 ml 44.63 ml/m  RA Volume:   53.30 ml  32.59 ml/m LA Vol (A4C):   43.5 ml 26.59 ml/m LA Biplane Vol: 60.9 ml 37.23 ml/m   AORTA Ao Root diam: 3.30 cm MITRAL VALVE MV Area (PHT): 5.27 cm    SHUNTS MV Decel Time: 144 msec    Systemic Diam: 1.70 cm MV E velocity: 84.20 cm/s MV A velocity: 80.50 cm/s MV E/A ratio:  1.05 Dorn Ross MD Electronically signed by  Dorn Ross MD Signature Date/Time: 09/06/2023/5:17:58 PM    Final    CT CHEST ABDOMEN PELVIS WO CONTRAST Result Date: 09/05/2023 CLINICAL DATA:  B-cell lymphoma, recent therapy. Chest pain for 2 days. Nausea and shortness of breath. * Tracking Code: BO * EXAM: CT CHEST, ABDOMEN AND PELVIS WITHOUT CONTRAST TECHNIQUE: Multidetector CT imaging of the chest, abdomen and pelvis was performed following the standard protocol without IV contrast. RADIATION DOSE REDUCTION: This exam was performed according to the departmental dose-optimization program which includes automated exposure control, adjustment of the mA and/or kV according to patient size and/or use of iterative reconstruction technique. COMPARISON:  PET-CT 08/11/2023 FINDINGS: CT CHEST FINDINGS Cardiovascular: Right Port-A-Cath tip: Right atrium. Coronary, aortic arch, and branch vessel atherosclerotic vascular disease. Moderate cardiomegaly. Mediastinum/Nodes: Unremarkable Lungs/Pleura: Moderate right and small left pleural effusion. Associated passive atelectasis in addition to mild atelectasis posteriorly in the right middle lobe and left upper lobe Musculoskeletal: Thoracic kyphosis and spondylosis. CT ABDOMEN PELVIS FINDINGS Hepatobiliary: Cholecystectomy.  Otherwise unremarkable. Pancreas: Unremarkable Spleen: Splenectomy. Adrenals/Urinary Tract: Small left adrenal adenoma unchanged. No other significant findings. Stomach/Bowel: Sigmoid colon diverticulosis. Scattered diverticula in the ascending colon. No active diverticulitis is identified. No dilated bowel. Vascular/Lymphatic: Atherosclerosis is present, including aortoiliac atherosclerotic disease. No pathologic adenopathy identified. Reproductive: Uterus absent Other: Small collection of fluid along the right paracolic gutter unchanged and not previously hypermetabolic. Musculoskeletal: 7 mm degenerative anterolisthesis at L3-4. Multilevel lumbar degenerative facet arthropathy. IMPRESSION: 1.  Moderate right and small left pleural effusions with associated passive atelectasis. 2. Moderate cardiomegaly. 3. Small left adrenal adenoma unchanged. 4. Colonic diverticulosis. 5. 7 mm degenerative anterolisthesis at L3-4. Multilevel lumbar degenerative facet arthropathy. 6.  Aortic Atherosclerosis (ICD10-I70.0). Electronically Signed   By: Ryan Salvage M.D.   On: 09/05/2023 16:53   DG Chest 2 View Result Date: 09/05/2023 CLINICAL DATA:  Chest pain history of B-cell lymphoma EXAM: CHEST - 2 VIEW COMPARISON:  X-ray  05/24/2023. FINDINGS: Underinflation. Enlarged cardiopericardial silhouette with calcified aorta. New small pleural effusions adjacent lung opacities. Recommend follow-up. No pneumothorax or edema. Right IJ chest port with tip overlying the upper right atrium. Overlapping cardiac leads. Surgical clips in the upper abdomen. Diffuse degenerative changes. IMPRESSION: Underinflation with small pleural effusions and adjacent opacities. Recommend follow-up. Chest port. Enlarged cardiopericardial silhouette. Electronically Signed   By: Ranell Bring M.D.   On: 09/05/2023 13:49    Microbiology: Recent Results (from the past 240 hours)  Resp panel by RT-PCR (RSV, Flu A&B, Covid) Anterior Nasal Swab     Status: None   Collection Time: 09/05/23  4:33 PM   Specimen: Anterior Nasal Swab  Result Value Ref Range Status   SARS Coronavirus 2 by RT PCR NEGATIVE NEGATIVE Final    Comment: (NOTE) SARS-CoV-2 target nucleic acids are NOT DETECTED.  The SARS-CoV-2 RNA is generally detectable in upper respiratory specimens during the acute phase of infection. The lowest concentration of SARS-CoV-2 viral copies this assay can detect is 138 copies/mL. A negative result does not preclude SARS-Cov-2 infection and should not be used as the sole basis for treatment or other patient management decisions. A negative result may occur with  improper specimen collection/handling, submission of specimen  other than nasopharyngeal swab, presence of viral mutation(s) within the areas targeted by this assay, and inadequate number of viral copies(<138 copies/mL). A negative result must be combined with clinical observations, patient history, and epidemiological information. The expected result is Negative.  Fact Sheet for Patients:  BloggerCourse.com  Fact Sheet for Healthcare Providers:  SeriousBroker.it  This test is no t yet approved or cleared by the [place]  FDA and  has been authorized for detection and/or diagnosis of SARS-CoV-2 by FDA under an Emergency Use Authorization (EUA). This EUA will remain  in effect (meaning this test can be used) for the duration of the COVID-19 declaration under Section 564(b)(1) of the Act, 21 U.S.C.section 360bbb-3(b)(1), unless the authorization is terminated  or revoked sooner.       Influenza A by PCR NEGATIVE NEGATIVE Final   Influenza B by PCR NEGATIVE NEGATIVE Final    Comment: (NOTE) The Xpert Xpress SARS-CoV-2/FLU/RSV plus assay is intended as an aid in the diagnosis of influenza from Nasopharyngeal swab specimens and should not be used as a sole basis for treatment. Nasal washings and aspirates are unacceptable for Xpert Xpress SARS-CoV-2/FLU/RSV testing.  Fact Sheet for Patients: BloggerCourse.com  Fact Sheet for Healthcare Providers: SeriousBroker.it  This test is not yet approved or cleared by the [place]  FDA and has been authorized for detection and/or diagnosis of SARS-CoV-2 by FDA under an Emergency Use Authorization (EUA). This EUA will remain in effect (meaning this test can be used) for the duration of the COVID-19 declaration under Section 564(b)(1) of the Act, 21 U.S.C. section 360bbb-3(b)(1), unless the authorization is terminated or revoked.     Resp Syncytial Virus by PCR NEGATIVE NEGATIVE Final     Comment: (NOTE) Fact Sheet for Patients: BloggerCourse.com  Fact Sheet for Healthcare Providers: SeriousBroker.it  This test is not yet approved or cleared by the [place]  FDA and has been authorized for detection and/or diagnosis of SARS-CoV-2 by FDA under an Emergency Use Authorization (EUA). This EUA will remain in effect (meaning this test can be used) for the duration of the COVID-19 declaration under Section 564(b)(1) of the Act, 21 U.S.C. section 360bbb-3(b)(1), unless the authorization is terminated or revoked.  Performed at [facility], [address] [place], [place] [address]   MRSA Next Gen by PCR, Nasal     Status: None   Collection Time: 09/06/23  5:45 PM   Specimen: Nasal Mucosa; Nasal Swab  Result Value Ref Range Status   MRSA by PCR Next Gen NOT DETECTED NOT DETECTED Final    Comment: (NOTE) The GeneXpert MRSA Assay (FDA approved for NASAL specimens only), is one component of a comprehensive MRSA colonization surveillance program. It is not intended to diagnose MRSA infection nor to guide or monitor treatment for MRSA infections. Test performance is not FDA approved in patients less than 55 years old. Performed at [facility], [address] [place], [place] [address]      Labs: Basic Metabolic Panel: Recent Labs  Lab 09/08/23 0405 09/09/23 0430 09/09/23 0730 09/10/23 0500 09/11/23 0200 09/12/23 0310 09/13/23 0530 09/14/23 0555  NA 143 141   < > 143 137 138 135 136  K 3.9 3.8   < > 3.4* 3.6 3.4* 4.0 4.4  CL 113* 107   < > 106 103 102 103 103  CO2 22 24   < > 26 24 24 26 23   GLUCOSE 101* 127*   < > 121* 111* 138* 106* 119*  BUN 32* 24*   < > 19 21 23 22  25*  CREATININE 1.27* 1.15*   < > 1.25* 1.37* 1.43* 1.43* 1.38*  CALCIUM  8.3* 8.9   < > 8.6* 8.3* 8.8* 8.8* 9.1  MG 1.7 1.7  --  1.7 2.3  --   --   --    < > = values in this interval not displayed.   Liver Function Tests: Recent Labs  Lab  09/08/23 0405 09/09/23 0430 09/09/23 0730 09/10/23 0500 09/11/23 0200  AST 31 26 27 19 17   ALT 85* 74* 73* 51* 39  ALKPHOS 120 110 112 101 90  BILITOT 0.9 1.1 0.9 1.0 0.8  PROT 4.4* 5.0* 5.1* 4.7* 4.4*  ALBUMIN 2.7* 3.1* 3.1* 2.9* 2.8*   No results for input(s): LIPASE, AMYLASE in the last 168 hours. No results for input(s): AMMONIA in the last 168 hours. CBC: Recent Labs  Lab 09/08/23 0405 09/09/23 0430 09/10/23 0500 09/11/23 0200 09/12/23 0310  WBC 5.0 6.8 5.1 5.8 5.5  HGB 8.0* 9.3* 8.7* 8.0* 9.0*  HCT 25.6* 29.2* 27.3* 25.0* 28.0*  MCV 73.8* 72.6* 72.8* 72.3* 71.8*  PLT 331 373 371 330 399   Cardiac Enzymes: No results for input(s): CKTOTAL, CKMB, CKMBINDEX, TROPONINI in the last 168 hours. BNP: BNP (last 3 results) Recent Labs    09/05/23 1410 09/07/23 0420  BNP >4,500.0* >4,500.0*    ProBNP (last 3 results) No results for input(s): PROBNP in the last 8760 hours.  CBG: No results for input(s): GLUCAP in the last 168 hours.     Signed:  Sigurd Pac MD.  [facility] 09/14/2023, 10:24 AM

## 2023-09-14 NOTE — Progress Notes (Addendum)
 Advanced Heart Failure Rounding Note  Cardiologist: None   Chief Complaint: Acute on chronic CHF w/ new biventricular failure  Subjective:    CO-OX 35% now off milrinone .   Feels good this morning. Sitting on EOB eating breakfast. Denies CP/SOB. Able to get around.   Objective:   Weight Range: 59.1 kg Body mass index is 25.45 kg/m.   Vital Signs:   Temp:  [97.6 F (36.4 C)-98.3 F (36.8 C)] 98.3 F (36.8 C) (07/23 0532) Pulse Rate:  [100-114] 100 (07/23 0532) Resp:  [17-20] 20 (07/23 0532) BP: (90-96)/(59-68) 94/68 (07/23 0532) SpO2:  [95 %-98 %] 96 % (07/23 0532) Weight:  [59.1 kg] 59.1 kg (07/23 0532) Last BM Date : 09/11/23  Weight change: Filed Weights   09/12/23 0500 09/13/23 0543 09/14/23 0532  Weight: 53.6 kg 53.3 kg 59.1 kg   Intake/Output:   Intake/Output Summary (Last 24 hours) at 09/14/2023 0756 Last data filed at 09/13/2023 1001 Gross per 24 hour  Intake 3 ml  Output --  Net 3 ml    Physical Exam  General:  elderly appearing.  No respiratory difficulty Neck: JVD ~7 cm.  Cor: Regular rate & rhythm. No murmurs. Lungs: clear Extremities: no edema  Neuro: alert & oriented x 3. Affect pleasant.  Telemetry   ST 110s (Personally reviewed)    Labs    CBC Recent Labs    09/12/23 0310  WBC 5.5  HGB 9.0*  HCT 28.0*  MCV 71.8*  PLT 399   Basic Metabolic Panel Recent Labs    92/77/74 0530 09/14/23 0555  NA 135 136  K 4.0 4.4  CL 103 103  CO2 26 23  GLUCOSE 106* 119*  BUN 22 25*  CREATININE 1.43* 1.38*  CALCIUM  8.8* 9.1   Liver Function Tests No results for input(s): AST, ALT, ALKPHOS, BILITOT, PROT, ALBUMIN in the last 72 hours.  No results for input(s): LIPASE, AMYLASE in the last 72 hours. Cardiac Enzymes No results for input(s): CKTOTAL, CKMB, CKMBINDEX, TROPONINI in the last 72 hours.  BNP: BNP (last 3 results) Recent Labs    09/05/23 1410 09/07/23 0420  BNP >4,500.0* >4,500.0*    ProBNP  (last 3 results) No results for input(s): PROBNP in the last 8760 hours.   D-Dimer No results for input(s): DDIMER in the last 72 hours. Hemoglobin A1C No results for input(s): HGBA1C in the last 72 hours. Fasting Lipid Panel No results for input(s): CHOL, HDL, LDLCALC, TRIG, CHOLHDL, LDLDIRECT in the last 72 hours. Thyroid Function Tests No results for input(s): TSH, T4TOTAL, T3FREE, THYROIDAB in the last 72 hours.  Invalid input(s): FREET3  Other results:  Imaging   No results found.  Medications:   Scheduled Medications:  acyclovir   400 mg Oral BID   allopurinol   300 mg Oral Daily   apixaban   5 mg Oral BID   Chlorhexidine  Gluconate Cloth  6 each Topical Daily   digoxin   0.0625 mg Oral Daily   losartan   12.5 mg Oral QPM   sodium chloride  flush  3 mL Intravenous Q12H   spironolactone   25 mg Oral Daily    Infusions:    PRN Medications: acetaminophen  **OR** acetaminophen , bisacodyl , diphenhydrAMINE , guaiFENesin -dextromethorphan , HYDROcodone -acetaminophen , ondansetron  **OR** ondansetron  (ZOFRAN ) IV, mouth rinse, polyethylene glycol, sodium chloride  flush, traZODone   Patient Profile   89 y.o. 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 Admitted with acute on chronic  CHF with newly reduced EF w/ biventricular dysfunction and cardiogenic shock in setting of recent Adriamycin .   Assessment/Plan  A/C CHF with newly reduced EF and biventricular dysfunction/cardiogenic shock: -EF previously 45-50% -Echo 07/25: EF 20%, RV okay -CM most likely 2/2 Adriamycin . However, ECG with anterior Qs and hx chest pain on presentation.  -cMRI w/ LVEF 22% and RVEF 28%, nonspecific LGE, moderate MR -CO-OX 35%. Off milrinone . Failed milrinone  wean but symptomatically is stable.  - Volume okay - Continue spiro 25 mg daily - Continue losartan  12.5 mg daily -  Continue digoxin  0.0625 mg daily - Restart home Farxiga  10 mg daily -She is tenuous. Tachycardic with soft BP. Stable off milrinone  thought co-ox is low. Continue ongoing discussions with palliative care team. She is DNR.   2. B cell lymphoma - S/p RCHOP -PET from 06/25 with mild increase in hypermetabolic activity involving soft tissue mass in RLE  -Planning for radiation therapy in the future   3. AKI on CKD IIIa -Scr baseline around 1.1, Cr up to 1.9 on admit -Improved with inotrope support and diuresis   4. Elevated LFTs -Likely d/t passive congestion and cardiogenic shock -Improving.   5. Hx PE -On Eliquis   Stable for discharge today from AHF standpoint. Will arrange f/u in AHF clinic. Please sent meds to TOC. Plan for hospice services at discharge.   AHF meds at discharge:  Eliquis  5 mg BID Digoxin  0.0625 mg daily Losartan  12.5 mg at bedtime Spiro 25 mg daily Farxiga  10 mg daily Lasix  20 mg daily  Length of Stay: 9  Beckey LITTIE Coe, NP  09/14/2023, 7:56 AM  Advanced Heart Failure Team Pager [phone] (M-F; 7a - 5p)  Please contact [facility] for night-coverage after hours (5p -7a ) and weekends on [email or web address]

## 2023-09-14 NOTE — TOC Progression Note (Signed)
 Transition of Care [facility]) - Progression Note    Patient Details  Name: Leslie Williamson MRN: [id number] Date of Birth: November 11, 1931  [facility]) CM/SW Contact  Waddell Barnie Rama, RN Phone Number: 09/14/2023, 10:31 AM  Clinical Narrative:    NCM spoke with son, Lynwood , he states the DME has not been delivered yet to the home.  Once DME has been delivered he will call this NCM back to let know and he will transport patient home.    Expected Discharge Plan: Home w Hospice Care Barriers to Discharge: Continued Medical Work up               Expected Discharge Plan and Services In-house Referral: Clinical Social Work Discharge Planning Services: CM Consult Post Acute Care Choice: Hospice Living arrangements for the past 2 months: Single Family Home Expected Discharge Date: 09/14/23               DME Arranged: 3-N-1 DME Agency: [facility] Date DME Agency Contacted: 09/09/23 Time DME Agency Contacted: [phone] Representative spoke with at DME Agency: [facility] HH Arranged: RN HH Agency: [facility]) Date [facility] Agency Contacted: 09/13/23 Time HH Agency Contacted: 1238 Representative spoke with at [facility] Agency: Darleene Gowda   Social Drivers of Health (SDOH) Interventions SDOH Screenings   Food Insecurity: No Food Insecurity (09/05/2023)  Housing: Low Risk  (09/05/2023)  Transportation Needs: No Transportation Needs (09/05/2023)  Utilities: Not At Risk (09/05/2023)  Alcohol  Screen: Low Risk  (12/31/2019)  Depression (PHQ2-9): Low Risk  (08/19/2023)  Financial Resource Strain: Low Risk  (12/31/2019)  Physical Activity: Inactive (12/31/2019)  Social Connections: Moderately Isolated (09/05/2023)  Stress: No Stress Concern Present (12/31/2019)  Tobacco Use: Medium Risk (09/06/2023)    Readmission Risk Interventions    09/07/2023    8:35 AM 07/05/2022    1:10 PM 07/05/2022    1:09 PM  Readmission Risk Prevention Plan  Transportation  Screening Complete  Complete  PCP or Specialist Appt within 3-5 Days  Complete   Home Care Screening Complete    Medication Review (RN CM) Complete    HRI or Home Care Consult  Complete   Social Work Consult for Recovery Care Planning/Counseling  Complete   Palliative Care Screening  Not Applicable   Medication Review [employer])  Complete

## 2023-09-15 ENCOUNTER — Inpatient Hospital Stay

## 2023-09-15 ENCOUNTER — Inpatient Hospital Stay: Admitting: Hematology

## 2023-09-15 NOTE — Transitions of Care (Post Inpatient/ED Visit) (Signed)
 09/15/2023  Patient ID: Leslie Williamson, female   DOB: 01/08/1932, 89 y.o.   MRN: [id number]  Chart Review for transitions of care. Patient discharged with hospice.  Called [facility] spoke with Leslie Williamson. Patient is active.    Kaeden Mester J. Leylah Tarnow RN, MSN [facility], [facility] RN Care Manager Direct Dial: [phone]  Fax: [phone] Website: [email or web address]

## 2023-09-22 ENCOUNTER — Encounter: Payer: Self-pay | Admitting: Student

## 2023-09-22 ENCOUNTER — Ambulatory Visit: Attending: Student | Admitting: Student

## 2023-09-22 VITALS — BP 100/58 | HR 99 | Ht 60.0 in | Wt 124.8 lb

## 2023-09-22 DIAGNOSIS — E782 Mixed hyperlipidemia: Secondary | ICD-10-CM | POA: Diagnosis not present

## 2023-09-22 DIAGNOSIS — I5020 Unspecified systolic (congestive) heart failure: Secondary | ICD-10-CM

## 2023-09-22 DIAGNOSIS — Z79899 Other long term (current) drug therapy: Secondary | ICD-10-CM | POA: Diagnosis not present

## 2023-09-22 DIAGNOSIS — N1832 Chronic kidney disease, stage 3b: Secondary | ICD-10-CM

## 2023-09-22 DIAGNOSIS — I10 Essential (primary) hypertension: Secondary | ICD-10-CM | POA: Diagnosis not present

## 2023-09-22 DIAGNOSIS — C8330 Diffuse large B-cell lymphoma, unspecified site: Secondary | ICD-10-CM

## 2023-09-22 DIAGNOSIS — Z86711 Personal history of pulmonary embolism: Secondary | ICD-10-CM

## 2023-09-22 MED ORDER — APIXABAN 5 MG PO TABS: 5.0000 mg | ORAL_TABLET | Freq: Two times a day (BID) | ORAL | Status: DC

## 2023-09-22 NOTE — Patient Instructions (Signed)
 Medication Instructions:  Your physician recommends that you continue on your current medications as directed. Please refer to the Current Medication list given to you today.  *If you need a refill on your cardiac medications before your next appointment, please call your pharmacy*  Lab Work: We will call you regarding having labs done.   If you have labs (blood work) drawn today and your tests are completely normal, you will receive your results only by: MyChart Message (if you have MyChart) OR A paper copy in the mail If you have any lab test that is abnormal or we need to change your treatment, we will call you to review the results.  Testing/Procedures: NONE   Follow-Up: At [facility], you and your health needs are our priority.  As part of our continuing mission to provide you with exceptional heart care, our providers are all part of one team.  This team includes your primary Cardiologist (physician) and Advanced Practice Providers or APPs (Physician Assistants and Nurse Practitioners) who all work together to provide you with the care you need, when you need it.  Your next appointment:   6 -8 week(s)  Provider:   You may see Vina Gull, MD or one of the following Advanced Practice Providers on your designated Care Team:   Laymon Qua, PA-C  [place], [place] Olivia Pavy, [place]     We recommend signing up for the patient portal called MyChart.  Sign up information is provided on this After Visit Summary.  MyChart is used to connect with patients for Virtual Visits (Telemedicine).  Patients are able to view lab/test results, encounter notes, upcoming appointments, etc.  Non-urgent messages can be sent to your provider as well.   To learn more about what you can do with MyChart, go to [email or web address].   Other Instructions Thank you for choosing [facility]!

## 2023-09-22 NOTE — Progress Notes (Addendum)
 Cardiology Office Note    Date:  09/22/2023  ID:  Leslie, Williamson 05/27/1931, MRN [id number] Cardiologist: Vina Gull, MD { :  History of Present Illness:    Leslie Williamson is a 89 y.o. female with past medical history of multiple myeloma, lymphoma, HTN, HLD, chronic HFpEF and history of PE who presents to the office today for hospital follow-up.  She was most recently admitted to [facility] on 09/05/2023 for evaluation of worsening shortness of breath and lower extremity edema. Was found to have an acute CHF exacerbation with BNP greater than 4500. Repeat echocardiogram showed her EF was significantly reduced at 20% and she was found to be in cardiogenic shock with elevated lactic acid and Co-ox at 28%. She was started on IV Milrinone  and overall felt to be a poor candidate for advanced therapies given her age and lymphoma. She was transferred to [facility] for Advanced Heart Failure evaluation and her cardiomyopathy was felt to be likely related to Adriamycin  toxicity from R-CHOP regimen. She did undergo a cardiac MRI which showed her LVEF was at 22% and RV function was at 28%. She did have a nonspecific scar pattern often seen in the setting of elevated filling pressures and moderate MR.  Milrinone  was weaned during admission and it was not recommended to pursue home inotropes. Palliative care was consulted and home hospice was arranged. GDMT was limited given her soft BP and she was discharged on Digoxin  0.0625 mg daily, Eliquis  5 mg twice daily, Lasix  20 mg daily, Losartan  12.5 mg daily and Spironolactone  25 mg daily.  In talking with the patient and her son today, they report she has overall been doing well since her hospitalization.  Home weights have been stable with no acute changes. They are trying to limit her sodium intake. She denies any specific dyspnea on exertion but is not overly active at baseline. Mostly in the house during the day. No specific orthopnea, PND, pitting edema  or chest pain. Does report having occasional palpitations which only last for a few seconds and spontaneously resolve.   Studies Reviewed:   EKG: EKG is not ordered today.   Limited Echo: 08/2023 IMPRESSIONS     1. Left ventricular ejection fraction, by estimation, is 20%. The left  ventricle has severely decreased function. The left ventricle demonstrates  global hypokinesis. The average left ventricular global longitudinal  strain is -4.2 %. The global  longitudinal strain is abnormal.   2. Right ventricular systolic function is normal. The right ventricular  size is normal.   3. The inferior vena cava is dilated in size with <50% respiratory  variability, suggesting right atrial pressure of 15 mmHg.   Cardiac MRI: 08/2023 IMPRESSION: 1.  Mild LV dilatation with severe systolic dysfunction (EF 22%)   2.  Mild RV dilatation with severe systolic dysfunction (EF 28%)   3. RV insertion site LGE, which is a nonspecific scar pattern often seen in setting of elevated pulmonary pressures   4.  Moderate mitral regurgitation (regurgitant fraction 35%)   Physical Exam:   VS:  BP (!) 100/58 (BP Location: Right Arm)   Pulse 99   Ht 5' (1.524 m)   Wt 124 lb 12.8 oz (56.6 kg)   SpO2 97%   BMI 24.37 kg/m    Wt Readings from Last 3 Encounters:  09/22/23 124 lb 12.8 oz (56.6 kg)  09/14/23 130 lb 4.7 oz (59.1 kg)  08/25/23 126 lb 1.6 oz (57.2 kg)  GEN: Pleasant, elderly female appearing in no acute distress NECK: No JVD; No carotid bruits CARDIAC: RRR, no murmurs, rubs, gallops RESPIRATORY:  Clear to auscultation without rales, wheezing or rhonchi  ABDOMEN: Appears non-distended. No obvious abdominal masses. EXTREMITIES: No clubbing or cyanosis. Trace ankle edema bilaterally.  Distal pedal pulses are 2+ bilaterally.   Assessment and Plan:   1. HFrEF (heart failure with reduced ejection fraction) (HCC) - Recently diagnosed with biventricular failure and cardiogenic shock as  echo showed her EF was at 20% and cardiomyopathy felt to be due to Adriamycin  toxicity from R-CHOP regimen. Was sent home with Home Hospice.  - She is overall doing well given her recent illness and denies any recurrent respiratory issues. We reviewed her status is tenuous in the setting of her biventricular failure, but she has overall been doing well on her current medical therapy.  - For now, continue Digoxin  0.0625 mg daily, Lasix  20 mg daily, Losartan  12.5 mg daily, Toprol -XL 12.5mg  daily and Spironolactone  25 mg daily. BP does not allow for switching from Losartan  to Entresto. Will recheck labs to include CBC, BMET and Digoxin  Level. She does have a port and reports having significant difficulty with regular lab draws in the past. I reached out to Oncology and they are willing to check labs tomorrow via her port.   2. Essential hypertension - BP is at 100/58 during today's visit. She denies any symptoms at this time. Continue current medications with Lasix  20 mg daily, Toprol -XL 12.5mg  daily, Losartan  12.5 mg daily and Spironolactone  25 mg daily.  3. Mixed hyperlipidemia - Followed by her PCP. Remains on Atorvastatin  40mg  daily.   4. Diffuse large B-cell lymphoma, unspecified body region [facility]) - Followed by Oncology. Completed 6 cycles of R-CHOP. The patient and family are waiting to see how she overall does from her recent illness before deciding on radiation.   5. History of pulmonary embolus (PE) - No reports of active bleeding. She is on Eliquis  5mg  BID for anticoagulation. If weight remains less than 133 lbs, would recommend reducing her dose to 2.5mg  BID since she would have 2 out of 3 indications for reduced dosing. Also unclear why she is on ASA but she does not have specific cardiac indications for this. Will recheck a CBC with upcoming labs.   6. Stage 3 CKD - Baseline creatinine 1.2 - 1.3. At 1.38 upon hospital discharge. Will recheck a BMET with upcoming labs.   Signed, Laymon CHRISTELLA Qua, PA-C

## 2023-09-23 ENCOUNTER — Ambulatory Visit: Payer: Self-pay | Admitting: Student

## 2023-09-23 ENCOUNTER — Inpatient Hospital Stay: Attending: Hematology

## 2023-09-23 ENCOUNTER — Encounter: Payer: Self-pay | Admitting: Hematology

## 2023-09-23 DIAGNOSIS — Z452 Encounter for adjustment and management of vascular access device: Secondary | ICD-10-CM | POA: Diagnosis not present

## 2023-09-23 DIAGNOSIS — I5020 Unspecified systolic (congestive) heart failure: Secondary | ICD-10-CM | POA: Diagnosis not present

## 2023-09-23 DIAGNOSIS — C8335 Diffuse large B-cell lymphoma, lymph nodes of inguinal region and lower limb: Secondary | ICD-10-CM | POA: Insufficient documentation

## 2023-09-23 DIAGNOSIS — Z79899 Other long term (current) drug therapy: Secondary | ICD-10-CM

## 2023-09-23 LAB — BASIC METABOLIC PANEL WITH GFR
Anion gap: 11 (ref 5–15)
BUN: 29 mg/dL — ABNORMAL HIGH (ref 8–23)
CO2: 19 mmol/L — ABNORMAL LOW (ref 22–32)
Calcium: 8.4 mg/dL — ABNORMAL LOW (ref 8.9–10.3)
Chloride: 109 mmol/L (ref 98–111)
Creatinine, Ser: 1.37 mg/dL — ABNORMAL HIGH (ref 0.44–1.00)
GFR, Estimated: 37 mL/min — ABNORMAL LOW (ref >60)
Glucose, Bld: 99 mg/dL (ref 70–99)
Potassium: 4.3 mmol/L (ref 3.5–5.1)
Sodium: 139 mmol/L (ref 135–145)

## 2023-09-23 LAB — CBC
HCT: 27.5 % — ABNORMAL LOW (ref 36.0–46.0)
Hemoglobin: 8.5 g/dL — ABNORMAL LOW (ref 12.0–15.0)
MCH: 22.5 pg — ABNORMAL LOW (ref 26.0–34.0)
MCHC: 30.9 g/dL (ref 30.0–36.0)
MCV: 72.9 fL — ABNORMAL LOW (ref 80.0–100.0)
Platelets: 462 K/uL — ABNORMAL HIGH (ref 150–400)
RBC: 3.77 MIL/uL — ABNORMAL LOW (ref 3.87–5.11)
RDW: 21.1 % — ABNORMAL HIGH (ref 11.5–15.5)
WBC: 4.8 K/uL (ref 4.0–10.5)
nRBC: 6.3 % — ABNORMAL HIGH (ref 0.0–0.2)

## 2023-09-23 LAB — DIGOXIN LEVEL: Digoxin Level: 1.3 ng/mL (ref 0.8–2.0)

## 2023-09-23 MED ORDER — SODIUM CHLORIDE 0.9% FLUSH
10.0000 mL | Freq: Once | INTRAVENOUS | Status: AC
  Administered 2023-09-23: 10 mL

## 2023-09-23 MED ORDER — HEPARIN SOD (PORK) LOCK FLUSH 100 UNIT/ML IV SOLN
500.0000 [IU] | Freq: Once | INTRAVENOUS | Status: AC
  Administered 2023-09-23: 500 [IU] via INTRAVENOUS

## 2023-09-23 NOTE — Progress Notes (Signed)
 Leslie Williamson presented for Portacath access and flush.  Portacath located right chest wall accessed with  H 20 needle.  Good blood return present. Portacath flushed with 20ml NS and 500U/35ml Heparin  and needle removed intact.  Discharged from clinic by wheel chair in stable condition. Alert and oriented x 3. F/U with [facility] as scheduled.

## 2023-09-28 ENCOUNTER — Encounter (HOSPITAL_COMMUNITY)

## 2023-11-10 NOTE — Progress Notes (Unsigned)
 Cardiology Office Note    Date:  11/11/2023  ID:  Dameisha, Tschida 1931/06/05, MRN [id number] Cardiologist: Vina Gull, MD Cardiology APP:  Johnson Laymon HERO, PA-C { : History of Present Illness:    LANEAH LUFT is a 89 y.o. female with past medical history of chronic HFrEF (EF 20% by echo in 08/2023 and cardiomyopathy felt to be due to Adriamycin  toxicity), multiple myeloma, lymphoma, HTN, HLD and history of PE who presents to the office today for 6-week follow-up.  She was last examined by myself in 08/2023 following a recent hospitalization for cardiogenic shock during which EF was found to be at 20%. Her cardiomyopathy was felt to be related to Adriamycin  from R-CHOP regimen and cardiac MRI showed a nonspecific scar pattern with moderate MR. Palliative care was consulted and hospice was arranged. At the time of follow-up, the patient and her son reported that she had overall been doing well and her breathing had improved. It was reviewed that her status was tenuous given biventricular failure and she was continued on her current medical therapy with Digoxin  0.0625 mg daily, Lasix  20 mg daily, Losartan  12.5 mg daily, Toprol -XL 12.5mg  daily and Spironolactone  25 mg daily.  Follow-up labs were obtained and her hemoglobin remained low at 8.5 but overall similar to prior values. Digoxin  level had increased to 1.3 and given her age and renal function, this was discontinued. Hematology had previously recommended to continue Eliquis  indefinitely given her history of PE and this was restarted with ASA being discontinued.  In talking with the patient and her son today, they report that she has experienced worsening lower extremity edema and abdominal distention since her last office visit. Symptoms have worsened over the past month. Her weight has increased by 15 pounds on the office scale since 08/2023. She reports having dyspnea at rest and with activity. Also notes more orthopnea and PND. She  is being followed by Hospice and they did increase her Lasix  from 20 mg daily to 40 mg daily last week but she has not noticed improvement in symptoms. Her son has been encouraging her to elevate her lower extremities and they are trying to limit her sodium intake. She consumes at least 40+ ounces of water a day.   Studies Reviewed:   EKG: EKG is not ordered today.  Limited Echo: 08/2023 IMPRESSIONS     1. Left ventricular ejection fraction, by estimation, is 20%. The left  ventricle has severely decreased function. The left ventricle demonstrates  global hypokinesis. The average left ventricular global longitudinal  strain is -4.2 %. The global  longitudinal strain is abnormal.   2. Right ventricular systolic function is normal. The right ventricular  size is normal.   3. The inferior vena cava is dilated in size with <50% respiratory  variability, suggesting right atrial pressure of 15 mmHg.   cMRI: 08/2023 IMPRESSION: 1.  Mild LV dilatation with severe systolic dysfunction (EF 22%)   2.  Mild RV dilatation with severe systolic dysfunction (EF 28%)   3. RV insertion site LGE, which is a nonspecific scar pattern often seen in setting of elevated pulmonary pressures   4.  Moderate mitral regurgitation (regurgitant fraction 35%)   Physical Exam:   VS:  BP 118/60 (BP Location: Left Arm, Cuff Size: Normal)   Pulse 80   Ht 5' (1.524 m)   Wt 139 lb (63 kg)   BMI 27.15 kg/m    Wt Readings from Last 3 Encounters:  11/11/23 139  lb (63 kg)  09/22/23 124 lb 12.8 oz (56.6 kg)  09/14/23 130 lb 4.7 oz (59.1 kg)     GEN: Frail, elderly female appearing in no acute distress. Sitting in wheelchair.  NECK: No carotid bruits CARDIAC: RRR, 2/6 SEM along RUSB.  RESPIRATORY: Rales along bases bilaterally ABDOMEN: Appears non-distended. No obvious abdominal masses. EXTREMITIES: No clubbing or cyanosis. 2+ pitting edema bilaterally.  Distal pedal pulses are 2+ bilaterally.   Assessment  and Plan:   1. HFrEF (heart failure with reduced ejection fraction) (HCC) - Echocardiogram in 08/2023 showed her EF was at 20% and cardiomyopathy was felt to be due to Adriamycin  toxicity. She is significantly volume overloaded by examination today and suspect this is due to progressive heart failure. Being followed by Hospice and therefore trying to avoid admission. Lasix  was increased from 20 mg daily to 40 mg daily last week. Will increase to 40 mg twice daily. Encouraged her son to reach out next week to let us  know how her symptoms are responding and if weight has changed. If no improvement, could switch to Torsemide for improved bioavailability. Will reach out to [facility] to see if IV Lasix  is an option. Would be for palliation at this point to help with her respiratory status. Will also update them on the Lasix  dose change as her son reports this is provided by them. Continue Farxiga  10 mg daily, Losartan  12.5 mg daily (BP has not allowed for switching to Entresto), Toprol -XL 12.5 mg daily and Spironolactone  25 mg daily.  Addendum: Reached out to [facility] and they are aware of the dose adjustment. No options for IV or sub-Q Lasix  from their perspective since considered more aggressive therapy. They will recheck a BMET next week and drop off the sample at [facility].    2. Essential hypertension - BP is well-controlled at 118/60 during today's visit. Continue current medical therapy with Losartan  12.5 mg daily, Toprol -XL 12.5 mg daily and Spironolactone  25 mg daily.  3. Mixed hyperlipidemia - Followed by her PCP.  Remains on Atorvastatin  40 mg daily.  4. Diffuse large B-cell lymphoma, unspecified body region [facility]) - Previously completed 6 cycles of R-CHOP and followed by Oncology. No longer pursuing treatment given that she is on Hospice.  5. History of pulmonary embolus (PE) - Oncology previously recommend to continue anticoagulation indefinitely. She is on Eliquis  5 mg twice daily  for anticoagulation which is the appropriate dose for now as her only indication for reduced dosing is age. Weight greater than 133 lbs and creatinine at 1.37 by most recent labs.   6. CKD stage 3b, GFR 30-44 ml/min (HCC) - Creatinine was at 1.37 when checked in 09/2023 which is close to her known baseline. Will recheck BMET next week.  Signed, Laymon CHRISTELLA Qua, PA-C

## 2023-11-11 ENCOUNTER — Encounter: Payer: Self-pay | Admitting: Student

## 2023-11-11 ENCOUNTER — Ambulatory Visit: Attending: Student | Admitting: Student

## 2023-11-11 VITALS — BP 118/60 | HR 80 | Ht 60.0 in | Wt 139.0 lb

## 2023-11-11 DIAGNOSIS — C8330 Diffuse large B-cell lymphoma, unspecified site: Secondary | ICD-10-CM | POA: Diagnosis not present

## 2023-11-11 DIAGNOSIS — Z86711 Personal history of pulmonary embolism: Secondary | ICD-10-CM

## 2023-11-11 DIAGNOSIS — E782 Mixed hyperlipidemia: Secondary | ICD-10-CM

## 2023-11-11 DIAGNOSIS — I10 Essential (primary) hypertension: Secondary | ICD-10-CM | POA: Diagnosis not present

## 2023-11-11 DIAGNOSIS — N1832 Chronic kidney disease, stage 3b: Secondary | ICD-10-CM

## 2023-11-11 DIAGNOSIS — I5020 Unspecified systolic (congestive) heart failure: Secondary | ICD-10-CM

## 2023-11-11 MED ORDER — FUROSEMIDE 40 MG PO TABS: 40.0000 mg | ORAL_TABLET | Freq: Two times a day (BID) | ORAL | 11 refills | Status: DC

## 2023-11-11 NOTE — Patient Instructions (Signed)
 Medication Instructions:   Increase Lasix  to 40 mg Two Times Daily   *If you need a refill on your cardiac medications before your next appointment, please call your pharmacy*  Lab Work: NONE   If you have labs (blood work) drawn today and your tests are completely normal, you will receive your results only by: MyChart Message (if you have MyChart) OR A paper copy in the mail If you have any lab test that is abnormal or we need to change your treatment, we will call you to review the results.  Testing/Procedures: NONE   Follow-Up: At [facility], you and your health needs are our priority.  As part of our continuing mission to provide you with exceptional heart care, our providers are all part of one team.  This team includes your primary Cardiologist (physician) and Advanced Practice Providers or APPs (Physician Assistants and Nurse Practitioners) who all work together to provide you with the care you need, when you need it.  Your next appointment:   4 -6 week(s)  Provider:   You may see Vina Gull, MD or one of the following Advanced Practice Providers on your designated Care Team:   Laymon Qua, PA-C  [place], [place] Olivia P[place], [place]     We recommend signing up for the patient portal called MyChart.  Sign up information is provided on this After Visit Summary.  MyChart is used to connect with patients for Virtual Visits (Telemedicine).  Patients are able to view lab/test results, encounter notes, upcoming appointments, etc.  Non-urgent messages can be sent to your provider as well.   To learn more about what you can do with MyChart, go to [email or web address].   Other Instructions Thank you for choosing [facility]!

## 2023-11-18 ENCOUNTER — Encounter (HOSPITAL_COMMUNITY)
Admission: RE | Admit: 2023-11-18 | Discharge: 2023-11-18 | Disposition: A | Discharge status: Home / Self Care | Source: Ambulatory Visit | Attending: Cardiology | Admitting: Cardiology

## 2023-11-18 DIAGNOSIS — I5022 Chronic systolic (congestive) heart failure: Secondary | ICD-10-CM | POA: Diagnosis not present

## 2023-11-18 DIAGNOSIS — Z79899 Other long term (current) drug therapy: Secondary | ICD-10-CM | POA: Diagnosis not present

## 2023-11-18 LAB — BASIC METABOLIC PANEL WITH GFR
Anion gap: 13 (ref 5–15)
BUN: 30 mg/dL — ABNORMAL HIGH (ref 8–23)
CO2: 23 mmol/L (ref 22–32)
Calcium: 8.3 mg/dL — ABNORMAL LOW (ref 8.9–10.3)
Chloride: 103 mmol/L (ref 98–111)
Creatinine, Ser: 1.53 mg/dL — ABNORMAL HIGH (ref 0.44–1.00)
GFR, Estimated: 32 mL/min — ABNORMAL LOW (ref >60)
Glucose, Bld: 129 mg/dL — ABNORMAL HIGH (ref 70–99)
Potassium: 2.8 mmol/L — ABNORMAL LOW (ref 3.5–5.1)
Sodium: 139 mmol/L (ref 135–145)

## 2023-11-23 ENCOUNTER — Other Ambulatory Visit: Payer: Self-pay | Admitting: Student

## 2023-11-23 ENCOUNTER — Telehealth: Payer: Self-pay | Admitting: Student

## 2023-11-23 MED ORDER — POTASSIUM CHLORIDE CRYS ER 20 MEQ PO TBCR: 20.0000 meq | EXTENDED_RELEASE_TABLET | Freq: Every day | ORAL | 3 refills | Status: DC

## 2023-11-23 NOTE — Telephone Encounter (Signed)
   Notified by Hospice that the patient's potassium was at 2.8 by recent labs (these were not routed to us  after being obtained). The nurse practitioner with Hospice did order 20 mEq of potassium and I did recommend that she start 20 mEq daily while on the higher dose of Lasix . Called the patient's son and reviewed this with him as well. He does report her lower extremity edema has significantly improved with the dose adjustment. Will plan for a follow-up BMET in 1-2 weeks and this can be obtained by Hospice (they do not routinely obtain labs but are willing to recheck).   Signed, Laymon CHRISTELLA Qua, PA-C 11/23/2023, 5:09 PM Pager: [phone]

## 2023-12-07 ENCOUNTER — Encounter (INDEPENDENT_AMBULATORY_CARE_PROVIDER_SITE_OTHER): Payer: Self-pay | Admitting: Gastroenterology

## 2023-12-08 NOTE — Progress Notes (Deleted)
 Cardiology Office Note    Date:  12/08/2023  ID:  Leslie Williamson 07-30-1931, MRN [id number] Cardiologist: Vina Gull, MD Cardiology APP:  Johnson Laymon HERO, PA-C { :  History of Present Illness:    Leslie Williamson is a 89 y.o. female  with past medical history of chronic HFrEF (EF 20% by echo in 08/2023 and cardiomyopathy felt to be due to Adriamycin  toxicity), multiple myeloma, lymphoma, HTN, HLD and history of PE who presents to the office today for 1 month follow-up.  She was last examined by myself in 10/2023 and had been experiencing worsening lower extremity edema and abdominal distention with her weight had increased by 15 pounds since 08/2023.  Reported worsening dyspnea on exertion, orthopnea and PND.  Was being followed by hospice and Lasix  had recently been increased from 20 mg daily to 40 mg daily with no improvement in symptoms.  It was recommended to titrate Lasix  to 40 mg twice daily to help with her respiratory status and she was continued on Farxiga  10 mg daily, losartan  12.5 mg daily, Toprol -XL 12.5 mg daily and spironolactone  25 mg daily.  Digoxin  was previously discontinued given elevated digoxin  level.  Follow-up labs did show her K+ was low at 2.8 and she was started on potassium supplementation with plans for a repeat BMET in 1-2 weeks.   - BMET!  ROS: ***  Studies Reviewed:   EKG: EKG is*** ordered today and demonstrates ***   EKG Interpretation Date/Time:    Ventricular Rate:    PR Interval:    QRS Duration:    QT Interval:    QTC Calculation:   R Axis:      Text Interpretation:         Limited Echo: 08/2023 IMPRESSIONS     1. Left ventricular ejection fraction, by estimation, is 20%. The left  ventricle has severely decreased function. The left ventricle demonstrates  global hypokinesis. The average left ventricular global longitudinal  strain is -4.2 %. The global  longitudinal strain is abnormal.   2. Right ventricular systolic  function is normal. The right ventricular  size is normal.   3. The inferior vena cava is dilated in size with <50% respiratory  variability, suggesting right atrial pressure of 15 mmHg.   cMRI: 08/2023 IMPRESSION: 1.  Mild LV dilatation with severe systolic dysfunction (EF 22%)   2.  Mild RV dilatation with severe systolic dysfunction (EF 28%)   3. RV insertion site LGE, which is a nonspecific scar pattern often seen in setting of elevated pulmonary pressures   4.  Moderate mitral regurgitation (regurgitant fraction 35%)  Risk Assessment/Calculations:   {Does this patient have ATRIAL FIBRILLATION?:[id number]} No BP recorded.  {Refresh Note OR Click here to enter BP  :1}***         Physical Exam:   VS:  There were no vitals taken for this visit.   Wt Readings from Last 3 Encounters:  11/11/23 139 lb (63 kg)  09/22/23 124 lb 12.8 oz (56.6 kg)  09/14/23 130 lb 4.7 oz (59.1 kg)     GEN: Well nourished, well developed in no acute distress NECK: No JVD; No carotid bruits CARDIAC: ***RRR, no murmurs, rubs, gallops RESPIRATORY:  Clear to auscultation without rales, wheezing or rhonchi  ABDOMEN: Appears non-distended. No obvious abdominal masses. EXTREMITIES: No clubbing or cyanosis. No edema.  Distal pedal pulses are 2+ bilaterally.   Assessment and Plan:   1. HFrEF (heart failure with reduced ejection fraction) (HCC) -  She has a known cardiomyopathy with EF at 20% by most recent imaging and felt to be due to Adriamycin  toxicity. ***  2. Essential hypertension - BP is at***during today's visit.  3. Mixed hyperlipidemia - Remains on Atorvastatin  40 mg daily and followed by PCP.  4. History of pulmonary embolus (PE) -Remains on Eliquis  5 mg twice daily for anticoagulation.  5. CKD stage 3b, GFR 30-44 ml/min (HCC) - Creatinine had trended up to 1.53 when checked on 11/18/2023 but this was in the setting of increased diuretic use.  K+ was at 2.8 and she was started on  potassium supplementation. ***  6. Diffuse large B-cell lymphoma, unspecified body region [facility]) - She is no longer pursuing treatment given that she is now on hospice.   Signed, Laymon CHRISTELLA Qua, PA-C

## 2023-12-09 ENCOUNTER — Ambulatory Visit: Attending: Student | Admitting: Student

## 2023-12-09 DIAGNOSIS — I5020 Unspecified systolic (congestive) heart failure: Secondary | ICD-10-CM

## 2023-12-09 DIAGNOSIS — C8330 Diffuse large B-cell lymphoma, unspecified site: Secondary | ICD-10-CM

## 2023-12-09 DIAGNOSIS — I10 Essential (primary) hypertension: Secondary | ICD-10-CM

## 2023-12-09 DIAGNOSIS — E782 Mixed hyperlipidemia: Secondary | ICD-10-CM

## 2023-12-09 DIAGNOSIS — Z86711 Personal history of pulmonary embolism: Secondary | ICD-10-CM

## 2023-12-09 DIAGNOSIS — N1832 Chronic kidney disease, stage 3b: Secondary | ICD-10-CM

## 2023-12-13 ENCOUNTER — Encounter: Payer: Self-pay | Admitting: Oncology

## 2023-12-14 ENCOUNTER — Ambulatory Visit: Admitting: Student

## 2024-02-06 NOTE — Progress Notes (Deleted)
 Cardiology Office Note    Date:  02/06/2024  ID:  Leslie, Williamson 03-12-1931, MRN [id number] Cardiologist: Vina Gull, MD Cardiology APP:  Johnson Laymon HERO, PA-C { :  History of Present Illness:    Leslie Williamson is a 89 y.o. female with past medical history of chronic HFrEF (EF 20% by echo in 08/2023 and cardiomyopathy felt to be due to Adriamycin  toxicity), multiple myeloma, lymphoma, HTN, HLD and history of PE who presents to the office today for 1 month follow-up.   She was last examined by myself in 10/2023 and had been experiencing worsening lower extremity edema and abdominal distention with her weight had increased by 15 pounds since 08/2023.  Reported worsening dyspnea on exertion, orthopnea and PND.  Was being followed by hospice and Lasix  had recently been increased from 20 mg daily to 40 mg daily with no improvement in symptoms.  It was recommended to titrate Lasix  to 40 mg twice daily to help with her respiratory status and she was continued on Farxiga  10 mg daily, losartan  12.5 mg daily, Toprol -XL 12.5 mg daily and spironolactone  25 mg daily.  Digoxin  was previously discontinued given elevated digoxin  level.  Follow-up labs did show her K+ was low at 2.8 and she was started on potassium supplementation with plans for a repeat BMET in 1-2 weeks.    - BMET!  ROS: ***  Studies Reviewed:   EKG: EKG is*** ordered today and demonstrates ***   EKG Interpretation Date/Time:    Ventricular Rate:    PR Interval:    QRS Duration:    QT Interval:    QTC Calculation:   R Axis:      Text Interpretation:         Limited Echo: 08/2023 IMPRESSIONS     1. Left ventricular ejection fraction, by estimation, is 20%. The left  ventricle has severely decreased function. The left ventricle demonstrates  global hypokinesis. The average left ventricular global longitudinal  strain is -4.2 %. The global  longitudinal strain is abnormal.   2. Right ventricular systolic  function is normal. The right ventricular  size is normal.   3. The inferior vena cava is dilated in size with <50% respiratory  variability, suggesting right atrial pressure of 15 mmHg.    cMRI: 08/2023 IMPRESSION: 1.  Mild LV dilatation with severe systolic dysfunction (EF 22%)   2.  Mild RV dilatation with severe systolic dysfunction (EF 28%)   3. RV insertion site LGE, which is a nonspecific scar pattern often seen in setting of elevated pulmonary pressures   4.  Moderate mitral regurgitation (regurgitant fraction 35%)    Risk Assessment/Calculations:   {Does this patient have ATRIAL FIBRILLATION?:([phone]} No BP recorded.  {Refresh Note OR Click here to enter BP  :1}***         Physical Exam:   VS:  There were no vitals taken for this visit.   Wt Readings from Last 3 Encounters:  11/11/23 139 lb (63 kg)  09/22/23 124 lb 12.8 oz (56.6 kg)  09/14/23 130 lb 4.7 oz (59.1 kg)     GEN: Well nourished, well developed in no acute distress NECK: No JVD; No carotid bruits CARDIAC: ***RRR, no murmurs, rubs, gallops RESPIRATORY:  Clear to auscultation without rales, wheezing or rhonchi  ABDOMEN: Appears non-distended. No obvious abdominal masses. EXTREMITIES: No clubbing or cyanosis. No edema.  Distal pedal pulses are 2+ bilaterally.   Assessment and Plan:   1. HFrEF (heart failure with  reduced ejection fraction) (HCC) - She has a known cardiomyopathy with EF at 20% by most recent imaging and felt to be due to Adriamycin  toxicity. ***   2. Essential hypertension - BP is at***during today's visit.   3. Mixed hyperlipidemia - Remains on Atorvastatin  40 mg daily and followed by PCP.   4. History of pulmonary embolus (PE) - Remains on Eliquis  5 mg twice daily for anticoagulation.   5. CKD stage 3b, GFR 30-44 ml/min (HCC) - Creatinine had trended up to 1.53 when checked on 11/18/2023 but this was in the setting of increased diuretic use.  K+ was at 2.8 and she was started  on potassium supplementation. ***   6. Diffuse large B-cell lymphoma, unspecified body region [facility]) - She is no longer pursuing treatment given that she is now on hospice.   Signed, Laymon CHRISTELLA Qua, PA-C

## 2024-02-08 ENCOUNTER — Ambulatory Visit: Admitting: Student

## 2024-02-23 DEATH — deceased
